# Patient Record
Sex: Female | Born: 1940 | Race: White | Hispanic: No | Marital: Married | Smoking: Never smoker
Health system: Southern US, Community
[De-identification: ages and names within clinical notes are randomized; demographics above are authoritative.]

## PROBLEM LIST (undated history)

## (undated) DIAGNOSIS — F419 Anxiety disorder, unspecified: Secondary | ICD-10-CM

## (undated) DIAGNOSIS — M1990 Unspecified osteoarthritis, unspecified site: Secondary | ICD-10-CM

## (undated) DIAGNOSIS — I639 Cerebral infarction, unspecified: Secondary | ICD-10-CM

## (undated) DIAGNOSIS — K219 Gastro-esophageal reflux disease without esophagitis: Secondary | ICD-10-CM

## (undated) DIAGNOSIS — Z8042 Family history of malignant neoplasm of prostate: Secondary | ICD-10-CM

## (undated) DIAGNOSIS — I10 Essential (primary) hypertension: Secondary | ICD-10-CM

## (undated) DIAGNOSIS — D638 Anemia in other chronic diseases classified elsewhere: Secondary | ICD-10-CM

## (undated) DIAGNOSIS — Z5189 Encounter for other specified aftercare: Secondary | ICD-10-CM

## (undated) DIAGNOSIS — D126 Benign neoplasm of colon, unspecified: Secondary | ICD-10-CM

## (undated) DIAGNOSIS — D649 Anemia, unspecified: Secondary | ICD-10-CM

## (undated) DIAGNOSIS — C3490 Malignant neoplasm of unspecified part of unspecified bronchus or lung: Secondary | ICD-10-CM

## (undated) DIAGNOSIS — C50919 Malignant neoplasm of unspecified site of unspecified female breast: Secondary | ICD-10-CM

## (undated) DIAGNOSIS — C182 Malignant neoplasm of ascending colon: Secondary | ICD-10-CM

## (undated) DIAGNOSIS — N189 Chronic kidney disease, unspecified: Secondary | ICD-10-CM

## (undated) DIAGNOSIS — F329 Major depressive disorder, single episode, unspecified: Secondary | ICD-10-CM

## (undated) DIAGNOSIS — Z800 Family history of malignant neoplasm of digestive organs: Secondary | ICD-10-CM

## (undated) DIAGNOSIS — E039 Hypothyroidism, unspecified: Secondary | ICD-10-CM

## (undated) DIAGNOSIS — Z803 Family history of malignant neoplasm of breast: Secondary | ICD-10-CM

## (undated) DIAGNOSIS — H409 Unspecified glaucoma: Secondary | ICD-10-CM

## (undated) DIAGNOSIS — I499 Cardiac arrhythmia, unspecified: Secondary | ICD-10-CM

## (undated) DIAGNOSIS — S72009A Fracture of unspecified part of neck of unspecified femur, initial encounter for closed fracture: Secondary | ICD-10-CM

## (undated) DIAGNOSIS — T7840XA Allergy, unspecified, initial encounter: Secondary | ICD-10-CM

## (undated) DIAGNOSIS — Z5111 Encounter for antineoplastic chemotherapy: Secondary | ICD-10-CM

## (undated) DIAGNOSIS — Z9013 Acquired absence of bilateral breasts and nipples: Secondary | ICD-10-CM

## (undated) DIAGNOSIS — Z808 Family history of malignant neoplasm of other organs or systems: Secondary | ICD-10-CM

## (undated) HISTORY — DX: Anemia, unspecified: D64.9

## (undated) HISTORY — PX: LUNG SURGERY: SHX703

## (undated) HISTORY — PX: RECONSTRUCTION BREAST W/ TRAM FLAP: SUR1079

## (undated) HISTORY — DX: Family history of malignant neoplasm of other organs or systems: Z80.8

## (undated) HISTORY — PX: MASTECTOMY: SHX3

## (undated) HISTORY — DX: Encounter for other specified aftercare: Z51.89

## (undated) HISTORY — PX: EYE SURGERY: SHX253

## (undated) HISTORY — DX: Gastro-esophageal reflux disease without esophagitis: K21.9

## (undated) HISTORY — DX: Allergy, unspecified, initial encounter: T78.40XA

## (undated) HISTORY — PX: APPENDECTOMY: SHX54

## (undated) HISTORY — PX: COLONOSCOPY: SHX174

## (undated) HISTORY — DX: Anemia in other chronic diseases classified elsewhere: D63.8

## (undated) HISTORY — DX: Cerebral infarction, unspecified: I63.9

## (undated) HISTORY — DX: Malignant neoplasm of ascending colon: C18.2

## (undated) HISTORY — DX: Fracture of unspecified part of neck of unspecified femur, initial encounter for closed fracture: S72.009A

## (undated) HISTORY — DX: Family history of malignant neoplasm of breast: Z80.3

## (undated) HISTORY — PX: TONSILLECTOMY: SUR1361

## (undated) HISTORY — DX: Malignant neoplasm of unspecified site of unspecified female breast: C50.919

## (undated) HISTORY — DX: Family history of malignant neoplasm of prostate: Z80.42

## (undated) HISTORY — DX: Encounter for antineoplastic chemotherapy: Z51.11

## (undated) HISTORY — DX: Malignant neoplasm of unspecified part of unspecified bronchus or lung: C34.90

## (undated) HISTORY — DX: Benign neoplasm of colon, unspecified: D12.6

## (undated) HISTORY — DX: Essential (primary) hypertension: I10

## (undated) HISTORY — PX: OTHER SURGICAL HISTORY: SHX169

---

## 2000-10-21 DIAGNOSIS — C50919 Malignant neoplasm of unspecified site of unspecified female breast: Secondary | ICD-10-CM

## 2000-10-21 HISTORY — DX: Malignant neoplasm of unspecified site of unspecified female breast: C50.919

## 2000-12-02 DIAGNOSIS — F32A Depression, unspecified: Secondary | ICD-10-CM

## 2000-12-02 HISTORY — DX: Depression, unspecified: F32.A

## 2002-01-05 DIAGNOSIS — E7800 Pure hypercholesterolemia, unspecified: Secondary | ICD-10-CM | POA: Diagnosis present

## 2009-10-06 DIAGNOSIS — Z800 Family history of malignant neoplasm of digestive organs: Secondary | ICD-10-CM | POA: Insufficient documentation

## 2009-10-06 HISTORY — DX: Family history of malignant neoplasm of digestive organs: Z80.0

## 2010-05-20 DIAGNOSIS — Z9013 Acquired absence of bilateral breasts and nipples: Secondary | ICD-10-CM

## 2010-05-20 HISTORY — DX: Acquired absence of bilateral breasts and nipples: Z90.13

## 2010-12-31 ENCOUNTER — Other Ambulatory Visit: Payer: Self-pay | Admitting: Thoracic Surgery (Cardiothoracic Vascular Surgery)

## 2010-12-31 ENCOUNTER — Encounter (INDEPENDENT_AMBULATORY_CARE_PROVIDER_SITE_OTHER): Payer: Medicare Other | Admitting: Thoracic Surgery (Cardiothoracic Vascular Surgery)

## 2010-12-31 DIAGNOSIS — D381 Neoplasm of uncertain behavior of trachea, bronchus and lung: Secondary | ICD-10-CM

## 2010-12-31 DIAGNOSIS — R918 Other nonspecific abnormal finding of lung field: Secondary | ICD-10-CM

## 2011-01-01 DIAGNOSIS — C3490 Malignant neoplasm of unspecified part of unspecified bronchus or lung: Secondary | ICD-10-CM | POA: Insufficient documentation

## 2011-01-01 NOTE — H&P (Signed)
HISTORY AND PHYSICAL EXAMINATION  December 31, 2010  Re:  Kristin [name], Kristin [name]         DOB:  03-18-1941  PRIMARY CARE PHYSICIAN:  Dr. [name].  REASON FOR CONSULTATION:  Left lung nodule.  HISTORY OF PRESENT ILLNESS:  The patient is a 70 year old female from [place], [place] who has been very healthy all of her life with exception of the fact that she was diagnosed with synchronous bilateral primary breast cancer approximately 10 years ago.  She apparently underwent bilateral mastectomy with TRAM flap reconstruction.  She tells [place] that she had one lymph node positive on one side and the other side was negative.  She did not undergo any adjuvant chemotherapy.  She has done quite well since then and has been followed on a regular basis by Dr. Melene Muller at [facility] of [place], [place].  On routine followup, her CA 27.29 titers were noted to be rising somewhat prompting CT scan of the chest, abdomen and pelvis which was performed December 20, 2010.  Chest CT scan revealed a 2 cm somewhat suspicious lesion located posteriorly in the left upper lobe.  No other significant pulmonary parenchymal lesions were identified.  The patient has been referred for thoracic surgical consultation.  REVIEW OF SYSTEMS:  GENERAL:  The patient reports normal appetite.  She is 5 feet 10 inches tall and weighs approximately 166 pounds.  Her weight is stable.  CARDIAC:  Negative.  The patient denies chest pain, chest tightness, chest pressure either with activity or rest.  The patient denies exertional shortness of breath.  RESPIRATORY:  Negative. The patient denies any chest pain on either side.  She denies any pleuritic pain or hemoptysis.  She denies any significant cough.  She has not had shortness of breath nor wheezing.  She denies any known exposure to patient's with tuberculosis or other significant respiratory pathogens.  GASTROINTESTINAL:  Negative.  The patient has  had no difficulty swallowing.  She reports normal bowel function. GENITOURINARY:  Negative.  PERIPHERAL VASCULAR:  Negative.  NEUROLOGIC: Negative.  MUSCULOSKELETAL:  Negative.  HEENT:  Negative.  PSYCHIATRIC: Negative.  INFECTIOUS:  Negative.  The patient does state that she had mild gastroenteritis a few weeks ago, but this resolved uneventfully. She has not had fevers or chills.  PAST MEDICAL HISTORY:  GE reflux disease and synchronous bilateral breast cancer, status post mastectomy in 2002.  The patient denies any known history of hypertension, myocardial infarction, congestive heart failure, diabetes, previous stroke.  PAST SURGICAL HISTORY: 1. Bilateral mastectomy with TRAM flap reconstruction. 2. Appendectomy with bilateral tubal ligation. 3. Tonsillectomy. 4. Bilateral cataract extraction.  FAMILY HISTORY:  Notable with multiple family members with multiple different types of cancer including a father who passed away from lung cancer.  He was a heavy smoker.  SOCIAL HISTORY:  The patient is married and lives with her husband in [place].  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  She has 2 grown children and 2 grown grandchildren.  She lives a somewhat sedentary lifestyle.  She states that she smoked a little bit when she was a teenager, but has essentially not smoked at all since 1963.  She denies any alcohol use.  CURRENT MEDICATIONS: 1. Femara 25 mg daily. 2. Boniva 150 mg monthly. 3. Lexapro 10 mg daily. 4. Omeprazole 20 mg daily. 5. Vitamin D weekly. 6. Calcium plus vitamin D twice daily.  DRUG ALLERGIES:  Sulfa causes rash.  PHYSICAL EXAMINATION:  General:  The patient  is a well-appearing female with blood pressure 130/87, pulse 76 and regular, oxygen saturation 96% on room air.  HEENT:  Unrevealing.  Neck:  Supple.  There is no palpable lymphadenopathy.  There are no carotid bruits.  Chest:  Auscultation of the chest reveals clear  breath sounds which are symmetrical bilaterally. No wheezes, rales or rhonchi are noted.  Cardiovascular:  Regular rate and rhythm.  No murmurs, rubs or gallops are noted.  Abdomen:  Soft, nondistended, nontender.  There are no palpable masses.  Extremities: Warm and well-perfused.  There is no lower extremity edema.  Distal pulses are thready, but palpable at the ankle.  There is no sign of significant venous insufficiency.  Rectal:  Deferred.  GU:  Deferred. Neurologic:  Grossly nonfocal and symmetrical throughout.  DIAGNOSTIC TESTS:  Chest CT scan performed December 20, 2010, at [facility] in [place], [place] is reviewed.  This demonstrates a small, non-calcified, somewhat spiculated-appearing multilobular lesion located posteriorly in the left upper lobe.  This measures 2.2 cm in his greatest transverse diameter.  It appears to come close to, but not involved the visceral pleura.  There are no other significant pulmonary parenchymal lesions.  There are no calcifications within this lesion. There are no pleural effusions.  There is no sign of any hilar or mediastinal lymphadenopathy.  The liver and adrenal glands appear normal.  No other abnormalities are noted.  IMPRESSION:  A 2 cm somewhat spiculated-appearing lesion located posteriorly in the left upper lobe in this nonsmoker with remote history of breast cancer.  Radiographic appearance of this does not appear consistent with metastatic disease, but certainly could be consistent with primary lung cancer.  The possibility of benign etiology must also be considered, but the radiographic appearance is worrisome enough to mandate aggressive management.  PLAN:  I have discussed options at length with the patient and her husband including:  1) proceeding directly to video-assisted thoracoscopic surgery for resection,  2) proceeding with PET CT scan to obtain further diagnostic information,  3) conservative followup  with repeat CT scan in 3 months, and 4) transthoracic needle aspiration biopsy.  Overall, I suspect it makes the most sense to proceed with PET CT scan.  If this lesion is not hypermetabolic on PET, it would be reasonable to consider close followup with repeat CT in 3 months, although negative PET CT scan would not guarantee that this is not a slow growing malignancy.  High uptake on PET would certainly mandate an aggressive approach and need to proceed with surgery.  Indeterminate uptake could be found and associated with an infectious process or slow growing malignancy.  I do not favor TTNA nor delayed follow up without at least getting a PET CT scan first.  We discussed options at length. The patient desires to proceed with PET CT scan imaging to obtain further diagnostic information.  We will contact her later this week after this scan has been completed and the results reported to make further plans.  All of her questions have been addressed.  Salvatore Decent. Cornelius Moras, M.D. Electronically Signed  CHO/MEDQ  D:  12/31/2010  T:  01/01/2011  Job:  [id number]  cc:   Tobie Poet, M.D. [name]

## 2011-01-03 ENCOUNTER — Encounter (HOSPITAL_COMMUNITY)
Admission: RE | Admit: 2011-01-03 | Discharge: 2011-01-03 | Disposition: A | Discharge status: Home / Self Care | Payer: Medicare Other | Source: Ambulatory Visit | Attending: Thoracic Surgery (Cardiothoracic Vascular Surgery) | Admitting: Thoracic Surgery (Cardiothoracic Vascular Surgery)

## 2011-01-03 DIAGNOSIS — K449 Diaphragmatic hernia without obstruction or gangrene: Secondary | ICD-10-CM | POA: Insufficient documentation

## 2011-01-03 DIAGNOSIS — R918 Other nonspecific abnormal finding of lung field: Secondary | ICD-10-CM

## 2011-01-03 DIAGNOSIS — J984 Other disorders of lung: Secondary | ICD-10-CM | POA: Insufficient documentation

## 2011-01-03 LAB — GLUCOSE, CAPILLARY: Glucose-Capillary: 104 mg/dL — ABNORMAL HIGH (ref 70–99)

## 2011-01-03 MED ORDER — FLUDEOXYGLUCOSE F - 18 (FDG) INJECTION
18.2000 | Freq: Once | INTRAVENOUS | Status: AC | PRN
  Administered 2011-01-03: 18.2 via INTRAVENOUS

## 2011-01-04 ENCOUNTER — Other Ambulatory Visit: Payer: Self-pay | Admitting: Thoracic Surgery (Cardiothoracic Vascular Surgery)

## 2011-01-04 DIAGNOSIS — C7931 Secondary malignant neoplasm of brain: Secondary | ICD-10-CM

## 2011-01-07 ENCOUNTER — Other Ambulatory Visit: Payer: Self-pay | Admitting: Thoracic Surgery (Cardiothoracic Vascular Surgery)

## 2011-01-07 ENCOUNTER — Ambulatory Visit (HOSPITAL_COMMUNITY)
Admission: RE | Admit: 2011-01-07 | Discharge: 2011-01-07 | Disposition: A | Discharge status: Home / Self Care | Payer: Medicare Other | Source: Ambulatory Visit | Attending: Thoracic Surgery (Cardiothoracic Vascular Surgery) | Admitting: Thoracic Surgery (Cardiothoracic Vascular Surgery)

## 2011-01-07 ENCOUNTER — Encounter (INDEPENDENT_AMBULATORY_CARE_PROVIDER_SITE_OTHER): Payer: Medicare Other | Admitting: Thoracic Surgery (Cardiothoracic Vascular Surgery)

## 2011-01-07 DIAGNOSIS — K449 Diaphragmatic hernia without obstruction or gangrene: Secondary | ICD-10-CM | POA: Insufficient documentation

## 2011-01-07 DIAGNOSIS — Z01811 Encounter for preprocedural respiratory examination: Secondary | ICD-10-CM | POA: Insufficient documentation

## 2011-01-07 DIAGNOSIS — G939 Disorder of brain, unspecified: Secondary | ICD-10-CM

## 2011-01-07 DIAGNOSIS — J438 Other emphysema: Secondary | ICD-10-CM | POA: Insufficient documentation

## 2011-01-07 DIAGNOSIS — D491 Neoplasm of unspecified behavior of respiratory system: Secondary | ICD-10-CM

## 2011-01-07 DIAGNOSIS — R222 Localized swelling, mass and lump, trunk: Secondary | ICD-10-CM | POA: Insufficient documentation

## 2011-01-07 DIAGNOSIS — I2789 Other specified pulmonary heart diseases: Secondary | ICD-10-CM | POA: Insufficient documentation

## 2011-01-07 DIAGNOSIS — C7931 Secondary malignant neoplasm of brain: Secondary | ICD-10-CM

## 2011-01-07 MED ORDER — GADOBENATE DIMEGLUMINE 529 MG/ML IV SOLN
15.0000 mL | Freq: Once | INTRAVENOUS | Status: AC
  Administered 2011-01-07: 15 mL via INTRAVENOUS

## 2011-01-08 ENCOUNTER — Other Ambulatory Visit (HOSPITAL_COMMUNITY): Payer: Self-pay

## 2011-01-08 ENCOUNTER — Encounter (HOSPITAL_COMMUNITY): Payer: Self-pay

## 2011-01-08 ENCOUNTER — Other Ambulatory Visit (HOSPITAL_COMMUNITY): Payer: Medicare Other

## 2011-01-08 ENCOUNTER — Ambulatory Visit (HOSPITAL_COMMUNITY)
Admission: RE | Admit: 2011-01-08 | Discharge: 2011-01-08 | Disposition: A | Discharge status: Home / Self Care | Payer: Medicare Other | Source: Ambulatory Visit | Attending: Thoracic Surgery (Cardiothoracic Vascular Surgery) | Admitting: Thoracic Surgery (Cardiothoracic Vascular Surgery)

## 2011-01-08 ENCOUNTER — Encounter (HOSPITAL_COMMUNITY)
Admission: RE | Admit: 2011-01-08 | Discharge: 2011-01-08 | Disposition: A | Discharge status: Home / Self Care | Payer: Medicare Other | Source: Ambulatory Visit | Attending: Thoracic Surgery (Cardiothoracic Vascular Surgery) | Admitting: Thoracic Surgery (Cardiothoracic Vascular Surgery)

## 2011-01-08 ENCOUNTER — Other Ambulatory Visit: Payer: Self-pay | Admitting: Thoracic Surgery (Cardiothoracic Vascular Surgery)

## 2011-01-08 DIAGNOSIS — J984 Other disorders of lung: Secondary | ICD-10-CM | POA: Insufficient documentation

## 2011-01-08 DIAGNOSIS — M899 Disorder of bone, unspecified: Secondary | ICD-10-CM | POA: Insufficient documentation

## 2011-01-08 DIAGNOSIS — Z01818 Encounter for other preprocedural examination: Secondary | ICD-10-CM | POA: Insufficient documentation

## 2011-01-08 DIAGNOSIS — R918 Other nonspecific abnormal finding of lung field: Secondary | ICD-10-CM

## 2011-01-08 DIAGNOSIS — G939 Disorder of brain, unspecified: Secondary | ICD-10-CM

## 2011-01-08 DIAGNOSIS — Z01810 Encounter for preprocedural cardiovascular examination: Secondary | ICD-10-CM | POA: Insufficient documentation

## 2011-01-08 DIAGNOSIS — J438 Other emphysema: Secondary | ICD-10-CM | POA: Insufficient documentation

## 2011-01-08 DIAGNOSIS — I2789 Other specified pulmonary heart diseases: Secondary | ICD-10-CM | POA: Insufficient documentation

## 2011-01-08 DIAGNOSIS — Q283 Other malformations of cerebral vessels: Secondary | ICD-10-CM | POA: Insufficient documentation

## 2011-01-08 DIAGNOSIS — K449 Diaphragmatic hernia without obstruction or gangrene: Secondary | ICD-10-CM | POA: Insufficient documentation

## 2011-01-08 DIAGNOSIS — Z01812 Encounter for preprocedural laboratory examination: Secondary | ICD-10-CM | POA: Insufficient documentation

## 2011-01-08 LAB — CBC
HCT: 35.4 % — ABNORMAL LOW (ref 36.0–46.0)
Hemoglobin: 11.4 g/dL — ABNORMAL LOW (ref 12.0–15.0)
MCH: 25.8 pg — ABNORMAL LOW (ref 26.0–34.0)
MCHC: 32.2 g/dL (ref 30.0–36.0)
MCV: 80.1 fL (ref 78.0–100.0)
RBC: 4.42 MIL/uL (ref 3.87–5.11)

## 2011-01-08 LAB — URINALYSIS, ROUTINE W REFLEX MICROSCOPIC
Bilirubin Urine: NEGATIVE
Glucose, UA: NEGATIVE mg/dL
Ketones, ur: NEGATIVE mg/dL
Protein, ur: NEGATIVE mg/dL
Specific Gravity, Urine: 1.008 (ref 1.005–1.030)

## 2011-01-08 LAB — COMPREHENSIVE METABOLIC PANEL
ALT: 15 U/L (ref 0–35)
AST: 22 U/L (ref 0–37)
Albumin: 4 g/dL (ref 3.5–5.2)
Alkaline Phosphatase: 71 U/L (ref 39–117)
CO2: 24 mEq/L (ref 19–32)
Chloride: 109 mEq/L (ref 96–112)
Creatinine, Ser: 0.86 mg/dL (ref 0.4–1.2)
GFR calc Af Amer: >60 mL/min (ref >60)
GFR calc non Af Amer: >60 mL/min (ref >60)
Potassium: 4.7 mEq/L (ref 3.5–5.1)
Sodium: 139 mEq/L (ref 135–145)
Total Bilirubin: 0.8 mg/dL (ref 0.3–1.2)

## 2011-01-08 LAB — BLOOD GAS, ARTERIAL
Acid-base deficit: 0.3 mmol/L (ref 0.0–2.0)
Drawn by: 206361
O2 Saturation: 96.3 %
TCO2: 25 mmol/L (ref 0–100)
pCO2 arterial: 38.7 mmHg (ref 35.0–45.0)
pO2, Arterial: 85.2 mmHg (ref 80.0–100.0)

## 2011-01-08 LAB — SURGICAL PCR SCREEN: MRSA, PCR: NEGATIVE

## 2011-01-08 LAB — APTT: aPTT: 27 seconds (ref 24–37)

## 2011-01-08 MED ORDER — IOHEXOL 300 MG/ML  SOLN
100.0000 mL | Freq: Once | INTRAMUSCULAR | Status: AC | PRN
  Administered 2011-01-08: 100 mL via INTRAVENOUS

## 2011-01-08 MED ORDER — TECHNETIUM TC 99M MEDRONATE IV KIT
25.0000 | PACK | Freq: Once | INTRAVENOUS | Status: AC | PRN
  Administered 2011-01-08: 21.5 via INTRAVENOUS

## 2011-01-08 NOTE — H&P (Signed)
HISTORY AND PHYSICAL EXAMINATION  January 07, 2011  Re:  Kristin [name], Kristin [name]         DOB:  02/23/1941  CHIEF COMPLAINTS:  Left lung nodule.  HISTORY OF PRESENT ILLNESS:  Kristin [name] is a 70 year old woman with a history of synchronous bilateral breast cancers 10 years ago.  She had done well since that time and had been followed on a regular basis. Recently on a routine followup, her CEA level was 27.29 and had been noted to be rising.  A CT of the chest was done and revealed a 1.7-cm left upper lobe nodule.  She was referred to Dr. Cornelius Moras for Thoracic Surgical consultation.  He discussed the options with her and recommended a PET CT to further characterize the nodule.  This turned out to be hypermetabolic on PET CT with an SUV of 4.8.  She also had some increased uptake with an SUV of about 2.4 in the left hilar nodes, although there was not any significant adenopathy in the hilum or the mediastinum by CT scan.  She also had pulmonary function testing done, which showed an FEV-1 of 2.34 which was 90% of predicted, FVC was 3.22 93% of predicted, ratio was 73%.  FEF25-75 was 75% predicted.  Lung capacities were 90-105% of predicted.  Her diffusion capacity was 70% of predicted.  Dr. Cornelius Moras had recommended that she undergo surgical resection for definitive diagnosis and treatment, but is currently in [facility] and therefore I asked the patient to follow up with me.  PAST MEDICAL HISTORY:  Significant for synchronous bilateral breast cancers 10 years ago, status post bilateral mastectomy and TRAM flap reconstruction in 2002.  Gastroesophageal reflux.  She denies any history of hypertension, myocardial infarction, or congestive heart failure.  CURRENT MEDICATIONS: 1. Femara 25 mg daily. 2. Boniva 150 mg monthly. 3. Lexapro 10 mg daily. 4. Omeprazole 20 mg daily. 5. Calcium plus vitamin D twice 600 mg tablets twice daily. 6. Vitamin D 50,000 units weekly.  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  FAMILY HISTORY:  Significant for multiple cancers.  SOCIAL HISTORY:  The patient is married.  She lives with her husband in [place].  She was retired.  She smoked for a couple of years as a teenager, but none since then.  Does not have any known radon exposure.  REVIEW OF SYSTEMS:  The patient feels well and really has no symptoms and no complaints, specifically denies any chest pain, shortness of weight loss, headaches, visual changes, or new bone or joint pains, all other systems were negative.  PHYSICAL EXAMINATION:  GENERAL:  Kristin [name] is a well-appearing 70 year old woman in no acute distress.  She is well-developed and well- nourished.  Neurologically she is intact. VITAL SIGNS:  Blood pressure 149/82, pulse 88, respirations are 20, ox saturation 96% on room air. HEENT:  Unremarkable. NECK:  Without thyromegaly, adenopathy or bruits. ABDOMEN:  She had previous surgery to the abdomen and chest. LUNGS:  Clear with equal breath sounds bilaterally. CARDIAC:  Has regular rate and rhythm. EXTREMITIES:  Without clubbing, cyanosis or edema.  LABORATORY DATA:  PFT as previously noted and PET scan as previously noted.  IMPRESSION:  Kristin [name] is a 70 year old woman who was found to have a 1.7 cm spiculated left upper lobe mass on PET scan, this is hypermetabolic as well as are being some mild increased activity in the left hilum, this is highly suspicious for primary lung cancer even though she is a  nonsmoker.  I discussed with her our diagnostic and therapeutic options.  She would potentially be a candidate for transthoracic needle aspiration or electromagnetic navigational bronchoscopic biopsy.  However, either of those procedures were negative with a 20% false negative rate, they could not be relied on for definitive prove that this is not intact lung cancer; therefore, I recommended her that we proceed with left VATS with wedge  resection for excisional biopsy with frozen section intraoperatively and then proceed with lobectomy for definitive surgical treatment of the lesion if it does not factor out to be lung cancer.  This would include a lymph node dissection of course at the same time.  We discussed the relative advantages and disadvantages of each of these approaches and she wishes to proceed with definitive diagnosis and treatment.  I discussed with her in detail the indications, risks, benefits and alternative treatments.  She understands the risks of surgery include but are not limited to death, MI, DVT, PE, bleeding, possible need for transfusions, infections, prolonged air leakage as well as other organ system dysfunction.  Overall, I think she is a very low risk candidate for surgery given her general state of good health and negligible smoking history.  I would expect her to be in the hospital probably around 4 days and to take between 4-6 weeks to recover postoperatively. Kristin [name] understands and accepts the risks and wishes to proceed.  As soon as possible, we are going to schedule her for surgery on Thursday, January 10, 2011.  She will be admitted at the day of surgery.  Salvatore Decent Dorris Fetch, M.D. Electronically Signed  SCH/MEDQ  D:  01/07/2011  T:  01/08/2011  Job:  [id number]  cc:   Salvatore Decent. Cornelius Moras, M.D. Dr. [name] Dr. Tobie Poet

## 2011-01-10 ENCOUNTER — Other Ambulatory Visit: Payer: Self-pay | Admitting: Thoracic Surgery (Cardiothoracic Vascular Surgery)

## 2011-01-10 ENCOUNTER — Inpatient Hospital Stay (HOSPITAL_COMMUNITY)
Admission: RE | Admit: 2011-01-10 | Discharge: 2011-01-14 | DRG: 164 | Disposition: A | Discharge status: Home / Self Care | Payer: Medicare Other | Source: Ambulatory Visit | Attending: Thoracic Surgery (Cardiothoracic Vascular Surgery) | Admitting: Thoracic Surgery (Cardiothoracic Vascular Surgery)

## 2011-01-10 ENCOUNTER — Inpatient Hospital Stay (HOSPITAL_COMMUNITY): Payer: Medicare Other

## 2011-01-10 DIAGNOSIS — C3410 Malignant neoplasm of upper lobe, unspecified bronchus or lung: Principal | ICD-10-CM | POA: Diagnosis present

## 2011-01-10 DIAGNOSIS — Z853 Personal history of malignant neoplasm of breast: Secondary | ICD-10-CM

## 2011-01-10 DIAGNOSIS — F411 Generalized anxiety disorder: Secondary | ICD-10-CM | POA: Diagnosis present

## 2011-01-10 DIAGNOSIS — D62 Acute posthemorrhagic anemia: Secondary | ICD-10-CM | POA: Diagnosis not present

## 2011-01-10 DIAGNOSIS — Z79899 Other long term (current) drug therapy: Secondary | ICD-10-CM

## 2011-01-10 DIAGNOSIS — C782 Secondary malignant neoplasm of pleura: Secondary | ICD-10-CM | POA: Diagnosis present

## 2011-01-10 DIAGNOSIS — K219 Gastro-esophageal reflux disease without esophagitis: Secondary | ICD-10-CM | POA: Diagnosis present

## 2011-01-11 ENCOUNTER — Inpatient Hospital Stay (HOSPITAL_COMMUNITY): Payer: Medicare Other

## 2011-01-11 LAB — CBC
Hemoglobin: 10.1 g/dL — ABNORMAL LOW (ref 12.0–15.0)
MCH: 26.3 pg (ref 26.0–34.0)
MCV: 81.8 fL (ref 78.0–100.0)
Platelets: 305 10*3/uL (ref 150–400)
RBC: 3.84 MIL/uL — ABNORMAL LOW (ref 3.87–5.11)
WBC: 7.8 10*3/uL (ref 4.0–10.5)

## 2011-01-11 LAB — POCT I-STAT 3, ART BLOOD GAS (G3+)
Bicarbonate: 26.3 mEq/L — ABNORMAL HIGH (ref 20.0–24.0)
TCO2: 28 mmol/L (ref 0–100)
pCO2 arterial: 47.7 mmHg — ABNORMAL HIGH (ref 35.0–45.0)
pH, Arterial: 7.349 — ABNORMAL LOW (ref 7.350–7.400)
pO2, Arterial: 85 mmHg (ref 80.0–100.0)

## 2011-01-11 LAB — BASIC METABOLIC PANEL
BUN: 7 mg/dL (ref 6–23)
CO2: 27 mEq/L (ref 19–32)
Chloride: 103 mEq/L (ref 96–112)
Creatinine, Ser: 0.82 mg/dL (ref 0.4–1.2)
Potassium: 4 mEq/L (ref 3.5–5.1)

## 2011-01-12 ENCOUNTER — Inpatient Hospital Stay (HOSPITAL_COMMUNITY): Payer: Medicare Other

## 2011-01-12 LAB — CBC
HCT: 28.8 % — ABNORMAL LOW (ref 36.0–46.0)
MCH: 25.6 pg — ABNORMAL LOW (ref 26.0–34.0)
MCV: 82.1 fL (ref 78.0–100.0)
RBC: 3.51 MIL/uL — ABNORMAL LOW (ref 3.87–5.11)
WBC: 6.2 10*3/uL (ref 4.0–10.5)

## 2011-01-12 LAB — COMPREHENSIVE METABOLIC PANEL
Alkaline Phosphatase: 49 U/L (ref 39–117)
BUN: 5 mg/dL — ABNORMAL LOW (ref 6–23)
CO2: 30 mEq/L (ref 19–32)
Chloride: 102 mEq/L (ref 96–112)
Creatinine, Ser: 0.82 mg/dL (ref 0.4–1.2)
GFR calc non Af Amer: >60 mL/min (ref >60)
Glucose, Bld: 106 mg/dL — ABNORMAL HIGH (ref 70–99)
Potassium: 4.2 mEq/L (ref 3.5–5.1)
Total Bilirubin: 0.5 mg/dL (ref 0.3–1.2)

## 2011-01-12 LAB — POCT I-STAT 3, ART BLOOD GAS (G3+)
Bicarbonate: 22.9 mEq/L (ref 20.0–24.0)
O2 Saturation: 96 %
Patient temperature: 97.7
TCO2: 24 mmol/L (ref 0–100)
pCO2 arterial: 43.6 mmHg (ref 35.0–45.0)
pH, Arterial: 7.326 — ABNORMAL LOW (ref 7.350–7.400)

## 2011-01-13 ENCOUNTER — Inpatient Hospital Stay (HOSPITAL_COMMUNITY): Payer: Medicare Other

## 2011-01-13 LAB — BASIC METABOLIC PANEL
Chloride: 101 mEq/L (ref 96–112)
Creatinine, Ser: 0.84 mg/dL (ref 0.4–1.2)
GFR calc Af Amer: >60 mL/min (ref >60)
Potassium: 4.1 mEq/L (ref 3.5–5.1)
Sodium: 138 mEq/L (ref 135–145)

## 2011-01-13 LAB — CBC
Hemoglobin: 10 g/dL — ABNORMAL LOW (ref 12.0–15.0)
MCH: 25.8 pg — ABNORMAL LOW (ref 26.0–34.0)
Platelets: 328 10*3/uL (ref 150–400)
RBC: 3.87 MIL/uL (ref 3.87–5.11)
WBC: 6.7 10*3/uL (ref 4.0–10.5)

## 2011-01-14 ENCOUNTER — Inpatient Hospital Stay (HOSPITAL_COMMUNITY): Payer: Medicare Other

## 2011-01-21 ENCOUNTER — Ambulatory Visit (INDEPENDENT_AMBULATORY_CARE_PROVIDER_SITE_OTHER): Payer: Self-pay

## 2011-01-21 DIAGNOSIS — C3490 Malignant neoplasm of unspecified part of unspecified bronchus or lung: Secondary | ICD-10-CM

## 2011-01-23 NOTE — Op Note (Signed)
NAMEDIANA, [name]             ACCOUNT NO.:  [id number]  MEDICAL RECORD NO.:  [id number]           PATIENT TYPE:  I  LOCATION:  2316                         FACILITY:  [facility]  PHYSICIAN:  Salvatore Decent. Dorris Fetch, M.D.DATE OF BIRTH:  26-Jun-1941  DATE OF PROCEDURE:  01/10/2011 DATE OF DISCHARGE:                              OPERATIVE REPORT   PREOPERATIVE DIAGNOSIS:  Left upper lobe mass.  POSTOPERATIVE DIAGNOSIS:  Stage IV adenocarcinoma of the lung.  SURGEON:  Salvatore Decent. Dorris Fetch, MD  ASSISTANT:  Coral Ceo, PA  ANESTHESIA:  General.  FINDINGS:  A 2-cm mass left upper lobe with visceral pleural contraction, 2-cm node at the bifurcation of the upper and lower lobe bronchi(Level 11), extensive tumor infiltration of parietal pleura.  Frozen section positive adenocarcinoma.  CLINICAL NOTE:  Ms. Shark is a 70 year old woman with past history of breast cancer.  She recently was found to have a 1.7-cm spiculated left upper lobe mass on PET scan.  This was hypermetabolic.  There was also some mildly increased activity in the left hilum.  She is a nonsmoker, but findings were suspicious for primary lung cancer.  She does have a past history of breast cancer, but has been disease free for 10 years. The patient was advised to undergo surgical resection with operative plan for the left VATS wedge resection followed by lobectomy if this was indeed a cancer.  The indications, risks, benefits, and alternatives were discussed in detail with the patient.  She understood and accepted risks and agreed to proceed.  OPERATIVE NOTE:  Ms. Mcglynn was brought to the preop holding area on January 10, 2011.  There the Anesthesia Service placed an arterial blood pressure monitoring line and central venous access was established.  PAS hose were placed for DVT prophylaxis.  Intravenous antibiotics were initiated.  The patient was taken to the operating room, anesthetized, and intubated with a  double-lumen endotracheal tube.  She was placed in a right lateral decubitus position and the left chest was prepped and draped in the usual fashion.  Single lung ventilation of the right lung was carried out and was tolerated well throughout the procedure.  An incision was made in the seventh intercostal space in the midaxillary line, was carried through the skin and subcutaneous tissue.  The chest was entered bluntly using hemostat.  Port was inserted and the thoracoscope was placed in the chest.  There was good isolation of the left lung.  A small incision approximately 5 cm in length was made in the anterior axillary line in the fifth intercostal space.  A separate stab incision was made below the tip of the scapula.  The upper lobe was inspected and the mass was noted.  There was puckering of the visceral pleura.  Also noted at this time, there was white plaquing of the parietal pleura primarily in the upper portion of the chest particularly around the apex.  There was no pleural effusion.  A wedge resection was performed of the left upper lobe mass with sequential firings of an Endo- GIA stapler.  The specimen was placed into an endoscopic retrieval bag and removed through  the anterior incision.  Biopsies were also taken of the parietal pleura.  Both specimens were sent for frozen section. While awaiting results of the frozen section, further inspection was carried out.  Inferior pulmonary ligament was divided and 11/9 lymph node was taken as a separate specimen.  The pleural reflection was divided along the medial aspect of the hilum.  The major fissure was completed with sequential firings of an Endo-GIA stapler.  At the bifurcation of the upper and lower lobe bronchi, there was an approximately 2-cm node.  This node was carefully dissected out and sent as a separate specimen.  More superiorly, there were some studding of the pleural surface of the mediastinum, small portion of  this was resected as this was dissected out level 5 lymph node was visible, appeared grossly normal.  It was taken as a specimen as well.  In the meantime, the frozen section returned positive for adenocarcinoma both with the primary lung mass as well as the parietal pleura.  This now was metastatic stage IV disease.  There was no indication for further surgical resection.  Chest was irrigated with warm saline.  Hemostasis was achieved.  A 28-French Blake drain was placed through a separate stab incision and placed posteriorly.  A 28-French right-angle standard chest tube was placed through the original port incision.  The lung was reinflated.  The anterior mini thoracotomy incision was closed with a 2- 0 Vicryl fascial suture followed by a 3-0 Vicryl subcuticular suture. The posterior port incision was closed in a similar fashion.  Chest tubes were placed to suction.  The patient was placed back in supine position.  She was extubated in the operating room and taken to the postanesthetic care unit in a good condition.     Salvatore Decent Dorris Fetch, M.D.     SCH/MEDQ  D:  01/10/2011  T:  01/11/2011  Job:  [id number]  Electronically Signed by Charlett Lango M.D. on 01/23/2011 11:55:57 AM

## 2011-01-24 ENCOUNTER — Encounter (INDEPENDENT_AMBULATORY_CARE_PROVIDER_SITE_OTHER): Payer: Medicare Other | Admitting: Internal Medicine

## 2011-01-24 DIAGNOSIS — C3490 Malignant neoplasm of unspecified part of unspecified bronchus or lung: Secondary | ICD-10-CM

## 2011-01-25 NOTE — Assessment & Plan Note (Signed)
OFFICE VISIT  BRINDY, HIGGINBOTHAM DOB:  1940-11-13                                        January 24, 2011 CHART #:  [id number]  REASON FOR VISIT:  Followup after recent lung surgery.  HISTORY OF PRESENT ILLNESS:  The patient is a 70 year old woman with past history of breast cancer.  She was recently found to have 1.7 cm spiculated mass in her left upper lobe.  There was some questionable adenopathy in the interlobar nodes.  She underwent surgical exploration on March 22 and was found to have stage IV disease.  She had extensive involvement of the parietal pleura and mediastinum.  This was adenocarcinoma eGFR positive.  She did well postoperatively and was discharged home without any complications.  She now returns for surgical followup as well as to discuss next step in treatment with Dr. Arbutus Ped from Oncology and Dr. Mitzi Hansen from Radiation Oncology.  She says that she has been doing well.  She has minimal discomfort.  She is anxious to regain full activities.  PHYSICAL EXAMINATION:  The patient is a well-appearing 70 year old female in no acute distress.  Blood pressure is 153/90, pulse 92, respirations 20, oxygen saturation 97% on room air.  Wounds are well healed.  Lungs are clear with equal breath sounds bilaterally.  IMPRESSION:  The patient is doing well at this point in time.  It is really unfortunate that she had metastatic disease what appeared on CT and PET scan to be stage I disease turned out to be stage IV.  She is going to need chemotherapy and possibly radiation therapy as well.  She is going to discuss those issues here today with Dr. Arbutus Ped and Dr. Mitzi Hansen and then decide whether she wants to do the treatment in [place] or to come back to [facility] for that. From a surgical standpoint she is doing well.  Her activities are unrestricted.  She can drive.  I will be happy to see her anytime if I can be of any assistance with her  care.  Salvatore Decent Dorris Fetch, M.D. Electronically Signed  SCH/MEDQ  D:  01/24/2011  T:  01/25/2011  Job:  [id number]  cc:   Salvatore Decent. Cornelius Moras, M.D. [name], MD Tobie Poet, MD Lajuana Matte, M.D.

## 2011-02-25 ENCOUNTER — Other Ambulatory Visit: Payer: Self-pay | Admitting: Internal Medicine

## 2011-02-25 ENCOUNTER — Encounter (HOSPITAL_BASED_OUTPATIENT_CLINIC_OR_DEPARTMENT_OTHER): Payer: Medicare Other | Admitting: Internal Medicine

## 2011-02-25 DIAGNOSIS — C3410 Malignant neoplasm of upper lobe, unspecified bronchus or lung: Secondary | ICD-10-CM

## 2011-02-25 LAB — COMPREHENSIVE METABOLIC PANEL
ALT: 11 U/L (ref 0–35)
AST: 16 U/L (ref 0–37)
Albumin: 4 g/dL (ref 3.5–5.2)
Calcium: 9.4 mg/dL (ref 8.4–10.5)
Chloride: 107 mEq/L (ref 96–112)
Potassium: 4.3 mEq/L (ref 3.5–5.3)

## 2011-02-25 LAB — CBC WITH DIFFERENTIAL/PLATELET
BASO%: 0.2 % (ref 0.0–2.0)
EOS%: 4.3 % (ref 0.0–7.0)
MCH: 26.3 pg (ref 25.1–34.0)
MCHC: 32.7 g/dL (ref 31.5–36.0)
RDW: 16.1 % — ABNORMAL HIGH (ref 11.2–14.5)
lymph#: 1.1 10*3/uL (ref 0.9–3.3)

## 2011-02-25 NOTE — Discharge Summary (Signed)
[name], [name]             ACCOUNT NO.:  [id number]  MEDICAL RECORD NO.:  [id number]           PATIENT TYPE:  O  LOCATION:  XRAY                         FACILITY:  [facility]  PHYSICIAN:  Salvatore Decent. Dorris Fetch, M.D.DATE OF BIRTH:  11-Nov-1940  DATE OF ADMISSION:  01/08/2011 DATE OF DISCHARGE:                              DISCHARGE SUMMARY   FINAL DIAGNOSIS:  Left upper lobe mass, stage IV adenocarcinoma of the lung.  SECONDARY DIAGNOSES: 1. 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 2. Gastroesophageal reflux disease.  IN-HOSPITAL OPERATIONS AND PROCEDURES:  Left video-assisted thoracoscopic surgery with wedge resection of left upper lobe, pleural biopsy, and lymph node sampling.  HISTORY AND PHYSICAL AND HOSPITAL COURSE:  Mr. Kristin [name] is a 70 year old female with a past history of breast cancer.  She recently was found to have a 1.7 cm spiculated left upper lobe mass on PET scan.  This was hypermetabolic.  There was also some mild increased activity in the left hilum.  The patient is a nonsmoker with findings that are suspicious for primary lung cancer.  The patient was seen and evaluated by Dr. Dorris Fetch.  Dr. Dorris Fetch discussed with the patient undergoing left VATS for wedge resection and possible lobectomy.  He discussed risks and benefits with the patient.  The patient also understanding agreed to proceed.  Surgery was scheduled for January 10, 2011.  For further details of the patient's past medical history and physical exam, please see dictated H and P.  The patient was taken to the operating room on January 10, 2011 and where she underwent left video-assisted thoracoscopic surgery with wedge resection of left upper lobe, pleural biopsy, and lymph node sampling. Pathology came back for invasive moderately differentiated adenocarcinoma extensively involving the pleural space  extending to the margin.  She had positive for adenocarcinoma on the pleural biopsy as well as lymph nodes positive for metastatic adenocarcinoma.  It was T3N2.  Postoperatively, the patient was able to be extubated without difficulty.  She was noted to be alert and oriented x4.  Neuro intact. She was noted to be hemodynamically stable postoperatively.  During the patient's postoperative course, daily chest x-rays were obtained.  Chest x-rays noted to be stable with no air leak noted.  Anterior chest tube was discontinued on postop day 1.  Remaining chest tube was placed to water seal on postop day #2.  Currently on chest x-ray obtained today January 13, 2011, there is no pneumothorax and x-ray is stable.  The patient has no air leak.  We will plan to remove the remaining chest tube at this point.  We will plan a followup PA and lateral chest x-ray in the a.m. During this time, the patient's vital signs have been followed.  She has remained afebrile with normal sinus rhythm and blood pressure stable.The patient was able to be weaned off oxygen pending O2 saturations are greater than 90% on room air.  She has been using her incentive spirometer.  The patient has been up and ambulating well without difficulty.  She is tolerating  the diet well.  No nausea or vomiting noted.  All incisions are clean, dry, and intact and healing well.  The patient's most recent lab work shows sodium of 138, potassium 4.1, chloride of 101, bicarbonate 31, BUN of 7, creatinine 0.84, and glucose 105.  White blood cell count 6.7, hemoglobin 10.3, hematocrit 32.0, and platelet count 320.  The patient is tentatively ready for discharge to home in the a.m. pending she remained stable.  FOLLOWUP APPOINTMENTS:  A followup appointment will be arranged with Dr. Dorris Fetch in 3 weeks.  Our office will contact the patient with this information.  The patient will need to obtain PA and lateral chest x-ray 45 minutes prior to  this appointment.  ACTIVITY:  The patient is instructed no driving until released to do so and no lifting over 10 pounds.  She is told to ambulate 3-4 times per day, progress as tolerated, and continue her breathing exercises.  INCISIONAL CARE:  The patient is told to shower washing her incisions using soap and water.  She is to contact the office if she develops any drainage or opening from any of her incision sites.  DIET:  The patient educated on diet to be low-fat, low-salt.  DISCHARGE MEDICATIONS: 1. Percocet 5/325 one to two tablets daily 4 hours p.r.n. pain. 2. Boniva 150 mg 1 tablet monthly. 3. Calcium carbonate/vitamin D 2 tablets daily. 4. Femara 2.5 mg daily. 5. Lexapro 10 mg daily. 6. Omeprazole 20 mg daily. 7. Vitamin D 50,000 units 1 capsule weekly.     Sol Blazing, PA   ______________________________ Salvatore Decent Dorris Fetch, M.D.    KMD/MEDQ  D:  01/13/2011  T:  01/13/2011  Job:  [id number]  cc:   [name], M.D. Tobie Poet, M.D.  Electronically Signed by Cameron Proud PA on 01/29/2011 10:49:30 AM Electronically Signed by Charlett Lango M.D. on 02/25/2011 02:28:09 PM

## 2011-03-11 ENCOUNTER — Other Ambulatory Visit: Payer: Self-pay | Admitting: Internal Medicine

## 2011-03-11 ENCOUNTER — Encounter (HOSPITAL_BASED_OUTPATIENT_CLINIC_OR_DEPARTMENT_OTHER): Payer: Medicare Other | Admitting: Internal Medicine

## 2011-03-11 DIAGNOSIS — C3490 Malignant neoplasm of unspecified part of unspecified bronchus or lung: Secondary | ICD-10-CM

## 2011-03-11 DIAGNOSIS — C3410 Malignant neoplasm of upper lobe, unspecified bronchus or lung: Secondary | ICD-10-CM

## 2011-03-11 LAB — COMPREHENSIVE METABOLIC PANEL
AST: 16 U/L (ref 0–37)
Albumin: 3.9 g/dL (ref 3.5–5.2)
Alkaline Phosphatase: 75 U/L (ref 39–117)
BUN: 14 mg/dL (ref 6–23)
Potassium: 4.1 mEq/L (ref 3.5–5.3)
Total Bilirubin: 0.5 mg/dL (ref 0.3–1.2)

## 2011-03-11 LAB — CBC WITH DIFFERENTIAL/PLATELET
BASO%: 1.3 % (ref 0.0–2.0)
EOS%: 5.7 % (ref 0.0–7.0)
HCT: 29.2 % — ABNORMAL LOW (ref 34.8–46.6)
MCH: 24.6 pg — ABNORMAL LOW (ref 25.1–34.0)
MCHC: 31.5 g/dL (ref 31.5–36.0)
MONO%: 12.2 % (ref 0.0–14.0)
NEUT%: 53.8 % (ref 38.4–76.8)
RDW: 15.1 % — ABNORMAL HIGH (ref 11.2–14.5)
lymph#: 1.3 10*3/uL (ref 0.9–3.3)

## 2011-04-08 ENCOUNTER — Ambulatory Visit (HOSPITAL_COMMUNITY)
Admission: RE | Admit: 2011-04-08 | Discharge: 2011-04-08 | Disposition: A | Discharge status: Home / Self Care | Payer: Medicare Other | Source: Ambulatory Visit | Attending: Internal Medicine | Admitting: Internal Medicine

## 2011-04-08 DIAGNOSIS — Z853 Personal history of malignant neoplasm of breast: Secondary | ICD-10-CM | POA: Insufficient documentation

## 2011-04-08 DIAGNOSIS — K5730 Diverticulosis of large intestine without perforation or abscess without bleeding: Secondary | ICD-10-CM | POA: Insufficient documentation

## 2011-04-08 DIAGNOSIS — C3490 Malignant neoplasm of unspecified part of unspecified bronchus or lung: Secondary | ICD-10-CM

## 2011-04-08 DIAGNOSIS — E042 Nontoxic multinodular goiter: Secondary | ICD-10-CM | POA: Insufficient documentation

## 2011-04-08 DIAGNOSIS — K7689 Other specified diseases of liver: Secondary | ICD-10-CM | POA: Insufficient documentation

## 2011-04-08 DIAGNOSIS — M948X9 Other specified disorders of cartilage, unspecified sites: Secondary | ICD-10-CM | POA: Insufficient documentation

## 2011-04-08 DIAGNOSIS — K449 Diaphragmatic hernia without obstruction or gangrene: Secondary | ICD-10-CM | POA: Insufficient documentation

## 2011-04-08 DIAGNOSIS — J984 Other disorders of lung: Secondary | ICD-10-CM | POA: Insufficient documentation

## 2011-04-08 DIAGNOSIS — Z9010 Acquired absence of unspecified breast and nipple: Secondary | ICD-10-CM | POA: Insufficient documentation

## 2011-04-08 DIAGNOSIS — Q619 Cystic kidney disease, unspecified: Secondary | ICD-10-CM | POA: Insufficient documentation

## 2011-04-08 MED ORDER — IOHEXOL 300 MG/ML  SOLN
100.0000 mL | Freq: Once | INTRAMUSCULAR | Status: AC | PRN
  Administered 2011-04-08: 100 mL via INTRAVENOUS

## 2011-04-11 ENCOUNTER — Other Ambulatory Visit: Payer: Self-pay | Admitting: Internal Medicine

## 2011-04-11 ENCOUNTER — Encounter (HOSPITAL_COMMUNITY)
Admission: RE | Admit: 2011-04-11 | Discharge: 2011-04-11 | Disposition: A | Discharge status: Home / Self Care | Payer: Medicare Other | Source: Ambulatory Visit | Attending: Internal Medicine | Admitting: Internal Medicine

## 2011-04-11 ENCOUNTER — Encounter (HOSPITAL_BASED_OUTPATIENT_CLINIC_OR_DEPARTMENT_OTHER): Payer: Medicare Other | Admitting: Internal Medicine

## 2011-04-11 DIAGNOSIS — C3410 Malignant neoplasm of upper lobe, unspecified bronchus or lung: Secondary | ICD-10-CM

## 2011-04-11 DIAGNOSIS — D649 Anemia, unspecified: Secondary | ICD-10-CM | POA: Insufficient documentation

## 2011-04-11 DIAGNOSIS — R5381 Other malaise: Secondary | ICD-10-CM

## 2011-04-11 LAB — CBC WITH DIFFERENTIAL/PLATELET
Basophils Absolute: 0 10*3/uL (ref 0.0–0.1)
Eosinophils Absolute: 0.2 10*3/uL (ref 0.0–0.5)
HCT: 25.3 % — ABNORMAL LOW (ref 34.8–46.6)
HGB: 8.1 g/dL — ABNORMAL LOW (ref 11.6–15.9)
MONO#: 0.7 10*3/uL (ref 0.1–0.9)
NEUT#: 3 10*3/uL (ref 1.5–6.5)
NEUT%: 58.6 % (ref 38.4–76.8)
RDW: 16.1 % — ABNORMAL HIGH (ref 11.2–14.5)
lymph#: 1.2 10*3/uL (ref 0.9–3.3)

## 2011-04-11 LAB — IRON AND TIBC
%SAT: 4 % — ABNORMAL LOW (ref 20–55)
Iron: 17 ug/dL — ABNORMAL LOW (ref 42–145)
UIBC: 399 ug/dL

## 2011-04-11 LAB — COMPREHENSIVE METABOLIC PANEL
ALT: 11 U/L (ref 0–35)
Alkaline Phosphatase: 79 U/L (ref 39–117)
CO2: 25 mEq/L (ref 19–32)
Creatinine, Ser: 0.88 mg/dL (ref 0.50–1.10)
Total Bilirubin: 0.4 mg/dL (ref 0.3–1.2)

## 2011-04-11 LAB — TYPE & CROSSMATCH - CHCC

## 2011-04-11 LAB — FERRITIN: Ferritin: 5 ng/mL — ABNORMAL LOW (ref 10–291)

## 2011-04-12 ENCOUNTER — Encounter (HOSPITAL_BASED_OUTPATIENT_CLINIC_OR_DEPARTMENT_OTHER): Payer: Medicare Other | Admitting: Internal Medicine

## 2011-04-12 DIAGNOSIS — D649 Anemia, unspecified: Secondary | ICD-10-CM

## 2011-04-12 DIAGNOSIS — C3410 Malignant neoplasm of upper lobe, unspecified bronchus or lung: Secondary | ICD-10-CM

## 2011-04-13 LAB — CROSSMATCH
Antibody Screen: NEGATIVE
Unit division: 0

## 2011-04-17 ENCOUNTER — Other Ambulatory Visit: Payer: Self-pay | Admitting: Internal Medicine

## 2011-04-17 DIAGNOSIS — D649 Anemia, unspecified: Secondary | ICD-10-CM

## 2011-04-17 LAB — FECAL OCCULT BLOOD, GUAIAC: Occult Blood: POSITIVE

## 2011-05-07 ENCOUNTER — Other Ambulatory Visit: Payer: Self-pay | Admitting: Internal Medicine

## 2011-05-07 ENCOUNTER — Encounter (HOSPITAL_BASED_OUTPATIENT_CLINIC_OR_DEPARTMENT_OTHER): Payer: Medicare Other | Admitting: Internal Medicine

## 2011-05-07 ENCOUNTER — Encounter: Payer: Self-pay | Admitting: Gastroenterology

## 2011-05-07 DIAGNOSIS — C3410 Malignant neoplasm of upper lobe, unspecified bronchus or lung: Secondary | ICD-10-CM

## 2011-05-07 LAB — COMPREHENSIVE METABOLIC PANEL
ALT: 17 U/L (ref 0–35)
AST: 18 U/L (ref 0–37)
Albumin: 3.7 g/dL (ref 3.5–5.2)
Alkaline Phosphatase: 70 U/L (ref 39–117)
Potassium: 4.1 mEq/L (ref 3.5–5.3)
Sodium: 139 mEq/L (ref 135–145)
Total Protein: 5.8 g/dL — ABNORMAL LOW (ref 6.0–8.3)

## 2011-05-07 LAB — CBC WITH DIFFERENTIAL/PLATELET
EOS%: 2.8 % (ref 0.0–7.0)
MCH: 25.3 pg (ref 25.1–34.0)
MCV: 78.4 fL — ABNORMAL LOW (ref 79.5–101.0)
MONO%: 13.6 % (ref 0.0–14.0)
NEUT#: 3.6 10*3/uL (ref 1.5–6.5)
RBC: 3.98 10*6/uL (ref 3.70–5.45)
RDW: 24.4 % — ABNORMAL HIGH (ref 11.2–14.5)

## 2011-06-04 ENCOUNTER — Other Ambulatory Visit: Payer: Self-pay | Admitting: Internal Medicine

## 2011-06-04 ENCOUNTER — Encounter (HOSPITAL_BASED_OUTPATIENT_CLINIC_OR_DEPARTMENT_OTHER): Payer: Medicare Other | Admitting: Internal Medicine

## 2011-06-04 DIAGNOSIS — R5381 Other malaise: Secondary | ICD-10-CM

## 2011-06-04 DIAGNOSIS — C3490 Malignant neoplasm of unspecified part of unspecified bronchus or lung: Secondary | ICD-10-CM

## 2011-06-04 DIAGNOSIS — D649 Anemia, unspecified: Secondary | ICD-10-CM

## 2011-06-04 DIAGNOSIS — C3410 Malignant neoplasm of upper lobe, unspecified bronchus or lung: Secondary | ICD-10-CM

## 2011-06-04 LAB — COMPREHENSIVE METABOLIC PANEL
Alkaline Phosphatase: 64 U/L (ref 39–117)
Glucose, Bld: 89 mg/dL (ref 70–99)
Sodium: 138 mEq/L (ref 135–145)
Total Bilirubin: 0.6 mg/dL (ref 0.3–1.2)
Total Protein: 6 g/dL (ref 6.0–8.3)

## 2011-06-04 LAB — CBC WITH DIFFERENTIAL/PLATELET
Basophils Absolute: 0 10*3/uL (ref 0.0–0.1)
EOS%: 4.3 % (ref 0.0–7.0)
MCH: 26.3 pg (ref 25.1–34.0)
MCV: 83.6 fL (ref 79.5–101.0)
MONO%: 13.8 % (ref 0.0–14.0)
RBC: 4.26 10*6/uL (ref 3.70–5.45)
RDW: 22.3 % — ABNORMAL HIGH (ref 11.2–14.5)

## 2011-06-13 ENCOUNTER — Ambulatory Visit (INDEPENDENT_AMBULATORY_CARE_PROVIDER_SITE_OTHER): Payer: Medicare Other | Admitting: Gastroenterology

## 2011-06-13 ENCOUNTER — Encounter: Payer: Self-pay | Admitting: Gastroenterology

## 2011-06-13 VITALS — BP 130/64 | HR 76 | Ht 67.0 in | Wt 159.4 lb

## 2011-06-13 DIAGNOSIS — Z853 Personal history of malignant neoplasm of breast: Secondary | ICD-10-CM | POA: Insufficient documentation

## 2011-06-13 DIAGNOSIS — K5730 Diverticulosis of large intestine without perforation or abscess without bleeding: Secondary | ICD-10-CM | POA: Insufficient documentation

## 2011-06-13 DIAGNOSIS — C3412 Malignant neoplasm of upper lobe, left bronchus or lung: Secondary | ICD-10-CM | POA: Insufficient documentation

## 2011-06-13 DIAGNOSIS — C3410 Malignant neoplasm of upper lobe, unspecified bronchus or lung: Secondary | ICD-10-CM

## 2011-06-13 DIAGNOSIS — Z800 Family history of malignant neoplasm of digestive organs: Secondary | ICD-10-CM | POA: Insufficient documentation

## 2011-06-13 DIAGNOSIS — K219 Gastro-esophageal reflux disease without esophagitis: Secondary | ICD-10-CM

## 2011-06-13 DIAGNOSIS — R195 Other fecal abnormalities: Secondary | ICD-10-CM | POA: Insufficient documentation

## 2011-06-13 MED ORDER — PEG-KCL-NACL-NASULF-NA ASC-C 100 G PO SOLR: 1.0000 | Freq: Once | ORAL | Status: DC

## 2011-06-13 NOTE — Patient Instructions (Signed)
Your procedure has been scheduled for 07/31/2011, please follow the seperate instructions.  Your prescription(s) have been sent to you pharmacy.

## 2011-06-13 NOTE — Progress Notes (Signed)
History of Present Illness:  This is a extremely nice 70 year old Caucasian female referred for evaluation of guaiac positive stools and iron deficiency anemia. The patient has chronic acid reflux and was on PPI therapy for many years, currently on twice a day Pepcid because of an interaction apparently with PPI therapy and her Tarceva  150 mg a day. Patient denies dysphagia, abdominal pain, bowel irregularity, melena, hematochezia, anorexia or weight loss. Recent CT scan of the abdomen showed diverticulosis but otherwise was unremarkable.  The patient has had previous lateral mastectomies for breast cancer, and this past spring had partial lung resection because of adenocarcinoma of the lung. At that time she had iron deficiency anemia which has responded to oral iron therapy. She did a 2 unit transfusion. Colonoscopy was performed by Dr. Tish Men in December of 22 and a negative exam except for very long, tortuous, and redundant colon. Patient has not had previous endoscopy. Her current reflux symptoms consist mostly of regurgitation and burning substernal chest pain. No history of known hepatitis or pancreatitis.  I have reviewed this patient's present history, medical and surgical past history, allergies and medications.     ROS: The remainder of the 10 point ROS is negative... apparently at the time of her surgery she had some type of laryngeal abnormality recognized by the anesthesiologist. The patient denies the current cardiovascular or pulmonary complaints. She follows a regular diet, and denies any food intolerances. She does not abuse NSAID's, and is not on aspirin therapy. Family history is remarkable for multiple type of carcinoma and a sister with colon cancer at age 97.     Physical Exam: General well developed well nourished patient in no acute distress, appearing her stated age Eyes PERRLA, no icterus, fundoscopic exam per opthamologist Skin no lesions noted Neck supple, no  adenopathy, no thyroid enlargement, no tenderness Chest clear to percussion and auscultation.Marland Kitchen absent breath sounds in the right upper lung field noted without wheezes or rhonchi. Heart no significant murmurs, gallops or rubs noted Abdomen no hepatosplenomegaly masses or tenderness, BS normal. Long linear lower abdominal scar related to previous reconstructive surgery.. Extremities no acute joint lesions, edema, phlebitis or evidence of cellulitis. Neurologic patient oriented x 3, cranial nerves intact, no focal neurologic deficits noted. Psychological mental status normal and normal affect.  Assessment and plan: Unfortunate patient with multiple primary carcinomas and a very strong family history of multiple types of carcinoma. She is high risk for colon cancer with her sister's history of colon cancer. Review of her previous colonoscopy report shows a very long and redundant colon, and I think would be advantageous to repeat this exam with propofol sedation. She also has chronic acid reflux, and is currently on H2 blocker therapy with good response. 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 She has followup CT scan of the abdomen and chest scheduled for September 8. I reviewed her CT scan from June 18 which otherwise was an unremarkable exam of her abdomen except for diverticulosis. Visualization of the colon was otherwise normal.  Encounter Diagnosis  Name Primary?  . Heme positive stool Yes

## 2011-06-28 ENCOUNTER — Encounter (HOSPITAL_BASED_OUTPATIENT_CLINIC_OR_DEPARTMENT_OTHER): Payer: Medicare Other | Admitting: Internal Medicine

## 2011-06-28 ENCOUNTER — Ambulatory Visit (HOSPITAL_COMMUNITY)
Admission: RE | Admit: 2011-06-28 | Discharge: 2011-06-28 | Disposition: A | Discharge status: Home / Self Care | Payer: Medicare Other | Source: Ambulatory Visit | Attending: Internal Medicine | Admitting: Internal Medicine

## 2011-06-28 ENCOUNTER — Encounter (HOSPITAL_COMMUNITY): Payer: Self-pay

## 2011-06-28 ENCOUNTER — Other Ambulatory Visit: Payer: Self-pay | Admitting: Internal Medicine

## 2011-06-28 DIAGNOSIS — M51379 Other intervertebral disc degeneration, lumbosacral region without mention of lumbar back pain or lower extremity pain: Secondary | ICD-10-CM | POA: Insufficient documentation

## 2011-06-28 DIAGNOSIS — G9389 Other specified disorders of brain: Secondary | ICD-10-CM | POA: Insufficient documentation

## 2011-06-28 DIAGNOSIS — D649 Anemia, unspecified: Secondary | ICD-10-CM

## 2011-06-28 DIAGNOSIS — D1802 Hemangioma of intracranial structures: Secondary | ICD-10-CM | POA: Insufficient documentation

## 2011-06-28 DIAGNOSIS — C3410 Malignant neoplasm of upper lobe, unspecified bronchus or lung: Secondary | ICD-10-CM

## 2011-06-28 DIAGNOSIS — C3490 Malignant neoplasm of unspecified part of unspecified bronchus or lung: Secondary | ICD-10-CM

## 2011-06-28 DIAGNOSIS — M8569 Other cyst of bone, multiple sites: Secondary | ICD-10-CM | POA: Insufficient documentation

## 2011-06-28 DIAGNOSIS — R5381 Other malaise: Secondary | ICD-10-CM

## 2011-06-28 DIAGNOSIS — R059 Cough, unspecified: Secondary | ICD-10-CM | POA: Insufficient documentation

## 2011-06-28 DIAGNOSIS — Z853 Personal history of malignant neoplasm of breast: Secondary | ICD-10-CM | POA: Insufficient documentation

## 2011-06-28 DIAGNOSIS — M5137 Other intervertebral disc degeneration, lumbosacral region: Secondary | ICD-10-CM | POA: Insufficient documentation

## 2011-06-28 DIAGNOSIS — K449 Diaphragmatic hernia without obstruction or gangrene: Secondary | ICD-10-CM | POA: Insufficient documentation

## 2011-06-28 DIAGNOSIS — M1610 Unilateral primary osteoarthritis, unspecified hip: Secondary | ICD-10-CM | POA: Insufficient documentation

## 2011-06-28 DIAGNOSIS — R05 Cough: Secondary | ICD-10-CM | POA: Insufficient documentation

## 2011-06-28 LAB — CBC WITH DIFFERENTIAL/PLATELET
Basophils Absolute: 0 10*3/uL (ref 0.0–0.1)
Eosinophils Absolute: 0.1 10*3/uL (ref 0.0–0.5)
HCT: 36.9 % (ref 34.8–46.6)
HGB: 12.3 g/dL (ref 11.6–15.9)
LYMPH%: 22.1 % (ref 14.0–49.7)
MCV: 85.3 fL (ref 79.5–101.0)
MONO%: 10.5 % (ref 0.0–14.0)
NEUT#: 3.2 10*3/uL (ref 1.5–6.5)
NEUT%: 64.6 % (ref 38.4–76.8)
Platelets: 390 10*3/uL (ref 145–400)
RBC: 4.32 10*6/uL (ref 3.70–5.45)

## 2011-06-28 LAB — COMPREHENSIVE METABOLIC PANEL
Alkaline Phosphatase: 78 U/L (ref 39–117)
BUN: 14 mg/dL (ref 6–23)
Glucose, Bld: 89 mg/dL (ref 70–99)
Total Bilirubin: 0.7 mg/dL (ref 0.3–1.2)

## 2011-06-28 MED ORDER — IOHEXOL 300 MG/ML  SOLN
100.0000 mL | Freq: Once | INTRAMUSCULAR | Status: AC | PRN
  Administered 2011-06-28: 100 mL via INTRAVENOUS

## 2011-06-28 MED ORDER — GADOBENATE DIMEGLUMINE 529 MG/ML IV SOLN
14.0000 mL | Freq: Once | INTRAVENOUS | Status: AC | PRN
  Administered 2011-06-28: 14 mL via INTRAVENOUS

## 2011-07-02 ENCOUNTER — Encounter (HOSPITAL_BASED_OUTPATIENT_CLINIC_OR_DEPARTMENT_OTHER): Payer: Medicare Other | Admitting: Internal Medicine

## 2011-07-02 DIAGNOSIS — C3410 Malignant neoplasm of upper lobe, unspecified bronchus or lung: Secondary | ICD-10-CM

## 2011-07-02 DIAGNOSIS — R5383 Other fatigue: Secondary | ICD-10-CM

## 2011-07-02 DIAGNOSIS — D649 Anemia, unspecified: Secondary | ICD-10-CM

## 2011-07-22 DIAGNOSIS — D126 Benign neoplasm of colon, unspecified: Secondary | ICD-10-CM

## 2011-07-22 HISTORY — DX: Benign neoplasm of colon, unspecified: D12.6

## 2011-07-29 ENCOUNTER — Other Ambulatory Visit: Payer: Self-pay | Admitting: Internal Medicine

## 2011-07-29 ENCOUNTER — Encounter (HOSPITAL_BASED_OUTPATIENT_CLINIC_OR_DEPARTMENT_OTHER): Payer: Medicare Other | Admitting: Internal Medicine

## 2011-07-29 DIAGNOSIS — C3410 Malignant neoplasm of upper lobe, unspecified bronchus or lung: Secondary | ICD-10-CM

## 2011-07-29 DIAGNOSIS — R5381 Other malaise: Secondary | ICD-10-CM

## 2011-07-29 DIAGNOSIS — D649 Anemia, unspecified: Secondary | ICD-10-CM

## 2011-07-29 LAB — CBC WITH DIFFERENTIAL/PLATELET
Basophils Absolute: 0 10*3/uL (ref 0.0–0.1)
Eosinophils Absolute: 0.2 10*3/uL (ref 0.0–0.5)
HCT: 37.6 % (ref 34.8–46.6)
HGB: 12.5 g/dL (ref 11.6–15.9)
LYMPH%: 24.3 % (ref 14.0–49.7)
MCV: 87.5 fL (ref 79.5–101.0)
MONO%: 10.9 % (ref 0.0–14.0)
NEUT#: 2.7 10*3/uL (ref 1.5–6.5)
NEUT%: 58.9 % (ref 38.4–76.8)
Platelets: 415 10*3/uL — ABNORMAL HIGH (ref 145–400)

## 2011-07-29 LAB — COMPREHENSIVE METABOLIC PANEL
Albumin: 4.2 g/dL (ref 3.5–5.2)
Alkaline Phosphatase: 83 U/L (ref 39–117)
BUN: 14 mg/dL (ref 6–23)
Glucose, Bld: 91 mg/dL (ref 70–99)
Potassium: 3.9 mEq/L (ref 3.5–5.3)
Total Bilirubin: 0.6 mg/dL (ref 0.3–1.2)

## 2011-07-31 ENCOUNTER — Encounter: Payer: Self-pay | Admitting: Gastroenterology

## 2011-07-31 ENCOUNTER — Other Ambulatory Visit: Payer: Self-pay | Admitting: *Deleted

## 2011-07-31 ENCOUNTER — Ambulatory Visit (AMBULATORY_SURGERY_CENTER): Payer: Medicare Other | Admitting: Gastroenterology

## 2011-07-31 DIAGNOSIS — K449 Diaphragmatic hernia without obstruction or gangrene: Secondary | ICD-10-CM | POA: Insufficient documentation

## 2011-07-31 DIAGNOSIS — K5730 Diverticulosis of large intestine without perforation or abscess without bleeding: Secondary | ICD-10-CM

## 2011-07-31 DIAGNOSIS — K219 Gastro-esophageal reflux disease without esophagitis: Secondary | ICD-10-CM | POA: Insufficient documentation

## 2011-07-31 DIAGNOSIS — D126 Benign neoplasm of colon, unspecified: Secondary | ICD-10-CM

## 2011-07-31 DIAGNOSIS — R195 Other fecal abnormalities: Secondary | ICD-10-CM

## 2011-07-31 MED ORDER — ESOMEPRAZOLE MAGNESIUM 40 MG PO CPDR: DELAYED_RELEASE_CAPSULE | ORAL | Status: DC

## 2011-07-31 MED ORDER — SODIUM CHLORIDE 0.9 % IV SOLN: 500.0000 mL | INTRAVENOUS | Status: DC

## 2011-07-31 NOTE — Progress Notes (Signed)
Kristin Bailey, CRNA will administer propofol to sedate the pt. Maw  Pt tolerated the colonoscopy exam very well. Maw  Pt tolerated the EGD very well. maw

## 2011-07-31 NOTE — Patient Instructions (Signed)
Please refer to the blue and neon green sheets for instructions regarding diet and activity for the rest of today. Please refer to the neon green sheet for instructions regarding activity for the rest of today. Please refer to the neon green sheet for instructions regarding activity for the rest of today.

## 2011-08-01 ENCOUNTER — Telehealth: Payer: Self-pay | Admitting: *Deleted

## 2011-08-01 NOTE — Telephone Encounter (Signed)

## 2011-08-06 ENCOUNTER — Encounter: Payer: Self-pay | Admitting: Gastroenterology

## 2011-08-26 ENCOUNTER — Other Ambulatory Visit: Payer: Self-pay | Admitting: Physician Assistant

## 2011-08-26 DIAGNOSIS — C3410 Malignant neoplasm of upper lobe, unspecified bronchus or lung: Secondary | ICD-10-CM

## 2011-08-27 ENCOUNTER — Other Ambulatory Visit (HOSPITAL_BASED_OUTPATIENT_CLINIC_OR_DEPARTMENT_OTHER): Payer: Medicare Other | Admitting: Lab

## 2011-08-27 ENCOUNTER — Telehealth: Payer: Self-pay | Admitting: Internal Medicine

## 2011-08-27 ENCOUNTER — Other Ambulatory Visit: Payer: Self-pay | Admitting: Physician Assistant

## 2011-08-27 ENCOUNTER — Ambulatory Visit (HOSPITAL_BASED_OUTPATIENT_CLINIC_OR_DEPARTMENT_OTHER): Payer: Medicare Other | Admitting: Physician Assistant

## 2011-08-27 VITALS — BP 187/75 | HR 69 | Ht 67.0 in | Wt 158.9 lb

## 2011-08-27 DIAGNOSIS — C3410 Malignant neoplasm of upper lobe, unspecified bronchus or lung: Secondary | ICD-10-CM

## 2011-08-27 DIAGNOSIS — R5381 Other malaise: Secondary | ICD-10-CM

## 2011-08-27 DIAGNOSIS — D649 Anemia, unspecified: Secondary | ICD-10-CM

## 2011-08-27 LAB — CBC WITH DIFFERENTIAL/PLATELET
Basophils Absolute: 0 10*3/uL (ref 0.0–0.1)
Eosinophils Absolute: 0.1 10*3/uL (ref 0.0–0.5)
HGB: 12.3 g/dL (ref 11.6–15.9)
MCV: 88.8 fL (ref 79.5–101.0)
MONO%: 10.9 % (ref 0.0–14.0)
NEUT#: 4 10*3/uL (ref 1.5–6.5)
RDW: 14.7 % — ABNORMAL HIGH (ref 11.2–14.5)

## 2011-08-27 LAB — COMPREHENSIVE METABOLIC PANEL
ALT: 18 U/L (ref 0–35)
Albumin: 4.2 g/dL (ref 3.5–5.2)
Alkaline Phosphatase: 86 U/L (ref 39–117)
BUN: 16 mg/dL (ref 6–23)
CO2: 25 mEq/L (ref 19–32)
Calcium: 9.8 mg/dL (ref 8.4–10.5)
Chloride: 104 mEq/L (ref 96–112)
Glucose, Bld: 94 mg/dL (ref 70–99)
Potassium: 4.5 mEq/L (ref 3.5–5.3)
Sodium: 138 mEq/L (ref 135–145)
Total Protein: 6.4 g/dL (ref 6.0–8.3)

## 2011-08-27 NOTE — Telephone Encounter (Signed)
gve the pt her dec 2012 appt calendar along with the ct scan appt with instructions. °

## 2011-08-27 NOTE — Progress Notes (Signed)
Hematology and Oncology Follow Up Visit  Kristin [name] [id number] 01/19/1941 70 y.o. 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 She reports that she receive a flu vaccine as well as a whooping cough vaccine at the [facility] in [place] and October. She is tolerating her Tarceva relatively well with no episodes of diarrhea or rash. She does have mild dry skin related to the Tarceva but is treating this with a topical emollients with good relief. She is currently taking her Tarceva in the morning but did not realize that she was not supposed to take it in close proximity to her Pepcid. She plans to change to taking her Tarceva in the evenings 2 hours after her dinner. She will take her other medications in the mornings. She has noticed an with the change in weather of some blood in her nose when she blows her nose. She's not had any sinus pressure or pain. She was not complaints today.  Medications: I have reviewed the patient's current medications.  Allergies:  Allergies  Allergen Reactions  . Sulfa Antibiotics Rash    Past Medical History, Surgical history, Social history, and Family History were reviewed and updated.  Review of Systems: Constitutional:  Negative for fever, chills, night sweats, anorexia, weight loss, pain. Cardiovascular: negative Respiratory: negative Neurological: negative Dermatological: positive for dry skin ENT: negative Skin Gastrointestinal: no abdominal pain, change in bowel habits, or black or bloody stools Genito-Urinary: negative Hematological and Lymphatic: negative Breast: negative Musculoskeletal: negative Remaining ROS  negative.  Physical Exam: Blood pressure 187/75, pulse 69, height 5\' 7"  (1.702 m), weight 158 lb 14.4 oz (72.077 kg). manual recheck of the BP 154/84 ECOG: 1 General appearance: alert, cooperative and no distress Head: Normocephalic, without obvious abnormality, atraumatic Mouth:no evidence of thrush or mucositis Neck: no adenopathy, no carotid bruit, no JVD, supple, symmetrical, trachea midline and thyroid not enlarged, symmetric, no tenderness/mass/nodules Lymph nodes: Cervical, supraclavicular, and axillary nodes normal. Resp: clear to auscultation bilaterally and normal percussion bilaterally Cardio: regular rate and rhythm, S1, S2 normal, no murmur, click, rub or gallop GI: soft, non-tender; bowel sounds normal; no masses,  no organomegaly Extremities: extremities normal, atraumatic, no cyanosis or edema    Lab Results: Lab Results  Component Value Date   WBC 6.7 01/13/2011   HGB 12.3 08/27/2011   HCT 36.9 08/27/2011   MCV 88.8 08/27/2011   PLT 427* 08/27/2011     Chemistry      Component Value Date/Time   NA 142 07/29/2011 0913   K 3.9 07/29/2011 0913   CL 107 07/29/2011 0913   CO2 24 07/29/2011 0913   BUN 14 07/29/2011 0913   CREATININE 0.81 07/29/2011 0913      Component Value Date/Time   CALCIUM 9.5 07/29/2011 0913   ALKPHOS 83 07/29/2011 0913   AST 20 07/29/2011 0913   ALT 17 07/29/2011 0913   BILITOT 0.6 07/29/2011 0913       Radiological Studies: chest X-ray None   Impression and Plan: Severe pleasant 70 year old white female with metastatic non-small cell lung cancer with positive EGFR mutation at exon 21. She's now status post 7 months of therapy with Tarceva at 150 mg by mouth daily. She is  tolerating his therapy relatively well. She will continue with the Tarceva at 150 mg by mouth daily and followup in one month to see Dr. Gwenyth Bouillon with a repeat CBC differential see med and CT of the chest abdomen and pelvis with contrast to reevaluate her disease.  Spent more  than half the time coordinating care.    Conni Slipper, PA-C 11/6/201211:05 AM

## 2011-09-09 ENCOUNTER — Other Ambulatory Visit: Payer: Self-pay | Admitting: Physician Assistant

## 2011-09-09 DIAGNOSIS — C3490 Malignant neoplasm of unspecified part of unspecified bronchus or lung: Secondary | ICD-10-CM

## 2011-09-24 ENCOUNTER — Other Ambulatory Visit (HOSPITAL_BASED_OUTPATIENT_CLINIC_OR_DEPARTMENT_OTHER): Payer: Medicare Other

## 2011-09-24 ENCOUNTER — Ambulatory Visit (HOSPITAL_COMMUNITY)
Admission: RE | Admit: 2011-09-24 | Discharge: 2011-09-24 | Disposition: A | Discharge status: Home / Self Care | Payer: Medicare Other | Source: Ambulatory Visit | Attending: Physician Assistant | Admitting: Physician Assistant

## 2011-09-24 ENCOUNTER — Encounter (HOSPITAL_COMMUNITY): Payer: Self-pay

## 2011-09-24 DIAGNOSIS — C3410 Malignant neoplasm of upper lobe, unspecified bronchus or lung: Secondary | ICD-10-CM

## 2011-09-24 DIAGNOSIS — K449 Diaphragmatic hernia without obstruction or gangrene: Secondary | ICD-10-CM | POA: Insufficient documentation

## 2011-09-24 DIAGNOSIS — Z853 Personal history of malignant neoplasm of breast: Secondary | ICD-10-CM | POA: Insufficient documentation

## 2011-09-24 DIAGNOSIS — C3490 Malignant neoplasm of unspecified part of unspecified bronchus or lung: Secondary | ICD-10-CM | POA: Insufficient documentation

## 2011-09-24 DIAGNOSIS — D259 Leiomyoma of uterus, unspecified: Secondary | ICD-10-CM | POA: Insufficient documentation

## 2011-09-24 DIAGNOSIS — K7689 Other specified diseases of liver: Secondary | ICD-10-CM | POA: Insufficient documentation

## 2011-09-24 DIAGNOSIS — D649 Anemia, unspecified: Secondary | ICD-10-CM

## 2011-09-24 DIAGNOSIS — R5381 Other malaise: Secondary | ICD-10-CM

## 2011-09-24 LAB — CBC WITH DIFFERENTIAL/PLATELET
BASO%: 0.4 % (ref 0.0–2.0)
EOS%: 1.8 % (ref 0.0–7.0)
HCT: 36.5 % (ref 34.8–46.6)
LYMPH%: 19.9 % (ref 14.0–49.7)
MCH: 29.5 pg (ref 25.1–34.0)
MCHC: 33 g/dL (ref 31.5–36.0)
MCV: 89.3 fL (ref 79.5–101.0)
MONO#: 0.7 10*3/uL (ref 0.1–0.9)
MONO%: 11.1 % (ref 0.0–14.0)
NEUT%: 66.8 % (ref 38.4–76.8)
Platelets: 426 10*3/uL — ABNORMAL HIGH (ref 145–400)
RBC: 4.09 10*6/uL (ref 3.70–5.45)
WBC: 6.4 10*3/uL (ref 3.9–10.3)

## 2011-09-24 LAB — COMPREHENSIVE METABOLIC PANEL
ALT: 16 U/L (ref 0–35)
Alkaline Phosphatase: 83 U/L (ref 39–117)
CO2: 23 mEq/L (ref 19–32)
Creatinine, Ser: 0.93 mg/dL (ref 0.50–1.10)
Total Bilirubin: 0.6 mg/dL (ref 0.3–1.2)

## 2011-09-24 MED ORDER — IOHEXOL 300 MG/ML  SOLN
100.0000 mL | Freq: Once | INTRAMUSCULAR | Status: AC | PRN
  Administered 2011-09-24: 100 mL via INTRAVENOUS

## 2011-09-30 ENCOUNTER — Ambulatory Visit (HOSPITAL_BASED_OUTPATIENT_CLINIC_OR_DEPARTMENT_OTHER): Payer: Medicare Other | Admitting: Internal Medicine

## 2011-09-30 VITALS — BP 150/66 | HR 63 | Temp 99.2°F | Ht 67.0 in | Wt 154.7 lb

## 2011-09-30 DIAGNOSIS — R05 Cough: Secondary | ICD-10-CM

## 2011-09-30 DIAGNOSIS — C3410 Malignant neoplasm of upper lobe, unspecified bronchus or lung: Secondary | ICD-10-CM

## 2011-09-30 NOTE — Progress Notes (Signed)
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  Prior Therapy: None.  Current therapy: Tarceva at 150 mg by mouth daily status post 8months therapy.   INTERVAL HISTORY: Kristin [name] 70 y.o. female returns to the clinic today for followup visit accompanied by her daughter-in-law. The patient is feeling fine no specific complaints except for mild skin rash as well as few episodes of diarrhea. Otherwise she is tolerating her treatment withrceva fairly well. She has repeat CT scan of the chest, abdomen and pelvis performed recently and the patient is here today for evaluation and discussion of her scan results. She denied having any significant chest pain or shortness breathut she has mild cough secondary to postnasal drainage  MEDICAL HISTORY: Past Medical History  Diagnosis Date  . Hypertension   . Anemia   . Breast cancer 2002  . Lung cancer dx'd 12/2010    ALLERGIES:  is allergic to sulfa antibiotics.  MEDICATIONS:  Current Outpatient Prescriptions  Medication Sig Dispense Refill  . amLODipine (NORVASC) 5 MG tablet Take 5 mg by mouth daily.        . Calcium Carb-Cholecalciferol (RA CALCIUM 600/VITAMIN D-3) 600-400 MG-UNIT TABS Take 2 tablets by mouth daily.        Marland Kitchen dextromethorphan (DELSYM) 30 MG/5ML liquid Take 60 mg by mouth as needed.        Marland Kitchen escitalopram (LEXAPRO) 10 MG tablet Take 10 mg by mouth daily.        . famotidine (PEPCID) 40 MG tablet Take 40 mg by mouth daily.        . Fe Fum-FePoly-FA-Vit C-Vit B3 (FOLIVANE-F PO) Take 1 capsule by mouth daily.        Marland Kitchen FeFum-FePoly-FA-B Cmp-C-Biot (FOLIVANE-PLUS) CAPS TAKE 1 CAPSULE BY MOUTH DAILY  30 capsule  3  . ibandronate (BONIVA) 150 MG tablet Take 150 mg by mouth every 30 (thirty) days. Take in the morning with a  full glass of water, on an empty stomach, and do not take anything else by mouth or lie down for the next 30 min.       Marland Kitchen letrozole (FEMARA) 2.5 MG tablet Take 2.5 mg by mouth daily.        Marland Kitchen TARCEVA 150 MG tablet       . esomeprazole (NEXIUM) 40 MG capsule Take 1 capsule 30 min before breakfast.  30 capsule  11    SURGICAL HISTORY:  Past Surgical History  Procedure Date  . Appendectomy   . Mastectomy     bilateral  . Lung surgery   . Cataracts     bilateral  . Colonoscopy     REVIEW OF SYSTEMS:  A comprehensive review of systems was negative except for: Respiratory: positive for cough   PHYSICAL EXAMINATION: General appearance: alert, cooperative and no distress Head: Normocephalic, without obvious abnormality, atraumatic Neck: no adenopathy Lymph nodes: Cervical, supraclavicular, and axillary nodes normal. Resp: clear to auscultation bilaterally Cardio: regular rate and rhythm, S1, S2 normal, no murmur, click, rub or gallop GI: soft, non-tender; bowel sounds normal; no masses,  no organomegaly Extremities: extremities normal, atraumatic, no cyanosis or edema Neurologic: Alert and oriented X 3, normal strength and tone. Normal symmetric reflexes. Normal coordination and gait  ECOG PERFORMANCE STATUS: 1 - Symptomatic but completely ambulatory  Blood pressure 150/66, pulse 63, temperature 99.2  F (37.3 C), temperature source Oral, height 5\' 7"  (1.702 m), weight 154 lb 11.2 oz (70.171 kg).  LABORATORY DATA: Lab Results  Component Value Date   WBC 6.4 09/24/2011   HGB 12.1 09/24/2011   HCT 36.5 09/24/2011   MCV 89.3 09/24/2011   PLT 426* 09/24/2011      Chemistry      Component Value Date/Time   NA 139 09/24/2011 1155   K 4.1 09/24/2011 1155   CL 104 09/24/2011 1155   CO2 23 09/24/2011 1155   BUN 19 09/24/2011 1155   CREATININE 0.93 09/24/2011 1155      Component Value Date/Time   CALCIUM 9.2 09/24/2011 1155   ALKPHOS 83 09/24/2011 1155   AST 18 09/24/2011 1155   ALT 16  09/24/2011 1155   BILITOT 0.6 09/24/2011 1155       RADIOGRAPHIC STUDIES: Ct Chest W Contrast  09/24/2011  *RADIOLOGY REPORT*  Clinical Data:  Follow-up lung carcinoma.  Previous breast cancer. Currently undergoing chemotherapy.  CT CHEST, ABDOMEN AND PELVIS WITH CONTRAST  Technique:  Multidetector CT imaging of the chest, abdomen and pelvis was performed following the standard protocol during bolus administration of intravenous contrast.  Contrast: OMNIPAQUE IOHEXOL 300 MG/ML IV SOLN  Comparison:  06/28/2011  CT CHEST  Findings:  Postsurgical changes in the left upper lung field remains stable.  Large hiatal hernia is again demonstrated.  No other mediastinal or hilar soft tissue masses are identified.  No evidence of lymphadenopathy elsewhere within the thorax.  Tiny sub- centimeter thyroid nodules remains stable.  No suspicious pulmonary nodules or masses are identified.  There is no evidence of pulmonary infiltrate.  No endobronchial lesion identified.  No evidence of pleural or pericardial effusion.  No suspicious bone lesions are identified.  IMPRESSION:  1.  Stable exam.  No evidence of recurrent carcinoma or metastatic disease within the thorax. 2.  Large hiatal hernia.    CT ABDOMEN AND PELVIS  Findings:  Small right hepatic lobe cyst remains stable.  No liver masses are identified.  Gallbladder is unremarkable.  The spleen, pancreas, adrenal glands, and kidneys are also normal in appearance.  Tiny partially calcified uterine fibroids remains stable.  No other soft tissue masses or lymphadenopathy identified within the abdomen or pelvis.  No evidence of inflammatory process or ascites.  No suspicious bone lesions are identified.  IMPRESSION: Stable exam.  No evidence of metastatic disease or other acute findings within the abdomen or pelvis.  Original Report Authenticated By: Danae Orleans, M.D.    ASSESSMENT: This is a very pleasant 70 years old white female with metastatic non-small cell  lung cancer, adenocarcinoma with positive EGFR mutation currently on treatment with Tarceva. The patient is tolerating her treatment fairly well and she has no evidence for disease progression on her recent scan. I discussed the scan results with the patient and her daughter-in-law.   PLAN: I recommended for her to continue treatment with Tarceva at the current dose. The patient would come back for followup visit in one month for reevaluation.  All questions were answered. The patient knows to call the clinic with any problems, questions or concerns. We can certainly see the patient much sooner if necessary.

## 2011-10-02 ENCOUNTER — Telehealth: Payer: Self-pay | Admitting: Internal Medicine

## 2011-10-02 NOTE — Telephone Encounter (Signed)
S/w the pt and she is aware of her jan 2013 appts °

## 2011-10-08 ENCOUNTER — Other Ambulatory Visit: Payer: Self-pay | Admitting: Physician Assistant

## 2011-10-08 ENCOUNTER — Other Ambulatory Visit: Payer: Self-pay | Admitting: *Deleted

## 2011-10-08 ENCOUNTER — Telehealth: Payer: Self-pay | Admitting: *Deleted

## 2011-10-08 DIAGNOSIS — C3490 Malignant neoplasm of unspecified part of unspecified bronchus or lung: Secondary | ICD-10-CM

## 2011-10-08 MED ORDER — ERLOTINIB HCL 150 MG PO TABS: 150.0000 mg | ORAL_TABLET | Freq: Every day | ORAL | Status: DC

## 2011-10-08 NOTE — Telephone Encounter (Signed)
patient confirmed over the phone the new date and time 

## 2011-10-24 ENCOUNTER — Telehealth: Payer: Self-pay | Admitting: *Deleted

## 2011-10-24 NOTE — Telephone Encounter (Signed)
PT. HAS HAD BLURRED VISION FOR SEVERAL WEEKS. THE PAST WEEK HER VISION IS "MUCH WORSE". SHE IS UNABLE TO READ SMALL PRINT AND WHEN DRIVING "THINGS LOOK FOGGY". VERBAL ORDER AND READ BACK TO ADRENA JOHNSON,PA- PT. NEEDS TO CALL HER EYE DOCTOR. SHE CAN CALL THIS OFFICE WITH AN UPDATE AFTER SPEAKING TO HER EYE DOCTOR. NOTIFIED PT. SHE VOICES UNDERSTANDING.

## 2011-10-30 ENCOUNTER — Encounter (INDEPENDENT_AMBULATORY_CARE_PROVIDER_SITE_OTHER): Payer: Self-pay | Admitting: Ophthalmology

## 2011-10-30 ENCOUNTER — Encounter (INDEPENDENT_AMBULATORY_CARE_PROVIDER_SITE_OTHER): Payer: Medicare Other | Admitting: Ophthalmology

## 2011-10-30 DIAGNOSIS — H26499 Other secondary cataract, unspecified eye: Secondary | ICD-10-CM

## 2011-10-30 DIAGNOSIS — H35039 Hypertensive retinopathy, unspecified eye: Secondary | ICD-10-CM

## 2011-10-30 DIAGNOSIS — H35379 Puckering of macula, unspecified eye: Secondary | ICD-10-CM

## 2011-10-30 DIAGNOSIS — I10 Essential (primary) hypertension: Secondary | ICD-10-CM

## 2011-10-30 DIAGNOSIS — D3130 Benign neoplasm of unspecified choroid: Secondary | ICD-10-CM

## 2011-10-30 DIAGNOSIS — H43819 Vitreous degeneration, unspecified eye: Secondary | ICD-10-CM

## 2011-11-01 ENCOUNTER — Encounter: Payer: Self-pay | Admitting: *Deleted

## 2011-11-01 NOTE — Progress Notes (Signed)
RECEIVED A FAX FROM [facility] CONCERNING A CONFIRMATION OF PRESCRIPTION SHIPMENT FOR TARCEVA. 

## 2011-11-05 ENCOUNTER — Other Ambulatory Visit: Payer: Medicare Other | Admitting: Lab

## 2011-11-05 ENCOUNTER — Ambulatory Visit: Payer: Medicare Other | Admitting: Physician Assistant

## 2011-11-05 ENCOUNTER — Ambulatory Visit: Payer: Medicare Other | Admitting: Internal Medicine

## 2011-11-06 ENCOUNTER — Ambulatory Visit (INDEPENDENT_AMBULATORY_CARE_PROVIDER_SITE_OTHER): Payer: Medicare Other | Admitting: Ophthalmology

## 2011-11-06 DIAGNOSIS — H35379 Puckering of macula, unspecified eye: Secondary | ICD-10-CM

## 2011-11-06 DIAGNOSIS — I10 Essential (primary) hypertension: Secondary | ICD-10-CM

## 2011-11-06 DIAGNOSIS — H35039 Hypertensive retinopathy, unspecified eye: Secondary | ICD-10-CM

## 2011-11-06 DIAGNOSIS — H2700 Aphakia, unspecified eye: Secondary | ICD-10-CM

## 2011-11-12 ENCOUNTER — Encounter: Payer: Self-pay | Admitting: Physician Assistant

## 2011-11-12 ENCOUNTER — Other Ambulatory Visit: Payer: Medicare Other | Admitting: Lab

## 2011-11-12 ENCOUNTER — Ambulatory Visit (HOSPITAL_BASED_OUTPATIENT_CLINIC_OR_DEPARTMENT_OTHER): Payer: Medicare Other | Admitting: Physician Assistant

## 2011-11-12 VITALS — BP 143/73 | HR 64 | Temp 97.8°F | Ht 67.0 in | Wt 152.1 lb

## 2011-11-12 DIAGNOSIS — C3410 Malignant neoplasm of upper lobe, unspecified bronchus or lung: Secondary | ICD-10-CM

## 2011-11-12 DIAGNOSIS — C7800 Secondary malignant neoplasm of unspecified lung: Secondary | ICD-10-CM

## 2011-11-12 DIAGNOSIS — Z853 Personal history of malignant neoplasm of breast: Secondary | ICD-10-CM

## 2011-11-12 LAB — CBC WITH DIFFERENTIAL/PLATELET
EOS%: 2.9 % (ref 0.0–7.0)
Eosinophils Absolute: 0.2 10*3/uL (ref 0.0–0.5)
HGB: 12.6 g/dL (ref 11.6–15.9)
MCV: 89.2 fL (ref 79.5–101.0)
MONO%: 11.4 % (ref 0.0–14.0)
NEUT#: 3.6 10*3/uL (ref 1.5–6.5)
RBC: 4.22 10*6/uL (ref 3.70–5.45)
RDW: 14.2 % (ref 11.2–14.5)
lymph#: 1.2 10*3/uL (ref 0.9–3.3)

## 2011-11-12 LAB — COMPREHENSIVE METABOLIC PANEL
AST: 25 U/L (ref 0–37)
Albumin: 4.2 g/dL (ref 3.5–5.2)
Alkaline Phosphatase: 97 U/L (ref 39–117)
Calcium: 9.4 mg/dL (ref 8.4–10.5)
Chloride: 106 mEq/L (ref 96–112)
Glucose, Bld: 95 mg/dL (ref 70–99)
Potassium: 4.2 mEq/L (ref 3.5–5.3)
Sodium: 141 mEq/L (ref 135–145)
Total Protein: 6.2 g/dL (ref 6.0–8.3)

## 2011-11-12 NOTE — Progress Notes (Signed)
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  Prior Therapy: None.  Current therapy: Tarceva at 150 mg by mouth daily status post 9 months therapy.   INTERVAL HISTORY: Kristin [name] 71 y.o. female returns to the clinic today for followup visit.  She continues to tolerate the Tarceva therapy relatively well with the exception of some occasional episodes of diarrhea that do not require treatment with Imodium. She is mild skin rash and dry skin related to the Tarceva which respond well to moisturizing skin lotion. She reports a dry cough that tends to occur primarily at night when she is lying down. She also has some intermittent shortness of breath with exertion depending on what activities she is engaged and.   MEDICAL HISTORY: Past Medical History  Diagnosis Date  . Hypertension   . Anemia   . Breast cancer 2002  . Lung cancer dx'd 12/2010    ALLERGIES:  is allergic to sulfa antibiotics.  MEDICATIONS:  Current Outpatient Prescriptions  Medication Sig Dispense Refill  . amLODipine (NORVASC) 5 MG tablet Take 5 mg by mouth daily.        . Calcium Carb-Cholecalciferol (RA CALCIUM 600/VITAMIN D-3) 600-400 MG-UNIT TABS Take 2 tablets by mouth daily.        Marland Kitchen dextromethorphan (DELSYM) 30 MG/5ML liquid Take 60 mg by mouth as needed.        . erlotinib (TARCEVA) 150 MG tablet Take 1 tablet (150 mg total) by mouth daily.  30 tablet  2  . escitalopram (LEXAPRO) 10 MG tablet Take 10 mg by mouth daily.        Marland Kitchen esomeprazole (NEXIUM) 40 MG capsule Take 1 capsule 30 min before breakfast.  30 capsule  11  . famotidine (PEPCID) 40 MG tablet Take 40 mg by mouth daily.        . Fe Fum-FePoly-FA-Vit C-Vit B3 (FOLIVANE-F PO) Take 1 capsule by mouth daily.        Marland Kitchen FeFum-FePoly-FA-B  Cmp-C-Biot (FOLIVANE-PLUS) CAPS TAKE 1 CAPSULE BY MOUTH DAILY  30 capsule  3  . ibandronate (BONIVA) 150 MG tablet Take 150 mg by mouth every 30 (thirty) days. Take in the morning with a full glass of water, on an empty stomach, and do not take anything else by mouth or lie down for the next 30 min.       Marland Kitchen letrozole (FEMARA) 2.5 MG tablet Take 2.5 mg by mouth daily.          SURGICAL HISTORY:  Past Surgical History  Procedure Date  . Appendectomy   . Mastectomy     bilateral  . Lung surgery   . Cataracts     bilateral  . Colonoscopy     REVIEW OF SYSTEMS:  A comprehensive review of systems was negative except for: Respiratory: positive for cough and dyspnea on exertion Gastrointestinal: positive for diarrhea Dry skin and mild skin rash related to the Tarceva therapy   PHYSICAL EXAMINATION: General appearance: alert, cooperative and no distress Head: Normocephalic, without obvious abnormality, atraumatic Neck: no adenopathy Lymph nodes: Cervical, supraclavicular, and axillary nodes normal. Resp: clear to auscultation bilaterally Cardio: regular rate and rhythm, S1, S2 normal, no murmur, click, rub or gallop GI: soft, non-tender; bowel sounds normal; no masses,  no organomegaly Extremities: extremities normal, atraumatic, no cyanosis or edema Neurologic:  Alert and oriented X 3, normal strength and tone. Normal symmetric reflexes. Normal coordination and gait  ECOG PERFORMANCE STATUS: 1 - Symptomatic but completely ambulatory  Blood pressure 143/73, pulse 64, temperature 97.8 F (36.6 C), temperature source Oral, height 5\' 7"  (1.702 m), weight 152 lb 1.6 oz (68.992 kg).  LABORATORY DATA: Lab Results  Component Value Date   WBC 5.7 11/12/2011   HGB 12.6 11/12/2011   HCT 37.6 11/12/2011   MCV 89.2 11/12/2011   PLT 363 11/12/2011      Chemistry      Component Value Date/Time   NA 139 09/24/2011 1155   K 4.1 09/24/2011 1155   CL 104 09/24/2011 1155   CO2 23 09/24/2011 1155   BUN  19 09/24/2011 1155   CREATININE 0.93 09/24/2011 1155      Component Value Date/Time   CALCIUM 9.2 09/24/2011 1155   ALKPHOS 83 09/24/2011 1155   AST 18 09/24/2011 1155   ALT 16 09/24/2011 1155   BILITOT 0.6 09/24/2011 1155       RADIOGRAPHIC STUDIES: Ct Chest W Contrast  09/24/2011  *RADIOLOGY REPORT*  Clinical Data:  Follow-up lung carcinoma.  Previous breast cancer. Currently undergoing chemotherapy.  CT CHEST, ABDOMEN AND PELVIS WITH CONTRAST  Technique:  Multidetector CT imaging of the chest, abdomen and pelvis was performed following the standard protocol during bolus administration of intravenous contrast.  Contrast: OMNIPAQUE IOHEXOL 300 MG/ML IV SOLN  Comparison:  06/28/2011  CT CHEST  Findings:  Postsurgical changes in the left upper lung field remains stable.  Large hiatal hernia is again demonstrated.  No other mediastinal or hilar soft tissue masses are identified.  No evidence of lymphadenopathy elsewhere within the thorax.  Tiny sub- centimeter thyroid nodules remains stable.  No suspicious pulmonary nodules or masses are identified.  There is no evidence of pulmonary infiltrate.  No endobronchial lesion identified.  No evidence of pleural or pericardial effusion.  No suspicious bone lesions are identified.  IMPRESSION:  1.  Stable exam.  No evidence of recurrent carcinoma or metastatic disease within the thorax. 2.  Large hiatal hernia.    CT ABDOMEN AND PELVIS  Findings:  Small right hepatic lobe cyst remains stable.  No liver masses are identified.  Gallbladder is unremarkable.  The spleen, pancreas, adrenal glands, and kidneys are also normal in appearance.  Tiny partially calcified uterine fibroids remains stable.  No other soft tissue masses or lymphadenopathy identified within the abdomen or pelvis.  No evidence of inflammatory process or ascites.  No suspicious bone lesions are identified.  IMPRESSION: Stable exam.  No evidence of metastatic disease or other acute findings  within the abdomen or pelvis.  Original Report Authenticated By: Danae Orleans, M.D.    ASSESSMENT/PLAN: This is a very pleasant 71 years old white female with metastatic non-small cell lung cancer, adenocarcinoma with positive EGFR mutation currently on treatment with Tarceva. She had no evidence for disease progression on her most recent CT scan. Patient was discussed with Dr. Arbutus Ped. She will continue on her Tarceva at 150 mg by mouth daily. She'll return in one month for another symptom management visit with a repeat CBC differential and C. Met.  Conni Slipper, PA-C   All questions were answered. The patient knows to call the clinic with any problems, questions or concerns. We can certainly see the patient much sooner if necessary.

## 2011-12-05 ENCOUNTER — Telehealth: Payer: Self-pay | Admitting: *Deleted

## 2011-12-05 NOTE — Telephone Encounter (Signed)
Received fax communication that tarceva script processed & will be shipped to pt for delivery on 12/03/11.

## 2011-12-10 ENCOUNTER — Encounter: Payer: Self-pay | Admitting: Physician Assistant

## 2011-12-10 ENCOUNTER — Other Ambulatory Visit: Payer: Medicare Other | Admitting: Lab

## 2011-12-10 ENCOUNTER — Ambulatory Visit (HOSPITAL_BASED_OUTPATIENT_CLINIC_OR_DEPARTMENT_OTHER): Payer: Medicare Other | Admitting: Physician Assistant

## 2011-12-10 ENCOUNTER — Telehealth: Payer: Self-pay | Admitting: Internal Medicine

## 2011-12-10 VITALS — BP 152/72 | HR 70 | Temp 96.7°F | Ht 67.0 in | Wt 154.9 lb

## 2011-12-10 DIAGNOSIS — R21 Rash and other nonspecific skin eruption: Secondary | ICD-10-CM

## 2011-12-10 DIAGNOSIS — C3410 Malignant neoplasm of upper lobe, unspecified bronchus or lung: Secondary | ICD-10-CM

## 2011-12-10 LAB — COMPREHENSIVE METABOLIC PANEL
ALT: 14 U/L (ref 0–35)
AST: 18 U/L (ref 0–37)
Albumin: 3.9 g/dL (ref 3.5–5.2)
Alkaline Phosphatase: 91 U/L (ref 39–117)
BUN: 14 mg/dL (ref 6–23)
Potassium: 4.6 mEq/L (ref 3.5–5.3)
Sodium: 142 mEq/L (ref 135–145)
Total Protein: 5.7 g/dL — ABNORMAL LOW (ref 6.0–8.3)

## 2011-12-10 LAB — CBC WITH DIFFERENTIAL/PLATELET
BASO%: 0.6 % (ref 0.0–2.0)
EOS%: 4.6 % (ref 0.0–7.0)
MCH: 29.4 pg (ref 25.1–34.0)
MCV: 88.8 fL (ref 79.5–101.0)
MONO%: 12 % (ref 0.0–14.0)
RBC: 3.96 10*6/uL (ref 3.70–5.45)
RDW: 14.2 % (ref 11.2–14.5)

## 2011-12-10 NOTE — Progress Notes (Signed)
[facility] OFFICE PROGRESS [name], MD, MD [address] 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  Prior Therapy: None.  Current therapy: Tarceva at 150 mg by mouth daily status post 9 months therapy.   INTERVAL HISTORY: Kristin [name] 71 y.o. female returns to the clinic today for followup visit.  She continues to tolerate the Tarceva therapy relatively well with the exception of some occasional episodes of diarrhea that do not require treatment with Imodium. She has mild skin rash and dry skin related to the Tarceva which respond well to moisturizing skin lotion.Lately the rash has affected her legs. She also notes eyelash line irritation affecting the left more so than the right. She finds that warm compresses soothing.  She also has some intermittent shortness of breath with exertion depending on what activities she is engaged and.   MEDICAL HISTORY: Past Medical History  Diagnosis Date  . Hypertension   . Anemia   . Breast cancer 2002  . Lung cancer dx'd 12/2010    ALLERGIES:  is allergic to sulfa antibiotics.  MEDICATIONS:  Current Outpatient Prescriptions  Medication Sig Dispense Refill  . amLODipine (NORVASC) 5 MG tablet Take 5 mg by mouth daily.        . Calcium Carb-Cholecalciferol (RA CALCIUM 600/VITAMIN D-3) 600-400 MG-UNIT TABS Take 2 tablets by mouth daily.        Marland Kitchen dextromethorphan (DELSYM) 30 MG/5ML liquid Take 60 mg by mouth as needed.        . erlotinib (TARCEVA) 150 MG tablet Take 1 tablet (150 mg total) by mouth daily.  30 tablet  2  . escitalopram (LEXAPRO) 10 MG tablet Take 10 mg by mouth daily.        Marland Kitchen esomeprazole (NEXIUM) 40 MG capsule Take 1 capsule 30 min before breakfast.  30 capsule  11  . famotidine (PEPCID) 40 MG tablet Take 40 mg by mouth daily.        . Fe Fum-FePoly-FA-Vit C-Vit B3  (FOLIVANE-F PO) Take 1 capsule by mouth daily.        Marland Kitchen FeFum-FePoly-FA-B Cmp-C-Biot (FOLIVANE-PLUS) CAPS TAKE 1 CAPSULE BY MOUTH DAILY  30 capsule  3  . ibandronate (BONIVA) 150 MG tablet Take 150 mg by mouth every 30 (thirty) days. Take in the morning with a full glass of water, on an empty stomach, and do not take anything else by mouth or lie down for the next 30 min.       Marland Kitchen letrozole (FEMARA) 2.5 MG tablet Take 2.5 mg by mouth daily.          SURGICAL HISTORY:  Past Surgical History  Procedure Date  . Appendectomy   . Mastectomy     bilateral  . Lung surgery   . Cataracts     bilateral  . Colonoscopy     REVIEW OF SYSTEMS:  A comprehensive review of systems was negative except for: Respiratory: positive for cough and dyspnea on exertion Gastrointestinal: positive for diarrhea Dry skin and mild skin rash related to the Tarceva therapy   PHYSICAL EXAMINATION: General appearance: alert, cooperative and no distress Head: Normocephalic, without obvious abnormality, atraumatic. Lower lash line of left eye with mild erythema/irritation, no exudate Neck: no adenopathy Lymph nodes: Cervical, supraclavicular, and axillary nodes normal. Resp: clear to auscultation bilaterally Cardio: regular rate and rhythm, S1, S2 normal, no murmur, click, rub or  gallop GI: soft, non-tender; bowel sounds normal; no masses,  no organomegaly Extremities: extremities normal, atraumatic, no cyanosis or edema Neurologic: Alert and oriented X 3, normal strength and tone. Normal symmetric reflexes. Normal coordination and gait  ECOG PERFORMANCE STATUS: 1 - Symptomatic but completely ambulatory  Blood pressure 152/72, pulse 70, temperature 96.7 F (35.9 C), temperature source Oral, height 5\' 7"  (1.702 m), weight 154 lb 14.4 oz (70.262 kg).  LABORATORY DATA: Lab Results  Component Value Date   WBC 5.2 12/10/2011   HGB 11.7 12/10/2011   HCT 35.2 12/10/2011   MCV 88.8 12/10/2011   PLT 391 12/10/2011       Chemistry      Component Value Date/Time   NA 142 12/10/2011 0913   K 4.6 12/10/2011 0913   CL 107 12/10/2011 0913   CO2 28 12/10/2011 0913   BUN 14 12/10/2011 0913   CREATININE 0.89 12/10/2011 0913      Component Value Date/Time   CALCIUM 9.6 12/10/2011 0913   ALKPHOS 91 12/10/2011 0913   AST 18 12/10/2011 0913   ALT 14 12/10/2011 0913   BILITOT 0.5 12/10/2011 0913       RADIOGRAPHIC STUDIES: Ct Chest W Contrast  09/24/2011  *RADIOLOGY REPORT*  Clinical Data:  Follow-up lung carcinoma.  Previous breast cancer. Currently undergoing chemotherapy.  CT CHEST, ABDOMEN AND PELVIS WITH CONTRAST  Technique:  Multidetector CT imaging of the chest, abdomen and pelvis was performed following the standard protocol during bolus administration of intravenous contrast.  Contrast: OMNIPAQUE IOHEXOL 300 MG/ML IV SOLN  Comparison:  06/28/2011  CT CHEST  Findings:  Postsurgical changes in the left upper lung field remains stable.  Large hiatal hernia is again demonstrated.  No other mediastinal or hilar soft tissue masses are identified.  No evidence of lymphadenopathy elsewhere within the thorax.  Tiny sub- centimeter thyroid nodules remains stable.  No suspicious pulmonary nodules or masses are identified.  There is no evidence of pulmonary infiltrate.  No endobronchial lesion identified.  No evidence of pleural or pericardial effusion.  No suspicious bone lesions are identified.  IMPRESSION:  1.  Stable exam.  No evidence of recurrent carcinoma or metastatic disease within the thorax. 2.  Large hiatal hernia.    CT ABDOMEN AND PELVIS  Findings:  Small right hepatic lobe cyst remains stable.  No liver masses are identified.  Gallbladder is unremarkable.  The spleen, pancreas, adrenal glands, and kidneys are also normal in appearance.  Tiny partially calcified uterine fibroids remains stable.  No other soft tissue masses or lymphadenopathy identified within the abdomen or pelvis.  No evidence of inflammatory  process or ascites.  No suspicious bone lesions are identified.  IMPRESSION: Stable exam.  No evidence of metastatic disease or other acute findings within the abdomen or pelvis.  Original Report Authenticated By: Danae Orleans, M.D.    ASSESSMENT/PLAN: This is a very pleasant 71 years old white female with metastatic non-small cell lung cancer, adenocarcinoma with positive EGFR mutation currently on treatment with Tarceva. She had no evidence for disease progression on her most recent CT scan. Patient was discussed with Dr. Arbutus Ped. She will continue on her Tarceva at 150 mg by mouth daily. She is to continue with warm compresses to her eyes as needed. She will followup with Dr. Gwenyth Bouillon in one month with a repeat CBC differential see med and CT the chest abdomen and pelvis with contrast to reevaluate her disease.   Laural Benes, Damaso Laday E, PA-C  All questions were answered. The patient knows to call the clinic with any problems, questions or concerns. We can certainly see the patient much sooner if necessary.

## 2011-12-10 NOTE — Telephone Encounter (Signed)
appt made and printed for 3/15 and 3/19,contrast given  aom

## 2012-01-01 ENCOUNTER — Other Ambulatory Visit: Payer: Self-pay | Admitting: Medical Oncology

## 2012-01-01 ENCOUNTER — Encounter: Payer: Self-pay | Admitting: *Deleted

## 2012-01-01 DIAGNOSIS — C3490 Malignant neoplasm of unspecified part of unspecified bronchus or lung: Secondary | ICD-10-CM

## 2012-01-01 MED ORDER — ERLOTINIB HCL 150 MG PO TABS: 150.0000 mg | ORAL_TABLET | Freq: Every day | ORAL | Status: DC

## 2012-01-01 NOTE — Progress Notes (Signed)
[facility] faxed refill request for Tarceva.  This request to MD for review.

## 2012-01-01 NOTE — Telephone Encounter (Signed)
Received fax for tarceva refills -sent to Dr Donnald Garre

## 2012-01-02 ENCOUNTER — Other Ambulatory Visit: Payer: Self-pay | Admitting: *Deleted

## 2012-01-02 DIAGNOSIS — C3490 Malignant neoplasm of unspecified part of unspecified bronchus or lung: Secondary | ICD-10-CM

## 2012-01-03 ENCOUNTER — Other Ambulatory Visit (HOSPITAL_BASED_OUTPATIENT_CLINIC_OR_DEPARTMENT_OTHER): Payer: Medicare Other | Admitting: Lab

## 2012-01-03 ENCOUNTER — Ambulatory Visit (HOSPITAL_COMMUNITY)
Admission: RE | Admit: 2012-01-03 | Discharge: 2012-01-03 | Disposition: A | Discharge status: Home / Self Care | Payer: Medicare Other | Source: Ambulatory Visit | Attending: Physician Assistant | Admitting: Physician Assistant

## 2012-01-03 DIAGNOSIS — I517 Cardiomegaly: Secondary | ICD-10-CM | POA: Insufficient documentation

## 2012-01-03 DIAGNOSIS — C3410 Malignant neoplasm of upper lobe, unspecified bronchus or lung: Secondary | ICD-10-CM | POA: Insufficient documentation

## 2012-01-03 DIAGNOSIS — N289 Disorder of kidney and ureter, unspecified: Secondary | ICD-10-CM | POA: Insufficient documentation

## 2012-01-03 DIAGNOSIS — K449 Diaphragmatic hernia without obstruction or gangrene: Secondary | ICD-10-CM | POA: Insufficient documentation

## 2012-01-03 DIAGNOSIS — K7689 Other specified diseases of liver: Secondary | ICD-10-CM | POA: Insufficient documentation

## 2012-01-03 LAB — CBC WITH DIFFERENTIAL/PLATELET
Eosinophils Absolute: 0.2 10*3/uL (ref 0.0–0.5)
HCT: 38.3 % (ref 34.8–46.6)
LYMPH%: 23.9 % (ref 14.0–49.7)
MCHC: 33 g/dL (ref 31.5–36.0)
MONO#: 0.6 10*3/uL (ref 0.1–0.9)
NEUT#: 3.1 10*3/uL (ref 1.5–6.5)
NEUT%: 60.5 % (ref 38.4–76.8)
Platelets: 397 10*3/uL (ref 145–400)
WBC: 5.1 10*3/uL (ref 3.9–10.3)

## 2012-01-03 LAB — CMP (CANCER CENTER ONLY)
ALT(SGPT): 24 U/L (ref 10–47)
Alkaline Phosphatase: 93 U/L — ABNORMAL HIGH (ref 26–84)
CO2: 29 mEq/L (ref 18–33)
Creat: 0.8 mg/dl (ref 0.6–1.2)
Sodium: 143 mEq/L (ref 128–145)
Total Bilirubin: 0.9 mg/dl (ref 0.20–1.60)

## 2012-01-03 MED ORDER — IOHEXOL 300 MG/ML  SOLN
100.0000 mL | Freq: Once | INTRAMUSCULAR | Status: AC | PRN
  Administered 2012-01-03: 100 mL via INTRAVENOUS

## 2012-01-07 ENCOUNTER — Telehealth: Payer: Self-pay | Admitting: Internal Medicine

## 2012-01-07 ENCOUNTER — Ambulatory Visit (HOSPITAL_BASED_OUTPATIENT_CLINIC_OR_DEPARTMENT_OTHER): Payer: Medicare Other | Admitting: Internal Medicine

## 2012-01-07 VITALS — BP 160/70 | HR 64 | Temp 97.2°F | Ht 67.0 in | Wt 151.7 lb

## 2012-01-07 DIAGNOSIS — C3410 Malignant neoplasm of upper lobe, unspecified bronchus or lung: Secondary | ICD-10-CM

## 2012-01-07 DIAGNOSIS — C801 Malignant (primary) neoplasm, unspecified: Secondary | ICD-10-CM

## 2012-01-07 DIAGNOSIS — C7800 Secondary malignant neoplasm of unspecified lung: Secondary | ICD-10-CM

## 2012-01-07 NOTE — Progress Notes (Signed)
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   Prior Therapy: None.   Current therapy: Tarceva at 150 mg by mouth daily status post 10 months therapy.   INTERVAL HISTORY: Kristin [name] 71 y.o. [name] returns to the clinic today for routine followup visit. She was accompanied by her daughter-in-law. The patient denied having any complaints today. She is tolerating her treatment with Tarceva fairly well with no significant skin rash or significant diarrhea. She has no chest pain or shortness of breath. No significant weight loss or night sweats. The patient has a repeat CT scan of the chest, abdomen and pelvis performed recently and she is here today for evaluation and discussion of her scan results.  MEDICAL HISTORY: Past Medical History  Diagnosis Date  . Hypertension   . Anemia   . Breast cancer 2002  . Lung cancer dx'd 12/2010    ALLERGIES:  is allergic to sulfa antibiotics.  MEDICATIONS:  Current Outpatient Prescriptions  Medication Sig Dispense Refill  . amLODipine (NORVASC) 5 MG tablet Take 5 mg by mouth daily.        . Calcium Carb-Cholecalciferol (RA CALCIUM 600/VITAMIN D-3) 600-400 MG-UNIT TABS Take 2 tablets by mouth daily.        Marland Kitchen erlotinib (TARCEVA) 150 MG tablet Take 1 tablet (150 mg total) by mouth daily.  30 tablet  2  . escitalopram (LEXAPRO) 10 MG tablet Take 10 mg by mouth daily.        . famotidine (PEPCID) 40 MG tablet Take 40 mg by mouth daily.        Marland Kitchen FeFum-FePoly-FA-B Cmp-C-Biot (FOLIVANE-PLUS) CAPS TAKE 1 CAPSULE BY MOUTH DAILY  30 capsule  3  . ibandronate (BONIVA) 150 MG tablet Take 150 mg by mouth every 30 (thirty) days. Take in the morning with a full glass of water, on an empty stomach,  and do not take anything else by mouth or lie down for the next 30 min.       Marland Kitchen letrozole (FEMARA) 2.5 MG tablet Take 2.5 mg by mouth daily.        Marland Kitchen dextromethorphan (DELSYM) 30 MG/5ML liquid Take 60 mg by mouth as needed.        Marland Kitchen esomeprazole (NEXIUM) 40 MG capsule Take 1 capsule 30 min before breakfast.  30 capsule  11  . Fe Fum-FePoly-FA-Vit C-Vit B3 (FOLIVANE-F PO) Take 1 capsule by mouth daily.          SURGICAL HISTORY:  Past Surgical History  Procedure Date  . Appendectomy   . Mastectomy     bilateral  . Lung surgery   . Cataracts     bilateral  . Colonoscopy     REVIEW OF SYSTEMS:  A comprehensive review of systems was negative.   PHYSICAL EXAMINATION: General appearance: alert, cooperative and no distress Neck: no adenopathy Lymph nodes: Cervical, supraclavicular, and axillary nodes normal. Resp: clear to auscultation bilaterally Cardio: regular rate and rhythm, S1, S2 normal, no murmur, click, rub or gallop GI: soft, non-tender; bowel sounds normal; no masses,  no organomegaly Extremities: extremities normal, atraumatic, no cyanosis or edema Neurologic: Alert and oriented X 3, normal strength and tone. Normal symmetric reflexes. Normal coordination and gait  ECOG PERFORMANCE STATUS: 0 - Asymptomatic  Blood pressure 160/70, pulse 64, temperature 97.2 F (36.2 C), temperature source Oral, height 5\' 7"  (1.702 m), weight 151 lb 11.2 oz (68.811 kg).  LABORATORY DATA: Lab Results  Component Value Date   WBC 5.1 01/03/2012   HGB 12.6 01/03/2012   HCT 38.3 01/03/2012   MCV 89.5 01/03/2012   PLT 397 01/03/2012      Chemistry      Component Value Date/Time   NA 143 01/03/2012 1210   NA 142 12/10/2011 0913   K 4.2 01/03/2012 1210   K 4.6 12/10/2011 0913   CL 100 01/03/2012 1210   CL 107 12/10/2011 0913   CO2 29 01/03/2012 1210   CO2 28 12/10/2011 0913   BUN 15 01/03/2012 1210   BUN 14 12/10/2011 0913   CREATININE 0.8 01/03/2012 1210   CREATININE 0.89 12/10/2011 0913        Component Value Date/Time   CALCIUM 9.2 01/03/2012 1210   CALCIUM 9.6 12/10/2011 0913   ALKPHOS 93* 01/03/2012 1210   ALKPHOS 91 12/10/2011 0913   AST 26 01/03/2012 1210   AST 18 12/10/2011 0913   ALT 14 12/10/2011 0913   BILITOT 0.90 01/03/2012 1210   BILITOT 0.5 12/10/2011 0913       RADIOGRAPHIC STUDIES: Ct Chest W Contrast  01/03/2012  *RADIOLOGY REPORT*  Clinical Data:  Follow up of lung cancer.  Ongoing chemotherapy. Bilateral breast cancer in 2002 with mastectomy.  CT CHEST, ABDOMEN AND PELVIS WITH CONTRAST  Technique: Contiguous axial images of the chest abdomen and pelvis were obtained after IV contrast administration.  Contrast: 100  ml Omnipaque-300  Comparison: 09/24/2011.   CT CHEST  Findings: Lung windows demonstrate mild volume loss/scarring in the right middle lobe.  Postsurgical changes in the left lung.  Likely left upper lobe wedge resection. No pulmonary nodule or airspace disease identified.  Soft tissue windows demonstrate sub centimeter low density thyroid nodules which are not significantly changed.  Right axillary nodal dissection.  No axillary adenopathy.  Bilateral breast reconstruction.  Tortuous descending thoracic aorta.  Mild cardiomegaly, without pericardial or pleural effusion. No central pulmonary embolism, on this non-dedicated study.  No mediastinal or hilar adenopathy.  A moderate hiatal hernia.  No internal mammary adenopathy.  IMPRESSION: 1.  Surgical changes within the left lung.  No recurrent or metastatic disease. 2.  Moderate hiatal hernia.   CT ABDOMEN AND PELVIS  Findings:  A right liver lobe cyst is unchanged. Focal steatosis adjacent the falciform ligament.  Normal spleen, distal stomach, pancreas, gallbladder, biliary tract, right adrenal adrenal gland. Too small to characterize bilateral renal lesions, likely cysts. Mild left adrenal nodularity is unchanged and was present back to 04/08/2011.  No retroperitoneal or retrocrural adenopathy. The thoracic duct  is mildly prominent, but unchanged on image 57.  Normal colon and terminal ileum.  Probable appendectomy. Normal small bowel without abdominal ascites.    No pelvic adenopathy.  Normal urinary bladder.  Dystrophic calcifications within uterus.  No adnexal mass or significant free pelvic fluid.  Similar bone island within the left ischium.  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  IMPRESSION: No acute process or evidence of metastatic disease in the abdomen or pelvis.  Original Report Authenticated By: Consuello Bossier, M.D.    ASSESSMENT: This is a very pleasant 71 years old white [name] with metastatic non-small cell lung cancer currently on treatment with Tarceva status post 10 months of treatment.  The patient is tolerating her treatment fairly well and she has no evidence for disease progression on his recent scan.  PLAN: I discussed the scan results with the patient and her daughter-in-law. I recommended for her to continue on treatment with Tarceva with the current dose. I would see the patient back for followup visit in one month. She was advised to call me immediately if she has any concerning symptoms in the interval.  All questions were answered. The patient knows to call the clinic with any problems, questions or concerns. We can certainly see the patient much sooner if necessary.

## 2012-01-07 NOTE — Telephone Encounter (Signed)
appts made and printed for pt aom °

## 2012-01-10 ENCOUNTER — Encounter: Payer: Self-pay | Admitting: *Deleted

## 2012-01-10 NOTE — Progress Notes (Signed)
ON 01/08/12 RECEIVED A FAX FROM [facility] CONCERNING A FUNDING COMMUNICATION. PT. HAS BEEN APPROVED FOR COPAY ASSISTANCE. THIS NOTICE WAS GIVEN TO THE FINANCIAL COUNSELOR.

## 2012-01-28 ENCOUNTER — Other Ambulatory Visit: Payer: Self-pay | Admitting: Physician Assistant

## 2012-01-28 DIAGNOSIS — C3410 Malignant neoplasm of upper lobe, unspecified bronchus or lung: Secondary | ICD-10-CM

## 2012-02-07 ENCOUNTER — Telehealth: Payer: Self-pay | Admitting: Internal Medicine

## 2012-02-07 ENCOUNTER — Other Ambulatory Visit (HOSPITAL_BASED_OUTPATIENT_CLINIC_OR_DEPARTMENT_OTHER): Payer: Medicare Other | Admitting: Lab

## 2012-02-07 ENCOUNTER — Ambulatory Visit (HOSPITAL_BASED_OUTPATIENT_CLINIC_OR_DEPARTMENT_OTHER): Payer: Medicare Other | Admitting: Physician Assistant

## 2012-02-07 ENCOUNTER — Encounter: Payer: Self-pay | Admitting: Physician Assistant

## 2012-02-07 VITALS — BP 146/67 | HR 64 | Temp 97.4°F | Ht 67.0 in | Wt 151.2 lb

## 2012-02-07 DIAGNOSIS — Z9010 Acquired absence of unspecified breast and nipple: Secondary | ICD-10-CM

## 2012-02-07 DIAGNOSIS — C3410 Malignant neoplasm of upper lobe, unspecified bronchus or lung: Secondary | ICD-10-CM

## 2012-02-07 LAB — CBC WITH DIFFERENTIAL/PLATELET
Eosinophils Absolute: 0.3 10*3/uL (ref 0.0–0.5)
HCT: 36.5 % (ref 34.8–46.6)
LYMPH%: 22.1 % (ref 14.0–49.7)
MCHC: 33.7 g/dL (ref 31.5–36.0)
MCV: 90.8 fL (ref 79.5–101.0)
MONO#: 0.7 10*3/uL (ref 0.1–0.9)
MONO%: 12.7 % (ref 0.0–14.0)
NEUT#: 3.4 10*3/uL (ref 1.5–6.5)
NEUT%: 59.3 % (ref 38.4–76.8)
Platelets: 377 10*3/uL (ref 145–400)
WBC: 5.7 10*3/uL (ref 3.9–10.3)

## 2012-02-07 LAB — COMPREHENSIVE METABOLIC PANEL
BUN: 13 mg/dL (ref 6–23)
CO2: 25 mEq/L (ref 19–32)
Calcium: 9.5 mg/dL (ref 8.4–10.5)
Chloride: 106 mEq/L (ref 96–112)
Creatinine, Ser: 0.87 mg/dL (ref 0.50–1.10)
Glucose, Bld: 88 mg/dL (ref 70–99)

## 2012-02-07 NOTE — Progress Notes (Signed)
[facility] Telephone:([phone]   Fax:(336) [phone]  OFFICE PROGRESS NOTE  EASON,PAUL, MD, MD [address] [place] [address]  Principle Diagnosis: Metastatic non-small cell lung cancer adenocarcinoma with positive for EGFR mutation at exon 21 diagnosed in March of 2012.   Prior Therapy: None.   Current therapy: Tarceva at 150 mg by mouth daily status post 11 months therapy.   INTERVAL HISTORY: Kristin [name] 71 y.o. female returns to the clinic today for routine followup visit. Overall the patient is tolerating her therapy with Tarceva without difficulty. She's experienced some dry skin and only occasional bouts of diarrhea. The patient denied having any complaints today. She denied chest pain or shortness of breath. No significant weight loss or night sweats. She is able to perform all of her activities of daily living without difficulty.  MEDICAL HISTORY: Past Medical History  Diagnosis Date  . Hypertension   . Anemia   . Breast cancer 2002  . Lung cancer dx'd 12/2010    ALLERGIES:  is allergic to sulfa antibiotics.  MEDICATIONS:  Current Outpatient Prescriptions  Medication Sig Dispense Refill  . amLODipine (NORVASC) 5 MG tablet Take 5 mg by mouth daily.        . Calcium Carb-Cholecalciferol (RA CALCIUM 600/VITAMIN D-3) 600-400 MG-UNIT TABS Take 2 tablets by mouth daily.        Marland Kitchen dextromethorphan (DELSYM) 30 MG/5ML liquid Take 60 mg by mouth as needed.        . erlotinib (TARCEVA) 150 MG tablet Take 1 tablet (150 mg total) by mouth daily.  30 tablet  2  . escitalopram (LEXAPRO) 10 MG tablet Take 10 mg by mouth daily.        Marland Kitchen esomeprazole (NEXIUM) 40 MG capsule Take 1 capsule 30 min before breakfast.  30 capsule  11  . famotidine (PEPCID) 40 MG tablet Take 40 mg by mouth daily.        . Fe Fum-FePoly-FA-Vit C-Vit B3 (FOLIVANE-F PO) Take 1 capsule by mouth daily.        Marland Kitchen FeFum-FePoly-FA-B Cmp-C-Biot (FOLIVANE-PLUS) CAPS TAKE 1  CAPSULE BY MOUTH DAILY  30 capsule  0  . ibandronate (BONIVA) 150 MG tablet Take 150 mg by mouth every 30 (thirty) days. Take in the morning with a full glass of water, on an empty stomach, and do not take anything else by mouth or lie down for the next 30 min.       Marland Kitchen letrozole (FEMARA) 2.5 MG tablet Take 2.5 mg by mouth daily.          SURGICAL HISTORY:  Past Surgical History  Procedure Date  . Appendectomy   . Mastectomy     bilateral  . Lung surgery   . Cataracts     bilateral  . Colonoscopy     REVIEW OF SYSTEMS:  A comprehensive review of systems was negative.   PHYSICAL EXAMINATION: General appearance: alert, cooperative and no distress Neck: no adenopathy Lymph nodes: Cervical, supraclavicular, and axillary nodes normal. Resp: clear to auscultation bilaterally Cardio: regular rate and rhythm, S1, S2 normal, no murmur, click, rub or gallop GI: soft, non-tender; bowel sounds normal; no masses,  no organomegaly Extremities: extremities normal, atraumatic, no cyanosis or edema Neurologic: Alert and oriented X 3, normal strength and tone. Normal symmetric reflexes. Normal coordination and gait  ECOG PERFORMANCE STATUS: 0 - Asymptomatic  Blood pressure 146/67, pulse 64, temperature 97.4 F (36.3 C), temperature source Oral, height 5\' 7"  (  1.702 m), weight 151 lb 3.2 oz (68.584 kg).  LABORATORY DATA: Lab Results  Component Value Date   WBC 5.7 02/07/2012   HGB 12.3 02/07/2012   HCT 36.5 02/07/2012   MCV 90.8 02/07/2012   PLT 377 02/07/2012      Chemistry      Component Value Date/Time   NA 143 01/03/2012 1210   NA 142 12/10/2011 0913   K 4.2 01/03/2012 1210   K 4.6 12/10/2011 0913   CL 100 01/03/2012 1210   CL 107 12/10/2011 0913   CO2 29 01/03/2012 1210   CO2 28 12/10/2011 0913   BUN 15 01/03/2012 1210   BUN 14 12/10/2011 0913   CREATININE 0.8 01/03/2012 1210   CREATININE 0.89 12/10/2011 0913      Component Value Date/Time   CALCIUM 9.2 01/03/2012 1210   CALCIUM 9.6  12/10/2011 0913   ALKPHOS 93* 01/03/2012 1210   ALKPHOS 91 12/10/2011 0913   AST 26 01/03/2012 1210   AST 18 12/10/2011 0913   ALT 14 12/10/2011 0913   BILITOT 0.90 01/03/2012 1210   BILITOT 0.5 12/10/2011 0913       RADIOGRAPHIC STUDIES: Ct Chest W Contrast  01/03/2012  *RADIOLOGY REPORT*  Clinical Data:  Follow up of lung cancer.  Ongoing chemotherapy. Bilateral breast cancer in 2002 with mastectomy.  CT CHEST, ABDOMEN AND PELVIS WITH CONTRAST  Technique: Contiguous axial images of the chest abdomen and pelvis were obtained after IV contrast administration.  Contrast: 100  ml Omnipaque-300  Comparison: 09/24/2011.   CT CHEST  Findings: Lung windows demonstrate mild volume loss/scarring in the right middle lobe.  Postsurgical changes in the left lung.  Likely left upper lobe wedge resection. No pulmonary nodule or airspace disease identified.  Soft tissue windows demonstrate sub centimeter low density thyroid nodules which are not significantly changed.  Right axillary nodal dissection.  No axillary adenopathy.  Bilateral breast reconstruction.  Tortuous descending thoracic aorta.  Mild cardiomegaly, without pericardial or pleural effusion. No central pulmonary embolism, on this non-dedicated study.  No mediastinal or hilar adenopathy.  A moderate hiatal hernia.  No internal mammary adenopathy.  IMPRESSION: 1.  Surgical changes within the left lung.  No recurrent or metastatic disease. 2.  Moderate hiatal hernia.   CT ABDOMEN AND PELVIS  Findings:  A right liver lobe cyst is unchanged. Focal steatosis adjacent the falciform ligament.  Normal spleen, distal stomach, pancreas, gallbladder, biliary tract, right adrenal adrenal gland. Too small to characterize bilateral renal lesions, likely cysts. Mild left adrenal nodularity is unchanged and was present back to 04/08/2011.  No retroperitoneal or retrocrural adenopathy. The thoracic duct is mildly prominent, but unchanged on image 57.  Normal colon and  terminal ileum.  Probable appendectomy. Normal small bowel without abdominal ascites.    No pelvic adenopathy.  Normal urinary bladder.  Dystrophic calcifications within uterus.  No adnexal mass or significant free pelvic fluid.  Similar bone island within the left ischium.  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  IMPRESSION: No acute process or evidence of metastatic disease in the abdomen or pelvis.  Original Report Authenticated By: Consuello Bossier, M.D.    ASSESSMENT/PLAN: This is a very pleasant 71 years old white female with metastatic non-small cell lung cancer currently on treatment with Tarceva status post 10 months of treatment. Overall the patient is tolerating her treatment fairly well and she has no evidence for disease progression  on her recent scan. She will continue on Tarceva at 150 mg by mouth daily. She'll return in one month with a repeat CBC differential and C. met and symptom management visit. She will be due for a restaging CT scan in mid June and we will try to accommodate her vacation schedule when sending out her restaging studies and subsequent followup appointment.  Kristin [name], Kristin West E, PA-C  All questions were answered. The patient knows to call the clinic with any problems, questions or concerns. We can certainly see the patient much sooner if necessary.

## 2012-02-07 NOTE — Telephone Encounter (Signed)
appts made and printed for pt aom °

## 2012-02-27 ENCOUNTER — Telehealth: Payer: Self-pay | Admitting: *Deleted

## 2012-02-27 NOTE — Telephone Encounter (Signed)
[facility] faxed Tarceva refill request.  Request to MD for review.

## 2012-02-28 ENCOUNTER — Telehealth: Payer: Self-pay | Admitting: Medical Oncology

## 2012-02-28 DIAGNOSIS — C3490 Malignant neoplasm of unspecified part of unspecified bronchus or lung: Secondary | ICD-10-CM

## 2012-02-28 MED ORDER — ERLOTINIB HCL 150 MG PO TABS: 150.0000 mg | ORAL_TABLET | Freq: Every day | ORAL | Status: DC

## 2012-02-28 NOTE — Telephone Encounter (Signed)
Faxed refill for tarceva  

## 2012-03-02 ENCOUNTER — Encounter: Payer: Self-pay | Admitting: *Deleted

## 2012-03-02 NOTE — Progress Notes (Signed)
RECEIVED A FAX FROM [facility] CONCERNING A CONFIRMATION OF PRESCRIPTION SHIPMENT FOR TARCEVA. 

## 2012-03-05 ENCOUNTER — Encounter: Payer: Self-pay | Admitting: Physician Assistant

## 2012-03-05 ENCOUNTER — Telehealth: Payer: Self-pay | Admitting: *Deleted

## 2012-03-05 ENCOUNTER — Other Ambulatory Visit (HOSPITAL_BASED_OUTPATIENT_CLINIC_OR_DEPARTMENT_OTHER): Payer: Medicare Other | Admitting: Lab

## 2012-03-05 ENCOUNTER — Ambulatory Visit (HOSPITAL_BASED_OUTPATIENT_CLINIC_OR_DEPARTMENT_OTHER): Payer: Medicare Other | Admitting: Physician Assistant

## 2012-03-05 VITALS — BP 175/64 | HR 82 | Temp 97.8°F | Ht 67.0 in | Wt 151.6 lb

## 2012-03-05 DIAGNOSIS — C3410 Malignant neoplasm of upper lobe, unspecified bronchus or lung: Secondary | ICD-10-CM

## 2012-03-05 LAB — CBC WITH DIFFERENTIAL/PLATELET
BASO%: 1.3 % (ref 0.0–2.0)
Basophils Absolute: 0.1 10*3/uL (ref 0.0–0.1)
Eosinophils Absolute: 0.1 10*3/uL (ref 0.0–0.5)
HCT: 36.7 % (ref 34.8–46.6)
HGB: 12.2 g/dL (ref 11.6–15.9)
MCHC: 33.2 g/dL (ref 31.5–36.0)
MONO#: 0.4 10*3/uL (ref 0.1–0.9)
NEUT#: 3.5 10*3/uL (ref 1.5–6.5)
NEUT%: 66.1 % (ref 38.4–76.8)
WBC: 5.2 10*3/uL (ref 3.9–10.3)
lymph#: 1.2 10*3/uL (ref 0.9–3.3)

## 2012-03-05 LAB — COMPREHENSIVE METABOLIC PANEL
AST: 22 U/L (ref 0–37)
Albumin: 4 g/dL (ref 3.5–5.2)
BUN: 14 mg/dL (ref 6–23)
Calcium: 9.2 mg/dL (ref 8.4–10.5)
Chloride: 103 mEq/L (ref 96–112)
Potassium: 3.8 mEq/L (ref 3.5–5.3)

## 2012-03-05 NOTE — Telephone Encounter (Signed)
gave patient appointment for 03-26-2012 scan arrival time 10:45am gave patient appointment for 03-31-2012 9:15am with the md printed out calendar and gave to the patient

## 2012-03-08 NOTE — Progress Notes (Signed)
[facility] Telephone:(336) [phone]   Fax:([phone]  OFFICE PROGRESS NOTE  EASON,PAUL, MD, MD [address] [place] [address]  Principle Diagnosis: Metastatic non-small cell lung cancer adenocarcinoma with positive for EGFR mutation at exon 21 diagnosed in March of 2012.   Prior Therapy: None.   Current therapy: Tarceva at 150 mg by mouth daily status post 12 months therapy.   INTERVAL HISTORY: Kristin [name] 71 y.o. female returns to the clinic today for routine followup visit accompanied by her sister. Overall the patient is tolerating her therapy with Tarceva without difficulty. She's experienced some dry skin and only occasional bouts of diarrhea. The patient denied having any complaints today. She denied chest pain or shortness of breath. No significant weight loss or night sweats. She is able to perform all of her activities of daily living without difficulty.She and her sister are planning a shopping spree after today's office visit.  MEDICAL HISTORY: Past Medical History  Diagnosis Date  . Hypertension   . Anemia   . Breast cancer 2002  . Lung cancer dx'd 12/2010    ALLERGIES:  is allergic to sulfa antibiotics.  MEDICATIONS:  Current Outpatient Prescriptions  Medication Sig Dispense Refill  . amLODipine (NORVASC) 5 MG tablet Take 5 mg by mouth daily.        . Calcium Carb-Cholecalciferol (RA CALCIUM 600/VITAMIN D-3) 600-400 MG-UNIT TABS Take 2 tablets by mouth daily.        Marland Kitchen dextromethorphan (DELSYM) 30 MG/5ML liquid Take 60 mg by mouth as needed.        . erlotinib (TARCEVA) 150 MG tablet Take 1 tablet (150 mg total) by mouth daily.  30 tablet  1  . escitalopram (LEXAPRO) 10 MG tablet Take 10 mg by mouth daily.        Marland Kitchen esomeprazole (NEXIUM) 40 MG capsule Take 1 capsule 30 min before breakfast.  30 capsule  11  . famotidine (PEPCID) 40 MG tablet Take 40 mg by mouth daily.        . Fe Fum-FePoly-FA-Vit C-Vit B3 (FOLIVANE-F PO)  Take 1 capsule by mouth daily.        Marland Kitchen FeFum-FePoly-FA-B Cmp-C-Biot (FOLIVANE-PLUS) CAPS TAKE 1 CAPSULE BY MOUTH DAILY  30 capsule  0  . ibandronate (BONIVA) 150 MG tablet Take 150 mg by mouth every 30 (thirty) days. Take in the morning with a full glass of water, on an empty stomach, and do not take anything else by mouth or lie down for the next 30 min.       Marland Kitchen letrozole (FEMARA) 2.5 MG tablet Take 2.5 mg by mouth daily.          SURGICAL HISTORY:  Past Surgical History  Procedure Date  . Appendectomy   . Mastectomy     bilateral  . Lung surgery   . Cataracts     bilateral  . Colonoscopy     REVIEW OF SYSTEMS:  A comprehensive review of systems was negative.   PHYSICAL EXAMINATION: General appearance: alert, cooperative and no distress Neck: no adenopathy Lymph nodes: Cervical, supraclavicular, and axillary nodes normal. Resp: clear to auscultation bilaterally Cardio: regular rate and rhythm, S1, S2 normal, no murmur, click, rub or gallop GI: soft, non-tender; bowel sounds normal; no masses,  no organomegaly Extremities: extremities normal, atraumatic, no cyanosis or edema Neurologic: Alert and oriented X 3, normal strength and tone. Normal symmetric reflexes. Normal coordination and gait  ECOG PERFORMANCE STATUS: 0 - Asymptomatic  Blood pressure 175/64, pulse 82, temperature 97.8 F (36.6 C), temperature source Oral, height 5\' 7"  (1.702 m), weight 151 lb 9.6 oz (68.765 kg).  LABORATORY DATA: Lab Results  Component Value Date   WBC 5.2 03/05/2012   HGB 12.2 03/05/2012   HCT 36.7 03/05/2012   MCV 87.8 03/05/2012   PLT 393 03/05/2012      Chemistry      Component Value Date/Time   NA 136 03/05/2012 1313   NA 143 01/03/2012 1210   K 3.8 03/05/2012 1313   K 4.2 01/03/2012 1210   CL 103 03/05/2012 1313   CL 100 01/03/2012 1210   CO2 28 03/05/2012 1313   CO2 29 01/03/2012 1210   BUN 14 03/05/2012 1313   BUN 15 01/03/2012 1210   CREATININE 1.04 03/05/2012 1313   CREATININE 0.8  01/03/2012 1210      Component Value Date/Time   CALCIUM 9.2 03/05/2012 1313   CALCIUM 9.2 01/03/2012 1210   ALKPHOS 75 03/05/2012 1313   ALKPHOS 93* 01/03/2012 1210   AST 22 03/05/2012 1313   AST 26 01/03/2012 1210   ALT 16 03/05/2012 1313   BILITOT 0.7 03/05/2012 1313   BILITOT 0.90 01/03/2012 1210       RADIOGRAPHIC STUDIES: Ct Chest W Contrast  01/03/2012  *RADIOLOGY REPORT*  Clinical Data:  Follow up of lung cancer.  Ongoing chemotherapy. Bilateral breast cancer in 2002 with mastectomy.  CT CHEST, ABDOMEN AND PELVIS WITH CONTRAST  Technique: Contiguous axial images of the chest abdomen and pelvis were obtained after IV contrast administration.  Contrast: 100  ml Omnipaque-300  Comparison: 09/24/2011.   CT CHEST  Findings: Lung windows demonstrate mild volume loss/scarring in the right middle lobe.  Postsurgical changes in the left lung.  Likely left upper lobe wedge resection. No pulmonary nodule or airspace disease identified.  Soft tissue windows demonstrate sub centimeter low density thyroid nodules which are not significantly changed.  Right axillary nodal dissection.  No axillary adenopathy.  Bilateral breast reconstruction.  Tortuous descending thoracic aorta.  Mild cardiomegaly, without pericardial or pleural effusion. No central pulmonary embolism, on this non-dedicated study.  No mediastinal or hilar adenopathy.  A moderate hiatal hernia.  No internal mammary adenopathy.  IMPRESSION: 1.  Surgical changes within the left lung.  No recurrent or metastatic disease. 2.  Moderate hiatal hernia.   CT ABDOMEN AND PELVIS  Findings:  A right liver lobe cyst is unchanged. Focal steatosis adjacent the falciform ligament.  Normal spleen, distal stomach, pancreas, gallbladder, biliary tract, right adrenal adrenal gland. Too small to characterize bilateral renal lesions, likely cysts. Mild left adrenal nodularity is unchanged and was present back to 04/08/2011.  No retroperitoneal or retrocrural  adenopathy. The thoracic duct is mildly prominent, but unchanged on image 57.  Normal colon and terminal ileum.  Probable appendectomy. Normal small bowel without abdominal ascites.    No pelvic adenopathy.  Normal urinary bladder.  Dystrophic calcifications within uterus.  No adnexal mass or significant free pelvic fluid.  Similar bone island within the left ischium.  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  IMPRESSION: No acute process or evidence of metastatic disease in the abdomen or pelvis.  Original Report Authenticated By: Consuello Bossier, M.D.    ASSESSMENT/PLAN: This is a very pleasant 71 years old white female with metastatic non-small cell lung cancer currently on treatment with Tarceva status post 12 months of treatment. Overall  the patient is tolerating her treatment fairly well and she has no evidence for disease progression on her last scan. The patient was discussed with Dr. Truett Perna in Dr. Asa Lente absence. She will continue on Tarceva at 150 mg by mouth daily. She'll return in one month to see Dr. Arbutus Ped with a repeat CBC differential and C. met as well as a CT of the chest, abdomen and pelvis with contrast to reevaluate her disease.  Laural Benes, Manson Luckadoo E, PA-C  All questions were answered. The patient knows to call the clinic with any problems, questions or concerns. We can certainly see the patient much sooner if necessary.

## 2012-03-10 ENCOUNTER — Other Ambulatory Visit: Payer: Self-pay | Admitting: Physician Assistant

## 2012-03-10 DIAGNOSIS — C3490 Malignant neoplasm of unspecified part of unspecified bronchus or lung: Secondary | ICD-10-CM

## 2012-03-12 ENCOUNTER — Telehealth: Payer: Self-pay | Admitting: Internal Medicine

## 2012-03-12 NOTE — Telephone Encounter (Signed)
pt called as she had not been set up for her labs prior to  and was not given contrast,pt to p/u today   aom

## 2012-03-26 ENCOUNTER — Other Ambulatory Visit (HOSPITAL_BASED_OUTPATIENT_CLINIC_OR_DEPARTMENT_OTHER): Payer: Medicare Other | Admitting: Lab

## 2012-03-26 ENCOUNTER — Ambulatory Visit (HOSPITAL_COMMUNITY)
Admission: RE | Admit: 2012-03-26 | Discharge: 2012-03-26 | Disposition: A | Discharge status: Home / Self Care | Payer: Medicare Other | Source: Ambulatory Visit | Attending: Physician Assistant | Admitting: Physician Assistant

## 2012-03-26 DIAGNOSIS — C3410 Malignant neoplasm of upper lobe, unspecified bronchus or lung: Secondary | ICD-10-CM

## 2012-03-26 DIAGNOSIS — J984 Other disorders of lung: Secondary | ICD-10-CM | POA: Insufficient documentation

## 2012-03-26 DIAGNOSIS — C3490 Malignant neoplasm of unspecified part of unspecified bronchus or lung: Secondary | ICD-10-CM | POA: Insufficient documentation

## 2012-03-26 DIAGNOSIS — K7689 Other specified diseases of liver: Secondary | ICD-10-CM | POA: Insufficient documentation

## 2012-03-26 DIAGNOSIS — K449 Diaphragmatic hernia without obstruction or gangrene: Secondary | ICD-10-CM | POA: Insufficient documentation

## 2012-03-26 DIAGNOSIS — E278 Other specified disorders of adrenal gland: Secondary | ICD-10-CM | POA: Insufficient documentation

## 2012-03-26 LAB — CMP (CANCER CENTER ONLY)
ALT(SGPT): 20 U/L (ref 10–47)
Alkaline Phosphatase: 84 U/L (ref 26–84)
Sodium: 147 mEq/L — ABNORMAL HIGH (ref 128–145)
Total Bilirubin: 0.7 mg/dl (ref 0.20–1.60)
Total Protein: 6.9 g/dL (ref 6.4–8.1)

## 2012-03-26 LAB — CBC WITH DIFFERENTIAL/PLATELET
BASO%: 1.3 % (ref 0.0–2.0)
EOS%: 4.5 % (ref 0.0–7.0)
MCH: 29.9 pg (ref 25.1–34.0)
MCHC: 33.5 g/dL (ref 31.5–36.0)
MCV: 89.3 fL (ref 79.5–101.0)
MONO%: 11.8 % (ref 0.0–14.0)
RBC: 4.24 10*6/uL (ref 3.70–5.45)
RDW: 13.8 % (ref 11.2–14.5)
lymph#: 1.1 10*3/uL (ref 0.9–3.3)
nRBC: 0 % (ref 0–0)

## 2012-03-26 MED ORDER — IOHEXOL 300 MG/ML  SOLN
100.0000 mL | Freq: Once | INTRAMUSCULAR | Status: AC | PRN
  Administered 2012-03-26: 100 mL via INTRAVENOUS

## 2012-03-31 ENCOUNTER — Encounter: Payer: Self-pay | Admitting: *Deleted

## 2012-03-31 ENCOUNTER — Ambulatory Visit (HOSPITAL_BASED_OUTPATIENT_CLINIC_OR_DEPARTMENT_OTHER): Payer: Medicare Other | Admitting: Internal Medicine

## 2012-03-31 ENCOUNTER — Telehealth: Payer: Self-pay | Admitting: Internal Medicine

## 2012-03-31 VITALS — BP 136/58 | HR 66 | Temp 97.2°F | Ht 67.0 in | Wt 150.7 lb

## 2012-03-31 DIAGNOSIS — C3410 Malignant neoplasm of upper lobe, unspecified bronchus or lung: Secondary | ICD-10-CM

## 2012-03-31 NOTE — Telephone Encounter (Signed)
appts made and printed for pt aom °

## 2012-03-31 NOTE — Progress Notes (Signed)
[facility] Telephone:(336) [phone]   Fax:(336) [phone]  OFFICE PROGRESS NOTE  EASON,PAUL, MD, MD [address] [place] [address]  Principle Diagnosis: Metastatic non-small cell lung cancer adenocarcinoma with positive for EGFR mutation at exon 21 diagnosed in March of 2012.   Prior Therapy: None.   Current therapy: Tarceva at 150 mg by mouth daily status post 13 months therapy.   INTERVAL HISTORY: Kristin [name] 71 y.o. female returns to the clinic today for followup visit accompanied by her daughter-in-law. The patient is feeling fine today with no specific complaints. She has no significant skin rash and no diarrhea. She denied having any significant chest pain or shortness of breath, no cough or hemoptysis. No significant weight loss or night sweats. The patient has repeat CT scan of the chest, abdomen and pelvis performed recently and she is here today for evaluation and discussion of her scan results.  MEDICAL HISTORY: Past Medical History  Diagnosis Date  . Hypertension   . Anemia   . Breast cancer 2002  . Lung cancer dx'd 12/2010    ALLERGIES:  is allergic to sulfa antibiotics.  MEDICATIONS:  Current Outpatient Prescriptions  Medication Sig Dispense Refill  . amLODipine (NORVASC) 5 MG tablet Take 5 mg by mouth daily.        . Calcium Carb-Cholecalciferol (RA CALCIUM 600/VITAMIN D-3) 600-400 MG-UNIT TABS Take 2 tablets by mouth daily.        Marland Kitchen erlotinib (TARCEVA) 150 MG tablet Take 1 tablet (150 mg total) by mouth daily.  30 tablet  1  . escitalopram (LEXAPRO) 10 MG tablet Take 10 mg by mouth daily.        . Fe Fum-FePoly-FA-Vit C-Vit B3 (FOLIVANE-F PO) Take 1 capsule by mouth daily.        Marland Kitchen ibandronate (BONIVA) 150 MG tablet Take 150 mg by mouth every 30 (thirty) days. Take in the morning with a full glass of water, on an empty stomach, and do not take anything else by mouth or lie down for the next 30 min.       Marland Kitchen letrozole (FEMARA)  2.5 MG tablet Take 2.5 mg by mouth daily.        . famotidine (PEPCID) 40 MG tablet Take 40 mg by mouth daily.          SURGICAL HISTORY:  Past Surgical History  Procedure Date  . Appendectomy   . Mastectomy     bilateral  . Lung surgery   . Cataracts     bilateral  . Colonoscopy     REVIEW OF SYSTEMS:  A comprehensive review of systems was negative.   PHYSICAL EXAMINATION: General appearance: alert, cooperative and no distress Neck: no adenopathy Lymph nodes: Cervical, supraclavicular, and axillary nodes normal. Resp: clear to auscultation bilaterally Cardio: regular rate and rhythm, S1, S2 normal, no murmur, click, rub or gallop GI: soft, non-tender; bowel sounds normal; no masses,  no organomegaly Extremities: extremities normal, atraumatic, no cyanosis or edema Neurologic: Alert and oriented X 3, normal strength and tone. Normal symmetric reflexes. Normal coordination and gait  ECOG PERFORMANCE STATUS: 0 - Asymptomatic  Blood pressure 136/58, pulse 66, temperature 97.2 F (36.2 C), temperature source Oral, height 5\' 7"  (1.702 m), weight 150 lb 11.2 oz (68.357 kg).  LABORATORY DATA: Lab Results  Component Value Date   WBC 4.8 03/26/2012   HGB 12.7 03/26/2012   HCT 37.8 03/26/2012   MCV 89.3 03/26/2012   PLT 372 03/26/2012  Chemistry      Component Value Date/Time   NA 147* 03/26/2012 1022   NA 136 03/05/2012 1313   K 4.8* 03/26/2012 1022   K 3.8 03/05/2012 1313   CL 102 03/26/2012 1022   CL 103 03/05/2012 1313   CO2 31 03/26/2012 1022   CO2 28 03/05/2012 1313   BUN 14 03/26/2012 1022   BUN 14 03/05/2012 1313   CREATININE 0.7 03/26/2012 1022   CREATININE 1.04 03/05/2012 1313      Component Value Date/Time   CALCIUM 9.1 03/26/2012 1022   CALCIUM 9.2 03/05/2012 1313   ALKPHOS 84 03/26/2012 1022   ALKPHOS 75 03/05/2012 1313   AST 26 03/26/2012 1022   AST 22 03/05/2012 1313   ALT 16 03/05/2012 1313   BILITOT 0.70 03/26/2012 1022   BILITOT 0.7 03/05/2012 1313       RADIOGRAPHIC  STUDIES: Ct Chest W Contrast  03/26/2012  *RADIOLOGY REPORT*  Clinical Data:  Metastatic non-small cell lung cancer adenocarcinoma the diagnosed in March 2012.  CT CHEST, ABDOMEN AND PELVIS WITH CONTRAST  Technique:  Multidetector CT imaging of the chest, abdomen and pelvis was performed following the standard protocol during bolus administration of intravenous contrast.  Contrast: OMNIPAQUE IOHEXOL 300 MG/ML  SOLN  Comparison:  CT 01/03/2012 the   CT CHEST  Findings:  No axillary or supraclavicular lymphadenopathy.  No mediastinal or hilar lymphadenopathy.  No pericardial fluid.  Large hiatal hernia is present.  Review of the lung parenchyma demonstrates no  suspicious pulmonary nodules.  Linear scarring in the left upper lobe is stable.  IMPRESSION:  1.  No evidence of lung cancer recurrence or metastasis within the thorax. 2.  Stable postoperative changes in the left upper lobe.  3.  Large hiatal hernia.   CT ABDOMEN AND PELVIS  Findings:  Low density lesion in the right hepatic lobe is unchanged consistent with a simple cyst.  The gallbladder, pancreas, spleen, right adrenal gland are normal.  There is mild nodular thickening of the left adrenal gland to 8 mm which is unchanged from 8 mm on prior.  Kidneys are normal.  The stomach, small bowel and, and colon are normal.  Abdominal aorta is normal caliber.  No retroperitoneal periportal lymphadenopathy.  No peritoneal disease.  The bladder uterus are normal.  Adnexa are normal.  No pelvic lymphadenopathy. Review of bone windows demonstrates no aggressive osseous lesions.  IMPRESSION:  1.  No evidence metastasis in the abdomen pelvis.  2.  Stable nodular enlargement of the left adrenal gland.  Original Report Authenticated By: Genevive Bi, M.D.    ASSESSMENT: This is a very pleasant 71 years old white female with metastatic non-small cell lung cancer, adenocarcinoma with positive EGFR mutation. She is currently on Tarceva and tolerating it fairly  well. The patient has no evidence for disease progression.  PLAN: I discussed the scan results with the patient and her daughter-in-law. I recommended for her to continue on treatment with Tarceva with the current dose.  She would come back for followup visit in one month with repeat CBC and comprehensive metabolic panel. She was advised to call me immediately if she has any concerning symptoms.  All questions were answered. The patient knows to call the clinic with any problems, questions or concerns. We can certainly see the patient much sooner if necessary.  I spent 15 minutes counseling the patient face to face. The total time spent in the appointment was 25 minutes.

## 2012-03-31 NOTE — Progress Notes (Signed)
Spoke with pt and daughter in law at [facility] today.  Questions and concerns addressed

## 2012-04-01 ENCOUNTER — Telehealth: Payer: Self-pay | Admitting: Medical Oncology

## 2012-04-01 DIAGNOSIS — C3490 Malignant neoplasm of unspecified part of unspecified bronchus or lung: Secondary | ICD-10-CM

## 2012-04-01 MED ORDER — ERLOTINIB HCL 150 MG PO TABS: 150.0000 mg | ORAL_TABLET | Freq: Every day | ORAL | Status: DC

## 2012-04-01 NOTE — Telephone Encounter (Signed)
tarceva to be delivered 04/02/12

## 2012-04-27 ENCOUNTER — Other Ambulatory Visit: Payer: Self-pay | Admitting: *Deleted

## 2012-04-27 DIAGNOSIS — C3490 Malignant neoplasm of unspecified part of unspecified bronchus or lung: Secondary | ICD-10-CM

## 2012-04-27 MED ORDER — ERLOTINIB HCL 150 MG PO TABS: 150.0000 mg | ORAL_TABLET | Freq: Every day | ORAL | Status: DC

## 2012-04-27 NOTE — Telephone Encounter (Signed)
Rx faxed to pharmacy  

## 2012-04-28 NOTE — Telephone Encounter (Signed)
RECEIVED A FAX FROM [facility] CONCERNING A CONFIRMATION OF PRESCRIPTION SHIPMENT FOR TARCEVA.

## 2012-04-30 ENCOUNTER — Other Ambulatory Visit (HOSPITAL_BASED_OUTPATIENT_CLINIC_OR_DEPARTMENT_OTHER): Payer: Medicare Other | Admitting: Lab

## 2012-04-30 ENCOUNTER — Ambulatory Visit (HOSPITAL_BASED_OUTPATIENT_CLINIC_OR_DEPARTMENT_OTHER): Payer: Medicare Other | Admitting: Physician Assistant

## 2012-04-30 ENCOUNTER — Encounter: Payer: Self-pay | Admitting: Physician Assistant

## 2012-04-30 ENCOUNTER — Telehealth: Payer: Self-pay | Admitting: Internal Medicine

## 2012-04-30 VITALS — BP 155/71 | HR 64 | Temp 97.8°F | Ht 67.0 in | Wt 149.5 lb

## 2012-04-30 DIAGNOSIS — C3410 Malignant neoplasm of upper lobe, unspecified bronchus or lung: Secondary | ICD-10-CM

## 2012-04-30 LAB — COMPREHENSIVE METABOLIC PANEL
Albumin: 3.9 g/dL (ref 3.5–5.2)
Alkaline Phosphatase: 82 U/L (ref 39–117)
BUN: 11 mg/dL (ref 6–23)
Glucose, Bld: 91 mg/dL (ref 70–99)
Potassium: 4.4 mEq/L (ref 3.5–5.3)
Total Bilirubin: 0.8 mg/dL (ref 0.3–1.2)

## 2012-04-30 LAB — CBC WITH DIFFERENTIAL/PLATELET
Basophils Absolute: 0.1 10*3/uL (ref 0.0–0.1)
Eosinophils Absolute: 0.2 10*3/uL (ref 0.0–0.5)
HGB: 12.3 g/dL (ref 11.6–15.9)
MCV: 91 fL (ref 79.5–101.0)
MONO#: 0.7 10*3/uL (ref 0.1–0.9)
MONO%: 15.3 % — ABNORMAL HIGH (ref 0.0–14.0)
NEUT#: 2.7 10*3/uL (ref 1.5–6.5)
RDW: 14.4 % (ref 11.2–14.5)

## 2012-04-30 NOTE — Telephone Encounter (Signed)
gve the pt her aug 2013 appt calendar 

## 2012-05-04 NOTE — Progress Notes (Signed)
[facility] [phone]   Fax:([phone]) [phone]  OFFICE PROGRESS NOTE  [name], MD [address] 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   Prior Therapy: None.   Current therapy: Tarceva at 150 mg by mouth daily status post 14 months therapy.   INTERVAL HISTORY: Kristin [name] 71 y.o. female returns to the clinic today for followup visit accompanied by her husband. The patient is feeling fine today with no specific complaints. She has no significant skin rash and no diarrhea. She denied having any significant chest pain or shortness of breath, no cough or hemoptysis. No significant weight loss or night sweats.   MEDICAL HISTORY: Past Medical History  Diagnosis Date  . Hypertension   . Anemia   . Breast cancer 2002  . Lung cancer dx'd 12/2010    ALLERGIES:  is allergic to sulfa antibiotics.  MEDICATIONS:  Current Outpatient Prescriptions  Medication Sig Dispense Refill  . amLODipine (NORVASC) 5 MG tablet Take 5 mg by mouth daily.        . Calcium Carb-Cholecalciferol (RA CALCIUM 600/VITAMIN D-3) 600-400 MG-UNIT TABS Take 2 tablets by mouth daily.        Marland Kitchen erlotinib (TARCEVA) 150 MG tablet Take 1 tablet (150 mg total) by mouth daily.  30 tablet  0  . escitalopram (LEXAPRO) 10 MG tablet Take 10 mg by mouth daily.        . famotidine (PEPCID) 40 MG tablet Take 40 mg by mouth daily.        . Fe Fum-FePoly-FA-Vit C-Vit B3 (FOLIVANE-F PO) Take 1 capsule by mouth daily.        Marland Kitchen ibandronate (BONIVA) 150 MG tablet Take 150 mg by mouth every 30 (thirty) days. Take in the morning with a full glass of water, on an empty stomach, and do not take anything else by mouth or lie down for the next 30 min.       Marland Kitchen letrozole (FEMARA) 2.5 MG tablet Take 2.5 mg by mouth daily.        Marland Kitchen zolpidem (AMBIEN) 5 MG tablet          SURGICAL HISTORY:  Past Surgical History  Procedure Date  . Appendectomy   . Mastectomy     bilateral  . Lung surgery   . Cataracts     bilateral  . Colonoscopy     REVIEW OF SYSTEMS:  A comprehensive review of systems was negative.   PHYSICAL EXAMINATION: General appearance: alert, cooperative and no distress Neck: no adenopathy Lymph nodes: Cervical, supraclavicular, and axillary nodes normal. Resp: clear to auscultation bilaterally Cardio: regular rate and rhythm, S1, S2 normal, no murmur, click, rub or gallop GI: soft, non-tender; bowel sounds normal; no masses,  no organomegaly Extremities: extremities normal, atraumatic, no cyanosis or edema Neurologic: Alert and oriented X 3, normal strength and tone. Normal symmetric reflexes. Normal coordination and gait  ECOG PERFORMANCE STATUS: 0 - Asymptomatic  Blood pressure 155/71, pulse 64, temperature 97.8 F (36.6 C), temperature source Oral, height 5\' 7"  (1.702 m), weight 149 lb 8 oz (67.813 kg).  LABORATORY DATA: Lab Results  Component Value Date   WBC 4.6 04/30/2012   HGB 12.3 04/30/2012   HCT 36.6 04/30/2012   MCV 91.0 04/30/2012   PLT 328 04/30/2012      Chemistry      Component Value Date/Time  NA 140 04/30/2012 1024   NA 147* 03/26/2012 1022   K 4.4 04/30/2012 1024   K 4.8* 03/26/2012 1022   CL 105 04/30/2012 1024   CL 102 03/26/2012 1022   CO2 28 04/30/2012 1024   CO2 31 03/26/2012 1022   BUN 11 04/30/2012 1024   BUN 14 03/26/2012 1022   CREATININE 0.92 04/30/2012 1024   CREATININE 0.7 03/26/2012 1022      Component Value Date/Time   CALCIUM 9.5 04/30/2012 1024   CALCIUM 9.1 03/26/2012 1022   ALKPHOS 82 04/30/2012 1024   ALKPHOS 84 03/26/2012 1022   AST 20 04/30/2012 1024   AST 26 03/26/2012 1022   ALT 17 04/30/2012 1024   BILITOT 0.8 04/30/2012 1024   BILITOT 0.70 03/26/2012 1022       RADIOGRAPHIC STUDIES: Ct Chest W Contrast  03/26/2012  *RADIOLOGY REPORT*  Clinical Data:  Metastatic non-small cell lung cancer  adenocarcinoma the diagnosed in March 2012.  CT CHEST, ABDOMEN AND PELVIS WITH CONTRAST  Technique:  Multidetector CT imaging of the chest, abdomen and pelvis was performed following the standard protocol during bolus administration of intravenous contrast.  Contrast: OMNIPAQUE IOHEXOL 300 MG/ML  SOLN  Comparison:  CT 01/03/2012 the   CT CHEST  Findings:  No axillary or supraclavicular lymphadenopathy.  No mediastinal or hilar lymphadenopathy.  No pericardial fluid.  Large hiatal hernia is present.  Review of the lung parenchyma demonstrates no  suspicious pulmonary nodules.  Linear scarring in the left upper lobe is stable.  IMPRESSION:  1.  No evidence of lung cancer recurrence or metastasis within the thorax. 2.  Stable postoperative changes in the left upper lobe.  3.  Large hiatal hernia.   CT ABDOMEN AND PELVIS  Findings:  Low density lesion in the right hepatic lobe is unchanged consistent with a simple cyst.  The gallbladder, pancreas, spleen, right adrenal gland are normal.  There is mild nodular thickening of the left adrenal gland to 8 mm which is unchanged from 8 mm on prior.  Kidneys are normal.  The stomach, small bowel and, and colon are normal.  Abdominal aorta is normal caliber.  No retroperitoneal periportal lymphadenopathy.  No peritoneal disease.  The bladder uterus are normal.  Adnexa are normal.  No pelvic lymphadenopathy. Review of bone windows demonstrates no aggressive osseous lesions.  IMPRESSION:  1.  No evidence metastasis in the abdomen pelvis.  2.  Stable nodular enlargement of the left adrenal gland.  Original Report Authenticated By: Genevive Bi, M.D.    ASSESSMENT/PLAN: This is a very pleasant 71 years old white female with metastatic non-small cell lung cancer, adenocarcinoma with positive EGFR mutation. She is currently on Tarceva and tolerating it fairly well. The patient had no evidence for disease progression on her most recent restaging CT scan. The patient was  discussed with Dr. Arbutus Ped. She will continue on Tarceva at 150 mg by mouth daily. She'll return in one month for another symptom management visit with a repeat CBC differential and C. met.Marland Kitchen  Laural Benes, Iden Stripling E, PA-C   All questions were answered. The patient knows to call the clinic with any problems, questions or concerns. We can certainly see the patient much sooner if necessary.  I spent 15 minutes counseling the patient face to face. The total time spent in the appointment was 25 minutes.

## 2012-05-26 ENCOUNTER — Other Ambulatory Visit: Payer: Self-pay | Admitting: Medical Oncology

## 2012-05-26 ENCOUNTER — Telehealth: Payer: Self-pay | Admitting: Medical Oncology

## 2012-05-26 DIAGNOSIS — C3490 Malignant neoplasm of unspecified part of unspecified bronchus or lung: Secondary | ICD-10-CM

## 2012-05-26 MED ORDER — ERLOTINIB HCL 150 MG PO TABS: 150.0000 mg | ORAL_TABLET | Freq: Every day | ORAL | Status: DC

## 2012-05-26 NOTE — Telephone Encounter (Signed)
tarceva refill faxed to [facility]

## 2012-05-26 NOTE — Telephone Encounter (Signed)
erroneous

## 2012-05-28 ENCOUNTER — Telehealth: Payer: Self-pay | Admitting: *Deleted

## 2012-05-28 ENCOUNTER — Ambulatory Visit (HOSPITAL_BASED_OUTPATIENT_CLINIC_OR_DEPARTMENT_OTHER): Payer: Medicare Other | Admitting: Physician Assistant

## 2012-05-28 ENCOUNTER — Other Ambulatory Visit (HOSPITAL_BASED_OUTPATIENT_CLINIC_OR_DEPARTMENT_OTHER): Payer: Medicare Other | Admitting: Lab

## 2012-05-28 VITALS — BP 169/65 | HR 64 | Temp 97.1°F | Resp 20 | Ht 67.0 in | Wt 150.7 lb

## 2012-05-28 DIAGNOSIS — C3410 Malignant neoplasm of upper lobe, unspecified bronchus or lung: Secondary | ICD-10-CM

## 2012-05-28 LAB — COMPREHENSIVE METABOLIC PANEL
Alkaline Phosphatase: 77 U/L (ref 39–117)
BUN: 12 mg/dL (ref 6–23)
CO2: 26 mEq/L (ref 19–32)
Glucose, Bld: 92 mg/dL (ref 70–99)
Total Bilirubin: 0.8 mg/dL (ref 0.3–1.2)

## 2012-05-28 LAB — CBC WITH DIFFERENTIAL/PLATELET
Basophils Absolute: 0.1 10*3/uL (ref 0.0–0.1)
Eosinophils Absolute: 0.2 10*3/uL (ref 0.0–0.5)
HCT: 36.9 % (ref 34.8–46.6)
HGB: 12.3 g/dL (ref 11.6–15.9)
LYMPH%: 23.5 % (ref 14.0–49.7)
MCV: 90.8 fL (ref 79.5–101.0)
MONO%: 15.3 % — ABNORMAL HIGH (ref 0.0–14.0)
NEUT#: 2.5 10*3/uL (ref 1.5–6.5)
NEUT%: 54.2 % (ref 38.4–76.8)
Platelets: 360 10*3/uL (ref 145–400)
RBC: 4.07 10*6/uL (ref 3.70–5.45)

## 2012-05-28 NOTE — Telephone Encounter (Signed)
Gave patient appointment for 06-23-2012 arrival time 12:15pm gave patient contrast with her on 05-28-2012 gave patient appointment for 06-25-2012 with md

## 2012-05-31 NOTE — Progress Notes (Signed)
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   Prior Therapy: None.   Current therapy: Tarceva at 150 mg by mouth daily status post 15 months therapy.   INTERVAL HISTORY: Kristin [name] 71 y.o. female returns to the clinic today for followup visit.  The patient is feeling fine today with no specific complaints. She has no significant skin rash and no diarrhea. She denied having any significant chest pain or shortness of breath, no cough or hemoptysis. No significant weight loss or night sweats.   MEDICAL HISTORY: Past Medical History  Diagnosis Date  . Hypertension   . Anemia   . Breast cancer 2002  . Lung cancer dx'd 12/2010    ALLERGIES:  is allergic to sulfa antibiotics.  MEDICATIONS:  Current Outpatient Prescriptions  Medication Sig Dispense Refill  . amLODipine (NORVASC) 5 MG tablet Take 5 mg by mouth daily.        . Calcium Carb-Cholecalciferol (RA CALCIUM 600/VITAMIN D-3) 600-400 MG-UNIT TABS Take 2 tablets by mouth daily.        Marland Kitchen erlotinib (TARCEVA) 150 MG tablet Take 1 tablet (150 mg total) by mouth daily.  30 tablet  1  . escitalopram (LEXAPRO) 10 MG tablet Take 10 mg by mouth daily.        . famotidine (PEPCID) 40 MG tablet Take 40 mg by mouth daily.        . Fe Fum-FePoly-FA-Vit C-Vit B3 (FOLIVANE-F PO) Take 1 capsule by mouth daily.        Marland Kitchen ibandronate (BONIVA) 150 MG tablet Take 150 mg by mouth every 30 (thirty) days. Take in the morning with a full glass of water, on an empty stomach, and do not take anything else by mouth or lie down for the next 30 min.       Marland Kitchen letrozole (FEMARA) 2.5 MG tablet Take 2.5 mg by mouth daily.        Marland Kitchen ofloxacin (OCUFLOX) 0.3 % ophthalmic solution       . zolpidem  (AMBIEN) 5 MG tablet         SURGICAL HISTORY:  Past Surgical History  Procedure Date  . Appendectomy   . Mastectomy     bilateral  . Lung surgery   . Cataracts     bilateral  . Colonoscopy     REVIEW OF SYSTEMS:  A comprehensive review of systems was negative.   PHYSICAL EXAMINATION: General appearance: alert, cooperative and no distress Neck: no adenopathy Lymph nodes: Cervical, supraclavicular, and axillary nodes normal. Resp: clear to auscultation bilaterally Cardio: regular rate and rhythm, S1, S2 normal, no murmur, click, rub or gallop GI: soft, non-tender; bowel sounds normal; no masses,  no organomegaly Extremities: extremities normal, atraumatic, no cyanosis or edema Neurologic: Alert and oriented X 3, normal strength and tone. Normal symmetric reflexes. Normal coordination and gait  ECOG PERFORMANCE STATUS: 0 - Asymptomatic  Blood pressure 169/65, pulse 64, temperature 97.1 F (36.2 C), temperature source Oral, resp. rate 20, height 5\' 7"  (1.702 m), weight 150 lb 11.2 oz (68.357 kg).  LABORATORY DATA: Lab Results  Component Value Date   WBC 4.5 05/28/2012   HGB 12.3 05/28/2012   HCT 36.9 05/28/2012   MCV 90.8 05/28/2012   PLT 360 05/28/2012  Chemistry      Component Value Date/Time   NA 141 05/28/2012 1006   NA 147* 03/26/2012 1022   K 4.3 05/28/2012 1006   K 4.8* 03/26/2012 1022   CL 108 05/28/2012 1006   CL 102 03/26/2012 1022   CO2 26 05/28/2012 1006   CO2 31 03/26/2012 1022   BUN 12 05/28/2012 1006   BUN 14 03/26/2012 1022   CREATININE 0.90 05/28/2012 1006   CREATININE 0.7 03/26/2012 1022      Component Value Date/Time   CALCIUM 9.1 05/28/2012 1006   CALCIUM 9.1 03/26/2012 1022   ALKPHOS 77 05/28/2012 1006   ALKPHOS 84 03/26/2012 1022   AST 21 05/28/2012 1006   AST 26 03/26/2012 1022   ALT 17 05/28/2012 1006   BILITOT 0.8 05/28/2012 1006   BILITOT 0.70 03/26/2012 1022       RADIOGRAPHIC STUDIES: Ct Chest W Contrast  03/26/2012  *RADIOLOGY REPORT*  Clinical Data:  Metastatic  non-small cell lung cancer adenocarcinoma the diagnosed in March 2012.  CT CHEST, ABDOMEN AND PELVIS WITH CONTRAST  Technique:  Multidetector CT imaging of the chest, abdomen and pelvis was performed following the standard protocol during bolus administration of intravenous contrast.  Contrast: OMNIPAQUE IOHEXOL 300 MG/ML  SOLN  Comparison:  CT 01/03/2012 the   CT CHEST  Findings:  No axillary or supraclavicular lymphadenopathy.  No mediastinal or hilar lymphadenopathy.  No pericardial fluid.  Large hiatal hernia is present.  Review of the lung parenchyma demonstrates no  suspicious pulmonary nodules.  Linear scarring in the left upper lobe is stable.  IMPRESSION:  1.  No evidence of lung cancer recurrence or metastasis within the thorax. 2.  Stable postoperative changes in the left upper lobe.  3.  Large hiatal hernia.   CT ABDOMEN AND PELVIS  Findings:  Low density lesion in the right hepatic lobe is unchanged consistent with a simple cyst.  The gallbladder, pancreas, spleen, right adrenal gland are normal.  There is mild nodular thickening of the left adrenal gland to 8 mm which is unchanged from 8 mm on prior.  Kidneys are normal.  The stomach, small bowel and, and colon are normal.  Abdominal aorta is normal caliber.  No retroperitoneal periportal lymphadenopathy.  No peritoneal disease.  The bladder uterus are normal.  Adnexa are normal.  No pelvic lymphadenopathy. Review of bone windows demonstrates no aggressive osseous lesions.  IMPRESSION:  1.  No evidence metastasis in the abdomen pelvis.  2.  Stable nodular enlargement of the left adrenal gland.  Original Report Authenticated By: Genevive Bi, M.D.    ASSESSMENT/PLAN: This is a very pleasant 71 years old white female with metastatic non-small cell lung cancer, adenocarcinoma with positive EGFR mutation. She is currently on Tarceva and tolerating it fairly well. The patient had no evidence for disease progression on her most recent restaging  CT scan. The patient was discussed with Dr. Arline Asp in Dr. Asa Lente absence. She will continue on Tarceva at 150 mg by mouth daily. She'll follow up with Dr. Arbutus Ped in one month for another symptom management visit with a repeat CBC differential and C. Met, as well as a CT of the chest, abdomen and pelvis with contrast to re-evaluate her disease.  Laural Benes, Mitcheal Sweetin E, PA-C   All questions were answered. The patient knows to call the clinic with any problems, questions or concerns. We can certainly see the patient much sooner if necessary.  I spent 20 minutes counseling the patient face to  face. The total time spent in the appointment was 30 minutes.

## 2012-06-23 ENCOUNTER — Ambulatory Visit (HOSPITAL_COMMUNITY)
Admission: RE | Admit: 2012-06-23 | Discharge: 2012-06-23 | Disposition: A | Discharge status: Home / Self Care | Payer: Medicare Other | Source: Ambulatory Visit | Attending: Physician Assistant | Admitting: Physician Assistant

## 2012-06-23 DIAGNOSIS — E042 Nontoxic multinodular goiter: Secondary | ICD-10-CM | POA: Insufficient documentation

## 2012-06-23 DIAGNOSIS — K449 Diaphragmatic hernia without obstruction or gangrene: Secondary | ICD-10-CM | POA: Insufficient documentation

## 2012-06-23 DIAGNOSIS — C3410 Malignant neoplasm of upper lobe, unspecified bronchus or lung: Secondary | ICD-10-CM | POA: Insufficient documentation

## 2012-06-23 DIAGNOSIS — K7689 Other specified diseases of liver: Secondary | ICD-10-CM | POA: Insufficient documentation

## 2012-06-23 MED ORDER — IOHEXOL 300 MG/ML  SOLN
100.0000 mL | Freq: Once | INTRAMUSCULAR | Status: AC | PRN
  Administered 2012-06-23: 100 mL via INTRAVENOUS

## 2012-06-25 ENCOUNTER — Telehealth: Payer: Self-pay | Admitting: Internal Medicine

## 2012-06-25 ENCOUNTER — Other Ambulatory Visit (HOSPITAL_BASED_OUTPATIENT_CLINIC_OR_DEPARTMENT_OTHER): Payer: Medicare Other | Admitting: Lab

## 2012-06-25 ENCOUNTER — Ambulatory Visit (HOSPITAL_BASED_OUTPATIENT_CLINIC_OR_DEPARTMENT_OTHER): Payer: Medicare Other | Admitting: Internal Medicine

## 2012-06-25 VITALS — BP 146/72 | HR 60 | Temp 96.7°F | Resp 18 | Ht 67.0 in | Wt 152.7 lb

## 2012-06-25 DIAGNOSIS — C3410 Malignant neoplasm of upper lobe, unspecified bronchus or lung: Secondary | ICD-10-CM

## 2012-06-25 LAB — CBC WITH DIFFERENTIAL/PLATELET
Basophils Absolute: 0.1 10*3/uL (ref 0.0–0.1)
Eosinophils Absolute: 0.2 10*3/uL (ref 0.0–0.5)
HGB: 12.4 g/dL (ref 11.6–15.9)
LYMPH%: 18.7 % (ref 14.0–49.7)
MCV: 90 fL (ref 79.5–101.0)
MONO#: 0.7 10*3/uL (ref 0.1–0.9)
MONO%: 12.6 % (ref 0.0–14.0)
NEUT#: 3.7 10*3/uL (ref 1.5–6.5)
Platelets: 353 10*3/uL (ref 145–400)
RDW: 13.9 % (ref 11.2–14.5)
WBC: 5.8 10*3/uL (ref 3.9–10.3)

## 2012-06-25 LAB — COMPREHENSIVE METABOLIC PANEL (CC13)
Albumin: 3.5 g/dL (ref 3.5–5.0)
Alkaline Phosphatase: 89 U/L (ref 40–150)
BUN: 13 mg/dL (ref 7.0–26.0)
CO2: 26 mEq/L (ref 22–29)
Glucose: 95 mg/dl (ref 70–99)
Potassium: 4.1 mEq/L (ref 3.5–5.1)
Total Protein: 6 g/dL — ABNORMAL LOW (ref 6.4–8.3)

## 2012-06-25 NOTE — Patient Instructions (Signed)
Your scan showed no evidence for disease progression. Continue treatment was Tarceva at the current dose Followup in one month.

## 2012-06-25 NOTE — Progress Notes (Signed)
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   Prior Therapy: None.   Current therapy: Tarceva at 150 mg by mouth daily status post 18 months therapy.  INTERVAL HISTORY: Kristin [name] 71 y.o. female returns to the clinic today for routine followup visit accompanied by her daughter-in-law. The patient is feeling fine today with no specific complaints. She still have very low-grade skin rash. She denied having any significant diarrhea. The patient continues to have some dry skin. She has no significant weight loss or night sweats. She has no chest pain, shortness breath, cough or hemoptysis. The patient is tolerating her treatment was Tarceva fairly well with no significant complaints. She had repeat CT scan of the chest, abdomen and pelvis performed recently and she is here for evaluation and discussion of her scan results.  MEDICAL HISTORY: Past Medical History  Diagnosis Date  . Hypertension   . Anemia   . Breast cancer 2002  . Lung cancer dx'd 12/2010    ALLERGIES:  is allergic to sulfa antibiotics.  MEDICATIONS:  Current Outpatient Prescriptions  Medication Sig Dispense Refill  . amLODipine (NORVASC) 5 MG tablet Take 5 mg by mouth daily.        . Calcium Carb-Cholecalciferol (RA CALCIUM 600/VITAMIN D-3) 600-400 MG-UNIT TABS Take 2 tablets by mouth daily.        Marland Kitchen erlotinib (TARCEVA) 150 MG tablet Take 1 tablet (150 mg total) by mouth daily.  30 tablet  1  . escitalopram (LEXAPRO) 10 MG tablet Take 10 mg by mouth daily.        . famotidine (PEPCID) 40 MG tablet Take 40 mg by mouth daily.        . Fe Fum-FePoly-FA-Vit C-Vit B3 (FOLIVANE-F PO) Take 1 capsule by mouth daily.        Marland Kitchen ibandronate (BONIVA) 150 MG tablet  Take 150 mg by mouth every 30 (thirty) days. Take in the morning with a full glass of water, on an empty stomach, and do not take anything else by mouth or lie down for the next 30 min.       Marland Kitchen letrozole (FEMARA) 2.5 MG tablet Take 2.5 mg by mouth daily.        Marland Kitchen zolpidem (AMBIEN) 5 MG tablet at bedtime as needed.       Marland Kitchen ofloxacin (OCUFLOX) 0.3 % ophthalmic solution         SURGICAL HISTORY:  Past Surgical History  Procedure Date  . Appendectomy   . Mastectomy     bilateral  . Lung surgery   . Cataracts     bilateral  . Colonoscopy     REVIEW OF SYSTEMS:  A comprehensive review of systems was negative.   PHYSICAL EXAMINATION: General appearance: alert, cooperative and no distress Head: Normocephalic, without obvious abnormality, atraumatic Neck: no adenopathy Lymph nodes: Cervical, supraclavicular, and axillary nodes normal. Resp: clear to auscultation bilaterally Cardio: regular rate and rhythm, S1, S2 normal, no murmur, click, rub or gallop GI: soft, non-tender; bowel sounds normal; no masses,  no organomegaly Extremities: extremities normal, atraumatic, no cyanosis or edema Neurologic: Alert and oriented X 3, normal strength and tone. Normal symmetric reflexes. Normal coordination and gait  ECOG PERFORMANCE STATUS: 0 - Asymptomatic  Blood pressure 146/72, pulse  60, temperature 96.7 F (35.9 C), temperature source Oral, resp. rate 18, height 5\' 7"  (1.702 m), weight 152 lb 11.2 oz (69.264 kg).  LABORATORY DATA: Lab Results  Component Value Date   WBC 5.8 06/25/2012   HGB 12.4 06/25/2012   HCT 36.8 06/25/2012   MCV 90.0 06/25/2012   PLT 353 06/25/2012      Chemistry      Component Value Date/Time   NA 142 06/25/2012 0911   NA 141 05/28/2012 1006   NA 147* 03/26/2012 1022   K 4.1 06/25/2012 0911   K 4.3 05/28/2012 1006   K 4.8* 03/26/2012 1022   CL 107 06/25/2012 0911   CL 108 05/28/2012 1006   CL 102 03/26/2012 1022   CO2 26 06/25/2012 0911   CO2 26 05/28/2012 1006   CO2 31 03/26/2012 1022     BUN 13.0 06/25/2012 0911   BUN 12 05/28/2012 1006   BUN 14 03/26/2012 1022   CREATININE 0.9 06/25/2012 0911   CREATININE 0.90 05/28/2012 1006   CREATININE 0.7 03/26/2012 1022      Component Value Date/Time   CALCIUM 9.7 06/25/2012 0911   CALCIUM 9.1 05/28/2012 1006   CALCIUM 9.1 03/26/2012 1022   ALKPHOS 89 06/25/2012 0911   ALKPHOS 77 05/28/2012 1006   ALKPHOS 84 03/26/2012 1022   AST 23 06/25/2012 0911   AST 21 05/28/2012 1006   AST 26 03/26/2012 1022   ALT 18 06/25/2012 0911   ALT 17 05/28/2012 1006   BILITOT 0.80 06/25/2012 0911   BILITOT 0.8 05/28/2012 1006   BILITOT 0.70 03/26/2012 1022       RADIOGRAPHIC STUDIES: Ct Chest W Contrast  06/23/2012  *RADIOLOGY REPORT*  Clinical Data:  Restaging lung carcinoma.  CT CHEST, ABDOMEN AND PELVIS WITH CONTRAST  Technique:  Multidetector CT imaging of the chest, abdomen and pelvis was performed following the standard protocol during bolus administration of intravenous contrast.  Contrast: OMNIPAQUE IOHEXOL 300 MG/ML  SOLN  Comparison:  CT 03/26/2012   CT CHEST  Findings:  No axillary or supraclavicular lymphadenopathy.  Small thyroid nodules are unchanged.  No mediastinal lymphadenopathy. Esophagus is normal.  Large hiatal hernia (sliding type) demonstrated.  Review of the lung parenchyma demonstrates mild linear thickening at the surgical site in the left upper lobe.  No new nodularity or suspicious lesions.  Central airways are normal.  IMPRESSION:  1.  Stable postsurgical change in the left upper lobe.  2.  No evidence of recurrence or progression in the thorax.   CT ABDOMEN AND PELVIS  Findings:  Simple cyst in the right hepatic lobe is again demonstrated.  No new hepatic lesions present.  The gallbladder, pancreas, spleen, adrenal glands, and kidneys are normal.  The small bowel and colon are normal.  Abdominal aorta is normal caliber.  No retroperitoneal or periportal lymphadenopathy.  No free fluid the pelvis.  The bladder is normal.  The uterus and ovaries are  normal.  No cough and bone  IMPRESSION:  1.  No evidence of lung cancer metastasis in the abdomen pelvis.  2.  Stable hepatic cyst.  3.  Hiatal hurnia.   Original Report Authenticated By: Genevive Bi, M.D.     ASSESSMENT: This is a very pleasant 71 years old white female with metastatic non-small cell lung cancer, adenocarcinoma with positive EGFR mutation. The patient is currently on Tarceva for the last 18 months with no significant evidence for disease progression.  PLAN: I discussed the scan results with the  patient. I recommended for her to continue on Tarceva 150 mg by mouth daily. She would come back for followup visit in one month for evaluation and management any adverse effect of her treatment. The patient was advised to call me immediately if she has any concerning symptoms in the interval.  All questions were answered. The patient knows to call the clinic with any problems, questions or concerns. We can certainly see the patient much sooner if necessary.  I spent 15 minutes counseling the patient face to face. The total time spent in the appointment was 25 minutes.

## 2012-06-25 NOTE — Telephone Encounter (Signed)
appts made and printed for pt aom °

## 2012-07-16 ENCOUNTER — Telehealth: Payer: Self-pay | Admitting: Internal Medicine

## 2012-07-16 NOTE — Telephone Encounter (Signed)
s.w. pt and advised of appt change from 10.7.13 to 10.8.13

## 2012-07-27 ENCOUNTER — Other Ambulatory Visit: Payer: Medicare Other | Admitting: Lab

## 2012-07-27 ENCOUNTER — Ambulatory Visit: Payer: Medicare Other | Admitting: Physician Assistant

## 2012-07-27 ENCOUNTER — Other Ambulatory Visit: Payer: Self-pay | Admitting: *Deleted

## 2012-07-27 NOTE — Telephone Encounter (Signed)
THIS REFILL REQUEST FOR TARCEVA WAS GIVEN TO DR.MOHAMED'S NURSE, STEPHANIE JOHNSON,RN. 

## 2012-07-28 ENCOUNTER — Ambulatory Visit (HOSPITAL_BASED_OUTPATIENT_CLINIC_OR_DEPARTMENT_OTHER): Payer: Medicare Other | Admitting: Physician Assistant

## 2012-07-28 ENCOUNTER — Telehealth: Payer: Self-pay | Admitting: Internal Medicine

## 2012-07-28 ENCOUNTER — Other Ambulatory Visit: Payer: Medicare Other | Admitting: Lab

## 2012-07-28 ENCOUNTER — Ambulatory Visit (HOSPITAL_BASED_OUTPATIENT_CLINIC_OR_DEPARTMENT_OTHER): Payer: Medicare Other | Admitting: Lab

## 2012-07-28 ENCOUNTER — Other Ambulatory Visit: Payer: Self-pay | Admitting: *Deleted

## 2012-07-28 ENCOUNTER — Encounter: Payer: Self-pay | Admitting: Physician Assistant

## 2012-07-28 ENCOUNTER — Ambulatory Visit (HOSPITAL_BASED_OUTPATIENT_CLINIC_OR_DEPARTMENT_OTHER): Payer: Medicare Other

## 2012-07-28 ENCOUNTER — Other Ambulatory Visit: Payer: Self-pay | Admitting: Medical Oncology

## 2012-07-28 VITALS — BP 159/68 | HR 65 | Temp 97.2°F | Resp 20 | Ht 67.0 in | Wt 149.8 lb

## 2012-07-28 DIAGNOSIS — D126 Benign neoplasm of colon, unspecified: Secondary | ICD-10-CM

## 2012-07-28 DIAGNOSIS — Z853 Personal history of malignant neoplasm of breast: Secondary | ICD-10-CM

## 2012-07-28 DIAGNOSIS — C3410 Malignant neoplasm of upper lobe, unspecified bronchus or lung: Secondary | ICD-10-CM

## 2012-07-28 DIAGNOSIS — Z23 Encounter for immunization: Secondary | ICD-10-CM

## 2012-07-28 DIAGNOSIS — C3490 Malignant neoplasm of unspecified part of unspecified bronchus or lung: Secondary | ICD-10-CM

## 2012-07-28 LAB — CBC WITH DIFFERENTIAL/PLATELET
Basophils Absolute: 0 10*3/uL (ref 0.0–0.1)
Eosinophils Absolute: 0.1 10*3/uL (ref 0.0–0.5)
HGB: 12.3 g/dL (ref 11.6–15.9)
MCV: 89.8 fL (ref 79.5–101.0)
MONO#: 0.6 10*3/uL (ref 0.1–0.9)
MONO%: 8.5 % (ref 0.0–14.0)
NEUT#: 5 10*3/uL (ref 1.5–6.5)
RBC: 4.22 10*6/uL (ref 3.70–5.45)
RDW: 13.6 % (ref 11.2–14.5)
WBC: 6.9 10*3/uL (ref 3.9–10.3)
lymph#: 1.2 10*3/uL (ref 0.9–3.3)

## 2012-07-28 LAB — COMPREHENSIVE METABOLIC PANEL (CC13)
Albumin: 3.4 g/dL — ABNORMAL LOW (ref 3.5–5.0)
Alkaline Phosphatase: 91 U/L (ref 40–150)
BUN: 18 mg/dL (ref 7.0–26.0)
CO2: 24 mEq/L (ref 22–29)
Calcium: 9.5 mg/dL (ref 8.4–10.4)
Chloride: 107 mEq/L (ref 98–107)
Glucose: 107 mg/dl — ABNORMAL HIGH (ref 70–99)
Potassium: 4.1 mEq/L (ref 3.5–5.1)
Sodium: 141 mEq/L (ref 136–145)
Total Protein: 6.1 g/dL — ABNORMAL LOW (ref 6.4–8.3)

## 2012-07-28 MED ORDER — ERLOTINIB HCL 150 MG PO TABS: 150.0000 mg | ORAL_TABLET | Freq: Every day | ORAL | Status: DC

## 2012-07-28 MED ORDER — INFLUENZA VIRUS VACC SPLIT PF IM SUSP
0.5000 mL | Freq: Once | INTRAMUSCULAR | Status: AC
  Administered 2012-07-28: 0.5 mL via INTRAMUSCULAR
  Filled 2012-07-28: qty 0.5

## 2012-07-28 NOTE — Telephone Encounter (Signed)
Gave pt appt for November 2013 lab and ML

## 2012-07-29 NOTE — Telephone Encounter (Signed)
[facility] faxed Rx communication Report for tarceva.  Prescription has been processed and will be shipped to patient for delivery on 07-30-2012.

## 2012-08-01 NOTE — Progress Notes (Signed)
[facility] [phone]   Fax:(336) [phone]  OFFICE PROGRESS NOTE  [name], MD [address] [place] [address]  Principle Diagnosis: Metastatic non-small cell lung cancer adenocarcinoma with positive for EGFR mutation at exon 21 diagnosed in March of 2012.   Prior Therapy: None.   Current therapy: Tarceva at 150 mg by mouth daily status post 19 months therapy.  INTERVAL HISTORY: Kristin [name] 71 y.o. female returns to the clinic today for routine followup visit.  The patient is feeling fine today with no specific complaints. She still have very low-grade skin rash. She denied having any significant diarrhea. The patient continues to have some dry skin especially on her legs. She has no significant weight loss or night sweats. She has no chest pain, shortness breath, cough or hemoptysis. The patient is tolerating her treatment with Tarceva fairly well with no significant complaints. She reports that she is been walking 2 miles on a daily basis. She requests a flu vaccine today.  MEDICAL HISTORY: Past Medical History  Diagnosis Date  . Hypertension   . Anemia   . Breast cancer 2002  . Lung cancer dx'd 12/2010    ALLERGIES:  is allergic to sulfa antibiotics.  MEDICATIONS:  Current Outpatient Prescriptions  Medication Sig Dispense Refill  . cycloSPORINE (RESTASIS) 0.05 % ophthalmic emulsion Place 1 drop into both eyes 2 (two) times daily.      Bertram Gala Glycol-Propyl Glycol (SYSTANE) 0.4-0.3 % SOLN Apply 1 drop to eye 2 (two) times daily.      Marland Kitchen amLODipine (NORVASC) 5 MG tablet Take 5 mg by mouth daily.        . Calcium Carb-Cholecalciferol (RA CALCIUM 600/VITAMIN D-3) 600-400 MG-UNIT TABS Take 2 tablets by mouth daily.        Marland Kitchen erlotinib (TARCEVA) 150 MG tablet Take 1 tablet (150 mg total) by mouth daily.  30 tablet  2  . escitalopram (LEXAPRO) 10 MG tablet Take 10 mg by mouth daily.        . famotidine (PEPCID) 40 MG tablet Take  40 mg by mouth daily.        . Fe Fum-FePoly-FA-Vit C-Vit B3 (FOLIVANE-F PO) Take 1 capsule by mouth daily.        Marland Kitchen ibandronate (BONIVA) 150 MG tablet Take 150 mg by mouth every 30 (thirty) days. Take in the morning with a full glass of water, on an empty stomach, and do not take anything else by mouth or lie down for the next 30 min.       Marland Kitchen letrozole (FEMARA) 2.5 MG tablet Take 2.5 mg by mouth daily.        Marland Kitchen ofloxacin (OCUFLOX) 0.3 % ophthalmic solution       . zolpidem (AMBIEN) 5 MG tablet at bedtime as needed.         SURGICAL HISTORY:  Past Surgical History  Procedure Date  . Appendectomy   . Mastectomy     bilateral  . Lung surgery   . Cataracts     bilateral  . Colonoscopy     REVIEW OF SYSTEMS:  A comprehensive review of systems was negative.   PHYSICAL EXAMINATION: General appearance: alert, cooperative and no distress Head: Normocephalic, without obvious abnormality, atraumatic Neck: no adenopathy Lymph nodes: Cervical, supraclavicular, and axillary nodes normal. Resp: clear to auscultation bilaterally Cardio: regular rate and rhythm, S1, S2 normal, no murmur, click, rub or gallop GI: soft, non-tender; bowel sounds normal; no masses,  no organomegaly Extremities: extremities normal, atraumatic, no cyanosis or edema Neurologic: Alert and oriented X 3, normal strength and tone. Normal symmetric reflexes. Normal coordination and gait  ECOG PERFORMANCE STATUS: 0 - Asymptomatic  Blood pressure 159/68, pulse 65, temperature 97.2 F (36.2 C), temperature source Oral, resp. rate 20, height 5\' 7"  (1.702 m), weight 149 lb 12.8 oz (67.949 kg).  LABORATORY DATA: Lab Results  Component Value Date   WBC 6.9 07/28/2012   HGB 12.3 07/28/2012   HCT 37.9 07/28/2012   MCV 89.8 07/28/2012   PLT 423* 07/28/2012      Chemistry      Component Value Date/Time   NA 141 07/28/2012 1441   NA 141 05/28/2012 1006   NA 147* 03/26/2012 1022   K 4.1 07/28/2012 1441   K 4.3 05/28/2012 1006   K  4.8* 03/26/2012 1022   CL 107 07/28/2012 1441   CL 108 05/28/2012 1006   CL 102 03/26/2012 1022   CO2 24 07/28/2012 1441   CO2 26 05/28/2012 1006   CO2 31 03/26/2012 1022   BUN 18.0 07/28/2012 1441   BUN 12 05/28/2012 1006   BUN 14 03/26/2012 1022   CREATININE 0.9 07/28/2012 1441   CREATININE 0.90 05/28/2012 1006   CREATININE 0.7 03/26/2012 1022      Component Value Date/Time   CALCIUM 9.5 07/28/2012 1441   CALCIUM 9.1 05/28/2012 1006   CALCIUM 9.1 03/26/2012 1022   ALKPHOS 91 07/28/2012 1441   ALKPHOS 77 05/28/2012 1006   ALKPHOS 84 03/26/2012 1022   AST 18 07/28/2012 1441   AST 21 05/28/2012 1006   AST 26 03/26/2012 1022   ALT 16 07/28/2012 1441   ALT 17 05/28/2012 1006   BILITOT 0.70 07/28/2012 1441   BILITOT 0.8 05/28/2012 1006   BILITOT 0.70 03/26/2012 1022       RADIOGRAPHIC STUDIES: Ct Chest W Contrast  06/23/2012  *RADIOLOGY REPORT*  Clinical Data:  Restaging lung carcinoma.  CT CHEST, ABDOMEN AND PELVIS WITH CONTRAST  Technique:  Multidetector CT imaging of the chest, abdomen and pelvis was performed following the standard protocol during bolus administration of intravenous contrast.  Contrast: OMNIPAQUE IOHEXOL 300 MG/ML  SOLN  Comparison:  CT 03/26/2012   CT CHEST  Findings:  No axillary or supraclavicular lymphadenopathy.  Small thyroid nodules are unchanged.  No mediastinal lymphadenopathy. Esophagus is normal.  Large hiatal hernia (sliding type) demonstrated.  Review of the lung parenchyma demonstrates mild linear thickening at the surgical site in the left upper lobe.  No new nodularity or suspicious lesions.  Central airways are normal.  IMPRESSION:  1.  Stable postsurgical change in the left upper lobe.  2.  No evidence of recurrence or progression in the thorax.   CT ABDOMEN AND PELVIS  Findings:  Simple cyst in the right hepatic lobe is again demonstrated.  No new hepatic lesions present.  The gallbladder, pancreas, spleen, adrenal glands, and kidneys are normal.  The small bowel and colon are  normal.  Abdominal aorta is normal caliber.  No retroperitoneal or periportal lymphadenopathy.  No free fluid the pelvis.  The bladder is normal.  The uterus and ovaries are normal.  No cough and bone  IMPRESSION:  1.  No evidence of lung cancer metastasis in the abdomen pelvis.  2.  Stable hepatic cyst.  3.  Hiatal hurnia.   Original Report Authenticated By: Genevive Bi, M.D.     ASSESSMENT/PLAN: This is a very pleasant 71 years old white female  with metastatic non-small cell lung cancer, adenocarcinoma with positive EGFR mutation. The patient is currently on Tarceva for the last 19 months with no significant evidence for disease progression. The patient was discussed with Dr. Arbutus Ped. She will continue on Tarceva at 150 mg by mouth daily. She'll return in one month for another symptom management visit with a repeat CBC differential and C. met. She'll receive her flu vaccine today.  Laural Benes, Anamari Galeas E, PA-C   All questions were answered. The patient knows to call the clinic with any problems, questions or concerns. We can certainly see the patient much sooner if necessary.  I spent 20 minutes counseling the patient face to face. The total time spent in the appointment was 30 minutes.

## 2012-08-10 ENCOUNTER — Encounter (INDEPENDENT_AMBULATORY_CARE_PROVIDER_SITE_OTHER): Payer: Self-pay | Admitting: Ophthalmology

## 2012-08-10 ENCOUNTER — Encounter (INDEPENDENT_AMBULATORY_CARE_PROVIDER_SITE_OTHER): Payer: Medicare Other | Admitting: Ophthalmology

## 2012-08-10 DIAGNOSIS — H35359 Cystoid macular degeneration, unspecified eye: Secondary | ICD-10-CM

## 2012-08-10 DIAGNOSIS — D3130 Benign neoplasm of unspecified choroid: Secondary | ICD-10-CM

## 2012-08-10 DIAGNOSIS — H35039 Hypertensive retinopathy, unspecified eye: Secondary | ICD-10-CM

## 2012-08-10 DIAGNOSIS — H43819 Vitreous degeneration, unspecified eye: Secondary | ICD-10-CM

## 2012-08-10 DIAGNOSIS — I10 Essential (primary) hypertension: Secondary | ICD-10-CM

## 2012-08-13 ENCOUNTER — Other Ambulatory Visit: Payer: Self-pay | Admitting: Physician Assistant

## 2012-08-13 DIAGNOSIS — D649 Anemia, unspecified: Secondary | ICD-10-CM

## 2012-08-13 DIAGNOSIS — C3410 Malignant neoplasm of upper lobe, unspecified bronchus or lung: Secondary | ICD-10-CM

## 2012-08-24 ENCOUNTER — Encounter: Payer: Self-pay | Admitting: Physician Assistant

## 2012-08-24 ENCOUNTER — Telehealth: Payer: Self-pay | Admitting: Internal Medicine

## 2012-08-24 ENCOUNTER — Other Ambulatory Visit (HOSPITAL_BASED_OUTPATIENT_CLINIC_OR_DEPARTMENT_OTHER): Payer: Medicare Other | Admitting: Lab

## 2012-08-24 ENCOUNTER — Ambulatory Visit (HOSPITAL_BASED_OUTPATIENT_CLINIC_OR_DEPARTMENT_OTHER): Payer: Medicare Other | Admitting: Physician Assistant

## 2012-08-24 VITALS — BP 180/71 | HR 70 | Temp 97.4°F | Resp 20 | Ht 67.0 in | Wt 148.5 lb

## 2012-08-24 DIAGNOSIS — C3410 Malignant neoplasm of upper lobe, unspecified bronchus or lung: Secondary | ICD-10-CM

## 2012-08-24 DIAGNOSIS — R21 Rash and other nonspecific skin eruption: Secondary | ICD-10-CM

## 2012-08-24 LAB — CBC WITH DIFFERENTIAL/PLATELET
Basophils Absolute: 0.1 10*3/uL (ref 0.0–0.1)
EOS%: 1 % (ref 0.0–7.0)
Eosinophils Absolute: 0.1 10*3/uL (ref 0.0–0.5)
HGB: 12.2 g/dL (ref 11.6–15.9)
MCV: 90.1 fL (ref 79.5–101.0)
MONO%: 13.3 % (ref 0.0–14.0)
NEUT#: 6.2 10*3/uL (ref 1.5–6.5)
RBC: 4.02 10*6/uL (ref 3.70–5.45)
RDW: 13.8 % (ref 11.2–14.5)
WBC: 8.4 10*3/uL (ref 3.9–10.3)
lymph#: 1 10*3/uL (ref 0.9–3.3)

## 2012-08-24 MED ORDER — METHYLPREDNISOLONE 4 MG PO KIT: PACK | ORAL | Status: DC

## 2012-08-24 MED ORDER — DOXYCYCLINE HYCLATE 100 MG PO TABS: 100.0000 mg | ORAL_TABLET | Freq: Two times a day (BID) | ORAL | Status: DC

## 2012-08-24 MED ORDER — CLINDAMYCIN PHOSPHATE 1 % EX LOTN: TOPICAL_LOTION | Freq: Two times a day (BID) | CUTANEOUS | Status: DC

## 2012-08-24 NOTE — Progress Notes (Signed)
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   Prior Therapy: None.   Current therapy: Tarceva at 150 mg by mouth daily status post 20 months therapy.  INTERVAL HISTORY: Kristin [name] 71 y.o. female returns to the clinic today for routine followup visit.  The patient is feeling fine today with no specific complaints. She reports worsening skin rash affecting both lower extremities, on the front and back of her legs. She states that it is quite uncomfortable and borderline painful. She requests a refill for her clindamycin lotion. She denied having any significant diarrhea.  She has no significant weight loss or night sweats. She has no chest pain, shortness breath, cough or hemoptysis. The patient overall is tolerating her treatment with Tarceva fairly well with no significant complaints.   MEDICAL HISTORY: Past Medical History  Diagnosis Date  . Hypertension   . Anemia   . Breast cancer 2002  . Lung cancer dx'd 12/2010    ALLERGIES:  is allergic to sulfa antibiotics.  MEDICATIONS:  Current Outpatient Prescriptions  Medication Sig Dispense Refill  . Bromfenac Sodium (PROLENSA) 0.07 % SOLN Apply 1 drop to eye at bedtime. In the right eye only      . amLODipine (NORVASC) 5 MG tablet Take 5 mg by mouth daily.        . Calcium Carb-Cholecalciferol (RA CALCIUM 600/VITAMIN D-3) 600-400 MG-UNIT TABS Take 2 tablets by mouth daily.        . clindamycin (CLEOCIN T) 1 % lotion Apply topically 2 (two) times daily.  60 mL  2  . cycloSPORINE (RESTASIS) 0.05 % ophthalmic emulsion Place 1 drop into both eyes 2 (two) times daily.      Marland Kitchen doxycycline (VIBRA-TABS) 100 MG tablet Take 1 tablet (100 mg total) by mouth 2 (two) times daily.  14  tablet  0  . erlotinib (TARCEVA) 150 MG tablet Take 1 tablet (150 mg total) by mouth daily.  30 tablet  2  . escitalopram (LEXAPRO) 10 MG tablet Take 10 mg by mouth daily.        . famotidine (PEPCID) 40 MG tablet Take 40 mg by mouth daily.        . Fe Fum-FePoly-FA-Vit C-Vit B3 (FOLIVANE-F PO) Take 1 capsule by mouth daily.        Marland Kitchen FeFum-FePoly-FA-B Cmp-C-Biot (FOLIVANE-PLUS) CAPS TAKE 1 CAPSULE BY MOUTH DAILY  90 capsule  0  . ibandronate (BONIVA) 150 MG tablet Take 150 mg by mouth every 30 (thirty) days. Take in the morning with a full glass of water, on an empty stomach, and do not take anything else by mouth or lie down for the next 30 min.       Marland Kitchen letrozole (FEMARA) 2.5 MG tablet Take 2.5 mg by mouth daily.        . methylPREDNISolone (MEDROL, PAK,) 4 MG tablet follow package directions  21 tablet  0  . ofloxacin (OCUFLOX) 0.3 % ophthalmic solution       . Polyethyl Glycol-Propyl Glycol (SYSTANE) 0.4-0.3 % SOLN Apply 1 drop to eye 2 (two) times daily.      . prednisoLONE acetate (PRED FORTE) 1 % ophthalmic suspension       . zolpidem (AMBIEN) 5 MG tablet at bedtime as needed.  SURGICAL HISTORY:  Past Surgical History  Procedure Date  . Appendectomy   . Mastectomy     bilateral  . Lung surgery   . Cataracts     bilateral  . Colonoscopy     REVIEW OF SYSTEMS:  A comprehensive review of systems was negative except for: Skin rash related to Tarceva that has been worse for the past 2 weeks   PHYSICAL EXAMINATION: General appearance: alert, cooperative and no distress Head: Normocephalic, without obvious abnormality, atraumatic Neck: no adenopathy Lymph nodes: Cervical, supraclavicular, and axillary nodes normal. Resp: clear to auscultation bilaterally Cardio: regular rate and rhythm, S1, S2 normal, no murmur, click, rub or gallop GI: soft, non-tender; bowel sounds normal; no masses,  no organomegaly Extremities: extremities normal, atraumatic, no cyanosis or  edema Neurologic: Alert and oriented X 3, normal strength and tone. Normal symmetric reflexes. Normal coordination and gait Skin: Examination of the lower extremities reveals scattered erythematous/inflamed lesions some of them acneiform, of varying size, over the front and back of both lower extremities. There is no evidence of any superinfection.  ECOG PERFORMANCE STATUS: 0 - Asymptomatic  Blood pressure 180/71, pulse 70, temperature 97.4 F (36.3 C), temperature source Oral, resp. rate 20, height 5\' 7"  (1.702 m), weight 148 lb 8 oz (67.359 kg).  LABORATORY DATA: Lab Results  Component Value Date   WBC 8.4 08/24/2012   HGB 12.2 08/24/2012   HCT 36.2 08/24/2012   MCV 90.1 08/24/2012   PLT 406* 08/24/2012      Chemistry      Component Value Date/Time   NA 141 07/28/2012 1441   NA 141 05/28/2012 1006   NA 147* 03/26/2012 1022   K 4.1 07/28/2012 1441   K 4.3 05/28/2012 1006   K 4.8* 03/26/2012 1022   CL 107 07/28/2012 1441   CL 108 05/28/2012 1006   CL 102 03/26/2012 1022   CO2 24 07/28/2012 1441   CO2 26 05/28/2012 1006   CO2 31 03/26/2012 1022   BUN 18.0 07/28/2012 1441   BUN 12 05/28/2012 1006   BUN 14 03/26/2012 1022   CREATININE 0.9 07/28/2012 1441   CREATININE 0.90 05/28/2012 1006   CREATININE 0.7 03/26/2012 1022      Component Value Date/Time   CALCIUM 9.5 07/28/2012 1441   CALCIUM 9.1 05/28/2012 1006   CALCIUM 9.1 03/26/2012 1022   ALKPHOS 91 07/28/2012 1441   ALKPHOS 77 05/28/2012 1006   ALKPHOS 84 03/26/2012 1022   AST 18 07/28/2012 1441   AST 21 05/28/2012 1006   AST 26 03/26/2012 1022   ALT 16 07/28/2012 1441   ALT 17 05/28/2012 1006   BILITOT 0.70 07/28/2012 1441   BILITOT 0.8 05/28/2012 1006   BILITOT 0.70 03/26/2012 1022       RADIOGRAPHIC STUDIES: Ct Chest W Contrast  06/23/2012  *RADIOLOGY REPORT*  Clinical Data:  Restaging lung carcinoma.  CT CHEST, ABDOMEN AND PELVIS WITH CONTRAST  Technique:  Multidetector CT imaging of the chest, abdomen and pelvis was performed following the standard protocol  during bolus administration of intravenous contrast.  Contrast: OMNIPAQUE IOHEXOL 300 MG/ML  SOLN  Comparison:  CT 03/26/2012   CT CHEST  Findings:  No axillary or supraclavicular lymphadenopathy.  Small thyroid nodules are unchanged.  No mediastinal lymphadenopathy. Esophagus is normal.  Large hiatal hernia (sliding type) demonstrated.  Review of the lung parenchyma demonstrates mild linear thickening at the surgical site in the left upper lobe.  No new nodularity or suspicious lesions.  Central  airways are normal.  IMPRESSION:  1.  Stable postsurgical change in the left upper lobe.  2.  No evidence of recurrence or progression in the thorax.   CT ABDOMEN AND PELVIS  Findings:  Simple cyst in the right hepatic lobe is again demonstrated.  No new hepatic lesions present.  The gallbladder, pancreas, spleen, adrenal glands, and kidneys are normal.  The small bowel and colon are normal.  Abdominal aorta is normal caliber.  No retroperitoneal or periportal lymphadenopathy.  No free fluid the pelvis.  The bladder is normal.  The uterus and ovaries are normal.  No cough and bone  IMPRESSION:  1.  No evidence of lung cancer metastasis in the abdomen pelvis.  2.  Stable hepatic cyst.  3.  Hiatal hurnia.   Original Report Authenticated By: Genevive Bi, M.D.     ASSESSMENT/PLAN: This is a very pleasant 71 years old white female with metastatic non-small cell lung cancer, adenocarcinoma with positive EGFR mutation. The patient is currently on Tarceva for the last 20 months with no significant evidence for disease progression. The patient was discussed with Dr. Arbutus Ped. Her legs are exhibiting at least a grade 2 skin rash. To address this a prescription for a Medrol Dosepak as well as a seven-day course of doxycycline and a refill for her clindamycin lotion was sent to her pharmacy of record via [name]. scribed. The patient is to contact us if her rash has not significantly improved within the next week, as she may  have to hold her Tarceva until the rash subsides. She will continue on Tarceva at 150 mg by mouth daily. She'll follow with Dr. Arbutus Ped in one month with repeat CBC differential, C. met and CT of the chest, abdomen and pelvis with contrast to reevaluate her disease.   Kristin [name], Kristin Menden E, PA-C   All questions were answered. The patient knows to call the clinic with any problems, questions or concerns. We can certainly see the patient much sooner if necessary.  I spent 20 minutes counseling the patient face to face. The total time spent in the appointment was 30 minutes.

## 2012-08-24 NOTE — Telephone Encounter (Signed)
gv and printed pt appt schedule for Nov and Dec

## 2012-08-24 NOTE — Patient Instructions (Addendum)
Take the steroid taper and antibiotics as prescribed and continue to use the clindamycin lotion as prescribed for you rash If the rash has not improved within 7 -10 days, notify us as we may need to hold your Tarceva Follow up with Dr. Arbutus Ped in 1 month with a restaging CT scan of your chest, abdomen and pelvis

## 2012-08-28 ENCOUNTER — Encounter: Payer: Self-pay | Admitting: *Deleted

## 2012-08-28 NOTE — Progress Notes (Signed)
RECEIVED A FAX FROM [facility] CONCERNING A CONFIRMATION OF PRESCRIPTION FOR TARCEVA ON 08/28/12.

## 2012-09-18 ENCOUNTER — Other Ambulatory Visit (HOSPITAL_BASED_OUTPATIENT_CLINIC_OR_DEPARTMENT_OTHER): Payer: Medicare Other | Admitting: Lab

## 2012-09-18 ENCOUNTER — Ambulatory Visit (HOSPITAL_COMMUNITY)
Admission: RE | Admit: 2012-09-18 | Discharge: 2012-09-18 | Disposition: A | Discharge status: Home / Self Care | Payer: Medicare Other | Source: Ambulatory Visit | Attending: Physician Assistant | Admitting: Physician Assistant

## 2012-09-18 ENCOUNTER — Encounter (HOSPITAL_COMMUNITY): Payer: Self-pay

## 2012-09-18 DIAGNOSIS — C3410 Malignant neoplasm of upper lobe, unspecified bronchus or lung: Secondary | ICD-10-CM

## 2012-09-18 DIAGNOSIS — K7689 Other specified diseases of liver: Secondary | ICD-10-CM | POA: Insufficient documentation

## 2012-09-18 LAB — CBC WITH DIFFERENTIAL/PLATELET
Basophils Absolute: 0.1 10*3/uL (ref 0.0–0.1)
HCT: 37.1 % (ref 34.8–46.6)
HGB: 12.3 g/dL (ref 11.6–15.9)
MONO#: 0.7 10*3/uL (ref 0.1–0.9)
NEUT#: 3.5 10*3/uL (ref 1.5–6.5)
NEUT%: 61.6 % (ref 38.4–76.8)
WBC: 5.6 10*3/uL (ref 3.9–10.3)
lymph#: 1.2 10*3/uL (ref 0.9–3.3)

## 2012-09-18 LAB — COMPREHENSIVE METABOLIC PANEL (CC13)
ALT: 21 U/L (ref 0–55)
Albumin: 3.2 g/dL — ABNORMAL LOW (ref 3.5–5.0)
BUN: 19 mg/dL (ref 7.0–26.0)
CO2: 30 mEq/L — ABNORMAL HIGH (ref 22–29)
Calcium: 9.4 mg/dL (ref 8.4–10.4)
Chloride: 106 mEq/L (ref 98–107)
Creatinine: 0.9 mg/dL (ref 0.6–1.1)
Potassium: 4.1 mEq/L (ref 3.5–5.1)

## 2012-09-18 MED ORDER — IOHEXOL 300 MG/ML  SOLN
100.0000 mL | Freq: Once | INTRAMUSCULAR | Status: AC | PRN
  Administered 2012-09-18: 100 mL via INTRAVENOUS

## 2012-09-21 ENCOUNTER — Telehealth: Payer: Self-pay | Admitting: Internal Medicine

## 2012-09-21 ENCOUNTER — Ambulatory Visit (HOSPITAL_BASED_OUTPATIENT_CLINIC_OR_DEPARTMENT_OTHER): Payer: Medicare Other | Admitting: Internal Medicine

## 2012-09-21 VITALS — BP 152/64 | HR 82 | Temp 97.0°F | Resp 18 | Ht 67.0 in | Wt 148.0 lb

## 2012-09-21 DIAGNOSIS — C3410 Malignant neoplasm of upper lobe, unspecified bronchus or lung: Secondary | ICD-10-CM

## 2012-09-21 NOTE — Patient Instructions (Addendum)
Your CT scan showed no evidence for disease progression. Continue treatment with Tarceva Followup in 6 weeks

## 2012-09-21 NOTE — Progress Notes (Signed)
[facility] [phone]   Fax:([phone]) [phone]  OFFICE PROGRESS NOTE  [name], MD [address] 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   Prior Therapy: None.   Current therapy: Tarceva at 150 mg by mouth daily status post 21 months therapy.  INTERVAL HISTORY: Kristin [name] 71 y.o. female returns to the clinic today for followup visit accompanied by her daughter-in-law. The patient is feeling fine today with no specific complaints. She denied having any significant weight loss or night sweats. She has no chest pain, shortness of breath, cough or hemoptysis. The patient had repeat CT scan of the chest, abdomen and pelvis performed recently and she is here today for evaluation and discussion of her scan results. She's tolerating her treatment was Tarceva fairly well with no significant adverse effects.  MEDICAL HISTORY: Past Medical History  Diagnosis Date  . Hypertension   . Anemia   . Breast cancer 2002  . Lung cancer dx'd 12/2010    ALLERGIES:  is allergic to sulfa antibiotics.  MEDICATIONS:  Current Outpatient Prescriptions  Medication Sig Dispense Refill  . amLODipine (NORVASC) 5 MG tablet Take 5 mg by mouth daily.        . Bromfenac Sodium (PROLENSA) 0.07 % SOLN Apply 1 drop to eye at bedtime. In the right eye only      . Calcium Carb-Cholecalciferol (RA CALCIUM 600/VITAMIN D-3) 600-400 MG-UNIT TABS Take 2 tablets by mouth daily.        . clindamycin (CLEOCIN T) 1 % lotion Apply topically 2 (two) times daily.  60 mL  2  . cycloSPORINE (RESTASIS) 0.05 % ophthalmic emulsion Place 1 drop into both eyes 2 (two) times daily.      Marland Kitchen doxycycline (VIBRA-TABS) 100 MG tablet Take 1 tablet (100 mg total) by mouth 2 (two) times daily.  14 tablet  0  . erlotinib (TARCEVA) 150 MG tablet Take 1 tablet (150 mg total) by  mouth daily.  30 tablet  2  . escitalopram (LEXAPRO) 10 MG tablet Take 10 mg by mouth daily.        . famotidine (PEPCID) 40 MG tablet Take 40 mg by mouth daily.        . Fe Fum-FePoly-FA-Vit C-Vit B3 (FOLIVANE-F PO) Take 1 capsule by mouth daily.        Marland Kitchen FeFum-FePoly-FA-B Cmp-C-Biot (FOLIVANE-PLUS) CAPS TAKE 1 CAPSULE BY MOUTH DAILY  90 capsule  0  . ibandronate (BONIVA) 150 MG tablet Take 150 mg by mouth every 30 (thirty) days. Take in the morning with a full glass of water, on an empty stomach, and do not take anything else by mouth or lie down for the next 30 min.       Marland Kitchen letrozole (FEMARA) 2.5 MG tablet Take 2.5 mg by mouth daily.        . methylPREDNISolone (MEDROL, PAK,) 4 MG tablet follow package directions  21 tablet  0  . Polyethyl Glycol-Propyl Glycol (SYSTANE) 0.4-0.3 % SOLN Apply 1 drop to eye 2 (two) times daily.      . prednisoLONE acetate (PRED FORTE) 1 % ophthalmic suspension       . zolpidem (AMBIEN) 5 MG tablet at bedtime as needed.         SURGICAL HISTORY:  Past Surgical History  Procedure Date  . Appendectomy   . Mastectomy  bilateral  . Lung surgery   . Cataracts     bilateral  . Colonoscopy     REVIEW OF SYSTEMS:  A comprehensive review of systems was negative.   PHYSICAL EXAMINATION: General appearance: alert, cooperative and no distress Head: Normocephalic, without obvious abnormality, atraumatic Neck: no adenopathy Lymph nodes: Cervical, supraclavicular, and axillary nodes normal. Resp: clear to auscultation bilaterally Cardio: regular rate and rhythm, S1, S2 normal, no murmur, click, rub or gallop GI: soft, non-tender; bowel sounds normal; no masses,  no organomegaly Extremities: extremities normal, atraumatic, no cyanosis or edema Neurologic: Alert and oriented X 3, normal strength and tone. Normal symmetric reflexes. Normal coordination and gait  ECOG PERFORMANCE STATUS: 0 - Asymptomatic  Blood pressure 152/64, pulse 82, temperature 97 F (36.1  C), temperature source Oral, resp. rate 18, height 5\' 7"  (1.702 m), weight 148 lb (67.132 kg).  LABORATORY DATA: Lab Results  Component Value Date   WBC 5.6 09/18/2012   HGB 12.3 09/18/2012   HCT 37.1 09/18/2012   MCV 90.2 09/18/2012   PLT 369 09/18/2012      Chemistry      Component Value Date/Time   NA 140 09/18/2012 0920   NA 141 05/28/2012 1006   NA 147* 03/26/2012 1022   K 4.1 09/18/2012 0920   K 4.3 05/28/2012 1006   K 4.8* 03/26/2012 1022   CL 106 09/18/2012 0920   CL 108 05/28/2012 1006   CL 102 03/26/2012 1022   CO2 30* 09/18/2012 0920   CO2 26 05/28/2012 1006   CO2 31 03/26/2012 1022   BUN 19.0 09/18/2012 0920   BUN 12 05/28/2012 1006   BUN 14 03/26/2012 1022   CREATININE 0.9 09/18/2012 0920   CREATININE 0.90 05/28/2012 1006   CREATININE 0.7 03/26/2012 1022      Component Value Date/Time   CALCIUM 9.4 09/18/2012 0920   CALCIUM 9.1 05/28/2012 1006   CALCIUM 9.1 03/26/2012 1022   ALKPHOS 78 09/18/2012 0920   ALKPHOS 77 05/28/2012 1006   ALKPHOS 84 03/26/2012 1022   AST 19 09/18/2012 0920   AST 21 05/28/2012 1006   AST 26 03/26/2012 1022   ALT 21 09/18/2012 0920   ALT 17 05/28/2012 1006   BILITOT 0.62 09/18/2012 0920   BILITOT 0.8 05/28/2012 1006   BILITOT 0.70 03/26/2012 1022       RADIOGRAPHIC STUDIES: Ct Chest W Contrast  09/18/2012  *RADIOLOGY REPORT*  Clinical Data:  Lung cancer, restaging.  CT CHEST, ABDOMEN AND PELVIS WITH CONTRAST  Technique:  Multidetector CT imaging of the chest, abdomen and pelvis was performed following the standard protocol during bolus administration of intravenous contrast.  Contrast: OMNIPAQUE IOHEXOL 300 MG/ML  SOLN  Comparison:  06/23/2012   CT CHEST  Findings:  Postoperative changes are again noted in the left upper lobe, stable.  No recurrent residual mass.  No pulmonary nodules or pleural effusions.  Large hiatal hernia again noted, stable. Heart is normal size. Aorta is normal caliber. No mediastinal, hilar, or axillary adenopathy.  Surgical clips  noted in the right breast and right axilla.  Small scattered thyroid nodules again noted, unchanged.  Chest wall soft tissues are unremarkable.  No acute or focal bony abnormality.  IMPRESSION: Stable postoperative changes.  No acute findings.   CT ABDOMEN AND PELVIS  Findings:  Stable cyst in the right hepatic lobe.  No new or suspicious liver lesion.  Spleen, stomach, pancreas, gallbladder, adrenals and kidneys are unremarkable.  There is an 18 mm  polypoid filling defect within the ascending colon on image 85.  While this could represent stool, this is persistent since prior study.  I cannot exclude colonic polypoid lesion.  Consider colonoscopy for further evaluation.  Small bowel is decompressed.  Calcifications within the uterus, likely small calcified fibroids. No adnexal masses or free fluid.  No adenopathy.  Urinary bladder is grossly unremarkable.  No acute bony abnormality.  IMPRESSION: Stable hepatic cyst.  Polypoid filling defect in the ascending colon.  This is stable dating back to prior study and therefore less likely to represent stool.  Cannot exclude polypoid lesion.  Recommend correlation with any prior colonoscopies and repeat colonoscopy if the patient has not had a recent one.   Original Report Authenticated By: Charlett Nose, M.D.     ASSESSMENT: This is a very pleasant 71 years old white female with history of metastatic non-small cell lung cancer, adenocarcinoma with positive EGFR mutation, currently on treatment with Tarceva for the last 21 month with no evidence for disease progression.  PLAN: I discussed the scan results with the patient and her daughter-in-law today. I recommended for her to continue treatment was Tarceva at the current dose. She would come back for followup visit in 6 weeks for reevaluation and repeat blood work.  She was advised to call me immediately if she has any concerning symptoms in the interval.  All questions were answered. The patient knows to call the  clinic with any problems, questions or concerns. We can certainly see the patient much sooner if necessary.  I spent 15 minutes counseling the patient face to face. The total time spent in the appointment was 25 minutes.

## 2012-09-21 NOTE — Telephone Encounter (Signed)
appts made and printed for pt aom °

## 2012-09-22 ENCOUNTER — Telehealth: Payer: Self-pay | Admitting: *Deleted

## 2012-09-22 NOTE — Telephone Encounter (Signed)
Note from Dr Donnald Garre given to Theola Sequin stating she was at office visit on 09/21/12 with pt.  SLJ

## 2012-09-23 ENCOUNTER — Encounter (INDEPENDENT_AMBULATORY_CARE_PROVIDER_SITE_OTHER): Payer: Medicare Other | Admitting: Ophthalmology

## 2012-09-23 DIAGNOSIS — H43819 Vitreous degeneration, unspecified eye: Secondary | ICD-10-CM

## 2012-09-23 DIAGNOSIS — H35359 Cystoid macular degeneration, unspecified eye: Secondary | ICD-10-CM

## 2012-09-23 DIAGNOSIS — I10 Essential (primary) hypertension: Secondary | ICD-10-CM

## 2012-09-23 DIAGNOSIS — H35039 Hypertensive retinopathy, unspecified eye: Secondary | ICD-10-CM

## 2012-09-23 DIAGNOSIS — D3130 Benign neoplasm of unspecified choroid: Secondary | ICD-10-CM

## 2012-10-20 ENCOUNTER — Telehealth: Payer: Self-pay | Admitting: Internal Medicine

## 2012-10-20 NOTE — Telephone Encounter (Signed)
called pt and i changed her time to make room for a tx pt,     anne

## 2012-10-27 ENCOUNTER — Other Ambulatory Visit: Payer: Self-pay | Admitting: *Deleted

## 2012-10-27 DIAGNOSIS — C3490 Malignant neoplasm of unspecified part of unspecified bronchus or lung: Secondary | ICD-10-CM

## 2012-10-27 MED ORDER — ERLOTINIB HCL 150 MG PO TABS: 150.0000 mg | ORAL_TABLET | Freq: Every day | ORAL | Status: DC

## 2012-10-30 NOTE — Telephone Encounter (Signed)
RECEIVED A FAX FROM [facility] CONCERNING PRESCRIPTION BENEFITS. PT.'S COPAY IS ZERO. ALSO ANOTHER FAX STATED TARCEVA WILL BE SHIPPED ON 11/03/12.

## 2012-11-02 ENCOUNTER — Other Ambulatory Visit (HOSPITAL_BASED_OUTPATIENT_CLINIC_OR_DEPARTMENT_OTHER): Payer: Medicare Other | Admitting: Lab

## 2012-11-02 ENCOUNTER — Encounter: Payer: Self-pay | Admitting: Physician Assistant

## 2012-11-02 ENCOUNTER — Telehealth: Payer: Self-pay | Admitting: Internal Medicine

## 2012-11-02 ENCOUNTER — Other Ambulatory Visit: Payer: Medicare Other | Admitting: Lab

## 2012-11-02 ENCOUNTER — Ambulatory Visit (HOSPITAL_BASED_OUTPATIENT_CLINIC_OR_DEPARTMENT_OTHER): Payer: Medicare Other | Admitting: Physician Assistant

## 2012-11-02 ENCOUNTER — Ambulatory Visit: Payer: Medicare Other | Admitting: Physician Assistant

## 2012-11-02 VITALS — BP 165/65 | HR 62 | Temp 98.0°F | Resp 20 | Ht 67.0 in | Wt 145.5 lb

## 2012-11-02 DIAGNOSIS — C3410 Malignant neoplasm of upper lobe, unspecified bronchus or lung: Secondary | ICD-10-CM

## 2012-11-02 LAB — COMPREHENSIVE METABOLIC PANEL (CC13)
Albumin: 3.2 g/dL — ABNORMAL LOW (ref 3.5–5.0)
Alkaline Phosphatase: 72 U/L (ref 40–150)
BUN: 15 mg/dL (ref 7.0–26.0)
Glucose: 89 mg/dl (ref 70–99)
Total Bilirubin: 0.52 mg/dL (ref 0.20–1.20)

## 2012-11-02 LAB — CBC WITH DIFFERENTIAL/PLATELET
Basophils Absolute: 0.1 10*3/uL (ref 0.0–0.1)
Eosinophils Absolute: 0.1 10*3/uL (ref 0.0–0.5)
HGB: 12 g/dL (ref 11.6–15.9)
LYMPH%: 20.3 % (ref 14.0–49.7)
MCV: 89.1 fL (ref 79.5–101.0)
MONO%: 9.7 % (ref 0.0–14.0)
NEUT#: 4.3 10*3/uL (ref 1.5–6.5)
NEUT%: 67.7 % (ref 38.4–76.8)
Platelets: 364 10*3/uL (ref 145–400)

## 2012-11-02 NOTE — Patient Instructions (Addendum)
Continue taking Tarceva 150 mg by mouth daily Follow up in 1 month 

## 2012-11-02 NOTE — Telephone Encounter (Signed)
Gave pt appt for lab and MD on February 2014 °

## 2012-11-04 NOTE — Progress Notes (Signed)
[facility] [phone]   Fax:(336) [phone]  OFFICE PROGRESS NOTE  [name], MD [address] 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   Prior Therapy: None.   Current therapy: Tarceva at 150 mg by mouth daily status post 22 months therapy.  INTERVAL HISTORY: Kristin [name] 72 y.o. female returns to the clinic today for followup visit.  The patient is feeling fine today with no specific complaints. She denied having any significant weight loss or night sweats. She has no chest pain, shortness of breath, cough or hemoptysis.  She's tolerating her treatment was Tarceva fairly well with no significant adverse effects. Her rash is significantly better. She is experiencing dry skin. She is not having any problems with diarrhea at this time.  MEDICAL HISTORY: Past Medical History  Diagnosis Date  . Hypertension   . Anemia   . Breast cancer 2002  . Lung cancer dx'd 12/2010    ALLERGIES:  is allergic to sulfa antibiotics.  MEDICATIONS:  Current Outpatient Prescriptions  Medication Sig Dispense Refill  . amLODipine (NORVASC) 5 MG tablet Take 5 mg by mouth daily.        . Bromfenac Sodium (PROLENSA) 0.07 % SOLN Apply 1 drop to eye at bedtime. In the right eye only      . Calcium Carb-Cholecalciferol (RA CALCIUM 600/VITAMIN D-3) 600-400 MG-UNIT TABS Take 2 tablets by mouth daily.        . clindamycin (CLEOCIN T) 1 % lotion Apply topically 2 (two) times daily.  60 mL  2  . cycloSPORINE (RESTASIS) 0.05 % ophthalmic emulsion Place 1 drop into both eyes 2 (two) times daily.      Marland Kitchen erlotinib (TARCEVA) 150 MG tablet Take 1 tablet (150 mg total) by mouth daily.  30 tablet  1  . escitalopram (LEXAPRO) 10 MG tablet Take 10 mg by mouth daily.        . famotidine (PEPCID) 40 MG tablet Take 40 mg by mouth daily.        Marland Kitchen  FeFum-FePoly-FA-B Cmp-C-Biot (FOLIVANE-PLUS) CAPS TAKE 1 CAPSULE BY MOUTH DAILY  90 capsule  0  . ibandronate (BONIVA) 150 MG tablet Take 150 mg by mouth every 30 (thirty) days. Take in the morning with a full glass of water, on an empty stomach, and do not take anything else by mouth or lie down for the next 30 min.       Marland Kitchen letrozole (FEMARA) 2.5 MG tablet Take 2.5 mg by mouth daily.        Bertram Gala Glycol-Propyl Glycol (SYSTANE) 0.4-0.3 % SOLN Apply 1 drop to eye 2 (two) times daily.      . prednisoLONE acetate (PRED FORTE) 1 % ophthalmic suspension       . zolpidem (AMBIEN) 5 MG tablet at bedtime as needed.         SURGICAL HISTORY:  Past Surgical History  Procedure Date  . Appendectomy   . Mastectomy     bilateral  . Lung surgery   . Cataracts     bilateral  . Colonoscopy     REVIEW OF SYSTEMS:  A comprehensive review of systems was negative.   PHYSICAL EXAMINATION: General appearance: alert, cooperative and no distress Head: Normocephalic, without obvious abnormality, atraumatic Neck: no adenopathy Lymph nodes: Cervical, supraclavicular, and axillary nodes normal. Resp: clear to auscultation bilaterally Cardio: regular rate  and rhythm, S1, S2 normal, no murmur, click, rub or gallop GI: soft, non-tender; bowel sounds normal; no masses,  no organomegaly Extremities: extremities normal, atraumatic, no cyanosis or edema Neurologic: Alert and oriented X 3, normal strength and tone. Normal symmetric reflexes. Normal coordination and gait  ECOG PERFORMANCE STATUS: 0 - Asymptomatic  Blood pressure 165/65, pulse 62, temperature 98 F (36.7 C), temperature source Oral, resp. rate 20, height 5\' 7"  (1.702 m), weight 145 lb 8 oz (65.998 kg).  LABORATORY DATA: Lab Results  Component Value Date   WBC 6.3 11/02/2012   HGB 12.0 11/02/2012   HCT 36.4 11/02/2012   MCV 89.1 11/02/2012   PLT 364 11/02/2012      Chemistry      Component Value Date/Time   NA 139 11/02/2012 1409   NA 141  05/28/2012 1006   NA 147* 03/26/2012 1022   K 3.9 11/02/2012 1409   K 4.3 05/28/2012 1006   K 4.8* 03/26/2012 1022   CL 106 11/02/2012 1409   CL 108 05/28/2012 1006   CL 102 03/26/2012 1022   CO2 30* 11/02/2012 1409   CO2 26 05/28/2012 1006   CO2 31 03/26/2012 1022   BUN 15.0 11/02/2012 1409   BUN 12 05/28/2012 1006   BUN 14 03/26/2012 1022   CREATININE 0.9 11/02/2012 1409   CREATININE 0.90 05/28/2012 1006   CREATININE 0.7 03/26/2012 1022      Component Value Date/Time   CALCIUM 9.3 11/02/2012 1409   CALCIUM 9.1 05/28/2012 1006   CALCIUM 9.1 03/26/2012 1022   ALKPHOS 72 11/02/2012 1409   ALKPHOS 77 05/28/2012 1006   ALKPHOS 84 03/26/2012 1022   AST 16 11/02/2012 1409   AST 21 05/28/2012 1006   AST 26 03/26/2012 1022   ALT 14 11/02/2012 1409   ALT 17 05/28/2012 1006   BILITOT 0.52 11/02/2012 1409   BILITOT 0.8 05/28/2012 1006   BILITOT 0.70 03/26/2012 1022       RADIOGRAPHIC STUDIES: Ct Chest W Contrast  09/18/2012  *RADIOLOGY REPORT*  Clinical Data:  Lung cancer, restaging.  CT CHEST, ABDOMEN AND PELVIS WITH CONTRAST  Technique:  Multidetector CT imaging of the chest, abdomen and pelvis was performed following the standard protocol during bolus administration of intravenous contrast.  Contrast: OMNIPAQUE IOHEXOL 300 MG/ML  SOLN  Comparison:  06/23/2012   CT CHEST  Findings:  Postoperative changes are again noted in the left upper lobe, stable.  No recurrent residual mass.  No pulmonary nodules or pleural effusions.  Large hiatal hernia again noted, stable. Heart is normal size. Aorta is normal caliber. No mediastinal, hilar, or axillary adenopathy.  Surgical clips noted in the right breast and right axilla.  Small scattered thyroid nodules again noted, unchanged.  Chest wall soft tissues are unremarkable.  No acute or focal bony abnormality.  IMPRESSION: Stable postoperative changes.  No acute findings.   CT ABDOMEN AND PELVIS  Findings:  Stable cyst in the right hepatic lobe.  No new or suspicious liver lesion.   Spleen, stomach, pancreas, gallbladder, adrenals and kidneys are unremarkable.  There is an 18 mm polypoid filling defect within the ascending colon on image 85.  While this could represent stool, this is persistent since prior study.  I cannot exclude colonic polypoid lesion.  Consider colonoscopy for further evaluation.  Small bowel is decompressed.  Calcifications within the uterus, likely small calcified fibroids. No adnexal masses or free fluid.  No adenopathy.  Urinary bladder is grossly unremarkable.  No  acute bony abnormality.  IMPRESSION: Stable hepatic cyst.  Polypoid filling defect in the ascending colon.  This is stable dating back to prior study and therefore less likely to represent stool.  Cannot exclude polypoid lesion.  Recommend correlation with any prior colonoscopies and repeat colonoscopy if the patient has not had a recent one.   Original Report Authenticated By: Charlett Nose, M.D.     ASSESSMENT/PLAN: This is a very pleasant 72 years old white female with history of metastatic non-small cell lung cancer, adenocarcinoma with positive EGFR mutation, currently on treatment with Tarceva for the last 22 month with no evidence for disease progression. The patient was discussed with Dr. Arbutus Ped. She will continue taking Tarceva at 150 mg by mouth daily. She will return in one month for another symptom management visit.  Laural Benes, Kristin Macquarrie E, PA-C   All questions were answered. The patient knows to call the clinic with any problems, questions or concerns. We can certainly see the patient much sooner if necessary.  I spent 20 minutes counseling the patient face to face. The total time spent in the appointment was 30 minutes.

## 2012-11-05 ENCOUNTER — Ambulatory Visit (INDEPENDENT_AMBULATORY_CARE_PROVIDER_SITE_OTHER): Payer: Medicare Other | Admitting: Ophthalmology

## 2012-11-05 DIAGNOSIS — I10 Essential (primary) hypertension: Secondary | ICD-10-CM

## 2012-11-05 DIAGNOSIS — H35039 Hypertensive retinopathy, unspecified eye: Secondary | ICD-10-CM

## 2012-11-05 DIAGNOSIS — H43819 Vitreous degeneration, unspecified eye: Secondary | ICD-10-CM

## 2012-11-05 DIAGNOSIS — D3130 Benign neoplasm of unspecified choroid: Secondary | ICD-10-CM

## 2012-11-05 DIAGNOSIS — H35359 Cystoid macular degeneration, unspecified eye: Secondary | ICD-10-CM

## 2012-11-30 ENCOUNTER — Other Ambulatory Visit (HOSPITAL_BASED_OUTPATIENT_CLINIC_OR_DEPARTMENT_OTHER): Payer: Medicare Other | Admitting: Lab

## 2012-11-30 ENCOUNTER — Encounter: Payer: Self-pay | Admitting: Physician Assistant

## 2012-11-30 ENCOUNTER — Ambulatory Visit (HOSPITAL_BASED_OUTPATIENT_CLINIC_OR_DEPARTMENT_OTHER): Payer: Medicare Other | Admitting: Physician Assistant

## 2012-11-30 ENCOUNTER — Telehealth: Payer: Self-pay | Admitting: Internal Medicine

## 2012-11-30 VITALS — BP 165/70 | HR 66 | Temp 97.9°F | Resp 18 | Ht 67.0 in | Wt 147.9 lb

## 2012-11-30 DIAGNOSIS — C3410 Malignant neoplasm of upper lobe, unspecified bronchus or lung: Secondary | ICD-10-CM

## 2012-11-30 LAB — COMPREHENSIVE METABOLIC PANEL (CC13)
Albumin: 3.3 g/dL — ABNORMAL LOW (ref 3.5–5.0)
Alkaline Phosphatase: 69 U/L (ref 40–150)
BUN: 14 mg/dL (ref 7.0–26.0)
CO2: 27 mEq/L (ref 22–29)
Calcium: 9.5 mg/dL (ref 8.4–10.4)
Glucose: 93 mg/dl (ref 70–99)
Potassium: 4.4 mEq/L (ref 3.5–5.1)

## 2012-11-30 LAB — CBC WITH DIFFERENTIAL/PLATELET
Basophils Absolute: 0.1 10*3/uL (ref 0.0–0.1)
Eosinophils Absolute: 0.1 10*3/uL (ref 0.0–0.5)
HGB: 12.3 g/dL (ref 11.6–15.9)
MCV: 90.2 fL (ref 79.5–101.0)
MONO#: 0.8 10*3/uL (ref 0.1–0.9)
MONO%: 12.3 % (ref 0.0–14.0)
NEUT#: 4.5 10*3/uL (ref 1.5–6.5)
Platelets: 366 10*3/uL (ref 145–400)
RDW: 14.7 % — ABNORMAL HIGH (ref 11.2–14.5)

## 2012-11-30 NOTE — Patient Instructions (Addendum)
Continue taking Tarceva 150 mg by mouth daily Follow up with Dr. Arbutus Ped in 1 month with a restaging CT scan of your chest

## 2012-11-30 NOTE — Progress Notes (Signed)
[facility] Telephone:(336) [phone]   Fax:(336) [phone]  OFFICE PROGRESS NOTE  [name], MD [address] 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   Prior Therapy: None.   Current therapy: Tarceva at 150 mg by mouth daily status post 23 months therapy.  INTERVAL HISTORY: Kristin [name] 72 y.o. female returns to the clinic today for followup visit.  The patient is feeling fine today with no specific complaints today. She does report that she had about 3/2 days of dizziness not associated with any other symptoms. She believes was a virus. She's had no repeat of the symptoms. She has some skin eruptions on her left ankle and a few lesions on her scalp as well as some episodic bouts of diarrhea. She overall continues to tolerate the Tarceva relatively well. She denied having any significant weight loss or night sweats. She has no chest pain, shortness of breath, cough or hemoptysis.    MEDICAL HISTORY: Past Medical History  Diagnosis Date  . Hypertension   . Anemia   . Breast cancer 2002  . Lung cancer dx'd 12/2010    ALLERGIES:  is allergic to sulfa antibiotics.  MEDICATIONS:  Current Outpatient Prescriptions  Medication Sig Dispense Refill  . amLODipine (NORVASC) 5 MG tablet Take 5 mg by mouth daily.        . Bromfenac Sodium (PROLENSA) 0.07 % SOLN Apply 1 drop to eye at bedtime. In the right eye only      . Calcium Carb-Cholecalciferol (RA CALCIUM 600/VITAMIN D-3) 600-400 MG-UNIT TABS Take 2 tablets by mouth daily.        . clindamycin (CLEOCIN T) 1 % lotion Apply topically 2 (two) times daily.  60 mL  2  . cycloSPORINE (RESTASIS) 0.05 % ophthalmic emulsion Place 1 drop into both eyes 2 (two) times daily.      Marland Kitchen erlotinib (TARCEVA) 150 MG tablet Take 1 tablet (150 mg total) by mouth daily.  30 tablet  1  . escitalopram  (LEXAPRO) 10 MG tablet Take 10 mg by mouth daily.        . famotidine (PEPCID) 40 MG tablet Take 40 mg by mouth daily.        Marland Kitchen FeFum-FePoly-FA-B Cmp-C-Biot (FOLIVANE-PLUS) CAPS TAKE 1 CAPSULE BY MOUTH DAILY  90 capsule  0  . ibandronate (BONIVA) 150 MG tablet Take 150 mg by mouth every 30 (thirty) days. Take in the morning with a full glass of water, on an empty stomach, and do not take anything else by mouth or lie down for the next 30 min.       Marland Kitchen letrozole (FEMARA) 2.5 MG tablet Take 2.5 mg by mouth daily.        Bertram Gala Glycol-Propyl Glycol (SYSTANE) 0.4-0.3 % SOLN Apply 1 drop to eye 2 (two) times daily.      . prednisoLONE acetate (PRED FORTE) 1 % ophthalmic suspension       . zolpidem (AMBIEN) 5 MG tablet at bedtime as needed.        No current facility-administered medications for this visit.    SURGICAL HISTORY:  Past Surgical History  Procedure Laterality Date  . Appendectomy    . Mastectomy      bilateral  . Lung surgery    . Cataracts      bilateral  . Colonoscopy      REVIEW OF  SYSTEMS:  A comprehensive review of systems was negative.   PHYSICAL EXAMINATION: General appearance: alert, cooperative and no distress Head: Normocephalic, without obvious abnormality, atraumatic Neck: no adenopathy Lymph nodes: Cervical, supraclavicular, and axillary nodes normal. Resp: clear to auscultation bilaterally Cardio: regular rate and rhythm, S1, S2 normal, no murmur, click, rub or gallop GI: soft, non-tender; bowel sounds normal; no masses,  no organomegaly Extremities: extremities normal, atraumatic, no cyanosis or edema Neurologic: Alert and oriented X 3, normal strength and tone. Normal symmetric reflexes. Normal coordination and gait Skin: left ankle with a few erythematous acneform erutions, no evidence for super-infection  ECOG PERFORMANCE STATUS: 0 - Asymptomatic  Blood pressure 165/70, pulse 66, temperature 97.9 F (36.6 C), temperature source Oral, resp. rate 18,  height 5\' 7"  (1.702 m), weight 147 lb 14.4 oz (67.087 kg).  LABORATORY DATA: Lab Results  Component Value Date   WBC 6.4 11/30/2012   HGB 12.3 11/30/2012   HCT 37.1 11/30/2012   MCV 90.2 11/30/2012   PLT 366 11/30/2012      Chemistry      Component Value Date/Time   NA 141 11/30/2012 0935   NA 141 05/28/2012 1006   NA 147* 03/26/2012 1022   K 4.4 11/30/2012 0935   K 4.3 05/28/2012 1006   K 4.8* 03/26/2012 1022   CL 107 11/30/2012 0935   CL 108 05/28/2012 1006   CL 102 03/26/2012 1022   CO2 27 11/30/2012 0935   CO2 26 05/28/2012 1006   CO2 31 03/26/2012 1022   BUN 14.0 11/30/2012 0935   BUN 12 05/28/2012 1006   BUN 14 03/26/2012 1022   CREATININE 0.8 11/30/2012 0935   CREATININE 0.90 05/28/2012 1006   CREATININE 0.7 03/26/2012 1022      Component Value Date/Time   CALCIUM 9.5 11/30/2012 0935   CALCIUM 9.1 05/28/2012 1006   CALCIUM 9.1 03/26/2012 1022   ALKPHOS 69 11/30/2012 0935   ALKPHOS 77 05/28/2012 1006   ALKPHOS 84 03/26/2012 1022   AST 19 11/30/2012 0935   AST 21 05/28/2012 1006   AST 26 03/26/2012 1022   ALT 22 11/30/2012 0935   ALT 17 05/28/2012 1006   BILITOT 0.76 11/30/2012 0935   BILITOT 0.8 05/28/2012 1006   BILITOT 0.70 03/26/2012 1022       RADIOGRAPHIC STUDIES: Ct Chest W Contrast  09/18/2012  *RADIOLOGY REPORT*  Clinical Data:  Lung cancer, restaging.  CT CHEST, ABDOMEN AND PELVIS WITH CONTRAST  Technique:  Multidetector CT imaging of the chest, abdomen and pelvis was performed following the standard protocol during bolus administration of intravenous contrast.  Contrast: OMNIPAQUE IOHEXOL 300 MG/ML  SOLN  Comparison:  06/23/2012   CT CHEST  Findings:  Postoperative changes are again noted in the left upper lobe, stable.  No recurrent residual mass.  No pulmonary nodules or pleural effusions.  Large hiatal hernia again noted, stable. Heart is normal size. Aorta is normal caliber. No mediastinal, hilar, or axillary adenopathy.  Surgical clips noted in the right breast and right axilla.  Small  scattered thyroid nodules again noted, unchanged.  Chest wall soft tissues are unremarkable.  No acute or focal bony abnormality.  IMPRESSION: Stable postoperative changes.  No acute findings.   CT ABDOMEN AND PELVIS  Findings:  Stable cyst in the right hepatic lobe.  No new or suspicious liver lesion.  Spleen, stomach, pancreas, gallbladder, adrenals and kidneys are unremarkable.  There is an 18 mm polypoid filling defect within the ascending colon on  image 85.  While this could represent stool, this is persistent since prior study.  I cannot exclude colonic polypoid lesion.  Consider colonoscopy for further evaluation.  Small bowel is decompressed.  Calcifications within the uterus, likely small calcified fibroids. No adnexal masses or free fluid.  No adenopathy.  Urinary bladder is grossly unremarkable.  No acute bony abnormality.  IMPRESSION: Stable hepatic cyst.  Polypoid filling defect in the ascending colon.  This is stable dating back to prior study and therefore less likely to represent stool.  Cannot exclude polypoid lesion.  Recommend correlation with any prior colonoscopies and repeat colonoscopy if the patient has not had a recent one.   Original Report Authenticated By: Charlett Nose, M.D.     ASSESSMENT/PLAN: This is a very pleasant 72 years old white female with history of metastatic non-small cell lung cancer, adenocarcinoma with positive EGFR mutation, currently on treatment with Tarceva for the last 23 month with no evidence for disease progression on her last restaging CT scan. The patient was discussed with Dr. Arbutus Ped. She will continue taking Tarceva at 150 mg by mouth daily. She'll follow with Dr. Arbutus Ped in one month with repeat CBC differential, C. met and CT the chest without contrast to reevaluate her disease. Her previous CT scans have shown her disease to be primarily in the chest. She had significant difficulty with the oral contrast with GI symptoms.   Laural Benes, Makylee Sanborn E,  PA-C   All questions were answered. The patient knows to call the clinic with any problems, questions or concerns. We can certainly see the patient much sooner if necessary.  I spent 20 minutes counseling the patient face to face. The total time spent in the appointment was 30 minutes.

## 2012-11-30 NOTE — Telephone Encounter (Signed)
GAve pt appt for lab and MD after CT on March 2014

## 2012-12-14 ENCOUNTER — Other Ambulatory Visit: Payer: Self-pay | Admitting: Physician Assistant

## 2012-12-23 ENCOUNTER — Encounter (INDEPENDENT_AMBULATORY_CARE_PROVIDER_SITE_OTHER): Payer: Medicare Other | Admitting: Ophthalmology

## 2012-12-23 DIAGNOSIS — H35039 Hypertensive retinopathy, unspecified eye: Secondary | ICD-10-CM

## 2012-12-23 DIAGNOSIS — D3130 Benign neoplasm of unspecified choroid: Secondary | ICD-10-CM

## 2012-12-23 DIAGNOSIS — I10 Essential (primary) hypertension: Secondary | ICD-10-CM

## 2012-12-25 ENCOUNTER — Ambulatory Visit (HOSPITAL_COMMUNITY): Payer: Medicare Other

## 2012-12-25 ENCOUNTER — Other Ambulatory Visit: Payer: Medicare Other

## 2012-12-28 ENCOUNTER — Other Ambulatory Visit: Payer: Self-pay | Admitting: Medical Oncology

## 2012-12-28 ENCOUNTER — Telehealth: Payer: Self-pay | Admitting: Internal Medicine

## 2012-12-28 ENCOUNTER — Ambulatory Visit: Payer: Medicare Other | Admitting: Internal Medicine

## 2012-12-28 MED ORDER — ERLOTINIB HCL 150 MG PO TABS: 150.0000 mg | ORAL_TABLET | Freq: Every day | ORAL | Status: DC

## 2012-12-31 NOTE — Telephone Encounter (Signed)
RECEIVED A FAX FROM [facility] CONCERNING A CONFIRMATION OF PRESCRIPTION SHIPMENT FOR TARCEVA ON 12/31/12.

## 2013-01-04 ENCOUNTER — Other Ambulatory Visit (HOSPITAL_BASED_OUTPATIENT_CLINIC_OR_DEPARTMENT_OTHER): Payer: Medicare Other | Admitting: Lab

## 2013-01-04 ENCOUNTER — Ambulatory Visit (HOSPITAL_COMMUNITY)
Admission: RE | Admit: 2013-01-04 | Discharge: 2013-01-04 | Disposition: A | Discharge status: Home / Self Care | Payer: Medicare Other | Source: Ambulatory Visit | Attending: Physician Assistant | Admitting: Physician Assistant

## 2013-01-04 DIAGNOSIS — C3410 Malignant neoplasm of upper lobe, unspecified bronchus or lung: Secondary | ICD-10-CM

## 2013-01-04 DIAGNOSIS — C50919 Malignant neoplasm of unspecified site of unspecified female breast: Secondary | ICD-10-CM | POA: Insufficient documentation

## 2013-01-04 DIAGNOSIS — K449 Diaphragmatic hernia without obstruction or gangrene: Secondary | ICD-10-CM | POA: Insufficient documentation

## 2013-01-04 DIAGNOSIS — K7689 Other specified diseases of liver: Secondary | ICD-10-CM | POA: Insufficient documentation

## 2013-01-04 DIAGNOSIS — I2510 Atherosclerotic heart disease of native coronary artery without angina pectoris: Secondary | ICD-10-CM | POA: Insufficient documentation

## 2013-01-04 LAB — COMPREHENSIVE METABOLIC PANEL (CC13)
AST: 19 U/L (ref 5–34)
BUN: 12.6 mg/dL (ref 7.0–26.0)
CO2: 28 mEq/L (ref 22–29)
Calcium: 9.4 mg/dL (ref 8.4–10.4)
Chloride: 107 mEq/L (ref 98–107)
Creatinine: 0.8 mg/dL (ref 0.6–1.1)
Total Bilirubin: 0.67 mg/dL (ref 0.20–1.20)

## 2013-01-04 LAB — CBC WITH DIFFERENTIAL/PLATELET
Basophils Absolute: 0.1 10*3/uL (ref 0.0–0.1)
EOS%: 2.7 % (ref 0.0–7.0)
HCT: 37.8 % (ref 34.8–46.6)
HGB: 12.4 g/dL (ref 11.6–15.9)
LYMPH%: 18.8 % (ref 14.0–49.7)
MCH: 29.4 pg (ref 25.1–34.0)
MCHC: 32.7 g/dL (ref 31.5–36.0)
MCV: 89.8 fL (ref 79.5–101.0)
NEUT%: 70.3 % (ref 38.4–76.8)
Platelets: 397 10*3/uL (ref 145–400)
lymph#: 1.6 10*3/uL (ref 0.9–3.3)

## 2013-01-07 ENCOUNTER — Encounter: Payer: Self-pay | Admitting: Internal Medicine

## 2013-01-07 ENCOUNTER — Telehealth: Payer: Self-pay | Admitting: Internal Medicine

## 2013-01-07 ENCOUNTER — Ambulatory Visit (HOSPITAL_BASED_OUTPATIENT_CLINIC_OR_DEPARTMENT_OTHER): Payer: Medicare Other | Admitting: Internal Medicine

## 2013-01-07 NOTE — Progress Notes (Signed)
[facility] Telephone:(336) [phone]   Fax:(336) [phone]  OFFICE PROGRESS NOTE  [name], MD [address] 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   Prior Therapy: None.   Current therapy: Tarceva at 150 mg by mouth daily status post 24 months therapy.  INTERVAL HISTORY: Kristin [name] 72 y.o. female returns to the clinic today for followup visit accompanied by her daughter-in-law. The patient is feeling fine today with no specific complaints. She denied having any significant weight loss or night sweats. She has no chest pain, shortness breath, cough or hemoptysis. She is tolerating her treatment with Tarceva fairly well except for very mild skin rash on the nose as well as 1 or 2 episodes of diarrhea every now and then. She had repeat CT scan of the chest performed recently and she is here for evaluation and discussion of her scan results.  MEDICAL HISTORY: Past Medical History  Diagnosis Date  . Hypertension   . Anemia   . Breast cancer 2002  . Lung cancer dx'd 12/2010    ALLERGIES:  is allergic to sulfa antibiotics.  MEDICATIONS:  Current Outpatient Prescriptions  Medication Sig Dispense Refill  . amLODipine (NORVASC) 5 MG tablet Take 5 mg by mouth daily.        . Bromfenac Sodium (PROLENSA) 0.07 % SOLN Apply 1 drop to eye at bedtime. In the right eye only      . Calcium Carb-Cholecalciferol (RA CALCIUM 600/VITAMIN D-3) 600-400 MG-UNIT TABS Take 2 tablets by mouth daily.        . clindamycin (CLEOCIN T) 1 % lotion Apply topically 2 (two) times daily.  60 mL  2  . cycloSPORINE (RESTASIS) 0.05 % ophthalmic emulsion Place 1 drop into both eyes 2 (two) times daily.      Marland Kitchen erlotinib (TARCEVA) 150 MG tablet Take 1 tablet (150 mg total) by mouth daily.  30 tablet  1  . escitalopram (LEXAPRO) 10 MG tablet Take 10 mg by mouth  daily.        . famotidine (PEPCID) 40 MG tablet Take 40 mg by mouth daily.        Marland Kitchen FeFum-FePoly-FA-B Cmp-C-Biot (FOLIVANE-PLUS) CAPS TAKE 1 CAPSULE BY MOUTH DAILY  90 capsule  0  . ibandronate (BONIVA) 150 MG tablet Take 150 mg by mouth every 30 (thirty) days. Take in the morning with a full glass of water, on an empty stomach, and do not take anything else by mouth or lie down for the next 30 min.       Marland Kitchen letrozole (FEMARA) 2.5 MG tablet Take 2.5 mg by mouth daily.        Bertram Gala Glycol-Propyl Glycol (SYSTANE) 0.4-0.3 % SOLN Apply 1 drop to eye 2 (two) times daily.      Marland Kitchen zolpidem (AMBIEN) 5 MG tablet at bedtime as needed.        No current facility-administered medications for this visit.    SURGICAL HISTORY:  Past Surgical History  Procedure Laterality Date  . Appendectomy    . Mastectomy      bilateral  . Lung surgery    . Cataracts      bilateral  . Colonoscopy      REVIEW OF SYSTEMS:  A comprehensive review of systems was negative.   PHYSICAL EXAMINATION: General appearance: alert, cooperative and no distress Head: Normocephalic, without obvious abnormality,  atraumatic Neck: no adenopathy Lymph nodes: Cervical, supraclavicular, and axillary nodes normal. Resp: clear to auscultation bilaterally Cardio: regular rate and rhythm, S1, S2 normal, no murmur, click, rub or gallop GI: soft, non-tender; bowel sounds normal; no masses,  no organomegaly Extremities: extremities normal, atraumatic, no cyanosis or edema Neurologic: Alert and oriented X 3, normal strength and tone. Normal symmetric reflexes. Normal coordination and gait  ECOG PERFORMANCE STATUS: 0 - Asymptomatic  Blood pressure 176/61, pulse 64, temperature 97.8 F (36.6 C), temperature source Oral, resp. rate 18, height 5\' 7"  (1.702 m), weight 145 lb 9.6 oz (66.044 kg).  LABORATORY DATA: Lab Results  Component Value Date   WBC 8.4 01/04/2013   HGB 12.4 01/04/2013   HCT 37.8 01/04/2013   MCV 89.8 01/04/2013    PLT 397 01/04/2013      Chemistry      Component Value Date/Time   NA 142 01/04/2013 1324   NA 141 05/28/2012 1006   NA 147* 03/26/2012 1022   K 4.0 01/04/2013 1324   K 4.3 05/28/2012 1006   K 4.8* 03/26/2012 1022   CL 107 01/04/2013 1324   CL 108 05/28/2012 1006   CL 102 03/26/2012 1022   CO2 28 01/04/2013 1324   CO2 26 05/28/2012 1006   CO2 31 03/26/2012 1022   BUN 12.6 01/04/2013 1324   BUN 12 05/28/2012 1006   BUN 14 03/26/2012 1022   CREATININE 0.8 01/04/2013 1324   CREATININE 0.90 05/28/2012 1006   CREATININE 0.7 03/26/2012 1022      Component Value Date/Time   CALCIUM 9.4 01/04/2013 1324   CALCIUM 9.1 05/28/2012 1006   CALCIUM 9.1 03/26/2012 1022   ALKPHOS 72 01/04/2013 1324   ALKPHOS 77 05/28/2012 1006   ALKPHOS 84 03/26/2012 1022   AST 19 01/04/2013 1324   AST 21 05/28/2012 1006   AST 26 03/26/2012 1022   ALT 17 01/04/2013 1324   ALT 17 05/28/2012 1006   BILITOT 0.67 01/04/2013 1324   BILITOT 0.8 05/28/2012 1006   BILITOT 0.70 03/26/2012 1022       RADIOGRAPHIC STUDIES: Ct Chest Wo Contrast  01/04/2013  *RADIOLOGY REPORT*  Clinical Data: Cancer status post resection.  Breast cancer status post bilateral mastectomy.  Restaging scan.  CT CHEST WITHOUT CONTRAST  Technique:  Multidetector CT imaging of the chest was performed following the standard protocol without IV contrast.  Comparison: Chest CT 09/18/2012.  Findings:  Mediastinum: Heart size is normal. There is no significant pericardial fluid, thickening or pericardial calcification. There is atherosclerosis of the thoracic aorta, the great vessels of the mediastinum and the coronary arteries, including calcified atherosclerotic plaque in the left main, left anterior descending and left circumflex coronary arteries. No pathologically enlarged mediastinal, internal mammary or hilar lymph nodes. Please note that accurate exclusion of hilar adenopathy is limited on noncontrast CT scans.  Large hiatal hernia.  Lungs/Pleura: Postoperative changes of wedge resection  are noted in the left upper lobe.  No findings to suggest local recurrence along the suture line.  There is some chronic subpleural reticulation in the periphery of the left lower lobe which is unchanged.  No suspicious appearing pulmonary nodules or masses are identified. No confluent consolidative airspace disease.  No pleural effusions. Mild scarring in the inferior aspect of the lingula and the medial segment of the right middle lobe is unchanged.  Upper Abdomen: 2.4 cm well defined low attenuation lesion in segment 8 of the liver is unchanged and previously characterized as a simple  cyst.  Musculoskeletal: There are no aggressive appearing lytic or blastic lesions noted in the visualized portions of the skeleton. Postoperative changes of bilateral modified radical mastectomy and right axillary lymph node dissection are noted.  There appears to be postoperative changes of TRAM flap reconstruction bilaterally.  IMPRESSION: 1.  Status post left upper lobe wedge resection without evidence to suggest local recurrence of disease or metastatic disease in the thorax. 2. Atherosclerosis, including left main and two-vessel coronary artery disease. Assessment for potential risk factor modification, dietary therapy or pharmacologic therapy may be warranted, if clinically indicated. 3.  Large hiatal hernia. 4.  Additional incidental findings, similar to prior examinations, as above.   Original Report Authenticated By: Trudie Reed, M.D.     ASSESSMENT: This is a very pleasant 72 years old white female with metastatic non-small cell lung cancer, adenocarcinoma with positive EGFR mutation currently on treatment with Tarceva for the last 2 years with no evidence for disease progression.   PLAN: I discussed the scan results with the patient today. I recommended for her to continue treatment with Tarceva with the current dose. She would come back for follow up visit in one month with repeat CBC and comprehensive  metabolic panel She was advised to call me immediately if she has any concerning symptoms in the interval.  All questions were answered. The patient knows to call the clinic with any problems, questions or concerns. We can certainly see the patient much sooner if necessary.  I spent 15 minutes counseling the patient face to face. The total time spent in the appointment was 25 minutes.

## 2013-01-07 NOTE — Telephone Encounter (Signed)
gv and printed appt schedule for pt for April °

## 2013-01-09 NOTE — Patient Instructions (Signed)
No evidence for disease progression on his recent scan.  Followup visit in one month. 

## 2013-02-04 ENCOUNTER — Other Ambulatory Visit (HOSPITAL_BASED_OUTPATIENT_CLINIC_OR_DEPARTMENT_OTHER): Payer: Medicare Other | Admitting: Lab

## 2013-02-04 ENCOUNTER — Ambulatory Visit (HOSPITAL_BASED_OUTPATIENT_CLINIC_OR_DEPARTMENT_OTHER): Payer: Medicare Other | Admitting: Internal Medicine

## 2013-02-04 ENCOUNTER — Telehealth: Payer: Self-pay | Admitting: Internal Medicine

## 2013-02-04 ENCOUNTER — Encounter: Payer: Self-pay | Admitting: Internal Medicine

## 2013-02-04 DIAGNOSIS — C3410 Malignant neoplasm of upper lobe, unspecified bronchus or lung: Secondary | ICD-10-CM

## 2013-02-04 LAB — COMPREHENSIVE METABOLIC PANEL (CC13)
ALT: 13 U/L (ref 0–55)
AST: 19 U/L (ref 5–34)
Albumin: 3 g/dL — ABNORMAL LOW (ref 3.5–5.0)
Alkaline Phosphatase: 79 U/L (ref 40–150)
Calcium: 9.4 mg/dL (ref 8.4–10.4)
Chloride: 106 mEq/L (ref 98–107)
Potassium: 4.2 mEq/L (ref 3.5–5.1)
Sodium: 141 mEq/L (ref 136–145)
Total Protein: 6.6 g/dL (ref 6.4–8.3)

## 2013-02-04 LAB — CBC WITH DIFFERENTIAL/PLATELET
Basophils Absolute: 0.1 10*3/uL (ref 0.0–0.1)
HCT: 34.1 % — ABNORMAL LOW (ref 34.8–46.6)
HGB: 11.4 g/dL — ABNORMAL LOW (ref 11.6–15.9)
MONO#: 0.9 10*3/uL (ref 0.1–0.9)
NEUT%: 66.5 % (ref 38.4–76.8)
Platelets: 438 10*3/uL — ABNORMAL HIGH (ref 145–400)
WBC: 7.2 10*3/uL (ref 3.9–10.3)
lymph#: 1.1 10*3/uL (ref 0.9–3.3)

## 2013-02-04 NOTE — Progress Notes (Signed)
[facility] Telephone:(336) [phone]   Fax:(336) [phone]  OFFICE PROGRESS NOTE  [name], MD [address] 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   Prior Therapy: None.   Current therapy: Tarceva at 150 mg by mouth daily status post 25 months therapy.  INTERVAL HISTORY: Kristin [name] 72 y.o. female returns to the clinic today for routine monthly followup visit. The patient is feeling fine today with no specific complaints. She denied having any significant chest pain, shortness breath, cough or hemoptysis. She denied having any weight loss or night sweats. She has no fatigue or weakness. She continues to have grade 1 skin rash in addition to one or 2 episodes of diarrhea daily, but in general she is tolerating her treatment fairly well. She has repeat CBC and comprehensive metabolic panel performed earlier today and she is here for evaluation and discussion of her lab results.  MEDICAL HISTORY: Past Medical History  Diagnosis Date  . Hypertension   . Anemia   . Breast cancer 2002  . Lung cancer dx'd 12/2010    ALLERGIES:  is allergic to sulfa antibiotics.  MEDICATIONS:  Current Outpatient Prescriptions  Medication Sig Dispense Refill  . amLODipine (NORVASC) 5 MG tablet Take 5 mg by mouth daily.        . brimonidine-timolol (COMBIGAN) 0.2-0.5 % ophthalmic solution Place 1 drop into the right eye every 12 (twelve) hours.      . Bromfenac Sodium (PROLENSA) 0.07 % SOLN Apply 1 drop to eye at bedtime. In the right eye only      . Calcium Carb-Cholecalciferol (RA CALCIUM 600/VITAMIN D-3) 600-400 MG-UNIT TABS Take 2 tablets by mouth daily.        . clindamycin (CLEOCIN T) 1 % lotion Apply topically 2 (two) times daily.  60 mL  2  . cycloSPORINE (RESTASIS) 0.05 % ophthalmic emulsion Place 1 drop into both eyes 2 (two) times  daily.      Marland Kitchen erlotinib (TARCEVA) 150 MG tablet Take 1 tablet (150 mg total) by mouth daily.  30 tablet  1  . escitalopram (LEXAPRO) 10 MG tablet Take 10 mg by mouth daily.        . famotidine (PEPCID) 40 MG tablet Take 40 mg by mouth daily.        Marland Kitchen FeFum-FePoly-FA-B Cmp-C-Biot (FOLIVANE-PLUS) CAPS TAKE 1 CAPSULE BY MOUTH DAILY  90 capsule  0  . letrozole (FEMARA) 2.5 MG tablet Take 2.5 mg by mouth daily.        . Loteprednol Etabonate (LOTEMAX) 0.5 % GEL Place 1 drop into the right eye 3 (three) times daily.      Bertram Gala Glycol-Propyl Glycol (SYSTANE) 0.4-0.3 % SOLN Apply 1 drop to eye 2 (two) times daily.      Marland Kitchen zolpidem (AMBIEN) 5 MG tablet at bedtime as needed.        No current facility-administered medications for this visit.    SURGICAL HISTORY:  Past Surgical History  Procedure Laterality Date  . Appendectomy    . Mastectomy      bilateral  . Lung surgery    . Cataracts      bilateral  . Colonoscopy      REVIEW OF SYSTEMS:  A comprehensive review of systems was negative.   PHYSICAL EXAMINATION: General appearance: alert, cooperative and no distress Head: Normocephalic, without obvious abnormality, atraumatic Neck:  no adenopathy Lymph nodes: Cervical, supraclavicular, and axillary nodes normal. Resp: clear to auscultation bilaterally Cardio: regular rate and rhythm, S1, S2 normal, no murmur, click, rub or gallop GI: soft, non-tender; bowel sounds normal; no masses,  no organomegaly Extremities: extremities normal, atraumatic, no cyanosis or edema  ECOG PERFORMANCE STATUS: 1 - Symptomatic but completely ambulatory  Blood pressure 158/68, pulse 54, temperature 97 F (36.1 C), temperature source Oral, resp. rate 18, height 5\' 7"  (1.702 m), weight 149 lb (67.586 kg).  LABORATORY DATA: Lab Results  Component Value Date   WBC 7.2 02/04/2013   HGB 11.4* 02/04/2013   HCT 34.1* 02/04/2013   MCV 89.7 02/04/2013   PLT 438* 02/04/2013      Chemistry      Component  Value Date/Time   NA 141 02/04/2013 0933   NA 141 05/28/2012 1006   NA 147* 03/26/2012 1022   K 4.2 02/04/2013 0933   K 4.3 05/28/2012 1006   K 4.8* 03/26/2012 1022   CL 106 02/04/2013 0933   CL 108 05/28/2012 1006   CL 102 03/26/2012 1022   CO2 28 02/04/2013 0933   CO2 26 05/28/2012 1006   CO2 31 03/26/2012 1022   BUN 14.1 02/04/2013 0933   BUN 12 05/28/2012 1006   BUN 14 03/26/2012 1022   CREATININE 0.8 02/04/2013 0933   CREATININE 0.90 05/28/2012 1006   CREATININE 0.7 03/26/2012 1022      Component Value Date/Time   CALCIUM 9.4 02/04/2013 0933   CALCIUM 9.1 05/28/2012 1006   CALCIUM 9.1 03/26/2012 1022   ALKPHOS 79 02/04/2013 0933   ALKPHOS 77 05/28/2012 1006   ALKPHOS 84 03/26/2012 1022   AST 19 02/04/2013 0933   AST 21 05/28/2012 1006   AST 26 03/26/2012 1022   ALT 13 02/04/2013 0933   ALT 17 05/28/2012 1006   BILITOT 0.70 02/04/2013 0933   BILITOT 0.8 05/28/2012 1006   BILITOT 0.70 03/26/2012 1022       RADIOGRAPHIC STUDIES: No results found.  ASSESSMENT: This is a very pleasant 72 years old white female with metastatic non-small cell lung cancer with positive EGFR mutation currently on treatment with Tarceva 150 mg by mouth daily for the last 25 months. The patient is tolerating her treatment fairly well with no significant adverse effects.   PLAN: I recommended for her to continue treatment with Tarceva with the current dose. For the anemia, I advised the patient to continue treatment was Integra plus. She would come back for followup visit in one month's for reevaluation and repeat blood work. She was advised to call immediately if she has any concerning symptoms in the interval.  All questions were answered. The patient knows to call the clinic with any problems, questions or concerns. We can certainly see the patient much sooner if necessary.

## 2013-02-04 NOTE — Telephone Encounter (Signed)
GV AND PRINTED APPT SCHEDULE AND AVS FOR PT.    °

## 2013-02-04 NOTE — Patient Instructions (Signed)
Your labwork is good today except for mild anemia. Continue treatment with Tarceva and Integra plus. Followup in 1 month

## 2013-02-22 ENCOUNTER — Encounter (INDEPENDENT_AMBULATORY_CARE_PROVIDER_SITE_OTHER): Payer: Medicare Other | Admitting: Ophthalmology

## 2013-02-22 DIAGNOSIS — H43819 Vitreous degeneration, unspecified eye: Secondary | ICD-10-CM

## 2013-02-22 DIAGNOSIS — H35379 Puckering of macula, unspecified eye: Secondary | ICD-10-CM

## 2013-02-22 DIAGNOSIS — I10 Essential (primary) hypertension: Secondary | ICD-10-CM

## 2013-02-22 DIAGNOSIS — H35039 Hypertensive retinopathy, unspecified eye: Secondary | ICD-10-CM

## 2013-02-22 DIAGNOSIS — D3130 Benign neoplasm of unspecified choroid: Secondary | ICD-10-CM

## 2013-02-22 DIAGNOSIS — H35359 Cystoid macular degeneration, unspecified eye: Secondary | ICD-10-CM

## 2013-02-25 ENCOUNTER — Telehealth: Payer: Self-pay | Admitting: Medical Oncology

## 2013-02-25 NOTE — Telephone Encounter (Signed)
received refill request . Sent to Dr Arbutus Ped for authorization.

## 2013-02-26 ENCOUNTER — Other Ambulatory Visit: Payer: Self-pay | Admitting: Medical Oncology

## 2013-02-26 MED ORDER — ERLOTINIB HCL 150 MG PO TABS: 150.0000 mg | ORAL_TABLET | Freq: Every day | ORAL | Status: DC

## 2013-02-26 NOTE — Addendum Note (Signed)
Addended by: Charma Igo on: 02/26/2013 11:22 AM   Modules accepted: Orders

## 2013-02-26 NOTE — Telephone Encounter (Signed)
Faxed refill to [facility]

## 2013-02-26 NOTE — Telephone Encounter (Signed)
Refill faxed to [facility] with confirmation of receipt.

## 2013-02-26 NOTE — Telephone Encounter (Signed)
refaxed rx with quantity of #30

## 2013-03-02 ENCOUNTER — Encounter: Payer: Self-pay | Admitting: Internal Medicine

## 2013-03-02 NOTE — Progress Notes (Signed)
Per Leane Platt at [facility] patient had assistance with Tarceva.  02/28/13-02/27/14. I will send to medical records.

## 2013-03-03 ENCOUNTER — Other Ambulatory Visit: Payer: Self-pay | Admitting: *Deleted

## 2013-03-03 NOTE — Telephone Encounter (Signed)
Rec'd notice that patient has received copay assistance with Tarceva through Patient [employer]. $0 copay until $7500 in funds have been depleted. [facility] has process Rx and will be shipped for delivery on 03/03/13.

## 2013-03-04 ENCOUNTER — Other Ambulatory Visit (HOSPITAL_BASED_OUTPATIENT_CLINIC_OR_DEPARTMENT_OTHER): Payer: Medicare Other | Admitting: Lab

## 2013-03-04 ENCOUNTER — Other Ambulatory Visit: Payer: Self-pay | Admitting: Medical Oncology

## 2013-03-04 ENCOUNTER — Telehealth: Payer: Self-pay | Admitting: Internal Medicine

## 2013-03-04 ENCOUNTER — Ambulatory Visit (HOSPITAL_BASED_OUTPATIENT_CLINIC_OR_DEPARTMENT_OTHER): Payer: Medicare Other | Admitting: Internal Medicine

## 2013-03-04 ENCOUNTER — Encounter: Payer: Self-pay | Admitting: Internal Medicine

## 2013-03-04 DIAGNOSIS — C3410 Malignant neoplasm of upper lobe, unspecified bronchus or lung: Secondary | ICD-10-CM

## 2013-03-04 LAB — CBC WITH DIFFERENTIAL/PLATELET
BASO%: 1.3 % (ref 0.0–2.0)
Basophils Absolute: 0.1 10*3/uL (ref 0.0–0.1)
EOS%: 5.3 % (ref 0.0–7.0)
MCH: 29.8 pg (ref 25.1–34.0)
MCHC: 33.5 g/dL (ref 31.5–36.0)
MCV: 89.1 fL (ref 79.5–101.0)
MONO%: 12.4 % (ref 0.0–14.0)
RBC: 3.9 10*6/uL (ref 3.70–5.45)
RDW: 13.5 % (ref 11.2–14.5)

## 2013-03-04 LAB — COMPREHENSIVE METABOLIC PANEL (CC13)
ALT: 16 U/L (ref 0–55)
AST: 21 U/L (ref 5–34)
Albumin: 3 g/dL — ABNORMAL LOW (ref 3.5–5.0)
Alkaline Phosphatase: 68 U/L (ref 40–150)
BUN: 9.9 mg/dL (ref 7.0–26.0)
Potassium: 4.4 mEq/L (ref 3.5–5.1)
Sodium: 140 mEq/L (ref 136–145)

## 2013-03-04 MED ORDER — LETROZOLE 2.5 MG PO TABS: 2.5000 mg | ORAL_TABLET | Freq: Every day | ORAL | Status: DC

## 2013-03-04 NOTE — Patient Instructions (Signed)
Continue treatment with Tarceva and Femara. Followup visit in one month with repeat CT scan of the chest.

## 2013-03-04 NOTE — Progress Notes (Signed)
[facility] Telephone:(336) [phone]   Fax:(336) [phone]  OFFICE PROGRESS NOTE  [name], MD [address] [place] [address]  Principle Diagnosis:  1) Metastatic non-small cell lung cancer adenocarcinoma with positive for EGFR mutation at exon 21 diagnosed in March of 2012.  2) history of breast cancer.  Prior Therapy: None.   Current therapy:  1) Tarceva at 150 mg by mouth daily status post 26 months therapy. 2) Femara 2.5 mg by mouth daily.  INTERVAL HISTORY: Kristin [name] 72 y.o. female returns to the clinic today for routine monthly followup visit. The patient is feeling fine today with no specific complaints except for scalp infection and she is currently on treatment with clindamycin 300 mg by mouth 3 times a day. She denied having any significant weight loss or night sweats. She denied having any chest pain, shortness of breath, cough or hemoptysis. She is tolerating her treatment was Tarceva fairly well with no significant diarrhea.  MEDICAL HISTORY: Past Medical History  Diagnosis Date  . Hypertension   . Anemia   . Breast cancer 2002  . Lung cancer dx'd 12/2010    ALLERGIES:  is allergic to sulfa antibiotics.  MEDICATIONS:  Current Outpatient Prescriptions  Medication Sig Dispense Refill  . amLODipine (NORVASC) 5 MG tablet Take 5 mg by mouth daily.        . brimonidine-timolol (COMBIGAN) 0.2-0.5 % ophthalmic solution Place 1 drop into the right eye every 12 (twelve) hours.      . Bromfenac Sodium (PROLENSA) 0.07 % SOLN Apply 1 drop to eye at bedtime. In the right eye only      . Calcium Carb-Cholecalciferol (RA CALCIUM 600/VITAMIN D-3) 600-400 MG-UNIT TABS Take 2 tablets by mouth daily.        . clindamycin (CLEOCIN T) 1 % lotion Apply topically 2 (two) times daily.  60 mL  2  . clindamycin (CLEOCIN) 300 MG capsule Take 1 capsule by mouth 3 (three) times daily.      . cycloSPORINE (RESTASIS) 0.05 % ophthalmic emulsion Place 1  drop into both eyes 2 (two) times daily.      Marland Kitchen erlotinib (TARCEVA) 150 MG tablet Take 1 tablet (150 mg total) by mouth daily.  30 tablet  2  . escitalopram (LEXAPRO) 10 MG tablet Take 10 mg by mouth daily.        . famotidine (PEPCID) 40 MG tablet Take 40 mg by mouth daily.        Marland Kitchen FeFum-FePoly-FA-B Cmp-C-Biot (FOLIVANE-PLUS) CAPS TAKE 1 CAPSULE BY MOUTH DAILY  90 capsule  0  . letrozole (FEMARA) 2.5 MG tablet Take 2.5 mg by mouth daily.        Bertram Gala Glycol-Propyl Glycol (SYSTANE) 0.4-0.3 % SOLN Apply 1 drop to eye 2 (two) times daily.      Marland Kitchen zolpidem (AMBIEN) 5 MG tablet at bedtime as needed.        No current facility-administered medications for this visit.    SURGICAL HISTORY:  Past Surgical History  Procedure Laterality Date  . Appendectomy    . Mastectomy      bilateral  . Lung surgery    . Cataracts      bilateral  . Colonoscopy      REVIEW OF SYSTEMS:  A comprehensive review of systems was negative except for: infection on the scalp   PHYSICAL EXAMINATION: General appearance: alert, cooperative and no distress Head: Normocephalic, without obvious abnormality, atraumatic, erythematous area at the scalp  of the top of the head Neck: no adenopathy Lymph nodes: Cervical, supraclavicular, and axillary nodes normal. Resp: clear to auscultation bilaterally Cardio: regular rate and rhythm, S1, S2 normal, no murmur, click, rub or gallop GI: soft, non-tender; bowel sounds normal; no masses,  no organomegaly Extremities: extremities normal, atraumatic, no cyanosis or edema  ECOG PERFORMANCE STATUS: 1 - Symptomatic but completely ambulatory  Blood pressure 139/73, pulse 56, temperature 97.2 F (36.2 C), temperature source Oral, resp. rate 17, height 5\' 7"  (1.702 m), weight 147 lb 9.6 oz (66.951 kg).  LABORATORY DATA: Lab Results  Component Value Date   WBC 5.8 03/04/2013   HGB 11.6 03/04/2013   HCT 34.7* 03/04/2013   MCV 89.1 03/04/2013   PLT 367 03/04/2013       Chemistry      Component Value Date/Time   NA 141 02/04/2013 0933   NA 141 05/28/2012 1006   NA 147* 03/26/2012 1022   K 4.2 02/04/2013 0933   K 4.3 05/28/2012 1006   K 4.8* 03/26/2012 1022   CL 106 02/04/2013 0933   CL 108 05/28/2012 1006   CL 102 03/26/2012 1022   CO2 28 02/04/2013 0933   CO2 26 05/28/2012 1006   CO2 31 03/26/2012 1022   BUN 14.1 02/04/2013 0933   BUN 12 05/28/2012 1006   BUN 14 03/26/2012 1022   CREATININE 0.8 02/04/2013 0933   CREATININE 0.90 05/28/2012 1006   CREATININE 0.7 03/26/2012 1022      Component Value Date/Time   CALCIUM 9.4 02/04/2013 0933   CALCIUM 9.1 05/28/2012 1006   CALCIUM 9.1 03/26/2012 1022   ALKPHOS 79 02/04/2013 0933   ALKPHOS 77 05/28/2012 1006   ALKPHOS 84 03/26/2012 1022   AST 19 02/04/2013 0933   AST 21 05/28/2012 1006   AST 26 03/26/2012 1022   ALT 13 02/04/2013 0933   ALT 17 05/28/2012 1006   BILITOT 0.70 02/04/2013 0933   BILITOT 0.8 05/28/2012 1006   BILITOT 0.70 03/26/2012 1022       RADIOGRAPHIC STUDIES: No results found.  ASSESSMENT: This is a very pleasant 72 years old white female with metastatic non-small cell lung cancer, adenocarcinoma with positive EGFR mutation and has been on treatment with oral Tarceva 150 mg by mouth daily for the last 26 months. The patient is doing fine with no specific complaints of adverse effect from the chemotherapy.   PLAN: I discussed the lab result with the patient today. I recommended for her to come back for followup visit in one month with repeat CT scan of the chest. She was advised to call immediately if she has any concerning symptoms in the interval. She was given a refill of Femara 2.5 mg by mouth daily.  All questions were answered. The patient knows to call the clinic with any problems, questions or concerns. We can certainly see the patient much sooner if necessary.

## 2013-03-04 NOTE — Telephone Encounter (Signed)
gv pt appt schedule for June. [facility] re appt for ct. Per pt due to commute per [facility] she can have lb/ct and f/u same day. Pt will s/w [facility] re ct same day as f/u when they contact her. I have also added a note to ct order to schedule ct same day as f/u.

## 2013-03-26 ENCOUNTER — Encounter: Payer: Self-pay | Admitting: *Deleted

## 2013-03-26 NOTE — Progress Notes (Signed)
RECEIVED A FAX FROM [facility] CONCERNING A CONFIRMATION OF PRESCRIPTION SHIPMENT FOR TARCEVA ON 03/30/13.

## 2013-04-05 ENCOUNTER — Encounter: Payer: Self-pay | Admitting: Internal Medicine

## 2013-04-05 ENCOUNTER — Ambulatory Visit (HOSPITAL_BASED_OUTPATIENT_CLINIC_OR_DEPARTMENT_OTHER): Payer: Medicare Other | Admitting: Internal Medicine

## 2013-04-05 ENCOUNTER — Other Ambulatory Visit (HOSPITAL_BASED_OUTPATIENT_CLINIC_OR_DEPARTMENT_OTHER): Payer: Medicare Other | Admitting: Lab

## 2013-04-05 ENCOUNTER — Telehealth: Payer: Self-pay | Admitting: Internal Medicine

## 2013-04-05 ENCOUNTER — Other Ambulatory Visit: Payer: Self-pay | Admitting: Internal Medicine

## 2013-04-05 ENCOUNTER — Ambulatory Visit (HOSPITAL_COMMUNITY)
Admission: RE | Admit: 2013-04-05 | Discharge: 2013-04-05 | Disposition: A | Discharge status: Home / Self Care | Payer: Medicare Other | Source: Ambulatory Visit | Attending: Internal Medicine | Admitting: Internal Medicine

## 2013-04-05 DIAGNOSIS — C3410 Malignant neoplasm of upper lobe, unspecified bronchus or lung: Secondary | ICD-10-CM

## 2013-04-05 DIAGNOSIS — K449 Diaphragmatic hernia without obstruction or gangrene: Secondary | ICD-10-CM | POA: Insufficient documentation

## 2013-04-05 DIAGNOSIS — C3490 Malignant neoplasm of unspecified part of unspecified bronchus or lung: Secondary | ICD-10-CM | POA: Insufficient documentation

## 2013-04-05 LAB — CBC WITH DIFFERENTIAL/PLATELET
BASO%: 1.5 % (ref 0.0–2.0)
EOS%: 5.7 % (ref 0.0–7.0)
HCT: 33.6 % — ABNORMAL LOW (ref 34.8–46.6)
HGB: 11.4 g/dL — ABNORMAL LOW (ref 11.6–15.9)
MCH: 30 pg (ref 25.1–34.0)
MCHC: 33.9 g/dL (ref 31.5–36.0)
MONO#: 0.7 10*3/uL (ref 0.1–0.9)
NEUT%: 53.9 % (ref 38.4–76.8)
RDW: 14.2 % (ref 11.2–14.5)
WBC: 5.2 10*3/uL (ref 3.9–10.3)
lymph#: 1.3 10*3/uL (ref 0.9–3.3)

## 2013-04-05 LAB — COMPREHENSIVE METABOLIC PANEL (CC13)
ALT: 16 U/L (ref 0–55)
AST: 20 U/L (ref 5–34)
Albumin: 3 g/dL — ABNORMAL LOW (ref 3.5–5.0)
CO2: 28 mEq/L (ref 22–29)
Calcium: 9 mg/dL (ref 8.4–10.4)
Chloride: 107 mEq/L (ref 98–107)
Creatinine: 0.8 mg/dL (ref 0.6–1.1)
Potassium: 4.2 mEq/L (ref 3.5–5.1)
Sodium: 141 mEq/L (ref 136–145)
Total Protein: 5.9 g/dL — ABNORMAL LOW (ref 6.4–8.3)

## 2013-04-05 NOTE — Telephone Encounter (Signed)
gv and printed appt sched and avs of rpt. °

## 2013-04-05 NOTE — Patient Instructions (Addendum)
The scan showed no evidence for disease progression.  Continue treatment with Tarceva. Followup visit in one month

## 2013-04-05 NOTE — Progress Notes (Signed)
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  2) history of breast cancer.   Prior Therapy: None.   Current therapy:  1) Tarceva at 150 mg by mouth daily status post 27 months therapy.  2) Femara 2.5 mg by mouth daily.   INTERVAL HISTORY: Kristin [name] 72 y.o. female returns to the clinic today for followup visit accompanied by her daughter-in-law. The patient is doing fine today with no specific complaints. She denied having any significant weight loss or night sweats. She has no chest pain, shortness breath, cough or hemoptysis. She is tolerating her treatment with Tarceva fairly well with no significant adverse effects. The patient is also on Femara for her history of breast cancer and she has no significant adverse effect from his treatment. She had repeat CT scan of the chest performed recently and she is here for evaluation and discussion of her scan results.  MEDICAL HISTORY: Past Medical History  Diagnosis Date  . Hypertension   . Anemia   . Breast cancer 2002  . Lung cancer dx'd 12/2010    ALLERGIES:  is allergic to sulfa antibiotics.  MEDICATIONS:  Current Outpatient Prescriptions  Medication Sig Dispense Refill  . amLODipine (NORVASC) 5 MG tablet Take 5 mg by mouth daily.        . brimonidine-timolol (COMBIGAN) 0.2-0.5 % ophthalmic solution Place 1 drop into the right eye every 12 (twelve) hours.      . Bromfenac Sodium (PROLENSA) 0.07 % SOLN Apply 1 drop to eye at bedtime. In the right eye only      . Calcium Carb-Cholecalciferol (RA CALCIUM 600/VITAMIN D-3) 600-400 MG-UNIT TABS Take 2 tablets by mouth daily.        . clindamycin (CLEOCIN T) 1 % lotion Apply topically 2 (two) times daily.  60 mL  2  .  clindamycin (CLEOCIN) 300 MG capsule Take 1 capsule by mouth 3 (three) times daily.      . cycloSPORINE (RESTASIS) 0.05 % ophthalmic emulsion Place 1 drop into both eyes 2 (two) times daily.      Marland Kitchen erlotinib (TARCEVA) 150 MG tablet Take 1 tablet (150 mg total) by mouth daily.  30 tablet  2  . escitalopram (LEXAPRO) 10 MG tablet Take 10 mg by mouth daily.        . famotidine (PEPCID) 40 MG tablet Take 40 mg by mouth daily.        Marland Kitchen FeFum-FePoly-FA-B Cmp-C-Biot (FOLIVANE-PLUS) CAPS TAKE 1 CAPSULE BY MOUTH DAILY  90 capsule  0  . letrozole (FEMARA) 2.5 MG tablet Take 1 tablet (2.5 mg total) by mouth daily.  90 tablet  2  . Polyethyl Glycol-Propyl Glycol (SYSTANE) 0.4-0.3 % SOLN Apply 1 drop to eye 2 (two) times daily.      Marland Kitchen zolpidem (AMBIEN) 5 MG tablet at bedtime as needed.        No current facility-administered medications for this visit.    SURGICAL HISTORY:  Past Surgical History  Procedure Laterality Date  . Appendectomy    . Mastectomy      bilateral  . Lung surgery    . Cataracts      bilateral  . Colonoscopy      REVIEW OF SYSTEMS:  A comprehensive review of systems  was negative.   PHYSICAL EXAMINATION: General appearance: alert, cooperative and no distress Head: Normocephalic, without obvious abnormality, atraumatic Neck: no adenopathy Lymph nodes: Cervical, supraclavicular, and axillary nodes normal. Resp: clear to auscultation bilaterally Cardio: regular rate and rhythm, S1, S2 normal, no murmur, click, rub or gallop GI: soft, non-tender; bowel sounds normal; no masses,  no organomegaly Extremities: extremities normal, atraumatic, no cyanosis or edema Neurologic: Alert and oriented X 3, normal strength and tone. Normal symmetric reflexes. Normal coordination and gait  ECOG PERFORMANCE STATUS: 0 - Asymptomatic  Blood pressure 161/56, pulse 63, temperature 97.8 F (36.6 C), temperature source Oral, resp. rate 18, height 5\' 7"  (1.702 m), weight 148 lb 8 oz (67.359  kg).  LABORATORY DATA: Lab Results  Component Value Date   WBC 5.2 04/05/2013   HGB 11.4* 04/05/2013   HCT 33.6* 04/05/2013   MCV 88.7 04/05/2013   PLT 324 04/05/2013      Chemistry      Component Value Date/Time   NA 141 04/05/2013 0754   NA 141 05/28/2012 1006   NA 147* 03/26/2012 1022   K 4.2 04/05/2013 0754   K 4.3 05/28/2012 1006   K 4.8* 03/26/2012 1022   CL 107 04/05/2013 0754   CL 108 05/28/2012 1006   CL 102 03/26/2012 1022   CO2 28 04/05/2013 0754   CO2 26 05/28/2012 1006   CO2 31 03/26/2012 1022   BUN 15.2 04/05/2013 0754   BUN 12 05/28/2012 1006   BUN 14 03/26/2012 1022   CREATININE 0.8 04/05/2013 0754   CREATININE 0.90 05/28/2012 1006   CREATININE 0.7 03/26/2012 1022      Component Value Date/Time   CALCIUM 9.0 04/05/2013 0754   CALCIUM 9.1 05/28/2012 1006   CALCIUM 9.1 03/26/2012 1022   ALKPHOS 68 04/05/2013 0754   ALKPHOS 77 05/28/2012 1006   ALKPHOS 84 03/26/2012 1022   AST 20 04/05/2013 0754   AST 21 05/28/2012 1006   AST 26 03/26/2012 1022   ALT 16 04/05/2013 0754   ALT 17 05/28/2012 1006   BILITOT 0.66 04/05/2013 0754   BILITOT 0.8 05/28/2012 1006   BILITOT 0.70 03/26/2012 1022       RADIOGRAPHIC STUDIES: Ct Chest Wo Contrast  04/05/2013   *RADIOLOGY REPORT*  Clinical Data: Follow-up lung carcinoma.  On oral chemotherapy.  CT CHEST WITHOUT CONTRAST  Technique:  Multidetector CT imaging of the chest was performed following the standard protocol without IV contrast.  Comparison: 01/04/2013  Findings: Postoperative scarring in the left upper lung field remains stable.  No suspicious pulmonary nodules or masses are identified.  Mild scarring at left lung base is also unchanged.  No evidence of acute infiltrate or central endobronchial lesion.  No evidence of pleural or pericardial effusion.  A moderate size hiatal hernia is again seen.  No other mediastinal or hilar masses are seen.  No evidence of lymphadenopathy in the thorax.  No evidence of chest wall mass or suspicious bone lesions. Cyst in the dome  of the right hepatic lobe remains stable.  IMPRESSION:  1.  No active disease or acute findings.  No evidence of recurrent or metastatic carcinoma within the thorax. 2.  Stable moderate hiatal hernia.   Original Report Authenticated By: Myles Rosenthal, M.D.    ASSESSMENT AND PLAN:  1) Metastatic non-small cell lung cancer currently on treatment with Tarceva 150 mg by mouth daily for the last 27 months and tolerating it fairly well with no evidence for disease progression. I discussed  the scan results with the patient and her daughter-in-law. I recommended for her to continue on treatment with Tarceva with the same dose. 2) history of breast cancer: She will continue treatment with Femara. 3) hypertension: She mentions that her blood pressure at home is within the normal range. She was advised to take her blood pressure medication at regular basis. 4) the patient would come back for followup visit in one month. She was advised to call immediately if she has any concerning symptoms in the interval.  All questions were answered. The patient knows to call the clinic with any problems, questions or concerns. We can certainly see the patient much sooner if necessary.  I spent 15 minutes counseling the patient face to face. The total time spent in the appointment was 25 minutes.

## 2013-04-08 ENCOUNTER — Other Ambulatory Visit: Payer: Self-pay | Admitting: Internal Medicine

## 2013-04-27 ENCOUNTER — Encounter: Payer: Self-pay | Admitting: *Deleted

## 2013-04-27 NOTE — Progress Notes (Signed)
RECEIVED A FAX FROM [facility] CONCERNING A CONFIRMATION OF PRESCRIPTION SHIPMENT FOR TARCEVA ON 04/27/13.

## 2013-04-29 ENCOUNTER — Emergency Department (HOSPITAL_COMMUNITY): Payer: Medicare Other

## 2013-04-29 ENCOUNTER — Encounter (HOSPITAL_COMMUNITY): Payer: Self-pay | Admitting: Emergency Medicine

## 2013-04-29 ENCOUNTER — Observation Stay (HOSPITAL_COMMUNITY)
Admission: EM | Admit: 2013-04-29 | Discharge: 2013-04-30 | Disposition: A | Discharge status: Home / Self Care | Payer: Medicare Other | Attending: Internal Medicine | Admitting: Internal Medicine

## 2013-04-29 DIAGNOSIS — K449 Diaphragmatic hernia without obstruction or gangrene: Secondary | ICD-10-CM

## 2013-04-29 DIAGNOSIS — K5730 Diverticulosis of large intestine without perforation or abscess without bleeding: Secondary | ICD-10-CM

## 2013-04-29 DIAGNOSIS — I10 Essential (primary) hypertension: Secondary | ICD-10-CM

## 2013-04-29 DIAGNOSIS — Z79899 Other long term (current) drug therapy: Secondary | ICD-10-CM | POA: Insufficient documentation

## 2013-04-29 DIAGNOSIS — R195 Other fecal abnormalities: Secondary | ICD-10-CM

## 2013-04-29 DIAGNOSIS — I6350 Cerebral infarction due to unspecified occlusion or stenosis of unspecified cerebral artery: Principal | ICD-10-CM | POA: Insufficient documentation

## 2013-04-29 DIAGNOSIS — I639 Cerebral infarction, unspecified: Secondary | ICD-10-CM

## 2013-04-29 DIAGNOSIS — D126 Benign neoplasm of colon, unspecified: Secondary | ICD-10-CM

## 2013-04-29 DIAGNOSIS — C3410 Malignant neoplasm of upper lobe, unspecified bronchus or lung: Secondary | ICD-10-CM | POA: Insufficient documentation

## 2013-04-29 DIAGNOSIS — K219 Gastro-esophageal reflux disease without esophagitis: Secondary | ICD-10-CM

## 2013-04-29 DIAGNOSIS — G459 Transient cerebral ischemic attack, unspecified: Secondary | ICD-10-CM

## 2013-04-29 DIAGNOSIS — Z853 Personal history of malignant neoplasm of breast: Secondary | ICD-10-CM

## 2013-04-29 DIAGNOSIS — Z800 Family history of malignant neoplasm of digestive organs: Secondary | ICD-10-CM

## 2013-04-29 DIAGNOSIS — C3412 Malignant neoplasm of upper lobe, left bronchus or lung: Secondary | ICD-10-CM | POA: Diagnosis present

## 2013-04-29 LAB — APTT: aPTT: 28 seconds (ref 24–37)

## 2013-04-29 LAB — URINALYSIS, ROUTINE W REFLEX MICROSCOPIC
Bilirubin Urine: NEGATIVE
Hgb urine dipstick: NEGATIVE
Nitrite: NEGATIVE
Specific Gravity, Urine: 1.02 (ref 1.005–1.030)
pH: 7.5 (ref 5.0–8.0)

## 2013-04-29 LAB — POCT I-STAT, CHEM 8
Calcium, Ion: 1.23 mmol/L (ref 1.13–1.30)
Chloride: 104 mEq/L (ref 96–112)
HCT: 38 % (ref 36.0–46.0)
Hemoglobin: 12.9 g/dL (ref 12.0–15.0)
TCO2: 26 mmol/L (ref 0–100)

## 2013-04-29 LAB — URINE MICROSCOPIC-ADD ON

## 2013-04-29 LAB — POCT I-STAT TROPONIN I: Troponin i, poc: 0.01 ng/mL (ref 0.00–0.08)

## 2013-04-29 LAB — PROTIME-INR
INR: 0.88 (ref 0.00–1.49)
Prothrombin Time: 11.8 seconds (ref 11.6–15.2)

## 2013-04-29 NOTE — ED Notes (Signed)
Patient returned from CT

## 2013-04-29 NOTE — ED Notes (Signed)
Patient given water per Dr Radford Pax request to take home cancer meds.

## 2013-04-29 NOTE — Consult Note (Signed)
Referring Physician: Dr. Radford Pax    Chief Complaint: Transient left-sided weakness and numbness.  HPI: Kristin [name] is an 72 y.o. female history of hypertension who experienced transient weakness and numbness involving the left side with the onset at about 7 PM tonight. Symptoms lasted 45-60 minutes and resolved completely with no recurrence. There is no history of stroke nor TIA. She has not been on antiplatelet therapy. CT scan of her head showed no acute intracranial abnormality. NIH stroke score was 0.  LSN: 7 PM on 04/29/2013 tPA Given: No: Deficits resolved MRankin: 0  Past Medical History  Diagnosis Date  . Hypertension   . Anemia   . Breast cancer 2002  . Lung cancer dx'd 12/2010    Family History  Problem Relation Age of Onset  . Colon cancer Sister   . Lung cancer Father   . Heart disease Mother      Medications: I have reviewed the patient's current medications.  ROS: History obtained from the patient  General ROS: negative for - chills, fatigue, fever, night sweats, weight gain or weight loss Psychological ROS: negative for - behavioral disorder, hallucinations, memory difficulties, mood swings or suicidal ideation Ophthalmic ROS: negative for - blurry vision, double vision, eye pain or loss of vision ENT ROS: negative for - epistaxis, nasal discharge, oral lesions, sore throat, tinnitus or vertigo Allergy and Immunology ROS: negative for - hives or itchy/watery eyes Hematological and Lymphatic ROS: negative for - bleeding problems, bruising or swollen lymph nodes Endocrine ROS: negative for - galactorrhea, hair pattern changes, polydipsia/polyuria or temperature intolerance Respiratory ROS: negative for - cough, hemoptysis, shortness of breath or wheezing Cardiovascular ROS: negative for - chest pain, dyspnea on exertion, edema or irregular heartbeat Gastrointestinal ROS: negative for - abdominal pain, diarrhea, hematemesis, nausea/vomiting or stool  incontinence Genito-Urinary ROS: negative for - dysuria, hematuria, incontinence or urinary frequency/urgency Musculoskeletal ROS: negative for - joint swelling or muscular weakness Neurological ROS: as noted in HPI Dermatological ROS: negative for rash and skin lesion changes  Physical Examination: Blood pressure 144/54, pulse 63, temperature 97.7 F (36.5 C), temperature source Oral, resp. rate 19, height 5\' 9"  (1.753 m), weight 65.772 kg (145 lb), SpO2 98.00%.  Neurologic Examination: Mental Status: Alert, oriented, thought content appropriate.  Speech fluent without evidence of aphasia. Able to follow commands without difficulty. Cranial Nerves: II-Visual fields were normal. III/IV/VI-Pupils were equal and reacted. Extraocular movements were full and conjugate.    V/VII-no facial numbness and no facial weakness. VIII-normal. X-normal speech and symmetrical palatal movement. Motor: 5/5 bilaterally with normal tone and bulk Sensory: Normal throughout. Deep Tendon Reflexes: 2+ and symmetric. Plantars: Flexor bilaterally Cerebellar: Normal finger-to-nose testing. Carotid auscultation: Normal  Ct Head Wo Contrast  04/29/2013   *RADIOLOGY REPORT*  Clinical Data: Weakness.  CT HEAD WITHOUT CONTRAST  Technique:  Contiguous axial images were obtained from the base of the skull through the vertex without contrast.  Comparison: 01/08/2011  Findings: No acute intracranial abnormality.  Specifically, no hemorrhage, hydrocephalus, mass lesion, acute infarction, or significant intracranial injury.  No acute calvarial abnormality. Visualized paranasal sinuses and mastoids clear.  Orbital soft tissues unremarkable.  IMPRESSION: No acute intracranial abnormality.   Original Report Authenticated By: Charlett Nose, M.D.    Assessment: 72 y.o. 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 Small vessel subcortical stroke cannot be ruled out, however.  Stroke  Risk Factors - hypertension  Plan: 1. HgbA1c, fasting lipid panel 2. MRI, MRA  of the brain  without contrast 3. PT consult, OT consult, Speech consult 4. Echocardiogram 5. Carotid dopplers 6. Prophylactic therapy-Antiplatelet med: Aspirin 81 mg per day 7. Risk factor modification 8. Telemetry monitoring   C.R. Roseanne Reno, MD [facility] [phone]  04/29/2013, 11:26 PM

## 2013-04-29 NOTE — ED Notes (Signed)
Patient transported to CT 

## 2013-04-29 NOTE — ED Notes (Signed)
Patient ambulated to restroom without assistance. Nurse accompanied.

## 2013-04-29 NOTE — ED Notes (Signed)
MD at bedside. 

## 2013-04-29 NOTE — ED Provider Notes (Signed)
History    CSN: [id number] Arrival date & time 04/29/13  2035  First MD Initiated Contact with Patient 04/29/13 2054     Chief Complaint  Patient presents with  . Numbness    left side   (Consider location/radiation/quality/duration/timing/severity/associated sxs/prior Treatment) HPI Comments: Patient is a 72 year old female past medical history significant for hypertension, history of breast cancer, anemia, metastatic non-small cell lung carcinoma presenting as an emergency department after developing left-sided, arm, leg numbness and weakness an hour and a half ago. Patient states the symptoms started as she is getting ready to leave her house but prior to onset of her symptoms and feeling well. Her daughter and husband state they noticed she is having a difficult time ambulating and using her left arm. Patient endorses that the symptoms have improved prior to arrival to ED. Patient states she has a right-sided throbbing headache has been present since onset of symptoms. She rates this pain 3/10. Patient denies any chest pain, shortness of breath, diaphoresis, nausea, vomiting. Patient is currently being treated for lung cancer by Dr. Shirline Frees. Her last CT scan was in June.     Past Medical History  Diagnosis Date  . Hypertension   . Anemia   . Breast cancer 2002  . Lung cancer dx'd 12/2010   Past Surgical History  Procedure Laterality Date  . Appendectomy    . Mastectomy      bilateral  . Lung surgery    . Cataracts      bilateral  . Colonoscopy     Family History  Problem Relation Age of Onset  . Colon cancer Sister   . Lung cancer Father   . Heart disease Mother    History  Substance Use Topics  . Smoking status: Never Smoker   . Smokeless tobacco: Never Used  . Alcohol Use: Yes     Comment: very limited, occasional   OB History   Grav Para Term Preterm Abortions TAB SAB Ect Mult Living                 Review of Systems  Constitutional: Negative for fever  and chills.  HENT: Negative.   Eyes: Negative.   Respiratory: Negative for shortness of breath.   Cardiovascular: Negative for chest pain and palpitations.  Gastrointestinal: Negative for nausea and vomiting.  Genitourinary: Negative.   Musculoskeletal: Negative for back pain.  Skin: Negative.   Neurological: Positive for weakness and numbness.    Allergies  Sulfa antibiotics  Home Medications   Current Outpatient Rx  Name  Route  Sig  Dispense  Refill  . amLODipine (NORVASC) 5 MG tablet   Oral   Take 5 mg by mouth daily.           . brimonidine-timolol (COMBIGAN) 0.2-0.5 % ophthalmic solution   Right Eye   Place 1 drop into the right eye every 12 (twelve) hours.         . Bromfenac Sodium (PROLENSA) 0.07 % SOLN   Ophthalmic   Apply 1 drop to eye at bedtime. In the right eye only         . Calcium Carb-Cholecalciferol (RA CALCIUM 600/VITAMIN D-3) 600-400 MG-UNIT TABS   Oral   Take 2 tablets by mouth daily.           . clindamycin (CLEOCIN T) 1 % lotion   Topical   Apply topically 2 (two) times daily.   60 mL   2   . cycloSPORINE (RESTASIS) 0.05 %  ophthalmic emulsion   Both Eyes   Place 1 drop into both eyes 2 (two) times daily.         Marland Kitchen erlotinib (TARCEVA) 150 MG tablet   Oral   Take 1 tablet (150 mg total) by mouth daily.   30 tablet   2   . escitalopram (LEXAPRO) 10 MG tablet   Oral   Take 10 mg by mouth daily.           . famotidine (PEPCID) 40 MG tablet   Oral   Take 40 mg by mouth daily.           Marland Kitchen FeFum-FePoly-FA-B Cmp-C-Biot (FOLIVANE-PLUS) CAPS      TAKE 1 CAPSULE BY MOUTH DAILY   90 capsule   0     Generic ZOX:WRUEAVW PLUS   . letrozole (FEMARA) 2.5 MG tablet   Oral   Take 1 tablet (2.5 mg total) by mouth daily.   90 tablet   2   . zolpidem (AMBIEN) 5 MG tablet      at bedtime as needed.           BP 154/62  Pulse 65  Temp(Src) 97.7 F (36.5 C) (Oral)  Resp 17  Ht 5\' 9"  (1.753 m)  Wt 145 lb (65.772 kg)   BMI 21.4 kg/m2  SpO2 99% Physical Exam  Constitutional: She is oriented to person, place, and time. She appears well-developed and well-nourished. No distress.  HENT:  Head: Normocephalic and atraumatic.  Mouth/Throat: Oropharynx is clear and moist.  Eyes: Conjunctivae and EOM are normal. Pupils are equal, round, and reactive to light.  Neck: Normal range of motion. Neck supple.  Cardiovascular: Normal rate, regular rhythm, normal heart sounds and intact distal pulses.   Pulmonary/Chest: Effort normal and breath sounds normal. No respiratory distress.  Abdominal: Soft. Bowel sounds are normal. There is no tenderness.  Musculoskeletal: Normal range of motion. She exhibits no edema and no tenderness.  Neurological: She is alert and oriented to person, place, and time. She has normal strength. No cranial nerve deficit or sensory deficit. She displays a negative Romberg sign. Gait normal.  No pronator drift. Finger-nose-finger intact bilaterally. Heel-knee-shin intact bilaterally.   Skin: Skin is warm and dry. She is not diaphoretic.  Psychiatric: She has a normal mood and affect.    ED Course  Procedures (including critical care time) Labs Reviewed  URINALYSIS, ROUTINE W REFLEX MICROSCOPIC - Abnormal; Notable for the following:    APPearance CLOUDY (*)    Leukocytes, UA SMALL (*)    All other components within normal limits  POCT I-STAT, CHEM 8 - Abnormal; Notable for the following:    Glucose, Bld 103 (*)    All other components within normal limits  PROTIME-INR  APTT  URINE MICROSCOPIC-ADD ON  POCT I-STAT TROPONIN I   Ct Head Wo Contrast  04/29/2013   *RADIOLOGY REPORT*  Clinical Data: Weakness.  CT HEAD WITHOUT CONTRAST  Technique:  Contiguous axial images were obtained from the base of the skull through the vertex without contrast.  Comparison: 01/08/2011  Findings: No acute intracranial abnormality.  Specifically, no hemorrhage, hydrocephalus, mass lesion, acute infarction, or  significant intracranial injury.  No acute calvarial abnormality. Visualized paranasal sinuses and mastoids clear.  Orbital soft tissues unremarkable.  IMPRESSION: No acute intracranial abnormality.   Original Report Authenticated By: Charlett Nose, M.D.   1. TIA (transient ischemic attack)   2. Hypertension     MDM  Pt presenting with history significant  for TIA. Symptoms resolved PTA. No neurofocal deficits on examination. CT scan w/o acute intracranial abnormality. Labs reviewed. Consulted Dr. Roseanne Reno for admission. Patient d/w with Dr. Radford Pax, agrees with plan. The patient appears reasonably stabilized for admission considering the current resources, flow, and capabilities available in the ED at this time, and I doubt any other [facility] requiring further screening and/or treatment in the ED prior to admission.    Lise Auer Ralene Gasparyan, PA-C 04/30/13 0009

## 2013-04-29 NOTE — ED Notes (Signed)
Dr. Sullivan at bedside

## 2013-04-29 NOTE — ED Notes (Signed)
Patient reports numbness in left hand, left leg, and lips following dinner. Patient states numbness in her leg has increased to the point she is unable to bear weight, her leg will give weigh.

## 2013-04-30 ENCOUNTER — Observation Stay (HOSPITAL_COMMUNITY): Payer: Medicare Other

## 2013-04-30 DIAGNOSIS — I6350 Cerebral infarction due to unspecified occlusion or stenosis of unspecified cerebral artery: Secondary | ICD-10-CM

## 2013-04-30 DIAGNOSIS — I639 Cerebral infarction, unspecified: Secondary | ICD-10-CM | POA: Diagnosis present

## 2013-04-30 DIAGNOSIS — C3410 Malignant neoplasm of upper lobe, unspecified bronchus or lung: Secondary | ICD-10-CM

## 2013-04-30 DIAGNOSIS — G459 Transient cerebral ischemic attack, unspecified: Secondary | ICD-10-CM

## 2013-04-30 DIAGNOSIS — I517 Cardiomegaly: Secondary | ICD-10-CM

## 2013-04-30 HISTORY — DX: Cerebral infarction, unspecified: I63.9

## 2013-04-30 LAB — LIPID PANEL
HDL: 93 mg/dL (ref >39)
LDL Cholesterol: 97 mg/dL (ref 0–99)
Triglycerides: 62 mg/dL (ref <150)
VLDL: 12 mg/dL (ref 0–40)

## 2013-04-30 LAB — COMPREHENSIVE METABOLIC PANEL
CO2: 27 mEq/L (ref 19–32)
Calcium: 9.3 mg/dL (ref 8.4–10.5)
Creatinine, Ser: 0.81 mg/dL (ref 0.50–1.10)
GFR calc Af Amer: 81 mL/min — ABNORMAL LOW (ref >90)
GFR calc non Af Amer: 70 mL/min — ABNORMAL LOW (ref >90)
Glucose, Bld: 102 mg/dL — ABNORMAL HIGH (ref 70–99)

## 2013-04-30 LAB — CBC
Hemoglobin: 12.1 g/dL (ref 12.0–15.0)
MCHC: 31.8 g/dL (ref 30.0–36.0)
RDW: 14 % (ref 11.5–15.5)

## 2013-04-30 LAB — CREATININE, SERUM
Creatinine, Ser: 0.89 mg/dL (ref 0.50–1.10)
GFR calc non Af Amer: 62 mL/min — ABNORMAL LOW (ref >90)

## 2013-04-30 MED ORDER — ZOLPIDEM TARTRATE 5 MG PO TABS: 5.0000 mg | ORAL_TABLET | Freq: Every evening | ORAL | Status: DC | PRN

## 2013-04-30 MED ORDER — ENOXAPARIN SODIUM 40 MG/0.4ML ~~LOC~~ SOLN
40.0000 mg | SUBCUTANEOUS | Status: DC
  Administered 2013-04-30: 40 mg via SUBCUTANEOUS
  Filled 2013-04-30: qty 0.4

## 2013-04-30 MED ORDER — CYCLOSPORINE 0.05 % OP EMUL
1.0000 [drp] | Freq: Two times a day (BID) | OPHTHALMIC | Status: DC
  Administered 2013-04-30: 1 [drp] via OPHTHALMIC
  Filled 2013-04-30 (×2): qty 1

## 2013-04-30 MED ORDER — BRIMONIDINE TARTRATE 0.2 % OP SOLN
1.0000 [drp] | Freq: Two times a day (BID) | OPHTHALMIC | Status: DC
  Administered 2013-04-30: 1 [drp] via OPHTHALMIC
  Filled 2013-04-30: qty 5

## 2013-04-30 MED ORDER — ASPIRIN 81 MG PO TABS: 81.0000 mg | ORAL_TABLET | Freq: Every day | ORAL | Status: DC

## 2013-04-30 MED ORDER — CALCIUM CARBONATE-VITAMIN D 500-200 MG-UNIT PO TABS
2.0000 | ORAL_TABLET | Freq: Every day | ORAL | Status: DC
  Administered 2013-04-30: 2 via ORAL
  Filled 2013-04-30: qty 2

## 2013-04-30 MED ORDER — LETROZOLE 2.5 MG PO TABS
2.5000 mg | ORAL_TABLET | Freq: Every day | ORAL | Status: DC
Start: 2013-04-30 — End: 2013-04-30
  Administered 2013-04-30: 2.5 mg via ORAL
  Filled 2013-04-30: qty 1

## 2013-04-30 MED ORDER — CLOPIDOGREL BISULFATE 75 MG PO TABS
75.0000 mg | ORAL_TABLET | Freq: Every day | ORAL | Status: DC
  Administered 2013-04-30: 75 mg via ORAL
  Filled 2013-04-30 (×2): qty 1

## 2013-04-30 MED ORDER — KETOROLAC TROMETHAMINE 0.5 % OP SOLN
1.0000 [drp] | Freq: Every day | OPHTHALMIC | Status: DC
  Administered 2013-04-30: 1 [drp] via OPHTHALMIC
  Filled 2013-04-30: qty 3

## 2013-04-30 MED ORDER — BRIMONIDINE TARTRATE-TIMOLOL 0.2-0.5 % OP SOLN: 1.0000 [drp] | Freq: Two times a day (BID) | OPHTHALMIC | Status: DC

## 2013-04-30 MED ORDER — AMLODIPINE BESYLATE 5 MG PO TABS
5.0000 mg | ORAL_TABLET | Freq: Every day | ORAL | Status: DC
  Administered 2013-04-30: 5 mg via ORAL
  Filled 2013-04-30: qty 1

## 2013-04-30 MED ORDER — ESCITALOPRAM OXALATE 10 MG PO TABS
10.0000 mg | ORAL_TABLET | Freq: Every day | ORAL | Status: DC
  Administered 2013-04-30: 10 mg via ORAL
  Filled 2013-04-30: qty 1

## 2013-04-30 MED ORDER — CLOPIDOGREL BISULFATE 75 MG PO TABS: 75.0000 mg | ORAL_TABLET | Freq: Every day | ORAL | Status: DC

## 2013-04-30 MED ORDER — ASPIRIN EC 81 MG PO TBEC
81.0000 mg | DELAYED_RELEASE_TABLET | Freq: Every day | ORAL | Status: DC
  Filled 2013-04-30: qty 1

## 2013-04-30 MED ORDER — FAMOTIDINE 40 MG PO TABS
40.0000 mg | ORAL_TABLET | Freq: Every day | ORAL | Status: DC
  Administered 2013-04-30: 40 mg via ORAL
  Filled 2013-04-30: qty 1

## 2013-04-30 MED ORDER — TIMOLOL MALEATE 0.5 % OP SOLN
1.0000 [drp] | Freq: Two times a day (BID) | OPHTHALMIC | Status: DC
  Administered 2013-04-30: 1 [drp] via OPHTHALMIC
  Filled 2013-04-30: qty 5

## 2013-04-30 MED ORDER — ERLOTINIB HCL 150 MG PO TABS: 150.0000 mg | ORAL_TABLET | Freq: Every day | ORAL | Status: DC

## 2013-04-30 NOTE — Progress Notes (Addendum)
NEURO HOSPITALIST PROGRESS NOTE   SUBJECTIVE:                                                                                                                         No complaints at this time. Having no residual left sided weakness. MRI was explained to patient and she understands.  OBJECTIVE:                                                                                                                           Vital signs in last 24 hours: Temp:  [97.7 F (36.5 C)-97.9 F (36.6 C)] 97.9 F (36.6 C) (07/11 0500) Pulse Rate:  [59-68] 59 (07/11 0500) Resp:  [13-20] 16 (07/11 0500) BP: (134-173)/(47-96) 138/47 mmHg (07/11 0500) SpO2:  [97 %-99 %] 99 % (07/11 0500) Weight:  [65.772 kg (145 lb)-66.3 kg (146 lb 2.6 oz)] 66.3 kg (146 lb 2.6 oz) (07/11 0100)  Intake/Output from previous day:   Intake/Output this shift:   Nutritional status: Cardiac  Past Medical History  Diagnosis Date  . Hypertension   . Anemia   . Breast cancer 2002  . Lung cancer dx'd 12/2010      Neurologic Exam:   Mental Status: Alert, oriented, thought content appropriate.  Speech fluent without evidence of aphasia.  Able to follow 3 step commands without difficulty. Cranial Nerves: II: Discs flat bilaterally; Visual fields grossly normal, pupils equal, round, reactive to light and accommodation III,IV, VI: ptosis not present, extra-ocular motions intact bilaterally V,VII: smile symmetric, left NL fold decrease at rest, facial light touch sensation normal bilaterally VIII: hearing normal bilaterally IX,X: gag reflex present XI: bilateral shoulder shrug XII: midline tongue extension Motor: Right : Upper extremity   5/5    Left:     Upper extremity   5/5  Lower extremity   5/5     Lower extremity   5/5 Tone and bulk:normal tone throughout; no atrophy noted Sensory: Pinprick and light touch intact throughout, bilaterally Deep Tendon Reflexes:  Right: Upper  Extremity   Left: Upper extremity   biceps (C-5 to C-6) 2/4   biceps (C-5 to C-6) 2/4 tricep (C7) 2/4    triceps (C7) 2/4 Brachioradialis (C6) 2/4  Brachioradialis (C6)  2/4  Lower Extremity Lower Extremity  quadriceps (L-2 to L-4) 2/4   quadriceps (L-2 to L-4) 2/4 Achilles (S1) 2/4   Achilles (S1) 2/4  Plantars: Right: downgoing   Left: downgoing Cerebellar: normal finger-to-nose,  normal heel-to-shin test CV: pulses palpable throughout   Lab Results: Lab Results  Component Value Date/Time   CHOL 202* 04/30/2013  4:30 AM   Lipid Panel  Recent Labs  04/30/13 0430  CHOL 202*  TRIG 62  HDL 93  CHOLHDL 2.2  VLDL 12  LDLCALC 97    Studies/Results: Ct Head Wo Contrast  04/29/2013   *RADIOLOGY REPORT*  Clinical Data: Weakness.  CT HEAD WITHOUT CONTRAST  Technique:  Contiguous axial images were obtained from the base of the skull through the vertex without contrast.  Comparison: 01/08/2011  Findings: No acute intracranial abnormality.  Specifically, no hemorrhage, hydrocephalus, mass lesion, acute infarction, or significant intracranial injury.  No acute calvarial abnormality. Visualized paranasal sinuses and mastoids clear.  Orbital soft tissues unremarkable.  IMPRESSION: No acute intracranial abnormality.   Original Report Authenticated By: Charlett Nose, M.D.   Mri Brain Without Contrast  04/30/2013   *RADIOLOGY REPORT*  Clinical Data:  New onset left-sided weakness  MRI HEAD WITHOUT CONTRAST MRA HEAD WITHOUT CONTRAST  Technique:  Multiplanar, multiecho pulse sequences of the brain and surrounding structures were obtained without intravenous contrast. Angiographic images of the head were obtained using MRA technique without contrast.  Comparison:  Head CT 04/29/2013.  MRI 06/28/2011.  MRI HEAD  Findings:  There is 1.5 cm acute infarction in the right occipital lobe.  No other acute infarction.  The brainstem and cerebellum are normal.  The cerebral hemispheres are otherwise normal  without evidence of any other old infarction.  No evidence of mass lesion, hemorrhage, hydrocephalus or extra-axial collection.  No pituitary mass.  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  Venous angioma in the left frontal white matter is unchanged without evidence of hemorrhage.  IMPRESSION: 1.5 cm acute infarction in the right occipital lobe.  No other focal brain insult.  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  This should not be clinically significant.  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  MRA HEAD  Findings: Both internal carotid arteries are widely patent into the brain.  No siphon stenosis.  The anterior and middle cerebral vessels are patent bilaterally.  There are stenoses in several of the branch vessels including the right temporal branch of the MCA and the left A1 segment.  Both vertebral arteries are widely patent to the basilar.  No basilar stenosis.  There are multiple stenoses in the posterior cerebral artery territories, most pronounced at the P1/P2 junction region on the right, consistent with the area of infarction.  IMPRESSION: No major vessel occlusion or correctable proximal stenosis.  Branch vessel stenoses noted in the anterior, middle and posterior cerebral artery territories.  These are most pronounced in both PCAs with the single most severe stenosis being at the right P1/P2 junction, the vessel serving the area of acute infarction.   Original Report Authenticated By: Paulina Fusi, M.D.   Mr Mra Head/brain Wo Cm  04/30/2013   *RADIOLOGY REPORT*  Clinical Data:  New onset left-sided weakness  MRI HEAD WITHOUT CONTRAST MRA HEAD WITHOUT CONTRAST  Technique:  Multiplanar, multiecho pulse sequences of the brain and surrounding structures were obtained without intravenous contrast. Angiographic images of the head were obtained using MRA technique without contrast.  Comparison:  Head CT 04/29/2013.   MRI 06/28/2011.  MRI HEAD  Findings:  There is 1.5 cm acute infarction in the right occipital lobe.  No other acute infarction.  The brainstem and cerebellum are normal.  The cerebral hemispheres are otherwise normal without evidence of any other old infarction.  No evidence of mass lesion, hemorrhage, hydrocephalus or extra-axial collection.  No pituitary mass.  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  Venous angioma in the left frontal white matter is unchanged without evidence of hemorrhage.  IMPRESSION: 1.5 cm acute infarction in the right occipital lobe.  No other focal brain insult.  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  This should not be clinically significant.  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  MRA HEAD  Findings: Both internal carotid arteries are widely patent into the brain.  No siphon stenosis.  The anterior and middle cerebral vessels are patent bilaterally.  There are stenoses in several of the branch vessels including the right temporal branch of the MCA and the left A1 segment.  Both vertebral arteries are widely patent to the basilar.  No basilar stenosis.  There are multiple stenoses in the posterior cerebral artery territories, most pronounced at the P1/P2 junction region on the right, consistent with the area of infarction.  IMPRESSION: No major vessel occlusion or correctable proximal stenosis.  Branch vessel stenoses noted in the anterior, middle and posterior cerebral artery territories.  These are most pronounced in both PCAs with the single most severe stenosis being at the right P1/P2 junction, the vessel serving the area of acute infarction.   Original Report Authenticated By: Paulina Fusi, M.D.    MEDICATIONS                                                                                                                        Scheduled: . amLODipine  5 mg Oral Daily  . brimonidine  1  drop Right Eye Q12H  . calcium-vitamin D  2 tablet Oral Daily  . clopidogrel  75 mg Oral Q breakfast  . cycloSPORINE  1 drop Both Eyes BID  . enoxaparin (LOVENOX) injection  40 mg Subcutaneous Q24H  . escitalopram  10 mg Oral Daily  . famotidine  40 mg Oral Daily  . ketorolac  1 drop Right Eye QHS  . letrozole  2.5 mg Oral Daily  . timolol  1 drop Right Eye BID   A1c--in precess Echo--pending Dopplers --pending  ASSESSMENT/PLAN:  72 YO female with acute right occipital lobe infarct.  NSR on tele and was on no antiplatelet prior to hospitalization and history of cancer on Femara and Tarceva. Patient back to her baseline at this time.   Recommend: 1) Echo 2) Carotid doppler 3) A1c 4) change to ASA 81 mg and Plavix 75 mg daily for three months then change to Plavix alone    Assessment and plan discussed with with attending physician and they are in agreement.    Felicie Morn PA-C [facility] [phone]  04/30/2013, 8:52 AM   I have reviewed the above note. Small stroke distal to PCA stenosis. HerMRA shows diffuse atherosclerotic disease and I suspect that this stroke is due to intracranial athersclerosis. I would favor dula antiplatelet therapy x 3 months followed by monotherapy.   Ritta Slot, MD [facility] [phone]  If 7pm- 7am, please page neurology on call at [phone].

## 2013-04-30 NOTE — Progress Notes (Addendum)
[facility] PROGRESS NOTE Assessment/Plan: CVA: - fasting lipid panel LDL < 100, HDL > 40. - MRI 7.11.2014: 1.5 cm acute infarction in the right occipital lobe - MRA of the brain without: No major vessel occlusion or correctable proximal stenosis - PT consult, OT consult pending, pass swallowing evaluation. - Echocardiogram pending.  - Carotid dopplers pending. - Prophylactic therapy-ASA & plavix for 3 months then Plavix alone. - Avoid D5 fluids as may be harmfull - Cardiac Monitoring no events. - Neurochecks q4h  - Spoke with Dr. Gaylyn Rong he recommended to stop Femara until pt is seen back on the oncology clinic.  Hypertension: - stable. Monitor.   Hernia, hiatal: -protonix.  Personal history of breast cancer/  Lung cancer, upper lobe - Tarceva and femara.   Code Status: full code  Family Communication: husband and daughter at bedside  Disposition Plan: home    Consultants:  neuro  Procedures:  MRI/MRA  Echo  CT head  Antibiotics:  none  HPI/Subjective: No complains feels better  Objective: Filed Vitals:   04/30/13 0100 04/30/13 0335 04/30/13 0500 04/30/13 0800  BP: 173/53 134/58 138/47   Pulse: 59  59   Temp: 97.8 F (36.6 C)  97.9 F (36.6 C) 98.3 F (36.8 C)  TempSrc: Oral  Oral   Resp: 20  16   Height: 5\' 8"  (1.727 m)     Weight: 66.3 kg (146 lb 2.6 oz)     SpO2: 97%  99%    No intake or output data in the 24 hours ending 04/30/13 0940 Filed Weights   04/29/13 2049 04/30/13 0100  Weight: 65.772 kg (145 lb) 66.3 kg (146 lb 2.6 oz)    Exam:  General: Alert, awake, oriented x3, in no acute distress.  HEENT: No bruits, no goiter.  Heart: Regular rate and rhythm, without murmurs, rubs, gallops.  Lungs: Good air movement, clear to auscultation Abdomen: Soft, nontender, nondistended, positive bowel sounds.  Neuro: Grossly intact, nonfocal.   Data Reviewed: Basic Metabolic Panel:  Recent Labs Lab 04/29/13 2100 04/29/13 2136  04/30/13 0430  NA  --  143 139  K  --  4.4 4.1  CL  --  104 105  CO2  --   --  27  GLUCOSE  --  103* 102*  BUN  --  18 14  CREATININE 0.89 0.90 0.81  CALCIUM  --   --  9.3   Liver Function Tests:  Recent Labs Lab 04/30/13 0430  AST 23  ALT 15  ALKPHOS 73  BILITOT 0.3  PROT 5.9*  ALBUMIN 3.0*   No results found for this basename: LIPASE, AMYLASE,  in the last 168 hours No results found for this basename: AMMONIA,  in the last 168 hours CBC:  Recent Labs Lab 04/29/13 2100 04/29/13 2136  WBC 6.2  --   HGB 12.1 12.9  HCT 38.0 38.0  MCV 90.7  --   PLT 415*  --    Cardiac Enzymes: No results found for this basename: CKTOTAL, CKMB, CKMBINDEX, TROPONINI,  in the last 168 hours BNP (last 3 results) No results found for this basename: PROBNP,  in the last 8760 hours CBG: No results found for this basename: GLUCAP,  in the last 168 hours  No results found for this or any previous visit (from the past 240 hour(s)).   Studies: Ct Head Wo Contrast  04/29/2013   *RADIOLOGY REPORT*  Clinical Data: Weakness.  CT HEAD WITHOUT CONTRAST  Technique:  Contiguous axial images  were obtained from the base of the skull through the vertex without contrast.  Comparison: 01/08/2011  Findings: No acute intracranial abnormality.  Specifically, no hemorrhage, hydrocephalus, mass lesion, acute infarction, or significant intracranial injury.  No acute calvarial abnormality. Visualized paranasal sinuses and mastoids clear.  Orbital soft tissues unremarkable.  IMPRESSION: No acute intracranial abnormality.   Original Report Authenticated By: Charlett Nose, M.D.   Mri Brain Without Contrast  04/30/2013   *RADIOLOGY REPORT*  Clinical Data:  New onset left-sided weakness  MRI HEAD WITHOUT CONTRAST MRA HEAD WITHOUT CONTRAST  Technique:  Multiplanar, multiecho pulse sequences of the brain and surrounding structures were obtained without intravenous contrast. Angiographic images of the head were obtained  using MRA technique without contrast.  Comparison:  Head CT 04/29/2013.  MRI 06/28/2011.  MRI HEAD  Findings:  There is 1.5 cm acute infarction in the right occipital lobe.  No other acute infarction.  The brainstem and cerebellum are normal.  The cerebral hemispheres are otherwise normal without evidence of any other old infarction.  No evidence of mass lesion, hemorrhage, hydrocephalus or extra-axial collection.  No pituitary mass.  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  Venous angioma in the left frontal white matter is unchanged without evidence of hemorrhage.  IMPRESSION: 1.5 cm acute infarction in the right occipital lobe.  No other focal brain insult.  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  This should not be clinically significant.  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  MRA HEAD  Findings: Both internal carotid arteries are widely patent into the brain.  No siphon stenosis.  The anterior and middle cerebral vessels are patent bilaterally.  There are stenoses in several of the branch vessels including the right temporal branch of the MCA and the left A1 segment.  Both vertebral arteries are widely patent to the basilar.  No basilar stenosis.  There are multiple stenoses in the posterior cerebral artery territories, most pronounced at the P1/P2 junction region on the right, consistent with the area of infarction.  IMPRESSION: No major vessel occlusion or correctable proximal stenosis.  Branch vessel stenoses noted in the anterior, middle and posterior cerebral artery territories.  These are most pronounced in both PCAs with the single most severe stenosis being at the right P1/P2 junction, the vessel serving the area of acute infarction.   Original Report Authenticated By: Paulina Fusi, M.D.   Mr Mra Head/brain Wo Cm  04/30/2013   *RADIOLOGY REPORT*  Clinical Data:  New onset left-sided weakness  MRI HEAD WITHOUT  CONTRAST MRA HEAD WITHOUT CONTRAST  Technique:  Multiplanar, multiecho pulse sequences of the brain and surrounding structures were obtained without intravenous contrast. Angiographic images of the head were obtained using MRA technique without contrast.  Comparison:  Head CT 04/29/2013.  MRI 06/28/2011.  MRI HEAD  Findings:  There is 1.5 cm acute infarction in the right occipital lobe.  No other acute infarction.  The brainstem and cerebellum are normal.  The cerebral hemispheres are otherwise normal without evidence of any other old infarction.  No evidence of mass lesion, hemorrhage, hydrocephalus or extra-axial collection.  No pituitary mass.  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  Venous angioma in the left frontal white matter is unchanged without evidence of hemorrhage.  IMPRESSION: 1.5 cm acute infarction in the right occipital lobe.  No other focal brain insult.  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  This should not be clinically significant.  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  MRA HEAD  Findings: Both internal carotid arteries are widely patent into the brain.  No siphon stenosis.  The anterior and middle cerebral vessels are patent bilaterally.  There are stenoses in several of the branch vessels including the right temporal branch of the MCA and the left A1 segment.  Both vertebral arteries are widely patent to the basilar.  No basilar stenosis.  There are multiple stenoses in the posterior cerebral artery territories, most pronounced at the P1/P2 junction region on the right, consistent with the area of infarction.  IMPRESSION: No major vessel occlusion or correctable proximal stenosis.  Branch vessel stenoses noted in the anterior, middle and posterior cerebral artery territories.  These are most pronounced in both PCAs with the single most severe stenosis being at the right P1/P2 junction, the vessel  serving the area of acute infarction.   Original Report Authenticated By: Paulina Fusi, M.D.    Scheduled Meds: . amLODipine  5 mg Oral Daily  . brimonidine  1 drop Right Eye Q12H  . calcium-vitamin D  2 tablet Oral Daily  . clopidogrel  75 mg Oral Q breakfast  . cycloSPORINE  1 drop Both Eyes BID  . enoxaparin (LOVENOX) injection  40 mg Subcutaneous Q24H  . escitalopram  10 mg Oral Daily  . famotidine  40 mg Oral Daily  . ketorolac  1 drop Right Eye QHS  . letrozole  2.5 mg Oral Daily  . timolol  1 drop Right Eye BID   Continuous Infusions:    Marinda Elk  [facility] Pager [phone]. If 8PM-8AM, please contact night-coverage at [email or web address], password [facility] 04/30/2013, 9:40 AM  LOS: 1 day

## 2013-04-30 NOTE — Progress Notes (Signed)
*  PRELIMINARY RESULTS* Vascular Ultrasound Carotid Duplex (Doppler) has been completed.  Preliminary findings: Bilateral:  Less than 39% ICA stenosis.  Vertebral artery flow is antegrade.      Farrel Demark, RDMS, RVT  04/30/2013, 9:40 AM

## 2013-04-30 NOTE — Discharge Summary (Signed)
Physician Discharge Summary  Kristin [name] QVZ:[id number] DOB: 1941/10/16 DOA: 04/29/2013  PCP: [name], MD  Admit date: 04/29/2013 Discharge date: 04/30/2013  Time spent: 35 minutes  Recommendations for Outpatient Follow-up:  1. Follow up with neurology  Discharge Diagnoses:  Principal Problem:   CVA (cerebral infarction) Active Problems:   Lung cancer, upper lobe   Personal history of breast cancer   Hernia, hiatal   Hypertension   Discharge Condition: stable  Diet recommendation: heart healthy diet  Filed Weights   04/29/13 2049 04/30/13 0100  Weight: 65.772 kg (145 lb) 66.3 kg (146 lb 2.6 oz)    History of present illness:  72 y.o. female presents to the emergency room with transient left-sided weakness. Her symptoms started at 7 PM and are now gone. She noticed that she is having a difficult time walking. She also noticed that her left arm, hand and leg felt numb. She required assistance walking. Also, the left side of her face felt numb. She has no previous history of stroke or TIA. She has a history of hypertension, previous breast cancer, lung cancer, maintained on Femara and Tarceva. CAT scan of the brain shows nothing acute. Patient has been evaluated by neurology who recommend hospitalist admission for TIA workup.   Hospital Course:  CVA:  - fasting lipid panel LDL < 100, HDL > 40.  - MRI 7.11.2014: 1.5 cm acute infarction in the right occipital lobe  - MRA of the brain without: No major vessel occlusion or correctable proximal stenosis  - PT consult, OT consult pending, pass swallowing evaluation.  - Echocardiogram normal EF - Carotid dopplers no significant stenosis.  - Prophylactic therapy-ASA & plavix for 3 months then Plavix alone.  - Avoid D5 fluids as may be harmfull  - Cardiac Monitoring no events.  - Neurochecks q4h  - Spoke with Dr. Gaylyn Rong he recommended to stop Femara until pt is seen back on the oncology clinic.   Hypertension:  - stable. Monitor.    Hernia, hiatal:  -protonix.   Personal history of breast cancer/ Lung cancer, upper lobe  - Tarceva and femara.   Procedures:  MRI  MRA  Echo  CT head  Consultations:  Neurology  Discharge Exam: Filed Vitals:   04/30/13 0335 04/30/13 0500 04/30/13 0800 04/30/13 1100  BP: 134/58 138/47  161/62  Pulse:  59  62  Temp:  97.9 F (36.6 C) 98.3 F (36.8 C) 98.3 F (36.8 C)  TempSrc:  Oral  Oral  Resp:  16  20  Height:      Weight:      SpO2:  99%  99%    See progress note  Discharge Instructions  Discharge Orders   Future Appointments Provider Department Dept Phone   05/05/2013 11:30 AM Krista Blue [facility] [phone]   05/05/2013 12:00 PM Si Gaul, MD [facility] MEDICAL ONCOLOGY [phone]   06/25/2013 12:30 PM Sherrie George, MD [facility] [phone]   Future Orders Complete By Expires     Diet - low sodium heart healthy  As directed     Increase activity slowly  As directed         Medication List    STOP taking these medications       letrozole 2.5 MG tablet  Commonly known as:  FEMARA      TAKE these medications       amLODipine 5 MG tablet  Commonly known as:  NORVASC  Take 5 mg by mouth daily.     aspirin 81 MG tablet  Take 1 tablet (81 mg total) by mouth daily.     brimonidine-timolol 0.2-0.5 % ophthalmic solution  Commonly known as:  COMBIGAN  Place 1 drop into the right eye every 12 (twelve) hours.     clindamycin 1 % lotion  Commonly known as:  CLEOCIN T  Apply topically 2 (two) times daily.     clopidogrel 75 MG tablet  Commonly known as:  PLAVIX  Take 1 tablet (75 mg total) by mouth daily with breakfast.     cycloSPORINE 0.05 % ophthalmic emulsion  Commonly known as:  RESTASIS  Place 1 drop into both eyes 2 (two) times daily.     erlotinib 150 MG tablet  Commonly known as:  TARCEVA  Take 1 tablet (150 mg total) by mouth daily.      escitalopram 10 MG tablet  Commonly known as:  LEXAPRO  Take 10 mg by mouth daily.     famotidine 40 MG tablet  Commonly known as:  PEPCID  Take 40 mg by mouth daily.     FOLIVANE-PLUS Caps  TAKE 1 CAPSULE BY MOUTH DAILY     PROLENSA 0.07 % Soln  Generic drug:  Bromfenac Sodium  Apply 1 drop to eye at bedtime. In the right eye only     RA CALCIUM 600/VITAMIN D-3 600-400 MG-UNIT Tabs  Generic drug:  Calcium Carb-Cholecalciferol  Take 2 tablets by mouth daily.     zolpidem 5 MG tablet  Commonly known as:  AMBIEN  at bedtime as needed.       Allergies  Allergen Reactions  . Sulfa Antibiotics Rash       Follow-up Information   Follow up with SETHI,PRAMODKUMAR P, MD In 2 weeks. (hospital follow up)    Contact information:   [address] [place] [address] [phone]        The results of significant diagnostics from this hospitalization (including imaging, microbiology, ancillary and laboratory) are listed below for reference.    Significant Diagnostic Studies: Ct Head Wo Contrast  04/29/2013   *RADIOLOGY REPORT*  Clinical Data: Weakness.  CT HEAD WITHOUT CONTRAST  Technique:  Contiguous axial images were obtained from the base of the skull through the vertex without contrast.  Comparison: 01/08/2011  Findings: No acute intracranial abnormality.  Specifically, no hemorrhage, hydrocephalus, mass lesion, acute infarction, or significant intracranial injury.  No acute calvarial abnormality. Visualized paranasal sinuses and mastoids clear.  Orbital soft tissues unremarkable.  IMPRESSION: No acute intracranial abnormality.   Original Report Authenticated By: Charlett Nose, M.D.   Ct Chest Wo Contrast  04/05/2013   *RADIOLOGY REPORT*  Clinical Data: Follow-up lung carcinoma.  On oral chemotherapy.  CT CHEST WITHOUT CONTRAST  Technique:  Multidetector CT imaging of the chest was performed following the standard protocol without IV contrast.  Comparison: 01/04/2013   Findings: Postoperative scarring in the left upper lung field remains stable.  No suspicious pulmonary nodules or masses are identified.  Mild scarring at left lung base is also unchanged.  No evidence of acute infiltrate or central endobronchial lesion.  No evidence of pleural or pericardial effusion.  A moderate size hiatal hernia is again seen.  No other mediastinal or hilar masses are seen.  No evidence of lymphadenopathy in the thorax.  No evidence of chest wall mass or suspicious bone lesions. Cyst in the dome of the right hepatic lobe remains stable.  IMPRESSION:  1.  No active disease or acute findings.  No evidence of recurrent or metastatic carcinoma within the thorax. 2.  Stable moderate hiatal hernia.   Original Report Authenticated By: Myles Rosenthal, M.D.   Mri Brain Without Contrast  04/30/2013   *RADIOLOGY REPORT*  Clinical Data:  New onset left-sided weakness  MRI HEAD WITHOUT CONTRAST MRA HEAD WITHOUT CONTRAST  Technique:  Multiplanar, multiecho pulse sequences of the brain and surrounding structures were obtained without intravenous contrast. Angiographic images of the head were obtained using MRA technique without contrast.  Comparison:  Head CT 04/29/2013.  MRI 06/28/2011.  MRI HEAD  Findings:  There is 1.5 cm acute infarction in the right occipital lobe.  No other acute infarction.  The brainstem and cerebellum are normal.  The cerebral hemispheres are otherwise normal without evidence of any other old infarction.  No evidence of mass lesion, hemorrhage, hydrocephalus or extra-axial collection.  No pituitary mass.  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  Venous angioma in the left frontal white matter is unchanged without evidence of hemorrhage.  IMPRESSION: 1.5 cm acute infarction in the right occipital lobe.  No other focal brain insult.  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  This should not be clinically significant.  Chronic  benign hemangioma of the left parietal calvarium, unchanged.  MRA HEAD  Findings: Both internal carotid arteries are widely patent into the brain.  No siphon stenosis.  The anterior and middle cerebral vessels are patent bilaterally.  There are stenoses in several of the branch vessels including the right temporal branch of the MCA and the left A1 segment.  Both vertebral arteries are widely patent to the basilar.  No basilar stenosis.  There are multiple stenoses in the posterior cerebral artery territories, most pronounced at the P1/P2 junction region on the right, consistent with the area of infarction.  IMPRESSION: No major vessel occlusion or correctable proximal stenosis.  Branch vessel stenoses noted in the anterior, middle and posterior cerebral artery territories.  These are most pronounced in both PCAs with the single most severe stenosis being at the right P1/P2 junction, the vessel serving the area of acute infarction.   Original Report Authenticated By: Paulina Fusi, M.D.   Mr Mra Head/brain Wo Cm  04/30/2013   *RADIOLOGY REPORT*  Clinical Data:  New onset left-sided weakness  MRI HEAD WITHOUT CONTRAST MRA HEAD WITHOUT CONTRAST  Technique:  Multiplanar, multiecho pulse sequences of the brain and surrounding structures were obtained without intravenous contrast. Angiographic images of the head were obtained using MRA technique without contrast.  Comparison:  Head CT 04/29/2013.  MRI 06/28/2011.  MRI HEAD  Findings:  There is 1.5 cm acute infarction in the right occipital lobe.  No other acute infarction.  The brainstem and cerebellum are normal.  The cerebral hemispheres are otherwise normal without evidence of any other old infarction.  No evidence of mass lesion, hemorrhage, hydrocephalus or extra-axial collection.  No pituitary mass.  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  Venous angioma in the left frontal white matter is unchanged without evidence of  hemorrhage.  IMPRESSION: 1.5 cm acute infarction in the right occipital lobe.  No other focal brain insult.  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  This should not be clinically significant.  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  MRA HEAD  Findings: Both internal carotid arteries are widely patent into  the brain.  No siphon stenosis.  The anterior and middle cerebral vessels are patent bilaterally.  There are stenoses in several of the branch vessels including the right temporal branch of the MCA and the left A1 segment.  Both vertebral arteries are widely patent to the basilar.  No basilar stenosis.  There are multiple stenoses in the posterior cerebral artery territories, most pronounced at the P1/P2 junction region on the right, consistent with the area of infarction.  IMPRESSION: No major vessel occlusion or correctable proximal stenosis.  Branch vessel stenoses noted in the anterior, middle and posterior cerebral artery territories.  These are most pronounced in both PCAs with the single most severe stenosis being at the right P1/P2 junction, the vessel serving the area of acute infarction.   Original Report Authenticated By: Paulina Fusi, M.D.    Microbiology: No results found for this or any previous visit (from the past 240 hour(s)).   Labs: Basic Metabolic Panel:  Recent Labs Lab 04/29/13 2100 04/29/13 2136 04/30/13 0430  NA  --  143 139  K  --  4.4 4.1  CL  --  104 105  CO2  --   --  27  GLUCOSE  --  103* 102*  BUN  --  18 14  CREATININE 0.89 0.90 0.81  CALCIUM  --   --  9.3   Liver Function Tests:  Recent Labs Lab 04/30/13 0430  AST 23  ALT 15  ALKPHOS 73  BILITOT 0.3  PROT 5.9*  ALBUMIN 3.0*   No results found for this basename: LIPASE, AMYLASE,  in the last 168 hours No results found for this basename: AMMONIA,  in the last 168 hours CBC:  Recent Labs Lab 04/29/13 2100 04/29/13 2136  WBC 6.2  --   HGB 12.1  12.9  HCT 38.0 38.0  MCV 90.7  --   PLT 415*  --    Cardiac Enzymes: No results found for this basename: CKTOTAL, CKMB, CKMBINDEX, TROPONINI,  in the last 168 hours BNP: BNP (last 3 results) No results found for this basename: PROBNP,  in the last 8760 hours CBG: No results found for this basename: GLUCAP,  in the last 168 hours     Signed:  Marinda Elk  [facility] 04/30/2013, 2:18 PM

## 2013-04-30 NOTE — H&P (Addendum)
[facility] History and Physical  Kristin [name] ZOX:[id number] DOB: Jul 15, 1941 DOA: 04/29/2013  Referring physician: Radford Pax PCP: [name], MD  Specialists: Roseanne Reno  Chief Complaint: Left-sided weakness  HPI: Kristin [name] is a 72 y.o. female presents to the emergency room with transient left-sided weakness. Her symptoms started at 7 PM and are now gone. She noticed that she is having a difficult time walking. She also noticed that her left arm, hand and leg felt numb. She required assistance walking. Also, the left side of her face felt numb. She has no previous history of stroke or TIA. She has a history of hypertension, previous breast cancer, lung cancer, maintained on Femara and Tarceva. CAT scan of the brain shows nothing acute. Patient has been evaluated by neurology who recommend hospitalist admission for TIA workup.  Review of Systems: Systems reviewed and as above otherwise negative.  Past Medical History  Diagnosis Date  . Hypertension   . Anemia   . Breast cancer 2002  . Lung cancer dx'd 12/2010   Past Surgical History  Procedure Laterality Date  . Appendectomy    . Mastectomy      bilateral  . Lung surgery    . Cataracts      bilateral  . Colonoscopy     Social History:  reports that she has never smoked. She has never used smokeless tobacco. She reports that  drinks alcohol. She reports that she does not use illicit drugs. lives in [place] with her husband   Allergies  Allergen Reactions  . Sulfa Antibiotics Rash    Family History  Problem Relation Age of Onset  . Colon cancer Sister   . Lung cancer Father   . Heart disease Mother    Prior to Admission medications   Medication Sig Start Date End Date Taking? Authorizing Provider  amLODipine (NORVASC) 5 MG tablet Take 5 mg by mouth daily.     Yes Historical Provider, MD  brimonidine-timolol (COMBIGAN) 0.2-0.5 % ophthalmic solution Place 1 drop into the right eye every 12 (twelve)  hours.   Yes Historical Provider, MD  Bromfenac Sodium (PROLENSA) 0.07 % SOLN Apply 1 drop to eye at bedtime. In the right eye only   Yes Historical Provider, MD  Calcium Carb-Cholecalciferol (RA CALCIUM 600/VITAMIN D-3) 600-400 MG-UNIT TABS Take 2 tablets by mouth daily.     Yes Historical Provider, MD  clindamycin (CLEOCIN T) 1 % lotion Apply topically 2 (two) times daily. 08/24/12  Yes Adrena E Johnson, PA-C  cycloSPORINE (RESTASIS) 0.05 % ophthalmic emulsion Place 1 drop into both eyes 2 (two) times daily.   Yes Historical Provider, MD  erlotinib (TARCEVA) 150 MG tablet Take 1 tablet (150 mg total) by mouth daily. 02/26/13  Yes Si Gaul, MD  escitalopram (LEXAPRO) 10 MG tablet Take 10 mg by mouth daily.     Yes Historical Provider, MD  famotidine (PEPCID) 40 MG tablet Take 40 mg by mouth daily.     Yes Historical Provider, MD  FeFum-FePoly-FA-B Cmp-C-Biot Henreitta Cea) CAPS TAKE 1 CAPSULE BY MOUTH DAILY 04/08/13  Yes Si Gaul, MD  letrozole [facility]) 2.5 MG tablet Take 1 tablet (2.5 mg total) by mouth daily. 03/04/13  Yes Si Gaul, MD  zolpidem (AMBIEN) 5 MG tablet at bedtime as needed.  02/18/12  Yes Historical Provider, MD   Physical Exam: Filed Vitals:   04/29/13 2200 04/29/13 2230 04/29/13 2300 04/29/13 2330  BP: 136/50 144/54 159/60 154/62  Pulse: 64 63 68 65  Temp:  TempSrc:      Resp: 19 19 18 17   Height:      Weight:      SpO2: 97% 98% 99% 99%   BP 154/62  Pulse 65  Temp(Src) 97.7 F (36.5 C) (Oral)  Resp 17  Ht 5\' 9"  (1.753 m)  Wt 65.772 kg (145 lb)  BMI 21.4 kg/m2  SpO2 99%  General Appearance:    Alert, cooperative, no distress, appears stated age  Head:    Normocephalic, without obvious abnormality, atraumatic  Eyes:    PERRL, bilateral lens implants noted      Nose:   Nares normal, septum midline, mucosa normal, no drainage    or sinus tenderness  Throat:   Lips, mucosa, and tongue normal; teeth and gums normal  Neck:   Supple,  symmetrical, trachea midline, no adenopathy;    thyroid:  no enlargement/tenderness/nodules; no carotid   bruit or JVD  Back:     Symmetric, no curvature, ROM normal, no CVA tenderness  Lungs:     Clear to auscultation bilaterally, respirations unlabored  Chest Wall:    No tenderness or deformity   Heart:    Regular rate and rhythm, S1 and S2 normal, no murmur, rub   or gallop     Abdomen:     Soft, non-tender, bowel sounds active all four quadrants,    no masses, no organomegaly  Genitalia:   deferred   Rectal:   deferred  Extremities:   Extremities normal, atraumatic, no cyanosis or edema  Pulses:   2+ and symmetric all extremities  Skin:   Skin color, texture, turgor normal, no rashes or lesions  Lymph nodes:   Cervical, supraclavicular, and axillary nodes normal  Neurologic:   CNII-XII intact, normal strength, sensation and reflexes    throughout     Psychiatric: Normal affect  Labs on Admission:  Basic Metabolic Panel:  Recent Labs Lab 04/29/13 2136  NA 143  K 4.4  CL 104  GLUCOSE 103*  BUN 18  CREATININE 0.90   Liver Function Tests: No results found for this basename: AST, ALT, ALKPHOS, BILITOT, PROT, ALBUMIN,  in the last 168 hours No results found for this basename: LIPASE, AMYLASE,  in the last 168 hours No results found for this basename: AMMONIA,  in the last 168 hours CBC:  Recent Labs Lab 04/29/13 2136  HGB 12.9  HCT 38.0   Cardiac Enzymes: No results found for this basename: CKTOTAL, CKMB, CKMBINDEX, TROPONINI,  in the last 168 hours  BNP (last 3 results) No results found for this basename: PROBNP,  in the last 8760 hours CBG: No results found for this basename: GLUCAP,  in the last 168 hours  Radiological Exams on Admission: Ct Head Wo Contrast  04/29/2013   *RADIOLOGY REPORT*  Clinical Data: Weakness.  CT HEAD WITHOUT CONTRAST  Technique:  Contiguous axial images were obtained from the base of the skull through the vertex without contrast.   Comparison: 01/08/2011  Findings: No acute intracranial abnormality.  Specifically, no hemorrhage, hydrocephalus, mass lesion, acute infarction, or significant intracranial injury.  No acute calvarial abnormality. Visualized paranasal sinuses and mastoids clear.  Orbital soft tissues unremarkable.  IMPRESSION: No acute intracranial abnormality.   Original Report Authenticated By: Charlett Nose, M.D.    EKG:  NSR  Assessment/Plan Principal Problem:   TIA (transient ischemic attack): Observation, telemetry. TIA workup. Neuro recommends ASA 81 mg. Consider discussing with Dr. Shirline Frees in am whether change in Tarceva and Femara indicated. Active  Problems:   Lung cancer, metastatic: per last office note, no progression and tolerating Tarceva   Personal history of breast cancer, s/p bilateral mastectomy   Hernia, hiatal   Hypertension  Code Status: full code Family Communication: husband and daughter at bedside Disposition Plan: home  Time spent: 60 min  Christiane Ha [facility] Pager [phone]  If 7PM-7AM, please contact night-coverage [email or web address] Password [facility] 04/30/2013, 12:21 AM

## 2013-04-30 NOTE — Progress Notes (Signed)
Discharge to home, family at bedside. Discharge instructions and follow up appointment done and was given to the patient.PIV removed no s/s of infiltration and swelling noted.

## 2013-04-30 NOTE — Progress Notes (Signed)
Seen  And evaluated by PT prior to discharge. Patient discharge to home, alert and oriented no complaints of any pain or discomfort upon discharge. PIV removed no s/s of infiltration or swelling noted. D/c instructions  And follow up appointments done and was given to the patient.

## 2013-04-30 NOTE — Evaluation (Signed)
Physical Therapy Evaluation Patient Details Name: Kristin [name] MRN: [id number] DOB: 1941/07/18 Today's Date: 04/30/2013 Time: 4098-1191 PT Time Calculation (min): 9 min  PT Assessment / Plan / Recommendation History of Present Illness  Kristin [name] is a 72 y.o. female presents to the emergency room with transient left-sided weakness. Her symptoms started at 7 PM and are now gone. She noticed that she is having a difficult time walking. She also noticed that her left arm, hand and leg felt numb. She required assistance walking. Also, the left side of her face felt numb. She has no previous history of stroke or TIA. She has a history of hypertension, previous breast cancer, lung cancer, maintained on Femara and Tarceva. CAT scan of the brain shows nothing acute. Patient has been evaluated by neurology who recommend hospitalist admission for TIA workup  Clinical Impression  On eval, pt was independent with mobility. Able to ambulate ~200 feet without assistive device or difficulty (while wearing flip flops :)  No balance deficits or gait abnormality noted. No follow up PT needs at this time. PT will sign off.     PT Assessment  Patent does not need any further PT services    Follow Up Recommendations  No PT follow up    Does the patient have the potential to tolerate intense rehabilitation      Barriers to Discharge        Equipment Recommendations  None recommended by PT    Recommendations for Other Services     Frequency      Precautions / Restrictions Precautions Precautions: None Restrictions Weight Bearing Restrictions: No   Pertinent Vitals/Pain No c/o pain      Mobility  Bed Mobility Bed Mobility: Supine to Sit Supine to Sit: 7: Independent Transfers Transfers: Sit to Stand;Stand to Sit Sit to Stand: 7: Independent Stand to Sit: 7: Independent Ambulation/Gait Ambulation/Gait Assistance: 7: Independent Ambulation Distance (Feet): 200 Feet Assistive device:  None Ambulation/Gait Assistance Details: good gait speed and control. no lob Stairs: No    Exercises     PT Diagnosis:    PT Problem List:   PT Treatment Interventions:       PT Goals(Current goals can be found in the care plan section)    Visit Information  Last PT Received On: 04/30/13 Assistance Needed: +1 History of Present Illness: Kristin [name] is a 72 y.o. female presents to the emergency room with transient left-sided weakness. Her symptoms started at 7 PM and are now gone. She noticed that she is having a difficult time walking. She also noticed that her left arm, hand and leg felt numb. She required assistance walking. Also, the left side of her face felt numb. She has no previous history of stroke or TIA. She has a history of hypertension, previous breast cancer, lung cancer, maintained on Femara and Tarceva. CAT scan of the brain shows nothing acute. Patient has been evaluated by neurology who recommend hospitalist admission for TIA workup       Prior Functioning  Home Living Family/patient expects to be discharged to:: Private residence Living Arrangements: Spouse/significant other Type of Home: House Home Access: Level entry Home Layout: One level Home Equipment: None Prior Function Level of Independence: Independent Communication Communication: No difficulties    Cognition  Cognition Arousal/Alertness: Awake/[name] Behavior During Therapy: WFL for tasks assessed/performed Overall Cognitive Status: Within Functional Limits for tasks assessed    Extremity/Trunk Assessment Upper Extremity Assessment Upper Extremity Assessment: Defer to OT evaluation Lower Extremity Assessment Lower  Extremity Assessment: Overall WFL for tasks assessed Cervical / Trunk Assessment Cervical / Trunk Assessment: Kyphotic   Balance Balance Balance Assessed: Yes Static Standing Balance Static Standing - Balance Support: No upper extremity supported Static Standing - Level of  Assistance: 7: Independent Static Standing - Comment/# of Minutes: No LOB or difficulty Dynamic Standing Balance Dynamic Standing - Balance Support: No upper extremity supported Dynamic Standing - Level of Assistance: 7: Independent High Level Balance High Level Balance Activites: Backward walking;Turns;Direction changes;Sudden stops;Head turns High Level Balance Comments: Pt able to perform all tasks without LOB or difficulty.   End of Session PT - End of Session Activity Tolerance: Patient tolerated treatment well Patient left: in bed;with call bell/phone within reach;with family/visitor present  GP Functional Assessment Tool Used: clinical judgement Functional Limitation: Mobility: Walking and moving around Mobility: Walking and Moving Around Current Status (A5409): 0 percent impaired, limited or restricted Mobility: Walking and Moving Around Goal Status (W1191): 0 percent impaired, limited or restricted Mobility: Walking and Moving Around Discharge Status [phone]): 0 percent impaired, limited or restricted   Kristin [name] [facility] 04/30/2013, 3:56 PM

## 2013-04-30 NOTE — Progress Notes (Signed)
MEDICATION RELATED CONSULT NOTE - INITIAL   Pharmacy Consult for Tarceva Indication: On medication prior to admit--possible hold criteria met.  Allergies  Allergen Reactions  . Sulfa Antibiotics Rash    Patient Measurements: Height: 5\' 8"  (172.7 cm) Weight: 146 lb 2.6 oz (66.3 kg) IBW/kg (Calculated) : 63.9 Adjusted Body Weight:   Vital Signs: Temp: 97.9 F (36.6 C) (07/11 0500) Temp src: Oral (07/11 0500) BP: 138/47 mmHg (07/11 0500) Pulse Rate: 59 (07/11 0500) Intake/Output from previous day:   Intake/Output from this shift:    Labs:  Recent Labs  04/29/13 2100 04/29/13 2130 04/29/13 2136 04/30/13 0430  WBC 6.2  --   --   --   HGB 12.1  --  12.9  --   HCT 38.0  --  38.0  --   PLT 415*  --   --   --   APTT  --  28  --   --   CREATININE 0.89  --  0.90 0.81  ALBUMIN  --   --   --  3.0*  PROT  --   --   --  5.9*  AST  --   --   --  23  ALT  --   --   --  15  ALKPHOS  --   --   --  73  BILITOT  --   --   --  0.3   Estimated Creatinine Clearance: 61.5 ml/min (by C-G formula based on Cr of 0.81).   Microbiology: No results found for this or any previous visit (from the past 720 hour(s)).  Medical History: Past Medical History  Diagnosis Date  . Hypertension   . Anemia   . Breast cancer 2002  . Lung cancer dx'd 12/2010    Medications:  Prescriptions prior to admission  Medication Sig Dispense Refill  . amLODipine (NORVASC) 5 MG tablet Take 5 mg by mouth daily.        . brimonidine-timolol (COMBIGAN) 0.2-0.5 % ophthalmic solution Place 1 drop into the right eye every 12 (twelve) hours.      . Bromfenac Sodium (PROLENSA) 0.07 % SOLN Apply 1 drop to eye at bedtime. In the right eye only      . Calcium Carb-Cholecalciferol (RA CALCIUM 600/VITAMIN D-3) 600-400 MG-UNIT TABS Take 2 tablets by mouth daily.        . clindamycin (CLEOCIN T) 1 % lotion Apply topically 2 (two) times daily.  60 mL  2  . cycloSPORINE (RESTASIS) 0.05 % ophthalmic emulsion Place 1 drop  into both eyes 2 (two) times daily.      Marland Kitchen erlotinib (TARCEVA) 150 MG tablet Take 1 tablet (150 mg total) by mouth daily.  30 tablet  2  . escitalopram (LEXAPRO) 10 MG tablet Take 10 mg by mouth daily.        . famotidine (PEPCID) 40 MG tablet Take 40 mg by mouth daily.        Marland Kitchen FeFum-FePoly-FA-B Cmp-C-Biot (FOLIVANE-PLUS) CAPS TAKE 1 CAPSULE BY MOUTH DAILY  90 capsule  0  . letrozole (FEMARA) 2.5 MG tablet Take 1 tablet (2.5 mg total) by mouth daily.  90 tablet  2  . zolpidem (AMBIEN) 5 MG tablet at bedtime as needed.         Assessment: Patient on tarceva prior to admit.  Patient being worked up for TIA.  Noted in H&P: "Consider discussing with Dr. Shirline Frees in am whether change in Tarceva and Femara indicated."  Goal of Therapy:  Hold medication if mets any of listed criteria: Erlotinib (Tarceva) . SCr > 1.5x baseline (or > 2 if baseline unknown) . AST or ALT > 3x ULN . Bili > 1.5x ULN . Acute coronary syndrome . Acute CVA . Bullous or exfoliative skin eruption . Gastrointestinal perforation . Unexplained pneumonitis / hypoxemia  Plan:  D/c tarceva for now, follow up if plan to restart  Aleene Davidson Crowford 04/30/2013,6:54 AM

## 2013-05-02 NOTE — ED Provider Notes (Signed)
Medical screening examination/treatment/procedure(s) were performed by non-physician practitioner and as supervising physician I was immediately available for consultation/collaboration.   Shafter Jupin L Seng Larch, MD 05/02/13 1112 

## 2013-05-05 ENCOUNTER — Ambulatory Visit (HOSPITAL_BASED_OUTPATIENT_CLINIC_OR_DEPARTMENT_OTHER): Payer: Medicare Other | Admitting: Internal Medicine

## 2013-05-05 ENCOUNTER — Telehealth: Payer: Self-pay | Admitting: Internal Medicine

## 2013-05-05 ENCOUNTER — Encounter: Payer: Self-pay | Admitting: Internal Medicine

## 2013-05-05 ENCOUNTER — Other Ambulatory Visit (HOSPITAL_BASED_OUTPATIENT_CLINIC_OR_DEPARTMENT_OTHER): Payer: Medicare Other | Admitting: Lab

## 2013-05-05 DIAGNOSIS — C3410 Malignant neoplasm of upper lobe, unspecified bronchus or lung: Secondary | ICD-10-CM

## 2013-05-05 LAB — CBC WITH DIFFERENTIAL/PLATELET
Basophils Absolute: 0.1 10*3/uL (ref 0.0–0.1)
Eosinophils Absolute: 0.3 10*3/uL (ref 0.0–0.5)
HGB: 12.3 g/dL (ref 11.6–15.9)
MONO#: 0.6 10*3/uL (ref 0.1–0.9)
NEUT#: 3.2 10*3/uL (ref 1.5–6.5)
RDW: 14.2 % (ref 11.2–14.5)
lymph#: 1.1 10*3/uL (ref 0.9–3.3)

## 2013-05-05 LAB — COMPREHENSIVE METABOLIC PANEL (CC13)
AST: 19 U/L (ref 5–34)
Albumin: 3.3 g/dL — ABNORMAL LOW (ref 3.5–5.0)
BUN: 20.1 mg/dL (ref 7.0–26.0)
Calcium: 9.4 mg/dL (ref 8.4–10.4)
Chloride: 106 mEq/L (ref 98–109)
Glucose: 100 mg/dl (ref 70–140)
Potassium: 4.2 mEq/L (ref 3.5–5.1)

## 2013-05-05 NOTE — Telephone Encounter (Signed)
Gave pt appt for lab and Md on August 2014 °

## 2013-05-05 NOTE — Patient Instructions (Signed)
Continue treatment with Tarceva and resume Femara. Followup visit in one month.

## 2013-05-05 NOTE — Progress Notes (Signed)
[facility] [phone]   [phone]  OFFICE PROGRESS NOTE  [name], MD [address] [place] [address]  Principle Diagnosis:  1) Metastatic non-small cell lung cancer adenocarcinoma with positive for EGFR mutation at exon 21 diagnosed in March of 2012.  2) history of breast cancer.   Prior Therapy: None.   Current therapy:  1) Tarceva at 150 mg by mouth daily status post 28 months therapy.  2) Femara 2.5 mg by mouth daily.   INTERVAL HISTORY: Luciel Brickman 72 y.o. female returns to the clinic today for followup visit accompanied by her daughter-in-law. The patient is doing fine today with no specific complaints. She was recently diagnosed with a stroke and started on treatment with Plavix and aspirin. She has complete resolution of the weakness of her upper and lower extremities. She denied having any complaints today. She denied having any chest pain or shortness of breath, no cough or hemoptysis. She has no weight loss or night sweats. She has no nausea or vomiting.  MEDICAL HISTORY: Past Medical History  Diagnosis Date  . Hypertension   . Anemia   . Breast cancer 2002  . Lung cancer dx'd 12/2010    ALLERGIES:  is allergic to sulfa antibiotics.  MEDICATIONS:  Current Outpatient Prescriptions  Medication Sig Dispense Refill  . amLODipine (NORVASC) 5 MG tablet Take 5 mg by mouth daily.        Marland Kitchen aspirin 81 MG tablet Take 1 tablet (81 mg total) by mouth daily.  30 tablet  0  . brimonidine-timolol (COMBIGAN) 0.2-0.5 % ophthalmic solution Place 1 drop into the right eye every 12 (twelve) hours.      . Bromfenac Sodium (PROLENSA) 0.07 % SOLN Apply 1 drop to eye at bedtime. In the right eye only      . Calcium Carb-Cholecalciferol (RA CALCIUM 600/VITAMIN D-3) 600-400 MG-UNIT TABS Take 2 tablets by mouth daily.        . clindamycin (CLEOCIN T) 1 % lotion Apply topically 2 (two) times daily.  60 mL  2  . clopidogrel (PLAVIX) 75  MG tablet Take 1 tablet (75 mg total) by mouth daily with breakfast.  30 tablet  0  . cycloSPORINE (RESTASIS) 0.05 % ophthalmic emulsion Place 1 drop into both eyes 2 (two) times daily.      Marland Kitchen erlotinib (TARCEVA) 150 MG tablet Take 1 tablet (150 mg total) by mouth daily.  30 tablet  2  . escitalopram (LEXAPRO) 10 MG tablet Take 10 mg by mouth daily.        . famotidine (PEPCID) 40 MG tablet Take 40 mg by mouth daily.        Marland Kitchen FeFum-FePoly-FA-B Cmp-C-Biot (FOLIVANE-PLUS) CAPS TAKE 1 CAPSULE BY MOUTH DAILY  90 capsule  0  . zolpidem (AMBIEN) 5 MG tablet at bedtime as needed.        No current facility-administered medications for this visit.    SURGICAL HISTORY:  Past Surgical History  Procedure Laterality Date  . Appendectomy    . Mastectomy      bilateral  . Lung surgery    . Cataracts      bilateral  . Colonoscopy      REVIEW OF SYSTEMS:  A comprehensive review of systems was negative.   PHYSICAL EXAMINATION: General appearance: alert, cooperative and no distress Head: Normocephalic, without obvious abnormality, atraumatic Neck: no adenopathy Lymph nodes: Cervical, supraclavicular, and axillary nodes normal. Resp: clear to auscultation bilaterally Cardio:  regular rate and rhythm, S1, S2 normal, no murmur, click, rub or gallop GI: soft, non-tender; bowel sounds normal; no masses,  no organomegaly Extremities: extremities normal, atraumatic, no cyanosis or edema  ECOG PERFORMANCE STATUS: 0 - Asymptomatic  Blood pressure 145/73, pulse 61, temperature 97.4 F (36.3 C), temperature source Oral, resp. rate 18, height 5\' 8"  (1.727 m), weight 147 lb 14.4 oz (67.087 kg).  LABORATORY DATA: Lab Results  Component Value Date   WBC 5.3 05/05/2013   HGB 12.3 05/05/2013   HCT 37.0 05/05/2013   MCV 90.1 05/05/2013   PLT 354 05/05/2013      Chemistry      Component Value Date/Time   NA 139 04/30/2013 0430   NA 141 04/05/2013 0754   NA 147* 03/26/2012 1022   K 4.1 04/30/2013 0430   K 4.2  04/05/2013 0754   K 4.8* 03/26/2012 1022   CL 105 04/30/2013 0430   CL 107 04/05/2013 0754   CL 102 03/26/2012 1022   CO2 27 04/30/2013 0430   CO2 28 04/05/2013 0754   CO2 31 03/26/2012 1022   BUN 14 04/30/2013 0430   BUN 15.2 04/05/2013 0754   BUN 14 03/26/2012 1022   CREATININE 0.81 04/30/2013 0430   CREATININE 0.8 04/05/2013 0754   CREATININE 0.7 03/26/2012 1022      Component Value Date/Time   CALCIUM 9.3 04/30/2013 0430   CALCIUM 9.0 04/05/2013 0754   CALCIUM 9.1 03/26/2012 1022   ALKPHOS 73 04/30/2013 0430   ALKPHOS 68 04/05/2013 0754   ALKPHOS 84 03/26/2012 1022   AST 23 04/30/2013 0430   AST 20 04/05/2013 0754   AST 26 03/26/2012 1022   ALT 15 04/30/2013 0430   ALT 16 04/05/2013 0754   BILITOT 0.3 04/30/2013 0430   BILITOT 0.66 04/05/2013 0754   BILITOT 0.70 03/26/2012 1022       RADIOGRAPHIC STUDIES: Ct Head Wo Contrast  04/29/2013   *RADIOLOGY REPORT*  Clinical Data: Weakness.  CT HEAD WITHOUT CONTRAST  Technique:  Contiguous axial images were obtained from the base of the skull through the vertex without contrast.  Comparison: 01/08/2011  Findings: No acute intracranial abnormality.  Specifically, no hemorrhage, hydrocephalus, mass lesion, acute infarction, or significant intracranial injury.  No acute calvarial abnormality. Visualized paranasal sinuses and mastoids clear.  Orbital soft tissues unremarkable.  IMPRESSION: No acute intracranial abnormality.   Original Report Authenticated By: Charlett Nose, M.D.   Mri Brain Without Contrast  04/30/2013   *RADIOLOGY REPORT*  Clinical Data:  New onset left-sided weakness  MRI HEAD WITHOUT CONTRAST MRA HEAD WITHOUT CONTRAST  Technique:  Multiplanar, multiecho pulse sequences of the brain and surrounding structures were obtained without intravenous contrast. Angiographic images of the head were obtained using MRA technique without contrast.  Comparison:  Head CT 04/29/2013.  MRI 06/28/2011.  MRI HEAD  Findings:  There is 1.5 cm acute infarction in the right  occipital lobe.  No other acute infarction.  The brainstem and cerebellum are normal.  The cerebral hemispheres are otherwise normal without evidence of any other old infarction.  No evidence of mass lesion, hemorrhage, hydrocephalus or extra-axial collection.  No pituitary mass.  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  Venous angioma in the left frontal white matter is unchanged without evidence of hemorrhage.  IMPRESSION: 1.5 cm acute infarction in the right occipital lobe.  No other focal brain insult.  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  This should  not be clinically significant.  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  MRA HEAD  Findings: Both internal carotid arteries are widely patent into the brain.  No siphon stenosis.  The anterior and middle cerebral vessels are patent bilaterally.  There are stenoses in several of the branch vessels including the right temporal branch of the MCA and the left A1 segment.  Both vertebral arteries are widely patent to the basilar.  No basilar stenosis.  There are multiple stenoses in the posterior cerebral artery territories, most pronounced at the P1/P2 junction region on the right, consistent with the area of infarction.  IMPRESSION: No major vessel occlusion or correctable proximal stenosis.  Branch vessel stenoses noted in the anterior, middle and posterior cerebral artery territories.  These are most pronounced in both PCAs with the single most severe stenosis being at the right P1/P2 junction, the vessel serving the area of acute infarction.   Original Report Authenticated By: Paulina Fusi, M.D.   Mr Mra Head/brain Wo Cm  04/30/2013   *RADIOLOGY REPORT*  Clinical Data:  New onset left-sided weakness  MRI HEAD WITHOUT CONTRAST MRA HEAD WITHOUT CONTRAST  Technique:  Multiplanar, multiecho pulse sequences of the brain and surrounding structures were obtained without  intravenous contrast. Angiographic images of the head were obtained using MRA technique without contrast.  Comparison:  Head CT 04/29/2013.  MRI 06/28/2011.  MRI HEAD  Findings:  There is 1.5 cm acute infarction in the right occipital lobe.  No other acute infarction.  The brainstem and cerebellum are normal.  The cerebral hemispheres are otherwise normal without evidence of any other old infarction.  No evidence of mass lesion, hemorrhage, hydrocephalus or extra-axial collection.  No pituitary mass.  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  Venous angioma in the left frontal white matter is unchanged without evidence of hemorrhage.  IMPRESSION: 1.5 cm acute infarction in the right occipital lobe.  No other focal brain insult.  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  This should not be clinically significant.  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  MRA HEAD  Findings: Both internal carotid arteries are widely patent into the brain.  No siphon stenosis.  The anterior and middle cerebral vessels are patent bilaterally.  There are stenoses in several of the branch vessels including the right temporal branch of the MCA and the left A1 segment.  Both vertebral arteries are widely patent to the basilar.  No basilar stenosis.  There are multiple stenoses in the posterior cerebral artery territories, most pronounced at the P1/P2 junction region on the right, consistent with the area of infarction.  IMPRESSION: No major vessel occlusion or correctable proximal stenosis.  Branch vessel stenoses noted in the anterior, middle and posterior cerebral artery territories.  These are most pronounced in both PCAs with the single most severe stenosis being at the right P1/P2 junction, the vessel serving the area of acute infarction.   Original Report Authenticated By: Paulina Fusi, M.D.    ASSESSMENT AND PLAN: This is a very pleasant 72  years old white female with metastatic non-small cell lung cancer, adenocarcinoma with positive EGFR mutation exon 21 currently undergoing treatment with Tarceva 150 mg by mouth daily status post 28 months of treatment.  The patient is doing fine and tolerating her treatment fairly well. I recommended for her to proceed with her current treatment with Tarceva. She was also advised to resume her treatment with  Femara. The patient would come back for followup visit in one month with repeat CBC and comprehensive metabolic panel.  All questions were answered. The patient knows to call the clinic with any problems, questions or concerns. We can certainly see the patient much sooner if necessary.

## 2013-05-20 ENCOUNTER — Encounter: Payer: Self-pay | Admitting: Neurology

## 2013-05-20 ENCOUNTER — Other Ambulatory Visit: Payer: Self-pay | Admitting: *Deleted

## 2013-05-20 ENCOUNTER — Telehealth: Payer: Self-pay | Admitting: *Deleted

## 2013-05-20 ENCOUNTER — Ambulatory Visit (INDEPENDENT_AMBULATORY_CARE_PROVIDER_SITE_OTHER): Payer: Medicare Other | Admitting: Neurology

## 2013-05-20 VITALS — BP 119/67 | HR 59 | Ht 68.0 in | Wt 145.0 lb

## 2013-05-20 DIAGNOSIS — F32A Depression, unspecified: Secondary | ICD-10-CM

## 2013-05-20 DIAGNOSIS — F329 Major depressive disorder, single episode, unspecified: Secondary | ICD-10-CM

## 2013-05-20 DIAGNOSIS — I639 Cerebral infarction, unspecified: Secondary | ICD-10-CM

## 2013-05-20 DIAGNOSIS — I4891 Unspecified atrial fibrillation: Secondary | ICD-10-CM

## 2013-05-20 DIAGNOSIS — I6350 Cerebral infarction due to unspecified occlusion or stenosis of unspecified cerebral artery: Secondary | ICD-10-CM

## 2013-05-20 MED ORDER — CLOPIDOGREL BISULFATE 75 MG PO TABS: 75.0000 mg | ORAL_TABLET | Freq: Every day | ORAL | Status: DC

## 2013-05-20 MED ORDER — ESCITALOPRAM OXALATE 10 MG PO TABS: 15.0000 mg | ORAL_TABLET | Freq: Every day | ORAL | Status: DC

## 2013-05-20 NOTE — Progress Notes (Signed)
[facility]  PATIENT: Kristin [name] DOB: [birth year]   HISTORY FROM: patient, chart REASON FOR VISIT: 2 week stroke follow up   HISTORICAL  CHIEF COMPLAINT:  Chief Complaint  Patient presents with  . Cerebrovascular Accident    Np# [id number]    HISTORY OF PRESENT ILLNESS: [name] is a 72 year old right-handed Caucasian female who experienced left arm, and leg weakness,with numbness around her mouth.  Family took her to [facility] from their home in [place], [place].  By the time she arrived at the hospital, most of the symptoms had resolved.  She was admitted for workup.  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  After discharge the next day, she experienced transient left peripheral vision which resolved within hours.  All of her weakness and numbness has resolved.  Patient expresses frustration about short term memory problems since stroke, feeling overwhelmed, and emotional, which is not her normal.   REVIEW OF SYSTEMS: Full 14 system review of systems performed and notable only for:  Constitutional: N/A  Cardiovascular: N/A  Ear/Nose/Throat: N/A  Skin: N/A  Eyes: N/A  Respiratory: N/A  Gastroitestinal: N/A  Hematology/Lymphatic: N/A  Endocrine: N/A Musculoskeletal:N/A  Allergy/Immunology: N/A  Neurological: N/A Psychiatric: N/A   ALLERGIES: Allergies  Allergen Reactions  . Sulfa Antibiotics Rash    HOME MEDICATIONS: Outpatient Prescriptions Prior to Visit  Medication Sig Dispense Refill  . amLODipine (NORVASC) 5 MG tablet Take 5 mg by mouth daily.        Marland Kitchen aspirin 81 MG tablet Take 1 tablet (81 mg total) by mouth daily.  30 tablet  0  . brimonidine-timolol (COMBIGAN) 0.2-0.5 % ophthalmic solution Place 1 drop into the right eye every 12 (twelve) hours.      . Bromfenac Sodium (PROLENSA) 0.07 % SOLN Apply 1 drop to eye at bedtime. In the right eye only      . Calcium Carb-Cholecalciferol (RA CALCIUM 600/VITAMIN D-3) 600-400 MG-UNIT  TABS Take 2 tablets by mouth daily.        . clindamycin (CLEOCIN T) 1 % lotion Apply topically 2 (two) times daily.  60 mL  2  . cycloSPORINE (RESTASIS) 0.05 % ophthalmic emulsion Place 1 drop into both eyes 2 (two) times daily.      Marland Kitchen erlotinib (TARCEVA) 150 MG tablet Take 1 tablet (150 mg total) by mouth daily.  30 tablet  2  . famotidine (PEPCID) 40 MG tablet Take 40 mg by mouth daily.        Marland Kitchen FeFum-FePoly-FA-B Cmp-C-Biot (FOLIVANE-PLUS) CAPS TAKE 1 CAPSULE BY MOUTH DAILY  90 capsule  0  . zolpidem (AMBIEN) 5 MG tablet at bedtime as needed.       . clopidogrel (PLAVIX) 75 MG tablet Take 1 tablet (75 mg total) by mouth daily with breakfast.  30 tablet  0  . escitalopram (LEXAPRO) 10 MG tablet Take 10 mg by mouth daily.         No facility-administered medications prior to visit.    PAST MEDICAL HISTORY: Past Medical History  Diagnosis Date  . Hypertension   . Anemia   . Breast cancer 2002  . Lung cancer dx'd 12/2010  . Stroke     PAST SURGICAL HISTORY: Past Surgical History  Procedure Laterality Date  . Appendectomy    . Mastectomy      bilateral  . Lung surgery    . Cataracts      bilateral  . Colonoscopy  FAMILY HISTORY: Family History  Problem Relation Age of Onset  . Colon cancer Sister   . Lung cancer Father   . Heart disease Mother   . Cancer Paternal Grandmother     SOCIAL HISTORY: History   Social History  . Marital Status: Married    Spouse Name: N/A    Number of Children: N/A  . Years of Education: N/A   Occupational History  . Retired    Social History Main Topics  . Smoking status: Never Smoker   . Smokeless tobacco: Never Used  . Alcohol Use: Yes     Comment: very limited, occasional  . Drug Use: No  . Sexually Active: Not on file   Other Topics Concern  . Not on file   Social History Narrative  . No narrative on file     PHYSICAL EXAM  Filed Vitals:   05/20/13 1257  BP: 119/67  Pulse: 59  Height: 5\' 8"  (1.727 m)    Weight: 145 lb (65.772 kg)   Body mass index is 22.05 kg/(m^2).  Generalized: In no acute distress, pleasant elderly Caucasian female, well developed, well groomed.  Neck: Supple, no carotid bruits   Cardiac: Regular rate rhythm, no murmur   Pulmonary: Clear to auscultation bilaterally   Musculoskeletal: No deformity   Neurological examination   Mentation: Alert oriented to time, place, history taking, language fluent, and casual conversation. MMSE 27/30 with deficits in ATTENTION and CALCULATION. Animal Fluency Testing, 13 (normal).  Clock drawing 4/4. Geriatric Depression Screen 5 (suggests depression).  Cranial nerve II-XII: Pupils were equal round reactive to light extraocular movements were full, visual field were full on confrontational test. facial sensation and strength were normal. hearing was intact to finger rubbing bilaterally. Uvula tongue midline. head turning and shoulder shrug and were normal and symmetric.Tongue protrusion into cheek strength was normal. MOTOR: normal bulk and tone, full strength in the BUE, BLE, fine finger movements normal, no pronator drift SENSORY: normal and symmetric to light touch, pinprick, temperature, vibration and proprioception COORDINATION: finger-nose-finger, heel-to-shin normal bilaterally, there was no truncal ataxia REFLEXES: Brachioradialis 2/2, biceps 2/2, triceps 2/2, patellar 2/2, Achilles 2/2, plantar responses were flexor bilaterally. GAIT/STATION: Rising up from seated position without assistance, normal stance, without trunk ataxia, moderate stride, good arm swing, smooth turning, able to perform tiptoe, and heel walking without difficulty.   DIAGNOSTIC DATA (LABS, IMAGING, TESTING) - I reviewed patient records, labs, notes, testing and imaging myself where available.  Lab Results  Component Value Date   WBC 5.3 05/05/2013   HGB 12.3 05/05/2013   HCT 37.0 05/05/2013   MCV 90.1 05/05/2013   PLT 354 05/05/2013    Lab Results   Component Value Date   CHOL 202* 04/30/2013   HDL 93 04/30/2013   LDLCALC 97 04/30/2013   TRIG 62 04/30/2013   CHOLHDL 2.2 04/30/2013   Lab Results  Component Value Date   HGBA1C 5.2 04/30/2013   04/29/2013  CT HEAD WITHOUT CONTRAST  No acute intracranial abnormality.   04/30/2013  MRI HEAD WITHOUT CONTRAST  1.5 cm acute infarction in the right occipital lobe. No other focal brain insult. 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 This should not be clinically significant. 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   04/30/13   MRA HEAD WITHOUT CONTRAST  No major vessel occlusion or correctable proximal stenosis. Branch vessel stenoses noted in the anterior, middle and posterior cerebral artery territories. These are most pronounced in both PCAs  with the single most severe stenosis being at the right P1/P2 junction, the vessel serving the area of acute infarction.    ASSESSMENT AND PLAN 72 YO female with acute right occipital lobe infarct. History of cancer on Femara and Tarceva. Patient physically back to her baseline at this time, with self-reported short-term memory problems. Etiology of stroke still unclear, need further workup.   MMSE score 27/30, AFT 13, (both normal);  Geriatric Depression Score 5 is suggestive of depression, which is more likely the underlying cause of memory concerns.  PLAN: Continue aspirin 81 mg orally every day and clopidogrel 75 mg orally every day  for secondary stroke prevention and maintain strict control of hypertension with blood pressure goal below 130/90, diabetes with hemoglobin A1c goal below 6.5% and lipids with LDL cholesterol goal below 100 mg/dL.  Stop Aspirin at 3 months. Increase Lexapro to 15mg  daily, or 1.5 tablets. CardioNet or LifeWatch Monitor, 30 day to evaluate for atrial fibrillation. TEE, to evaluate for source of embolus. Return in 2 months.  Orders Placed This Encounter  Procedures  .  Ambulatory referral to Cardiology  . Cardiac event monitor  . Echocardiogram transesophageal   Meds ordered this encounter  Medications  . clopidogrel (PLAVIX) 75 MG tablet    Sig: Take 1 tablet (75 mg total) by mouth daily with breakfast.    Dispense:  90 tablet    Refill:  3    Order Specific Question:  Supervising Provider    Answer:  Micki Riley [[id number]]  . escitalopram (LEXAPRO) 10 MG tablet    Sig: Take 1.5 tablets (15 mg total) by mouth daily.    Dispense:  45 tablet    Refill:  3    Order Specific Question:  Supervising Provider    Answer:  Dallie Piles    LYNN LAM NP-C 05/20/2013, 2:23 PM  [facility] [address] [place], [place] [address] [phone]  I have personally examined this patient, reviewed pertinent data, developed plan of care and discussed with patient and agree with above.  Delia Heady, MD

## 2013-05-20 NOTE — Telephone Encounter (Signed)
[facility] faxed Tarceva 150 mg refill request.  Request to provider for review.

## 2013-05-20 NOTE — Patient Instructions (Addendum)
Continue aspirin 81 mg orally every day and clopidogrel 75 mg orally every day  for secondary stroke prevention and maintain strict control of hypertension with blood pressure goal below 130/90, diabetes with hemoglobin A1c goal below 6.5% and lipids with LDL cholesterol goal below 100 mg/dL.   You need heart monitoring to look for an irregular heart beat (atrial fibrillation) as cause of your stroke. [facility] has been consulted to provide this service. Windell Moulding from [facility] will call you to make arrangements.  Your testing in the office suggests depression, we would like you to start taking 15mg  a day of your lexapro, or 1.5 tablets. Stop Aspirin after 3 months. Referral to Cardiology, office will call you to schedule:  CardioNet or LifeWatch Monitor, 30 day to evaluate for atrial fibrillation. TEE, to evaluate for source of embolus. Follow up in 2 months.  STROKE/TIA INSTRUCTIONS SMOKING Cigarette smoking nearly doubles your risk of having a stroke & is the single most alterable risk factor  If you smoke or have smoked in the last 12 months, you are advised to quit smoking for your health.  Most of the excess cardiovascular risk related to smoking disappears within a year of stopping.  Ask you doctor about anti-smoking medications  [facility] Quit Line: 1-800-QUIT NOW  Free Smoking Cessation Classes ([phone]  CHOLESTEROL Know your levels; limit fat & cholesterol in your diet  Lab Results  Component Value Date   CHOL 202* 04/30/2013   HDL 93 04/30/2013   LDLCALC 97 04/30/2013   TRIG 62 04/30/2013   CHOLHDL 2.2 04/30/2013      Many patients benefit from treatment even if their cholesterol is at goal.  Goal: Total Cholesterol less than 160  Goal:  LDL less than 100  Goal:  HDL greater than 40  Goal:  Triglycerides less than 150  BLOOD PRESSURE American Stroke Association blood pressure target is less that 120/80 mm/Hg  Your discharge blood pressure is:  BP: 119/67 mmHg   Monitor your blood pressure  Limit your salt and alcohol intake  Many individuals will require more than one medication for high blood pressure  DIABETES (A1c is a blood sugar average for last 3 months) Goal A1c is under 7% (A1c is blood sugar average for last 3 months)  Diabetes: No known diagnosis of diabetes    Lab Results  Component Value Date   HGBA1C 5.2 04/30/2013    Your A1c can be lowered with medications, healthy diet, and exercise.  Check your blood sugar as directed by your physician  Call your physician if you experience unexplained or low blood sugars.  PHYSICAL ACTIVITY/REHABILITATION Goal is 30 minutes at least 4 days per week    Activity decreases your risk of heart attack and stroke and makes your heart stronger.  It helps control your weight and blood pressure; helps you relax and can improve your mood.  Participate in a regular exercise program.  Talk with your doctor about the best form of exercise for you (dancing, walking, swimming, cycling).  DIET/WEIGHT Goal is to maintain a healthy weight  Your height is:  Height: 5\' 8"  (172.7 cm) Your current weight is: Weight: 145 lb (65.772 kg) Your body Mass Index (BMI) is:  BMI (Calculated): 22.1  Following the type of diet specifically designed for you will help prevent another stroke.  Your goal Body Mass Index (BMI) is 19-24.  Healthy food habits can help reduce 3 risk factors for stroke:  High cholesterol, hypertension, and excess weight.

## 2013-05-21 ENCOUNTER — Other Ambulatory Visit: Payer: Self-pay | Admitting: *Deleted

## 2013-05-21 MED ORDER — ERLOTINIB HCL 150 MG PO TABS: 150.0000 mg | ORAL_TABLET | Freq: Every day | ORAL | Status: DC

## 2013-05-24 ENCOUNTER — Other Ambulatory Visit: Payer: Self-pay | Admitting: Nurse Practitioner

## 2013-05-24 DIAGNOSIS — I499 Cardiac arrhythmia, unspecified: Secondary | ICD-10-CM

## 2013-05-24 DIAGNOSIS — I4891 Unspecified atrial fibrillation: Secondary | ICD-10-CM

## 2013-05-24 DIAGNOSIS — Q211 Atrial septal defect: Secondary | ICD-10-CM

## 2013-05-24 DIAGNOSIS — I639 Cerebral infarction, unspecified: Secondary | ICD-10-CM

## 2013-05-24 DIAGNOSIS — Q2112 Patent foramen ovale: Secondary | ICD-10-CM

## 2013-05-27 NOTE — Addendum Note (Signed)
Addended byHermenia Fiscal on: 05/27/2013 09:30 AM   Modules accepted: Orders

## 2013-05-27 NOTE — Addendum Note (Signed)
Addended byHermenia Fiscal on: 05/27/2013 09:23 AM   Modules accepted: Orders

## 2013-05-28 ENCOUNTER — Other Ambulatory Visit: Payer: Self-pay | Admitting: *Deleted

## 2013-05-28 ENCOUNTER — Telehealth: Payer: Self-pay

## 2013-05-28 ENCOUNTER — Other Ambulatory Visit: Payer: Self-pay

## 2013-05-28 DIAGNOSIS — I639 Cerebral infarction, unspecified: Secondary | ICD-10-CM

## 2013-05-28 MED ORDER — FOLIVANE-PLUS PO CAPS: ORAL_CAPSULE | ORAL | Status: DC

## 2013-05-28 NOTE — Telephone Encounter (Signed)
**Note De-Identified  Obfuscation** Received a call from [place] at [facility] asking [place] to order a TEE on this pt for CVA. TEE scheduled for 06/04/13 at 12 pm with Dr Gala Romney (conformation # [phone], Wille Celeste). Pt aware and agrees with date and time of TEE. I went over all instructions for TEE with pt over the phone, she repeated instructions back to me and verbalized understanding.

## 2013-06-01 ENCOUNTER — Encounter (INDEPENDENT_AMBULATORY_CARE_PROVIDER_SITE_OTHER): Payer: Medicare Other

## 2013-06-01 ENCOUNTER — Telehealth: Payer: Self-pay | Admitting: *Deleted

## 2013-06-01 DIAGNOSIS — I639 Cerebral infarction, unspecified: Secondary | ICD-10-CM

## 2013-06-01 DIAGNOSIS — I499 Cardiac arrhythmia, unspecified: Secondary | ICD-10-CM

## 2013-06-01 DIAGNOSIS — I4891 Unspecified atrial fibrillation: Secondary | ICD-10-CM

## 2013-06-01 NOTE — Addendum Note (Signed)
Addended byHermenia Fiscal on: 06/01/2013 08:54 AM   Modules accepted: Orders

## 2013-06-01 NOTE — Telephone Encounter (Signed)
30 day event monitor placed on Pt 06/01/13 TK

## 2013-06-03 ENCOUNTER — Ambulatory Visit (HOSPITAL_BASED_OUTPATIENT_CLINIC_OR_DEPARTMENT_OTHER): Payer: Medicare Other | Admitting: Internal Medicine

## 2013-06-03 ENCOUNTER — Encounter: Payer: Self-pay | Admitting: Internal Medicine

## 2013-06-03 ENCOUNTER — Telehealth: Payer: Self-pay | Admitting: Internal Medicine

## 2013-06-03 ENCOUNTER — Other Ambulatory Visit (HOSPITAL_BASED_OUTPATIENT_CLINIC_OR_DEPARTMENT_OTHER): Payer: Medicare Other | Admitting: Lab

## 2013-06-03 DIAGNOSIS — C3410 Malignant neoplasm of upper lobe, unspecified bronchus or lung: Secondary | ICD-10-CM

## 2013-06-03 DIAGNOSIS — Z853 Personal history of malignant neoplasm of breast: Secondary | ICD-10-CM

## 2013-06-03 LAB — COMPREHENSIVE METABOLIC PANEL (CC13)
ALT: 13 U/L (ref 0–55)
AST: 17 U/L (ref 5–34)
Albumin: 3 g/dL — ABNORMAL LOW (ref 3.5–5.0)
BUN: 15.6 mg/dL (ref 7.0–26.0)
Calcium: 9.1 mg/dL (ref 8.4–10.4)
Chloride: 107 mEq/L (ref 98–109)
Potassium: 4.4 mEq/L (ref 3.5–5.1)
Sodium: 140 mEq/L (ref 136–145)
Total Protein: 5.9 g/dL — ABNORMAL LOW (ref 6.4–8.3)

## 2013-06-03 LAB — CBC WITH DIFFERENTIAL/PLATELET
BASO%: 0.9 % (ref 0.0–2.0)
Basophils Absolute: 0.1 10*3/uL (ref 0.0–0.1)
EOS%: 6.8 % (ref 0.0–7.0)
HGB: 11.4 g/dL — ABNORMAL LOW (ref 11.6–15.9)
MCH: 29.2 pg (ref 25.1–34.0)
NEUT#: 4 10*3/uL (ref 1.5–6.5)
RDW: 13.7 % (ref 11.2–14.5)
lymph#: 1.2 10*3/uL (ref 0.9–3.3)

## 2013-06-03 NOTE — Telephone Encounter (Signed)
gv and printed appt sched and avs for pt  °

## 2013-06-03 NOTE — Progress Notes (Signed)
[facility] Telephone:(336) [phone]   Fax:(336) [phone]  OFFICE PROGRESS NOTE  [name], MD [address] [place] [address]  Principle Diagnosis:  1) Metastatic non-small cell lung cancer adenocarcinoma with positive for EGFR mutation at exon 21 diagnosed in March of 2012.  2) history of breast cancer.   Prior Therapy: None.   Current therapy:  1) Tarceva at 150 mg by mouth daily status post 29 months therapy.  2) Femara 2.5 mg by mouth daily.  CHEMOTHERAPY INTENT: palliative  CURRENT # OF CHEMOTHERAPY CYCLES: 29  CURRENT ANTIEMETICS: Compazine when necessary  CURRENT SMOKING STATUS: Never smoker  ORAL CHEMOTHERAPY AND CONSENT: Tarceva and Femara.  CURRENT BISPHOSPHONATES USE: None  PAIN MANAGEMENT: None  NARCOTICS INDUCED CONSTIPATION: None  LIVING WILL AND CODE STATUS: Full code initially but no prolonged resuscitation.  INTERVAL HISTORY: Kristin [name] 72 y.o. female returns to the clinic today for monthly follow up visit. The patient is feeling fine today with no specific complaints. She denied having any significant chest pain, shortness breath, cough or hemoptysis. The patient denied having any weight loss or night sweats. She has no nausea or vomiting. She is tolerating her treatment with oral Tarceva fairly well with no significant adverse effects.  MEDICAL HISTORY: Past Medical History  Diagnosis Date  . Hypertension   . Anemia   . Breast cancer 2002  . Lung cancer dx'd 12/2010  . Stroke     ALLERGIES:  is allergic to sulfa antibiotics.  MEDICATIONS:  Current Outpatient Prescriptions  Medication Sig Dispense Refill  . amLODipine (NORVASC) 5 MG tablet Take 5 mg by mouth daily.        Marland Kitchen aspirin 81 MG tablet Take 1 tablet (81 mg total) by mouth daily.  30 tablet  0  . brimonidine-timolol (COMBIGAN) 0.2-0.5 % ophthalmic solution Place 1 drop into the right eye every 12 (twelve) hours.      . Bromfenac Sodium (PROLENSA)  0.07 % SOLN Apply 1 drop to eye at bedtime. In the right eye only      . Calcium Carb-Cholecalciferol (RA CALCIUM 600/VITAMIN D-3) 600-400 MG-UNIT TABS Take 2 tablets by mouth daily.        . clindamycin (CLEOCIN T) 1 % lotion Apply topically 2 (two) times daily.  60 mL  2  . clopidogrel (PLAVIX) 75 MG tablet Take 1 tablet (75 mg total) by mouth daily with breakfast.  90 tablet  3  . cycloSPORINE (RESTASIS) 0.05 % ophthalmic emulsion Place 1 drop into both eyes 2 (two) times daily.      Marland Kitchen erlotinib (TARCEVA) 150 MG tablet Take 1 tablet (150 mg total) by mouth daily.  30 tablet  2  . escitalopram (LEXAPRO) 10 MG tablet Take 1.5 tablets (15 mg total) by mouth daily.  45 tablet  3  . famotidine (PEPCID) 40 MG tablet Take 40 mg by mouth daily.        Marland Kitchen FeFum-FePoly-FA-B Cmp-C-Biot (FOLIVANE-PLUS) CAPS TAKE 1 CAPSULE BY MOUTH DAILY  90 capsule  1  . letrozole (FEMARA) 2.5 MG tablet       . lisinopril (PRINIVIL,ZESTRIL) 10 MG tablet 20 mg.       . zolpidem (AMBIEN) 5 MG tablet at bedtime as needed.        No current facility-administered medications for this visit.    SURGICAL HISTORY:  Past Surgical History  Procedure Laterality Date  . Appendectomy    . Mastectomy      bilateral  .  Lung surgery    . Cataracts      bilateral  . Colonoscopy      REVIEW OF SYSTEMS:  A comprehensive review of systems was negative.   PHYSICAL EXAMINATION: General appearance: alert, cooperative and no distress Head: Normocephalic, without obvious abnormality, atraumatic Neck: no adenopathy Lymph nodes: Cervical, supraclavicular, and axillary nodes normal. Resp: clear to auscultation bilaterally Cardio: regular rate and rhythm, S1, S2 normal, no murmur, click, rub or gallop GI: soft, non-tender; bowel sounds normal; no masses,  no organomegaly Extremities: extremities normal, atraumatic, no cyanosis or edema  ECOG PERFORMANCE STATUS: 1 - Symptomatic but completely ambulatory  Blood pressure 120/50,  pulse 52, temperature 97.9 F (36.6 C), temperature source Oral, resp. rate 18, height 5\' 8"  (1.727 m), weight 146 lb 3.2 oz (66.316 kg), SpO2 98.00%.  LABORATORY DATA: Lab Results  Component Value Date   WBC 6.5 06/03/2013   HGB 11.4* 06/03/2013   HCT 35.5 06/03/2013   MCV 91.0 06/03/2013   PLT 385 06/03/2013      Chemistry      Component Value Date/Time   NA 140 06/03/2013 0921   NA 139 04/30/2013 0430   NA 147* 03/26/2012 1022   K 4.4 06/03/2013 0921   K 4.1 04/30/2013 0430   K 4.8* 03/26/2012 1022   CL 105 04/30/2013 0430   CL 107 04/05/2013 0754   CL 102 03/26/2012 1022   CO2 25 06/03/2013 0921   CO2 27 04/30/2013 0430   CO2 31 03/26/2012 1022   BUN 15.6 06/03/2013 0921   BUN 14 04/30/2013 0430   BUN 14 03/26/2012 1022   CREATININE 1.0 06/03/2013 0921   CREATININE 0.81 04/30/2013 0430   CREATININE 0.7 03/26/2012 1022      Component Value Date/Time   CALCIUM 9.1 06/03/2013 0921   CALCIUM 9.3 04/30/2013 0430   CALCIUM 9.1 03/26/2012 1022   ALKPHOS 76 06/03/2013 0921   ALKPHOS 73 04/30/2013 0430   ALKPHOS 84 03/26/2012 1022   AST 17 06/03/2013 0921   AST 23 04/30/2013 0430   AST 26 03/26/2012 1022   ALT 13 06/03/2013 0921   ALT 15 04/30/2013 0430   ALT 20 03/26/2012 1022   BILITOT 0.70 06/03/2013 0921   BILITOT 0.3 04/30/2013 0430   BILITOT 0.70 03/26/2012 1022       RADIOGRAPHIC STUDIES: No results found.  ASSESSMENT AND PLAN: this is a very pleasant 72 years old white female with metastatic non-small cell lung cancer currently on treatment with Tarceva 150 mg daily for the last 29 months with no evidence for disease progression. I recommended for the patient to continue her current treatment with Tarceva with the same dose. For the history of breast cancer, the patient will continue on femora 2.5 mg by mouth daily. She would come back for follow up visit in one month with repeat CT scan of the chest for restaging of her disease.  The patient voices understanding of current disease status and  treatment options and is in agreement with the current care plan.  All questions were answered. The patient knows to call the clinic with any problems, questions or concerns. We can certainly see the patient much sooner if necessary.

## 2013-06-04 ENCOUNTER — Encounter (HOSPITAL_COMMUNITY)
Admission: RE | Disposition: A | Discharge status: Home / Self Care | Payer: Self-pay | Source: Ambulatory Visit | Attending: Internal Medicine

## 2013-06-04 ENCOUNTER — Ambulatory Visit (HOSPITAL_COMMUNITY)
Admission: RE | Admit: 2013-06-04 | Discharge: 2013-06-04 | Disposition: A | Discharge status: Home / Self Care | Payer: Medicare Other | Source: Ambulatory Visit | Attending: Internal Medicine | Admitting: Internal Medicine

## 2013-06-04 ENCOUNTER — Encounter (HOSPITAL_COMMUNITY): Payer: Self-pay

## 2013-06-04 DIAGNOSIS — I6789 Other cerebrovascular disease: Secondary | ICD-10-CM

## 2013-06-04 DIAGNOSIS — I639 Cerebral infarction, unspecified: Secondary | ICD-10-CM

## 2013-06-04 DIAGNOSIS — I700 Atherosclerosis of aorta: Secondary | ICD-10-CM | POA: Insufficient documentation

## 2013-06-04 DIAGNOSIS — Z8673 Personal history of transient ischemic attack (TIA), and cerebral infarction without residual deficits: Secondary | ICD-10-CM | POA: Insufficient documentation

## 2013-06-04 HISTORY — PX: TEE WITHOUT CARDIOVERSION: SHX5443

## 2013-06-04 SURGERY — ECHOCARDIOGRAM, TRANSESOPHAGEAL
Anesthesia: Moderate Sedation

## 2013-06-04 MED ORDER — SODIUM CHLORIDE 0.9 % IV SOLN
INTRAVENOUS | Status: DC
  Administered 2013-06-04: 500 mL via INTRAVENOUS

## 2013-06-04 MED ORDER — MIDAZOLAM HCL 10 MG/2ML IJ SOLN
INTRAMUSCULAR | Status: DC | PRN
  Administered 2013-06-04: 2 mg via INTRAVENOUS
  Administered 2013-06-04: 1 mg via INTRAVENOUS
  Administered 2013-06-04: 2 mg via INTRAVENOUS

## 2013-06-04 MED ORDER — FENTANYL CITRATE 0.05 MG/ML IJ SOLN
INTRAMUSCULAR | Status: DC | PRN
  Administered 2013-06-04 (×2): 25 ug via INTRAVENOUS

## 2013-06-04 MED ORDER — FENTANYL CITRATE 0.05 MG/ML IJ SOLN
INTRAMUSCULAR | Status: AC
  Filled 2013-06-04: qty 4

## 2013-06-04 MED ORDER — MIDAZOLAM HCL 5 MG/ML IJ SOLN
INTRAMUSCULAR | Status: AC
  Filled 2013-06-04: qty 2

## 2013-06-04 MED ORDER — DIPHENHYDRAMINE HCL 50 MG/ML IJ SOLN
INTRAMUSCULAR | Status: AC
  Filled 2013-06-04: qty 1

## 2013-06-04 MED ORDER — LIDOCAINE VISCOUS 2 % MT SOLN
OROMUCOSAL | Status: DC | PRN
  Administered 2013-06-04: 10 mL via OROMUCOSAL

## 2013-06-04 MED ORDER — LIDOCAINE VISCOUS 2 % MT SOLN
OROMUCOSAL | Status: AC
  Filled 2013-06-04: qty 15

## 2013-06-04 NOTE — Progress Notes (Signed)
  Echocardiogram Echocardiogram Transesophageal has been performed.  Kristin [name] 06/04/2013, 1:00 PM

## 2013-06-05 NOTE — Patient Instructions (Signed)
Current therapy:  1) Tarceva at 150 mg by mouth daily status post 29 months therapy.  2) Femara 2.5 mg by mouth daily.  CHEMOTHERAPY INTENT: palliative  CURRENT # OF CHEMOTHERAPY CYCLES: 29  CURRENT ANTIEMETICS: Compazine when necessary  CURRENT SMOKING STATUS: Never smoker  ORAL CHEMOTHERAPY AND CONSENT: Tarceva and Femara. CURRENT BISPHOSPHONATES USE: None  PAIN MANAGEMENT: None  NARCOTICS INDUCED CONSTIPATION: None  LIVING WILL AND CODE STATUS: Full code initially but no prolonged resuscitation.

## 2013-06-07 ENCOUNTER — Encounter (HOSPITAL_COMMUNITY): Payer: Self-pay | Admitting: Internal Medicine

## 2013-06-25 ENCOUNTER — Ambulatory Visit (INDEPENDENT_AMBULATORY_CARE_PROVIDER_SITE_OTHER): Payer: Medicare Other | Admitting: Ophthalmology

## 2013-06-25 DIAGNOSIS — H35039 Hypertensive retinopathy, unspecified eye: Secondary | ICD-10-CM

## 2013-06-25 DIAGNOSIS — I10 Essential (primary) hypertension: Secondary | ICD-10-CM

## 2013-06-25 DIAGNOSIS — H35359 Cystoid macular degeneration, unspecified eye: Secondary | ICD-10-CM

## 2013-06-25 DIAGNOSIS — H35379 Puckering of macula, unspecified eye: Secondary | ICD-10-CM

## 2013-06-25 DIAGNOSIS — D3130 Benign neoplasm of unspecified choroid: Secondary | ICD-10-CM

## 2013-07-06 ENCOUNTER — Other Ambulatory Visit: Payer: Self-pay | Admitting: Internal Medicine

## 2013-07-06 ENCOUNTER — Other Ambulatory Visit (HOSPITAL_BASED_OUTPATIENT_CLINIC_OR_DEPARTMENT_OTHER): Payer: Medicare Other | Admitting: Lab

## 2013-07-06 ENCOUNTER — Ambulatory Visit (HOSPITAL_COMMUNITY)
Admission: RE | Admit: 2013-07-06 | Discharge: 2013-07-06 | Disposition: A | Discharge status: Home / Self Care | Payer: Medicare Other | Source: Ambulatory Visit | Attending: Internal Medicine | Admitting: Internal Medicine

## 2013-07-06 ENCOUNTER — Other Ambulatory Visit: Payer: Medicare Other | Admitting: Lab

## 2013-07-06 DIAGNOSIS — C3490 Malignant neoplasm of unspecified part of unspecified bronchus or lung: Secondary | ICD-10-CM | POA: Insufficient documentation

## 2013-07-06 DIAGNOSIS — K449 Diaphragmatic hernia without obstruction or gangrene: Secondary | ICD-10-CM | POA: Insufficient documentation

## 2013-07-06 DIAGNOSIS — K7689 Other specified diseases of liver: Secondary | ICD-10-CM | POA: Insufficient documentation

## 2013-07-06 DIAGNOSIS — C3410 Malignant neoplasm of upper lobe, unspecified bronchus or lung: Secondary | ICD-10-CM

## 2013-07-06 LAB — CBC WITH DIFFERENTIAL/PLATELET
Eosinophils Absolute: 0.3 10*3/uL (ref 0.0–0.5)
LYMPH%: 17.1 % (ref 14.0–49.7)
MONO#: 0.9 10*3/uL (ref 0.1–0.9)
NEUT#: 4.9 10*3/uL (ref 1.5–6.5)
Platelets: 407 10*3/uL — ABNORMAL HIGH (ref 145–400)
RBC: 3.47 10*6/uL — ABNORMAL LOW (ref 3.70–5.45)
RDW: 13.5 % (ref 11.2–14.5)
WBC: 7.5 10*3/uL (ref 3.9–10.3)

## 2013-07-06 LAB — COMPREHENSIVE METABOLIC PANEL (CC13)
ALT: 10 U/L (ref 0–55)
AST: 16 U/L (ref 5–34)
Alkaline Phosphatase: 75 U/L (ref 40–150)
BUN: 15.6 mg/dL (ref 7.0–26.0)
CO2: 29 mEq/L (ref 22–29)
Chloride: 108 mEq/L (ref 98–109)
Creatinine: 0.8 mg/dL (ref 0.6–1.1)
Glucose: 101 mg/dl (ref 70–140)
Potassium: 4.4 mEq/L (ref 3.5–5.1)
Sodium: 141 mEq/L (ref 136–145)
Total Bilirubin: 0.66 mg/dL (ref 0.20–1.20)

## 2013-07-07 ENCOUNTER — Other Ambulatory Visit: Payer: Self-pay | Admitting: *Deleted

## 2013-07-07 DIAGNOSIS — Z853 Personal history of malignant neoplasm of breast: Secondary | ICD-10-CM

## 2013-07-07 MED ORDER — LETROZOLE 2.5 MG PO TABS: 2.5000 mg | ORAL_TABLET | Freq: Every day | ORAL | Status: DC

## 2013-07-08 ENCOUNTER — Encounter: Payer: Self-pay | Admitting: Internal Medicine

## 2013-07-08 ENCOUNTER — Telehealth: Payer: Self-pay | Admitting: Internal Medicine

## 2013-07-08 ENCOUNTER — Ambulatory Visit (HOSPITAL_BASED_OUTPATIENT_CLINIC_OR_DEPARTMENT_OTHER): Payer: Medicare Other | Admitting: Internal Medicine

## 2013-07-08 DIAGNOSIS — C3410 Malignant neoplasm of upper lobe, unspecified bronchus or lung: Secondary | ICD-10-CM

## 2013-07-08 DIAGNOSIS — R21 Rash and other nonspecific skin eruption: Secondary | ICD-10-CM

## 2013-07-08 DIAGNOSIS — Z853 Personal history of malignant neoplasm of breast: Secondary | ICD-10-CM

## 2013-07-08 MED ORDER — DOXYCYCLINE HYCLATE 100 MG PO TABS: 100.0000 mg | ORAL_TABLET | Freq: Two times a day (BID) | ORAL | Status: DC

## 2013-07-08 MED ORDER — METHYLPREDNISOLONE (PAK) 4 MG PO TABS: ORAL_TABLET | ORAL | Status: DC

## 2013-07-08 NOTE — Progress Notes (Signed)
[facility] [phone]   Fax:(336) [phone]  OFFICE PROGRESS NOTE  Kristin Nations, MD [address] 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  2) history of breast cancer.   Prior Therapy: None.   Current therapy:  1) Tarceva at 150 mg by mouth daily status post 30 months therapy.  2) Femara 2.5 mg by mouth daily.   CHEMOTHERAPY INTENT: palliative  CURRENT # OF CHEMOTHERAPY CYCLES: 30  CURRENT ANTIEMETICS: Compazine when necessary  CURRENT SMOKING STATUS: Never smoker  ORAL CHEMOTHERAPY AND CONSENT: Tarceva and Femara.  CURRENT BISPHOSPHONATES USE: None  PAIN MANAGEMENT: None  NARCOTICS INDUCED CONSTIPATION: None  LIVING WILL AND CODE STATUS: Full code initially but no prolonged resuscitation.   INTERVAL HISTORY: Kristin [name] 72 y.o. female returns to the clinic today for follow up visit accompanied by her daughter-in-law. The patient is feeling fine today with no specific complaints except for mild fatigue and skin rash mainly on the face chest and lower extremities. She denied having any significant diarrhea. The patient denied having any chest pain, shortness of breath, cough or hemoptysis. She is rating her treatment with Tarceva and Femara fairly well except for the skin rash. The patient had repeat CT scan of the chest performed recently and she is here for evaluation and discussion of her scan results.  MEDICAL HISTORY: Past Medical History  Diagnosis Date  . Hypertension   . Anemia   . Breast cancer 2002  . Lung cancer dx'd 12/2010  . Stroke     ALLERGIES:  is allergic to sulfa antibiotics.  MEDICATIONS:  Current Outpatient Prescriptions  Medication Sig Dispense Refill  . amLODipine (NORVASC) 5 MG tablet Take 5 mg by mouth daily.        Marland Kitchen aspirin 81 MG tablet Take 1 tablet (81 mg total) by mouth  daily.  30 tablet  0  . brimonidine-timolol (COMBIGAN) 0.2-0.5 % ophthalmic solution Place 1 drop into the right eye every 12 (twelve) hours.      . Bromfenac Sodium (PROLENSA) 0.07 % SOLN Apply 1 drop to eye at bedtime. In the right eye only      . Calcium Carb-Cholecalciferol (RA CALCIUM 600/VITAMIN D-3) 600-400 MG-UNIT TABS Take 2 tablets by mouth daily.        . clopidogrel (PLAVIX) 75 MG tablet Take 1 tablet (75 mg total) by mouth daily with breakfast.  90 tablet  3  . cycloSPORINE (RESTASIS) 0.05 % ophthalmic emulsion Place 1 drop into both eyes 2 (two) times daily.      Marland Kitchen erlotinib (TARCEVA) 150 MG tablet Take 1 tablet (150 mg total) by mouth daily.  30 tablet  2  . escitalopram (LEXAPRO) 10 MG tablet Take 1.5 tablets (15 mg total) by mouth daily.  45 tablet  3  . famotidine (PEPCID) 40 MG tablet Take 40 mg by mouth daily.        Marland Kitchen FeFum-FePoly-FA-B Cmp-C-Biot (FOLIVANE-PLUS) CAPS TAKE 1 CAPSULE BY MOUTH DAILY  90 capsule  1  . letrozole (FEMARA) 2.5 MG tablet Take 1 tablet (2.5 mg total) by mouth daily.  30 tablet  0  . lisinopril (PRINIVIL,ZESTRIL) 10 MG tablet 20 mg.       . zolpidem (AMBIEN) 5 MG tablet at bedtime as needed.       . clindamycin (CLEOCIN T) 1 % lotion Apply topically 2 (  two) times daily.  60 mL  2   No current facility-administered medications for this visit.    SURGICAL HISTORY:  Past Surgical History  Procedure Laterality Date  . Appendectomy    . Mastectomy      bilateral  . Lung surgery    . Cataracts      bilateral  . Colonoscopy    . Tonsillectomy    . Tee without cardioversion N/A 06/04/2013    Procedure: TRANSESOPHAGEAL ECHOCARDIOGRAM (TEE);  Surgeon: Dolores Patty, MD;  Location: [facility] ENDOS[facility];  Service: Cardiovascular;  Laterality: N/A;    REVIEW OF SYSTEMS:  Constitutional: negative Eyes: negative Ears, nose, mouth, throat, and face: negative Respiratory: negative Cardiovascular: negative Gastrointestinal:  negative Genitourinary:negative Integument/breast: positive for rash Hematologic/lymphatic: negative Musculoskeletal:negative Neurological: negative Behavioral/Psych: negative Endocrine: negative Allergic/Immunologic: negative   PHYSICAL EXAMINATION: General appearance: alert, cooperative and no distress Head: Normocephalic, without obvious abnormality, atraumatic Neck: no adenopathy, no JVD and thyroid not enlarged, symmetric, no tenderness/mass/nodules Lymph nodes: Cervical, supraclavicular, and axillary nodes normal. Resp: clear to auscultation bilaterally and normal percussion bilaterally Back: symmetric, no curvature. ROM normal. No CVA tenderness. Cardio: regular rate and rhythm, S1, S2 normal, no murmur, click, rub or gallop GI: soft, non-tender; bowel sounds normal; no masses,  no organomegaly Extremities: extremities normal, atraumatic, no cyanosis or edema and Grade 2 skin rash on the lower extremities Neurologic: Alert and oriented X 3, normal strength and tone. Normal symmetric reflexes. Normal coordination and gait  ECOG PERFORMANCE STATUS: 1 - Symptomatic but completely ambulatory  Blood pressure 126/90, pulse 59, temperature 97.8 F (36.6 C), temperature source Oral, resp. rate 18, height 5\' 8"  (1.727 m), weight 145 lb 9.6 oz (66.044 kg).  LABORATORY DATA: Lab Results  Component Value Date   WBC 7.5 07/06/2013   HGB 10.5* 07/06/2013   HCT 31.3* 07/06/2013   MCV 90.2 07/06/2013   PLT 407* 07/06/2013      Chemistry      Component Value Date/Time   NA 141 07/06/2013 1228   NA 139 04/30/2013 0430   NA 147* 03/26/2012 1022   K 4.4 07/06/2013 1228   K 4.1 04/30/2013 0430   K 4.8* 03/26/2012 1022   CL 105 04/30/2013 0430   CL 107 04/05/2013 0754   CL 102 03/26/2012 1022   CO2 29 07/06/2013 1228   CO2 27 04/30/2013 0430   CO2 31 03/26/2012 1022   BUN 15.6 07/06/2013 1228   BUN 14 04/30/2013 0430   BUN 14 03/26/2012 1022   CREATININE 0.8 07/06/2013 1228   CREATININE 0.81 04/30/2013  0430   CREATININE 0.7 03/26/2012 1022      Component Value Date/Time   CALCIUM 9.0 07/06/2013 1228   CALCIUM 9.3 04/30/2013 0430   CALCIUM 9.1 03/26/2012 1022   ALKPHOS 75 07/06/2013 1228   ALKPHOS 73 04/30/2013 0430   ALKPHOS 84 03/26/2012 1022   AST 16 07/06/2013 1228   AST 23 04/30/2013 0430   AST 26 03/26/2012 1022   ALT 10 07/06/2013 1228   ALT 15 04/30/2013 0430   ALT 20 03/26/2012 1022   BILITOT 0.66 07/06/2013 1228   BILITOT 0.3 04/30/2013 0430   BILITOT 0.70 03/26/2012 1022       RADIOGRAPHIC STUDIES: Ct Chest Wo Contrast  07/06/2013   CLINICAL DATA:  Lung cancer.  EXAM: CT CHEST WITHOUT CONTRAST  TECHNIQUE: Multidetector CT imaging of the chest was performed following the standard protocol without IV contrast.  COMPARISON:  04/05/2013.  FINDINGS: The chest  wall is unremarkable and stable. No supraclavicular or axillary mass or adenopathy. The bony thorax is intact. No destructive bone lesions or spinal canal compromise.  The heart is normal in size. No pericardial effusion. No mediastinal or hilar mass or adenopathy. The aorta is normal in caliber. Minimal atherosclerotic calcifications. The esophagus is normal except for a stable large hiatal hernia.  Examination of the lung parenchyma demonstrates stable left upper lobe scarring changes. No CT findings for return tumor or pulmonary metastatic disease. Mild hyperinflation is again demonstrated. No interstitial lung disease or bronchiectasis. No pleural effusion.  The upper abdomen is unremarkable. Stable hepatic cyst.  IMPRESSION: Stable CT appearance of the chest. No findings for recurrent tumor or metastatic disease.   Electronically Signed   By: Loralie Champagne M.D.   On: 07/06/2013 14:25    ASSESSMENT AND PLAN: this is a very pleasant 72 years old white female with history of metastatic non-small cell lung cancer, adenocarcinoma currently on treatment with Tarceva for the last 30 months with no evidence for disease progression. I discussed the  scan results with the patient and her daughter-in-law today. I recommended for her to continue treatment with Tarceva with the same dose. For the skin rash, I will start the patient on doxycycline 100 mg by mouth twice a day for the next 10 days and she was advised to apply clindamycin lotion 1% to the skin rash area. I will also start the patient on Medrol Dosepak. For the history of the breast cancer, the patient will continue on Femara 2.5 mg by mouth daily. She would come back for follow up visit in one month's for reevaluation.  The patient voices understanding of current disease status and treatment options and is in agreement with the current care plan.  All questions were answered. The patient knows to call the clinic with any problems, questions or concerns. We can certainly see the patient much sooner if necessary.  I spent 15 minutes counseling the patient face to face. The total time spent in the appointment was 25 minutes.

## 2013-07-08 NOTE — Telephone Encounter (Signed)
, °

## 2013-07-10 NOTE — Patient Instructions (Signed)
Current therapy:  1) Tarceva at 150 mg by mouth daily status post 30 months therapy.  2) Femara 2.5 mg by mouth daily.  CHEMOTHERAPY INTENT: palliative  CURRENT # OF CHEMOTHERAPY CYCLES: 30  CURRENT ANTIEMETICS: Compazine when necessary  CURRENT SMOKING STATUS: Never smoker  ORAL CHEMOTHERAPY AND CONSENT: Tarceva and Femara.  CURRENT BISPHOSPHONATES USE: None  PAIN MANAGEMENT: None  NARCOTICS INDUCED CONSTIPATION: None  LIVING WILL AND CODE STATUS: Full code initially but no prolonged resuscitation.

## 2013-07-20 ENCOUNTER — Encounter: Payer: Self-pay | Admitting: Nurse Practitioner

## 2013-07-20 ENCOUNTER — Ambulatory Visit (INDEPENDENT_AMBULATORY_CARE_PROVIDER_SITE_OTHER): Payer: Medicare Other | Admitting: Nurse Practitioner

## 2013-07-20 VITALS — BP 140/63 | HR 85 | Temp 98.2°F | Ht 67.0 in | Wt 153.0 lb

## 2013-07-20 DIAGNOSIS — I6350 Cerebral infarction due to unspecified occlusion or stenosis of unspecified cerebral artery: Secondary | ICD-10-CM

## 2013-07-20 DIAGNOSIS — I639 Cerebral infarction, unspecified: Secondary | ICD-10-CM

## 2013-07-20 DIAGNOSIS — F3289 Other specified depressive episodes: Secondary | ICD-10-CM

## 2013-07-20 DIAGNOSIS — F32A Depression, unspecified: Secondary | ICD-10-CM

## 2013-07-20 DIAGNOSIS — F329 Major depressive disorder, single episode, unspecified: Secondary | ICD-10-CM

## 2013-07-20 NOTE — Patient Instructions (Signed)
Continue clopidogrel 75 mg orally every day for secondary stroke prevention and maintain strict control of hypertension with blood pressure goal below 130/90, diabetes with hemoglobin A1c goal below 6.5% and lipids with LDL cholesterol goal below 100 mg/dL.  Stop Aspirin. Continue Lexapro to 10 mg daily.  Return in 6 months.

## 2013-07-20 NOTE — Progress Notes (Signed)
[facility]  PATIENT: Kristin [name] DOB: 1941-04-18   REASON FOR VISIT: follow up HISTORY FROM: patient  HISTORY OF PRESENT ILLNESS: [name] is a 72 year old right-handed Caucasian female who experienced left arm, and leg weakness,with numbness around her mouth. Family took her to [facility] ER from their home in [place], [place]. By the time she arrived at the hospital, most of the symptoms had resolved. She was admitted for workup. 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 After discharge the next day, she experienced transient left peripheral vision which resolved within hours. All of her weakness and numbness has resolved. Patient expresses frustration about short term memory problems since stroke, feeling overwhelmed, and emotional, which is not her normal.   UPDATE 07/20/13 (LL): [name] returns for stroke follow up visit and reports that she is doing very well. Her memory problems have resolved and she has no neurological deficits.  She did increase her Lexapro to 15 mg for a few weeks but then went back to 10 mg and feels fine.  She is still taking aspirin and Plavix daily and has mild bruising.  BP, HgbA1c and cholesterol are at goal.  REVIEW OF SYSTEMS: Full 14 system review of systems performed and notable only for:  Psychiatric: decreased energy  ALLERGIES: Allergies  Allergen Reactions  . Sulfa Antibiotics Rash    HOME MEDICATIONS: Outpatient Prescriptions Prior to Visit  Medication Sig Dispense Refill  . amLODipine (NORVASC) 5 MG tablet Take 5 mg by mouth daily.        . brimonidine-timolol (COMBIGAN) 0.2-0.5 % ophthalmic solution Place 1 drop into the right eye every 12 (twelve) hours.      . Bromfenac Sodium (PROLENSA) 0.07 % SOLN Apply 1 drop to eye at bedtime. In the right eye only      . Calcium Carb-Cholecalciferol (RA CALCIUM 600/VITAMIN D-3) 600-400 MG-UNIT TABS Take 2 tablets by mouth daily.        . clindamycin (CLEOCIN T) 1 %  lotion Apply topically 2 (two) times daily.  60 mL  2  . clopidogrel (PLAVIX) 75 MG tablet Take 1 tablet (75 mg total) by mouth daily with breakfast.  90 tablet  3  . cycloSPORINE (RESTASIS) 0.05 % ophthalmic emulsion Place 1 drop into both eyes 2 (two) times daily.      Marland Kitchen erlotinib (TARCEVA) 150 MG tablet Take 1 tablet (150 mg total) by mouth daily.  30 tablet  2  . escitalopram (LEXAPRO) 10 MG tablet Take 1.5 tablets (15 mg total) by mouth daily.  45 tablet  3  . famotidine (PEPCID) 40 MG tablet Take 40 mg by mouth daily.        Marland Kitchen FeFum-FePoly-FA-B Cmp-C-Biot (FOLIVANE-PLUS) CAPS TAKE 1 CAPSULE BY MOUTH DAILY  90 capsule  1  . letrozole (FEMARA) 2.5 MG tablet Take 1 tablet (2.5 mg total) by mouth daily.  30 tablet  0  . lisinopril (PRINIVIL,ZESTRIL) 10 MG tablet 20 mg.       . zolpidem (AMBIEN) 5 MG tablet at bedtime as needed.       Marland Kitchen aspirin 81 MG tablet Take 1 tablet (81 mg total) by mouth daily.  30 tablet  0  . doxycycline (VIBRA-TABS) 100 MG tablet Take 1 tablet (100 mg total) by mouth 2 (two) times daily.  20 tablet  0  . methylPREDNIsolone (MEDROL DOSPACK) 4 MG tablet follow package directions  21 tablet  0   No facility-administered medications prior to visit.  PAST MEDICAL HISTORY: Past Medical History  Diagnosis Date  . Hypertension   . Anemia   . Breast cancer 2002  . Lung cancer dx'd 12/2010  . Stroke     PAST SURGICAL HISTORY: Past Surgical History  Procedure Laterality Date  . Appendectomy    . Mastectomy      bilateral  . Lung surgery    . Cataracts      bilateral  . Colonoscopy    . Tonsillectomy    . Tee without cardioversion N/A 06/04/2013    Procedure: TRANSESOPHAGEAL ECHOCARDIOGRAM (TEE);  Surgeon: Dolores Patty, MD;  Location: [facility] ENDOSCOPY;  Service: Cardiovascular;  Laterality: N/A;    FAMILY HISTORY: Family History  Problem Relation Age of Onset  . Colon cancer Sister   . Lung cancer Father   . Heart disease Mother   . Cancer Paternal  Grandmother     SOCIAL HISTORY: History   Social History  . Marital Status: Married    Spouse Name: N/A    Number of Children: N/A  . Years of Education: N/A   Occupational History  . Retired    Social History Main Topics  . Smoking status: Never Smoker   . Smokeless tobacco: Never Used  . Alcohol Use: Yes     Comment: very limited, occasional  . Drug Use: No  . Sexual Activity: Not on file   Other Topics Concern  . Not on file   Social History Narrative  . No narrative on file     PHYSICAL EXAM  Filed Vitals:   07/20/13 1449  BP: 140/63  Pulse: 85  Temp: 98.2 F (36.8 C)  TempSrc: Oral  Height: 5\' 7"  (1.702 m)  Weight: 153 lb (69.4 kg)   Body mass index is 23.96 kg/(m^2).  Generalized: In no acute distress, pleasant elderly Caucasian female, well developed, well groomed.  Neck: Supple, no carotid bruits  Cardiac: Regular rate rhythm, no murmur  Pulmonary: Clear to auscultation bilaterally  Musculoskeletal: No deformity  Neurological examination  Mentation: Alert oriented to time, place, history taking, language fluent, and casual conversation.  Cranial nerve II-XII: Pupils were equal round reactive to light extraocular movements were full, visual field were full on confrontational test. facial sensation and strength were normal. hearing was intact to finger rubbing bilaterally. Uvula tongue midline. head turning and shoulder shrug and were normal and symmetric.Tongue protrusion into cheek strength was normal.  MOTOR: normal bulk and tone, full strength in the BUE, BLE, fine finger movements normal, no pronator drift  SENSORY: normal and symmetric to light touch, pinprick, temperature, vibration and proprioception  COORDINATION: finger-nose-finger, heel-to-shin normal bilaterally, there was no truncal ataxia  REFLEXES: Brachioradialis 2/2, biceps 2/2, triceps 2/2, patellar 2/2, Achilles 2/2, plantar responses were flexor bilaterally.  GAIT/STATION: Rising up  from seated position without assistance, normal stance, without trunk ataxia, moderate stride, good arm swing, smooth turning, able to perform tiptoe, and heel walking without difficulty.  Tandem unsteady. Romberg negative.  DIAGNOSTIC DATA (LABS, IMAGING, TESTING) - I reviewed patient records, labs, notes, testing and imaging myself where available.  Lab Results  Component Value Date   WBC 7.5 07/06/2013   HGB 10.5* 07/06/2013   HCT 31.3* 07/06/2013   MCV 90.2 07/06/2013   PLT 407* 07/06/2013    Lab Results  Component Value Date   CHOL 202* 04/30/2013   HDL 93 04/30/2013   LDLCALC 97 04/30/2013   TRIG 62 04/30/2013   CHOLHDL 2.2 04/30/2013   Lab Results  Component Value Date   HGBA1C 5.2 04/30/2013   04/29/2013 CT HEAD WITHOUT CONTRAST No acute intracranial abnormality.  04/30/2013 MRI HEAD WITHOUT CONTRAST 1.5 cm acute infarction in the right occipital lobe. No other focal brain insult. 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 This should not be clinically significant. 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  04/30/13 MRA HEAD WITHOUT CONTRAST No major vessel occlusion or correctable proximal stenosis. Branch vessel stenoses noted in the anterior, middle and posterior cerebral artery territories. These are most pronounced in both PCAs with the single most severe stenosis being at the right P1/P2 junction, the vessel serving the area of acute infarction. 06/04/13 TEE  No evidence of thrombus in the atrial cavity or appendage.  No PFO as tested by color doppler or with injection of agitated saline. 06/01/13 Cardiac Event Monitor 30 day - NSR with occasional PVCs. 04/30/13 Carotid Dopplers-The vertebral arteries appear patent with antegrade flow. Findings consistent with less than 39 percent stenosis involving the right internal carotid artery and the left internal carotid artery.  ASSESSMENT AND PLAN 72 YO female with acute right occipital lobe  infarct. History of cancer on Femara and Tarceva. Patient physically back to her baseline at this time.  Self-reported short-term memory problems have resolved. Etiology of stroke still unclear.  PLAN:  Continue clopidogrel 75 mg orally every day for secondary stroke prevention and maintain strict control of hypertension with blood pressure goal below 130/90, diabetes with hemoglobin A1c goal below 6.5% and lipids with LDL cholesterol goal below 100 mg/dL.  Stop Aspirin. Continue Lexapro to 10 mg daily.  Return in 6 months.  Ronal Fear, MSN, NP-C 07/20/2013, 3:16 PM [facility] [address] [place], [place] [address] [phone]

## 2013-07-28 ENCOUNTER — Other Ambulatory Visit: Payer: Self-pay | Admitting: *Deleted

## 2013-07-28 NOTE — Telephone Encounter (Signed)
Refill request for Tarceva to MD desk for approval.

## 2013-07-29 ENCOUNTER — Other Ambulatory Visit: Payer: Self-pay | Admitting: *Deleted

## 2013-07-29 MED ORDER — ERLOTINIB HCL 150 MG PO TABS: 150.0000 mg | ORAL_TABLET | Freq: Every day | ORAL | Status: DC

## 2013-07-30 ENCOUNTER — Telehealth: Payer: Self-pay | Admitting: *Deleted

## 2013-07-30 ENCOUNTER — Telehealth: Payer: Self-pay | Admitting: Medical Oncology

## 2013-07-30 NOTE — Telephone Encounter (Signed)
THE NOTE WAS GIVEN TO DR.MOHAMED'S NURSE, DIANE BELL,RN.

## 2013-07-30 NOTE — Telephone Encounter (Signed)
Tarceva will be filled oct 29th

## 2013-08-10 ENCOUNTER — Other Ambulatory Visit: Payer: Self-pay | Admitting: Internal Medicine

## 2013-08-10 DIAGNOSIS — Z853 Personal history of malignant neoplasm of breast: Secondary | ICD-10-CM

## 2013-08-12 ENCOUNTER — Other Ambulatory Visit (HOSPITAL_BASED_OUTPATIENT_CLINIC_OR_DEPARTMENT_OTHER): Payer: Medicare Other | Admitting: Lab

## 2013-08-12 ENCOUNTER — Telehealth: Payer: Self-pay | Admitting: *Deleted

## 2013-08-12 ENCOUNTER — Encounter: Payer: Self-pay | Admitting: Physician Assistant

## 2013-08-12 ENCOUNTER — Ambulatory Visit (HOSPITAL_BASED_OUTPATIENT_CLINIC_OR_DEPARTMENT_OTHER): Payer: Medicare Other | Admitting: Physician Assistant

## 2013-08-12 DIAGNOSIS — C3410 Malignant neoplasm of upper lobe, unspecified bronchus or lung: Secondary | ICD-10-CM

## 2013-08-12 DIAGNOSIS — C50919 Malignant neoplasm of unspecified site of unspecified female breast: Secondary | ICD-10-CM

## 2013-08-12 LAB — CBC WITH DIFFERENTIAL/PLATELET
Basophils Absolute: 0 10*3/uL (ref 0.0–0.1)
EOS%: 1.9 % (ref 0.0–7.0)
Eosinophils Absolute: 0.2 10*3/uL (ref 0.0–0.5)
HGB: 10.7 g/dL — ABNORMAL LOW (ref 11.6–15.9)
MCH: 29.3 pg (ref 25.1–34.0)
MONO#: 1.1 10*3/uL — ABNORMAL HIGH (ref 0.1–0.9)
NEUT#: 5.4 10*3/uL (ref 1.5–6.5)
RDW: 13.5 % (ref 11.2–14.5)
WBC: 7.7 10*3/uL (ref 3.9–10.3)
lymph#: 1 10*3/uL (ref 0.9–3.3)

## 2013-08-12 LAB — COMPREHENSIVE METABOLIC PANEL (CC13)
AST: 15 U/L (ref 5–34)
Albumin: 2.9 g/dL — ABNORMAL LOW (ref 3.5–5.0)
BUN: 18.5 mg/dL (ref 7.0–26.0)
CO2: 26 mEq/L (ref 22–29)
Calcium: 9.5 mg/dL (ref 8.4–10.4)
Chloride: 109 mEq/L (ref 98–109)
Potassium: 3.9 mEq/L (ref 3.5–5.1)

## 2013-08-12 NOTE — Progress Notes (Addendum)
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  2) history of breast cancer.   Prior Therapy: None.   Current therapy:  1) Tarceva at 150 mg by mouth daily status post 31 months therapy.  2) Femara 2.5 mg by mouth daily.   CHEMOTHERAPY INTENT: palliative  CURRENT # OF CHEMOTHERAPY CYCLES: 31  CURRENT ANTIEMETICS: Compazine when necessary  CURRENT SMOKING STATUS: Never smoker  ORAL CHEMOTHERAPY AND CONSENT: Tarceva and Femara.  CURRENT BISPHOSPHONATES USE: None  PAIN MANAGEMENT: None  NARCOTICS INDUCED CONSTIPATION: None  LIVING WILL AND CODE STATUS: Full code initially but no prolonged resuscitation.   INTERVAL HISTORY: Kristin [name] 72 y.o. female returns to the clinic today for follow up visit.The patient is feeling fine today with no specific complaints except for mild fatigue and skin rash mainly on the face chest and lower extremities. She denied having any significant diarrhea. The patient denied having any chest pain, shortness of breath, cough or hemoptysis. She is tolerating her treatment with Tarceva and Femara fairly well except for the skin rash. She does report some mild stomach discomfort that she had related to the course of doxycycline and steroid taper for her skin rash flare last month. The symptoms lasted for approximately one and a half to 2 days and have completely resolved.  MEDICAL HISTORY: Past Medical History  Diagnosis Date  . Hypertension   . Anemia   . Breast cancer 2002  . Lung cancer dx'd 12/2010  . Stroke     ALLERGIES:  is allergic to sulfa antibiotics.  MEDICATIONS:  Current Outpatient Prescriptions  Medication Sig Dispense Refill  . amLODipine (NORVASC) 5 MG tablet Take 5 mg  by mouth daily.        . brimonidine-timolol (COMBIGAN) 0.2-0.5 % ophthalmic solution Place 1 drop into the right eye every 12 (twelve) hours.      . Bromfenac Sodium (PROLENSA) 0.07 % SOLN Apply 1 drop to eye at bedtime. In the right eye only      . Calcium Carb-Cholecalciferol (RA CALCIUM 600/VITAMIN D-3) 600-400 MG-UNIT TABS Take 2 tablets by mouth daily.        . clindamycin (CLEOCIN T) 1 % lotion Apply topically 2 (two) times daily.  60 mL  2  . clopidogrel (PLAVIX) 75 MG tablet Take 1 tablet (75 mg total) by mouth daily with breakfast.  90 tablet  3  . cycloSPORINE (RESTASIS) 0.05 % ophthalmic emulsion Place 1 drop into both eyes 2 (two) times daily.      Marland Kitchen erlotinib (TARCEVA) 150 MG tablet Take 1 tablet (150 mg total) by mouth daily.  30 tablet  2  . escitalopram (LEXAPRO) 10 MG tablet Take 10 mg by mouth daily.      . famotidine (PEPCID) 40 MG tablet Take 40 mg by mouth daily.        Marland Kitchen FeFum-FePoly-FA-B Cmp-C-Biot (FOLIVANE-PLUS) CAPS TAKE 1 CAPSULE BY MOUTH DAILY  90 capsule  1  . letrozole (FEMARA) 2.5 MG tablet TAKE ONE TABLET EACH DAY  30 tablet  0  . lisinopril (PRINIVIL,ZESTRIL) 10 MG tablet 20 mg.       . zolpidem (AMBIEN) 5 MG tablet at bedtime as needed.        No current facility-administered  medications for this visit.    SURGICAL HISTORY:  Past Surgical History  Procedure Laterality Date  . Appendectomy    . Mastectomy      bilateral  . Lung surgery    . Cataracts      bilateral  . Colonoscopy    . Tonsillectomy    . Tee without cardioversion N/A 06/04/2013    Procedure: TRANSESOPHAGEAL ECHOCARDIOGRAM (TEE);  Surgeon: Dolores Patty, MD;  Location: [facility] ENDOSCOPY;  Service: Cardiovascular;  Laterality: N/A;    REVIEW OF SYSTEMS:  Constitutional: negative Eyes: negative Ears, nose, mouth, throat, and face: negative Respiratory: negative Cardiovascular: negative Gastrointestinal: positive for abdominal pain and  diarrhea Genitourinary:negative Integument/breast: positive for rash Hematologic/lymphatic: negative Musculoskeletal:negative Neurological: negative Behavioral/Psych: negative Endocrine: negative Allergic/Immunologic: negative   PHYSICAL EXAMINATION: General appearance: alert, cooperative and no distress Head: Normocephalic, without obvious abnormality, atraumatic Neck: no adenopathy, no JVD and thyroid not enlarged, symmetric, no tenderness/mass/nodules Lymph nodes: Cervical, supraclavicular, and axillary nodes normal. Resp: clear to auscultation bilaterally and normal percussion bilaterally Back: symmetric, no curvature. ROM normal. No CVA tenderness. Cardio: regular rate and rhythm, S1, S2 normal, no murmur, click, rub or gallop GI: soft, non-tender; bowel sounds normal; no masses,  no organomegaly Extremities: extremities normal, atraumatic, no cyanosis or edema and Grade 1 skin rash on the lower extremities Neurologic: Alert and oriented X 3, normal strength and tone. Normal symmetric reflexes. Normal coordination and gait Skin: Reveals grade 1 rash on the face and lower extremities.  ECOG PERFORMANCE STATUS: 1 - Symptomatic but completely ambulatory  Blood pressure 146/46, pulse 64, temperature 97.8 F (36.6 C), temperature source Oral, resp. rate 19, height 5\' 7"  (1.702 m), weight 143 lb 4.8 oz (65 kg).  LABORATORY DATA: Lab Results  Component Value Date   WBC 7.7 08/12/2013   HGB 10.7* 08/12/2013   HCT 33.3* 08/12/2013   MCV 91.2 08/12/2013   PLT 442* 08/12/2013      Chemistry      Component Value Date/Time   NA 140 08/12/2013 1018   NA 139 04/30/2013 0430   NA 147* 03/26/2012 1022   K 3.9 08/12/2013 1018   K 4.1 04/30/2013 0430   K 4.8* 03/26/2012 1022   CL 105 04/30/2013 0430   CL 107 04/05/2013 0754   CL 102 03/26/2012 1022   CO2 26 08/12/2013 1018   CO2 27 04/30/2013 0430   CO2 31 03/26/2012 1022   BUN 18.5 08/12/2013 1018   BUN 14 04/30/2013 0430   BUN 14 03/26/2012  1022   CREATININE 0.9 08/12/2013 1018   CREATININE 0.81 04/30/2013 0430   CREATININE 0.7 03/26/2012 1022      Component Value Date/Time   CALCIUM 9.5 08/12/2013 1018   CALCIUM 9.3 04/30/2013 0430   CALCIUM 9.1 03/26/2012 1022   ALKPHOS 80 08/12/2013 1018   ALKPHOS 73 04/30/2013 0430   ALKPHOS 84 03/26/2012 1022   AST 15 08/12/2013 1018   AST 23 04/30/2013 0430   AST 26 03/26/2012 1022   ALT 11 08/12/2013 1018   ALT 15 04/30/2013 0430   ALT 20 03/26/2012 1022   BILITOT 1.04 08/12/2013 1018   BILITOT 0.3 04/30/2013 0430   BILITOT 0.70 03/26/2012 1022       RADIOGRAPHIC STUDIES: Ct Chest Wo Contrast  07/06/2013   CLINICAL DATA:  Lung cancer.  EXAM: CT CHEST WITHOUT CONTRAST  TECHNIQUE: Multidetector CT imaging of the chest was performed following the standard protocol without IV contrast.  COMPARISON:  04/05/2013.  FINDINGS: The chest wall is unremarkable and stable. No supraclavicular or axillary mass or adenopathy. The bony thorax is intact. No destructive bone lesions or spinal canal compromise.  The heart is normal in size. No pericardial effusion. No mediastinal or hilar mass or adenopathy. The aorta is normal in caliber. Minimal atherosclerotic calcifications. The esophagus is normal except for a stable large hiatal hernia.  Examination of the lung parenchyma demonstrates stable left upper lobe scarring changes. No CT findings for return tumor or pulmonary metastatic disease. Mild hyperinflation is again demonstrated. No interstitial lung disease or bronchiectasis. No pleural effusion.  The upper abdomen is unremarkable. Stable hepatic cyst.  IMPRESSION: Stable CT appearance of the chest. No findings for recurrent tumor or metastatic disease.   Electronically Signed   By: Loralie Champagne M.D.   On: 07/06/2013 14:25    ASSESSMENT AND PLAN: this is a very pleasant 72 years old white female with history of metastatic non-small cell lung cancer, adenocarcinoma currently on treatment with Tarceva for the  last 31 months with no evidence for disease progression. Patient was discussed with and also seen by Dr. Arbutus Ped. She will continue on Tarceva 150 mg by mouth daily. She will return in one month for another symptom management visit with a repeat CBC differential and C. met. The GI issues that the patient mention of more most likely related to the course of antibiotics and steroids although also could have been related to her Tarceva therapy. She will continue to monitor the symptoms and keep [place] informed.  For the history of the breast cancer, the patient will continue on Femara 2.5 mg by mouth daily.  Conni Slipper, PA-C   The patient voices understanding of current disease status and treatment options and is in agreement with the current care plan.  All questions were answered. The patient knows to call the clinic with any problems, questions or concerns. We can certainly see the patient much sooner if necessary.  ADDENDUM: Hematology/Oncology Attending: I had a face to face encounter with the patient. I recommended her care. This is a very pleasant 72 years old white female with metastatic non-small cell lung cancer, adenocarcinoma with positive EGFR mutation currently undergoing treatment with oral Tarceva and tolerating it fairly well with no significant adverse effects. The patient denied having any significant diarrhea and her skin rash has significantly improved after she was treated with doxycycline but this was discontinued secondary to upset stomach. I recommended for the patient to continue her current treatment with Tarceva and him as scheduled. She would come back for followup visit in one month's was in next cycle of her treatment. Lajuana Matte., MD 08/15/2013

## 2013-08-12 NOTE — Telephone Encounter (Signed)
appts made and printed...td 

## 2013-08-12 NOTE — Patient Instructions (Addendum)
Continue taking Tarceva 150 mg by mouth daily Continue Femara 2.5 mg by mouth daily Followup in one month for another symptom management visit

## 2013-08-26 ENCOUNTER — Other Ambulatory Visit: Payer: Self-pay

## 2013-09-09 ENCOUNTER — Telehealth: Payer: Self-pay | Admitting: Internal Medicine

## 2013-09-09 ENCOUNTER — Encounter: Payer: Self-pay | Admitting: Physician Assistant

## 2013-09-09 ENCOUNTER — Other Ambulatory Visit (HOSPITAL_BASED_OUTPATIENT_CLINIC_OR_DEPARTMENT_OTHER): Payer: Medicare Other | Admitting: Lab

## 2013-09-09 ENCOUNTER — Ambulatory Visit (HOSPITAL_BASED_OUTPATIENT_CLINIC_OR_DEPARTMENT_OTHER): Payer: Medicare Other | Admitting: Physician Assistant

## 2013-09-09 DIAGNOSIS — Z853 Personal history of malignant neoplasm of breast: Secondary | ICD-10-CM

## 2013-09-09 DIAGNOSIS — R21 Rash and other nonspecific skin eruption: Secondary | ICD-10-CM

## 2013-09-09 DIAGNOSIS — C3410 Malignant neoplasm of upper lobe, unspecified bronchus or lung: Secondary | ICD-10-CM

## 2013-09-09 LAB — CBC WITH DIFFERENTIAL/PLATELET
EOS%: 3.8 % (ref 0.0–7.0)
Eosinophils Absolute: 0.3 10*3/uL (ref 0.0–0.5)
MCH: 29.4 pg (ref 25.1–34.0)
MCV: 89.6 fL (ref 79.5–101.0)
MONO%: 12.8 % (ref 0.0–14.0)
NEUT#: 4.6 10*3/uL (ref 1.5–6.5)
RBC: 3.57 10*6/uL — ABNORMAL LOW (ref 3.70–5.45)
RDW: 13.8 % (ref 11.2–14.5)

## 2013-09-09 LAB — COMPREHENSIVE METABOLIC PANEL (CC13)
AST: 23 U/L (ref 5–34)
Albumin: 3.1 g/dL — ABNORMAL LOW (ref 3.5–5.0)
Alkaline Phosphatase: 81 U/L (ref 40–150)
Potassium: 4.2 mEq/L (ref 3.5–5.1)
Sodium: 141 mEq/L (ref 136–145)
Total Protein: 6.2 g/dL — ABNORMAL LOW (ref 6.4–8.3)

## 2013-09-09 NOTE — Progress Notes (Signed)
[facility] [phone]   [phone]  SHARED VISIT PROGRESS NOTE  [name], MD [address] [place] [address]  Principle Diagnosis:  1) Metastatic non-small cell lung cancer adenocarcinoma with positive for EGFR mutation at exon 21 diagnosed in March of 2012.  2) history of breast cancer.   Prior Therapy: None.   Current therapy:  1) Tarceva at 150 mg by mouth daily status post 32 months therapy.  2) Femara 2.5 mg by mouth daily.   CHEMOTHERAPY INTENT: palliative  CURRENT # OF CHEMOTHERAPY CYCLES: 32  CURRENT ANTIEMETICS: Compazine when necessary  CURRENT SMOKING STATUS: Never smoker  ORAL CHEMOTHERAPY AND CONSENT: Tarceva and Femara.  CURRENT BISPHOSPHONATES USE: None  PAIN MANAGEMENT: None  NARCOTICS INDUCED CONSTIPATION: None  LIVING WILL AND CODE STATUS: Full code initially but no prolonged resuscitation.   INTERVAL HISTORY: Kristin [name] 72 y.o. female returns to the clinic today for follow up visit.The patient is feeling fine today with no specific complaints except for mild fatigue and skin rash now mainly on the scalp with crusting scabs and itching of the scalp lesions.She denied having any significant diarrhea. The patient denied having any chest pain, shortness of breath, cough or hemoptysis. She is tolerating her treatment with Tarceva and Femara fairly well except for the skin rash. She voiced no other specific complaints today.   MEDICAL HISTORY: Past Medical History  Diagnosis Date  . Hypertension   . Anemia   . Breast cancer 2002  . Lung cancer dx'd 12/2010  . Stroke     ALLERGIES:  is allergic to sulfa antibiotics.  MEDICATIONS:  Current Outpatient Prescriptions  Medication Sig Dispense Refill  . amLODipine (NORVASC) 5 MG tablet Take 5 mg by mouth daily.        . Calcium Carb-Cholecalciferol (RA CALCIUM 600/VITAMIN D-3) 600-400 MG-UNIT TABS Take 2 tablets by mouth daily.        . clindamycin  (CLEOCIN T) 1 % lotion Apply topically 2 (two) times daily.  60 mL  2  . clopidogrel (PLAVIX) 75 MG tablet Take 1 tablet (75 mg total) by mouth daily with breakfast.  90 tablet  3  . cycloSPORINE (RESTASIS) 0.05 % ophthalmic emulsion Place 1 drop into both eyes 2 (two) times daily.      Marland Kitchen erlotinib (TARCEVA) 150 MG tablet Take 1 tablet (150 mg total) by mouth daily.  30 tablet  2  . escitalopram (LEXAPRO) 10 MG tablet Take 10 mg by mouth daily.      . famotidine (PEPCID) 40 MG tablet Take 40 mg by mouth daily.        Marland Kitchen FeFum-FePoly-FA-B Cmp-C-Biot (FOLIVANE-PLUS) CAPS TAKE 1 CAPSULE BY MOUTH DAILY  90 capsule  1  . latanoprost (XALATAN) 0.005 % ophthalmic solution       . letrozole (FEMARA) 2.5 MG tablet TAKE ONE TABLET EACH DAY  30 tablet  0  . lisinopril (PRINIVIL,ZESTRIL) 10 MG tablet 20 mg.       . zolpidem (AMBIEN) 5 MG tablet at bedtime as needed.       . brimonidine-timolol (COMBIGAN) 0.2-0.5 % ophthalmic solution Place 1 drop into the right eye every 12 (twelve) hours.      . Bromfenac Sodium (PROLENSA) 0.07 % SOLN Apply 1 drop to eye at bedtime. In the right eye only       No current facility-administered medications for this visit.    SURGICAL HISTORY:  Past Surgical History  Procedure Laterality  Date  . Appendectomy    . Mastectomy      bilateral  . Lung surgery    . Cataracts      bilateral  . Colonoscopy    . Tonsillectomy    . Tee without cardioversion N/A 06/04/2013    Procedure: TRANSESOPHAGEAL ECHOCARDIOGRAM (TEE);  Surgeon: Dolores Patty, MD;  Location: [facility] ENDOSCOPY;  Service: Cardiovascular;  Laterality: N/A;    REVIEW OF SYSTEMS:  Constitutional: negative Eyes: negative Ears, nose, mouth, throat, and face: negative Respiratory: negative Cardiovascular: negative Gastrointestinal: positive for abdominal pain and diarrhea Genitourinary:negative Integument/breast: positive for rash Hematologic/lymphatic: negative Musculoskeletal:negative Neurological:  negative Behavioral/Psych: negative Endocrine: negative Allergic/Immunologic: negative   PHYSICAL EXAMINATION: General appearance: alert, cooperative and no distress Head: Normocephalic, without obvious abnormality, atraumatic Neck: no adenopathy, no JVD and thyroid not enlarged, symmetric, no tenderness/mass/nodules Lymph nodes: Cervical, supraclavicular, and axillary nodes normal. Resp: clear to auscultation bilaterally and normal percussion bilaterally Back: symmetric, no curvature. ROM normal. No CVA tenderness. Cardio: regular rate and rhythm, S1, S2 normal, no murmur, click, rub or gallop GI: soft, non-tender; bowel sounds normal; no masses,  no organomegaly Extremities: extremities normal, atraumatic, no cyanosis or edema and Grade 1 skin rash on the lower extremities Neurologic: Alert and oriented X 3, normal strength and tone. Normal symmetric reflexes. Normal coordination and gait Skin: Reveals grade 1 rash on the face  And Grade 1-2  Through out the lateral and posterior scalp with raised crusted scabs. No bloody or serosanguinous drainage  ECOG PERFORMANCE STATUS: 1 - Symptomatic but completely ambulatory  Blood pressure 157/48, pulse 60, temperature 97.9 F (36.6 C), temperature source Oral, resp. rate 18, height 5\' 7"  (1.702 m), weight 144 lb 8 oz (65.545 kg).  LABORATORY DATA: Lab Results  Component Value Date   WBC 6.8 09/09/2013   HGB 10.5* 09/09/2013   HCT 32.0* 09/09/2013   MCV 89.6 09/09/2013   PLT 426* 09/09/2013      Chemistry      Component Value Date/Time   NA 141 09/09/2013 0944   NA 139 04/30/2013 0430   NA 147* 03/26/2012 1022   K 4.2 09/09/2013 0944   K 4.1 04/30/2013 0430   K 4.8* 03/26/2012 1022   CL 105 04/30/2013 0430   CL 107 04/05/2013 0754   CL 102 03/26/2012 1022   CO2 25 09/09/2013 0944   CO2 27 04/30/2013 0430   CO2 31 03/26/2012 1022   BUN 20.4 09/09/2013 0944   BUN 14 04/30/2013 0430   BUN 14 03/26/2012 1022   CREATININE 0.8 09/09/2013 0944    CREATININE 0.81 04/30/2013 0430   CREATININE 0.7 03/26/2012 1022      Component Value Date/Time   CALCIUM 9.5 09/09/2013 0944   CALCIUM 9.3 04/30/2013 0430   CALCIUM 9.1 03/26/2012 1022   ALKPHOS 81 09/09/2013 0944   ALKPHOS 73 04/30/2013 0430   ALKPHOS 84 03/26/2012 1022   AST 23 09/09/2013 0944   AST 23 04/30/2013 0430   AST 26 03/26/2012 1022   ALT 13 09/09/2013 0944   ALT 15 04/30/2013 0430   ALT 20 03/26/2012 1022   BILITOT 0.55 09/09/2013 0944   BILITOT 0.3 04/30/2013 0430   BILITOT 0.70 03/26/2012 1022       RADIOGRAPHIC STUDIES: Ct Chest Wo Contrast  07/06/2013   CLINICAL DATA:  Lung cancer.  EXAM: CT CHEST WITHOUT CONTRAST  TECHNIQUE: Multidetector CT imaging of the chest was performed following the standard protocol without IV contrast.  COMPARISON:  04/05/2013.  FINDINGS: The chest wall is unremarkable and stable. No supraclavicular or axillary mass or adenopathy. The bony thorax is intact. No destructive bone lesions or spinal canal compromise.  The heart is normal in size. No pericardial effusion. No mediastinal or hilar mass or adenopathy. The aorta is normal in caliber. Minimal atherosclerotic calcifications. The esophagus is normal except for a stable large hiatal hernia.  Examination of the lung parenchyma demonstrates stable left upper lobe scarring changes. No CT findings for return tumor or pulmonary metastatic disease. Mild hyperinflation is again demonstrated. No interstitial lung disease or bronchiectasis. No pleural effusion.  The upper abdomen is unremarkable. Stable hepatic cyst.  IMPRESSION: Stable CT appearance of the chest. No findings for recurrent tumor or metastatic disease.   Electronically Signed   By: Loralie Champagne M.D.   On: 07/06/2013 14:25    ASSESSMENT AND PLAN: this is a very pleasant 72 years old white female with history of metastatic non-small cell lung cancer, adenocarcinoma currently on treatment with Tarceva for the last 32 months with no evidence for disease  progression. Patient was discussed with and also seen by Dr. Arbutus Ped. She will continue on Tarceva 150 mg by mouth daily. She will return in one month for another symptom management visit with a repeat CBC differential and C. met. She may use the clindamycin solution on her scalp for her Tarceva-related grade 1-2 rash. She'll followup with Dr. Arbutus Ped in one month with a restaging CT scan of her chest without contrast to reevaluate her disease.  For the history of the breast cancer, the patient will continue on Femara 2.5 mg by mouth daily.  Conni Slipper, PA-C   The patient voices understanding of current disease status and treatment options and is in agreement with the current care plan.  All questions were answered. The patient knows to call the clinic with any problems, questions or concerns. We can certainly see the patient much sooner if necessary.   ADDENDUM: Hematology/Oncology Attending: I had a face to face encounter with the patient. I recommended her care plan. This is a very pleasant 72 years old white female with metastatic non-small cell lung cancer, adenocarcinoma with positive EGFR mutation currently undergoing treatment was Tarceva 150 mg by mouth daily for the last 72 months and tolerating it fairly well with no significant adverse effects except for mild skin rash mainly on the scalp with crusting. I recommended for her to apply clindamycin lotion to this area and to consult with a dermatologist if there is no improvement in her condition. The patient will continue her current treatment with Tarceva as scheduled. She would come back for followup visit in one month with repeat CT scan of the chest for restaging of her disease. She was advised to call immediately if she has any concerning symptoms in the interval. Lajuana Matte., MD 09/12/2013

## 2013-09-09 NOTE — Patient Instructions (Signed)
Continue taking  Tarceva 150 mg by mouth daily Continue taking Femara 2.5mg  by mouth daily You may use the clindamycin solution on your scalp twice daily as needed Follow up in 1 month with a restaging CT scan of your chest to re-evaluate your disease

## 2013-09-09 NOTE — Telephone Encounter (Signed)
per 11/20 POF appts made Pt adv CT will call her AVS and CAL given shh

## 2013-10-04 ENCOUNTER — Ambulatory Visit (HOSPITAL_COMMUNITY)
Admission: RE | Admit: 2013-10-04 | Discharge: 2013-10-04 | Disposition: A | Discharge status: Home / Self Care | Payer: Medicare Other | Source: Ambulatory Visit | Attending: Physician Assistant | Admitting: Physician Assistant

## 2013-10-04 DIAGNOSIS — J438 Other emphysema: Secondary | ICD-10-CM | POA: Insufficient documentation

## 2013-10-04 DIAGNOSIS — I2510 Atherosclerotic heart disease of native coronary artery without angina pectoris: Secondary | ICD-10-CM | POA: Insufficient documentation

## 2013-10-04 DIAGNOSIS — C3410 Malignant neoplasm of upper lobe, unspecified bronchus or lung: Secondary | ICD-10-CM | POA: Insufficient documentation

## 2013-10-04 DIAGNOSIS — Z853 Personal history of malignant neoplasm of breast: Secondary | ICD-10-CM | POA: Insufficient documentation

## 2013-10-07 ENCOUNTER — Other Ambulatory Visit (HOSPITAL_BASED_OUTPATIENT_CLINIC_OR_DEPARTMENT_OTHER): Payer: Medicare Other

## 2013-10-07 ENCOUNTER — Telehealth: Payer: Self-pay | Admitting: Internal Medicine

## 2013-10-07 ENCOUNTER — Ambulatory Visit (HOSPITAL_BASED_OUTPATIENT_CLINIC_OR_DEPARTMENT_OTHER): Payer: Medicare Other

## 2013-10-07 ENCOUNTER — Ambulatory Visit (HOSPITAL_BASED_OUTPATIENT_CLINIC_OR_DEPARTMENT_OTHER): Payer: Medicare Other | Admitting: Internal Medicine

## 2013-10-07 DIAGNOSIS — Z23 Encounter for immunization: Secondary | ICD-10-CM

## 2013-10-07 DIAGNOSIS — C3410 Malignant neoplasm of upper lobe, unspecified bronchus or lung: Secondary | ICD-10-CM

## 2013-10-07 DIAGNOSIS — C50919 Malignant neoplasm of unspecified site of unspecified female breast: Secondary | ICD-10-CM

## 2013-10-07 LAB — CBC WITH DIFFERENTIAL/PLATELET
EOS%: 4.8 % (ref 0.0–7.0)
Eosinophils Absolute: 0.2 10*3/uL (ref 0.0–0.5)
LYMPH%: 24.7 % (ref 14.0–49.7)
MCH: 29.8 pg (ref 25.1–34.0)
MCHC: 33.6 g/dL (ref 31.5–36.0)
MONO#: 0.7 10*3/uL (ref 0.1–0.9)
NEUT#: 2.6 10*3/uL (ref 1.5–6.5)
NEUT%: 54.8 % (ref 38.4–76.8)
Platelets: 372 10*3/uL (ref 145–400)
RBC: 3.87 10*6/uL (ref 3.70–5.45)
RDW: 14.1 % (ref 11.2–14.5)
WBC: 4.8 10*3/uL (ref 3.9–10.3)
lymph#: 1.2 10*3/uL (ref 0.9–3.3)

## 2013-10-07 LAB — COMPREHENSIVE METABOLIC PANEL (CC13)
ALT: 16 U/L (ref 0–55)
AST: 22 U/L (ref 5–34)
Albumin: 3.4 g/dL — ABNORMAL LOW (ref 3.5–5.0)
Anion Gap: 6 mEq/L (ref 3–11)
CO2: 27 mEq/L (ref 22–29)
Calcium: 9.7 mg/dL (ref 8.4–10.4)
Chloride: 106 mEq/L (ref 98–109)
Creatinine: 0.9 mg/dL (ref 0.6–1.1)
Glucose: 105 mg/dl (ref 70–140)
Potassium: 4.9 mEq/L (ref 3.5–5.1)
Sodium: 139 mEq/L (ref 136–145)
Total Protein: 6.3 g/dL — ABNORMAL LOW (ref 6.4–8.3)

## 2013-10-07 MED ORDER — INFLUENZA VAC SPLIT QUAD 0.5 ML IM SUSP
0.5000 mL | Freq: Once | INTRAMUSCULAR | Status: AC
  Administered 2013-10-07: 0.5 mL via INTRAMUSCULAR
  Filled 2013-10-07: qty 0.5

## 2013-10-07 NOTE — Progress Notes (Signed)
[facility] [phone]   Fax:([phone]  OFFICE PROGRESS NOTE  [name], MD [address] 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  2) history of breast cancer.   Prior Therapy: None.   Current therapy:  1) Tarceva at 150 mg by mouth daily status post 33 months therapy.  2) Femara 2.5 mg by mouth daily.   CHEMOTHERAPY INTENT: palliative  CURRENT # OF CHEMOTHERAPY CYCLES: 34 CURRENT ANTIEMETICS: Compazine when necessary  CURRENT SMOKING STATUS: Never smoker  ORAL CHEMOTHERAPY AND CONSENT: Tarceva and Femara.  CURRENT BISPHOSPHONATES USE: None  PAIN MANAGEMENT: None  NARCOTICS INDUCED CONSTIPATION: None  LIVING WILL AND CODE STATUS: Full code initially but no prolonged resuscitation.   INTERVAL HISTORY: Kristin [name] 72 y.o. female returns to the clinic today for follow up visit accompanied by her daughter-in-law. The patient is feeling fine today with no specific complaints. She has mild skin rash. She denied having any significant diarrhea. The patient denied having any chest pain, shortness of breath, cough or hemoptysis. She is rating her treatment with Tarceva and Femara fairly well except for the skin rash. The patient had repeat CT scan of the chest performed recently and she is here for evaluation and discussion of her scan results.  MEDICAL HISTORY: Past Medical History  Diagnosis Date  . Hypertension   . Anemia   . Breast cancer 2002  . Lung cancer dx'd 12/2010  . Stroke     ALLERGIES:  is allergic to sulfa antibiotics.  MEDICATIONS:  Current Outpatient Prescriptions  Medication Sig Dispense Refill  . amLODipine (NORVASC) 5 MG tablet Take 5 mg by mouth daily.        . brimonidine-timolol (COMBIGAN) 0.2-0.5 % ophthalmic solution Place 1 drop into the right eye every 12 (twelve) hours.       . Bromfenac Sodium (PROLENSA) 0.07 % SOLN Apply 1 drop to eye at bedtime. In the right eye only      . Calcium Carb-Cholecalciferol (RA CALCIUM 600/VITAMIN D-3) 600-400 MG-UNIT TABS Take 2 tablets by mouth daily.        . clindamycin (CLEOCIN T) 1 % lotion Apply topically 2 (two) times daily.  60 mL  2  . clopidogrel (PLAVIX) 75 MG tablet Take 1 tablet (75 mg total) by mouth daily with breakfast.  90 tablet  3  . cycloSPORINE (RESTASIS) 0.05 % ophthalmic emulsion Place 1 drop into both eyes 2 (two) times daily.      Marland Kitchen erlotinib (TARCEVA) 150 MG tablet Take 1 tablet (150 mg total) by mouth daily.  30 tablet  2  . escitalopram (LEXAPRO) 10 MG tablet Take 10 mg by mouth daily.      . famotidine (PEPCID) 40 MG tablet Take 40 mg by mouth daily.        Marland Kitchen FeFum-FePoly-FA-B Cmp-C-Biot (FOLIVANE-PLUS) CAPS TAKE 1 CAPSULE BY MOUTH DAILY  90 capsule  1  . latanoprost (XALATAN) 0.005 % ophthalmic solution       . letrozole (FEMARA) 2.5 MG tablet TAKE ONE TABLET EACH DAY  30 tablet  0  . lisinopril (PRINIVIL,ZESTRIL) 10 MG tablet 20 mg.       . zolpidem (AMBIEN) 5 MG tablet at bedtime as needed.        No current facility-administered medications for this visit.    SURGICAL HISTORY:  Past Surgical  History  Procedure Laterality Date  . Appendectomy    . Mastectomy      bilateral  . Lung surgery    . Cataracts      bilateral  . Colonoscopy    . Tonsillectomy    . Tee without cardioversion N/A 06/04/2013    Procedure: TRANSESOPHAGEAL ECHOCARDIOGRAM (TEE);  Surgeon: Dolores Patty, MD;  Location: [facility] ENDOSCOPY;  Service: Cardiovascular;  Laterality: N/A;    REVIEW OF SYSTEMS:  Constitutional: negative Eyes: negative Ears, nose, mouth, throat, and face: negative Respiratory: negative Cardiovascular: negative Gastrointestinal: negative Genitourinary:negative Integument/breast: positive for rash Hematologic/lymphatic: negative Musculoskeletal:negative Neurological:  negative Behavioral/Psych: negative Endocrine: negative Allergic/Immunologic: negative   PHYSICAL EXAMINATION: General appearance: alert, cooperative and no distress Head: Normocephalic, without obvious abnormality, atraumatic Neck: no adenopathy, no JVD and thyroid not enlarged, symmetric, no tenderness/mass/nodules Lymph nodes: Cervical, supraclavicular, and axillary nodes normal. Resp: clear to auscultation bilaterally and normal percussion bilaterally Back: symmetric, no curvature. ROM normal. No CVA tenderness. Cardio: regular rate and rhythm, S1, S2 normal, no murmur, click, rub or gallop GI: soft, non-tender; bowel sounds normal; no masses,  no organomegaly Extremities: extremities normal, atraumatic, no cyanosis or edema and Grade 2 skin rash on the lower extremities Neurologic: Alert and oriented X 3, normal strength and tone. Normal symmetric reflexes. Normal coordination and gait  ECOG PERFORMANCE STATUS: 1 - Symptomatic but completely ambulatory  Blood pressure 167/67, pulse 59, temperature 96.9 F (36.1 C), temperature source Oral, resp. rate 18, height 5\' 7"  (1.702 m), weight 141 lb 14.4 oz (64.365 kg).  LABORATORY DATA: Lab Results  Component Value Date   WBC 4.8 10/07/2013   HGB 11.6 10/07/2013   HCT 34.4* 10/07/2013   MCV 88.7 10/07/2013   PLT 372 10/07/2013      Chemistry      Component Value Date/Time   NA 139 10/07/2013 1119   NA 139 04/30/2013 0430   NA 147* 03/26/2012 1022   K 4.9 10/07/2013 1119   K 4.1 04/30/2013 0430   K 4.8* 03/26/2012 1022   CL 105 04/30/2013 0430   CL 107 04/05/2013 0754   CL 102 03/26/2012 1022   CO2 27 10/07/2013 1119   CO2 27 04/30/2013 0430   CO2 31 03/26/2012 1022   BUN 18.1 10/07/2013 1119   BUN 14 04/30/2013 0430   BUN 14 03/26/2012 1022   CREATININE 0.9 10/07/2013 1119   CREATININE 0.81 04/30/2013 0430   CREATININE 0.7 03/26/2012 1022      Component Value Date/Time   CALCIUM 9.7 10/07/2013 1119   CALCIUM 9.3 04/30/2013 0430    CALCIUM 9.1 03/26/2012 1022   ALKPHOS 79 10/07/2013 1119   ALKPHOS 73 04/30/2013 0430   ALKPHOS 84 03/26/2012 1022   AST 22 10/07/2013 1119   AST 23 04/30/2013 0430   AST 26 03/26/2012 1022   ALT 16 10/07/2013 1119   ALT 15 04/30/2013 0430   ALT 20 03/26/2012 1022   BILITOT 0.72 10/07/2013 1119   BILITOT 0.3 04/30/2013 0430   BILITOT 0.70 03/26/2012 1022       RADIOGRAPHIC STUDIES: Ct Chest Wo Contrast  07/06/2013   CLINICAL DATA:  Lung cancer.  EXAM: CT CHEST WITHOUT CONTRAST  TECHNIQUE: Multidetector CT imaging of the chest was performed following the standard protocol without IV contrast.  COMPARISON:  04/05/2013.  FINDINGS: The chest wall is unremarkable and stable. No supraclavicular or axillary mass or adenopathy. The bony thorax is intact. No destructive bone lesions or spinal canal compromise.  The heart is normal in size. No pericardial effusion. No mediastinal or hilar mass or adenopathy. The aorta is normal in caliber. Minimal atherosclerotic calcifications. The esophagus is normal except for a stable large hiatal hernia.  Examination of the lung parenchyma demonstrates stable left upper lobe scarring changes. No CT findings for return tumor or pulmonary metastatic disease. Mild hyperinflation is again demonstrated. No interstitial lung disease or bronchiectasis. No pleural effusion.  The upper abdomen is unremarkable. Stable hepatic cyst.  IMPRESSION: Stable CT appearance of the chest. No findings for recurrent tumor or metastatic disease.   Electronically Signed   By: Loralie Champagne M.D.   On: 07/06/2013 14:25    ASSESSMENT AND PLAN: this is a very pleasant 72 years old white female with history of metastatic non-small cell lung cancer, adenocarcinoma currently on treatment with Tarceva for the last 33 months with no evidence for disease progression. I discussed the scan results with the patient and her daughter-in-law today. I recommended for her to continue treatment with Tarceva with the  same dose. For the history of the breast cancer, the patient will continue on Femara 2.5 mg by mouth daily. She would come back for follow up visit in one month's for reevaluation.  The patient voices understanding of current disease status and treatment options and is in agreement with the current care plan.  All questions were answered. The patient knows to call the clinic with any problems, questions or concerns. We can certainly see the patient much sooner if necessary.  I spent 15 minutes counseling the patient face to face. The total time spent in the appointment was 25 minutes.

## 2013-10-07 NOTE — Telephone Encounter (Signed)
gv and printed appt sch and avs for pt for Jan 2015

## 2013-10-09 ENCOUNTER — Encounter: Payer: Self-pay | Admitting: Internal Medicine

## 2013-10-09 NOTE — Patient Instructions (Signed)
Follow up visit in one month with repeat CBC and comprehensive metabolic panel

## 2013-10-25 ENCOUNTER — Encounter: Payer: Self-pay | Admitting: *Deleted

## 2013-10-25 NOTE — Progress Notes (Signed)
RECEIVED A FAX FROM [facility] CONCERNING A CONFIRMATION OF PRESCRIPTION SHIPMENT FOR Kristin [name] ON 10/27/13.

## 2013-11-01 ENCOUNTER — Ambulatory Visit (INDEPENDENT_AMBULATORY_CARE_PROVIDER_SITE_OTHER): Payer: Medicare Other | Admitting: Ophthalmology

## 2013-11-01 DIAGNOSIS — H35359 Cystoid macular degeneration, unspecified eye: Secondary | ICD-10-CM

## 2013-11-01 DIAGNOSIS — D3130 Benign neoplasm of unspecified choroid: Secondary | ICD-10-CM

## 2013-11-01 DIAGNOSIS — H43819 Vitreous degeneration, unspecified eye: Secondary | ICD-10-CM

## 2013-11-01 DIAGNOSIS — I10 Essential (primary) hypertension: Secondary | ICD-10-CM

## 2013-11-01 DIAGNOSIS — H35379 Puckering of macula, unspecified eye: Secondary | ICD-10-CM

## 2013-11-01 DIAGNOSIS — H35039 Hypertensive retinopathy, unspecified eye: Secondary | ICD-10-CM

## 2013-11-04 ENCOUNTER — Encounter: Payer: Self-pay | Admitting: Physician Assistant

## 2013-11-04 ENCOUNTER — Telehealth: Payer: Self-pay | Admitting: Internal Medicine

## 2013-11-04 ENCOUNTER — Other Ambulatory Visit (HOSPITAL_BASED_OUTPATIENT_CLINIC_OR_DEPARTMENT_OTHER): Payer: Medicare Other

## 2013-11-04 ENCOUNTER — Ambulatory Visit (HOSPITAL_BASED_OUTPATIENT_CLINIC_OR_DEPARTMENT_OTHER): Payer: Medicare Other | Admitting: Physician Assistant

## 2013-11-04 VITALS — BP 158/44 | HR 55 | Temp 98.1°F | Resp 18 | Ht 67.0 in | Wt 143.0 lb

## 2013-11-04 DIAGNOSIS — C3410 Malignant neoplasm of upper lobe, unspecified bronchus or lung: Secondary | ICD-10-CM

## 2013-11-04 DIAGNOSIS — C50919 Malignant neoplasm of unspecified site of unspecified female breast: Secondary | ICD-10-CM

## 2013-11-04 DIAGNOSIS — R21 Rash and other nonspecific skin eruption: Secondary | ICD-10-CM

## 2013-11-04 LAB — CBC WITH DIFFERENTIAL/PLATELET
BASO%: 1.3 % (ref 0.0–2.0)
BASOS ABS: 0.1 10*3/uL (ref 0.0–0.1)
EOS ABS: 0.2 10*3/uL (ref 0.0–0.5)
EOS%: 4.1 % (ref 0.0–7.0)
HCT: 32.4 % — ABNORMAL LOW (ref 34.8–46.6)
HGB: 10.7 g/dL — ABNORMAL LOW (ref 11.6–15.9)
LYMPH%: 19.6 % (ref 14.0–49.7)
MCH: 29.5 pg (ref 25.1–34.0)
MCHC: 33.1 g/dL (ref 31.5–36.0)
MCV: 89 fL (ref 79.5–101.0)
MONO#: 0.7 10*3/uL (ref 0.1–0.9)
MONO%: 14.1 % — AB (ref 0.0–14.0)
NEUT%: 60.9 % (ref 38.4–76.8)
NEUTROS ABS: 3.2 10*3/uL (ref 1.5–6.5)
PLATELETS: 403 10*3/uL — AB (ref 145–400)
RBC: 3.64 10*6/uL — ABNORMAL LOW (ref 3.70–5.45)
RDW: 14.5 % (ref 11.2–14.5)
WBC: 5.2 10*3/uL (ref 3.9–10.3)
lymph#: 1 10*3/uL (ref 0.9–3.3)

## 2013-11-04 LAB — COMPREHENSIVE METABOLIC PANEL (CC13)
ALK PHOS: 75 U/L (ref 40–150)
ALT: 13 U/L (ref 0–55)
AST: 17 U/L (ref 5–34)
Albumin: 3.2 g/dL — ABNORMAL LOW (ref 3.5–5.0)
Anion Gap: 7 mEq/L (ref 3–11)
BILIRUBIN TOTAL: 0.52 mg/dL (ref 0.20–1.20)
BUN: 17.2 mg/dL (ref 7.0–26.0)
CO2: 27 mEq/L (ref 22–29)
CREATININE: 0.8 mg/dL (ref 0.6–1.1)
Calcium: 9.3 mg/dL (ref 8.4–10.4)
Chloride: 106 mEq/L (ref 98–109)
GLUCOSE: 103 mg/dL (ref 70–140)
Potassium: 4.5 mEq/L (ref 3.5–5.1)
Sodium: 141 mEq/L (ref 136–145)
Total Protein: 6 g/dL — ABNORMAL LOW (ref 6.4–8.3)

## 2013-11-04 NOTE — Telephone Encounter (Signed)
gv and printed appt sched and avs for pt for Feb thru April °

## 2013-11-04 NOTE — Patient Instructions (Signed)
Continue taking Tarceva 150 mg by mouth daily Continue taking Femara 2.5 mg by mouth daily Follow up in one month

## 2013-11-04 NOTE — Progress Notes (Addendum)
[place] [phone]   [phone]  SHARED VISIT PROGRESS NOTE  Kristin Hong, MD [address] [place] [address]  Principle Diagnosis:  1) Metastatic non-small cell lung cancer adenocarcinoma with positive for EGFR mutation at exon 21 diagnosed in March of 2012.  2) history of breast cancer.   Prior Therapy: None.   Current therapy:  1) Tarceva at 150 mg by mouth daily status post 34 months therapy.  2) Femara 2.5 mg by mouth daily.   CHEMOTHERAPY INTENT: palliative  CURRENT # OF CHEMOTHERAPY CYCLES: 35 CURRENT ANTIEMETICS: Compazine when necessary  CURRENT SMOKING STATUS: Never smoker  ORAL CHEMOTHERAPY AND CONSENT: Tarceva and Femara.  CURRENT BISPHOSPHONATES USE: None  PAIN MANAGEMENT: None  NARCOTICS INDUCED CONSTIPATION: None  LIVING WILL AND CODE STATUS: Full code initially but no prolonged resuscitation.   INTERVAL HISTORY: Kristin [name] 73 y.o. female returns to the clinic today for follow up visit. The patient is feeling fine today with no specific complaints. She has mild skin rash. She denied having any significant diarrhea. The patient denied having any chest pain, shortness of breath, cough or hemoptysis. She is rating her treatment with Tarceva and Femara fairly well except for the skin rash.   MEDICAL HISTORY: Past Medical History  Diagnosis Date  . Hypertension   . Anemia   . Breast cancer 2002  . Lung cancer dx'd 12/2010  . Stroke     ALLERGIES:  is allergic to sulfa antibiotics.  MEDICATIONS:  Current Outpatient Prescriptions  Medication Sig Dispense Refill  . amLODipine (NORVASC) 5 MG tablet Take 5 mg by mouth daily.        . brimonidine-timolol (COMBIGAN) 0.2-0.5 % ophthalmic solution Place 1 drop into the right eye every 12 (twelve) hours.      . Calcium Carb-Cholecalciferol (RA CALCIUM 600/VITAMIN D-3) 600-400 MG-UNIT TABS Take 2 tablets by mouth daily.        . clindamycin (CLEOCIN T) 1 %  lotion Apply topically 2 (two) times daily.  60 mL  2  . clopidogrel (PLAVIX) 75 MG tablet Take 1 tablet (75 mg total) by mouth daily with breakfast.  90 tablet  3  . cycloSPORINE (RESTASIS) 0.05 % ophthalmic emulsion Place 1 drop into both eyes 2 (two) times daily.      Marland Kitchen erlotinib (TARCEVA) 150 MG tablet Take 1 tablet (150 mg total) by mouth daily.  30 tablet  2  . escitalopram (LEXAPRO) 10 MG tablet Take 10 mg by mouth daily.      . famotidine (PEPCID) 40 MG tablet Take 40 mg by mouth daily.        Marland Kitchen FeFum-FePoly-FA-B Cmp-C-Biot (FOLIVANE-PLUS) CAPS TAKE 1 CAPSULE BY MOUTH DAILY  90 capsule  1  . ILEVRO 0.3 % SUSP       . letrozole (FEMARA) 2.5 MG tablet TAKE ONE TABLET EACH DAY  30 tablet  0  . lisinopril (PRINIVIL,ZESTRIL) 10 MG tablet 20 mg.       . zolpidem (AMBIEN) 5 MG tablet at bedtime as needed.       . latanoprost (XALATAN) 0.005 % ophthalmic solution        No current facility-administered medications for this visit.    SURGICAL HISTORY:  Past Surgical History  Procedure Laterality Date  . Appendectomy    . Mastectomy      bilateral  . Lung surgery    . Cataracts      bilateral  . Colonoscopy    .  Tonsillectomy    . Tee without cardioversion N/A 06/04/2013    Procedure: TRANSESOPHAGEAL ECHOCARDIOGRAM (TEE);  Surgeon: Jolaine Artist, MD;  Location: [facility] ENDOSCOPY;  Service: Cardiovascular;  Laterality: N/A;    REVIEW OF SYSTEMS:  Constitutional: negative Eyes: negative Ears, nose, mouth, throat, and face: negative Respiratory: negative Cardiovascular: negative Gastrointestinal: negative Genitourinary:negative Integument/breast: positive for rash Hematologic/lymphatic: negative Musculoskeletal:negative Neurological: negative Behavioral/Psych: negative Endocrine: negative Allergic/Immunologic: negative   PHYSICAL EXAMINATION: General appearance: alert, cooperative and no distress Head: Normocephalic, without obvious abnormality, atraumatic Neck: no  adenopathy, no JVD and thyroid not enlarged, symmetric, no tenderness/mass/nodules Lymph nodes: Cervical, supraclavicular, and axillary nodes normal. Resp: clear to auscultation bilaterally and normal percussion bilaterally Back: symmetric, no curvature. ROM normal. No CVA tenderness. Cardio: regular rate and rhythm, S1, S2 normal, no murmur, click, rub or gallop GI: soft, non-tender; bowel sounds normal; no masses,  no organomegaly Extremities: extremities normal, atraumatic, no cyanosis or edema and Grade 2 skin rash on the lower extremities Neurologic: Alert and oriented X 3, normal strength and tone. Normal symmetric reflexes. Normal coordination and gait  ECOG PERFORMANCE STATUS: 1 - Symptomatic but completely ambulatory  Blood pressure 158/44, pulse 55, temperature 98.1 F (36.7 C), temperature source Oral, resp. rate 18, height _0  (1.702 m), weight 143 lb (64.864 kg).  LABORATORY DATA: Lab Results  Component Value Date   WBC 5.2 11/04/2013   HGB 10.7* 11/04/2013   HCT 32.4* 11/04/2013   MCV 89.0 11/04/2013   PLT 403* 11/04/2013      Chemistry      Component Value Date/Time   NA 141 11/04/2013 0955   NA 139 04/30/2013 0430   NA 147* 03/26/2012 1022   K 4.5 11/04/2013 0955   K 4.1 04/30/2013 0430   K 4.8* 03/26/2012 1022   CL 105 04/30/2013 0430   CL 107 04/05/2013 0754   CL 102 03/26/2012 1022   CO2 27 11/04/2013 0955   CO2 27 04/30/2013 0430   CO2 31 03/26/2012 1022   BUN 17.2 11/04/2013 0955   BUN 14 04/30/2013 0430   BUN 14 03/26/2012 1022   CREATININE 0.8 11/04/2013 0955   CREATININE 0.81 04/30/2013 0430   CREATININE 0.7 03/26/2012 1022      Component Value Date/Time   CALCIUM 9.3 11/04/2013 0955   CALCIUM 9.3 04/30/2013 0430   CALCIUM 9.1 03/26/2012 1022   ALKPHOS 75 11/04/2013 0955   ALKPHOS 73 04/30/2013 0430   ALKPHOS 84 03/26/2012 1022   AST 17 11/04/2013 0955   AST 23 04/30/2013 0430   AST 26 03/26/2012 1022   ALT 13 11/04/2013 0955   ALT 15 04/30/2013 0430   ALT 20 03/26/2012 1022    BILITOT 0.52 11/04/2013 0955   BILITOT 0.3 04/30/2013 0430   BILITOT 0.70 03/26/2012 1022       RADIOGRAPHIC STUDIES: Ct Chest Wo Contrast  07/06/2013   CLINICAL DATA:  Lung cancer.  EXAM: CT CHEST WITHOUT CONTRAST  TECHNIQUE: Multidetector CT imaging of the chest was performed following the standard protocol without IV contrast.  COMPARISON:  04/05/2013.  FINDINGS: The chest wall is unremarkable and stable. No supraclavicular or axillary mass or adenopathy. The bony thorax is intact. No destructive bone lesions or spinal canal compromise.  The heart is normal in size. No pericardial effusion. No mediastinal or hilar mass or adenopathy. The aorta is normal in caliber. Minimal atherosclerotic calcifications. The esophagus is normal except for a stable large hiatal hernia.  Examination of the lung parenchyma  demonstrates stable left upper lobe scarring changes. No CT findings for return tumor or pulmonary metastatic disease. Mild hyperinflation is again demonstrated. No interstitial lung disease or bronchiectasis. No pleural effusion.  The upper abdomen is unremarkable. Stable hepatic cyst.  IMPRESSION: Stable CT appearance of the chest. No findings for recurrent tumor or metastatic disease.   Electronically Signed   By: Kalman Jewels M.D.   On: 07/06/2013 14:25    ASSESSMENT AND PLAN: this is a very pleasant 73 years old white female with history of metastatic non-small cell lung cancer, adenocarcinoma currently on treatment with Tarceva for the last 34 months with no evidence for disease progression. Patient was discussed with him also seen by Dr. Julien Nordmann. She will continue on Tarceva at 150 mg by mouth daily. For the history of the breast cancer, the patient will continue on Femara 2.5 mg by mouth daily. She will followup in one month for another symptom management visit with repeat CBC differential and C. met.  The patient voices understanding of current disease status and treatment options and is in  agreement with the current care plan.  All questions were answered. The patient knows to call the clinic with any problems, questions or concerns. We can certainly see the patient much sooner if necessary.  Carlton Adam PA-C  ADDENDUM: Hematology/Oncology Attending: I had the face to face encounter with the patient. I recommended his care plan. This is a very pleasant  73 years old white female with metastatic non-small cell lung cancer, adenocarcinoma with positive EGFR mutation. She is currently undergoing treatment with single agent Tarceva since March of 2012 with no evidence for disease progression. She is tolerating her treatment fairly well. The patient is also currently on treatment with Femara for history of breast cancer. I recommended for her to continue her current treatment with Tarceva and Femara as scheduled. She would come back for follow up visit in one month's for reevaluation with repeat blood work. She was advised to call immediately if she has any concerning symptoms in the interval.  Disclaimer: This note was dictated with voice recognition software. Similar sounding words can inadvertently be transcribed and may not be corrected upon review. Eilleen Kempf., MD 11/06/2013

## 2013-11-23 ENCOUNTER — Other Ambulatory Visit: Payer: Self-pay | Admitting: Internal Medicine

## 2013-11-25 ENCOUNTER — Other Ambulatory Visit: Payer: Self-pay | Admitting: *Deleted

## 2013-11-25 NOTE — Telephone Encounter (Signed)
THIS REFILL REQUEST FOR TARCEVA WAS GIVEN TO DR.MOHAMED'S NURSE, DIANE BELL,RN.

## 2013-11-26 ENCOUNTER — Other Ambulatory Visit: Payer: Self-pay | Admitting: Medical Oncology

## 2013-11-26 DIAGNOSIS — C3490 Malignant neoplasm of unspecified part of unspecified bronchus or lung: Secondary | ICD-10-CM

## 2013-11-26 MED ORDER — ERLOTINIB HCL 150 MG PO TABS: 150.0000 mg | ORAL_TABLET | Freq: Every day | ORAL | Status: DC

## 2013-11-26 NOTE — Telephone Encounter (Signed)
tarceva refill sent electronically to [facility] for authorization.

## 2013-11-29 ENCOUNTER — Other Ambulatory Visit: Payer: Self-pay | Admitting: *Deleted

## 2013-11-29 DIAGNOSIS — C3490 Malignant neoplasm of unspecified part of unspecified bronchus or lung: Secondary | ICD-10-CM

## 2013-11-29 MED ORDER — ERLOTINIB HCL 150 MG PO TABS: 150.0000 mg | ORAL_TABLET | Freq: Every day | ORAL | Status: DC

## 2013-12-02 ENCOUNTER — Ambulatory Visit (HOSPITAL_BASED_OUTPATIENT_CLINIC_OR_DEPARTMENT_OTHER): Payer: Medicare Other | Admitting: Internal Medicine

## 2013-12-02 ENCOUNTER — Encounter: Payer: Self-pay | Admitting: Internal Medicine

## 2013-12-02 ENCOUNTER — Other Ambulatory Visit (HOSPITAL_BASED_OUTPATIENT_CLINIC_OR_DEPARTMENT_OTHER): Payer: Medicare Other

## 2013-12-02 ENCOUNTER — Telehealth: Payer: Self-pay | Admitting: Internal Medicine

## 2013-12-02 VITALS — BP 183/57 | HR 61 | Temp 97.5°F | Resp 18 | Ht 67.0 in | Wt 145.0 lb

## 2013-12-02 DIAGNOSIS — C3410 Malignant neoplasm of upper lobe, unspecified bronchus or lung: Secondary | ICD-10-CM

## 2013-12-02 DIAGNOSIS — R21 Rash and other nonspecific skin eruption: Secondary | ICD-10-CM

## 2013-12-02 DIAGNOSIS — C50919 Malignant neoplasm of unspecified site of unspecified female breast: Secondary | ICD-10-CM

## 2013-12-02 LAB — COMPREHENSIVE METABOLIC PANEL (CC13)
ALT: 18 U/L (ref 0–55)
ANION GAP: 8 meq/L (ref 3–11)
AST: 21 U/L (ref 5–34)
Albumin: 3.5 g/dL (ref 3.5–5.0)
Alkaline Phosphatase: 86 U/L (ref 40–150)
BUN: 16.9 mg/dL (ref 7.0–26.0)
CALCIUM: 9.6 mg/dL (ref 8.4–10.4)
CHLORIDE: 107 meq/L (ref 98–109)
CO2: 27 meq/L (ref 22–29)
Creatinine: 0.9 mg/dL (ref 0.6–1.1)
Glucose: 95 mg/dl (ref 70–140)
Potassium: 4.5 mEq/L (ref 3.5–5.1)
SODIUM: 141 meq/L (ref 136–145)
Total Bilirubin: 0.51 mg/dL (ref 0.20–1.20)
Total Protein: 6.4 g/dL (ref 6.4–8.3)

## 2013-12-02 LAB — CBC WITH DIFFERENTIAL/PLATELET
BASO%: 1.4 % (ref 0.0–2.0)
Basophils Absolute: 0.1 10*3/uL (ref 0.0–0.1)
EOS%: 3.8 % (ref 0.0–7.0)
Eosinophils Absolute: 0.2 10*3/uL (ref 0.0–0.5)
HCT: 34.5 % — ABNORMAL LOW (ref 34.8–46.6)
HGB: 11.5 g/dL — ABNORMAL LOW (ref 11.6–15.9)
LYMPH%: 18.2 % (ref 14.0–49.7)
MCH: 29.7 pg (ref 25.1–34.0)
MCHC: 33.2 g/dL (ref 31.5–36.0)
MCV: 89.5 fL (ref 79.5–101.0)
MONO#: 0.8 10*3/uL (ref 0.1–0.9)
MONO%: 12.2 % (ref 0.0–14.0)
NEUT#: 4 10*3/uL (ref 1.5–6.5)
NEUT%: 64.4 % (ref 38.4–76.8)
Platelets: 427 10*3/uL — ABNORMAL HIGH (ref 145–400)
RBC: 3.86 10*6/uL (ref 3.70–5.45)
RDW: 15.3 % — ABNORMAL HIGH (ref 11.2–14.5)
WBC: 6.2 10*3/uL (ref 3.9–10.3)
lymph#: 1.1 10*3/uL (ref 0.9–3.3)

## 2013-12-02 NOTE — Telephone Encounter (Signed)
RECEIVED A FAX FROM [facility] CONCERNING A CONFIRMATION OF PT. REFERRAL.

## 2013-12-02 NOTE — Telephone Encounter (Signed)
gave pt appt for lab and MD on march 2015

## 2013-12-02 NOTE — Progress Notes (Signed)
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  2) history of breast cancer.   Prior Therapy: None.   Current therapy:  1) Tarceva at 150 mg by mouth daily status post 35 months therapy.  2) Femara 2.5 mg by mouth daily.   CHEMOTHERAPY INTENT: palliative  CURRENT # OF CHEMOTHERAPY CYCLES: 36 CURRENT ANTIEMETICS: Compazine when necessary  CURRENT SMOKING STATUS: Never smoker  ORAL CHEMOTHERAPY AND CONSENT: Tarceva and Femara.  CURRENT BISPHOSPHONATES USE: None  PAIN MANAGEMENT: None  NARCOTICS INDUCED CONSTIPATION: None  LIVING WILL AND CODE STATUS: Full code initially but no prolonged resuscitation.   INTERVAL HISTORY: Kristin [name] 73 y.o. female returns to the clinic today for follow up visit. The patient is feeling fine today with no specific complaints. She has mild skin rash. She denied having any significant diarrhea. The patient denied having any chest pain, shortness of breath, cough or hemoptysis. She is tolerating her treatment with Tarceva and Femara fairly well except for the skin rash. She denied having any significant nausea or vomiting, no fever or chills. She has no weight loss or night sweats.  MEDICAL HISTORY: Past Medical History  Diagnosis Date  . Hypertension   . Anemia   . Breast cancer 2002  . Lung cancer dx'd 12/2010  . Stroke     ALLERGIES:  is allergic to sulfa antibiotics.  MEDICATIONS:  Current Outpatient Prescriptions  Medication Sig Dispense Refill  . amLODipine (NORVASC) 5 MG tablet Take 5 mg by mouth daily.        . brimonidine-timolol (COMBIGAN) 0.2-0.5 % ophthalmic solution Place 1 drop into the right eye every 12 (twelve) hours.      . Calcium Carb-Cholecalciferol (RA CALCIUM  600/VITAMIN D-3) 600-400 MG-UNIT TABS Take 2 tablets by mouth daily.        . clindamycin (CLEOCIN T) 1 % lotion Apply topically 2 (two) times daily.  60 mL  2  . clopidogrel (PLAVIX) 75 MG tablet Take 1 tablet (75 mg total) by mouth daily with breakfast.  90 tablet  3  . cycloSPORINE (RESTASIS) 0.05 % ophthalmic emulsion Place 1 drop into both eyes 2 (two) times daily.      Marland Kitchen erlotinib (TARCEVA) 150 MG tablet Take 1 tablet (150 mg total) by mouth daily.  30 tablet  2  . escitalopram (LEXAPRO) 10 MG tablet Take 10 mg by mouth daily.      . famotidine (PEPCID) 40 MG tablet Take 40 mg by mouth daily.        Marland Kitchen FeFum-FePoly-FA-B Cmp-C-Biot (FOLIVANE-PLUS) CAPS TAKE 1 CAPSULE BY MOUTH DAILY  90 capsule  1  . ILEVRO 0.3 % SUSP       . latanoprost (XALATAN) 0.005 % ophthalmic solution       . letrozole (FEMARA) 2.5 MG tablet TAKE ONE TABLET EACH DAY  30 tablet  5  . lisinopril (PRINIVIL,ZESTRIL) 10 MG tablet 20 mg.       . zolpidem (AMBIEN) 5 MG tablet at bedtime as needed.       . [DISCONTINUED] erlotinib (TARCEVA) 150 MG tablet Take 1 tablet (150 mg total) by mouth daily.  30 tablet  2   No current facility-administered medications for this visit.    SURGICAL HISTORY:  Past  Surgical History  Procedure Laterality Date  . Appendectomy    . Mastectomy      bilateral  . Lung surgery    . Cataracts      bilateral  . Colonoscopy    . Tonsillectomy    . Tee without cardioversion N/A 06/04/2013    Procedure: TRANSESOPHAGEAL ECHOCARDIOGRAM (TEE);  Surgeon: Jolaine Artist, MD;  Location: [facility] ENDOSCOPY;  Service: Cardiovascular;  Laterality: N/A;    REVIEW OF SYSTEMS:  Constitutional: negative Eyes: negative Ears, nose, mouth, throat, and face: negative Respiratory: negative Cardiovascular: negative Gastrointestinal: negative Genitourinary:negative Integument/breast: positive for rash Hematologic/lymphatic: negative Musculoskeletal:negative Neurological: negative Behavioral/Psych:  negative Endocrine: negative Allergic/Immunologic: negative   PHYSICAL EXAMINATION: General appearance: alert, cooperative and no distress Head: Normocephalic, without obvious abnormality, atraumatic Neck: no adenopathy, no JVD and thyroid not enlarged, symmetric, no tenderness/mass/nodules Lymph nodes: Cervical, supraclavicular, and axillary nodes normal. Resp: clear to auscultation bilaterally and normal percussion bilaterally Back: symmetric, no curvature. ROM normal. No CVA tenderness. Cardio: regular rate and rhythm, S1, S2 normal, no murmur, click, rub or gallop GI: soft, non-tender; bowel sounds normal; no masses,  no organomegaly Extremities: extremities normal, atraumatic, no cyanosis or edema and Grade 2 skin rash on the lower extremities Neurologic: Alert and oriented X 3, normal strength and tone. Normal symmetric reflexes. Normal coordination and gait  ECOG PERFORMANCE STATUS: 1 - Symptomatic but completely ambulatory  Blood pressure 183/57, pulse 61, temperature 97.5 F (36.4 C), temperature source Oral, resp. rate 18, height 5' 7"  (1.702 m), weight 145 lb (65.772 kg), SpO2 99.00%.  LABORATORY DATA: Lab Results  Component Value Date   WBC 6.2 12/02/2013   HGB 11.5* 12/02/2013   HCT 34.5* 12/02/2013   MCV 89.5 12/02/2013   PLT 427* 12/02/2013      Chemistry      Component Value Date/Time   NA 141 12/02/2013 0940   NA 139 04/30/2013 0430   NA 147* 03/26/2012 1022   K 4.5 12/02/2013 0940   K 4.1 04/30/2013 0430   K 4.8* 03/26/2012 1022   CL 105 04/30/2013 0430   CL 107 04/05/2013 0754   CL 102 03/26/2012 1022   CO2 27 12/02/2013 0940   CO2 27 04/30/2013 0430   CO2 31 03/26/2012 1022   BUN 16.9 12/02/2013 0940   BUN 14 04/30/2013 0430   BUN 14 03/26/2012 1022   CREATININE 0.9 12/02/2013 0940   CREATININE 0.81 04/30/2013 0430   CREATININE 0.7 03/26/2012 1022      Component Value Date/Time   CALCIUM 9.6 12/02/2013 0940   CALCIUM 9.3 04/30/2013 0430   CALCIUM 9.1 03/26/2012 1022    ALKPHOS 86 12/02/2013 0940   ALKPHOS 73 04/30/2013 0430   ALKPHOS 84 03/26/2012 1022   AST 21 12/02/2013 0940   AST 23 04/30/2013 0430   AST 26 03/26/2012 1022   ALT 18 12/02/2013 0940   ALT 15 04/30/2013 0430   ALT 20 03/26/2012 1022   BILITOT 0.51 12/02/2013 0940   BILITOT 0.3 04/30/2013 0430   BILITOT 0.70 03/26/2012 1022       RADIOGRAPHIC STUDIES:   ASSESSMENT AND PLAN: this is a very pleasant 73 years old white female with history of metastatic non-small cell lung cancer, adenocarcinoma currently on treatment with Tarceva for the last 35 months with no evidence for disease progression. I recommended for the patient to continue her current treatment with Tarceva with the same dose.  For the history of the breast cancer, the patient will continue  on Femara 2.5 mg by mouth daily. She would come back for follow up visit in one month for reevaluation.  The patient voices understanding of current disease status and treatment options and is in agreement with the current care plan.  All questions were answered. The patient knows to call the clinic with any problems, questions or concerns. We can certainly see the patient much sooner if necessary.

## 2013-12-29 ENCOUNTER — Ambulatory Visit (HOSPITAL_COMMUNITY)
Admission: RE | Admit: 2013-12-29 | Discharge: 2013-12-29 | Disposition: A | Discharge status: Home / Self Care | Payer: Medicare Other | Source: Ambulatory Visit | Attending: Internal Medicine | Admitting: Internal Medicine

## 2013-12-29 ENCOUNTER — Other Ambulatory Visit (HOSPITAL_BASED_OUTPATIENT_CLINIC_OR_DEPARTMENT_OTHER): Payer: Medicare Other

## 2013-12-29 ENCOUNTER — Encounter (HOSPITAL_COMMUNITY): Payer: Self-pay

## 2013-12-29 DIAGNOSIS — C3410 Malignant neoplasm of upper lobe, unspecified bronchus or lung: Secondary | ICD-10-CM

## 2013-12-29 DIAGNOSIS — C50919 Malignant neoplasm of unspecified site of unspecified female breast: Secondary | ICD-10-CM | POA: Insufficient documentation

## 2013-12-29 DIAGNOSIS — C3490 Malignant neoplasm of unspecified part of unspecified bronchus or lung: Secondary | ICD-10-CM | POA: Insufficient documentation

## 2013-12-29 LAB — CBC WITH DIFFERENTIAL/PLATELET
BASO%: 1.1 % (ref 0.0–2.0)
Basophils Absolute: 0.1 10*3/uL (ref 0.0–0.1)
EOS%: 3 % (ref 0.0–7.0)
Eosinophils Absolute: 0.3 10*3/uL (ref 0.0–0.5)
HCT: 34.6 % — ABNORMAL LOW (ref 34.8–46.6)
HGB: 11.3 g/dL — ABNORMAL LOW (ref 11.6–15.9)
LYMPH#: 1.2 10*3/uL (ref 0.9–3.3)
LYMPH%: 13.1 % — ABNORMAL LOW (ref 14.0–49.7)
MCH: 29.4 pg (ref 25.1–34.0)
MCHC: 32.6 g/dL (ref 31.5–36.0)
MCV: 90.3 fL (ref 79.5–101.0)
MONO#: 1.1 10*3/uL — ABNORMAL HIGH (ref 0.1–0.9)
MONO%: 12 % (ref 0.0–14.0)
NEUT#: 6.6 10*3/uL — ABNORMAL HIGH (ref 1.5–6.5)
NEUT%: 70.8 % (ref 38.4–76.8)
Platelets: 386 10*3/uL (ref 145–400)
RBC: 3.83 10*6/uL (ref 3.70–5.45)
RDW: 14.3 % (ref 11.2–14.5)
WBC: 9.3 10*3/uL (ref 3.9–10.3)

## 2013-12-29 LAB — COMPREHENSIVE METABOLIC PANEL (CC13)
ALK PHOS: 91 U/L (ref 40–150)
ALT: 13 U/L (ref 0–55)
AST: 17 U/L (ref 5–34)
Albumin: 3.2 g/dL — ABNORMAL LOW (ref 3.5–5.0)
Anion Gap: 7 mEq/L (ref 3–11)
BUN: 20.7 mg/dL (ref 7.0–26.0)
CALCIUM: 9.5 mg/dL (ref 8.4–10.4)
CHLORIDE: 107 meq/L (ref 98–109)
CO2: 27 mEq/L (ref 22–29)
Creatinine: 0.8 mg/dL (ref 0.6–1.1)
Glucose: 107 mg/dl (ref 70–140)
Potassium: 4.3 mEq/L (ref 3.5–5.1)
Sodium: 141 mEq/L (ref 136–145)
Total Bilirubin: 0.6 mg/dL (ref 0.20–1.20)
Total Protein: 6.4 g/dL (ref 6.4–8.3)

## 2013-12-29 MED ORDER — IOHEXOL 300 MG/ML  SOLN
80.0000 mL | Freq: Once | INTRAMUSCULAR | Status: AC | PRN
  Administered 2013-12-29: 80 mL via INTRAVENOUS

## 2013-12-30 ENCOUNTER — Telehealth: Payer: Self-pay | Admitting: Internal Medicine

## 2013-12-30 ENCOUNTER — Encounter: Payer: Self-pay | Admitting: Internal Medicine

## 2013-12-30 ENCOUNTER — Other Ambulatory Visit: Payer: Medicare Other

## 2013-12-30 ENCOUNTER — Ambulatory Visit (HOSPITAL_BASED_OUTPATIENT_CLINIC_OR_DEPARTMENT_OTHER): Payer: Medicare Other | Admitting: Internal Medicine

## 2013-12-30 VITALS — BP 161/53 | HR 57 | Temp 96.9°F | Resp 18 | Ht 67.0 in | Wt 149.3 lb

## 2013-12-30 DIAGNOSIS — R21 Rash and other nonspecific skin eruption: Secondary | ICD-10-CM

## 2013-12-30 DIAGNOSIS — C3410 Malignant neoplasm of upper lobe, unspecified bronchus or lung: Secondary | ICD-10-CM

## 2013-12-30 DIAGNOSIS — C50919 Malignant neoplasm of unspecified site of unspecified female breast: Secondary | ICD-10-CM

## 2013-12-30 NOTE — Telephone Encounter (Signed)
Gave pt appt for lab and Md for April 2015

## 2013-12-30 NOTE — Progress Notes (Signed)
[place] [phone]   [phone]  OFFICE PROGRESS NOTE  Eber Hong, MD [address] [place] [address]  Principle Diagnosis:  1) Metastatic non-small cell lung cancer adenocarcinoma with positive for EGFR mutation at exon 21 diagnosed in March of 2012.  2) history of breast cancer.   Prior Therapy: None.   Current therapy:  1) Tarceva at 150 mg by mouth daily status post 36 months therapy.  2) Femara 2.5 mg by mouth daily.   CHEMOTHERAPY INTENT: palliative  CURRENT # OF CHEMOTHERAPY CYCLES: 37 CURRENT ANTIEMETICS: Compazine when necessary  CURRENT SMOKING STATUS: Never smoker  ORAL CHEMOTHERAPY AND CONSENT: Tarceva and Femara.  CURRENT BISPHOSPHONATES USE: None  PAIN MANAGEMENT: None  NARCOTICS INDUCED CONSTIPATION: None  LIVING WILL AND CODE STATUS: Full code initially but no prolonged resuscitation.   INTERVAL HISTORY: Kristin [name] 73 y.o. female returns to the clinic today for follow up visit accompanied by her daughter. The patient is feeling fine today with no specific complaints. She has mild skin rash mainly on the legs. She denied having any significant diarrhea. The patient denied having any chest pain, shortness of breath, cough or hemoptysis. She is tolerating her treatment with Tarceva and Femara fairly well except for the skin rash. She denied having any significant nausea or vomiting, no fever or chills. She has no weight loss or night sweats. She had repeat CT scan of the chest performed recently and she is here for evaluation and discussion of her scan results.  MEDICAL HISTORY: Past Medical History  Diagnosis Date  . Hypertension   . Anemia   . Breast cancer 2002  . Lung cancer dx'd 12/2010  . Stroke     ALLERGIES:  is allergic to sulfa antibiotics.  MEDICATIONS:  Current Outpatient Prescriptions  Medication Sig Dispense Refill  . amLODipine (NORVASC) 5 MG tablet Take 5 mg by mouth daily.          . brimonidine-timolol (COMBIGAN) 0.2-0.5 % ophthalmic solution Place 1 drop into the right eye every 12 (twelve) hours.      . Calcium Carb-Cholecalciferol (RA CALCIUM 600/VITAMIN D-3) 600-400 MG-UNIT TABS Take 2 tablets by mouth daily.        . clindamycin (CLEOCIN T) 1 % lotion Apply topically 2 (two) times daily.  60 mL  2  . clopidogrel (PLAVIX) 75 MG tablet Take 1 tablet (75 mg total) by mouth daily with breakfast.  90 tablet  3  . cycloSPORINE (RESTASIS) 0.05 % ophthalmic emulsion Place 1 drop into both eyes 2 (two) times daily.      Marland Kitchen erlotinib (TARCEVA) 150 MG tablet Take 1 tablet (150 mg total) by mouth daily.  30 tablet  2  . escitalopram (LEXAPRO) 10 MG tablet Take 15 mg by mouth daily. Takes 45m and 129mtablet daily = 1550maily      . escitalopram (LEXAPRO) 5 mg TABS tablet Take 5 mg by mouth. Takes 5mg59md 10mg24mlet daily = 15mg 42my      . famotidine (PEPCID) 40 MG tablet Take 40 mg by mouth daily.        . FeFuMarland Kitchen-FePoly-FA-B Cmp-C-Biot (FOLIVANE-PLUS) CAPS TAKE 1 CAPSULE BY MOUTH DAILY  90 capsule  1  . letrozole (FEMARA) 2.5 MG tablet TAKE ONE TABLET EACH DAY  30 tablet  5  . lisinopril (PRINIVIL,ZESTRIL) 10 MG tablet 20 mg.       . zolpidem (AMBIEN) 5 MG tablet at bedtime as needed.       . [  DISCONTINUED] erlotinib (TARCEVA) 150 MG tablet Take 1 tablet (150 mg total) by mouth daily.  30 tablet  2   No current facility-administered medications for this visit.    SURGICAL HISTORY:  Past Surgical History  Procedure Laterality Date  . Appendectomy    . Mastectomy      bilateral  . Lung surgery    . Cataracts      bilateral  . Colonoscopy    . Tonsillectomy    . Tee without cardioversion N/A 06/04/2013    Procedure: TRANSESOPHAGEAL ECHOCARDIOGRAM (TEE);  Surgeon: Jolaine Artist, MD;  Location: [facility] ENDOSCOPY;  Service: Cardiovascular;  Laterality: N/A;    REVIEW OF SYSTEMS:  Constitutional: negative Eyes: negative Ears, nose, mouth, throat, and face:  negative Respiratory: negative Cardiovascular: negative Gastrointestinal: negative Genitourinary:negative Integument/breast: positive for rash Hematologic/lymphatic: negative Musculoskeletal:negative Neurological: negative Behavioral/Psych: negative Endocrine: negative Allergic/Immunologic: negative   PHYSICAL EXAMINATION: General appearance: alert, cooperative and no distress Head: Normocephalic, without obvious abnormality, atraumatic Neck: no adenopathy, no JVD and thyroid not enlarged, symmetric, no tenderness/mass/nodules Lymph nodes: Cervical, supraclavicular, and axillary nodes normal. Resp: clear to auscultation bilaterally and normal percussion bilaterally Back: symmetric, no curvature. ROM normal. No CVA tenderness. Cardio: regular rate and rhythm, S1, S2 normal, no murmur, click, rub or gallop GI: soft, non-tender; bowel sounds normal; no masses,  no organomegaly Extremities: extremities normal, atraumatic, no cyanosis or edema and Grade 2 skin rash on the lower extremities Neurologic: Alert and oriented X 3, normal strength and tone. Normal symmetric reflexes. Normal coordination and gait  ECOG PERFORMANCE STATUS: 1 - Symptomatic but completely ambulatory  Blood pressure 161/53, pulse 57, temperature 96.9 F (36.1 C), temperature source Oral, resp. rate 18, height 5' 7"  (1.702 m), weight 149 lb 4.8 oz (67.722 kg).  LABORATORY DATA: Lab Results  Component Value Date   WBC 9.3 12/29/2013   HGB 11.3* 12/29/2013   HCT 34.6* 12/29/2013   MCV 90.3 12/29/2013   PLT 386 12/29/2013      Chemistry      Component Value Date/Time   NA 141 12/29/2013 1128   NA 139 04/30/2013 0430   NA 147* 03/26/2012 1022   K 4.3 12/29/2013 1128   K 4.1 04/30/2013 0430   K 4.8* 03/26/2012 1022   CL 105 04/30/2013 0430   CL 107 04/05/2013 0754   CL 102 03/26/2012 1022   CO2 27 12/29/2013 1128   CO2 27 04/30/2013 0430   CO2 31 03/26/2012 1022   BUN 20.7 12/29/2013 1128   BUN 14 04/30/2013 0430   BUN 14  03/26/2012 1022   CREATININE 0.8 12/29/2013 1128   CREATININE 0.81 04/30/2013 0430   CREATININE 0.7 03/26/2012 1022      Component Value Date/Time   CALCIUM 9.5 12/29/2013 1128   CALCIUM 9.3 04/30/2013 0430   CALCIUM 9.1 03/26/2012 1022   ALKPHOS 91 12/29/2013 1128   ALKPHOS 73 04/30/2013 0430   ALKPHOS 84 03/26/2012 1022   AST 17 12/29/2013 1128   AST 23 04/30/2013 0430   AST 26 03/26/2012 1022   ALT 13 12/29/2013 1128   ALT 15 04/30/2013 0430   ALT 20 03/26/2012 1022   BILITOT 0.60 12/29/2013 1128   BILITOT 0.3 04/30/2013 0430   BILITOT 0.70 03/26/2012 1022        RADIOGRAPHIC STUDIES: Ct Chest W Contrast  12/29/2013   CLINICAL DATA Breast cancer, lung cancer.  Oral chemotherapy.  EXAM CT CHEST WITH CONTRAST  TECHNIQUE Multidetector CT imaging of the chest  was performed during intravenous contrast administration.  CONTRAST 86m OMNIPAQUE IOHEXOL 300 MG/ML  SOLN  COMPARISON CT CHEST W/O CM dated 10/04/2013; CT CHEST W/CM dated 04/08/2011  FINDINGS No pathologically enlarged mediastinal, hilar or axillary lymph nodes. Surgical clips in the right axilla. Heart is at the upper limits of normal in size. No pericardial effusion. Moderate hiatal hernia.  Mild biapical pleural parenchymal scarring with postoperative changes in the left upper lobe. Linear scarring in the right middle lobe and lingula as well as right lower lobe. No pleural fluid. Airway is unremarkable.  Incidental imaging of the upper abdomen shows a 2.5 cm low-attenuation lesion in the right hepatic lobe, unchanged from 04/08/2011 and indicative of a cyst. Visualized portions of the liver, adrenal glands, kidneys, spleen, pancreas, stomach and bowel are otherwise grossly unremarkable. No upper abdominal adenopathy. No worrisome lytic or sclerotic lesions. Degenerative changes are seen in the spine.  IMPRESSION Postoperative changes in the right breast, right axilla and left upper lobe, without evidence of recurrent or metastatic disease.  SIGNATURE   Electronically Signed   By: MLorin PicketM.D.   On: 12/29/2013 15:59    ASSESSMENT AND PLAN: this is a very pleasant 73years old white female with history of metastatic non-small cell lung cancer, adenocarcinoma currently on treatment with Tarceva for the last 36 months with no evidence for disease progression. Her recent scan showed no evidence for disease recurrence. I discussed the scan results with the patient and her daughter. I recommended for the patient to continue her current treatment with Tarceva with the same dose.  For the history of the breast cancer, the patient will continue on Femara 2.5 mg by mouth daily. She would come back for follow up visit in one month for reevaluation. The patient voices understanding of current disease status and treatment options and is in agreement with the current care plan.  All questions were answered. The patient knows to call the clinic with any problems, questions or concerns. We can certainly see the patient much sooner if necessary.  Disclaimer: This note was dictated with voice recognition software. Similar sounding words can inadvertently be transcribed and may not be corrected upon review.

## 2014-01-14 ENCOUNTER — Encounter: Payer: Self-pay | Admitting: Nurse Practitioner

## 2014-01-14 ENCOUNTER — Ambulatory Visit (INDEPENDENT_AMBULATORY_CARE_PROVIDER_SITE_OTHER): Payer: Medicare Other | Admitting: Nurse Practitioner

## 2014-01-14 VITALS — BP 161/64 | HR 58 | Ht 68.0 in | Wt 147.0 lb

## 2014-01-14 DIAGNOSIS — I639 Cerebral infarction, unspecified: Secondary | ICD-10-CM

## 2014-01-14 DIAGNOSIS — I6350 Cerebral infarction due to unspecified occlusion or stenosis of unspecified cerebral artery: Secondary | ICD-10-CM

## 2014-01-14 MED ORDER — CLOPIDOGREL BISULFATE 75 MG PO TABS: 75.0000 mg | ORAL_TABLET | Freq: Every day | ORAL | Status: DC

## 2014-01-14 NOTE — Progress Notes (Signed)
PATIENT: Kristin [name] DOB: 06/15/1941  REASON FOR VISIT: stroke follow up HISTORY FROM: patient  HISTORY OF PRESENT ILLNESS: Kristin [name] is a 73 year old right-handed Caucasian female who experienced left arm, and leg weakness,with numbness around her mouth. Family took her to [facility] ER from their home in [place], [place]. By the time she arrived at the hospital, most of the symptoms had resolved. She was admitted for workup. 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 After discharge the next day, she experienced transient left peripheral vision which resolved within hours. All of her weakness and numbness has resolved. Patient expresses frustration about short term memory problems since stroke, feeling overwhelmed, and emotional, which is not her normal.   UPDATE 07/20/13 (LL): Kristin [name] returns for stroke follow up visit and reports that she is doing very well. Her memory problems have resolved and she has no neurological deficits. She did increase her Lexapro to 15 mg for a few weeks but then went back to 10 mg and feels fine. She is still taking aspirin and Plavix daily and has mild bruising. BP, HgbA1c and cholesterol are at goal.  UPDATE 01/14/14 (LL):  Kristin [name] returns for stroke follow up.  She is doing well, no repeat neurovascular symptoms. She is still taking Plavix daily and has mild bruising. BP and cholesterol are at goal.  She complains only of itchy, red eyelids with crusting, looks like Blepharitis.  I urged her to call her eye doctor for treatment.   REVIEW OF SYSTEMS: Full 14 system review of systems performed and notable only for:  Eye discharge, eye itching, eye redness, light sensitivity  ALLERGIES: Allergies  Allergen Reactions  . Sulfa Antibiotics Rash    HOME MEDICATIONS: Outpatient Prescriptions Prior to Visit  Medication Sig Dispense Refill  . amLODipine (NORVASC) 5 MG tablet Take 5 mg by mouth daily.        . brimonidine-timolol (COMBIGAN)  0.2-0.5 % ophthalmic solution Place 1 drop into the right eye every 12 (twelve) hours.      . Calcium Carb-Cholecalciferol (RA CALCIUM 600/VITAMIN D-3) 600-400 MG-UNIT TABS Take 2 tablets by mouth daily.        . clindamycin (CLEOCIN T) 1 % lotion Apply topically 2 (two) times daily.  60 mL  2  . cycloSPORINE (RESTASIS) 0.05 % ophthalmic emulsion Place 1 drop into both eyes 2 (two) times daily.      Marland Kitchen erlotinib (TARCEVA) 150 MG tablet Take 1 tablet (150 mg total) by mouth daily.  30 tablet  2  . escitalopram (LEXAPRO) 10 MG tablet Take 15 mg by mouth daily. Takes 5mg  and 10mg  tablet daily = 15mg  daily      . escitalopram (LEXAPRO) 5 mg TABS tablet Take 5 mg by mouth. Takes 5mg  and 10mg  tablet daily = 15mg  daily      . famotidine (PEPCID) 40 MG tablet Take 40 mg by mouth daily.        Marland Kitchen FeFum-FePoly-FA-B Cmp-C-Biot (FOLIVANE-PLUS) CAPS TAKE 1 CAPSULE BY MOUTH DAILY  90 capsule  1  . letrozole (FEMARA) 2.5 MG tablet TAKE ONE TABLET EACH DAY  30 tablet  5  . lisinopril (PRINIVIL,ZESTRIL) 10 MG tablet 20 mg.       . zolpidem (AMBIEN) 5 MG tablet at bedtime as needed.       . clopidogrel (PLAVIX) 75 MG tablet Take 1 tablet (75 mg total) by mouth daily with breakfast.  90 tablet  3   No facility-administered medications  prior to visit.     PHYSICAL EXAM  Filed Vitals:   01/14/14 1433  BP: 161/64  Pulse: 58  Height: 5\' 8"  (1.727 m)  Weight: 147 lb (66.679 kg)   Body mass index is 22.36 kg/(m^2).  Generalized: In no acute distress, pleasant elderly Caucasian female, well developed, well groomed.  Neck: Supple, no carotid bruits  Cardiac: Regular rate rhythm, no murmur  Pulmonary: Clear to auscultation bilaterally  Musculoskeletal: No deformity   Neurological examination  Mentation: Alert oriented to time, place, history taking, language fluent, and casual conversation.  Cranial nerve II-XII: Pupils were equal round reactive to light extraocular movements were full, visual field were full  on confrontational test. facial sensation and strength were normal. hearing was intact to finger rubbing bilaterally. Uvula tongue midline. head turning and shoulder shrug and were normal and symmetric.Tongue protrusion into cheek strength was normal.  MOTOR: normal bulk and tone, full strength in the BUE, BLE, fine finger movements normal, no pronator drift  SENSORY: normal and symmetric to light touch, pinprick, temperature, vibration and proprioception  COORDINATION: finger-nose-finger, heel-to-shin normal bilaterally, there was no truncal ataxia  REFLEXES: Brachioradialis 2/2, biceps 2/2, triceps 2/2, patellar 2/2, Achilles 2/2, plantar responses were flexor bilaterally.  GAIT/STATION: Rising up from seated position without assistance, normal stance, without trunk ataxia, moderate stride, good arm swing, smooth turning, able to perform tiptoe, and heel walking without difficulty.  Tandem unsteady. Romberg negative.  ASSESSMENT AND PLAN 73 YO female with acute right occipital lobe infarct. History of cancer on Femara and Tarceva. Patient physically back to her baseline at this time. Self-reported short-term memory problems have resolved. Etiology of stroke still unclear.   PLAN:  Continue clopidogrel 75 mg orally every day for secondary stroke prevention and maintain strict control of hypertension with blood pressure goal below 130/90, diabetes with hemoglobin A1c goal below 6.5% and lipids with LDL cholesterol goal below 100 mg/dL.   Continue Lexapro 10 mg daily.  Kristin [name] would like to follow up in the future with her PCP in [place], [place], because our office is 100 mile round trip. Agreed.  Meds ordered this encounter  Medications  . clopidogrel (PLAVIX) 75 MG tablet    Sig: Take 1 tablet (75 mg total) by mouth daily with breakfast.    Dispense:  90 tablet    Refill:  3    Order Specific Question:  Supervising Provider    Answer:  Garvin Fila [[id number]]   Return in about 6 months  (around 07/17/2014).  Philmore Pali, MSN, NP-C 01/14/2014, 3:02 PM [facility] [address] [place], [place] [address] [phone]  Note: This document was prepared with digital dictation and possible smart phrase technology. Any transcriptional errors that result from this process are unintentional.

## 2014-01-14 NOTE — Patient Instructions (Signed)
PLAN:  Continue clopidogrel 75 mg orally every day for secondary stroke prevention and maintain strict control of hypertension with blood pressure goal below 130/90, diabetes with hemoglobin A1c goal below 6.5% and lipids with LDL cholesterol goal below 100 mg/dL.   Continue Lexapro 10 mg daily.  Return in 6 months.

## 2014-01-17 ENCOUNTER — Ambulatory Visit: Payer: Medicare Other | Admitting: Nurse Practitioner

## 2014-01-19 ENCOUNTER — Telehealth: Payer: Self-pay | Admitting: Nurse Practitioner

## 2014-01-19 ENCOUNTER — Other Ambulatory Visit: Payer: Self-pay | Admitting: *Deleted

## 2014-01-19 ENCOUNTER — Other Ambulatory Visit: Payer: Self-pay | Admitting: Internal Medicine

## 2014-01-19 MED ORDER — CLOPIDOGREL BISULFATE 75 MG PO TABS: 75.0000 mg | ORAL_TABLET | Freq: Every day | ORAL | Status: DC

## 2014-01-19 NOTE — Telephone Encounter (Signed)
Kristin [name] @ [facility] needs to know if Rx for Clopidogrel they received was supposed to come to their pharmacy or patient's local pharmacy-please call-thank you.

## 2014-01-19 NOTE — Telephone Encounter (Signed)
Patient has several different pharmacies listed in her chart.  I called to verify which one she wants Rx sent to.  She said [facility] for [facility].  Prescription has been resent to them.  [facility] has been advised to cancel the Rx that was sent to them.

## 2014-01-20 NOTE — Telephone Encounter (Signed)
RECEIVED A FAX FROM [facility] CONCERNING A CONFIRMATION OF PT. REFERRAL.

## 2014-01-20 NOTE — Telephone Encounter (Signed)
RECEIVED A FAX FROM [facility] CONCERNING A CONFIRMATION OF A PRESCRIPTION SHIPMENT FOR [place] ON 01/20/14.

## 2014-01-27 ENCOUNTER — Other Ambulatory Visit: Payer: Medicare Other

## 2014-02-01 ENCOUNTER — Encounter: Payer: Self-pay | Admitting: Internal Medicine

## 2014-02-01 ENCOUNTER — Telehealth: Payer: Self-pay | Admitting: Internal Medicine

## 2014-02-01 ENCOUNTER — Ambulatory Visit (HOSPITAL_BASED_OUTPATIENT_CLINIC_OR_DEPARTMENT_OTHER): Payer: Medicare Other | Admitting: Internal Medicine

## 2014-02-01 ENCOUNTER — Other Ambulatory Visit (HOSPITAL_BASED_OUTPATIENT_CLINIC_OR_DEPARTMENT_OTHER): Payer: Medicare Other

## 2014-02-01 VITALS — BP 152/54 | HR 51 | Temp 97.7°F | Resp 18 | Ht 68.0 in | Wt 144.7 lb

## 2014-02-01 DIAGNOSIS — C3410 Malignant neoplasm of upper lobe, unspecified bronchus or lung: Secondary | ICD-10-CM

## 2014-02-01 DIAGNOSIS — C50919 Malignant neoplasm of unspecified site of unspecified female breast: Secondary | ICD-10-CM

## 2014-02-01 DIAGNOSIS — R21 Rash and other nonspecific skin eruption: Secondary | ICD-10-CM

## 2014-02-01 LAB — CBC WITH DIFFERENTIAL/PLATELET
BASO%: 1.1 % (ref 0.0–2.0)
BASOS ABS: 0.1 10*3/uL (ref 0.0–0.1)
EOS%: 5.5 % (ref 0.0–7.0)
Eosinophils Absolute: 0.3 10*3/uL (ref 0.0–0.5)
HEMATOCRIT: 34 % — AB (ref 34.8–46.6)
HEMOGLOBIN: 11.1 g/dL — AB (ref 11.6–15.9)
LYMPH#: 1.1 10*3/uL (ref 0.9–3.3)
LYMPH%: 19.7 % (ref 14.0–49.7)
MCH: 29.5 pg (ref 25.1–34.0)
MCHC: 32.5 g/dL (ref 31.5–36.0)
MCV: 90.6 fL (ref 79.5–101.0)
MONO#: 0.8 10*3/uL (ref 0.1–0.9)
MONO%: 14.3 % — AB (ref 0.0–14.0)
NEUT%: 59.4 % (ref 38.4–76.8)
NEUTROS ABS: 3.3 10*3/uL (ref 1.5–6.5)
Platelets: 384 10*3/uL (ref 145–400)
RBC: 3.76 10*6/uL (ref 3.70–5.45)
RDW: 14.3 % (ref 11.2–14.5)
WBC: 5.5 10*3/uL (ref 3.9–10.3)

## 2014-02-01 LAB — COMPREHENSIVE METABOLIC PANEL (CC13)
ALBUMIN: 3.4 g/dL — AB (ref 3.5–5.0)
ALT: 10 U/L (ref 0–55)
AST: 17 U/L (ref 5–34)
Alkaline Phosphatase: 86 U/L (ref 40–150)
Anion Gap: 8 mEq/L (ref 3–11)
BUN: 18.5 mg/dL (ref 7.0–26.0)
CALCIUM: 9.6 mg/dL (ref 8.4–10.4)
CO2: 28 mEq/L (ref 22–29)
Chloride: 106 mEq/L (ref 98–109)
Creatinine: 0.9 mg/dL (ref 0.6–1.1)
Glucose: 102 mg/dl (ref 70–140)
POTASSIUM: 4.3 meq/L (ref 3.5–5.1)
Sodium: 142 mEq/L (ref 136–145)
Total Bilirubin: 0.63 mg/dL (ref 0.20–1.20)
Total Protein: 6.6 g/dL (ref 6.4–8.3)

## 2014-02-01 NOTE — Telephone Encounter (Signed)
gave pt appt for lab and Md for May 2015

## 2014-02-01 NOTE — Progress Notes (Signed)
[facility] [phone]   Fax:([phone]  OFFICE PROGRESS NOTE  Eber Hong, MD [address] 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  2) history of breast cancer.   Prior Therapy: None.   Current therapy:  1) Tarceva at 150 mg by mouth daily status post 37 months therapy.  2) Femara 2.5 mg by mouth daily.   CHEMOTHERAPY INTENT: palliative  CURRENT # OF CHEMOTHERAPY CYCLES: 38 CURRENT ANTIEMETICS: Compazine when necessary  CURRENT SMOKING STATUS: Never smoker  ORAL CHEMOTHERAPY AND CONSENT: Tarceva and Femara.  CURRENT BISPHOSPHONATES USE: None  PAIN MANAGEMENT: None  NARCOTICS INDUCED CONSTIPATION: None  LIVING WILL AND CODE STATUS: Full code initially but no prolonged resuscitation.   INTERVAL HISTORY: Kristin [name] 73 y.o. female returns to the clinic today for follow up visit. The patient is feeling fine today with no specific complaints. She has mild skin rash mainly on the legs. She denied having any significant diarrhea. The patient denied having any chest pain, shortness of breath, cough or hemoptysis. She is tolerating her treatment with Tarceva and Femara fairly well except for the skin rash. She denied having any significant nausea or vomiting, no fever or chills. She has no weight loss or night sweats.   MEDICAL HISTORY: Past Medical History  Diagnosis Date  . Hypertension   . Anemia   . Breast cancer 2002  . Lung cancer dx'd 12/2010  . Stroke     ALLERGIES:  is allergic to sulfa antibiotics.  MEDICATIONS:  Current Outpatient Prescriptions  Medication Sig Dispense Refill  . amLODipine (NORVASC) 5 MG tablet Take 5 mg by mouth daily.        . brimonidine-timolol (COMBIGAN) 0.2-0.5 % ophthalmic solution Place 1 drop into the right eye every 12 (twelve) hours.      . Calcium  Carb-Cholecalciferol (RA CALCIUM 600/VITAMIN D-3) 600-400 MG-UNIT TABS Take 2 tablets by mouth daily.        . clindamycin (CLEOCIN T) 1 % lotion Apply topically 2 (two) times daily.  60 mL  2  . clopidogrel (PLAVIX) 75 MG tablet Take 1 tablet (75 mg total) by mouth daily with breakfast.  90 tablet  3  . cycloSPORINE (RESTASIS) 0.05 % ophthalmic emulsion Place 1 drop into both eyes 2 (two) times daily.      Marland Kitchen escitalopram (LEXAPRO) 10 MG tablet Take 15 mg by mouth daily. Takes 53m and 117mtablet daily = 1533maily      . escitalopram (LEXAPRO) 5 mg TABS tablet Take 5 mg by mouth. Takes 5mg82md 10mg48mlet daily = 15mg 20my      . famotidine (PEPCID) 40 MG tablet Take 40 mg by mouth daily.        . FeFuMarland Kitchen-FePoly-FA-B Cmp-C-Biot (FOLIVANE-PLUS) CAPS TAKE 1 CAPSULE BY MOUTH DAILY  90 capsule  1  . letrozole (FEMARA) 2.5 MG tablet TAKE ONE TABLET EACH DAY  30 tablet  5  . lisinopril (PRINIVIL,ZESTRIL) 10 MG tablet 20 mg.       . TARCEVA 150 MG tablet TAKE ONCE TABLET BY MOUTH ONCE DAILY.  30 tablet  0  . zolpidem (AMBIEN) 5 MG tablet at bedtime as needed.       . [DISCONTINUED] erlotinib (TARCEVA) 150 MG tablet Take 1 tablet (150 mg total) by mouth daily.  30 tablet  2   No  current facility-administered medications for this visit.    SURGICAL HISTORY:  Past Surgical History  Procedure Laterality Date  . Appendectomy    . Mastectomy      bilateral  . Lung surgery    . Cataracts      bilateral  . Colonoscopy    . Tonsillectomy    . Tee without cardioversion N/A 06/04/2013    Procedure: TRANSESOPHAGEAL ECHOCARDIOGRAM (TEE);  Surgeon: Jolaine Artist, MD;  Location: [facility] ENDOSCOPY;  Service: Cardiovascular;  Laterality: N/A;    REVIEW OF SYSTEMS:  Constitutional: negative Eyes: negative Ears, nose, mouth, throat, and face: negative Respiratory: negative Cardiovascular: negative Gastrointestinal: negative Genitourinary:negative Integument/breast: positive for  rash Hematologic/lymphatic: negative Musculoskeletal:negative Neurological: negative Behavioral/Psych: negative Endocrine: negative Allergic/Immunologic: negative   PHYSICAL EXAMINATION: General appearance: alert, cooperative and no distress Head: Normocephalic, without obvious abnormality, atraumatic Neck: no adenopathy, no JVD and thyroid not enlarged, symmetric, no tenderness/mass/nodules Lymph nodes: Cervical, supraclavicular, and axillary nodes normal. Resp: clear to auscultation bilaterally and normal percussion bilaterally Back: symmetric, no curvature. ROM normal. No CVA tenderness. Cardio: regular rate and rhythm, S1, S2 normal, no murmur, click, rub or gallop GI: soft, non-tender; bowel sounds normal; no masses,  no organomegaly Extremities: extremities normal, atraumatic, no cyanosis or edema and Grade 2 skin rash on the lower extremities Neurologic: Alert and oriented X 3, normal strength and tone. Normal symmetric reflexes. Normal coordination and gait  ECOG PERFORMANCE STATUS: 1 - Symptomatic but completely ambulatory  Blood pressure 152/54, pulse 51, temperature 97.7 F (36.5 C), temperature source Oral, resp. rate 18, height _0  (1.727 m), weight 144 lb 11.2 oz (65.635 kg), SpO2 100.00%.  LABORATORY DATA: Lab Results  Component Value Date   WBC 5.5 02/01/2014   HGB 11.1* 02/01/2014   HCT 34.0* 02/01/2014   MCV 90.6 02/01/2014   PLT 384 02/01/2014      Chemistry      Component Value Date/Time   NA 142 02/01/2014 0913   NA 139 04/30/2013 0430   NA 147* 03/26/2012 1022   K 4.3 02/01/2014 0913   K 4.1 04/30/2013 0430   K 4.8* 03/26/2012 1022   CL 105 04/30/2013 0430   CL 107 04/05/2013 0754   CL 102 03/26/2012 1022   CO2 28 02/01/2014 0913   CO2 27 04/30/2013 0430   CO2 31 03/26/2012 1022   BUN 18.5 02/01/2014 0913   BUN 14 04/30/2013 0430   BUN 14 03/26/2012 1022   CREATININE 0.9 02/01/2014 0913   CREATININE 0.81 04/30/2013 0430   CREATININE 0.7 03/26/2012 1022       Component Value Date/Time   CALCIUM 9.6 02/01/2014 0913   CALCIUM 9.3 04/30/2013 0430   CALCIUM 9.1 03/26/2012 1022   ALKPHOS 86 02/01/2014 0913   ALKPHOS 73 04/30/2013 0430   ALKPHOS 84 03/26/2012 1022   AST 17 02/01/2014 0913   AST 23 04/30/2013 0430   AST 26 03/26/2012 1022   ALT 10 02/01/2014 0913   ALT 15 04/30/2013 0430   ALT 20 03/26/2012 1022   BILITOT 0.63 02/01/2014 0913   BILITOT 0.3 04/30/2013 0430   BILITOT 0.70 03/26/2012 1022        RADIOGRAPHIC STUDIES:   ASSESSMENT AND PLAN: this is a very pleasant 73 years old white female with history of metastatic non-small cell lung cancer, adenocarcinoma currently on treatment with Tarceva for the last 37 months with no evidence for disease progression. I recommended for the patient to continue her current treatment with Tarceva  with the same dose.  For the history of the breast cancer, the patient will continue on Femara 2.5 mg by mouth daily. She would come back for follow up visit in one month for reevaluation. The patient voices understanding of current disease status and treatment options and is in agreement with the current care plan.  All questions were answered. The patient knows to call the clinic with any problems, questions or concerns. We can certainly see the patient much sooner if necessary.  Disclaimer: This note was dictated with voice recognition software. Similar sounding words can inadvertently be transcribed and may not be corrected upon review.

## 2014-03-03 ENCOUNTER — Telehealth: Payer: Self-pay | Admitting: Internal Medicine

## 2014-03-03 ENCOUNTER — Encounter: Payer: Self-pay | Admitting: Internal Medicine

## 2014-03-03 ENCOUNTER — Ambulatory Visit (HOSPITAL_BASED_OUTPATIENT_CLINIC_OR_DEPARTMENT_OTHER): Payer: Medicare Other | Admitting: Internal Medicine

## 2014-03-03 ENCOUNTER — Other Ambulatory Visit (HOSPITAL_BASED_OUTPATIENT_CLINIC_OR_DEPARTMENT_OTHER): Payer: Medicare Other

## 2014-03-03 VITALS — BP 171/50 | HR 55 | Temp 97.6°F | Resp 18 | Ht 68.0 in | Wt 145.2 lb

## 2014-03-03 DIAGNOSIS — C50919 Malignant neoplasm of unspecified site of unspecified female breast: Secondary | ICD-10-CM

## 2014-03-03 DIAGNOSIS — C3410 Malignant neoplasm of upper lobe, unspecified bronchus or lung: Secondary | ICD-10-CM

## 2014-03-03 DIAGNOSIS — R21 Rash and other nonspecific skin eruption: Secondary | ICD-10-CM

## 2014-03-03 LAB — CBC WITH DIFFERENTIAL/PLATELET
BASO%: 1.3 % (ref 0.0–2.0)
Basophils Absolute: 0.1 10*3/uL (ref 0.0–0.1)
EOS ABS: 0.3 10*3/uL (ref 0.0–0.5)
EOS%: 5.4 % (ref 0.0–7.0)
HCT: 35.5 % (ref 34.8–46.6)
HGB: 11.7 g/dL (ref 11.6–15.9)
LYMPH#: 1.1 10*3/uL (ref 0.9–3.3)
LYMPH%: 16.9 % (ref 14.0–49.7)
MCH: 29.7 pg (ref 25.1–34.0)
MCHC: 32.9 g/dL (ref 31.5–36.0)
MCV: 90.2 fL (ref 79.5–101.0)
MONO#: 0.7 10*3/uL (ref 0.1–0.9)
MONO%: 10.7 % (ref 0.0–14.0)
NEUT%: 65.7 % (ref 38.4–76.8)
NEUTROS ABS: 4.3 10*3/uL (ref 1.5–6.5)
Platelets: 455 10*3/uL — ABNORMAL HIGH (ref 145–400)
RBC: 3.94 10*6/uL (ref 3.70–5.45)
RDW: 14 % (ref 11.2–14.5)
WBC: 6.5 10*3/uL (ref 3.9–10.3)

## 2014-03-03 LAB — COMPREHENSIVE METABOLIC PANEL (CC13)
ALBUMIN: 3.4 g/dL — AB (ref 3.5–5.0)
ALT: 15 U/L (ref 0–55)
AST: 19 U/L (ref 5–34)
Alkaline Phosphatase: 78 U/L (ref 40–150)
Anion Gap: 9 mEq/L (ref 3–11)
BUN: 17.9 mg/dL (ref 7.0–26.0)
CALCIUM: 10 mg/dL (ref 8.4–10.4)
CO2: 26 mEq/L (ref 22–29)
Chloride: 105 mEq/L (ref 98–109)
Creatinine: 1 mg/dL (ref 0.6–1.1)
GLUCOSE: 106 mg/dL (ref 70–140)
POTASSIUM: 4.4 meq/L (ref 3.5–5.1)
Sodium: 141 mEq/L (ref 136–145)
Total Bilirubin: 0.53 mg/dL (ref 0.20–1.20)
Total Protein: 6.6 g/dL (ref 6.4–8.3)

## 2014-03-03 NOTE — Telephone Encounter (Signed)
gave pt appt for lab and MD for June , also gave pt oral contrast

## 2014-03-03 NOTE — Progress Notes (Signed)
[place] [phone]   Fax:(336) [phone]  OFFICE PROGRESS NOTE  Eber Hong, MD [address] [place] [address]  Principle Diagnosis:  1) Metastatic non-small cell lung cancer adenocarcinoma with positive for EGFR mutation at exon 21 diagnosed in March of 2012.  2) history of breast cancer.   Prior Therapy: None.   Current therapy:  1) Tarceva at 150 mg by mouth daily status post 38 months therapy.  2) Femara 2.5 mg by mouth daily.   CHEMOTHERAPY INTENT: palliative  CURRENT # OF CHEMOTHERAPY CYCLES: 39 CURRENT ANTIEMETICS: Compazine when necessary  CURRENT SMOKING STATUS: Never smoker  ORAL CHEMOTHERAPY AND CONSENT: Tarceva and Femara.  CURRENT BISPHOSPHONATES USE: None  PAIN MANAGEMENT: None  NARCOTICS INDUCED CONSTIPATION: None  LIVING WILL AND CODE STATUS: Full code initially but no prolonged resuscitation.   INTERVAL HISTORY: Kristin [name] 73 y.o. female returns to the clinic today for follow up visit. The patient is feeling fine today with no specific complaints except new left hip pain. She took Advil with some improvement. She has mild skin rash mainly on the legs. She denied having any significant diarrhea. The patient denied having any chest pain, shortness of breath, cough or hemoptysis. She is tolerating her treatment with Tarceva and Femara fairly well except for the skin rash. She denied having any significant nausea or vomiting, no fever or chills. She has no weight loss or night sweats.   MEDICAL HISTORY: Past Medical History  Diagnosis Date  . Hypertension   . Anemia   . Breast cancer 2002  . Lung cancer dx'd 12/2010  . Stroke     ALLERGIES:  is allergic to sulfa antibiotics.  MEDICATIONS:  Current Outpatient Prescriptions  Medication Sig Dispense Refill  . amLODipine (NORVASC) 5 MG tablet Take 5 mg by mouth daily.        . brimonidine-timolol (COMBIGAN) 0.2-0.5 % ophthalmic solution Place 1 drop into the  right eye every 12 (twelve) hours.      . Calcium Carb-Cholecalciferol (RA CALCIUM 600/VITAMIN D-3) 600-400 MG-UNIT TABS Take 2 tablets by mouth daily.        . clindamycin (CLEOCIN T) 1 % lotion Apply topically 2 (two) times daily.  60 mL  2  . clopidogrel (PLAVIX) 75 MG tablet Take 1 tablet (75 mg total) by mouth daily with breakfast.  90 tablet  3  . cycloSPORINE (RESTASIS) 0.05 % ophthalmic emulsion Place 1 drop into both eyes 2 (two) times daily.      Marland Kitchen escitalopram (LEXAPRO) 10 MG tablet Take 15 mg by mouth daily. Takes 24m and 154mtablet daily = 157maily      . escitalopram (LEXAPRO) 5 mg TABS tablet Take 5 mg by mouth. Takes 5mg15md 10mg25mlet daily = 15mg 59my      . famotidine (PEPCID) 40 MG tablet Take 40 mg by mouth daily.        . FeFuMarland Kitchen-FePoly-FA-B Cmp-C-Biot (FOLIVANE-PLUS) CAPS TAKE 1 CAPSULE BY MOUTH DAILY  90 capsule  1  . letrozole (FEMARA) 2.5 MG tablet TAKE ONE TABLET EACH DAY  30 tablet  5  . lisinopril (PRINIVIL,ZESTRIL) 10 MG tablet 20 mg.       . TARCEVA 150 MG tablet TAKE ONCE TABLET BY MOUTH ONCE DAILY.  30 tablet  0  . zolpidem (AMBIEN) 5 MG tablet at bedtime as needed.       . [DISCONTINUED] erlotinib (TARCEVA) 150 MG tablet Take 1 tablet (150 mg total)  by mouth daily.  30 tablet  2   No current facility-administered medications for this visit.    SURGICAL HISTORY:  Past Surgical History  Procedure Laterality Date  . Appendectomy    . Mastectomy      bilateral  . Lung surgery    . Cataracts      bilateral  . Colonoscopy    . Tonsillectomy    . Tee without cardioversion N/A 06/04/2013    Procedure: TRANSESOPHAGEAL ECHOCARDIOGRAM (TEE);  Surgeon: Jolaine Artist, MD;  Location: [facility] ENDOSCOPY;  Service: Cardiovascular;  Laterality: N/A;    REVIEW OF SYSTEMS:  Integument/breast: positive for rash Musculoskeletal:positive for bone pain   PHYSICAL EXAMINATION: General appearance: alert, cooperative and no distress Head: Normocephalic, without obvious  abnormality, atraumatic Neck: no adenopathy, no JVD and thyroid not enlarged, symmetric, no tenderness/mass/nodules Lymph nodes: Cervical, supraclavicular, and axillary nodes normal. Resp: clear to auscultation bilaterally and normal percussion bilaterally Back: symmetric, no curvature. ROM normal. No CVA tenderness. Cardio: regular rate and rhythm, S1, S2 normal, no murmur, click, rub or gallop GI: soft, non-tender; bowel sounds normal; no masses,  no organomegaly Extremities: extremities normal, atraumatic, no cyanosis or edema and Grade 2 skin rash on the lower extremities Neurologic: Alert and oriented X 3, normal strength and tone. Normal symmetric reflexes. Normal coordination and gait  ECOG PERFORMANCE STATUS: 1 - Symptomatic but completely ambulatory  Blood pressure 171/50, pulse 55, temperature 97.6 F (36.4 C), temperature source Oral, resp. rate 18, height 5' 8"  (1.727 m), weight 145 lb 3.2 oz (65.862 kg), SpO2 99.00%.  LABORATORY DATA: Lab Results  Component Value Date   WBC 6.5 03/03/2014   HGB 11.7 03/03/2014   HCT 35.5 03/03/2014   MCV 90.2 03/03/2014   PLT 455* 03/03/2014      Chemistry      Component Value Date/Time   NA 141 03/03/2014 0921   NA 139 04/30/2013 0430   NA 147* 03/26/2012 1022   K 4.4 03/03/2014 0921   K 4.1 04/30/2013 0430   K 4.8* 03/26/2012 1022   CL 105 04/30/2013 0430   CL 107 04/05/2013 0754   CL 102 03/26/2012 1022   CO2 26 03/03/2014 0921   CO2 27 04/30/2013 0430   CO2 31 03/26/2012 1022   BUN 17.9 03/03/2014 0921   BUN 14 04/30/2013 0430   BUN 14 03/26/2012 1022   CREATININE 1.0 03/03/2014 0921   CREATININE 0.81 04/30/2013 0430   CREATININE 0.7 03/26/2012 1022      Component Value Date/Time   CALCIUM 10.0 03/03/2014 0921   CALCIUM 9.3 04/30/2013 0430   CALCIUM 9.1 03/26/2012 1022   ALKPHOS 78 03/03/2014 0921   ALKPHOS 73 04/30/2013 0430   ALKPHOS 84 03/26/2012 1022   AST 19 03/03/2014 0921   AST 23 04/30/2013 0430   AST 26 03/26/2012 1022   ALT 15 03/03/2014  0921   ALT 15 04/30/2013 0430   ALT 20 03/26/2012 1022   BILITOT 0.53 03/03/2014 0921   BILITOT 0.3 04/30/2013 0430   BILITOT 0.70 03/26/2012 1022        RADIOGRAPHIC STUDIES:   ASSESSMENT AND PLAN: this is a very pleasant 73 years old white female with history of metastatic non-small cell lung cancer, adenocarcinoma currently on treatment with Tarceva for the last 38 months with no evidence for disease progression. I recommended for the patient to continue her current treatment with Tarceva with the same dose.  For the history of the breast cancer, the patient  will continue on Femara 2.5 mg by mouth daily. She would come back for follow up visit in one month for reevaluation after repeating CT scan of the chest, abdomen and pelvis for restaging of her disease and evaluation of the left hip pain. The patient voices understanding of current disease status and treatment options and is in agreement with the current care plan.  All questions were answered. The patient knows to call the clinic with any problems, questions or concerns. We can certainly see the patient much sooner if necessary.  Disclaimer: This note was dictated with voice recognition software. Similar sounding words can inadvertently be transcribed and may not be corrected upon review.

## 2014-03-23 ENCOUNTER — Other Ambulatory Visit: Payer: Self-pay | Admitting: Medical Oncology

## 2014-03-23 DIAGNOSIS — C3490 Malignant neoplasm of unspecified part of unspecified bronchus or lung: Secondary | ICD-10-CM

## 2014-03-23 MED ORDER — ERLOTINIB HCL 150 MG PO TABS: 150.0000 mg | ORAL_TABLET | Freq: Every day | ORAL | Status: DC

## 2014-03-23 NOTE — Telephone Encounter (Signed)
Tarceva refill sent to [facility] for authorization.

## 2014-03-30 ENCOUNTER — Encounter: Payer: Self-pay | Admitting: Internal Medicine

## 2014-03-30 NOTE — Progress Notes (Signed)
Per PAN  Tarceva approved for 7500.00  02/28/14-02/1015. I will let billing know and send to medical recrds.

## 2014-04-01 ENCOUNTER — Encounter (HOSPITAL_COMMUNITY): Payer: Self-pay

## 2014-04-01 ENCOUNTER — Other Ambulatory Visit (HOSPITAL_BASED_OUTPATIENT_CLINIC_OR_DEPARTMENT_OTHER): Payer: Medicare Other

## 2014-04-01 ENCOUNTER — Ambulatory Visit (HOSPITAL_COMMUNITY)
Admission: RE | Admit: 2014-04-01 | Discharge: 2014-04-01 | Disposition: A | Discharge status: Home / Self Care | Payer: Medicare Other | Source: Ambulatory Visit | Attending: Internal Medicine | Admitting: Internal Medicine

## 2014-04-01 DIAGNOSIS — M51379 Other intervertebral disc degeneration, lumbosacral region without mention of lumbar back pain or lower extremity pain: Secondary | ICD-10-CM | POA: Insufficient documentation

## 2014-04-01 DIAGNOSIS — C3410 Malignant neoplasm of upper lobe, unspecified bronchus or lung: Secondary | ICD-10-CM

## 2014-04-01 DIAGNOSIS — C3490 Malignant neoplasm of unspecified part of unspecified bronchus or lung: Secondary | ICD-10-CM | POA: Insufficient documentation

## 2014-04-01 DIAGNOSIS — D259 Leiomyoma of uterus, unspecified: Secondary | ICD-10-CM | POA: Insufficient documentation

## 2014-04-01 DIAGNOSIS — I700 Atherosclerosis of aorta: Secondary | ICD-10-CM | POA: Insufficient documentation

## 2014-04-01 DIAGNOSIS — K7689 Other specified diseases of liver: Secondary | ICD-10-CM | POA: Insufficient documentation

## 2014-04-01 DIAGNOSIS — M5137 Other intervertebral disc degeneration, lumbosacral region: Secondary | ICD-10-CM | POA: Insufficient documentation

## 2014-04-01 DIAGNOSIS — C50919 Malignant neoplasm of unspecified site of unspecified female breast: Secondary | ICD-10-CM | POA: Insufficient documentation

## 2014-04-01 DIAGNOSIS — M479 Spondylosis, unspecified: Secondary | ICD-10-CM | POA: Insufficient documentation

## 2014-04-01 LAB — COMPREHENSIVE METABOLIC PANEL (CC13)
ALBUMIN: 3.4 g/dL — AB (ref 3.5–5.0)
ALK PHOS: 87 U/L (ref 40–150)
ALT: 13 U/L (ref 0–55)
AST: 19 U/L (ref 5–34)
Anion Gap: 7 mEq/L (ref 3–11)
BILIRUBIN TOTAL: 0.78 mg/dL (ref 0.20–1.20)
BUN: 18.7 mg/dL (ref 7.0–26.0)
CO2: 27 mEq/L (ref 22–29)
Calcium: 9.7 mg/dL (ref 8.4–10.4)
Chloride: 106 mEq/L (ref 98–109)
Creatinine: 1 mg/dL (ref 0.6–1.1)
Glucose: 84 mg/dl (ref 70–140)
Potassium: 4.5 mEq/L (ref 3.5–5.1)
Sodium: 140 mEq/L (ref 136–145)
TOTAL PROTEIN: 6.7 g/dL (ref 6.4–8.3)

## 2014-04-01 LAB — CBC WITH DIFFERENTIAL/PLATELET
BASO%: 1 % (ref 0.0–2.0)
Basophils Absolute: 0.1 10*3/uL (ref 0.0–0.1)
EOS%: 4.9 % (ref 0.0–7.0)
Eosinophils Absolute: 0.3 10*3/uL (ref 0.0–0.5)
HCT: 33.9 % — ABNORMAL LOW (ref 34.8–46.6)
HGB: 10.9 g/dL — ABNORMAL LOW (ref 11.6–15.9)
LYMPH%: 15.3 % (ref 14.0–49.7)
MCH: 29.1 pg (ref 25.1–34.0)
MCHC: 32.2 g/dL (ref 31.5–36.0)
MCV: 90.6 fL (ref 79.5–101.0)
MONO#: 0.8 10*3/uL (ref 0.1–0.9)
MONO%: 12.6 % (ref 0.0–14.0)
NEUT#: 3.9 10*3/uL (ref 1.5–6.5)
NEUT%: 66.2 % (ref 38.4–76.8)
PLATELETS: 422 10*3/uL — AB (ref 145–400)
RBC: 3.74 10*6/uL (ref 3.70–5.45)
RDW: 14.2 % (ref 11.2–14.5)
WBC: 5.9 10*3/uL (ref 3.9–10.3)
lymph#: 0.9 10*3/uL (ref 0.9–3.3)

## 2014-04-01 MED ORDER — IOHEXOL 300 MG/ML  SOLN
100.0000 mL | Freq: Once | INTRAMUSCULAR | Status: AC | PRN
  Administered 2014-04-01: 100 mL via INTRAVENOUS

## 2014-04-04 ENCOUNTER — Encounter: Payer: Self-pay | Admitting: Internal Medicine

## 2014-04-04 ENCOUNTER — Ambulatory Visit (HOSPITAL_BASED_OUTPATIENT_CLINIC_OR_DEPARTMENT_OTHER): Payer: Medicare Other | Admitting: Internal Medicine

## 2014-04-04 ENCOUNTER — Telehealth: Payer: Self-pay | Admitting: Internal Medicine

## 2014-04-04 VITALS — BP 138/60 | HR 55 | Temp 97.8°F | Resp 18 | Ht 68.0 in | Wt 145.5 lb

## 2014-04-04 DIAGNOSIS — R21 Rash and other nonspecific skin eruption: Secondary | ICD-10-CM

## 2014-04-04 DIAGNOSIS — C3410 Malignant neoplasm of upper lobe, unspecified bronchus or lung: Secondary | ICD-10-CM

## 2014-04-04 DIAGNOSIS — M25559 Pain in unspecified hip: Secondary | ICD-10-CM

## 2014-04-04 DIAGNOSIS — C50919 Malignant neoplasm of unspecified site of unspecified female breast: Secondary | ICD-10-CM

## 2014-04-04 MED ORDER — METHYLPREDNISOLONE (PAK) 4 MG PO TABS: ORAL_TABLET | ORAL | Status: DC

## 2014-04-04 NOTE — Progress Notes (Signed)
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  2) history of breast cancer.   Prior Therapy: None.   Current therapy:  1) Tarceva at 150 mg by mouth daily status post 39 months therapy.  2) Femara 2.5 mg by mouth daily.   CHEMOTHERAPY INTENT: palliative  CURRENT # OF CHEMOTHERAPY CYCLES: 40 CURRENT ANTIEMETICS: Compazine when necessary  CURRENT SMOKING STATUS: Never smoker  ORAL CHEMOTHERAPY AND CONSENT: Tarceva and Femara.  CURRENT BISPHOSPHONATES USE: None  PAIN MANAGEMENT: None  NARCOTICS INDUCED CONSTIPATION: None  LIVING WILL AND CODE STATUS: Full code initially but no prolonged resuscitation.   INTERVAL HISTORY: Kristin [name] 73 y.o. female returns to the clinic today for follow up visit accompanied by her daughter [name]. The patient is feeling fine today with no specific complaints except persistent left hip pain. She continues to have skin rash mainly on the legs. She denied having any significant diarrhea. The patient denied having any chest pain, shortness of breath, cough or hemoptysis. She is tolerating her treatment with Tarceva and Femara fairly well except for the skin rash. She denied having any significant nausea or vomiting, no fever or chills. She has no weight loss or night sweats. She had repeat CT scan of the chest, abdomen and pelvis performed recently and she is here for evaluation and discussion of her scan results.  MEDICAL HISTORY: Past Medical History  Diagnosis Date  . Hypertension   . Anemia   . Breast cancer 2002  . Lung cancer dx'd 12/2010  . Stroke     ALLERGIES:  is allergic to sulfa antibiotics.  MEDICATIONS:  Current Outpatient Prescriptions  Medication Sig Dispense Refill   . amLODipine (NORVASC) 5 MG tablet Take 5 mg by mouth daily.        . brimonidine-timolol (COMBIGAN) 0.2-0.5 % ophthalmic solution Place 1 drop into the right eye every 12 (twelve) hours.      . Calcium Carb-Cholecalciferol (RA CALCIUM 600/VITAMIN D-3) 600-400 MG-UNIT TABS Take 2 tablets by mouth daily.        . clindamycin (CLEOCIN T) 1 % lotion Apply topically 2 (two) times daily.  60 mL  2  . clopidogrel (PLAVIX) 75 MG tablet Take 1 tablet (75 mg total) by mouth daily with breakfast.  90 tablet  3  . cycloSPORINE (RESTASIS) 0.05 % ophthalmic emulsion Place 1 drop into both eyes 2 (two) times daily.      Marland Kitchen erlotinib (TARCEVA) 150 MG tablet Take 1 tablet (150 mg total) by mouth daily. Take on an empty stomach 1 hour before meals or 2 hours after.  30 tablet  1  . escitalopram (LEXAPRO) 10 MG tablet Take 15 mg by mouth daily. Takes 20m and 132mtablet daily = 1537maily      . escitalopram (LEXAPRO) 5 mg TABS tablet Take 5 mg by mouth. Takes 5mg34md 10mg70mlet daily = 15mg 75my      . famotidine (PEPCID) 40 MG tablet Take 40 mg by mouth daily.        . FeFuMarland Kitchen-FePoly-FA-B Cmp-C-Biot (FOLIVANE-PLUS) CAPS TAKE 1 CAPSULE BY MOUTH DAILY  90 capsule  1  . letrozole (FEMARA) 2.5 MG tablet TAKE ONE TABLET EACH DAY  30 tablet  5  . lisinopril (PRINIVIL,ZESTRIL)  10 MG tablet 20 mg.       . zolpidem (AMBIEN) 5 MG tablet at bedtime as needed.       . [DISCONTINUED] erlotinib (TARCEVA) 150 MG tablet Take 1 tablet (150 mg total) by mouth daily.  30 tablet  2   No current facility-administered medications for this visit.    SURGICAL HISTORY:  Past Surgical History  Procedure Laterality Date  . Appendectomy    . Mastectomy      bilateral  . Lung surgery    . Cataracts      bilateral  . Colonoscopy    . Tonsillectomy    . Tee without cardioversion N/A 06/04/2013    Procedure: TRANSESOPHAGEAL ECHOCARDIOGRAM (TEE);  Surgeon: Jolaine Artist, MD;  Location: [facility] ENDOSCOPY;  Service: Cardiovascular;   Laterality: N/A;    REVIEW OF SYSTEMS:  Constitutional: negative Eyes: negative Ears, nose, mouth, throat, and face: negative Respiratory: negative Cardiovascular: negative Gastrointestinal: negative Genitourinary:negative Integument/breast: positive for rash Hematologic/lymphatic: negative Musculoskeletal:positive for bone pain Neurological: negative Behavioral/Psych: negative Endocrine: negative Allergic/Immunologic: negative   PHYSICAL EXAMINATION: General appearance: alert, cooperative and no distress Head: Normocephalic, without obvious abnormality, atraumatic Neck: no adenopathy, no JVD and thyroid not enlarged, symmetric, no tenderness/mass/nodules Lymph nodes: Cervical, supraclavicular, and axillary nodes normal. Resp: clear to auscultation bilaterally and normal percussion bilaterally Back: symmetric, no curvature. ROM normal. No CVA tenderness. Cardio: regular rate and rhythm, S1, S2 normal, no murmur, click, rub or gallop GI: soft, non-tender; bowel sounds normal; no masses,  no organomegaly Extremities: extremities normal, atraumatic, no cyanosis or edema and Grade 2 skin rash on the lower extremities Neurologic: Alert and oriented X 3, normal strength and tone. Normal symmetric reflexes. Normal coordination and gait  ECOG PERFORMANCE STATUS: 1 - Symptomatic but completely ambulatory  Blood pressure 138/60, pulse 55, temperature 97.8 F (36.6 C), temperature source Oral, resp. rate 18, height 5' 8"  (1.727 m), weight 145 lb 8 oz (65.998 kg).  LABORATORY DATA: Lab Results  Component Value Date   WBC 5.9 04/01/2014   HGB 10.9* 04/01/2014   HCT 33.9* 04/01/2014   MCV 90.6 04/01/2014   PLT 422* 04/01/2014      Chemistry      Component Value Date/Time   NA 140 04/01/2014 0910   NA 139 04/30/2013 0430   NA 147* 03/26/2012 1022   K 4.5 04/01/2014 0910   K 4.1 04/30/2013 0430   K 4.8* 03/26/2012 1022   CL 105 04/30/2013 0430   CL 107 04/05/2013 0754   CL 102 03/26/2012 1022     CO2 27 04/01/2014 0910   CO2 27 04/30/2013 0430   CO2 31 03/26/2012 1022   BUN 18.7 04/01/2014 0910   BUN 14 04/30/2013 0430   BUN 14 03/26/2012 1022   CREATININE 1.0 04/01/2014 0910   CREATININE 0.81 04/30/2013 0430   CREATININE 0.7 03/26/2012 1022      Component Value Date/Time   CALCIUM 9.7 04/01/2014 0910   CALCIUM 9.3 04/30/2013 0430   CALCIUM 9.1 03/26/2012 1022   ALKPHOS 87 04/01/2014 0910   ALKPHOS 73 04/30/2013 0430   ALKPHOS 84 03/26/2012 1022   AST 19 04/01/2014 0910   AST 23 04/30/2013 0430   AST 26 03/26/2012 1022   ALT 13 04/01/2014 0910   ALT 15 04/30/2013 0430   ALT 20 03/26/2012 1022   BILITOT 0.78 04/01/2014 0910   BILITOT 0.3 04/30/2013 0430   BILITOT 0.70 03/26/2012 1022        RADIOGRAPHIC  STUDIES: Ct Chest W Contrast  04/01/2014   CLINICAL DATA:  Followup lung cancer and breast cancer.  EXAM: CT CHEST, ABDOMEN, AND PELVIS WITH CONTRAST  TECHNIQUE: Multidetector CT imaging of the chest, abdomen and pelvis was performed following the standard protocol during bolus administration of intravenous contrast.  CONTRAST:  164m OMNIPAQUE IOHEXOL 300 MG/ML  SOLN  COMPARISON:  12/29/2013  FINDINGS:   CT CHEST FINDINGS  The heart size appears normal. There is no pericardial effusion identified. There is no enlarged mediastinal or hilar lymph nodes. Mild calcified atherosclerotic disease involves the thoracic aortic arch. Large hiatal hernia is again identified.  There is no pleural effusion identified. Postoperative changes within the left upper lobe identified. No evidence for residual or recurrent tumor within the lungs.  Review of the visualized bony structures is significant for mild multi level spondylosis within the thoracic spine. There are no aggressive lytic or sclerotic bone lesions.    CT ABDOMEN AND PELVIS FINDINGS  2.5 cm cyst within the right hepatic lobe is unchanged. The gallbladder appears normal. No biliary dilatation. Normal appearance of the pancreas. The spleen is unremarkable.   The right adrenal gland is normal. Stable 8 mm left adrenal gland nodule, image 55/series 2. Normal appearance of both kidneys. The urinary bladder appears normal. Calcified fibroid identified within the uterus.  There is calcified atherosclerotic disease involving the abdominal aorta. No aneurysm. No abdominal adenopathy. There is no pelvic or inguinal adenopathy. Normal caliber of the small bowel loops. The colon is normal.  Review of the visualized bony structures is significant for mild lumbar degenerative disc disease.    IMPRESSION: 1. No acute findings identified within the chest abdomen or pelvis. 2. No evidence for residual or recurrence of tumor within the chest abdomen or pelvis.  Review of the   Electronically Signed   By: TKerby MoorsM.D.   On: 04/01/2014 14:27    ASSESSMENT AND PLAN: this is a very pleasant 73years old white female with history of metastatic non-small cell lung cancer, adenocarcinoma currently on treatment with Tarceva for the last 39 months with no evidence for disease progression. Her recent scan showed stable disease with no evidence for disease recurrence or residual tumor. There was no evidence for metastatic bone disease especially in the left hip. I discussed the scan results with the patient and her daughter. I recommended for the patient to continue her current treatment with Tarceva with the same dose.  For the history of the breast cancer, the patient will continue on Femara 2.5 mg by mouth daily. For the left hip pain, I ordered an MRI of the left hip to rule out any metastatic disease to this area. For the skin rash, I will start the patient on Medrol Dosepak. She was unable to tolerate treatment with doxycycline. She would come back for followup visit in one month for reevaluation and repeat blood work The patient voices understanding of current disease status and treatment options and is in agreement with the current care plan.  All questions were  answered. The patient knows to call the clinic with any problems, questions or concerns. We can certainly see the patient much sooner if necessary.  Disclaimer: This note was dictated with voice recognition software. Similar sounding words can inadvertently be transcribed and may not be corrected upon review.

## 2014-04-04 NOTE — Telephone Encounter (Signed)
, °

## 2014-04-13 ENCOUNTER — Ambulatory Visit (HOSPITAL_COMMUNITY)
Admission: RE | Admit: 2014-04-13 | Discharge: 2014-04-13 | Disposition: A | Discharge status: Home / Self Care | Payer: Medicare Other | Source: Ambulatory Visit | Attending: Internal Medicine | Admitting: Internal Medicine

## 2014-04-13 DIAGNOSIS — M25559 Pain in unspecified hip: Secondary | ICD-10-CM

## 2014-04-13 DIAGNOSIS — C3410 Malignant neoplasm of upper lobe, unspecified bronchus or lung: Secondary | ICD-10-CM

## 2014-04-13 MED ORDER — GADOBENATE DIMEGLUMINE 529 MG/ML IV SOLN
20.0000 mL | Freq: Once | INTRAVENOUS | Status: AC | PRN
  Administered 2014-04-13: 14 mL via INTRAVENOUS

## 2014-04-14 ENCOUNTER — Other Ambulatory Visit: Payer: Self-pay | Admitting: Physician Assistant

## 2014-05-03 ENCOUNTER — Encounter: Payer: Self-pay | Admitting: Internal Medicine

## 2014-05-03 ENCOUNTER — Telehealth: Payer: Self-pay | Admitting: Internal Medicine

## 2014-05-03 ENCOUNTER — Ambulatory Visit (HOSPITAL_BASED_OUTPATIENT_CLINIC_OR_DEPARTMENT_OTHER): Payer: Medicare Other | Admitting: Internal Medicine

## 2014-05-03 ENCOUNTER — Other Ambulatory Visit (HOSPITAL_BASED_OUTPATIENT_CLINIC_OR_DEPARTMENT_OTHER): Payer: Medicare Other

## 2014-05-03 VITALS — BP 171/60 | HR 60 | Temp 97.9°F | Resp 19 | Ht 68.0 in | Wt 145.5 lb

## 2014-05-03 DIAGNOSIS — C3412 Malignant neoplasm of upper lobe, left bronchus or lung: Secondary | ICD-10-CM

## 2014-05-03 DIAGNOSIS — M25559 Pain in unspecified hip: Secondary | ICD-10-CM

## 2014-05-03 DIAGNOSIS — C50919 Malignant neoplasm of unspecified site of unspecified female breast: Secondary | ICD-10-CM

## 2014-05-03 DIAGNOSIS — C3410 Malignant neoplasm of upper lobe, unspecified bronchus or lung: Secondary | ICD-10-CM

## 2014-05-03 LAB — CBC WITH DIFFERENTIAL/PLATELET
BASO%: 1.1 % (ref 0.0–2.0)
Basophils Absolute: 0.1 10*3/uL (ref 0.0–0.1)
EOS ABS: 0.3 10*3/uL (ref 0.0–0.5)
EOS%: 5.1 % (ref 0.0–7.0)
HEMATOCRIT: 32.3 % — AB (ref 34.8–46.6)
HGB: 10.2 g/dL — ABNORMAL LOW (ref 11.6–15.9)
LYMPH%: 17.8 % (ref 14.0–49.7)
MCH: 29 pg (ref 25.1–34.0)
MCHC: 31.6 g/dL (ref 31.5–36.0)
MCV: 91.8 fL (ref 79.5–101.0)
MONO#: 0.7 10*3/uL (ref 0.1–0.9)
MONO%: 13.1 % (ref 0.0–14.0)
NEUT%: 62.9 % (ref 38.4–76.8)
NEUTROS ABS: 3.6 10*3/uL (ref 1.5–6.5)
PLATELETS: 411 10*3/uL — AB (ref 145–400)
RBC: 3.52 10*6/uL — AB (ref 3.70–5.45)
RDW: 14.3 % (ref 11.2–14.5)
WBC: 5.7 10*3/uL (ref 3.9–10.3)
lymph#: 1 10*3/uL (ref 0.9–3.3)

## 2014-05-03 LAB — COMPREHENSIVE METABOLIC PANEL (CC13)
ALK PHOS: 71 U/L (ref 40–150)
ALT: 14 U/L (ref 0–55)
ANION GAP: 5 meq/L (ref 3–11)
AST: 18 U/L (ref 5–34)
Albumin: 3.2 g/dL — ABNORMAL LOW (ref 3.5–5.0)
BILIRUBIN TOTAL: 0.62 mg/dL (ref 0.20–1.20)
BUN: 17.7 mg/dL (ref 7.0–26.0)
CO2: 28 meq/L (ref 22–29)
Calcium: 9.5 mg/dL (ref 8.4–10.4)
Chloride: 108 mEq/L (ref 98–109)
Creatinine: 1 mg/dL (ref 0.6–1.1)
GLUCOSE: 89 mg/dL (ref 70–140)
Potassium: 4.9 mEq/L (ref 3.5–5.1)
SODIUM: 140 meq/L (ref 136–145)
TOTAL PROTEIN: 6.1 g/dL — AB (ref 6.4–8.3)

## 2014-05-03 MED ORDER — ERLOTINIB HCL 150 MG PO TABS: 150.0000 mg | ORAL_TABLET | Freq: Every day | ORAL | Status: DC

## 2014-05-03 NOTE — Progress Notes (Signed)
[place] [phone]   [phone]  OFFICE PROGRESS NOTE  Eber Hong, MD [address] [place] [address]  Principle Diagnosis:  1) Metastatic non-small cell lung cancer adenocarcinoma with positive for EGFR mutation at exon 21 diagnosed in March of 2012.  2) history of breast cancer.   Prior Therapy: None.   Current therapy:  1) Tarceva at 150 mg by mouth daily status post 40 months therapy.  2) Femara 2.5 mg by mouth daily.   CHEMOTHERAPY INTENT: palliative  CURRENT # OF CHEMOTHERAPY CYCLES: 41 CURRENT ANTIEMETICS: Compazine when necessary  CURRENT SMOKING STATUS: Never smoker  ORAL CHEMOTHERAPY AND CONSENT: Tarceva and Femara.  CURRENT BISPHOSPHONATES USE: None  PAIN MANAGEMENT: None  NARCOTICS INDUCED CONSTIPATION: None  LIVING WILL AND CODE STATUS: Full code initially but no prolonged resuscitation.   INTERVAL HISTORY: Kristin [name] 73 y.o. female returns to the clinic today for follow up visit accompanied by her daughter Kristin [name]. The patient is feeling fine today with no specific complaints except persistent left hip pain. She had MRI of the pelvis performed recently that showed evidence for osteoarthritis on the hips bilaterally more on the left with synovitis. She continues to have skin rash mainly on the legs. She denied having any significant diarrhea. The patient denied having any chest pain, shortness of breath, cough or hemoptysis. She is tolerating her treatment with Tarceva and Femara fairly well except for the skin rash. She denied having any significant nausea or vomiting, no fever or chills. She has no weight loss or night sweats.   MEDICAL HISTORY: Past Medical History  Diagnosis Date  . Hypertension   . Anemia   . Breast cancer 2002  . Lung cancer dx'd 12/2010  . Stroke     ALLERGIES:  is allergic to sulfa antibiotics.  MEDICATIONS:  Current Outpatient Prescriptions  Medication Sig Dispense  Refill  . amLODipine (NORVASC) 5 MG tablet Take 5 mg by mouth daily.        . brimonidine-timolol (COMBIGAN) 0.2-0.5 % ophthalmic solution Place 1 drop into the right eye every 12 (twelve) hours.      . Calcium Carb-Cholecalciferol (RA CALCIUM 600/VITAMIN D-3) 600-400 MG-UNIT TABS Take 2 tablets by mouth daily.        . clindamycin (CLEOCIN T) 1 % lotion APPLY TWICE DAILY AS DIRECTED  60 mL  1  . clopidogrel (PLAVIX) 75 MG tablet Take 1 tablet (75 mg total) by mouth daily with breakfast.  90 tablet  3  . cycloSPORINE (RESTASIS) 0.05 % ophthalmic emulsion Place 1 drop into both eyes 2 (two) times daily.      Marland Kitchen erlotinib (TARCEVA) 150 MG tablet Take 1 tablet (150 mg total) by mouth daily. Take on an empty stomach 1 hour before meals or 2 hours after.  30 tablet  1  . escitalopram (LEXAPRO) 10 MG tablet Take 15 mg by mouth daily. Takes 49m and 179mtablet daily = 1553maily      . escitalopram (LEXAPRO) 5 mg TABS tablet Take 5 mg by mouth. Takes 5mg46md 10mg84mlet daily = 15mg 74my      . famotidine (PEPCID) 40 MG tablet Take 40 mg by mouth daily.        . FeFuMarland Kitchen-FePoly-FA-B Cmp-C-Biot (FOLIVANE-PLUS) CAPS TAKE 1 CAPSULE BY MOUTH DAILY  90 capsule  1  . letrozole (FEMARA) 2.5 MG tablet TAKE ONE TABLET EACH DAY  30 tablet  5  . lisinopril (PRINIVIL,ZESTRIL) 10 MG  tablet 20 mg.       . methylPREDNIsolone (MEDROL DOSPACK) 4 MG tablet follow package directions  21 tablet  0  . zolpidem (AMBIEN) 5 MG tablet at bedtime as needed.       . [DISCONTINUED] erlotinib (TARCEVA) 150 MG tablet Take 1 tablet (150 mg total) by mouth daily.  30 tablet  2   No current facility-administered medications for this visit.    SURGICAL HISTORY:  Past Surgical History  Procedure Laterality Date  . Appendectomy    . Mastectomy      bilateral  . Lung surgery    . Cataracts      bilateral  . Colonoscopy    . Tonsillectomy    . Tee without cardioversion N/A 06/04/2013    Procedure: TRANSESOPHAGEAL ECHOCARDIOGRAM  (TEE);  Surgeon: Jolaine Artist, MD;  Location: [facility] ENDOSCOPY;  Service: Cardiovascular;  Laterality: N/A;    REVIEW OF SYSTEMS:  Constitutional: negative Eyes: negative Ears, nose, mouth, throat, and face: negative Respiratory: negative Cardiovascular: negative Gastrointestinal: negative Genitourinary:negative Integument/breast: positive for rash Hematologic/lymphatic: negative Musculoskeletal:positive for bone pain Neurological: negative Behavioral/Psych: negative Endocrine: negative Allergic/Immunologic: negative   PHYSICAL EXAMINATION: General appearance: alert, cooperative and no distress Head: Normocephalic, without obvious abnormality, atraumatic Neck: no adenopathy, no JVD and thyroid not enlarged, symmetric, no tenderness/mass/nodules Lymph nodes: Cervical, supraclavicular, and axillary nodes normal. Resp: clear to auscultation bilaterally and normal percussion bilaterally Back: symmetric, no curvature. ROM normal. No CVA tenderness. Cardio: regular rate and rhythm, S1, S2 normal, no murmur, click, rub or gallop GI: soft, non-tender; bowel sounds normal; no masses,  no organomegaly Extremities: extremities normal, atraumatic, no cyanosis or edema and Grade 2 skin rash on the lower extremities Neurologic: Alert and oriented X 3, normal strength and tone. Normal symmetric reflexes. Normal coordination and gait  ECOG PERFORMANCE STATUS: 1 - Symptomatic but completely ambulatory  Blood pressure 171/60, pulse 60, temperature 97.9 F (36.6 C), temperature source Oral, resp. rate 19, height 5' 8"  (1.727 m), weight 145 lb 8 oz (65.998 kg).  LABORATORY DATA: Lab Results  Component Value Date   WBC 5.7 05/03/2014   HGB 10.2* 05/03/2014   HCT 32.3* 05/03/2014   MCV 91.8 05/03/2014   PLT 411* 05/03/2014      Chemistry      Component Value Date/Time   NA 140 05/03/2014 1024   NA 139 04/30/2013 0430   NA 147* 03/26/2012 1022   K 4.9 05/03/2014 1024   K 4.1 04/30/2013 0430   K  4.8* 03/26/2012 1022   CL 105 04/30/2013 0430   CL 107 04/05/2013 0754   CL 102 03/26/2012 1022   CO2 28 05/03/2014 1024   CO2 27 04/30/2013 0430   CO2 31 03/26/2012 1022   BUN 17.7 05/03/2014 1024   BUN 14 04/30/2013 0430   BUN 14 03/26/2012 1022   CREATININE 1.0 05/03/2014 1024   CREATININE 0.81 04/30/2013 0430   CREATININE 0.7 03/26/2012 1022      Component Value Date/Time   CALCIUM 9.5 05/03/2014 1024   CALCIUM 9.3 04/30/2013 0430   CALCIUM 9.1 03/26/2012 1022   ALKPHOS 71 05/03/2014 1024   ALKPHOS 73 04/30/2013 0430   ALKPHOS 84 03/26/2012 1022   AST 18 05/03/2014 1024   AST 23 04/30/2013 0430   AST 26 03/26/2012 1022   ALT 14 05/03/2014 1024   ALT 15 04/30/2013 0430   ALT 20 03/26/2012 1022   BILITOT 0.62 05/03/2014 1024   BILITOT 0.3 04/30/2013 0430  BILITOT 0.70 03/26/2012 1022        RADIOGRAPHIC STUDIES: Mr Hip Left W Wo Contrast  04/13/2014   CLINICAL DATA:  Left hip pain  EXAM: MRI OF THE LEFT HIP WITHOUT AND WITH CONTRAST  TECHNIQUE: Multiplanar, multisequence MR imaging was performed both before and after administration of intravenous contrast.  CONTRAST:  61m MULTIHANCE GADOBENATE DIMEGLUMINE 529 MG/ML IV SOLN  COMPARISON:  None.  FINDINGS: HIP JOINTS  Both femoral heads appear normal without evidence of acute fracture, dislocation or avascular necrosis. There is no significant hip joint effusion. There is no gross labral or paralabral abnormality.  There is mild left hip cartilage loss with subchondral reactive marrow changes primarily within the acetabulum. Mild cartilage irregularity of the right hip with partial thickness cartilage loss.  BONY PELVIS  The remainder of the visualized bony pelvis appears normal. The sacroiliac joints appear normal.  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  MUSCLES AND TENDONS  The gluteus, hamstring and iliopsoas tendons appear normal. The piriformis muscles appear symmetric.  INTERNAL PELVIC FINDINGS  The visualized internal  pelvic contents are unremarkable.  IMPRESSION: 1. Mild osteoarthritis of bilateral hips, left greater than right. Mild left hip synovitis likely inflammatory secondary to osteoarthritis. 2. No osseous lesions within the pelvis to suggest metastatic disease. 3. Lower lumbar spine spondylosis.   Electronically Signed   By: HKathreen Devoid  On: 04/13/2014 13:23   ASSESSMENT AND PLAN: this is a very pleasant 73years old white female with history of metastatic non-small cell lung cancer, adenocarcinoma currently on treatment with Tarceva for the last 39 months with no evidence for disease progression. Her recent MRI of the left hip showed no evidence for metastatic disease but only osteoarthritis. I gave her a copy of the MRI report to share with her primary care physician for evaluation and treatment. I recommended for the patient to continue her current treatment with Tarceva with the same dose.  For the history of the breast cancer, the patient will continue on Femara 2.5 mg by mouth daily. She would come back for followup visit in one month for reevaluation and repeat blood work The patient voices understanding of current disease status and treatment options and is in agreement with the current care plan.  All questions were answered. The patient knows to call the clinic with any problems, questions or concerns. We can certainly see the patient much sooner if necessary.  Disclaimer: This note was dictated with voice recognition software. Similar sounding words can inadvertently be transcribed and may not be corrected upon review.

## 2014-05-03 NOTE — Telephone Encounter (Signed)
gv and printed appt sched adn avs for pt for Aug

## 2014-05-03 NOTE — Addendum Note (Signed)
Addended by: Curt Bears on: 05/03/2014 05:13 PM   Modules accepted: Orders

## 2014-05-30 DIAGNOSIS — M1612 Unilateral primary osteoarthritis, left hip: Secondary | ICD-10-CM | POA: Insufficient documentation

## 2014-05-30 DIAGNOSIS — M519 Unspecified thoracic, thoracolumbar and lumbosacral intervertebral disc disorder: Secondary | ICD-10-CM | POA: Insufficient documentation

## 2014-05-31 ENCOUNTER — Telehealth: Payer: Self-pay | Admitting: Internal Medicine

## 2014-05-31 ENCOUNTER — Ambulatory Visit (HOSPITAL_BASED_OUTPATIENT_CLINIC_OR_DEPARTMENT_OTHER): Payer: Medicare Other | Admitting: Physician Assistant

## 2014-05-31 ENCOUNTER — Other Ambulatory Visit (HOSPITAL_BASED_OUTPATIENT_CLINIC_OR_DEPARTMENT_OTHER): Payer: Medicare Other

## 2014-05-31 VITALS — BP 167/51 | HR 62 | Temp 97.9°F | Resp 18 | Ht 68.0 in | Wt 146.4 lb

## 2014-05-31 DIAGNOSIS — M25559 Pain in unspecified hip: Secondary | ICD-10-CM

## 2014-05-31 DIAGNOSIS — C3410 Malignant neoplasm of upper lobe, unspecified bronchus or lung: Secondary | ICD-10-CM

## 2014-05-31 DIAGNOSIS — M949 Disorder of cartilage, unspecified: Secondary | ICD-10-CM

## 2014-05-31 DIAGNOSIS — C3412 Malignant neoplasm of upper lobe, left bronchus or lung: Secondary | ICD-10-CM

## 2014-05-31 DIAGNOSIS — C50919 Malignant neoplasm of unspecified site of unspecified female breast: Secondary | ICD-10-CM

## 2014-05-31 DIAGNOSIS — M899 Disorder of bone, unspecified: Secondary | ICD-10-CM

## 2014-05-31 DIAGNOSIS — R21 Rash and other nonspecific skin eruption: Secondary | ICD-10-CM

## 2014-05-31 LAB — CBC WITH DIFFERENTIAL/PLATELET
BASO%: 0.9 % (ref 0.0–2.0)
Basophils Absolute: 0.1 10*3/uL (ref 0.0–0.1)
EOS ABS: 0.4 10*3/uL (ref 0.0–0.5)
EOS%: 5 % (ref 0.0–7.0)
HEMATOCRIT: 32.5 % — AB (ref 34.8–46.6)
HGB: 10.5 g/dL — ABNORMAL LOW (ref 11.6–15.9)
LYMPH#: 1 10*3/uL (ref 0.9–3.3)
LYMPH%: 13.8 % — ABNORMAL LOW (ref 14.0–49.7)
MCH: 29.5 pg (ref 25.1–34.0)
MCHC: 32.4 g/dL (ref 31.5–36.0)
MCV: 91.2 fL (ref 79.5–101.0)
MONO#: 0.7 10*3/uL (ref 0.1–0.9)
MONO%: 8.8 % (ref 0.0–14.0)
NEUT%: 71.5 % (ref 38.4–76.8)
NEUTROS ABS: 5.3 10*3/uL (ref 1.5–6.5)
PLATELETS: 488 10*3/uL — AB (ref 145–400)
RBC: 3.57 10*6/uL — ABNORMAL LOW (ref 3.70–5.45)
RDW: 13.8 % (ref 11.2–14.5)
WBC: 7.5 10*3/uL (ref 3.9–10.3)

## 2014-05-31 LAB — COMPREHENSIVE METABOLIC PANEL
ALT: 13 U/L (ref 0–35)
AST: 18 U/L (ref 0–37)
Albumin: 3.6 g/dL (ref 3.5–5.2)
Alkaline Phosphatase: 80 U/L (ref 39–117)
BUN: 17 mg/dL (ref 6–23)
CHLORIDE: 104 meq/L (ref 96–112)
CO2: 26 meq/L (ref 19–32)
Calcium: 9.2 mg/dL (ref 8.4–10.5)
Creatinine, Ser: 0.9 mg/dL (ref 0.50–1.10)
GLUCOSE: 121 mg/dL — AB (ref 70–99)
Potassium: 4.1 mEq/L (ref 3.5–5.3)
Sodium: 136 mEq/L (ref 135–145)
Total Bilirubin: 0.4 mg/dL (ref 0.2–1.2)
Total Protein: 5.9 g/dL — ABNORMAL LOW (ref 6.0–8.3)

## 2014-05-31 NOTE — Telephone Encounter (Signed)
, °

## 2014-06-07 NOTE — Progress Notes (Signed)
[place] [phone]   Fax:(336) [phone]  OFFICE PROGRESS NOTE  Eber Hong, MD [address] [place] [address]  Principle Diagnosis:  1) Metastatic non-small cell lung cancer adenocarcinoma with positive for EGFR mutation at exon 21 diagnosed in March of 2012.  2) history of breast cancer.   Prior Therapy: None.   Current therapy:  1) Tarceva at 150 mg by mouth daily status post 41 months therapy.  2) Femara 2.5 mg by mouth daily.   CHEMOTHERAPY INTENT: palliative  CURRENT # OF CHEMOTHERAPY CYCLES: 42 CURRENT ANTIEMETICS: Compazine when necessary  CURRENT SMOKING STATUS: Never smoker  ORAL CHEMOTHERAPY AND CONSENT: Tarceva and Femara.  CURRENT BISPHOSPHONATES USE: None  PAIN MANAGEMENT: None  NARCOTICS INDUCED CONSTIPATION: None  LIVING WILL AND CODE STATUS: Full code initially but no prolonged resuscitation.   INTERVAL HISTORY: Kristin [name] 73 y.o. female returns to the clinic today for follow up visit.  The patient is feeling fine today with no specific complaints except persistent left hip pain. She continues to have skin rash mainly on the legs. She denied having any significant diarrhea. The patient denied having any chest pain, shortness of breath, cough or hemoptysis. She is tolerating her treatment with Tarceva and Femara fairly well except for the skin rash. She denied having any significant nausea or vomiting, no fever or chills. She has no weight loss or night sweats.   MEDICAL HISTORY: Past Medical History  Diagnosis Date  . Hypertension   . Anemia   . Breast cancer 2002  . Lung cancer dx'd 12/2010  . Stroke     ALLERGIES:  is allergic to sulfa antibiotics.  MEDICATIONS:  Current Outpatient Prescriptions  Medication Sig Dispense Refill  . amLODipine (NORVASC) 5 MG tablet Take 5 mg by mouth daily.        . brimonidine-timolol (COMBIGAN) 0.2-0.5 % ophthalmic solution Place 1 drop into the right eye every 12  (twelve) hours.      . Calcium Carb-Cholecalciferol (RA CALCIUM 600/VITAMIN D-3) 600-400 MG-UNIT TABS Take 2 tablets by mouth daily.        . clindamycin (CLEOCIN T) 1 % lotion APPLY TWICE DAILY AS DIRECTED  60 mL  1  . clopidogrel (PLAVIX) 75 MG tablet Take 1 tablet (75 mg total) by mouth daily with breakfast.  90 tablet  3  . cycloSPORINE (RESTASIS) 0.05 % ophthalmic emulsion Place 1 drop into both eyes 2 (two) times daily.      Marland Kitchen erlotinib (TARCEVA) 150 MG tablet Take 1 tablet (150 mg total) by mouth daily. Take on an empty stomach 1 hour before meals or 2 hours after.  30 tablet  1  . escitalopram (LEXAPRO) 10 MG tablet Take 15 mg by mouth daily. Takes 68m and 149mtablet daily = 1537maily      . escitalopram (LEXAPRO) 5 mg TABS tablet Take 5 mg by mouth. Takes 5mg32md 10mg50mlet daily = 15mg 110my      . famotidine (PEPCID) 40 MG tablet Take 40 mg by mouth daily.        . FeFuMarland Kitchen-FePoly-FA-B Cmp-C-Biot (FOLIVANE-PLUS) CAPS TAKE 1 CAPSULE BY MOUTH DAILY  90 capsule  1  . letrozole (FEMARA) 2.5 MG tablet TAKE ONE TABLET EACH DAY  30 tablet  5  . lisinopril (PRINIVIL,ZESTRIL) 10 MG tablet 20 mg.       . methylPREDNIsolone (MEDROL DOSPACK) 4 MG tablet follow package directions  21 tablet  0  . zolpidem (  AMBIEN) 5 MG tablet at bedtime as needed.       . [DISCONTINUED] erlotinib (TARCEVA) 150 MG tablet Take 1 tablet (150 mg total) by mouth daily.  30 tablet  2   No current facility-administered medications for this visit.    SURGICAL HISTORY:  Past Surgical History  Procedure Laterality Date  . Appendectomy    . Mastectomy      bilateral  . Lung surgery    . Cataracts      bilateral  . Colonoscopy    . Tonsillectomy    . Tee without cardioversion N/A 06/04/2013    Procedure: TRANSESOPHAGEAL ECHOCARDIOGRAM (TEE);  Surgeon: Jolaine Artist, MD;  Location: [facility] ENDOSCOPY;  Service: Cardiovascular;  Laterality: N/A;    REVIEW OF SYSTEMS:  Constitutional: negative Eyes: negative Ears,  nose, mouth, throat, and face: negative Respiratory: negative Cardiovascular: negative Gastrointestinal: positive for diarrhea and Occasional episodes Genitourinary:negative Integument/breast: positive for rash Hematologic/lymphatic: negative Musculoskeletal:positive for bone pain Neurological: negative Behavioral/Psych: negative Endocrine: negative Allergic/Immunologic: negative   PHYSICAL EXAMINATION: General appearance: alert, cooperative and no distress Head: Normocephalic, without obvious abnormality, atraumatic Neck: no adenopathy, no JVD and thyroid not enlarged, symmetric, no tenderness/mass/nodules Lymph nodes: Cervical, supraclavicular, and axillary nodes normal. Resp: clear to auscultation bilaterally and normal percussion bilaterally Back: symmetric, no curvature. ROM normal. No CVA tenderness. Cardio: regular rate and rhythm, S1, S2 normal, no murmur, click, rub or gallop GI: soft, non-tender; bowel sounds normal; no masses,  no organomegaly Extremities: extremities normal, atraumatic, no cyanosis or edema and Grade 2 skin rash on the lower extremities Neurologic: Alert and oriented X 3, normal strength and tone. Normal symmetric reflexes. Normal coordination and gait Skin: No current significant rash related to the Tarceva  ECOG PERFORMANCE STATUS: 1 - Symptomatic but completely ambulatory  Blood pressure 167/51, pulse 62, temperature 97.9 F (36.6 C), temperature source Oral, resp. rate 18, height 5' 8"  (1.727 m), weight 146 lb 6.4 oz (66.407 kg).  LABORATORY DATA: Lab Results  Component Value Date   WBC 7.5 05/31/2014   HGB 10.5* 05/31/2014   HCT 32.5* 05/31/2014   MCV 91.2 05/31/2014   PLT 488* 05/31/2014      Chemistry      Component Value Date/Time   NA 136 05/31/2014 1058   NA 140 05/03/2014 1024   NA 147* 03/26/2012 1022   K 4.1 05/31/2014 1058   K 4.9 05/03/2014 1024   K 4.8* 03/26/2012 1022   CL 104 05/31/2014 1058   CL 107 04/05/2013 0754   CL 102 03/26/2012  1022   CO2 26 05/31/2014 1058   CO2 28 05/03/2014 1024   CO2 31 03/26/2012 1022   BUN 17 05/31/2014 1058   BUN 17.7 05/03/2014 1024   BUN 14 03/26/2012 1022   CREATININE 0.90 05/31/2014 1058   CREATININE 1.0 05/03/2014 1024   CREATININE 0.7 03/26/2012 1022      Component Value Date/Time   CALCIUM 9.2 05/31/2014 1058   CALCIUM 9.5 05/03/2014 1024   CALCIUM 9.1 03/26/2012 1022   ALKPHOS 80 05/31/2014 1058   ALKPHOS 71 05/03/2014 1024   ALKPHOS 84 03/26/2012 1022   AST 18 05/31/2014 1058   AST 18 05/03/2014 1024   AST 26 03/26/2012 1022   ALT 13 05/31/2014 1058   ALT 14 05/03/2014 1024   ALT 20 03/26/2012 1022   BILITOT 0.4 05/31/2014 1058   BILITOT 0.62 05/03/2014 1024   BILITOT 0.70 03/26/2012 1022        RADIOGRAPHIC  STUDIES: Mr Hip Left W Wo Contrast  04/13/2014   CLINICAL DATA:  Left hip pain  EXAM: MRI OF THE LEFT HIP WITHOUT AND WITH CONTRAST  TECHNIQUE: Multiplanar, multisequence MR imaging was performed both before and after administration of intravenous contrast.  CONTRAST:  58m MULTIHANCE GADOBENATE DIMEGLUMINE 529 MG/ML IV SOLN  COMPARISON:  None.  FINDINGS: HIP JOINTS  Both femoral heads appear normal without evidence of acute fracture, dislocation or avascular necrosis. There is no significant hip joint effusion. There is no gross labral or paralabral abnormality.  There is mild left hip cartilage loss with subchondral reactive marrow changes primarily within the acetabulum. Mild cartilage irregularity of the right hip with partial thickness cartilage loss.  BONY PELVIS  The remainder of the visualized bony pelvis appears normal. The sacroiliac joints appear normal.  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  MUSCLES AND TENDONS  The gluteus, hamstring and iliopsoas tendons appear normal. The piriformis muscles appear symmetric.  INTERNAL PELVIC FINDINGS  The visualized internal pelvic contents are unremarkable.  IMPRESSION: 1. Mild osteoarthritis of bilateral hips,  left greater than right. Mild left hip synovitis likely inflammatory secondary to osteoarthritis. 2. No osseous lesions within the pelvis to suggest metastatic disease. 3. Lower lumbar spine spondylosis.   Electronically Signed   By: HKathreen Devoid  On: 04/13/2014 13:23   ASSESSMENT AND PLAN: this is a very pleasant 73years old white female with history of metastatic non-small cell lung cancer, adenocarcinoma currently on treatment with Tarceva for the last 41 months with no evidence for disease progression. Her MRI of the left hip showed no evidence for metastatic disease but only osteoarthritis. She will continue on Tarceva at the current dose. For the history of the breast cancer, the patient will continue on Femara 2.5 mg by mouth daily. She'll followup in one month for another symptom management visit with a restaging CT scan of her chest, abdomen and pelvis with contrast to reevaluate her disease. She would come back for followup visit in one month for reevaluation and repeat blood work The patient voices understanding of current disease status and treatment options and is in agreement with the current care plan.  All questions were answered. The patient knows to call the clinic with any problems, questions or concerns. We can certainly see the patient much sooner if necessary.  JCarlton Adam PA-C   Disclaimer: This note was dictated with voice recognition software. Similar sounding words can inadvertently be transcribed and may not be corrected upon review.

## 2014-06-07 NOTE — Patient Instructions (Signed)
Continue taking your Tarceva and your Femara at the current doses Followup in one month with a restaging CT scan of the chest, abdomen and pelvis to reevaluate your disease

## 2014-06-08 ENCOUNTER — Other Ambulatory Visit: Payer: Self-pay | Admitting: *Deleted

## 2014-06-08 DIAGNOSIS — Z853 Personal history of malignant neoplasm of breast: Secondary | ICD-10-CM

## 2014-06-08 MED ORDER — LETROZOLE 2.5 MG PO TABS: 2.5000 mg | ORAL_TABLET | Freq: Every day | ORAL | Status: DC

## 2014-06-20 ENCOUNTER — Other Ambulatory Visit: Payer: Self-pay | Admitting: *Deleted

## 2014-06-20 DIAGNOSIS — C3412 Malignant neoplasm of upper lobe, left bronchus or lung: Secondary | ICD-10-CM

## 2014-06-20 MED ORDER — ERLOTINIB HCL 150 MG PO TABS: 150.0000 mg | ORAL_TABLET | Freq: Every day | ORAL | Status: DC

## 2014-06-20 NOTE — Telephone Encounter (Signed)
RECEIVED A FAX FROM [facility] CONCERNING A CONFIRMATION OF PRESCRIPTION SHIPMENT FOR [name] ON 06/22/14.

## 2014-06-30 ENCOUNTER — Telehealth: Payer: Self-pay | Admitting: *Deleted

## 2014-06-30 ENCOUNTER — Other Ambulatory Visit: Payer: Self-pay | Admitting: *Deleted

## 2014-06-30 NOTE — Telephone Encounter (Signed)
Pt called wanting to know if she had to drink the contrast for the CT of the abdomen tomorrow.  It gives her really bad gas.  Per Dr Vista Mink, okay to just do the CT of the chest with contrast, and we can hold the CT of the abdomen at this time.  Called and informed radiology dept.  SLJ

## 2014-07-01 ENCOUNTER — Encounter (HOSPITAL_COMMUNITY): Payer: Self-pay

## 2014-07-01 ENCOUNTER — Ambulatory Visit (HOSPITAL_COMMUNITY)
Admission: RE | Admit: 2014-07-01 | Discharge: 2014-07-01 | Disposition: A | Discharge status: Home / Self Care | Payer: Medicare Other | Source: Ambulatory Visit | Attending: Physician Assistant | Admitting: Physician Assistant

## 2014-07-01 DIAGNOSIS — C3410 Malignant neoplasm of upper lobe, unspecified bronchus or lung: Secondary | ICD-10-CM | POA: Insufficient documentation

## 2014-07-01 MED ORDER — IOHEXOL 300 MG/ML  SOLN
80.0000 mL | Freq: Once | INTRAMUSCULAR | Status: AC | PRN
  Administered 2014-07-01: 80 mL via INTRAVENOUS

## 2014-07-04 ENCOUNTER — Ambulatory Visit (HOSPITAL_BASED_OUTPATIENT_CLINIC_OR_DEPARTMENT_OTHER): Payer: Medicare Other | Admitting: Internal Medicine

## 2014-07-04 ENCOUNTER — Telehealth: Payer: Self-pay | Admitting: Internal Medicine

## 2014-07-04 ENCOUNTER — Encounter: Payer: Self-pay | Admitting: Internal Medicine

## 2014-07-04 ENCOUNTER — Other Ambulatory Visit (HOSPITAL_BASED_OUTPATIENT_CLINIC_OR_DEPARTMENT_OTHER): Payer: Medicare Other

## 2014-07-04 VITALS — BP 160/55 | HR 63 | Temp 98.0°F | Resp 18 | Ht 68.0 in | Wt 145.0 lb

## 2014-07-04 DIAGNOSIS — C3412 Malignant neoplasm of upper lobe, left bronchus or lung: Secondary | ICD-10-CM

## 2014-07-04 DIAGNOSIS — C3410 Malignant neoplasm of upper lobe, unspecified bronchus or lung: Secondary | ICD-10-CM

## 2014-07-04 DIAGNOSIS — Z853 Personal history of malignant neoplasm of breast: Secondary | ICD-10-CM

## 2014-07-04 LAB — COMPREHENSIVE METABOLIC PANEL (CC13)
ALBUMIN: 3.1 g/dL — AB (ref 3.5–5.0)
ALT: 10 U/L (ref 0–55)
ANION GAP: 8 meq/L (ref 3–11)
AST: 18 U/L (ref 5–34)
Alkaline Phosphatase: 72 U/L (ref 40–150)
BILIRUBIN TOTAL: 0.64 mg/dL (ref 0.20–1.20)
BUN: 19.8 mg/dL (ref 7.0–26.0)
CALCIUM: 9.3 mg/dL (ref 8.4–10.4)
CHLORIDE: 107 meq/L (ref 98–109)
CO2: 26 meq/L (ref 22–29)
Creatinine: 0.9 mg/dL (ref 0.6–1.1)
Glucose: 101 mg/dl (ref 70–140)
Potassium: 4.2 mEq/L (ref 3.5–5.1)
SODIUM: 140 meq/L (ref 136–145)
TOTAL PROTEIN: 6.3 g/dL — AB (ref 6.4–8.3)

## 2014-07-04 LAB — CBC WITH DIFFERENTIAL/PLATELET
BASO%: 1.2 % (ref 0.0–2.0)
Basophils Absolute: 0.1 10*3/uL (ref 0.0–0.1)
EOS ABS: 0.2 10*3/uL (ref 0.0–0.5)
EOS%: 3.3 % (ref 0.0–7.0)
HCT: 33.1 % — ABNORMAL LOW (ref 34.8–46.6)
HGB: 10.6 g/dL — ABNORMAL LOW (ref 11.6–15.9)
LYMPH#: 0.9 10*3/uL (ref 0.9–3.3)
LYMPH%: 16 % (ref 14.0–49.7)
MCH: 28.7 pg (ref 25.1–34.0)
MCHC: 32.2 g/dL (ref 31.5–36.0)
MCV: 89.1 fL (ref 79.5–101.0)
MONO#: 0.7 10*3/uL (ref 0.1–0.9)
MONO%: 12.3 % (ref 0.0–14.0)
NEUT%: 67.2 % (ref 38.4–76.8)
NEUTROS ABS: 4 10*3/uL (ref 1.5–6.5)
Platelets: 452 10*3/uL — ABNORMAL HIGH (ref 145–400)
RBC: 3.72 10*6/uL (ref 3.70–5.45)
RDW: 13.7 % (ref 11.2–14.5)
WBC: 5.9 10*3/uL (ref 3.9–10.3)

## 2014-07-04 NOTE — Telephone Encounter (Signed)
Pt confirmed labs/ov per 09/14 POF, gave pt AVS...Marland KitchenMarland KitchenKJ

## 2014-07-04 NOTE — Progress Notes (Signed)
[place] [phone]   Fax:([phone]  OFFICE PROGRESS NOTE  Eber Hong, MD [address] [place] [address]  Principle Diagnosis:  1) Metastatic non-small cell lung cancer adenocarcinoma with positive for EGFR mutation at exon 21 diagnosed in March of 2012.  2) history of breast cancer.   Prior Therapy: None.   Current therapy:  1) Tarceva at 150 mg by mouth daily status post 42 months therapy.  2) Femara 2.5 mg by mouth daily.   CHEMOTHERAPY INTENT: palliative  CURRENT # OF CHEMOTHERAPY CYCLES: 43 CURRENT ANTIEMETICS: Compazine when necessary  CURRENT SMOKING STATUS: Never smoker  ORAL CHEMOTHERAPY AND CONSENT: Tarceva and Femara.  CURRENT BISPHOSPHONATES USE: None  PAIN MANAGEMENT: None  NARCOTICS INDUCED CONSTIPATION: None  LIVING WILL AND CODE STATUS: Full code initially but no prolonged resuscitation.   INTERVAL HISTORY: Kristin [name] 73 y.o. female returns to the clinic today for follow up visit accompanied by her sister. The patient is feeling fine today with no specific complaints except mild fatigue and skin rash mainly on the legs. She denied having any significant diarrhea. The patient denied having any chest pain, shortness of breath, cough or hemoptysis. She is tolerating her treatment with Tarceva and Femara fairly well except for the skin rash. She denied having any significant nausea or vomiting, no fever or chills. She has no weight loss or night sweats. She had repeat CT scan of the chest performed recently and she is here for evaluation and discussion of her scan results.  MEDICAL HISTORY: Past Medical History  Diagnosis Date  . Hypertension   . Anemia   . Breast cancer 2002  . Lung cancer dx'd 12/2010  . Stroke     ALLERGIES:  is allergic to sulfa antibiotics.  MEDICATIONS:  Current Outpatient Prescriptions  Medication Sig Dispense Refill  . amLODipine (NORVASC) 5 MG tablet Take 5 mg by mouth  daily.        . brimonidine-timolol (COMBIGAN) 0.2-0.5 % ophthalmic solution Place 1 drop into the right eye every 12 (twelve) hours.      . Calcium Carb-Cholecalciferol (RA CALCIUM 600/VITAMIN D-3) 600-400 MG-UNIT TABS Take 2 tablets by mouth daily.        . clindamycin (CLEOCIN T) 1 % lotion APPLY TWICE DAILY AS DIRECTED  60 mL  1  . clopidogrel (PLAVIX) 75 MG tablet Take 1 tablet (75 mg total) by mouth daily with breakfast.  90 tablet  3  . cycloSPORINE (RESTASIS) 0.05 % ophthalmic emulsion Place 1 drop into both eyes 2 (two) times daily.      Marland Kitchen erlotinib (TARCEVA) 150 MG tablet Take 1 tablet (150 mg total) by mouth daily. Take on an empty stomach 1 hour before meals or 2 hours after.  30 tablet  1  . escitalopram (LEXAPRO) 10 MG tablet Take 15 mg by mouth daily. Takes 36m and 147mtablet daily = 1546maily      . escitalopram (LEXAPRO) 5 mg TABS tablet Take 5 mg by mouth. Takes 5mg39md 10mg48mlet daily = 15mg 53my      . famotidine (PEPCID) 40 MG tablet Take 40 mg by mouth daily.        . FeFuMarland Kitchen-FePoly-FA-B Cmp-C-Biot (FOLIVANE-PLUS) CAPS TAKE 1 CAPSULE BY MOUTH DAILY  90 capsule  1  . letrozole (FEMARA) 2.5 MG tablet Take 1 tablet (2.5 mg total) by mouth daily.  30 tablet  2  . lisinopril (PRINIVIL,ZESTRIL) 10 MG tablet 20 mg.       .Marland Kitchen  zolpidem (AMBIEN) 5 MG tablet at bedtime as needed.       . [DISCONTINUED] erlotinib (TARCEVA) 150 MG tablet Take 1 tablet (150 mg total) by mouth daily.  30 tablet  2   No current facility-administered medications for this visit.    SURGICAL HISTORY:  Past Surgical History  Procedure Laterality Date  . Appendectomy    . Mastectomy      bilateral  . Lung surgery    . Cataracts      bilateral  . Colonoscopy    . Tonsillectomy    . Tee without cardioversion N/A 06/04/2013    Procedure: TRANSESOPHAGEAL ECHOCARDIOGRAM (TEE);  Surgeon: Jolaine Artist, MD;  Location: [facility] ENDOSCOPY;  Service: Cardiovascular;  Laterality: N/A;    REVIEW OF SYSTEMS:   Constitutional: negative Eyes: negative Ears, nose, mouth, throat, and face: negative Respiratory: negative Cardiovascular: negative Gastrointestinal: negative Genitourinary:negative Integument/breast: positive for rash Hematologic/lymphatic: negative Musculoskeletal:positive for bone pain Neurological: negative Behavioral/Psych: negative Endocrine: negative Allergic/Immunologic: negative   PHYSICAL EXAMINATION: General appearance: alert, cooperative and no distress Head: Normocephalic, without obvious abnormality, atraumatic Neck: no adenopathy, no JVD and thyroid not enlarged, symmetric, no tenderness/mass/nodules Lymph nodes: Cervical, supraclavicular, and axillary nodes normal. Resp: clear to auscultation bilaterally and normal percussion bilaterally Back: symmetric, no curvature. ROM normal. No CVA tenderness. Cardio: regular rate and rhythm, S1, S2 normal, no murmur, click, rub or gallop GI: soft, non-tender; bowel sounds normal; no masses,  no organomegaly Extremities: extremities normal, atraumatic, no cyanosis or edema and Grade 2 skin rash on the lower extremities Neurologic: Alert and oriented X 3, normal strength and tone. Normal symmetric reflexes. Normal coordination and gait  ECOG PERFORMANCE STATUS: 1 - Symptomatic but completely ambulatory  Blood pressure 160/55, pulse 63, temperature 98 F (36.7 C), temperature source Oral, resp. rate 18, height _0  (1.727 m), weight 145 lb (65.772 kg), SpO2 100.00%.  LABORATORY DATA: Lab Results  Component Value Date   WBC 5.9 07/04/2014   HGB 10.6* 07/04/2014   HCT 33.1* 07/04/2014   MCV 89.1 07/04/2014   PLT 452* 07/04/2014      Chemistry      Component Value Date/Time   NA 136 05/31/2014 1058   NA 140 05/03/2014 1024   NA 147* 03/26/2012 1022   K 4.1 05/31/2014 1058   K 4.9 05/03/2014 1024   K 4.8* 03/26/2012 1022   CL 104 05/31/2014 1058   CL 107 04/05/2013 0754   CL 102 03/26/2012 1022   CO2 26 05/31/2014 1058   CO2 28  05/03/2014 1024   CO2 31 03/26/2012 1022   BUN 17 05/31/2014 1058   BUN 17.7 05/03/2014 1024   BUN 14 03/26/2012 1022   CREATININE 0.90 05/31/2014 1058   CREATININE 1.0 05/03/2014 1024   CREATININE 0.7 03/26/2012 1022      Component Value Date/Time   CALCIUM 9.2 05/31/2014 1058   CALCIUM 9.5 05/03/2014 1024   CALCIUM 9.1 03/26/2012 1022   ALKPHOS 80 05/31/2014 1058   ALKPHOS 71 05/03/2014 1024   ALKPHOS 84 03/26/2012 1022   AST 18 05/31/2014 1058   AST 18 05/03/2014 1024   AST 26 03/26/2012 1022   ALT 13 05/31/2014 1058   ALT 14 05/03/2014 1024   ALT 20 03/26/2012 1022   BILITOT 0.4 05/31/2014 1058   BILITOT 0.62 05/03/2014 1024   BILITOT 0.70 03/26/2012 1022        RADIOGRAPHIC STUDIES: Ct Chest W Contrast  07/01/2014   CLINICAL DATA:  Lung  cancer diagnosed in 2012. Breast cancer 13 years ago. Oral chemotherapy including earlier this year. Left lung resection. Mastectomy. Appendectomy.  EXAM: CT CHEST WITH CONTRAST  TECHNIQUE: Multidetector CT imaging of the chest was performed during intravenous contrast administration.  CONTRAST:  65m OMNIPAQUE IOHEXOL 300 MG/ML  SOLN  COMPARISON:  04/01/2014  FINDINGS: Lungs/Pleura: Left upper lobe wedge resection, without locally recurrent disease. Mild scarring at the left lung base.  No pleural fluid.  Heart/Mediastinum: No supraclavicular adenopathy. Surgical changes about the chest wall. Aortic and branch vessel atherosclerosis. Normal heart size, without pericardial effusion. No central pulmonary embolism, on this non-dedicated study. No mediastinal or hilar adenopathy. Moderate hiatal hernia.  Upper Abdomen: Right hepatic lobe cyst. Normal imaged portions of the spleen, pancreas, adrenal glands, left Kidney.  Bones/Musculoskeletal:  No acute osseous abnormality.  IMPRESSION: Surgical changes of left upper lobe would resection, without locally recurrent or metastatic disease.   Electronically Signed   By: KAbigail MiyamotoM.D.   On: 07/01/2014 13:29   ASSESSMENT AND PLAN:  this is a very pleasant 73years old white female with history of metastatic non-small cell lung cancer, adenocarcinoma currently on treatment with Tarceva status post 42 months of treatment. Her recent CT scan of the chest showed no evidence for disease progression. I discussed the scan results with the patient and her sister. I recommended for the patient to continue her current treatment with Tarceva with the same dose.  For the history of the breast cancer, the patient will continue on Femara 2.5 mg by mouth daily. She would come back for followup visit in one month for reevaluation and repeat blood work The patient voices understanding of current disease status and treatment options and is in agreement with the current care plan.  All questions were answered. The patient knows to call the clinic with any problems, questions or concerns. We can certainly see the patient much sooner if necessary.  Disclaimer: This note was dictated with voice recognition software. Similar sounding words can inadvertently be transcribed and may not be corrected upon review.

## 2014-07-15 ENCOUNTER — Other Ambulatory Visit: Payer: Self-pay | Admitting: Medical Oncology

## 2014-07-15 DIAGNOSIS — C3412 Malignant neoplasm of upper lobe, left bronchus or lung: Secondary | ICD-10-CM

## 2014-07-15 MED ORDER — ERLOTINIB HCL 150 MG PO TABS: 150.0000 mg | ORAL_TABLET | Freq: Every day | ORAL | Status: DC

## 2014-07-15 NOTE — Telephone Encounter (Signed)
tarceva refill done.

## 2014-08-02 ENCOUNTER — Ambulatory Visit (HOSPITAL_BASED_OUTPATIENT_CLINIC_OR_DEPARTMENT_OTHER): Payer: Medicare Other | Admitting: Internal Medicine

## 2014-08-02 ENCOUNTER — Ambulatory Visit (HOSPITAL_BASED_OUTPATIENT_CLINIC_OR_DEPARTMENT_OTHER): Payer: Medicare Other

## 2014-08-02 ENCOUNTER — Telehealth: Payer: Self-pay | Admitting: Internal Medicine

## 2014-08-02 ENCOUNTER — Other Ambulatory Visit (HOSPITAL_BASED_OUTPATIENT_CLINIC_OR_DEPARTMENT_OTHER): Payer: Medicare Other

## 2014-08-02 ENCOUNTER — Encounter: Payer: Self-pay | Admitting: Internal Medicine

## 2014-08-02 VITALS — BP 142/50 | HR 53 | Temp 97.9°F | Resp 18 | Ht 68.0 in | Wt 145.4 lb

## 2014-08-02 DIAGNOSIS — Z23 Encounter for immunization: Secondary | ICD-10-CM

## 2014-08-02 DIAGNOSIS — C3412 Malignant neoplasm of upper lobe, left bronchus or lung: Secondary | ICD-10-CM

## 2014-08-02 DIAGNOSIS — I10 Essential (primary) hypertension: Secondary | ICD-10-CM

## 2014-08-02 LAB — COMPREHENSIVE METABOLIC PANEL (CC13)
ALT: 15 U/L (ref 0–55)
AST: 18 U/L (ref 5–34)
Albumin: 3 g/dL — ABNORMAL LOW (ref 3.5–5.0)
Alkaline Phosphatase: 78 U/L (ref 40–150)
Anion Gap: 6 mEq/L (ref 3–11)
BILIRUBIN TOTAL: 0.42 mg/dL (ref 0.20–1.20)
BUN: 16.9 mg/dL (ref 7.0–26.0)
CALCIUM: 9.7 mg/dL (ref 8.4–10.4)
CHLORIDE: 107 meq/L (ref 98–109)
CO2: 27 mEq/L (ref 22–29)
CREATININE: 1 mg/dL (ref 0.6–1.1)
Glucose: 116 mg/dl (ref 70–140)
Potassium: 5 mEq/L (ref 3.5–5.1)
Sodium: 140 mEq/L (ref 136–145)
Total Protein: 6.3 g/dL — ABNORMAL LOW (ref 6.4–8.3)

## 2014-08-02 LAB — CBC WITH DIFFERENTIAL/PLATELET
BASO%: 0.6 % (ref 0.0–2.0)
Basophils Absolute: 0 10*3/uL (ref 0.0–0.1)
EOS%: 3.9 % (ref 0.0–7.0)
Eosinophils Absolute: 0.3 10*3/uL (ref 0.0–0.5)
HEMATOCRIT: 33.3 % — AB (ref 34.8–46.6)
HGB: 10.6 g/dL — ABNORMAL LOW (ref 11.6–15.9)
LYMPH#: 1.1 10*3/uL (ref 0.9–3.3)
LYMPH%: 15.8 % (ref 14.0–49.7)
MCH: 28.2 pg (ref 25.1–34.0)
MCHC: 31.8 g/dL (ref 31.5–36.0)
MCV: 88.6 fL (ref 79.5–101.0)
MONO#: 0.9 10*3/uL (ref 0.1–0.9)
MONO%: 12.3 % (ref 0.0–14.0)
NEUT#: 4.7 10*3/uL (ref 1.5–6.5)
NEUT%: 67.4 % (ref 38.4–76.8)
Platelets: 413 10*3/uL — ABNORMAL HIGH (ref 145–400)
RBC: 3.76 10*6/uL (ref 3.70–5.45)
RDW: 14.1 % (ref 11.2–14.5)
WBC: 6.9 10*3/uL (ref 3.9–10.3)

## 2014-08-02 MED ORDER — INFLUENZA VAC SPLIT QUAD 0.5 ML IM SUSY
0.5000 mL | PREFILLED_SYRINGE | Freq: Once | INTRAMUSCULAR | Status: AC
  Administered 2014-08-02: 0.5 mL via INTRAMUSCULAR
  Filled 2014-08-02: qty 0.5

## 2014-08-02 NOTE — Progress Notes (Signed)
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  2) history of breast cancer.   Prior Therapy: None.   Current therapy:  1) Tarceva at 150 mg by mouth daily status post 43 months therapy.  2) Femara 2.5 mg by mouth daily.   CHEMOTHERAPY INTENT: palliative  CURRENT # OF CHEMOTHERAPY CYCLES: 44 CURRENT ANTIEMETICS: Compazine when necessary  CURRENT SMOKING STATUS: Never smoker  ORAL CHEMOTHERAPY AND CONSENT: Tarceva and Femara.  CURRENT BISPHOSPHONATES USE: None  PAIN MANAGEMENT: None  NARCOTICS INDUCED CONSTIPATION: None  LIVING WILL AND CODE STATUS: Full code initially but no prolonged resuscitation.   INTERVAL HISTORY: Kristin [name] 73 y.o. female returns to the clinic today for follow up visit. The patient is feeling fine today with no specific complaints except mild skin rash mainly on the legs. She denied having any significant diarrhea except for once a day and she does not take any medication for it. The patient denied having any chest pain, shortness of breath, cough or hemoptysis. She is tolerating her treatment with Tarceva and Femara fairly well except for the skin rash. She denied having any significant nausea or vomiting, no fever or chills. She has no weight loss or night sweats.   MEDICAL HISTORY: Past Medical History  Diagnosis Date  . Hypertension   . Anemia   . Breast cancer 2002  . Lung cancer dx'd 12/2010  . Stroke     ALLERGIES:  is allergic to sulfa antibiotics.  MEDICATIONS:  Current Outpatient Prescriptions  Medication Sig Dispense Refill  . amLODipine (NORVASC) 5 MG tablet Take 5 mg by mouth daily.        . brimonidine-timolol (COMBIGAN) 0.2-0.5 % ophthalmic solution Place 1 drop into  the right eye every 12 (twelve) hours.      . Calcium Carb-Cholecalciferol (RA CALCIUM 600/VITAMIN D-3) 600-400 MG-UNIT TABS Take 2 tablets by mouth daily.        . clindamycin (CLEOCIN T) 1 % lotion APPLY TWICE DAILY AS DIRECTED  60 mL  1  . clopidogrel (PLAVIX) 75 MG tablet Take 1 tablet (75 mg total) by mouth daily with breakfast.  90 tablet  3  . cycloSPORINE (RESTASIS) 0.05 % ophthalmic emulsion Place 1 drop into both eyes 2 (two) times daily.      Marland Kitchen erlotinib (TARCEVA) 150 MG tablet Take 1 tablet (150 mg total) by mouth daily. Take on an empty stomach 1 hour before meals or 2 hours after.  30 tablet  1  . escitalopram (LEXAPRO) 10 MG tablet Take 15 mg by mouth daily. Takes 64m and 135mtablet daily = 1535maily      . famotidine (PEPCID) 40 MG tablet Take 40 mg by mouth daily.        . FMarland KitchenFum-FePoly-FA-B Cmp-C-Biot (FOLIVANE-PLUS) CAPS TAKE 1 CAPSULE BY MOUTH DAILY  90 capsule  1  . ibuprofen (ADVIL,MOTRIN) 200 MG tablet Take 200 mg by mouth as needed.      . lMarland Kitchentrozole (FEMARA) 2.5 MG tablet Take 1 tablet (2.5 mg total) by mouth daily.  30 tablet  2  . lisinopril (PRINIVIL,ZESTRIL) 10 MG tablet 20 mg.       . zolpidem (AMBIEN) 5 MG tablet at bedtime as needed.       .Marland Kitchen  escitalopram (LEXAPRO) 5 mg TABS tablet Take 5 mg by mouth. Takes 43m and 175mtablet daily = 1570maily      . [DISCONTINUED] erlotinib (TARCEVA) 150 MG tablet Take 1 tablet (150 mg total) by mouth daily.  30 tablet  2   Current Facility-Administered Medications  Medication Dose Route Frequency Provider Last Rate Last Dose  . Influenza vac split quadrivalent PF (FLUARIX) injection 0.5 mL  0.5 mL Intramuscular Once MohCurt BearsD        SURGICAL HISTORY:  Past Surgical History  Procedure Laterality Date  . Appendectomy    . Mastectomy      bilateral  . Lung surgery    . Cataracts      bilateral  . Colonoscopy    . Tonsillectomy    . Tee without cardioversion N/A 06/04/2013    Procedure: TRANSESOPHAGEAL  ECHOCARDIOGRAM (TEE);  Surgeon: DanJolaine ArtistD;  [facility];  Service: Cardiovascular;  Laterality: N/A;    REVIEW OF SYSTEMS:  Constitutional: negative Eyes: negative Ears, nose, mouth, throat, and face: negative Respiratory: negative Cardiovascular: negative Gastrointestinal: negative Genitourinary:negative Integument/breast: positive for rash Hematologic/lymphatic: negative Musculoskeletal:positive for bone pain Neurological: negative Behavioral/Psych: negative Endocrine: negative Allergic/Immunologic: negative   PHYSICAL EXAMINATION: General appearance: alert, cooperative and no distress Head: Normocephalic, without obvious abnormality, atraumatic Neck: no adenopathy, no JVD and thyroid not enlarged, symmetric, no tenderness/mass/nodules Lymph nodes: Cervical, supraclavicular, and axillary nodes normal. Resp: clear to auscultation bilaterally and normal percussion bilaterally Back: symmetric, no curvature. ROM normal. No CVA tenderness. Cardio: regular rate and rhythm, S1, S2 normal, no murmur, click, rub or gallop GI: soft, non-tender; bowel sounds normal; no masses,  no organomegaly Extremities: extremities normal, atraumatic, no cyanosis or edema and Grade 2 skin rash on the lower extremities Neurologic: Alert and oriented X 3, normal strength and tone. Normal symmetric reflexes. Normal coordination and gait  ECOG PERFORMANCE STATUS: 1 - Symptomatic but completely ambulatory  Blood pressure 142/50, pulse 53, temperature 97.9 F (36.6 C), temperature source Oral, resp. rate 18, height 5' 8"  (1.727 m), weight 145 lb 6.4 oz (65.953 kg).  LABORATORY DATA: Lab Results  Component Value Date   WBC 6.9 08/02/2014   HGB 10.6* 08/02/2014   HCT 33.3* 08/02/2014   MCV 88.6 08/02/2014   PLT 413* 08/02/2014      Chemistry      Component Value Date/Time   NA 140 08/02/2014 0951   NA 136 05/31/2014 1058   NA 147* 03/26/2012 1022   K 5.0 08/02/2014 0951   K  4.1 05/31/2014 1058   K 4.8* 03/26/2012 1022   CL 104 05/31/2014 1058   CL 107 04/05/2013 0754   CL 102 03/26/2012 1022   CO2 27 08/02/2014 0951   CO2 26 05/31/2014 1058   CO2 31 03/26/2012 1022   BUN 16.9 08/02/2014 0951   BUN 17 05/31/2014 1058   BUN 14 03/26/2012 1022   CREATININE 1.0 08/02/2014 0951   CREATININE 0.90 05/31/2014 1058   CREATININE 0.7 03/26/2012 1022      Component Value Date/Time   CALCIUM 9.7 08/02/2014 0951   CALCIUM 9.2 05/31/2014 1058   CALCIUM 9.1 03/26/2012 1022   ALKPHOS 78 08/02/2014 0951   ALKPHOS 80 05/31/2014 1058   ALKPHOS 84 03/26/2012 1022   AST 18 08/02/2014 0951   AST 18 05/31/2014 1058   AST 26 03/26/2012 1022   ALT 15 08/02/2014 0951   ALT 13 05/31/2014 1058   ALT 20 03/26/2012 1022  BILITOT 0.42 08/02/2014 0951   BILITOT 0.4 05/31/2014 1058   BILITOT 0.70 03/26/2012 1022        RADIOGRAPHIC STUDIES:  ASSESSMENT AND PLAN: this is a very pleasant 73 years old white female with history of metastatic non-small cell lung cancer, adenocarcinoma currently on treatment with Tarceva status post 43 months of treatment. The patient is feeling fine with no specific complaints and tolerating her treatment well. I recommended for the patient to continue her current treatment with Tarceva with the same dose.  For the history of the breast cancer, the patient will continue on Femara 2.5 mg by mouth daily. She would come back for followup visit in one month for reevaluation and repeat blood work The patient voices understanding of current disease status and treatment options and is in agreement with the current care plan.  All questions were answered. The patient knows to call the clinic with any problems, questions or concerns. We can certainly see the patient much sooner if necessary.  Disclaimer: This note was dictated with voice recognition software. Similar sounding words can inadvertently be transcribed and may not be corrected upon review.

## 2014-08-02 NOTE — Telephone Encounter (Signed)
Pt confirmed labs/ov per 10/13 POF, gave pt AVS.... KJ

## 2014-08-02 NOTE — Patient Instructions (Signed)

## 2014-08-17 ENCOUNTER — Other Ambulatory Visit: Payer: Self-pay | Admitting: Medical Oncology

## 2014-08-17 ENCOUNTER — Other Ambulatory Visit: Payer: Self-pay | Admitting: Internal Medicine

## 2014-08-31 ENCOUNTER — Telehealth: Payer: Self-pay | Admitting: Internal Medicine

## 2014-08-31 ENCOUNTER — Ambulatory Visit (HOSPITAL_BASED_OUTPATIENT_CLINIC_OR_DEPARTMENT_OTHER): Payer: Medicare Other | Admitting: Internal Medicine

## 2014-08-31 ENCOUNTER — Other Ambulatory Visit (HOSPITAL_BASED_OUTPATIENT_CLINIC_OR_DEPARTMENT_OTHER): Payer: Medicare Other

## 2014-08-31 ENCOUNTER — Encounter: Payer: Self-pay | Admitting: Internal Medicine

## 2014-08-31 VITALS — BP 169/68 | HR 78 | Temp 98.3°F | Resp 18 | Ht 68.0 in | Wt 140.6 lb

## 2014-08-31 DIAGNOSIS — Z23 Encounter for immunization: Secondary | ICD-10-CM

## 2014-08-31 DIAGNOSIS — R21 Rash and other nonspecific skin eruption: Secondary | ICD-10-CM

## 2014-08-31 DIAGNOSIS — C3412 Malignant neoplasm of upper lobe, left bronchus or lung: Secondary | ICD-10-CM

## 2014-08-31 DIAGNOSIS — C50919 Malignant neoplasm of unspecified site of unspecified female breast: Secondary | ICD-10-CM

## 2014-08-31 LAB — COMPREHENSIVE METABOLIC PANEL (CC13)
ALK PHOS: 82 U/L (ref 40–150)
ALT: 13 U/L (ref 0–55)
AST: 17 U/L (ref 5–34)
Albumin: 3.2 g/dL — ABNORMAL LOW (ref 3.5–5.0)
Anion Gap: 6 mEq/L (ref 3–11)
BUN: 16.1 mg/dL (ref 7.0–26.0)
CALCIUM: 9.5 mg/dL (ref 8.4–10.4)
CO2: 26 mEq/L (ref 22–29)
CREATININE: 0.9 mg/dL (ref 0.6–1.1)
Chloride: 107 mEq/L (ref 98–109)
Glucose: 107 mg/dl (ref 70–140)
Potassium: 4 mEq/L (ref 3.5–5.1)
Sodium: 139 mEq/L (ref 136–145)
Total Bilirubin: 0.66 mg/dL (ref 0.20–1.20)
Total Protein: 6.4 g/dL (ref 6.4–8.3)

## 2014-08-31 LAB — CBC WITH DIFFERENTIAL/PLATELET
BASO%: 0.7 % (ref 0.0–2.0)
BASOS ABS: 0 10*3/uL (ref 0.0–0.1)
EOS%: 1.7 % (ref 0.0–7.0)
Eosinophils Absolute: 0.1 10*3/uL (ref 0.0–0.5)
HEMATOCRIT: 33.9 % — AB (ref 34.8–46.6)
HEMOGLOBIN: 10.7 g/dL — AB (ref 11.6–15.9)
LYMPH%: 13 % — ABNORMAL LOW (ref 14.0–49.7)
MCH: 27.7 pg (ref 25.1–34.0)
MCHC: 31.6 g/dL (ref 31.5–36.0)
MCV: 87.8 fL (ref 79.5–101.0)
MONO#: 0.9 10*3/uL (ref 0.1–0.9)
MONO%: 12.3 % (ref 0.0–14.0)
NEUT#: 5 10*3/uL (ref 1.5–6.5)
NEUT%: 72.3 % (ref 38.4–76.8)
Platelets: 478 10*3/uL — ABNORMAL HIGH (ref 145–400)
RBC: 3.86 10*6/uL (ref 3.70–5.45)
RDW: 14.7 % — ABNORMAL HIGH (ref 11.2–14.5)
WBC: 6.9 10*3/uL (ref 3.9–10.3)
lymph#: 0.9 10*3/uL (ref 0.9–3.3)

## 2014-08-31 MED ORDER — PROCHLORPERAZINE MALEATE 10 MG PO TABS: 10.0000 mg | ORAL_TABLET | Freq: Four times a day (QID) | ORAL | Status: DC | PRN

## 2014-08-31 NOTE — Progress Notes (Signed)
[place] [phone]   [phone]  OFFICE PROGRESS NOTE  Eber Hong, MD [address] [place] [address]  Principle Diagnosis:  1) Metastatic non-small cell lung cancer adenocarcinoma with positive for EGFR mutation at exon 21 diagnosed in March of 2012.  2) history of breast cancer.   Prior Therapy: None.   Current therapy:  1) Tarceva at 150 mg by mouth daily status post 44 months therapy.  2) Femara 2.5 mg by mouth daily.   CHEMOTHERAPY INTENT: palliative  CURRENT # OF CHEMOTHERAPY CYCLES: 45 CURRENT ANTIEMETICS: Compazine when necessary  CURRENT SMOKING STATUS: Never smoker  ORAL CHEMOTHERAPY AND CONSENT: Tarceva and Femara.  CURRENT BISPHOSPHONATES USE: None  PAIN MANAGEMENT: None  NARCOTICS INDUCED CONSTIPATION: None  LIVING WILL AND CODE STATUS: Full code initially but no prolonged resuscitation.   INTERVAL HISTORY: Kristin [name] 73 y.o. female returns to the clinic today for follow up visit. The patient is feeling fine today with no specific complaints except mild skin rash no significant diarrhea. The patient denied having any chest pain, shortness of breath, cough or hemoptysis. She is tolerating her treatment with Tarceva and Femara fairly well except for the skin rash. She denied having any significant nausea or vomiting, no fever or chills. She has no weight loss or night sweats.   MEDICAL HISTORY: Past Medical History  Diagnosis Date  . Hypertension   . Anemia   . Breast cancer 2002  . Lung cancer dx'd 12/2010  . Stroke     ALLERGIES:  is allergic to sulfa antibiotics.  MEDICATIONS:  Current Outpatient Prescriptions  Medication Sig Dispense Refill  . amLODipine (NORVASC) 5 MG tablet Take 5 mg by mouth daily.      . brimonidine-timolol (COMBIGAN) 0.2-0.5 % ophthalmic solution Place 1 drop into the right eye every 12 (twelve) hours.    . Calcium Carb-Cholecalciferol (RA CALCIUM 600/VITAMIN D-3)  600-400 MG-UNIT TABS Take 2 tablets by mouth daily.      . clindamycin (CLEOCIN T) 1 % lotion APPLY TWICE DAILY AS DIRECTED 60 mL 1  . clopidogrel (PLAVIX) 75 MG tablet Take 1 tablet (75 mg total) by mouth daily with breakfast. 90 tablet 3  . cycloSPORINE (RESTASIS) 0.05 % ophthalmic emulsion Place 1 drop into both eyes 2 (two) times daily.    Marland Kitchen escitalopram (LEXAPRO) 10 MG tablet Take 10 mg by mouth daily. Takes 70m and 114mtablet daily = 1558maily    . famotidine (PEPCID) 40 MG tablet Take 40 mg by mouth daily.      . FMarland KitchenFum-FePoly-FA-B Cmp-C-Biot (FOLIVANE-PLUS) CAPS TAKE 1 CAPSULE BY MOUTH DAILY 90 capsule 1  . ibuprofen (ADVIL,MOTRIN) 200 MG tablet Take 200 mg by mouth as needed.    . lMarland Kitchentrozole (FEMARA) 2.5 MG tablet Take 1 tablet (2.5 mg total) by mouth daily. 30 tablet 2  . lisinopril (PRINIVIL,ZESTRIL) 10 MG tablet 20 mg.     . TARCEVA 150 MG tablet TAKE 1 TABLET BY MOUTH DAILY. 30 tablet 1  . zolpidem (AMBIEN) 5 MG tablet at bedtime as needed.     . prochlorperazine (COMPAZINE) 10 MG tablet Take 1 tablet (10 mg total) by mouth every 6 (six) hours as needed for nausea or vomiting. 30 tablet 0  . [DISCONTINUED] erlotinib (TARCEVA) 150 MG tablet Take 1 tablet (150 mg total) by mouth daily. 30 tablet 2   No current facility-administered medications for this visit.    SURGICAL HISTORY:  Past Surgical History  Procedure Laterality Date  . Appendectomy    . Mastectomy      bilateral  . Lung surgery    . Cataracts      bilateral  . Colonoscopy    . Tonsillectomy    . Tee without cardioversion N/A 06/04/2013    Procedure: TRANSESOPHAGEAL ECHOCARDIOGRAM (TEE);  Surgeon: Jolaine Artist, MD;  Location: [facility] ENDOSCOPY;  Service: Cardiovascular;  Laterality: N/A;    REVIEW OF SYSTEMS:  Constitutional: negative Eyes: negative Ears, nose, mouth, throat, and face: negative Respiratory: negative Cardiovascular: negative Gastrointestinal:  negative Genitourinary:negative Integument/breast: positive for rash Hematologic/lymphatic: negative Musculoskeletal:positive for bone pain Neurological: negative Behavioral/Psych: negative Endocrine: negative Allergic/Immunologic: negative   PHYSICAL EXAMINATION: General appearance: alert, cooperative and no distress Head: Normocephalic, without obvious abnormality, atraumatic Neck: no adenopathy, no JVD and thyroid not enlarged, symmetric, no tenderness/mass/nodules Lymph nodes: Cervical, supraclavicular, and axillary nodes normal. Resp: clear to auscultation bilaterally and normal percussion bilaterally Back: symmetric, no curvature. ROM normal. No CVA tenderness. Cardio: regular rate and rhythm, S1, S2 normal, no murmur, click, rub or gallop GI: soft, non-tender; bowel sounds normal; no masses,  no organomegaly Extremities: extremities normal, atraumatic, no cyanosis or edema and Grade 2 skin rash on the lower extremities Neurologic: Alert and oriented X 3, normal strength and tone. Normal symmetric reflexes. Normal coordination and gait  ECOG PERFORMANCE STATUS: 1 - Symptomatic but completely ambulatory  Blood pressure 169/68, pulse 78, temperature 98.3 F (36.8 C), temperature source Oral, resp. rate 18, height 5' 8"  (1.727 m), weight 140 lb 9.6 oz (63.776 kg), SpO2 100 %.  LABORATORY DATA: Lab Results  Component Value Date   WBC 6.9 08/31/2014   HGB 10.7* 08/31/2014   HCT 33.9* 08/31/2014   MCV 87.8 08/31/2014   PLT 478* 08/31/2014      Chemistry      Component Value Date/Time   NA 139 08/31/2014 1038   NA 136 05/31/2014 1058   NA 147* 03/26/2012 1022   K 4.0 08/31/2014 1038   K 4.1 05/31/2014 1058   K 4.8* 03/26/2012 1022   CL 104 05/31/2014 1058   CL 107 04/05/2013 0754   CL 102 03/26/2012 1022   CO2 26 08/31/2014 1038   CO2 26 05/31/2014 1058   CO2 31 03/26/2012 1022   BUN 16.1 08/31/2014 1038   BUN 17 05/31/2014 1058   BUN 14 03/26/2012 1022    CREATININE 0.9 08/31/2014 1038   CREATININE 0.90 05/31/2014 1058   CREATININE 0.7 03/26/2012 1022      Component Value Date/Time   CALCIUM 9.5 08/31/2014 1038   CALCIUM 9.2 05/31/2014 1058   CALCIUM 9.1 03/26/2012 1022   ALKPHOS 82 08/31/2014 1038   ALKPHOS 80 05/31/2014 1058   ALKPHOS 84 03/26/2012 1022   AST 17 08/31/2014 1038   AST 18 05/31/2014 1058   AST 26 03/26/2012 1022   ALT 13 08/31/2014 1038   ALT 13 05/31/2014 1058   ALT 20 03/26/2012 1022   BILITOT 0.66 08/31/2014 1038   BILITOT 0.4 05/31/2014 1058   BILITOT 0.70 03/26/2012 1022        RADIOGRAPHIC STUDIES:  ASSESSMENT AND PLAN: this is a very pleasant 73 years old white female with history of metastatic non-small cell lung cancer, adenocarcinoma currently on treatment with Tarceva status post 44 months of treatment. The patient is feeling fine with no specific complaints and tolerating her treatment well. I recommended for the patient to continue her current treatment with Tarceva with the same dose.  For the history of the breast cancer, the patient will continue on Femara 2.5 mg by mouth daily. She would come back for followup visit in one month for reevaluation after repeating CT scan of the chest for restaging of her disease. The patient voices understanding of current disease status and treatment options and is in agreement with the current care plan.  All questions were answered. The patient knows to call the clinic with any problems, questions or concerns. We can certainly see the patient much sooner if necessary.  Disclaimer: This note was dictated with voice recognition software. Similar sounding words can inadvertently be transcribed and may not be corrected upon review.

## 2014-08-31 NOTE — Telephone Encounter (Signed)
Gave avs & cal for Dec. °

## 2014-09-02 ENCOUNTER — Encounter: Payer: Self-pay | Admitting: *Deleted

## 2014-09-02 NOTE — Progress Notes (Signed)
RECEIVED A FAX FROM [facility] CONCERNING A CONFIRMATION OF PT. REFERRAL FOR THE MEDICATION, PROCHLORPERAZINE.

## 2014-09-19 ENCOUNTER — Other Ambulatory Visit: Payer: Self-pay | Admitting: *Deleted

## 2014-09-19 DIAGNOSIS — C3412 Malignant neoplasm of upper lobe, left bronchus or lung: Secondary | ICD-10-CM

## 2014-09-19 MED ORDER — PROCHLORPERAZINE MALEATE 10 MG PO TABS: 10.0000 mg | ORAL_TABLET | Freq: Four times a day (QID) | ORAL | Status: DC | PRN

## 2014-09-21 ENCOUNTER — Encounter (HOSPITAL_COMMUNITY): Payer: Self-pay

## 2014-09-21 ENCOUNTER — Other Ambulatory Visit: Payer: Medicare Other

## 2014-09-21 ENCOUNTER — Ambulatory Visit (HOSPITAL_COMMUNITY)
Admission: RE | Admit: 2014-09-21 | Discharge: 2014-09-21 | Disposition: A | Discharge status: Home / Self Care | Payer: Medicare Other | Source: Ambulatory Visit | Attending: Internal Medicine | Admitting: Internal Medicine

## 2014-09-21 ENCOUNTER — Other Ambulatory Visit (HOSPITAL_BASED_OUTPATIENT_CLINIC_OR_DEPARTMENT_OTHER): Payer: Medicare Other

## 2014-09-21 DIAGNOSIS — C50912 Malignant neoplasm of unspecified site of left female breast: Secondary | ICD-10-CM | POA: Insufficient documentation

## 2014-09-21 DIAGNOSIS — C50911 Malignant neoplasm of unspecified site of right female breast: Secondary | ICD-10-CM | POA: Diagnosis not present

## 2014-09-21 DIAGNOSIS — C3410 Malignant neoplasm of upper lobe, unspecified bronchus or lung: Secondary | ICD-10-CM

## 2014-09-21 DIAGNOSIS — C3412 Malignant neoplasm of upper lobe, left bronchus or lung: Secondary | ICD-10-CM | POA: Diagnosis present

## 2014-09-21 DIAGNOSIS — C50919 Malignant neoplasm of unspecified site of unspecified female breast: Secondary | ICD-10-CM

## 2014-09-21 DIAGNOSIS — Z79899 Other long term (current) drug therapy: Secondary | ICD-10-CM | POA: Insufficient documentation

## 2014-09-21 LAB — CBC WITH DIFFERENTIAL/PLATELET
BASO%: 1 % (ref 0.0–2.0)
BASOS ABS: 0.1 10*3/uL (ref 0.0–0.1)
EOS ABS: 0.3 10*3/uL (ref 0.0–0.5)
EOS%: 3.6 % (ref 0.0–7.0)
HCT: 34.6 % — ABNORMAL LOW (ref 34.8–46.6)
HGB: 11 g/dL — ABNORMAL LOW (ref 11.6–15.9)
LYMPH%: 16.8 % (ref 14.0–49.7)
MCH: 27.9 pg (ref 25.1–34.0)
MCHC: 31.7 g/dL (ref 31.5–36.0)
MCV: 88 fL (ref 79.5–101.0)
MONO#: 0.8 10*3/uL (ref 0.1–0.9)
MONO%: 10.9 % (ref 0.0–14.0)
NEUT%: 67.7 % (ref 38.4–76.8)
NEUTROS ABS: 4.9 10*3/uL (ref 1.5–6.5)
PLATELETS: 489 10*3/uL — AB (ref 145–400)
RBC: 3.93 10*6/uL (ref 3.70–5.45)
RDW: 14.5 % (ref 11.2–14.5)
WBC: 7.2 10*3/uL (ref 3.9–10.3)
lymph#: 1.2 10*3/uL (ref 0.9–3.3)

## 2014-09-21 LAB — COMPREHENSIVE METABOLIC PANEL (CC13)
ALBUMIN: 3.3 g/dL — AB (ref 3.5–5.0)
ALT: 15 U/L (ref 0–55)
ANION GAP: 8 meq/L (ref 3–11)
AST: 18 U/L (ref 5–34)
Alkaline Phosphatase: 91 U/L (ref 40–150)
BUN: 18.2 mg/dL (ref 7.0–26.0)
CALCIUM: 9.8 mg/dL (ref 8.4–10.4)
CO2: 27 meq/L (ref 22–29)
Chloride: 107 mEq/L (ref 98–109)
Creatinine: 0.9 mg/dL (ref 0.6–1.1)
GLUCOSE: 99 mg/dL (ref 70–140)
POTASSIUM: 4.6 meq/L (ref 3.5–5.1)
Sodium: 142 mEq/L (ref 136–145)
Total Bilirubin: 0.61 mg/dL (ref 0.20–1.20)
Total Protein: 6.5 g/dL (ref 6.4–8.3)

## 2014-09-21 MED ORDER — IOHEXOL 300 MG/ML  SOLN
80.0000 mL | Freq: Once | INTRAMUSCULAR | Status: AC | PRN
  Administered 2014-09-21: 80 mL via INTRAVENOUS

## 2014-09-28 ENCOUNTER — Ambulatory Visit (HOSPITAL_BASED_OUTPATIENT_CLINIC_OR_DEPARTMENT_OTHER): Payer: Medicare Other | Admitting: Internal Medicine

## 2014-09-28 ENCOUNTER — Encounter: Payer: Self-pay | Admitting: Internal Medicine

## 2014-09-28 ENCOUNTER — Telehealth: Payer: Self-pay | Admitting: Internal Medicine

## 2014-09-28 VITALS — BP 162/50 | HR 55 | Temp 97.8°F | Resp 18 | Ht 68.0 in | Wt 140.3 lb

## 2014-09-28 DIAGNOSIS — C50919 Malignant neoplasm of unspecified site of unspecified female breast: Secondary | ICD-10-CM

## 2014-09-28 DIAGNOSIS — C3412 Malignant neoplasm of upper lobe, left bronchus or lung: Secondary | ICD-10-CM

## 2014-09-28 NOTE — Progress Notes (Signed)
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  2) history of breast cancer.   Prior Therapy: None.   Current therapy:  1) Tarceva at 150 mg by mouth daily status post 45 months therapy.  2) Femara 2.5 mg by mouth daily.   CHEMOTHERAPY INTENT: palliative  CURRENT # OF CHEMOTHERAPY CYCLES: 46 CURRENT ANTIEMETICS: Compazine when necessary  CURRENT SMOKING STATUS: Never smoker  ORAL CHEMOTHERAPY AND CONSENT: Tarceva and Femara.  CURRENT BISPHOSPHONATES USE: None  PAIN MANAGEMENT: None  NARCOTICS INDUCED CONSTIPATION: None  LIVING WILL AND CODE STATUS: Full code initially but no prolonged resuscitation.   INTERVAL HISTORY: Kristin [name] 73 y.o. female returns to the clinic today for follow up visit accompanied by her daughter [name]. The patient is feeling fine today with no specific complaints except mild skin rash with no significant diarrhea. She also has occasional aching pain on the left hip area which was evaluated in the past and was secondary to degenerative disease. The patient denied having any chest pain, shortness of breath, cough or hemoptysis. She is tolerating her treatment with Tarceva and Femara fairly well except for the skin rash. She denied having any significant nausea or vomiting, no fever or chills. She has no weight loss or night sweats. She had repeat CT scan of the chest performed recently and she is here for evaluation and discussion of her scan results.  MEDICAL HISTORY: Past Medical History  Diagnosis Date  . Hypertension   . Anemia   . Stroke   . Breast cancer 2002    bilateral  . Lung cancer dx'd 12/2010    ALLERGIES:  is allergic to sulfa antibiotics.  MEDICATIONS:  Current  Outpatient Prescriptions  Medication Sig Dispense Refill  . amLODipine (NORVASC) 5 MG tablet Take 5 mg by mouth daily.      . brimonidine-timolol (COMBIGAN) 0.2-0.5 % ophthalmic solution Place 1 drop into the right eye every 12 (twelve) hours.    . Calcium Carb-Cholecalciferol (RA CALCIUM 600/VITAMIN D-3) 600-400 MG-UNIT TABS Take 2 tablets by mouth daily.      . clindamycin (CLEOCIN T) 1 % lotion APPLY TWICE DAILY AS DIRECTED 60 mL 1  . clopidogrel (PLAVIX) 75 MG tablet Take 1 tablet (75 mg total) by mouth daily with breakfast. 90 tablet 3  . cycloSPORINE (RESTASIS) 0.05 % ophthalmic emulsion Place 1 drop into both eyes 2 (two) times daily.    Marland Kitchen escitalopram (LEXAPRO) 10 MG tablet Take 10 mg by mouth daily. Takes 75m and 181mtablet daily = 1576maily    . famotidine (PEPCID) 40 MG tablet Take 40 mg by mouth daily.      . FMarland KitchenFum-FePoly-FA-B Cmp-C-Biot (FOLIVANE-PLUS) CAPS TAKE 1 CAPSULE BY MOUTH DAILY 90 capsule 1  . ibuprofen (ADVIL,MOTRIN) 200 MG tablet Take 200 mg by mouth as needed.    . lMarland Kitchentrozole (FEMARA) 2.5 MG tablet Take 1 tablet (2.5 mg total) by mouth daily. 30 tablet 2  . lisinopril (PRINIVIL,ZESTRIL) 10 MG tablet 20 mg.     . prochlorperazine (COMPAZINE) 10 MG tablet Take 1 tablet (10 mg total) by mouth every 6 (six) hours as needed for nausea or vomiting. 30 tablet 0  . TARCEVA 150 MG tablet TAKE 1 TABLET BY MOUTH DAILY. 30 tablet  1  . zolpidem (AMBIEN) 5 MG tablet at bedtime as needed.     . [DISCONTINUED] erlotinib (TARCEVA) 150 MG tablet Take 1 tablet (150 mg total) by mouth daily. 30 tablet 2   No current facility-administered medications for this visit.    SURGICAL HISTORY:  Past Surgical History  Procedure Laterality Date  . Appendectomy    . Mastectomy      bilateral  . Lung surgery    . Cataracts      bilateral  . Colonoscopy    . Tonsillectomy    . Tee without cardioversion N/A 06/04/2013    Procedure: TRANSESOPHAGEAL ECHOCARDIOGRAM (TEE);  Surgeon: Jolaine Artist, MD;  Location: [facility] ENDOSCOPY;  Service: Cardiovascular;  Laterality: N/A;    REVIEW OF SYSTEMS:  Constitutional: negative Eyes: negative Ears, nose, mouth, throat, and face: negative Respiratory: negative Cardiovascular: negative Gastrointestinal: negative Genitourinary:negative Integument/breast: positive for rash Hematologic/lymphatic: negative Musculoskeletal:positive for bone pain Neurological: negative Behavioral/Psych: negative Endocrine: negative Allergic/Immunologic: negative   PHYSICAL EXAMINATION: General appearance: alert, cooperative and no distress Head: Normocephalic, without obvious abnormality, atraumatic Neck: no adenopathy, no JVD and thyroid not enlarged, symmetric, no tenderness/mass/nodules Lymph nodes: Cervical, supraclavicular, and axillary nodes normal. Resp: clear to auscultation bilaterally and normal percussion bilaterally Back: symmetric, no curvature. ROM normal. No CVA tenderness. Cardio: regular rate and rhythm, S1, S2 normal, no murmur, click, rub or gallop GI: soft, non-tender; bowel sounds normal; no masses,  no organomegaly Extremities: extremities normal, atraumatic, no cyanosis or edema and Grade 2 skin rash on the lower extremities Neurologic: Alert and oriented X 3, normal strength and tone. Normal symmetric reflexes. Normal coordination and gait  ECOG PERFORMANCE STATUS: 1 - Symptomatic but completely ambulatory  Blood pressure 162/50, pulse 55, temperature 97.8 F (36.6 C), temperature source Oral, resp. rate 18, height 5' 8"  (1.727 m), weight 140 lb 4.8 oz (63.64 kg).  LABORATORY DATA: Lab Results  Component Value Date   WBC 7.2 09/21/2014   HGB 11.0* 09/21/2014   HCT 34.6* 09/21/2014   MCV 88.0 09/21/2014   PLT 489* 09/21/2014      Chemistry      Component Value Date/Time   NA 142 09/21/2014 1002   NA 136 05/31/2014 1058   NA 147* 03/26/2012 1022   K 4.6 09/21/2014 1002   K 4.1 05/31/2014 1058   K 4.8* 03/26/2012  1022   CL 104 05/31/2014 1058   CL 107 04/05/2013 0754   CL 102 03/26/2012 1022   CO2 27 09/21/2014 1002   CO2 26 05/31/2014 1058   CO2 31 03/26/2012 1022   BUN 18.2 09/21/2014 1002   BUN 17 05/31/2014 1058   BUN 14 03/26/2012 1022   CREATININE 0.9 09/21/2014 1002   CREATININE 0.90 05/31/2014 1058   CREATININE 0.7 03/26/2012 1022      Component Value Date/Time   CALCIUM 9.8 09/21/2014 1002   CALCIUM 9.2 05/31/2014 1058   CALCIUM 9.1 03/26/2012 1022   ALKPHOS 91 09/21/2014 1002   ALKPHOS 80 05/31/2014 1058   ALKPHOS 84 03/26/2012 1022   AST 18 09/21/2014 1002   AST 18 05/31/2014 1058   AST 26 03/26/2012 1022   ALT 15 09/21/2014 1002   ALT 13 05/31/2014 1058   ALT 20 03/26/2012 1022   BILITOT 0.61 09/21/2014 1002   BILITOT 0.4 05/31/2014 1058   BILITOT 0.70 03/26/2012 1022        RADIOGRAPHIC STUDIES: Ct Chest W Contrast  09/21/2014   CLINICAL DATA:  73 year old female  with history of lung cancer and bilateral breast cancer undergoing ongoing chemotherapy.  EXAM: CT CHEST WITH CONTRAST  TECHNIQUE: Multidetector CT imaging of the chest was performed during intravenous contrast administration.  CONTRAST:  60m OMNIPAQUE IOHEXOL 300 MG/ML  SOLN  COMPARISON:  CT of the chest 07/01/2014.  FINDINGS: Mediastinum: Heart size is normal. There is no significant pericardial fluid, thickening or pericardial calcification. No pathologically enlarged mediastinal, hilar or bilateral internal mammary lymph nodes. Moderate hiatal hernia. Atherosclerosis in the thoracic aorta and great vessels of the mediastinum, without evidence of aneurysm or dissection.  Lungs/Pleura: Status post left upper lobe wedge resection. No soft tissue lesion along the resection line to suggest local recurrence of disease. No suspicious appearing pulmonary nodules or masses. No acute consolidative airspace disease. No pleural effusions.  Upper Abdomen: 2.5 cm simple cysts in segment 8 of the liver is unchanged.  Atherosclerosis.  Musculoskeletal: Status post bilateral modified radical mastectomy, right axillary nodal dissection, and bilateral breast reconstruction. No axillary or subpectoral lymphadenopathy noted on today's examination.  IMPRESSION: 1. Stable postoperative changes of left upper lobe wedge resection without findings to suggest local recurrence of disease or metastatic disease in the thorax. 2. Status post bilateral modified radical mastectomy, right axillary nodal dissection and bilateral breast reconstruction, without findings to suggest locally recurrent disease. 3. Moderate size hiatal hernia again noted. 4. Atherosclerosis.   Electronically Signed   By: DVinnie LangtonM.D.   On: 09/21/2014 13:00    ASSESSMENT AND PLAN: this is a very pleasant 73years old white female with history of metastatic non-small cell lung cancer, adenocarcinoma currently on treatment with Tarceva status post 45 months of treatment. The patient is feeling fine with no specific complaints and tolerating her treatment well. The recent CT scan of the chest showed no evidence for disease recurrence or metastatic disease in the chest. I recommended for the patient to continue her current treatment with Tarceva with the same dose.  For the history of the breast cancer, the patient will continue on Femara 2.5 mg by mouth daily. She would come back for followup visit in one month for reevaluation and management of any adverse effect of her treatment. The patient voices understanding of current disease status and treatment options and is in agreement with the current care plan.  All questions were answered. The patient knows to call the clinic with any problems, questions or concerns. We can certainly see the patient much sooner if necessary.  Disclaimer: This note was dictated with voice recognition software. Similar sounding words can inadvertently be transcribed and may not be corrected upon review.

## 2014-09-28 NOTE — Telephone Encounter (Signed)
Pt confirmed labs/ov per 12/09 POF, gave pt AVS.... KJ °

## 2014-10-25 ENCOUNTER — Other Ambulatory Visit: Payer: Self-pay | Admitting: *Deleted

## 2014-10-25 NOTE — Telephone Encounter (Signed)
THIS REFILL REQUEST FOR TARCEVA WAS GIVEN TO DR.MOHAMED'S NURSE, [place]

## 2014-10-26 ENCOUNTER — Ambulatory Visit (HOSPITAL_BASED_OUTPATIENT_CLINIC_OR_DEPARTMENT_OTHER): Payer: Medicare Other | Admitting: Physician Assistant

## 2014-10-26 ENCOUNTER — Encounter: Payer: Self-pay | Admitting: Physician Assistant

## 2014-10-26 ENCOUNTER — Other Ambulatory Visit: Payer: Self-pay | Admitting: *Deleted

## 2014-10-26 ENCOUNTER — Other Ambulatory Visit (HOSPITAL_BASED_OUTPATIENT_CLINIC_OR_DEPARTMENT_OTHER): Payer: Medicare Other

## 2014-10-26 ENCOUNTER — Telehealth: Payer: Self-pay | Admitting: Physician Assistant

## 2014-10-26 VITALS — BP 146/40 | HR 59 | Temp 97.7°F | Resp 18 | Ht 68.0 in

## 2014-10-26 DIAGNOSIS — C779 Secondary and unspecified malignant neoplasm of lymph node, unspecified: Secondary | ICD-10-CM

## 2014-10-26 DIAGNOSIS — C3412 Malignant neoplasm of upper lobe, left bronchus or lung: Secondary | ICD-10-CM

## 2014-10-26 DIAGNOSIS — C50919 Malignant neoplasm of unspecified site of unspecified female breast: Secondary | ICD-10-CM

## 2014-10-26 DIAGNOSIS — R21 Rash and other nonspecific skin eruption: Secondary | ICD-10-CM

## 2014-10-26 LAB — CBC WITH DIFFERENTIAL/PLATELET
BASO%: 0.6 % (ref 0.0–2.0)
BASOS ABS: 0 10*3/uL (ref 0.0–0.1)
EOS%: 3.2 % (ref 0.0–7.0)
Eosinophils Absolute: 0.2 10*3/uL (ref 0.0–0.5)
HEMATOCRIT: 34.1 % — AB (ref 34.8–46.6)
HGB: 10.7 g/dL — ABNORMAL LOW (ref 11.6–15.9)
LYMPH%: 18 % (ref 14.0–49.7)
MCH: 27.8 pg (ref 25.1–34.0)
MCHC: 31.4 g/dL — ABNORMAL LOW (ref 31.5–36.0)
MCV: 88.6 fL (ref 79.5–101.0)
MONO#: 0.8 10*3/uL (ref 0.1–0.9)
MONO%: 12.2 % (ref 0.0–14.0)
NEUT#: 4.1 10*3/uL (ref 1.5–6.5)
NEUT%: 66 % (ref 38.4–76.8)
PLATELETS: 481 10*3/uL — AB (ref 145–400)
RBC: 3.85 10*6/uL (ref 3.70–5.45)
RDW: 14.1 % (ref 11.2–14.5)
WBC: 6.2 10*3/uL (ref 3.9–10.3)
lymph#: 1.1 10*3/uL (ref 0.9–3.3)

## 2014-10-26 LAB — COMPREHENSIVE METABOLIC PANEL (CC13)
ALBUMIN: 3.2 g/dL — AB (ref 3.5–5.0)
ALK PHOS: 92 U/L (ref 40–150)
ALT: 12 U/L (ref 0–55)
AST: 17 U/L (ref 5–34)
Anion Gap: 5 mEq/L (ref 3–11)
BUN: 23.3 mg/dL (ref 7.0–26.0)
CO2: 29 mEq/L (ref 22–29)
Calcium: 9.5 mg/dL (ref 8.4–10.4)
Chloride: 107 mEq/L (ref 98–109)
Creatinine: 0.9 mg/dL (ref 0.6–1.1)
EGFR: 66 mL/min/{1.73_m2} — ABNORMAL LOW (ref >90)
Glucose: 107 mg/dl (ref 70–140)
Potassium: 4.2 mEq/L (ref 3.5–5.1)
Sodium: 140 mEq/L (ref 136–145)
TOTAL PROTEIN: 6.2 g/dL — AB (ref 6.4–8.3)
Total Bilirubin: 0.52 mg/dL (ref 0.20–1.20)

## 2014-10-26 MED ORDER — ERLOTINIB HCL 150 MG PO TABS: 150.0000 mg | ORAL_TABLET | Freq: Every day | ORAL | Status: DC

## 2014-10-26 NOTE — Telephone Encounter (Signed)
Pt confirmed labs/ov per 01/06 POF, gave pt AVS.... KJ

## 2014-10-26 NOTE — Progress Notes (Addendum)
[place] [phone]   [phone]  OFFICE PROGRESS NOTE  Eber Hong, MD [address] [place] [address]  Principle Diagnosis:  1) Metastatic non-small cell lung cancer adenocarcinoma with positive for EGFR mutation at exon 21 diagnosed in March of 2012.  2) history of breast cancer.   Prior Therapy: None.   Current therapy:  1) Tarceva at 150 mg by mouth daily status post 46 months therapy.  2) Femara 2.5 mg by mouth daily.   CHEMOTHERAPY INTENT: palliative  CURRENT # OF CHEMOTHERAPY CYCLES: 47 CURRENT ANTIEMETICS: Compazine when necessary  CURRENT SMOKING STATUS: Never smoker  ORAL CHEMOTHERAPY AND CONSENT: Tarceva and Femara.  CURRENT BISPHOSPHONATES USE: None  PAIN MANAGEMENT: None  NARCOTICS INDUCED CONSTIPATION: None  LIVING WILL AND CODE STATUS: Full code initially but no prolonged resuscitation.   INTERVAL HISTORY: Kristin [name] 74 y.o. female returns to the clinic today for follow up visit.  The patient is feeling fine today with no specific complaints except mild skin rash with no significant diarrhea. She reports some increased left hip pain related to osteoarthritis. She also continues to have complaints of dry eyes. She is using some eyedrops and is following up with her ophthalmologist.  The patient denied having any chest pain, shortness of breath, cough or hemoptysis. She is tolerating her treatment with Tarceva and Femara fairly well except for the skin rash. She denied having any significant nausea or vomiting, no fever or chills. She has no weight loss or night sweats.   MEDICAL HISTORY: Past Medical History  Diagnosis Date  . Hypertension   . Anemia   . Stroke   . Breast cancer 2002    bilateral  . Lung cancer dx'd 12/2010    ALLERGIES:  is allergic to sulfa antibiotics.  MEDICATIONS:  Current Outpatient Prescriptions  Medication Sig Dispense Refill  . amLODipine (NORVASC) 5 MG tablet Take 5 mg  by mouth daily.      . brimonidine-timolol (COMBIGAN) 0.2-0.5 % ophthalmic solution Place 1 drop into the right eye every 12 (twelve) hours.    . Calcium Carb-Cholecalciferol (RA CALCIUM 600/VITAMIN D-3) 600-400 MG-UNIT TABS Take 2 tablets by mouth daily.      . clindamycin (CLEOCIN T) 1 % lotion APPLY TWICE DAILY AS DIRECTED 60 mL 1  . clopidogrel (PLAVIX) 75 MG tablet Take 1 tablet (75 mg total) by mouth daily with breakfast. 90 tablet 3  . cycloSPORINE (RESTASIS) 0.05 % ophthalmic emulsion Place 1 drop into both eyes 2 (two) times daily.    Marland Kitchen erlotinib (TARCEVA) 150 MG tablet Take 1 tablet (150 mg total) by mouth daily. Take on an empty stomach 1 hour before meals or 2 hours after. 30 tablet 1  . escitalopram (LEXAPRO) 10 MG tablet Take 10 mg by mouth daily. Takes 72m and 120mtablet daily = 1570maily    . famotidine (PEPCID) 40 MG tablet Take 40 mg by mouth daily.      . FMarland KitchenFum-FePoly-FA-B Cmp-C-Biot (FOLIVANE-PLUS) CAPS TAKE 1 CAPSULE BY MOUTH DAILY 90 capsule 1  . ibuprofen (ADVIL,MOTRIN) 200 MG tablet Take 200 mg by mouth as needed.    . lMarland Kitchentrozole (FEMARA) 2.5 MG tablet Take 1 tablet (2.5 mg total) by mouth daily. 30 tablet 2  . lisinopril (PRINIVIL,ZESTRIL) 10 MG tablet 20 mg.     . zolpidem (AMBIEN) 5 MG tablet at bedtime as needed.     . prochlorperazine (COMPAZINE) 10 MG tablet Take 1 tablet (10 mg  total) by mouth every 6 (six) hours as needed for nausea or vomiting. (Patient not taking: Reported on 10/26/2014) 30 tablet 0  . [DISCONTINUED] erlotinib (TARCEVA) 150 MG tablet Take 1 tablet (150 mg total) by mouth daily. 30 tablet 2   No current facility-administered medications for this visit.    SURGICAL HISTORY:  Past Surgical History  Procedure Laterality Date  . Appendectomy    . Mastectomy      bilateral  . Lung surgery    . Cataracts      bilateral  . Colonoscopy    . Tonsillectomy    . Tee without cardioversion N/A 06/04/2013    Procedure: TRANSESOPHAGEAL ECHOCARDIOGRAM  (TEE);  Surgeon: Jolaine Artist, MD;  Location: [facility] ENDOSCOPY;  Service: Cardiovascular;  Laterality: N/A;    REVIEW OF SYSTEMS:  Constitutional: negative Eyes: negative Ears, nose, mouth, throat, and face: negative Respiratory: negative Cardiovascular: negative Gastrointestinal: negative Genitourinary:negative Integument/breast: positive for rash Hematologic/lymphatic: negative Musculoskeletal:positive for bone pain Neurological: negative Behavioral/Psych: negative Endocrine: negative Allergic/Immunologic: negative   PHYSICAL EXAMINATION: General appearance: alert, cooperative and no distress Head: Normocephalic, without obvious abnormality, atraumatic Neck: no adenopathy, no JVD and thyroid not enlarged, symmetric, no tenderness/mass/nodules Lymph nodes: Cervical, supraclavicular, and axillary nodes normal. Resp: clear to auscultation bilaterally and normal percussion bilaterally Back: symmetric, no curvature. ROM normal. No CVA tenderness. Cardio: regular rate and rhythm, S1, S2 normal, no murmur, click, rub or gallop GI: soft, non-tender; bowel sounds normal; no masses,  no organomegaly Extremities: extremities normal, atraumatic, no cyanosis or edema and Grade 2 skin rash on the lower extremities Neurologic: Alert and oriented X 3, normal strength and tone. Normal symmetric reflexes. Normal coordination and gait  ECOG PERFORMANCE STATUS: 1 - Symptomatic but completely ambulatory  Blood pressure 146/40, pulse 59, temperature 97.7 F (36.5 C), temperature source Oral, resp. rate 18, height _0  (1.727 m), SpO2 100 %.  LABORATORY DATA: Lab Results  Component Value Date   WBC 6.2 10/26/2014   HGB 10.7* 10/26/2014   HCT 34.1* 10/26/2014   MCV 88.6 10/26/2014   PLT 481* 10/26/2014      Chemistry      Component Value Date/Time   NA 140 10/26/2014 1002   NA 136 05/31/2014 1058   NA 147* 03/26/2012 1022   K 4.2 10/26/2014 1002   K 4.1 05/31/2014 1058   K 4.8*  03/26/2012 1022   CL 104 05/31/2014 1058   CL 107 04/05/2013 0754   CL 102 03/26/2012 1022   CO2 29 10/26/2014 1002   CO2 26 05/31/2014 1058   CO2 31 03/26/2012 1022   BUN 23.3 10/26/2014 1002   BUN 17 05/31/2014 1058   BUN 14 03/26/2012 1022   CREATININE 0.9 10/26/2014 1002   CREATININE 0.90 05/31/2014 1058   CREATININE 0.7 03/26/2012 1022      Component Value Date/Time   CALCIUM 9.5 10/26/2014 1002   CALCIUM 9.2 05/31/2014 1058   CALCIUM 9.1 03/26/2012 1022   ALKPHOS 92 10/26/2014 1002   ALKPHOS 80 05/31/2014 1058   ALKPHOS 84 03/26/2012 1022   AST 17 10/26/2014 1002   AST 18 05/31/2014 1058   AST 26 03/26/2012 1022   ALT 12 10/26/2014 1002   ALT 13 05/31/2014 1058   ALT 20 03/26/2012 1022   BILITOT 0.52 10/26/2014 1002   BILITOT 0.4 05/31/2014 1058   BILITOT 0.70 03/26/2012 1022        RADIOGRAPHIC STUDIES: No results found.  ASSESSMENT AND PLAN: this is a  very pleasant 74 years old white female with history of metastatic non-small cell lung cancer, adenocarcinoma currently on treatment with Tarceva status post 45 months of treatment. The patient is feeling fine with no specific complaints and tolerating her treatment well. The recent CT scan of the chest showed no evidence for disease recurrence or metastatic disease in the chest. The patient was discussed with and also seen by Dr. Julien Nordmann. She will continue with her current treatment with Tarceva at 150 mg by mouth daily. For the history of the breast cancer, the patient will continue on Femara 2.5 mg by mouth daily. She has had stable disease and we will have her return in 2 months for another symptom management visit with a restaging CT scan of her chest with contrast to reevaluate her disease.   The patient voices understanding of current disease status and treatment options and is in agreement with the current care plan.  All questions were answered. The patient knows to call the clinic with any problems,  questions or concerns. We can certainly see the patient much sooner if necessary.  Carlton Adam, PA-C 10/26/2014  ADDENDUM: Hematology/Oncology Attending: I had a face to face encounter with the patient. I recommended her care plan. This is a very pleasant 74 years old white female with metastatic non-small cell lung cancer, adenocarcinoma with positive EGFR mutation currently on treatment with Tarceva 150 mg by mouth daily and tolerating her treatment fairly well with no significant adverse effects. I recommend for the patient to continue her treatment as scheduled. For the history of breast cancer she will continue treatment was Femara 2.5 mg by mouth daily I recommended for the patient to come back for follow-up visit in 2 months for reevaluation after repeating CT scan of the chest for restaging of her disease. She was advised to call immediately if she has any concerning symptoms in the interval.  Disclaimer: This note was dictated with voice recognition software. Similar sounding words can inadvertently be transcribed and may not be corrected upon review. Eilleen Kempf., MD 10/26/2014

## 2014-10-26 NOTE — Patient Instructions (Signed)
Continue taking Tarceva 150 mg by mouth daily as well as your Femara 2.5 mg by mouth daily Follow-up in 2 months with a restaging CT scan of your chest to reevaluate your disease

## 2014-11-02 ENCOUNTER — Ambulatory Visit (INDEPENDENT_AMBULATORY_CARE_PROVIDER_SITE_OTHER): Payer: Medicare Other | Admitting: Ophthalmology

## 2014-11-02 DIAGNOSIS — H35033 Hypertensive retinopathy, bilateral: Secondary | ICD-10-CM

## 2014-11-02 DIAGNOSIS — I10 Essential (primary) hypertension: Secondary | ICD-10-CM

## 2014-11-02 DIAGNOSIS — H3531 Nonexudative age-related macular degeneration: Secondary | ICD-10-CM

## 2014-11-02 DIAGNOSIS — H43813 Vitreous degeneration, bilateral: Secondary | ICD-10-CM

## 2014-11-02 DIAGNOSIS — H35373 Puckering of macula, bilateral: Secondary | ICD-10-CM

## 2014-11-02 DIAGNOSIS — D3132 Benign neoplasm of left choroid: Secondary | ICD-10-CM

## 2014-12-12 ENCOUNTER — Telehealth: Payer: Self-pay | Admitting: *Deleted

## 2014-12-12 NOTE — Telephone Encounter (Signed)
THE PAIN PT. HAD Tuesday WAS INTERMITTENT CRAMPS AND ACHY. A LITTLE NAUSEA THIS MORNING BUT NO VOMITING. PT. HAD A GOOD BOWEL MOVEMENT ON 12/11/14. SHE IS PASSING GAS AND ABDOMEN IS SOFT. PT. ASKED IF SHE SHOULD CALL DR.MOHAMED OR HER PRIMARY CARE PHYSICIAN, DR.EASON. INSTRUCTED PT. TO CALL DR.EASON. SHE VOICES UNDERSTANDING.

## 2014-12-14 ENCOUNTER — Telehealth: Payer: Self-pay | Admitting: Internal Medicine

## 2014-12-14 NOTE — Telephone Encounter (Signed)
returned call and s.w pt and confirm appts.Marland KitchenMarland KitchenMarland KitchenMarland Kitchenpt ok and aware

## 2014-12-15 ENCOUNTER — Telehealth: Payer: Self-pay

## 2014-12-15 NOTE — Telephone Encounter (Signed)
Communication from [facility]. They will ship tarceva for delivery on 12/16/24

## 2014-12-19 ENCOUNTER — Other Ambulatory Visit: Payer: Self-pay | Admitting: Medical Oncology

## 2014-12-19 DIAGNOSIS — C3412 Malignant neoplasm of upper lobe, left bronchus or lung: Secondary | ICD-10-CM

## 2014-12-19 MED ORDER — ERLOTINIB HCL 150 MG PO TABS: 150.0000 mg | ORAL_TABLET | Freq: Every day | ORAL | Status: DC

## 2014-12-23 ENCOUNTER — Encounter (HOSPITAL_COMMUNITY): Payer: Self-pay

## 2014-12-23 ENCOUNTER — Other Ambulatory Visit (HOSPITAL_BASED_OUTPATIENT_CLINIC_OR_DEPARTMENT_OTHER): Payer: Medicare Other

## 2014-12-23 ENCOUNTER — Ambulatory Visit (HOSPITAL_COMMUNITY)
Admission: RE | Admit: 2014-12-23 | Discharge: 2014-12-23 | Disposition: A | Discharge status: Home / Self Care | Payer: Medicare Other | Source: Ambulatory Visit | Attending: Physician Assistant | Admitting: Physician Assistant

## 2014-12-23 DIAGNOSIS — C50911 Malignant neoplasm of unspecified site of right female breast: Secondary | ICD-10-CM | POA: Diagnosis not present

## 2014-12-23 DIAGNOSIS — C3412 Malignant neoplasm of upper lobe, left bronchus or lung: Secondary | ICD-10-CM

## 2014-12-23 DIAGNOSIS — C3490 Malignant neoplasm of unspecified part of unspecified bronchus or lung: Secondary | ICD-10-CM | POA: Diagnosis present

## 2014-12-23 DIAGNOSIS — C50912 Malignant neoplasm of unspecified site of left female breast: Secondary | ICD-10-CM | POA: Insufficient documentation

## 2014-12-23 DIAGNOSIS — C50919 Malignant neoplasm of unspecified site of unspecified female breast: Secondary | ICD-10-CM | POA: Diagnosis not present

## 2014-12-23 DIAGNOSIS — C779 Secondary and unspecified malignant neoplasm of lymph node, unspecified: Secondary | ICD-10-CM | POA: Diagnosis not present

## 2014-12-23 DIAGNOSIS — Z9013 Acquired absence of bilateral breasts and nipples: Secondary | ICD-10-CM | POA: Diagnosis not present

## 2014-12-23 LAB — COMPREHENSIVE METABOLIC PANEL (CC13)
ALBUMIN: 3.2 g/dL — AB (ref 3.5–5.0)
ALT: 14 U/L (ref 0–55)
AST: 21 U/L (ref 5–34)
Alkaline Phosphatase: 82 U/L (ref 40–150)
Anion Gap: 7 mEq/L (ref 3–11)
BUN: 12.1 mg/dL (ref 7.0–26.0)
CO2: 27 mEq/L (ref 22–29)
Calcium: 9.8 mg/dL (ref 8.4–10.4)
Chloride: 106 mEq/L (ref 98–109)
Creatinine: 1 mg/dL (ref 0.6–1.1)
EGFR: 58 mL/min/{1.73_m2} — AB (ref >90)
GLUCOSE: 103 mg/dL (ref 70–140)
POTASSIUM: 4.4 meq/L (ref 3.5–5.1)
SODIUM: 141 meq/L (ref 136–145)
Total Bilirubin: 0.55 mg/dL (ref 0.20–1.20)
Total Protein: 6.1 g/dL — ABNORMAL LOW (ref 6.4–8.3)

## 2014-12-23 LAB — CBC WITH DIFFERENTIAL/PLATELET
BASO%: 0.9 % (ref 0.0–2.0)
Basophils Absolute: 0.1 10*3/uL (ref 0.0–0.1)
EOS ABS: 0.3 10*3/uL (ref 0.0–0.5)
EOS%: 4.2 % (ref 0.0–7.0)
HCT: 34 % — ABNORMAL LOW (ref 34.8–46.6)
HGB: 10.8 g/dL — ABNORMAL LOW (ref 11.6–15.9)
LYMPH%: 14.3 % (ref 14.0–49.7)
MCH: 27.6 pg (ref 25.1–34.0)
MCHC: 31.7 g/dL (ref 31.5–36.0)
MCV: 86.9 fL (ref 79.5–101.0)
MONO#: 0.6 10*3/uL (ref 0.1–0.9)
MONO%: 9.5 % (ref 0.0–14.0)
NEUT#: 4.7 10*3/uL (ref 1.5–6.5)
NEUT%: 71.1 % (ref 38.4–76.8)
PLATELETS: 546 10*3/uL — AB (ref 145–400)
RBC: 3.91 10*6/uL (ref 3.70–5.45)
RDW: 14.8 % — ABNORMAL HIGH (ref 11.2–14.5)
WBC: 6.6 10*3/uL (ref 3.9–10.3)
lymph#: 1 10*3/uL (ref 0.9–3.3)

## 2014-12-23 MED ORDER — IOHEXOL 300 MG/ML  SOLN
80.0000 mL | Freq: Once | INTRAMUSCULAR | Status: AC | PRN
  Administered 2014-12-23: 80 mL via INTRAVENOUS

## 2014-12-26 ENCOUNTER — Encounter: Payer: Self-pay | Admitting: Internal Medicine

## 2014-12-26 ENCOUNTER — Telehealth: Payer: Self-pay | Admitting: Internal Medicine

## 2014-12-26 ENCOUNTER — Ambulatory Visit (HOSPITAL_BASED_OUTPATIENT_CLINIC_OR_DEPARTMENT_OTHER): Payer: Medicare Other | Admitting: Internal Medicine

## 2014-12-26 VITALS — BP 160/55 | HR 56 | Temp 98.1°F | Resp 18 | Ht 68.0 in | Wt 135.7 lb

## 2014-12-26 DIAGNOSIS — C779 Secondary and unspecified malignant neoplasm of lymph node, unspecified: Secondary | ICD-10-CM | POA: Diagnosis not present

## 2014-12-26 DIAGNOSIS — C3412 Malignant neoplasm of upper lobe, left bronchus or lung: Secondary | ICD-10-CM | POA: Diagnosis not present

## 2014-12-26 DIAGNOSIS — R21 Rash and other nonspecific skin eruption: Secondary | ICD-10-CM | POA: Diagnosis not present

## 2014-12-26 DIAGNOSIS — C50919 Malignant neoplasm of unspecified site of unspecified female breast: Secondary | ICD-10-CM

## 2014-12-26 NOTE — Telephone Encounter (Signed)
Pt confirmed labs/ov per 03/07 POF, gave pt AVS... KJ

## 2014-12-26 NOTE — Progress Notes (Signed)
[place] Telephone:([phone]   [phone]  OFFICE PROGRESS NOTE  Eber Hong, MD [address] 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  2) history of breast cancer.   Prior Therapy: None.   Current therapy:  1) Tarceva at 150 mg by mouth daily status post 48 months therapy.  2) Femara 2.5 mg by mouth daily.   CHEMOTHERAPY INTENT: palliative  CURRENT # OF CHEMOTHERAPY CYCLES: 49 CURRENT ANTIEMETICS: Compazine when necessary  CURRENT SMOKING STATUS: Never smoker  ORAL CHEMOTHERAPY AND CONSENT: Tarceva and Femara.  CURRENT BISPHOSPHONATES USE: None  PAIN MANAGEMENT: None  NARCOTICS INDUCED CONSTIPATION: None  LIVING WILL AND CODE STATUS: Full code initially but no prolonged resuscitation.   INTERVAL HISTORY: Kristin [name] 74 y.o. female returns to the clinic today for follow up visit accompanied by her daughter Kristin [name]. The patient is feeling fine today with no specific complaints except mild skin rash with no significant diarrhea. She had one episode of abdominal pain 2 weeks ago with no significant nausea or vomiting or diarrhea. This resolved spontaneously. The patient denied having any chest pain, shortness of breath, cough or hemoptysis. She is tolerating her treatment with Tarceva and Femara fairly well except for the skin rash. She denied having any significant nausea or vomiting, no fever or chills. She has no weight loss or night sweats. She had repeat CT scan of the chest performed recently and she is here for evaluation and discussion of her scan results.  MEDICAL HISTORY: Past Medical History  Diagnosis Date  . Hypertension   . Anemia   . Stroke   . Breast cancer 2002    bilateral  . Lung cancer dx'd 12/2010    ALLERGIES:  is allergic to sulfa antibiotics.  MEDICATIONS:  Current  Outpatient Prescriptions  Medication Sig Dispense Refill  . amLODipine (NORVASC) 5 MG tablet Take 5 mg by mouth daily.      . brimonidine-timolol (COMBIGAN) 0.2-0.5 % ophthalmic solution Place 1 drop into the right eye every 12 (twelve) hours.    . Calcium Carb-Cholecalciferol (RA CALCIUM 600/VITAMIN D-3) 600-400 MG-UNIT TABS Take 2 tablets by mouth daily.      . clindamycin (CLEOCIN T) 1 % lotion APPLY TWICE DAILY AS DIRECTED 60 mL 1  . clopidogrel (PLAVIX) 75 MG tablet Take 1 tablet (75 mg total) by mouth daily with breakfast. 90 tablet 3  . cycloSPORINE (RESTASIS) 0.05 % ophthalmic emulsion Place 1 drop into both eyes 2 (two) times daily.    Marland Kitchen erlotinib (TARCEVA) 150 MG tablet Take 1 tablet (150 mg total) by mouth daily. Take on an empty stomach 1 hour before meals or 2 hours after. 30 tablet 1  . escitalopram (LEXAPRO) 10 MG tablet Take 10 mg by mouth daily. Takes 32m and 144mtablet daily = 1557maily    . famotidine (PEPCID) 40 MG tablet Take 40 mg by mouth daily.      . FMarland KitchenFum-FePoly-FA-B Cmp-C-Biot (FOLIVANE-PLUS) CAPS TAKE 1 CAPSULE BY MOUTH DAILY 90 capsule 1  . ibuprofen (ADVIL,MOTRIN) 200 MG tablet Take 200 mg by mouth as needed.    . lMarland Kitchentrozole (FEMARA) 2.5 MG tablet Take 1 tablet (2.5 mg total) by mouth daily. 30 tablet 2  . lisinopril (PRINIVIL,ZESTRIL) 10 MG tablet 20 mg.     . zolpidem (AMBIEN) 5 MG tablet at bedtime as needed.     .Marland Kitchen  prochlorperazine (COMPAZINE) 10 MG tablet Take 1 tablet (10 mg total) by mouth every 6 (six) hours as needed for nausea or vomiting. (Patient not taking: Reported on 10/26/2014) 30 tablet 0  . [DISCONTINUED] erlotinib (TARCEVA) 150 MG tablet Take 1 tablet (150 mg total) by mouth daily. 30 tablet 2   No current facility-administered medications for this visit.    SURGICAL HISTORY:  Past Surgical History  Procedure Laterality Date  . Appendectomy    . Mastectomy      bilateral  . Lung surgery    . Cataracts      bilateral  . Colonoscopy    .  Tonsillectomy    . Tee without cardioversion N/A 06/04/2013    Procedure: TRANSESOPHAGEAL ECHOCARDIOGRAM (TEE);  Surgeon: Jolaine Artist, MD;  Location: [facility] ENDOSCOPY;  Service: Cardiovascular;  Laterality: N/A;    REVIEW OF SYSTEMS:  Constitutional: negative Eyes: negative Ears, nose, mouth, throat, and face: negative Respiratory: negative Cardiovascular: negative Gastrointestinal: negative Genitourinary:negative Integument/breast: positive for rash Hematologic/lymphatic: negative Musculoskeletal:positive for bone pain Neurological: negative Behavioral/Psych: negative Endocrine: negative Allergic/Immunologic: negative   PHYSICAL EXAMINATION: General appearance: alert, cooperative and no distress Head: Normocephalic, without obvious abnormality, atraumatic Neck: no adenopathy, no JVD and thyroid not enlarged, symmetric, no tenderness/mass/nodules Lymph nodes: Cervical, supraclavicular, and axillary nodes normal. Resp: clear to auscultation bilaterally and normal percussion bilaterally Back: symmetric, no curvature. ROM normal. No CVA tenderness. Cardio: regular rate and rhythm, S1, S2 normal, no murmur, click, rub or gallop GI: soft, non-tender; bowel sounds normal; no masses,  no organomegaly Extremities: extremities normal, atraumatic, no cyanosis or edema and Grade 2 skin rash on the lower extremities Neurologic: Alert and oriented X 3, normal strength and tone. Normal symmetric reflexes. Normal coordination and gait  ECOG PERFORMANCE STATUS: 1 - Symptomatic but completely ambulatory  Blood pressure 160/55, pulse 56, temperature 98.1 F (36.7 C), temperature source Oral, resp. rate 18, height 5' 8" (1.727 m), weight 135 lb 11.2 oz (61.553 kg).  LABORATORY DATA: Lab Results  Component Value Date   WBC 6.6 12/23/2014   HGB 10.8* 12/23/2014   HCT 34.0* 12/23/2014   MCV 86.9 12/23/2014   PLT 546* 12/23/2014      Chemistry      Component Value Date/Time   NA 141  12/23/2014 0926   NA 136 05/31/2014 1058   NA 147* 03/26/2012 1022   K 4.4 12/23/2014 0926   K 4.1 05/31/2014 1058   K 4.8* 03/26/2012 1022   CL 104 05/31/2014 1058   CL 107 04/05/2013 0754   CL 102 03/26/2012 1022   CO2 27 12/23/2014 0926   CO2 26 05/31/2014 1058   CO2 31 03/26/2012 1022   BUN 12.1 12/23/2014 0926   BUN 17 05/31/2014 1058   BUN 14 03/26/2012 1022   CREATININE 1.0 12/23/2014 0926   CREATININE 0.90 05/31/2014 1058   CREATININE 0.7 03/26/2012 1022      Component Value Date/Time   CALCIUM 9.8 12/23/2014 0926   CALCIUM 9.2 05/31/2014 1058   CALCIUM 9.1 03/26/2012 1022   ALKPHOS 82 12/23/2014 0926   ALKPHOS 80 05/31/2014 1058   ALKPHOS 84 03/26/2012 1022   AST 21 12/23/2014 0926   AST 18 05/31/2014 1058   AST 26 03/26/2012 1022   ALT 14 12/23/2014 0926   ALT 13 05/31/2014 1058   ALT 20 03/26/2012 1022   BILITOT 0.55 12/23/2014 0926   BILITOT 0.4 05/31/2014 1058   BILITOT 0.70 03/26/2012 1022  RADIOGRAPHIC STUDIES: Ct Chest W Contrast  12/23/2014   CLINICAL DATA:  Restaging non-small cell lung cancer, diagnosed 12/2010, Tarceva in progress. Bilateral breast cancer diagnosed 2002, femara in progress, status post bilateral mastectomy with right lymphadenectomy.  EXAM: CT CHEST WITH CONTRAST  TECHNIQUE: Multidetector CT imaging of the chest was performed during intravenous contrast administration.  CONTRAST:  70m OMNIPAQUE IOHEXOL 300 MG/ML  SOLN  COMPARISON:  09/21/2014  FINDINGS: Mediastinum/Nodes: The heart is normal in size. No pericardial effusion.  Mild coronary atherosclerosis in the LAD.  Atherosclerotic calcifications of the aortic arch.  No suspicious mediastinal, hilar, or axillary lymphadenopathy. Status post right axillary lymph node dissection.  Stable bilateral thyroid nodules/cysts.  Lungs/Pleura: Postsurgical changes related to left upper lobe wedge resection.  Lungs are otherwise essentially clear. No suspicious pulmonary nodules.  Mild  subpleural reticulation in the left lower lobe.  No pleural effusion or pneumothorax.  Upper abdomen: Visualized upper abdomen is notable for a stable 2.6 cm right hepatic lobe cysts and a moderate hiatal hernia/ intrathoracic stomach.  Musculoskeletal: Status post bilateral mastectomy with reconstruction.  Degenerative changes of the visualized thoracolumbar spine.  IMPRESSION: Status post left upper lobe wedge resection.  Status post bilateral mastectomy with right axillary lymph node dissection.  No evidence of recurrent or metastatic disease in the chest.   Electronically Signed   By: SJulian HyM.D.   On: 12/23/2014 11:26    ASSESSMENT AND PLAN: this is a very pleasant 74years old white female with history of metastatic non-small cell lung cancer, adenocarcinoma currently on treatment with Tarceva status post 48 months of treatment. The patient is feeling fine with no specific complaints and tolerating her treatment well. The recent CT scan of the chest showed no evidence for disease recurrence or metastatic disease in the chest. I recommended for the patient to continue her current treatment with Tarceva with the same dose.  For the history of the breast cancer, the patient will continue on Femara 2.5 mg by mouth daily. She would come back for followup visit in 2 months for reevaluation and management of any adverse effect of her treatment. The patient voices understanding of current disease status and treatment options and is in agreement with the current care plan.  All questions were answered. The patient knows to call the clinic with any problems, questions or concerns. We can certainly see the patient much sooner if necessary.  Disclaimer: This note was dictated with voice recognition software. Similar sounding words can inadvertently be transcribed and may not be corrected upon review.

## 2014-12-27 ENCOUNTER — Other Ambulatory Visit: Payer: Self-pay | Admitting: *Deleted

## 2014-12-27 DIAGNOSIS — Z853 Personal history of malignant neoplasm of breast: Secondary | ICD-10-CM

## 2014-12-27 MED ORDER — LETROZOLE 2.5 MG PO TABS: 2.5000 mg | ORAL_TABLET | Freq: Every day | ORAL | Status: DC

## 2014-12-30 ENCOUNTER — Other Ambulatory Visit: Payer: Self-pay | Admitting: *Deleted

## 2014-12-30 DIAGNOSIS — Z853 Personal history of malignant neoplasm of breast: Secondary | ICD-10-CM

## 2014-12-30 MED ORDER — LETROZOLE 2.5 MG PO TABS: 2.5000 mg | ORAL_TABLET | Freq: Every day | ORAL | Status: DC

## 2014-12-30 NOTE — Telephone Encounter (Signed)
Called [facility], Letrozole was escribed to them instead of [facility]. Cancelled [facility] rx for letrozole. Escribed to [employer] today.

## 2015-02-06 ENCOUNTER — Encounter: Payer: Self-pay | Admitting: Internal Medicine

## 2015-02-06 NOTE — Progress Notes (Signed)
Per [facility] fax tarceva was approved and will be shipped to patient 02/08/15

## 2015-02-27 ENCOUNTER — Ambulatory Visit (HOSPITAL_BASED_OUTPATIENT_CLINIC_OR_DEPARTMENT_OTHER): Payer: Medicare Other | Admitting: Internal Medicine

## 2015-02-27 ENCOUNTER — Telehealth: Payer: Self-pay | Admitting: Internal Medicine

## 2015-02-27 ENCOUNTER — Encounter: Payer: Self-pay | Admitting: Internal Medicine

## 2015-02-27 ENCOUNTER — Other Ambulatory Visit (HOSPITAL_BASED_OUTPATIENT_CLINIC_OR_DEPARTMENT_OTHER): Payer: Medicare Other

## 2015-02-27 VITALS — BP 140/48 | HR 58 | Temp 97.6°F | Resp 18 | Ht 68.0 in | Wt 135.7 lb

## 2015-02-27 DIAGNOSIS — C3412 Malignant neoplasm of upper lobe, left bronchus or lung: Secondary | ICD-10-CM

## 2015-02-27 DIAGNOSIS — C50919 Malignant neoplasm of unspecified site of unspecified female breast: Secondary | ICD-10-CM

## 2015-02-27 DIAGNOSIS — C779 Secondary and unspecified malignant neoplasm of lymph node, unspecified: Secondary | ICD-10-CM | POA: Diagnosis not present

## 2015-02-27 DIAGNOSIS — R21 Rash and other nonspecific skin eruption: Secondary | ICD-10-CM | POA: Diagnosis not present

## 2015-02-27 LAB — COMPREHENSIVE METABOLIC PANEL (CC13)
ALT: 15 U/L (ref 0–55)
ANION GAP: 9 meq/L (ref 3–11)
AST: 17 U/L (ref 5–34)
Albumin: 3.2 g/dL — ABNORMAL LOW (ref 3.5–5.0)
Alkaline Phosphatase: 82 U/L (ref 40–150)
BILIRUBIN TOTAL: 0.62 mg/dL (ref 0.20–1.20)
BUN: 14.6 mg/dL (ref 7.0–26.0)
CO2: 26 mEq/L (ref 22–29)
CREATININE: 0.8 mg/dL (ref 0.6–1.1)
Calcium: 9.6 mg/dL (ref 8.4–10.4)
Chloride: 106 mEq/L (ref 98–109)
EGFR: 70 mL/min/{1.73_m2} — AB (ref >90)
Glucose: 108 mg/dl (ref 70–140)
Potassium: 4.5 mEq/L (ref 3.5–5.1)
Sodium: 141 mEq/L (ref 136–145)
Total Protein: 6.3 g/dL — ABNORMAL LOW (ref 6.4–8.3)

## 2015-02-27 LAB — CBC WITH DIFFERENTIAL/PLATELET
BASO%: 0.7 % (ref 0.0–2.0)
Basophils Absolute: 0 10*3/uL (ref 0.0–0.1)
EOS%: 6.2 % (ref 0.0–7.0)
Eosinophils Absolute: 0.3 10*3/uL (ref 0.0–0.5)
HEMATOCRIT: 33.2 % — AB (ref 34.8–46.6)
HGB: 10.4 g/dL — ABNORMAL LOW (ref 11.6–15.9)
LYMPH#: 0.8 10*3/uL — AB (ref 0.9–3.3)
LYMPH%: 19.4 % (ref 14.0–49.7)
MCH: 28 pg (ref 25.1–34.0)
MCHC: 31.3 g/dL — ABNORMAL LOW (ref 31.5–36.0)
MCV: 89.5 fL (ref 79.5–101.0)
MONO#: 0.4 10*3/uL (ref 0.1–0.9)
MONO%: 9 % (ref 0.0–14.0)
NEUT#: 2.8 10*3/uL (ref 1.5–6.5)
NEUT%: 64.7 % (ref 38.4–76.8)
Platelets: 433 10*3/uL — ABNORMAL HIGH (ref 145–400)
RBC: 3.71 10*6/uL (ref 3.70–5.45)
RDW: 15.8 % — AB (ref 11.2–14.5)
WBC: 4.3 10*3/uL (ref 3.9–10.3)

## 2015-02-27 NOTE — Telephone Encounter (Signed)
Pt confirmed labs/ov per 05/09 POF, gave pt AVS and Calendar..... KJ °

## 2015-02-27 NOTE — Progress Notes (Signed)
[place] [phone]   [phone]  OFFICE PROGRESS NOTE  Eber Hong, MD [address] [place] [address]  Principle Diagnosis:  1) Metastatic non-small cell lung cancer adenocarcinoma with positive for EGFR mutation at exon 21 diagnosed in March of 2012.  2) history of breast cancer.   Prior Therapy: None.   Current therapy:  1) Tarceva at 150 mg by mouth daily status post 51 months therapy.  2) Femara 2.5 mg by mouth daily.   CHEMOTHERAPY INTENT: palliative  CURRENT # OF CHEMOTHERAPY CYCLES: 52 CURRENT ANTIEMETICS: Compazine when necessary  CURRENT SMOKING STATUS: Never smoker  ORAL CHEMOTHERAPY AND CONSENT: Tarceva and Femara.  CURRENT BISPHOSPHONATES USE: None  PAIN MANAGEMENT: None  NARCOTICS INDUCED CONSTIPATION: None  LIVING WILL AND CODE STATUS: Full code initially but no prolonged resuscitation.   INTERVAL HISTORY: Kristin [name] 74 y.o. female returns to the clinic today for follow up visit. The patient is feeling fine today with no specific complaints except mild skin rash with no significant diarrhea. The patient denied having any chest pain, shortness of breath, cough or hemoptysis. She is tolerating her treatment with Tarceva and Femara fairly well except for the skin rash. She denied having any significant nausea or vomiting, no fever or chills. She has no weight loss or night sweats. She received a letter recently from the [facility] persistent programs that he would not be able to help with her copayment anymore because they are running out of fond.   MEDICAL HISTORY: Past Medical History  Diagnosis Date  . Hypertension   . Anemia   . Stroke   . Breast cancer 2002    bilateral  . Lung cancer dx'd 12/2010    ALLERGIES:  is allergic to sulfa antibiotics.  MEDICATIONS:  Current Outpatient Prescriptions  Medication Sig Dispense Refill  . amLODipine (NORVASC) 5 MG tablet Take 5 mg by mouth daily.      .  brimonidine-timolol (COMBIGAN) 0.2-0.5 % ophthalmic solution Place 1 drop into the right eye every 12 (twelve) hours.    . Calcium Carb-Cholecalciferol (RA CALCIUM 600/VITAMIN D-3) 600-400 MG-UNIT TABS Take 2 tablets by mouth daily.      . cefUROXime (CEFTIN) 250 MG tablet     . clindamycin (CLEOCIN T) 1 % lotion APPLY TWICE DAILY AS DIRECTED 60 mL 1  . clopidogrel (PLAVIX) 75 MG tablet Take 1 tablet (75 mg total) by mouth daily with breakfast. 90 tablet 3  . cycloSPORINE (RESTASIS) 0.05 % ophthalmic emulsion Place 1 drop into both eyes 2 (two) times daily.    Marland Kitchen erlotinib (TARCEVA) 150 MG tablet Take 1 tablet (150 mg total) by mouth daily. Take on an empty stomach 1 hour before meals or 2 hours after. 30 tablet 1  . escitalopram (LEXAPRO) 10 MG tablet Take 10 mg by mouth daily. Takes 54m and 116mtablet daily = 1576maily    . famotidine (PEPCID) 40 MG tablet Take 40 mg by mouth daily.      . FMarland KitchenFum-FePoly-FA-B Cmp-C-Biot (FOLIVANE-PLUS) CAPS TAKE 1 CAPSULE BY MOUTH DAILY 90 capsule 1  . ibuprofen (ADVIL,MOTRIN) 200 MG tablet Take 200 mg by mouth as needed.    . lMarland Kitchentrozole (FEMARA) 2.5 MG tablet Take 1 tablet (2.5 mg total) by mouth daily. 30 tablet 2  . lisinopril (PRINIVIL,ZESTRIL) 20 MG tablet     . prochlorperazine (COMPAZINE) 10 MG tablet Take 1 tablet (10 mg total) by mouth every 6 (six) hours as needed for  nausea or vomiting. 30 tablet 0  . zolpidem (AMBIEN) 5 MG tablet at bedtime as needed.     . [DISCONTINUED] erlotinib (TARCEVA) 150 MG tablet Take 1 tablet (150 mg total) by mouth daily. 30 tablet 2   No current facility-administered medications for this visit.    SURGICAL HISTORY:  Past Surgical History  Procedure Laterality Date  . Appendectomy    . Mastectomy      bilateral  . Lung surgery    . Cataracts      bilateral  . Colonoscopy    . Tonsillectomy    . Tee without cardioversion N/A 06/04/2013    Procedure: TRANSESOPHAGEAL ECHOCARDIOGRAM (TEE);  Surgeon: Jolaine Artist, MD;  Location: [facility] ENDOSCOPY;  Service: Cardiovascular;  Laterality: N/A;    REVIEW OF SYSTEMS:  Constitutional: negative Eyes: negative Ears, nose, mouth, throat, and face: negative Respiratory: negative Cardiovascular: negative Gastrointestinal: negative Genitourinary:negative Integument/breast: positive for rash Hematologic/lymphatic: negative Musculoskeletal:positive for bone pain Neurological: negative Behavioral/Psych: negative Endocrine: negative Allergic/Immunologic: negative   PHYSICAL EXAMINATION: General appearance: alert, cooperative and no distress Head: Normocephalic, without obvious abnormality, atraumatic Neck: no adenopathy, no JVD and thyroid not enlarged, symmetric, no tenderness/mass/nodules Lymph nodes: Cervical, supraclavicular, and axillary nodes normal. Resp: clear to auscultation bilaterally and normal percussion bilaterally Back: symmetric, no curvature. ROM normal. No CVA tenderness. Cardio: regular rate and rhythm, S1, S2 normal, no murmur, click, rub or gallop GI: soft, non-tender; bowel sounds normal; no masses,  no organomegaly Extremities: extremities normal, atraumatic, no cyanosis or edema and Grade 2 skin rash on the lower extremities Neurologic: Alert and oriented X 3, normal strength and tone. Normal symmetric reflexes. Normal coordination and gait  ECOG PERFORMANCE STATUS: 1 - Symptomatic but completely ambulatory  Blood pressure 140/48, pulse 58, temperature 97.6 F (36.4 C), temperature source Oral, resp. rate 18, height 5' 8"  (1.727 m), weight 135 lb 11.2 oz (61.553 kg), SpO2 100 %.  LABORATORY DATA: Lab Results  Component Value Date   WBC 4.3 02/27/2015   HGB 10.4* 02/27/2015   HCT 33.2* 02/27/2015   MCV 89.5 02/27/2015   PLT 433* 02/27/2015      Chemistry      Component Value Date/Time   NA 141 02/27/2015 1014   NA 136 05/31/2014 1058   NA 147* 03/26/2012 1022   K 4.5 02/27/2015 1014   K 4.1 05/31/2014 1058   K  4.8* 03/26/2012 1022   CL 104 05/31/2014 1058   CL 107 04/05/2013 0754   CL 102 03/26/2012 1022   CO2 26 02/27/2015 1014   CO2 26 05/31/2014 1058   CO2 31 03/26/2012 1022   BUN 14.6 02/27/2015 1014   BUN 17 05/31/2014 1058   BUN 14 03/26/2012 1022   CREATININE 0.8 02/27/2015 1014   CREATININE 0.90 05/31/2014 1058   CREATININE 0.7 03/26/2012 1022      Component Value Date/Time   CALCIUM 9.8 12/23/2014 0926   CALCIUM 9.2 05/31/2014 1058   CALCIUM 9.1 03/26/2012 1022   ALKPHOS 82 12/23/2014 0926   ALKPHOS 80 05/31/2014 1058   ALKPHOS 84 03/26/2012 1022   AST 21 12/23/2014 0926   AST 18 05/31/2014 1058   AST 26 03/26/2012 1022   ALT 14 12/23/2014 0926   ALT 13 05/31/2014 1058   ALT 20 03/26/2012 1022   BILITOT 0.55 12/23/2014 0926   BILITOT 0.4 05/31/2014 1058   BILITOT 0.70 03/26/2012 1022        RADIOGRAPHIC STUDIES: No results found.  ASSESSMENT  AND PLAN: this is a very pleasant 74 years old white female with history of metastatic non-small cell lung cancer, adenocarcinoma currently on treatment with Tarceva status post 51 months of treatment. The patient is feeling fine with no specific complaints and tolerating her treatment well. I recommended for the patient to continue her current treatment with Tarceva with the same dose.  For the history of the breast cancer, the patient will continue on Femara 2.5 mg by mouth daily. She would come back for followup visit in 6 weeks for reevaluation and management of any adverse effect of her treatment after repeating CT scan of the chest. For the copayment for her Tarceva, I referred the patient with the financial counselor at the [facility] for evaluation and help with this issue. The patient voices understanding of current disease status and treatment options and is in agreement with the current care plan.  All questions were answered. The patient knows to call the clinic with any problems, questions or concerns. We can  certainly see the patient much sooner if necessary.  Disclaimer: This note was dictated with voice recognition software. Similar sounding words can inadvertently be transcribed and may not be corrected upon review.

## 2015-02-27 NOTE — Progress Notes (Signed)
I called and gave the patient ph# for [facility] to see if any asst is available for tarceva.

## 2015-02-28 ENCOUNTER — Other Ambulatory Visit: Payer: Self-pay | Admitting: *Deleted

## 2015-02-28 DIAGNOSIS — C3412 Malignant neoplasm of upper lobe, left bronchus or lung: Secondary | ICD-10-CM

## 2015-02-28 MED ORDER — ERLOTINIB HCL 150 MG PO TABS: 150.0000 mg | ORAL_TABLET | Freq: Every day | ORAL | Status: DC

## 2015-02-28 NOTE — Telephone Encounter (Signed)
Reviewed refill request with MD,ok to refill Tarceva 150 mg 1 po daily,  Rx escribed to [facility]

## 2015-04-03 ENCOUNTER — Other Ambulatory Visit: Payer: Self-pay | Admitting: Medical Oncology

## 2015-04-03 DIAGNOSIS — Z853 Personal history of malignant neoplasm of breast: Secondary | ICD-10-CM

## 2015-04-03 MED ORDER — LETROZOLE 2.5 MG PO TABS: 2.5000 mg | ORAL_TABLET | Freq: Every day | ORAL | Status: DC

## 2015-04-18 ENCOUNTER — Encounter (HOSPITAL_COMMUNITY): Payer: Self-pay

## 2015-04-18 ENCOUNTER — Other Ambulatory Visit (HOSPITAL_BASED_OUTPATIENT_CLINIC_OR_DEPARTMENT_OTHER): Payer: Medicare Other

## 2015-04-18 ENCOUNTER — Ambulatory Visit (HOSPITAL_COMMUNITY)
Admission: RE | Admit: 2015-04-18 | Discharge: 2015-04-18 | Disposition: A | Discharge status: Home / Self Care | Payer: Medicare Other | Source: Ambulatory Visit | Attending: Internal Medicine | Admitting: Internal Medicine

## 2015-04-18 DIAGNOSIS — Z902 Acquired absence of lung [part of]: Secondary | ICD-10-CM | POA: Insufficient documentation

## 2015-04-18 DIAGNOSIS — K449 Diaphragmatic hernia without obstruction or gangrene: Secondary | ICD-10-CM | POA: Insufficient documentation

## 2015-04-18 DIAGNOSIS — C779 Secondary and unspecified malignant neoplasm of lymph node, unspecified: Secondary | ICD-10-CM

## 2015-04-18 DIAGNOSIS — C3412 Malignant neoplasm of upper lobe, left bronchus or lung: Secondary | ICD-10-CM | POA: Insufficient documentation

## 2015-04-18 DIAGNOSIS — Z08 Encounter for follow-up examination after completed treatment for malignant neoplasm: Secondary | ICD-10-CM | POA: Insufficient documentation

## 2015-04-18 DIAGNOSIS — Z853 Personal history of malignant neoplasm of breast: Secondary | ICD-10-CM | POA: Insufficient documentation

## 2015-04-18 DIAGNOSIS — I700 Atherosclerosis of aorta: Secondary | ICD-10-CM | POA: Diagnosis not present

## 2015-04-18 DIAGNOSIS — C50919 Malignant neoplasm of unspecified site of unspecified female breast: Secondary | ICD-10-CM

## 2015-04-18 LAB — CBC WITH DIFFERENTIAL/PLATELET
BASO%: 1.2 % (ref 0.0–2.0)
Basophils Absolute: 0.1 10*3/uL (ref 0.0–0.1)
EOS%: 3.3 % (ref 0.0–7.0)
Eosinophils Absolute: 0.2 10*3/uL (ref 0.0–0.5)
HEMATOCRIT: 34.1 % — AB (ref 34.8–46.6)
HGB: 11.1 g/dL — ABNORMAL LOW (ref 11.6–15.9)
LYMPH#: 0.9 10*3/uL (ref 0.9–3.3)
LYMPH%: 14.8 % (ref 14.0–49.7)
MCH: 29.1 pg (ref 25.1–34.0)
MCHC: 32.7 g/dL (ref 31.5–36.0)
MCV: 88.9 fL (ref 79.5–101.0)
MONO#: 0.7 10*3/uL (ref 0.1–0.9)
MONO%: 10.7 % (ref 0.0–14.0)
NEUT#: 4.5 10*3/uL (ref 1.5–6.5)
NEUT%: 70 % (ref 38.4–76.8)
Platelets: 429 10*3/uL — ABNORMAL HIGH (ref 145–400)
RBC: 3.83 10*6/uL (ref 3.70–5.45)
RDW: 15.3 % — ABNORMAL HIGH (ref 11.2–14.5)
WBC: 6.4 10*3/uL (ref 3.9–10.3)

## 2015-04-18 LAB — COMPREHENSIVE METABOLIC PANEL (CC13)
ALT: 16 U/L (ref 0–55)
ANION GAP: 3 meq/L (ref 3–11)
AST: 19 U/L (ref 5–34)
Albumin: 3.3 g/dL — ABNORMAL LOW (ref 3.5–5.0)
Alkaline Phosphatase: 84 U/L (ref 40–150)
BUN: 17.9 mg/dL (ref 7.0–26.0)
CHLORIDE: 106 meq/L (ref 98–109)
CO2: 30 meq/L — AB (ref 22–29)
Calcium: 9.6 mg/dL (ref 8.4–10.4)
Creatinine: 0.9 mg/dL (ref 0.6–1.1)
EGFR: 64 mL/min/{1.73_m2} — ABNORMAL LOW (ref >90)
Glucose: 102 mg/dl (ref 70–140)
POTASSIUM: 4.6 meq/L (ref 3.5–5.1)
SODIUM: 139 meq/L (ref 136–145)
TOTAL PROTEIN: 6.2 g/dL — AB (ref 6.4–8.3)
Total Bilirubin: 0.56 mg/dL (ref 0.20–1.20)

## 2015-04-18 MED ORDER — IOHEXOL 300 MG/ML  SOLN
100.0000 mL | Freq: Once | INTRAMUSCULAR | Status: AC | PRN
  Administered 2015-04-18: 80 mL via INTRAVENOUS

## 2015-04-25 ENCOUNTER — Ambulatory Visit (HOSPITAL_BASED_OUTPATIENT_CLINIC_OR_DEPARTMENT_OTHER): Payer: Medicare Other | Admitting: Internal Medicine

## 2015-04-25 ENCOUNTER — Telehealth: Payer: Self-pay | Admitting: Internal Medicine

## 2015-04-25 ENCOUNTER — Encounter: Payer: Self-pay | Admitting: Internal Medicine

## 2015-04-25 VITALS — BP 153/53 | HR 61 | Temp 98.1°F | Resp 20 | Ht 68.0 in | Wt 136.7 lb

## 2015-04-25 DIAGNOSIS — C779 Secondary and unspecified malignant neoplasm of lymph node, unspecified: Secondary | ICD-10-CM | POA: Diagnosis not present

## 2015-04-25 DIAGNOSIS — C3412 Malignant neoplasm of upper lobe, left bronchus or lung: Secondary | ICD-10-CM | POA: Diagnosis present

## 2015-04-25 DIAGNOSIS — M810 Age-related osteoporosis without current pathological fracture: Secondary | ICD-10-CM | POA: Diagnosis not present

## 2015-04-25 NOTE — Progress Notes (Signed)
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  2) history of breast cancer.   Prior Therapy: None.   Current therapy:  1) Tarceva at 150 mg by mouth daily status post 53 months therapy.  2) Femara 2.5 mg by mouth daily.   CHEMOTHERAPY INTENT: palliative  CURRENT # OF CHEMOTHERAPY CYCLES: 54 CURRENT ANTIEMETICS: Compazine when necessary  CURRENT SMOKING STATUS: Never smoker  ORAL CHEMOTHERAPY AND CONSENT: Tarceva and Femara.  CURRENT BISPHOSPHONATES USE: None  PAIN MANAGEMENT: None  NARCOTICS INDUCED CONSTIPATION: None  LIVING WILL AND CODE STATUS: Full code initially but no prolonged resuscitation.   INTERVAL HISTORY: Kristin [name] 74 y.o. female returns to the clinic today for follow up visit accompanied by her daughter. The patient is feeling fine today with no specific complaints except mild skin rash with no significant diarrhea. The patient denied having any chest pain, shortness of breath, cough or hemoptysis. She is tolerating her treatment with Tarceva and Femara fairly well except for the skin rash. She denied having any significant nausea or vomiting, no fever or chills. She has no weight loss or night sweats. She had a recent bone density assay that showed evidence for osteoporosis. The patient had repeat CT scan of the chest performed recently and she is here for evaluation and discussion of her scan results.  MEDICAL HISTORY: Past Medical History  Diagnosis Date  . Hypertension   . Anemia   . Stroke   . Breast cancer 2002    bilateral  . Lung cancer dx'd 12/2010    ALLERGIES:  is allergic to sulfa antibiotics.  MEDICATIONS:  Current Outpatient Prescriptions  Medication Sig Dispense Refill  .  amLODipine (NORVASC) 5 MG tablet Take 5 mg by mouth daily.      . brimonidine-timolol (COMBIGAN) 0.2-0.5 % ophthalmic solution Place 1 drop into the right eye every 12 (twelve) hours.    . Calcium Carb-Cholecalciferol (RA CALCIUM 600/VITAMIN D-3) 600-400 MG-UNIT TABS Take 2 tablets by mouth daily.      . cefUROXime (CEFTIN) 250 MG tablet     . clindamycin (CLEOCIN T) 1 % lotion APPLY TWICE DAILY AS DIRECTED 60 mL 1  . clopidogrel (PLAVIX) 75 MG tablet Take 1 tablet (75 mg total) by mouth daily with breakfast. 90 tablet 3  . cycloSPORINE (RESTASIS) 0.05 % ophthalmic emulsion Place 1 drop into both eyes 2 (two) times daily.    Marland Kitchen erlotinib (TARCEVA) 150 MG tablet Take 1 tablet (150 mg total) by mouth daily. Take on an empty stomach 1 hour before meals or 2 hours after. 30 tablet 1  . escitalopram (LEXAPRO) 10 MG tablet Take 10 mg by mouth daily. Takes 24m and 185mtablet daily = 1563maily    . famotidine (PEPCID) 40 MG tablet Take 40 mg by mouth daily.      . FMarland KitchenFum-FePoly-FA-B Cmp-C-Biot (FOLIVANE-PLUS) CAPS TAKE 1 CAPSULE BY MOUTH DAILY 90 capsule 1  . letrozole (FEMARA) 2.5 MG tablet Take 1 tablet (2.5 mg total) by mouth daily. 30 tablet 2  . lisinopril (PRINIVIL,ZESTRIL) 20 MG tablet     . zolpidem (AMBIEN) 5 MG tablet at bedtime as needed.     . iMarland Kitchenuprofen (ADVIL,MOTRIN) 200 MG tablet Take 200 mg by mouth as needed.    .Marland Kitchen  prochlorperazine (COMPAZINE) 10 MG tablet Take 1 tablet (10 mg total) by mouth every 6 (six) hours as needed for nausea or vomiting. (Patient not taking: Reported on 04/25/2015) 30 tablet 0  . [DISCONTINUED] erlotinib (TARCEVA) 150 MG tablet Take 1 tablet (150 mg total) by mouth daily. 30 tablet 2   No current facility-administered medications for this visit.    SURGICAL HISTORY:  Past Surgical History  Procedure Laterality Date  . Appendectomy    . Mastectomy      bilateral  . Lung surgery    . Cataracts      bilateral  . Colonoscopy    . Tonsillectomy    . Tee  without cardioversion N/A 06/04/2013    Procedure: TRANSESOPHAGEAL ECHOCARDIOGRAM (TEE);  Surgeon: Jolaine Artist, MD;  Location: [facility] ENDOSCOPY;  Service: Cardiovascular;  Laterality: N/A;    REVIEW OF SYSTEMS:  Constitutional: negative Eyes: negative Ears, nose, mouth, throat, and face: negative Respiratory: negative Cardiovascular: negative Gastrointestinal: negative Genitourinary:negative Integument/breast: positive for rash Hematologic/lymphatic: negative Musculoskeletal:positive for bone pain Neurological: negative Behavioral/Psych: negative Endocrine: negative Allergic/Immunologic: negative   PHYSICAL EXAMINATION: General appearance: alert, cooperative and no distress Head: Normocephalic, without obvious abnormality, atraumatic Neck: no adenopathy, no JVD and thyroid not enlarged, symmetric, no tenderness/mass/nodules Lymph nodes: Cervical, supraclavicular, and axillary nodes normal. Resp: clear to auscultation bilaterally and normal percussion bilaterally Back: symmetric, no curvature. ROM normal. No CVA tenderness. Cardio: regular rate and rhythm, S1, S2 normal, no murmur, click, rub or gallop GI: soft, non-tender; bowel sounds normal; no masses,  no organomegaly Extremities: extremities normal, atraumatic, no cyanosis or edema and Grade 2 skin rash on the lower extremities Neurologic: Alert and oriented X 3, normal strength and tone. Normal symmetric reflexes. Normal coordination and gait  ECOG PERFORMANCE STATUS: 1 - Symptomatic but completely ambulatory  Blood pressure 153/53, pulse 61, temperature 98.1 F (36.7 C), temperature source Oral, resp. rate 20, height 5' 8"  (1.727 m), weight 136 lb 11.2 oz (62.007 kg), SpO2 100 %.  LABORATORY DATA: Lab Results  Component Value Date   WBC 6.4 04/18/2015   HGB 11.1* 04/18/2015   HCT 34.1* 04/18/2015   MCV 88.9 04/18/2015   PLT 429* 04/18/2015      Chemistry      Component Value Date/Time   NA 139 04/18/2015 0906     NA 136 05/31/2014 1058   NA 147* 03/26/2012 1022   K 4.6 04/18/2015 0906   K 4.1 05/31/2014 1058   K 4.8* 03/26/2012 1022   CL 104 05/31/2014 1058   CL 107 04/05/2013 0754   CL 102 03/26/2012 1022   CO2 30* 04/18/2015 0906   CO2 26 05/31/2014 1058   CO2 31 03/26/2012 1022   BUN 17.9 04/18/2015 0906   BUN 17 05/31/2014 1058   BUN 14 03/26/2012 1022   CREATININE 0.9 04/18/2015 0906   CREATININE 0.90 05/31/2014 1058   CREATININE 0.7 03/26/2012 1022      Component Value Date/Time   CALCIUM 9.6 04/18/2015 0906   CALCIUM 9.2 05/31/2014 1058   CALCIUM 9.1 03/26/2012 1022   ALKPHOS 84 04/18/2015 0906   ALKPHOS 80 05/31/2014 1058   ALKPHOS 84 03/26/2012 1022   AST 19 04/18/2015 0906   AST 18 05/31/2014 1058   AST 26 03/26/2012 1022   ALT 16 04/18/2015 0906   ALT 13 05/31/2014 1058   ALT 20 03/26/2012 1022   BILITOT 0.56 04/18/2015 0906   BILITOT 0.4 05/31/2014 1058   BILITOT 0.70 03/26/2012 1022  RADIOGRAPHIC STUDIES: Ct Chest W Contrast  04/18/2015   CLINICAL DATA:  Restaging non-small cell lung cancer. Initial diagnosis March 2012. Remote history of bilateral breast cancer.  EXAM: CT CHEST WITH CONTRAST  TECHNIQUE: Multidetector CT imaging of the chest was performed during intravenous contrast administration.  CONTRAST:  26m OMNIPAQUE IOHEXOL 300 MG/ML  SOLN  COMPARISON:  12/23/2014  FINDINGS: Chest wall: Status post bilateral mastectomies. No chest wall mass, supraclavicular or axillary lymphadenopathy. The thyroid gland demonstrates numerous small stable lesions. The bony thorax is intact. No destructive bone lesions or spinal canal compromise.  Mediastinum: The heart is normal in size. No pericardial effusion. No enlarged mediastinal or hilar lymph nodes. The aorta demonstrates stable atherosclerotic calcifications but no aneurysm or dissection. Stable coronary artery calcifications. Stable large hiatal hernia.  Lungs/ pleura: Stable surgical changes from a left upper  lobe lung resection. No findings for residual or recurrent tumor. The lungs are clear. No pleural effusion. No pulmonary nodules.  Upper abdomen:  Stable hepatic cyst.  No adrenal gland lesions.  IMPRESSION: 1. Status post left upper lobe lung resection without findings for recurrent tumor or metastatic pulmonary disease. 2. No mediastinal or hilar mass or adenopathy. 3. Stable numerous small thyroid lesions. 4. Stable atherosclerotic calcifications involving the aorta and coronary arteries. 5. Stable large hiatal hernia.   Electronically Signed   By: PMarijo SanesM.D.   On: 04/18/2015 12:01    ASSESSMENT AND PLAN: this is a very pleasant 74years old white female with history of metastatic non-small cell lung cancer, adenocarcinoma currently on treatment with Tarceva status post 53 months of treatment. The patient is feeling fine with no specific complaints and tolerating her treatment well. The recent CT scan of the chest showed no evidence for disease progression. I recommended for the patient to continue her current treatment with Tarceva with the same dose.  For the history of the breast cancer, the patient will continue on Femara 2.5 mg by mouth daily. For the osteoporosis, I will start the patient on Prolia every 6 months first dose 06/27/2015 She would come back for follow-up visit in 2 months for reevaluation with repeat blood work. The patient voices understanding of current disease status and treatment options and is in agreement with the current care plan.  All questions were answered. The patient knows to call the clinic with any problems, questions or concerns. We can certainly see the patient much sooner if necessary.  Disclaimer: This note was dictated with voice recognition software. Similar sounding words can inadvertently be transcribed and may not be corrected upon review.

## 2015-04-25 NOTE — Telephone Encounter (Signed)
Pt confirmed labs/ov/inj per 07/05 POF, gave pt AVS and Calendar.... KJ

## 2015-05-22 ENCOUNTER — Other Ambulatory Visit: Payer: Self-pay | Admitting: Medical Oncology

## 2015-05-22 DIAGNOSIS — C3412 Malignant neoplasm of upper lobe, left bronchus or lung: Secondary | ICD-10-CM

## 2015-05-22 MED ORDER — ERLOTINIB HCL 150 MG PO TABS: 150.0000 mg | ORAL_TABLET | Freq: Every day | ORAL | Status: DC

## 2015-05-23 ENCOUNTER — Encounter: Payer: Self-pay | Admitting: Internal Medicine

## 2015-05-23 NOTE — Progress Notes (Signed)
Per [facility] tarceva has been processed and they are calling the patient to set up delivery

## 2015-05-24 ENCOUNTER — Encounter: Payer: Self-pay | Admitting: Oncology

## 2015-05-24 NOTE — Progress Notes (Signed)
Per [facility] tarceva '150mg'$  tablet will be shipped to patient 05/29/15

## 2015-06-05 ENCOUNTER — Telehealth: Payer: Self-pay | Admitting: Internal Medicine

## 2015-06-05 NOTE — Telephone Encounter (Signed)
Pt called confirming to correct MD on schedule, fix schedule and apologized to pt that I was the one that made the error, she was fine with it and confirmed labs/ov... KJ

## 2015-06-21 ENCOUNTER — Encounter: Payer: Self-pay | Admitting: Internal Medicine

## 2015-06-21 NOTE — Progress Notes (Signed)
Per [facility] tarceva will be shipped to patient 06/23/15

## 2015-06-27 ENCOUNTER — Ambulatory Visit: Payer: Medicare Other

## 2015-06-27 ENCOUNTER — Ambulatory Visit: Payer: Medicare Other | Admitting: Oncology

## 2015-06-27 ENCOUNTER — Other Ambulatory Visit: Payer: Medicare Other

## 2015-06-29 ENCOUNTER — Telehealth: Payer: Self-pay | Admitting: Internal Medicine

## 2015-06-29 ENCOUNTER — Other Ambulatory Visit (HOSPITAL_BASED_OUTPATIENT_CLINIC_OR_DEPARTMENT_OTHER): Payer: Medicare Other

## 2015-06-29 ENCOUNTER — Ambulatory Visit (HOSPITAL_BASED_OUTPATIENT_CLINIC_OR_DEPARTMENT_OTHER): Payer: Medicare Other | Admitting: Internal Medicine

## 2015-06-29 ENCOUNTER — Encounter: Payer: Self-pay | Admitting: Internal Medicine

## 2015-06-29 VITALS — BP 151/55 | HR 60 | Temp 98.3°F | Resp 18 | Ht 68.0 in | Wt 134.2 lb

## 2015-06-29 DIAGNOSIS — M810 Age-related osteoporosis without current pathological fracture: Secondary | ICD-10-CM

## 2015-06-29 DIAGNOSIS — C50919 Malignant neoplasm of unspecified site of unspecified female breast: Secondary | ICD-10-CM

## 2015-06-29 DIAGNOSIS — Z853 Personal history of malignant neoplasm of breast: Secondary | ICD-10-CM | POA: Diagnosis not present

## 2015-06-29 DIAGNOSIS — Z85118 Personal history of other malignant neoplasm of bronchus and lung: Secondary | ICD-10-CM

## 2015-06-29 DIAGNOSIS — C3412 Malignant neoplasm of upper lobe, left bronchus or lung: Secondary | ICD-10-CM

## 2015-06-29 LAB — CBC WITH DIFFERENTIAL/PLATELET
BASO%: 0.8 % (ref 0.0–2.0)
BASOS ABS: 0 10*3/uL (ref 0.0–0.1)
EOS ABS: 0.2 10*3/uL (ref 0.0–0.5)
EOS%: 2.9 % (ref 0.0–7.0)
HEMATOCRIT: 35.7 % (ref 34.8–46.6)
HGB: 11.5 g/dL — ABNORMAL LOW (ref 11.6–15.9)
LYMPH#: 1 10*3/uL (ref 0.9–3.3)
LYMPH%: 19.3 % (ref 14.0–49.7)
MCH: 29.2 pg (ref 25.1–34.0)
MCHC: 32.2 g/dL (ref 31.5–36.0)
MCV: 90.6 fL (ref 79.5–101.0)
MONO#: 0.9 10*3/uL (ref 0.1–0.9)
MONO%: 17.7 % — ABNORMAL HIGH (ref 0.0–14.0)
NEUT#: 3 10*3/uL (ref 1.5–6.5)
NEUT%: 59.3 % (ref 38.4–76.8)
PLATELETS: 504 10*3/uL — AB (ref 145–400)
RBC: 3.94 10*6/uL (ref 3.70–5.45)
RDW: 13.6 % (ref 11.2–14.5)
WBC: 5.1 10*3/uL (ref 3.9–10.3)

## 2015-06-29 LAB — COMPREHENSIVE METABOLIC PANEL (CC13)
ALT: 15 U/L (ref 0–55)
ANION GAP: 7 meq/L (ref 3–11)
AST: 17 U/L (ref 5–34)
Albumin: 3.4 g/dL — ABNORMAL LOW (ref 3.5–5.0)
Alkaline Phosphatase: 89 U/L (ref 40–150)
BUN: 20.1 mg/dL (ref 7.0–26.0)
CO2: 27 mEq/L (ref 22–29)
Calcium: 10 mg/dL (ref 8.4–10.4)
Chloride: 105 mEq/L (ref 98–109)
Creatinine: 1 mg/dL (ref 0.6–1.1)
EGFR: 53 mL/min/{1.73_m2} — ABNORMAL LOW (ref >90)
Glucose: 101 mg/dl (ref 70–140)
Potassium: 4.1 mEq/L (ref 3.5–5.1)
Sodium: 139 mEq/L (ref 136–145)
Total Bilirubin: 0.82 mg/dL (ref 0.20–1.20)
Total Protein: 6.7 g/dL (ref 6.4–8.3)

## 2015-06-29 NOTE — Progress Notes (Signed)
Pt stated she cancelled her injection for today . She is f/u with orthopedics.

## 2015-06-29 NOTE — Telephone Encounter (Signed)
Gave and prnted appt sched and avs fo rpt for NOV...gv barium

## 2015-06-29 NOTE — Progress Notes (Signed)
Kristin [name] [phone]   [phone]  OFFICE PROGRESS NOTE  Eber Hong, MD [address] [place] [address]  Principle Diagnosis:  1) Metastatic non-small cell lung cancer adenocarcinoma with positive for EGFR mutation at exon 21 diagnosed in March of 2012.  2) history of breast cancer.   Prior Therapy: None.   Current therapy:  1) Tarceva at 150 mg by mouth daily status post 55 months therapy.  2) Femara 2.5 mg by mouth daily.   CHEMOTHERAPY INTENT: palliative  CURRENT # OF CHEMOTHERAPY CYCLES: 56 CURRENT ANTIEMETICS: Compazine when necessary  CURRENT SMOKING STATUS: Never smoker  ORAL CHEMOTHERAPY AND CONSENT: Tarceva and Femara.  CURRENT BISPHOSPHONATES USE: None  PAIN MANAGEMENT: None  NARCOTICS INDUCED CONSTIPATION: None  LIVING WILL AND CODE STATUS: Full code initially but no prolonged resuscitation.   INTERVAL HISTORY: Kristin [name] 74 y.o. female returns to the clinic today for two-month follow up visit. The patient is feeling fine today with no specific complaints. The patient denied having any chest pain, shortness of breath, cough or hemoptysis. She is tolerating her treatment with Tarceva and Femara fairly well except for the skin rash. She denied having any significant nausea or vomiting, no fever or chills. She has no weight loss or night sweats. She is here today for evaluation and repeat blood work.  MEDICAL HISTORY: Past Medical History  Diagnosis Date  . Hypertension   . Anemia   . Stroke   . Breast cancer 2002    bilateral  . Lung cancer dx'd 12/2010    ALLERGIES:  is allergic to sulfa antibiotics.  MEDICATIONS:  Current Outpatient Prescriptions  Medication Sig Dispense Refill  . amLODipine (NORVASC) 5 MG tablet Take 5 mg by mouth daily.      . brimonidine-timolol (COMBIGAN) 0.2-0.5 % ophthalmic solution Place 1 drop into the right eye every 12 (twelve) hours.    . Calcium Carb-Cholecalciferol  (RA CALCIUM 600/VITAMIN D-3) 600-400 MG-UNIT TABS Take 2 tablets by mouth daily.      . cefUROXime (CEFTIN) 250 MG tablet     . clindamycin (CLEOCIN T) 1 % lotion APPLY TWICE DAILY AS DIRECTED 60 mL 1  . clopidogrel (PLAVIX) 75 MG tablet Take 1 tablet (75 mg total) by mouth daily with breakfast. 90 tablet 3  . cycloSPORINE (RESTASIS) 0.05 % ophthalmic emulsion Place 1 drop into both eyes 2 (two) times daily.    Marland Kitchen erlotinib (TARCEVA) 150 MG tablet Take 1 tablet (150 mg total) by mouth daily. Take on an empty stomach 1 hour before meals or 2 hours after. 30 tablet 1  . escitalopram (LEXAPRO) 10 MG tablet Take 10 mg by mouth daily. Takes 98m and 195mtablet daily = 1529maily    . famotidine (PEPCID) 40 MG tablet Take 40 mg by mouth daily.      . FMarland KitchenFum-FePoly-FA-B Cmp-C-Biot (FOLIVANE-PLUS) CAPS TAKE 1 CAPSULE BY MOUTH DAILY 90 capsule 1  . ibuprofen (ADVIL,MOTRIN) 200 MG tablet Take 200 mg by mouth as needed.    . lMarland Kitchentrozole (FEMARA) 2.5 MG tablet Take 1 tablet (2.5 mg total) by mouth daily. 30 tablet 2  . lisinopril (PRINIVIL,ZESTRIL) 20 MG tablet     . prochlorperazine (COMPAZINE) 10 MG tablet Take 1 tablet (10 mg total) by mouth every 6 (six) hours as needed for nausea or vomiting. (Patient not taking: Reported on 04/25/2015) 30 tablet 0  . zolpidem (AMBIEN) 5 MG tablet at bedtime as needed.     . [  DISCONTINUED] erlotinib (TARCEVA) 150 MG tablet Take 1 tablet (150 mg total) by mouth daily. 30 tablet 2   No current facility-administered medications for this visit.    SURGICAL HISTORY:  Past Surgical History  Procedure Laterality Date  . Appendectomy    . Mastectomy      bilateral  . Lung surgery    . Cataracts      bilateral  . Colonoscopy    . Tonsillectomy    . Tee without cardioversion N/A 06/04/2013    Procedure: TRANSESOPHAGEAL ECHOCARDIOGRAM (TEE);  Surgeon: Jolaine Artist, MD;  Location: [facility] END[facility]COPY;  Service: Cardiovascular;  Laterality: N/A;    REVIEW OF SYSTEMS:  A  comprehensive review of systems was negative except for: Integument/breast: positive for rash   PHYSICAL EXAMINATION: General appearance: alert, cooperative and no distress Head: Normocephalic, without obvious abnormality, atraumatic Neck: no adenopathy, no JVD and thyroid not enlarged, symmetric, no tenderness/mass/nodules Lymph nodes: Cervical, supraclavicular, and axillary nodes normal. Resp: clear to auscultation bilaterally and normal percussion bilaterally Back: symmetric, no curvature. ROM normal. No CVA tenderness. Cardio: regular rate and rhythm, S1, S2 normal, no murmur, click, rub or gallop GI: soft, non-tender; bowel sounds normal; no masses,  no organomegaly Extremities: extremities normal, atraumatic, no cyanosis or edema and Grade 2 skin rash on the lower extremities Neurologic: Alert and oriented X 3, normal strength and tone. Normal symmetric reflexes. Normal coordination and gait  ECOG PERFORMANCE STATUS: 1 - Symptomatic but completely ambulatory  Blood pressure 151/55, pulse 60, temperature 98.3 F (36.8 C), temperature source Oral, resp. rate 18, height 5' 8"  (1.727 m), weight 134 lb 3.2 oz (60.873 kg), SpO2 100 %.  LABORATORY DATA: Lab Results  Component Value Date   WBC 5.1 06/29/2015   HGB 11.5* 06/29/2015   HCT 35.7 06/29/2015   MCV 90.6 06/29/2015   PLT 504* 06/29/2015      Chemistry      Component Value Date/Time   NA 139 04/18/2015 0906   NA 136 05/31/2014 1058   NA 147* 03/26/2012 1022   K 4.6 04/18/2015 0906   K 4.1 05/31/2014 1058   K 4.8* 03/26/2012 1022   CL 104 05/31/2014 1058   CL 107 04/05/2013 0754   CL 102 03/26/2012 1022   CO2 30* 04/18/2015 0906   CO2 26 05/31/2014 1058   CO2 31 03/26/2012 1022   BUN 17.9 04/18/2015 0906   BUN 17 05/31/2014 1058   BUN 14 03/26/2012 1022   CREATININE 0.9 04/18/2015 0906   CREATININE 0.90 05/31/2014 1058   CREATININE 0.7 03/26/2012 1022      Component Value Date/Time   CALCIUM 9.6 04/18/2015 0906    CALCIUM 9.2 05/31/2014 1058   CALCIUM 9.1 03/26/2012 1022   ALKPHOS 84 04/18/2015 0906   ALKPHOS 80 05/31/2014 1058   ALKPHOS 84 03/26/2012 1022   AST 19 04/18/2015 0906   AST 18 05/31/2014 1058   AST 26 03/26/2012 1022   ALT 16 04/18/2015 0906   ALT 13 05/31/2014 1058   ALT 20 03/26/2012 1022   BILITOT 0.56 04/18/2015 0906   BILITOT 0.4 05/31/2014 1058   BILITOT 0.70 03/26/2012 1022        RADIOGRAPHIC STUDIES: No results found.  ASSESSMENT AND PLAN: this is a very pleasant 74 years old white female with history of metastatic non-small cell lung cancer, adenocarcinoma currently on treatment with Tarceva status post 55 months of treatment. The patient is feeling fine with no specific complaints and tolerating her treatment  well. I recommended for the patient to continue her current treatment with Tarceva with the same dose.  For the history of the breast cancer, the patient will continue on Femara 2.5 mg by mouth daily. For the osteoporosis, the patient will continue on Prolia every 6 months. She would come back for follow-up visit in 2 months for reevaluation with repeat blood work in addition to CT scan of the chest, abdomen and pelvis. The patient voices understanding of current disease status and treatment options and is in agreement with the current care plan.  All questions were answered. The patient knows to call the clinic with any problems, questions or concerns. We can certainly see the patient much sooner if necessary.  Disclaimer: This note was dictated with voice recognition software. Similar sounding words can inadvertently be transcribed and may not be corrected upon review.

## 2015-07-19 ENCOUNTER — Other Ambulatory Visit: Payer: Self-pay

## 2015-07-19 DIAGNOSIS — C3412 Malignant neoplasm of upper lobe, left bronchus or lung: Secondary | ICD-10-CM

## 2015-07-19 MED ORDER — ERLOTINIB HCL 150 MG PO TABS: 150.0000 mg | ORAL_TABLET | Freq: Every day | ORAL | Status: DC

## 2015-07-19 NOTE — Telephone Encounter (Signed)
Last ov 06/29/15.  Next ov 08/30/15.  Chart reviewed

## 2015-07-20 ENCOUNTER — Telehealth: Payer: Self-pay | Admitting: *Deleted

## 2015-07-20 DIAGNOSIS — C3412 Malignant neoplasm of upper lobe, left bronchus or lung: Secondary | ICD-10-CM

## 2015-07-20 MED ORDER — ERLOTINIB HCL 150 MG PO TABS: 150.0000 mg | ORAL_TABLET | Freq: Every day | ORAL | Status: DC

## 2015-07-20 NOTE — Telephone Encounter (Signed)
Call from [place] at [facility] -refill request on Tarceva '150mg'$  PO Daily. Returned call to [employer], Rx was sent escribe 9/28 (did not go through). Rx refill requested over phone to [facility]

## 2015-07-27 ENCOUNTER — Other Ambulatory Visit: Payer: Self-pay | Admitting: Medical Oncology

## 2015-07-27 DIAGNOSIS — Z853 Personal history of malignant neoplasm of breast: Secondary | ICD-10-CM

## 2015-07-27 MED ORDER — LETROZOLE 2.5 MG PO TABS: 2.5000 mg | ORAL_TABLET | Freq: Every day | ORAL | Status: DC

## 2015-08-24 ENCOUNTER — Ambulatory Visit (HOSPITAL_COMMUNITY)
Admission: RE | Admit: 2015-08-24 | Discharge: 2015-08-24 | Disposition: A | Discharge status: Home / Self Care | Payer: Medicare Other | Source: Ambulatory Visit | Attending: Internal Medicine | Admitting: Internal Medicine

## 2015-08-24 ENCOUNTER — Encounter (HOSPITAL_COMMUNITY): Payer: Self-pay

## 2015-08-24 ENCOUNTER — Other Ambulatory Visit (HOSPITAL_BASED_OUTPATIENT_CLINIC_OR_DEPARTMENT_OTHER): Payer: Medicare Other

## 2015-08-24 DIAGNOSIS — Z08 Encounter for follow-up examination after completed treatment for malignant neoplasm: Secondary | ICD-10-CM | POA: Insufficient documentation

## 2015-08-24 DIAGNOSIS — C3412 Malignant neoplasm of upper lobe, left bronchus or lung: Secondary | ICD-10-CM

## 2015-08-24 DIAGNOSIS — Z853 Personal history of malignant neoplasm of breast: Secondary | ICD-10-CM | POA: Insufficient documentation

## 2015-08-24 DIAGNOSIS — K449 Diaphragmatic hernia without obstruction or gangrene: Secondary | ICD-10-CM | POA: Insufficient documentation

## 2015-08-24 DIAGNOSIS — I700 Atherosclerosis of aorta: Secondary | ICD-10-CM | POA: Insufficient documentation

## 2015-08-24 DIAGNOSIS — C3492 Malignant neoplasm of unspecified part of left bronchus or lung: Secondary | ICD-10-CM | POA: Diagnosis present

## 2015-08-24 DIAGNOSIS — D259 Leiomyoma of uterus, unspecified: Secondary | ICD-10-CM | POA: Diagnosis not present

## 2015-08-24 DIAGNOSIS — Z9889 Other specified postprocedural states: Secondary | ICD-10-CM | POA: Diagnosis not present

## 2015-08-24 DIAGNOSIS — Z85118 Personal history of other malignant neoplasm of bronchus and lung: Secondary | ICD-10-CM

## 2015-08-24 LAB — CBC WITH DIFFERENTIAL/PLATELET
BASO%: 1.4 % (ref 0.0–2.0)
Basophils Absolute: 0.1 10*3/uL (ref 0.0–0.1)
EOS%: 5.4 % (ref 0.0–7.0)
Eosinophils Absolute: 0.3 10*3/uL (ref 0.0–0.5)
HCT: 32.9 % — ABNORMAL LOW (ref 34.8–46.6)
HGB: 10.7 g/dL — ABNORMAL LOW (ref 11.6–15.9)
LYMPH%: 18.4 % (ref 14.0–49.7)
MCH: 29.2 pg (ref 25.1–34.0)
MCHC: 32.5 g/dL (ref 31.5–36.0)
MCV: 89.8 fL (ref 79.5–101.0)
MONO#: 0.8 10*3/uL (ref 0.1–0.9)
MONO%: 14.2 % — ABNORMAL HIGH (ref 0.0–14.0)
NEUT#: 3.3 10*3/uL (ref 1.5–6.5)
NEUT%: 60.6 % (ref 38.4–76.8)
Platelets: 461 10*3/uL — ABNORMAL HIGH (ref 145–400)
RBC: 3.66 10*6/uL — ABNORMAL LOW (ref 3.70–5.45)
RDW: 15 % — ABNORMAL HIGH (ref 11.2–14.5)
WBC: 5.4 10*3/uL (ref 3.9–10.3)
lymph#: 1 10*3/uL (ref 0.9–3.3)

## 2015-08-24 LAB — COMPREHENSIVE METABOLIC PANEL (CC13)
ALT: 21 U/L (ref 0–55)
AST: 21 U/L (ref 5–34)
Albumin: 3.3 g/dL — ABNORMAL LOW (ref 3.5–5.0)
Alkaline Phosphatase: 83 U/L (ref 40–150)
Anion Gap: 3 mEq/L (ref 3–11)
BUN: 16 mg/dL (ref 7.0–26.0)
CHLORIDE: 107 meq/L (ref 98–109)
CO2: 29 meq/L (ref 22–29)
CREATININE: 1 mg/dL (ref 0.6–1.1)
Calcium: 9.9 mg/dL (ref 8.4–10.4)
EGFR: 58 mL/min/{1.73_m2} — ABNORMAL LOW (ref >90)
GLUCOSE: 96 mg/dL (ref 70–140)
Potassium: 4.5 mEq/L (ref 3.5–5.1)
Sodium: 139 mEq/L (ref 136–145)
TOTAL PROTEIN: 6.1 g/dL — AB (ref 6.4–8.3)
Total Bilirubin: 0.51 mg/dL (ref 0.20–1.20)

## 2015-08-24 MED ORDER — IOHEXOL 300 MG/ML  SOLN
100.0000 mL | Freq: Once | INTRAMUSCULAR | Status: AC | PRN
  Administered 2015-08-24: 100 mL via INTRAVENOUS

## 2015-08-30 ENCOUNTER — Ambulatory Visit (HOSPITAL_BASED_OUTPATIENT_CLINIC_OR_DEPARTMENT_OTHER): Payer: Medicare Other | Admitting: Internal Medicine

## 2015-08-30 ENCOUNTER — Encounter: Payer: Self-pay | Admitting: Internal Medicine

## 2015-08-30 ENCOUNTER — Telehealth: Payer: Self-pay | Admitting: Internal Medicine

## 2015-08-30 VITALS — BP 153/51 | HR 54 | Temp 98.0°F | Resp 18 | Ht 68.0 in | Wt 137.8 lb

## 2015-08-30 DIAGNOSIS — M810 Age-related osteoporosis without current pathological fracture: Secondary | ICD-10-CM | POA: Diagnosis not present

## 2015-08-30 DIAGNOSIS — C3412 Malignant neoplasm of upper lobe, left bronchus or lung: Secondary | ICD-10-CM

## 2015-08-30 NOTE — Telephone Encounter (Signed)
per pof to sch pt appt-gave pt copyo of avs

## 2015-08-30 NOTE — Progress Notes (Signed)
[place] [phone]   Fax:(336) [phone]  OFFICE PROGRESS NOTE  Eber Hong, MD 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  2) history of breast cancer.   Prior Therapy: None.   Current therapy:  1) Tarceva at 150 mg by mouth daily status post 57 months therapy.  2) Femara 2.5 mg by mouth daily.   CHEMOTHERAPY INTENT: palliative  CURRENT # OF CHEMOTHERAPY CYCLES: 58 CURRENT ANTIEMETICS: Compazine when necessary  CURRENT SMOKING STATUS: Never smoker  ORAL CHEMOTHERAPY AND CONSENT: Tarceva and Femara.  CURRENT BISPHOSPHONATES USE: None  PAIN MANAGEMENT: None  NARCOTICS INDUCED CONSTIPATION: None  LIVING WILL AND CODE STATUS: Full code initially but no prolonged resuscitation.   INTERVAL HISTORY: Kristin [name] 74 y.o. female returns to the clinic today for two-month follow up visit accompanied by her daughter. The patient is feeling fine today with no specific complaints. She gained few pounds from the last visit The patient denied having any chest pain, shortness of breath, cough or hemoptysis. She denied having any rectal bleeding or black tarry stool. She is tolerating her treatment with Tarceva and Femara fairly well except for the skin rash. She denied having any significant nausea or vomiting, no fever or chills. She has no weight loss or night sweats. The patient had repeat CT scan of the chest performed recently and she is here for evaluation and discussion of her scan results.  MEDICAL HISTORY: Past Medical History  Diagnosis Date  . Hypertension   . Anemia   . Stroke ([place])   . Breast cancer ([place]) 2002    bilateral  . Lung cancer ([place]) dx'd 12/2010    ALLERGIES:  is allergic to doxycycline and sulfa antibiotics.  MEDICATIONS:  Current Outpatient Prescriptions  Medication Sig  Dispense Refill  . amLODipine (NORVASC) 5 MG tablet Take 5 mg by mouth daily.      . brimonidine-timolol (COMBIGAN) 0.2-0.5 % ophthalmic solution Place 1 drop into the right eye every 12 (twelve) hours.    . Calcium Carb-Cholecalciferol (RA CALCIUM 600/VITAMIN D-3) 600-400 MG-UNIT TABS Take 2 tablets by mouth daily.      . cefUROXime (CEFTIN) 250 MG tablet     . clindamycin (CLEOCIN T) 1 % lotion APPLY TWICE DAILY AS DIRECTED 60 mL 1  . clopidogrel (PLAVIX) 75 MG tablet Take 1 tablet (75 mg total) by mouth daily with breakfast. 90 tablet 3  . cycloSPORINE (RESTASIS) 0.05 % ophthalmic emulsion Place 1 drop into both eyes 2 (two) times daily.    Marland Kitchen erlotinib (TARCEVA) 150 MG tablet Take 1 tablet (150 mg total) by mouth daily. Take on an empty stomach 1 hour before meals or 2 hours after. 30 tablet 1  . escitalopram (LEXAPRO) 10 MG tablet Take 10 mg by mouth daily. Takes 14m and 148mtablet daily = 1545maily    . famotidine (PEPCID) 40 MG tablet Take 40 mg by mouth daily.      . FMarland KitchenFum-FePoly-FA-B Cmp-C-Biot (FOLIVANE-PLUS) CAPS TAKE 1 CAPSULE BY MOUTH DAILY 90 capsule 1  . ibuprofen (ADVIL,MOTRIN) 200 MG tablet Take 200 mg by mouth as needed.    . lMarland Kitchentrozole (FEMARA) 2.5 MG tablet Take 1 tablet (2.5 mg total) by mouth daily. 30 tablet 2  . lisinopril (PRINIVIL,ZESTRIL) 20 MG tablet     . prochlorperazine (COMPAZINE) 10 MG tablet Take 1  tablet (10 mg total) by mouth every 6 (six) hours as needed for nausea or vomiting. 30 tablet 0  . zolpidem (AMBIEN) 5 MG tablet at bedtime as needed.     . [DISCONTINUED] erlotinib (TARCEVA) 150 MG tablet Take 1 tablet (150 mg total) by mouth daily. 30 tablet 2   No current facility-administered medications for this visit.    SURGICAL HISTORY:  Past Surgical History  Procedure Laterality Date  . Appendectomy    . Mastectomy      bilateral  . Lung surgery    . Cataracts      bilateral  . Colonoscopy    . Tonsillectomy    . Tee without cardioversion N/A  06/04/2013    Procedure: TRANSESOPHAGEAL ECHOCARDIOGRAM (TEE);  Surgeon: Jolaine Artist, MD;  Location: [facility] ENDOSCOPY;  Service: Cardiovascular;  Laterality: N/A;    REVIEW OF SYSTEMS:  Constitutional: negative Eyes: negative Ears, nose, mouth, throat, and face: negative Respiratory: negative Cardiovascular: negative Gastrointestinal: negative Genitourinary:negative Integument/breast: negative Hematologic/lymphatic: negative Musculoskeletal:negative Neurological: negative Behavioral/Psych: negative Endocrine: negative Allergic/Immunologic: negative   PHYSICAL EXAMINATION: General appearance: alert, cooperative and no distress Head: Normocephalic, without obvious abnormality, atraumatic Neck: no adenopathy, no JVD and thyroid not enlarged, symmetric, no tenderness/mass/nodules Lymph nodes: Cervical, supraclavicular, and axillary nodes normal. Resp: clear to auscultation bilaterally and normal percussion bilaterally Back: symmetric, no curvature. ROM normal. No CVA tenderness. Cardio: regular rate and rhythm, S1, S2 normal, no murmur, click, rub or gallop GI: soft, non-tender; bowel sounds normal; no masses,  no organomegaly Extremities: extremities normal, atraumatic, no cyanosis or edema and Grade 2 skin rash on the lower extremities Neurologic: Alert and oriented X 3, normal strength and tone. Normal symmetric reflexes. Normal coordination and gait  ECOG PERFORMANCE STATUS: 1 - Symptomatic but completely ambulatory  Blood pressure 153/51, pulse 54, temperature 98 F (36.7 C), temperature source Oral, resp. rate 18, height _0  (1.727 m), weight 137 lb 12.8 oz (62.506 kg), SpO2 99 %.  LABORATORY DATA: Lab Results  Component Value Date   WBC 5.4 08/24/2015   HGB 10.7* 08/24/2015   HCT 32.9* 08/24/2015   MCV 89.8 08/24/2015   PLT 461* 08/24/2015      Chemistry      Component Value Date/Time   NA 139 08/24/2015 0944   NA 136 05/31/2014 1058   NA 147* 03/26/2012 1022     K 4.5 08/24/2015 0944   K 4.1 05/31/2014 1058   K 4.8* 03/26/2012 1022   CL 104 05/31/2014 1058   CL 107 04/05/2013 0754   CL 102 03/26/2012 1022   CO2 29 08/24/2015 0944   CO2 26 05/31/2014 1058   CO2 31 03/26/2012 1022   BUN 16.0 08/24/2015 0944   BUN 17 05/31/2014 1058   BUN 14 03/26/2012 1022   CREATININE 1.0 08/24/2015 0944   CREATININE 0.90 05/31/2014 1058   CREATININE 0.7 03/26/2012 1022      Component Value Date/Time   CALCIUM 9.9 08/24/2015 0944   CALCIUM 9.2 05/31/2014 1058   CALCIUM 9.1 03/26/2012 1022   ALKPHOS 83 08/24/2015 0944   ALKPHOS 80 05/31/2014 1058   ALKPHOS 84 03/26/2012 1022   AST 21 08/24/2015 0944   AST 18 05/31/2014 1058   AST 26 03/26/2012 1022   ALT 21 08/24/2015 0944   ALT 13 05/31/2014 1058   ALT 20 03/26/2012 1022   BILITOT 0.51 08/24/2015 0944   BILITOT 0.4 05/31/2014 1058   BILITOT 0.70 03/26/2012 1022  RADIOGRAPHIC STUDIES: Ct Chest W Contrast  08/24/2015  CLINICAL DATA:  Restaging LEFT lung cancer. History of breast cancer. Subsequent treatment evaluation EXAM: CT CHEST, ABDOMEN, AND PELVIS WITH CONTRAST TECHNIQUE: Multidetector CT imaging of the chest, abdomen and pelvis was performed following the standard protocol during bolus administration of intravenous contrast. CONTRAST:  145m OMNIPAQUE IOHEXOL 300 MG/ML  SOLN COMPARISON:  CT 04/18/2015. FINDINGS: CT CHEST FINDINGS CT CHEST FINDINGS Mediastinum/Nodes: No axillary supraclavicular lymphadenopathy. No mediastinal hilar adenopathy. No pericardial fluid. Large hiatal hernia noted. Central airways are normal. Multiple small nodules in the thyroid gland. Lungs/Pleura: Linear scarring in the LEFT upper lobe and along the surgical margin. No new nodularity. No suspicious nodules within the lungs. Lungs are hyperinflated. Musculoskeletal: No aggressive osseous lesion. CT ABDOMEN AND PELVIS FINDINGS Hepatobiliary: Benign hepatic cyst in the RIGHT hepatic lobe. For normal gallbladder.  Pancreas: Pancreas is normal. No ductal dilatation. No pancreatic inflammation. Spleen: Normal spleen Adrenals/urinary tract: Adrenal glands and kidneys are normal. The ureters and bladder normal. Stomach/Bowel: Stomach, small bowel, appendix, and cecum are normal. The colon and rectosigmoid colon are normal. Vascular/Lymphatic: Abdominal aorta is normal caliber. There is no retroperitoneal or periportal lymphadenopathy. No pelvic lymphadenopathy. Reproductive: Several round dense lesions in the myometrium consistent leiomyoma. Ovaries are normal. Other: No peritoneal metastasis. Musculoskeletal: No aggressive osseous lesion. Dense sclerotic lesion in the LEFT ischium unchanged IMPRESSION: Chest Impression: 1. No evidence lung cancer recurrence. Postsurgical change LEFT upper lobe. 2. Large hiatal hernia 3. Hyperinflated lungs. Abdomen / Pelvis Impression: 1. No evidence of metastasis within abdomen or pelvis. 2. Leiomyoma of uterus. 3.  Atherosclerotic calcification of the abdominal aorta. Electronically Signed   By: SSuzy BouchardM.D.   On: 08/24/2015 12:04   Ct Abdomen Pelvis W Contrast  08/24/2015  CLINICAL DATA:  Restaging LEFT lung cancer. History of breast cancer. Subsequent treatment evaluation EXAM: CT CHEST, ABDOMEN, AND PELVIS WITH CONTRAST TECHNIQUE: Multidetector CT imaging of the chest, abdomen and pelvis was performed following the standard protocol during bolus administration of intravenous contrast. CONTRAST:  1021mOMNIPAQUE IOHEXOL 300 MG/ML  SOLN COMPARISON:  CT 04/18/2015. FINDINGS: CT CHEST FINDINGS CT CHEST FINDINGS Mediastinum/Nodes: No axillary supraclavicular lymphadenopathy. No mediastinal hilar adenopathy. No pericardial fluid. Large hiatal hernia noted. Central airways are normal. Multiple small nodules in the thyroid gland. Lungs/Pleura: Linear scarring in the LEFT upper lobe and along the surgical margin. No new nodularity. No suspicious nodules within the lungs. Lungs are  hyperinflated. Musculoskeletal: No aggressive osseous lesion. CT ABDOMEN AND PELVIS FINDINGS Hepatobiliary: Benign hepatic cyst in the RIGHT hepatic lobe. For normal gallbladder. Pancreas: Pancreas is normal. No ductal dilatation. No pancreatic inflammation. Spleen: Normal spleen Adrenals/urinary tract: Adrenal glands and kidneys are normal. The ureters and bladder normal. Stomach/Bowel: Stomach, small bowel, appendix, and cecum are normal. The colon and rectosigmoid colon are normal. Vascular/Lymphatic: Abdominal aorta is normal caliber. There is no retroperitoneal or periportal lymphadenopathy. No pelvic lymphadenopathy. Reproductive: Several round dense lesions in the myometrium consistent leiomyoma. Ovaries are normal. Other: No peritoneal metastasis. Musculoskeletal: No aggressive osseous lesion. Dense sclerotic lesion in the LEFT ischium unchanged IMPRESSION: Chest Impression: 1. No evidence lung cancer recurrence. Postsurgical change LEFT upper lobe. 2. Large hiatal hernia 3. Hyperinflated lungs. Abdomen / Pelvis Impression: 1. No evidence of metastasis within abdomen or pelvis. 2. Leiomyoma of uterus. 3.  Atherosclerotic calcification of the abdominal aorta. Electronically Signed   By: StSuzy Bouchard.D.   On: 08/24/2015 12:04    ASSESSMENT AND PLAN: this  is a very pleasant 74 years old white female with history of metastatic non-small cell lung cancer, adenocarcinoma currently on treatment with Tarceva status post 57 months of treatment. The patient is feeling fine with no specific complaints and tolerating her treatment well. Her recent CT scan of the chest, abdomen and pelvis showed no evidence for disease progression. I discussed the scan results with the patient and her daughter. I recommended for the patient to continue her current treatment with Tarceva with the same dose.  For the history of the breast cancer, the patient will continue on Femara 2.5 mg by mouth daily. For the  osteoporosis, the patient will continue on Prolia every 6 months. She would come back for follow-up visit in 3 months for reevaluation with repeat blood work. The patient voices understanding of current disease status and treatment options and is in agreement with the current care plan.  All questions were answered. The patient knows to call the clinic with any problems, questions or concerns. We can certainly see the patient much sooner if necessary.  Disclaimer: This note was dictated with voice recognition software. Similar sounding words can inadvertently be transcribed and may not be corrected upon review.

## 2015-10-12 ENCOUNTER — Telehealth: Payer: Self-pay | Admitting: *Deleted

## 2015-10-12 DIAGNOSIS — C3412 Malignant neoplasm of upper lobe, left bronchus or lung: Secondary | ICD-10-CM

## 2015-10-12 MED ORDER — ERLOTINIB HCL 150 MG PO TABS: 150.0000 mg | ORAL_TABLET | Freq: Every day | ORAL | Status: DC

## 2015-10-12 NOTE — Telephone Encounter (Signed)
Patient called reporting she "needs Tarceva refilled.  [place] has not received response from Dr. Julien Nordmann.  Need order by 10-13-2015 for her to receive shipment by 10-18-2015 when I run out.  I've been on this for five years and lately am having to call for authorizations."  Refill sent at this time eRx.

## 2015-10-17 NOTE — Telephone Encounter (Signed)
FYI Voicemail: "I've been trying to get a refill for a week.  I run out tomorrow."  Called patient who reports Tarceva runs out tomorow.  Refill ordered on 10-12-2015.  Patient now has called [facility] learned delivery expected tomorrow.  No further questions or needs.

## 2015-10-18 ENCOUNTER — Encounter: Payer: Self-pay | Admitting: Internal Medicine

## 2015-10-18 NOTE — Progress Notes (Signed)
Per [facility] tarceva will be delivered to patient 10/18/15 with no copay.

## 2015-10-24 ENCOUNTER — Telehealth: Payer: Self-pay | Admitting: Medical Oncology

## 2015-10-24 ENCOUNTER — Other Ambulatory Visit: Payer: Self-pay | Admitting: Medical Oncology

## 2015-10-24 NOTE — Telephone Encounter (Signed)
Pt concerned about taking pepcid while on tarceva.  I told pt she can take tarceva and pepcid if the two drugs are at least 10 hours apart. She said she takes her pepcid in am and tarceva hs

## 2015-10-30 ENCOUNTER — Ambulatory Visit (HOSPITAL_BASED_OUTPATIENT_CLINIC_OR_DEPARTMENT_OTHER): Payer: Medicare Other | Admitting: Internal Medicine

## 2015-10-30 ENCOUNTER — Other Ambulatory Visit (HOSPITAL_BASED_OUTPATIENT_CLINIC_OR_DEPARTMENT_OTHER): Payer: Medicare Other

## 2015-10-30 ENCOUNTER — Encounter: Payer: Self-pay | Admitting: Internal Medicine

## 2015-10-30 VITALS — BP 148/37 | HR 57 | Temp 97.7°F | Resp 17 | Ht 68.0 in | Wt 138.0 lb

## 2015-10-30 DIAGNOSIS — D649 Anemia, unspecified: Secondary | ICD-10-CM | POA: Diagnosis not present

## 2015-10-30 DIAGNOSIS — C3412 Malignant neoplasm of upper lobe, left bronchus or lung: Secondary | ICD-10-CM

## 2015-10-30 DIAGNOSIS — M810 Age-related osteoporosis without current pathological fracture: Secondary | ICD-10-CM

## 2015-10-30 DIAGNOSIS — R195 Other fecal abnormalities: Secondary | ICD-10-CM

## 2015-10-30 LAB — CBC WITH DIFFERENTIAL/PLATELET
BASO%: 1.8 % (ref 0.0–2.0)
Basophils Absolute: 0.1 10*3/uL (ref 0.0–0.1)
EOS ABS: 0.2 10*3/uL (ref 0.0–0.5)
EOS%: 4.4 % (ref 0.0–7.0)
HCT: 30.8 % — ABNORMAL LOW (ref 34.8–46.6)
HGB: 9.7 g/dL — ABNORMAL LOW (ref 11.6–15.9)
LYMPH%: 13.7 % — AB (ref 14.0–49.7)
MCH: 28.3 pg (ref 25.1–34.0)
MCHC: 31.6 g/dL (ref 31.5–36.0)
MCV: 89.7 fL (ref 79.5–101.0)
MONO#: 0.7 10*3/uL (ref 0.1–0.9)
MONO%: 13.6 % (ref 0.0–14.0)
NEUT#: 3.4 10*3/uL (ref 1.5–6.5)
NEUT%: 66.5 % (ref 38.4–76.8)
PLATELETS: 430 10*3/uL — AB (ref 145–400)
RBC: 3.44 10*6/uL — AB (ref 3.70–5.45)
RDW: 14.6 % — ABNORMAL HIGH (ref 11.2–14.5)
WBC: 5.2 10*3/uL (ref 3.9–10.3)
lymph#: 0.7 10*3/uL — ABNORMAL LOW (ref 0.9–3.3)

## 2015-10-30 LAB — COMPREHENSIVE METABOLIC PANEL
ALT: 14 U/L (ref 0–55)
ANION GAP: 6 meq/L (ref 3–11)
AST: 17 U/L (ref 5–34)
Albumin: 3.2 g/dL — ABNORMAL LOW (ref 3.5–5.0)
Alkaline Phosphatase: 81 U/L (ref 40–150)
BILIRUBIN TOTAL: 0.46 mg/dL (ref 0.20–1.20)
BUN: 21.5 mg/dL (ref 7.0–26.0)
CHLORIDE: 109 meq/L (ref 98–109)
CO2: 25 meq/L (ref 22–29)
Calcium: 9.3 mg/dL (ref 8.4–10.4)
Creatinine: 0.9 mg/dL (ref 0.6–1.1)
EGFR: 60 mL/min/{1.73_m2} — AB (ref >90)
GLUCOSE: 100 mg/dL (ref 70–140)
POTASSIUM: 4.8 meq/L (ref 3.5–5.1)
Sodium: 140 mEq/L (ref 136–145)
TOTAL PROTEIN: 6 g/dL — AB (ref 6.4–8.3)

## 2015-10-30 NOTE — Progress Notes (Signed)
[facility] [phone]   [phone]  OFFICE PROGRESS NOTE  Eber Hong, MD [address] [place] [address]  Principle Diagnosis:  1) Metastatic non-small cell lung cancer adenocarcinoma with positive for EGFR mutation at exon 21 diagnosed in March of 2012.  2) history of breast cancer.   Prior Therapy: None.   Current therapy:  1) Tarceva at 150 mg by mouth daily status post 59 months therapy.  2) Femara 2.5 mg by mouth daily.   CHEMOTHERAPY INTENT: palliative  CURRENT # OF CHEMOTHERAPY CYCLES: 60 CURRENT ANTIEMETICS: Compazine when necessary  CURRENT SMOKING STATUS: Never smoker  ORAL CHEMOTHERAPY AND CONSENT: Tarceva and Femara.  CURRENT BISPHOSPHONATES USE: None  PAIN MANAGEMENT: None  NARCOTICS INDUCED CONSTIPATION: None  LIVING WILL AND CODE STATUS: Full code initially but no prolonged resuscitation.   INTERVAL HISTORY: Kristin [name] 75 y.o. female returns to the clinic today for two-month follow up visit accompanied by her daughter, Kristin [name]. The patient is feeling fine today with no specific complaints except for mild fatigue. The patient denied having any chest pain, shortness of breath, cough or hemoptysis. She denied having any rectal bleeding or black tarry stool. She is tolerating her treatment with Tarceva and Femara fairly well except for the skin rash. She denied having any significant nausea or vomiting, no fever or chills. She has no weight loss or night sweats. She was noted on recent CBC to have decline in her hemoglobin and hematocrit. She is currently on oral iron tablets. Her last colonoscopy was a few years ago.  MEDICAL HISTORY: Past Medical History  Diagnosis Date  . Hypertension   . Anemia   . Stroke ([place])   . Breast cancer ([place]) 2002    bilateral  . Lung cancer ([place]) dx'd 12/2010    ALLERGIES:  is allergic to doxycycline and sulfa antibiotics.  MEDICATIONS:  Current Outpatient Prescriptions    Medication Sig Dispense Refill  . amLODipine (NORVASC) 5 MG tablet Take 5 mg by mouth daily.      . brimonidine-timolol (COMBIGAN) 0.2-0.5 % ophthalmic solution Place 1 drop into the right eye every 12 (twelve) hours.    . Calcium Carb-Cholecalciferol (RA CALCIUM 600/VITAMIN D-3) 600-400 MG-UNIT TABS Take 2 tablets by mouth daily.      . cefUROXime (CEFTIN) 250 MG tablet     . clindamycin (CLEOCIN T) 1 % lotion APPLY TWICE DAILY AS DIRECTED 60 mL 1  . clopidogrel (PLAVIX) 75 MG tablet Take 1 tablet (75 mg total) by mouth daily with breakfast. 90 tablet 3  . cycloSPORINE (RESTASIS) 0.05 % ophthalmic emulsion Place 1 drop into both eyes 2 (two) times daily.    Marland Kitchen erlotinib (TARCEVA) 150 MG tablet Take 1 tablet (150 mg total) by mouth daily. Take on an empty stomach 1 hour before meals or 2 hours after. 30 tablet 3  . escitalopram (LEXAPRO) 10 MG tablet Take 10 mg by mouth daily. Takes 71m and 179mtablet daily = 152maily    . FeFum-FePoly-FA-B Cmp-C-Biot (FOLIVANE-PLUS) CAPS TAKE 1 CAPSULE BY MOUTH DAILY 90 capsule 1  . ibuprofen (ADVIL,MOTRIN) 200 MG tablet Take 200 mg by mouth as needed.    . lMarland Kitchentrozole (FEMARA) 2.5 MG tablet Take 1 tablet (2.5 mg total) by mouth daily. 30 tablet 2  . lisinopril (PRINIVIL,ZESTRIL) 20 MG tablet     . prochlorperazine (COMPAZINE) 10 MG tablet Take 1 tablet (10 mg total) by mouth every 6 (six) hours as needed for  nausea or vomiting. 30 tablet 0  . zolpidem (AMBIEN) 5 MG tablet at bedtime as needed.     . [DISCONTINUED] erlotinib (TARCEVA) 150 MG tablet Take 1 tablet (150 mg total) by mouth daily. 30 tablet 2   No current facility-administered medications for this visit.    SURGICAL HISTORY:  Past Surgical History  Procedure Laterality Date  . Appendectomy    . Mastectomy      bilateral  . Lung surgery    . Cataracts      bilateral  . Colonoscopy    . Tonsillectomy    . Tee without cardioversion N/A 06/04/2013    Procedure: TRANSESOPHAGEAL  ECHOCARDIOGRAM (TEE);  Surgeon: Jolaine Artist, MD;  Location: [facility] ENDOSCOPY;  Service: Cardiovascular;  Laterality: N/A;    REVIEW OF SYSTEMS:  A comprehensive review of systems was negative except for: Constitutional: positive for fatigue   PHYSICAL EXAMINATION: General appearance: alert, cooperative and no distress Head: Normocephalic, without obvious abnormality, atraumatic Neck: no adenopathy, no JVD and thyroid not enlarged, symmetric, no tenderness/mass/nodules Lymph nodes: Cervical, supraclavicular, and axillary nodes normal. Resp: clear to auscultation bilaterally and normal percussion bilaterally Back: symmetric, no curvature. ROM normal. No CVA tenderness. Cardio: regular rate and rhythm, S1, S2 normal, no murmur, click, rub or gallop GI: soft, non-tender; bowel sounds normal; no masses,  no organomegaly Extremities: extremities normal, atraumatic, no cyanosis or edema and Grade 2 skin rash on the lower extremities Neurologic: Alert and oriented X 3, normal strength and tone. Normal symmetric reflexes. Normal coordination and gait  ECOG PERFORMANCE STATUS: 1 - Symptomatic but completely ambulatory  There were no vitals taken for this visit.  LABORATORY DATA: Lab Results  Component Value Date   WBC 5.4 08/24/2015   HGB 10.7* 08/24/2015   HCT 32.9* 08/24/2015   MCV 89.8 08/24/2015   PLT 461* 08/24/2015      Chemistry      Component Value Date/Time   NA 139 08/24/2015 0944   NA 136 05/31/2014 1058   NA 147* 03/26/2012 1022   K 4.5 08/24/2015 0944   K 4.1 05/31/2014 1058   K 4.8* 03/26/2012 1022   CL 104 05/31/2014 1058   CL 107 04/05/2013 0754   CL 102 03/26/2012 1022   CO2 29 08/24/2015 0944   CO2 26 05/31/2014 1058   CO2 31 03/26/2012 1022   BUN 16.0 08/24/2015 0944   BUN 17 05/31/2014 1058   BUN 14 03/26/2012 1022   CREATININE 1.0 08/24/2015 0944   CREATININE 0.90 05/31/2014 1058   CREATININE 0.7 03/26/2012 1022      Component Value Date/Time    CALCIUM 9.9 08/24/2015 0944   CALCIUM 9.2 05/31/2014 1058   CALCIUM 9.1 03/26/2012 1022   ALKPHOS 83 08/24/2015 0944   ALKPHOS 80 05/31/2014 1058   ALKPHOS 84 03/26/2012 1022   AST 21 08/24/2015 0944   AST 18 05/31/2014 1058   AST 26 03/26/2012 1022   ALT 21 08/24/2015 0944   ALT 13 05/31/2014 1058   ALT 20 03/26/2012 1022   BILITOT 0.51 08/24/2015 0944   BILITOT 0.4 05/31/2014 1058   BILITOT 0.70 03/26/2012 1022        RADIOGRAPHIC STUDIES: No results found.  ASSESSMENT AND PLAN: this is a very pleasant 75 years old white female with history of metastatic non-small cell lung cancer, adenocarcinoma currently on treatment with Tarceva status post 59 months of treatment. The patient is feeling fine with no specific complaints and tolerating her treatment well.  I recommended for the patient to continue her current treatment with Tarceva with the same dose.  For the history of the breast cancer, the patient will continue on Femara 2.5 mg by mouth daily. For the osteoporosis, the patient will continue on Prolia every 6 months. For the persistent anemia, I gave the patient stool cards for Hemoccult today and if positive for refer her to gastroenterology for evaluation. I also advised the patient to continue on oral iron tablets. She would come back for follow-up visit in 3 months for reevaluation with repeat blood work as well as CT scan of the chest.. The patient voices understanding of current disease status and treatment options and is in agreement with the current care plan.  All questions were answered. The patient knows to call the clinic with any problems, questions or concerns. We can certainly see the patient much sooner if necessary.  Disclaimer: This note was dictated with voice recognition software. Similar sounding words can inadvertently be transcribed and may not be corrected upon review.

## 2015-11-01 ENCOUNTER — Telehealth: Payer: Self-pay | Admitting: Internal Medicine

## 2015-11-01 NOTE — Telephone Encounter (Signed)
Spoke with patient today to confirm April appointments - central will call re ct - patient aware. March appointments cxd due to new order 1/9 for 3 month f/u.

## 2015-11-08 ENCOUNTER — Ambulatory Visit (INDEPENDENT_AMBULATORY_CARE_PROVIDER_SITE_OTHER): Payer: Medicare Other | Admitting: Ophthalmology

## 2015-11-08 DIAGNOSIS — H43813 Vitreous degeneration, bilateral: Secondary | ICD-10-CM | POA: Diagnosis not present

## 2015-11-08 DIAGNOSIS — H35033 Hypertensive retinopathy, bilateral: Secondary | ICD-10-CM | POA: Diagnosis not present

## 2015-11-08 DIAGNOSIS — D3132 Benign neoplasm of left choroid: Secondary | ICD-10-CM | POA: Diagnosis not present

## 2015-11-08 DIAGNOSIS — I10 Essential (primary) hypertension: Secondary | ICD-10-CM

## 2015-11-08 DIAGNOSIS — H35372 Puckering of macula, left eye: Secondary | ICD-10-CM

## 2015-11-09 ENCOUNTER — Other Ambulatory Visit (HOSPITAL_BASED_OUTPATIENT_CLINIC_OR_DEPARTMENT_OTHER): Payer: Medicare Other | Admitting: *Deleted

## 2015-11-09 ENCOUNTER — Telehealth: Payer: Self-pay | Admitting: Medical Oncology

## 2015-11-09 DIAGNOSIS — C3412 Malignant neoplasm of upper lobe, left bronchus or lung: Secondary | ICD-10-CM

## 2015-11-09 DIAGNOSIS — M810 Age-related osteoporosis without current pathological fracture: Secondary | ICD-10-CM

## 2015-11-09 DIAGNOSIS — R195 Other fecal abnormalities: Secondary | ICD-10-CM | POA: Diagnosis not present

## 2015-11-09 LAB — FECAL OCCULT BLOOD, GUAIAC: OCCULT BLOOD: POSITIVE

## 2015-11-09 NOTE — Telephone Encounter (Signed)
I told pt to contact her GI . She used to see Dr Sharlett Iles at [facility] but he " is retiredChief Strategy Officer . I told her to still call the office and tell them Dr Julien Nordmann wants her to see GI for hidden blood in her stool and to call me back if she has any problems

## 2015-11-09 NOTE — Telephone Encounter (Signed)
-----   Message from Curt Bears, MD sent at 11/09/2015  9:49 AM EST ----- Call patient with the result and she needs to see her gastroenterologist

## 2015-11-09 NOTE — Progress Notes (Signed)
Quick Note:  Call patient with the result and she needs to see her gastroenterologist ______

## 2015-11-10 ENCOUNTER — Encounter: Payer: Self-pay | Admitting: Gastroenterology

## 2015-11-11 ENCOUNTER — Other Ambulatory Visit: Payer: Self-pay | Admitting: Internal Medicine

## 2015-11-13 ENCOUNTER — Telehealth: Payer: Self-pay | Admitting: *Deleted

## 2015-11-13 ENCOUNTER — Encounter: Payer: Self-pay | Admitting: Internal Medicine

## 2015-11-13 NOTE — Telephone Encounter (Signed)
Reviewed pt concern with MD, ok for appt to be scheduled in Mar. LMOVM for pt.

## 2015-11-13 NOTE — Progress Notes (Signed)
Per [facility] tarceva shipped 11/14/15  0.00 copay

## 2015-11-13 NOTE — Telephone Encounter (Signed)
"  I have appointment with Dr. Zachery Conch with [facility] 01-01-2016 at 10:00 am.  This is just an appointment.  Is this too far out?  This is the first available.  A colonoscopy can be scheduled after this visit.  Could you let me know Dr. Worthy Flank opinion."  Return number [phone].

## 2015-12-18 ENCOUNTER — Encounter: Payer: Self-pay | Admitting: Internal Medicine

## 2015-12-18 NOTE — Progress Notes (Signed)
Per [facility] tarceva shipped 12/20/15

## 2015-12-26 ENCOUNTER — Ambulatory Visit: Payer: Medicare Other | Admitting: Oncology

## 2015-12-26 ENCOUNTER — Other Ambulatory Visit: Payer: Medicare Other

## 2015-12-26 ENCOUNTER — Ambulatory Visit: Payer: Medicare Other | Admitting: Internal Medicine

## 2016-01-01 ENCOUNTER — Other Ambulatory Visit (INDEPENDENT_AMBULATORY_CARE_PROVIDER_SITE_OTHER): Payer: Medicare Other

## 2016-01-01 ENCOUNTER — Telehealth: Payer: Self-pay

## 2016-01-01 ENCOUNTER — Encounter: Payer: Self-pay | Admitting: Gastroenterology

## 2016-01-01 ENCOUNTER — Ambulatory Visit (INDEPENDENT_AMBULATORY_CARE_PROVIDER_SITE_OTHER): Payer: Medicare Other | Admitting: Gastroenterology

## 2016-01-01 VITALS — BP 118/54 | HR 72 | Ht 66.25 in | Wt 137.2 lb

## 2016-01-01 DIAGNOSIS — D649 Anemia, unspecified: Secondary | ICD-10-CM | POA: Diagnosis not present

## 2016-01-01 DIAGNOSIS — D62 Acute posthemorrhagic anemia: Secondary | ICD-10-CM | POA: Diagnosis not present

## 2016-01-01 DIAGNOSIS — R195 Other fecal abnormalities: Secondary | ICD-10-CM

## 2016-01-01 DIAGNOSIS — Z8673 Personal history of transient ischemic attack (TIA), and cerebral infarction without residual deficits: Secondary | ICD-10-CM | POA: Diagnosis not present

## 2016-01-01 LAB — CBC WITH DIFFERENTIAL/PLATELET
BASOS PCT: 0.4 % (ref 0.0–3.0)
Basophils Absolute: 0 10*3/uL (ref 0.0–0.1)
Eosinophils Absolute: 0.2 10*3/uL (ref 0.0–0.7)
Eosinophils Relative: 2.2 % (ref 0.0–5.0)
HEMATOCRIT: 30.3 % — AB (ref 36.0–46.0)
Hemoglobin: 10 g/dL — ABNORMAL LOW (ref 12.0–15.0)
LYMPHS PCT: 14.2 % (ref 12.0–46.0)
Lymphs Abs: 1 10*3/uL (ref 0.7–4.0)
MCHC: 32.9 g/dL (ref 30.0–36.0)
MCV: 85.6 fl (ref 78.0–100.0)
MONOS PCT: 10.8 % (ref 3.0–12.0)
Monocytes Absolute: 0.8 10*3/uL (ref 0.1–1.0)
NEUTROS ABS: 5.2 10*3/uL (ref 1.4–7.7)
Neutrophils Relative %: 72.4 % (ref 43.0–77.0)
PLATELETS: 550 10*3/uL — AB (ref 150.0–400.0)
RBC: 3.54 Mil/uL — ABNORMAL LOW (ref 3.87–5.11)
RDW: 14.8 % (ref 11.5–15.5)
WBC: 7.2 10*3/uL (ref 4.0–10.5)

## 2016-01-01 LAB — IBC PANEL
Iron: 30 ug/dL — ABNORMAL LOW (ref 42–145)
SATURATION RATIOS: 7.4 % — AB (ref 20.0–50.0)
Transferrin: 288 mg/dL (ref 212.0–360.0)

## 2016-01-01 LAB — FERRITIN: Ferritin: 20.4 ng/mL (ref 10.0–291.0)

## 2016-01-01 MED ORDER — NA SULFATE-K SULFATE-MG SULF 17.5-3.13-1.6 GM/177ML PO SOLN: 1.0000 | Freq: Once | ORAL | Status: DC

## 2016-01-01 MED ORDER — OMEPRAZOLE 20 MG PO CPDR
20.0000 mg | DELAYED_RELEASE_CAPSULE | Freq: Every day | ORAL | Status: DC
Start: 2016-01-01 — End: 2016-04-25

## 2016-01-01 NOTE — Telephone Encounter (Signed)
Kristin [name] called from Dr. Jerene Dilling office stating per Dr. Brynda Greathouse patient can hold Plavix 5 days before her procedure. Patient notified and she verbalized understanding.

## 2016-01-01 NOTE — Progress Notes (Signed)
HPI :  75 y/o female with a history of CVA on plavix, referred to our clinic for anemia and (+) occult blood testing of stools. On plavix for a history of a CVA about 2-3 years ago per patient. She has a history of breast cancer s/p double masectomy about 16 years ago, and she was diagnosed with lung cancer about 4-5 years ago. Currently on Tarceva and states she is in remission.  She denies any blood in her stools although she takes an oral iron pill and states her stools are always "dark". She has been on iron for close to 5 years for chronic anemia. She has a history of receiving blood transfusion about 4 years ago for her anemia but not recently.  She does not see any red blood in the stools. No abdominal pains. She takes seldom advil PRN, she takes this only rarely. She is scheduled for a hip replacement in July for chronic hip pain. She denies any shortness of breath or chest pain, she is very functional. She denies any prior bowel surgery. Her last colonoscopy reports a history of a colostomy in her chart as the type of study performed, but she has not had any prior bowel surgery or history of colostomy that she is aware of. She has had a prior EGD but she cannot remember when or what the results were. She denies a history of PUD. She endorses a history of a large hiatal hernia. Her sister had colon cancer at age greater than 80 years old. Aunt had colon cancer in her 57s. She has had a normal CT chest / abdomen in November in regards to lung cancer surveillance. She thinks her weight is stable.   Colonoscopy 09/2009 - normal Colonoscopy 07/2011 - sigmoid adenoma   Past Medical History  Diagnosis Date  . Hypertension   . Anemia   . Stroke ([place])   . Breast cancer ([place]) 2002    bilateral  . Lung cancer ([place]) dx'd 12/2010  . Tubular adenoma of colon 07/2011     Past Surgical History  Procedure Laterality Date  . Appendectomy    . Mastectomy Bilateral   . Lung surgery    . Cataracts  Bilateral   . Colonoscopy    . Tonsillectomy    . Tee without cardioversion N/A 06/04/2013    Procedure: TRANSESOPHAGEAL ECHOCARDIOGRAM (TEE);  Surgeon: Jolaine Artist, MD;  Location: [facility] ENDOSCOPY;  Service: Cardiovascular;  Laterality: N/A;   Family History  Problem Relation Age of Onset  . Colon cancer Sister   . Lung cancer Father   . Heart disease Mother   . Multiple myeloma Paternal Grandmother   . Brain cancer Paternal Uncle   . Cancer Paternal Aunt     unknown  . Colon cancer Maternal Aunt   . Prostate cancer Maternal Uncle   . Other Maternal Uncle     brain tumor  . Lung cancer Maternal Uncle   . Stomach cancer Paternal Aunt   . Clotting disorder Mother 68    caronary thrombosis   Social History  Substance Use Topics  . Smoking status: Never Smoker   . Smokeless tobacco: Never Used  . Alcohol Use: 0.0 oz/week    0 Standard drinks or equivalent per week     Comment: ocassional beer   Current Outpatient Prescriptions  Medication Sig Dispense Refill  . amLODipine (NORVASC) 5 MG tablet Take 5 mg by mouth daily.      . brimonidine-timolol (COMBIGAN) 0.2-0.5 %  ophthalmic solution Place 1 drop into the right eye every 12 (twelve) hours.    . Calcium Carb-Cholecalciferol (RA CALCIUM 600/VITAMIN D-3) 600-400 MG-UNIT TABS Take 2 tablets by mouth daily.      . clindamycin (CLEOCIN T) 1 % lotion APPLY TWICE DAILY AS DIRECTED 60 mL 1  . clopidogrel (PLAVIX) 75 MG tablet Take 1 tablet (75 mg total) by mouth daily with breakfast. 90 tablet 3  . cycloSPORINE (RESTASIS) 0.05 % ophthalmic emulsion Place 1 drop into both eyes 2 (two) times daily.    Marland Kitchen erlotinib (TARCEVA) 150 MG tablet Take 1 tablet (150 mg total) by mouth daily. Take on an empty stomach 1 hour before meals or 2 hours after. 30 tablet 3  . escitalopram (LEXAPRO) 10 MG tablet Take 10 mg by mouth daily. Takes 76m and 158mtablet daily = 1545maily    . famotidine (PEPCID) 40 MG tablet TAKE ONE TABLET TWICE DAILY    .  FeFum-FePoly-FA-B Cmp-C-Biot (FOLIVANE-PLUS) CAPS TAKE 1 CAPSULE BY MOUTH DAILY 90 capsule 1  . ibuprofen (ADVIL,MOTRIN) 200 MG tablet Take 200 mg by mouth as needed.    . lMarland Kitchentrozole (FEMARA) 2.5 MG tablet TAKE ONE TABLET EACH DAY 30 tablet 2  . lisinopril (PRINIVIL,ZESTRIL) 20 MG tablet     . prochlorperazine (COMPAZINE) 10 MG tablet Take 1 tablet (10 mg total) by mouth every 6 (six) hours as needed for nausea or vomiting. 30 tablet 0  . zolpidem (AMBIEN) 5 MG tablet at bedtime as needed.     . [DISCONTINUED] erlotinib (TARCEVA) 150 MG tablet Take 1 tablet (150 mg total) by mouth daily. 30 tablet 2   No current facility-administered medications for this visit.   Allergies  Allergen Reactions  . Doxycycline Nausea Only  . Sulfa Antibiotics Rash     Review of Systems: All systems reviewed and negative except where noted in HPI.   CBC Latest Ref Rng 10/30/2015 08/24/2015 06/29/2015  WBC 3.9 - 10.3 10e3/uL 5.2 5.4 5.1  Hemoglobin 11.6 - 15.9 g/dL 9.7(L) 10.7(L) 11.5(L)  Hematocrit 34.8 - 46.6 % 30.8(L) 32.9(L) 35.7  Platelets 145 - 400 10e3/uL 430(H) 461(H) 504(H)   Lab Results  Component Value Date   FERRITIN 5* 04/11/2011     Physical Exam: BP 118/54 mmHg  Pulse 72  Ht 5' 6.25" (1.683 m)  Wt 137 lb 4 oz (62.256 kg)  BMI 21.98 kg/m2 Constitutional: Pleasant,well-developed,female in no acute distress. HEENT: Normocephalic and atraumatic. Conjunctivae are pale. No scleral icterus. Neck supple.  Cardiovascular: Normal rate, regular rhythm.  Pulmonary/chest: Effort normal and breath sounds normal. No wheezing, rales or rhonchi. Abdominal: Soft, nondistended, nontender. Bowel sounds active throughout. There are no masses palpable. No hepatomegaly. Extremities: no edema Lymphadenopathy: No cervical adenopathy noted. Neurological: Alert and oriented to person place and time. Skin: Skin is warm and dry. No rashes noted. Psychiatric: Normal mood and affect. Behavior is  normal.   ASSESSMENT AND PLAN: Anemia Occult positive stools History of CVA  74 1o female with history of breast and lung cancer, both in remission, on plavix for history of CVA a few years ago, presenting with worsening anemia as of January with positive occult blood stool testing. Her recent CT scans show no evidence of metastatic disease. I discussed the differential for her anemia and occult bleeding, and recommend an EGD and colonoscopy to evaluate this issue. If these are negative, we will consider capsule study to clear the small bowel. In the interim, will repeat CBC to ensure  no interval worsening of anemia requiring transfusion, and also check iron studies to see if she needs IV iron therapy. In the interim, I asked her to stop NSAIDs and we will start her empirically on omeprazole 63m q day in light of her NSAID use, until her procedures are done. Otherwise, she will need to hold plavix for 5 days prior to her procedures, and contact her prescribing provider to ensure this is okay.   The indications, risks, and benefits of EGD and colonoscopy were explained to the patient in detail. Risks include but are not limited to bleeding, perforation, adverse reaction to medications, and cardiopulmonary compromise. Sequelae include but are not limited to the possibility of surgery, hositalization, and mortality. The patient verbalized understanding and wished to proceed. All questions answered, referred to scheduler and bowel prep ordered. Further recommendations pending results of the exam.   SCarolina Cellar MD [facility]us[facility] LLCGastroenterology Pager [phone]

## 2016-01-01 NOTE — Telephone Encounter (Signed)
Left a message with Dr. Jerene Dilling office to have Dr. Brynda Greathouse to review patient's chart and fax back response for her coming off Plavix prior to her procedures.

## 2016-01-01 NOTE — Telephone Encounter (Signed)
URGENT REQUEST:   01/01/2016   RE: Kristin [name] DOB: 01-13-1941 MRN: [id number]   Dear Dr. Brynda Greathouse,    We have scheduled the above patient for an endoscopic procedure. Our records show that she is on anticoagulation therapy.   Please advise as to how long the patient may come off her therapy of Plavix prior to the procedure, which is scheduled for 01/08/16.  Please fax back your answer to Marlon Pel, [place] at [phone].   Sincerely,    Marlon Pel, CMA [place] Cellar, MD

## 2016-01-01 NOTE — Patient Instructions (Signed)
Your physician has requested that you go to the basement for lab work before leaving today.  You have been scheduled for an endoscopy and colonoscopy. Please follow the written instructions given to you at your visit today. Please pick up your prep supplies at the pharmacy within the next 1-3 days. If you use inhalers (even only as needed), please bring them with you on the day of your procedure. Your physician has requested that you go to www.startemmi.com and enter the access code given to you at your visit today. This web site gives a general overview about your procedure. However, you should still follow specific instructions given to you by our office regarding your preparation for the procedure.  We have sent the following medications to your pharmacy for you to pick up at your convenience:omeprazole.

## 2016-01-05 ENCOUNTER — Other Ambulatory Visit: Payer: Self-pay

## 2016-01-05 DIAGNOSIS — D649 Anemia, unspecified: Secondary | ICD-10-CM

## 2016-01-05 DIAGNOSIS — R195 Other fecal abnormalities: Secondary | ICD-10-CM

## 2016-01-05 MED ORDER — NA SULFATE-K SULFATE-MG SULF 17.5-3.13-1.6 GM/177ML PO SOLN: 1.0000 | Freq: Once | ORAL | Status: DC

## 2016-01-08 ENCOUNTER — Encounter: Payer: Self-pay | Admitting: Gastroenterology

## 2016-01-08 ENCOUNTER — Ambulatory Visit (AMBULATORY_SURGERY_CENTER): Payer: Medicare Other | Admitting: Gastroenterology

## 2016-01-08 VITALS — BP 155/58 | HR 62 | Temp 97.5°F | Resp 24 | Ht 66.0 in | Wt 137.0 lb

## 2016-01-08 DIAGNOSIS — D123 Benign neoplasm of transverse colon: Secondary | ICD-10-CM

## 2016-01-08 DIAGNOSIS — C184 Malignant neoplasm of transverse colon: Secondary | ICD-10-CM

## 2016-01-08 DIAGNOSIS — D122 Benign neoplasm of ascending colon: Secondary | ICD-10-CM | POA: Diagnosis not present

## 2016-01-08 DIAGNOSIS — R195 Other fecal abnormalities: Secondary | ICD-10-CM

## 2016-01-08 DIAGNOSIS — K319 Disease of stomach and duodenum, unspecified: Secondary | ICD-10-CM

## 2016-01-08 DIAGNOSIS — D509 Iron deficiency anemia, unspecified: Secondary | ICD-10-CM

## 2016-01-08 DIAGNOSIS — D649 Anemia, unspecified: Secondary | ICD-10-CM

## 2016-01-08 MED ORDER — SODIUM CHLORIDE 0.9 % IV SOLN: 500.0000 mL | INTRAVENOUS | Status: DC

## 2016-01-08 NOTE — Progress Notes (Signed)
Called to room to assist during endoscopic procedure.  Patient ID and intended procedure confirmed with present staff. Received instructions for my participation in the procedure from the performing physician.  

## 2016-01-08 NOTE — Op Note (Signed)
[place] Patient Name: Kristin [name] Procedure Date: 01/08/2016 3:29 PM MRN: [id number] Endoscopist: Remo Lipps P. Havery Moros , MD Age: 75 Referring MD:  Date of Birth: 1941/06/03 Gender: Female Procedure:                Colonoscopy Indications:              Iron deficiency anemia secondary to chronic blood                            loss, Iron deficiency anemia Medicines:                Monitored Anesthesia Care Procedure:                Pre-Anesthesia Assessment:                           - Prior to the procedure, a History and Physical                            was performed, and patient medications and                            allergies were reviewed. The patient's tolerance of                            previous anesthesia was also reviewed. The risks                            and benefits of the procedure and the sedation                            options and risks were discussed with the patient.                            All questions were answered, and informed consent                            was obtained. Prior Anticoagulants: The patient has                            taken Plavix (clopidogrel), last dose was 5 days                            prior to procedure. ASA Grade Assessment: III - A                            patient with severe systemic disease. After                            reviewing the risks and benefits, the patient was                            deemed in satisfactory condition to undergo the  procedure.                           After obtaining informed consent, the colonoscope                            was passed under direct vision. Throughout the                            procedure, the patient's blood pressure, pulse, and                            oxygen saturations were monitored continuously. The                            Model PCF-H190L [id number]) scope was introduced   through the anus and advanced to the the cecum,                            identified by appendiceal orifice and ileocecal                            valve. The colonoscopy was performed without                            difficulty. The patient tolerated the procedure                            well. The quality of the bowel preparation was                            adequate. The ileocecal valve, appendiceal orifice,                            and rectum were photographed. Scope In: 3:31:18 PM Scope Out: 4:04:37 PM Scope Withdrawal Time: 0 hours 25 minutes 28 seconds  Total Procedure Duration: 0 hours 33 minutes 19 seconds  Findings:      The perianal and digital rectal examinations were normal.      Scattered small-mouthed diverticula were found in the sigmoid colon.      A polypoid non-obstructing mass was found in the ascending colon. The       mass was partially circumferential (involving one-half of the lumen       circumference), it was roughly 4 cm in size, and friable. This was       biopsied with a cold forceps for histology. Area was tattooed with an       injection of Spot (carbon black). Estimated blood loss was minimal.      A 4 mm polyp was found in the transverse colon. The polyp was flat. The       polyp was removed with a cold biopsy forceps. Resection and retrieval       were complete.      A 8 mm polyp was found in the transverse colon. The polyp was flat. The       polyp was removed with a cold snare. Resection and retrieval were  complete.      The exam was otherwise without abnormality on direct and retroflexion       views. Complications:            No immediate complications. Estimated blood loss:                            Minimal. Estimated Blood Loss:     Estimated blood loss was minimal. Impression:               - Diverticulosis in the sigmoid colon.                           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                             Biopsied. Tattooed.                           - One 4 mm polyp in the transverse colon, removed                            with a cold biopsy forceps. Resected and retrieved.                           - One 8 mm polyp in the transverse colon, removed                            with a cold snare. Resected and retrieved.                           - The examination was otherwise normal on direct                            and retroflexion views. Recommendation:           - Patient has a contact number available for                            emergencies. The signs and symptoms of potential                            delayed complications were discussed with the                            patient. Return to normal activities tomorrow.                            Written discharge instructions were provided to the                            patient.                           - Resume previous diet.                           -  Continue present medications.                           - Resume Plavix (clopidogrel) at prior dose in 3                            days.                           - Await pathology results.                           - No recommendation at this time regarding repeat                            colonoscopy. Await pathology results and course Procedure Code(s):        --- Professional ---                           [phone], Colonoscopy, flexible; with removal of                            tumor(s), polyp(s), or other lesion(s) by snare                            technique                           45381, Colonoscopy, flexible; with directed                            submucosal injection(s), any substance                           50037, 59, Colonoscopy, flexible; with biopsy,                            single or multiple CPT copyright 2016 [employer]. All rights reserved. Remo Lipps P. Havery Moros, MD Carlota Raspberry. Armbruster, MD 01/08/2016 4:13:23 PM This report  has been signed electronically. Number of Addenda: 0 Referring MD:      Eber Hong

## 2016-01-08 NOTE — Op Note (Signed)
[place] Patient Name: Kristin [name] Procedure Date: 01/08/2016 3:19 PM MRN: [id number] Endoscopist: Remo Lipps P. Havery Moros , MD Age: 75 Referring MD:  Date of Birth: [birth year] Gender: Female Procedure:                Upper GI endoscopy Indications:              Iron deficiciency anemia, (+) occult blood testing Medicines:                Monitored Anesthesia Care Procedure:                Pre-Anesthesia Assessment:                           - Prior to the procedure, a History and Physical                            was performed, and patient medications and                            allergies were reviewed. The patient's tolerance of                            previous anesthesia was also reviewed. The risks                            and benefits of the procedure and the sedation                            options and risks were discussed with the patient.                            All questions were answered, and informed consent                            was obtained. Prior Anticoagulants: The patient has                            taken Plavix (clopidogrel), last dose was 5 days                            prior to procedure. ASA Grade Assessment: III - A                            patient with severe systemic disease. After                            reviewing the risks and benefits, the patient was                            deemed in satisfactory condition to undergo the                            procedure.  After obtaining informed consent, the endoscope was                            passed under direct vision. Throughout the                            procedure, the patient's blood pressure, pulse, and                            oxygen saturations were monitored continuously. The                            Model GIF-HQ190 [id number]) scope was introduced                            through the mouth, and advanced to the second part                             of duodenum. The upper GI endoscopy was                            accomplished without difficulty. The patient                            tolerated the procedure well. Scope In: Scope Out: Findings:      Esophagogastric landmarks were identified: the Z-line was found at 31       cm, the gastroesophageal junction was found at 31 cm and the site of       hiatal narrowing was found at 40 cm from the incisors.      A 9 cm hiatal hernia was present. No obvious Cameron lesions were noted.      The exam of the esophagus was otherwise normal.      The entire examined stomach was normal. Biopsies were taken with a cold       forceps for Helicobacter pylori testing in light of the patient's       history of iron deficiency.      The duodenal bulb and second portion of the duodenum were normal. Complications:            No immediate complications. Estimated blood loss:                            Minimal. Estimated Blood Loss:     Estimated blood loss was minimal. Impression:               - Esophagogastric landmarks identified.                           - 9 cm hiatal hernia.                           - Normal stomach. Biopsied.                           - Normal duodenal bulb and second portion of the  duodenum. Recommendation:           - Patient has a contact number available for                            emergencies. The signs and symptoms of potential                            delayed complications were discussed with the                            patient. Return to normal activities tomorrow.                            Written discharge instructions were provided to the                            patient.                           - Resume previous diet.                           - Continue present medications.                           - Await pathology results.                           - No repeat upper endoscopy. Procedure  Code(s):        --- Professional ---                           [phone], Esophagogastroduodenoscopy, flexible,                            transoral; with biopsy, single or multiple CPT copyright 2016 [employer]. All rights reserved. Remo Lipps P. Havery Moros, MD Carlota Raspberry. Armbruster, MD 01/08/2016 4:18:02 PM This report has been signed electronically. Number of Addenda: 0 Referring MD:      Eber Hong

## 2016-01-08 NOTE — Progress Notes (Signed)
Report to PACU, RN, vss, BBS= Clear.  

## 2016-01-08 NOTE — Patient Instructions (Addendum)

## 2016-01-09 ENCOUNTER — Telehealth: Payer: Self-pay | Admitting: *Deleted

## 2016-01-09 NOTE — Telephone Encounter (Signed)
  Follow up Call-  Call back number 01/08/2016  Post procedure Call Back phone  # [phone]  Permission to leave phone message Yes     Patient questions:  Do you have a fever, pain , or abdominal swelling? No. Pain Score  0 *  Have you tolerated food without any problems? Yes.    Have you been able to return to your normal activities? Yes.    Do you have any questions about your discharge instructions: Diet   No. Medications  No. Follow up visit  No.  Do you have questions or concerns about your Care? No.  Actions: * If pain score is 4 or above: No action needed, pain <4.

## 2016-01-11 ENCOUNTER — Other Ambulatory Visit (INDEPENDENT_AMBULATORY_CARE_PROVIDER_SITE_OTHER): Payer: Medicare Other

## 2016-01-11 ENCOUNTER — Other Ambulatory Visit: Payer: Self-pay

## 2016-01-11 DIAGNOSIS — C189 Malignant neoplasm of colon, unspecified: Secondary | ICD-10-CM

## 2016-01-11 LAB — BUN: BUN: 16 mg/dL (ref 6–23)

## 2016-01-11 LAB — CREATININE, SERUM: CREATININE: 0.89 mg/dL (ref 0.40–1.20)

## 2016-01-12 LAB — CEA: CEA: 58.6 ng/mL — AB

## 2016-01-15 ENCOUNTER — Ambulatory Visit (INDEPENDENT_AMBULATORY_CARE_PROVIDER_SITE_OTHER)
Admission: RE | Admit: 2016-01-15 | Discharge: 2016-01-15 | Disposition: A | Discharge status: Home / Self Care | Payer: Medicare Other | Source: Ambulatory Visit | Attending: Gastroenterology | Admitting: Gastroenterology

## 2016-01-15 DIAGNOSIS — C189 Malignant neoplasm of colon, unspecified: Secondary | ICD-10-CM

## 2016-01-15 MED ORDER — IOPAMIDOL (ISOVUE-300) INJECTION 61%
100.0000 mL | Freq: Once | INTRAVENOUS | Status: AC | PRN
  Administered 2016-01-15: 100 mL via INTRAVENOUS

## 2016-01-16 ENCOUNTER — Encounter: Payer: Self-pay | Admitting: Internal Medicine

## 2016-01-16 NOTE — Progress Notes (Signed)
Per [facility] tarceva was shipped 3/29/17zero copay

## 2016-01-19 ENCOUNTER — Other Ambulatory Visit: Payer: Self-pay | Admitting: Surgery

## 2016-01-19 NOTE — H&P (Signed)
Kristin [name] 01/19/2016 4:07 PM Location: [facility] Patient #: [id number] DOB: Nov 07, 1940 Married / Language: Kristin [name] / Race: White Female  History of Present Illness Kristin Hector MD; 01/19/2016 5:07 PM) The patient is a 75 year old female who presents with colorectal cancer. Note for "Colorectal cancer": Patient sent by her gastroenterologist for concerns of an ascending colon cancer causing symptomatic anemia.  Pleasant active woman. Comes today with her husband and daughter. History of breast cancer status post mastectomy and TRAM flaps 2002. 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 On Tarceva chemotherapy. 5 years. No evidence of progression or recurrence. Had a stroke about 3 years ago. No A. fib. No clot. Go okay. Started on Plavix anticoagulation. Kristin [name] for her cancers. Found to have hemoglobin 10.7 in November. Sent for gastrology workup. Upper endoscopy conferred her moderate size hiatal hernia. No ulcerations. She takes famotidine and omeprazole. That usually control symptoms quite well. No changes recently. No dysphagia.  Colonoscopy revealed large bulky lesion in ascending colon suspicious for cancer. CT scan notes a liver cyst but no evidence of other abnormalities. Lungs scarring but no evidence of lung recurrence. Surgical consultation requested to see if surgery appropriate.  Patient is relatively active and independent. Upping. Can walk 20 minutes before she has to stop. Stand for balance issues. His been having some joint issues. Tentative plan for left hip arthroplasty in July by Kristin [name]. She normally has a few loose bowel movements in the morning. Usually related to her chemotherapy. She had an appendectomy and tubal ligation intra-abdominally. TRAM flaps for her breast cancer reconstructions.  No personal nor family history of inflammatory bowel disease, irritable bowel  syndrome, allergy such as Celiac Sprue, dietary/dairy problems, colitis, ulcers nor gastritis. No recent sick contacts/gastroenteritis. No travel outside the country. No changes in diet. No dysphagia to solids or liquids. No significant heartburn or reflux. No hematochezia, hematemesis, coffee ground emesis. No evidence of prior gastric/peptic ulceration. Strong family history of cancers in her family.   Other Problems Kristin [name], CMA; 01/19/2016 4:08 PM) Breast Cancer Cerebrovascular Accident Colon Cancer Gastroesophageal Reflux Disease High blood pressure Lung Cancer Transfusion history  Past Surgical History Kristin [name], CMA; 01/19/2016 4:08 PM) Appendectomy Breast Biopsy Bilateral. Breast Reconstruction Bilateral. Cataract Surgery Bilateral. Colon Polyp Removal - Colonoscopy Mastectomy Bilateral. Tonsillectomy  Diagnostic Studies History Kristin [name], CMA; 01/19/2016 4:07 PM) Colonoscopy within last year Pap Smear >5 years ago  Allergies Kristin [name], CMA; 01/19/2016 4:09 PM) Doxycycline *DERMATOLOGICALS* Sulfabenzamide *CHEMICALS*  Medication History (Kristin [name], CMA; 01/19/2016 4:12 PM) AmLODIPine Besylate ('5MG'$  Tablet, Oral) Active. Combigan (0.2-0.5% Solution, Ophthalmic) Active. Clopidogrel Bisulfate ('75MG'$  Tablet, Oral) Active. Tarceva ('150MG'$  Tablet, Oral) Active. Escitalopram Oxalate ('10MG'$  Tablet, Oral) Active. Letrozole (2.'5MG'$  Tablet, Oral) Active. Lisinopril ('20MG'$  Tablet, Oral) Active. Folivane-Plus (Oral) Active. Medications Reconciled  Social History Kristin [name], CMA; 01/19/2016 4:08 PM) Caffeine use Carbonated beverages, Coffee, Tea. No alcohol use No drug use Tobacco use Never smoker.  Family History Kristin [name], CMA; 01/19/2016 4:08 PM) Breast Cancer Sister. Colon Cancer Sister. Colon Polyps Sister, Son. Respiratory Condition Father.  Pregnancy / Birth History Kristin [name],  CMA; 01/19/2016 4:08 PM) Age at menarche 79 years. Age of menopause >62 Gravida 2 Maternal age 61-25 Para 2     Review of Systems Kristin [name] CMA; 01/19/2016 4:08 PM) General Present- Fatigue and Weight Loss. Not Present- Appetite Loss, Chills, Fever, Night Sweats and Weight Gain. Skin Present- Dryness. Not Present- Change in Wart/Mole,  Hives, Jaundice, New Lesions, Non-Healing Wounds, Rash and Ulcer. HEENT Present- Wears glasses/contact lenses. Not Present- Earache, Hearing Loss, Hoarseness, Nose Bleed, Oral Ulcers, Ringing in the Ears, Seasonal Allergies, Sinus Pain, Sore Throat, Visual Disturbances and Yellow Eyes. Respiratory Not Present- Bloody sputum, Chronic Cough, Difficulty Breathing, Snoring and Wheezing. Breast Not Present- Breast Mass, Breast Pain, Nipple Discharge and Skin Changes. Cardiovascular Not Present- Chest Pain, Difficulty Breathing Lying Down, Leg Cramps, Palpitations, Rapid Heart Rate, Shortness of Breath and Swelling of Extremities. Gastrointestinal Not Present- Abdominal Pain, Bloating, Bloody Stool, Change in Bowel Habits, Chronic diarrhea, Constipation, Difficulty Swallowing, Excessive gas, Gets full quickly at meals, Hemorrhoids, Indigestion, Nausea, Rectal Pain and Vomiting. Female Genitourinary Not Present- Frequency, Nocturia, Painful Urination, Pelvic Pain and Urgency. Musculoskeletal Not Present- Back Pain, Joint Pain, Joint Stiffness, Muscle Pain, Muscle Weakness and Swelling of Extremities. Neurological Not Present- Decreased Memory, Fainting, Headaches, Numbness, Seizures, Tingling, Tremor, Trouble walking and Weakness. Psychiatric Not Present- Anxiety, Bipolar, Change in Sleep Pattern, Depression, Fearful and Frequent crying. Endocrine Not Present- Cold Intolerance, Excessive Hunger, Hair Changes, Heat Intolerance, Hot flashes and New Diabetes. Hematology Present- Easy Bruising. Not Present- Excessive bleeding, Gland problems, HIV and Persistent  Infections.  Vitals (Kristin [name] CMA; 01/19/2016 4:09 PM) 01/19/2016 4:08 PM Weight: 136 lb Height: 67in Body Surface Area: 1.72 m Body Mass Index: 21.3 kg/m  Pulse: 76 (Regular)  BP: 158/78 (Sitting, Left Arm, Standard)      Physical Exam Kristin Hector MD; 01/19/2016 5:03 PM)  General Mental Status-Alert. General Appearance-Not in acute distress, Not Sickly. Orientation-Oriented X3. Hydration-Well hydrated. Voice-Normal. Note: Thin but not really cachectic.  Integumentary Global Assessment Upon inspection and palpation of skin surfaces of the - Axillae: non-tender, no inflammation or ulceration, no drainage. and Distribution of scalp and body hair is normal. General Characteristics Temperature - normal warmth is noted.  Head and Neck Head-normocephalic, atraumatic with no lesions or palpable masses. Face Global Assessment - atraumatic, no absence of expression. Neck Global Assessment - no abnormal movements, no bruit auscultated on the right, no bruit auscultated on the left, no decreased range of motion, non-tender. Trachea-midline. Thyroid Gland Characteristics - non-tender.  Eye Eyeball - Left-Extraocular movements intact, No Nystagmus. Eyeball - Right-Extraocular movements intact, No Nystagmus. Cornea - Left-No Hazy. Cornea - Right-No Hazy. Sclera/Conjunctiva - Left-No scleral icterus, No Discharge. Sclera/Conjunctiva - Right-No scleral icterus, No Discharge. Pupil - Left-Direct reaction to light normal. Pupil - Right-Direct reaction to light normal.  ENMT Ears Pinna - Left - no drainage observed, no generalized tenderness observed. Right - no drainage observed, no generalized tenderness observed. Nose and Sinuses External Inspection of the Nose - no destructive lesion observed. Inspection of the nares - Left - quiet respiration. Right - quiet respiration. Mouth and Throat Lips - Upper Lip - no fissures  observed, no pallor noted. Lower Lip - no fissures observed, no pallor noted. Nasopharynx - no discharge present. Oral Cavity/Oropharynx - Tongue - no dryness observed. Oral Mucosa - no cyanosis observed. Hypopharynx - no evidence of airway distress observed.  Chest and Lung Exam Inspection Movements - Normal and Symmetrical. Accessory muscles - No use of accessory muscles in breathing. Palpation Palpation of the chest reveals - Non-tender. Auscultation Breath sounds - Normal and Clear.  Breast Note: Status post mastectomy with TRAM flaps. No obvious skin abnormalities.  Cardiovascular Auscultation Rhythm - Regular. Murmurs & Other Heart Sounds - Auscultation of the heart reveals - No Murmurs and No Systolic Clicks.  Abdomen Inspection Inspection of the  abdomen reveals - No Visible peristalsis and No Abnormal pulsations. Umbilicus - No Bleeding, No Urine drainage. Palpation/Percussion Palpation and Percussion of the abdomen reveal - Soft, Non Tender, No Rebound tenderness, No Rigidity (guarding) and No Cutaneous hyperesthesia. Note: Narrow somewhat firm and consistent with prior TRAM flaps. Transverse suprapubic incision consistent with prior TRAM flaps. Short abdominal height but soft. No diastases. No umbilical hernia.  Female Genitourinary Sexual Maturity Tanner 5 - Adult hair pattern. Note: No vaginal bleeding nor discharge  Rectal Note: Deferred given recent colonoscopy  Peripheral Vascular Upper Extremity Inspection - Left - No Cyanotic nailbeds, Not Ischemic. Right - No Cyanotic nailbeds, Not Ischemic.  Neurologic Neurologic evaluation reveals -normal attention span and ability to concentrate, able to name objects and repeat phrases. Appropriate fund of knowledge , normal sensation and normal coordination. Mental Status Affect - not angry, not paranoid. Cranial Nerves-Normal Bilaterally. Gait-Normal.  Neuropsychiatric Mental status exam performed with  findings of-able to articulate well with normal speech/language, rate, volume and coherence, thought content normal with ability to perform basic computations and apply abstract reasoning and no evidence of hallucinations, delusions, obsessions or homicidal/suicidal ideation.  Musculoskeletal Global Assessment Spine, Ribs and Pelvis - no instability, subluxation or laxity. Right Upper Extremity - no instability, subluxation or laxity.  Lymphatic Head & Neck  General Head & Neck Lymphatics: Bilateral - Description - No Localized lymphadenopathy. Axillary  General Axillary Region: Bilateral - Description - No Localized lymphadenopathy. Femoral & Inguinal  Generalized Femoral & Inguinal Lymphatics: Left - Description - No Localized lymphadenopathy. Right - Description - No Localized lymphadenopathy.    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 Reasonable candidate for robotic resection with intracorporeal anastomosis he and extraction through smaller incision to avoid hernia issues. Small her abdomen still should be feasible.  She's had numerous cancers but has no evidence recurrence from her breast cancer in 2002. She has no evidence of lung cancer recurrence stable on Tarceva after almost 5 years.  Because she is rather functional independent with a decent quality life, they wish to be aggressive. We'll proceed with surgery.  Give her numerous health issues and okay performance status, would like cardiac clearance. She did have echocardiogram looked okay 3 years ago when she had her stroke. Would like to be sure not missing anything.  I would assume would be okay to come off the Plavix perioperatively. We'll ask primary care physician if he has any hesitancy. Dr. Brynda Greathouse.  Current Plans I recommended obtaining preoperative medical clearance. Make sure it is safe to  come off the Plavix given her history of prior stroke. I am concerned about the health of the patient and the ability to tolerate the operation. Therefore, we will request clearance by medicine to better assess operative risk & see if a reevaluation, further workup, etc is needed. Also recommendations on how medications should be managed/held/restarted after surgery. I recommended obtaining preoperative cardiac clearance. I am concerned about the health of the patient and the ability to tolerate the operation. Therefore, we will request clearance by cardiology to better assess operative risk & see if a reevaluation, further workup, etc is needed. Also recommendations on how medications such as for anticoagulation and blood pressure should be managed/held/restarted after surgery. ENCOUNTER FOR PREOPERATIVE EXAMINATION FOR GENERAL SURGICAL PROCEDURE (Z01.818)  Current Plans You are being scheduled for surgery - Our schedulers will call you.  You should  hear from our office's scheduling department within 5 working days about the location, date, and time of surgery. We try to make accommodations for patient's preferences in scheduling surgery, but sometimes the OR schedule or the surgeon's schedule prevents [place] from making those accommodations.  If you have not heard from our office [phone]) in 5 working days, call the office and ask for your surgeon's nurse.  If you have other questions about your diagnosis, plan, or surgery, call the office and ask for your surgeon's nurse.  Written instructions provided Pt Education - [facility] Colon Bowel Prep 2015 Miralax/Antibiotics Started Neomycin Sulfate '500MG'$ , 2 (two) Tablet SEE NOTE, #6, 01/19/2016, No Refill. Local Order: TAKE TWO TABLETS AT 2 PM, 3 PM, AND 10 PM THE DAY PRIOR TO SURGERY Started Flagyl '500MG'$ , 2 (two) Tablet SEE NOTE, #6, 01/19/2016, No Refill. Local Order: Take at 2pm, 3pm, and 10pm the day prior to your colon operation Pt Education - [facility]  Colectomy post-op instructions: discussed with patient and provided information. Pt Education - Pamphlet Given - Laparoscopic Colorectal Surgery: discussed with patient and provided information. Pt Education - [facility] Good Bowel Health (Roen Macgowan) Pt Education - [facility] Pain Control (Evanne Matsunaga) DIFFICULT AIRWAY, SUBSEQUENT ENCOUNTER (T88.4XXD) Impression: The patient had difficult airway on last case and 2012. Needed a glide scope. Let anesthesia beware.  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 No evidence of any severe dysphasia or other intractable symptoms that warrant surgical repair. Would leave it alone. She agrees  Kristin [name], M.D., F.A.C.S. Gastrointestinal and Minimally Invasive Surgery [facility] [address] [place], [place] [address] [phone] Main / Paging

## 2016-01-21 NOTE — Progress Notes (Signed)
Thanks for the note and taking care of her, I agree with you.  Richardson Landry

## 2016-01-22 ENCOUNTER — Other Ambulatory Visit: Payer: Medicare Other

## 2016-01-25 ENCOUNTER — Telehealth: Payer: Self-pay

## 2016-01-25 ENCOUNTER — Other Ambulatory Visit: Payer: Self-pay | Admitting: *Deleted

## 2016-01-25 NOTE — Telephone Encounter (Signed)
Patient calling wondering if she should keep her MD appt on the 10th and should a lab appt be added to that appt.?  Patient is requesting a call back from nurse.

## 2016-01-25 NOTE — Progress Notes (Signed)
Returned call to pt regarding labs pt prior to MD visit. Pt advised " I did not come to 4/3 lab appt because I did not think I needed any labs because I just had a CT scan and it's just too far to drive there just for labs." Discussed with pt we will r/s her lab appt for 4/10 prior to MD visit per pt request.

## 2016-01-29 ENCOUNTER — Other Ambulatory Visit (HOSPITAL_BASED_OUTPATIENT_CLINIC_OR_DEPARTMENT_OTHER): Payer: Medicare Other

## 2016-01-29 ENCOUNTER — Telehealth: Payer: Self-pay | Admitting: Internal Medicine

## 2016-01-29 ENCOUNTER — Encounter: Payer: Self-pay | Admitting: *Deleted

## 2016-01-29 ENCOUNTER — Encounter: Payer: Self-pay | Admitting: Internal Medicine

## 2016-01-29 ENCOUNTER — Ambulatory Visit (HOSPITAL_BASED_OUTPATIENT_CLINIC_OR_DEPARTMENT_OTHER): Payer: Medicare Other | Admitting: Internal Medicine

## 2016-01-29 VITALS — BP 146/57 | HR 67 | Temp 98.3°F | Resp 18 | Ht 66.0 in | Wt 135.1 lb

## 2016-01-29 DIAGNOSIS — C3412 Malignant neoplasm of upper lobe, left bronchus or lung: Secondary | ICD-10-CM

## 2016-01-29 DIAGNOSIS — M810 Age-related osteoporosis without current pathological fracture: Secondary | ICD-10-CM

## 2016-01-29 DIAGNOSIS — C182 Malignant neoplasm of ascending colon: Secondary | ICD-10-CM

## 2016-01-29 DIAGNOSIS — R195 Other fecal abnormalities: Secondary | ICD-10-CM

## 2016-01-29 DIAGNOSIS — Z853 Personal history of malignant neoplasm of breast: Secondary | ICD-10-CM | POA: Diagnosis not present

## 2016-01-29 HISTORY — DX: Malignant neoplasm of ascending colon: C18.2

## 2016-01-29 LAB — CBC WITH DIFFERENTIAL/PLATELET
BASO%: 1.3 % (ref 0.0–2.0)
BASOS ABS: 0.1 10*3/uL (ref 0.0–0.1)
EOS ABS: 0.3 10*3/uL (ref 0.0–0.5)
EOS%: 4.4 % (ref 0.0–7.0)
HEMATOCRIT: 28.8 % — AB (ref 34.8–46.6)
HEMOGLOBIN: 9.1 g/dL — AB (ref 11.6–15.9)
LYMPH#: 0.8 10*3/uL — AB (ref 0.9–3.3)
LYMPH%: 10.7 % — ABNORMAL LOW (ref 14.0–49.7)
MCH: 26.1 pg (ref 25.1–34.0)
MCHC: 31.4 g/dL — ABNORMAL LOW (ref 31.5–36.0)
MCV: 83.1 fL (ref 79.5–101.0)
MONO#: 0.8 10*3/uL (ref 0.1–0.9)
MONO%: 11.3 % (ref 0.0–14.0)
NEUT%: 72.3 % (ref 38.4–76.8)
NEUTROS ABS: 5.1 10*3/uL (ref 1.5–6.5)
Platelets: 562 10*3/uL — ABNORMAL HIGH (ref 145–400)
RBC: 3.47 10*6/uL — ABNORMAL LOW (ref 3.70–5.45)
RDW: 16.5 % — AB (ref 11.2–14.5)
WBC: 7.1 10*3/uL (ref 3.9–10.3)

## 2016-01-29 LAB — COMPREHENSIVE METABOLIC PANEL
ALBUMIN: 3.2 g/dL — AB (ref 3.5–5.0)
ALK PHOS: 94 U/L (ref 40–150)
ALT: 11 U/L (ref 0–55)
AST: 16 U/L (ref 5–34)
Anion Gap: 6 mEq/L (ref 3–11)
BILIRUBIN TOTAL: 0.46 mg/dL (ref 0.20–1.20)
BUN: 19 mg/dL (ref 7.0–26.0)
CALCIUM: 9.7 mg/dL (ref 8.4–10.4)
CO2: 25 mEq/L (ref 22–29)
Chloride: 107 mEq/L (ref 98–109)
Creatinine: 1 mg/dL (ref 0.6–1.1)
EGFR: 52 mL/min/{1.73_m2} — AB (ref >90)
Glucose: 101 mg/dl (ref 70–140)
POTASSIUM: 4.7 meq/L (ref 3.5–5.1)
Sodium: 139 mEq/L (ref 136–145)
TOTAL PROTEIN: 6.4 g/dL (ref 6.4–8.3)

## 2016-01-29 NOTE — Telephone Encounter (Signed)
Gave pt appt for may & avs

## 2016-01-29 NOTE — Progress Notes (Signed)
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  2) history of breast cancer.  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   Current therapy:  1) Tarceva at 150 mg by mouth daily status post 61 months therapy.  2) Femara 2.5 mg by mouth daily.   CHEMOTHERAPY INTENT: palliative  CURRENT # OF CHEMOTHERAPY CYCLES: 62 CURRENT ANTIEMETICS: Compazine when necessary  CURRENT SMOKING STATUS: Never smoker  ORAL CHEMOTHERAPY AND CONSENT: Tarceva and Femara.  CURRENT BISPHOSPHONATES USE: None  PAIN MANAGEMENT: None  NARCOTICS INDUCED CONSTIPATION: None  LIVING WILL AND CODE STATUS: Full code initially but no prolonged resuscitation.   INTERVAL HISTORY: Kristin [name] 75 y.o. female returns to the clinic today for two-month follow up visit.The patient is feeling fine today with no specific complaints except for mild fatigue. She was found recently to have ascending colon adenocarcinoma. She was referred to Dr. Johney Maine and expected to have colon surgery in the next few weeks. She is scheduled for 2-D echo tomorrow. The patient denied having any chest pain, shortness of breath, cough or hemoptysis. She denied having any rectal bleeding or black tarry stool. She is tolerating her treatment with Tarceva and Femara fairly well except for the skin rash. She denied having any significant nausea or vomiting, no fever or chills. She has no weight loss or night sweats.   MEDICAL HISTORY: Past Medical History  Diagnosis Date  . Hypertension   . Anemia   . Stroke ([place])   . Breast cancer ([place]) 2002    bilateral  . Lung cancer ([place]) dx'd 12/2010  . Tubular adenoma of colon 07/2011  . Blood transfusion  without reported diagnosis   . GERD (gastroesophageal reflux disease)     ALLERGIES:  is allergic to doxycycline and sulfa antibiotics.  MEDICATIONS:  Current Outpatient Prescriptions  Medication Sig Dispense Refill  . amLODipine (NORVASC) 5 MG tablet Take 5 mg by mouth daily.      . brimonidine-timolol (COMBIGAN) 0.2-0.5 % ophthalmic solution Place 1 drop into the right eye every 12 (twelve) hours.    . Calcium Carb-Cholecalciferol (RA CALCIUM 600/VITAMIN D-3) 600-400 MG-UNIT TABS Take 2 tablets by mouth daily.      . clindamycin (CLEOCIN T) 1 % lotion APPLY TWICE DAILY AS DIRECTED 60 mL 1  . clopidogrel (PLAVIX) 75 MG tablet Take 1 tablet (75 mg total) by mouth daily with breakfast. 90 tablet 3  . cycloSPORINE (RESTASIS) 0.05 % ophthalmic emulsion Place 1 drop into both eyes 2 (two) times daily.    Marland Kitchen erlotinib (TARCEVA) 150 MG tablet Take 1 tablet (150 mg total) by mouth daily. Take on an empty stomach 1 hour before meals or 2 hours after. 30 tablet 3  . escitalopram (LEXAPRO) 10 MG tablet Take 10 mg by mouth daily. Takes 51m and 19mtablet daily = 1546maily    . FeFum-FePoly-FA-B Cmp-C-Biot (FOLIVANE-PLUS) CAPS TAKE 1 CAPSULE BY MOUTH DAILY 90 capsule 1  . ibuprofen (ADVIL,MOTRIN) 200 MG tablet Take 200 mg by mouth as needed.    . lMarland Kitchentrozole (FEMARA) 2.5 MG tablet TAKE ONE TABLET EACH DAY 30 tablet 2  . lisinopril (PRINIVIL,ZESTRIL) 20 MG tablet     . zolpidem (AMBIEN) 5 MG tablet at  bedtime as needed.     . famotidine (PEPCID) 40 MG tablet Reported on 01/29/2016    . omeprazole (PRILOSEC) 20 MG capsule Take 1 capsule (20 mg total) by mouth daily. (Patient not taking: Reported on 01/29/2016) 60 capsule 3  . prochlorperazine (COMPAZINE) 10 MG tablet Take 1 tablet (10 mg total) by mouth every 6 (six) hours as needed for nausea or vomiting. (Patient not taking: Reported on 01/29/2016) 30 tablet 0  . [DISCONTINUED] erlotinib (TARCEVA) 150 MG tablet Take 1 tablet (150 mg total) by mouth daily.  30 tablet 2   No current facility-administered medications for this visit.    SURGICAL HISTORY:  Past Surgical History  Procedure Laterality Date  . Appendectomy    . Mastectomy Bilateral   . Lung surgery    . Cataracts Bilateral   . Colonoscopy    . Tonsillectomy    . Tee without cardioversion N/A 06/04/2013    Procedure: TRANSESOPHAGEAL ECHOCARDIOGRAM (TEE);  Surgeon: Jolaine Artist, MD;  Location: [facility] ENDOSCOPY;  Service: Cardiovascular;  Laterality: N/A;    REVIEW OF SYSTEMS:  Constitutional: positive for fatigue Eyes: negative Ears, nose, mouth, throat, and face: negative Respiratory: negative Cardiovascular: negative Gastrointestinal: negative Genitourinary:negative Integument/breast: negative Hematologic/lymphatic: negative Musculoskeletal:negative Neurological: negative Behavioral/Psych: negative Endocrine: negative Allergic/Immunologic: negative   PHYSICAL EXAMINATION: General appearance: alert, cooperative and no distress Head: Normocephalic, without obvious abnormality, atraumatic Neck: no adenopathy, no JVD and thyroid not enlarged, symmetric, no tenderness/mass/nodules Lymph nodes: Cervical, supraclavicular, and axillary nodes normal. Resp: clear to auscultation bilaterally and normal percussion bilaterally Back: symmetric, no curvature. ROM normal. No CVA tenderness. Cardio: regular rate and rhythm, S1, S2 normal, no murmur, click, rub or gallop GI: soft, non-tender; bowel sounds normal; no masses,  no organomegaly Extremities: extremities normal, atraumatic, no cyanosis or edema and Grade 2 skin rash on the lower extremities Neurologic: Alert and oriented X 3, normal strength and tone. Normal symmetric reflexes. Normal coordination and gait  ECOG PERFORMANCE STATUS: 1 - Symptomatic but completely ambulatory  Blood pressure 146/57, pulse 67, temperature 98.3 F (36.8 C), temperature source Oral, resp. rate 18, height _0  (1.676 m), weight 135 lb 1.6 oz  (61.281 kg), SpO2 100 %.  LABORATORY DATA: Lab Results  Component Value Date   WBC 7.1 01/29/2016   HGB 9.1* 01/29/2016   HCT 28.8* 01/29/2016   MCV 83.1 01/29/2016   PLT 562* 01/29/2016      Chemistry      Component Value Date/Time   NA 140 10/30/2015 1027   NA 136 05/31/2014 1058   NA 147* 03/26/2012 1022   K 4.8 10/30/2015 1027   K 4.1 05/31/2014 1058   K 4.8* 03/26/2012 1022   CL 104 05/31/2014 1058   CL 107 04/05/2013 0754   CL 102 03/26/2012 1022   CO2 25 10/30/2015 1027   CO2 26 05/31/2014 1058   CO2 31 03/26/2012 1022   BUN 16 01/11/2016 1245   BUN 21.5 10/30/2015 1027   BUN 14 03/26/2012 1022   CREATININE 0.89 01/11/2016 1245   CREATININE 0.9 10/30/2015 1027   CREATININE 0.7 03/26/2012 1022      Component Value Date/Time   CALCIUM 9.3 10/30/2015 1027   CALCIUM 9.2 05/31/2014 1058   CALCIUM 9.1 03/26/2012 1022   ALKPHOS 81 10/30/2015 1027   ALKPHOS 80 05/31/2014 1058   ALKPHOS 84 03/26/2012 1022   AST 17 10/30/2015 1027   AST 18 05/31/2014 1058   AST 26 03/26/2012 1022   ALT 14  10/30/2015 1027   ALT 13 05/31/2014 1058   ALT 20 03/26/2012 1022   BILITOT 0.46 10/30/2015 1027   BILITOT 0.4 05/31/2014 1058   BILITOT 0.70 03/26/2012 1022        RADIOGRAPHIC STUDIES: Ct Chest W Contrast  01/15/2016  CLINICAL DATA:  New diagnosis of colon cancer. History of breast cancer. EXAM: CT CHEST, ABDOMEN, AND PELVIS WITH CONTRAST TECHNIQUE: Multidetector CT imaging of the chest, abdomen and pelvis was performed following the standard protocol during bolus administration of intravenous contrast. CONTRAST:  158m ISOVUE-300 IOPAMIDOL (ISOVUE-300) INJECTION 61% COMPARISON:  08/24/2015 FINDINGS: CT CHEST Mediastinum: The heart size is normal. There is no pericardial effusion identified. Large hiatal hernia is noted. Aortic atherosclerosis is identified. No mediastinal or hilar adenopathy. No suspicious axillary or supraclavicular adenopathy. Lungs/Pleura: No pleural fluid  identified. Postoperative changes from wedge resection in the left upper lobe identified. No evidence for local tumor recurrence. No suspicious pulmonary nodules or masses identified. Musculoskeletal: Spondylosis is identified within the thoracic spine. No aggressive lytic or sclerotic bone lesions identified. CT ABDOMEN AND PELVIS Hepatobiliary: Cyst within the right lobe of liver is identified and appears unchanged measuring 2.6 cm. The gallbladder is normal. No biliary dilatation. Pancreas: Normal appearance of the pancreas. Spleen: The spleen appears normal. Adrenals/Urinary Tract: The adrenal glands are normal. Unremarkable appearance of both kidneys. The urinary bladder is within normal limits. Stomach/Bowel: The small bowel loops have a normal course and caliber. No pathologic dilatation of the small or large bowel loops. Soft tissue attenuating mass within the right colon is identified measuring approximately 4.6 cm, image number 19 of series 6. This corresponds with the mass identified on recent colonoscopy. There is a lymph node within the adjacent fat measuring 8 mm, image 90 of series 2. No pathologic dilatation of the large bowel loops. Vascular/Lymphatic: Aortic atherosclerosis noted. No aneurysm. No enlarged retroperitoneal or mesenteric adenopathy. No enlarged pelvic or inguinal lymph nodes. Reproductive: Calcified uterine fibroids noted.  No adnexal mass. Other: There is no ascites or focal fluid collections within the abdomen or pelvis. Musculoskeletal: No aggressive lytic or sclerotic bone lesions identified. IMPRESSION: 1. 4 cm mass is identified within the ascending colon with adjacent sub cm lymph node in the mesenteric fat. No specific findings to suggest distant metastatic disease. 2. Postoperative changes from left lung resection and bilateral mastectomies. No evidence for recurrent breast cancer or lung cancer. Electronically Signed   By: TKerby MoorsM.D.   On: 01/15/2016 10:34   Ct  Abdomen Pelvis W Contrast  01/15/2016  CLINICAL DATA:  New diagnosis of colon cancer. History of breast cancer. EXAM: CT CHEST, ABDOMEN, AND PELVIS WITH CONTRAST TECHNIQUE: Multidetector CT imaging of the chest, abdomen and pelvis was performed following the standard protocol during bolus administration of intravenous contrast. CONTRAST:  1019mISOVUE-300 IOPAMIDOL (ISOVUE-300) INJECTION 61% COMPARISON:  08/24/2015 FINDINGS: CT CHEST Mediastinum: The heart size is normal. There is no pericardial effusion identified. Large hiatal hernia is noted. Aortic atherosclerosis is identified. No mediastinal or hilar adenopathy. No suspicious axillary or supraclavicular adenopathy. Lungs/Pleura: No pleural fluid identified. Postoperative changes from wedge resection in the left upper lobe identified. No evidence for local tumor recurrence. No suspicious pulmonary nodules or masses identified. Musculoskeletal: Spondylosis is identified within the thoracic spine. No aggressive lytic or sclerotic bone lesions identified. CT ABDOMEN AND PELVIS Hepatobiliary: Cyst within the right lobe of liver is identified and appears unchanged measuring 2.6 cm. The gallbladder is normal. No biliary dilatation. Pancreas: Normal  appearance of the pancreas. Spleen: The spleen appears normal. Adrenals/Urinary Tract: The adrenal glands are normal. Unremarkable appearance of both kidneys. The urinary bladder is within normal limits. Stomach/Bowel: The small bowel loops have a normal course and caliber. No pathologic dilatation of the small or large bowel loops. Soft tissue attenuating mass within the right colon is identified measuring approximately 4.6 cm, image number 19 of series 6. This corresponds with the mass identified on recent colonoscopy. There is a lymph node within the adjacent fat measuring 8 mm, image 90 of series 2. No pathologic dilatation of the large bowel loops. Vascular/Lymphatic: Aortic atherosclerosis noted. No aneurysm. No  enlarged retroperitoneal or mesenteric adenopathy. No enlarged pelvic or inguinal lymph nodes. Reproductive: Calcified uterine fibroids noted.  No adnexal mass. Other: There is no ascites or focal fluid collections within the abdomen or pelvis. Musculoskeletal: No aggressive lytic or sclerotic bone lesions identified. IMPRESSION: 1. 4 cm mass is identified within the ascending colon with adjacent sub cm lymph node in the mesenteric fat. No specific findings to suggest distant metastatic disease. 2. Postoperative changes from left lung resection and bilateral mastectomies. No evidence for recurrent breast cancer or lung cancer. Electronically Signed   By: Kerby Moors M.D.   On: 01/15/2016 10:34    ASSESSMENT AND PLAN: this is a very pleasant 75 years old white female with history of metastatic non-small cell lung cancer, adenocarcinoma currently on treatment with Tarceva status post 61 months of treatment. The patient is feeling fine with no specific complaints and tolerating her treatment well. I recommended for the patient to continue her current treatment with Tarceva with the same dose.  For the history of the breast cancer, the patient will continue on Femara 2.5 mg by mouth daily. For the osteoporosis, the patient will continue on Prolia every 6 months. I recently diagnosed colon adenocarcinoma of the ascending colon. The patient is scheduled to undergo colon surgery soon by Dr. Johney Maine. I will see her after the surgery for evaluation and discussion of any adjuvant therapy if needed. The recent CT scan of the chest, abdomen and pelvis showed no evidence for metastatic disease. I also referred the patient to genetic counseling and also for her daughter [name] to be evaluated since the patient has 3 primary malignancy so far. She will come back for follow-up visit in 6 weeks for reevaluation with repeat blood work. The patient voices understanding of current disease status and treatment options and is  in agreement with the current care plan.  All questions were answered. The patient knows to call the clinic with any problems, questions or concerns. We can certainly see the patient much sooner if necessary.  Disclaimer: This note was dictated with voice recognition software. Similar sounding words can inadvertently be transcribed and may not be corrected upon review.

## 2016-01-30 ENCOUNTER — Encounter: Payer: Self-pay | Admitting: Cardiovascular Disease

## 2016-01-30 ENCOUNTER — Ambulatory Visit (INDEPENDENT_AMBULATORY_CARE_PROVIDER_SITE_OTHER): Payer: Medicare Other | Admitting: Cardiovascular Disease

## 2016-01-30 VITALS — BP 132/60 | HR 66 | Ht 66.0 in | Wt 136.0 lb

## 2016-01-30 DIAGNOSIS — Z01818 Encounter for other preprocedural examination: Secondary | ICD-10-CM | POA: Diagnosis not present

## 2016-01-30 DIAGNOSIS — I10 Essential (primary) hypertension: Secondary | ICD-10-CM | POA: Diagnosis not present

## 2016-01-30 DIAGNOSIS — R0602 Shortness of breath: Secondary | ICD-10-CM

## 2016-01-30 NOTE — Progress Notes (Signed)
Patient ID: Kristin [name], female   DOB: 19-May-1941, 75 y.o.   MRN: [id number]       CARDIOLOGY CONSULT NOTE  Patient ID: Kristin [name] MRN: [id number] DOB/AGE: 06/19/1941 75 y.o.  Admit date: (Not on file) Primary Physician EASON,PAUL, MD  Reason for Consultation: preop clearance  HPI: The patient is a 75 year old woman with a history of breast cancer, metastatic non-small cell adenocarcinoma of the lung, was recently diagnosed with adenocarcinoma of the ascending colon and requires resection. She presents for preoperative risk stratification. TEE on 06/04/2013 demonstrated normal left ventricular systolic function. She has a history of a CVA.  ECG performed in the office today demonstrates normal sinus rhythm with no ischemic ST segment or T-wave abnormalities, nor any arrhythmias.  She has no known coronary artery disease. She denies palpitations, syncope, leg swelling, chest pain, and dizziness. She has never had radiation therapy to the chest. She does not get much activity as her medications cause her to feel weak and she has hip arthritis. She has some mild shortness of breath when climbing stairs but is able to do so.    Allergies  Allergen Reactions  . Doxycycline Nausea Only  . Sulfa Antibiotics Rash    Current Outpatient Prescriptions  Medication Sig Dispense Refill  . amLODipine (NORVASC) 5 MG tablet Take 5 mg by mouth daily.      . brimonidine-timolol (COMBIGAN) 0.2-0.5 % ophthalmic solution Place 1 drop into the right eye every 12 (twelve) hours.    . Calcium Carb-Cholecalciferol (RA CALCIUM 600/VITAMIN D-3) 600-400 MG-UNIT TABS Take 2 tablets by mouth daily.      . clindamycin (CLEOCIN T) 1 % lotion APPLY TWICE DAILY AS DIRECTED 60 mL 1  . clopidogrel (PLAVIX) 75 MG tablet Take 1 tablet (75 mg total) by mouth daily with breakfast. 90 tablet 3  . cycloSPORINE (RESTASIS) 0.05 % ophthalmic emulsion Place 1 drop into both eyes 2 (two) times daily.    Marland Kitchen erlotinib  (TARCEVA) 150 MG tablet Take 1 tablet (150 mg total) by mouth daily. Take on an empty stomach 1 hour before meals or 2 hours after. 30 tablet 3  . escitalopram (LEXAPRO) 10 MG tablet Take 10 mg by mouth daily. Takes '5mg'$  and '10mg'$  tablet daily = '15mg'$  daily    . famotidine (PEPCID) 40 MG tablet Reported on 01/29/2016    . FeFum-FePoly-FA-B Cmp-C-Biot (FOLIVANE-PLUS) CAPS TAKE 1 CAPSULE BY MOUTH DAILY 90 capsule 1  . ibuprofen (ADVIL,MOTRIN) 200 MG tablet Take 200 mg by mouth as needed.    Marland Kitchen letrozole (FEMARA) 2.5 MG tablet TAKE ONE TABLET EACH DAY 30 tablet 2  . lisinopril (PRINIVIL,ZESTRIL) 20 MG tablet Take 20 mg by mouth daily.     Marland Kitchen omeprazole (PRILOSEC) 20 MG capsule Take 1 capsule (20 mg total) by mouth daily. 60 capsule 3  . prochlorperazine (COMPAZINE) 10 MG tablet Take 1 tablet (10 mg total) by mouth every 6 (six) hours as needed for nausea or vomiting. 30 tablet 0  . zolpidem (AMBIEN) 5 MG tablet at bedtime as needed.     . [DISCONTINUED] erlotinib (TARCEVA) 150 MG tablet Take 1 tablet (150 mg total) by mouth daily. 30 tablet 2   No current facility-administered medications for this visit.    Past Medical History  Diagnosis Date  . Hypertension   . Anemia   . Stroke ([place])   . Breast cancer ([place]) 2002    bilateral  . Lung cancer ([place]) dx'd 12/2010  . Tubular adenoma of colon  07/2011  . Blood transfusion without reported diagnosis   . GERD (gastroesophageal reflux disease)   . Malignant neoplasm of ascending colon ([place]) 01/29/2016    Past Surgical History  Procedure Laterality Date  . Appendectomy    . Mastectomy Bilateral   . Lung surgery    . Cataracts Bilateral   . Colonoscopy    . Tonsillectomy    . Tee without cardioversion N/A 06/04/2013    Procedure: TRANSESOPHAGEAL ECHOCARDIOGRAM (TEE);  Surgeon: Jolaine Artist, MD;  Location: [facility] ENDOSCOPY;  Service: Cardiovascular;  Laterality: N/A;    Social History   Social History  . Marital Status: Married    Spouse  Name: N/A  . Number of Children: 2  . Years of Education: college   Occupational History  . Retired    Social History Main Topics  . Smoking status: Never Smoker   . Smokeless tobacco: Never Used  . Alcohol Use: 0.0 oz/week    0 Standard drinks or equivalent per week     Comment: ocassional beer  . Drug Use: No  . Sexual Activity: Not on file   Other Topics Concern  . Not on file   Social History Narrative   Patient lives at home with her husband. Patient drinks caffinated  Drinks daily.     No family history of premature CAD in 1st degree relatives.  Prior to Admission medications   Medication Sig Start Date End Date Taking? Authorizing Provider  amLODipine (NORVASC) 5 MG tablet Take 5 mg by mouth daily.     Yes Historical Provider, MD  brimonidine-timolol (COMBIGAN) 0.2-0.5 % ophthalmic solution Place 1 drop into the right eye every 12 (twelve) hours.   Yes Historical Provider, MD  Calcium Carb-Cholecalciferol (RA CALCIUM 600/VITAMIN D-3) 600-400 MG-UNIT TABS Take 2 tablets by mouth daily.     Yes Historical Provider, MD  clindamycin (CLEOCIN T) 1 % lotion APPLY TWICE DAILY AS DIRECTED 04/14/14  Yes Adrena E Johnson, PA-C  clopidogrel (PLAVIX) 75 MG tablet Take 1 tablet (75 mg total) by mouth daily with breakfast. 01/19/14  Yes Garvin Fila, MD  cycloSPORINE (RESTASIS) 0.05 % ophthalmic emulsion Place 1 drop into both eyes 2 (two) times daily.   Yes Historical Provider, MD  erlotinib (TARCEVA) 150 MG tablet Take 1 tablet (150 mg total) by mouth daily. Take on an empty stomach 1 hour before meals or 2 hours after. 10/12/15  Yes Curt Bears, MD  escitalopram (LEXAPRO) 10 MG tablet Take 10 mg by mouth daily. Takes '5mg'$  and '10mg'$  tablet daily = '15mg'$  daily 05/20/13  Yes Philmore Pali, NP  famotidine (PEPCID) 40 MG tablet Reported on 01/29/2016 10/25/15  Yes Historical Provider, MD  FeFum-FePoly-FA-B Cmp-C-Biot (FOLIVANE-PLUS) CAPS TAKE 1 CAPSULE BY MOUTH DAILY 05/28/13  Yes Curt Bears,  MD  ibuprofen (ADVIL,MOTRIN) 200 MG tablet Take 200 mg by mouth as needed.   Yes Historical Provider, MD  letrozole [facility]) 2.5 MG tablet TAKE ONE TABLET EACH DAY 11/12/15  Yes Curt Bears, MD  lisinopril (PRINIVIL,ZESTRIL) 20 MG tablet Take 20 mg by mouth daily.  01/31/15  Yes Historical Provider, MD  omeprazole (PRILOSEC) 20 MG capsule Take 1 capsule (20 mg total) by mouth daily. 01/01/16  Yes Manus Gunning, MD  prochlorperazine (COMPAZINE) 10 MG tablet Take 1 tablet (10 mg total) by mouth every 6 (six) hours as needed for nausea or vomiting. 09/19/14  Yes Curt Bears, MD  zolpidem (AMBIEN) 5 MG tablet at bedtime as needed.  02/18/12  Yes Historical Provider, MD     Review of systems complete and found to be negative unless listed above in HPI     Physical exam Blood pressure 132/60, pulse 66, height '5\' 6"'$  (1.676 m), weight 136 lb (61.689 kg), SpO2 98 %. General: NAD Neck: No JVD, no thyromegaly or thyroid nodule.  Lungs: Clear to auscultation bilaterally with normal respiratory effort. CV: Nondisplaced PMI. Regular rate and rhythm, normal S1/S2, no S3/S4, no murmur.  No peripheral edema.  No carotid bruit.  Normal pedal pulses.  Abdomen: Soft, nontender, no hepatosplenomegaly, no distention.  Skin: Intact without lesions or rashes.  Neurologic: Alert and oriented x 3.  Psych: Normal affect. Extremities: No clubbing or cyanosis.  HEENT: Normal.   ECG: Most recent ECG reviewed.  Labs:   Lab Results  Component Value Date   WBC 7.1 01/29/2016   HGB 9.1* 01/29/2016   HCT 28.8* 01/29/2016   MCV 83.1 01/29/2016   PLT 562* 01/29/2016    Recent Labs Lab 01/29/16 1013  NA 139  K 4.7  CO2 25  BUN 19.0  CREATININE 1.0  CALCIUM 9.7  PROT 6.4  BILITOT 0.46  ALKPHOS 94  ALT 11  AST 16  GLUCOSE 101   No results found for: CKTOTAL, CKMB, CKMBINDEX, TROPONINI Lab Results  Component Value Date   CHOL 202* 04/30/2013   Lab Results  Component Value Date   HDL  93 04/30/2013   Lab Results  Component Value Date   LDLCALC 97 04/30/2013   Lab Results  Component Value Date   TRIG 62 04/30/2013   Lab Results  Component Value Date   CHOLHDL 2.2 04/30/2013   No results found for: LDLDIRECT       Studies: No results found.  ASSESSMENT AND PLAN:  1. Preoperative risk stratification: No known CAD. ECG is normal. Able to climb a flight of stairs with only mild shortness of breath. Has not had any radiation therapy to the chest. I do not feel stress testing is warranted. I will order a 2-D echocardiogram with Doppler to evaluate cardiac structure, function, and regional wall motion. She is likely at a low perioperative risk for a major adverse cardiac event and can safely proceed.  2. Essential HTN: Controlled. No changes.  Dispo: fu to be determined.   Signed: Kate Sable, M.D., F.A.C.C.  01/30/2016, 2:42 PM

## 2016-01-30 NOTE — Patient Instructions (Signed)
Your physician has requested that you have an echocardiogram. Echocardiography is a painless test that uses sound waves to create images of your heart. It provides your doctor with information about the size and shape of your heart and how well your heart's chambers and valves are working. This procedure takes approximately one hour. There are no restrictions for this procedure. Office will contact with results via phone or letter.   Continue all current medications. Follow up to be based on test results.

## 2016-02-01 ENCOUNTER — Ambulatory Visit (INDEPENDENT_AMBULATORY_CARE_PROVIDER_SITE_OTHER): Payer: Medicare Other

## 2016-02-01 ENCOUNTER — Other Ambulatory Visit: Payer: Self-pay

## 2016-02-01 DIAGNOSIS — Z01818 Encounter for other preprocedural examination: Secondary | ICD-10-CM | POA: Diagnosis not present

## 2016-02-01 DIAGNOSIS — R0602 Shortness of breath: Secondary | ICD-10-CM | POA: Diagnosis not present

## 2016-02-02 ENCOUNTER — Telehealth: Payer: Self-pay | Admitting: *Deleted

## 2016-02-02 NOTE — Telephone Encounter (Signed)
Called patient with test results. No answer. Left message to call back.  

## 2016-02-02 NOTE — Telephone Encounter (Signed)
-----   Message from Herminio Commons, MD sent at 02/02/2016  8:27 AM EDT ----- Normal pumping function.

## 2016-02-12 ENCOUNTER — Other Ambulatory Visit: Payer: Self-pay | Admitting: Medical Oncology

## 2016-02-12 DIAGNOSIS — C3412 Malignant neoplasm of upper lobe, left bronchus or lung: Secondary | ICD-10-CM

## 2016-02-12 MED ORDER — ERLOTINIB HCL 150 MG PO TABS: 150.0000 mg | ORAL_TABLET | Freq: Every day | ORAL | Status: DC

## 2016-02-19 ENCOUNTER — Other Ambulatory Visit: Payer: Self-pay | Admitting: Internal Medicine

## 2016-02-22 ENCOUNTER — Telehealth: Payer: Self-pay | Admitting: *Deleted

## 2016-02-22 ENCOUNTER — Encounter: Payer: Self-pay | Admitting: Internal Medicine

## 2016-02-22 NOTE — Telephone Encounter (Signed)
Pt called [facility] " I need a refill on tarceva and the pharmacy said they dont have the medicine"  [place] advised the pt was referred to the manufacturer due to the health grant pt had previously received has expired. Manufacturer may be able to assist pt with copay.    Notified Montel Clock, pharmacist in charge of oral chemo.  Called pt advised per Gerald Stabs we have a months supply of Tarceva here that she may have until we can contact the manufacturer.  Pt states " I will pick it up tomorrow I have to come there anyway for an appt." No further concerns.

## 2016-02-22 NOTE — Progress Notes (Signed)
I sent [employer] app to patient for poss asst with tarceva. I left dr portion for dr to sign--from

## 2016-02-23 ENCOUNTER — Encounter (HOSPITAL_COMMUNITY): Payer: Self-pay

## 2016-02-23 ENCOUNTER — Encounter (HOSPITAL_COMMUNITY)
Admission: RE | Admit: 2016-02-23 | Discharge: 2016-02-23 | Disposition: A | Discharge status: Home / Self Care | Payer: Medicare Other | Source: Ambulatory Visit | Attending: Surgery | Admitting: Surgery

## 2016-02-23 DIAGNOSIS — M810 Age-related osteoporosis without current pathological fracture: Secondary | ICD-10-CM | POA: Diagnosis not present

## 2016-02-23 DIAGNOSIS — I10 Essential (primary) hypertension: Secondary | ICD-10-CM | POA: Diagnosis not present

## 2016-02-23 DIAGNOSIS — K219 Gastro-esophageal reflux disease without esophagitis: Secondary | ICD-10-CM | POA: Diagnosis not present

## 2016-02-23 DIAGNOSIS — Z01812 Encounter for preprocedural laboratory examination: Secondary | ICD-10-CM | POA: Diagnosis not present

## 2016-02-23 DIAGNOSIS — D649 Anemia, unspecified: Secondary | ICD-10-CM | POA: Insufficient documentation

## 2016-02-23 DIAGNOSIS — F419 Anxiety disorder, unspecified: Secondary | ICD-10-CM | POA: Insufficient documentation

## 2016-02-23 DIAGNOSIS — Z85118 Personal history of other malignant neoplasm of bronchus and lung: Secondary | ICD-10-CM | POA: Diagnosis not present

## 2016-02-23 DIAGNOSIS — Z853 Personal history of malignant neoplasm of breast: Secondary | ICD-10-CM | POA: Diagnosis not present

## 2016-02-23 HISTORY — DX: Anxiety disorder, unspecified: F41.9

## 2016-02-23 HISTORY — DX: Unspecified osteoarthritis, unspecified site: M19.90

## 2016-02-23 LAB — CBC
HCT: 28.9 % — ABNORMAL LOW (ref 36.0–46.0)
Hemoglobin: 9 g/dL — ABNORMAL LOW (ref 12.0–15.0)
MCH: 25.9 pg — ABNORMAL LOW (ref 26.0–34.0)
MCHC: 31.1 g/dL (ref 30.0–36.0)
MCV: 83 fL (ref 78.0–100.0)
PLATELETS: 687 10*3/uL — AB (ref 150–400)
RBC: 3.48 MIL/uL — AB (ref 3.87–5.11)
RDW: 16.2 % — AB (ref 11.5–15.5)
WBC: 6.2 10*3/uL (ref 4.0–10.5)

## 2016-02-23 LAB — BASIC METABOLIC PANEL
ANION GAP: 7 (ref 5–15)
BUN: 23 mg/dL — ABNORMAL HIGH (ref 6–20)
CALCIUM: 9.6 mg/dL (ref 8.9–10.3)
CO2: 26 mmol/L (ref 22–32)
Chloride: 108 mmol/L (ref 101–111)
Creatinine, Ser: 0.83 mg/dL (ref 0.44–1.00)
Glucose, Bld: 116 mg/dL — ABNORMAL HIGH (ref 65–99)
POTASSIUM: 5 mmol/L (ref 3.5–5.1)
Sodium: 141 mmol/L (ref 135–145)

## 2016-02-23 LAB — TYPE AND SCREEN
ABO/RH(D): B POS
ANTIBODY SCREEN: NEGATIVE

## 2016-02-23 NOTE — Patient Instructions (Addendum)
Kristin [name]  02/23/2016   Your procedure is scheduled on: 03-06-16   Report to [facility] Main  Entrance take [facility]  elevators to 3rd floor to  [place] at 0830  AM.  Call this number if you have problems the morning of surgery [phone]   Remember: ONLY 1 PERSON MAY GO WITH YOU TO SHORT STAY TO GET  READY MORNING OF [place].  Do not eat food or drink liquids :After Midnight.  Bowel prep per office instructions( be mindful to drink plenty of Clear Liquids 24 hour priorday before to surgery.).   Take these medicines the morning of surgery with A SIP OF WATER: Norvasc. Lexapro. Pepcid .Eye drops- usual/bring.Plavix use per MD instructions.  DO NOT TAKE ANY DIABETIC MEDICATIONS DAY OF YOUR SURGERY                               You may not have any metal on your body including hair pins and              piercings  Do not wear jewelry, make-up, lotions, powders or perfumes, deodorant             Do not wear nail polish.  Do not shave  48 hours prior to surgery.              Men may shave face and neck.   Do not bring valuables to the hospital. [place].  Contacts, dentures or bridgework may not be worn into surgery.  Leave suitcase in the car. After surgery it may be brought to your room.     Patients discharged the day of surgery will not be allowed to drive home.  Name and phone number of your driver:Daughter -Wendall Mola -[phone]   Special Instructions: N/A              Please read over the following fact sheets you were given: _____________________________________________________________________             [facility] - Preparing for Surgery Before surgery, you can play an important role.  Because skin is not sterile, your skin needs to be as free of germs as possible.  You can reduce the number of germs on your skin by washing with CHG (chlorahexidine gluconate) soap before  surgery.  CHG is an antiseptic cleaner which kills germs and bonds with the skin to continue killing germs even after washing. Please DO NOT use if you have an allergy to CHG or antibacterial soaps.  If your skin becomes reddened/irritated stop using the CHG and inform your nurse when you arrive at Short Stay. Do not shave (including legs and underarms) for at least 48 hours prior to the first CHG shower.  You may shave your face/neck. Please follow these instructions carefully:  1.  Shower with CHG Soap the night before surgery and the  morning of Surgery.  2.  If you choose to wash your hair, wash your hair first as usual with your  normal  shampoo.  3.  After you shampoo, rinse your hair and body thoroughly to remove the  shampoo.  4.  Use CHG as you would any other liquid soap.  You can apply chg directly  to the skin and wash                       Gently with a scrungie or clean washcloth.  5.  Apply the CHG Soap to your body ONLY FROM THE NECK DOWN.   Do not use on face/ open                           Wound or open sores. Avoid contact with eyes, ears mouth and genitals (private parts).                       Wash face,  Genitals (private parts) with your normal soap.             6.  Wash thoroughly, paying special attention to the area where your surgery  will be performed.  7.  Thoroughly rinse your body with warm water from the neck down.  8.  DO NOT shower/wash with your normal soap after using and rinsing off  the CHG Soap.                9.  Pat yourself dry with a clean towel.            10.  Wear clean pajamas.            11.  Place clean sheets on your bed the night of your first shower and do not  sleep with pets. Day of Surgery : Do not apply any lotions/deodorants the morning of surgery.  Please wear clean clothes to the hospital/surgery center.  FAILURE TO FOLLOW THESE INSTRUCTIONS MAY RESULT IN THE CANCELLATION OF YOUR SURGERY PATIENT  SIGNATURE_________________________________  NURSE SIGNATURE__________________________________  ________________________________________________________________________

## 2016-02-24 LAB — CEA: CEA: 75.6 ng/mL — ABNORMAL HIGH (ref 0.0–4.7)

## 2016-02-24 LAB — HEMOGLOBIN A1C
Hgb A1c MFr Bld: 5.1 % (ref 4.8–5.6)
Mean Plasma Glucose: 100 mg/dL

## 2016-02-26 NOTE — Progress Notes (Signed)
02-26-16 0910 labs viewable in Epic. Note CBC. Pt  Aware to bring own supply of Tarceva(Pharmacy unable to supply).

## 2016-02-26 NOTE — Pre-Procedure Instructions (Signed)
02-26-16 0910 Pt instructed to bring own supply Tarvceva ( pharmacy unable to supply).

## 2016-02-29 ENCOUNTER — Telehealth: Payer: Self-pay | Admitting: Pharmacist

## 2016-02-29 NOTE — Telephone Encounter (Signed)
02/28/16: spoke with Ms. Crooker over the phone. She received sample of Tarceva. She also has [employer] application for tarceva assistance. She will sign and return to [place] at [facility]. Hopefully she will be approved for assistance with [employer] within 1-2 weeks of faxing in application  Thank you,  Montel Clock, PharmD, [facility]

## 2016-03-04 ENCOUNTER — Telehealth: Payer: Self-pay | Admitting: Internal Medicine

## 2016-03-04 NOTE — Telephone Encounter (Signed)
returned call and s.w. pt and cx all appts per pt request....pt pt will call us back to r/s after surgery

## 2016-03-05 MED ORDER — SODIUM CHLORIDE 0.9 % IV SOLN
INTRAVENOUS | Status: DC
  Filled 2016-03-05: qty 6

## 2016-03-05 NOTE — Anesthesia Preprocedure Evaluation (Addendum)
Anesthesia Evaluation  Patient identified by MRN, date of birth, ID band Patient awake    Reviewed: Allergy & Precautions, H&P , NPO status , Patient's Chart, lab work & pertinent test results  Airway Mallampati: II  TM Distance: >3 FB Neck ROM: Full    Dental no notable dental hx. (+) Teeth Intact, Dental Advisory Given   Pulmonary neg pulmonary ROS,  H/o lung CA   Pulmonary exam normal breath sounds clear to auscultation       Cardiovascular hypertension, Pt. on medications  Rhythm:Regular Rate:Normal     Neuro/Psych Anxiety CVA negative psych ROS   GI/Hepatic Neg liver ROS, GERD  Medicated and Controlled,Colon CA   Endo/Other  negative endocrine ROS  Renal/GU negative Renal ROS  negative genitourinary   Musculoskeletal  (+) Arthritis , Osteoarthritis,    Abdominal   Peds  Hematology negative hematology ROS (+) anemia ,   Anesthesia Other Findings   Reproductive/Obstetrics negative OB ROS                            Anesthesia Physical Anesthesia Plan  ASA: III  Anesthesia Plan: General   Post-op Pain Management:    Induction: Intravenous  Airway Management Planned: Oral ETT  Additional Equipment:   Intra-op Plan:   Post-operative Plan: Extubation in OR  Informed Consent: I have reviewed the patients History and Physical, chart, labs and discussed the procedure including the risks, benefits and alternatives for the proposed anesthesia with the patient or authorized representative who has indicated his/her understanding and acceptance.   Dental advisory given  Plan Discussed with: CRNA  Anesthesia Plan Comments:         Anesthesia Quick Evaluation

## 2016-03-06 ENCOUNTER — Encounter (HOSPITAL_COMMUNITY): Payer: Self-pay | Admitting: *Deleted

## 2016-03-06 ENCOUNTER — Inpatient Hospital Stay (HOSPITAL_COMMUNITY): Payer: Medicare Other | Admitting: Anesthesiology

## 2016-03-06 ENCOUNTER — Encounter (HOSPITAL_COMMUNITY)
Admission: RE | Disposition: A | Discharge status: Home / Self Care | Payer: Self-pay | Source: Ambulatory Visit | Attending: Surgery

## 2016-03-06 ENCOUNTER — Inpatient Hospital Stay (HOSPITAL_COMMUNITY)
Admission: RE | Admit: 2016-03-06 | Discharge: 2016-03-08 | DRG: 331 | Disposition: A | Discharge status: Home / Self Care | Payer: Medicare Other | Source: Ambulatory Visit | Attending: Surgery | Admitting: Surgery

## 2016-03-06 DIAGNOSIS — L821 Other seborrheic keratosis: Secondary | ICD-10-CM | POA: Diagnosis present

## 2016-03-06 DIAGNOSIS — D225 Melanocytic nevi of trunk: Secondary | ICD-10-CM

## 2016-03-06 DIAGNOSIS — Z8673 Personal history of transient ischemic attack (TIA), and cerebral infarction without residual deficits: Secondary | ICD-10-CM | POA: Diagnosis not present

## 2016-03-06 DIAGNOSIS — M810 Age-related osteoporosis without current pathological fracture: Secondary | ICD-10-CM | POA: Diagnosis present

## 2016-03-06 DIAGNOSIS — K449 Diaphragmatic hernia without obstruction or gangrene: Secondary | ICD-10-CM | POA: Diagnosis present

## 2016-03-06 DIAGNOSIS — Z8249 Family history of ischemic heart disease and other diseases of the circulatory system: Secondary | ICD-10-CM | POA: Diagnosis not present

## 2016-03-06 DIAGNOSIS — L570 Actinic keratosis: Secondary | ICD-10-CM | POA: Diagnosis present

## 2016-03-06 DIAGNOSIS — C182 Malignant neoplasm of ascending colon: Secondary | ICD-10-CM | POA: Diagnosis present

## 2016-03-06 DIAGNOSIS — Z800 Family history of malignant neoplasm of digestive organs: Secondary | ICD-10-CM | POA: Diagnosis not present

## 2016-03-06 DIAGNOSIS — Z9049 Acquired absence of other specified parts of digestive tract: Secondary | ICD-10-CM | POA: Insufficient documentation

## 2016-03-06 DIAGNOSIS — I10 Essential (primary) hypertension: Secondary | ICD-10-CM | POA: Diagnosis present

## 2016-03-06 DIAGNOSIS — E7800 Pure hypercholesterolemia, unspecified: Secondary | ICD-10-CM | POA: Diagnosis present

## 2016-03-06 DIAGNOSIS — Z853 Personal history of malignant neoplasm of breast: Secondary | ICD-10-CM

## 2016-03-06 DIAGNOSIS — K219 Gastro-esophageal reflux disease without esophagitis: Secondary | ICD-10-CM | POA: Diagnosis present

## 2016-03-06 DIAGNOSIS — K660 Peritoneal adhesions (postprocedural) (postinfection): Secondary | ICD-10-CM | POA: Diagnosis present

## 2016-03-06 DIAGNOSIS — Z803 Family history of malignant neoplasm of breast: Secondary | ICD-10-CM | POA: Diagnosis not present

## 2016-03-06 DIAGNOSIS — Z85118 Personal history of other malignant neoplasm of bronchus and lung: Secondary | ICD-10-CM

## 2016-03-06 DIAGNOSIS — Z9013 Acquired absence of bilateral breasts and nipples: Secondary | ICD-10-CM | POA: Diagnosis not present

## 2016-03-06 HISTORY — DX: Family history of malignant neoplasm of digestive organs: Z80.0

## 2016-03-06 HISTORY — DX: Major depressive disorder, single episode, unspecified: F32.9

## 2016-03-06 HISTORY — DX: Acquired absence of bilateral breasts and nipples: Z90.13

## 2016-03-06 HISTORY — PX: OTHER SURGICAL HISTORY: SHX169

## 2016-03-06 HISTORY — DX: Cerebral infarction, unspecified: I63.9

## 2016-03-06 LAB — GLUCOSE, CAPILLARY: GLUCOSE-CAPILLARY: 132 mg/dL — AB (ref 65–99)

## 2016-03-06 SURGERY — COLECTOMY, PARTIAL, ROBOT-ASSISTED, LAPAROSCOPIC
Anesthesia: General | Site: Abdomen

## 2016-03-06 MED ORDER — DEXTROSE 5 % IV SOLN
2.0000 g | INTRAVENOUS | Status: AC
  Administered 2016-03-06: 2 g via INTRAVENOUS
  Filled 2016-03-06: qty 2

## 2016-03-06 MED ORDER — PROPOFOL 10 MG/ML IV BOLUS
INTRAVENOUS | Status: AC
  Filled 2016-03-06: qty 20

## 2016-03-06 MED ORDER — ONDANSETRON HCL 4 MG/2ML IJ SOLN
INTRAMUSCULAR | Status: AC
  Filled 2016-03-06: qty 4

## 2016-03-06 MED ORDER — LACTATED RINGERS IV SOLN
INTRAVENOUS | Status: DC
  Administered 2016-03-07: 50 mL/h via INTRAVENOUS

## 2016-03-06 MED ORDER — CALCIUM CARBONATE-VITAMIN D 500-200 MG-UNIT PO TABS
2.0000 | ORAL_TABLET | Freq: Every day | ORAL | Status: DC
Start: 2016-03-07 — End: 2016-03-08
  Administered 2016-03-07 – 2016-03-08 (×2): 2 via ORAL
  Filled 2016-03-06 (×2): qty 2

## 2016-03-06 MED ORDER — BUPIVACAINE-EPINEPHRINE 0.25% -1:200000 IJ SOLN
INTRAMUSCULAR | Status: DC | PRN
  Administered 2016-03-06: 50 mL

## 2016-03-06 MED ORDER — HYDROMORPHONE HCL 1 MG/ML IJ SOLN
0.5000 mg | INTRAMUSCULAR | Status: DC | PRN
  Administered 2016-03-07: 1 mg via INTRAVENOUS
  Filled 2016-03-06 (×2): qty 1

## 2016-03-06 MED ORDER — ENOXAPARIN SODIUM 40 MG/0.4ML ~~LOC~~ SOLN
40.0000 mg | Freq: Once | SUBCUTANEOUS | Status: AC
  Administered 2016-03-06: 40 mg via SUBCUTANEOUS
  Filled 2016-03-06: qty 0.4

## 2016-03-06 MED ORDER — LETROZOLE 2.5 MG PO TABS
2.5000 mg | ORAL_TABLET | Freq: Every day | ORAL | Status: DC
  Administered 2016-03-07 – 2016-03-08 (×2): 2.5 mg via ORAL
  Filled 2016-03-06 (×3): qty 1

## 2016-03-06 MED ORDER — SUGAMMADEX SODIUM 200 MG/2ML IV SOLN
INTRAVENOUS | Status: DC | PRN
  Administered 2016-03-06: 125 mg via INTRAVENOUS

## 2016-03-06 MED ORDER — METOPROLOL TARTRATE 5 MG/5ML IV SOLN: 5.0000 mg | Freq: Four times a day (QID) | INTRAVENOUS | Status: DC | PRN

## 2016-03-06 MED ORDER — OXYCODONE HCL 5 MG PO TABS: 5.0000 mg | ORAL_TABLET | Freq: Four times a day (QID) | ORAL | Status: DC | PRN

## 2016-03-06 MED ORDER — CEFOTETAN DISODIUM-DEXTROSE 2-2.08 GM-% IV SOLR
INTRAVENOUS | Status: AC
  Filled 2016-03-06: qty 50

## 2016-03-06 MED ORDER — BRIMONIDINE TARTRATE 0.2 % OP SOLN
1.0000 [drp] | Freq: Two times a day (BID) | OPHTHALMIC | Status: DC
  Administered 2016-03-06 – 2016-03-08 (×4): 1 [drp] via OPHTHALMIC
  Filled 2016-03-06: qty 5

## 2016-03-06 MED ORDER — PHENOL 1.4 % MT LIQD: 2.0000 | OROMUCOSAL | Status: DC | PRN

## 2016-03-06 MED ORDER — ENOXAPARIN SODIUM 40 MG/0.4ML ~~LOC~~ SOLN
40.0000 mg | SUBCUTANEOUS | Status: DC
  Administered 2016-03-07 – 2016-03-08 (×2): 40 mg via SUBCUTANEOUS
  Filled 2016-03-06 (×3): qty 0.4

## 2016-03-06 MED ORDER — SACCHAROMYCES BOULARDII 250 MG PO CAPS
250.0000 mg | ORAL_CAPSULE | Freq: Two times a day (BID) | ORAL | Status: DC
Start: 2016-03-06 — End: 2016-03-08
  Administered 2016-03-06 – 2016-03-08 (×4): 250 mg via ORAL
  Filled 2016-03-06 (×5): qty 1

## 2016-03-06 MED ORDER — ACETAMINOPHEN 500 MG PO TABS
1000.0000 mg | ORAL_TABLET | Freq: Three times a day (TID) | ORAL | Status: DC
  Administered 2016-03-06 – 2016-03-08 (×6): 1000 mg via ORAL
  Filled 2016-03-06 (×9): qty 2

## 2016-03-06 MED ORDER — BUPIVACAINE LIPOSOME 1.3 % IJ SUSP
20.0000 mL | INTRAMUSCULAR | Status: DC
  Filled 2016-03-06: qty 20

## 2016-03-06 MED ORDER — ONDANSETRON HCL 4 MG/2ML IJ SOLN
INTRAMUSCULAR | Status: DC | PRN
  Administered 2016-03-06 (×4): 2 mg via INTRAVENOUS

## 2016-03-06 MED ORDER — FENTANYL CITRATE (PF) 100 MCG/2ML IJ SOLN
INTRAMUSCULAR | Status: DC | PRN
  Administered 2016-03-06 (×4): 50 ug via INTRAVENOUS

## 2016-03-06 MED ORDER — LIDOCAINE HCL (CARDIAC) 20 MG/ML IV SOLN
INTRAVENOUS | Status: AC
  Filled 2016-03-06: qty 5

## 2016-03-06 MED ORDER — ALVIMOPAN 12 MG PO CAPS
12.0000 mg | ORAL_CAPSULE | Freq: Two times a day (BID) | ORAL | Status: DC
  Administered 2016-03-07: 12 mg via ORAL
  Filled 2016-03-06 (×2): qty 1

## 2016-03-06 MED ORDER — ALVIMOPAN 12 MG PO CAPS
12.0000 mg | ORAL_CAPSULE | Freq: Once | ORAL | Status: AC
  Administered 2016-03-06: 12 mg via ORAL
  Filled 2016-03-06: qty 1

## 2016-03-06 MED ORDER — BRIMONIDINE TARTRATE-TIMOLOL 0.2-0.5 % OP SOLN: 1.0000 [drp] | Freq: Two times a day (BID) | OPHTHALMIC | Status: DC

## 2016-03-06 MED ORDER — HYDROMORPHONE HCL 1 MG/ML IJ SOLN
0.2500 mg | INTRAMUSCULAR | Status: DC | PRN
  Administered 2016-03-06: 0.5 mg via INTRAVENOUS

## 2016-03-06 MED ORDER — ZOLPIDEM TARTRATE 5 MG PO TABS: 5.0000 mg | ORAL_TABLET | Freq: Every evening | ORAL | Status: DC | PRN

## 2016-03-06 MED ORDER — PROCHLORPERAZINE EDISYLATE 5 MG/ML IJ SOLN: 10.0000 mg | Freq: Four times a day (QID) | INTRAMUSCULAR | Status: DC | PRN

## 2016-03-06 MED ORDER — BUPIVACAINE-EPINEPHRINE 0.25% -1:200000 IJ SOLN
INTRAMUSCULAR | Status: AC
  Filled 2016-03-06: qty 1

## 2016-03-06 MED ORDER — LACTATED RINGERS IV BOLUS (SEPSIS): 1000.0000 mL | Freq: Three times a day (TID) | INTRAVENOUS | Status: DC | PRN

## 2016-03-06 MED ORDER — LIDOCAINE HCL (CARDIAC) 20 MG/ML IV SOLN
INTRAVENOUS | Status: DC | PRN
  Administered 2016-03-06: 60 mg via INTRAVENOUS

## 2016-03-06 MED ORDER — AMLODIPINE BESYLATE 5 MG PO TABS
5.0000 mg | ORAL_TABLET | Freq: Every day | ORAL | Status: DC
  Administered 2016-03-07 – 2016-03-08 (×2): 5 mg via ORAL
  Filled 2016-03-06 (×2): qty 1

## 2016-03-06 MED ORDER — MAGIC MOUTHWASH
15.0000 mL | Freq: Four times a day (QID) | ORAL | Status: DC | PRN
  Filled 2016-03-06: qty 15

## 2016-03-06 MED ORDER — PHENYLEPHRINE HCL 10 MG/ML IJ SOLN
INTRAMUSCULAR | Status: DC | PRN
  Administered 2016-03-06: 80 ug via INTRAVENOUS
  Administered 2016-03-06: 40 ug via INTRAVENOUS

## 2016-03-06 MED ORDER — SUGAMMADEX SODIUM 200 MG/2ML IV SOLN
INTRAVENOUS | Status: AC
  Filled 2016-03-06: qty 2

## 2016-03-06 MED ORDER — DEXTROSE 5 % IV SOLN
2.0000 g | Freq: Two times a day (BID) | INTRAVENOUS | Status: AC
  Administered 2016-03-07: 2 g via INTRAVENOUS
  Filled 2016-03-06 (×2): qty 2

## 2016-03-06 MED ORDER — ERLOTINIB HCL 150 MG PO TABS: 150.0000 mg | ORAL_TABLET | Freq: Every day | ORAL | Status: DC

## 2016-03-06 MED ORDER — LACTATED RINGERS IV SOLN
INTRAVENOUS | Status: DC | PRN
  Administered 2016-03-06: 10:00:00 via INTRAVENOUS

## 2016-03-06 MED ORDER — PROPOFOL 10 MG/ML IV BOLUS
INTRAVENOUS | Status: DC | PRN
  Administered 2016-03-06: 150 mg via INTRAVENOUS

## 2016-03-06 MED ORDER — DIPHENHYDRAMINE HCL 50 MG/ML IJ SOLN: 12.5000 mg | Freq: Four times a day (QID) | INTRAMUSCULAR | Status: DC | PRN

## 2016-03-06 MED ORDER — TIMOLOL MALEATE 0.5 % OP SOLN
1.0000 [drp] | Freq: Two times a day (BID) | OPHTHALMIC | Status: DC
  Administered 2016-03-07 – 2016-03-08 (×3): 1 [drp] via OPHTHALMIC
  Filled 2016-03-06: qty 5

## 2016-03-06 MED ORDER — CYCLOSPORINE 0.05 % OP EMUL
1.0000 [drp] | Freq: Two times a day (BID) | OPHTHALMIC | Status: DC
  Administered 2016-03-07 (×2): 1 [drp] via OPHTHALMIC
  Filled 2016-03-06 (×5): qty 1

## 2016-03-06 MED ORDER — ALUM & MAG HYDROXIDE-SIMETH 200-200-20 MG/5ML PO SUSP: 30.0000 mL | Freq: Four times a day (QID) | ORAL | Status: DC | PRN

## 2016-03-06 MED ORDER — MENTHOL 3 MG MT LOZG: 1.0000 | LOZENGE | OROMUCOSAL | Status: DC | PRN

## 2016-03-06 MED ORDER — SODIUM CHLORIDE 0.9 % IV SOLN
INTRAVENOUS | Status: DC | PRN
  Administered 2016-03-06: 13:00:00 via INTRAPERITONEAL

## 2016-03-06 MED ORDER — HYDROMORPHONE HCL 1 MG/ML IJ SOLN
INTRAMUSCULAR | Status: AC
  Filled 2016-03-06: qty 1

## 2016-03-06 MED ORDER — ESCITALOPRAM OXALATE 10 MG PO TABS
10.0000 mg | ORAL_TABLET | Freq: Every day | ORAL | Status: DC
  Administered 2016-03-07 – 2016-03-08 (×2): 10 mg via ORAL
  Filled 2016-03-06 (×2): qty 1

## 2016-03-06 MED ORDER — PHENYLEPHRINE 40 MCG/ML (10ML) SYRINGE FOR IV PUSH (FOR BLOOD PRESSURE SUPPORT)
PREFILLED_SYRINGE | INTRAVENOUS | Status: AC
  Filled 2016-03-06: qty 20

## 2016-03-06 MED ORDER — LIP MEDEX EX OINT
1.0000 "application " | TOPICAL_OINTMENT | Freq: Two times a day (BID) | CUTANEOUS | Status: DC
  Administered 2016-03-06 – 2016-03-08 (×4): 1 via TOPICAL
  Filled 2016-03-06 (×2): qty 7

## 2016-03-06 MED ORDER — VITAMIN C 500 MG PO TABS
500.0000 mg | ORAL_TABLET | Freq: Two times a day (BID) | ORAL | Status: DC
  Administered 2016-03-07 – 2016-03-08 (×3): 500 mg via ORAL
  Filled 2016-03-06 (×4): qty 1

## 2016-03-06 MED ORDER — 0.9 % SODIUM CHLORIDE (POUR BTL) OPTIME
TOPICAL | Status: DC | PRN
  Administered 2016-03-06: 2000 mL

## 2016-03-06 MED ORDER — DEXAMETHASONE SODIUM PHOSPHATE 10 MG/ML IJ SOLN
INTRAMUSCULAR | Status: DC | PRN
  Administered 2016-03-06: 5 mg via INTRAVENOUS

## 2016-03-06 MED ORDER — ROCURONIUM BROMIDE 50 MG/5ML IV SOLN
INTRAVENOUS | Status: AC
  Filled 2016-03-06: qty 2

## 2016-03-06 MED ORDER — LACTATED RINGERS IV SOLN
INTRAVENOUS | Status: DC | PRN
  Administered 2016-03-06 (×2): via INTRAVENOUS

## 2016-03-06 MED ORDER — BUPIVACAINE LIPOSOME 1.3 % IJ SUSP
INTRAMUSCULAR | Status: DC | PRN
  Administered 2016-03-06: 20 mL

## 2016-03-06 MED ORDER — FENTANYL CITRATE (PF) 250 MCG/5ML IJ SOLN
INTRAMUSCULAR | Status: AC
  Filled 2016-03-06: qty 5

## 2016-03-06 MED ORDER — DIPHENHYDRAMINE HCL 12.5 MG/5ML PO ELIX: 12.5000 mg | ORAL_SOLUTION | Freq: Four times a day (QID) | ORAL | Status: DC | PRN

## 2016-03-06 MED ORDER — FAMOTIDINE 40 MG PO TABS
40.0000 mg | ORAL_TABLET | Freq: Every day | ORAL | Status: DC
  Administered 2016-03-07: 40 mg via ORAL
  Filled 2016-03-06 (×2): qty 1

## 2016-03-06 MED ORDER — PROCHLORPERAZINE MALEATE 10 MG PO TABS: 10.0000 mg | ORAL_TABLET | Freq: Four times a day (QID) | ORAL | Status: DC | PRN

## 2016-03-06 MED ORDER — IBUPROFEN 200 MG PO TABS: 200.0000 mg | ORAL_TABLET | Freq: Four times a day (QID) | ORAL | Status: DC | PRN

## 2016-03-06 MED ORDER — ROCURONIUM BROMIDE 100 MG/10ML IV SOLN
INTRAVENOUS | Status: DC | PRN
  Administered 2016-03-06: 60 mg via INTRAVENOUS
  Administered 2016-03-06 (×2): 10 mg via INTRAVENOUS

## 2016-03-06 MED ORDER — LACTATED RINGERS IR SOLN
Status: DC | PRN
  Administered 2016-03-06: 1000 mL

## 2016-03-06 MED ORDER — DEXAMETHASONE SODIUM PHOSPHATE 10 MG/ML IJ SOLN
INTRAMUSCULAR | Status: AC
  Filled 2016-03-06: qty 1

## 2016-03-06 MED ORDER — LISINOPRIL 20 MG PO TABS
20.0000 mg | ORAL_TABLET | Freq: Every day | ORAL | Status: DC
  Administered 2016-03-06: 20 mg via ORAL
  Filled 2016-03-06 (×2): qty 1

## 2016-03-06 SURGICAL SUPPLY — 104 items
APPLIER CLIP 5 13 M/L LIGAMAX5 (MISCELLANEOUS)
APPLIER CLIP ROT 10 11.4 M/L (STAPLE)
BLADE EXTENDED COATED 6.5IN (ELECTRODE) IMPLANT
BLADE SURG SZ11 CARB STEEL (BLADE) ×3 IMPLANT
CABLE HIGH FREQUENCY MONO STRZ (ELECTRODE) IMPLANT
CANNULA REDUC XI 12-8 STAPL (CANNULA) ×1
CANNULA REDUC XI 12-8MM STAPL (CANNULA) ×1
CANNULA REDUCER 12-8 DVNC XI (CANNULA) ×1 IMPLANT
CELLS DAT CNTRL 66122 CELL SVR (MISCELLANEOUS) ×1 IMPLANT
CHLORAPREP W/TINT 26ML (MISCELLANEOUS) ×3 IMPLANT
CLIP APPLIE 5 13 M/L LIGAMAX5 (MISCELLANEOUS) IMPLANT
CLIP APPLIE ROT 10 11.4 M/L (STAPLE) IMPLANT
CLIP LIGATING HEM O LOK PURPLE (MISCELLANEOUS) IMPLANT
CLIP LIGATING HEMO O LOK GREEN (MISCELLANEOUS) IMPLANT
COUNTER NEEDLE 20 DBL MAG RED (NEEDLE) ×3 IMPLANT
COVER MAYO STAND STRL (DRAPES) ×6 IMPLANT
COVER SURGICAL LIGHT HANDLE (MISCELLANEOUS) IMPLANT
COVER TIP SHEARS 8 DVNC (MISCELLANEOUS) ×1 IMPLANT
COVER TIP SHEARS 8MM DA VINCI (MISCELLANEOUS) ×2
DECANTER SPIKE VIAL GLASS SM (MISCELLANEOUS) ×3 IMPLANT
DEVICE TROCAR PUNCTURE CLOSURE (ENDOMECHANICALS) IMPLANT
DRAIN CHANNEL 19F RND (DRAIN) IMPLANT
DRAPE ARM DVNC X/XI (DISPOSABLE) ×4 IMPLANT
DRAPE COLUMN DVNC XI (DISPOSABLE) ×1 IMPLANT
DRAPE DA VINCI XI ARM (DISPOSABLE) ×8
DRAPE DA VINCI XI COLUMN (DISPOSABLE) ×2
DRAPE SURG IRRIG POUCH 19X23 (DRAPES) IMPLANT
DRAPE WARM FLUID 44X44 (DRAPE) IMPLANT
DRSG OPSITE POSTOP 4X10 (GAUZE/BANDAGES/DRESSINGS) IMPLANT
DRSG OPSITE POSTOP 4X6 (GAUZE/BANDAGES/DRESSINGS) ×3 IMPLANT
DRSG OPSITE POSTOP 4X8 (GAUZE/BANDAGES/DRESSINGS) IMPLANT
DRSG TEGADERM 2-3/8X2-3/4 SM (GAUZE/BANDAGES/DRESSINGS) ×3 IMPLANT
DRSG TEGADERM 4X4.75 (GAUZE/BANDAGES/DRESSINGS) IMPLANT
ELECT PENCIL ROCKER SW 15FT (MISCELLANEOUS) ×6 IMPLANT
ELECT REM PT RETURN 9FT ADLT (ELECTROSURGICAL) ×3
ELECTRODE REM PT RTRN 9FT ADLT (ELECTROSURGICAL) ×1 IMPLANT
ENDOLOOP SUT PDS II  0 18 (SUTURE)
ENDOLOOP SUT PDS II 0 18 (SUTURE) IMPLANT
EVACUATOR SILICONE 100CC (DRAIN) IMPLANT
GAUZE SPONGE 2X2 8PLY STRL LF (GAUZE/BANDAGES/DRESSINGS) ×1 IMPLANT
GAUZE SPONGE 4X4 12PLY STRL (GAUZE/BANDAGES/DRESSINGS) IMPLANT
GLOVE ECLIPSE 8.0 STRL XLNG CF (GLOVE) ×9 IMPLANT
GLOVE INDICATOR 8.0 STRL GRN (GLOVE) ×9 IMPLANT
GOWN STRL REUS W/TWL XL LVL3 (GOWN DISPOSABLE) ×12 IMPLANT
KIT PROCEDURE DA VINCI SI (MISCELLANEOUS)
KIT PROCEDURE DVNC SI (MISCELLANEOUS) IMPLANT
LEGGING LITHOTOMY PAIR STRL (DRAPES) IMPLANT
LUBRICANT JELLY K Y 4OZ (MISCELLANEOUS) IMPLANT
MARKER SKIN DUAL TIP RULER LAB (MISCELLANEOUS) ×3 IMPLANT
NEEDLE INSUFFLATION 14GA 120MM (NEEDLE) ×3 IMPLANT
PACK CARDIOVASCULAR III (CUSTOM PROCEDURE TRAY) ×3 IMPLANT
PACK COLON (CUSTOM PROCEDURE TRAY) ×3 IMPLANT
PORT LAP GEL ALEXIS MED 5-9CM (MISCELLANEOUS) IMPLANT
RTRCTR WOUND ALEXIS 18CM MED (MISCELLANEOUS) ×3
SCISSORS LAP 5X35 DISP (ENDOMECHANICALS) ×3 IMPLANT
SCRUB PCMX 4 OZ (MISCELLANEOUS) IMPLANT
SEAL CANN UNIV 5-8 DVNC XI (MISCELLANEOUS) ×3 IMPLANT
SEAL XI 5MM-8MM UNIVERSAL (MISCELLANEOUS) ×6
SEALER VESSEL DA VINCI XI (MISCELLANEOUS)
SEALER VESSEL EXT DVNC XI (MISCELLANEOUS) IMPLANT
SET BI-LUMEN FLTR TB AIRSEAL (TUBING) ×3 IMPLANT
SET TUBE IRRIG SUCTION NO TIP (IRRIGATION / IRRIGATOR) ×3 IMPLANT
SOLUTION ELECTROLUBE (MISCELLANEOUS) ×3 IMPLANT
SPONGE GAUZE 2X2 STER 10/PKG (GAUZE/BANDAGES/DRESSINGS) ×2
STAPLER 45 BLU RELOAD XI (STAPLE) ×5 IMPLANT
STAPLER 45 BLUE RELOAD XI (STAPLE) ×10
STAPLER 45 GREEN RELOAD XI (STAPLE)
STAPLER 45 GRN RELOAD XI (STAPLE) IMPLANT
STAPLER CANNULA SEAL DVNC XI (STAPLE) ×1 IMPLANT
STAPLER CANNULA SEAL XI (STAPLE) ×2
STAPLER SHEATH (SHEATH) ×2
STAPLER SHEATH ENDOWRIST DVNC (SHEATH) ×1 IMPLANT
SUT MNCRL AB 4-0 PS2 18 (SUTURE) ×3 IMPLANT
SUT PDS AB 1 CTX 36 (SUTURE) ×6 IMPLANT
SUT PDS AB 1 TP1 96 (SUTURE) IMPLANT
SUT PDS AB 2-0 CT2 27 (SUTURE) IMPLANT
SUT PROLENE 0 CT 2 (SUTURE) ×3 IMPLANT
SUT PROLENE 2 0 SH DA (SUTURE) IMPLANT
SUT SILK 2 0 (SUTURE)
SUT SILK 2 0 SH CR/8 (SUTURE) IMPLANT
SUT SILK 2-0 18XBRD TIE 12 (SUTURE) IMPLANT
SUT SILK 3 0 (SUTURE)
SUT SILK 3 0 SH CR/8 (SUTURE) IMPLANT
SUT SILK 3-0 18XBRD TIE 12 (SUTURE) IMPLANT
SUT V-LOC BARB 180 2/0GR6 GS22 (SUTURE) ×3
SUT VIC AB 3-0 SH 18 (SUTURE) IMPLANT
SUT VIC AB 3-0 SH 27 (SUTURE)
SUT VIC AB 3-0 SH 27XBRD (SUTURE) IMPLANT
SUT VICRYL 0 UR6 27IN ABS (SUTURE) ×3 IMPLANT
SUT VLOC 180 2-0 9IN GS21 (SUTURE) ×3 IMPLANT
SUTURE V-LC BRB 180 2/0GR6GS22 (SUTURE) ×1 IMPLANT
SYRINGE 10CC LL (SYRINGE) ×3 IMPLANT
SYS LAPSCP GELPORT 120MM (MISCELLANEOUS)
SYSTEM LAPSCP GELPORT 120MM (MISCELLANEOUS) IMPLANT
TAPE UMBILICAL COTTON 1/8X30 (MISCELLANEOUS) ×3 IMPLANT
TOWEL OR 17X26 10 PK STRL BLUE (TOWEL DISPOSABLE) IMPLANT
TOWEL OR NON WOVEN STRL DISP B (DISPOSABLE) ×3 IMPLANT
TRAY FOLEY W/METER SILVER 14FR (SET/KITS/TRAYS/PACK) ×3 IMPLANT
TRAY FOLEY W/METER SILVER 16FR (SET/KITS/TRAYS/PACK) IMPLANT
TROCAR ADV FIXATION 5X100MM (TROCAR) ×3 IMPLANT
TROCAR Z-THREAD FIOS 5X100MM (TROCAR) IMPLANT
TUBING CONNECTING 10 (TUBING) IMPLANT
TUBING CONNECTING 10' (TUBING)
TUNNELER SHEATH ON-Q 16GX12 DP (PAIN MANAGEMENT) IMPLANT

## 2016-03-06 NOTE — Anesthesia Procedure Notes (Signed)
Procedure Name: Intubation Date/Time: 03/06/2016 11:32 AM Performed by: Freddie Breech Pre-anesthesia Checklist: Patient identified, Emergency Drugs available, Suction available, Patient being monitored and Timeout performed Patient Re-evaluated:Patient Re-evaluated prior to inductionOxygen Delivery Method: Circle system utilized Preoxygenation: Pre-oxygenation with 100% oxygen Intubation Type: IV induction Ventilation: Mask ventilation without difficulty and Oral airway inserted - appropriate to patient size Laryngoscope Size: Mac and 3 Grade View: Grade II Tube type: Oral Tube size: 7.0 mm Number of attempts: 1 Airway Equipment and Method: Patient positioned with wedge pillow and Stylet Placement Confirmation: ETT inserted through vocal cords under direct vision,  positive ETCO2,  CO2 detector and breath sounds checked- equal and bilateral Secured at: 22 cm Tube secured with: Tape Dental Injury: Teeth and Oropharynx as per pre-operative assessment

## 2016-03-06 NOTE — Interval H&P Note (Signed)
History and Physical Interval Note:  03/06/2016 10:30 AM  Kristin [name]  has presented today for surgery, with the diagnosis of ascending colon cancer  The various methods of treatment have been discussed with the patient and family. After consideration of risks, benefits and other options for treatment, the patient has consented to  Procedure(s): XI [place] (N/A) as a surgical intervention .  The patient's history has been reviewed, patient examined, no change in status, stable for surgery.  I have reviewed the patient's chart and labs.  Questions were answered to the patient's satisfaction.     Raysean Graumann C.

## 2016-03-06 NOTE — Anesthesia Postprocedure Evaluation (Signed)
Anesthesia Post Note  Patient: Kristin [name]  Procedure(s) Performed: Procedure(s) (LRB): XI ROBOT PROXIMAL  COLECTOMY (N/A)  Patient location during evaluation: PACU Anesthesia Type: General Level of consciousness: awake and alert Pain management: pain level controlled Vital Signs Assessment: post-procedure vital signs reviewed and stable Respiratory status: spontaneous breathing, nonlabored ventilation, respiratory function stable and patient connected to nasal cannula oxygen Cardiovascular status: blood pressure returned to baseline and stable Postop Assessment: no signs of nausea or vomiting Anesthetic complications: no    Last Vitals:  Filed Vitals:   03/06/16 1423 03/06/16 1430  BP:  152/60  Pulse: 69 68  Temp:    Resp: 17 14    Last Pain:  Filed Vitals:   03/06/16 1433  PainSc: 4                  Natosha Bou,W. EDMOND

## 2016-03-06 NOTE — H&P (Signed)
Kristin [name] 01/19/2016 4:07 PM Location: [facility] Patient #: [id number] DOB: 03-26-1941 Married / Language: [name] / Race: White Female  Patient Care Team: Eber Hong, MD as PCP - General (Internal Medicine) Michael Boston, MD as Consulting Physician (General Surgery) Manus Gunning, MD as Consulting Physician (Gastroenterology) Curt Bears, MD as Consulting Physician (Oncology)   History of Present Illness  The patient is a 75 year old female who presents with colorectal cancer. Note for "Colorectal cancer": Patient sent by her gastroenterologist for concerns of an ascending colon cancer causing symptomatic anemia.  Pleasant active woman. Comes today with her husband and daughter. History of breast cancer status post mastectomy and TRAM flaps 2002. 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 On Tarceva chemotherapy. 5 years. No evidence of progression or recurrence. Had a stroke about 3 years ago. No A. fib. No clot. Go okay. Started on Plavix anticoagulation. Dr. Inda Merlin for her cancers. Found to have hemoglobin 10.7 in November. Sent for gastrology workup. Upper endoscopy conferred her moderate size hiatal hernia. No ulcerations. She takes famotidine and omeprazole. That usually control symptoms quite well. No changes recently. No dysphagia.  Colonoscopy revealed large bulky lesion in ascending colon suspicious for cancer. CT scan notes a liver cyst but no evidence of other abnormalities. Lungs scarring but no evidence of lung recurrence. Surgical consultation requested to see if surgery appropriate.  Patient is relatively active and independent. Upping. Can walk 20 minutes before she has to stop. Stand for balance issues. His been having some joint issues. Tentative plan for left hip arthroplasty in July by Dr. Sherre Poot. She normally has a few loose bowel movements in the morning. Usually  related to her chemotherapy. She had an appendectomy and tubal ligation intra-abdominally. TRAM flaps for her breast cancer reconstructions.  No personal nor family history of inflammatory bowel disease, irritable bowel syndrome, allergy such as Celiac Sprue, dietary/dairy problems, colitis, ulcers nor gastritis. No recent sick contacts/gastroenteritis. No travel outside the country. No changes in diet. No dysphagia to solids or liquids. No significant heartburn or reflux. No hematochezia, hematemesis, coffee ground emesis. No evidence of prior gastric/peptic ulceration. Strong family history of cancers in her family.   Other Problems Illene Regulus, CMA; 01/19/2016 4:08 PM) Breast Cancer Cerebrovascular Accident Colon Cancer Gastroesophageal Reflux Disease High blood pressure Lung Cancer Transfusion history  Past Surgical History Illene Regulus, CMA; 01/19/2016 4:08 PM) Appendectomy Breast Biopsy Bilateral. Breast Reconstruction Bilateral. Cataract Surgery Bilateral. Colon Polyp Removal - Colonoscopy Mastectomy Bilateral. Tonsillectomy  Diagnostic Studies History Lars Mage Spillers, CMA; 01/19/2016 4:07 PM) Colonoscopy within last year Pap Smear >5 years ago  Allergies Illene Regulus, CMA; 01/19/2016 4:09 PM) Doxycycline *DERMATOLOGICALS* Sulfabenzamide *CHEMICALS*  Medication History (Alisha Spillers, CMA; 01/19/2016 4:12 PM) AmLODIPine Besylate ('5MG'$  Tablet, Oral) Active. Combigan (0.2-0.5% Solution, Ophthalmic) Active. Clopidogrel Bisulfate ('75MG'$  Tablet, Oral) Active. Tarceva ('150MG'$  Tablet, Oral) Active. Escitalopram Oxalate ('10MG'$  Tablet, Oral) Active. Letrozole (2.'5MG'$  Tablet, Oral) Active. Lisinopril ('20MG'$  Tablet, Oral) Active. Folivane-Plus (Oral) Active. Medications Reconciled  Social History Illene Regulus, CMA; 01/19/2016 4:08 PM) Caffeine use Carbonated beverages, Coffee, Tea. No alcohol use No drug use Tobacco use Never  smoker.  Family History Illene Regulus, CMA; 01/19/2016 4:08 PM) Breast Cancer Sister. Colon Cancer Sister. Colon Polyps Sister, Son. Respiratory Condition Father.  Pregnancy / Birth History Illene Regulus, CMA; 01/19/2016 4:08 PM) Age at menarche 57 years. Age of menopause >26 Gravida 2 Maternal age 3-25 Para 2    Review of Systems Lars Mage  Spillers CMA; 01/19/2016 4:08 PM) General Present- Fatigue and Weight Loss. Not Present- Appetite Loss, Chills, Fever, Night Sweats and Weight Gain. Skin Present- Dryness. Not Present- Change in Wart/Mole, Hives, Jaundice, New Lesions, Non-Healing Wounds, Rash and Ulcer. HEENT Present- Wears glasses/contact lenses. Not Present- Earache, Hearing Loss, Hoarseness, Nose Bleed, Oral Ulcers, Ringing in the Ears, Seasonal Allergies, Sinus Pain, Sore Throat, Visual Disturbances and Yellow Eyes. Respiratory Not Present- Bloody sputum, Chronic Cough, Difficulty Breathing, Snoring and Wheezing. Breast Not Present- Breast Mass, Breast Pain, Nipple Discharge and Skin Changes. Cardiovascular Not Present- Chest Pain, Difficulty Breathing Lying Down, Leg Cramps, Palpitations, Rapid Heart Rate, Shortness of Breath and Swelling of Extremities. Gastrointestinal Not Present- Abdominal Pain, Bloating, Bloody Stool, Change in Bowel Habits, Chronic diarrhea, Constipation, Difficulty Swallowing, Excessive gas, Gets full quickly at meals, Hemorrhoids, Indigestion, Nausea, Rectal Pain and Vomiting. Female Genitourinary Not Present- Frequency, Nocturia, Painful Urination, Pelvic Pain and Urgency. Musculoskeletal Not Present- Back Pain, Joint Pain, Joint Stiffness, Muscle Pain, Muscle Weakness and Swelling of Extremities. Neurological Not Present- Decreased Memory, Fainting, Headaches, Numbness, Seizures, Tingling, Tremor, Trouble walking and Weakness. Psychiatric Not Present- Anxiety, Bipolar, Change in Sleep Pattern, Depression, Fearful and Frequent  crying. Endocrine Not Present- Cold Intolerance, Excessive Hunger, Hair Changes, Heat Intolerance, Hot flashes and New Diabetes. Hematology Present- Easy Bruising. Not Present- Excessive bleeding, Gland problems, HIV and Persistent Infections.  Vitals  01/19/2016 4:08 PM Weight: 136 lb Height: 67in Body Surface Area: 1.72 m Body Mass Index: 21.3 kg/m  Pulse: 76 (Regular)  BP: 158/78 (Sitting, Left Arm, Standard)  There were no vitals taken for this visit.      Physical Exam [name] MD; 01/19/2016 5:03 PM) General Mental Status-Alert. General Appearance-Not in acute distress, Not Sickly. Orientation-Oriented X3. Hydration-Well hydrated. Voice-Normal. Note: Thin but not really cachectic.   Integumentary Global Assessment Upon inspection and palpation of skin surfaces of the - Axillae: non-tender, no inflammation or ulceration, no drainage. and Distribution of scalp and body hair is normal. General Characteristics Temperature - normal warmth is noted.  Head and Neck Head-normocephalic, atraumatic with no lesions or palpable masses. Face Global Assessment - atraumatic, no absence of expression. Neck Global Assessment - no abnormal movements, no bruit auscultated on the right, no bruit auscultated on the left, no decreased range of motion, non-tender. Trachea-midline. Thyroid Gland Characteristics - non-tender.  Eye Eyeball - Left-Extraocular movements intact, No Nystagmus. Eyeball - Right-Extraocular movements intact, No Nystagmus. Cornea - Left-No Hazy. Cornea - Right-No Hazy. Sclera/Conjunctiva - Left-No scleral icterus, No Discharge. Sclera/Conjunctiva - Right-No scleral icterus, No Discharge. Pupil - Left-Direct reaction to light normal. Pupil - Right-Direct reaction to light normal.  ENMT Ears Pinna - Left - no drainage observed, no generalized tenderness observed. Right - no drainage observed, no generalized  tenderness observed. Nose and Sinuses External Inspection of the Nose - no destructive lesion observed. Inspection of the nares - Left - quiet respiration. Right - quiet respiration. Mouth and Throat Lips - Upper Lip - no fissures observed, no pallor noted. Lower Lip - no fissures observed, no pallor noted. Nasopharynx - no discharge present. Oral Cavity/Oropharynx - Tongue - no dryness observed. Oral Mucosa - no cyanosis observed. Hypopharynx - no evidence of airway distress observed.  Chest and Lung Exam Inspection Movements - Normal and Symmetrical. Accessory muscles - No use of accessory muscles in breathing. Palpation Palpation of the chest reveals - Non-tender. Auscultation Breath sounds - Normal and Clear.  Breast Note: Status post mastectomy with TRAM flaps.  No obvious skin abnormalities.   Cardiovascular Auscultation Rhythm - Regular. Murmurs & Other Heart Sounds - Auscultation of the heart reveals - No Murmurs and No Systolic Clicks.  Abdomen Inspection Inspection of the abdomen reveals - No Visible peristalsis and No Abnormal pulsations. Umbilicus - No Bleeding, No Urine drainage. Palpation/Percussion Palpation and Percussion of the abdomen reveal - Soft, Non Tender, No Rebound tenderness, No Rigidity (guarding) and No Cutaneous hyperesthesia. Note: Narrow somewhat firm and consistent with prior TRAM flaps. Transverse suprapubic incision consistent with prior TRAM flaps. Short abdominal height but soft. No diastases. No umbilical hernia.   Female Genitourinary Sexual Maturity Tanner 5 - Adult hair pattern. Note: No vaginal bleeding nor discharge   Rectal Note: Deferred given recent colonoscopy   Peripheral Vascular Upper Extremity Inspection - Left - No Cyanotic nailbeds, Not Ischemic. Right - No Cyanotic nailbeds, Not Ischemic.  Neurologic Neurologic evaluation reveals -normal attention span and ability to concentrate, able to name objects and repeat  phrases. Appropriate fund of knowledge , normal sensation and normal coordination. Mental Status Affect - not angry, not paranoid. Cranial Nerves-Normal Bilaterally. Gait-Normal.  Neuropsychiatric Mental status exam performed with findings of-able to articulate well with normal speech/language, rate, volume and coherence, thought content normal with ability to perform basic computations and apply abstract reasoning and no evidence of hallucinations, delusions, obsessions or homicidal/suicidal ideation.  Musculoskeletal Global Assessment Spine, Ribs and Pelvis - no instability, subluxation or laxity. Right Upper Extremity - no instability, subluxation or laxity.  Lymphatic Head & Neck  General Head & Neck Lymphatics: Bilateral - Description - No Localized lymphadenopathy. Axillary  General Axillary Region: Bilateral - Description - No Localized lymphadenopathy. Femoral & Inguinal  Generalized Femoral & Inguinal Lymphatics: Left - Description - No Localized lymphadenopathy. Right - Description - No Localized lymphadenopathy.    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 Reasonable candidate for robotic resection with intracorporeal anastomosis he and extraction through smaller incision to avoid hernia issues. Small her abdomen still should be feasible.  She's had numerous cancers but has no evidence recurrence from her breast cancer in 2002. She has no evidence of lung cancer recurrence stable on Tarceva after almost 5 years.  Because she is rather functional independent with a decent quality life, they wish to be aggressive. We'll proceed with surgery.  Give her numerous health issues and okay performance status, would like cardiac clearance. She did have echocardiogram looked okay 3 years ago when she had her stroke. Would like to be sure not missing anything.  I would assume would be okay to  come off the Plavix perioperatively. We'll ask primary care physician if he has any hesitancy. Dr. Brynda Greathouse. Current Plans I recommended obtaining preoperative medical clearance. Make sure it is safe to come off the Plavix given her history of prior stroke. I am concerned about the health of the patient and the ability to tolerate the operation. Therefore, we will request clearance by medicine to better assess operative risk & see if a reevaluation, further workup, etc is needed. Also recommendations on how medications should be managed/held/restarted after surgery. I recommended obtaining preoperative cardiac clearance. I am concerned about the health of the patient and the ability to tolerate the operation. Therefore, we will request clearance by cardiology to better assess operative risk & see if a reevaluation, further workup, etc is needed. Also recommendations on how medications such as for anticoagulation and blood pressure should be  managed/held/restarted after surgery.   ENCOUNTER FOR PREOPERATIVE EXAMINATION FOR GENERAL SURGICAL PROCEDURE (Z01.818) Current Plans You are being scheduled for surgery - Our schedulers will call you.  You should hear from our office's scheduling department within 5 working days about the location, date, and time of surgery. We try to make accommodations for patient's preferences in scheduling surgery, but sometimes the OR schedule or the surgeon's schedule prevents [place] from making those accommodations.  If you have not heard from our office [phone]) in 5 working days, call the office and ask for your surgeon's nurse.  If you have other questions about your diagnosis, plan, or surgery, call the office and ask for your surgeon's nurse.  Written instructions provided Pt Education - [facility] Colon Bowel Prep 2015 Miralax/Antibiotics Started Neomycin Sulfate '500MG'$ , 2 (two) Tablet SEE NOTE, #6, 01/19/2016, No Refill. Local Order: TAKE TWO TABLETS AT 2 PM, 3 PM, AND 10  PM THE DAY PRIOR TO SURGERY Started Flagyl '500MG'$ , 2 (two) Tablet SEE NOTE, #6, 01/19/2016, No Refill. Local Order: Take at 2pm, 3pm, and 10pm the day prior to your colon operation Pt Education - [facility] Colectomy post-op instructions: discussed with patient and provided information. Pt Education - Pamphlet Given - Laparoscopic Colorectal Surgery: discussed with patient and provided information. Pt Education - [facility] Good Bowel Health (Patrisha Hausmann) Pt Education - [facility] Pain Control (Jalisha Enneking) DIFFICULT AIRWAY, SUBSEQUENT ENCOUNTER (T88.4XXD) Impression: The patient had difficult airway on last case and 2012. Needed a glide scope. Let anesthesia beware. 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 No evidence of any severe dysphasia or other intractable symptoms that warrant surgical repair. Would leave it alone. She agrees.  I have re-reviewed the the patient's records, history, medications, and allergies.  I have re-examined the patient.  I again discussed intraoperative plans and goals of post-operative recovery.  The patient agrees to proceed.  [name], M.D., F.A.C.S. Gastrointestinal and Minimally Invasive Surgery [facility] [address] [place], [place] [address] [phone] Main / Paging

## 2016-03-06 NOTE — Progress Notes (Signed)
5/17: PTA Erlotinib continued post-op colectomy, ileocolonic anastomosis, omentopexy.  Erlotinib not resumed: can increase risk of infection, risk of GI perfororation increased with NSAIDs  Suggest consult Oncologist if question on resumption of medication  Erlotinib (Tarceva) hold criteria  SCr > 1.5x baseline (or > 2 if baseline unknown)  AST or ALT > 3x ULN  Bili > 1.5x ULN  Acute coronary syndrome  Acute CVA  Bullous or exfoliative skin eruption  Gastrointestinal perforation  Unexplained pneumonitis / hypoxemia  Thank you, Minda Ditto PharmD Pager [phone] 03/06/2016, 4:58 PM

## 2016-03-06 NOTE — Transfer of Care (Signed)
Immediate Anesthesia Transfer of Care Note  Patient: Kristin [name]  Procedure(s) Performed: Procedure(s): XI ROBOT PROXIMAL  COLECTOMY (N/A)  Patient Location: PACU  Anesthesia Type:General  Level of Consciousness:  sedated, patient cooperative and responds to stimulation  Airway & Oxygen Therapy:Patient Spontanous Breathing and Patient connected to face mask oxgen  Post-op Assessment:  Report given to PACU RN and Post -op Vital signs reviewed and stable  Post vital signs:  Reviewed and stable  Last Vitals:  Filed Vitals:   03/06/16 0932 03/06/16 1341  BP: 167/57 156/67  Pulse: 68 86  Temp: 36.7 C 36.6 C  Resp: 16 19    Complications: No apparent anesthesia complications

## 2016-03-06 NOTE — Discharge Instructions (Signed)
SURGERY: POST OP INSTRUCTIONS (Surgery for small bowel obstruction, colon resection, etc)    DIET Follow a light diet the first few days at home.  Start with a bland diet such as soups, liquids, starchy foods, low fat foods, etc.  If you feel full, bloated, or constipated, stay on a ful liquid or pureed/blenderized diet for a few days until you feel better and no longer constipated. Be sure to drink plenty of fluids every day to avoid getting dehydrated (feeling dizzy, not urinating, etc.). Gradually add a fiber supplement to your diet over the next week.  Gradually get back to a regular solid diet.  Avoid fast food or heavy meals the first week as you are more likely to get nauseated. It is expected for your digestive tract to need a few months to get back to normal.  It is common for your bowel movements and stools to be irregular.  You will have occasional bloating and cramping that should eventually fade away.  Until you are eating solid food normally, off all pain medications, and back to regular activities; your bowels will not be normal. Focus on eating a low-fat, high fiber diet the rest of your life (See Getting to [place], below).  CARE of your INCISION or WOUND It is good for closed incision and even open wounds to be washed every day.  Shower every day.  Short baths are fine.  Wash the incisions and wounds clean with soap & water.    If you have a closed incision(s), wash the incision with soap & water every day.  You may leave closed incisions open to air if it is dry.   You may cover the incision with clean gauze & replace it after your daily shower for comfort. If you have skin tapes (Steristrips) or skin glue (Dermabond) on your incision, leave them in place.  They will fall off on their own like a scab.  You may trim any edges that curl up with clean scissors.  If you have staples, set up an appointment for them to be removed in the office in 10 days after surgery.  If you  have a drain, wash around the skin exit site with soap & water and place a new dressing of gauze or band aid around the skin every day.  Keep the drain site clean & dry.    If you have an open wound with packing, see wound care instructions.  In general, it is encouraged that you remove your dressing and packing, shower with soap & water, and replace your dressing once a day.  Pack the wound with clean gauze moistened with normal (0.9%) saline to keep the wound moist & uninfected.  Pressure on the dressing for 30 minutes will stop most wound bleeding.  Eventually your body will heal & pull the open wound closed over the next few months.  Raw open wounds will occasionally bleed or secrete yellow drainage until it heals closed.  Drain sites will drain a little until the drain is removed.  Even closed incisions can have mild bleeding or drainage the first few days until the skin edges scab over & seal.   If you have an open wound with a wound vac, see wound vac care instructions.     ACTIVITIES as tolerated Start light daily activities --- self-care, walking, climbing stairs-- beginning the day after surgery.  Gradually increase activities as tolerated.  Control your pain to be active.  Stop when you  are tired.  Ideally, walk several times a day, eventually an hour a day.   Most people are back to most day-to-day activities in a few weeks.  It takes 4-8 weeks to get back to unrestricted, intense activity. If you can walk 30 minutes without difficulty, it is safe to try more intense activity such as jogging, treadmill, bicycling, low-impact aerobics, swimming, etc. Save the most intensive and strenuous activity for last (Usually 4-8 weeks after surgery) such as sit-ups, heavy lifting, contact sports, etc.  Refrain from any intense heavy lifting or straining until you are off narcotics for pain control.  You will have off days, but things should improve week-by-week. DO NOT PUSH THROUGH PAIN.  Let pain be  your guide: If it hurts to do something, don't do it.  Pain is your body warning you to avoid that activity for another week until the pain goes down. You may drive when you are no longer taking narcotic prescription pain medication, you can comfortably wear a seatbelt, and you can safely make sudden turns/stops to protect yourself without hesitating due to pain. You may have sexual intercourse when it is comfortable. If it hurts to do something, stop.  MEDICATIONS Take your usually prescribed home medications unless otherwise directed.   Blood thinners:  Usually you can restart any strong blood thinners after the second postoperative day.  It is OK to take aspirin right away.     If you are on strong blood thinners (warfarin/Coumadin, Plavix, Xerelto, Eliquis, Pradaxa, etc), discuss with your surgeon, medicine PCP, and/or cardiologist for instructions on when to restart the blood thinner & if blood monitoring is needed (PT/INR blood check, etc).     PAIN CONTROL Pain after surgery or related to activity is often due to strain/injury to muscle, tendon, nerves and/or incisions.  This pain is usually short-term and will improve in a few months.  To help speed the process of healing and to get back to regular activity more quickly, DO THE FOLLOWING THINGS TOGETHER: 1. Increase activity gradually.  DO NOT PUSH THROUGH PAIN 2. Use Ice and/or Heat 3. Try Gentle Massage and/or Stretching 4. Take over the counter pain medication 5. Take Narcotic prescription pain medication for more severe pain  Good pain control = faster recovery.  It is better to take more medicine to be more active than to stay in bed all day to avoid medications. 1.  Increase activity gradually Avoid heavy lifting at first, then increase to lifting as tolerated over the next 6 weeks. Do not push through the pain.  Listen to your body and avoid positions and maneuvers than reproduce the pain.  Wait a few days before trying  something more intense Walking an hour a day is encouraged to help your body recover faster and more safely.  Start slowly and stop when getting sore.  If you can walk 30 minutes without stopping or pain, you can try more intense activity (running, jogging, aerobics, cycling, swimming, treadmill, sex, sports, weightlifting, etc.) Remember: If it hurts to do it, then dont do it! 2. Use Ice and/or Heat You will have swelling and bruising around the incisions.  This will take several weeks to resolve. Ice packs or heating pads (6-8 times a day, 30-60 minutes at a time) will help sooth soreness & bruising. Some people prefer to use ice alone, heat alone, or alternate between ice & heat.  Experiment and see what works best for you.  Consider trying ice for the first  few days to help decrease swelling and bruising; then, switch to heat to help relax sore spots and speed recovery. Shower every day.  Short baths are fine.  It feels good!  Keep the incisions and wounds clean with soap & water.   3. Try Gentle Massage and/or Stretching Massage at the area of pain many times a day Stop if you feel pain - do not overdo it 4. Take over the counter pain medication This helps the muscle and nerve tissues become less irritable and calm down faster Choose ONE of the following over-the-counter anti-inflammatory medications: Acetaminophen '500mg'$  tabs (Tylenol) 1-2 pills with every meal and just before bedtime (avoid if you have liver problems or if you have acetaminophen in you narcotic prescription) Naproxen '220mg'$  tabs (ex. Aleve, Naprosyn) 1-2 pills twice a day (avoid if you have kidney, stomach, IBD, or bleeding problems) Ibuprofen '200mg'$  tabs (ex. Advil, Motrin) 3-4 pills with every meal and just before bedtime (avoid if you have kidney, stomach, IBD, or bleeding problems) Take with food/snack several times a day as directed for at least 2 weeks to help keep pain / soreness down & more manageable. 5. Take Narcotic  prescription pain medication for more severe pain A prescription for strong pain control is often given to you upon discharge (for example: oxycodone/Percocet, hydrocodone/Norco/Vicodin, or tramadol/Ultram) Take your pain medication as prescribed. Be mindful that most narcotic prescriptions contain Tylenol (acetaminophen) as well - avoid taking too much Tylenol. If you are having problems/concerns with the prescription medicine (does not control pain, nausea, vomiting, rash, itching, etc.), please call us [phone] to see if we need to switch you to a different pain medicine that will work better for you and/or control your side effects better. If you need a refill on your pain medication, you must call the office before 4 pm and on weekdays only.  By federal law, prescriptions for narcotics cannot be called into a pharmacy.  They must be filled out on paper & picked up from our office by the patient or authorized caretaker.  Prescriptions cannot be filled after 4 pm nor on weekends.   WHEN TO CALL us [phone] Severe uncontrolled or worsening pain  Fever over 101 F (38.5 C) Concerns with the incision: Worsening pain, redness, rash/hives, swelling, bleeding, or drainage Reactions / problems with new medications (itching, rash, hives, nausea, etc.) Nausea and/or vomiting Difficulty urinating Difficulty breathing Worsening fatigue, dizziness, lightheadedness, blurred vision Other concerns If you are not getting better after two weeks or are noticing you are getting worse, contact our office [phone] for further advice.  We may need to adjust your medications, re-evaluate you in the office, send you to the emergency room, or see what other things we can do to help. The clinic staff is available to answer your questions during regular business hours (8:30am-5pm).  Please dont hesitate to call and ask to speak to one of our nurses for clinical concerns.    A surgeon from [facility] is always on call at the hospitals 24 hours/day If you have a medical emergency, go to the nearest emergency room or call 911. FOLLOW UP in our office One the day of your discharge from the hospital (or the next business weekday), please call [facility] to set up or confirm an appointment to see your surgeon in the office for a follow-up appointment.  Usually it is 2-3 weeks after your surgery.   If you have skin staples at  your incision(s), let the office know so we can set up a time in the office for the nurse to remove them (usually around 10 days after surgery). Make sure that you call for appointments the day of discharge (or the next business weekday) from the hospital to ensure a convenient appointment time. IF YOU HAVE DISABILITY OR FAMILY LEAVE FORMS, BRING THEM TO THE OFFICE FOR PROCESSING.  DO NOT GIVE THEM TO YOUR DOCTOR.  [facility] [address] [place], [place], [place]  [address] ? [phone] - Main [phone] - [place],  [phone] - Fax [email or web address]  GETTING TO GOOD BOWEL HEALTH. It is expected for your digestive tract to need a few months to get back to normal.  It is common for your bowel movements and stools to be irregular.  You will have occasional bloating and cramping that should eventually fade away.  Until you are eating solid food normally, off all pain medications, and back to regular activities; your bowels will not be normal.   Avoiding constipation The goal: ONE SOFT BOWEL MOVEMENT A DAY!    Drink plenty of fluids.  Choose water first. TAKE A FIBER SUPPLEMENT EVERY DAY THE REST OF YOUR LIFE During your first week back home, gradually add back a fiber supplement every day Experiment which form you can tolerate.   There are many forms such as powders, tablets, wafers, gummies, etc Psyllium bran (Metamucil), methylcellulose (Citrucel), Miralax or Glycolax, Benefiber, Flax Seed.    Adjust the dose week-by-week (1/2 dose/day to 6 doses a day) until you are moving your bowels 1-2 times a day.  Cut back the dose or try a different fiber product if it is giving you problems such as diarrhea or bloating. Sometimes a laxative is needed to help jump-start bowels if constipated until the fiber supplement can help regulate your bowels.  If you are tolerating eating & you are farting, it is okay to try a gentle laxative such as double dose MiraLax, prune juice, or Milk of Magnesia.  Avoid using laxatives too often. Stool softeners can sometimes help counteract the constipating effects of narcotic pain medicines.  It can also cause diarrhea, so avoid using for too long. If you are still constipated despite taking fiber daily, eating solids, and a few doses of laxatives, call our office. Controlling diarrhea Try drinking liquids and eating bland foods for a few days to avoid stressing your intestines further. Avoid dairy products (especially milk & ice cream) for a short time.  The intestines often can lose the ability to digest lactose when stressed. Avoid foods that cause gassiness or bloating.  Typical foods include beans and other legumes, cabbage, broccoli, and dairy foods.  Avoid greasy, spicy, fast foods.  Every person has some sensitivity to other foods, so listen to your body and avoid those foods that trigger problems for you. Probiotics (such as active yogurt, Align, etc) may help repopulate the intestines and colon with normal bacteria and calm down a sensitive digestive tract Adding a fiber supplement gradually can help thicken stools by absorbing excess fluid and retrain the intestines to act more normally.  Slowly increase the dose over a few weeks.  Too much fiber too soon can backfire and cause cramping & bloating. It is okay to try and slow down diarrhea with a few doses of antidiarrheal medicines.   Bismuth subsalicylate (ex. Kayopectate, Pepto Bismol) for a few doses can  help control diarrhea.  Avoid if pregnant.  Loperamide (Imodium) can slow down diarrhea.  Start with one tablet ('2mg'$ ) first.  Avoid if you are having fevers or severe pain.  ILEOSTOMY PATIENTS WILL HAVE CHRONIC DIARRHEA since their colon is not in use.    Drink plenty of liquids.  You will need to drink even more glasses of water/liquid a day to avoid getting dehydrated. Record output from your ileostomy.  Expect to empty the bag every 3-4 hours at first.  Most people with a permanent ileostomy empty their bag 4-6 times at the least.   Use antidiarrheal medicine (especially Imodium) several times a day to avoid getting dehydrated.  Start with a dose at bedtime & breakfast.  Adjust up or down as needed.  Increase antidiarrheal medications as directed to avoid emptying the bag more than 8 times a day (every 3 hours). Work with your wound ostomy nurse to learn care for your ostomy.  See ostomy care instructions. TROUBLESHOOTING IRREGULAR BOWELS 1) Start with a soft & bland diet. No spicy, greasy, or fried foods.  2) Avoid gluten/wheat or dairy products from diet to see if symptoms improve. 3) Miralax 17gm or flax seed mixed in [place]. water or juice-daily. May use 2-4 times a day as needed. 4) Gas-X, Phazyme, etc. as needed for gas & bloating.  5) Prilosec (omeprazole) over-the-counter as needed 6)  Consider probiotics (Align, Activa, etc) to help calm the bowels down  Call your doctor if you are getting worse or not getting better.  Sometimes further testing (cultures, endoscopy, X-ray studies, CT scans, bloodwork, etc.) may be needed to help diagnose and treat the cause of the diarrhea. [facility], [place], [place], [place], [place]  [address] [phone] - Main.    [phone]  - Toll Free.   [phone] - Fax [email or web address]  Colorectal Cancer Colorectal cancer is an abnormal growth of tissue (tumor) in the colon or rectum that is cancerous  (malignant). Unlike noncancerous (benign) tumors, malignant tumors can spread to other parts of your body. The colon is the large bowel or large intestine. The rectum is the last several inches of the colon.  RISK FACTORS The exact cause of colorectal cancer is unknown. However, the following factors may increase your chances of getting colorectal cancer:   Age older than 72 years.   Abnormal growths (polyps) on the inner wall of the colon or rectum.   Diabetes.   African American race.   Family history of hereditary nonpolyposis colorectal cancer. This condition is caused by changes in the genes that are responsible for repairing mismatched DNA.   Personal history of cancer. A person who has already had colorectal cancer may develop it a second time. Also, women with a history of ovarian, uterine, or breast cancer are at a somewhat higher risk of developing colorectal cancer.  Certain hereditary conditions.  Eating a diet that is high in fat (especially animal fat) and low in fiber, fruits, and vegetables.  Sedentary lifestyle.  Inflammatory bowel disease, including ulcerative colitis and Crohn's disease.   Smoking.   Excessive alcohol use.  SYMPTOMS Early colorectal cancer often does not cause symptoms. As the cancer grows, symptoms may include:   Changes in bowel habits.  Diarrhea.   Constipation.   Feeling like the bowel does not empty completely after a bowel movement.   Blood in the stool.   Stools that are narrower than usual.   Abdominal discomfort, pain, bloating, fullness, or cramps.  Frequent gas pain.  Unexplained weight loss.   Constant tiredness.   Nausea and vomiting.  DIAGNOSIS  Your health care provider will ask about your medical history. He or she may also perform a number of procedures, such as:   A physical exam.  A digital rectal exam.  A fecal occult blood test.  A barium enema.  Blood tests.   X-rays.    Imaging tests, such as CT scans or MRIs.   Taking a tissue sample (biopsy) from your colon or rectum to look for cancer cells.   A sigmoidoscopy to view the inside of the last part of your colon.   A colonoscopy to view the inside of your entire colon.   An endorectal ultrasound to see how deep a rectal tumor has grown and whether the cancer has spread to lymph nodes or other nearby tissues.  Your cancer will be staged to determine its severity and extent. Staging is a careful attempt to find out the size of the tumor, whether the cancer has spread, and if so, to what parts of the body. You may need to have more tests to determine the stage of your cancer. The test results will help determine what treatment plan is best for you.   Stage 0. The cancer is found only in the innermost lining of the colon or rectum.   Stage I. The cancer has grown into the inner wall of the colon or rectum. The cancer has not yet reached the outer wall of the colon.   Stage II. The cancer extends more deeply into or through the wall of the colon or rectum. It may have invaded nearby tissue, but cancer cells have not spread to the lymph nodes.   Stage III. The cancer has spread to nearby lymph nodes but not to other parts of the body.   Stage IV. The cancer has spread to other parts of the body, such as the liver or lungs.  Your health care provider may tell you the detailed stage of your cancer, which includes both a number and a letter.  TREATMENT  Depending on the type and stage, colorectal cancer may be treated with surgery, radiation therapy, chemotherapy, targeted therapy, or radiofrequency ablation. Some people have a combination of these therapies. Surgery may be done to remove the polyps from your colon. In early stages, your health care provider may be able to do this during a colonoscopy. In later stages, surgery may be done to remove part of your colon.  HOME CARE INSTRUCTIONS   Take  medicines only as directed by your health care provider.   Maintain a healthy diet.   Consider joining a support group. This may help you learn to cope with the stress of having colorectal cancer.   Seek advice to help you manage treatment of side effects.   Keep all follow-up visits as directed by your health care provider.   Inform your cancer specialist if you are admitted to the hospital.  SEEK MEDICAL CARE IF:  Your diarrhea or constipation does not go away.   Your bowel habits change.  You have increased abdominal pain.   You notice new fatigue or weakness.  You lose weight.   This information is not intended to replace advice given to you by your health care provider. Make sure you discuss any questions you have with your health care provider.   Document Released: 10/07/2005 Document Revised: 10/28/2014 Document Reviewed: 04/01/2013 Elsevier Interactive Patient Education [employer].

## 2016-03-06 NOTE — Op Note (Signed)
03/06/2016  1:30 PM  PATIENT:  Kristin [name]  75 y.o. female  Patient Care Team: Eber Hong, MD as PCP - General (Internal Medicine) Michael Boston, MD as Consulting Physician (General Surgery) Manus Gunning, MD as Consulting Physician (Gastroenterology) Curt Bears, MD as Consulting Physician (Oncology) Herminio Commons, MD as Consulting Physician (Cardiology)  PRE-OPERATIVE DIAGNOSIS:  ascending colon cancer  POST-OPERATIVE DIAGNOSIS:    Ascending colon cancer Atypical nevi  PROCEDURE:   XI ROBOT PROXIMAL COLECTOMY with ileocolonic anastomosis OMENTOPEXY Excision nevus   SURGEON:  Surgeon(s): Michael Boston, MD Leighton Ruff, MD - Assist  ANESTHESIA:   local and general  EBL:  Total I/O In: 1500 [I.V.:1500] Out: 150 [Urine:100; Blood:50]  Delay start of Pharmacological VTE agent (>24hrs) due to surgical blood loss or risk of bleeding:  no  DRAINS: none   SPECIMEN:    DISPOSITION OF SPECIMEN:  PATHOLOGY  COUNTS:  YES  PLAN OF CARE: Admit to inpatient   PATIENT DISPOSITION:  PACU - hemodynamically stable.  INDICATION:    Pleasant woman found to have mid ascending colon cancer on endoscopy.  Cleared by cardiology.  Otherwise active.  Patient's family wish to be aggressive.  No uncemented metastatic cancer potentially curable by surgery.  Therefore, I recommended segmental resection:  The anatomy & physiology of the digestive tract was discussed.  The pathophysiology was discussed.  Natural history risks without surgery was discussed.   I worked to give an overview of the disease and the frequent need to have multispecialty involvement.  I feel the risks of no intervention will lead to serious problems that outweigh the operative risks; therefore, I recommended a partial colectomy to remove the pathology.  Laparoscopic & open techniques were discussed.   Risks such as bleeding, infection, abscess, leak, reoperation, possible ostomy, hernia, heart  attack, death, and other risks were discussed.  I noted a good likelihood this will help address the problem.   Goals of post-operative recovery were discussed as well.  We will work to minimize complications.  An educational handout on the pathology was given as well.  Questions were answered.    The patient expresses understanding & wishes to proceed with surgery.  OR FINDINGS:   Patient had a bulky 4 x 4 by 3 cm lesion in her mid ascending colon.  Close to the tattoo site.  No obvious metastatic disease on visceral parietal peritoneum or liver.  It is an ileocolonic anastomosis that rests in the left upper quadrant.  9 mm elevated atypical nevus excised at left lower quadrant anterior abdominal wall skin.  Within the prior abdominoplasty/TRAM flap incision.  DESCRIPTION:   Informed consent was confirmed.  The patient underwent general anaesthesia without difficulty.  The patient was positioned with arms tucked & secured appropriately.  VTE prevention in place.  The patient's abdomen was clipped, prepped, & draped in a sterile fashion.  Surgical timeout confirmed our plan.  The patient was positioned in reverse Trendelenburg.  Abdominal entry was gained using Veress needle technique using trach hook on the anterior abdominal wall fascia for countertension in the  left upper abdomen.  Entry was clean.  I induced carbon dioxide insufflation.  Camera inspection revealed no injury.  Extra ports were carefully placed under direct laparoscopic visualization.  We docked the [employer] Vinci robot carefully and placed intstruments under visualization  I mobilized & reflected the greater omentum and small bowel in the upper abdomen.   Omental adhesions to the right colon.  Hepatic flexure mesentery  somewhat full upon itself.  This was gradually mobilized and opened up to get a better sense of the anatomy.  I was able to elevate the proximal colon to isolate the ileocolonic pedicle.  I scored the ileal  mesentery just proximal to that.   I carried that further dissection in a medial to lateral fashion.  I was able to bluntly get into the retro-mesenteric plane on the right side.  I freed the proximal right sided colonic mesentery off the retroperitoneum including the duodenal sweep, pancreatic head, & Gerota's fascia of the right kidney. I was able to get underneath the hepatic flexure.  I was able to get underneath the proximal and mid transverse colon.  I isolated the proximal ileocecal pedicle.  I skeletonized it & transected the vessels.  Elevated and mobilized the the mid transverse colon mesentery off the duodenal sweep and pancreatic head carefully.  I then proceeded to mobilize the terminal ileum & proximal "right" colon in a lateral to medial fashion.  I mobilized the distal ileal mesentery off its retroperitoneal and pelvic attachments.  I mobilized the ascending colon off It is side wall attachments to the paracolic gutter and retroperitoneum.  I also mobilized the greater omentum off the mid transverse colon and mobilized the mid to proximal transverse colon in a superior to inferior fashion.  This allowed me to mobilize the hepatic flexure and get a complete mobilization of the proximal "right" colon to the mid-transverse colon..  I could isolate the pathology. When he went ahead and proceeded with transection.  I transected in the distal mesentery radially and then transected the ileum with a robotic stapler.  Had elevated the transverse colon mesentery.  Transected the proximal and mid transverse colon just proximal to a dominant middle colic arterial pedicle.  About the level of the falciform ligament.  I then transected at the mid transverse colon with a robotic stapler.   We assured hemostasis.   I did a side-to-side stapled anastomosis of ileum to mid-transverse colon using a 26m robotic stapler x 2 firings in an isoperistaltic fashion.  (End of ileum to mid transverse colon.  Proximal end  of colon stump to more proximal ileum).  I sewed the common staple channel wound with an absorbable suture ( 2-0 V-lock) in a running canal fashion from each corner and meeting in the center.  I did meticulous inspection prove an airtight closure.  I protected the TX staple line with an omentopexy of greater omentum.  We did reinspection of the abdomen.  Hemostasis was good.   Ureters, retroperitoneum, and bowel uninjured.  The anastomosis looked healthy.   We did a final irrigation of antibiotic solution (900 mg clindamycin/240 mg gentamicin in a liter of crystalloid) & held that while we placed the wound protector through a periumbilical midline incision.  Specimen removed without incident.   Ports and wound protector removed.  Sterile unused instruments were used from this point out per colon SSI prevention protocol.  Fascia at the 120mport & extraction site closed with PDS suture.   Patient had many actinic keratoses and seborrheic keratoses.  She did have an atypical 9 mm nevus right at the extraction incision.  Was more rounded and elevated than a typical waxy keratoses studded on her abdomen.  Therefore I removed it and sent for pathology.  Port site skin closed using Monocryl stitch and sterile dressing.  I closed the skin of the extraction incision with some interrupted Monocryl stitches. I placed wicks  in between those areas. I placed sterile dressing.   Patient is being extubated go to recovery room. I discussed postop care with the patient in detail the office & in the holding area. Instructions are written.  I updated the patient's status to the family.  Recommendations were made.  Questions were answered.  The family expressed understanding & appreciation.  [name], M.D., F.A.C.S. Gastrointestinal and Minimally Invasive Surgery [facility] [address], [place], [place] [address] [phone] Main / Paging

## 2016-03-07 ENCOUNTER — Encounter (HOSPITAL_COMMUNITY): Payer: Self-pay | Admitting: Surgery

## 2016-03-07 DIAGNOSIS — D225 Melanocytic nevi of trunk: Secondary | ICD-10-CM

## 2016-03-07 LAB — BASIC METABOLIC PANEL
ANION GAP: 4 — AB (ref 5–15)
BUN: 13 mg/dL (ref 6–20)
CO2: 28 mmol/L (ref 22–32)
Calcium: 9 mg/dL (ref 8.9–10.3)
Chloride: 108 mmol/L (ref 101–111)
Creatinine, Ser: 0.79 mg/dL (ref 0.44–1.00)
GFR calc non Af Amer: >60 mL/min (ref >60)
GLUCOSE: 127 mg/dL — AB (ref 65–99)
POTASSIUM: 4.3 mmol/L (ref 3.5–5.1)
Sodium: 140 mmol/L (ref 135–145)

## 2016-03-07 LAB — CBC
HEMATOCRIT: 23.2 % — AB (ref 36.0–46.0)
HEMOGLOBIN: 7.1 g/dL — AB (ref 12.0–15.0)
MCH: 24.5 pg — AB (ref 26.0–34.0)
MCHC: 30.6 g/dL (ref 30.0–36.0)
MCV: 80 fL (ref 78.0–100.0)
Platelets: 391 10*3/uL (ref 150–400)
RBC: 2.9 MIL/uL — AB (ref 3.87–5.11)
RDW: 16.2 % — AB (ref 11.5–15.5)
WBC: 7.3 10*3/uL (ref 4.0–10.5)

## 2016-03-07 LAB — MAGNESIUM: Magnesium: 1.9 mg/dL (ref 1.7–2.4)

## 2016-03-07 MED ORDER — SODIUM CHLORIDE 0.9% FLUSH: 3.0000 mL | INTRAVENOUS | Status: DC | PRN

## 2016-03-07 MED ORDER — LACTATED RINGERS IV BOLUS (SEPSIS): 1000.0000 mL | Freq: Three times a day (TID) | INTRAVENOUS | Status: DC | PRN

## 2016-03-07 MED ORDER — SODIUM CHLORIDE 0.9% FLUSH
3.0000 mL | Freq: Two times a day (BID) | INTRAVENOUS | Status: DC
  Administered 2016-03-07 (×2): 3 mL via INTRAVENOUS

## 2016-03-07 MED ORDER — LISINOPRIL 10 MG PO TABS
10.0000 mg | ORAL_TABLET | Freq: Every day | ORAL | Status: DC
  Administered 2016-03-07 – 2016-03-08 (×2): 10 mg via ORAL
  Filled 2016-03-07 (×2): qty 1

## 2016-03-07 MED ORDER — SODIUM CHLORIDE 0.9 % IV SOLN: 250.0000 mL | INTRAVENOUS | Status: DC | PRN

## 2016-03-07 NOTE — Progress Notes (Signed)
[facility]  [name] [name], [place] [address] Phone: [phone] FAX: [phone]   Kristin [name] [id number] [birth year]  Problem List:   Principal Problem:   Malignant neoplasm of ascending colon s/p robotic colectomy 03/06/2016 Active Problems:   Esophageal reflux   Hypertension   Cancer of ascending colon (HCC)   Seborrheic keratoses of trunk   1 Day Post-Op    POST-OPERATIVE DIAGNOSIS:   Ascending colon cancer Atypical nevi  PROCEDURE:  XI ROBOT PROXIMAL COLECTOMY with ileocolonic anastomosis OMENTOPEXY Excision nevus   SURGEON: Surgeon(s): Michael Boston, MD  Assessment  Recovered rather well so far, postop day 1   Plan:  -adv diet gradually per protocol -f/u pathology -stop IVF.  PRN boluses -d/c foley -HTN control - lower usual dose w PRN backup -GERD controlled -VTE prophylaxis- SCDs, etc -mobilize as tolerated to help recovery  I updated the patient's status to the patient and family.  Recommendations were made.  Questions were answered.  They expressed understanding & appreciation.   [name], M.D., F.A.C.S. Gastrointestinal and Minimally Invasive Surgery [facility] [address], [place], [place] [address] [phone] Main / Paging   03/07/2016  Subjective:  Pain minimal control.  Walked already.  No nausea.  Tolerating liquids.  Drinking or coffee.  Daughter at bedside.  Objective:  Vital signs:  Filed Vitals:   03/06/16 2006 03/06/16 2231 03/07/16 0207 03/07/16 0620  BP: 153/49 135/48 123/45 151/55  Pulse: 68 66 62 63  Temp: 97.7 F (36.5 C) 98.4 F (36.9 C) 98.2 F (36.8 C) 98.1 F (36.7 C)  TempSrc: Oral Oral Oral Oral  Resp: 18 16 16 16   Height:      Weight:      SpO2: 100% 100% 100% 100%    Last BM Date: 03/05/16  Intake/Output   Yesterday:  05/17 0701 - 05/18 0700 In: 2346.7 [I.V.:2346.7] Out: 2836  [Urine:1725; Blood:50] This shift:  Total I/O In: 346.7 [I.V.:346.7] Out: 1500 [Urine:1500]  Bowel function:  Flatus: n  BM: n  Drain: n/a  Physical Exam:  General: Pt awake/alert/oriented x4 in no acute distress.  Alert.  Calm.  Smiling. Eyes: PERRL, normal EOM.  Sclera clear.  No icterus Neuro: CN II-XII intact w/o focal sensory/motor deficits. Lymph: No head/neck/groin lymphadenopathy Psych:  No delerium/psychosis/paranoia HENT: Normocephalic, Mucus membranes moist.  No thrush Neck: Supple, No tracheal deviation Chest: No chest wall pain w good excursion CV:  Pulses intact.  Regular rhythm MS: Normal AROM mjr joints.  No obvious deformity Abdomen: Soft.  Nondistended.  Mildly tender at incisions only.  No evidence of peritonitis.  No incarcerated hernias. Ext:  SCDs BLE.  No mjr edema.  No cyanosis Skin: No petechiae / purpura  Results:   Labs: Results for orders placed or performed during the hospital encounter of 03/06/16 (from the past 48 hour(s))  Glucose, capillary     Status: Abnormal   Collection Time: 03/06/16  2:10 PM  Result Value Ref Range   Glucose-Capillary 132 (H) 65 - 99 mg/dL   Comment 1 Notify RN    Comment 2 Document in Chart   Basic metabolic panel     Status: Abnormal   Collection Time: 03/07/16  4:34 AM  Result Value Ref Range   Sodium 140 135 - 145 mmol/L   Potassium 4.3 3.5 - 5.1 mmol/L   Chloride 108 101 - 111 mmol/L   CO2 28 22 - 32 mmol/L  Glucose, Bld 127 (H) 65 - 99 mg/dL   BUN 13 6 - 20 mg/dL   Creatinine, Ser 0.79 0.44 - 1.00 mg/dL   Calcium 9.0 8.9 - 10.3 mg/dL   GFR calc non Af Amer >60 >60 mL/min   GFR calc Af Amer >60 >60 mL/min    Comment: (NOTE) The eGFR has been calculated using the CKD EPI equation. This calculation has not been validated in all clinical situations. eGFR's persistently <60 mL/min signify possible Chronic Kidney Disease.    Anion gap 4 (L) 5 - 15  CBC     Status: Abnormal   Collection Time: 03/07/16   4:34 AM  Result Value Ref Range   WBC 7.3 4.0 - 10.5 K/uL   RBC 2.90 (L) 3.87 - 5.11 MIL/uL   Hemoglobin 7.1 (L) 12.0 - 15.0 g/dL   HCT 23.2 (L) 36.0 - 46.0 %   MCV 80.0 78.0 - 100.0 fL   MCH 24.5 (L) 26.0 - 34.0 pg   MCHC 30.6 30.0 - 36.0 g/dL   RDW 16.2 (H) 11.5 - 15.5 %   Platelets 391 150 - 400 K/uL  Magnesium     Status: None   Collection Time: 03/07/16  4:34 AM  Result Value Ref Range   Magnesium 1.9 1.7 - 2.4 mg/dL    Imaging / Studies: No results found.  Medications / Allergies: per chart  Antibiotics: Anti-infectives    Start     Dose/Rate Route Frequency Ordered Stop   03/06/16 2200  cefoTEtan (CEFOTAN) 2 g in dextrose 5 % 50 mL IVPB     2 g 100 mL/hr over 30 Minutes Intravenous Every 12 hours 03/06/16 1532 03/07/16 0041   03/06/16 1304  clindamycin (CLEOCIN) 900 mg, gentamicin (GARAMYCIN) 240 mg in sodium chloride 0.9 % 1,000 mL for intraperitoneal lavage  Status:  Discontinued       As needed 03/06/16 1304 03/06/16 1338   03/06/16 0825  cefoTEtan (CEFOTAN) 2 g in dextrose 5 % 50 mL IVPB     2 g 100 mL/hr over 30 Minutes Intravenous On call to O.R. 03/06/16 0825 03/06/16 1054   03/06/16 0600  clindamycin (CLEOCIN) 900 mg, gentamicin (GARAMYCIN) 240 mg in sodium chloride 0.9 % 1,000 mL for intraperitoneal lavage  Status:  Discontinued    Comments:  Pharmacy may adjust dosing strength, schedule, rate of infusion, etc as needed to optimize therapy    Intraperitoneal To Surgery 03/05/16 1332 03/06/16 1531        Note: Portions of this report may have been transcribed using voice recognition software. Every effort was made to ensure accuracy; however, inadvertent computerized transcription errors may be present.   Any transcriptional errors that result from this process are unintentional.     [name], M.D., F.A.C.S. Gastrointestinal and Minimally Invasive Surgery [facility] [address] [place], [place]  [address] [phone] Main / Paging   03/07/2016  CARE TEAM:  PCP: Eber Hong, MD  Outpatient Care Team: Patient Care Team: Eber Hong, MD as PCP - General (Internal Medicine) Michael Boston, MD as Consulting Physician (General Surgery) Manus Gunning, MD as Consulting Physician (Gastroenterology) Curt Bears, MD as Consulting Physician (Oncology) Herminio Commons, MD as Consulting Physician (Cardiology)  Inpatient Treatment Team: Treatment Team: Attending Provider: Michael Boston, MD; Technician: Leda Quail, NT; Registered Nurse: Cindy Hazy, RN   Note: Portions of this report may have been transcribed using voice recognition software. Every effort was made to  ensure accuracy; however, inadvertent computerized transcription errors may be present.   Any transcriptional errors that result from this process are unintentional.     [name], M.D., F.A.C.S. Gastrointestinal and Minimally Invasive Surgery [facility] [address] [place], [place] [address] [phone] Main / Paging

## 2016-03-08 NOTE — Discharge Summary (Signed)
Physician Discharge Summary  Patient ID: Carlise Stofer MRN: [id number] DOB/AGE: [birth year] 75 y.o.  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 03/06/2016 Active Problems:   Esophageal reflux   Hypertension   Osteoporosis   Hypercholesterolemia   Seborrheic keratoses of trunk   Melanocytic nevi of trunk   Discharge Diagnoses:  Principal Problem:   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  Postoperatively, the patient gradually mobilized and advanced to a solid diet.  Pain and other symptoms were treated aggressively.    By the time of discharge, the patient was walking well the hallways, eating food, having flatus.  Pain was well-controlled on an oral medications.  Based on meeting discharge criteria and continuing to recover, I felt it was safe for the patient to be discharged from the hospital to further recover with close followup. Postoperative recommendations were discussed in detail with pt & her husband.  They are written as well.  RN staff agree saffe to d/c home with family helping.   Significant  Diagnostic Studies:  Results for orders placed or performed during the hospital encounter of 03/06/16 (from the past 72 hour(s))  Glucose, capillary     Status: Abnormal   Collection Time: 03/06/16  2:10 PM  Result Value Ref Range   Glucose-Capillary 132 (H) 65 - 99 mg/dL   Comment 1 Notify RN    Comment 2 Document in Chart   Basic metabolic panel     Status: Abnormal   Collection Time: 03/07/16  4:34 AM  Result Value Ref Range   Sodium 140 135 - 145 mmol/L   Potassium 4.3 3.5 - 5.1 mmol/L   Chloride 108 101 - 111 mmol/L   CO2 28 22 - 32 mmol/L   Glucose, Bld 127 (H) 65 - 99 mg/dL   BUN 13 6 - 20 mg/dL   Creatinine, Ser 0.79 0.44 - 1.00 mg/dL   Calcium 9.0 8.9 - 10.3 mg/dL   GFR calc non Af Amer >60 >60 mL/min   GFR calc Af Amer >60 >60 mL/min    Comment: (NOTE) The eGFR has been calculated using the CKD EPI equation. This calculation has not been validated in all clinical situations. eGFR's persistently <60 mL/min signify possible Chronic Kidney Disease.    Anion gap 4 (L) 5 - 15  CBC     Status: Abnormal   Collection Time: 03/07/16  4:34 AM  Result Value Ref Range   WBC 7.3 4.0 - 10.5 K/uL   RBC 2.90 (L) 3.87 - 5.11 MIL/uL   Hemoglobin 7.1 (L) 12.0 - 15.0 g/dL   HCT 23.2 (L) 36.0 - 46.0 %   MCV 80.0 78.0 - 100.0 fL   MCH 24.5 (L) 26.0 - 34.0 pg  MCHC 30.6 30.0 - 36.0 g/dL   RDW 16.2 (H) 11.5 - 15.5 %   Platelets 391 150 - 400 K/uL  Magnesium     Status: None   Collection Time: 03/07/16  4:34 AM  Result Value Ref Range   Magnesium 1.9 1.7 - 2.4 mg/dL    No results found.  Discharge Exam: Blood pressure 146/53, pulse 65, temperature 97.8 F (36.6 C), temperature source Oral, resp. rate 15, height 5' 7"  (1.702 m), weight 61.825 kg (136 lb 4.8 oz), SpO2 100 %.  General: Pt awake/alert/oriented x4 in no major acute distress.  Walking in room with cane. Husband in room Eyes: PERRL, normal EOM. Sclera nonicteric Neuro: CN II-XII intact w/o focal sensory/motor  deficits. Lymph: No head/neck/groin lymphadenopathy Psych:  No delerium/psychosis/paranoia HENT: Normocephalic, Mucus membranes moist.  No thrush Neck: Supple, No tracheal deviation Chest: No pain.  Good respiratory excursion. CV:  Pulses intact.  Regular rhythm MS: Normal AROM mjr joints.  No obvious deformity Abdomen: Soft, Nondistended.  Min tender.  No incarcerated hernias.  Dressings removed Ext:  SCDs BLE.  No significant edema.  No cyanosis Skin: No petechiae / purpura  Discharged Condition: good   Past Medical History  Diagnosis Date  . Hypertension   . Anemia   . Tubular adenoma of colon 07/2011    colon polyps ans reoccurence with malignancy found  . Blood transfusion without reported diagnosis     transfusion- 3-4 yrs ago -found to be anemic on routine lab check  . GERD (gastroesophageal reflux disease)   . Stroke [facility])     2 yrs ago-no residual  . Breast cancer ([place]) 2002    bilateral- bilateral mastectomies done.  . Lung cancer ([place]) dx'd 12/2010    last Ct scan "no lung cancer" showing 11'16 CT Chest Epic.  . Malignant neoplasm of ascending colon ([place]) 01/29/2016  . Anxiety   . Arthritis     arthritis- left hip  . Family history of colon cancer 10/06/2009  . 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 CVA (cerebral infarction) 04/30/2013  . History of bilateral mastectomy 05/20/2010    Past Surgical History  Procedure Laterality Date  . Appendectomy    . Mastectomy Bilateral   . Lung surgery      Resection -" not done. Pt . tx with Tarceva  . Cataracts Bilateral   . Colonoscopy    . Tonsillectomy    . Tee without cardioversion N/A 06/04/2013    Procedure: TRANSESOPHAGEAL ECHOCARDIOGRAM (TEE);  Surgeon: Jolaine Artist, MD;  Location: [facility] ENDOSCOPY;  Service: Cardiovascular;  Laterality: N/A;  . Reconstruction breast w/ tram flap Bilateral     bilaterally    Social History   Social History  . Marital Status: Married    Spouse Name: N/A  . Number of Children:  2  . Years of Education: college   Occupational History  . Retired    Social History Main Topics  . Smoking status: Never Smoker   . Smokeless tobacco: Never Used  . Alcohol Use: 0.0 oz/week    0 Standard drinks or equivalent per week     Comment: ocassional beer  . Drug Use: No  . Sexual Activity: Not Currently   Other Topics Concern  . Not on file   Social History Narrative   Patient lives at home with her husband. Patient drinks caffinated  Drinks daily.    Family History  Problem Relation Age of Onset  . Colon cancer Sister   .  Lung cancer Father   . Heart disease Mother   . Multiple myeloma Paternal Grandmother   . Brain cancer Paternal Uncle   . Cancer Paternal Aunt     unknown  . Colon cancer Maternal Aunt   . Prostate cancer Maternal Uncle   . Other Maternal Uncle     brain tumor  . Lung cancer Maternal Uncle   . Stomach cancer Paternal Aunt   . Clotting disorder Mother 4    caronary thrombosis    Current Facility-Administered Medications  Medication Dose Route Frequency Provider Last Rate Last Dose  . 0.9 %  sodium chloride infusion  250 mL Intravenous PRN Michael Boston, MD      . acetaminophen (TYLENOL) tablet 1,000 mg  1,000 mg Oral TID Michael Boston, MD   1,000 mg at 03/07/16 2117  . alum & mag hydroxide-simeth (MAALOX/MYLANTA) 200-200-20 MG/5ML suspension 30 mL  30 mL Oral Q6H PRN Michael Boston, MD      . amLODipine (NORVASC) tablet 5 mg  5 mg Oral Daily Michael Boston, MD   5 mg at 03/07/16 1032  . brimonidine (ALPHAGAN) 0.2 % ophthalmic solution 1 drop  1 drop Right Eye BID Minda Ditto, RPH   1 drop at 03/07/16 2117   And  . timolol (TIMOPTIC) 0.5 % ophthalmic solution 1 drop  1 drop Right Eye BID Minda Ditto, RPH   1 drop at 03/07/16 2118  . calcium-vitamin D (OSCAL WITH D) 500-200 MG-UNIT per tablet 2 tablet  2 tablet Oral Daily Michael Boston, MD   2 tablet at 03/07/16 1031  . cycloSPORINE (RESTASIS) 0.05 % ophthalmic emulsion 1 drop  1 drop Both  Eyes BID Michael Boston, MD   1 drop at 03/07/16 2118  . diphenhydrAMINE (BENADRYL) 12.5 MG/5ML elixir 12.5 mg  12.5 mg Oral Q6H PRN Michael Boston, MD       Or  . diphenhydrAMINE (BENADRYL) injection 12.5 mg  12.5 mg Intravenous Q6H PRN Michael Boston, MD      . enoxaparin (LOVENOX) injection 40 mg  40 mg Subcutaneous Q24H Michael Boston, MD   40 mg at 03/07/16 0753  . escitalopram (LEXAPRO) tablet 10 mg  10 mg Oral Daily Michael Boston, MD   10 mg at 03/07/16 1031  . famotidine (PEPCID) tablet 40 mg  40 mg Oral Daily Michael Boston, MD   40 mg at 03/07/16 1031  . HYDROmorphone (DILAUDID) injection 0.5-2 mg  0.5-2 mg Intravenous Q1H PRN Michael Boston, MD   1 mg at 03/07/16 0650  . ibuprofen (ADVIL,MOTRIN) tablet 200 mg  200 mg Oral Q6H PRN Michael Boston, MD      . lactated ringers bolus 1,000 mL  1,000 mL Intravenous Q8H PRN Michael Boston, MD      . lactated ringers bolus 1,000 mL  1,000 mL Intravenous Q8H PRN Michael Boston, MD      . letrozole [facility]) tablet 2.5 mg  2.5 mg Oral Daily Michael Boston, MD   2.5 mg at 03/07/16 1032  . lip balm (CARMEX) ointment 1 application  1 application Topical BID Michael Boston, MD   1 application at 28/63/81 2200  . lisinopril (PRINIVIL,ZESTRIL) tablet 10 mg  10 mg Oral Daily Michael Boston, MD   10 mg at 03/07/16 1031  . magic mouthwash  15 mL Oral QID PRN Michael Boston, MD      . menthol-cetylpyridinium (CEPACOL) lozenge 3 mg  1 lozenge Oral PRN Michael Boston, MD      .  metoprolol (LOPRESSOR) injection 5 mg  5 mg Intravenous Q6H PRN Michael Boston, MD      . phenol (CHLORASEPTIC) mouth spray 2 spray  2 spray Mouth/Throat PRN Michael Boston, MD      . prochlorperazine (COMPAZINE) injection 10 mg  10 mg Intravenous Q6H PRN Michael Boston, MD      . prochlorperazine (COMPAZINE) tablet 10 mg  10 mg Oral Q6H PRN Michael Boston, MD      . saccharomyces boulardii (FLORASTOR) capsule 250 mg  250 mg Oral BID Michael Boston, MD   250 mg at 03/07/16 2117  . sodium chloride flush (NS) 0.9 % injection  3 mL  3 mL Intravenous Q12H Michael Boston, MD   3 mL at 03/07/16 2200  . sodium chloride flush (NS) 0.9 % injection 3 mL  3 mL Intravenous PRN Michael Boston, MD      . vitamin C (ASCORBIC ACID) tablet 500 mg  500 mg Oral BID Michael Boston, MD   500 mg at 03/07/16 2117  . zolpidem (AMBIEN) tablet 5 mg  5 mg Oral QHS PRN Michael Boston, MD         Allergies  Allergen Reactions  . Doxycycline Nausea Only  . Sulfa Antibiotics Rash    Disposition: 01-Home or Self Care  Discharge Instructions    Call MD for:  extreme fatigue    Complete by:  As directed      Call MD for:  extreme fatigue    Complete by:  As directed      Call MD for:  hives    Complete by:  As directed      Call MD for:  hives    Complete by:  As directed      Call MD for:  persistant nausea and vomiting    Complete by:  As directed      Call MD for:  persistant nausea and vomiting    Complete by:  As directed      Call MD for:  redness, tenderness, or signs of infection (pain, swelling, redness, odor or green/yellow discharge around incision site)    Complete by:  As directed      Call MD for:  redness, tenderness, or signs of infection (pain, swelling, redness, odor or green/yellow discharge around incision site)    Complete by:  As directed      Call MD for:  severe uncontrolled pain    Complete by:  As directed      Call MD for:  severe uncontrolled pain    Complete by:  As directed      Call MD for:    Complete by:  As directed   Temperature > 101.662F     Call MD for:    Complete by:  As directed   Temperature > 101.662F     Diet - low sodium heart healthy    Complete by:  As directed   Start with bland, low residue diet for a few days, then advance to a heart healthy (low fat, high fiber) diet.  If you feel nauseated or constipated, simplify to a liquid only diet for 48 hours until you are feeling better (no more nausea, farting/passing gas, having a bowel movement, etc...).  If you cannot tolerate even drinking  liquids, or feeling worse, let your surgeon know or go to the Emergency Department for help.     Diet - low sodium heart healthy    Complete by:  As directed  Start with bland, low residue diet for a few days, then advance to a heart healthy (low fat, high fiber) diet.  If you feel nauseated or constipated, simplify to a liquid only diet for 48 hours until you are feeling better (no more nausea, farting/passing gas, having a bowel movement, etc...).  If you cannot tolerate even drinking liquids, or feeling worse, let your surgeon know or go to the Emergency Department for help.     Discharge instructions    Complete by:  As directed   Please see discharge instruction sheets.   Also refer to any handouts/printouts that may have been given from the [facility] surgery office (if you visited [place] there before surgery) Please call our office if you have any questions or concerns [phone]     Discharge instructions    Complete by:  As directed   Please see discharge instruction sheets.   Also refer to any handouts/printouts that may have been given from the [facility] surgery office (if you visited [place] there before surgery) Please call our office if you have any questions or concerns [phone]     Discharge wound care:    Complete by:  As directed   If you have closed incisions: Shower and bathe over these incisions with soap and water every day.  It is OK to wash over the dressings: they are waterproof. Remove all surgical dressings on postoperative day #3.  You do not need to replace dressings over the closed incisions unless you feel more comfortable with a Band-Aid covering it.   If you have an open wound: That requires packing, so please see wound care instructions.   In general, remove all dressings, wash wound with soap and water and then replace with saline moistened gauze.  Do the dressing change at least every day.    Please call our office [phone] if you have further questions.      Discharge wound care:    Complete by:  As directed   If you have closed incisions: Shower and bathe over these incisions with soap and water every day.  It is OK to wash over the dressings: they are waterproof. Remove all surgical dressings on postoperative day #3.  You do not need to replace dressings over the closed incisions unless you feel more comfortable with a Band-Aid covering it.   If you have an open wound: That requires packing, so please see wound care instructions.   In general, remove all dressings, wash wound with soap and water and then replace with saline moistened gauze.  Do the dressing change at least every day.    Please call our office [phone] if you have further questions.     Driving Restrictions    Complete by:  As directed   No driving until off narcotics and can safely swerve away without pain during an emergency     Driving Restrictions    Complete by:  As directed   No driving until off narcotics and can safely swerve away without pain during an emergency     Increase activity slowly    Complete by:  As directed   Walk an hour a day.  Use 20-30 minute walks.  When you can walk 30 minutes without difficulty, it is fine to restart low impact/moderate activities such as biking, jogging, swimming, sexual activity, etc.  Eventually you can increase to unrestricted activity when not feeling pain.  If you feel pain: STOP!Marland Kitchen   Let pain protect you  from overdoing it.  Use ice/heat & over-the-counter pain medications to help minimize soreness.  If that is not enough, then use your narcotic pain prescription as needed to remain active.  It is better to take extra pain medications and be more active than to stay bedridden to avoid all pain medications.     Increase activity slowly    Complete by:  As directed   Walk an hour a day.  Use 20-30 minute walks.  When you can walk 30 minutes without difficulty, it is fine to restart low impact/moderate activities such as biking,  jogging, swimming, sexual activity, etc.  Eventually you can increase to unrestricted activity when not feeling pain.  If you feel pain: STOP!Marland Kitchen   Let pain protect you from overdoing it.  Use ice/heat & over-the-counter pain medications to help minimize soreness.  If that is not enough, then use your narcotic pain prescription as needed to remain active.  It is better to take extra pain medications and be more active than to stay bedridden to avoid all pain medications.     Lifting restrictions    Complete by:  As directed   Avoid heavy lifting initially, <20 pounds at first.   Do not push through pain.   You have no specific weight limit: If it hurts to do, DON'T DO IT.    If you feel no pain, you are not injuring anything.  Pain will protect you from injury.   Coughing and sneezing are far more stressful to your incision than any lifting.   Avoid resuming heavy lifting (>50 pounds) or other intense activity until off all narcotic pain medications.   When want to exercise more, give yourself 2 weeks to gradually get back to full intense exercise/activity.     Lifting restrictions    Complete by:  As directed   Avoid heavy lifting initially, <20 pounds at first.   Do not push through pain.   You have no specific weight limit: If it hurts to do, DON'T DO IT.    If you feel no pain, you are not injuring anything.  Pain will protect you from injury.   Coughing and sneezing are far more stressful to your incision than any lifting.   Avoid resuming heavy lifting (>50 pounds) or other intense activity until off all narcotic pain medications.   When want to exercise more, give yourself 2 weeks to gradually get back to full intense exercise/activity.     May shower / Bathe    Complete by:  As directed   [place].  It is fine for dressings or wounds to be washed/rinsed.  Use gentle soap & water.  This will help the incisions and/or wounds get clean & minimize infection.     May shower / Bathe     Complete by:  As directed   [place].  It is fine for dressings or wounds to be washed/rinsed.  Use gentle soap & water.  This will help the incisions and/or wounds get clean & minimize infection.     May walk up steps    Complete by:  As directed      May walk up steps    Complete by:  As directed      Sexual Activity Restrictions    Complete by:  As directed   Sexual activity as tolerated.  Do not push through pain.  Pain will protect you from injury.     Sexual Activity Restrictions  Complete by:  As directed   Sexual activity as tolerated.  Do not push through pain.  Pain will protect you from injury.     Walk with assistance    Complete by:  As directed   Walk over an hour a day.  May use a walker/cane/companion to help with balance and stamina.     Walk with assistance    Complete by:  As directed   Walk over an hour a day.  May use a walker/cane/companion to help with balance and stamina.            Medication List    STOP taking these medications        ibuprofen 200 MG tablet  Commonly known as:  ADVIL,MOTRIN      TAKE these medications        amLODipine 5 MG tablet  Commonly known as:  NORVASC  Take 5 mg by mouth daily.     brimonidine-timolol 0.2-0.5 % ophthalmic solution  Commonly known as:  COMBIGAN  Place 1 drop into the right eye every 12 (twelve) hours.     cefUROXime 250 MG tablet  Commonly known as:  CEFTIN  Take 250 mg by mouth 2 (two) times daily with a meal. She is to take for 10 days. She started on 02/14/16.     clindamycin 1 % lotion  Commonly known as:  CLEOCIN T  APPLY TWICE DAILY AS DIRECTED     clopidogrel 75 MG tablet  Commonly known as:  PLAVIX  Take 1 tablet (75 mg total) by mouth daily with breakfast.     cycloSPORINE 0.05 % ophthalmic emulsion  Commonly known as:  RESTASIS  Place 1 drop into both eyes 2 (two) times daily.     erlotinib 150 MG tablet  Commonly known as:  TARCEVA  Take 1 tablet (150 mg total) by mouth  daily. Take on an empty stomach 1 hour before meals or 2 hours after.     escitalopram 10 MG tablet  Commonly known as:  LEXAPRO  Take 10 mg by mouth daily.     famotidine 40 MG tablet  Commonly known as:  PEPCID  Take 40 mg by mouth daily.     FOLIVANE-PLUS Caps  TAKE 1 CAPSULE BY MOUTH DAILY     letrozole 2.5 MG tablet  Commonly known as:  FEMARA  TAKE ONE TABLET EACH DAY     lisinopril 20 MG tablet  Commonly known as:  PRINIVIL,ZESTRIL  Take 20 mg by mouth daily.     omeprazole 20 MG capsule  Commonly known as:  PRILOSEC  Take 1 capsule (20 mg total) by mouth daily.     oxyCODONE 5 MG immediate release tablet  Commonly known as:  Oxy IR/ROXICODONE  Take 1-2 tablets (5-10 mg total) by mouth every 6 (six) hours as needed for moderate pain, severe pain or breakthrough pain.     prochlorperazine 10 MG tablet  Commonly known as:  COMPAZINE  Take 1 tablet (10 mg total) by mouth every 6 (six) hours as needed for nausea or vomiting.     RA CALCIUM 600/VITAMIN D-3 600-400 MG-UNIT Tabs  Generic drug:  Calcium Carb-Cholecalciferol  Take 2 tablets by mouth daily.     zolpidem 5 MG tablet  Commonly known as:  AMBIEN  Take 5 mg by mouth at bedtime as needed for sleep.           Follow-up Information    Follow up with Adra Shepler C.,  MD. Schedule an appointment as soon as possible for a visit in 2 weeks.   Specialty:  General Surgery   Why:  To follow up after your operation, To follow up after your hospital stay   Contact information:   [place] [place] [address] [phone]        Signed: Morton Peters, M.D., F.A.C.S. Gastrointestinal and Minimally Invasive Surgery [facility] [address] [place], [place] [address] [phone] Main / Paging   03/08/2016, 7:59 AM

## 2016-03-12 ENCOUNTER — Ambulatory Visit: Payer: Medicare Other | Admitting: Internal Medicine

## 2016-03-13 ENCOUNTER — Ambulatory Visit: Payer: Medicare Other | Admitting: Internal Medicine

## 2016-03-13 ENCOUNTER — Other Ambulatory Visit: Payer: Medicare Other

## 2016-03-13 ENCOUNTER — Encounter: Payer: Medicare Other | Admitting: Genetic Counselor

## 2016-03-13 ENCOUNTER — Encounter: Payer: Self-pay | Admitting: Internal Medicine

## 2016-03-13 NOTE — Progress Notes (Signed)
Faxed [phone] application for asst with tarceva.

## 2016-03-27 ENCOUNTER — Encounter (HOSPITAL_COMMUNITY): Payer: Self-pay

## 2016-03-28 ENCOUNTER — Telehealth: Payer: Self-pay | Admitting: Pharmacist

## 2016-03-28 NOTE — Telephone Encounter (Signed)
Mrs. Flegal called today. She only has 1 Tarceva tablet remaining and was wondering when she would get her supply from the "new program." I called [employer] and was told that they did not have her application for assistance. Raquel faxed it on 5/24 and has a copy of the fax confirmation. She will refax the application today. We have 1 bottle of Tarceva 150 mg samples . I called Mrs. Tworek back and she will come and pick it up tomorrow.    Lot:  [id number]   Exp:  04/2018 She will call us in 1 week if she has not heard from [place] regarding the status of her application.

## 2016-03-31 ENCOUNTER — Ambulatory Visit: Payer: Self-pay | Admitting: Orthopedic Surgery

## 2016-04-01 ENCOUNTER — Encounter: Payer: Self-pay | Admitting: Internal Medicine

## 2016-04-01 NOTE — Progress Notes (Signed)
resent to [employer] 04/01/16

## 2016-04-08 ENCOUNTER — Telehealth: Payer: Self-pay | Admitting: Internal Medicine

## 2016-04-08 ENCOUNTER — Encounter: Payer: Self-pay | Admitting: Internal Medicine

## 2016-04-08 ENCOUNTER — Telehealth: Payer: Self-pay | Admitting: Pharmacist

## 2016-04-08 NOTE — Progress Notes (Signed)
Spoke with nicole at [facility] and no prior Josem Kaufmann is needed for tarceva- ref#  [id number],  I called [employer] to advise and they will reach out to patient to advise. Jader was not available for call. Collie Siad?? took message

## 2016-04-08 NOTE — Telephone Encounter (Signed)
pt called to r/s cx appt....pt ok and aware of new d.t

## 2016-04-08 NOTE — Telephone Encounter (Addendum)
Pt called to follow up on the status of her Tarceva Rx since she now has to get it from [place]. The first application for patient assistance to [employer] was never received. Raquel refaxed the application on 8/54/62. I followed up with Raquel. [employer] did receive the application and is now waiting on Prior Authorization. Raquel will complete PA today.  Pt notified of above information. Will f/u with prior authorization status on 04/15/16  Raul Del, PharmD, BCPS, [facility] [phone]

## 2016-04-16 ENCOUNTER — Telehealth: Payer: Self-pay

## 2016-04-16 NOTE — Telephone Encounter (Signed)
04/16/16 -  I contacted [employer] to inquire the status of Kristin [name] Drug assistance application.  I spoke with [place] with [employer] and provided the information needed from the physician and now they will reach out to the patient for  financial documents they need.  I contacted Kristin [place] to let her know the status and to be expecting a phone call from [place] and to let us know if she does not hear anything within the next few days.  Per my conversation with Loistine Simas  Kristin Demos should be approved once they receive proper financial documents.  We will follow up next week on the status.  Henreitta Leber, PharmD [facility] [phone]

## 2016-04-24 ENCOUNTER — Telehealth: Payer: Self-pay | Admitting: Pharmacist

## 2016-04-24 NOTE — Telephone Encounter (Signed)
Pt returned previous phone call. She has given Page 2 - of the tax document to her son-in-law who plans on faxing the requested information to  [place] (Tax document - page 2) to [place] tomorrow (04/25/16). There was a slight delay per patient due to the holiday.

## 2016-04-24 NOTE — Telephone Encounter (Signed)
[facility], Phone [phone] (option 1) to get update regarding pt application for drug assistance. Appears this patient was chosen for a random audit of financial information. 2016 tax information was requested. Pt did fax in the first page of the document but Celso Amy also needs the second page that has the signature on it and the name of who prepared the document. Celso Amy has left a message with the patient regarding the need for the second page of the tax document. Patient will need to fax the requested information to [phone]. [employer] did ship the [place] to the patient for the month of June.  Drug was shipped on 04/17/16 and delivered on 04/19/16. Pt has until 05/24/16 to send in the second page of the tax documents. If not received by that date, [employer] will no longer ship Tarceva. Message given to patient regarding this information.  Raul Del, PharmD, BCPS, [facility] [phone]

## 2016-04-25 ENCOUNTER — Other Ambulatory Visit (HOSPITAL_BASED_OUTPATIENT_CLINIC_OR_DEPARTMENT_OTHER): Payer: Medicare Other

## 2016-04-25 ENCOUNTER — Other Ambulatory Visit: Payer: Self-pay | Admitting: *Deleted

## 2016-04-25 ENCOUNTER — Ambulatory Visit (HOSPITAL_COMMUNITY)
Admission: RE | Admit: 2016-04-25 | Discharge: 2016-04-25 | Disposition: A | Discharge status: Home / Self Care | Payer: Medicare Other | Source: Ambulatory Visit | Attending: Internal Medicine | Admitting: Internal Medicine

## 2016-04-25 ENCOUNTER — Other Ambulatory Visit: Payer: Self-pay | Admitting: Internal Medicine

## 2016-04-25 ENCOUNTER — Telehealth: Payer: Self-pay | Admitting: Internal Medicine

## 2016-04-25 ENCOUNTER — Ambulatory Visit (HOSPITAL_BASED_OUTPATIENT_CLINIC_OR_DEPARTMENT_OTHER): Payer: Medicare Other | Admitting: Internal Medicine

## 2016-04-25 ENCOUNTER — Ambulatory Visit: Payer: Medicare Other

## 2016-04-25 VITALS — BP 164/48 | HR 68 | Temp 98.0°F | Resp 18 | Ht 67.0 in | Wt 128.9 lb

## 2016-04-25 DIAGNOSIS — M810 Age-related osteoporosis without current pathological fracture: Secondary | ICD-10-CM

## 2016-04-25 DIAGNOSIS — D649 Anemia, unspecified: Secondary | ICD-10-CM | POA: Insufficient documentation

## 2016-04-25 DIAGNOSIS — D509 Iron deficiency anemia, unspecified: Secondary | ICD-10-CM

## 2016-04-25 DIAGNOSIS — C182 Malignant neoplasm of ascending colon: Secondary | ICD-10-CM

## 2016-04-25 DIAGNOSIS — Z853 Personal history of malignant neoplasm of breast: Secondary | ICD-10-CM

## 2016-04-25 DIAGNOSIS — C3412 Malignant neoplasm of upper lobe, left bronchus or lung: Secondary | ICD-10-CM

## 2016-04-25 LAB — CBC WITH DIFFERENTIAL/PLATELET
BASO%: 0.6 % (ref 0.0–2.0)
Basophils Absolute: 0 10*3/uL (ref 0.0–0.1)
EOS ABS: 0.2 10*3/uL (ref 0.0–0.5)
EOS%: 4.4 % (ref 0.0–7.0)
HEMATOCRIT: 24.8 % — AB (ref 34.8–46.6)
HEMOGLOBIN: 7.5 g/dL — AB (ref 11.6–15.9)
LYMPH#: 0.9 10*3/uL (ref 0.9–3.3)
LYMPH%: 16.9 % (ref 14.0–49.7)
MCH: 21.1 pg — AB (ref 25.1–34.0)
MCHC: 30.2 g/dL — ABNORMAL LOW (ref 31.5–36.0)
MCV: 69.9 fL — AB (ref 79.5–101.0)
MONO#: 0.8 10*3/uL (ref 0.1–0.9)
MONO%: 15 % — ABNORMAL HIGH (ref 0.0–14.0)
NEUT#: 3.3 10*3/uL (ref 1.5–6.5)
NEUT%: 63.1 % (ref 38.4–76.8)
Platelets: 576 10*3/uL — ABNORMAL HIGH (ref 145–400)
RBC: 3.55 10*6/uL — ABNORMAL LOW (ref 3.70–5.45)
RDW: 17.5 % — AB (ref 11.2–14.5)
WBC: 5.3 10*3/uL (ref 3.9–10.3)

## 2016-04-25 LAB — COMPREHENSIVE METABOLIC PANEL
ALBUMIN: 3.3 g/dL — AB (ref 3.5–5.0)
ALK PHOS: 102 U/L (ref 40–150)
ALT: 13 U/L (ref 0–55)
AST: 15 U/L (ref 5–34)
Anion Gap: 6 mEq/L (ref 3–11)
BILIRUBIN TOTAL: 0.69 mg/dL (ref 0.20–1.20)
BUN: 17 mg/dL (ref 7.0–26.0)
CALCIUM: 9.5 mg/dL (ref 8.4–10.4)
CO2: 23 mEq/L (ref 22–29)
Chloride: 110 mEq/L — ABNORMAL HIGH (ref 98–109)
Creatinine: 0.8 mg/dL (ref 0.6–1.1)
EGFR: 68 mL/min/{1.73_m2} — AB (ref >90)
Glucose: 103 mg/dl (ref 70–140)
POTASSIUM: 4.4 meq/L (ref 3.5–5.1)
Sodium: 139 mEq/L (ref 136–145)
TOTAL PROTEIN: 6.4 g/dL (ref 6.4–8.3)

## 2016-04-25 MED ORDER — FOLIVANE-PLUS PO CAPS: ORAL_CAPSULE | ORAL | Status: DC

## 2016-04-25 NOTE — Progress Notes (Signed)
Orders entered, HAR called in.

## 2016-04-25 NOTE — Progress Notes (Signed)
[facility] [phone]   [phone]  OFFICE PROGRESS NOTE  Kristin Hong, MD [address] 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  2) history of breast cancer.  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   Current therapy:  1) Tarceva at 150 mg by mouth daily status post 64 months therapy.  2) Femara 2.5 mg by mouth daily.   CHEMOTHERAPY INTENT: palliative  CURRENT # OF CHEMOTHERAPY CYCLES: 62 CURRENT ANTIEMETICS: Compazine when necessary  CURRENT SMOKING STATUS: Never smoker  ORAL CHEMOTHERAPY AND CONSENT: Tarceva and Femara.  CURRENT BISPHOSPHONATES USE: None  PAIN MANAGEMENT: None  NARCOTICS INDUCED CONSTIPATION: None  LIVING WILL AND CODE STATUS: Full code initially but no prolonged resuscitation.   INTERVAL HISTORY: Kristin [name] 75 y.o. female returns to the clinic today for two-month follow up visit accompanied by her daughter Kristin [name].The patient is feeling fine today with no specific complaints except for increasing fatigue fatigue. She was found recently to have ascending colon adenocarcinoma. She underwent proximal colectomy with ileal colonic anastomosis under the care of Dr. Johney Maine on 03/06/2016. The final pathology  (Accession: [id number]) showed infiltrative colonic adenocarcinoma, grade 3 (4.0 cm). The carcinoma invades muscularis propria and on the resection margins were negative for malignancy. The dissected lymph nodes (n=15) were negative for malignancy. She is recovering well from her surgery except for the fatigue. She resumed her treatment with Tarceva and tolerating it well. She is currently on Femara.  The patient denied having any chest pain, shortness of breath, cough or hemoptysis. She denied having any rectal bleeding or black tarry stool. She  denied having any significant nausea or vomiting, no fever or chills. She has no weight loss or night sweats. She is here today for evaluation and discussion of her treatment options.  MEDICAL HISTORY: Past Medical History  Diagnosis Date  . Hypertension   . Anemia   . Tubular adenoma of colon 07/2011    colon polyps ans reoccurence with malignancy found  . Blood transfusion without reported diagnosis     transfusion- 3-4 yrs ago -found to be anemic on routine lab check  . GERD (gastroesophageal reflux disease)   . Stroke [facility])     2 yrs ago-no residual  . Breast cancer ([place]) 2002    bilateral- bilateral mastectomies done.  . Lung cancer ([place]) dx'd 12/2010    last Ct scan "no lung cancer" showing 11'16 CT Chest Epic.  . Malignant neoplasm of ascending colon ([place]) 01/29/2016  . Anxiety   . Arthritis     arthritis- left hip  . Family history of colon cancer 10/06/2009  . 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 CVA (cerebral infarction) 04/30/2013  . History of bilateral mastectomy 05/20/2010    ALLERGIES:  is allergic to doxycycline and sulfa antibiotics.  MEDICATIONS:  Current Outpatient Prescriptions  Medication Sig Dispense Refill  . amLODipine (NORVASC) 5 MG tablet Take 5 mg by mouth daily.      . brimonidine-timolol (COMBIGAN) 0.2-0.5 % ophthalmic solution Place 1 drop into the right eye every 12 (twelve) hours.    . Calcium Carb-Cholecalciferol (RA CALCIUM 600/VITAMIN D-3) 600-400 MG-UNIT TABS Take 2 tablets by mouth daily.      . clindamycin (CLEOCIN T) 1 % lotion APPLY TWICE DAILY AS DIRECTED (Patient taking differently: APPLY TWICE DAILY AS NEEDED FOR  PUS PLACES FROM TAKING TARCEVA.) 60 mL 1  . clopidogrel (PLAVIX) 75 MG tablet Take 1 tablet (75 mg total) by mouth daily with breakfast. 90 tablet 3  . cycloSPORINE (RESTASIS) 0.05 % ophthalmic emulsion Place 1 drop into both eyes 2 (two) times daily.    Marland Kitchen erlotinib (TARCEVA) 150 MG tablet Take 1 tablet (150 mg total) by mouth daily.  Take on an empty stomach 1 hour before meals or 2 hours after. 30 tablet 3  . escitalopram (LEXAPRO) 10 MG tablet Take 10 mg by mouth daily.     . famotidine (PEPCID) 40 MG tablet Take 40 mg by mouth daily.    Marland Kitchen FeFum-FePoly-FA-B Cmp-C-Biot (FOLIVANE-PLUS) CAPS TAKE 1 CAPSULE BY MOUTH DAILY 90 capsule 1  . lisinopril (PRINIVIL,ZESTRIL) 20 MG tablet Take 20 mg by mouth daily.     Marland Kitchen letrozole (FEMARA) 2.5 MG tablet TAKE ONE TABLET EACH DAY (Patient not taking: Reported on 04/25/2016) 30 tablet 2  . oxyCODONE (OXY IR/ROXICODONE) 5 MG immediate release tablet Take 1-2 tablets (5-10 mg total) by mouth every 6 (six) hours as needed for moderate pain, severe pain or breakthrough pain. (Patient not taking: Reported on 04/25/2016) 30 tablet 0  . zolpidem (AMBIEN) 5 MG tablet Take 5 mg by mouth at bedtime as needed for sleep. Reported on 04/25/2016    . [DISCONTINUED] erlotinib (TARCEVA) 150 MG tablet Take 1 tablet (150 mg total) by mouth daily. 30 tablet 2   No current facility-administered medications for this visit.    SURGICAL HISTORY:  Past Surgical History  Procedure Laterality Date  . Appendectomy    . Mastectomy Bilateral   . Lung surgery      Resection -" not done. Pt . tx with Tarceva  . Cataracts Bilateral   . Colonoscopy    . Tonsillectomy    . Tee without cardioversion N/A 06/04/2013    Procedure: TRANSESOPHAGEAL ECHOCARDIOGRAM (TEE);  Surgeon: Jolaine Artist, MD;  Location: [facility] ENDOSCOPY;  Service: Cardiovascular;  Laterality: N/A;  . Reconstruction breast w/ tram flap Bilateral     bilaterally    REVIEW OF SYSTEMS:  Constitutional: positive for fatigue Eyes: negative Ears, nose, mouth, throat, and face: negative Respiratory: negative Cardiovascular: negative Gastrointestinal: negative Genitourinary:negative Integument/breast: negative Hematologic/lymphatic: negative Musculoskeletal:negative Neurological: negative Behavioral/Psych: negative Endocrine:  negative Allergic/Immunologic: negative   PHYSICAL EXAMINATION: General appearance: alert, cooperative and no distress Head: Normocephalic, without obvious abnormality, atraumatic Neck: no adenopathy, no JVD and thyroid not enlarged, symmetric, no tenderness/mass/nodules Lymph nodes: Cervical, supraclavicular, and axillary nodes normal. Resp: clear to auscultation bilaterally and normal percussion bilaterally Back: symmetric, no curvature. ROM normal. No CVA tenderness. Cardio: regular rate and rhythm, S1, S2 normal, no murmur, click, rub or gallop GI: soft, non-tender; bowel sounds normal; no masses,  no organomegaly Extremities: extremities normal, atraumatic, no cyanosis or edema and Grade 2 skin rash on the lower extremities Neurologic: Alert and oriented X 3, normal strength and tone. Normal symmetric reflexes. Normal coordination and gait  ECOG PERFORMANCE STATUS: 1 - Symptomatic but completely ambulatory  Blood pressure 164/48, pulse 68, temperature 98 F (36.7 C), temperature source Oral, resp. rate 18, height 5' 7"  (1.702 m), weight 128 lb 14.4 oz (58.469 kg), SpO2 100 %.  LABORATORY DATA: Lab Results  Component Value Date   WBC 5.3 04/25/2016   HGB 7.5* 04/25/2016   HCT 24.8* 04/25/2016   MCV 69.9* 04/25/2016   PLT 576* 04/25/2016      Chemistry      Component  Value Date/Time   NA 140 03/07/2016 0434   NA 139 01/29/2016 1013   NA 147* 03/26/2012 1022   K 4.3 03/07/2016 0434   K 4.7 01/29/2016 1013   K 4.8* 03/26/2012 1022   CL 108 03/07/2016 0434   CL 107 04/05/2013 0754   CL 102 03/26/2012 1022   CO2 28 03/07/2016 0434   CO2 25 01/29/2016 1013   CO2 31 03/26/2012 1022   BUN 13 03/07/2016 0434   BUN 19.0 01/29/2016 1013   BUN 14 03/26/2012 1022   CREATININE 0.79 03/07/2016 0434   CREATININE 1.0 01/29/2016 1013   CREATININE 0.7 03/26/2012 1022      Component Value Date/Time   CALCIUM 9.0 03/07/2016 0434   CALCIUM 9.7 01/29/2016 1013   CALCIUM 9.1  03/26/2012 1022   ALKPHOS 94 01/29/2016 1013   ALKPHOS 80 05/31/2014 1058   ALKPHOS 84 03/26/2012 1022   AST 16 01/29/2016 1013   AST 18 05/31/2014 1058   AST 26 03/26/2012 1022   ALT 11 01/29/2016 1013   ALT 13 05/31/2014 1058   ALT 20 03/26/2012 1022   BILITOT 0.46 01/29/2016 1013   BILITOT 0.4 05/31/2014 1058   BILITOT 0.70 03/26/2012 1022        RADIOGRAPHIC STUDIES: No results found.  ASSESSMENT AND PLAN: this is a very pleasant 75 years old white female with:  1)  metastatic non-small cell lung cancer, adenocarcinoma with positive EGFR mutation: Currently on treatment with Tarceva status post 63 months of treatment. She has been off treatment during her surgery but the patient resumed treatment with Tarceva the last few weeks and treating it well The patient is feeling fine with no specific complaints and tolerating her treatment well. I recommended for the patient to continue her current treatment with Tarceva with the same dose.   2) Stage II colon adenocarcinoma status post proximal colectomy with lymph node dissection. I recommended for the patient to continue on observation for now. 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   3) For the history of the breast cancer, I recommended for the patient to resume her treatment with Femara 2.5 mg by mouth daily.  4) For the osteoporosis, the patient will continue on Prolia every 6 months.  5) iron deficiency anemia: I will arrange for the patient to receive 2 units of PRBCs transfusion in the next 2 days. I also recommended for the patient to resume her treatment with Integra +1 capsule by mouth daily.  She will come back for follow-up visit in one month for reevaluation with repeat blood work. The patient voices  understanding of current disease status and treatment options and is in agreement with the current care plan.  All questions were answered. The patient knows to call the clinic with any problems, questions or concerns. We can certainly see the patient much sooner if necessary.  Disclaimer: This note was dictated with voice recognition software. Similar sounding words can inadvertently be transcribed and may not be corrected upon review.

## 2016-04-25 NOTE — Telephone Encounter (Signed)
lvm for pt regarding to Aug appt... °

## 2016-04-26 ENCOUNTER — Ambulatory Visit (HOSPITAL_COMMUNITY)
Admission: RE | Admit: 2016-04-26 | Discharge: 2016-04-26 | Disposition: A | Discharge status: Home / Self Care | Payer: Medicare Other | Source: Ambulatory Visit | Attending: Internal Medicine | Admitting: Internal Medicine

## 2016-04-26 ENCOUNTER — Encounter: Payer: Self-pay | Admitting: Internal Medicine

## 2016-04-26 VITALS — BP 129/45 | HR 69 | Temp 97.6°F | Resp 20

## 2016-04-26 DIAGNOSIS — D649 Anemia, unspecified: Secondary | ICD-10-CM | POA: Diagnosis not present

## 2016-04-26 LAB — PREPARE RBC (CROSSMATCH)

## 2016-04-26 LAB — CEA: CEA1: 103.5 ng/mL — AB (ref 0.0–4.7)

## 2016-04-26 MED ORDER — SODIUM CHLORIDE 0.9% FLUSH: 10.0000 mL | INTRAVENOUS | Status: DC | PRN

## 2016-04-26 MED ORDER — HEPARIN SOD (PORK) LOCK FLUSH 100 UNIT/ML IV SOLN: 500.0000 [IU] | Freq: Every day | INTRAVENOUS | Status: DC | PRN

## 2016-04-26 MED ORDER — SODIUM CHLORIDE 0.9% FLUSH: 3.0000 mL | INTRAVENOUS | Status: DC | PRN

## 2016-04-26 MED ORDER — HEPARIN SOD (PORK) LOCK FLUSH 100 UNIT/ML IV SOLN: 250.0000 [IU] | INTRAVENOUS | Status: DC | PRN

## 2016-04-26 MED ORDER — DIPHENHYDRAMINE HCL 25 MG PO CAPS: 25.0000 mg | ORAL_CAPSULE | Freq: Once | ORAL | Status: DC

## 2016-04-26 MED ORDER — ACETAMINOPHEN 325 MG PO TABS
650.0000 mg | ORAL_TABLET | Freq: Once | ORAL | Status: AC
  Administered 2016-04-26: 650 mg via ORAL
  Filled 2016-04-26: qty 2

## 2016-04-26 MED ORDER — ACETAMINOPHEN 325 MG PO TABS: 650.0000 mg | ORAL_TABLET | Freq: Once | ORAL | Status: DC

## 2016-04-26 MED ORDER — SODIUM CHLORIDE 0.9 % IV SOLN
250.0000 mL | Freq: Once | INTRAVENOUS | Status: AC
  Administered 2016-04-26: 250 mL via INTRAVENOUS

## 2016-04-26 MED ORDER — SODIUM CHLORIDE 0.9 % IV SOLN: 250.0000 mL | Freq: Once | INTRAVENOUS | Status: DC

## 2016-04-26 MED ORDER — DIPHENHYDRAMINE HCL 25 MG PO CAPS
25.0000 mg | ORAL_CAPSULE | Freq: Once | ORAL | Status: AC
  Administered 2016-04-26: 25 mg via ORAL
  Filled 2016-04-26: qty 1

## 2016-04-26 NOTE — Progress Notes (Signed)
Patient ID: Kristin [name], female   DOB: Oct 26, 1940, 75 y.o.   MRN: [id number] Medical Provider: Curt Bears MD  Associated Diagnosis:Symptomatic anemia (285.9)  Procedure: Transfusion of 2 units PRBC's  Tolerated transfusion of 2 units  well. No adverse reactions. Went over discharge instructions and copy given to patient. Alert, oriented and ambulatory at time of discharge. Discharged home with daughter.

## 2016-04-26 NOTE — Discharge Instructions (Signed)
Blood Transfusion, Care After  These instructions give you information about caring for yourself after your procedure. Your doctor may also give you more specific instructions. Call your doctor if you have any problems or questions after your procedure.   HOME CARE   Take medicines only as told by your doctor. Ask your doctor if you can take an over-the-counter pain reliever if you have a fever or headache a day or two after your procedure.   Return to your normal activities as told by your doctor.  GET HELP IF:    You develop redness or irritation at your IV site.   You have a fever, chills, or a headache that does not go away.   Your pee (urine) is darker than normal.   Your urine turns:    Pink.    Red.    Brown.   The white part of your eye turns yellow (jaundice).   You feel weak after doing your normal activities.  GET HELP RIGHT AWAY IF:    You have trouble breathing.   You have fever and chills and you also have:    Anxiety.    Chest or back pain.    Flushed or pink skin.    Clammy or sweaty skin.    A fast heartbeat.    A sick feeling in your stomach (nausea).     This information is not intended to replace advice given to you by your health care provider. Make sure you discuss any questions you have with your health care provider.     Document Released: 10/28/2014 Document Reviewed: 10/28/2014  Elsevier Interactive Patient Education 2016 [employer]

## 2016-04-29 LAB — TYPE AND SCREEN
ABO/RH(D): B POS
Antibody Screen: NEGATIVE
UNIT DIVISION: 0
UNIT DIVISION: 0

## 2016-05-20 ENCOUNTER — Telehealth: Payer: Self-pay | Admitting: Pharmacist

## 2016-05-20 NOTE — Telephone Encounter (Signed)
Oral Chemotherapy Pharmacist Encounter  [facility] patient assistance at [phone] to inquire about receiving tax documentation to complete drug assistance application. Case #[id number] Per Isabell at [place], they have not received this required documentation and the case will be closed if not handled by 05/24/16  S/w patient who states son-in-law has faxed this information to [place] twice already. He is currently out of town and will not return until 8/1 or 8/2.  Pt instructed to send tax documentation to [facility] infusion pharmacy fax at [phone], and we will fax to [place]. Pt will put fax to my Denyse Amass) attention and I will ensure it gets to [place] at fax# [phone].  Pt understands and is in agreement with plan.  Johny Drilling, PharmD, BCPS [facility]

## 2016-05-23 ENCOUNTER — Telehealth: Payer: Self-pay | Admitting: Pharmacist

## 2016-05-23 ENCOUNTER — Encounter (HOSPITAL_COMMUNITY): Payer: Self-pay

## 2016-05-23 ENCOUNTER — Encounter: Payer: Self-pay | Admitting: Pharmacist

## 2016-05-23 NOTE — Telephone Encounter (Signed)
Oral Chemotherapy Pharmacist Encounter   S/w Jose at [place] patient assistance, they have received tax documents and now have all. of the financial information that they need to complete the application. Jose stated that they would be reaching out to patient to find out if she needs a Tarceva refill.  LVM for patient at [phone] to alert her that I had received her documents and that [employer] also confirmed they received these documents. Also told patient to expect call from [facility] about refill.  Johny Drilling, PharmD, BCPS [facility]

## 2016-05-23 NOTE — Progress Notes (Signed)
Oral Chemotherapy Pharmacist Encounter  Received tax documents from fax machine this morning, pages 1 and 2. Case #[id number] I faxed both pages of the tax documents to [place] at fax# [phone] Received confirmation of successful fax. I will call Celso Amy this afternoon to confirm receipt and then call patient to alert her documents are complete.  Johny Drilling, PharmD, BCPS [facility]

## 2016-05-30 ENCOUNTER — Other Ambulatory Visit: Payer: Medicare Other

## 2016-05-30 ENCOUNTER — Ambulatory Visit: Payer: Medicare Other | Admitting: Internal Medicine

## 2016-06-05 ENCOUNTER — Ambulatory Visit (HOSPITAL_BASED_OUTPATIENT_CLINIC_OR_DEPARTMENT_OTHER): Payer: Medicare Other | Admitting: Internal Medicine

## 2016-06-05 ENCOUNTER — Encounter: Payer: Self-pay | Admitting: Genetic Counselor

## 2016-06-05 ENCOUNTER — Other Ambulatory Visit (HOSPITAL_BASED_OUTPATIENT_CLINIC_OR_DEPARTMENT_OTHER): Payer: Medicare Other

## 2016-06-05 ENCOUNTER — Telehealth: Payer: Self-pay | Admitting: Internal Medicine

## 2016-06-05 ENCOUNTER — Encounter: Payer: Self-pay | Admitting: Internal Medicine

## 2016-06-05 ENCOUNTER — Ambulatory Visit (HOSPITAL_BASED_OUTPATIENT_CLINIC_OR_DEPARTMENT_OTHER): Payer: Self-pay | Admitting: Genetic Counselor

## 2016-06-05 VITALS — BP 166/55 | HR 69 | Temp 98.0°F | Resp 18 | Ht 67.0 in | Wt 131.5 lb

## 2016-06-05 DIAGNOSIS — Z315 Encounter for genetic counseling: Secondary | ICD-10-CM

## 2016-06-05 DIAGNOSIS — Z8042 Family history of malignant neoplasm of prostate: Secondary | ICD-10-CM

## 2016-06-05 DIAGNOSIS — Z808 Family history of malignant neoplasm of other organs or systems: Secondary | ICD-10-CM

## 2016-06-05 DIAGNOSIS — Z800 Family history of malignant neoplasm of digestive organs: Secondary | ICD-10-CM

## 2016-06-05 DIAGNOSIS — M810 Age-related osteoporosis without current pathological fracture: Secondary | ICD-10-CM | POA: Diagnosis not present

## 2016-06-05 DIAGNOSIS — C3412 Malignant neoplasm of upper lobe, left bronchus or lung: Secondary | ICD-10-CM

## 2016-06-05 DIAGNOSIS — C182 Malignant neoplasm of ascending colon: Secondary | ICD-10-CM | POA: Diagnosis not present

## 2016-06-05 DIAGNOSIS — D509 Iron deficiency anemia, unspecified: Secondary | ICD-10-CM | POA: Diagnosis not present

## 2016-06-05 DIAGNOSIS — Z803 Family history of malignant neoplasm of breast: Secondary | ICD-10-CM

## 2016-06-05 DIAGNOSIS — C3490 Malignant neoplasm of unspecified part of unspecified bronchus or lung: Secondary | ICD-10-CM

## 2016-06-05 DIAGNOSIS — Z853 Personal history of malignant neoplasm of breast: Secondary | ICD-10-CM | POA: Diagnosis not present

## 2016-06-05 LAB — COMPREHENSIVE METABOLIC PANEL
ALK PHOS: 111 U/L (ref 40–150)
ALT: 17 U/L (ref 0–55)
ANION GAP: 6 meq/L (ref 3–11)
AST: 20 U/L (ref 5–34)
Albumin: 3.4 g/dL — ABNORMAL LOW (ref 3.5–5.0)
BILIRUBIN TOTAL: 0.79 mg/dL (ref 0.20–1.20)
BUN: 19.2 mg/dL (ref 7.0–26.0)
CO2: 27 meq/L (ref 22–29)
Calcium: 10.1 mg/dL (ref 8.4–10.4)
Chloride: 108 mEq/L (ref 98–109)
Creatinine: 0.9 mg/dL (ref 0.6–1.1)
EGFR: 62 mL/min/{1.73_m2} — AB (ref >90)
GLUCOSE: 110 mg/dL (ref 70–140)
POTASSIUM: 4.2 meq/L (ref 3.5–5.1)
SODIUM: 140 meq/L (ref 136–145)
TOTAL PROTEIN: 6.9 g/dL (ref 6.4–8.3)

## 2016-06-05 LAB — CBC WITH DIFFERENTIAL/PLATELET
BASO%: 0.7 % (ref 0.0–2.0)
BASOS ABS: 0 10*3/uL (ref 0.0–0.1)
EOS ABS: 0.2 10*3/uL (ref 0.0–0.5)
EOS%: 3.8 % (ref 0.0–7.0)
HCT: 35.1 % (ref 34.8–46.6)
HGB: 11.3 g/dL — ABNORMAL LOW (ref 11.6–15.9)
LYMPH%: 21.6 % (ref 14.0–49.7)
MCH: 25.8 pg (ref 25.1–34.0)
MCHC: 32.2 g/dL (ref 31.5–36.0)
MCV: 80.1 fL (ref 79.5–101.0)
MONO#: 0.6 10*3/uL (ref 0.1–0.9)
MONO%: 14.6 % — AB (ref 0.0–14.0)
NEUT#: 2.5 10*3/uL (ref 1.5–6.5)
NEUT%: 59.3 % (ref 38.4–76.8)
PLATELETS: 424 10*3/uL — AB (ref 145–400)
RBC: 4.38 10*6/uL (ref 3.70–5.45)
WBC: 4.2 10*3/uL (ref 3.9–10.3)
lymph#: 0.9 10*3/uL (ref 0.9–3.3)
nRBC: 0 % (ref 0–0)

## 2016-06-05 LAB — TECHNOLOGIST REVIEW

## 2016-06-05 NOTE — Telephone Encounter (Signed)
GAVE PATIENT AVS REPORT AND APPOINTMENTS FOR October  °

## 2016-06-05 NOTE — Progress Notes (Signed)
REFERRING PROVIDER: Eber Hong, [place] [address]   Kristin Bears, MD  PRIMARY PROVIDER:  Eber Hong, MD  PRIMARY REASON FOR VISIT:  1. Personal history of breast cancer   2. Family history of breast cancer   3. Family history of colon cancer   4. Family history of brain cancer   5. Family history of prostate cancer   6. 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 female, was seen for a [place] cancer genetics consultation at the request of Dr. Brynda [name] due to a personal and family history of cancer.  Kristin [name] presents to clinic today to discuss the possibility of a hereditary predisposition to cancer, genetic testing, and to further clarify her future cancer risks, as well as potential cancer risks for family members.   In 2002, at the age of 24, Kristin [name] was diagnosed with bilateral breast cancer. This was treated with double mastectomy and tamoxifen.  In 2012, at the age of 77, Kristin [name] was diagnosed with non-small cell lung cancer. In 2017, at the age of 51, Kristin [name] was diagnosed with cancer of the ascending colon.  This was treated with surgery.  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   CANCER HISTORY:   No history exists.     HORMONAL RISK FACTORS:  Menarche was at age 51.  First live birth at age 23.  OCP use for approximately 1 years.  Ovaries intact: yes.  Hysterectomy: no.  Menopausal status: postmenopausal.  HRT use: 5 years. Colonoscopy: yes; abnormal. Mammogram within the last year: no, patient has had a dbl mastectomy. Number of breast biopsies: 1. Up to date with pelvic exams:  yes. Any excessive radiation exposure in the past:  no  Past Medical History:  Diagnosis Date  . Anemia   . Anxiety   . Arthritis    arthritis- left hip  . Blood transfusion without reported diagnosis    transfusion- 3-4 yrs ago -found to  be anemic on routine lab check  . Breast cancer ([place]) 2002   bilateral- bilateral mastectomies done.  . 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 CVA (cerebral infarction) 04/30/2013  . Family history of brain cancer   . Family history of breast cancer   . Family history of colon cancer 10/06/2009  . Family history of colon cancer   . Family history of prostate cancer   . GERD (gastroesophageal reflux disease)   . History of bilateral mastectomy 05/20/2010  . Hypertension   . Lung cancer ([place]) dx'd 12/2010   last Ct scan "no lung cancer" showing 11'16 CT Chest Epic.  . Malignant neoplasm of ascending colon ([place]) 01/29/2016  . Stroke [facility])    2 yrs ago-no residual  . Tubular adenoma of colon 07/2011   colon polyps ans reoccurence with malignancy found    Past Surgical History:  Procedure Laterality Date  . APPENDECTOMY    . cataracts Bilateral   . COLONOSCOPY    . LUNG SURGERY     Resection -" not done. Pt . tx with Tarceva  . MASTECTOMY Bilateral   . RECONSTRUCTION BREAST W/ TRAM FLAP Bilateral    bilaterally  . TEE WITHOUT CARDIOVERSION N/A 06/04/2013   Procedure: TRANSESOPHAGEAL ECHOCARDIOGRAM (TEE);  Surgeon: Jolaine Artist, MD;  Location: [facility] ENDOSCOPY;  Service: Cardiovascular;  Laterality: N/A;  . TONSILLECTOMY  Social History   Social History  . Marital status: Married    Spouse name: N/A  . Number of children: 2  . Years of education: college   Occupational History  . Retired Retired   Social History Main Topics  . Smoking status: Never Smoker  . Smokeless tobacco: Never Used  . Alcohol use 0.0 oz/week     Comment: ocassional beer  . Drug use: No  . Sexual activity: Not Currently   Other Topics Concern  . None   Social History Narrative   Patient lives at home with her husband. Patient drinks caffinated  Drinks daily.     FAMILY HISTORY:  We obtained a detailed, 4-generation family history.  Significant diagnoses are listed below: Family History   Problem Relation Age of Onset  . Lung cancer Father   . Heart disease Mother   . Clotting disorder Mother 84    caronary thrombosis  . Colon cancer Sister     dx in her 27s  . Breast cancer Sister 24  . Multiple myeloma Paternal Grandmother   . Brain cancer Paternal Uncle   . Cancer Paternal Aunt     unknown  . Colon cancer Maternal Aunt 80  . Prostate cancer Maternal Uncle     dx in his late 28s  . Other Maternal Uncle     brain tumor dx in his 10s  . Lung cancer Maternal Uncle   . Cancer Maternal Uncle     NOS  . Stomach cancer Paternal Aunt   . Brain cancer Cousin     maternal first cousin dx in late 77s-50s  . Cancer Paternal Uncle     NOS  . Colon cancer Cousin     paternal cousin dx in his 55s  . Cancer Cousin     paternal cousin dx with NOS cancer    The patient has two children who are cancer free.  Her sister was diagnosed with colon cancer in her 58's and breast cancer at 69.  The patient's mother died at 7 from a coronary thrombosis.  Her mother had three brothers and two sisters.  One sister had colon cancer at 17, another sister is alive at 37 but had a son die of a brain tumor.  One brother had prostate cancer, a second brother had a brain tumor and a third brother had a cancer NOS.  The patient's father had lung cancer in the face of smoking.  He had four brothers and two sisters.  One sister had stomach cancer and the other had a cancer NOS.  This sister had a son with a NOS cancer.  There was a brother who had a brain tumor and a brother with a cancer NOS and had a son with colon cancer.  Patient's ancestors are of [place], [place] and [place] descent. There is no reported Ashkenazi Jewish ancestry. There is no known consanguinity.  GENETIC COUNSELING ASSESSMENT: Kristin [name] is a 75 y.o. female with a personal and family history of cancer, and tumor testing which is somewhat suggestive of Lynch syndrome and predisposition to cancer. We, therefore, discussed and  recommended the following at today's visit.   DISCUSSION: We discussed that about 5-6% of colon cancer is hereditary with most cases due to Lynch syndrome.  About 5-10% of breast cancer is hereditary with most cases due to BRCA mutations.  Based on the patient's family history and tumor testing we are most concerned about Lynch syndrome.  We discussed her tumor  testing and that there was loss of MLH1 and PMS2.  This most commonly is associated with a sporadic cancer, as either a BRAF mutation or methylation has turned off the promoter region of the MLH1 gene.  However, the patient's family history has more cancer than would be expected, on both sides of her family.  Several of her family members have Lynch related cancers.  We discussed that the pattern of IHC loss may suggest a mutation in either PMS2 or MLH1.  Families with PMS2 mutations can have later onset colon cancer as well as breast cancer associated with the mutations.  Some families also have brain cancer, but again, there seems to be more brain cancer in the family than would be expected.  We reviewed the characteristics, features and inheritance patterns of hereditary cancer syndromes. We also discussed genetic testing, including the appropriate family members to test, the process of testing, insurance coverage and turn-around-time for results. We discussed the implications of a negative, positive and/or variant of uncertain significant result. We recommended Kristin [name] pursue genetic testing for the Common hereditary gene panel and the brain/nervous system gene panel. The Hereditary Gene Panel offered by [facility] includes sequencing and/or deletion duplication testing of the following 42 genes: APC, ATM, AXIN2, BARD1, BMPR1A, BRCA1, BRCA2, BRIP1, CDH1, CDKN2A, CHEK2, DICER1, EPCAM, GREM1, KIT, MEN1, MLH1, MSH2, MSH6, MUTYH, NBN, NF1, PALB2, PDGFRA, PMS2, POLD1, POLE, PTEN, RAD50, RAD51C, RAD51D, SDHA, SDHB, SDHC, SDHD, SMAD4, SMARCA4. STK11, TP53,  TSC1, TSC2, and VHL.  The CNS/Brain Cancer Panel offered by [facility] includes sequencing and/or deletion duplication testing of the following 42 genes: ALK, APC, BAP1, BARD1, CDK4, CDKN2A, DICER1, EPCAM, EZH2, GPC3, HRAS, KIF1B, MEN1, MLH1, MSH2, MSH6, NF1, NF2, PHOX2B, PMS2, POT1, PRKAR1A, PTCH2, PTEN, RB1, SMARCA4, SMARCB1, SMARCE1, SUFU, TP53, TSC1, TSC2, and VHL.  There are several genes that overlap and therefore would only be tested one time.  Based on Kristin [name]'s personal and family history of cancer, she meets medical criteria for genetic testing. Despite that she meets criteria, she may still have an out of pocket cost. We discussed that if her out of pocket cost for testing is over $100, the laboratory will call and confirm whether she wants to proceed with testing.  If the out of pocket cost of testing is less than $100 she will be billed by the genetic testing laboratory.   PLAN: After considering the risks, benefits, and limitations, Kristin [name]  provided informed consent to pursue genetic testing and the blood sample was sent to [facility] for analysis of the common hereditary gene panel and the brain tumor panel. Results should be available within approximately 2-3 weeks' time, at which point they will be disclosed by telephone to Kristin [name], as will any additional recommendations warranted by these results. Kristin [name] will receive a summary of her genetic counseling visit and a copy of her results once available. This information will also be available in Epic. We encouraged Kristin [name] to remain in contact with cancer genetics annually so that we can continuously update the family history and inform her of any changes in cancer genetics and testing that may be of benefit for her family. Kristin [name] questions were answered to her satisfaction today. Our contact information was provided should additional questions or concerns arise.  Lastly, we encouraged Kristin [name] to remain in contact  with cancer genetics annually so that we can continuously update the family history and inform her of any changes in cancer genetics and testing  that may be of benefit for this family.   Ms.  [name] questions were answered to her satisfaction today. Our contact information was provided should additional questions or concerns arise. Thank you for the referral and allowing [place] to share in the care of your patient.   Willmar Stockinger P. Florene [name], [place], [facility] Certified Genetic Counselor [email or web address] phone: [phone]  The patient was seen for a total of 45 minutes in face-to-face genetic counseling.  This patient was discussed with Drs. Magrinat, Lindi Adie and/or Burr Medico who agrees with the above.    _______________________________________________________________________ For Office Staff:  Number of people involved in session: 2 Was an Intern/ student involved with case: no

## 2016-06-05 NOTE — Progress Notes (Signed)
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  2) history of breast cancer.  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   Current therapy:  1) Tarceva at 150 mg by mouth daily status post 64 months therapy.  2) Femara 2.5 mg by mouth daily.   CHEMOTHERAPY INTENT: palliative  CURRENT # OF CHEMOTHERAPY CYCLES: 62 CURRENT ANTIEMETICS: Compazine when necessary  CURRENT SMOKING STATUS: Never smoker  ORAL CHEMOTHERAPY AND CONSENT: Tarceva and Femara.  CURRENT BISPHOSPHONATES USE: None  PAIN MANAGEMENT: None  NARCOTICS INDUCED CONSTIPATION: None  LIVING WILL AND CODE STATUS: Full code initially but no prolonged resuscitation.   INTERVAL HISTORY: Kristin [name] 75 y.o. female returns to the clinic today for two-month follow up visit accompanied by her daughter Kristin [name].The patient is much better today with no specific complaints. She is currently on treatment with Tarceva for metastatic lung cancer as well as for Marinol for history of breast cancer. The patient also recently diagnosed with colon cancer and currently on observation. Her fatigue has improved after receiving 2 units of PRBCs transfusion as well as oral iron tablets with Integra plus. The patient denied having any chest pain, shortness of breath, cough or hemoptysis. She denied having any rectal bleeding or black tarry stool. She denied having any significant nausea or vomiting, no fever or chills. She has no weight loss or night sweats. She is here today for evaluation with repeat blood work.  MEDICAL HISTORY: Past Medical History:  Diagnosis Date  . Anemia   . Anxiety   . Arthritis    arthritis- left hip  . Blood transfusion  without reported diagnosis    transfusion- 3-4 yrs ago -found to be anemic on routine lab check  . Breast cancer ([place]) 2002   bilateral- bilateral mastectomies done.  . 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 CVA (cerebral infarction) 04/30/2013  . Family history of brain cancer   . Family history of breast cancer   . Family history of colon cancer 10/06/2009  . Family history of colon cancer   . Family history of prostate cancer   . GERD (gastroesophageal reflux disease)   . History of bilateral mastectomy 05/20/2010  . Hypertension   . Lung cancer ([place]) dx'd 12/2010   last Ct scan "no lung cancer" showing 11'16 CT Chest Epic.  . Malignant neoplasm of ascending colon ([place]) 01/29/2016  . Stroke [facility])    2 yrs ago-no residual  . Tubular adenoma of colon 07/2011   colon polyps ans reoccurence with malignancy found    ALLERGIES:  is allergic to doxycycline and sulfa antibiotics.  MEDICATIONS:  Current Outpatient Prescriptions  Medication Sig Dispense Refill  . amLODipine (NORVASC) 5 MG tablet Take 5 mg by mouth daily.      . brimonidine-timolol (COMBIGAN) 0.2-0.5 % ophthalmic solution Place 1 drop into the right eye every 12 (twelve) hours.    . Calcium Carb-Cholecalciferol (RA CALCIUM 600/VITAMIN D-3) 600-400 MG-UNIT TABS Take 2 tablets by mouth daily.      . clindamycin (CLEOCIN T) 1 % lotion APPLY TWICE DAILY AS DIRECTED (Patient taking differently: APPLY TWICE DAILY AS NEEDED FOR PUS PLACES FROM TAKING TARCEVA.) 60 mL 1  . clopidogrel (PLAVIX) 75 MG tablet Take 1  tablet (75 mg total) by mouth daily with breakfast. 90 tablet 3  . cycloSPORINE (RESTASIS) 0.05 % ophthalmic emulsion Place 1 drop into both eyes 2 (two) times daily.    Marland Kitchen erlotinib (TARCEVA) 150 MG tablet Take 1 tablet (150 mg total) by mouth daily. Take on an empty stomach 1 hour before meals or 2 hours after. 30 tablet 3  . escitalopram (LEXAPRO) 10 MG tablet Take 10 mg by mouth daily.     . famotidine (PEPCID) 40 MG tablet Take  40 mg by mouth daily.    Marland Kitchen FeFum-FePoly-FA-B Cmp-C-Biot (FOLIVANE-PLUS) CAPS TAKE 1 CAPSULE BY MOUTH DAILY 90 capsule 1  . letrozole (FEMARA) 2.5 MG tablet TAKE ONE TABLET EACH DAY (Patient not taking: Reported on 04/25/2016) 30 tablet 2  . lisinopril (PRINIVIL,ZESTRIL) 20 MG tablet Take 20 mg by mouth daily.     Marland Kitchen oxyCODONE (OXY IR/ROXICODONE) 5 MG immediate release tablet Take 1-2 tablets (5-10 mg total) by mouth every 6 (six) hours as needed for moderate pain, severe pain or breakthrough pain. (Patient not taking: Reported on 04/25/2016) 30 tablet 0  . zolpidem (AMBIEN) 5 MG tablet Take 5 mg by mouth at bedtime as needed for sleep. Reported on 04/25/2016     No current facility-administered medications for this visit.     SURGICAL HISTORY:  Past Surgical History:  Procedure Laterality Date  . APPENDECTOMY    . cataracts Bilateral   . COLONOSCOPY    . LUNG SURGERY     Resection -" not done. Pt . tx with Tarceva  . MASTECTOMY Bilateral   . RECONSTRUCTION BREAST W/ TRAM FLAP Bilateral    bilaterally  . TEE WITHOUT CARDIOVERSION N/A 06/04/2013   Procedure: TRANSESOPHAGEAL ECHOCARDIOGRAM (TEE);  Surgeon: Jolaine Artist, MD;  Location: [facility] ENDOSCOPY;  Service: Cardiovascular;  Laterality: N/A;  . TONSILLECTOMY      REVIEW OF SYSTEMS:  A comprehensive review of systems was negative.   PHYSICAL EXAMINATION: General appearance: alert, cooperative and no distress Head: Normocephalic, without obvious abnormality, atraumatic Neck: no adenopathy, no JVD and thyroid not enlarged, symmetric, no tenderness/mass/nodules Lymph nodes: Cervical, supraclavicular, and axillary nodes normal. Resp: clear to auscultation bilaterally and normal percussion bilaterally Back: symmetric, no curvature. ROM normal. No CVA tenderness. Cardio: regular rate and rhythm, S1, S2 normal, no murmur, click, rub or gallop GI: soft, non-tender; bowel sounds normal; no masses,  no organomegaly Extremities: extremities  normal, atraumatic, no cyanosis or edema and Grade 2 skin rash on the lower extremities Neurologic: Alert and oriented X 3, normal strength and tone. Normal symmetric reflexes. Normal coordination and gait  ECOG PERFORMANCE STATUS: 1 - Symptomatic but completely ambulatory  There were no vitals taken for this visit.  LABORATORY DATA: Lab Results  Component Value Date   WBC 4.2 06/05/2016   HGB 11.3 (L) 06/05/2016   HCT 35.1 06/05/2016   MCV 80.1 06/05/2016   PLT 424 (H) 06/05/2016      Chemistry      Component Value Date/Time   NA 139 04/25/2016 1133   K 4.4 04/25/2016 1133   CL 108 03/07/2016 0434   CL 107 04/05/2013 0754   CO2 23 04/25/2016 1133   BUN 17.0 04/25/2016 1133   CREATININE 0.8 04/25/2016 1133      Component Value Date/Time   CALCIUM 9.5 04/25/2016 1133   ALKPHOS 102 04/25/2016 1133   AST 15 04/25/2016 1133   ALT 13 04/25/2016 1133   BILITOT 0.69 04/25/2016 1133  RADIOGRAPHIC STUDIES: No results found.  ASSESSMENT AND PLAN: this is a very pleasant 75 years old white female with:  1)  metastatic non-small cell lung cancer, adenocarcinoma with positive EGFR mutation: Currently on treatment with Tarceva status post 63 months of treatment. She has been off treatment during her surgery but the patient resumed treatment with Tarceva the last few weeks and treating it well The patient is feeling fine with no specific complaints and tolerating her treatment well. I recommended for the patient to continue her current treatment with Tarceva with the same dose.   2) Stage II colon adenocarcinoma status post proximal colectomy with lymph node dissection. I recommended for the patient to continue on observation for now. I also strongly consider the patient to have evaluation with the genetic counseling for evaluation of her risk for other malignancy as well as the risk for her children as the patient's tumor showed loss of the major and minor MMR proteins MLH1 and  PMS2.   3) For the history of the breast cancer, I recommended for the patient to resume her treatment with Femara 2.5 mg by mouth daily.  4) For the osteoporosis, the patient will continue on Prolia every 6 months.  5) iron deficiency anemia: I recommended for the patient to continue her treatment with Integra +1 capsule by mouth daily.  She will come back for follow-up visit in 2 months for reevaluation with repeat blood work. The patient voices understanding of current disease status and treatment options and is in agreement with the current care plan.  All questions were answered. The patient knows to call the clinic with any problems, questions or concerns. We can certainly see the patient much sooner if necessary.  Disclaimer: This note was dictated with voice recognition software. Similar sounding words can inadvertently be transcribed and may not be corrected upon review.

## 2016-06-20 ENCOUNTER — Encounter: Payer: Self-pay | Admitting: Genetic Counselor

## 2016-06-20 DIAGNOSIS — Z1379 Encounter for other screening for genetic and chromosomal anomalies: Secondary | ICD-10-CM | POA: Insufficient documentation

## 2016-06-21 ENCOUNTER — Telehealth: Payer: Self-pay | Admitting: Genetic Counselor

## 2016-06-21 ENCOUNTER — Ambulatory Visit: Payer: Self-pay | Admitting: Genetic Counselor

## 2016-06-21 DIAGNOSIS — Z803 Family history of malignant neoplasm of breast: Secondary | ICD-10-CM

## 2016-06-21 DIAGNOSIS — C182 Malignant neoplasm of ascending colon: Secondary | ICD-10-CM

## 2016-06-21 DIAGNOSIS — Z1379 Encounter for other screening for genetic and chromosomal anomalies: Secondary | ICD-10-CM

## 2016-06-21 DIAGNOSIS — Z853 Personal history of malignant neoplasm of breast: Secondary | ICD-10-CM

## 2016-06-21 DIAGNOSIS — Z8042 Family history of malignant neoplasm of prostate: Secondary | ICD-10-CM

## 2016-06-21 DIAGNOSIS — Z808 Family history of malignant neoplasm of other organs or systems: Secondary | ICD-10-CM

## 2016-06-21 DIAGNOSIS — Z800 Family history of malignant neoplasm of digestive organs: Secondary | ICD-10-CM

## 2016-06-21 NOTE — Progress Notes (Signed)
HPI: Kristin [name] was previously seen in the [place] clinic due to a personal history of three cancers, a strong family history of cancer and concerns regarding a hereditary predisposition to cancer. Please refer to our prior cancer genetics clinic note for more information regarding Kristin [name] medical, social and family histories, and our assessment and recommendations, at the time. Kristin [name] recent genetic test results were disclosed to her, as were recommendations warranted by these results. These results and recommendations are discussed in more detail below.  FAMILY HISTORY:  We obtained a detailed, 4-generation family history.  Significant diagnoses are listed below: Family History  Problem Relation Age of Onset  . Lung cancer Father   . Heart disease Mother   . Clotting disorder Mother 82    coronary thrombosis  . Colon cancer Sister     dx in her 6s  . Breast cancer Sister 77  . Multiple myeloma Paternal Grandmother   . Brain cancer Paternal Uncle   . Cancer Paternal Aunt     unknown  . Colon cancer Maternal Aunt 80  . Prostate cancer Maternal Uncle     dx in his late 71s  . Other Maternal Uncle     brain tumor dx in his 53s  . Lung cancer Maternal Uncle   . Cancer Maternal Uncle     NOS  . Stomach cancer Paternal Aunt   . Brain cancer Cousin     maternal first cousin dx in late 61s-50s  . Cancer Paternal Uncle     NOS  . Colon cancer Cousin     paternal cousin dx in his 32s  . Cancer Cousin     paternal cousin dx with NOS cancer    The patient has two children who are cancer free.  Her sister was diagnosed with colon cancer in her 40's and breast cancer at 76.  The patient's mother died at 75 from a coronary thrombosis.  Her mother had three brothers and two sisters.  One sister had colon cancer at 34, another sister is alive at 15 but had a son die of a brain tumor.  One brother had prostate cancer, a second brother had a brain tumor and a third  brother had a cancer NOS.  The patient's father had lung cancer in the face of smoking.  He had four brothers and two sisters.  One sister had stomach cancer and the other had a cancer NOS.  This sister had a son with a NOS cancer.  There was a brother who had a brain tumor and a brother with a cancer NOS and had a son with colon cancer.  Patient's ancestors are of [place], [place] and [place] descent. There is no reported Ashkenazi Jewish ancestry. There is no known consanguinity.  GENETIC TEST RESULTS: At the time of Kristin [name] visit, we recommended she pursue genetic testing of the Common Hereditary gene panel and brain tumor panel. The Hereditary Gene Panel and the CNS/Brain cancer panel offered by [facility] includes sequencing and/or deletion duplication testing of the following 42 genes: ALK, APC, ATM, AXIN2, BAP1, BARD1, BMPR1A, BRCA1, BRCA2, BRIP1, CDH1, CDKN2A, CHEK2, DICER1, EPCAM, GREM1, HRAS, KIT, MEN1, MLH1, MSH2, MSH6, MUTYH, NBN, NF1, NF2, PALB2, PDGFRA, PHOX2B, PMS2, POLD1, POLE,PRKAR1A, PTCH2,  PTEN, RAD50, RAD51C, RAD51D, RB1, SDHA, SDHB, SDHC, SDHD, SMAD4, SMARCA4. SMARCB1, SMARCE1, SUFU, STK11, TP53, TSC1, TSC2, and VHL.  The report date is June 19, 2016.   Genetic testing was normal, and did not  reveal a deleterious mutation in these genes. The test report has been scanned into EPIC and is located under the Molecular Pathology section of the Results Review tab.   We discussed with Kristin [name] that since the current genetic testing is not perfect, it is possible there may be a gene mutation in one of these genes that current testing cannot detect, but that chance is small. We also discussed, that it is possible that another gene that has not yet been discovered, or that we have not yet tested, is responsible for the cancer diagnoses in the family, and it is, therefore, important to remain in touch with cancer genetics in the future so that we can continue to offer Kristin [name] the most up to  date genetic testing.   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  At this time, it is unknown if this variant is associated with increased cancer risk or if this is a normal finding, but most variants such as this get reclassified to being inconsequential. It should not be used to make medical management decisions. With time, we suspect the lab will determine the significance of this variant, if any. If we do learn more about it, we will try to contact Kristin [name] to discuss it further. However, it is important to stay in touch with [place] periodically and keep the address and phone number up to date.     CANCER SCREENING RECOMMENDATIONS: Given Kristin [name]'s personal and family histories, we must interpret these negative results with some caution.  Families with features suggestive of hereditary risk for cancer tend to have multiple family members with cancer, diagnoses in multiple generations and diagnoses before the age of 31. Kristin [name] family exhibits some of these features. Thus this result may simply reflect our current inability to detect all mutations within these genes or there may be a different gene that has not yet been discovered or tested.   RECOMMENDATIONS FOR FAMILY MEMBERS: Women in this family might be at some increased risk of developing cancer, over the general population risk, simply due to the family history of cancer. We recommended women in this family have a yearly mammogram beginning at age 12, or 35 years younger than the earliest onset of cancer, an an annual clinical breast exam, and perform monthly breast self-exams. Women in this family should also have a gynecological exam as recommended by their primary provider. All family members should have a colonoscopy by age 87.  Based on Kristin [name] family history, we recommended her sister, who was diagnosed with colon cancer in her 79's and breast cancer at age 75, have genetic  counseling and testing. Kristin [name] will let us know if we can be of any assistance in coordinating genetic counseling and/or testing for this family member.   FOLLOW-UP: Lastly, we discussed with Kristin [name] that cancer genetics is a rapidly advancing field and it is possible that new genetic tests will be appropriate for her and/or her family members in the future. We encouraged her to remain in contact with cancer genetics on an annual basis so we can update her personal and family histories and let her know of advances in cancer genetics that may benefit this family.   Our contact number was provided. Kristin [name] questions were answered to her satisfaction, and she knows she is welcome to call us at anytime with additional questions or concerns.   Maylon Cos, MS, [facility] Certified Genetic Counselor  [email or web address]

## 2016-06-21 NOTE — Telephone Encounter (Signed)
Revealed negative genetic testing on a 52 gene panel looking at genes associated with breast, GYN, colon and brain tumors.  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  Also discussed that most individuals with NF2 develop acoustic neuromas, and are deaf.  Since she has not developed this, it makes this VUS even more clinically insignificant at this time. Patient voiced understanding.

## 2016-07-22 DIAGNOSIS — C50112 Malignant neoplasm of central portion of left female breast: Secondary | ICD-10-CM

## 2016-07-22 DIAGNOSIS — C50111 Malignant neoplasm of central portion of right female breast: Secondary | ICD-10-CM | POA: Insufficient documentation

## 2016-07-22 DIAGNOSIS — Z170 Estrogen receptor positive status [ER+]: Secondary | ICD-10-CM

## 2016-07-23 ENCOUNTER — Encounter: Payer: Self-pay | Admitting: Internal Medicine

## 2016-07-23 NOTE — Progress Notes (Signed)
Obtained signature from doctor for refill for Tarceva. Faxed to [facility]. Fax received ok per confirmation sheet.  Copy placed to be scanned by HIM.

## 2016-07-29 ENCOUNTER — Other Ambulatory Visit: Payer: Self-pay | Admitting: Internal Medicine

## 2016-07-30 NOTE — Patient Instructions (Addendum)
Kristin [name]  07/30/2016   Your procedure is scheduled on: 08/07/16  Report to [facility]  Entrance take [facility]  elevators to 3rd floor to  [place] at Center  Call this number if you have problems the morning of surgery [phone]   Remember: ONLY 1 PERSON MAY GO WITH YOU TO SHORT STAY TO GET  READY MORNING OF YOUR SURGERY.  Do not eat food :After Midnight.--- MAY HAVE CLEAR LIQUIDS MORNING OF SURGERY UNTIL 0800 AM--THEN NOTHING     Take these medicines the morning of surgery with A SIP OF WATER: Amlodipine, Lexapro, Pepcid, Femara DO NOT TAKE ANY DIABETIC MEDICATIONS DAY OF YOUR SURGERY                               You may not have any metal on your body including hair pins and              piercings  Do not wear jewelry, make-up, lotions, powders or perfumes, deodorant             Do not wear nail polish.  Do not shave  48 hours prior to surgery.              Men may shave face and neck.   Do not bring valuables to the hospital. [employer].  Contacts, dentures or bridgework may not be worn into surgery.  Leave suitcase in the car. After surgery it may be brought to your room.              [facility] - Preparing for Surgery Before surgery, you can play an important role.  Because skin is not sterile, your skin needs to be as free of germs as possible.  You can reduce the number of germs on your skin by washing with CHG (chlorahexidine gluconate) soap before surgery.  CHG is an antiseptic cleaner which kills germs and bonds with the skin to continue killing germs even after washing. Please DO NOT use if you have an allergy to CHG or antibacterial soaps.  If your skin becomes reddened/irritated stop using the CHG and inform your nurse when you arrive at Short Stay. Do not shave (including legs and underarms) for at least 48 hours prior to the first CHG shower.  You may shave your  face/neck. Please follow these instructions carefully:  1.  Shower with CHG Soap the night before surgery and the  morning of Surgery.  2.  If you choose to wash your hair, wash your hair first as usual with your  normal  shampoo.  3.  After you shampoo, rinse your hair and body thoroughly to remove the  shampoo.                           4.  Use CHG as you would any other liquid soap.  You can apply chg directly  to the skin and wash                       Gently with a scrungie or clean washcloth.  5.  Apply the CHG Soap to your body ONLY FROM THE NECK DOWN.  Do not use on face/ open                           Wound or open sores. Avoid contact with eyes, ears mouth and genitals (private parts).                       Wash face,  Genitals (private parts) with your normal soap.             6.  Wash thoroughly, paying special attention to the area where your surgery  will be performed.  7.  Thoroughly rinse your body with warm water from the neck down.  8.  DO NOT shower/wash with your normal soap after using and rinsing off  the CHG Soap.                9.  Pat yourself dry with a clean towel.            10.  Wear clean pajamas.            11.  Place clean sheets on your bed the night of your first shower and do not  sleep with pets. Day of Surgery : Do not apply any lotions/deodorants the morning of surgery.  Please wear clean clothes to the hospital/surgery center.  FAILURE TO FOLLOW THESE INSTRUCTIONS MAY RESULT IN THE CANCELLATION OF YOUR SURGERY PATIENT SIGNATURE_________________________________  NURSE SIGNATURE__________________________________  ________________________________________________________________________    CLEAR LIQUID DIET   Foods Allowed                                                                     Foods Excluded  Coffee and tea, regular and decaf                             liquids that you cannot  Plain Jell-O in any flavor                                              see through such as: Fruit ices (not with fruit pulp)                                     milk, soups, orange juice  Iced Popsicles                                    All solid food Carbonated beverages, regular and diet                                    Cranberry, grape and apple juices Sports drinks like Gatorade Lightly seasoned clear broth or consume(fat free) Sugar, honey syrup  Sample Menu Breakfast  Lunch                                     Supper Cranberry juice                    Beef broth                            Chicken broth Jell-O                                     Grape juice                           Apple juice Coffee or tea                        Jell-O                                      Popsicle                                                Coffee or tea                        Coffee or tea  _____________________________________________________________________    Incentive Spirometer  An incentive spirometer is a tool that can help keep your lungs clear and active. This tool measures how well you are filling your lungs with each breath. Taking long deep breaths may help reverse or decrease the chance of developing breathing (pulmonary) problems (especially infection) following:  A long period of time when you are unable to move or be active. BEFORE THE PROCEDURE   If the spirometer includes an indicator to show your best effort, your nurse or respiratory therapist will set it to a desired goal.  If possible, sit up straight or lean slightly forward. Try not to slouch.  Hold the incentive spirometer in an upright position. INSTRUCTIONS FOR USE  1. Sit on the edge of your bed if possible, or sit up as far as you can in bed or on a chair. 2. Hold the incentive spirometer in an upright position. 3. Breathe out normally. 4. Place the mouthpiece in your mouth and seal your lips tightly around it. 5. Breathe in slowly  and as deeply as possible, raising the piston or the ball toward the top of the column. 6. Hold your breath for 3-5 seconds or for as long as possible. Allow the piston or ball to fall to the bottom of the column. 7. Remove the mouthpiece from your mouth and breathe out normally. 8. Rest for a few seconds and repeat Steps 1 through 7 at least 10 times every 1-2 hours when you are awake. Take your time and take a few normal breaths between deep breaths. 9. The spirometer may include an indicator to show your best effort. Use the indicator as a goal to work toward during each repetition. 10. After each set of 10 deep breaths,  practice coughing to be sure your lungs are clear. If you have an incision (the cut made at the time of surgery), support your incision when coughing by placing a pillow or rolled up towels firmly against it. Once you are able to get out of bed, walk around indoors and cough well. You may stop using the incentive spirometer when instructed by your caregiver.  RISKS AND COMPLICATIONS  Take your time so you do not get dizzy or light-headed.  If you are in pain, you may need to take or ask for pain medication before doing incentive spirometry. It is harder to take a deep breath if you are having pain. AFTER USE  Rest and breathe slowly and easily.  It can be helpful to keep track of a log of your progress. Your caregiver can provide you with a simple table to help with this. If you are using the spirometer at home, follow these instructions: [place] IF:   You are having difficultly using the spirometer.  You have trouble using the spirometer as often as instructed.  Your pain medication is not giving enough relief while using the spirometer.  You develop fever of 100.5 F (38.1 C) or higher. SEEK IMMEDIATE MEDICAL CARE IF:   You cough up bloody sputum that had not been present before.  You develop fever of 102 F (38.9 C) or greater.  You develop  worsening pain at or near the incision site. MAKE SURE YOU:   Understand these instructions.  Will watch your condition.  Will get help right away if you are not doing well or get worse. Document Released: 02/17/2007 Document Revised: 12/30/2011 Document Reviewed: 04/20/2007 Exit[employer] Patient Information 2014 Exit[employer], [place].   ________________________________________________________________________  WHAT IS A BLOOD TRANSFUSION? Blood Transfusion Information  A transfusion is the replacement of blood or some of its parts. Blood is made up of multiple cells which provide different functions.  Red blood cells carry oxygen and are used for blood loss replacement.  White blood cells fight against infection.  Platelets control bleeding.  Plasma helps clot blood.  Other blood products are available for specialized needs, such as hemophilia or other clotting disorders. BEFORE THE TRANSFUSION  Who gives blood for transfusions?   Healthy volunteers who are fully evaluated to make sure their blood is safe. This is blood bank blood. Transfusion therapy is the safest it has ever been in the practice of medicine. Before blood is taken from a donor, a complete history is taken to make sure that person has no history of diseases nor engages in risky social behavior (examples are intravenous drug use or sexual activity with multiple partners). The donor's travel history is screened to minimize risk of transmitting infections, such as malaria. The donated blood is tested for signs of infectious diseases, such as HIV and hepatitis. The blood is then tested to be sure it is compatible with you in order to minimize the chance of a transfusion reaction. If you or a relative donates blood, this is often done in anticipation of surgery and is not appropriate for emergency situations. It takes many days to process the donated blood. RISKS AND COMPLICATIONS Although transfusion therapy is very safe and saves  many lives, the main dangers of transfusion include:   Getting an infectious disease.  Developing a transfusion reaction. This is an allergic reaction to something in the blood you were given. Every precaution is taken to prevent this. The decision to have a blood transfusion has been  considered carefully by your caregiver before blood is given. Blood is not given unless the benefits outweigh the risks. AFTER THE TRANSFUSION  Right after receiving a blood transfusion, you will usually feel much better and more energetic. This is especially true if your red blood cells have gotten low (anemic). The transfusion raises the level of the red blood cells which carry oxygen, and this usually causes an energy increase.  The nurse administering the transfusion will monitor you carefully for complications. HOME CARE INSTRUCTIONS  No special instructions are needed after a transfusion. You may find your energy is better. Speak with your caregiver about any limitations on activity for underlying diseases you may have. SEEK MEDICAL CARE IF:   Your condition is not improving after your transfusion.  You develop redness or irritation at the intravenous (IV) site. SEEK IMMEDIATE MEDICAL CARE IF:  Any of the following symptoms occur over the next 12 hours:  Shaking chills.  You have a temperature by mouth above 102 F (38.9 C), not controlled by medicine.  Chest, back, or muscle pain.  People around you feel you are not acting correctly or are confused.  Shortness of breath or difficulty breathing.  Dizziness and fainting.  You get a rash or develop hives.  You have a decrease in urine output.  Your urine turns a dark color or changes to pink, red, or brown. Any of the following symptoms occur over the next 10 days:  You have a temperature by mouth above 102 F (38.9 C), not controlled by medicine.  Shortness of breath.  Weakness after normal activity.  The white part of the eye turns  yellow (jaundice).  You have a decrease in the amount of urine or are urinating less often.  Your urine turns a dark color or changes to pink, red, or brown. Document Released: 10/04/2000 Document Revised: 12/30/2011 Document Reviewed: 05/23/2008 [facility] Patient Information 2014 [place], [place].  _______________________________________________________________________

## 2016-07-30 NOTE — Progress Notes (Signed)
lov and ekg dr Jacinta Shoe  4/17,eccho 4/17 epic,  Chest CT epic

## 2016-07-31 ENCOUNTER — Other Ambulatory Visit (HOSPITAL_BASED_OUTPATIENT_CLINIC_OR_DEPARTMENT_OTHER): Payer: Medicare Other

## 2016-07-31 ENCOUNTER — Ambulatory Visit (HOSPITAL_BASED_OUTPATIENT_CLINIC_OR_DEPARTMENT_OTHER): Payer: Medicare Other | Admitting: Internal Medicine

## 2016-07-31 ENCOUNTER — Encounter: Payer: Self-pay | Admitting: Internal Medicine

## 2016-07-31 ENCOUNTER — Encounter (HOSPITAL_COMMUNITY): Payer: Self-pay

## 2016-07-31 ENCOUNTER — Telehealth: Payer: Self-pay | Admitting: Internal Medicine

## 2016-07-31 ENCOUNTER — Encounter (HOSPITAL_COMMUNITY)
Admission: RE | Admit: 2016-07-31 | Discharge: 2016-07-31 | Disposition: A | Discharge status: Home / Self Care | Payer: Medicare Other | Source: Ambulatory Visit | Attending: Orthopedic Surgery | Admitting: Orthopedic Surgery

## 2016-07-31 VITALS — BP 137/46 | HR 63 | Temp 97.8°F | Resp 18 | Ht 67.0 in | Wt 132.9 lb

## 2016-07-31 DIAGNOSIS — C3412 Malignant neoplasm of upper lobe, left bronchus or lung: Secondary | ICD-10-CM

## 2016-07-31 DIAGNOSIS — M1612 Unilateral primary osteoarthritis, left hip: Secondary | ICD-10-CM | POA: Diagnosis not present

## 2016-07-31 DIAGNOSIS — Z853 Personal history of malignant neoplasm of breast: Secondary | ICD-10-CM

## 2016-07-31 DIAGNOSIS — D509 Iron deficiency anemia, unspecified: Secondary | ICD-10-CM | POA: Diagnosis not present

## 2016-07-31 DIAGNOSIS — M818 Other osteoporosis without current pathological fracture: Secondary | ICD-10-CM

## 2016-07-31 DIAGNOSIS — C182 Malignant neoplasm of ascending colon: Secondary | ICD-10-CM

## 2016-07-31 DIAGNOSIS — Z01812 Encounter for preprocedural laboratory examination: Secondary | ICD-10-CM | POA: Insufficient documentation

## 2016-07-31 HISTORY — DX: Unspecified glaucoma: H40.9

## 2016-07-31 LAB — URINALYSIS, ROUTINE W REFLEX MICROSCOPIC
Bilirubin Urine: NEGATIVE
Glucose, UA: NEGATIVE mg/dL
Hgb urine dipstick: NEGATIVE
Ketones, ur: NEGATIVE mg/dL
NITRITE: NEGATIVE
PROTEIN: NEGATIVE mg/dL
Specific Gravity, Urine: 1.016 (ref 1.005–1.030)
pH: 6 (ref 5.0–8.0)

## 2016-07-31 LAB — CBC WITH DIFFERENTIAL/PLATELET
BASO%: 1.3 % (ref 0.0–2.0)
BASOS ABS: 0.1 10*3/uL (ref 0.0–0.1)
EOS ABS: 0.2 10*3/uL (ref 0.0–0.5)
EOS%: 5.3 % (ref 0.0–7.0)
HEMATOCRIT: 35.4 % (ref 34.8–46.6)
HEMOGLOBIN: 11.5 g/dL — AB (ref 11.6–15.9)
LYMPH#: 1 10*3/uL (ref 0.9–3.3)
LYMPH%: 22.6 % (ref 14.0–49.7)
MCH: 28.6 pg (ref 25.1–34.0)
MCHC: 32.4 g/dL (ref 31.5–36.0)
MCV: 88.3 fL (ref 79.5–101.0)
MONO#: 0.7 10*3/uL (ref 0.1–0.9)
MONO%: 15 % — ABNORMAL HIGH (ref 0.0–14.0)
NEUT%: 55.8 % (ref 38.4–76.8)
NEUTROS ABS: 2.5 10*3/uL (ref 1.5–6.5)
PLATELETS: 419 10*3/uL — AB (ref 145–400)
RBC: 4.01 10*6/uL (ref 3.70–5.45)
RDW: 14 % (ref 11.2–14.5)
WBC: 4.5 10*3/uL (ref 3.9–10.3)

## 2016-07-31 LAB — PROTIME-INR
INR: 0.94
Prothrombin Time: 12.6 seconds (ref 11.4–15.2)

## 2016-07-31 LAB — COMPREHENSIVE METABOLIC PANEL
ALBUMIN: 3.2 g/dL — AB (ref 3.5–5.0)
ALK PHOS: 109 U/L (ref 40–150)
ALT: 13 U/L (ref 0–55)
ANION GAP: 7 meq/L (ref 3–11)
AST: 19 U/L (ref 5–34)
BILIRUBIN TOTAL: 0.39 mg/dL (ref 0.20–1.20)
BUN: 14.3 mg/dL (ref 7.0–26.0)
CALCIUM: 9.7 mg/dL (ref 8.4–10.4)
CO2: 26 mEq/L (ref 22–29)
Chloride: 107 mEq/L (ref 98–109)
Creatinine: 0.8 mg/dL (ref 0.6–1.1)
EGFR: 68 mL/min/{1.73_m2} — AB (ref >90)
GLUCOSE: 104 mg/dL (ref 70–140)
Potassium: 4.5 mEq/L (ref 3.5–5.1)
SODIUM: 139 meq/L (ref 136–145)
TOTAL PROTEIN: 6.4 g/dL (ref 6.4–8.3)

## 2016-07-31 LAB — URINE MICROSCOPIC-ADD ON: RBC / HPF: NONE SEEN RBC/hpf (ref 0–5)

## 2016-07-31 LAB — APTT: aPTT: 31 seconds (ref 24–36)

## 2016-07-31 LAB — ABO/RH: ABO/RH(D): B POS

## 2016-07-31 LAB — SURGICAL PCR SCREEN
MRSA, PCR: NEGATIVE
STAPHYLOCOCCUS AUREUS: POSITIVE — AB

## 2016-07-31 LAB — CEA (IN HOUSE-CHCC): CEA (CHCC-In House): 213.9 ng/mL — ABNORMAL HIGH (ref 0.00–5.00)

## 2016-07-31 NOTE — Progress Notes (Signed)
[place] Telephone:([phone]   [phone]  OFFICE PROGRESS NOTE  Eber Hong, MD [address] [place] [address]  Principle Diagnosis:  1) Metastatic non-small cell lung cancer adenocarcinoma with positive for EGFR mutation at exon 21 diagnosed in March of 2012.  2) history of breast cancer.  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   Current therapy:  1) Tarceva at 150 mg by mouth daily status post 66 months therapy.  2) Femara 2.5 mg by mouth daily.   CHEMOTHERAPY INTENT: palliative  CURRENT # OF CHEMOTHERAPY CYCLES: 67 CURRENT ANTIEMETICS: Compazine when necessary  CURRENT SMOKING STATUS: Never smoker  ORAL CHEMOTHERAPY AND CONSENT: Tarceva and Femara.  CURRENT BISPHOSPHONATES USE: None  PAIN MANAGEMENT: None  NARCOTICS INDUCED CONSTIPATION: None  LIVING WILL AND CODE STATUS: Full code initially but no prolonged resuscitation.   INTERVAL HISTORY: Kristin [name] 75 y.o. female returns to the clinic today for two-month follow up visit. The patient is much better today with no specific complaints. She is currently on treatment with Tarceva for metastatic lung cancer as well as for Femara for history of breast cancer. The patient also recently diagnosed with colon cancer and currently on observation. She is expected to have left hip replacement next week. The patient denied having any chest pain, shortness of breath, cough or hemoptysis. She denied having any rectal bleeding or black tarry stool. She denied having any significant nausea or vomiting, no fever or chills. She has no weight loss or night sweats. She is here today for evaluation with repeat blood work.  MEDICAL HISTORY: Past Medical History:  Diagnosis Date  . Anemia   . Anxiety   . Arthritis    arthritis- left hip  . Blood transfusion without reported diagnosis    transfusion- 3-4 yrs ago -found to be anemic on routine lab  check  . Breast cancer ([place]) 2002   bilateral- bilateral mastectomies done.  . 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 CVA (cerebral infarction) 04/30/2013  . Family history of brain cancer   . Family history of breast cancer   . Family history of colon cancer 10/06/2009  . Family history of colon cancer   . Family history of prostate cancer   . GERD (gastroesophageal reflux disease)   . History of bilateral mastectomy 05/20/2010  . Hypertension   . Lung cancer ([place]) dx'd 12/2010   last Ct scan "no lung cancer" showing 11'16 CT Chest Epic.  . Malignant neoplasm of ascending colon ([place]) 01/29/2016  . Stroke [facility])    2 yrs ago-no residual  . Tubular adenoma of colon 07/2011   colon polyps ans reoccurence with malignancy found    ALLERGIES:  is allergic to doxycycline and sulfa antibiotics.  MEDICATIONS:  Current Outpatient Prescriptions  Medication Sig Dispense Refill  . amLODipine (NORVASC) 5 MG tablet Take 5 mg by mouth daily.      . brimonidine-timolol (COMBIGAN) 0.2-0.5 % ophthalmic solution Place 1 drop into the right eye every 12 (twelve) hours.    . Calcium Carb-Cholecalciferol (RA CALCIUM 600/VITAMIN D-3) 600-400 MG-UNIT TABS Take 2 tablets by mouth daily.      . clopidogrel (PLAVIX) 75 MG tablet Take 1 tablet (75 mg total) by mouth daily with breakfast. 90 tablet 3  . cycloSPORINE (RESTASIS) 0.05 % ophthalmic emulsion Place 1 drop into both eyes 2 (two) times daily.    Marland Kitchen erlotinib (TARCEVA) 150 MG tablet Take 1 tablet (150 mg  total) by mouth daily. Take on an empty stomach 1 hour before meals or 2 hours after. 30 tablet 3  . escitalopram (LEXAPRO) 10 MG tablet Take 10 mg by mouth daily.     . famotidine (PEPCID) 40 MG tablet Take 40 mg by mouth daily.    Marland Kitchen FeFum-FePoly-FA-B Cmp-C-Biot (FOLIVANE-PLUS) CAPS TAKE 1 CAPSULE BY MOUTH DAILY 90 capsule 1  . letrozole (FEMARA) 2.5 MG tablet TAKE ONE TABLET EACH DAY 30 tablet 2  . lisinopril (PRINIVIL,ZESTRIL) 20 MG tablet Take 20 mg by mouth  daily.     Marland Kitchen zolpidem (AMBIEN) 5 MG tablet Take 5 mg by mouth at bedtime as needed for sleep. Reported on 04/25/2016     No current facility-administered medications for this visit.     SURGICAL HISTORY:  Past Surgical History:  Procedure Laterality Date  . APPENDECTOMY    . cataracts Bilateral   . COLONOSCOPY    . LUNG SURGERY     Resection -" not done. Pt . tx with Tarceva  . MASTECTOMY Bilateral   . RECONSTRUCTION BREAST W/ TRAM FLAP Bilateral    bilaterally  . TEE WITHOUT CARDIOVERSION N/A 06/04/2013   Procedure: TRANSESOPHAGEAL ECHOCARDIOGRAM (TEE);  Surgeon: Jolaine Artist, MD;  Location: [facility] ENDOSCOPY;  Service: Cardiovascular;  Laterality: N/A;  . TONSILLECTOMY      REVIEW OF SYSTEMS:  A comprehensive review of systems was negative except for: Constitutional: positive for fatigue Musculoskeletal: positive for arthralgias   PHYSICAL EXAMINATION: General appearance: alert, cooperative and no distress Head: Normocephalic, without obvious abnormality, atraumatic Neck: no adenopathy, no JVD and thyroid not enlarged, symmetric, no tenderness/mass/nodules Lymph nodes: Cervical, supraclavicular, and axillary nodes normal. Resp: clear to auscultation bilaterally and normal percussion bilaterally Back: symmetric, no curvature. ROM normal. No CVA tenderness. Cardio: regular rate and rhythm, S1, S2 normal, no murmur, click, rub or gallop GI: soft, non-tender; bowel sounds normal; no masses,  no organomegaly Extremities: extremities normal, atraumatic, no cyanosis or edema and Grade 2 skin rash on the lower extremities Neurologic: Alert and oriented X 3, normal strength and tone. Normal symmetric reflexes. Normal coordination and gait  ECOG PERFORMANCE STATUS: 1 - Symptomatic but completely ambulatory  Blood pressure (!) 137/46, pulse 63, temperature 97.8 F (36.6 C), temperature source Oral, resp. rate 18, height _0  (1.702 m), weight 132 lb 14.4 oz (60.3 kg), SpO2 99  %.  LABORATORY DATA: Lab Results  Component Value Date   WBC 4.5 07/31/2016   HGB 11.5 (L) 07/31/2016   HCT 35.4 07/31/2016   MCV 88.3 07/31/2016   PLT 419 (H) 07/31/2016      Chemistry      Component Value Date/Time   NA 139 07/31/2016 1021   K 4.5 07/31/2016 1021   CL 108 03/07/2016 0434   CL 107 04/05/2013 0754   CO2 26 07/31/2016 1021   BUN 14.3 07/31/2016 1021   CREATININE 0.8 07/31/2016 1021      Component Value Date/Time   CALCIUM 9.7 07/31/2016 1021   ALKPHOS 109 07/31/2016 1021   AST 19 07/31/2016 1021   ALT 13 07/31/2016 1021   BILITOT 0.39 07/31/2016 1021        RADIOGRAPHIC STUDIES: No results found.  ASSESSMENT AND PLAN: this is a very pleasant 75 years old white female with:  1)  Metastatic non-small cell lung cancer, adenocarcinoma with positive EGFR mutation: Currently on treatment with Tarceva status post 63 months of treatment. She has been off treatment during her surgery but the  patient resumed treatment with Tarceva the last few weeks and treating it well The patient is feeling fine with no specific complaints and tolerating her treatment well. I recommended for the patient to continue her current treatment with Tarceva with the same dose.   2) Stage II colon adenocarcinoma status post proximal colectomy with lymph node dissection. I recommended for the patient to continue on observation for now. Unfortunately his CEA is doubling and very short period. I will order CT scan of the chest, abdomen and pelvis for restaging of her disease and to rule out any metastasis.  3) For the history of the breast cancer, I recommended for the patient to resume her treatment with Femara 2.5 mg by mouth daily.  4) For the osteoporosis, the patient will continue on Prolia every 6 months.  5) iron deficiency anemia: I recommended for the patient to continue her treatment with Integra +1 capsule by mouth daily.  She will come back for follow-up visit in one month  for reevaluation with repeat blood work and CT scan. The patient voices understanding of current disease status and treatment options and is in agreement with the current care plan.  All questions were answered. The patient knows to call the clinic with any problems, questions or concerns. We can certainly see the patient much sooner if necessary.  Disclaimer: This note was dictated with voice recognition software. Similar sounding words can inadvertently be transcribed and may not be corrected upon review.

## 2016-07-31 NOTE — Telephone Encounter (Signed)
2 bottles of contrast given per CT orders and 07/31/16 los.

## 2016-07-31 NOTE — Telephone Encounter (Signed)
Avs report and appointment schedule given to patient per 07/31/16 los.

## 2016-08-01 NOTE — Pre-Procedure Instructions (Signed)
08-01-16 0805 Pt. Made aware of Positive Staph aureus PCR, will use Mupirocin as directed, med to be called to [facility] [place], [place]. Note sent per Epic to Dr. Wynelle Link office.

## 2016-08-02 ENCOUNTER — Telehealth: Payer: Self-pay | Admitting: Medical Oncology

## 2016-08-02 NOTE — Telephone Encounter (Signed)
Asking if okay to have CT abd after hip surgery.note to [place].

## 2016-08-05 ENCOUNTER — Ambulatory Visit: Payer: Self-pay | Admitting: Orthopedic Surgery

## 2016-08-05 NOTE — H&P (Signed)
Kristin [name] DOB: Apr 08, 1941 Married / Language: English / Race: White Female Date of Admission:  08/07/2016 CC:  Left Hip Pain History of Present Illness  The patient is a 75 year old female who comes in for a preoperative History and Physical. The patient is scheduled for a left total hip arthroplasty (anterior) to be performed by Dr. Dione Plover. Aluisio, MD at [facility] on 08-07-2016. The patient is a 75 year old female who presented with a hip problem. The patient was seen in referral from Dr. Eber Hong at [facility].The patient reports left hip and left lateral hip problems including pain, weakness, giving way and tenderness. The patient reports symptoms radiating to the: left thigh.The symptoms are described as moderate in severity.The patient feels as if their symptoms are does feel they are worsening. Symptoms are relieved by nonsteroidal anti-inflammatory drugs (advil). Current treatment includes use of a walker (cane(because the left hip giveaway)) and nonsteroidal anti-inflammatory drugs. This problem has not been previously evaluated. This problem has not been previously treated. Kristin [name] presents today with a chief complaint of left hip pain. She reports that she has had this for quite some time. No injury that she can recall. She has not had any falls. It has progressed to the point where she is having to walk with a cane because she feels like her hip is ""getting away."" She has tried some anti-inflammatories in the form of Advil, with minimal improvement. She is having a lot of pain along the lateral aspect of her left hip, minimal groin pain. It does not typically radiate down her leg. No low back pain. No numbness or tingling. It has been progressive over the past year. She has had two I-A hip injections, the first which helped and the second did not help much at all. She is now ready to proceed with surgery at this time. They have been treated conservatively in the  past for the above stated problem and despite conservative measures, they continue to have progressive pain and severe functional limitations and dysfunction. They have failed non-operative management including home exercise, medications, and injections. It is felt that they would benefit from undergoing total joint replacement. Risks and benefits of the procedure have been discussed with the patient and they elect to proceed with surgery. There are no active contraindications to surgery such as ongoing infection or rapidly progressive neurological disease.  Problem List/Past Medical Primary osteoarthritis of left hip (M16.12)  Breast Cancer  Lung Cancer  Colon Cancer  Cerebrovascular Accident  2014 Hiatal Hernia  Gastroesophageal Reflux Disease  High blood pressure  Glaucoma  Cataract  Measles  Mumps  Allergies SulfADIAZINE *Sulfonamides**  Rash.  Family History Cancer  father, sister and grandmother fathers side Hypertension  sister  Social History  Illicit drug use  no Living situation  live with spouse Marital status  married Drug/Alcohol Rehab (Currently)  no Drug/Alcohol Rehab (Previously)  no Exercise  Exercises never Number of flights of stairs before winded  1 Pain Contract  no Tobacco use  never smoker Current work status  retired Alcohol use  current drinker; only occasionally per week Children  2 Advance Directives  Living Will, Healthcare POA  Medication History Lisinopril ('20MG'$  Tablet, Oral) Active. Zolpidem Tartrate ('5MG'$  Tablet, Oral) Active. AmLODIPine Besylate ('5MG'$  Tablet, Oral) Active. Tarceva ('150MG'$  Tablet, Oral) Active. Folivane-F (125-'1MG'$  Capsule, Oral) Active. Famotidine ('40MG'$  Tablet, Oral) Active. Escitalopram Oxalate ('10MG'$  Tablet, Oral) Active. Clopidogrel Bisulfate ('75MG'$  Tablet, Oral) Active. Combigan (0.2-0.5% Solution, Ophthalmic)  Active. Letrozole (2.'5MG'$  Tablet, Oral) Active. Calcium-Vitamin D (Oral)  Specific strength unknown - Active. Loratadine ('10MG'$  Tablet, Oral) Active.   Past Surgical History Tubal Ligation  Appendectomy  Breast Reconstruction  bilateral Mastectomy - Bilateral  Date: 2002. bilateral Lung Cancer Surgery  Date: 2012. Exploratory Surgery Partial Colectomy  Date: 2017.   Review of Systems  General Not Present- Chills, Fatigue, Fever, Memory Loss, Night Sweats, Weight Gain and Weight Loss. Skin Not Present- Eczema, Hives, Itching, Lesions and Rash. HEENT Not Present- Dentures, Double Vision, Headache, Hearing Loss, Tinnitus and Visual Loss. Respiratory Not Present- Allergies, Chronic Cough, Coughing up blood, Shortness of breath at rest and Shortness of breath with exertion. Cardiovascular Not Present- Chest Pain, Difficulty Breathing Lying Down, Murmur, Palpitations, Racing/skipping heartbeats and Swelling. Gastrointestinal Not Present- Abdominal Pain, Bloody Stool, Constipation, Diarrhea, Difficulty Swallowing, Heartburn, Jaundice, Loss of appetitie, Nausea and Vomiting. Female Genitourinary Not Present- Blood in Urine, Discharge, Flank Pain, Incontinence, Painful Urination, Urgency, Urinary frequency, Urinary Retention, Urinating at Night and Weak urinary stream. Musculoskeletal Present- Joint Pain. Not Present- Back Pain, Joint Swelling, Morning Stiffness, Muscle Pain, Muscle Weakness and Spasms. Neurological Not Present- Blackout spells, Difficulty with balance, Dizziness, Paralysis, Tremor and Weakness. Psychiatric Not Present- Insomnia.  Vitals Weight: 133 lb Height: 68in Weight was reported by patient. Height was reported by patient. Body Surface Area: 1.72 m Body Mass Index: 20.22 kg/m  Pulse: 56 (Regular)  BP: 118/60 (Sitting, Left Arm, Standard) Patient tends to avoid the right arm for BP's  Physical Exam General Mental Status -Alert, cooperative and good historian. General Appearance-pleasant, Not in acute  distress. Orientation-Oriented X3. Build & Nutrition-Well nourished and Well developed.  Head and Neck Head-normocephalic, atraumatic . Neck Global Assessment - supple, no bruit auscultated on the right, no bruit auscultated on the left.  Eye Vision-Wears corrective lenses(readers only). Pupil - Bilateral-Regular and Round. Motion - Bilateral-EOMI.  Chest and Lung Exam Auscultation Breath sounds - clear at anterior chest wall and clear at posterior chest wall. Adventitious sounds - No Adventitious sounds.  Cardiovascular Auscultation Rhythm - Regular rate and rhythm. Heart Sounds - S1 WNL and S2 WNL. Murmurs & Other Heart Sounds - Auscultation of the heart reveals - No Murmurs.  Abdomen Palpation/Percussion Tenderness - Abdomen is non-tender to palpation. Rigidity (guarding) - Abdomen is soft. Auscultation Auscultation of the abdomen reveals - Bowel sounds normal.  Female Genitourinary Note: Not done, not pertinent to present illness   Musculoskeletal Note: On exam, she is alert and oriented, in no apparent distress. Left hip can be flexed to 100. No internal rotation, about 10 degrees of external rotation and 10 to 20 degrees of abduction. Right hip range of motion is normal.  RADIOGRAPHS We did review her x-rays. She does have bone on bone arthritis of the left hip with subchondral cystic formation. There is also osteophyte formation present. Her radiographs have shown worsening arthritis compared to a year ago.  Assessment & Plan  Primary osteoarthritis of left hip (M16.12)  Note:Surgical Plans: Left Total Hip Replacement - Anterior Approach  Disposition: Home  PCP: Dr. Brynda Greathouse - [place]  Topical TXA - Cancer  Anesthesia Issues: None with medications DIFFICULT AIRWAY PATIENT  Bundle Progam Patient Plan for Home with HHPT  Signed electronically by Ok Edwards, III PA-C

## 2016-08-07 ENCOUNTER — Inpatient Hospital Stay (HOSPITAL_COMMUNITY): Payer: Medicare Other

## 2016-08-07 ENCOUNTER — Inpatient Hospital Stay (HOSPITAL_COMMUNITY)
Admission: RE | Admit: 2016-08-07 | Discharge: 2016-08-08 | DRG: 470 | Disposition: A | Discharge status: Home Health | Payer: Medicare Other | Source: Ambulatory Visit | Attending: Orthopedic Surgery | Admitting: Orthopedic Surgery

## 2016-08-07 ENCOUNTER — Inpatient Hospital Stay (HOSPITAL_COMMUNITY): Payer: Medicare Other | Admitting: Certified Registered"

## 2016-08-07 ENCOUNTER — Encounter (HOSPITAL_COMMUNITY)
Admission: RE | Disposition: A | Discharge status: Home Health | Payer: Self-pay | Source: Ambulatory Visit | Attending: Orthopedic Surgery

## 2016-08-07 ENCOUNTER — Encounter (HOSPITAL_COMMUNITY): Payer: Self-pay | Admitting: Certified Registered"

## 2016-08-07 DIAGNOSIS — Z85038 Personal history of other malignant neoplasm of large intestine: Secondary | ICD-10-CM

## 2016-08-07 DIAGNOSIS — Z96649 Presence of unspecified artificial hip joint: Secondary | ICD-10-CM

## 2016-08-07 DIAGNOSIS — K449 Diaphragmatic hernia without obstruction or gangrene: Secondary | ICD-10-CM | POA: Diagnosis present

## 2016-08-07 DIAGNOSIS — Z803 Family history of malignant neoplasm of breast: Secondary | ICD-10-CM

## 2016-08-07 DIAGNOSIS — Z79899 Other long term (current) drug therapy: Secondary | ICD-10-CM

## 2016-08-07 DIAGNOSIS — M25552 Pain in left hip: Secondary | ICD-10-CM | POA: Diagnosis present

## 2016-08-07 DIAGNOSIS — Z9013 Acquired absence of bilateral breasts and nipples: Secondary | ICD-10-CM

## 2016-08-07 DIAGNOSIS — M1612 Unilateral primary osteoarthritis, left hip: Secondary | ICD-10-CM | POA: Diagnosis not present

## 2016-08-07 DIAGNOSIS — K219 Gastro-esophageal reflux disease without esophagitis: Secondary | ICD-10-CM | POA: Diagnosis present

## 2016-08-07 DIAGNOSIS — Z8673 Personal history of transient ischemic attack (TIA), and cerebral infarction without residual deficits: Secondary | ICD-10-CM

## 2016-08-07 DIAGNOSIS — Z85118 Personal history of other malignant neoplasm of bronchus and lung: Secondary | ICD-10-CM | POA: Diagnosis not present

## 2016-08-07 DIAGNOSIS — Z7902 Long term (current) use of antithrombotics/antiplatelets: Secondary | ICD-10-CM | POA: Diagnosis not present

## 2016-08-07 DIAGNOSIS — Z800 Family history of malignant neoplasm of digestive organs: Secondary | ICD-10-CM | POA: Diagnosis not present

## 2016-08-07 DIAGNOSIS — H409 Unspecified glaucoma: Secondary | ICD-10-CM | POA: Diagnosis not present

## 2016-08-07 DIAGNOSIS — M169 Osteoarthritis of hip, unspecified: Secondary | ICD-10-CM

## 2016-08-07 DIAGNOSIS — Z8042 Family history of malignant neoplasm of prostate: Secondary | ICD-10-CM

## 2016-08-07 DIAGNOSIS — Z853 Personal history of malignant neoplasm of breast: Secondary | ICD-10-CM

## 2016-08-07 DIAGNOSIS — I10 Essential (primary) hypertension: Secondary | ICD-10-CM | POA: Diagnosis present

## 2016-08-07 DIAGNOSIS — Z96642 Presence of left artificial hip joint: Secondary | ICD-10-CM | POA: Insufficient documentation

## 2016-08-07 HISTORY — PX: TOTAL HIP ARTHROPLASTY: SHX124

## 2016-08-07 LAB — TYPE AND SCREEN
ABO/RH(D): B POS
Antibody Screen: NEGATIVE

## 2016-08-07 SURGERY — ARTHROPLASTY, HIP, TOTAL, ANTERIOR APPROACH
Anesthesia: Spinal | Site: Hip | Laterality: Left

## 2016-08-07 MED ORDER — MORPHINE SULFATE (PF) 2 MG/ML IV SOLN: 1.0000 mg | INTRAVENOUS | Status: DC | PRN

## 2016-08-07 MED ORDER — ACETAMINOPHEN 500 MG PO TABS
1000.0000 mg | ORAL_TABLET | Freq: Four times a day (QID) | ORAL | Status: DC
  Administered 2016-08-07 – 2016-08-08 (×3): 1000 mg via ORAL
  Filled 2016-08-07 (×3): qty 2

## 2016-08-07 MED ORDER — TRANEXAMIC ACID 1000 MG/10ML IV SOLN
2000.0000 mg | Freq: Once | INTRAVENOUS | Status: DC
  Filled 2016-08-07: qty 20

## 2016-08-07 MED ORDER — DOCUSATE SODIUM 100 MG PO CAPS
100.0000 mg | ORAL_CAPSULE | Freq: Two times a day (BID) | ORAL | Status: DC
  Administered 2016-08-07 – 2016-08-08 (×2): 100 mg via ORAL
  Filled 2016-08-07 (×2): qty 1

## 2016-08-07 MED ORDER — METOCLOPRAMIDE HCL 5 MG/ML IJ SOLN: 5.0000 mg | Freq: Three times a day (TID) | INTRAMUSCULAR | Status: DC | PRN

## 2016-08-07 MED ORDER — BUPIVACAINE IN DEXTROSE 0.75-8.25 % IT SOLN
INTRATHECAL | Status: DC | PRN
  Administered 2016-08-07: 2 mL via INTRATHECAL

## 2016-08-07 MED ORDER — ACETAMINOPHEN 650 MG RE SUPP: 650.0000 mg | Freq: Four times a day (QID) | RECTAL | Status: DC | PRN

## 2016-08-07 MED ORDER — OXYCODONE HCL 5 MG PO TABS: 5.0000 mg | ORAL_TABLET | Freq: Once | ORAL | Status: DC | PRN

## 2016-08-07 MED ORDER — LACTATED RINGERS IV SOLN
INTRAVENOUS | Status: DC
  Administered 2016-08-07: 1000 mL via INTRAVENOUS
  Administered 2016-08-07: 11:00:00 via INTRAVENOUS

## 2016-08-07 MED ORDER — BUPIVACAINE HCL (PF) 0.25 % IJ SOLN
INTRAMUSCULAR | Status: AC
  Filled 2016-08-07: qty 30

## 2016-08-07 MED ORDER — PHENOL 1.4 % MT LIQD: 1.0000 | OROMUCOSAL | Status: DC | PRN

## 2016-08-07 MED ORDER — CYCLOSPORINE 0.05 % OP EMUL
1.0000 [drp] | Freq: Two times a day (BID) | OPHTHALMIC | Status: DC
  Administered 2016-08-07 – 2016-08-08 (×2): 1 [drp] via OPHTHALMIC
  Filled 2016-08-07 (×2): qty 1

## 2016-08-07 MED ORDER — PROPOFOL 500 MG/50ML IV EMUL
INTRAVENOUS | Status: DC | PRN
  Administered 2016-08-07: 50 ug/kg/min via INTRAVENOUS

## 2016-08-07 MED ORDER — FENTANYL CITRATE (PF) 100 MCG/2ML IJ SOLN
INTRAMUSCULAR | Status: DC | PRN
  Administered 2016-08-07 (×2): 50 ug via INTRAVENOUS

## 2016-08-07 MED ORDER — CEFAZOLIN SODIUM-DEXTROSE 2-4 GM/100ML-% IV SOLN
2.0000 g | Freq: Four times a day (QID) | INTRAVENOUS | Status: AC
  Administered 2016-08-07 (×2): 2 g via INTRAVENOUS
  Filled 2016-08-07 (×2): qty 100

## 2016-08-07 MED ORDER — RIVAROXABAN 10 MG PO TABS
10.0000 mg | ORAL_TABLET | Freq: Every day | ORAL | Status: DC
  Administered 2016-08-08: 10 mg via ORAL
  Filled 2016-08-07: qty 1

## 2016-08-07 MED ORDER — LACTATED RINGERS IV SOLN: INTRAVENOUS | Status: DC

## 2016-08-07 MED ORDER — ACETAMINOPHEN 325 MG PO TABS: 650.0000 mg | ORAL_TABLET | Freq: Four times a day (QID) | ORAL | Status: DC | PRN

## 2016-08-07 MED ORDER — DEXAMETHASONE SODIUM PHOSPHATE 10 MG/ML IJ SOLN
10.0000 mg | Freq: Once | INTRAMUSCULAR | Status: AC
  Administered 2016-08-07: 10 mg via INTRAVENOUS

## 2016-08-07 MED ORDER — BRIMONIDINE TARTRATE-TIMOLOL 0.2-0.5 % OP SOLN: 1.0000 [drp] | Freq: Two times a day (BID) | OPHTHALMIC | Status: DC

## 2016-08-07 MED ORDER — OXYCODONE HCL 5 MG/5ML PO SOLN
5.0000 mg | Freq: Once | ORAL | Status: DC | PRN
  Filled 2016-08-07: qty 5

## 2016-08-07 MED ORDER — BRIMONIDINE TARTRATE 0.2 % OP SOLN
1.0000 [drp] | Freq: Two times a day (BID) | OPHTHALMIC | Status: DC
  Administered 2016-08-07 – 2016-08-08 (×2): 1 [drp] via OPHTHALMIC
  Filled 2016-08-07: qty 5

## 2016-08-07 MED ORDER — BUPIVACAINE HCL (PF) 0.25 % IJ SOLN
INTRAMUSCULAR | Status: DC | PRN
  Administered 2016-08-07: 30 mL

## 2016-08-07 MED ORDER — ONDANSETRON HCL 4 MG PO TABS: 4.0000 mg | ORAL_TABLET | Freq: Four times a day (QID) | ORAL | Status: DC | PRN

## 2016-08-07 MED ORDER — STERILE WATER FOR IRRIGATION IR SOLN
Status: DC | PRN
  Administered 2016-08-07: 3000 mL

## 2016-08-07 MED ORDER — ONDANSETRON HCL 4 MG/2ML IJ SOLN: 4.0000 mg | Freq: Once | INTRAMUSCULAR | Status: DC | PRN

## 2016-08-07 MED ORDER — MEPERIDINE HCL 50 MG/ML IJ SOLN: 6.2500 mg | INTRAMUSCULAR | Status: DC | PRN

## 2016-08-07 MED ORDER — ONDANSETRON HCL 4 MG/2ML IJ SOLN
INTRAMUSCULAR | Status: DC | PRN
  Administered 2016-08-07: 4 mg via INTRAVENOUS

## 2016-08-07 MED ORDER — ONDANSETRON HCL 4 MG/2ML IJ SOLN: 4.0000 mg | Freq: Four times a day (QID) | INTRAMUSCULAR | Status: DC | PRN

## 2016-08-07 MED ORDER — PROPOFOL 10 MG/ML IV BOLUS
INTRAVENOUS | Status: AC
  Filled 2016-08-07: qty 20

## 2016-08-07 MED ORDER — ERLOTINIB HCL 150 MG PO TABS
150.0000 mg | ORAL_TABLET | Freq: Every day | ORAL | Status: DC
  Filled 2016-08-07 (×2): qty 1

## 2016-08-07 MED ORDER — LETROZOLE 2.5 MG PO TABS
2.5000 mg | ORAL_TABLET | Freq: Every day | ORAL | Status: DC
  Filled 2016-08-07 (×2): qty 1

## 2016-08-07 MED ORDER — METOCLOPRAMIDE HCL 5 MG PO TABS: 5.0000 mg | ORAL_TABLET | Freq: Three times a day (TID) | ORAL | Status: DC | PRN

## 2016-08-07 MED ORDER — METHOCARBAMOL 1000 MG/10ML IJ SOLN
500.0000 mg | Freq: Four times a day (QID) | INTRAVENOUS | Status: DC | PRN
  Administered 2016-08-07: 500 mg via INTRAVENOUS
  Filled 2016-08-07: qty 5
  Filled 2016-08-07: qty 550

## 2016-08-07 MED ORDER — ACETAMINOPHEN 10 MG/ML IV SOLN
INTRAVENOUS | Status: AC
  Filled 2016-08-07: qty 100

## 2016-08-07 MED ORDER — LIDOCAINE 2% (20 MG/ML) 5 ML SYRINGE
INTRAMUSCULAR | Status: AC
  Filled 2016-08-07: qty 5

## 2016-08-07 MED ORDER — ESCITALOPRAM OXALATE 10 MG PO TABS
10.0000 mg | ORAL_TABLET | Freq: Every day | ORAL | Status: DC
  Administered 2016-08-08: 10 mg via ORAL
  Filled 2016-08-07: qty 1

## 2016-08-07 MED ORDER — MENTHOL 3 MG MT LOZG: 1.0000 | LOZENGE | OROMUCOSAL | Status: DC | PRN

## 2016-08-07 MED ORDER — ZOLPIDEM TARTRATE 5 MG PO TABS: 5.0000 mg | ORAL_TABLET | Freq: Every evening | ORAL | Status: DC | PRN

## 2016-08-07 MED ORDER — SODIUM CHLORIDE 0.9 % IV SOLN
INTRAVENOUS | Status: DC
  Administered 2016-08-07: 75 mL/h via INTRAVENOUS

## 2016-08-07 MED ORDER — FLEET ENEMA 7-19 GM/118ML RE ENEM: 1.0000 | ENEMA | Freq: Once | RECTAL | Status: DC | PRN

## 2016-08-07 MED ORDER — ACETAMINOPHEN 10 MG/ML IV SOLN
1000.0000 mg | Freq: Once | INTRAVENOUS | Status: AC
  Administered 2016-08-07: 1000 mg via INTRAVENOUS

## 2016-08-07 MED ORDER — TIMOLOL MALEATE 0.5 % OP SOLN
1.0000 [drp] | Freq: Two times a day (BID) | OPHTHALMIC | Status: DC
  Administered 2016-08-07 – 2016-08-08 (×2): 1 [drp] via OPHTHALMIC
  Filled 2016-08-07: qty 5

## 2016-08-07 MED ORDER — OXYCODONE HCL 5 MG PO TABS
5.0000 mg | ORAL_TABLET | ORAL | Status: DC | PRN
  Administered 2016-08-07 – 2016-08-08 (×2): 5 mg via ORAL
  Filled 2016-08-07 (×2): qty 1

## 2016-08-07 MED ORDER — TRANEXAMIC ACID 1000 MG/10ML IV SOLN
INTRAVENOUS | Status: DC | PRN
  Administered 2016-08-07: 2000 mg via TOPICAL

## 2016-08-07 MED ORDER — HYDROMORPHONE HCL 1 MG/ML IJ SOLN: 0.2500 mg | INTRAMUSCULAR | Status: DC | PRN

## 2016-08-07 MED ORDER — BISACODYL 10 MG RE SUPP: 10.0000 mg | Freq: Every day | RECTAL | Status: DC | PRN

## 2016-08-07 MED ORDER — CHLORHEXIDINE GLUCONATE 4 % EX LIQD: 60.0000 mL | Freq: Once | CUTANEOUS | Status: DC

## 2016-08-07 MED ORDER — FAMOTIDINE 20 MG PO TABS
40.0000 mg | ORAL_TABLET | Freq: Every day | ORAL | Status: DC
  Administered 2016-08-08: 40 mg via ORAL
  Filled 2016-08-07: qty 2

## 2016-08-07 MED ORDER — DIPHENHYDRAMINE HCL 12.5 MG/5ML PO ELIX: 12.5000 mg | ORAL_SOLUTION | ORAL | Status: DC | PRN

## 2016-08-07 MED ORDER — AMLODIPINE BESYLATE 5 MG PO TABS: 5.0000 mg | ORAL_TABLET | Freq: Every day | ORAL | Status: DC

## 2016-08-07 MED ORDER — FENTANYL CITRATE (PF) 100 MCG/2ML IJ SOLN
INTRAMUSCULAR | Status: AC
  Filled 2016-08-07: qty 2

## 2016-08-07 MED ORDER — CEFAZOLIN SODIUM-DEXTROSE 2-4 GM/100ML-% IV SOLN
2.0000 g | INTRAVENOUS | Status: AC
  Administered 2016-08-07: 2 g via INTRAVENOUS

## 2016-08-07 MED ORDER — CEFAZOLIN SODIUM-DEXTROSE 2-4 GM/100ML-% IV SOLN
INTRAVENOUS | Status: AC
  Filled 2016-08-07: qty 100

## 2016-08-07 MED ORDER — METHOCARBAMOL 500 MG PO TABS
500.0000 mg | ORAL_TABLET | Freq: Four times a day (QID) | ORAL | Status: DC | PRN
  Administered 2016-08-08: 500 mg via ORAL
  Filled 2016-08-07: qty 1

## 2016-08-07 MED ORDER — POLYETHYLENE GLYCOL 3350 17 G PO PACK: 17.0000 g | PACK | Freq: Every day | ORAL | Status: DC | PRN

## 2016-08-07 MED ORDER — 0.9 % SODIUM CHLORIDE (POUR BTL) OPTIME
TOPICAL | Status: DC | PRN
  Administered 2016-08-07: 1000 mL

## 2016-08-07 MED ORDER — DEXAMETHASONE SODIUM PHOSPHATE 10 MG/ML IJ SOLN
10.0000 mg | Freq: Once | INTRAMUSCULAR | Status: AC
  Administered 2016-08-08: 10 mg via INTRAVENOUS
  Filled 2016-08-07: qty 1

## 2016-08-07 MED ORDER — HYDROMORPHONE HCL 1 MG/ML IJ SOLN
INTRAMUSCULAR | Status: AC
  Filled 2016-08-07: qty 1

## 2016-08-07 SURGICAL SUPPLY — 33 items
BAG DECANTER FOR FLEXI CONT (MISCELLANEOUS) ×3 IMPLANT
BAG ZIPLOCK 12X15 (MISCELLANEOUS) IMPLANT
BLADE SAG 18X100X1.27 (BLADE) ×3 IMPLANT
CAPT HIP TOTAL 2 ×3 IMPLANT
CLOSURE WOUND 1/2 X4 (GAUZE/BANDAGES/DRESSINGS) ×1
CLOTH BEACON ORANGE TIMEOUT ST (SAFETY) ×3 IMPLANT
COVER PERINEAL POST (MISCELLANEOUS) ×3 IMPLANT
DECANTER SPIKE VIAL GLASS SM (MISCELLANEOUS) ×3 IMPLANT
DRAPE STERI IOBAN 125X83 (DRAPES) ×3 IMPLANT
DRAPE U-SHAPE 47X51 STRL (DRAPES) ×6 IMPLANT
DRSG ADAPTIC 3X8 NADH LF (GAUZE/BANDAGES/DRESSINGS) ×3 IMPLANT
DRSG MEPILEX BORDER 4X4 (GAUZE/BANDAGES/DRESSINGS) ×3 IMPLANT
DRSG MEPILEX BORDER 4X8 (GAUZE/BANDAGES/DRESSINGS) ×3 IMPLANT
DURAPREP 26ML APPLICATOR (WOUND CARE) ×3 IMPLANT
ELECT REM PT RETURN 9FT ADLT (ELECTROSURGICAL) ×3
ELECTRODE REM PT RTRN 9FT ADLT (ELECTROSURGICAL) ×1 IMPLANT
EVACUATOR 1/8 PVC DRAIN (DRAIN) ×3 IMPLANT
GLOVE BIO SURGEON STRL SZ7.5 (GLOVE) ×3 IMPLANT
GLOVE BIO SURGEON STRL SZ8 (GLOVE) ×6 IMPLANT
GLOVE BIOGEL PI IND STRL 8 (GLOVE) ×2 IMPLANT
GLOVE BIOGEL PI INDICATOR 8 (GLOVE) ×4
GOWN STRL REUS W/TWL LRG LVL3 (GOWN DISPOSABLE) ×3 IMPLANT
GOWN STRL REUS W/TWL XL LVL3 (GOWN DISPOSABLE) ×3 IMPLANT
PACK ANTERIOR HIP CUSTOM (KITS) ×3 IMPLANT
STRIP CLOSURE SKIN 1/2X4 (GAUZE/BANDAGES/DRESSINGS) ×2 IMPLANT
SUT ETHIBOND NAB CT1 #1 30IN (SUTURE) ×3 IMPLANT
SUT MNCRL AB 4-0 PS2 18 (SUTURE) ×3 IMPLANT
SUT VIC AB 2-0 CT1 27 (SUTURE) ×4
SUT VIC AB 2-0 CT1 TAPERPNT 27 (SUTURE) ×2 IMPLANT
SUT VLOC 180 0 24IN GS25 (SUTURE) ×3 IMPLANT
SYR 50ML LL SCALE MARK (SYRINGE) IMPLANT
TRAY FOLEY W/METER SILVER 16FR (SET/KITS/TRAYS/PACK) ×3 IMPLANT
YANKAUER SUCT BULB TIP 10FT TU (MISCELLANEOUS) ×3 IMPLANT

## 2016-08-07 NOTE — Op Note (Signed)
OPERATIVE REPORT- TOTAL HIP ARTHROPLASTY   PREOPERATIVE DIAGNOSIS: Osteoarthritis of the Left hip.   POSTOPERATIVE DIAGNOSIS: Osteoarthritis of the Left  hip.   PROCEDURE: Left total hip arthroplasty, anterior approach.   SURGEON: Gaynelle Arabian, MD   ASSISTANT: Arlee Muslim, PA-C  ANESTHESIA:  Spinal  ESTIMATED BLOOD LOSS:-250 ml    DRAINS: Hemovac x1.   COMPLICATIONS: None   CONDITION: PACU - hemodynamically stable.   BRIEF CLINICAL NOTE: Kristin [name] is a 75 y.o. female who has advanced end-  stage arthritis of their Left  hip with progressively worsening pain and  dysfunction.The patient has failed nonoperative management and presents for  total hip arthroplasty.   PROCEDURE IN DETAIL: After successful administration of spinal  anesthetic, the traction boots for the [facility] bed were placed on both  feet and the patient was placed onto the [facility] bed, boots placed into the leg  holders. The Left hip was then isolated from the perineum with plastic  drapes and prepped and draped in the usual sterile fashion. ASIS and  greater trochanter were marked and a oblique incision was made, starting  at about 1 cm lateral and 2 cm distal to the ASIS and coursing towards  the anterior cortex of the femur. The skin was cut with a 10 blade  through subcutaneous tissue to the level of the fascia overlying the  tensor fascia lata muscle. The fascia was then incised in line with the  incision at the junction of the anterior third and posterior 2/3rd. The  muscle was teased off the fascia and then the interval between the TFL  and the rectus was developed. The Hohmann retractor was then placed at  the top of the femoral neck over the capsule. The vessels overlying the  capsule were cauterized and the fat on top of the capsule was removed.  A Hohmann retractor was then placed anterior underneath the rectus  femoris to give exposure to the entire anterior capsule. A T-shaped   capsulotomy was performed. The edges were tagged and the femoral head  was identified.       Osteophytes are removed off the superior acetabulum.  The femoral neck was then cut in situ with an oscillating saw. Traction  was then applied to the left lower extremity utilizing the [facility]  traction. The femoral head was then removed. Retractors were placed  around the acetabulum and then circumferential removal of the labrum was  performed. Osteophytes were also removed. Reaming starts at 45 mm to  medialize and  Increased in 2 mm increments to 49 mm. We reamed in  approximately 40 degrees of abduction, 20 degrees anteversion. A 50 mm  pinnacle acetabular shell was then impacted in anatomic position under  fluoroscopic guidance with excellent purchase. We did not need to place  any additional dome screws. A 32 mm neutral + 4 marathon liner was then  placed into the acetabular shell.       The femoral lift was then placed along the lateral aspect of the femur  just distal to the vastus ridge. The leg was  externally rotated and capsule  was stripped off the inferior aspect of the femoral neck down to the  level of the lesser trochanter, this was done with electrocautery. The femur was lifted after this was performed. The  leg was then placed in an extended and adducted position essentially delivering the femur. We also removed the capsule superiorly and the piriformis from the piriformis fossa  to gain excellent exposure of the  proximal femur. Rongeur was used to remove some cancellous bone to get  into the lateral portion of the proximal femur for placement of the  initial starter reamer. The starter broaches was placed  the starter broach  and was shown to go down the center of the canal. Broaching  with the  Corail system was then performed starting at size 8, coursing  Up to size 13. A size 13 had excellent torsional and rotational  and axial stability. The trial standard offset neck was then  placed  with a 32 + 1 trial head. The hip was then reduced. We confirmed that  the stem was in the canal both on AP and lateral x-rays. It also has excellent sizing. The hip was reduced with outstanding stability through full extension and full external rotation.. AP pelvis was taken and the leg lengths were measured and found to be equal. Hip was then dislocated again and the femoral head and neck removed. The  femoral broach was removed. Size 13 Corail stem with a standard offset  neck was then impacted into the femur following native anteversion. Has  excellent purchase in the canal. Excellent torsional and rotational and  axial stability. It is confirmed to be in the canal on AP and lateral  fluoroscopic views. The 32 + 1 ceramic head was placed and the hip  reduced with outstanding stability. Again AP pelvis was taken and it  confirmed that the leg lengths were equal. The wound was then copiously  irrigated with saline solution and the capsule reattached and repaired  with Ethibond suture. 30 ml of .25% Bupivicaine was  injected into the capsule and into the edge of the tensor fascia lata as well as subcutaneous tissue. The fascia overlying the tensor fascia lata was then closed with a running #1 V-Loc. Subcu was closed with interrupted 2-0 Vicryl and subcuticular running 4-0 Monocryl. Incision was cleaned  and dried. Steri-Strips and a bulky sterile dressing applied. Hemovac  drain was hooked to suction and then the patient was awakened and transported to  recovery in stable condition.        Please note that a surgical assistant was a medical necessity for this procedure to perform it in a safe and expeditious manner. Assistant was necessary to provide appropriate retraction of vital neurovascular structures and to prevent femoral fracture and allow for anatomic placement of the prosthesis.  Gaynelle Arabian, M.D.

## 2016-08-07 NOTE — Transfer of Care (Signed)
Immediate Anesthesia Transfer of Care Note  Patient: Kristin [name]  Procedure(s) Performed: Procedure(s): LEFT TOTAL HIP ARTHROPLASTY ANTERIOR APPROACH (Left)  Patient Location: PACU  Anesthesia Type:Spinal  Level of Consciousness: awake, alert  and oriented  Airway & Oxygen Therapy: Patient Spontanous Breathing and Patient connected to face mask oxygen  Post-op Assessment: Report given to RN, Post -op Vital signs reviewed and stable and ablel to bend knees.  Post vital signs: Reviewed and stable  Last Vitals:  Vitals:   08/07/16 0847  BP: (!) 172/55  Pulse: (!) 59  Resp: 16  Temp: 36.6 C    Last Pain:  Vitals:   08/07/16 0945  TempSrc:   PainSc: 0-No pain      Patients Stated Pain Goal: 4 (15/83/09 4076)  Complications: No apparent anesthesia complications

## 2016-08-07 NOTE — H&P (View-Only) (Signed)
Kristin [name] DOB: 1941/06/03 Married / Language: English / Race: White Female Date of Admission:  08/07/2016 CC:  Left Hip Pain History of Present Illness  The patient is a 75 year old female who comes in for a preoperative History and Physical. The patient is scheduled for a left total hip arthroplasty (anterior) to be performed by Dr. Dione Plover. Kristin [name], Kristin [name] at [facility] on 08-07-2016. The patient is a 75 year old female who presented with a hip problem. The patient was seen in referral from Dr. Eber Hong at [facility].The patient reports left hip and left lateral hip problems including pain, weakness, giving way and tenderness. The patient reports symptoms radiating to the: left thigh.The symptoms are described as moderate in severity.The patient feels as if their symptoms are does feel they are worsening. Symptoms are relieved by nonsteroidal anti-inflammatory drugs (advil). Current treatment includes use of a walker (cane(because the left hip giveaway)) and nonsteroidal anti-inflammatory drugs. This problem has not been previously evaluated. This problem has not been previously treated. Kristin [name] presents today with a chief complaint of left hip pain. She reports that she has had this for quite some time. No injury that she can recall. She has not had any falls. It has progressed to the point where she is having to walk with a cane because she feels like her hip is ""getting away."" She has tried some anti-inflammatories in the form of Advil, with minimal improvement. She is having a lot of pain along the lateral aspect of her left hip, minimal groin pain. It does not typically radiate down her leg. No low back pain. No numbness or tingling. It has been progressive over the past year. She has had two I-A hip injections, the first which helped and the second did not help much at all. She is now ready to proceed with surgery at this time. They have been treated conservatively in the  past for the above stated problem and despite conservative measures, they continue to have progressive pain and severe functional limitations and dysfunction. They have failed non-operative management including home exercise, medications, and injections. It is felt that they would benefit from undergoing total joint replacement. Risks and benefits of the procedure have been discussed with the patient and they elect to proceed with surgery. There are no active contraindications to surgery such as ongoing infection or rapidly progressive neurological disease.  Problem List/Past Medical Primary osteoarthritis of left hip (M16.12)  Breast Cancer  Lung Cancer  Colon Cancer  Cerebrovascular Accident  2014 Hiatal Hernia  Gastroesophageal Reflux Disease  High blood pressure  Glaucoma  Cataract  Measles  Mumps  Allergies SulfADIAZINE *Sulfonamides**  Rash.  Family History Cancer  father, sister and grandmother fathers side Hypertension  sister  Social History  Illicit drug use  no Living situation  live with spouse Marital status  married Drug/Alcohol Rehab (Currently)  no Drug/Alcohol Rehab (Previously)  no Exercise  Exercises never Number of flights of stairs before winded  1 Pain Contract  no Tobacco use  never smoker Current work status  retired Alcohol use  current drinker; only occasionally per week Children  2 Advance Directives  Living Will, Healthcare POA  Medication History Lisinopril ('20MG'$  Tablet, Oral) Active. Zolpidem Tartrate ('5MG'$  Tablet, Oral) Active. AmLODIPine Besylate ('5MG'$  Tablet, Oral) Active. Tarceva ('150MG'$  Tablet, Oral) Active. Folivane-F (125-'1MG'$  Capsule, Oral) Active. Famotidine ('40MG'$  Tablet, Oral) Active. Escitalopram Oxalate ('10MG'$  Tablet, Oral) Active. Clopidogrel Bisulfate ('75MG'$  Tablet, Oral) Active. Combigan (0.2-0.5% Solution, Ophthalmic)  Active. Letrozole (2.'5MG'$  Tablet, Oral) Active. Calcium-Vitamin D (Oral)  Specific strength unknown - Active. Loratadine ('10MG'$  Tablet, Oral) Active.   Past Surgical History Tubal Ligation  Appendectomy  Breast Reconstruction  bilateral Mastectomy - Bilateral  Date: 2002. bilateral Lung Cancer Surgery  Date: 2012. Exploratory Surgery Partial Colectomy  Date: 2017.   Review of Systems  General Not Present- Chills, Fatigue, Fever, Memory Loss, Night Sweats, Weight Gain and Weight Loss. Skin Not Present- Eczema, Hives, Itching, Lesions and Rash. HEENT Not Present- Dentures, Double Vision, Headache, Hearing Loss, Tinnitus and Visual Loss. Respiratory Not Present- Allergies, Chronic Cough, Coughing up blood, Shortness of breath at rest and Shortness of breath with exertion. Cardiovascular Not Present- Chest Pain, Difficulty Breathing Lying Down, Murmur, Palpitations, Racing/skipping heartbeats and Swelling. Gastrointestinal Not Present- Abdominal Pain, Bloody Stool, Constipation, Diarrhea, Difficulty Swallowing, Heartburn, Jaundice, Loss of appetitie, Nausea and Vomiting. Female Genitourinary Not Present- Blood in Urine, Discharge, Flank Pain, Incontinence, Painful Urination, Urgency, Urinary frequency, Urinary Retention, Urinating at Night and Weak urinary stream. Musculoskeletal Present- Joint Pain. Not Present- Back Pain, Joint Swelling, Morning Stiffness, Muscle Pain, Muscle Weakness and Spasms. Neurological Not Present- Blackout spells, Difficulty with balance, Dizziness, Paralysis, Tremor and Weakness. Psychiatric Not Present- Insomnia.  Vitals Weight: 133 lb Height: 68in Weight was reported by patient. Height was reported by patient. Body Surface Area: 1.72 m Body Mass Index: 20.22 kg/m  Pulse: 56 (Regular)  BP: 118/60 (Sitting, Left Arm, Standard) Patient tends to avoid the right arm for BP's  Physical Exam General Mental Status -Alert, cooperative and good historian. General Appearance-pleasant, Not in acute  distress. Orientation-Oriented X3. Build & Nutrition-Well nourished and Well developed.  Head and Neck Head-normocephalic, atraumatic . Neck Global Assessment - supple, no bruit auscultated on the right, no bruit auscultated on the left.  Eye Vision-Wears corrective lenses(readers only). Pupil - Bilateral-Regular and Round. Motion - Bilateral-EOMI.  Chest and Lung Exam Auscultation Breath sounds - clear at anterior chest wall and clear at posterior chest wall. Adventitious sounds - No Adventitious sounds.  Cardiovascular Auscultation Rhythm - Regular rate and rhythm. Heart Sounds - S1 WNL and S2 WNL. Murmurs & Other Heart Sounds - Auscultation of the heart reveals - No Murmurs.  Abdomen Palpation/Percussion Tenderness - Abdomen is non-tender to palpation. Rigidity (guarding) - Abdomen is soft. Auscultation Auscultation of the abdomen reveals - Bowel sounds normal.  Female Genitourinary Note: Not done, not pertinent to present illness   Musculoskeletal Note: On exam, she is alert and oriented, in no apparent distress. Left hip can be flexed to 100. No internal rotation, about 10 degrees of external rotation and 10 to 20 degrees of abduction. Right hip range of motion is normal.  RADIOGRAPHS We did review her x-rays. She does have bone on bone arthritis of the left hip with subchondral cystic formation. There is also osteophyte formation present. Her radiographs have shown worsening arthritis compared to a year ago.  Assessment & Plan  Primary osteoarthritis of left hip (M16.12)  Note:Surgical Plans: Left Total Hip Replacement - Anterior Approach  Disposition: Home  PCP: Dr. Brynda Greathouse - [place]  Topical TXA - Cancer  Anesthesia Issues: None with medications DIFFICULT AIRWAY PATIENT  Bundle Progam Patient Plan for Home with HHPT  Signed electronically by Ok Edwards, III PA-C

## 2016-08-07 NOTE — Anesthesia Preprocedure Evaluation (Signed)
Anesthesia Evaluation  Patient identified by MRN, date of birth, ID band Patient awake    Reviewed: Allergy & Precautions, NPO status , Patient's Chart, lab work & pertinent test results  Airway Mallampati: I  TM Distance: >3 FB Neck ROM: Full    Dental  (+) Teeth Intact, Dental Advisory Given   Pulmonary    breath sounds clear to auscultation       Cardiovascular hypertension, Pt. on medications  Rhythm:Regular Rate:Normal     Neuro/Psych    GI/Hepatic hiatal hernia, GERD  Medicated and Controlled,  Endo/Other    Renal/GU      Musculoskeletal   Abdominal   Peds  Hematology   Anesthesia Other Findings   Reproductive/Obstetrics                             Anesthesia Physical Anesthesia Plan  ASA: II  Anesthesia Plan: Spinal   Post-op Pain Management:    Induction: Intravenous  Airway Management Planned: Simple Face Mask  Additional Equipment:   Intra-op Plan:   Post-operative Plan:   Informed Consent: I have reviewed the patients History and Physical, chart, labs and discussed the procedure including the risks, benefits and alternatives for the proposed anesthesia with the patient or authorized representative who has indicated his/her understanding and acceptance.   Dental advisory given  Plan Discussed with: CRNA, Anesthesiologist and Surgeon  Anesthesia Plan Comments:         Anesthesia Quick Evaluation

## 2016-08-07 NOTE — Interval H&P Note (Signed)
History and Physical Interval Note:  08/07/2016 9:34 AM  Kristin [name]  has presented today for surgery, with the diagnosis of OA LEFT HIP  The various methods of treatment have been discussed with the patient and family. After consideration of risks, benefits and other options for treatment, the patient has consented to  Procedure(s): LEFT TOTAL HIP ARTHROPLASTY ANTERIOR APPROACH (Left) as a surgical intervention .  The patient's history has been reviewed, patient examined, no change in status, stable for surgery.  I have reviewed the patient's chart and labs.  Questions were answered to the patient's satisfaction.     [name]

## 2016-08-07 NOTE — Anesthesia Procedure Notes (Signed)
Spinal  Start time: 08/07/2016 10:05 AM End time: 08/07/2016 10:09 AM Staffing Anesthesiologist: CREWS, DAVID Resident/CRNA: Lajuana Carry E Performed: resident/CRNA  Preanesthetic Checklist Completed: patient identified, site marked, surgical consent, pre-op evaluation, timeout performed, IV checked, risks and benefits discussed and monitors and equipment checked Spinal Block Patient position: sitting Prep: Betadine Patient monitoring: heart rate, continuous pulse ox and blood pressure Approach: right paramedian Location: L3-4 Injection technique: single-shot Needle Needle type: Pencan  Needle gauge: 24 G Needle length: 9 cm Additional Notes Kit expiration checked/confirmed. Sitting position, first attempt midline, 2nd attempt paramedian w/ clear CSF pre/post injection. Tol well, return to supine.

## 2016-08-07 NOTE — Anesthesia Postprocedure Evaluation (Signed)
Anesthesia Post Note  Patient: Kristin [name]  Procedure(s) Performed: Procedure(s) (LRB): LEFT TOTAL HIP ARTHROPLASTY ANTERIOR APPROACH (Left)  Patient location during evaluation: PACU Anesthesia Type: Spinal Level of consciousness: oriented and awake and alert Pain management: pain level controlled Vital Signs Assessment: post-procedure vital signs reviewed and stable Respiratory status: spontaneous breathing, respiratory function stable and patient connected to nasal cannula oxygen Cardiovascular status: blood pressure returned to baseline and stable Postop Assessment: no headache and no backache Anesthetic complications: no    Last Vitals:  Vitals:   08/07/16 1405 08/07/16 1505  BP: (!) 141/42 (!) 121/42  Pulse: 64 60  Resp: 15 15  Temp: 36.7 C 36.7 C    Last Pain:  Vitals:   08/07/16 1505  TempSrc: Oral  PainSc:                  Kalayah Leske A

## 2016-08-07 NOTE — Evaluation (Signed)
Physical Therapy Evaluation Patient Details Name: Kristin [name] MRN: [id number] DOB: 04/06/1941 Today's Date: 08/07/2016   History of Present Illness  Pt s/p L THR  Clinical Impression  Pt s/p L THR presents with decreased L LE strength/ROM and post op pain limiting functional mobility.  Pt should progress to dc home with family assist and HHPT follow up.    Follow Up Recommendations Home health PT    Equipment Recommendations  Rolling walker with 5" wheels    Recommendations for Other Services OT consult     Precautions / Restrictions Precautions Precautions: Fall Restrictions Weight Bearing Restrictions: No      Mobility  Bed Mobility Overal bed mobility: Needs Assistance Bed Mobility: Supine to Sit     Supine to sit: Min assist     General bed mobility comments: cues for sequence and use of R LE to self assist  Transfers Overall transfer level: Needs assistance Equipment used: Rolling walker (2 wheeled) Transfers: Sit to/from Stand Sit to Stand: Min assist         General transfer comment: cues for LE management and use of UEs to self assist  Ambulation/Gait Ambulation/Gait assistance: Min assist Ambulation Distance (Feet): 90 Feet Assistive device: Rolling walker (2 wheeled) Gait Pattern/deviations: Step-to pattern;Step-through pattern;Shuffle;Trunk flexed Gait velocity: decr Gait velocity interpretation: Below normal speed for age/gender General Gait Details: cues for posture, position from RW and initial sequence  Stairs            Wheelchair Mobility    Modified Rankin (Stroke Patients Only)       Balance                                             Pertinent Vitals/Pain Pain Assessment: 0-10 Pain Score: 2  Pain Location: L hip Pain Descriptors / Indicators: Aching;Sore Pain Intervention(s): Limited activity within patient's tolerance;Monitored during session;Premedicated before session;Ice applied    Home  Living Family/patient expects to be discharged to:: Private residence Living Arrangements: Spouse/significant other Available Help at Discharge: Family Type of Home: House Home Access: Level entry     Home Layout: Two level [place]: [place] - single point      Prior Function Level of Independence: Independent               Hand Dominance        Extremity/Trunk Assessment   Upper Extremity Assessment: Overall WFL for tasks assessed           Lower Extremity Assessment: LLE deficits/detail      Cervical / Trunk Assessment: Normal  Communication   Communication: No difficulties  Cognition Arousal/Alertness: Awake/alert Behavior During Therapy: WFL for tasks assessed/performed Overall Cognitive Status: Within Functional Limits for tasks assessed                      General Comments      Exercises Total Joint Exercises Ankle Circles/Pumps: AROM;Both;15 reps;Supine   Assessment/Plan    PT Assessment Patient needs continued PT services  PT Problem List Decreased strength;Decreased range of motion;Decreased activity tolerance;Decreased mobility;Decreased knowledge of use of DME;Pain          PT Treatment Interventions DME instruction;Gait training;Stair training;Functional mobility training;Therapeutic activities;Therapeutic exercise;Patient/family education    PT Goals (Current goals can be found in the Care Plan section)  Acute Rehab PT Goals Patient Stated Goal: Regain  IND PT Goal Formulation: With patient Time For Goal Achievement: 08/10/16 Potential to Achieve Goals: Good    Frequency 7X/week   Barriers to discharge        Co-evaluation               End of Session Equipment Utilized During Treatment: Gait belt Activity Tolerance: Patient tolerated treatment well Patient left: in chair;with call bell/phone within reach;with family/visitor present Nurse Communication: Mobility status         Time: 1540-1610 PT Time  Calculation (min) (ACUTE ONLY): 30 min   Charges:   PT Evaluation $PT Eval Low Complexity: 1 Procedure PT Treatments $Gait Training: 8-22 mins   PT G Codes:        Kristin [name] 08/15/2016, 4:50 PM

## 2016-08-08 LAB — CBC
HCT: 28.7 % — ABNORMAL LOW (ref 36.0–46.0)
Hemoglobin: 9.2 g/dL — ABNORMAL LOW (ref 12.0–15.0)
MCH: 28.9 pg (ref 26.0–34.0)
MCHC: 32.1 g/dL (ref 30.0–36.0)
MCV: 90.3 fL (ref 78.0–100.0)
PLATELETS: 342 10*3/uL (ref 150–400)
RBC: 3.18 MIL/uL — ABNORMAL LOW (ref 3.87–5.11)
RDW: 14.2 % (ref 11.5–15.5)
WBC: 11.1 10*3/uL — AB (ref 4.0–10.5)

## 2016-08-08 LAB — BASIC METABOLIC PANEL
ANION GAP: 3 — AB (ref 5–15)
BUN: 22 mg/dL — ABNORMAL HIGH (ref 6–20)
CALCIUM: 9.1 mg/dL (ref 8.9–10.3)
CO2: 25 mmol/L (ref 22–32)
Chloride: 109 mmol/L (ref 101–111)
Creatinine, Ser: 0.91 mg/dL (ref 0.44–1.00)
GFR, EST NON AFRICAN AMERICAN: 59 mL/min — AB (ref >60)
GLUCOSE: 140 mg/dL — AB (ref 65–99)
Potassium: 4.7 mmol/L (ref 3.5–5.1)
SODIUM: 137 mmol/L (ref 135–145)

## 2016-08-08 MED ORDER — ONDANSETRON HCL 4 MG PO TABS: 4.0000 mg | ORAL_TABLET | Freq: Four times a day (QID) | ORAL | 0 refills | Status: DC | PRN

## 2016-08-08 MED ORDER — OXYCODONE HCL 5 MG PO TABS: 5.0000 mg | ORAL_TABLET | ORAL | 0 refills | Status: DC | PRN

## 2016-08-08 MED ORDER — METHOCARBAMOL 500 MG PO TABS: 500.0000 mg | ORAL_TABLET | Freq: Four times a day (QID) | ORAL | 0 refills | Status: DC | PRN

## 2016-08-08 MED ORDER — RIVAROXABAN 10 MG PO TABS: 10.0000 mg | ORAL_TABLET | Freq: Every day | ORAL | 0 refills | Status: DC

## 2016-08-08 MED ORDER — ERLOTINIB HCL 150 MG PO TABS: 150.0000 mg | ORAL_TABLET | Freq: Every day | ORAL | Status: DC

## 2016-08-08 NOTE — Progress Notes (Signed)
Subjective: 1 Day Post-Op Procedure(s) (LRB): LEFT TOTAL HIP ARTHROPLASTY ANTERIOR APPROACH (Left) Patient reports pain as mild.   Patient seen in rounds by Dr. Wynelle Link. Patient is well, and has had no acute complaints or problems We will start therapy today.  If they do well with therapy and meets all goals, then will allow home later this afternoon following therapy. Plan is to go Home after hospital stay.  Objective: Vital signs in last 24 hours: Temp:  [97.5 F (36.4 C)-98.8 F (37.1 C)] 98.5 F (36.9 C) (10/19 1610) Pulse Rate:  [54-64] 63 (10/19 0613) Resp:  [12-17] 16 (10/19 0613) BP: (118-172)/(42-65) 136/52 (10/19 0613) SpO2:  [98 %-100 %] 99 % (10/19 9604) Weight:  [59.9 kg (132 lb)] 59.9 kg (132 lb) (10/18 1305)  Intake/Output from previous day:  Intake/Output Summary (Last 24 hours) at 08/08/16 0735 Last data filed at 08/08/16 5409  Gross per 24 hour  Intake           2939.5 ml  Output             2285 ml  Net            654.5 ml    Intake/Output this shift: No intake/output data recorded.  Labs:  Recent Labs  08/08/16 0405  HGB 9.2*    Recent Labs  08/08/16 0405  WBC 11.1*  RBC 3.18*  HCT 28.7*  PLT 342    Recent Labs  08/08/16 0405  NA 137  K 4.7  CL 109  CO2 25  BUN 22*  CREATININE 0.91  GLUCOSE 140*  CALCIUM 9.1   No results for input(s): LABPT, INR in the last 72 hours.  EXAM General - Patient is Alert, Appropriate and Oriented Extremity - Neurovascular intact Sensation intact distally Dressing - dressing C/D/I Motor Function - intact, moving foot and toes well on exam.  Hemovac pulled without difficulty.  Past Medical History:  Diagnosis Date  . Anemia   . Anxiety   . Arthritis    arthritis- left hip  . Blood transfusion without reported diagnosis    transfusion- 3-4 yrs ago -found to be anemic on routine lab check  . Breast cancer ([place]) 2002   bilateral- bilateral mastectomies done.  . 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 CVA (cerebral infarction) 04/30/2013  . Family history of brain cancer   . Family history of breast cancer   . Family history of colon cancer 10/06/2009  . Family history of colon cancer   . Family history of prostate cancer   . GERD (gastroesophageal reflux disease)   . Glaucoma   . History of bilateral mastectomy 05/20/2010  . Hypertension   . Lung cancer ([place]) dx'd 12/2010   last Ct scan "no lung cancer" showing 11'16 CT Chest Epic.  . Malignant neoplasm of ascending colon ([place]) 01/29/2016  . Stroke [facility])    2 yrs ago-no residual  . Tubular adenoma of colon 07/2011   colon polyps ans reoccurence with malignancy found    Assessment/Plan: 1 Day Post-Op Procedure(s) (LRB): LEFT TOTAL HIP ARTHROPLASTY ANTERIOR APPROACH (Left) Principal Problem:   OA (osteoarthritis) of hip  Estimated body mass index is 20.67 kg/m as calculated from the following:   Height as of this encounter: '5\' 7"'$  (1.702 m).   Weight as of this encounter: 59.9 kg (132 lb). Advance diet Up with therapy Discharge home with home health  DVT Prophylaxis - Xarelto Weight Bearing As Tolerated left Leg Hemovac Pulled Begin Therapy  If meets goals and able to go home: Discharge home with home health Diet - Cardiac diet Follow up - in 2 weeks Activity - WBAT Disposition - Home Condition Upon Discharge - Good D/C Meds - See DC Summary DVT Prophylaxis - Xarelto  Arlee Muslim, PA-C Orthopaedic Surgery 08/08/2016, 7:35 AM

## 2016-08-08 NOTE — Discharge Instructions (Addendum)
° °Dr. Frank Aluisio °Total Joint Specialist °[facility] °[address] °[place], [place] [address] °[phone] ° °ANTERIOR APPROACH TOTAL HIP REPLACEMENT POSTOPERATIVE DIRECTIONS ° ° °Hip Rehabilitation, Guidelines Following Surgery  °The results of a hip operation are greatly improved after range of motion and muscle strengthening exercises. Follow all safety measures which are given to protect your hip. If any of these exercises cause increased pain or swelling in your joint, decrease the amount until you are comfortable again. Then slowly increase the exercises. Call your caregiver if you have problems or questions.  ° °HOME CARE INSTRUCTIONS  °Remove items at home which could result in a fall. This includes throw rugs or furniture in walking pathways.  °· ICE to the affected hip every three hours for 30 minutes at a time and then as needed for pain and swelling.  Continue to use ice on the hip for pain and swelling from surgery. You may notice swelling that will progress down to the foot and ankle.  This is normal after surgery.  Elevate the leg when you are not up walking on it.   °· Continue to use the breathing machine which will help keep your temperature down.  It is common for your temperature to cycle up and down following surgery, especially at night when you are not up moving around and exerting yourself.  The breathing machine keeps your lungs expanded and your temperature down. ° ° °DIET °You may resume your previous home diet once your are discharged from the hospital. ° °DRESSING / WOUND CARE / SHOWERING °You may shower 3 days after surgery, but keep the wounds dry during showering.  You may use an occlusive plastic wrap (Press'n Seal for example), NO SOAKING/SUBMERGING IN THE BATHTUB.  If the bandage gets wet, change with a clean dry gauze.  If the incision gets wet, pat the wound dry with a clean towel. °You may start showering once you are discharged home but do not  submerge the incision under water. Just pat the incision dry and apply a dry gauze dressing on daily. °Change the surgical dressing daily and reapply a dry dressing each time. ° °ACTIVITY °Walk with your walker as instructed. °Use walker as long as suggested by your caregivers. °Avoid periods of inactivity such as sitting longer than an hour when not asleep. This helps prevent blood clots.  °You may resume a sexual relationship in one month or when given the OK by your doctor.  °You may return to work once you are cleared by your doctor.  °Do not drive a car for 6 weeks or until released by you surgeon.  °Do not drive while taking narcotics. ° °WEIGHT BEARING °Weight bearing as tolerated with assist device (walker, cane, etc) as directed, use it as long as suggested by your surgeon or therapist, typically at least 4-6 weeks. ° °POSTOPERATIVE CONSTIPATION PROTOCOL °Constipation - defined medically as fewer than three stools per week and severe constipation as less than one stool per week. ° °One of the most common issues patients have following surgery is constipation.  Even if you have a regular bowel pattern at home, your normal regimen is likely to be disrupted due to multiple reasons following surgery.  Combination of anesthesia, postoperative narcotics, change in appetite and fluid intake all can affect your bowels.  In order to avoid complications following surgery, here are some recommendations in order to help you during your recovery period. ° °Colace (docusate) - Pick up an over-the-counter   form of Colace or another stool softener and take twice a day as long as you are requiring postoperative pain medications.  Take with a full glass of water daily.  If you experience loose stools or diarrhea, hold the colace until you stool forms back up.  If your symptoms do not get better within 1 week or if they get worse, check with your doctor. ° °Dulcolax (bisacodyl) - Pick up over-the-counter and take as directed  by the product packaging as needed to assist with the movement of your bowels.  Take with a full glass of water.  Use this product as needed if not relieved by Colace only.  ° °MiraLax (polyethylene glycol) - Pick up over-the-counter to have on hand.  MiraLax is a solution that will increase the amount of water in your bowels to assist with bowel movements.  Take as directed and can mix with a glass of water, juice, soda, coffee, or tea.  Take if you go more than two days without a movement. °Do not use MiraLax more than once per day. Call your doctor if you are still constipated or irregular after using this medication for 7 days in a row. ° °If you continue to have problems with postoperative constipation, please contact the office for further assistance and recommendations.  If you experience "the worst abdominal pain ever" or develop nausea or vomiting, please contact the office immediatly for further recommendations for treatment. ° °ITCHING ° If you experience itching with your medications, try taking only a single pain pill, or even half a pain pill at a time.  You can also use Benadryl over the counter for itching or also to help with sleep.  ° °TED HOSE STOCKINGS °Wear the elastic stockings on both legs for three weeks following surgery during the day but you may remove then at night for sleeping. ° °MEDICATIONS °See your medication summary on the “After Visit Summary” that the nursing staff will review with you prior to discharge.  You may have some home medications which will be placed on hold until you complete the course of blood thinner medication.  It is important for you to complete the blood thinner medication as prescribed by your surgeon.  Continue your approved medications as instructed at time of discharge. ° °PRECAUTIONS °If you experience chest pain or shortness of breath - call 911 immediately for transfer to the hospital emergency department.  °If you develop a fever greater that 101 F,  purulent drainage from wound, increased redness or drainage from wound, foul odor from the wound/dressing, or calf pain - CONTACT YOUR SURGEON.   °                                                °FOLLOW-UP APPOINTMENTS °Make sure you keep all of your appointments after your operation with your surgeon and caregivers. You should call the office at the above phone number and make an appointment for approximately two weeks after the date of your surgery or on the date instructed by your surgeon outlined in the "After Visit Summary". ° °RANGE OF MOTION AND STRENGTHENING EXERCISES  °These exercises are designed to help you keep full movement of your hip joint. Follow your caregiver's or physical therapist's instructions. Perform all exercises about fifteen times, three times per day or as directed. Exercise both hips, even if you   have had only one joint replacement. These exercises can be done on a training (exercise) mat, on the floor, on a table or on a bed. Use whatever works the best and is most comfortable for you. Use music or television while you are exercising so that the exercises are a pleasant break in your day. This will make your life better with the exercises acting as a break in routine you can look forward to.  Lying on your back, slowly slide your foot toward your buttocks, raising your knee up off the floor. Then slowly slide your foot back down until your leg is straight again.  Lying on your back spread your legs as far apart as you can without causing discomfort.  Lying on your side, raise your upper leg and foot straight up from the floor as far as is comfortable. Slowly lower the leg and repeat.  Lying on your back, tighten up the muscle in the front of your thigh (quadriceps muscles). You can do this by keeping your leg straight and trying to raise your heel off the floor. This helps strengthen the largest muscle supporting your knee.  Lying on your back, tighten up the muscles of your  buttocks both with the legs straight and with the knee bent at a comfortable angle while keeping your heel on the floor.   IF YOU ARE TRANSFERRED TO A SKILLED REHAB FACILITY If the patient is transferred to a skilled rehab facility following release from the hospital, a list of the current medications will be sent to the facility for the patient to continue.  When discharged from the skilled rehab facility, please have the facility set up the patient's [place] prior to being released. Also, the skilled facility will be responsible for providing the patient with their medications at time of release from the facility to include their pain medication, the muscle relaxants, and their blood thinner medication. If the patient is still at the rehab facility at time of the two week follow up appointment, the skilled rehab facility will also need to assist the patient in arranging follow up appointment in our office and any transportation needs.  MAKE SURE YOU:  Understand these instructions.  Get help right away if you are not doing well or get worse.    Pick up stool softner and laxative for home use following surgery while on pain medications. Do not submerge incision under water. Please use good hand washing techniques while changing dressing each day. May shower starting three days after surgery. Please use a clean towel to pat the incision dry following showers. Continue to use ice for pain and swelling after surgery. Do not use any lotions or creams on the incision until instructed by your surgeon.  Take Xarelto for two and a half more weeks, then discontinue Xarelto. Once the patient has completed the Xarelto, they may resume the Plavix 75 mg daily at home. Information on my medicine - XARELTO (Rivaroxaban)  This medication education was reviewed with me or my healthcare representative as part of my discharge preparation.  The pharmacist that spoke with me during my  hospital stay was:  Sallyanne Havers, Student-PharmD  Why was Xarelto prescribed for you? Xarelto was prescribed for you to reduce the risk of blood clots forming after orthopedic surgery. The medical term for these abnormal blood clots is venous thromboembolism (VTE).  What do you need to know about xarelto ? Take your Xarelto ONCE DAILY at the same time every  day. You may take it either with or without food.  If you have difficulty swallowing the tablet whole, you may crush it and mix in applesauce just prior to taking your dose.  Take Xarelto exactly as prescribed by your doctor and DO NOT stop taking Xarelto without talking to the doctor who prescribed the medication.  Stopping without other VTE prevention medication to take the place of Xarelto may increase your risk of developing a clot.  After discharge, you should have regular check-up appointments with your healthcare provider that is prescribing your Xarelto.    What do you do if you miss a dose? If you miss a dose, take it as soon as you remember on the same day then continue your regularly scheduled once daily regimen the next day. Do not take two doses of Xarelto on the same day.   Important Safety Information A possible side effect of Xarelto is bleeding. You should call your healthcare provider right away if you experience any of the following: ? Bleeding from an injury or your nose that does not stop. ? Unusual colored urine (red or dark brown) or unusual colored stools (red or black). ? Unusual bruising for unknown reasons. ? A serious fall or if you hit your head (even if there is no bleeding).  Some medicines may interact with Xarelto and might increase your risk of bleeding while on Xarelto. To help avoid this, consult your healthcare provider or pharmacist prior to using any new prescription or non-prescription medications, including herbals, vitamins, non-steroidal anti-inflammatory drugs (NSAIDs) and  supplements.  This website has more information on Xarelto: [email or web address]

## 2016-08-08 NOTE — Evaluation (Signed)
   Occupational Therapy Evaluation Patient Details Name: Kristin [name] MRN: [id number] DOB: 01/02/1941 Today's Date: 2016-08-12    History of Present Illness Pt s/p L THR   Clinical Impression   OT education complete regarding ADL activity s/p THA    Follow Up Recommendations  No OT follow up    Equipment Recommendations  None recommended by OT    Recommendations for Other Services       Precautions / Restrictions Restrictions Weight Bearing Restrictions: No      Mobility Bed Mobility               General bed mobility comments: pt in chair  Transfers Overall transfer level: Needs assistance Equipment used: Rolling walker (2 wheeled) Transfers: Sit to/from [facility] Sit to Stand: Supervision Stand pivot transfers: Supervision                 ADL Overall ADL's : Needs assistance/impaired     Grooming: Standing;Supervision/safety           Upper Body Dressing : Set up;Sitting   Lower Body Dressing: Minimal assistance;Sit to/from stand;Cueing for safety;Cueing for sequencing;Cueing for compensatory techniques   Toilet Transfer: Supervision/safety;RW;Ambulation;Cueing for sequencing;Comfort height toilet   Toileting- Clothing Manipulation and Hygiene: Supervision/safety;Sit to/from stand;Cueing for safety;Cueing for sequencing       Functional mobility during ADLs: Supervision/safety;Cueing for safety;Cueing for sequencing;Rolling walker       Vision     Perception     Praxis      Pertinent Vitals/Pain Pain Score: 2  Pain Location: L hip Pain Descriptors / Indicators: Sore Pain Intervention(s): Monitored during session     Hand Dominance     Extremity/Trunk Assessment         Cervical / Trunk Assessment Cervical / Trunk Assessment: Normal   Communication Communication Communication: No difficulties   Cognition Arousal/Alertness: Awake/alert Behavior During Therapy: WFL for tasks  assessed/performed Overall Cognitive Status: Within Functional Limits for tasks assessed                     General Comments       Exercises       Shoulder Instructions      Home Living Family/patient expects to be discharged to:: Private residence Living Arrangements: Spouse/significant other Available Help at Discharge: Family Type of Home: House Home Access: Level entry     Home Layout: Two level Alternate Level Stairs-Number of Steps: 1             Home Equipment: Cane - single point          Prior Functioning/Environment Level of Independence: Independent                 OT Problem List:     OT Treatment/Interventions:      OT Goals(Current goals can be found in the care plan section) Acute Rehab OT Goals Patient Stated Goal: Regain IND OT Goal Formulation: With patient  OT Frequency:     Barriers to D/C:            Co-evaluation              End of Session    Activity Tolerance: Patient tolerated treatment well Patient left: in chair;with call bell/phone within reach   Time: 1049-1103 OT Time Calculation (min): 14 min Charges:  OT Evaluation $OT Eval Moderate Complexity: 1 Procedure G-Codes:    Kristin [name] D 08/12/16, 11:28 AM

## 2016-08-08 NOTE — Progress Notes (Signed)
Physical Therapy Treatment Patient Details Name: Kristin [name] MRN: [id number] DOB: 1941/04/11 Today's Date: 08/08/2016    History of Present Illness Pt s/p L THR    PT Comments    Pt progressing well with mobility and eager for dc home.  Reviewed therex, stairs and car transfers  Follow Up Recommendations  Home health PT     Equipment Recommendations  Rolling walker with 5" wheels    Recommendations for Other Services OT consult     Precautions / Restrictions Precautions Precautions: Fall Restrictions Weight Bearing Restrictions: No    Mobility  Bed Mobility Overal bed mobility: Needs Assistance Bed Mobility: Supine to Sit;Sit to Supine     Supine to sit: Min assist Sit to supine: Min guard   General bed mobility comments: cues for sequence and use of R LE to self assist  Transfers Overall transfer level: Needs assistance Equipment used: Rolling walker (2 wheeled) Transfers: Sit to/from Stand Sit to Stand: Min guard;Supervision Stand pivot transfers: Supervision       General transfer comment: cues for LE management and use of UEs to self assist  Ambulation/Gait Ambulation/Gait assistance: Min guard;Supervision Ambulation Distance (Feet): 250 Feet Assistive device: Rolling walker (2 wheeled) Gait Pattern/deviations: Step-to pattern;Step-through pattern;Decreased step length - right;Decreased step length - left;Shuffle;Trunk flexed Gait velocity: decr Gait velocity interpretation: Below normal speed for age/gender General Gait Details: cues for posture, position from RW and initial sequence   Stairs Stairs: Yes Stairs assistance: Min assist   Number of Stairs: 2 General stair comments: single step twice with cues for seqeunce and foot/RW placement  Wheelchair Mobility    Modified Rankin (Stroke Patients Only)       Balance                                    Cognition Arousal/Alertness: Awake/alert Behavior During  Therapy: WFL for tasks assessed/performed Overall Cognitive Status: Within Functional Limits for tasks assessed                      Exercises Total Joint Exercises Ankle Circles/Pumps: AROM;Both;15 reps;Supine Quad Sets: AROM;Both;10 reps;Supine Heel Slides: AAROM;Left;20 reps;Supine Hip ABduction/ADduction: AAROM;Left;15 reps;Supine Long Arc Quad: AROM;Left;10 reps;Seated    General Comments        Pertinent Vitals/Pain Pain Assessment: 0-10 Pain Score: 2  Pain Location: L hip Pain Descriptors / Indicators: Aching;Sore Pain Intervention(s): Limited activity within patient's tolerance;Monitored during session;Premedicated before session;Ice applied    Home Living Family/patient expects to be discharged to:: Private residence Living Arrangements: Spouse/significant other Available Help at Discharge: Family Type of Home: House Home Access: Level entry   Home Layout: Two level [place]: [place] - single point      Prior Function Level of Independence: Independent          PT Goals (current goals can now be found in the care plan section) Acute Rehab PT Goals Patient Stated Goal: Regain IND PT Goal Formulation: With patient Time For Goal Achievement: 08/10/16 Potential to Achieve Goals: Good Progress towards PT goals: Progressing toward goals    Frequency    7X/week      PT Plan Current plan remains appropriate    Co-evaluation             End of Session Equipment Utilized During Treatment: Gait belt Activity Tolerance: Patient tolerated treatment well Patient left: in chair;with call bell/phone within reach;with family/visitor present  Time: 2010-0712 PT Time Calculation (min) (ACUTE ONLY): 31 min  Charges:  $Gait Training: 8-22 mins $Therapeutic Exercise: 8-22 mins                    G Codes:      Rayelle Armor August 18, 2016, 11:36 AM

## 2016-08-08 NOTE — Discharge Summary (Signed)
Physician Discharge Summary   Patient ID: Kristin [name] MRN: [id number] DOB/AGE: [birth year] 75 y.o.  Admit date: 08/07/2016 Discharge date: 08-08-2016  Primary Diagnosis:  Osteoarthritis of the Left hip.   Admission Diagnoses:  Past Medical History:  Diagnosis Date  . Anemia   . Anxiety   . Arthritis    arthritis- left hip  . Blood transfusion without reported diagnosis    transfusion- 3-4 yrs ago -found to be anemic on routine lab check  . Breast cancer ([place]) 2002   bilateral- bilateral mastectomies done.  . 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 CVA (cerebral infarction) 04/30/2013  . Family history of brain cancer   . Family history of breast cancer   . Family history of colon cancer 10/06/2009  . Family history of colon cancer   . Family history of prostate cancer   . GERD (gastroesophageal reflux disease)   . Glaucoma   . History of bilateral mastectomy 05/20/2010  . Hypertension   . Lung cancer ([place]) dx'd 12/2010   last Ct scan "no lung cancer" showing 11'16 CT Chest Epic.  . Malignant neoplasm of ascending colon ([place]) 01/29/2016  . Stroke [facility])    2 yrs ago-no residual  . Tubular adenoma of colon 07/2011   colon polyps ans reoccurence with malignancy found   Discharge Diagnoses:   Principal Problem:   OA (osteoarthritis) of hip  Estimated body mass index is 20.67 kg/m as calculated from the following:   Height as of this encounter: 5' 7"  (1.702 m).   Weight as of this encounter: 59.9 kg (132 lb).  Procedure(s) (LRB): LEFT TOTAL HIP ARTHROPLASTY ANTERIOR APPROACH (Left)   Consults: None  HPI: Kristin [name] is a 75 y.o. female who has advanced end-  stage arthritis of their Left  hip with progressively worsening pain and  dysfunction.The patient has failed nonoperative management and presents for  total hip arthroplasty.   Laboratory Data: Admission on 08/07/2016  Component Date Value Ref Range Status  . WBC 08/08/2016 11.1* 4.0 - 10.5 K/uL Final  . RBC  08/08/2016 3.18* 3.87 - 5.11 MIL/uL Final  . Hemoglobin 08/08/2016 9.2* 12.0 - 15.0 g/dL Final  . HCT 08/08/2016 28.7* 36.0 - 46.0 % Final  . MCV 08/08/2016 90.3  78.0 - 100.0 fL Final  . MCH 08/08/2016 28.9  26.0 - 34.0 pg Final  . MCHC 08/08/2016 32.1  30.0 - 36.0 g/dL Final  . RDW 08/08/2016 14.2  11.5 - 15.5 % Final  . Platelets 08/08/2016 342  150 - 400 K/uL Final  . Sodium 08/08/2016 137  135 - 145 mmol/L Final  . Potassium 08/08/2016 4.7  3.5 - 5.1 mmol/L Final  . Chloride 08/08/2016 109  101 - 111 mmol/L Final  . CO2 08/08/2016 25  22 - 32 mmol/L Final  . Glucose, Bld 08/08/2016 140* 65 - 99 mg/dL Final  . BUN 08/08/2016 22* 6 - 20 mg/dL Final  . Creatinine, Ser 08/08/2016 0.91  0.44 - 1.00 mg/dL Final  . Calcium 08/08/2016 9.1  8.9 - 10.3 mg/dL Final  . GFR calc non Af Amer 08/08/2016 59* >60 mL/min Final  . GFR calc Af Amer 08/08/2016 >60  >60 mL/min Final   Comment: (NOTE) The eGFR has been calculated using the CKD EPI equation. This calculation has not been validated in all clinical situations. eGFR's persistently <60 mL/min signify possible Chronic Kidney Disease.   Georgiann Hahn gap 08/08/2016 3* 5 - 15 Final  Hospital Outpatient Visit on 07/31/2016  Component Date  Value Ref Range Status  . aPTT 07/31/2016 31  24 - 36 seconds Final  . Prothrombin Time 07/31/2016 12.6  11.4 - 15.2 seconds Final  . INR 07/31/2016 0.94   Final  . ABO/RH(D) 08/07/2016 B POS   Final  . Antibody Screen 08/07/2016 NEG   Final  . Sample Expiration 08/07/2016 08/10/2016   Final  . Extend sample reason 08/07/2016 NO TRANSFUSIONS OR PREGNANCY IN THE PAST 3 MONTHS   Final  . Color, Urine 07/31/2016 YELLOW  YELLOW Final  . APPearance 07/31/2016 CLEAR  CLEAR Final  . Specific Gravity, Urine 07/31/2016 1.016  1.005 - 1.030 Final  . pH 07/31/2016 6.0  5.0 - 8.0 Final  . Glucose, UA 07/31/2016 NEGATIVE  NEGATIVE mg/dL Final  . Hgb urine dipstick 07/31/2016 NEGATIVE  NEGATIVE Final  . Bilirubin Urine  07/31/2016 NEGATIVE  NEGATIVE Final  . Ketones, ur 07/31/2016 NEGATIVE  NEGATIVE mg/dL Final  . Protein, ur 07/31/2016 NEGATIVE  NEGATIVE mg/dL Final  . Nitrite 07/31/2016 NEGATIVE  NEGATIVE Final  . Leukocytes, UA 07/31/2016 SMALL* NEGATIVE Final  . MRSA, PCR 07/31/2016 NEGATIVE  NEGATIVE Final  . Staphylococcus aureus 07/31/2016 POSITIVE* NEGATIVE Final   Comment:        The Xpert SA Assay (FDA approved for NASAL specimens in patients over 19 years of age), is one component of a comprehensive surveillance program.  Test performance has been validated by [facility] for patients greater than or equal to 54 year old. It is not intended to diagnose infection nor to guide or monitor treatment.   . Squamous Epithelial / LPF 07/31/2016 0-5* NONE SEEN Final  . WBC, UA 07/31/2016 6-30  0 - 5 WBC/hpf Final  . RBC / HPF 07/31/2016 NONE SEEN  0 - 5 RBC/hpf Final  . Bacteria, UA 07/31/2016 RARE* NONE SEEN Final  . ABO/RH(D) 07/31/2016 B POS   Final  Appointment on 07/31/2016  Component Date Value Ref Range Status  . WBC 07/31/2016 4.5  3.9 - 10.3 10e3/uL Final  . NEUT# 07/31/2016 2.5  1.5 - 6.5 10e3/uL Final  . HGB 07/31/2016 11.5* 11.6 - 15.9 g/dL Final  . HCT 07/31/2016 35.4  34.8 - 46.6 % Final  . Platelets 07/31/2016 419* 145 - 400 10e3/uL Final  . MCV 07/31/2016 88.3  79.5 - 101.0 fL Final  . MCH 07/31/2016 28.6  25.1 - 34.0 pg Final  . MCHC 07/31/2016 32.4  31.5 - 36.0 g/dL Final  . RBC 07/31/2016 4.01  3.70 - 5.45 10e6/uL Final  . RDW 07/31/2016 14.0  11.2 - 14.5 % Final  . lymph# 07/31/2016 1.0  0.9 - 3.3 10e3/uL Final  . MONO# 07/31/2016 0.7  0.1 - 0.9 10e3/uL Final  . Eosinophils Absolute 07/31/2016 0.2  0.0 - 0.5 10e3/uL Final  . Basophils Absolute 07/31/2016 0.1  0.0 - 0.1 10e3/uL Final  . NEUT% 07/31/2016 55.8  38.4 - 76.8 % Final  . LYMPH% 07/31/2016 22.6  14.0 - 49.7 % Final  . MONO% 07/31/2016 15.0* 0.0 - 14.0 % Final  . EOS% 07/31/2016 5.3  0.0 - 7.0 % Final  .  BASO% 07/31/2016 1.3  0.0 - 2.0 % Final  . Sodium 07/31/2016 139  136 - 145 mEq/L Final  . Potassium 07/31/2016 4.5  3.5 - 5.1 mEq/L Final  . Chloride 07/31/2016 107  98 - 109 mEq/L Final  . CO2 07/31/2016 26  22 - 29 mEq/L Final  . Glucose 07/31/2016 104  70 - 140 mg/dl Final  .  BUN 07/31/2016 14.3  7.0 - 26.0 mg/dL Final  . Creatinine 07/31/2016 0.8  0.6 - 1.1 mg/dL Final  . Total Bilirubin 07/31/2016 0.39  0.20 - 1.20 mg/dL Final  . Alkaline Phosphatase 07/31/2016 109  40 - 150 U/L Final  . AST 07/31/2016 19  5 - 34 U/L Final  . ALT 07/31/2016 13  0 - 55 U/L Final  . Total Protein 07/31/2016 6.4  6.4 - 8.3 g/dL Final  . Albumin 07/31/2016 3.2* 3.5 - 5.0 g/dL Final  . Calcium 07/31/2016 9.7  8.4 - 10.4 mg/dL Final  . Anion Gap 07/31/2016 7  3 - 11 mEq/L Final  . EGFR 07/31/2016 68* >90 ml/min/1.73 m2 Final  . CEA ([facility]) 07/31/2016 213.90* 0.00 - 5.00 ng/mL Final     X-Rays:Dg Pelvis Portable  Result Date: 08/07/2016 CLINICAL DATA:  75 year old female status post left hip surgery. Initial encounter. EXAM: PORTABLE PELVIS 1-2 VIEWS COMPARISON:  CT Abdomen and Pelvis [place] 01/15/2016 and earlier. FINDINGS: Portable AP view at 1137 hours. Sequelae of left hip arthroplasty, bipolar type. Hardware appears normally aligned in the AP view and intact. Postoperative drain in place. Postoperative changes to the surrounding left hip soft tissues. Elsewhere a the pelvis and proximal right femur appear stable. Benign bone island re- demonstrated in the left inferior pubic ramus. Small bilateral pelvic surgical clips. IMPRESSION: Left hip arthroplasty with no adverse features. Electronically Signed   By: Genevie Ann M.D.   On: 08/07/2016 12:20   Dg C-arm 1-60 Min-no Report  Result Date: 08/07/2016 CLINICAL DATA: surgery C-ARM 1-60 MINUTES Fluoroscopy was utilized by the requesting physician.  No radiographic interpretation.    EKG: Orders placed or performed in visit on 01/30/16    . EKG 12-Lead     Hospital Course: Patient was admitted to [facility] and taken to the OR and underwent the above state procedure without complications.  Patient tolerated the procedure well and was later transferred to the recovery room and then to the orthopaedic floor for postoperative care.  They were given PO and IV analgesics for pain control following their surgery.  They were given 24 hours of postoperative antibiotics of  Anti-infectives    Start     Dose/Rate Route Frequency Ordered Stop   08/07/16 1600  ceFAZolin (ANCEF) IVPB 2g/100 mL premix     2 g 200 mL/hr over 30 Minutes Intravenous Every 6 hours 08/07/16 1315 08/07/16 2300   08/07/16 0845  ceFAZolin (ANCEF) IVPB 2g/100 mL premix     2 g 200 mL/hr over 30 Minutes Intravenous On call to O.R. 08/07/16 0845 08/07/16 1030     and started on DVT prophylaxis in the form of Xarelto.   PT and OT were ordered for total hip protocol.  The patient was allowed to be WBAT with therapy. Discharge planning was consulted to help with postop disposition and equipment needs.  Patient had a good night on the evening of surgery.  They started to get up OOB with therapy on day one.  Hemovac drain was pulled without difficulty.  Dressing was checked on day one and was clean and dry. Patient was seen in rounds by Dr. Wynelle Link and was ready to go home later that same day following therapy goals.  Discharge home with home health Diet - Cardiac diet Follow up - in 2 weeks Activity - WBAT Disposition - Home Condition Upon Discharge - Good D/C Meds - See DC Summary DVT Prophylaxis - Xarelto  Discharge  Instructions    Call MD / Call 911    Complete by:  As directed    If you experience chest pain or shortness of breath, CALL 911 and be transported to the hospital emergency room.  If you develope a fever above 101 F, pus (white drainage) or increased drainage or redness at the wound, or calf pain, call your surgeon's office.   Change  dressing    Complete by:  As directed    You may change your dressing dressing daily with sterile 4 x 4 inch gauze dressing and paper tape.  Do not submerge the incision under water.   Constipation Prevention    Complete by:  As directed    Drink plenty of fluids.  Prune juice may be helpful.  You may use a stool softener, such as Colace (over the counter) 100 mg twice a day.  Use MiraLax (over the counter) for constipation as needed.   Diet - low sodium heart healthy    Complete by:  As directed    Discharge instructions    Complete by:  As directed    Pick up stool softner and laxative for home use following surgery while on pain medications. Do not submerge incision under water. Please use good hand washing techniques while changing dressing each day. May shower starting three days after surgery. Please use a clean towel to pat the incision dry following showers. Continue to use ice for pain and swelling after surgery. Do not use any lotions or creams on the incision until instructed by your surgeon.   Postoperative Constipation Protocol  Constipation - defined medically as fewer than three stools per week and severe constipation as less than one stool per week.  One of the most common issues patients have following surgery is constipation.  Even if you have a regular bowel pattern at home, your normal regimen is likely to be disrupted due to multiple reasons following surgery.  Combination of anesthesia, postoperative narcotics, change in appetite and fluid intake all can affect your bowels.  In order to avoid complications following surgery, here are some recommendations in order to help you during your recovery period.  Colace (docusate) - Pick up an over-the-counter form of Colace or another stool softener and take twice a day as long as you are requiring postoperative pain medications.  Take with a full glass of water daily.  If you experience loose stools or diarrhea, hold the  colace until you stool forms back up.  If your symptoms do not get better within 1 week or if they get worse, check with your doctor.  Dulcolax (bisacodyl) - Pick up over-the-counter and take as directed by the product packaging as needed to assist with the movement of your bowels.  Take with a full glass of water.  Use this product as needed if not relieved by Colace only.   MiraLax (polyethylene glycol) - Pick up over-the-counter to have on hand.  MiraLax is a solution that will increase the amount of water in your bowels to assist with bowel movements.  Take as directed and can mix with a glass of water, juice, soda, coffee, or tea.  Take if you go more than two days without a movement. Do not use MiraLax more than once per day. Call your doctor if you are still constipated or irregular after using this medication for 7 days in a row.  If you continue to have problems with postoperative constipation, please contact the office for further  assistance and recommendations.  If you experience "the worst abdominal pain ever" or develop nausea or vomiting, please contact the office immediatly for further recommendations for treatment.   Take Xarelto for two and a half more weeks, then discontinue Xarelto. Once the patient has completed the Xarelto, they may resume the Plavix 75 mg daily at home.   Do not sit on low chairs, stoools or toilet seats, as it may be difficult to get up from low surfaces    Complete by:  As directed    Driving restrictions    Complete by:  As directed    No driving until released by the physician.   Increase activity slowly as tolerated    Complete by:  As directed    Lifting restrictions    Complete by:  As directed    No lifting until released by the physician.   Patient may shower    Complete by:  As directed    You may shower without a dressing once there is no drainage.  Do not wash over the wound.  If drainage remains, do not shower until drainage stops.   TED  hose    Complete by:  As directed    Use stockings (TED hose) for 3 weeks on both leg(s).  You may remove them at night for sleeping.   Weight bearing as tolerated    Complete by:  As directed    Laterality:  left   Extremity:  Lower       Medication List    STOP taking these medications   clopidogrel 75 MG tablet Commonly known as:  PLAVIX   RA CALCIUM 600/VITAMIN D-3 600-400 MG-UNIT Tabs Generic drug:  Calcium Carb-Cholecalciferol     TAKE these medications   amLODipine 5 MG tablet Commonly known as:  NORVASC Take 5 mg by mouth daily.   brimonidine-timolol 0.2-0.5 % ophthalmic solution Commonly known as:  COMBIGAN Place 1 drop into the right eye every 12 (twelve) hours.   cycloSPORINE 0.05 % ophthalmic emulsion Commonly known as:  RESTASIS Place 1 drop into both eyes 2 (two) times daily.   erlotinib 150 MG tablet Commonly known as:  TARCEVA Take 1 tablet (150 mg total) by mouth daily. Take on an empty stomach 1 hour before meals or 2 hours after. What changed:  additional instructions   escitalopram 10 MG tablet Commonly known as:  LEXAPRO Take 10 mg by mouth daily.   famotidine 40 MG tablet Commonly known as:  PEPCID Take 40 mg by mouth daily.   FOLIVANE-PLUS Caps TAKE 1 CAPSULE BY MOUTH DAILY   letrozole 2.5 MG tablet Commonly known as:  FEMARA TAKE ONE TABLET EACH DAY   lisinopril 20 MG tablet Commonly known as:  PRINIVIL,ZESTRIL Take 20 mg by mouth daily.   methocarbamol 500 MG tablet Commonly known as:  ROBAXIN Take 1 tablet (500 mg total) by mouth every 6 (six) hours as needed for muscle spasms.   ondansetron 4 MG tablet Commonly known as:  ZOFRAN Take 1 tablet (4 mg total) by mouth every 6 (six) hours as needed for nausea.   oxyCODONE 5 MG immediate release tablet Commonly known as:  Oxy IR/ROXICODONE Take 1-2 tablets (5-10 mg total) by mouth every 3 (three) hours as needed for moderate pain or severe pain.   rivaroxaban 10 MG Tabs  tablet Commonly known as:  XARELTO Take 1 tablet (10 mg total) by mouth daily with breakfast. Take Xarelto for two and a half more weeks,  then discontinue Xarelto. Once the patient has completed the Xarelto, they may resume the Plavix 75 mg daily at home.   zolpidem 5 MG tablet Commonly known as:  AMBIEN Take 5 mg by mouth at bedtime as needed for sleep. Reported on 04/25/2016      Follow-up Information    [name], MD. Schedule an appointment as soon as possible for a visit on 08/22/2016.   Specialty:  Orthopedic Surgery Why:  Call for appointment for Thursday 08/22/2016 with Dr. Wynelle Link. Contact information: [address] [place] [address] [phone]           Signed: Arlee Muslim, PA-C Orthopaedic Surgery 08/08/2016, 7:44 AM

## 2016-08-08 NOTE — Care Management Note (Signed)
Case Management Note  Patient Details  Name: Katelynne Revak MRN: [id number] Date of Birth: 06/06/1941  Subjective/Objective:                  LEFT TOTAL HIP ARTHROPLASTY ANTERIOR APPROACH (Left) Action/Plan: Discharge planning Expected Discharge Date:                  Expected Discharge Plan:  [place]  In-House Referral:     Discharge planning Services  CM Consult  Post Acute Care Choice:  Home Health Choice offered to:  Patient  DME Arranged:  N/A DME Agency:  NA  HH Arranged:  PT HH Agency:  [facility]  Status of Service:  Completed, signed off  If discussed at [employer] of Stay Meetings, dates discussed:    Additional Comments: CM received call from local [facility] ([place] stating they do not cover pt's area but had received a faxed referral from Ortho office requesting HHPT.  This CM explained I did not make the referral.  This CM called Leonides Grills, CM of Ortho office who states she was not sure who faxed the orders to local [facility] office but has already arranged for HHPT to be rendered by "Layne Benton, PT" at home health agency in the demographic region of pt and no other action is necessary.  Pt has all DME at home and no other CM needs were communicated. Dellie Catholic, RN 08/08/2016, 2:13 PM

## 2016-08-16 ENCOUNTER — Telehealth: Payer: Self-pay | Admitting: *Deleted

## 2016-08-16 NOTE — Telephone Encounter (Signed)
Received vm call from pt stating that she called 4 days ago regarding changing appts & hasn't heard from anyone.  She reports that she is supposed to have a CT done but has had hip replacement & her ortho doesn't want her to have it until week of 20 th/November or after.  She will need to move lab & MD to after CT.  Message sent to schedulers.

## 2016-08-26 ENCOUNTER — Other Ambulatory Visit: Payer: Self-pay

## 2016-09-09 ENCOUNTER — Ambulatory Visit: Payer: Self-pay | Admitting: Internal Medicine

## 2016-09-10 ENCOUNTER — Ambulatory Visit (HOSPITAL_BASED_OUTPATIENT_CLINIC_OR_DEPARTMENT_OTHER): Payer: Medicare Other | Admitting: Internal Medicine

## 2016-09-10 ENCOUNTER — Other Ambulatory Visit (HOSPITAL_BASED_OUTPATIENT_CLINIC_OR_DEPARTMENT_OTHER): Payer: Medicare Other

## 2016-09-10 ENCOUNTER — Encounter (HOSPITAL_COMMUNITY): Payer: Self-pay

## 2016-09-10 ENCOUNTER — Encounter: Payer: Self-pay | Admitting: Internal Medicine

## 2016-09-10 ENCOUNTER — Ambulatory Visit (HOSPITAL_COMMUNITY)
Admission: RE | Admit: 2016-09-10 | Discharge: 2016-09-10 | Disposition: A | Discharge status: Home / Self Care | Payer: Medicare Other | Source: Ambulatory Visit | Attending: Internal Medicine | Admitting: Internal Medicine

## 2016-09-10 VITALS — BP 169/67 | HR 73 | Temp 97.5°F | Resp 17 | Ht 67.0 in | Wt 133.9 lb

## 2016-09-10 DIAGNOSIS — K449 Diaphragmatic hernia without obstruction or gangrene: Secondary | ICD-10-CM | POA: Insufficient documentation

## 2016-09-10 DIAGNOSIS — C182 Malignant neoplasm of ascending colon: Secondary | ICD-10-CM

## 2016-09-10 DIAGNOSIS — C3490 Malignant neoplasm of unspecified part of unspecified bronchus or lung: Secondary | ICD-10-CM | POA: Diagnosis present

## 2016-09-10 DIAGNOSIS — C50919 Malignant neoplasm of unspecified site of unspecified female breast: Secondary | ICD-10-CM | POA: Diagnosis not present

## 2016-09-10 DIAGNOSIS — M810 Age-related osteoporosis without current pathological fracture: Secondary | ICD-10-CM

## 2016-09-10 DIAGNOSIS — R590 Localized enlarged lymph nodes: Secondary | ICD-10-CM | POA: Insufficient documentation

## 2016-09-10 DIAGNOSIS — R197 Diarrhea, unspecified: Secondary | ICD-10-CM | POA: Diagnosis not present

## 2016-09-10 DIAGNOSIS — C3412 Malignant neoplasm of upper lobe, left bronchus or lung: Secondary | ICD-10-CM

## 2016-09-10 DIAGNOSIS — I700 Atherosclerosis of aorta: Secondary | ICD-10-CM | POA: Diagnosis not present

## 2016-09-10 DIAGNOSIS — R970 Elevated carcinoembryonic antigen [CEA]: Secondary | ICD-10-CM | POA: Diagnosis not present

## 2016-09-10 DIAGNOSIS — Z853 Personal history of malignant neoplasm of breast: Secondary | ICD-10-CM

## 2016-09-10 DIAGNOSIS — I10 Essential (primary) hypertension: Secondary | ICD-10-CM | POA: Diagnosis not present

## 2016-09-10 DIAGNOSIS — I2510 Atherosclerotic heart disease of native coronary artery without angina pectoris: Secondary | ICD-10-CM | POA: Diagnosis not present

## 2016-09-10 DIAGNOSIS — K8689 Other specified diseases of pancreas: Secondary | ICD-10-CM | POA: Insufficient documentation

## 2016-09-10 DIAGNOSIS — D259 Leiomyoma of uterus, unspecified: Secondary | ICD-10-CM | POA: Insufficient documentation

## 2016-09-10 DIAGNOSIS — D509 Iron deficiency anemia, unspecified: Secondary | ICD-10-CM

## 2016-09-10 LAB — COMPREHENSIVE METABOLIC PANEL
ALK PHOS: 133 U/L (ref 40–150)
ALT: 13 U/L (ref 0–55)
ANION GAP: 7 meq/L (ref 3–11)
AST: 18 U/L (ref 5–34)
Albumin: 3.3 g/dL — ABNORMAL LOW (ref 3.5–5.0)
BUN: 16.8 mg/dL (ref 7.0–26.0)
CALCIUM: 10 mg/dL (ref 8.4–10.4)
CHLORIDE: 105 meq/L (ref 98–109)
CO2: 26 mEq/L (ref 22–29)
Creatinine: 0.9 mg/dL (ref 0.6–1.1)
EGFR: 66 mL/min/{1.73_m2} — AB (ref >90)
Glucose: 98 mg/dl (ref 70–140)
POTASSIUM: 4.2 meq/L (ref 3.5–5.1)
Sodium: 138 mEq/L (ref 136–145)
Total Bilirubin: 0.79 mg/dL (ref 0.20–1.20)
Total Protein: 6.8 g/dL (ref 6.4–8.3)

## 2016-09-10 LAB — CBC WITH DIFFERENTIAL/PLATELET
BASO%: 0.5 % (ref 0.0–2.0)
BASOS ABS: 0 10*3/uL (ref 0.0–0.1)
EOS ABS: 0.1 10*3/uL (ref 0.0–0.5)
EOS%: 2.3 % (ref 0.0–7.0)
HEMATOCRIT: 34.7 % — AB (ref 34.8–46.6)
HGB: 11 g/dL — ABNORMAL LOW (ref 11.6–15.9)
LYMPH#: 0.7 10*3/uL — AB (ref 0.9–3.3)
LYMPH%: 12.2 % — ABNORMAL LOW (ref 14.0–49.7)
MCH: 28.3 pg (ref 25.1–34.0)
MCHC: 31.7 g/dL (ref 31.5–36.0)
MCV: 89.2 fL (ref 79.5–101.0)
MONO#: 0.7 10*3/uL (ref 0.1–0.9)
MONO%: 11.7 % (ref 0.0–14.0)
NEUT#: 4.4 10*3/uL (ref 1.5–6.5)
NEUT%: 73.3 % (ref 38.4–76.8)
PLATELETS: 418 10*3/uL — AB (ref 145–400)
RBC: 3.89 10*6/uL (ref 3.70–5.45)
RDW: 14.1 % (ref 11.2–14.5)
WBC: 6 10*3/uL (ref 3.9–10.3)

## 2016-09-10 LAB — CEA (IN HOUSE-CHCC): CEA (CHCC-IN HOUSE): 270.64 ng/mL — AB (ref 0.00–5.00)

## 2016-09-10 MED ORDER — IOPAMIDOL (ISOVUE-300) INJECTION 61%
100.0000 mL | Freq: Once | INTRAVENOUS | Status: AC | PRN
  Administered 2016-09-10: 100 mL via INTRAVENOUS

## 2016-09-10 MED ORDER — IOPAMIDOL (ISOVUE-300) INJECTION 61%
INTRAVENOUS | Status: AC
  Filled 2016-09-10: qty 100

## 2016-09-10 MED ORDER — SODIUM CHLORIDE 0.9 % IJ SOLN
INTRAMUSCULAR | Status: AC
  Filled 2016-09-10: qty 50

## 2016-09-10 NOTE — Progress Notes (Signed)
[place] [phone]   [phone]  OFFICE PROGRESS NOTE  Eber Hong, MD [address] [place] [address]  Principle Diagnosis:  1) Metastatic non-small cell lung cancer adenocarcinoma with positive for EGFR mutation at exon 21 diagnosed in March of 2012.  2) history of breast cancer.  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   Current therapy:  1) Tarceva at 150 mg by mouth daily status post 67 months therapy.  2) Femara 2.5 mg by mouth daily.   CHEMOTHERAPY INTENT: palliative  CURRENT # OF CHEMOTHERAPY CYCLES: 68 CURRENT ANTIEMETICS: Compazine when necessary  CURRENT SMOKING STATUS: Never smoker  ORAL CHEMOTHERAPY AND CONSENT: Tarceva and Femara.  CURRENT BISPHOSPHONATES USE: None  PAIN MANAGEMENT: None  NARCOTICS INDUCED CONSTIPATION: None  LIVING WILL AND CODE STATUS: Full code initially but no prolonged resuscitation.   INTERVAL HISTORY: Kristin [name] 75 y.o. female returns to the clinic today for two-month follow up visit accompanied by her daughter [name]. The patient is feeling fine today with no specific complaints except for several episodes of diarrhea started recently. She does not take Imodium as prescribed. She is currently on treatment with Tarceva for metastatic lung cancer as well as for Femara for history of breast cancer. The patient also recently diagnosed with colon cancer and currently on observation. Her tumor marker has been rising recently. She had repeat CT scan of the chest, abdomen and pelvis performed earlier today and she is here for evaluation and recommendation regarding her condition. The patient denied having any chest pain, shortness of breath, cough or hemoptysis. She denied having any rectal bleeding or black tarry stool. She denied having any significant nausea or vomiting, no fever or chills. She has no weight loss or night sweats.   MEDICAL  HISTORY: Past Medical History:  Diagnosis Date  . Anemia   . Anxiety   . Arthritis    arthritis- left hip  . Blood transfusion without reported diagnosis    transfusion- 3-4 yrs ago -found to be anemic on routine lab check  . Breast cancer ([place]) 2002   bilateral- bilateral mastectomies done.  . 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 CVA (cerebral infarction) 04/30/2013  . Family history of brain cancer   . Family history of breast cancer   . Family history of colon cancer 10/06/2009  . Family history of colon cancer   . Family history of prostate cancer   . GERD (gastroesophageal reflux disease)   . Glaucoma   . History of bilateral mastectomy 05/20/2010  . Hypertension   . Lung cancer ([place]) dx'd 12/2010   last Ct scan "no lung cancer" showing 11'16 CT Chest Epic.  . Malignant neoplasm of ascending colon ([place]) 01/29/2016  . Stroke [facility])    2 yrs ago-no residual  . Tubular adenoma of colon 07/2011   colon polyps ans reoccurence with malignancy found    ALLERGIES:  is allergic to doxycycline and sulfa antibiotics.  MEDICATIONS:  Current Outpatient Prescriptions  Medication Sig Dispense Refill  . amLODipine (NORVASC) 5 MG tablet Take 5 mg by mouth daily.      . brimonidine-timolol (COMBIGAN) 0.2-0.5 % ophthalmic solution Place 1 drop into the right eye every 12 (twelve) hours.    . cycloSPORINE (RESTASIS) 0.05 % ophthalmic emulsion Place 1 drop into both eyes 2 (two) times daily.    Marland Kitchen erlotinib (TARCEVA) 150 MG tablet Take 1 tablet (150 mg total) by mouth daily. Take  on an empty stomach 1 hour before meals or 2 hours after. (Patient taking differently: Take 150 mg by mouth daily. Take on an empty stomach 1 hour before meals or 2 hours after. States take at bedtime) 30 tablet 3  . escitalopram (LEXAPRO) 10 MG tablet Take 10 mg by mouth daily.     . famotidine (PEPCID) 40 MG tablet Take 40 mg by mouth daily.    Marland Kitchen FeFum-FePoly-FA-B Cmp-C-Biot (FOLIVANE-PLUS) CAPS TAKE 1 CAPSULE BY MOUTH  DAILY 90 capsule 1  . letrozole (FEMARA) 2.5 MG tablet TAKE ONE TABLET EACH DAY 30 tablet 2  . lisinopril (PRINIVIL,ZESTRIL) 20 MG tablet Take 20 mg by mouth daily.     . methocarbamol (ROBAXIN) 500 MG tablet Take 1 tablet (500 mg total) by mouth every 6 (six) hours as needed for muscle spasms. 80 tablet 0  . ondansetron (ZOFRAN) 4 MG tablet Take 1 tablet (4 mg total) by mouth every 6 (six) hours as needed for nausea. 40 tablet 0  . oxyCODONE (OXY IR/ROXICODONE) 5 MG immediate release tablet Take 1-2 tablets (5-10 mg total) by mouth every 3 (three) hours as needed for moderate pain or severe pain. 80 tablet 0  . rivaroxaban (XARELTO) 10 MG TABS tablet Take 1 tablet (10 mg total) by mouth daily with breakfast. Take Xarelto for two and a half more weeks, then discontinue Xarelto. Once the patient has completed the Xarelto, they may resume the Plavix 75 mg daily at home. 20 tablet 0  . zolpidem (AMBIEN) 5 MG tablet Take 5 mg by mouth at bedtime as needed for sleep. Reported on 04/25/2016     No current facility-administered medications for this visit.    Facility-Administered Medications Ordered in Other Visits  Medication Dose Route Frequency Provider Last Rate Last Dose  . iopamidol (ISOVUE-300) 61 % injection           . sodium chloride 0.9 % injection             SURGICAL HISTORY:  Past Surgical History:  Procedure Laterality Date  . APPENDECTOMY    . cataracts Bilateral   . COLON SURGERY  02/2016  . COLONOSCOPY    . LUNG SURGERY     Resection -" not done. Pt . tx with Tarceva  . MASTECTOMY Bilateral   . RECONSTRUCTION BREAST W/ TRAM FLAP Bilateral    bilaterally  . TEE WITHOUT CARDIOVERSION N/A 06/04/2013   Procedure: TRANSESOPHAGEAL ECHOCARDIOGRAM (TEE);  Surgeon: Jolaine Artist, MD;  [facility] ENDOSCOPY;  Service: Cardiovascular;  Laterality: N/A;  . TONSILLECTOMY    . TOTAL HIP ARTHROPLASTY Left 08/07/2016   Procedure: LEFT TOTAL HIP ARTHROPLASTY ANTERIOR APPROACH;   Surgeon: Gaynelle Arabian, MD;  Location: [facility] ORS;  Service: Orthopedics;  Laterality: Left;    REVIEW OF SYSTEMS:  Constitutional: positive for fatigue Eyes: negative Ears, nose, mouth, throat, and face: negative Respiratory: negative Cardiovascular: negative Gastrointestinal: positive for diarrhea Genitourinary:negative Integument/breast: negative Hematologic/lymphatic: negative Musculoskeletal:positive for arthralgias Neurological: negative Behavioral/Psych: negative Endocrine: negative Allergic/Immunologic: negative   PHYSICAL EXAMINATION: General appearance: alert, cooperative and no distress Head: Normocephalic, without obvious abnormality, atraumatic Neck: no adenopathy, no JVD and thyroid not enlarged, symmetric, no tenderness/mass/nodules Lymph nodes: Cervical, supraclavicular, and axillary nodes normal. Resp: clear to auscultation bilaterally and normal percussion bilaterally Back: symmetric, no curvature. ROM normal. No CVA tenderness. Cardio: regular rate and rhythm, S1, S2 normal, no murmur, click, rub or gallop GI: soft, non-tender; bowel sounds normal; no masses,  no organomegaly Extremities: extremities normal,  atraumatic, no cyanosis or edema and Grade 2 skin rash on the lower extremities Neurologic: Alert and oriented X 3, normal strength and tone. Normal symmetric reflexes. Normal coordination and gait  ECOG PERFORMANCE STATUS: 1 - Symptomatic but completely ambulatory  Blood pressure (!) 169/67, pulse 73, temperature 97.5 F (36.4 C), temperature source Oral, resp. rate 17, height _0  (1.702 m), weight 133 lb 14.4 oz (60.7 kg), SpO2 100 %.  LABORATORY DATA: Lab Results  Component Value Date   WBC 6.0 09/10/2016   HGB 11.0 (L) 09/10/2016   HCT 34.7 (L) 09/10/2016   MCV 89.2 09/10/2016   PLT 418 (H) 09/10/2016      Chemistry      Component Value Date/Time   NA 138 09/10/2016 1359   K 4.2 09/10/2016 1359   CL 109 08/08/2016 0405   CL 107 04/05/2013  0754   CO2 26 09/10/2016 1359   BUN 16.8 09/10/2016 1359   CREATININE 0.9 09/10/2016 1359      Component Value Date/Time   CALCIUM 10.0 09/10/2016 1359   ALKPHOS 133 09/10/2016 1359   AST 18 09/10/2016 1359   ALT 13 09/10/2016 1359   BILITOT 0.79 09/10/2016 1359        RADIOGRAPHIC STUDIES: No results found.  ASSESSMENT AND PLAN: this is a very pleasant 75 years old white female with:  1)  Metastatic non-small cell lung cancer, adenocarcinoma with positive EGFR mutation: Currently on treatment with Tarceva status post 67 months of treatment. She has been off treatment during her surgery but the patient resumed treatment with Tarceva the last few weeks and treating it well The patient is feeling fine with no specific complaints and tolerating her treatment well. I recommended for the patient to continue her current treatment with Tarceva with the same dose.   2) Stage II colon adenocarcinoma status post proximal colectomy with lymph node dissection. I recommended for the patient to continue on observation for now. CEA continues to rise. CT scan of the chest, abdomen and pelvis were performed earlier today for restaging of her disease but the final report is still pending. I'm definitely concerned about the rise in her CEA but I will wait for the scan results before making recommendation regarding treatment of her condition. I will call the patient with the scan results and further recommendation.  3) For the history of the breast cancer, I recommended for the patient to resume her treatment with Femara 2.5 mg by mouth daily.  4) For the osteoporosis, the patient will continue on Prolia every 6 months.  5) iron deficiency anemia: I recommended for the patient to continue her treatment with Integra +1 capsule by mouth daily.  6) Diarrhea: This could be secondary to her treatment with Tarceva. I recommended for the patient to take Imodium on as-needed basis.  7) Hypertension: She is  very anxious today about the scan results. I recommended for her to check her blood pressure at home and to reconsult with her primary care physician if stayed elevated.  I will arrange a follow-up visit based on the scan results today. The patient voices understanding of current disease status and treatment options and is in agreement with the current care plan.  All questions were answered. The patient knows to call the clinic with any problems, questions or concerns. We can certainly see the patient much sooner if necessary.  Disclaimer: This note was dictated with voice recognition software. Similar sounding words can inadvertently be transcribed and may not  be corrected upon review.

## 2016-09-11 ENCOUNTER — Other Ambulatory Visit: Payer: Self-pay | Admitting: Internal Medicine

## 2016-09-11 DIAGNOSIS — C3412 Malignant neoplasm of upper lobe, left bronchus or lung: Secondary | ICD-10-CM

## 2016-09-13 ENCOUNTER — Telehealth: Payer: Self-pay | Admitting: Internal Medicine

## 2016-09-13 NOTE — Telephone Encounter (Signed)
sw pt to confirm 12/1 appt with MM at 930 am per LOS. Pt still waiting for PET scan appt date/time

## 2016-09-17 ENCOUNTER — Telehealth: Payer: Self-pay | Admitting: *Deleted

## 2016-09-17 NOTE — Telephone Encounter (Signed)
Verbal order received and read back from Dr. Julien Nordmann for schedule to remain the same.  Results should be read by appointment time.  Order given to Mrs. Starkman at this time.

## 2016-09-17 NOTE — Telephone Encounter (Addendum)
"  I've been scheduled for PET scan same day I'm scheduled to see Dr. Julien Nordmann.  I'll arrive at 6:45 am for 7:00 scan and will see Dr. Julien Nordmann at 9:30 am.  Is t his enough time for him to receive results or do I need to reschedule the F/U visit.  Call me at home today before I leave at 1:00 for an appointment with Dr. Wynelle Link, [phone]."

## 2016-09-20 ENCOUNTER — Encounter: Payer: Self-pay | Admitting: Internal Medicine

## 2016-09-20 ENCOUNTER — Ambulatory Visit (HOSPITAL_BASED_OUTPATIENT_CLINIC_OR_DEPARTMENT_OTHER): Payer: Medicare Other | Admitting: Internal Medicine

## 2016-09-20 ENCOUNTER — Encounter: Payer: Self-pay | Admitting: Pharmacist

## 2016-09-20 ENCOUNTER — Encounter (HOSPITAL_COMMUNITY)
Admission: RE | Admit: 2016-09-20 | Discharge: 2016-09-20 | Disposition: A | Discharge status: Home / Self Care | Payer: Medicare Other | Source: Ambulatory Visit | Attending: Internal Medicine | Admitting: Internal Medicine

## 2016-09-20 VITALS — BP 147/71 | HR 81 | Temp 97.8°F | Resp 17 | Ht 67.0 in | Wt 135.0 lb

## 2016-09-20 DIAGNOSIS — Z853 Personal history of malignant neoplasm of breast: Secondary | ICD-10-CM

## 2016-09-20 DIAGNOSIS — F3289 Other specified depressive episodes: Secondary | ICD-10-CM | POA: Diagnosis not present

## 2016-09-20 DIAGNOSIS — C3412 Malignant neoplasm of upper lobe, left bronchus or lung: Secondary | ICD-10-CM | POA: Insufficient documentation

## 2016-09-20 DIAGNOSIS — C182 Malignant neoplasm of ascending colon: Secondary | ICD-10-CM | POA: Diagnosis not present

## 2016-09-20 DIAGNOSIS — I10 Essential (primary) hypertension: Secondary | ICD-10-CM | POA: Diagnosis not present

## 2016-09-20 DIAGNOSIS — D638 Anemia in other chronic diseases classified elsewhere: Secondary | ICD-10-CM | POA: Diagnosis not present

## 2016-09-20 LAB — GLUCOSE, CAPILLARY: GLUCOSE-CAPILLARY: 102 mg/dL — AB (ref 65–99)

## 2016-09-20 MED ORDER — FLUDEOXYGLUCOSE F - 18 (FDG) INJECTION
8.1800 | Freq: Once | INTRAVENOUS | Status: AC | PRN
  Administered 2016-09-20: 8.18 via INTRAVENOUS

## 2016-09-20 NOTE — Progress Notes (Signed)
Kristin [name] [phone]   [phone]  OFFICE PROGRESS NOTE  Eber Hong, MD [address] [place] [address]   Principle Diagnosis:  1)  metastatic non-small cell lung cancer, adenocarcinoma diagnosed in March 2012 with positive EGFR mutation in exon 21.   2) Adenocarcinoma of the ascending colon (T2, N0, M0) with MSI-High diagnosed in March 2017 3) history of breast adenocarcinoma.  Prior Therapy: Status post right colectomy with lymph node dissection.   Current therapy:  1) Tarceva 150 mg by mouth daily. Status post 67 months.   2) Observation for the adenocarcinoma of the colon. 3) Femara 2.5 mg by mouth daily for the history of breast cancer.    INTERVAL HISTORY: Kristin [name] 75 y.o. female returns to the clinic today for follow-up visit accompanied by her daughter Kristin [name]. This is a very pleasant 75 years old white female with multiple medical condition and diagnosis of 3 cancers, including history of breast cancer many years ago followed by metastatic non-small cell lung cancer in March 2012 and currently on treatment with Tarceva and few months ago diagnosed with stage II adenocarcinoma of the ascending colon. The patient has been tolerating her treatment with Tarceva and Femara fairly well. She has intermittent diarrhea and she is taking Imodium as needed. She has no nausea or vomiting. She has no fever or chills. She denied having any significant chest pain but continues to have shortness breath with exertion. No significant cough or hemoptysis. She was noted over the last few weeks to have rising CEA of unknown etiology. Previous CT scan of the chest, abdomen and pelvis showed concerning mediastinal lymphadenopathy. I ordered a PET scan for further evaluation of her condition and the patient is here today for discussion of her PET scan results and further recommendation regarding her condition.  MEDICAL HISTORY: Past Medical  History:  Diagnosis Date  . Anemia   . Anxiety   . Arthritis    arthritis- left hip  . Blood transfusion without reported diagnosis    transfusion- 3-4 yrs ago -found to be anemic on routine lab check  . Breast cancer ([place]) 2002   bilateral- bilateral mastectomies done.  . 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 CVA (cerebral infarction) 04/30/2013  . Family history of brain cancer   . Family history of breast cancer   . Family history of colon cancer 10/06/2009  . Family history of colon cancer   . Family history of prostate cancer   . GERD (gastroesophageal reflux disease)   . Glaucoma   . History of bilateral mastectomy 05/20/2010  . Hypertension   . Lung cancer ([place]) dx'd 12/2010   last Ct scan "no lung cancer" showing 11'16 CT Chest Epic.  . Malignant neoplasm of ascending colon ([place]) 01/29/2016  . Stroke [facility])    2 yrs ago-no residual  . Tubular adenoma of colon 07/2011   colon polyps ans reoccurence with malignancy found    ALLERGIES:  is allergic to doxycycline and sulfa antibiotics.  MEDICATIONS:  Current Outpatient Prescriptions  Medication Sig Dispense Refill  . amLODipine (NORVASC) 5 MG tablet Take 5 mg by mouth daily.      . brimonidine-timolol (COMBIGAN) 0.2-0.5 % ophthalmic solution Place 1 drop into the right eye every 12 (twelve) hours.    . clopidogrel (PLAVIX) 75 MG tablet Take 75 mg by mouth daily.    . cycloSPORINE (RESTASIS) 0.05 % ophthalmic emulsion Place 1 drop into both eyes 2 (two) times daily.    Marland Kitchen  erlotinib (TARCEVA) 150 MG tablet Take 1 tablet (150 mg total) by mouth daily. Take on an empty stomach 1 hour before meals or 2 hours after. (Patient taking differently: Take 150 mg by mouth daily. Take on an empty stomach 1 hour before meals or 2 hours after. States take at bedtime) 30 tablet 3  . escitalopram (LEXAPRO) 10 MG tablet Take 10 mg by mouth daily.     . famotidine (PEPCID) 40 MG tablet Take 40 mg by mouth daily.    Marland Kitchen FeFum-FePoly-FA-B Cmp-C-Biot  (FOLIVANE-PLUS) CAPS TAKE 1 CAPSULE BY MOUTH DAILY 90 capsule 1  . letrozole (FEMARA) 2.5 MG tablet TAKE ONE TABLET EACH DAY 30 tablet 2  . lisinopril (PRINIVIL,ZESTRIL) 20 MG tablet Take 20 mg by mouth daily.     Marland Kitchen zolpidem (AMBIEN) 5 MG tablet Take 5 mg by mouth at bedtime as needed for sleep. Reported on 04/25/2016     No current facility-administered medications for this visit.     SURGICAL HISTORY:  Past Surgical History:  Procedure Laterality Date  . APPENDECTOMY    . cataracts Bilateral   . COLON SURGERY  02/2016  . COLONOSCOPY    . LUNG SURGERY     Resection -" not done. Pt . tx with Tarceva  . MASTECTOMY Bilateral   . RECONSTRUCTION BREAST W/ TRAM FLAP Bilateral    bilaterally  . TEE WITHOUT CARDIOVERSION N/A 06/04/2013   Procedure: TRANSESOPHAGEAL ECHOCARDIOGRAM (TEE);  Surgeon: Jolaine Artist, MD;  Location: [facility] ENDOSCOPY;  Service: Cardiovascular;  Laterality: N/A;  . TONSILLECTOMY    . TOTAL HIP ARTHROPLASTY Left 08/07/2016   Procedure: LEFT TOTAL HIP ARTHROPLASTY ANTERIOR APPROACH;  Surgeon: Gaynelle Arabian, MD;  Location: [facility] ORS;  Service: Orthopedics;  Laterality: Left;    REVIEW OF SYSTEMS:  Constitutional: positive for fatigue Eyes: negative Ears, nose, mouth, throat, and face: negative Respiratory: positive for dyspnea on exertion Cardiovascular: negative Gastrointestinal: positive for diarrhea Genitourinary:negative Integument/breast: negative Hematologic/lymphatic: negative Musculoskeletal:negative Neurological: negative Behavioral/Psych: negative Endocrine: negative Allergic/Immunologic: negative   PHYSICAL EXAMINATION: General appearance: alert, cooperative, fatigued and no distress Head: Normocephalic, without obvious abnormality, atraumatic Neck: no adenopathy, no JVD, supple, symmetrical, trachea midline and thyroid not enlarged, symmetric, no tenderness/mass/nodules Lymph nodes: Cervical, supraclavicular, and axillary nodes normal. Resp: clear to  auscultation bilaterally Back: symmetric, no curvature. ROM normal. No CVA tenderness. Cardio: regular rate and rhythm, S1, S2 normal, no murmur, click, rub or gallop GI: soft, non-tender; bowel sounds normal; no masses,  no organomegaly Extremities: extremities normal, atraumatic, no cyanosis or edema Neurologic: Alert and oriented X 3, normal strength and tone. Normal symmetric reflexes. Normal coordination and gait  ECOG PERFORMANCE STATUS: 1 - Symptomatic but completely ambulatory  Blood pressure (!) 147/71, pulse 81, temperature 97.8 F (36.6 C), temperature source Oral, resp. rate 17, height 5' 7"  (1.702 m), weight 135 lb (61.2 kg), SpO2 100 %.  LABORATORY DATA: Lab Results  Component Value Date   WBC 6.0 09/10/2016   HGB 11.0 (L) 09/10/2016   HCT 34.7 (L) 09/10/2016   MCV 89.2 09/10/2016   PLT 418 (H) 09/10/2016      Chemistry      Component Value Date/Time   NA 138 09/10/2016 1359   K 4.2 09/10/2016 1359   CL 109 08/08/2016 0405   CL 107 04/05/2013 0754   CO2 26 09/10/2016 1359   BUN 16.8 09/10/2016 1359   CREATININE 0.9 09/10/2016 1359      Component Value Date/Time   CALCIUM 10.0 09/10/2016  2482   NOIBBCW 888 09/10/2016 1359   AST 18 09/10/2016 1359   ALT 13 09/10/2016 1359   BILITOT 0.79 09/10/2016 1359       RADIOGRAPHIC STUDIES: Ct Chest W Contrast  Result Date: 09/11/2016 CLINICAL DATA:  Non-small-cell lung cancer and breast cancer history. Recently diagnosed with colon cancer with surgery in May. On chemotherapy. Right upper lobe lung primary. Ascending colonic primary. EXAM: CT CHEST, ABDOMEN, AND PELVIS WITH CONTRAST TECHNIQUE: Multidetector CT imaging of the chest, abdomen and pelvis was performed following the standard protocol during bolus administration of intravenous contrast. CONTRAST:  185m ISOVUE-300 IOPAMIDOL (ISOVUE-300) INJECTION 61% COMPARISON:  01/15/2016. FINDINGS: CT CHEST FINDINGS Cardiovascular: Aortic and branch vessel  atherosclerosis. Tortuous thoracic aorta. Normal heart size, without pericardial effusion. Multivessel coronary artery atherosclerosis. No central pulmonary embolism, on this non-dedicated study. Mediastinum/Nodes: Bilateral hypo attenuating thyroid nodules are nonspecific. No supraclavicular adenopathy. Mastectomy and breast reconstruction. High right mediastinal node measures 9 mm on image 13/series 2 and is new since the prior exam. A precarinal node measures 9 mm on image 21/series 2 versus 6 mm on the prior Left mediastinal node measures 13 mm on image 28/series 2 versus 5 mm on the prior. No hilar adenopathy. Moderate hiatal hernia. Lungs/Pleura: No pleural fluid. Right middle lobe volume loss. Minimal subpleural right lower lobe nodularity is unchanged, including on image 73/series 6. Status post left upper lobe wedge resection. Musculoskeletal: No acute osseous abnormality. CT ABDOMEN PELVIS FINDINGS Hepatobiliary: Right hepatic lobe cyst. Normal gallbladder, without biliary ductal dilatation. Pancreas: Vague hypo attenuating lesion in the pancreatic head measures 6 mm on image 65/series 2 and is similar to on the prior exam. No main duct dilatation or acute pancreatitis. Spleen: Normal in size, without focal abnormality. Adrenals/Urinary Tract: Normal right adrenal gland. Left adrenal thickening. Too small to characterize interpolar left renal lesion. Normal right kidney, without hydronephrosis. Degraded evaluation of the pelvis, secondary to beam hardening artifact from left hip arthroplasty. Grossly normal urinary bladder. Stomach/Bowel: Normal remainder of the stomach. Scattered colonic diverticula. Minimal right hemicolectomy. Normal small bowel. Vascular/Lymphatic: Aortic and branch vessel atherosclerosis. No abdominopelvic adenopathy. Reproductive: Fibroid uterus.  No adnexal mass. Other: No significant free fluid. No evidence of omental or peritoneal disease. Musculoskeletal: Left hip arthroplasty.  Mild osteopenia. Right L2-3 area of probable nerve root ectasia including image 63/series 2 is present back to 01/03/2012. Lower lumbar and lumbosacral degenerative disc disease. IMPRESSION: 1. Developing thoracic adenopathy, suspicious for metastatic disease from one of the patient's primaries. 2. Interval right hemicolectomy, without abdominal pelvic metastatic disease. 3. Moderate hiatal hernia. 4.  Coronary artery atherosclerosis. Aortic atherosclerosis. 5. 6 mm pancreatic head low-density lesion is similar to on the prior exam and can be re-evaluated at follow-up. 6. Fibroid uterus. 7. Left hip arthroplasty with resultant beam hardening artifact. Electronically Signed   By: KAbigail MiyamotoM.D.   On: 09/11/2016 10:19   Ct Abdomen Pelvis W Contrast  Result Date: 09/11/2016 CLINICAL DATA:  Non-small-cell lung cancer and breast cancer history. Recently diagnosed with colon cancer with surgery in May. On chemotherapy. Right upper lobe lung primary. Ascending colonic primary. EXAM: CT CHEST, ABDOMEN, AND PELVIS WITH CONTRAST TECHNIQUE: Multidetector CT imaging of the chest, abdomen and pelvis was performed following the standard protocol during bolus administration of intravenous contrast. CONTRAST:  106mISOVUE-300 IOPAMIDOL (ISOVUE-300) INJECTION 61% COMPARISON:  01/15/2016. FINDINGS: CT CHEST FINDINGS Cardiovascular: Aortic and branch vessel atherosclerosis. Tortuous thoracic aorta. Normal heart size, without pericardial effusion. Multivessel coronary artery atherosclerosis.  No central pulmonary embolism, on this non-dedicated study. Mediastinum/Nodes: Bilateral hypo attenuating thyroid nodules are nonspecific. No supraclavicular adenopathy. Mastectomy and breast reconstruction. High right mediastinal node measures 9 mm on image 13/series 2 and is new since the prior exam. A precarinal node measures 9 mm on image 21/series 2 versus 6 mm on the prior Left mediastinal node measures 13 mm on image 28/series 2  versus 5 mm on the prior. No hilar adenopathy. Moderate hiatal hernia. Lungs/Pleura: No pleural fluid. Right middle lobe volume loss. Minimal subpleural right lower lobe nodularity is unchanged, including on image 73/series 6. Status post left upper lobe wedge resection. Musculoskeletal: No acute osseous abnormality. CT ABDOMEN PELVIS FINDINGS Hepatobiliary: Right hepatic lobe cyst. Normal gallbladder, without biliary ductal dilatation. Pancreas: Vague hypo attenuating lesion in the pancreatic head measures 6 mm on image 65/series 2 and is similar to on the prior exam. No main duct dilatation or acute pancreatitis. Spleen: Normal in size, without focal abnormality. Adrenals/Urinary Tract: Normal right adrenal gland. Left adrenal thickening. Too small to characterize interpolar left renal lesion. Normal right kidney, without hydronephrosis. Degraded evaluation of the pelvis, secondary to beam hardening artifact from left hip arthroplasty. Grossly normal urinary bladder. Stomach/Bowel: Normal remainder of the stomach. Scattered colonic diverticula. Minimal right hemicolectomy. Normal small bowel. Vascular/Lymphatic: Aortic and branch vessel atherosclerosis. No abdominopelvic adenopathy. Reproductive: Fibroid uterus.  No adnexal mass. Other: No significant free fluid. No evidence of omental or peritoneal disease. Musculoskeletal: Left hip arthroplasty. Mild osteopenia. Right L2-3 area of probable nerve root ectasia including image 63/series 2 is present back to 01/03/2012. Lower lumbar and lumbosacral degenerative disc disease. IMPRESSION: 1. Developing thoracic adenopathy, suspicious for metastatic disease from one of the patient's primaries. 2. Interval right hemicolectomy, without abdominal pelvic metastatic disease. 3. Moderate hiatal hernia. 4.  Coronary artery atherosclerosis. Aortic atherosclerosis. 5. 6 mm pancreatic head low-density lesion is similar to on the prior exam and can be re-evaluated at follow-up.  6. Fibroid uterus. 7. Left hip arthroplasty with resultant beam hardening artifact. Electronically Signed   By: Abigail Miyamoto M.D.   On: 09/11/2016 10:19   Nm Pet Image Restag (ps) Skull Base To Thigh  Result Date: 09/20/2016 CLINICAL DATA:  Subsequent treatment strategy for lung carcinoma. Additional history of colorectal carcinoma. Enlarging mediastinal lymph nodes. EXAM: NUCLEAR MEDICINE PET SKULL BASE TO THIGH TECHNIQUE: 8.1 mCi F-18 FDG was injected intravenously. Full-ring PET imaging was performed from the skull base to thigh after the radiotracer. CT data was obtained and used for attenuation correction and anatomic localization. FASTING BLOOD GLUCOSE:  Value: 103 mg/dl COMPARISON:  CT scan 09/10/2016 FINDINGS: NECK No hypermetabolic lymph nodes in the neck. CHEST New hypermetabolic high RIGHT paratracheal lymph node measures 10 mm short axis with SUV max 6.1. Precarinal lymph node measuring 10 mm SUV max equal 4.8. Subcarinal lymph node along the LEFT mainstem bronchus also is hypermetabolic. No hypermetabolic pulmonary nodules. Postsurgical change in the LEFT upper lobe without evidence local recurrence. ABDOMEN/PELVIS There is no evidence of hepatic metastasis. Adrenal glands are normal. No hypermetabolic abdominal or pelvic lymph nodes. Postsurgical change consistent RIGHT hemicolectomy. Calcified leiomyoma within the uterus. SKELETON No focal hypermetabolic activity to suggest skeletal metastasis. IMPRESSION: 1. Small hypermetabolic mediastinal lymph nodes are consistent with mediastinal nodal metastasis. 2. No hypermetabolic pulmonary nodules. 3. No evidence of carcinoma recurrence or metastasis in the abdomen or pelvis. Electronically Signed   By: Suzy Bouchard M.D.   On: 09/20/2016 11:12    ASSESSMENT AND PLAN:  This is a very pleasant 75 years old white female with a very complicated condition and multiple malignancies.  1) metastatic non-small cell lung cancer with positive EGFR mutation  in exon 21: She has been on treatment with Tarceva for the last 67 months and tolerating the treatment well except for diarrhea. I recommended for the patient to continue treatment with Imodium as needed. She has no concerning skin rash at this point. I had a lengthy discussion with the patient and her daughter today about the PET scan findings. At the time of the visit the final report of the PET scan was not available but I personally reviewed the images and discussed it with the patient and her daughter. It showed hypermetabolic mediastinal lymph nodes concerning for disease recurrence. There was no other evidence of metastasis in the abdomen or pelvis. The findings on the PET scan are concerning for recurrent lung cancer but metastasis from her colon cancer cannot be excluded at this point especially with the rapidly rising CEA. I recommended for the patient to see Dr. Roxan Hockey for consideration of mediastinoscopy and biopsy of one of the hypermetabolic lymph nodes for confirmation of the tissue diagnosis and also for molecular studies. The patient and her daughter agree to this current plan.  2) stage II colon adenocarcinoma status post right colectomy. Her tumor showed MSI High. The rising CEA is concerning for metastatic colon adenocarcinoma in the mediastinum. If the tissue biopsy from the mediastinal lymph node is consistent with colon adenocarcinoma, I would consider the patient for treatment with Ketruda (pembrolizumab) because of the MSI high feature of her tumor.  3) history of breast cancer: She is currently on active treatment with Femara. I recommended for the patient to continue with this treatment. She has no concerning adverse effect from the treatment.  4) anemia of neoplastic disease: I recommended for the patient to continue her current treatment with oral iron tablets for now. I will continue to monitor it closely and consider the patient for transfusion if needed.  5)  hypertension: This is likely secondary to anxiety. She will continue her current treatment with lisinopril and Norvasc.  6) depression: I recommended for the patient to continue her current treatment with Lexapro. The patient voices understanding of current disease status and treatment options and is in agreement with the current care plan.  All questions were answered. The patient knows to call the clinic with any problems, questions or concerns. We can certainly see the patient much sooner if necessary.  Disclaimer: This note was dictated with voice recognition software. Similar sounding words can inadvertently be transcribed and may not be corrected upon review.

## 2016-09-20 NOTE — Progress Notes (Signed)
Oral Chemotherapy Pharmacist Encounter  Received notification from [place], financial advocate, that [place] [facility]) has requested updated prescription information for patient. Noted prescription form sent 07/23/16. This form was resent to [employer] with quantity supplied and directions for use described in more detail.  Prescription request form and clarification request faxed back to [phone]  Johny Drilling, PharmD, BCPS, BCOP 09/20/2016  3:06 PM Oral Oncology Clinic [phone]

## 2016-09-20 NOTE — Progress Notes (Signed)
Work excuse note given to daughter for last visit and today.

## 2016-09-21 ENCOUNTER — Encounter: Payer: Self-pay | Admitting: Internal Medicine

## 2016-09-21 DIAGNOSIS — D638 Anemia in other chronic diseases classified elsewhere: Secondary | ICD-10-CM

## 2016-09-21 HISTORY — DX: Anemia in other chronic diseases classified elsewhere: D63.8

## 2016-09-25 ENCOUNTER — Encounter: Payer: Self-pay | Admitting: Internal Medicine

## 2016-09-25 ENCOUNTER — Telehealth: Payer: Self-pay | Admitting: Internal Medicine

## 2016-09-25 NOTE — Progress Notes (Signed)
Obtained signature from physician for [place] prescription. Faxed to [employer]. Fax received ok per confirmation sheet.

## 2016-09-25 NOTE — Telephone Encounter (Signed)
Spoke with patient re 12/22 lab/fu

## 2016-09-26 ENCOUNTER — Other Ambulatory Visit: Payer: Self-pay | Admitting: *Deleted

## 2016-09-26 ENCOUNTER — Encounter: Payer: Self-pay | Admitting: Thoracic Surgery (Cardiothoracic Vascular Surgery)

## 2016-09-26 ENCOUNTER — Institutional Professional Consult (permissible substitution) (INDEPENDENT_AMBULATORY_CARE_PROVIDER_SITE_OTHER): Payer: Medicare Other | Admitting: Thoracic Surgery (Cardiothoracic Vascular Surgery)

## 2016-09-26 VITALS — BP 173/65 | HR 65 | Temp 98.6°F | Ht 68.0 in | Wt 135.6 lb

## 2016-09-26 DIAGNOSIS — R590 Localized enlarged lymph nodes: Secondary | ICD-10-CM

## 2016-09-26 DIAGNOSIS — R599 Enlarged lymph nodes, unspecified: Secondary | ICD-10-CM

## 2016-09-26 DIAGNOSIS — R591 Generalized enlarged lymph nodes: Secondary | ICD-10-CM

## 2016-09-26 NOTE — Progress Notes (Signed)
PCP is EASON,PAUL, MD Referring Provider is Mohamed, Mohamed, MD  No chief complaint on file.   HPI: 75 yo woman with a history of breast cancer, stage IV lung cancer, and recent colon cancer who was found to have mediastinal adenopathy on a recent f/u CT.  She was diagnosed with breast cancer in 2002 and had bilateral mastectomies. In 2012 she was found to have a lung nodule. She was stage I clinically. 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 She was diagnosed with colon cancer, stage I, in May and treated with resection. She is on Plavix for a TIA several years ago.  Her CT showed a 9 mm node between the innominate artery and right subclavian vein, a 9 mm pretracheal node and a left hilar node along the left mainstem bronchus. All 3 nodes were hypermetabolic by PET.  She has been feeling well. She has been losing weight since the Spring but has gained a few pounds back over the past couple of weeks.  Zubrod Score: At the time of surgery this patient's most appropriate activity status/level should be described as: [x]    0    Normal activity, no symptoms []    1    Restricted in physical strenuous activity but ambulatory, able to do out light work []    2    Ambulatory and capable of self care, unable to do work activities, up and about >50 % of waking hours                              []    3    Only limited self care, in bed greater than 50% of waking hours []    4    Completely disabled, no self care, confined to bed or chair []    5    Moribund    Past Medical History:  Diagnosis Date  . Anemia   . Anemia of chronic disease 09/21/2016  . Anxiety   . Arthritis    arthritis- left hip  . Blood transfusion without reported diagnosis    transfusion- 3-4 yrs ago -found to be anemic on routine lab check  . Breast cancer (HCC) 2002   bilateral- bilateral mastectomies done.  . 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 CVA  (cerebral infarction) 04/30/2013  . Family history of brain cancer   . Family history of breast cancer   . Family history of colon cancer 10/06/2009  . Family history of colon cancer   . Family history of prostate cancer   . GERD (gastroesophageal reflux disease)   . Glaucoma   . History of bilateral mastectomy 05/20/2010  . Hypertension   . Lung cancer (HCC) dx'd 12/2010   last Ct scan "no lung cancer" showing 11'16 CT Chest Epic.  . Malignant neoplasm of ascending colon (HCC) 01/29/2016  . Stroke (HCC)    2 yrs ago-no residual  . Tubular adenoma of colon 07/2011   colon polyps ans reoccurence with malignancy found    Past Surgical History:  Procedure Laterality Date  . APPENDECTOMY    . cataracts Bilateral   . COLON SURGERY  02/2016  . COLONOSCOPY    . LUNG SURGERY     Resection -" not done. Pt . tx with Tarceva  . MASTECTOMY Bilateral   . RECONSTRUCTION BREAST W/ TRAM FLAP Bilateral      bilaterally  . TEE WITHOUT CARDIOVERSION N/A 06/04/2013   Procedure: TRANSESOPHAGEAL ECHOCARDIOGRAM (TEE);  Surgeon: Daniel R Bensimhon, MD;  Location: [facility];  Service: Cardiovascular;  Laterality: N/A;  . TONSILLECTOMY    . TOTAL HIP ARTHROPLASTY Left 08/07/2016   Procedure: LEFT TOTAL HIP ARTHROPLASTY ANTERIOR APPROACH;  Surgeon: Frank Aluisio, MD;  Location: [facility];  Service: Orthopedics;  Laterality: Left;    Family History  Problem Relation Age of Onset  . Lung cancer Father   . Heart disease Mother   . Clotting disorder Mother 46    coronary thrombosis  . Colon cancer Sister     dx in her 50s  . Breast cancer Sister 77  . Multiple myeloma Paternal Grandmother   . Brain cancer Paternal Uncle   . Cancer Paternal Aunt     unknown  . Colon cancer Maternal Aunt 80  . Prostate cancer Maternal Uncle     dx in his late 70s  . Other Maternal Uncle     brain tumor dx in his 60s  . Lung cancer Maternal Uncle   . Cancer Maternal Uncle     NOS  . Stomach cancer Paternal Aunt   .  Brain cancer Cousin     maternal first cousin dx in late 40s-50s  . Cancer Paternal Uncle     NOS  . Colon cancer Cousin     paternal cousin dx in his 50s  . Cancer Cousin     paternal cousin dx with NOS cancer    Social History Social History  Substance Use Topics  . Smoking status: Never Smoker  . Smokeless tobacco: Never Used  . Alcohol use 0.0 oz/week     Comment: ocassional beer    Current Outpatient Prescriptions  Medication Sig Dispense Refill  . amLODipine (NORVASC) 5 MG tablet Take 5 mg by mouth daily.      . brimonidine-timolol (COMBIGAN) 0.2-0.5 % ophthalmic solution Place 1 drop into the right eye every 12 (twelve) hours.    . clopidogrel (PLAVIX) 75 MG tablet Take 75 mg by mouth daily.    . cycloSPORINE (RESTASIS) 0.05 % ophthalmic emulsion Place 1 drop into both eyes 2 (two) times daily.    . erlotinib (TARCEVA) 150 MG tablet Take 1 tablet (150 mg total) by mouth daily. Take on an empty stomach 1 hour before meals or 2 hours after. (Patient taking differently: Take 150 mg by mouth daily. Take on an empty stomach 1 hour before meals or 2 hours after. States take at bedtime) 30 tablet 3  . escitalopram (LEXAPRO) 10 MG tablet Take 10 mg by mouth daily.     . famotidine (PEPCID) 40 MG tablet Take 40 mg by mouth daily.    . FeFum-FePoly-FA-B Cmp-C-Biot (FOLIVANE-PLUS) CAPS TAKE 1 CAPSULE BY MOUTH DAILY 90 capsule 1  . letrozole (FEMARA) 2.5 MG tablet TAKE ONE TABLET EACH DAY 30 tablet 2  . lisinopril (PRINIVIL,ZESTRIL) 20 MG tablet Take 20 mg by mouth daily.     . zolpidem (AMBIEN) 5 MG tablet Take 5 mg by mouth at bedtime as needed for sleep. Reported on 04/25/2016     No current facility-administered medications for this visit.     Allergies  Allergen Reactions  . Doxycycline Nausea Only  . Sulfa Antibiotics Rash    Review of Systems  Constitutional: Negative for activity change, appetite change, chills, fever and unexpected weight change.  HENT: Negative for  trouble swallowing and voice change.   Eyes: Negative for   visual disturbance.  Respiratory: Negative for shortness of breath and wheezing.   Cardiovascular: Negative for chest pain and leg swelling.  Gastrointestinal: Positive for diarrhea.       Hiatal hernia  Musculoskeletal: Negative for arthralgias and myalgias.  Neurological: Negative for seizures and syncope.  Hematological: Positive for adenopathy (on CT). Bruises/bleeds easily.  All other systems reviewed and are negative.   BP (!) 173/65   Pulse 65   Temp 98.6 F (37 C)   Ht 5' 8" (1.727 m)   Wt 135 lb 9.6 oz (61.5 kg)   BMI 20.62 kg/m  Physical Exam  Constitutional: She is oriented to person, place, and time. She appears well-developed and well-nourished. No distress.  HENT:  Head: Normocephalic and atraumatic.  Mouth/Throat: No oropharyngeal exudate.  Eyes: Conjunctivae and EOM are normal. No scleral icterus.  Neck: Neck supple. No thyromegaly present.  Cardiovascular: Normal rate, regular rhythm, normal heart sounds and intact distal pulses.   No murmur heard. Pulmonary/Chest: Breath sounds normal. No respiratory distress. She has no wheezes. She has no rales.  Abdominal: Soft. She exhibits no distension. There is no tenderness.  Musculoskeletal: She exhibits no edema.  Lymphadenopathy:    She has no cervical adenopathy.  Neurological: She is alert and oriented to person, place, and time. No cranial nerve deficit.  Skin: Skin is warm and dry.  Vitals reviewed.    Diagnostic Tests: CT CHEST FINDINGS  Cardiovascular: Aortic and branch vessel atherosclerosis. Tortuous thoracic aorta. Normal heart size, without pericardial effusion. Multivessel coronary artery atherosclerosis. No central pulmonary embolism, on this non-dedicated study.  Mediastinum/Nodes: Bilateral hypo attenuating thyroid nodules are nonspecific. No supraclavicular adenopathy. Mastectomy and breast reconstruction. High right mediastinal  node measures 9 mm on image 13/series 2 and is new since the prior exam.  A precarinal node measures 9 mm on image 21/series 2 versus 6 mm on the prior  Left mediastinal node measures 13 mm on image 28/series 2 versus 5 mm on the prior. No hilar adenopathy. Moderate hiatal hernia.  Lungs/Pleura: No pleural fluid. Right middle lobe volume loss. Minimal subpleural right lower lobe nodularity is unchanged, including on image 73/series 6.  Status post left upper lobe wedge resection.  Musculoskeletal: No acute osseous abnormality. NUCLEAR MEDICINE PET SKULL BASE TO THIGH  TECHNIQUE: 8.1 mCi F-18 FDG was injected intravenously. Full-ring PET imaging was performed from the skull base to thigh after the radiotracer. CT data was obtained and used for attenuation correction and anatomic localization.  FASTING BLOOD GLUCOSE:  Value: 103 mg/dl  COMPARISON:  CT scan 09/10/2016  FINDINGS: NECK  No hypermetabolic lymph nodes in the neck.  CHEST  New hypermetabolic high RIGHT paratracheal lymph node measures 10 mm short axis with SUV max 6.1. Precarinal lymph node measuring 10 mm SUV max equal 4.8. Subcarinal lymph node along the LEFT mainstem bronchus also is hypermetabolic.  No hypermetabolic pulmonary nodules. Postsurgical change in the LEFT upper lobe without evidence local recurrence.  ABDOMEN/PELVIS  There is no evidence of hepatic metastasis. Adrenal glands are normal. No hypermetabolic abdominal or pelvic lymph nodes.  Postsurgical change consistent RIGHT hemicolectomy.  Calcified leiomyoma within the uterus.  SKELETON  No focal hypermetabolic activity to suggest skeletal metastasis.  IMPRESSION: 1. Small hypermetabolic mediastinal lymph nodes are consistent with mediastinal nodal metastasis. 2. No hypermetabolic pulmonary nodules. 3. No evidence of carcinoma recurrence or metastasis in the abdomen or pelvis.   Electronically Signed    By: Stewart  Edmunds M.D.   On: 09/20/2016   11:12 I personally reviewed the CT and PET images and concur with the findings noted above.  Impression: 75 yo woman with a history of multiple malignancies who presents with new mediastinal adenopathy on CT that is hypermetabolic by PET. She has had a remarkable response to Tarceva, but the mediastinal nodes most likely are recurrent lung cancer. Breast is also possible but her primary was 15 years ago. Colon is very unlikely given the distribution of the nodes. In any event she needs biopsy to confirm the diagnosis and will need sufficient tissue to allow for metabolic testing. For those reasons I favor mediastinoscopy for diagnostic purposes. EBUS would allow access to the node along the left main stem but is unlikely to yield sufficient tissue.  I discussed the general nature of the procedure, the need for general anesthesia ,and the incision to be used with Mrs. Disch and her family. This will be done in the OR on an outpatient basis. I informed them of the indications, risks, benefits and alternatives, as well as the difficulties anticipated. They understand the risks include but are not limited to death, stroke, MI, DVT/PE, bleeding, possible need for transfusion, infection, pneumothorax, and recurrent nerve injury with vocal cord paralysis. She accepts the risks and agrees to proceed.  Hold Plavix after today  Plan:  Mediastinoscopy on Wed 10/02/16  Steven C Hendrickson, MD [facility] [phone] 

## 2016-09-30 ENCOUNTER — Encounter (HOSPITAL_COMMUNITY)
Admission: RE | Admit: 2016-09-30 | Discharge: 2016-09-30 | Disposition: A | Discharge status: Home / Self Care | Payer: Medicare Other | Source: Ambulatory Visit | Attending: Thoracic Surgery (Cardiothoracic Vascular Surgery) | Admitting: Thoracic Surgery (Cardiothoracic Vascular Surgery)

## 2016-09-30 ENCOUNTER — Encounter (HOSPITAL_COMMUNITY): Payer: Self-pay

## 2016-09-30 DIAGNOSIS — Z853 Personal history of malignant neoplasm of breast: Secondary | ICD-10-CM | POA: Insufficient documentation

## 2016-09-30 DIAGNOSIS — R591 Generalized enlarged lymph nodes: Secondary | ICD-10-CM | POA: Insufficient documentation

## 2016-09-30 DIAGNOSIS — C182 Malignant neoplasm of ascending colon: Secondary | ICD-10-CM | POA: Insufficient documentation

## 2016-09-30 DIAGNOSIS — Z8673 Personal history of transient ischemic attack (TIA), and cerebral infarction without residual deficits: Secondary | ICD-10-CM | POA: Diagnosis not present

## 2016-09-30 DIAGNOSIS — F329 Major depressive disorder, single episode, unspecified: Secondary | ICD-10-CM | POA: Diagnosis not present

## 2016-09-30 DIAGNOSIS — F3289 Other specified depressive episodes: Secondary | ICD-10-CM

## 2016-09-30 DIAGNOSIS — Z79899 Other long term (current) drug therapy: Secondary | ICD-10-CM | POA: Insufficient documentation

## 2016-09-30 DIAGNOSIS — C771 Secondary and unspecified malignant neoplasm of intrathoracic lymph nodes: Secondary | ICD-10-CM | POA: Diagnosis not present

## 2016-09-30 DIAGNOSIS — C3412 Malignant neoplasm of upper lobe, left bronchus or lung: Secondary | ICD-10-CM

## 2016-09-30 DIAGNOSIS — Z9013 Acquired absence of bilateral breasts and nipples: Secondary | ICD-10-CM | POA: Diagnosis not present

## 2016-09-30 DIAGNOSIS — Z01812 Encounter for preprocedural laboratory examination: Secondary | ICD-10-CM

## 2016-09-30 DIAGNOSIS — I10 Essential (primary) hypertension: Secondary | ICD-10-CM

## 2016-09-30 DIAGNOSIS — K219 Gastro-esophageal reflux disease without esophagitis: Secondary | ICD-10-CM | POA: Diagnosis not present

## 2016-09-30 DIAGNOSIS — D638 Anemia in other chronic diseases classified elsewhere: Secondary | ICD-10-CM | POA: Insufficient documentation

## 2016-09-30 DIAGNOSIS — Z7902 Long term (current) use of antithrombotics/antiplatelets: Secondary | ICD-10-CM | POA: Insufficient documentation

## 2016-09-30 DIAGNOSIS — Z803 Family history of malignant neoplasm of breast: Secondary | ICD-10-CM | POA: Diagnosis not present

## 2016-09-30 DIAGNOSIS — R001 Bradycardia, unspecified: Secondary | ICD-10-CM | POA: Insufficient documentation

## 2016-09-30 DIAGNOSIS — R599 Enlarged lymph nodes, unspecified: Secondary | ICD-10-CM

## 2016-09-30 DIAGNOSIS — C782 Secondary malignant neoplasm of pleura: Secondary | ICD-10-CM | POA: Diagnosis not present

## 2016-09-30 DIAGNOSIS — F419 Anxiety disorder, unspecified: Secondary | ICD-10-CM | POA: Diagnosis not present

## 2016-09-30 DIAGNOSIS — Z800 Family history of malignant neoplasm of digestive organs: Secondary | ICD-10-CM | POA: Diagnosis not present

## 2016-09-30 DIAGNOSIS — Z96642 Presence of left artificial hip joint: Secondary | ICD-10-CM | POA: Diagnosis not present

## 2016-09-30 DIAGNOSIS — R590 Localized enlarged lymph nodes: Secondary | ICD-10-CM | POA: Diagnosis present

## 2016-09-30 DIAGNOSIS — Z85038 Personal history of other malignant neoplasm of large intestine: Secondary | ICD-10-CM | POA: Diagnosis not present

## 2016-09-30 LAB — CBC
HEMATOCRIT: 33.9 % — AB (ref 36.0–46.0)
HEMOGLOBIN: 10.7 g/dL — AB (ref 12.0–15.0)
MCH: 28.1 pg (ref 26.0–34.0)
MCHC: 31.6 g/dL (ref 30.0–36.0)
MCV: 89 fL (ref 78.0–100.0)
Platelets: 458 10*3/uL — ABNORMAL HIGH (ref 150–400)
RBC: 3.81 MIL/uL — ABNORMAL LOW (ref 3.87–5.11)
RDW: 14.4 % (ref 11.5–15.5)
WBC: 4.2 10*3/uL (ref 4.0–10.5)

## 2016-09-30 LAB — COMPREHENSIVE METABOLIC PANEL
ALT: 15 U/L (ref 14–54)
ANION GAP: 5 (ref 5–15)
AST: 21 U/L (ref 15–41)
Albumin: 3.5 g/dL (ref 3.5–5.0)
Alkaline Phosphatase: 108 U/L (ref 38–126)
BILIRUBIN TOTAL: 1 mg/dL (ref 0.3–1.2)
BUN: 13 mg/dL (ref 6–20)
CALCIUM: 9.7 mg/dL (ref 8.9–10.3)
CO2: 28 mmol/L (ref 22–32)
Chloride: 105 mmol/L (ref 101–111)
Creatinine, Ser: 0.92 mg/dL (ref 0.44–1.00)
GFR, EST NON AFRICAN AMERICAN: 59 mL/min — AB (ref >60)
Glucose, Bld: 97 mg/dL (ref 65–99)
Potassium: 4.3 mmol/L (ref 3.5–5.1)
SODIUM: 138 mmol/L (ref 135–145)
TOTAL PROTEIN: 6.6 g/dL (ref 6.5–8.1)

## 2016-09-30 LAB — TYPE AND SCREEN
ABO/RH(D): B POS
Antibody Screen: NEGATIVE

## 2016-09-30 LAB — PROTIME-INR
INR: 1
PROTHROMBIN TIME: 13.2 s (ref 11.4–15.2)

## 2016-09-30 LAB — ABO/RH: ABO/RH(D): B POS

## 2016-09-30 LAB — APTT: aPTT: 28 seconds (ref 24–36)

## 2016-09-30 NOTE — Progress Notes (Addendum)
PCP is Dr. Eber Hong  LOV 07/2016  Denies any cardiac issues, no SOB, not a diabetic Oncologist is Dr. Aundra Dubin   LOV 09/13/2016 She has been on Plavix since her stroke in 2014.  She stopped taking it on 12/7 (Thursday) for this surgery.

## 2016-09-30 NOTE — Pre-Procedure Instructions (Signed)
Cassandr Cederberg  09/30/2016      [place], [address] [place] [address] Phone: [phone] Fax: [phone]  [place], [place] [address] [place] [address] Phone: [phone] Fax: [place], [place] [place] [address] Phone: [phone] Fax: [phone]    Your procedure is scheduled on Wednesday, December 13th   Report to [facility] Admitting at 6:30 AM             (posted surgery time 8:30 - 9:53 am)   Call this number [phone] between the hours of 8 - 5:30 Am, Mon thru Friday, for any questions.              And on the MORNING of surgery  [phone]   Remember:  Do not eat food or drink liquids after midnight Tuesday.   Take these medicines the morning of surgery with A SIP OF WATER : Amlodipine, Lexapro, Famotidine.              4 -5 days prior to surgery, STOP taking any vitamins, herbal supplements, anti-inflammatories.   Do not wear jewelry, make-up or nail polish.  Do not wear lotions, powders, perfumes, or deoderant.   Do not shave underarms & legs 48 hours prior to surgery.    Do not bring valuables to the hospital.  [facility] is not responsible for any belongings or valuables.  Contacts, dentures or bridgework may not be worn into surgery.  Leave your suitcase in the car.  After surgery it may be brought to your room.  Patients discharged the day of surgery will not be allowed to drive home.   Please read over the following fact sheets that you were given. Pain Booklet and Surgical Site Infection Prevention

## 2016-10-02 ENCOUNTER — Encounter: Payer: Self-pay | Admitting: Thoracic Surgery (Cardiothoracic Vascular Surgery)

## 2016-10-02 ENCOUNTER — Encounter (HOSPITAL_COMMUNITY): Payer: Self-pay | Admitting: *Deleted

## 2016-10-02 ENCOUNTER — Encounter (HOSPITAL_COMMUNITY)
Admission: RE | Disposition: A | Discharge status: Home / Self Care | Payer: Self-pay | Source: Ambulatory Visit | Attending: Thoracic Surgery (Cardiothoracic Vascular Surgery)

## 2016-10-02 ENCOUNTER — Ambulatory Visit (HOSPITAL_COMMUNITY)
Admission: RE | Admit: 2016-10-02 | Discharge: 2016-10-02 | Disposition: A | Discharge status: Home / Self Care | Payer: Medicare Other | Source: Ambulatory Visit | Attending: Thoracic Surgery (Cardiothoracic Vascular Surgery) | Admitting: Thoracic Surgery (Cardiothoracic Vascular Surgery)

## 2016-10-02 ENCOUNTER — Ambulatory Visit (HOSPITAL_COMMUNITY): Payer: Medicare Other | Admitting: Anesthesiology

## 2016-10-02 DIAGNOSIS — Z8673 Personal history of transient ischemic attack (TIA), and cerebral infarction without residual deficits: Secondary | ICD-10-CM | POA: Diagnosis not present

## 2016-10-02 DIAGNOSIS — Z85038 Personal history of other malignant neoplasm of large intestine: Secondary | ICD-10-CM | POA: Insufficient documentation

## 2016-10-02 DIAGNOSIS — F329 Major depressive disorder, single episode, unspecified: Secondary | ICD-10-CM | POA: Insufficient documentation

## 2016-10-02 DIAGNOSIS — F419 Anxiety disorder, unspecified: Secondary | ICD-10-CM | POA: Insufficient documentation

## 2016-10-02 DIAGNOSIS — Z96642 Presence of left artificial hip joint: Secondary | ICD-10-CM | POA: Insufficient documentation

## 2016-10-02 DIAGNOSIS — Z9013 Acquired absence of bilateral breasts and nipples: Secondary | ICD-10-CM | POA: Insufficient documentation

## 2016-10-02 DIAGNOSIS — Z79899 Other long term (current) drug therapy: Secondary | ICD-10-CM | POA: Insufficient documentation

## 2016-10-02 DIAGNOSIS — C782 Secondary malignant neoplasm of pleura: Secondary | ICD-10-CM | POA: Insufficient documentation

## 2016-10-02 DIAGNOSIS — R590 Localized enlarged lymph nodes: Secondary | ICD-10-CM | POA: Diagnosis not present

## 2016-10-02 DIAGNOSIS — Z7902 Long term (current) use of antithrombotics/antiplatelets: Secondary | ICD-10-CM | POA: Diagnosis not present

## 2016-10-02 DIAGNOSIS — R591 Generalized enlarged lymph nodes: Secondary | ICD-10-CM

## 2016-10-02 DIAGNOSIS — Z853 Personal history of malignant neoplasm of breast: Secondary | ICD-10-CM | POA: Insufficient documentation

## 2016-10-02 DIAGNOSIS — K219 Gastro-esophageal reflux disease without esophagitis: Secondary | ICD-10-CM | POA: Insufficient documentation

## 2016-10-02 DIAGNOSIS — Z803 Family history of malignant neoplasm of breast: Secondary | ICD-10-CM | POA: Insufficient documentation

## 2016-10-02 DIAGNOSIS — I10 Essential (primary) hypertension: Secondary | ICD-10-CM | POA: Insufficient documentation

## 2016-10-02 DIAGNOSIS — C3490 Malignant neoplasm of unspecified part of unspecified bronchus or lung: Secondary | ICD-10-CM

## 2016-10-02 DIAGNOSIS — C771 Secondary and unspecified malignant neoplasm of intrathoracic lymph nodes: Secondary | ICD-10-CM | POA: Diagnosis not present

## 2016-10-02 DIAGNOSIS — Z800 Family history of malignant neoplasm of digestive organs: Secondary | ICD-10-CM | POA: Insufficient documentation

## 2016-10-02 DIAGNOSIS — R599 Enlarged lymph nodes, unspecified: Secondary | ICD-10-CM

## 2016-10-02 HISTORY — PX: MEDIASTINOSCOPY: SHX5086

## 2016-10-02 SURGERY — MEDIASTINOSCOPY
Anesthesia: General | Site: Chest

## 2016-10-02 MED ORDER — HYDROMORPHONE HCL 1 MG/ML IJ SOLN
0.2500 mg | INTRAMUSCULAR | Status: DC | PRN
Start: 2016-10-02 — End: 2016-10-02
  Administered 2016-10-02: 0.5 mg via INTRAVENOUS

## 2016-10-02 MED ORDER — 0.9 % SODIUM CHLORIDE (POUR BTL) OPTIME
TOPICAL | Status: DC | PRN
  Administered 2016-10-02: 1000 mL

## 2016-10-02 MED ORDER — LACTATED RINGERS IV SOLN
INTRAVENOUS | Status: DC | PRN
Start: 2016-10-02 — End: 2016-10-02
  Administered 2016-10-02: 08:00:00 via INTRAVENOUS

## 2016-10-02 MED ORDER — FENTANYL CITRATE (PF) 250 MCG/5ML IJ SOLN
INTRAMUSCULAR | Status: AC
  Filled 2016-10-02: qty 5

## 2016-10-02 MED ORDER — HEMOSTATIC AGENTS (NO CHARGE) OPTIME
TOPICAL | Status: DC | PRN
  Administered 2016-10-02: 1 via TOPICAL

## 2016-10-02 MED ORDER — HYDROMORPHONE HCL 1 MG/ML IJ SOLN
INTRAMUSCULAR | Status: AC
  Filled 2016-10-02: qty 0.5

## 2016-10-02 MED ORDER — PROPOFOL 10 MG/ML IV BOLUS
INTRAVENOUS | Status: DC | PRN
  Administered 2016-10-02: 100 mg via INTRAVENOUS
  Administered 2016-10-02: 30 mg via INTRAVENOUS

## 2016-10-02 MED ORDER — OXYCODONE HCL 5 MG PO TABS: 5.0000 mg | ORAL_TABLET | Freq: Four times a day (QID) | ORAL | 0 refills | Status: DC | PRN

## 2016-10-02 MED ORDER — ONDANSETRON HCL 4 MG/2ML IJ SOLN
INTRAMUSCULAR | Status: DC | PRN
Start: 2016-10-02 — End: 2016-10-02
  Administered 2016-10-02: 4 mg via INTRAVENOUS

## 2016-10-02 MED ORDER — PHENYLEPHRINE 40 MCG/ML (10ML) SYRINGE FOR IV PUSH (FOR BLOOD PRESSURE SUPPORT)
PREFILLED_SYRINGE | INTRAVENOUS | Status: AC
  Filled 2016-10-02: qty 10

## 2016-10-02 MED ORDER — FENTANYL CITRATE (PF) 100 MCG/2ML IJ SOLN
INTRAMUSCULAR | Status: DC | PRN
  Administered 2016-10-02: 25 ug via INTRAVENOUS
  Administered 2016-10-02 (×2): 50 ug via INTRAVENOUS
  Administered 2016-10-02: 25 ug via INTRAVENOUS

## 2016-10-02 MED ORDER — SUGAMMADEX SODIUM 200 MG/2ML IV SOLN
INTRAVENOUS | Status: AC
  Filled 2016-10-02: qty 2

## 2016-10-02 MED ORDER — MIDAZOLAM HCL 2 MG/2ML IJ SOLN
INTRAMUSCULAR | Status: AC
  Filled 2016-10-02: qty 2

## 2016-10-02 MED ORDER — PHENYLEPHRINE HCL 10 MG/ML IJ SOLN
INTRAMUSCULAR | Status: DC | PRN
  Administered 2016-10-02: 80 ug via INTRAVENOUS

## 2016-10-02 MED ORDER — MIDAZOLAM HCL 5 MG/5ML IJ SOLN
INTRAMUSCULAR | Status: DC | PRN
  Administered 2016-10-02: 1 mg via INTRAVENOUS

## 2016-10-02 MED ORDER — ONDANSETRON HCL 4 MG/2ML IJ SOLN
INTRAMUSCULAR | Status: AC
  Filled 2016-10-02: qty 2

## 2016-10-02 MED ORDER — DEXTROSE 5 % IV SOLN
1.5000 g | INTRAVENOUS | Status: AC
  Administered 2016-10-02: 1.5 g via INTRAVENOUS

## 2016-10-02 MED ORDER — SUGAMMADEX SODIUM 200 MG/2ML IV SOLN
INTRAVENOUS | Status: DC | PRN
  Administered 2016-10-02: 150 mg via INTRAVENOUS

## 2016-10-02 MED ORDER — PROPOFOL 10 MG/ML IV BOLUS
INTRAVENOUS | Status: AC
  Filled 2016-10-02: qty 40

## 2016-10-02 MED ORDER — PHENYLEPHRINE HCL 10 MG/ML IJ SOLN
INTRAVENOUS | Status: DC | PRN
  Administered 2016-10-02: 50 ug/min via INTRAVENOUS

## 2016-10-02 MED ORDER — ONDANSETRON HCL 4 MG/2ML IJ SOLN: 4.0000 mg | Freq: Once | INTRAMUSCULAR | Status: DC | PRN

## 2016-10-02 MED ORDER — OXYCODONE HCL 5 MG PO TABS
5.0000 mg | ORAL_TABLET | Freq: Four times a day (QID) | ORAL | Status: DC | PRN
  Administered 2016-10-02: 10 mg via ORAL

## 2016-10-02 MED ORDER — ARTIFICIAL TEARS OP OINT
TOPICAL_OINTMENT | OPHTHALMIC | Status: DC | PRN
  Administered 2016-10-02: 1 via OPHTHALMIC

## 2016-10-02 MED ORDER — EPHEDRINE 5 MG/ML INJ
INTRAVENOUS | Status: AC
  Filled 2016-10-02: qty 10

## 2016-10-02 MED ORDER — MEPERIDINE HCL 25 MG/ML IJ SOLN: 6.2500 mg | INTRAMUSCULAR | Status: DC | PRN

## 2016-10-02 MED ORDER — EPHEDRINE SULFATE 50 MG/ML IJ SOLN
INTRAMUSCULAR | Status: DC | PRN
  Administered 2016-10-02: 10 mg via INTRAVENOUS

## 2016-10-02 MED ORDER — LIDOCAINE HCL (CARDIAC) 20 MG/ML IV SOLN
INTRAVENOUS | Status: DC | PRN
  Administered 2016-10-02: 40 mg via INTRAVENOUS

## 2016-10-02 MED ORDER — ARTIFICIAL TEARS OP OINT
TOPICAL_OINTMENT | OPHTHALMIC | Status: AC
  Filled 2016-10-02: qty 3.5

## 2016-10-02 MED ORDER — ROCURONIUM BROMIDE 50 MG/5ML IV SOSY
PREFILLED_SYRINGE | INTRAVENOUS | Status: AC
  Filled 2016-10-02: qty 5

## 2016-10-02 MED ORDER — ROCURONIUM BROMIDE 100 MG/10ML IV SOLN
INTRAVENOUS | Status: DC | PRN
  Administered 2016-10-02: 40 mg via INTRAVENOUS

## 2016-10-02 MED ORDER — LIDOCAINE 2% (20 MG/ML) 5 ML SYRINGE
INTRAMUSCULAR | Status: AC
  Filled 2016-10-02: qty 10

## 2016-10-02 MED ORDER — OXYCODONE HCL 5 MG PO TABS
ORAL_TABLET | ORAL | Status: AC
  Administered 2016-10-02: 10 mg via ORAL
  Filled 2016-10-02: qty 2

## 2016-10-02 MED ORDER — DEXTROSE 5 % IV SOLN
INTRAVENOUS | Status: AC
  Filled 2016-10-02: qty 1.5

## 2016-10-02 MED ORDER — LIDOCAINE HCL 4 % EX SOLN
CUTANEOUS | Status: DC | PRN
  Administered 2016-10-02: 2 mL via TOPICAL

## 2016-10-02 SURGICAL SUPPLY — 45 items
APPLIER CLIP LOGIC TI 5 (MISCELLANEOUS) IMPLANT
BLADE SURG 15 STRL LF DISP TIS (BLADE) ×1 IMPLANT
BLADE SURG 15 STRL SS (BLADE) ×2
CANISTER SUCTION 2500CC (MISCELLANEOUS) ×3 IMPLANT
CLIP TI MEDIUM 24 (CLIP) ×3 IMPLANT
CLIP TI MEDIUM 6 (CLIP) IMPLANT
CONT SPEC 4OZ CLIKSEAL STRL BL (MISCELLANEOUS) ×6 IMPLANT
COVER SURGICAL LIGHT HANDLE (MISCELLANEOUS) ×6 IMPLANT
DERMABOND ADVANCED (GAUZE/BANDAGES/DRESSINGS) ×2
DERMABOND ADVANCED .7 DNX12 (GAUZE/BANDAGES/DRESSINGS) ×1 IMPLANT
DRAPE CHEST BREAST 15X10 FENES (DRAPES) ×3 IMPLANT
ELECT REM PT RETURN 9FT ADLT (ELECTROSURGICAL) ×3
ELECTRODE REM PT RTRN 9FT ADLT (ELECTROSURGICAL) ×1 IMPLANT
GAUZE SPONGE 4X4 12PLY STRL (GAUZE/BANDAGES/DRESSINGS) ×3 IMPLANT
GAUZE SPONGE 4X4 16PLY XRAY LF (GAUZE/BANDAGES/DRESSINGS) ×6 IMPLANT
GLOVE BIO SURGEON STRL SZ 6.5 (GLOVE) ×4 IMPLANT
GLOVE BIO SURGEONS STRL SZ 6.5 (GLOVE) ×2
GLOVE SURG SIGNA 7.5 PF LTX (GLOVE) ×3 IMPLANT
GOWN STRL REUS W/ TWL LRG LVL3 (GOWN DISPOSABLE) ×1 IMPLANT
GOWN STRL REUS W/ TWL XL LVL3 (GOWN DISPOSABLE) ×1 IMPLANT
GOWN STRL REUS W/TWL LRG LVL3 (GOWN DISPOSABLE) ×2
GOWN STRL REUS W/TWL XL LVL3 (GOWN DISPOSABLE) ×2
HEMOSTAT SURGICEL 2X14 (HEMOSTASIS) IMPLANT
KIT BASIN OR (CUSTOM PROCEDURE TRAY) ×3 IMPLANT
KIT ROOM TURNOVER OR (KITS) ×3 IMPLANT
NS IRRIG 1000ML POUR BTL (IV SOLUTION) ×3 IMPLANT
PACK SURGICAL SETUP 50X90 (CUSTOM PROCEDURE TRAY) ×3 IMPLANT
PAD ARMBOARD 7.5X6 YLW CONV (MISCELLANEOUS) ×6 IMPLANT
PENCIL BUTTON HOLSTER BLD 10FT (ELECTRODE) ×3 IMPLANT
SPONGE INTESTINAL PEANUT (DISPOSABLE) IMPLANT
SUT SILK 2 0 (SUTURE)
SUT SILK 2-0 18XBRD TIE 12 (SUTURE) IMPLANT
SUT VIC AB 2-0 CT1 27 (SUTURE)
SUT VIC AB 2-0 CT1 TAPERPNT 27 (SUTURE) IMPLANT
SUT VIC AB 3-0 SH 18 (SUTURE) ×6 IMPLANT
SUT VICRYL 4-0 PS2 18IN ABS (SUTURE) ×6 IMPLANT
SWAB COLLECTION DEVICE MRSA (MISCELLANEOUS) IMPLANT
SYR BULB 3OZ (MISCELLANEOUS) ×3 IMPLANT
SYRINGE 10CC LL (SYRINGE) ×3 IMPLANT
TOWEL OR 17X24 6PK STRL BLUE (TOWEL DISPOSABLE) ×3 IMPLANT
TOWEL OR 17X26 10 PK STRL BLUE (TOWEL DISPOSABLE) ×3 IMPLANT
TUBE ANAEROBIC SPECIMEN COL (MISCELLANEOUS) IMPLANT
TUBE CONNECTING 12'X1/4 (SUCTIONS) ×1
TUBE CONNECTING 12X1/4 (SUCTIONS) ×2 IMPLANT
WATER STERILE IRR 1000ML POUR (IV SOLUTION) ×3 IMPLANT

## 2016-10-02 NOTE — Anesthesia Procedure Notes (Signed)
Procedure Name: Intubation Date/Time: 10/02/2016 8:35 AM Performed by: Suzy Bouchard Pre-anesthesia Checklist: Patient identified, Emergency Drugs available, Suction available, Patient being monitored and Timeout performed Patient Re-evaluated:Patient Re-evaluated prior to inductionOxygen Delivery Method: Circle system utilized Preoxygenation: Pre-oxygenation with 100% oxygen Intubation Type: IV induction Ventilation: Mask ventilation without difficulty Laryngoscope Size: Miller and 1 Grade View: Grade II Tube type: Oral Tube size: 7.5 mm Number of attempts: 1 Airway Equipment and Method: Stylet and LTA kit utilized Placement Confirmation: ETT inserted through vocal cords under direct vision,  positive ETCO2 and breath sounds checked- equal and bilateral Secured at: 22 cm Tube secured with: Tape Dental Injury: Teeth and Oropharynx as per pre-operative assessment

## 2016-10-02 NOTE — Interval H&P Note (Signed)
History and Physical Interval Note:  10/02/2016 8:00 AM  Kristin [name]  has presented today for surgery, with the diagnosis of adenocarcinoma  The various methods of treatment have been discussed with the patient and family. After consideration of risks, benefits and other options for treatment, the patient has consented to  Procedure(s): MEDIASTINOSCOPY (N/A) as a surgical intervention .  The patient's history has been reviewed, patient examined, no change in status, stable for surgery.  I have reviewed the patient's chart and labs.  Questions were answered to the patient's satisfaction.     [name]

## 2016-10-02 NOTE — H&P (View-Only) (Signed)
PCP is EASON,PAUL, MD Referring Provider is Mohamed, Mohamed, MD  No chief complaint on file.   HPI: 75 yo woman with a history of breast cancer, stage IV lung cancer, and recent colon cancer who was found to have mediastinal adenopathy on a recent f/u CT.  She was diagnosed with breast cancer in 2002 and had bilateral mastectomies. In 2012 she was found to have a lung nodule. She was stage I clinically. 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 She was diagnosed with colon cancer, stage I, in May and treated with resection. She is on Plavix for a TIA several years ago.  Her CT showed a 9 mm node between the innominate artery and right subclavian vein, a 9 mm pretracheal node and a left hilar node along the left mainstem bronchus. All 3 nodes were hypermetabolic by PET.  She has been feeling well. She has been losing weight since the Spring but has gained a few pounds back over the past couple of weeks.  Zubrod Score: At the time of surgery this patient's most appropriate activity status/level should be described as: [x]    0    Normal activity, no symptoms []    1    Restricted in physical strenuous activity but ambulatory, able to do out light work []    2    Ambulatory and capable of self care, unable to do work activities, up and about >50 % of waking hours                              []    3    Only limited self care, in bed greater than 50% of waking hours []    4    Completely disabled, no self care, confined to bed or chair []    5    Moribund    Past Medical History:  Diagnosis Date  . Anemia   . Anemia of chronic disease 09/21/2016  . Anxiety   . Arthritis    arthritis- left hip  . Blood transfusion without reported diagnosis    transfusion- 3-4 yrs ago -found to be anemic on routine lab check  . Breast cancer (HCC) 2002   bilateral- bilateral mastectomies done.  . 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 CVA  (cerebral infarction) 04/30/2013  . Family history of brain cancer   . Family history of breast cancer   . Family history of colon cancer 10/06/2009  . Family history of colon cancer   . Family history of prostate cancer   . GERD (gastroesophageal reflux disease)   . Glaucoma   . History of bilateral mastectomy 05/20/2010  . Hypertension   . Lung cancer (HCC) dx'd 12/2010   last Ct scan "no lung cancer" showing 11'16 CT Chest Epic.  . Malignant neoplasm of ascending colon (HCC) 01/29/2016  . Stroke (HCC)    2 yrs ago-no residual  . Tubular adenoma of colon 07/2011   colon polyps ans reoccurence with malignancy found    Past Surgical History:  Procedure Laterality Date  . APPENDECTOMY    . cataracts Bilateral   . COLON SURGERY  02/2016  . COLONOSCOPY    . LUNG SURGERY     Resection -" not done. Pt . tx with Tarceva  . MASTECTOMY Bilateral   . RECONSTRUCTION BREAST W/ TRAM FLAP Bilateral      bilaterally  . TEE WITHOUT CARDIOVERSION N/A 06/04/2013   Procedure: TRANSESOPHAGEAL ECHOCARDIOGRAM (TEE);  Surgeon: Jolaine Artist, MD;  Location: [facility] ENDOSCOPY;  Service: Cardiovascular;  Laterality: N/A;  . TONSILLECTOMY    . TOTAL HIP ARTHROPLASTY Left 08/07/2016   Procedure: LEFT TOTAL HIP ARTHROPLASTY ANTERIOR APPROACH;  Surgeon: Gaynelle Arabian, MD;  Location: [facility];  Service: Orthopedics;  Laterality: Left;    Family History  Problem Relation Age of Onset  . Lung cancer Father   . Heart disease Mother   . Clotting disorder Mother 18    coronary thrombosis  . Colon cancer Sister     dx in her 78s  . Breast cancer Sister 37  . Multiple myeloma Paternal Grandmother   . Brain cancer Paternal Uncle   . Cancer Paternal Aunt     unknown  . Colon cancer Maternal Aunt 80  . Prostate cancer Maternal Uncle     dx in his late 65s  . Other Maternal Uncle     brain tumor dx in his 100s  . Lung cancer Maternal Uncle   . Cancer Maternal Uncle     NOS  . Stomach cancer Paternal Aunt   .  Brain cancer Cousin     maternal first cousin dx in late 63s-50s  . Cancer Paternal Uncle     NOS  . Colon cancer Cousin     paternal cousin dx in his 26s  . Cancer Cousin     paternal cousin dx with NOS cancer    Social History Social History  Substance Use Topics  . Smoking status: Never Smoker  . Smokeless tobacco: Never Used  . Alcohol use 0.0 oz/week     Comment: ocassional beer    Current Outpatient Prescriptions  Medication Sig Dispense Refill  . amLODipine (NORVASC) 5 MG tablet Take 5 mg by mouth daily.      . brimonidine-timolol (COMBIGAN) 0.2-0.5 % ophthalmic solution Place 1 drop into the right eye every 12 (twelve) hours.    . clopidogrel (PLAVIX) 75 MG tablet Take 75 mg by mouth daily.    . cycloSPORINE (RESTASIS) 0.05 % ophthalmic emulsion Place 1 drop into both eyes 2 (two) times daily.    Marland Kitchen erlotinib (TARCEVA) 150 MG tablet Take 1 tablet (150 mg total) by mouth daily. Take on an empty stomach 1 hour before meals or 2 hours after. (Patient taking differently: Take 150 mg by mouth daily. Take on an empty stomach 1 hour before meals or 2 hours after. States take at bedtime) 30 tablet 3  . escitalopram (LEXAPRO) 10 MG tablet Take 10 mg by mouth daily.     . famotidine (PEPCID) 40 MG tablet Take 40 mg by mouth daily.    Marland Kitchen FeFum-FePoly-FA-B Cmp-C-Biot (FOLIVANE-PLUS) CAPS TAKE 1 CAPSULE BY MOUTH DAILY 90 capsule 1  . letrozole (FEMARA) 2.5 MG tablet TAKE ONE TABLET EACH DAY 30 tablet 2  . lisinopril (PRINIVIL,ZESTRIL) 20 MG tablet Take 20 mg by mouth daily.     Marland Kitchen zolpidem (AMBIEN) 5 MG tablet Take 5 mg by mouth at bedtime as needed for sleep. Reported on 04/25/2016     No current facility-administered medications for this visit.     Allergies  Allergen Reactions  . Doxycycline Nausea Only  . Sulfa Antibiotics Rash    Review of Systems  Constitutional: Negative for activity change, appetite change, chills, fever and unexpected weight change.  HENT: Negative for  trouble swallowing and voice change.   Eyes: Negative for  visual disturbance.  Respiratory: Negative for shortness of breath and wheezing.   Cardiovascular: Negative for chest pain and leg swelling.  Gastrointestinal: Positive for diarrhea.       Hiatal hernia  Musculoskeletal: Negative for arthralgias and myalgias.  Neurological: Negative for seizures and syncope.  Hematological: Positive for adenopathy (on CT). Bruises/bleeds easily.  All other systems reviewed and are negative.   BP (!) 173/65   Pulse 65   Temp 98.6 F (37 C)   Ht 5' 8" (1.727 m)   Wt 135 lb 9.6 oz (61.5 kg)   BMI 20.62 kg/m  Physical Exam  Constitutional: She is oriented to person, place, and time. She appears well-developed and well-nourished. No distress.  HENT:  Head: Normocephalic and atraumatic.  Mouth/Throat: No oropharyngeal exudate.  Eyes: Conjunctivae and EOM are normal. No scleral icterus.  Neck: Neck supple. No thyromegaly present.  Cardiovascular: Normal rate, regular rhythm, normal heart sounds and intact distal pulses.   No murmur heard. Pulmonary/Chest: Breath sounds normal. No respiratory distress. She has no wheezes. She has no rales.  Abdominal: Soft. She exhibits no distension. There is no tenderness.  Musculoskeletal: She exhibits no edema.  Lymphadenopathy:    She has no cervical adenopathy.  Neurological: She is alert and oriented to person, place, and time. No cranial nerve deficit.  Skin: Skin is warm and dry.  Vitals reviewed.    Diagnostic Tests: CT CHEST FINDINGS  Cardiovascular: Aortic and branch vessel atherosclerosis. Tortuous thoracic aorta. Normal heart size, without pericardial effusion. Multivessel coronary artery atherosclerosis. No central pulmonary embolism, on this non-dedicated study.  Mediastinum/Nodes: Bilateral hypo attenuating thyroid nodules are nonspecific. No supraclavicular adenopathy. Mastectomy and breast reconstruction. High right mediastinal  node measures 9 mm on image 13/series 2 and is new since the prior exam.  A precarinal node measures 9 mm on image 21/series 2 versus 6 mm on the prior  Left mediastinal node measures 13 mm on image 28/series 2 versus 5 mm on the prior. No hilar adenopathy. Moderate hiatal hernia.  Lungs/Pleura: No pleural fluid. Right middle lobe volume loss. Minimal subpleural right lower lobe nodularity is unchanged, including on image 73/series 6.  Status post left upper lobe wedge resection.  Musculoskeletal: No acute osseous abnormality. NUCLEAR MEDICINE PET SKULL BASE TO THIGH  TECHNIQUE: 8.1 mCi F-18 FDG was injected intravenously. Full-ring PET imaging was performed from the skull base to thigh after the radiotracer. CT data was obtained and used for attenuation correction and anatomic localization.  FASTING BLOOD GLUCOSE:  Value: 103 mg/dl  COMPARISON:  CT scan 09/10/2016  FINDINGS: NECK  No hypermetabolic lymph nodes in the neck.  CHEST  New hypermetabolic high RIGHT paratracheal lymph node measures 10 mm short axis with SUV max 6.1. Precarinal lymph node measuring 10 mm SUV max equal 4.8. Subcarinal lymph node along the LEFT mainstem bronchus also is hypermetabolic.  No hypermetabolic pulmonary nodules. Postsurgical change in the LEFT upper lobe without evidence local recurrence.  ABDOMEN/PELVIS  There is no evidence of hepatic metastasis. Adrenal glands are normal. No hypermetabolic abdominal or pelvic lymph nodes.  Postsurgical change consistent RIGHT hemicolectomy.  Calcified leiomyoma within the uterus.  SKELETON  No focal hypermetabolic activity to suggest skeletal metastasis.  IMPRESSION: 1. Small hypermetabolic mediastinal lymph nodes are consistent with mediastinal nodal metastasis. 2. No hypermetabolic pulmonary nodules. 3. No evidence of carcinoma recurrence or metastasis in the abdomen or pelvis.   Electronically Signed    By: Stewart  Edmunds M.D.   On: 09/20/2016   11:12 I personally reviewed the CT and PET images and concur with the findings noted above.  Impression: 75 yo woman with a history of multiple malignancies who presents with new mediastinal adenopathy on CT that is hypermetabolic by PET. She has had a remarkable response to Tarceva, but the mediastinal nodes most likely are recurrent lung cancer. Breast is also possible but her primary was 15 years ago. Colon is very unlikely given the distribution of the nodes. In any event she needs biopsy to confirm the diagnosis and will need sufficient tissue to allow for metabolic testing. For those reasons I favor mediastinoscopy for diagnostic purposes. EBUS would allow access to the node along the left main stem but is unlikely to yield sufficient tissue.  I discussed the general nature of the procedure, the need for general anesthesia ,and the incision to be used with Mrs. Ospina and her family. This will be done in the OR on an outpatient basis. I informed them of the indications, risks, benefits and alternatives, as well as the difficulties anticipated. They understand the risks include but are not limited to death, stroke, MI, DVT/PE, bleeding, possible need for transfusion, infection, pneumothorax, and recurrent nerve injury with vocal cord paralysis. She accepts the risks and agrees to proceed.  Hold Plavix after today  Plan:  Mediastinoscopy on Wed 10/02/16  [name], MD [facility] [phone]

## 2016-10-02 NOTE — Brief Op Note (Signed)
10/02/2016  10:04 AM  PATIENT:  Kristin [name]  75 y.o. female  PRE-OPERATIVE DIAGNOSIS:  Mediastinal adenopathy  POST-OPERATIVE DIAGNOSIS:  Metastatic adenocarcinoma  PROCEDURE:  Procedure(s): MEDIASTINOSCOPY (N/A)  SURGEON:  Surgeon(s) and Role:    * [name], MD - Primary  ANESTHESIA:   general  EBL:  No intake/output data recorded.  BLOOD ADMINISTERED:none  DRAINS: none   LOCAL MEDICATIONS USED:  NONE  SPECIMEN:  Source of Specimen:  lymph node  DISPOSITION OF SPECIMEN:  PATHOLOGY  COUNTS:  YES  PLAN OF CARE: Discharge to home after PACU  PATIENT DISPOSITION:  PACU - hemodynamically stable.   Delay start of Pharmacological VTE agent (>24hrs) due to surgical blood loss or risk of bleeding: not applicable  Frozen= metastatic adenocarcinoma

## 2016-10-02 NOTE — Transfer of Care (Signed)
Immediate Anesthesia Transfer of Care Note  Patient: Kristin [name]  Procedure(s) Performed: Procedure(s): MEDIASTINOSCOPY (N/A)  Patient Location: PACU  Anesthesia Type:General  Level of Consciousness: awake and alert   Airway & Oxygen Therapy: Patient Spontanous Breathing and Patient connected to nasal cannula oxygen  Post-op Assessment: Report given to RN, Post -op Vital signs reviewed and stable and Patient moving all extremities X 4  Post vital signs: Reviewed and stable  Last Vitals:  Vitals:   10/02/16 0634 10/02/16 0654  BP: (!) 197/61 (!) 157/56  Pulse: (!) 58   Resp: 18   Temp: 36.4 C     Last Pain:  Vitals:   10/02/16 0634  TempSrc: Oral         Complications: No apparent anesthesia complications

## 2016-10-02 NOTE — Anesthesia Preprocedure Evaluation (Addendum)
Anesthesia Evaluation  Patient identified by MRN, date of birth, ID band Patient awake    Reviewed: Allergy & Precautions, NPO status , Patient's Chart, lab work & pertinent test results  Airway Mallampati: I  TM Distance: >3 FB Neck ROM: Full    Dental  (+) Teeth Intact, Dental Advisory Given, Caps   Pulmonary    Pulmonary exam normal        Cardiovascular hypertension, Pt. on medications Normal cardiovascular exam     Neuro/Psych Anxiety Depression CVA    GI/Hepatic GERD  Medicated and Controlled,  Endo/Other    Renal/GU      Musculoskeletal   Abdominal   Peds  Hematology   Anesthesia Other Findings   Reproductive/Obstetrics                            Anesthesia Physical Anesthesia Plan  ASA: III  Anesthesia Plan: General   Post-op Pain Management:    Induction: Intravenous  Airway Management Planned: Oral ETT  Additional Equipment:   Intra-op Plan:   Post-operative Plan: Extubation in OR  Informed Consent: I have reviewed the patients History and Physical, chart, labs and discussed the procedure including the risks, benefits and alternatives for the proposed anesthesia with the patient or authorized representative who has indicated his/her understanding and acceptance.     Plan Discussed with: CRNA and Surgeon  Anesthesia Plan Comments:         Anesthesia Quick Evaluation

## 2016-10-02 NOTE — Anesthesia Postprocedure Evaluation (Signed)
Anesthesia Post Note  Patient: Kristin [name]  Procedure(s) Performed: Procedure(s) (LRB): MEDIASTINOSCOPY (N/A)  Patient location during evaluation: PACU Anesthesia Type: General Level of consciousness: awake and alert Pain management: pain level controlled Vital Signs Assessment: post-procedure vital signs reviewed and stable Respiratory status: spontaneous breathing, nonlabored ventilation, respiratory function stable and patient connected to nasal cannula oxygen Cardiovascular status: blood pressure returned to baseline and stable Postop Assessment: no signs of nausea or vomiting Anesthetic complications: no    Last Vitals:  Vitals:   10/02/16 1115 10/02/16 1129  BP: (!) 155/54 (!) 149/83  Pulse: (!) 59 61  Resp: 16 18  Temp: 36.7 C     Last Pain:  Vitals:   10/02/16 1129  TempSrc:   PainSc: 0-No pain                 Charleigh Correnti DAVID

## 2016-10-02 NOTE — Discharge Instructions (Addendum)
Do not drive or engage in heavy physical activity for 24 hours  You may resume normal activities and shower tomorrow  There is a medical adhesive over the incision. It will begin to peel off in 0-14 days  You have a prescription for oxycodone, a narcotic pain reliever, use as directed  You may use acetaminophen (Tylenol) or ibuprofen (Advil) in addition to, or instead of, the oxycodone  Do not drive within 4 hours of taking oxycodone  Follow up with Dr. Julien Nordmann as scheduled  My office will contact you with a follow up visit  Call [phone] if you develop chest pain, shortness of breath, fever > 101 F, or if you notice excessive swelling, redness or drainage from the incision

## 2016-10-03 ENCOUNTER — Encounter (HOSPITAL_COMMUNITY): Payer: Self-pay | Admitting: Thoracic Surgery (Cardiothoracic Vascular Surgery)

## 2016-10-03 NOTE — Op Note (Signed)
NAMEKINDRED, HEYING             ACCOUNT NO.:  [id number]  MEDICAL RECORD NO.:  [id number]  LOCATION:                                 FACILITY:  PHYSICIAN:  Revonda Standard. Roxan Hockey, M.D.DATE OF BIRTH:  19-Nov-1940  DATE OF PROCEDURE:  10/02/2016 DATE OF DISCHARGE:                              OPERATIVE REPORT   PREOPERATIVE DIAGNOSIS:  Mediastinal adenopathy.  POSTOPERATIVE DIAGNOSIS:  Metastatic adenocarcinoma.  PROCEDURE:  Mediastinoscopy.  SURGEON:  Revonda Standard. Roxan Hockey, M.D.  ASSISTANT:  None.  ANESTHESIA:  General.  FINDINGS:  Enlarged 4R node.  Frozen section positive for metastatic adenocarcinoma.  CLINICAL NOTE:  Mrs. Laye is a 75 year old woman with history of breast cancer, stage IV lung cancer, and more recently colon cancer, who was found to have mediastinal adenopathy on a recent followup CT.  On PET, the adenopathy was hypermetabolic.  She was advised to undergo mediastinoscopy for diagnostic purposes.  The indications, risks, benefits, and alternatives were discussed in detail with the patient. She understood and accepted the risks and agreed to proceed.  OPERATIVE NOTE:  Mrs. Mosher was brought to the preoperative holding area on October 02, 2016.  Anesthesia established intravenous access and placed an arterial blood pressure monitoring line.  She was taken to the operating room, anesthetized, and intubated.  Intravenous antibiotics were administered.  Sequential compression devices were placed on the calves for DVT prophylaxis.  The neck and chest were prepped and draped in usual sterile fashion.  After performing a time-out, a transverse incision was made 1 fingerbreadth above the sternal notch, it was carried through the skin and subcutaneous tissue.  The strap muscles were separated in the midline.  The pretracheal fascia was identified and incised, and the pretracheal plane was developed bluntly into the mediastinum. Mediastinoscope was  inserted and gentle dissection of the mediastinal tissues was carried out.  In the 4R paratracheal location, there was a large node that was whitish in color and firm in texture.  The surrounding tissue was dissected off and the node was removed intact.  A small piece of the node was sent for frozen section.  The rest was maintained for permanent pathology.  The wound was packed with gauze. The frozen section returned showing metastatic adenocarcinoma.  The gauze packing was removed.  The mediastinoscope was reinserted.  There was good hemostasis.  The mediastinoscope was removed.  The incision was closed in 2 layers, and Dermabond was applied.  The patient was extubated in the operating room and taken to postanesthetic care unit in good condition.     Revonda Standard Roxan Hockey, M.D.     SCH/MEDQ  D:  10/02/2016  T:  10/03/2016  Job:  [id number]

## 2016-10-10 ENCOUNTER — Other Ambulatory Visit: Payer: Self-pay | Admitting: Medical Oncology

## 2016-10-10 DIAGNOSIS — C3412 Malignant neoplasm of upper lobe, left bronchus or lung: Secondary | ICD-10-CM

## 2016-10-11 ENCOUNTER — Telehealth: Payer: Self-pay | Admitting: Internal Medicine

## 2016-10-11 ENCOUNTER — Other Ambulatory Visit (HOSPITAL_BASED_OUTPATIENT_CLINIC_OR_DEPARTMENT_OTHER): Payer: Medicare Other

## 2016-10-11 ENCOUNTER — Encounter: Payer: Self-pay | Admitting: Internal Medicine

## 2016-10-11 ENCOUNTER — Encounter (HOSPITAL_COMMUNITY): Payer: Self-pay

## 2016-10-11 ENCOUNTER — Ambulatory Visit (HOSPITAL_BASED_OUTPATIENT_CLINIC_OR_DEPARTMENT_OTHER): Payer: Medicare Other | Admitting: Internal Medicine

## 2016-10-11 VITALS — BP 165/46 | HR 58 | Temp 97.7°F | Resp 17 | Ht 68.0 in | Wt 135.7 lb

## 2016-10-11 DIAGNOSIS — C182 Malignant neoplasm of ascending colon: Secondary | ICD-10-CM | POA: Diagnosis not present

## 2016-10-11 DIAGNOSIS — C50111 Malignant neoplasm of central portion of right female breast: Secondary | ICD-10-CM

## 2016-10-11 DIAGNOSIS — Z170 Estrogen receptor positive status [ER+]: Secondary | ICD-10-CM | POA: Diagnosis not present

## 2016-10-11 DIAGNOSIS — C50112 Malignant neoplasm of central portion of left female breast: Secondary | ICD-10-CM

## 2016-10-11 DIAGNOSIS — Z853 Personal history of malignant neoplasm of breast: Secondary | ICD-10-CM

## 2016-10-11 DIAGNOSIS — I10 Essential (primary) hypertension: Secondary | ICD-10-CM | POA: Diagnosis not present

## 2016-10-11 DIAGNOSIS — C3412 Malignant neoplasm of upper lobe, left bronchus or lung: Secondary | ICD-10-CM | POA: Diagnosis present

## 2016-10-11 LAB — COMPREHENSIVE METABOLIC PANEL
ALT: 12 U/L (ref 0–55)
ANION GAP: 7 meq/L (ref 3–11)
AST: 17 U/L (ref 5–34)
Albumin: 3.3 g/dL — ABNORMAL LOW (ref 3.5–5.0)
Alkaline Phosphatase: 127 U/L (ref 40–150)
BILIRUBIN TOTAL: 0.66 mg/dL (ref 0.20–1.20)
BUN: 18.5 mg/dL (ref 7.0–26.0)
CALCIUM: 9.4 mg/dL (ref 8.4–10.4)
CO2: 25 mEq/L (ref 22–29)
CREATININE: 0.8 mg/dL (ref 0.6–1.1)
Chloride: 108 mEq/L (ref 98–109)
EGFR: 67 mL/min/{1.73_m2} — ABNORMAL LOW (ref >90)
Glucose: 82 mg/dl (ref 70–140)
Potassium: 4.2 mEq/L (ref 3.5–5.1)
Sodium: 140 mEq/L (ref 136–145)
TOTAL PROTEIN: 6.5 g/dL (ref 6.4–8.3)

## 2016-10-11 LAB — CBC WITH DIFFERENTIAL/PLATELET
BASO%: 1.4 % (ref 0.0–2.0)
Basophils Absolute: 0.1 10*3/uL (ref 0.0–0.1)
EOS%: 3.8 % (ref 0.0–7.0)
Eosinophils Absolute: 0.2 10*3/uL (ref 0.0–0.5)
HEMATOCRIT: 33.5 % — AB (ref 34.8–46.6)
HEMOGLOBIN: 10.7 g/dL — AB (ref 11.6–15.9)
LYMPH#: 0.8 10*3/uL — AB (ref 0.9–3.3)
LYMPH%: 16.1 % (ref 14.0–49.7)
MCH: 27.7 pg (ref 25.1–34.0)
MCHC: 31.9 g/dL (ref 31.5–36.0)
MCV: 86.9 fL (ref 79.5–101.0)
MONO#: 0.8 10*3/uL (ref 0.1–0.9)
MONO%: 16.5 % — ABNORMAL HIGH (ref 0.0–14.0)
NEUT%: 62.2 % (ref 38.4–76.8)
NEUTROS ABS: 3.1 10*3/uL (ref 1.5–6.5)
PLATELETS: 416 10*3/uL — AB (ref 145–400)
RBC: 3.85 10*6/uL (ref 3.70–5.45)
RDW: 14.7 % — AB (ref 11.2–14.5)
WBC: 5 10*3/uL (ref 3.9–10.3)

## 2016-10-11 NOTE — Telephone Encounter (Signed)
Appointments scheduled per 12/22 LOS. Patient given AVS report and calendars with future scheduled appointments. Patient requested to have labs at 9:30 AM and Office visit at 10 AM due to difficulty traveling in early AM.

## 2016-10-11 NOTE — Progress Notes (Signed)
[place] [phone]   Fax:([phone]) [phone]  OFFICE PROGRESS NOTE  Eber Hong, MD [address] 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 2) adenocarcinoma of the ascending colon (T2, N0, M0) with MS IV high diagnosed in March 2017. 3) history of breast adenocarcinoma.  PRIOR THERAPY: Status post right colectomy with lymph node dissection.  CURRENT THERAPY: 1) Tarceva 150 mg by mouth daily status post 68 months of treatment. 2) observation for adenocarcinoma of the colon. 3) Femara 2.5 mg by mouth daily for breast cancer.  INTERVAL HISTORY: Kristin [name] 75 y.o. female returns to the clinic today for follow-up visit accompanied by her husband and daughter [name]. The patient is feeling fine today was no specific complaints except for soreness in the shoulders bilaterally and some thickening in her throat after the recent mediastinoscopy. She underwent mediastinoscopy under the care of Dr. Roxan Hockey on 10/02/2016. The final pathology was consistent with adenocarcinoma lung primary. The tissue block was sent to [facility] one for molecular studies and PDL 1 expression. The results are still pending. The patient is here today for evaluation and discussion of her treatment options. She continues to have elevated blood pressure but mainly when she comes to her visit. She denied any chest pain, shortness of breath, cough or hemoptysis. She has no fever or chills. She denied having any weight loss or night sweats. She has no nausea, vomiting, diarrhea or constipation.  MEDICAL HISTORY: Past Medical History:  Diagnosis Date  . Anemia   . Anemia of chronic disease 09/21/2016  . Anxiety   . Arthritis    arthritis- left hip  . Blood transfusion without reported diagnosis    transfusion- 3-4 yrs ago -found to be anemic on routine lab check    . Breast cancer ([place]) 2002   bilateral- bilateral mastectomies done.  . 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 CVA (cerebral infarction) 04/30/2013  . Family history of brain cancer   . Family history of breast cancer   . Family history of colon cancer 10/06/2009  . Family history of colon cancer   . Family history of prostate cancer   . GERD (gastroesophageal reflux disease)   . Glaucoma   . History of bilateral mastectomy 05/20/2010  . Hypertension   . Lung cancer ([place]) dx'd 12/2010   last Ct scan "no lung cancer" showing 11'16 CT Chest Epic.  . Malignant neoplasm of ascending colon ([place]) 01/29/2016  . Stroke [facility])    2 yrs ago-no residual  . Tubular adenoma of colon 07/2011   colon polyps ans reoccurence with malignancy found    ALLERGIES:  is allergic to doxycycline and sulfa antibiotics.  MEDICATIONS:  Current Outpatient Prescriptions  Medication Sig Dispense Refill  . amLODipine (NORVASC) 5 MG tablet Take 5 mg by mouth daily.      . brimonidine-timolol (COMBIGAN) 0.2-0.5 % ophthalmic solution Place 1 drop into the right eye every 12 (twelve) hours.    . clopidogrel (PLAVIX) 75 MG tablet Take 75 mg by mouth daily.    . cycloSPORINE (RESTASIS) 0.05 % ophthalmic emulsion Place 1 drop into both eyes 2 (two) times daily.    Marland Kitchen erlotinib (TARCEVA) 150 MG tablet Take 1 tablet (150 mg total) by mouth daily. Take on an empty stomach 1 hour before meals or 2 hours after. 30 tablet 3  . escitalopram (LEXAPRO) 10 MG tablet Take  10 mg by mouth daily.     . famotidine (PEPCID) 40 MG tablet Take 40 mg by mouth daily.    Marland Kitchen FeFum-FePoly-FA-B Cmp-C-Biot (FOLIVANE-PLUS) CAPS TAKE 1 CAPSULE BY MOUTH DAILY 90 capsule 1  . letrozole (FEMARA) 2.5 MG tablet TAKE ONE TABLET EACH DAY 30 tablet 2  . lisinopril (PRINIVIL,ZESTRIL) 20 MG tablet Take 20 mg by mouth daily.     Marland Kitchen oxyCODONE (OXY IR/ROXICODONE) 5 MG immediate release tablet Take 1-2 tablets (5-10 mg total) by mouth every 6 (six) hours as needed for  moderate pain or severe pain. 30 tablet 0  . zolpidem (AMBIEN) 5 MG tablet Take 5 mg by mouth at bedtime as needed for sleep. Reported on 04/25/2016     No current facility-administered medications for this visit.     SURGICAL HISTORY:  Past Surgical History:  Procedure Laterality Date  . APPENDECTOMY    . cataracts Bilateral   . COLON SURGERY  02/2016  . COLONOSCOPY    . EYE SURGERY    . LUNG SURGERY     Resection -" not done. Pt . tx with Tarceva  . MASTECTOMY Bilateral   . MEDIASTINOSCOPY N/A 10/02/2016   Procedure: MEDIASTINOSCOPY;  Surgeon: [name], MD;  Location: [facility] OR;  Service: Thoracic;  Laterality: N/A;  . RECONSTRUCTION BREAST W/ TRAM FLAP Bilateral    bilaterally  . TEE WITHOUT CARDIOVERSION N/A 06/04/2013   Procedure: TRANSESOPHAGEAL ECHOCARDIOGRAM (TEE);  Surgeon: Jolaine Artist, MD;  Location: [facility] ENDOSCOPY;  Service: Cardiovascular;  Laterality: N/A;  . TONSILLECTOMY    . TOTAL HIP ARTHROPLASTY Left 08/07/2016   Procedure: LEFT TOTAL HIP ARTHROPLASTY ANTERIOR APPROACH;  Surgeon: Gaynelle Arabian, MD;  Location: [facility] ORS;  Service: Orthopedics;  Laterality: Left;    REVIEW OF SYSTEMS:  Constitutional: negative Eyes: negative Ears, nose, mouth, throat, and face: negative Respiratory: negative Cardiovascular: negative Gastrointestinal: negative Genitourinary:negative Integument/breast: negative Hematologic/lymphatic: negative Musculoskeletal:positive for arthralgias Neurological: negative Behavioral/Psych: negative Endocrine: negative Allergic/Immunologic: negative   PHYSICAL EXAMINATION: General appearance: alert, cooperative and no distress Head: Normocephalic, without obvious abnormality, atraumatic Neck: no adenopathy, no JVD, supple, symmetrical, trachea midline and thyroid not enlarged, symmetric, no tenderness/mass/nodules Lymph nodes: Cervical, supraclavicular, and axillary nodes normal. Resp: clear to auscultation bilaterally Back:  symmetric, no curvature. ROM normal. No CVA tenderness. Cardio: regular rate and rhythm, S1, S2 normal, no murmur, click, rub or gallop GI: soft, non-tender; bowel sounds normal; no masses,  no organomegaly Extremities: extremities normal, atraumatic, no cyanosis or edema Neurologic: Alert and oriented X 3, normal strength and tone. Normal symmetric reflexes. Normal coordination and gait  ECOG PERFORMANCE STATUS: 1 - Symptomatic but completely ambulatory  Blood pressure (!) 165/46, pulse (!) 58, temperature 97.7 F (36.5 C), temperature source Oral, resp. rate 17, height '5\' 8"'$  (1.727 m), weight 135 lb 11.2 oz (61.6 kg), SpO2 100 %.  LABORATORY DATA: Lab Results  Component Value Date   WBC 5.0 10/11/2016   HGB 10.7 (L) 10/11/2016   HCT 33.5 (L) 10/11/2016   MCV 86.9 10/11/2016   PLT 416 (H) 10/11/2016      Chemistry      Component Value Date/Time   NA 138 09/30/2016 1100   NA 138 09/10/2016 1359   K 4.3 09/30/2016 1100   K 4.2 09/10/2016 1359   CL 105 09/30/2016 1100   CL 107 04/05/2013 0754   CO2 28 09/30/2016 1100   CO2 26 09/10/2016 1359   BUN 13 09/30/2016 1100   BUN 16.8 09/10/2016  1359   CREATININE 0.92 09/30/2016 1100   CREATININE 0.9 09/10/2016 1359      Component Value Date/Time   CALCIUM 9.7 09/30/2016 1100   CALCIUM 10.0 09/10/2016 1359   ALKPHOS 108 09/30/2016 1100   ALKPHOS 133 09/10/2016 1359   AST 21 09/30/2016 1100   AST 18 09/10/2016 1359   ALT 15 09/30/2016 1100   ALT 13 09/10/2016 1359   BILITOT 1.0 09/30/2016 1100   BILITOT 0.79 09/10/2016 1359       RADIOGRAPHIC STUDIES: Nm Pet Image Restag (ps) Skull Base To Thigh  Result Date: 09/20/2016 CLINICAL DATA:  Subsequent treatment strategy for lung carcinoma. Additional history of colorectal carcinoma. Enlarging mediastinal lymph nodes. EXAM: NUCLEAR MEDICINE PET SKULL BASE TO THIGH TECHNIQUE: 8.1 mCi F-18 FDG was injected intravenously. Full-ring PET imaging was performed from the skull base to  thigh after the radiotracer. CT data was obtained and used for attenuation correction and anatomic localization. FASTING BLOOD GLUCOSE:  Value: 103 mg/dl COMPARISON:  CT scan 14/92/1886 FINDINGS: NECK No hypermetabolic lymph nodes in the neck. CHEST New hypermetabolic high RIGHT paratracheal lymph node measures 10 mm short axis with SUV max 6.1. Precarinal lymph node measuring 10 mm SUV max equal 4.8. Subcarinal lymph node along the LEFT mainstem bronchus also is hypermetabolic. No hypermetabolic pulmonary nodules. Postsurgical change in the LEFT upper lobe without evidence local recurrence. ABDOMEN/PELVIS There is no evidence of hepatic metastasis. Adrenal glands are normal. No hypermetabolic abdominal or pelvic lymph nodes. Postsurgical change consistent RIGHT hemicolectomy. Calcified leiomyoma within the uterus. SKELETON No focal hypermetabolic activity to suggest skeletal metastasis. IMPRESSION: 1. Small hypermetabolic mediastinal lymph nodes are consistent with mediastinal nodal metastasis. 2. No hypermetabolic pulmonary nodules. 3. No evidence of carcinoma recurrence or metastasis in the abdomen or pelvis. Electronically Signed   By: Genevive Bi M.D.   On: 09/20/2016 11:12    ASSESSMENT AND PLAN: This is a very pleasant 75 years old white female with: 1) stage IV non-small cell lung cancer, adenocarcinoma with positive EGFR mutation currently on treatment with Tarceva status post 68 months. Unfortunately the patient has evidence for disease progression with disease recurrence in the mediastinal lymph nodes. The molecular studies are still pending to rule out any resistant mutation. If the patient has T790M resistant mutation, I will switch her treatment to Tagrisso. If there is no resistant mutation, I may consider the patient for a course of concurrent chemoradiation with weekly carboplatin and paclitaxel followed by continuous treatment with Tarceva. 2) stage II colon adenocarcinoma: We will  continue to monitor for now with repeat blood work and tumor marker. 3) history of breast cancer: The patient will continue treatment with Femara. 4) hypertension: She was advised to check her blood pressure at home and if it continues to be elevated to reconsult with her primary care physician. She will continue on Norvasc and lisinopril for now. 5) anemia of neoplastic disease: Stable, I will continue on the oral iron tablet. The patient would come back for follow-up visit in 2 weeks for reevaluation and more detailed discussion of her treatment options based on the molecular studies. The patient voices understanding of current disease status and treatment options and is in agreement with the current care plan.  All questions were answered. The patient knows to call the clinic with any problems, questions or concerns. We can certainly see the patient much sooner if necessary.   Disclaimer: This note was dictated with voice recognition software. Similar sounding words can inadvertently be  transcribed and may not be corrected upon review.

## 2016-10-22 ENCOUNTER — Encounter: Payer: Self-pay | Admitting: Thoracic Surgery (Cardiothoracic Vascular Surgery)

## 2016-10-22 ENCOUNTER — Ambulatory Visit (INDEPENDENT_AMBULATORY_CARE_PROVIDER_SITE_OTHER): Payer: Medicare Other | Admitting: Thoracic Surgery (Cardiothoracic Vascular Surgery)

## 2016-10-22 VITALS — BP 167/79 | HR 69 | Resp 16 | Ht 68.0 in | Wt 135.0 lb

## 2016-10-22 DIAGNOSIS — C799 Secondary malignant neoplasm of unspecified site: Secondary | ICD-10-CM | POA: Diagnosis not present

## 2016-10-22 DIAGNOSIS — R590 Localized enlarged lymph nodes: Secondary | ICD-10-CM | POA: Diagnosis not present

## 2016-10-22 NOTE — Progress Notes (Signed)
[address]       [place] [address]             [phone]     HPI: Kristin [name] turns today for follow-up after recent mediastinoscopy.  She is 76 year old woman with a history of breast cancer, stage IV lung cancer diagnosed over 5 years ago and more recently colon cancer who was found to have mediastinal adenopathy on a CT scan.  She underwent mediastinoscopy on 10/02/2016. Mediastinal lymph nodes were positive for metastatic adenocarcinoma with a lung primary.  She did not have any problems after the surgery. She's not requiring any pain medication.  She saw Dr. Julien Nordmann, but the molecular testing was still pending.  Past Medical History:  Diagnosis Date  . Anemia   . Anemia of chronic disease 09/21/2016  . Anxiety   . Arthritis    arthritis- left hip  . Blood transfusion without reported diagnosis    transfusion- 3-4 yrs ago -found to be anemic on routine lab check  . Breast cancer ([place]) 2002   bilateral- bilateral mastectomies done.  . 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 CVA (cerebral infarction) 04/30/2013  . Family history of brain cancer   . Family history of breast cancer   . Family history of colon cancer 10/06/2009  . Family history of colon cancer   . Family history of prostate cancer   . GERD (gastroesophageal reflux disease)   . Glaucoma   . History of bilateral mastectomy 05/20/2010  . Hypertension   . Lung cancer ([place]) dx'd 12/2010   last Ct scan "no lung cancer" showing 11'16 CT Chest Epic.  . Malignant neoplasm of ascending colon ([place]) 01/29/2016  . Stroke [facility])    2 yrs ago-no residual  . Tubular adenoma of colon 07/2011   colon polyps ans reoccurence with malignancy found     Current Outpatient Prescriptions  Medication Sig Dispense Refill  . amLODipine (NORVASC) 5 MG tablet Take 5 mg by mouth daily.      . brimonidine-timolol (COMBIGAN) 0.2-0.5 % ophthalmic solution Place 1 drop into the right eye every 12 (twelve) hours.      . clopidogrel (PLAVIX) 75 MG tablet Take 75 mg by mouth daily.    . cycloSPORINE (RESTASIS) 0.05 % ophthalmic emulsion Place 1 drop into both eyes 2 (two) times daily.    Marland Kitchen erlotinib (TARCEVA) 150 MG tablet Take 1 tablet (150 mg total) by mouth daily. Take on an empty stomach 1 hour before meals or 2 hours after. 30 tablet 3  . escitalopram (LEXAPRO) 10 MG tablet Take 10 mg by mouth daily.     . famotidine (PEPCID) 40 MG tablet Take 40 mg by mouth daily.    Marland Kitchen FeFum-FePoly-FA-B Cmp-C-Biot (FOLIVANE-PLUS) CAPS TAKE 1 CAPSULE BY MOUTH DAILY 90 capsule 1  . letrozole (FEMARA) 2.5 MG tablet TAKE ONE TABLET EACH DAY 30 tablet 2  . lisinopril (PRINIVIL,ZESTRIL) 20 MG tablet Take 20 mg by mouth daily.     Marland Kitchen zolpidem (AMBIEN) 5 MG tablet Take 5 mg by mouth at bedtime as needed for sleep. Reported on 04/25/2016    . oxyCODONE (OXY IR/ROXICODONE) 5 MG immediate release tablet Take 1-2 tablets (5-10 mg total) by mouth every 6 (six) hours as needed for moderate pain or severe pain. (Patient not taking: Reported on 10/22/2016) 30 tablet 0   No current facility-administered medications for this visit.     Physical Exam BP (!) 167/79 (BP Location: Right Arm,  Patient Position: Sitting, Cuff Size: Normal)   Pulse 69   Resp 16   Ht '5\' 8"'$  (1.727 m)   Wt 135 lb (61.2 kg)   SpO2 99% Comment: ON RA  BMI 20.58 kg/m  76 year old woman in no acute distress Appears well Incision well-healed  Diagnostic Tests: none  Impression: 76 year old woman who is now 2 weeks out from mediastinoscopy. She does not have any issues related to the surgery. She has no discomfort and her incision is healing well.  Dr. Julien Nordmann will treat her metastatic cancer.  Hypertension- her blood pressure was markedly elevated today. She is on medication for that. That is followed by Dr. Eber Hong. I recommended that she follow up with him to see if additional medication or a dosage change is needed.  Plan: Follow-up with Dr. Julien Nordmann  regarding metastatic cancer  Follow-up with Dr. Eber Hong regarding blood pressure  I will be happy to assist in anyway if I can be of assistance with her care.  [name], MD [facility] [phone]

## 2016-10-24 ENCOUNTER — Encounter: Payer: Self-pay | Admitting: *Deleted

## 2016-10-24 ENCOUNTER — Telehealth: Payer: Self-pay | Admitting: Medical Oncology

## 2016-10-24 NOTE — Telephone Encounter (Signed)
Cancelled appt tomorrow due to results not available. Pt notified. Schedule request sent for r/s next week.

## 2016-10-24 NOTE — Progress Notes (Signed)
Oncology Nurse Navigator Documentation  Oncology Nurse Navigator Flowsheets 10/24/2016  Navigator Location [facility]  Navigator Encounter Type Other/per Dr. Worthy Flank request, I called [facility] to follow up on results.  I was told results are still pending.  They will follow up and call me back with more information.  I updated Dr. Julien Nordmann.   Treatment Phase Pre-Tx/Tx Discussion  Barriers/Navigation Needs Coordination of Care  Interventions Coordination of Care  Coordination of Care Appts  Acuity Level 2  Acuity Level 2 Other  Time Spent with Patient 65

## 2016-10-25 ENCOUNTER — Other Ambulatory Visit: Payer: Self-pay

## 2016-10-25 ENCOUNTER — Ambulatory Visit: Payer: Self-pay | Admitting: Internal Medicine

## 2016-10-25 ENCOUNTER — Encounter: Payer: Self-pay | Admitting: *Deleted

## 2016-10-25 NOTE — Progress Notes (Signed)
Oncology Nurse Navigator Documentation  Oncology Nurse Navigator Flowsheets 10/25/2016  Navigator Location [facility]  Navigator Encounter Type Other/I called [facility] to get test results.  I was told sequencing has been completed and they will fax the results to [phone]  Treatment Phase Other  Barriers/Navigation Needs Coordination of Care  Interventions Coordination of Care  Coordination of Care Other  Acuity Level 2  Acuity Level 2 Other  Time Spent with Patient 30

## 2016-10-28 ENCOUNTER — Encounter: Payer: Self-pay | Admitting: Internal Medicine

## 2016-10-28 ENCOUNTER — Telehealth: Payer: Self-pay | Admitting: Medical Oncology

## 2016-10-28 ENCOUNTER — Other Ambulatory Visit: Payer: Self-pay | Admitting: Medical Oncology

## 2016-10-28 ENCOUNTER — Ambulatory Visit (HOSPITAL_BASED_OUTPATIENT_CLINIC_OR_DEPARTMENT_OTHER): Payer: Medicare Other | Admitting: Internal Medicine

## 2016-10-28 ENCOUNTER — Ambulatory Visit (HOSPITAL_BASED_OUTPATIENT_CLINIC_OR_DEPARTMENT_OTHER): Payer: Medicare Other

## 2016-10-28 ENCOUNTER — Telehealth: Payer: Self-pay | Admitting: Internal Medicine

## 2016-10-28 ENCOUNTER — Encounter (HOSPITAL_COMMUNITY): Payer: Self-pay

## 2016-10-28 VITALS — BP 184/63 | HR 95 | Temp 97.7°F | Resp 16 | Ht 68.0 in | Wt 134.1 lb

## 2016-10-28 DIAGNOSIS — Z170 Estrogen receptor positive status [ER+]: Secondary | ICD-10-CM

## 2016-10-28 DIAGNOSIS — C50112 Malignant neoplasm of central portion of left female breast: Secondary | ICD-10-CM

## 2016-10-28 DIAGNOSIS — C3412 Malignant neoplasm of upper lobe, left bronchus or lung: Secondary | ICD-10-CM

## 2016-10-28 DIAGNOSIS — I10 Essential (primary) hypertension: Secondary | ICD-10-CM

## 2016-10-28 DIAGNOSIS — C182 Malignant neoplasm of ascending colon: Secondary | ICD-10-CM

## 2016-10-28 DIAGNOSIS — D638 Anemia in other chronic diseases classified elsewhere: Secondary | ICD-10-CM

## 2016-10-28 DIAGNOSIS — Z5111 Encounter for antineoplastic chemotherapy: Secondary | ICD-10-CM

## 2016-10-28 DIAGNOSIS — Z9049 Acquired absence of other specified parts of digestive tract: Secondary | ICD-10-CM

## 2016-10-28 DIAGNOSIS — C50111 Malignant neoplasm of central portion of right female breast: Secondary | ICD-10-CM

## 2016-10-28 HISTORY — DX: Encounter for antineoplastic chemotherapy: Z51.11

## 2016-10-28 LAB — COMPREHENSIVE METABOLIC PANEL
ALBUMIN: 3.5 g/dL (ref 3.5–5.0)
ALK PHOS: 131 U/L (ref 40–150)
ALT: 12 U/L (ref 0–55)
ANION GAP: 6 meq/L (ref 3–11)
AST: 17 U/L (ref 5–34)
BILIRUBIN TOTAL: 0.46 mg/dL (ref 0.20–1.20)
BUN: 16.7 mg/dL (ref 7.0–26.0)
CALCIUM: 10.2 mg/dL (ref 8.4–10.4)
CO2: 25 mEq/L (ref 22–29)
CREATININE: 0.9 mg/dL (ref 0.6–1.1)
Chloride: 107 mEq/L (ref 98–109)
EGFR: 61 mL/min/{1.73_m2} — ABNORMAL LOW (ref >90)
Glucose: 100 mg/dl (ref 70–140)
Potassium: 4.3 mEq/L (ref 3.5–5.1)
Sodium: 139 mEq/L (ref 136–145)
TOTAL PROTEIN: 7 g/dL (ref 6.4–8.3)

## 2016-10-28 LAB — CBC WITH DIFFERENTIAL/PLATELET
BASO%: 0.5 % (ref 0.0–2.0)
Basophils Absolute: 0 10*3/uL (ref 0.0–0.1)
EOS%: 2.5 % (ref 0.0–7.0)
Eosinophils Absolute: 0.2 10*3/uL (ref 0.0–0.5)
HEMATOCRIT: 36.4 % (ref 34.8–46.6)
HGB: 11.6 g/dL (ref 11.6–15.9)
LYMPH#: 0.9 10*3/uL (ref 0.9–3.3)
LYMPH%: 15.1 % (ref 14.0–49.7)
MCH: 27.8 pg (ref 25.1–34.0)
MCHC: 31.9 g/dL (ref 31.5–36.0)
MCV: 87.1 fL (ref 79.5–101.0)
MONO#: 1 10*3/uL — AB (ref 0.1–0.9)
MONO%: 16.3 % — ABNORMAL HIGH (ref 0.0–14.0)
NEUT#: 3.9 10*3/uL (ref 1.5–6.5)
NEUT%: 65.6 % (ref 38.4–76.8)
PLATELETS: 416 10*3/uL — AB (ref 145–400)
RBC: 4.18 10*6/uL (ref 3.70–5.45)
RDW: 14.1 % (ref 11.2–14.5)
WBC: 6 10*3/uL (ref 3.9–10.3)

## 2016-10-28 LAB — CEA (IN HOUSE-CHCC): CEA (CHCC-IN HOUSE): 357.52 ng/mL — AB (ref 0.00–5.00)

## 2016-10-28 MED ORDER — OSIMERTINIB MESYLATE 80 MG PO TABS: 80.0000 mg | ORAL_TABLET | Freq: Every day | ORAL | 2 refills | Status: DC

## 2016-10-28 NOTE — Telephone Encounter (Signed)
appt today -pt aware

## 2016-10-28 NOTE — Telephone Encounter (Signed)
Gave patient avs report and appointments for January  °

## 2016-10-28 NOTE — Progress Notes (Signed)
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 2) adenocarcinoma of the ascending colon (T2, N0, M0) with MSI high diagnosed in March 2017. 3) history of breast adenocarcinoma.  PRIOR THERAPY:  1) Tarceva 150 mg by mouth daily status post 68 months of treatment. 2) Status post right colectomy with lymph node dissection in May 2017.  CURRENT THERAPY: 1) Tagrisso 80 mg by mouth daily. Expected to start in the next few days. 2) observation for the history of colon adenocarcinoma. 3) Femara 2.5 mg by mouth daily for history of breast cancer.  INTERVAL HISTORY: Kristin [name] 76 y.o. female returns to the clinic today for follow-up visit accompanied by her husband and her daughter. The patient is feeling fine today with no specific complaints except for mild fatigue. She has been on treatment with Tarceva for 68 months and has been tolerating her treatment well but unfortunately she was found to have evidence for disease progression with new mediastinal lymph nodes and rising CEA. She underwent repeat mediastinoscopy with biopsy of the enlarging mediastinal lymph node and the final pathology was consistent with metastatic adenocarcinoma. The molecular studies performed by [facility] showed the presence of T790M resistant mutation. The patient is here today for evaluation and discussion of her treatment options based on the new findings. She denied having any chest pain, shortness of breath, cough or hemoptysis. She has no weight loss or night sweats. She denied having any fever or chills. She has no significant nausea or vomiting.  MEDICAL HISTORY: Past Medical History:    Diagnosis Date  . Anemia   . Anemia of chronic disease 09/21/2016  . Anxiety   . Arthritis    arthritis- left hip  . Blood transfusion without reported diagnosis    transfusion- 3-4 yrs ago -found to be anemic on routine lab check  . Breast cancer ([place]) 2002   bilateral- bilateral mastectomies done.  . 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 CVA (cerebral infarction) 04/30/2013  . Family history of brain cancer   . Family history of breast cancer   . Family history of colon cancer 10/06/2009  . Family history of colon cancer   . Family history of prostate cancer   . GERD (gastroesophageal reflux disease)   . Glaucoma   . History of bilateral mastectomy 05/20/2010  . Hypertension   . Lung cancer ([place]) dx'd 12/2010   last Ct scan "no lung cancer" showing 11'16 CT Chest Epic.  . Malignant neoplasm of ascending colon ([place]) 01/29/2016  . Stroke [facility])    2 yrs ago-no residual  . Tubular adenoma of colon 07/2011   colon polyps ans reoccurence with malignancy found    ALLERGIES:  is allergic to doxycycline and sulfa antibiotics.  MEDICATIONS:  Current Outpatient Prescriptions  Medication Sig Dispense Refill  . amLODipine (NORVASC) 5 MG tablet Take 5 mg by mouth daily.      . brimonidine-timolol (COMBIGAN) 0.2-0.5 % ophthalmic solution Place 1 drop into the right eye every 12 (twelve) hours.    . clopidogrel (PLAVIX) 75 MG tablet Take 75 mg by mouth daily.    . cycloSPORINE (RESTASIS) 0.05 % ophthalmic emulsion Place 1 drop into both eyes 2 (two) times daily.    Marland Kitchen  erlotinib (TARCEVA) 150 MG tablet Take 1 tablet (150 mg total) by mouth daily. Take on an empty stomach 1 hour before meals or 2 hours after. 30 tablet 3  . escitalopram (LEXAPRO) 10 MG tablet Take 10 mg by mouth daily.     . famotidine (PEPCID) 40 MG tablet Take 40 mg by mouth daily.    Marland Kitchen FeFum-FePoly-FA-B Cmp-C-Biot (FOLIVANE-PLUS) CAPS TAKE 1 CAPSULE BY MOUTH DAILY 90 capsule 1  . letrozole (FEMARA) 2.5 MG tablet TAKE ONE TABLET  EACH DAY 30 tablet 2  . lisinopril (PRINIVIL,ZESTRIL) 20 MG tablet Take 20 mg by mouth daily.     Marland Kitchen oxyCODONE (OXY IR/ROXICODONE) 5 MG immediate release tablet Take 1-2 tablets (5-10 mg total) by mouth every 6 (six) hours as needed for moderate pain or severe pain. (Patient not taking: Reported on 10/22/2016) 30 tablet 0  . zolpidem (AMBIEN) 5 MG tablet Take 5 mg by mouth at bedtime as needed for sleep. Reported on 04/25/2016     No current facility-administered medications for this visit.     SURGICAL HISTORY:  Past Surgical History:  Procedure Laterality Date  . APPENDECTOMY    . cataracts Bilateral   . COLON SURGERY  02/2016  . COLONOSCOPY    . EYE SURGERY    . LUNG SURGERY     Resection -" not done. Pt . tx with Tarceva  . MASTECTOMY Bilateral   . MEDIASTINOSCOPY N/A 10/02/2016   Procedure: MEDIASTINOSCOPY;  Surgeon: [name], MD;  Location: [facility] OR;  Service: Thoracic;  Laterality: N/A;  . RECONSTRUCTION BREAST W/ TRAM FLAP Bilateral    bilaterally  . TEE WITHOUT CARDIOVERSION N/A 06/04/2013   Procedure: TRANSESOPHAGEAL ECHOCARDIOGRAM (TEE);  Surgeon: Jolaine Artist, MD;  Location[facility] ENDOSCOPY;  Service: Cardiovascular;  Laterality: N/A;  . TONSILLECTOMY    . TOTAL HIP ARTHROPLASTY Left 08/07/2016   Procedure: LEFT TOTAL HIP ARTHROPLASTY ANTERIOR APPROACH;  Surgeon: Gaynelle Arabian, MD;  Location: [facility] ORS;  Service: Orthopedics;  Laterality: Left;    REVIEW OF SYSTEMS:  Constitutional: positive for fatigue Eyes: negative Ears, nose, mouth, throat, and face: negative Respiratory: negative Cardiovascular: negative Gastrointestinal: negative Genitourinary:negative Integument/breast: negative Hematologic/lymphatic: negative Musculoskeletal:negative Neurological: negative Behavioral/Psych: negative Endocrine: negative Allergic/Immunologic: negative   PHYSICAL EXAMINATION: General appearance: alert, cooperative, fatigued and no distress Head: Normocephalic, without  obvious abnormality, atraumatic Neck: no adenopathy, no JVD, supple, symmetrical, trachea midline and thyroid not enlarged, symmetric, no tenderness/mass/nodules Lymph nodes: Cervical, supraclavicular, and axillary nodes normal. Resp: clear to auscultation bilaterally Back: symmetric, no curvature. ROM normal. No CVA tenderness. Cardio: regular rate and rhythm, S1, S2 normal, no murmur, click, rub or gallop GI: normal findings: bowel sounds normal and liver span normal to percussion Extremities: extremities normal, atraumatic, no cyanosis or edema Neurologic: Alert and oriented X 3, normal strength and tone. Normal symmetric reflexes. Normal coordination and gait  ECOG PERFORMANCE STATUS: 1 - Symptomatic but completely ambulatory  Blood pressure (!) 184/63, pulse 95, temperature 97.7 F (36.5 C), temperature source Oral, resp. rate 16, height _0  (1.727 m), weight 134 lb 1.6 oz (60.8 kg), SpO2 100 %.  LABORATORY DATA: Lab Results  Component Value Date   WBC 6.0 10/28/2016   HGB 11.6 10/28/2016   HCT 36.4 10/28/2016   MCV 87.1 10/28/2016   PLT 416 (H) 10/28/2016      Chemistry      Component Value Date/Time   NA 140 10/11/2016 0845   K 4.2 10/11/2016 0845   CL 105  09/30/2016 1100   CL 107 04/05/2013 0754   CO2 25 10/11/2016 0845   BUN 18.5 10/11/2016 0845   CREATININE 0.8 10/11/2016 0845      Component Value Date/Time   CALCIUM 9.4 10/11/2016 0845   ALKPHOS 127 10/11/2016 0845   AST 17 10/11/2016 0845   ALT 12 10/11/2016 0845   BILITOT 0.66 10/11/2016 0845       RADIOGRAPHIC STUDIES: No results found.  ASSESSMENT AND PLAN: This is a very pleasant 76 years old white female with multiple medical problems including: 1) metastatic non-small cell lung cancer, adenocarcinoma diagnosed in March 2012 with positive EGFR mutation status post 69 months of treatment with oral Tarceva but unfortunately the patient has evidence for disease progression and the recent biopsy of the  mediastinal lymph nodes showed the presence of T790M resistant mutation. I had a lengthy discussion with the patient and her family today about her current condition and treatment options for the T790M resistant mutation mutation lung cancer. I discussed with the patient and her family the goals of care. I recommended for her treatment with Tagrisso 80 mg by mouth daily. I discussed with the patient adverse effect of this treatment including but not limited to skin rash, diarrhea, interstitial lung disease, liver or renal dysfunction as well as cardiac dysfunction. I will send her prescription to [facility] outpatient pharmacy. She will discontinue her current treatment with Tarceva at this point. I ordered EKG today as a baseline for close monitoring of any QT prolongation. She will come back for follow-up visit in 2 weeks for reevaluation with repeat blood work. 2) hypertension: She was very anxious today. Her blood pressure is not well controlled. I recommended for the patient to take her blood pressure medication as prescribed and to discuss with her primary care physician and adjustment of her medications. 3) history of breast cancer: I recommended for the patient to continue her current treatment with Femara. 4) history of colon cancer status post right colectomy. She will continue on observation for now. The patient was advised to call immediately if she has any concerning symptoms in the interval. The patient voices understanding of current disease status and treatment options and is in agreement with the current care plan.  All questions were answered. The patient knows to call the clinic with any problems, questions or concerns. We can certainly see the patient much sooner if necessary.  Disclaimer: This note was dictated with voice recognition software. Similar sounding words can inadvertently be transcribed and may not be corrected upon review.

## 2016-10-29 ENCOUNTER — Telehealth: Payer: Self-pay

## 2016-10-29 NOTE — Telephone Encounter (Signed)
RX was sent to [facility], received notice prescription required a prior authorization.  PA initiated via CoverMyMeds to [facility] Part D  Received approval for Tagrisso from 10/29/16 thru 04/28/2017.  Informed [facility] to rerun prescription to determine patient copay information.  Awaiting copay information and will follow-up 10/30/16.  Thank you  Henreitta Leber, PharmD Oral Oncology Navigation Clinic

## 2016-10-30 ENCOUNTER — Telehealth: Payer: Self-pay | Admitting: Pharmacist

## 2016-10-30 NOTE — Telephone Encounter (Signed)
Oral Chemotherapy Pharmacist Encounter  Received email notification from CoverMyMeds that a new request has been submitted for our office to complete. The request was an enrollment form for Access 360 manufacturer support from [employer]. Form completed online.  Please note, I do not think our office initiated this request, but wanted to complete application to ge patient access issues addressed quickly.  This encounter will continue to be updated until final determination.  Oral Oncology Clinic will continue to follow.   Johny Drilling, PharmD, BCPS, BCOP 10/30/2016  10:53 AM Oral Oncology Clinic [phone]

## 2016-11-01 NOTE — Telephone Encounter (Signed)
Oral Chemotherapy Pharmacist Encounter  I spoke with patient yesterday (1/11) about Tagrisso copay of $3107.22 There are currently no foundation copayment grants available for NSCLC  Patient has been assessed by [employer] but we will have to apply for the AZ&me program We will complete AZ&me application and patient will come by [facility] today (6/27) to sign application and bring proof of income documentation.  Once application is completed we will fax to [facility] for Tagrisso at [phone]  This encounter will continue to be updated until final determination.  Oral Oncology Clinic will continue to follow.   Johny Drilling, PharmD, BCPS, BCOP 11/01/2016  8:47 AM Oral Oncology Clinic [phone]

## 2016-11-08 ENCOUNTER — Other Ambulatory Visit: Payer: Self-pay | Admitting: Internal Medicine

## 2016-11-08 ENCOUNTER — Telehealth: Payer: Self-pay

## 2016-11-08 ENCOUNTER — Telehealth: Payer: Self-pay | Admitting: Medical Oncology

## 2016-11-08 NOTE — Telephone Encounter (Signed)
Pt approved for tagrisso for 1 year.

## 2016-11-08 NOTE — Telephone Encounter (Signed)
Patient called to check on the status of application.  I returned her call to give her the update on assistance application.  She informed me she received a fill of Tagrisso 80 mg today and was very excited.  Thank you, Henreitta Leber, PharmD Oral Oncology Navigation Clinic

## 2016-11-11 ENCOUNTER — Ambulatory Visit (INDEPENDENT_AMBULATORY_CARE_PROVIDER_SITE_OTHER): Payer: Medicare Other | Admitting: Ophthalmology

## 2016-11-11 ENCOUNTER — Telehealth: Payer: Self-pay | Admitting: Medical Oncology

## 2016-11-11 NOTE — Telephone Encounter (Signed)
Oral Chemotherapy Pharmacist Encounter  Received notification from Abelina Bachelor, collaborative practice RN with Dr. Julien Nordmann that they had received fax from [facility] that patient had been successfully enrolled into their program to receive Tagrisso from the manufacturer free-of-charge for the remainder of 2018. Diane has placed the approval fax to be scanned into patient's chart.  Patient already aware.  Oral Oncology will sign off at this time. Please let us know if we can be of assistance in the future.  Johny Drilling, PharmD, BCPS, BCOP 11/11/2016  2:49 PM Oral Oncology Clinic [phone]

## 2016-11-11 NOTE — Telephone Encounter (Signed)
tagrisso started sat . I moved appts to 2 weeks.

## 2016-11-12 ENCOUNTER — Other Ambulatory Visit: Payer: Self-pay

## 2016-11-12 ENCOUNTER — Ambulatory Visit: Payer: Self-pay | Admitting: Internal Medicine

## 2016-11-18 ENCOUNTER — Telehealth: Payer: Self-pay | Admitting: Medical Oncology

## 2016-11-18 ENCOUNTER — Ambulatory Visit: Payer: Self-pay | Admitting: Internal Medicine

## 2016-11-18 NOTE — Telephone Encounter (Signed)
Received fax that medication has been shipped to pt.

## 2016-11-25 ENCOUNTER — Encounter: Payer: Self-pay | Admitting: Gastroenterology

## 2016-11-28 ENCOUNTER — Other Ambulatory Visit: Payer: Medicare Other

## 2016-11-28 ENCOUNTER — Telehealth: Payer: Self-pay | Admitting: Internal Medicine

## 2016-11-28 ENCOUNTER — Telehealth: Payer: Self-pay | Admitting: Pharmacist

## 2016-11-28 ENCOUNTER — Ambulatory Visit (HOSPITAL_BASED_OUTPATIENT_CLINIC_OR_DEPARTMENT_OTHER): Payer: Medicare Other | Admitting: Internal Medicine

## 2016-11-28 ENCOUNTER — Encounter: Payer: Self-pay | Admitting: Internal Medicine

## 2016-11-28 VITALS — BP 177/51 | HR 60 | Temp 97.7°F | Resp 18 | Ht 68.0 in | Wt 137.6 lb

## 2016-11-28 DIAGNOSIS — Z5111 Encounter for antineoplastic chemotherapy: Secondary | ICD-10-CM

## 2016-11-28 DIAGNOSIS — C3412 Malignant neoplasm of upper lobe, left bronchus or lung: Secondary | ICD-10-CM

## 2016-11-28 DIAGNOSIS — C779 Secondary and unspecified malignant neoplasm of lymph node, unspecified: Secondary | ICD-10-CM

## 2016-11-28 DIAGNOSIS — D638 Anemia in other chronic diseases classified elsewhere: Secondary | ICD-10-CM

## 2016-11-28 DIAGNOSIS — Z853 Personal history of malignant neoplasm of breast: Secondary | ICD-10-CM | POA: Diagnosis not present

## 2016-11-28 DIAGNOSIS — C50112 Malignant neoplasm of central portion of left female breast: Secondary | ICD-10-CM

## 2016-11-28 DIAGNOSIS — I10 Essential (primary) hypertension: Secondary | ICD-10-CM

## 2016-11-28 DIAGNOSIS — C50111 Malignant neoplasm of central portion of right female breast: Secondary | ICD-10-CM

## 2016-11-28 DIAGNOSIS — C182 Malignant neoplasm of ascending colon: Secondary | ICD-10-CM

## 2016-11-28 DIAGNOSIS — Z170 Estrogen receptor positive status [ER+]: Secondary | ICD-10-CM

## 2016-11-28 LAB — CBC WITH DIFFERENTIAL/PLATELET
BASO%: 0.9 % (ref 0.0–2.0)
Basophils Absolute: 0 10*3/uL (ref 0.0–0.1)
EOS%: 4.5 % (ref 0.0–7.0)
Eosinophils Absolute: 0.2 10*3/uL (ref 0.0–0.5)
HEMATOCRIT: 35.1 % (ref 34.8–46.6)
HEMOGLOBIN: 10.7 g/dL — AB (ref 11.6–15.9)
LYMPH#: 1 10*3/uL (ref 0.9–3.3)
LYMPH%: 20.9 % (ref 14.0–49.7)
MCH: 27 pg (ref 25.1–34.0)
MCHC: 30.5 g/dL — AB (ref 31.5–36.0)
MCV: 88.4 fL (ref 79.5–101.0)
MONO#: 0.7 10*3/uL (ref 0.1–0.9)
MONO%: 14 % (ref 0.0–14.0)
NEUT%: 59.7 % (ref 38.4–76.8)
NEUTROS ABS: 2.8 10*3/uL (ref 1.5–6.5)
Platelets: 298 10*3/uL (ref 145–400)
RBC: 3.97 10*6/uL (ref 3.70–5.45)
RDW: 15 % — AB (ref 11.2–14.5)
WBC: 4.7 10*3/uL (ref 3.9–10.3)

## 2016-11-28 LAB — COMPREHENSIVE METABOLIC PANEL
ALBUMIN: 3.5 g/dL (ref 3.5–5.0)
ALT: 15 U/L (ref 0–55)
AST: 18 U/L (ref 5–34)
Alkaline Phosphatase: 109 U/L (ref 40–150)
Anion Gap: 8 mEq/L (ref 3–11)
BILIRUBIN TOTAL: 0.36 mg/dL (ref 0.20–1.20)
BUN: 16.8 mg/dL (ref 7.0–26.0)
CALCIUM: 9.7 mg/dL (ref 8.4–10.4)
CHLORIDE: 108 meq/L (ref 98–109)
CO2: 24 mEq/L (ref 22–29)
CREATININE: 0.9 mg/dL (ref 0.6–1.1)
EGFR: 59 mL/min/{1.73_m2} — ABNORMAL LOW (ref >90)
Glucose: 86 mg/dl (ref 70–140)
Potassium: 4.5 mEq/L (ref 3.5–5.1)
Sodium: 140 mEq/L (ref 136–145)
TOTAL PROTEIN: 6.8 g/dL (ref 6.4–8.3)

## 2016-11-28 NOTE — Progress Notes (Signed)
[facility] [phone]   Fax:(336) [phone]  OFFICE PROGRESS NOTE  Eber Hong, MD [address] 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 2) adenocarcinoma of the ascending colon (T2, N0, M0) with MSI high diagnosed in March 2017. 3) history of breast adenocarcinoma.  PRIOR THERAPY:  1) Tarceva 150 mg by mouth daily status post 68 months of treatment. 2) Status post right colectomy with lymph node dissection in May 2017.  CURRENT THERAPY: 1) Tagrisso 80 mg by mouth daily started 11/09/2016. 2) observation for the history of colon adenocarcinoma. 3) Femara 2.5 mg by mouth daily for history of breast cancer.  INTERVAL HISTORY: Kristin [name] 76 y.o. female came to the clinic today for follow-up visit. The patient recently started treatment with Tagrisso 80 mg by mouth daily on 11/09/2016. She is tolerating this treatment well with no significant adverse effects. She denied having any significant skin rash or diarrhea. She denied having any chest pain, shortness of breath, cough or hemoptysis. She has no nausea, vomiting or constipation. She denied having any significant weight loss or night sweats. She has no fever or chills. She is here today for evaluation and repeat blood work for close monitoring of her treatment.  MEDICAL HISTORY: Past Medical History:  Diagnosis Date  . Anemia   . Anemia of chronic disease 09/21/2016  . Anxiety   . Arthritis    arthritis- left hip  . Blood transfusion without reported diagnosis    transfusion- 3-4 yrs ago -found to be anemic on routine lab check  . Breast cancer ([place]) 2002   bilateral- bilateral mastectomies done.  . 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 CVA (cerebral infarction) 04/30/2013  . Encounter for  antineoplastic chemotherapy 10/28/2016  . Family history of brain cancer   . Family history of breast cancer   . Family history of colon cancer 10/06/2009  . Family history of colon cancer   . Family history of prostate cancer   . GERD (gastroesophageal reflux disease)   . Glaucoma   . History of bilateral mastectomy 05/20/2010  . Hypertension   . Lung cancer ([place]) dx'd 12/2010   last Ct scan "no lung cancer" showing 11'16 CT Chest Epic.  . Malignant neoplasm of ascending colon ([place]) 01/29/2016  . Stroke [facility])    2 yrs ago-no residual  . Tubular adenoma of colon 07/2011   colon polyps ans reoccurence with malignancy found    ALLERGIES:  is allergic to doxycycline and sulfa antibiotics.  MEDICATIONS:  Current Outpatient Prescriptions  Medication Sig Dispense Refill  . amLODipine (NORVASC) 5 MG tablet Take 5 mg by mouth daily.      . brimonidine-timolol (COMBIGAN) 0.2-0.5 % ophthalmic solution Place 1 drop into the right eye every 12 (twelve) hours.    . clopidogrel (PLAVIX) 75 MG tablet Take 75 mg by mouth daily.    . cycloSPORINE (RESTASIS) 0.05 % ophthalmic emulsion Place 1 drop into both eyes 2 (two) times daily.    Marland Kitchen escitalopram (LEXAPRO) 10 MG tablet Take 10 mg by mouth daily.     . famotidine (PEPCID) 40 MG tablet Take 40 mg by mouth daily.    Marland Kitchen FeFum-FePoly-FA-B Cmp-C-Biot (FOLIVANE-PLUS) CAPS TAKE 1 CAPSULE BY MOUTH DAILY 90 capsule 1  . letrozole (FEMARA) 2.5 MG tablet TAKE ONE TABLET BY MOUTH  EACH DAY 30 tablet 2  . lisinopril (PRINIVIL,ZESTRIL) 20 MG tablet Take 20 mg by mouth daily.     Marland Kitchen osimertinib mesylate (TAGRISSO) 80 MG tablet Take 1 tablet (80 mg total) by mouth daily. 30 tablet 2  . oxyCODONE (OXY IR/ROXICODONE) 5 MG immediate release tablet Take 1-2 tablets (5-10 mg total) by mouth every 6 (six) hours as needed for moderate pain or severe pain. (Patient not taking: Reported on 10/22/2016) 30 tablet 0  . zolpidem (AMBIEN) 5 MG tablet Take 5 mg by mouth at bedtime as  needed for sleep. Reported on 04/25/2016     No current facility-administered medications for this visit.     SURGICAL HISTORY:  Past Surgical History:  Procedure Laterality Date  . APPENDECTOMY    . cataracts Bilateral   . COLON SURGERY  02/2016  . COLONOSCOPY    . EYE SURGERY    . LUNG SURGERY     Resection -" not done. Pt . tx with Tarceva  . MASTECTOMY Bilateral   . MEDIASTINOSCOPY N/A 10/02/2016   Procedure: MEDIASTINOSCOPY;  Surgeon: [name], MD;  Location: [facility] OR;  Service: Thoracic;  Laterality: N/A;  . RECONSTRUCTION BREAST W/ TRAM FLAP Bilateral    bilaterally  . TEE WITHOUT CARDIOVERSION N/A 06/04/2013   Procedure: TRANSESOPHAGEAL ECHOCARDIOGRAM (TEE);  Surgeon: Jolaine Artist, MD;  Location: [facility] ENDOSCOPY;  Service: Cardiovascular;  Laterality: N/A;  . TONSILLECTOMY    . TOTAL HIP ARTHROPLASTY Left 08/07/2016   Procedure: LEFT TOTAL HIP ARTHROPLASTY ANTERIOR APPROACH;  Surgeon: Gaynelle Arabian, MD;  Location: [facility] ORS;  Service: Orthopedics;  Laterality: Left;    REVIEW OF SYSTEMS:  Constitutional: negative Eyes: negative Ears, nose, mouth, throat, and face: negative Respiratory: negative Cardiovascular: negative Gastrointestinal: negative Genitourinary:negative Integument/breast: negative Hematologic/lymphatic: negative Musculoskeletal:negative Neurological: negative Behavioral/Psych: negative Endocrine: negative Allergic/Immunologic: negative   PHYSICAL EXAMINATION: General appearance: alert, cooperative and no distress Head: Normocephalic, without obvious abnormality, atraumatic Neck: no adenopathy, no JVD, supple, symmetrical, trachea midline and thyroid not enlarged, symmetric, no tenderness/mass/nodules Lymph nodes: Cervical, supraclavicular, and axillary nodes normal. Resp: clear to auscultation bilaterally Back: symmetric, no curvature. ROM normal. No CVA tenderness. Cardio: regular rate and rhythm, S1, S2 normal, no murmur, click, rub or  gallop GI: soft, non-tender; bowel sounds normal; no masses,  no organomegaly Extremities: extremities normal, atraumatic, no cyanosis or edema Neurologic: Alert and oriented X 3, normal strength and tone. Normal symmetric reflexes. Normal coordination and gait  ECOG PERFORMANCE STATUS: 1 - Symptomatic but completely ambulatory  There were no vitals taken for this visit.  LABORATORY DATA: Lab Results  Component Value Date   WBC 4.7 11/28/2016   HGB 10.7 (L) 11/28/2016   HCT 35.1 11/28/2016   MCV 88.4 11/28/2016   PLT 298 11/28/2016      Chemistry      Component Value Date/Time   NA 139 10/28/2016 1321   K 4.3 10/28/2016 1321   CL 105 09/30/2016 1100   CL 107 04/05/2013 0754   CO2 25 10/28/2016 1321   BUN 16.7 10/28/2016 1321   CREATININE 0.9 10/28/2016 1321      Component Value Date/Time   CALCIUM 10.2 10/28/2016 1321   ALKPHOS 131 10/28/2016 1321   AST 17 10/28/2016 1321   ALT 12 10/28/2016 1321   BILITOT 0.46 10/28/2016 1321       RADIOGRAPHIC STUDIES: No results found.  ASSESSMENT AND PLAN:  This is a very pleasant 76 years old white female with multiple medical conditions:  1) metastatic non-small cell lung cancer, adenocarcinoma that was initially diagnosed in March 2012 with positive EGFR mutation status post 68 months of treatment with Tarceva. Unfortunately the patient developed disease progression and biopsy of the progressive mediastinal lymph nodes showed the presence of T790M resistant mutation. The patient was started on treatment with Tagrisso 80 mg by mouth daily 2 weeks ago. She is tolerating this treatment well with no significant adverse effects. I recommended for the patient to continue her current treatment with Tagrisso at the same dose. She will come back for follow-up visit in 2 weeks for reevaluation and repeat blood work. 2) history of ColonCancer status post right colectomy. She is currently on observation. 3) history of breast cancer: She  will continue treatment with Femara. 4) hypertension: I strongly recommended for the patient to monitor her blood pressure closed at home and to report to her primary care physician for adjustment of her medication. 5) anemia of chronic disease: We will continue to monitor her hemoglobin and hematocrit for now. The patient was advised to call immediately if she has any concerning symptoms in the interval. The patient voices understanding of current disease status and treatment options and is in agreement with the current care plan.  All questions were answered. The patient knows to call the clinic with any problems, questions or concerns. We can certainly see the patient much sooner if necessary.  Disclaimer: This note was dictated with voice recognition software. Similar sounding words can inadvertently be transcribed and may not be corrected upon review.

## 2016-11-28 NOTE — Telephone Encounter (Signed)
Appointments scheduled per 11/28/16 los. Patient was given a copy of the AVS report and appointment schedule, per 11/28/16 los.

## 2016-11-28 NOTE — Telephone Encounter (Signed)
Oral Chemotherapy Pharmacist Encounter  Received call from AZ&me confirming patient remains on current Tagrisso therapy. Per office note from Dr. Julien Nordmann today (11/28/16), patient to continue to current therapy of Tagrisso '80mg'$  by mouth daily. While on the phone I initiated the patient's next fill.  Johny Drilling, PharmD, BCPS, BCOP 11/28/2016  4:16 PM Oral Oncology Clinic [phone]

## 2016-12-16 ENCOUNTER — Ambulatory Visit (INDEPENDENT_AMBULATORY_CARE_PROVIDER_SITE_OTHER): Payer: Medicare Other | Admitting: Ophthalmology

## 2016-12-16 ENCOUNTER — Ambulatory Visit (HOSPITAL_BASED_OUTPATIENT_CLINIC_OR_DEPARTMENT_OTHER): Payer: Medicare Other | Admitting: Internal Medicine

## 2016-12-16 ENCOUNTER — Other Ambulatory Visit (HOSPITAL_BASED_OUTPATIENT_CLINIC_OR_DEPARTMENT_OTHER): Payer: Medicare Other

## 2016-12-16 ENCOUNTER — Encounter: Payer: Self-pay | Admitting: Internal Medicine

## 2016-12-16 ENCOUNTER — Telehealth: Payer: Self-pay | Admitting: Internal Medicine

## 2016-12-16 VITALS — BP 159/57 | HR 82 | Temp 98.6°F | Resp 18 | Ht 68.0 in | Wt 138.8 lb

## 2016-12-16 DIAGNOSIS — H43813 Vitreous degeneration, bilateral: Secondary | ICD-10-CM | POA: Diagnosis not present

## 2016-12-16 DIAGNOSIS — I10 Essential (primary) hypertension: Secondary | ICD-10-CM | POA: Diagnosis not present

## 2016-12-16 DIAGNOSIS — Z170 Estrogen receptor positive status [ER+]: Secondary | ICD-10-CM

## 2016-12-16 DIAGNOSIS — D638 Anemia in other chronic diseases classified elsewhere: Secondary | ICD-10-CM

## 2016-12-16 DIAGNOSIS — H35033 Hypertensive retinopathy, bilateral: Secondary | ICD-10-CM

## 2016-12-16 DIAGNOSIS — C3412 Malignant neoplasm of upper lobe, left bronchus or lung: Secondary | ICD-10-CM

## 2016-12-16 DIAGNOSIS — Z8509 Personal history of malignant neoplasm of other digestive organs: Secondary | ICD-10-CM | POA: Diagnosis not present

## 2016-12-16 DIAGNOSIS — H35372 Puckering of macula, left eye: Secondary | ICD-10-CM | POA: Diagnosis not present

## 2016-12-16 DIAGNOSIS — D3132 Benign neoplasm of left choroid: Secondary | ICD-10-CM | POA: Diagnosis not present

## 2016-12-16 DIAGNOSIS — C182 Malignant neoplasm of ascending colon: Secondary | ICD-10-CM

## 2016-12-16 DIAGNOSIS — Z5111 Encounter for antineoplastic chemotherapy: Secondary | ICD-10-CM

## 2016-12-16 DIAGNOSIS — C50112 Malignant neoplasm of central portion of left female breast: Secondary | ICD-10-CM

## 2016-12-16 DIAGNOSIS — C50111 Malignant neoplasm of central portion of right female breast: Secondary | ICD-10-CM

## 2016-12-16 DIAGNOSIS — H353111 Nonexudative age-related macular degeneration, right eye, early dry stage: Secondary | ICD-10-CM | POA: Diagnosis not present

## 2016-12-16 DIAGNOSIS — Z853 Personal history of malignant neoplasm of breast: Secondary | ICD-10-CM

## 2016-12-16 LAB — COMPREHENSIVE METABOLIC PANEL
ALT: 16 U/L (ref 0–55)
ANION GAP: 7 meq/L (ref 3–11)
AST: 21 U/L (ref 5–34)
Albumin: 3.7 g/dL (ref 3.5–5.0)
Alkaline Phosphatase: 91 U/L (ref 40–150)
BUN: 17 mg/dL (ref 7.0–26.0)
CALCIUM: 10.1 mg/dL (ref 8.4–10.4)
CHLORIDE: 108 meq/L (ref 98–109)
CO2: 24 meq/L (ref 22–29)
CREATININE: 1.1 mg/dL (ref 0.6–1.1)
EGFR: 50 mL/min/{1.73_m2} — ABNORMAL LOW (ref >90)
Glucose: 95 mg/dl (ref 70–140)
POTASSIUM: 4.5 meq/L (ref 3.5–5.1)
Sodium: 139 mEq/L (ref 136–145)
Total Bilirubin: 0.45 mg/dL (ref 0.20–1.20)
Total Protein: 6.8 g/dL (ref 6.4–8.3)

## 2016-12-16 LAB — CBC WITH DIFFERENTIAL/PLATELET
BASO%: 0.9 % (ref 0.0–2.0)
BASOS ABS: 0 10*3/uL (ref 0.0–0.1)
EOS%: 3.9 % (ref 0.0–7.0)
Eosinophils Absolute: 0.2 10*3/uL (ref 0.0–0.5)
HEMATOCRIT: 32.2 % — AB (ref 34.8–46.6)
HGB: 10.6 g/dL — ABNORMAL LOW (ref 11.6–15.9)
LYMPH%: 18.7 % (ref 14.0–49.7)
MCH: 28.2 pg (ref 25.1–34.0)
MCHC: 33 g/dL (ref 31.5–36.0)
MCV: 85.4 fL (ref 79.5–101.0)
MONO#: 0.6 10*3/uL (ref 0.1–0.9)
MONO%: 15.9 % — ABNORMAL HIGH (ref 0.0–14.0)
NEUT#: 2.4 10*3/uL (ref 1.5–6.5)
NEUT%: 60.6 % (ref 38.4–76.8)
Platelets: 286 10*3/uL (ref 145–400)
RBC: 3.76 10*6/uL (ref 3.70–5.45)
RDW: 16.1 % — ABNORMAL HIGH (ref 11.2–14.5)
WBC: 4 10*3/uL (ref 3.9–10.3)
lymph#: 0.7 10*3/uL — ABNORMAL LOW (ref 0.9–3.3)

## 2016-12-16 NOTE — Progress Notes (Signed)
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 2) adenocarcinoma of the ascending colon (T2, N0, M0) with MSI high diagnosed in March 2017. 3) history of breast adenocarcinoma.  PRIOR THERAPY:  1) Tarceva 150 mg by mouth daily status post 68 months of treatment. 2) Status post right colectomy with lymph node dissection in May 2017.  CURRENT THERAPY: 1) Tagrisso 80 mg by mouth daily started 11/09/2016. 2) observation for the history of colon adenocarcinoma. 3) Femara 2.5 mg by mouth daily for history of breast cancer.  INTERVAL HISTORY: Kristin [name] 76 y.o. female to the clinic today for follow-up visit. The patient is feeling fine with no specific complaints except for fatigue. She is currently on treatment with Tagrisso 80 mg by mouth daily status post 5 weeks of treatment and tolerating it fairly well. She has occasional diarrhea but no significant skin rash. She denied having any significant weight loss or night sweats. She has no chest pain, shortness of breath, cough or hemoptysis. The patient denied having any fever or chills. She has no nausea, vomiting, diarrhea or constipation. She is here today for evaluation and repeat blood work.   MEDICAL HISTORY: Past Medical History:  Diagnosis Date  . Anemia   . Anemia of chronic disease 09/21/2016  . Anxiety   . Arthritis    arthritis- left hip  . Blood transfusion without reported diagnosis    transfusion- 3-4 yrs ago -found to be anemic on routine lab check  . Breast cancer ([place]) 2002   bilateral- bilateral mastectomies done.  . 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 CVA (cerebral infarction) 04/30/2013  .  Encounter for antineoplastic chemotherapy 10/28/2016  . Family history of brain cancer   . Family history of breast cancer   . Family history of colon cancer 10/06/2009  . Family history of colon cancer   . Family history of prostate cancer   . GERD (gastroesophageal reflux disease)   . Glaucoma   . History of bilateral mastectomy 05/20/2010  . Hypertension   . Lung cancer ([place]) dx'd 12/2010   last Ct scan "no lung cancer" showing 11'16 CT Chest Epic.  . Malignant neoplasm of ascending colon ([place]) 01/29/2016  . Stroke [facility]    2 yrs ago-no residual  . Tubular adenoma of colon 07/2011   colon polyps ans reoccurence with malignancy found    ALLERGIES:  is allergic to doxycycline and sulfa antibiotics.  MEDICATIONS:  Current Outpatient Prescriptions  Medication Sig Dispense Refill  . amLODipine (NORVASC) 5 MG tablet Take 5 mg by mouth daily.      . brimonidine-timolol (COMBIGAN) 0.2-0.5 % ophthalmic solution Place 1 drop into the right eye every 12 (twelve) hours.    . clopidogrel (PLAVIX) 75 MG tablet Take 75 mg by mouth daily.    . cycloSPORINE (RESTASIS) 0.05 % ophthalmic emulsion Place 1 drop into both eyes 2 (two) times daily.    Marland Kitchen escitalopram (LEXAPRO) 10 MG tablet Take 10 mg by mouth daily.     . famotidine (PEPCID) 40 MG tablet Take 40 mg by mouth daily.    Marland Kitchen FeFum-FePoly-FA-B Cmp-C-Biot (FOLIVANE-PLUS) CAPS TAKE 1 CAPSULE BY MOUTH DAILY 90 capsule 1  . letrozole (FEMARA) 2.5 MG  tablet TAKE ONE TABLET BY MOUTH EACH DAY 30 tablet 2  . lisinopril (PRINIVIL,ZESTRIL) 20 MG tablet Take 1 tablet by mouth 2 (two) times daily.    Marland Kitchen osimertinib mesylate (TAGRISSO) 80 MG tablet Take 1 tablet (80 mg total) by mouth daily. 30 tablet 2  . zolpidem (AMBIEN) 5 MG tablet Take 5 mg by mouth at bedtime as needed for sleep. Reported on 04/25/2016     No current facility-administered medications for this visit.     SURGICAL HISTORY:  Past Surgical History:  Procedure Laterality Date  .  APPENDECTOMY    . cataracts Bilateral   . COLON SURGERY  02/2016  . COLONOSCOPY    . EYE SURGERY    . LUNG SURGERY     Resection -" not done. Pt . tx with Tarceva  . MASTECTOMY Bilateral   . MEDIASTINOSCOPY N/A 10/02/2016   Procedure: MEDIASTINOSCOPY;  Surgeon: [name], MD;  Location: [facility] OR;  Service: Thoracic;  Laterality: N/A;  . RECONSTRUCTION BREAST W/ TRAM FLAP Bilateral    bilaterally  . TEE WITHOUT CARDIOVERSION N/A 06/04/2013   Procedure: TRANSESOPHAGEAL ECHOCARDIOGRAM (TEE);  Surgeon: Jolaine Artist, MD;  Location: [facility] ENDOSCOPY;  Service: Cardiovascular;  Laterality: N/A;  . TONSILLECTOMY    . TOTAL HIP ARTHROPLASTY Left 08/07/2016   Procedure: LEFT TOTAL HIP ARTHROPLASTY ANTERIOR APPROACH;  Surgeon: Gaynelle Arabian, MD;  Location: [facility] ORS;  Service: Orthopedics;  Laterality: Left;    REVIEW OF SYSTEMS:  Constitutional: positive for fatigue Eyes: negative Ears, nose, mouth, throat, and face: negative Respiratory: negative Cardiovascular: negative Gastrointestinal: negative Genitourinary:negative Integument/breast: negative Hematologic/lymphatic: negative Musculoskeletal:negative Neurological: negative Behavioral/Psych: negative Endocrine: negative Allergic/Immunologic: negative   PHYSICAL EXAMINATION: General appearance: alert, cooperative, fatigued and no distress Head: Normocephalic, without obvious abnormality, atraumatic Neck: no adenopathy, no JVD, supple, symmetrical, trachea midline and thyroid not enlarged, symmetric, no tenderness/mass/nodules Lymph nodes: Cervical, supraclavicular, and axillary nodes normal. Resp: clear to auscultation bilaterally Back: symmetric, no curvature. ROM normal. No CVA tenderness. Cardio: regular rate and rhythm, S1, S2 normal, no murmur, click, rub or gallop GI: soft, non-tender; bowel sounds normal; no masses,  no organomegaly Extremities: extremities normal, atraumatic, no cyanosis or edema Neurologic: Alert and  oriented X 3, normal strength and tone. Normal symmetric reflexes. Normal coordination and gait  ECOG PERFORMANCE STATUS: 1 - Symptomatic but completely ambulatory  Blood pressure (!) 159/57, pulse 82, temperature 98.6 F (37 C), temperature source Oral, resp. rate 18, height _0  (1.727 m), weight 138 lb 12.8 oz (63 kg), SpO2 100 %.  LABORATORY DATA: Lab Results  Component Value Date   WBC 4.0 12/16/2016   HGB 10.6 (L) 12/16/2016   HCT 32.2 (L) 12/16/2016   MCV 85.4 12/16/2016   PLT 286 12/16/2016      Chemistry      Component Value Date/Time   NA 139 12/16/2016 1045   K 4.5 12/16/2016 1045   CL 105 09/30/2016 1100   CL 107 04/05/2013 0754   CO2 24 12/16/2016 1045   BUN 17.0 12/16/2016 1045   CREATININE 1.1 12/16/2016 1045      Component Value Date/Time   CALCIUM 10.1 12/16/2016 1045   ALKPHOS 91 12/16/2016 1045   AST 21 12/16/2016 1045   ALT 16 12/16/2016 1045   BILITOT 0.45 12/16/2016 1045       RADIOGRAPHIC STUDIES: No results found.  ASSESSMENT AND PLAN:  This is a very pleasant 76 years old white female with: 1) metastatic non-small cell lung cancer,  adenocarcinoma initially diagnosed in March 2012 with positive EGFR mutation status post 29 months treatment with Tarceva discontinued secondary to disease progression and development of T790M resistant mutation. The patient is currently on treatment with Tagrisso 80 mg by mouth daily status post 5 weeks. She is tolerating this treatment much better with no significant adverse effects except for mild diarrhea. I recommended for her to continue her current treatment with Tagrisso with the same dose. I would see her back for follow-up visit in one month's for evaluation after repeating CT scan of the chest, abdomen and pelvis for restaging of her disease. 2) colon adenocarcinoma: Status post right colectomy. She is currently on observation. I recommended for the patient to proceed with repeat colonoscopy as recommended  by her gastroenterologist. 3) breast cancer: She will continue her current treatment with Femara. 4) hypertension: I recommended for the patient to continue her treatment with lisinopril and Norvasc and to monitor her blood pressure closed at home. She was also advised to report to her primary care physician no improvement in her blood pressure. The patient was advised to call immediately if she has any concerning symptoms in the interval. The patient voices understanding of current disease status and treatment options and is in agreement with the current care plan.  All questions were answered. The patient knows to call the clinic with any problems, questions or concerns. We can certainly see the patient much sooner if necessary.  Disclaimer: This note was dictated with voice recognition software. Similar sounding words can inadvertently be transcribed and may not be corrected upon review.

## 2016-12-16 NOTE — Telephone Encounter (Signed)
Appointments scheduled per 2/26 LOS. Patient given AVS report and calendars with future scheduled appointments. Patient also given two bottles of contrast and instructions for CT scan appointments.

## 2017-01-10 ENCOUNTER — Other Ambulatory Visit (HOSPITAL_BASED_OUTPATIENT_CLINIC_OR_DEPARTMENT_OTHER): Payer: Medicare Other

## 2017-01-10 ENCOUNTER — Ambulatory Visit (HOSPITAL_COMMUNITY)
Admission: RE | Admit: 2017-01-10 | Discharge: 2017-01-10 | Disposition: A | Discharge status: Home / Self Care | Payer: Medicare Other | Source: Ambulatory Visit | Attending: Internal Medicine | Admitting: Internal Medicine

## 2017-01-10 DIAGNOSIS — D638 Anemia in other chronic diseases classified elsewhere: Secondary | ICD-10-CM | POA: Diagnosis not present

## 2017-01-10 DIAGNOSIS — I10 Essential (primary) hypertension: Secondary | ICD-10-CM

## 2017-01-10 DIAGNOSIS — Z5111 Encounter for antineoplastic chemotherapy: Secondary | ICD-10-CM

## 2017-01-10 DIAGNOSIS — Z170 Estrogen receptor positive status [ER+]: Secondary | ICD-10-CM | POA: Insufficient documentation

## 2017-01-10 DIAGNOSIS — K449 Diaphragmatic hernia without obstruction or gangrene: Secondary | ICD-10-CM | POA: Insufficient documentation

## 2017-01-10 DIAGNOSIS — I700 Atherosclerosis of aorta: Secondary | ICD-10-CM | POA: Diagnosis not present

## 2017-01-10 DIAGNOSIS — C3412 Malignant neoplasm of upper lobe, left bronchus or lung: Secondary | ICD-10-CM | POA: Insufficient documentation

## 2017-01-10 DIAGNOSIS — C182 Malignant neoplasm of ascending colon: Secondary | ICD-10-CM

## 2017-01-10 DIAGNOSIS — C50112 Malignant neoplasm of central portion of left female breast: Secondary | ICD-10-CM | POA: Insufficient documentation

## 2017-01-10 DIAGNOSIS — K8689 Other specified diseases of pancreas: Secondary | ICD-10-CM | POA: Diagnosis not present

## 2017-01-10 DIAGNOSIS — D259 Leiomyoma of uterus, unspecified: Secondary | ICD-10-CM | POA: Diagnosis not present

## 2017-01-10 DIAGNOSIS — C50111 Malignant neoplasm of central portion of right female breast: Secondary | ICD-10-CM | POA: Diagnosis not present

## 2017-01-10 LAB — COMPREHENSIVE METABOLIC PANEL WITH GFR
ALT: 20 U/L (ref 0–55)
AST: 21 U/L (ref 5–34)
Albumin: 3.7 g/dL (ref 3.5–5.0)
Alkaline Phosphatase: 86 U/L (ref 40–150)
Anion Gap: 6 meq/L (ref 3–11)
BUN: 20.4 mg/dL (ref 7.0–26.0)
CO2: 26 meq/L (ref 22–29)
Calcium: 10 mg/dL (ref 8.4–10.4)
Chloride: 106 meq/L (ref 98–109)
Creatinine: 1 mg/dL (ref 0.6–1.1)
EGFR: 52 ml/min/1.73 m2 — ABNORMAL LOW
Glucose: 96 mg/dL (ref 70–140)
Potassium: 5 meq/L (ref 3.5–5.1)
Sodium: 138 meq/L (ref 136–145)
Total Bilirubin: 0.36 mg/dL (ref 0.20–1.20)
Total Protein: 7 g/dL (ref 6.4–8.3)

## 2017-01-10 LAB — CBC WITH DIFFERENTIAL/PLATELET
BASO%: 1 % (ref 0.0–2.0)
Basophils Absolute: 0 10e3/uL (ref 0.0–0.1)
EOS%: 3.5 % (ref 0.0–7.0)
Eosinophils Absolute: 0.1 10e3/uL (ref 0.0–0.5)
HCT: 34.2 % — ABNORMAL LOW (ref 34.8–46.6)
HGB: 11.2 g/dL — ABNORMAL LOW (ref 11.6–15.9)
LYMPH%: 22.6 % (ref 14.0–49.7)
MCH: 28.6 pg (ref 25.1–34.0)
MCHC: 32.8 g/dL (ref 31.5–36.0)
MCV: 87.1 fL (ref 79.5–101.0)
MONO#: 0.7 10e3/uL (ref 0.1–0.9)
MONO%: 15.9 % — ABNORMAL HIGH (ref 0.0–14.0)
NEUT#: 2.4 10e3/uL (ref 1.5–6.5)
NEUT%: 57 % (ref 38.4–76.8)
Platelets: 267 10e3/uL (ref 145–400)
RBC: 3.92 10e6/uL (ref 3.70–5.45)
RDW: 17.1 % — ABNORMAL HIGH (ref 11.2–14.5)
WBC: 4.1 10e3/uL (ref 3.9–10.3)
lymph#: 0.9 10e3/uL (ref 0.9–3.3)

## 2017-01-10 MED ORDER — IOPAMIDOL (ISOVUE-300) INJECTION 61%
INTRAVENOUS | Status: AC
  Filled 2017-01-10: qty 100

## 2017-01-10 MED ORDER — IOPAMIDOL (ISOVUE-300) INJECTION 61%
100.0000 mL | Freq: Once | INTRAVENOUS | Status: AC | PRN
  Administered 2017-01-10: 100 mL via INTRAVENOUS

## 2017-01-16 ENCOUNTER — Encounter: Payer: Self-pay | Admitting: Internal Medicine

## 2017-01-16 ENCOUNTER — Ambulatory Visit (HOSPITAL_BASED_OUTPATIENT_CLINIC_OR_DEPARTMENT_OTHER): Payer: Medicare Other | Admitting: Internal Medicine

## 2017-01-16 ENCOUNTER — Telehealth: Payer: Self-pay | Admitting: Internal Medicine

## 2017-01-16 VITALS — BP 160/51 | HR 55 | Temp 97.7°F | Resp 18 | Wt 140.6 lb

## 2017-01-16 DIAGNOSIS — I10 Essential (primary) hypertension: Secondary | ICD-10-CM | POA: Diagnosis not present

## 2017-01-16 DIAGNOSIS — C50112 Malignant neoplasm of central portion of left female breast: Secondary | ICD-10-CM

## 2017-01-16 DIAGNOSIS — C182 Malignant neoplasm of ascending colon: Secondary | ICD-10-CM

## 2017-01-16 DIAGNOSIS — Z85038 Personal history of other malignant neoplasm of large intestine: Secondary | ICD-10-CM

## 2017-01-16 DIAGNOSIS — Z5111 Encounter for antineoplastic chemotherapy: Secondary | ICD-10-CM

## 2017-01-16 DIAGNOSIS — C3412 Malignant neoplasm of upper lobe, left bronchus or lung: Secondary | ICD-10-CM

## 2017-01-16 DIAGNOSIS — Z79811 Long term (current) use of aromatase inhibitors: Secondary | ICD-10-CM | POA: Diagnosis not present

## 2017-01-16 DIAGNOSIS — C50111 Malignant neoplasm of central portion of right female breast: Secondary | ICD-10-CM | POA: Diagnosis not present

## 2017-01-16 DIAGNOSIS — Z170 Estrogen receptor positive status [ER+]: Secondary | ICD-10-CM

## 2017-01-16 NOTE — Progress Notes (Signed)
[place] Telephone:([phone]) [phone]   Fax:([phone]) [phone]  OFFICE PROGRESS NOTE  Eber Hong, MD [address] [place] [address]  DIAGNOSIS:  1) metastatic non-small cell lung cancer, adenocarcinoma diagnosed in March 2012 with positive EGFR mutation in exon 21 (L858R). 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 2) adenocarcinoma of the ascending colon (T2, N0, M0) with MSI high diagnosed in March 2017. 3) history of breast adenocarcinoma.  PRIOR THERAPY:  1) Tarceva 150 mg by mouth daily status post 68 months of treatment. 2) Status post right colectomy with lymph node dissection in May 2017.  CURRENT THERAPY: 1) Tagrisso 80 mg by mouth daily started 11/09/2016. 2) observation for the history of colon adenocarcinoma. 3) Femara 2.5 mg by mouth daily for history of breast cancer.  INTERVAL HISTORY: Kristin [name] 76 y.o. female returns to the clinic today for follow-up visit accompanied by her daughter. The patient is feeling fine today with no specific complaints. She is tolerating her current treatment with Tagrisso fairly well with no significant adverse effects except for occasional episodes of diarrhea. She denied having any significant skin rash. She denied having any chest pain, shortness of breath, cough or hemoptysis. She has no fever or chills. She denied having any nausea, vomiting, diarrhea or constipation. The patient denied having any significant weight loss or night sweats. She had repeat CT scan of the chest, abdomen and pelvis performed recently and she is here for evaluation and discussion of her scan results.   MEDICAL HISTORY: Past Medical History:  Diagnosis Date  . Anemia   . Anemia of chronic disease 09/21/2016  . Anxiety   . Arthritis    arthritis- left hip  . Blood transfusion without reported diagnosis    transfusion- 3-4 yrs ago -found to be anemic on routine lab check  . Breast cancer ([place]) 2002   bilateral- bilateral mastectomies done.  . 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 CVA (cerebral infarction) 04/30/2013  . Encounter for antineoplastic chemotherapy 10/28/2016  . Family history of brain cancer   . Family history of breast cancer   . Family history of colon cancer 10/06/2009  . Family history of colon cancer   . Family history of prostate cancer   . GERD (gastroesophageal reflux disease)   . Glaucoma   . History of bilateral mastectomy 05/20/2010  . Hypertension   . Lung cancer ([place]) dx'd 12/2010   last Ct scan "no lung cancer" showing 11'16 CT Chest Epic.  . Malignant neoplasm of ascending colon ([place]) 01/29/2016  . Stroke [facility])    2 yrs ago-no residual  . Tubular adenoma of colon 07/2011   colon polyps ans reoccurence with malignancy found    ALLERGIES:  is allergic to doxycycline and sulfa antibiotics.  MEDICATIONS:  Current Outpatient Prescriptions  Medication Sig Dispense Refill  . amLODipine (NORVASC) 5 MG tablet Take 5 mg by mouth daily.      . brimonidine-timolol (COMBIGAN) 0.2-0.5 % ophthalmic solution Place 1 drop into the right eye every 12 (twelve) hours.    . clopidogrel (PLAVIX) 75 MG tablet Take 75 mg by mouth daily.    . cycloSPORINE (RESTASIS) 0.05 % ophthalmic emulsion Place 1 drop into both eyes 2 (two) times daily.    Marland Kitchen escitalopram (LEXAPRO) 10 MG tablet Take 10 mg by mouth daily.     . famotidine (PEPCID) 40 MG tablet Take 40 mg by mouth daily.    Marland Kitchen FeFum-FePoly-FA-B Cmp-C-Biot (FOLIVANE-PLUS) CAPS TAKE  1 CAPSULE BY MOUTH DAILY 90 capsule 1  . letrozole (FEMARA) 2.5 MG tablet TAKE ONE TABLET BY MOUTH EACH DAY 30 tablet 2  . lisinopril (PRINIVIL,ZESTRIL) 20 MG tablet Take 1 tablet by mouth 2 (two) times daily.    Marland Kitchen osimertinib mesylate (TAGRISSO) 80 MG tablet Take 1 tablet (80 mg total) by mouth daily. 30 tablet 2  . zolpidem (AMBIEN) 5 MG tablet Take 5 mg by mouth at bedtime as needed for sleep. Reported on 04/25/2016     No current facility-administered  medications for this visit.     SURGICAL HISTORY:  Past Surgical History:  Procedure Laterality Date  . APPENDECTOMY    . cataracts Bilateral   . COLON SURGERY  02/2016  . COLONOSCOPY    . EYE SURGERY    . LUNG SURGERY     Resection -" not done. Pt . tx with Tarceva  . MASTECTOMY Bilateral   . MEDIASTINOSCOPY N/A 10/02/2016   Procedure: MEDIASTINOSCOPY;  Surgeon: [name], MD;  Location: [facility];  Service: Thoracic;  Laterality: N/A;  . RECONSTRUCTION BREAST W/ TRAM FLAP Bilateral    bilaterally  . TEE WITHOUT CARDIOVERSION N/A 06/04/2013   Procedure: TRANSESOPHAGEAL ECHOCARDIOGRAM (TEE);  Surgeon: Jolaine Artist, MD;  Location: [facility] ENDOSCOPY;  Service: Cardiovascular;  Laterality: N/A;  . TONSILLECTOMY    . TOTAL HIP ARTHROPLASTY Left 08/07/2016   Procedure: LEFT TOTAL HIP ARTHROPLASTY ANTERIOR APPROACH;  Surgeon: Gaynelle Arabian, MD;  Location: [facility] ORS;  Service: Orthopedics;  Laterality: Left;    REVIEW OF SYSTEMS:  Constitutional: negative Eyes: negative Ears, nose, mouth, throat, and face: negative Respiratory: negative Cardiovascular: negative Gastrointestinal: positive for diarrhea Genitourinary:negative Integument/breast: negative Hematologic/lymphatic: negative Musculoskeletal:negative Neurological: negative Behavioral/Psych: negative Endocrine: negative Allergic/Immunologic: negative   PHYSICAL EXAMINATION: General appearance: alert, cooperative and no distress Head: Normocephalic, without obvious abnormality, atraumatic Neck: no adenopathy, no JVD, supple, symmetrical, trachea midline and thyroid not enlarged, symmetric, no tenderness/mass/nodules Lymph nodes: Cervical, supraclavicular, and axillary nodes normal. Resp: clear to auscultation bilaterally Back: symmetric, no curvature. ROM normal. No CVA tenderness. Cardio: regular rate and rhythm, S1, S2 normal, no murmur, click, rub or gallop GI: soft, non-tender; bowel sounds normal; no masses,  no  organomegaly Extremities: extremities normal, atraumatic, no cyanosis or edema Neurologic: Alert and oriented X 3, normal strength and tone. Normal symmetric reflexes. Normal coordination and gait  ECOG PERFORMANCE STATUS: 1 - Symptomatic but completely ambulatory  Blood pressure (!) 160/51, pulse (!) 55, temperature 97.7 F (36.5 C), temperature source Oral, resp. rate 18, weight 140 lb 9.6 oz (63.8 kg), SpO2 100 %.  LABORATORY DATA: Lab Results  Component Value Date   WBC 4.1 01/10/2017   HGB 11.2 (L) 01/10/2017   HCT 34.2 (L) 01/10/2017   MCV 87.1 01/10/2017   PLT 267 01/10/2017      Chemistry      Component Value Date/Time   NA 138 01/10/2017 1115   K 5.0 01/10/2017 1115   CL 105 09/30/2016 1100   CL 107 04/05/2013 0754   CO2 26 01/10/2017 1115   BUN 20.4 01/10/2017 1115   CREATININE 1.0 01/10/2017 1115      Component Value Date/Time   CALCIUM 10.0 01/10/2017 1115   ALKPHOS 86 01/10/2017 1115   AST 21 01/10/2017 1115   ALT 20 01/10/2017 1115   BILITOT 0.36 01/10/2017 1115       RADIOGRAPHIC STUDIES: Ct Chest W Contrast  Result Date: 01/10/2017 CLINICAL DATA:  Metastatic non-small cell lung cancer  diagnosed 2012 with adenocarcinoma of the ascending colon diagnosed in 2017 and history of breast adenocarcinoma. EXAM: CT CHEST, ABDOMEN, AND PELVIS WITH CONTRAST TECHNIQUE: Multidetector CT imaging of the chest, abdomen and pelvis was performed following the standard protocol during bolus administration of intravenous contrast. CONTRAST:  163m ISOVUE-300 IOPAMIDOL (ISOVUE-300) INJECTION 61% COMPARISON:  PET-CT 09/20/2016. Diagnostic CT imaging from 09/10/2016. FINDINGS: CT CHEST FINDINGS Cardiovascular: Heart size normal. No pericardial effusion. Coronary artery calcification is noted. Atherosclerotic calcification is noted in the wall of the thoracic aorta. Mediastinum/Nodes: 9 mm prevascular lymph node seen on the previous CT scan has decreased to 4 mm in the interval. 9  mm short axis precarinal lymph node has decreased to 5 mm. 8 mm short axis left hilar lymph node on today's study was 13 mm previously. Moderate hiatal hernia again noted. Esophagus unremarkable. Surgical clips noted right axilla. No axillary or subpectoral lymphadenopathy. Stable appearance bilateral thyroid nodules. Lungs/Pleura: Surgical scarring noted left upper lung. No suspicious pulmonary nodule or mass. No focal airspace consolidation. No pulmonary edema or pleural effusion. Musculoskeletal: Bone windows reveal no worrisome lytic or sclerotic osseous lesions. CT ABDOMEN PELVIS FINDINGS Hepatobiliary: 2.8 cm stable hepatic cyst. There is no evidence for gallstones, gallbladder wall thickening, or pericholecystic fluid. No intrahepatic or extrahepatic biliary dilation. Pancreas: Subtle 6 mm hypoattenuating pancreatic head lesion is stable. No dilatation of the main duct. No intraparenchymal cyst. No peripancreatic edema. Spleen: No splenomegaly. No focal mass lesion. Adrenals/Urinary Tract: 7 mm probable cyst interpolar right kidney is unchanged. Left kidney unremarkable. No evidence for hydroureter. The urinary bladder appears normal for the degree of distention. Stomach/Bowel: Moderate hiatal hernia. Stomach otherwise unremarkable. Duodenum is normally positioned as is the ligament of Treitz. No small bowel wall thickening. No small bowel dilatation. Patient is status post right hemicolectomy. Diverticular changes noted sigmoid colon without diverticulitis. Vascular/Lymphatic: There is abdominal aortic atherosclerosis without aneurysm. Portal vein is patent. There is no gastrohepatic or hepatoduodenal ligament lymphadenopathy. No intraperitoneal or retroperitoneal lymphadenopathy. No pelvic sidewall lymphadenopathy. Reproductive: Calcified fibroids noted in the uterus. There is no adnexal mass. Other: No intraperitoneal free fluid. Musculoskeletal: Patient is status post left total hip replacement. Bone  windows reveal no worrisome lytic or sclerotic osseous lesions. IMPRESSION: 1. Interval decrease in mediastinal lymphadenopathy with no pathologically enlarged mediastinal lymph nodes on today's study by CT criteria. 2. Moderate hiatal hernia. 3. Coronary artery and abdominal aortic atherosclerosis. 4. Stable tiny hypoattenuating pancreatic head lesion. 5. Fibroid uterus. Electronically Signed   By: EMisty StanleyM.D.   On: 01/10/2017 15:01   Ct Abdomen Pelvis W Contrast  Result Date: 01/10/2017 CLINICAL DATA:  Metastatic non-small cell lung cancer diagnosed 2012 with adenocarcinoma of the ascending colon diagnosed in 2017 and history of breast adenocarcinoma. EXAM: CT CHEST, ABDOMEN, AND PELVIS WITH CONTRAST TECHNIQUE: Multidetector CT imaging of the chest, abdomen and pelvis was performed following the standard protocol during bolus administration of intravenous contrast. CONTRAST:  1055mISOVUE-300 IOPAMIDOL (ISOVUE-300) INJECTION 61% COMPARISON:  PET-CT 09/20/2016. Diagnostic CT imaging from 09/10/2016. FINDINGS: CT CHEST FINDINGS Cardiovascular: Heart size normal. No pericardial effusion. Coronary artery calcification is noted. Atherosclerotic calcification is noted in the wall of the thoracic aorta. Mediastinum/Nodes: 9 mm prevascular lymph node seen on the previous CT scan has decreased to 4 mm in the interval. 9 mm short axis precarinal lymph node has decreased to 5 mm. 8 mm short axis left hilar lymph node on today's study was 13 mm previously. Moderate hiatal hernia again noted.  Esophagus unremarkable. Surgical clips noted right axilla. No axillary or subpectoral lymphadenopathy. Stable appearance bilateral thyroid nodules. Lungs/Pleura: Surgical scarring noted left upper lung. No suspicious pulmonary nodule or mass. No focal airspace consolidation. No pulmonary edema or pleural effusion. Musculoskeletal: Bone windows reveal no worrisome lytic or sclerotic osseous lesions. CT ABDOMEN PELVIS FINDINGS  Hepatobiliary: 2.8 cm stable hepatic cyst. There is no evidence for gallstones, gallbladder wall thickening, or pericholecystic fluid. No intrahepatic or extrahepatic biliary dilation. Pancreas: Subtle 6 mm hypoattenuating pancreatic head lesion is stable. No dilatation of the main duct. No intraparenchymal cyst. No peripancreatic edema. Spleen: No splenomegaly. No focal mass lesion. Adrenals/Urinary Tract: 7 mm probable cyst interpolar right kidney is unchanged. Left kidney unremarkable. No evidence for hydroureter. The urinary bladder appears normal for the degree of distention. Stomach/Bowel: Moderate hiatal hernia. Stomach otherwise unremarkable. Duodenum is normally positioned as is the ligament of Treitz. No small bowel wall thickening. No small bowel dilatation. Patient is status post right hemicolectomy. Diverticular changes noted sigmoid colon without diverticulitis. Vascular/Lymphatic: There is abdominal aortic atherosclerosis without aneurysm. Portal vein is patent. There is no gastrohepatic or hepatoduodenal ligament lymphadenopathy. No intraperitoneal or retroperitoneal lymphadenopathy. No pelvic sidewall lymphadenopathy. Reproductive: Calcified fibroids noted in the uterus. There is no adnexal mass. Other: No intraperitoneal free fluid. Musculoskeletal: Patient is status post left total hip replacement. Bone windows reveal no worrisome lytic or sclerotic osseous lesions. IMPRESSION: 1. Interval decrease in mediastinal lymphadenopathy with no pathologically enlarged mediastinal lymph nodes on today's study by CT criteria. 2. Moderate hiatal hernia. 3. Coronary artery and abdominal aortic atherosclerosis. 4. Stable tiny hypoattenuating pancreatic head lesion. 5. Fibroid uterus. Electronically Signed   By: Misty Stanley M.D.   On: 01/10/2017 15:01    ASSESSMENT AND PLAN:  This is a very pleasant 76 years old white female with metastatic non-small cell lung cancer, adenocarcinoma initially diagnosed in  March 2012 with positive EGFR mutation status post 68 months of treatment with Tarceva that was discontinued secondary to disease progression and development of T790M resistant mutation. She is currently on treatment with Tagrisso 80 mg by mouth daily started 11/09/2016 and she is tolerating it fairly well. The patient had repeat CT scan of the chest, abdomen and pelvis performed recently. I personally and independently reviewed the scan images and discuss the results with the patient and her daughter. Her scan showed improvement of her disease especially in the mediastinal lymph nodes. I recommended for the patient to continue her current treatment with Tagrisso. For the history of colon adenocarcinoma, status post right colectomy. She will continue on observation for now. For the history of breast cancer, the patient will continue her current treatment with Femara. For hypertension, she is currently on lisinopril and Norvasc. She would come back for follow-up visit in one month for evaluation and repeat blood work. She was advised to call immediately if she has any concerning symptoms in the interval. The patient voices understanding of current disease status and treatment options and is in agreement with the current care plan.  All questions were answered. The patient knows to call the clinic with any problems, questions or concerns. We can certainly see the patient much sooner if necessary.  Disclaimer: This note was dictated with voice recognition software. Similar sounding words can inadvertently be transcribed and may not be corrected upon review.

## 2017-01-16 NOTE — Telephone Encounter (Signed)
Gave patient avs report and appointments for May  °

## 2017-01-28 ENCOUNTER — Other Ambulatory Visit: Payer: Self-pay | Admitting: Medical Oncology

## 2017-01-28 DIAGNOSIS — C3412 Malignant neoplasm of upper lobe, left bronchus or lung: Secondary | ICD-10-CM

## 2017-01-28 DIAGNOSIS — D638 Anemia in other chronic diseases classified elsewhere: Secondary | ICD-10-CM

## 2017-01-28 DIAGNOSIS — C50111 Malignant neoplasm of central portion of right female breast: Secondary | ICD-10-CM

## 2017-01-28 DIAGNOSIS — C182 Malignant neoplasm of ascending colon: Secondary | ICD-10-CM

## 2017-01-28 DIAGNOSIS — Z5111 Encounter for antineoplastic chemotherapy: Secondary | ICD-10-CM

## 2017-01-28 DIAGNOSIS — Z170 Estrogen receptor positive status [ER+]: Secondary | ICD-10-CM

## 2017-01-28 DIAGNOSIS — C50112 Malignant neoplasm of central portion of left female breast: Secondary | ICD-10-CM

## 2017-01-28 MED ORDER — OSIMERTINIB MESYLATE 80 MG PO TABS: 80.0000 mg | ORAL_TABLET | Freq: Every day | ORAL | 2 refills | Status: DC

## 2017-01-28 NOTE — Progress Notes (Signed)
tagrisso faxed to [facility] and me

## 2017-02-17 ENCOUNTER — Other Ambulatory Visit: Payer: Self-pay | Admitting: Internal Medicine

## 2017-02-20 ENCOUNTER — Encounter: Payer: Self-pay | Admitting: Internal Medicine

## 2017-02-20 ENCOUNTER — Other Ambulatory Visit (HOSPITAL_BASED_OUTPATIENT_CLINIC_OR_DEPARTMENT_OTHER): Payer: Medicare Other

## 2017-02-20 ENCOUNTER — Ambulatory Visit (HOSPITAL_BASED_OUTPATIENT_CLINIC_OR_DEPARTMENT_OTHER): Payer: Medicare Other | Admitting: Internal Medicine

## 2017-02-20 ENCOUNTER — Telehealth: Payer: Self-pay | Admitting: Internal Medicine

## 2017-02-20 VITALS — BP 157/47 | HR 55 | Temp 97.7°F | Resp 24 | Ht 68.0 in | Wt 141.5 lb

## 2017-02-20 DIAGNOSIS — Z170 Estrogen receptor positive status [ER+]: Secondary | ICD-10-CM | POA: Diagnosis not present

## 2017-02-20 DIAGNOSIS — C50112 Malignant neoplasm of central portion of left female breast: Secondary | ICD-10-CM

## 2017-02-20 DIAGNOSIS — C50111 Malignant neoplasm of central portion of right female breast: Secondary | ICD-10-CM

## 2017-02-20 DIAGNOSIS — C3412 Malignant neoplasm of upper lobe, left bronchus or lung: Secondary | ICD-10-CM | POA: Diagnosis present

## 2017-02-20 DIAGNOSIS — C3492 Malignant neoplasm of unspecified part of left bronchus or lung: Secondary | ICD-10-CM

## 2017-02-20 DIAGNOSIS — Z5111 Encounter for antineoplastic chemotherapy: Secondary | ICD-10-CM

## 2017-02-20 DIAGNOSIS — C182 Malignant neoplasm of ascending colon: Secondary | ICD-10-CM

## 2017-02-20 LAB — CBC WITH DIFFERENTIAL/PLATELET
BASO%: 1.2 % (ref 0.0–2.0)
Basophils Absolute: 0.1 10*3/uL (ref 0.0–0.1)
EOS ABS: 0.2 10*3/uL (ref 0.0–0.5)
EOS%: 3.7 % (ref 0.0–7.0)
HCT: 34.3 % — ABNORMAL LOW (ref 34.8–46.6)
HGB: 11.1 g/dL — ABNORMAL LOW (ref 11.6–15.9)
LYMPH%: 27.3 % (ref 14.0–49.7)
MCH: 29 pg (ref 25.1–34.0)
MCHC: 32.4 g/dL (ref 31.5–36.0)
MCV: 89.6 fL (ref 79.5–101.0)
MONO#: 0.8 10*3/uL (ref 0.1–0.9)
MONO%: 19.4 % — AB (ref 0.0–14.0)
NEUT%: 48.4 % (ref 38.4–76.8)
NEUTROS ABS: 2 10*3/uL (ref 1.5–6.5)
PLATELETS: 219 10*3/uL (ref 145–400)
RBC: 3.83 10*6/uL (ref 3.70–5.45)
RDW: 14.4 % (ref 11.2–14.5)
WBC: 4 10*3/uL (ref 3.9–10.3)
lymph#: 1.1 10*3/uL (ref 0.9–3.3)

## 2017-02-20 LAB — COMPREHENSIVE METABOLIC PANEL
ALT: 15 U/L (ref 0–55)
ANION GAP: 7 meq/L (ref 3–11)
AST: 20 U/L (ref 5–34)
Albumin: 3.7 g/dL (ref 3.5–5.0)
Alkaline Phosphatase: 83 U/L (ref 40–150)
BUN: 18.3 mg/dL (ref 7.0–26.0)
CHLORIDE: 109 meq/L (ref 98–109)
CO2: 26 meq/L (ref 22–29)
Calcium: 10 mg/dL (ref 8.4–10.4)
Creatinine: 1.1 mg/dL (ref 0.6–1.1)
EGFR: 49 mL/min/{1.73_m2} — AB (ref >90)
GLUCOSE: 101 mg/dL (ref 70–140)
POTASSIUM: 4.7 meq/L (ref 3.5–5.1)
SODIUM: 142 meq/L (ref 136–145)
Total Bilirubin: 0.45 mg/dL (ref 0.20–1.20)
Total Protein: 6.9 g/dL (ref 6.4–8.3)

## 2017-02-20 NOTE — Progress Notes (Signed)
[place] Telephone:(336) [phone]   Fax:(336) [phone]  OFFICE PROGRESS NOTE  Eber Hong, MD [address] 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 2) adenocarcinoma of the ascending colon (T2, N0, M0) with MSI high diagnosed in March 2017. 3) history of breast adenocarcinoma.  PRIOR THERAPY:  1) Tarceva 150 mg by mouth daily status post 68 months of treatment. 2) Status post right colectomy with lymph node dissection in May 2017.  CURRENT THERAPY: 1) Tagrisso 80 mg by mouth daily started 11/09/2016. 2) observation for the history of colon adenocarcinoma. 3) Femara 2.5 mg by mouth daily for history of breast cancer.  INTERVAL HISTORY: Kristin [name] 76 y.o. female returns to the clinic today for follow-up visit. The patient is feeling fine today with no specific complaints. She had a recent infection of her toenails and she was seen by the foot doctor and was started on ampicillin. She denied having any chest pain, shortness of breath, cough or hemoptysis. She has no fever or chills. She has been tolerating her treatment with Tagrisso fairly well. She is here today for evaluation and repeat blood work.   MEDICAL HISTORY: Past Medical History:  Diagnosis Date  . Anemia   . Anemia of chronic disease 09/21/2016  . Anxiety   . Arthritis    arthritis- left hip  . Blood transfusion without reported diagnosis    transfusion- 3-4 yrs ago -found to be anemic on routine lab check  . Breast cancer ([place]) 2002   bilateral- bilateral mastectomies done.  . 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 CVA (cerebral infarction) 04/30/2013  . Encounter for antineoplastic chemotherapy 10/28/2016  . Family history of brain cancer   . Family history of breast cancer   . Family history of  colon cancer 10/06/2009  . Family history of colon cancer   . Family history of prostate cancer   . GERD (gastroesophageal reflux disease)   . Glaucoma   . History of bilateral mastectomy 05/20/2010  . Hypertension   . Lung cancer ([place]) dx'd 12/2010   last Ct scan "no lung cancer" showing 11'16 CT Chest Epic.  . Malignant neoplasm of ascending colon ([place]) 01/29/2016  . Stroke [facility])    2 yrs ago-no residual  . Tubular adenoma of colon 07/2011   colon polyps ans reoccurence with malignancy found    ALLERGIES:  is allergic to doxycycline and sulfa antibiotics.  MEDICATIONS:  Current Outpatient Prescriptions  Medication Sig Dispense Refill  . amLODipine (NORVASC) 5 MG tablet Take 5 mg by mouth daily.      . brimonidine-timolol (COMBIGAN) 0.2-0.5 % ophthalmic solution Place 1 drop into the right eye every 12 (twelve) hours.    . clopidogrel (PLAVIX) 75 MG tablet Take 75 mg by mouth daily.    . cycloSPORINE (RESTASIS) 0.05 % ophthalmic emulsion Place 1 drop into both eyes 2 (two) times daily.    Marland Kitchen escitalopram (LEXAPRO) 10 MG tablet Take 10 mg by mouth daily.     . famotidine (PEPCID) 40 MG tablet Take 40 mg by mouth daily.    Marland Kitchen FeFum-FePoly-FA-B Cmp-C-Biot (FOLIVANE-PLUS) CAPS TAKE 1 CAPSULE BY MOUTH DAILY 90 capsule 1  . letrozole (FEMARA) 2.5 MG tablet TAKE ONE TABLET BY MOUTH EACH DAY 30 tablet 1  . lisinopril (PRINIVIL,ZESTRIL) 20 MG tablet Take 1 tablet  by mouth 2 (two) times daily.    Marland Kitchen osimertinib mesylate (TAGRISSO) 80 MG tablet Take 1 tablet (80 mg total) by mouth daily. 30 tablet 2  . amoxicillin (AMOXIL) 250 MG capsule Take 250 mg by mouth 3 (three) times daily.    Marland Kitchen zolpidem (AMBIEN) 5 MG tablet Take 5 mg by mouth at bedtime as needed for sleep. Reported on 04/25/2016     No current facility-administered medications for this visit.     SURGICAL HISTORY:  Past Surgical History:  Procedure Laterality Date  . APPENDECTOMY    . cataracts Bilateral   . COLON SURGERY  02/2016    . COLONOSCOPY    . EYE SURGERY    . LUNG SURGERY     Resection -" not done. Pt . tx with Tarceva  . MASTECTOMY Bilateral   . MEDIASTINOSCOPY N/A 10/02/2016   Procedure: MEDIASTINOSCOPY;  Surgeon: [name], MD;  Location: [facility] OR;  Service: Thoracic;  Laterality: N/A;  . RECONSTRUCTION BREAST W/ TRAM FLAP Bilateral    bilaterally  . TEE WITHOUT CARDIOVERSION N/A 06/04/2013   Procedure: TRANSESOPHAGEAL ECHOCARDIOGRAM (TEE);  Surgeon: Jolaine Artist, MD;  Location: [facility] ENDOSCOPY;  Service: Cardiovascular;  Laterality: N/A;  . TONSILLECTOMY    . TOTAL HIP ARTHROPLASTY Left 08/07/2016   Procedure: LEFT TOTAL HIP ARTHROPLASTY ANTERIOR APPROACH;  Surgeon: Gaynelle Arabian, MD;  Location: [facility] ORS;  Service: Orthopedics;  Laterality: Left;    REVIEW OF SYSTEMS:  A comprehensive review of systems was negative.   PHYSICAL EXAMINATION: General appearance: alert, cooperative and no distress Head: Normocephalic, without obvious abnormality, atraumatic Neck: no adenopathy, no JVD, supple, symmetrical, trachea midline and thyroid not enlarged, symmetric, no tenderness/mass/nodules Lymph nodes: Cervical, supraclavicular, and axillary nodes normal. Resp: clear to auscultation bilaterally Back: symmetric, no curvature. ROM normal. No CVA tenderness. Cardio: regular rate and rhythm, S1, S2 normal, no murmur, click, rub or gallop GI: soft, non-tender; bowel sounds normal; no masses,  no organomegaly Extremities: extremities normal, atraumatic, no cyanosis or edema  ECOG PERFORMANCE STATUS: 1 - Symptomatic but completely ambulatory  Blood pressure (!) 157/47, pulse (!) 55, temperature 97.7 F (36.5 C), temperature source Oral, resp. rate (!) 24, height _0  (1.727 m), weight 141 lb 8 oz (64.2 kg), SpO2 100 %.  LABORATORY DATA: Lab Results  Component Value Date   WBC 4.0 02/20/2017   HGB 11.1 (L) 02/20/2017   HCT 34.3 (L) 02/20/2017   MCV 89.6 02/20/2017   PLT 219 02/20/2017       Chemistry      Component Value Date/Time   NA 142 02/20/2017 1002   K 4.7 02/20/2017 1002   CL 105 09/30/2016 1100   CL 107 04/05/2013 0754   CO2 26 02/20/2017 1002   BUN 18.3 02/20/2017 1002   CREATININE 1.1 02/20/2017 1002      Component Value Date/Time   CALCIUM 10.0 02/20/2017 1002   ALKPHOS 83 02/20/2017 1002   AST 20 02/20/2017 1002   ALT 15 02/20/2017 1002   BILITOT 0.45 02/20/2017 1002       RADIOGRAPHIC STUDIES: No results found.  ASSESSMENT AND PLAN:  This is a very pleasant 75 years old white female with metastatic non-small cell lung cancer, adenocarcinoma that was initially diagnosed in March 2012 with positive EGFR mutation status post 68 months treatment with Tarceva discontinued secondary to disease progression and development of T790M resistant mutation. The patient was started on treatment with Tagrisso 80 mg by mouth daily in January  2018 and has been tolerating this treatment much better. Her lab work today is unremarkable except for mild anemia. I recommended for the patient to continue her current treatment with Tagrisso. I will see her back for follow-up visit in one month for evaluation and repeat blood work. For the anemia, the patient will continue on oral iron tablets. She was advised to call immediately if she has any concerning symptoms in the interval. The patient voices understanding of current disease status and treatment options and is in agreement with the current care plan. All questions were answered. The patient knows to call the clinic with any problems, questions or concerns. We can certainly see the patient much sooner if necessary. I spent 10 minutes counseling the patient face to face. The total time spent in the appointment was 15 minutes. Disclaimer: This note was dictated with voice recognition software. Similar sounding words can inadvertently be transcribed and may not be corrected upon review.

## 2017-02-20 NOTE — Telephone Encounter (Signed)
Gave patient AVS and calender per 5/3 los.

## 2017-03-03 ENCOUNTER — Ambulatory Visit (INDEPENDENT_AMBULATORY_CARE_PROVIDER_SITE_OTHER): Payer: Medicare Other | Admitting: Gastroenterology

## 2017-03-03 ENCOUNTER — Encounter: Payer: Self-pay | Admitting: Gastroenterology

## 2017-03-03 ENCOUNTER — Telehealth: Payer: Self-pay

## 2017-03-03 VITALS — BP 106/50 | HR 72 | Ht 68.0 in | Wt 141.5 lb

## 2017-03-03 DIAGNOSIS — Z85038 Personal history of other malignant neoplasm of large intestine: Secondary | ICD-10-CM

## 2017-03-03 DIAGNOSIS — Z85118 Personal history of other malignant neoplasm of bronchus and lung: Secondary | ICD-10-CM | POA: Diagnosis not present

## 2017-03-03 DIAGNOSIS — Z7902 Long term (current) use of antithrombotics/antiplatelets: Secondary | ICD-10-CM | POA: Diagnosis not present

## 2017-03-03 MED ORDER — NA SULFATE-K SULFATE-MG SULF 17.5-3.13-1.6 GM/177ML PO SOLN: 1.0000 | Freq: Once | ORAL | 0 refills | Status: AC

## 2017-03-03 NOTE — Progress Notes (Signed)
HPI :  76 y/o female with a history of CVA on plavix, history of breast cancer, history of lung cancer on Tagrisso, history of colon cancer last year, here for a follow up visit.   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 Johney Maine for which she recovered from and had been doing well. She reports she has some chronic loose stools, she thinks from her medications (Tagrisso). She has 2 BMs per day for the most part, usually loose. No blood in the stools although she is on iron supplementation. No abdominal pains.   She does have lung cancer but on her regimen last staging CT looked good. She is very functional, had a hip replacement last October.  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 Anemia   . Anemia of chronic disease 09/21/2016  . Anxiety   . Arthritis    arthritis- left hip  . Blood transfusion without reported diagnosis    transfusion- 3-4 yrs ago -found to be anemic on routine lab check  . Breast cancer ([place]) 2002   bilateral- bilateral mastectomies done.  . 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 CVA (cerebral infarction) 04/30/2013  . Encounter for antineoplastic chemotherapy 10/28/2016  . Family history of brain cancer   . Family history of breast cancer   . Family history of colon cancer 10/06/2009  . Family history of colon cancer   . Family history of prostate cancer   . GERD (gastroesophageal reflux disease)   . Glaucoma   . History of bilateral mastectomy 05/20/2010  . Hypertension   . Lung cancer ([place]) dx'd 12/2010   last Ct scan "no lung cancer" showing 11'16 CT Chest Epic.  . Malignant neoplasm of ascending colon ([place]) 01/29/2016  . Stroke [facility])    2 yrs ago-no  residual  . Tubular adenoma of colon 07/2011   colon polyps ans reoccurence with malignancy found     Past Surgical History:  Procedure Laterality Date  . APPENDECTOMY    . cataracts Bilateral   . COLON SURGERY  02/2016  . COLONOSCOPY    . EYE SURGERY    . LUNG SURGERY     Resection -" not done. Pt . tx with Tarceva  . MASTECTOMY Bilateral   . MEDIASTINOSCOPY N/A 10/02/2016   Procedure: MEDIASTINOSCOPY;  Surgeon: [name], MD;  Location: [facility] OR;  Service: Thoracic;  Laterality: N/A;  . RECONSTRUCTION BREAST W/ TRAM FLAP Bilateral    bilaterally  . TEE WITHOUT CARDIOVERSION N/A 06/04/2013   Procedure: TRANSESOPHAGEAL ECHOCARDIOGRAM (TEE);  Surgeon: Jolaine Artist, MD;  Location: [facility] ENDOSCOPY;  Service: Cardiovascular;  Laterality: N/A;  . TONSILLECTOMY    . TOTAL HIP ARTHROPLASTY Left 08/07/2016   Procedure: LEFT TOTAL HIP ARTHROPLASTY ANTERIOR APPROACH;  Surgeon: Gaynelle Arabian, MD;  Location: [facility] ORS;  Service: Orthopedics;  Laterality: Left;   Family History  Problem Relation Age of Onset  . Lung cancer Father   . Heart disease Mother   . Clotting disorder Mother 22       coronary thrombosis  . Colon cancer Sister        dx in her 69s  . Breast cancer Sister 64  . Multiple myeloma Paternal  Grandmother   . Brain cancer Paternal Uncle   . Cancer Paternal Aunt        unknown  . Colon cancer Maternal Aunt 80  . Prostate cancer Maternal Uncle        dx in his late 68s  . Other Maternal Uncle        brain tumor dx in his 55s  . Lung cancer Maternal Uncle   . Cancer Maternal Uncle        NOS  . Stomach cancer Paternal Aunt   . Brain cancer Cousin        maternal first cousin dx in late 53s-50s  . Cancer Paternal Uncle        NOS  . Colon cancer Cousin        paternal cousin dx in his 30s  . Cancer Cousin        paternal cousin dx with NOS cancer   Social History  Substance Use Topics  . Smoking status: Never Smoker  . Smokeless tobacco: Never Used    . Alcohol use 0.0 oz/week     Comment: ocassional beer   Current Outpatient Prescriptions  Medication Sig Dispense Refill  . amLODipine (NORVASC) 5 MG tablet Take 5 mg by mouth daily.      . brimonidine-timolol (COMBIGAN) 0.2-0.5 % ophthalmic solution Place 1 drop into the right eye every 12 (twelve) hours.    . clopidogrel (PLAVIX) 75 MG tablet Take 75 mg by mouth daily.    . cycloSPORINE (RESTASIS) 0.05 % ophthalmic emulsion Place 1 drop into both eyes 2 (two) times daily.    Marland Kitchen escitalopram (LEXAPRO) 10 MG tablet Take 10 mg by mouth daily.     . famotidine (PEPCID) 40 MG tablet Take 40 mg by mouth daily.    Marland Kitchen FeFum-FePoly-FA-B Cmp-C-Biot (FOLIVANE-PLUS) CAPS TAKE 1 CAPSULE BY MOUTH DAILY 90 capsule 1  . letrozole (FEMARA) 2.5 MG tablet TAKE ONE TABLET BY MOUTH EACH DAY 30 tablet 1  . lisinopril (PRINIVIL,ZESTRIL) 20 MG tablet Take 1 tablet by mouth 2 (two) times daily.    Marland Kitchen osimertinib mesylate (TAGRISSO) 80 MG tablet Take 1 tablet (80 mg total) by mouth daily. 30 tablet 2  . zolpidem (AMBIEN) 5 MG tablet Take 5 mg by mouth at bedtime as needed for sleep. Reported on 04/25/2016     No current facility-administered medications for this visit.    Allergies  Allergen Reactions  . Doxycycline Nausea Only  . Sulfa Antibiotics Rash     Review of Systems: All systems reviewed and negative except where noted in HPI.   Lab Results  Component Value Date   WBC 4.0 02/20/2017   HGB 11.1 (L) 02/20/2017   HCT 34.3 (L) 02/20/2017   MCV 89.6 02/20/2017   PLT 219 02/20/2017    Lab Results  Component Value Date   CREATININE 1.1 02/20/2017   BUN 18.3 02/20/2017   NA 142 02/20/2017   K 4.7 02/20/2017   CL 105 09/30/2016   CO2 26 02/20/2017    Lab Results  Component Value Date   ALT 15 02/20/2017   AST 20 02/20/2017   ALKPHOS 83 02/20/2017   BILITOT 0.45 02/20/2017     Physical Exam: BP (!) 106/50   Pulse 72   Ht '5\' 8"'$  (1.727 m)   Wt 141 lb 8 oz (64.2 kg)   BMI 21.52 kg/m   Constitutional: Pleasant,well-developed, female in no acute distress. HEENT: Normocephalic and atraumatic. Conjunctivae are normal. No scleral icterus.  Neck supple.  Cardiovascular: Normal rate, regular rhythm.  Pulmonary/chest: Effort normal and breath sounds normal. No wheezing, rales or rhonchi. Abdominal: Soft, nondistended, nontender.  There are no masses palpable. No hepatomegaly. Extremities: no edema Lymphadenopathy: No cervical adenopathy noted. Neurological: Alert and oriented to person place and time. Skin: Skin is warm and dry. No rashes noted. Psychiatric: Normal mood and affect. Behavior is normal.   ASSESSMENT AND PLAN: 76 year old female known to me from colonoscopy last year at which point she was diagnosed with colon cancer the ascending colon, now status post surgery. She also has a history of lung cancer for which she is on regimen as outlined above, but doing really well on the regimen, last staging CT showed improved disease, she remains very functional.   We discussed whether or not she wanted a surveillance colonoscopy, now that she is about one year out from her surgery. She discussed this with her oncologist and wants to proceed with an exam at this time. Depending on the results of this exam, given her comorbidities and her course she may decline future exams. I discussed risks and benefits of colonoscopy and anesthesia with her, following this discussion she wished proceed. She'll need to hold her Plavix 5 days prior to the procedure, she can use aspirin during this time given her history of CVA. All questions answered, she wished to proceed.   Lenox Cellar, MD [facility] Pager [phone]  CC: Eber Hong, MD

## 2017-03-03 NOTE — Telephone Encounter (Signed)
03/03/2017   RE: Kristin [name] DOB: 1940-11-08 MRN: [id number]   Dear Dr. Brynda Greathouse,    We have scheduled the above patient for an endoscopic procedure. Our records show that she is on anticoagulation therapy.   Please advise as to how long the patient may come off her therapy of Plavix prior to the procedure, which is scheduled for July 6th, 2018.  Please fax back/ or route the completed form to [place] at [phone].   Sincerely,   Caryl Pina, LPN

## 2017-03-03 NOTE — Patient Instructions (Signed)
If you are age 78 or older, your body mass index should be between 23-30. Your Body mass index is 21.52 kg/m. If this is out of the aforementioned range listed, please consider follow up with your Primary Care Provider.  If you are age 72 or younger, your body mass index should be between 19-25. Your Body mass index is 21.52 kg/m. If this is out of the aformentioned range listed, please consider follow up with your Primary Care Provider.   We have sent the following medications to your pharmacy for you to pick up at your convenience:  [place] have been scheduled for a colonoscopy. Please follow written instructions given to you at your visit today.  Please pick up your prep supplies at the pharmacy within the next 1-3 days. If you use inhalers (even only as needed), please bring them with you on the day of your procedure. Your physician has requested that you go to www.startemmi.com and enter the access code given to you at your visit today. This web site gives a general overview about your procedure. However, you should still follow specific instructions given to you by our office regarding your preparation for the procedure.  Thank you.

## 2017-03-04 ENCOUNTER — Telehealth: Payer: Self-pay

## 2017-03-04 NOTE — Telephone Encounter (Signed)
Per Dr Brynda Greathouse pt can d/c Plavix 5 days prior to procedure. She should use ASA '81mg'$  during this time.  Pt informed and understood.

## 2017-03-24 ENCOUNTER — Other Ambulatory Visit: Payer: Self-pay | Admitting: Medical Oncology

## 2017-03-24 DIAGNOSIS — C182 Malignant neoplasm of ascending colon: Secondary | ICD-10-CM

## 2017-03-24 DIAGNOSIS — Z170 Estrogen receptor positive status [ER+]: Secondary | ICD-10-CM

## 2017-03-24 DIAGNOSIS — D638 Anemia in other chronic diseases classified elsewhere: Secondary | ICD-10-CM

## 2017-03-24 DIAGNOSIS — C50111 Malignant neoplasm of central portion of right female breast: Secondary | ICD-10-CM

## 2017-03-24 DIAGNOSIS — C50112 Malignant neoplasm of central portion of left female breast: Secondary | ICD-10-CM

## 2017-03-24 DIAGNOSIS — C3412 Malignant neoplasm of upper lobe, left bronchus or lung: Secondary | ICD-10-CM

## 2017-03-24 DIAGNOSIS — Z5111 Encounter for antineoplastic chemotherapy: Secondary | ICD-10-CM

## 2017-03-24 MED ORDER — OSIMERTINIB MESYLATE 80 MG PO TABS: 80.0000 mg | ORAL_TABLET | Freq: Every day | ORAL | 2 refills | Status: DC

## 2017-03-25 ENCOUNTER — Encounter: Payer: Self-pay | Admitting: Internal Medicine

## 2017-03-25 ENCOUNTER — Telehealth: Payer: Self-pay | Admitting: Internal Medicine

## 2017-03-25 ENCOUNTER — Other Ambulatory Visit (HOSPITAL_BASED_OUTPATIENT_CLINIC_OR_DEPARTMENT_OTHER): Payer: Medicare Other

## 2017-03-25 ENCOUNTER — Ambulatory Visit (HOSPITAL_BASED_OUTPATIENT_CLINIC_OR_DEPARTMENT_OTHER): Payer: Medicare Other | Admitting: Internal Medicine

## 2017-03-25 VITALS — BP 154/46 | HR 57 | Temp 97.9°F | Resp 20 | Ht 68.0 in | Wt 142.7 lb

## 2017-03-25 DIAGNOSIS — Z85038 Personal history of other malignant neoplasm of large intestine: Secondary | ICD-10-CM | POA: Diagnosis not present

## 2017-03-25 DIAGNOSIS — C50111 Malignant neoplasm of central portion of right female breast: Secondary | ICD-10-CM

## 2017-03-25 DIAGNOSIS — C182 Malignant neoplasm of ascending colon: Secondary | ICD-10-CM

## 2017-03-25 DIAGNOSIS — C3412 Malignant neoplasm of upper lobe, left bronchus or lung: Secondary | ICD-10-CM

## 2017-03-25 DIAGNOSIS — C50112 Malignant neoplasm of central portion of left female breast: Secondary | ICD-10-CM | POA: Diagnosis not present

## 2017-03-25 DIAGNOSIS — Z170 Estrogen receptor positive status [ER+]: Secondary | ICD-10-CM

## 2017-03-25 DIAGNOSIS — C3492 Malignant neoplasm of unspecified part of left bronchus or lung: Secondary | ICD-10-CM

## 2017-03-25 DIAGNOSIS — Z5111 Encounter for antineoplastic chemotherapy: Secondary | ICD-10-CM

## 2017-03-25 LAB — CBC WITH DIFFERENTIAL/PLATELET
BASO%: 0.6 % (ref 0.0–2.0)
BASOS ABS: 0 10*3/uL (ref 0.0–0.1)
EOS ABS: 0.1 10*3/uL (ref 0.0–0.5)
EOS%: 2 % (ref 0.0–7.0)
HCT: 33.8 % — ABNORMAL LOW (ref 34.8–46.6)
HEMOGLOBIN: 11.1 g/dL — AB (ref 11.6–15.9)
LYMPH%: 22.2 % (ref 14.0–49.7)
MCH: 29.7 pg (ref 25.1–34.0)
MCHC: 32.9 g/dL (ref 31.5–36.0)
MCV: 90.1 fL (ref 79.5–101.0)
MONO#: 0.7 10*3/uL (ref 0.1–0.9)
MONO%: 16.7 % — ABNORMAL HIGH (ref 0.0–14.0)
NEUT%: 58.5 % (ref 38.4–76.8)
NEUTROS ABS: 2.4 10*3/uL (ref 1.5–6.5)
PLATELETS: 238 10*3/uL (ref 145–400)
RBC: 3.75 10*6/uL (ref 3.70–5.45)
RDW: 14.1 % (ref 11.2–14.5)
WBC: 4.1 10*3/uL (ref 3.9–10.3)
lymph#: 0.9 10*3/uL (ref 0.9–3.3)

## 2017-03-25 LAB — COMPREHENSIVE METABOLIC PANEL
ALBUMIN: 3.6 g/dL (ref 3.5–5.0)
ALK PHOS: 75 U/L (ref 40–150)
ALT: 13 U/L (ref 0–55)
ANION GAP: 8 meq/L (ref 3–11)
AST: 19 U/L (ref 5–34)
BILIRUBIN TOTAL: 0.42 mg/dL (ref 0.20–1.20)
BUN: 13.4 mg/dL (ref 7.0–26.0)
CO2: 25 mEq/L (ref 22–29)
Calcium: 10 mg/dL (ref 8.4–10.4)
Chloride: 108 mEq/L (ref 98–109)
Creatinine: 1.1 mg/dL (ref 0.6–1.1)
EGFR: 49 mL/min/{1.73_m2} — ABNORMAL LOW (ref >90)
Glucose: 103 mg/dl (ref 70–140)
Potassium: 4.8 mEq/L (ref 3.5–5.1)
Sodium: 140 mEq/L (ref 136–145)
TOTAL PROTEIN: 6.5 g/dL (ref 6.4–8.3)

## 2017-03-25 NOTE — Telephone Encounter (Signed)
Scheduled appt per 6/5 LOS - Central radiology to contact patient with CT schedule.

## 2017-03-25 NOTE — Progress Notes (Signed)
[facility] [phone]   [phone]  OFFICE PROGRESS NOTE  Eber Hong, MD [address] 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 2) adenocarcinoma of the ascending colon (T2, N0, M0) with MSI high diagnosed in March 2017. 3) history of breast adenocarcinoma.  PRIOR THERAPY:  1) Tarceva 150 mg by mouth daily status post 68 months of treatment. 2) Status post right colectomy with lymph node dissection in May 2017.  CURRENT THERAPY: 1) Tagrisso 80 mg by mouth daily started 11/09/2016. 2) observation for the history of colon adenocarcinoma. 3) Femara 2.5 mg by mouth daily for history of breast cancer.  INTERVAL HISTORY: Kristin [name] 76 y.o. female returns to the clinic today for follow-up visit accompanied by her daughter [name]. The patient is feeling fine today with no specific complaints. She is tolerating her current treatment with Tagrisso fairly well. She denied having any skin rash or diarrhea. She has no nausea, vomiting, diarrhea or constipation. She denied having any fever or chills. She denied having any weight loss or night sweats. She has no chest pain, shortness of breath, cough or hemoptysis. She is here today for evaluation and repeat blood work.  MEDICAL HISTORY: Past Medical History:  Diagnosis Date  . Anemia   . Anemia of chronic disease 09/21/2016  . Anxiety   . Arthritis    arthritis- left hip  . Blood transfusion without reported diagnosis    transfusion- 3-4 yrs ago -found to be anemic on routine lab check  . Breast cancer ([place]) 2002   bilateral- bilateral mastectomies done.  . 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 CVA (cerebral infarction) 04/30/2013  . Encounter for antineoplastic chemotherapy 10/28/2016  . Family history  of brain cancer   . Family history of breast cancer   . Family history of colon cancer 10/06/2009  . Family history of colon cancer   . Family history of prostate cancer   . GERD (gastroesophageal reflux disease)   . Glaucoma   . History of bilateral mastectomy 05/20/2010  . Hypertension   . Lung cancer ([place]) dx'd 12/2010   last Ct scan "no lung cancer" showing 11'16 CT Chest Epic.  . Malignant neoplasm of ascending colon ([place]) 01/29/2016  . Stroke [facility])    2 yrs ago-no residual  . Tubular adenoma of colon 07/2011   colon polyps ans reoccurence with malignancy found    ALLERGIES:  is allergic to doxycycline and sulfa antibiotics.  MEDICATIONS:  Current Outpatient Prescriptions  Medication Sig Dispense Refill  . amLODipine (NORVASC) 5 MG tablet Take 5 mg by mouth daily.      . brimonidine-timolol (COMBIGAN) 0.2-0.5 % ophthalmic solution Place 1 drop into the right eye every 12 (twelve) hours.    . clopidogrel (PLAVIX) 75 MG tablet Take 75 mg by mouth daily.    . cycloSPORINE (RESTASIS) 0.05 % ophthalmic emulsion Place 1 drop into both eyes 2 (two) times daily.    Marland Kitchen escitalopram (LEXAPRO) 10 MG tablet Take 10 mg by mouth daily.     . famotidine (PEPCID) 40 MG tablet Take 40 mg by mouth daily.    Marland Kitchen FeFum-FePoly-FA-B Cmp-C-Biot (FOLIVANE-PLUS) CAPS TAKE 1 CAPSULE BY MOUTH DAILY 90 capsule 1  . letrozole (FEMARA) 2.5 MG tablet TAKE ONE TABLET BY MOUTH EACH DAY 30 tablet 1  .  lisinopril (PRINIVIL,ZESTRIL) 20 MG tablet Take 1 tablet by mouth 2 (two) times daily.    Marland Kitchen osimertinib mesylate (TAGRISSO) 80 MG tablet Take 1 tablet (80 mg total) by mouth daily. 30 tablet 2  . zolpidem (AMBIEN) 5 MG tablet Take 5 mg by mouth at bedtime as needed for sleep. Reported on 04/25/2016     No current facility-administered medications for this visit.     SURGICAL HISTORY:  Past Surgical History:  Procedure Laterality Date  . APPENDECTOMY    . cataracts Bilateral   . COLON SURGERY  02/2016  .  COLONOSCOPY    . EYE SURGERY    . LUNG SURGERY     Resection -" not done. Pt . tx with Tarceva  . MASTECTOMY Bilateral   . MEDIASTINOSCOPY N/A 10/02/2016   Procedure: MEDIASTINOSCOPY;  Surgeon: [name], MD;  Location: [facility] OR;  Service: Thoracic;  Laterality: N/A;  . RECONSTRUCTION BREAST W/ TRAM FLAP Bilateral    bilaterally  . TEE WITHOUT CARDIOVERSION N/A 06/04/2013   Procedure: TRANSESOPHAGEAL ECHOCARDIOGRAM (TEE);  Surgeon: Jolaine Artist, MD;  Location: [facility] ENDOSCOPY;  Service: Cardiovascular;  Laterality: N/A;  . TONSILLECTOMY    . TOTAL HIP ARTHROPLASTY Left 08/07/2016   Procedure: LEFT TOTAL HIP ARTHROPLASTY ANTERIOR APPROACH;  Surgeon: Gaynelle Arabian, MD;  Location: [facility] ORS;  Service: Orthopedics;  Laterality: Left;    REVIEW OF SYSTEMS:  A comprehensive review of systems was negative.   PHYSICAL EXAMINATION: General appearance: alert, cooperative and no distress Head: Normocephalic, without obvious abnormality, atraumatic Neck: no adenopathy, no JVD, supple, symmetrical, trachea midline and thyroid not enlarged, symmetric, no tenderness/mass/nodules Lymph nodes: Cervical, supraclavicular, and axillary nodes normal. Resp: clear to auscultation bilaterally Back: symmetric, no curvature. ROM normal. No CVA tenderness. Cardio: regular rate and rhythm, S1, S2 normal, no murmur, click, rub or gallop GI: soft, non-tender; bowel sounds normal; no masses,  no organomegaly Extremities: extremities normal, atraumatic, no cyanosis or edema  ECOG PERFORMANCE STATUS: 1 - Symptomatic but completely ambulatory  Blood pressure (!) 154/46, pulse (!) 57, temperature 97.9 F (36.6 C), temperature source Oral, resp. rate 20, height _0  (1.727 m), weight 142 lb 11.2 oz (64.7 kg), SpO2 100 %.  LABORATORY DATA: Lab Results  Component Value Date   WBC 4.1 03/25/2017   HGB 11.1 (L) 03/25/2017   HCT 33.8 (L) 03/25/2017   MCV 90.1 03/25/2017   PLT 238 03/25/2017      Chemistry       Component Value Date/Time   NA 142 02/20/2017 1002   K 4.7 02/20/2017 1002   CL 105 09/30/2016 1100   CL 107 04/05/2013 0754   CO2 26 02/20/2017 1002   BUN 18.3 02/20/2017 1002   CREATININE 1.1 02/20/2017 1002      Component Value Date/Time   CALCIUM 10.0 02/20/2017 1002   ALKPHOS 83 02/20/2017 1002   AST 20 02/20/2017 1002   ALT 15 02/20/2017 1002   BILITOT 0.45 02/20/2017 1002       RADIOGRAPHIC STUDIES: No results found.  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 The patient was started on treatment with Tagrisso 80 mg by mouth daily status post 5 months of treatment and has been tolerating it well. I recommended for  her to continue her current treatment with Tagrisso with the same dose. I will see her back for follow-up visit in one month's for evaluation after repeating CT scan of the chest, abdomen and pelvis for restaging of her disease. For the history of colon cancer, she is scheduled for repeat colonoscopy in early July 2018. The patient was advised to call immediately if she has any concerning symptoms in the interval. The patient voices understanding of current disease status and treatment options and is in agreement with the current care plan. All questions were answered. The patient knows to call the clinic with any problems, questions or concerns. We can certainly see the patient much sooner if necessary. I spent 10 minutes counseling the patient face to face. The total time spent in the appointment was 15 minutes. Disclaimer: This note was dictated with voice recognition software. Similar sounding words can inadvertently be transcribed and may not be corrected upon review.

## 2017-04-16 ENCOUNTER — Telehealth: Payer: Self-pay | Admitting: Pharmacy Technician

## 2017-04-16 NOTE — Telephone Encounter (Signed)
Oral Oncology Patient Advocate Encounter  Received notification from patient's insurance company that prior authorization for [name] is coming due for renewal (expiring on 04-28-17)  PA request submitted via phone Reference number = [id number] Status is pending  Oral Oncology Clinic will continue to follow.  [name] [name], [facility] Patient Advocate [phone] 04/16/2017 2:22 PM

## 2017-04-18 NOTE — Telephone Encounter (Signed)
Oral Oncology Patient Advocate Encounter  Prior Authorization for [name] has been approved.    Member #[id number] Reference# [id number] Effective dates: 04/18/2017 through 10/18/2017   [name] [name], [place] [facility] Patient Advocate [phone] 04/18/2017 8:16 AM

## 2017-04-21 ENCOUNTER — Telehealth: Payer: Self-pay | Admitting: *Deleted

## 2017-04-21 NOTE — Telephone Encounter (Signed)
pt medication Tagrisso has been shipped by [employer]

## 2017-04-24 ENCOUNTER — Other Ambulatory Visit: Payer: Self-pay | Admitting: Internal Medicine

## 2017-04-25 ENCOUNTER — Ambulatory Visit (AMBULATORY_SURGERY_CENTER): Payer: Medicare Other | Admitting: Gastroenterology

## 2017-04-25 ENCOUNTER — Encounter: Payer: Self-pay | Admitting: Gastroenterology

## 2017-04-25 VITALS — BP 163/71 | HR 63 | Temp 98.4°F | Resp 22 | Ht 68.0 in | Wt 142.0 lb

## 2017-04-25 DIAGNOSIS — D124 Benign neoplasm of descending colon: Secondary | ICD-10-CM

## 2017-04-25 DIAGNOSIS — Z85038 Personal history of other malignant neoplasm of large intestine: Secondary | ICD-10-CM

## 2017-04-25 DIAGNOSIS — K635 Polyp of colon: Secondary | ICD-10-CM | POA: Diagnosis not present

## 2017-04-25 DIAGNOSIS — D123 Benign neoplasm of transverse colon: Secondary | ICD-10-CM | POA: Diagnosis not present

## 2017-04-25 MED ORDER — SODIUM CHLORIDE 0.9 % IV SOLN: 500.0000 mL | INTRAVENOUS | Status: AC

## 2017-04-25 NOTE — Progress Notes (Signed)
To PACU, vss patent aw report to rn 

## 2017-04-25 NOTE — Op Note (Signed)
[facility] Patient Name: Kristin [name] Procedure Date: 04/25/2017 1:25 PM MRN: [id number] Endoscopist: Remo Lipps P. Aneira Cavitt MD, MD Age: 76 Referring MD:  Date of Birth: Apr 23, 1941 Gender: Female Account #: [id number] Procedure:                Colonoscopy Indications:              High risk colon cancer surveillance: Personal                            history of colon cancer one year ago Medicines:                Monitored Anesthesia Care Procedure:                Pre-Anesthesia Assessment:                           - Prior to the procedure, a History and Physical                            was performed, and patient medications and                            allergies were reviewed. The patient's tolerance of                            previous anesthesia was also reviewed. The risks                            and benefits of the procedure and the sedation                            options and risks were discussed with the patient.                            All questions were answered, and informed consent                            was obtained. Prior Anticoagulants: The patient has                            taken Plavix (clopidogrel), last dose was 5 days                            prior to procedure. ASA Grade Assessment: III - A                            patient with severe systemic disease. After                            reviewing the risks and benefits, the patient was                            deemed in satisfactory condition to undergo the  procedure.                           After obtaining informed consent, the colonoscope                            was passed under direct vision. Throughout the                            procedure, the patient's blood pressure, pulse, and                            oxygen saturations were monitored continuously. The                            Colonoscope was introduced through the anus and                          advanced to the the ileocolonic anastomosis. The                            colonoscopy was performed without difficulty. The                            patient tolerated the procedure well. The quality                            of the bowel preparation was good. The terminal                            ileum and the rectum were photographed. Scope In: 1:29:13 PM Scope Out: 1:56:50 PM Scope Withdrawal Time: 0 hours 22 minutes 15 seconds  Total Procedure Duration: 0 hours 27 minutes 37 seconds  Findings:                 The perianal and digital rectal examinations were                            normal.                           There was evidence of a prior end-to-end                            colo-colonic anastomosis in the right / proximal                            transverse colon. This was patent and was                            characterized by healthy appearing mucosa.                           Two flat polyps were found in the transverse colon.  The polyps were 8 to 10 mm in size. These polyps                            were removed with a cold snare. Resection and                            retrieval were complete.                           A 7 mm polyp was found in the descending colon. The                            polyp was flat. The polyp was removed with a cold                            snare. Resection and retrieval were complete.                           Scattered medium-mouthed diverticula were found in                            the left colon.                           Internal hemorrhoids were found during retroflexion.                           The colon was extremely tortous and with restricted                            mobility in the rectosigmoid colon. The exam was                            otherwise without abnormality. Complications:            No immediate complications. Estimated blood loss:                             Minimal. Estimated Blood Loss:     Estimated blood loss was minimal. Impression:               - Patent end-to-end colo-colonic anastomosis,                            characterized by healthy appearing mucosa.                           - Two 8 to 10 mm polyps in the transverse colon,                            removed with a cold snare. Resected and retrieved.                           - One 7 mm polyp in the descending colon, removed  with a cold snare. Resected and retrieved.                           - Diverticulosis in the left colon.                           - Internal hemorrhoids.                           - The examination was otherwise normal. Recommendation:           - Patient has a contact number available for                            emergencies. The signs and symptoms of potential                            delayed complications were discussed with the                            patient. Return to normal activities tomorrow.                            Written discharge instructions were provided to the                            patient.                           - Resume previous diet.                           - Continue present medications.                           - Resume plavix in 3 days                           - Await pathology results.                           - Repeat colonoscopy is recommended for                            surveillance. The colonoscopy date will be                            determined after pathology results from today's                            exam become available for review.                           - No ibuprofen, naproxen, or other non-steroidal                            anti-inflammatory drugs for 2 weeks after polyp  removal. Remo Lipps P. Marx Doig MD, MD 04/25/2017 2:03:01 PM This report has been signed electronically.

## 2017-04-25 NOTE — Progress Notes (Signed)
Called to room to assist during endoscopic procedure.  Patient ID and intended procedure confirmed with present staff. Received instructions for my participation in the procedure from the performing physician.  

## 2017-04-25 NOTE — Patient Instructions (Signed)
YOU HAD AN ENDOSCOPIC PROCEDURE TODAY AT [facility]:   Refer to the procedure report that was given to you for any specific questions about what was found during the examination.  If the procedure report does not answer your questions, please call your gastroenterologist to clarify.  If you requested that your care partner not be given the details of your procedure findings, then the procedure report has been included in a sealed envelope for you to review at your convenience later.  YOU SHOULD EXPECT: Some feelings of bloating in the abdomen. Passage of more gas than usual.  Walking can help get rid of the air that was put into your GI tract during the procedure and reduce the bloating. If you had a lower endoscopy (such as a colonoscopy or flexible sigmoidoscopy) you may notice spotting of blood in your stool or on the toilet paper. If you underwent a bowel prep for your procedure, you may not have a normal bowel movement for a few days.  Please Note:  You might notice some irritation and congestion in your nose or some drainage.  This is from the oxygen used during your procedure.  There is no need for concern and it should clear up in a day or so.  SYMPTOMS TO REPORT IMMEDIATELY:   Following lower endoscopy (colonoscopy or flexible sigmoidoscopy):  Excessive amounts of blood in the stool  Significant tenderness or worsening of abdominal pains  Swelling of the abdomen that is new, acute  Fever of 100F or higher   For urgent or emergent issues, a gastroenterologist can be reached at any hour by calling [phone].   DIET:  We do recommend a small meal at first, but then you may proceed to your regular diet.  Drink plenty of fluids but you should avoid alcoholic beverages for 24 hours.  ACTIVITY:  You should plan to take it easy for the rest of today and you should NOT DRIVE or use heavy machinery until tomorrow (because of the sedation medicines used during the test).     FOLLOW UP: Our staff will call the number listed on your records the next business day following your procedure to check on you and address any questions or concerns that you may have regarding the information given to you following your procedure. If we do not reach you, we will leave a message.  However, if you are feeling well and you are not experiencing any problems, there is no need to return our call.  We will assume that you have returned to your regular daily activities without incident.  If any biopsies were taken you will be contacted by phone or by letter within the next 1-3 weeks.  Please call us at [phone] if you have not heard about the biopsies in 3 weeks.    SIGNATURES/CONFIDENTIALITY: You and/or your care partner have signed paperwork which will be entered into your electronic medical record.  These signatures attest to the fact that that the information above on your After Visit Summary has been reviewed and is understood.  Full responsibility of the confidentiality of this discharge information lies with you and/or your care-partner.  Polyp, diverticulosis, high fiber diet and hemorrhoid informtion given,

## 2017-04-28 ENCOUNTER — Ambulatory Visit (HOSPITAL_COMMUNITY)
Admission: RE | Admit: 2017-04-28 | Discharge: 2017-04-28 | Disposition: A | Discharge status: Home / Self Care | Payer: Medicare Other | Source: Ambulatory Visit | Attending: Internal Medicine | Admitting: Internal Medicine

## 2017-04-28 ENCOUNTER — Encounter (HOSPITAL_COMMUNITY): Payer: Self-pay

## 2017-04-28 ENCOUNTER — Telehealth: Payer: Self-pay | Admitting: *Deleted

## 2017-04-28 ENCOUNTER — Other Ambulatory Visit: Payer: Self-pay

## 2017-04-28 ENCOUNTER — Other Ambulatory Visit (HOSPITAL_BASED_OUTPATIENT_CLINIC_OR_DEPARTMENT_OTHER): Payer: Medicare Other

## 2017-04-28 DIAGNOSIS — Z170 Estrogen receptor positive status [ER+]: Secondary | ICD-10-CM | POA: Diagnosis not present

## 2017-04-28 DIAGNOSIS — Z5111 Encounter for antineoplastic chemotherapy: Secondary | ICD-10-CM | POA: Diagnosis not present

## 2017-04-28 DIAGNOSIS — C182 Malignant neoplasm of ascending colon: Secondary | ICD-10-CM | POA: Diagnosis present

## 2017-04-28 DIAGNOSIS — C50112 Malignant neoplasm of central portion of left female breast: Secondary | ICD-10-CM | POA: Insufficient documentation

## 2017-04-28 DIAGNOSIS — C3412 Malignant neoplasm of upper lobe, left bronchus or lung: Secondary | ICD-10-CM

## 2017-04-28 DIAGNOSIS — C50111 Malignant neoplasm of central portion of right female breast: Secondary | ICD-10-CM

## 2017-04-28 DIAGNOSIS — Z9013 Acquired absence of bilateral breasts and nipples: Secondary | ICD-10-CM | POA: Insufficient documentation

## 2017-04-28 LAB — CBC WITH DIFFERENTIAL/PLATELET
BASO%: 0.9 % (ref 0.0–2.0)
Basophils Absolute: 0 10*3/uL (ref 0.0–0.1)
EOS%: 2.3 % (ref 0.0–7.0)
Eosinophils Absolute: 0.1 10*3/uL (ref 0.0–0.5)
HCT: 31.6 % — ABNORMAL LOW (ref 34.8–46.6)
HEMOGLOBIN: 10.4 g/dL — AB (ref 11.6–15.9)
LYMPH%: 15.6 % (ref 14.0–49.7)
MCH: 29.8 pg (ref 25.1–34.0)
MCHC: 32.9 g/dL (ref 31.5–36.0)
MCV: 90.5 fL (ref 79.5–101.0)
MONO#: 0.6 10*3/uL (ref 0.1–0.9)
MONO%: 12.3 % (ref 0.0–14.0)
NEUT%: 68.9 % (ref 38.4–76.8)
NEUTROS ABS: 3.2 10*3/uL (ref 1.5–6.5)
Platelets: 281 10*3/uL (ref 145–400)
RBC: 3.49 10*6/uL — ABNORMAL LOW (ref 3.70–5.45)
RDW: 14.3 % (ref 11.2–14.5)
WBC: 4.6 10*3/uL (ref 3.9–10.3)
lymph#: 0.7 10*3/uL — ABNORMAL LOW (ref 0.9–3.3)

## 2017-04-28 LAB — COMPREHENSIVE METABOLIC PANEL
ALBUMIN: 3.6 g/dL (ref 3.5–5.0)
ALK PHOS: 76 U/L (ref 40–150)
ALT: 16 U/L (ref 0–55)
AST: 18 U/L (ref 5–34)
Anion Gap: 7 mEq/L (ref 3–11)
BUN: 13.9 mg/dL (ref 7.0–26.0)
CO2: 26 mEq/L (ref 22–29)
Calcium: 10 mg/dL (ref 8.4–10.4)
Chloride: 107 mEq/L (ref 98–109)
Creatinine: 1.3 mg/dL — ABNORMAL HIGH (ref 0.6–1.1)
EGFR: 41 mL/min/{1.73_m2} — ABNORMAL LOW (ref >90)
GLUCOSE: 92 mg/dL (ref 70–140)
POTASSIUM: 4.3 meq/L (ref 3.5–5.1)
SODIUM: 140 meq/L (ref 136–145)
Total Bilirubin: 0.46 mg/dL (ref 0.20–1.20)
Total Protein: 6.7 g/dL (ref 6.4–8.3)

## 2017-04-28 LAB — LACTATE DEHYDROGENASE: LDH: 166 U/L (ref 125–245)

## 2017-04-28 MED ORDER — IOPAMIDOL (ISOVUE-300) INJECTION 61%
INTRAVENOUS | Status: AC
  Administered 2017-04-28: 80 mL via INTRAVENOUS
  Filled 2017-04-28: qty 100

## 2017-04-28 MED ORDER — IOPAMIDOL (ISOVUE-300) INJECTION 61%
100.0000 mL | Freq: Once | INTRAVENOUS | Status: AC | PRN
  Administered 2017-04-28: 80 mL via INTRAVENOUS

## 2017-04-28 NOTE — Telephone Encounter (Signed)
  Follow up Call-  Call back number 04/25/2017 01/08/2016  Post procedure Call Back phone  # [phone]  Permission to leave phone message Yes Yes  Some recent data might be hidden     Patient questions:  Do you have a fever, pain , or abdominal swelling? No. Pain Score  0 *  Have you tolerated food without any problems? Yes.    Have you been able to return to your normal activities? Yes.    Do you have any questions about your discharge instructions: Diet   No. Medications  No. Follow up visit  No.  Do you have questions or concerns about your Care? No.  Actions: * If pain score is 4 or above: No action needed, pain <4.

## 2017-04-28 NOTE — Telephone Encounter (Signed)
  Follow up Call-  Call back number 04/25/2017 01/08/2016  Post procedure Call Back phone  # [phone]  Permission to leave phone message Yes Yes  Some recent data might be hidden     No answer, left message

## 2017-04-29 ENCOUNTER — Encounter: Payer: Self-pay | Admitting: Internal Medicine

## 2017-04-29 ENCOUNTER — Ambulatory Visit (HOSPITAL_BASED_OUTPATIENT_CLINIC_OR_DEPARTMENT_OTHER): Payer: Medicare Other | Admitting: Internal Medicine

## 2017-04-29 ENCOUNTER — Telehealth: Payer: Self-pay | Admitting: Internal Medicine

## 2017-04-29 VITALS — BP 169/63 | HR 73 | Temp 97.8°F | Resp 17 | Ht 68.0 in | Wt 144.0 lb

## 2017-04-29 DIAGNOSIS — Z170 Estrogen receptor positive status [ER+]: Secondary | ICD-10-CM

## 2017-04-29 DIAGNOSIS — D638 Anemia in other chronic diseases classified elsewhere: Secondary | ICD-10-CM

## 2017-04-29 DIAGNOSIS — C3412 Malignant neoplasm of upper lobe, left bronchus or lung: Secondary | ICD-10-CM | POA: Diagnosis present

## 2017-04-29 DIAGNOSIS — C50112 Malignant neoplasm of central portion of left female breast: Secondary | ICD-10-CM

## 2017-04-29 DIAGNOSIS — I10 Essential (primary) hypertension: Secondary | ICD-10-CM | POA: Diagnosis not present

## 2017-04-29 DIAGNOSIS — Z5111 Encounter for antineoplastic chemotherapy: Secondary | ICD-10-CM

## 2017-04-29 DIAGNOSIS — C50111 Malignant neoplasm of central portion of right female breast: Secondary | ICD-10-CM

## 2017-04-29 DIAGNOSIS — Z85038 Personal history of other malignant neoplasm of large intestine: Secondary | ICD-10-CM

## 2017-04-29 DIAGNOSIS — C182 Malignant neoplasm of ascending colon: Secondary | ICD-10-CM

## 2017-04-29 NOTE — Telephone Encounter (Signed)
Gave patient avs report and appointments for August.  °

## 2017-04-29 NOTE — Progress Notes (Signed)
[facility] [phone]   Fax:([phone]  OFFICE PROGRESS NOTE  Eber Hong, MD [address] [place] [address]  DIAGNOSIS:  1) metastatic non-small cell lung cancer, adenocarcinoma diagnosed in March 2012 with positive EGFR mutation in exon 21 (L858R). 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 2) adenocarcinoma of the ascending colon (T2, N0, M0) with MSI high diagnosed in March 2017. 3) history of breast adenocarcinoma.  PRIOR THERAPY:  1) Tarceva 150 mg by mouth daily status post 68 months of treatment. 2) Status post right colectomy with lymph node dissection in May 2017.  CURRENT THERAPY: 1) Tagrisso 80 mg by mouth daily started 11/09/2016. 2) observation for the history of colon adenocarcinoma. 3) Femara 2.5 mg by mouth daily for history of breast cancer.  INTERVAL HISTORY: Kristin [name] 76 y.o. female returns to the clinic today for follow-up visit accompanied by her daughter and granddaughter. The patient is feeling fine today with no specific complaints. She denied having any chest pain, shortness breath, cough or hemoptysis. She has no fever or chills. She denied having any nausea, vomiting, diarrhea or constipation. She had recent colonoscopy that showed no concerning finding except for a few polyps that were removed. She is here today for evaluation after repeating CT scan of the chest, abdomen and pelvis for restaging of her disease.  MEDICAL HISTORY: Past Medical History:  Diagnosis Date  . Allergy   . Anemia   . Anemia of chronic disease 09/21/2016  . Anxiety   . Arthritis    arthritis- left hip  . Blood transfusion without reported diagnosis    transfusion- 3-4 yrs ago -found to be anemic on routine lab check  . Breast cancer ([place]) 2002   bilateral- bilateral mastectomies done.  . 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 CVA (cerebral infarction) 04/30/2013  . Encounter for antineoplastic  chemotherapy 10/28/2016  . Family history of brain cancer   . Family history of breast cancer   . Family history of colon cancer 10/06/2009  . Family history of colon cancer   . Family history of prostate cancer   . GERD (gastroesophageal reflux disease)   . Glaucoma   . History of bilateral mastectomy 05/20/2010  . Hypertension   . Lung cancer ([place]) dx'd 12/2010   last Ct scan "no lung cancer" showing 11'16 CT Chest Epic.  . Malignant neoplasm of ascending colon ([place]) 01/29/2016  . Stroke [facility])    2 yrs ago-no residual  . Tubular adenoma of colon 07/2011   colon polyps ans reoccurence with malignancy found    ALLERGIES:  is allergic to doxycycline and sulfa antibiotics.  MEDICATIONS:  Current Outpatient Prescriptions  Medication Sig Dispense Refill  . amLODipine (NORVASC) 5 MG tablet Take 5 mg by mouth daily.      Marland Kitchen aspirin 81 MG tablet Take 81 mg by mouth daily.    . brimonidine-timolol (COMBIGAN) 0.2-0.5 % ophthalmic solution Place 1 drop into the right eye every 12 (twelve) hours.    . clopidogrel (PLAVIX) 75 MG tablet Take 75 mg by mouth daily.    . cycloSPORINE (RESTASIS) 0.05 % ophthalmic emulsion Place 1 drop into both eyes 2 (two) times daily.    Marland Kitchen escitalopram (LEXAPRO) 10 MG tablet Take 10 mg by mouth daily.     . famotidine (PEPCID) 40 MG tablet Take 40 mg by mouth daily.    Marland Kitchen FeFum-FePoly-FA-B Cmp-C-Biot (FOLIVANE-PLUS) CAPS TAKE 1 CAPSULE BY MOUTH DAILY 90  capsule 1  . letrozole (FEMARA) 2.5 MG tablet TAKE ONE TABLET BY MOUTH EACH DAY 30 tablet 3  . lisinopril (PRINIVIL,ZESTRIL) 20 MG tablet Take 1 tablet by mouth 2 (two) times daily.    Marland Kitchen osimertinib mesylate (TAGRISSO) 80 MG tablet Take 1 tablet (80 mg total) by mouth daily. 30 tablet 2  . zolpidem (AMBIEN) 5 MG tablet Take 5 mg by mouth at bedtime as needed for sleep. Reported on 04/25/2016     Current Facility-Administered Medications  Medication Dose Route Frequency Provider Last Rate Last Dose  . 0.9 %  sodium  chloride infusion  500 mL Intravenous Continuous Armbruster, Renelda Loma, MD        SURGICAL HISTORY:  Past Surgical History:  Procedure Laterality Date  . APPENDECTOMY    . cataracts Bilateral   . COLON SURGERY  02/2016  . COLONOSCOPY    . EYE SURGERY    . LUNG SURGERY     Resection -" not done. Pt . tx with Tarceva  . MASTECTOMY Bilateral   . MEDIASTINOSCOPY N/A 10/02/2016   Procedure: MEDIASTINOSCOPY;  Surgeon: [name], MD;  Location: [facility] OR;  Service: Thoracic;  Laterality: N/A;  . RECONSTRUCTION BREAST W/ TRAM FLAP Bilateral    bilaterally  . TEE WITHOUT CARDIOVERSION N/A 06/04/2013   Procedure: TRANSESOPHAGEAL ECHOCARDIOGRAM (TEE);  Surgeon: Jolaine Artist, MD;  Location[facility] ENDOSCOPY;  Service: Cardiovascular;  Laterality: N/A;  . TONSILLECTOMY    . TOTAL HIP ARTHROPLASTY Left 08/07/2016   Procedure: LEFT TOTAL HIP ARTHROPLASTY ANTERIOR APPROACH;  Surgeon: Gaynelle Arabian, MD;  Location: [facility] ORS;  Service: Orthopedics;  Laterality: Left;    REVIEW OF SYSTEMS:  Constitutional: positive for fatigue Eyes: negative Ears, nose, mouth, throat, and face: negative Respiratory: negative Cardiovascular: negative Gastrointestinal: negative Genitourinary:negative Integument/breast: negative Hematologic/lymphatic: negative Musculoskeletal:negative Neurological: negative Behavioral/Psych: negative Endocrine: negative Allergic/Immunologic: negative   PHYSICAL EXAMINATION: General appearance: alert, cooperative, fatigued and no distress Head: Normocephalic, without obvious abnormality, atraumatic Neck: no adenopathy, no JVD, supple, symmetrical, trachea midline and thyroid not enlarged, symmetric, no tenderness/mass/nodules Lymph nodes: Cervical, supraclavicular, and axillary nodes normal. Resp: clear to auscultation bilaterally Back: symmetric, no curvature. ROM normal. No CVA tenderness. Cardio: regular rate and rhythm, S1, S2 normal, no murmur, click, rub or  gallop GI: soft, non-tender; bowel sounds normal; no masses,  no organomegaly Extremities: extremities normal, atraumatic, no cyanosis or edema Neurologic: Alert and oriented X 3, normal strength and tone. Normal symmetric reflexes. Normal coordination and gait  ECOG PERFORMANCE STATUS: 1 - Symptomatic but completely ambulatory  Blood pressure (!) 169/63, pulse 73, temperature 97.8 F (36.6 C), temperature source Oral, resp. rate 17, height 5' 8"  (1.727 m), weight 144 lb (65.3 kg), SpO2 100 %.  LABORATORY DATA: Lab Results  Component Value Date   WBC 4.6 04/28/2017   HGB 10.4 (L) 04/28/2017   HCT 31.6 (L) 04/28/2017   MCV 90.5 04/28/2017   PLT 281 04/28/2017      Chemistry      Component Value Date/Time   NA 140 04/28/2017 1330   K 4.3 04/28/2017 1330   CL 105 09/30/2016 1100   CL 107 04/05/2013 0754   CO2 26 04/28/2017 1330   BUN 13.9 04/28/2017 1330   CREATININE 1.3 (H) 04/28/2017 1330      Component Value Date/Time   CALCIUM 10.0 04/28/2017 1330   ALKPHOS 76 04/28/2017 1330   AST 18 04/28/2017 1330   ALT 16 04/28/2017 1330   BILITOT 0.46 04/28/2017 1330  RADIOGRAPHIC STUDIES: Ct Chest W Contrast  Result Date: 04/29/2017 CLINICAL DATA:  Lung cancer diagnosed 2012, bilateral breast cancer diagnosed 2002, colon cancer diagnosed 2017. Status post bilateral mastectomy, Femara in progress, Tagrisso in progress. EXAM: CT CHEST, ABDOMEN, AND PELVIS WITH CONTRAST TECHNIQUE: Multidetector CT imaging of the chest, abdomen and pelvis was performed following the standard protocol during bolus administration of intravenous contrast. CONTRAST:  19m ISOVUE-300 IOPAMIDOL (ISOVUE-300) INJECTION 61% COMPARISON:  01/10/2017 FINDINGS: CT CHEST FINDINGS Cardiovascular: The heart is normal in size. No pericardial effusion. Mild coronary atherosclerosis in the LAD. No evidence of thoracic aortic aneurysm. Atherosclerotic calcifications of the aortic arch. Mediastinum/Nodes: No suspicious  mediastinal, hilar, or axillary lymphadenopathy. Visualized thyroid is heterogeneous/ nodular. Lungs/Pleura: Status post left upper lobe wedge resection. No suspicious pulmonary nodules. No focal consolidation. No pleural effusion or pneumothorax. Musculoskeletal: Status post bilateral mastectomy. Mild degenerative changes of the lower thoracic spine. CT ABDOMEN PELVIS FINDINGS Hepatobiliary: 2.9 cm cyst in segment 8 of the liver (series 2/ image 50). 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 No intrahepatic or extrahepatic ductal dilatation. Pancreas: Within normal limits. Prior pancreatic head lesion is less conspicuous on the current study. Spleen: Within normal limits. Adrenals/Urinary Tract: Adrenal glands are within normal limits. Kidneys are within normal limits.  No hydronephrosis. Bladder is underdistended but unremarkable. Stomach/Bowel: Stomach is notable for a large hiatal hernia/inverted intrathoracic stomach. No evidence of bowel obstruction. Status post right hemicolectomy with appendectomy. Mild sigmoid diverticulosis, without evidence of diverticulitis. Vascular/Lymphatic: No evidence of abdominal aortic aneurysm. Atherosclerotic calcifications of the abdominal aorta and branch vessels. No suspicious abdominopelvic lymphadenopathy. Reproductive: Uterus is notable for calcified uterine fibroids. No adnexal masses. Other: No abdominopelvic ascites. Musculoskeletal: Mild degenerative changes the lower lumbar spine. IMPRESSION: Status post bilateral mastectomy. Status post left upper lobe wedge resection. Status post right hemicolectomy. No evidence of recurrent or metastatic disease. Electronically Signed   By: SJulian HyM.D.   On: 04/29/2017 08:24   Ct Abdomen Pelvis W Contrast  Result Date: 04/29/2017 CLINICAL DATA:  Lung cancer diagnosed 2012, bilateral breast cancer diagnosed 2002, colon cancer diagnosed 2017. Status post bilateral mastectomy, Femara in progress, Tagrisso in progress.  EXAM: CT CHEST, ABDOMEN, AND PELVIS WITH CONTRAST TECHNIQUE: Multidetector CT imaging of the chest, abdomen and pelvis was performed following the standard protocol during bolus administration of intravenous contrast. CONTRAST:  877mISOVUE-300 IOPAMIDOL (ISOVUE-300) INJECTION 61% COMPARISON:  01/10/2017 FINDINGS: CT CHEST FINDINGS Cardiovascular: The heart is normal in size. No pericardial effusion. Mild coronary atherosclerosis in the LAD. No evidence of thoracic aortic aneurysm. Atherosclerotic calcifications of the aortic arch. Mediastinum/Nodes: No suspicious mediastinal, hilar, or axillary lymphadenopathy. Visualized thyroid is heterogeneous/ nodular. Lungs/Pleura: Status post left upper lobe wedge resection. No suspicious pulmonary nodules. No focal consolidation. No pleural effusion or pneumothorax. Musculoskeletal: Status post bilateral mastectomy. Mild degenerative changes of the lower thoracic spine. CT ABDOMEN PELVIS FINDINGS Hepatobiliary: 2.9 cm cyst in segment 8 of the liver (series 2/ image 50). 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 No intrahepatic or extrahepatic ductal dilatation. Pancreas: Within normal limits. Prior pancreatic head lesion is less conspicuous on the current study. Spleen: Within normal limits. Adrenals/Urinary Tract: Adrenal glands are within normal limits. Kidneys are within normal limits.  No hydronephrosis. Bladder is underdistended but unremarkable. Stomach/Bowel: Stomach is notable for a large hiatal hernia/inverted intrathoracic stomach. No evidence of bowel obstruction. Status post right hemicolectomy with appendectomy. Mild sigmoid diverticulosis, without evidence of diverticulitis. Vascular/Lymphatic: No evidence of abdominal aortic aneurysm. Atherosclerotic calcifications of the abdominal aorta and branch  vessels. No suspicious abdominopelvic lymphadenopathy. Reproductive: Uterus is notable for calcified uterine fibroids. No adnexal masses. Other: No abdominopelvic  ascites. Musculoskeletal: Mild degenerative changes the lower lumbar spine. IMPRESSION: Status post bilateral mastectomy. Status post left upper lobe wedge resection. Status post right hemicolectomy. No evidence of recurrent or metastatic disease. Electronically Signed   By: Julian Hy M.D.   On: 04/29/2017 08:24    ASSESSMENT AND PLAN:  This is a very pleasant 76 years old white female with metastatic non-small cell lung cancer, adenocarcinoma diagnosed in March 2012 with positive EGFR mutation status post 68 months of treatment with Tarceva discontinued secondary to disease progression and development of EGFR T790M resistant mutation. The patient is currently on treatment with Tagrisso 80 mg by mouth daily status post 6 months. She has been tolerating this treatment fairly well. She had repeat CT scan of the chest, abdomen and pelvis performed recently. Her scan showed no evidence for disease recurrence or progression. I discussed the scan results with the patient and her family and recommended for her to continue treatment with Tagrisso with the same dose. The patient is currently on observation for the colon cancer. She will also continue her current treatment with Femara for the breast cancer. For hypertension, I strongly encouraged the patient to take her blood pressure medication as prescribed and to reconsult with her primary care physician for adjustment of her doses. She will come back for follow-up visit in one month's for reevaluation and repeat blood work. The patient was advised to call immediately if she has any concerning symptoms in the interval. The patient voices understanding of current disease status and treatment options and is in agreement with the current care plan. All questions were answered. The patient knows to call the clinic with any problems, questions or concerns. We can certainly see the patient much sooner if necessary.  Disclaimer: This note was dictated with  voice recognition software. Similar sounding words can inadvertently be transcribed and may not be corrected upon review.

## 2017-05-01 ENCOUNTER — Encounter: Payer: Self-pay | Admitting: Gastroenterology

## 2017-05-27 ENCOUNTER — Other Ambulatory Visit (HOSPITAL_BASED_OUTPATIENT_CLINIC_OR_DEPARTMENT_OTHER): Payer: Medicare Other

## 2017-05-27 ENCOUNTER — Ambulatory Visit (HOSPITAL_BASED_OUTPATIENT_CLINIC_OR_DEPARTMENT_OTHER): Payer: Medicare Other | Admitting: Internal Medicine

## 2017-05-27 ENCOUNTER — Encounter: Payer: Self-pay | Admitting: Internal Medicine

## 2017-05-27 ENCOUNTER — Telehealth: Payer: Self-pay | Admitting: Internal Medicine

## 2017-05-27 DIAGNOSIS — C50112 Malignant neoplasm of central portion of left female breast: Secondary | ICD-10-CM

## 2017-05-27 DIAGNOSIS — Z170 Estrogen receptor positive status [ER+]: Secondary | ICD-10-CM

## 2017-05-27 DIAGNOSIS — C50111 Malignant neoplasm of central portion of right female breast: Secondary | ICD-10-CM

## 2017-05-27 DIAGNOSIS — C182 Malignant neoplasm of ascending colon: Secondary | ICD-10-CM

## 2017-05-27 DIAGNOSIS — D638 Anemia in other chronic diseases classified elsewhere: Secondary | ICD-10-CM

## 2017-05-27 DIAGNOSIS — I10 Essential (primary) hypertension: Secondary | ICD-10-CM

## 2017-05-27 DIAGNOSIS — C3412 Malignant neoplasm of upper lobe, left bronchus or lung: Secondary | ICD-10-CM

## 2017-05-27 DIAGNOSIS — Z5111 Encounter for antineoplastic chemotherapy: Secondary | ICD-10-CM

## 2017-05-27 DIAGNOSIS — Z85038 Personal history of other malignant neoplasm of large intestine: Secondary | ICD-10-CM | POA: Diagnosis not present

## 2017-05-27 LAB — COMPREHENSIVE METABOLIC PANEL
ALBUMIN: 3.7 g/dL (ref 3.5–5.0)
ALT: 16 U/L (ref 0–55)
AST: 21 U/L (ref 5–34)
Alkaline Phosphatase: 77 U/L (ref 40–150)
Anion Gap: 6 mEq/L (ref 3–11)
BUN: 21.7 mg/dL (ref 7.0–26.0)
CHLORIDE: 107 meq/L (ref 98–109)
CO2: 25 meq/L (ref 22–29)
Calcium: 10.2 mg/dL (ref 8.4–10.4)
Creatinine: 1.3 mg/dL — ABNORMAL HIGH (ref 0.6–1.1)
EGFR: 40 mL/min/{1.73_m2} — ABNORMAL LOW (ref >90)
GLUCOSE: 103 mg/dL (ref 70–140)
POTASSIUM: 5.2 meq/L — AB (ref 3.5–5.1)
SODIUM: 138 meq/L (ref 136–145)
Total Bilirubin: 0.37 mg/dL (ref 0.20–1.20)
Total Protein: 6.7 g/dL (ref 6.4–8.3)

## 2017-05-27 LAB — CBC WITH DIFFERENTIAL/PLATELET
BASO%: 0.8 % (ref 0.0–2.0)
BASOS ABS: 0 10*3/uL (ref 0.0–0.1)
EOS%: 2.8 % (ref 0.0–7.0)
Eosinophils Absolute: 0.1 10*3/uL (ref 0.0–0.5)
HCT: 33.8 % — ABNORMAL LOW (ref 34.8–46.6)
HEMOGLOBIN: 10.7 g/dL — AB (ref 11.6–15.9)
LYMPH%: 22.7 % (ref 14.0–49.7)
MCH: 29.6 pg (ref 25.1–34.0)
MCHC: 31.7 g/dL (ref 31.5–36.0)
MCV: 93.6 fL (ref 79.5–101.0)
MONO#: 0.8 10*3/uL (ref 0.1–0.9)
MONO%: 17.6 % — AB (ref 0.0–14.0)
NEUT#: 2.6 10*3/uL (ref 1.5–6.5)
NEUT%: 56.1 % (ref 38.4–76.8)
Platelets: 229 10*3/uL (ref 145–400)
RBC: 3.61 10*6/uL — AB (ref 3.70–5.45)
RDW: 13.9 % (ref 11.2–14.5)
WBC: 4.7 10*3/uL (ref 3.9–10.3)
lymph#: 1.1 10*3/uL (ref 0.9–3.3)

## 2017-05-27 MED ORDER — OSIMERTINIB MESYLATE 80 MG PO TABS: 80.0000 mg | ORAL_TABLET | Freq: Every day | ORAL | 2 refills | Status: DC

## 2017-05-27 NOTE — Telephone Encounter (Signed)
Scheduled appt per 8/7 los - Gave patient AVS and calender per los.

## 2017-05-27 NOTE — Progress Notes (Signed)
[place] [phone]   Fax:([phone]) [phone]  OFFICE PROGRESS NOTE  Eber Hong, MD [address] [place] [address]  DIAGNOSIS:  1) metastatic non-small cell lung cancer, adenocarcinoma diagnosed in March 2012 with positive EGFR mutation in exon 21 (L858R). 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 2) adenocarcinoma of the ascending colon (T2, N0, M0) with MSI high diagnosed in March 2017. 3) history of breast adenocarcinoma.  PRIOR THERAPY:  1) Tarceva 150 mg by mouth daily status post 68 months of treatment. 2) Status post right colectomy with lymph node dissection in May 2017.  CURRENT THERAPY: 1) Tagrisso 80 mg by mouth daily started 11/09/2016. 2) observation for the history of colon adenocarcinoma. 3) Femara 2.5 mg by mouth daily for history of breast cancer.  INTERVAL HISTORY: Kristin [name] 76 y.o. female returns to the clinic today for follow-up visit. The patient is feeling fine today with no specific complaints. She continues to tolerate her treatment with Tagrisso and Femara fairly well. She denied having any chest pain, shortness of breath, cough or hemoptysis. She has no fever or chills. She denied having any weight loss or night sweats. She is here today for evaluation and repeat blood work.  MEDICAL HISTORY: Past Medical History:  Diagnosis Date  . Allergy   . Anemia   . Anemia of chronic disease 09/21/2016  . Anxiety   . Arthritis    arthritis- left hip  . Blood transfusion without reported diagnosis    transfusion- 3-4 yrs ago -found to be anemic on routine lab check  . Breast cancer ([place]) 2002   bilateral- bilateral mastectomies done.  . 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 CVA (cerebral infarction) 04/30/2013  . Encounter for antineoplastic chemotherapy 10/28/2016  . Family history of brain cancer   . Family history of breast cancer   . Family history of colon cancer 10/06/2009  . Family  history of colon cancer   . Family history of prostate cancer   . GERD (gastroesophageal reflux disease)   . Glaucoma   . History of bilateral mastectomy 05/20/2010  . Hypertension   . Lung cancer ([place]) dx'd 12/2010   last Ct scan "no lung cancer" showing 11'16 CT Chest Epic.  . Malignant neoplasm of ascending colon ([place]) 01/29/2016  . Stroke [facility])    2 yrs ago-no residual  . Tubular adenoma of colon 07/2011   colon polyps ans reoccurence with malignancy found    ALLERGIES:  is allergic to doxycycline and sulfa antibiotics.  MEDICATIONS:  Current Outpatient Prescriptions  Medication Sig Dispense Refill  . amLODipine (NORVASC) 5 MG tablet Take 5 mg by mouth daily.      . brimonidine-timolol (COMBIGAN) 0.2-0.5 % ophthalmic solution Place 1 drop into the right eye every 12 (twelve) hours.    . clopidogrel (PLAVIX) 75 MG tablet Take 75 mg by mouth daily.    . cycloSPORINE (RESTASIS) 0.05 % ophthalmic emulsion Place 1 drop into both eyes 2 (two) times daily.    Marland Kitchen escitalopram (LEXAPRO) 10 MG tablet Take 10 mg by mouth daily.     . famotidine (PEPCID) 40 MG tablet Take 40 mg by mouth daily.    Marland Kitchen FeFum-FePoly-FA-B Cmp-C-Biot (FOLIVANE-PLUS) CAPS TAKE 1 CAPSULE BY MOUTH DAILY 90 capsule 1  . letrozole (FEMARA) 2.5 MG tablet TAKE ONE TABLET BY MOUTH EACH DAY 30 tablet 3  . lisinopril (PRINIVIL,ZESTRIL) 20 MG tablet Take 1 tablet by mouth 2 (two) times daily.    Marland Kitchen  osimertinib mesylate (TAGRISSO) 80 MG tablet Take 1 tablet (80 mg total) by mouth daily. 30 tablet 2  . zolpidem (AMBIEN) 5 MG tablet Take 5 mg by mouth at bedtime as needed for sleep. Reported on 04/25/2016     Current Facility-Administered Medications  Medication Dose Route Frequency Provider Last Rate Last Dose  . 0.9 %  sodium chloride infusion  500 mL Intravenous Continuous Armbruster, Renelda Loma, MD        SURGICAL HISTORY:  Past Surgical History:  Procedure Laterality Date  . APPENDECTOMY    . cataracts Bilateral   .  COLON SURGERY  02/2016  . COLONOSCOPY    . EYE SURGERY    . LUNG SURGERY     Resection -" not done. Pt . tx with Tarceva  . MASTECTOMY Bilateral   . MEDIASTINOSCOPY N/A 10/02/2016   Procedure: MEDIASTINOSCOPY;  Surgeon: [name], MD;  Location: [facility] OR;  Service: Thoracic;  Laterality: N/A;  . RECONSTRUCTION BREAST W/ TRAM FLAP Bilateral    bilaterally  . TEE WITHOUT CARDIOVERSION N/A 06/04/2013   Procedure: TRANSESOPHAGEAL ECHOCARDIOGRAM (TEE);  Surgeon: Jolaine Artist, MD;  Location: [facility] ENDOSCOPY;  Service: Cardiovascular;  Laterality: N/A;  . TONSILLECTOMY    . TOTAL HIP ARTHROPLASTY Left 08/07/2016   Procedure: LEFT TOTAL HIP ARTHROPLASTY ANTERIOR APPROACH;  Surgeon: Gaynelle Arabian, MD;  Location: [facility] ORS;  Service: Orthopedics;  Laterality: Left;    REVIEW OF SYSTEMS:  A comprehensive review of systems was negative except for: Constitutional: positive for fatigue Musculoskeletal: positive for arthralgias   PHYSICAL EXAMINATION: General appearance: alert, cooperative, fatigued and no distress Head: Normocephalic, without obvious abnormality, atraumatic Neck: no adenopathy, no JVD, supple, symmetrical, trachea midline and thyroid not enlarged, symmetric, no tenderness/mass/nodules Lymph nodes: Cervical, supraclavicular, and axillary nodes normal. Resp: clear to auscultation bilaterally Back: symmetric, no curvature. ROM normal. No CVA tenderness. Cardio: regular rate and rhythm, S1, S2 normal, no murmur, click, rub or gallop GI: soft, non-tender; bowel sounds normal; no masses,  no organomegaly Extremities: extremities normal, atraumatic, no cyanosis or edema  ECOG PERFORMANCE STATUS: 1 - Symptomatic but completely ambulatory  Blood pressure (!) 160/52, pulse 62, temperature 97.7 F (36.5 C), temperature source Oral, resp. rate 19, height 5' 8"  (1.727 m), weight 145 lb (65.8 kg), SpO2 100 %.  LABORATORY DATA: Lab Results  Component Value Date   WBC 4.7 05/27/2017     HGB 10.7 (L) 05/27/2017   HCT 33.8 (L) 05/27/2017   MCV 93.6 05/27/2017   PLT 229 05/27/2017      Chemistry      Component Value Date/Time   NA 140 04/28/2017 1330   K 4.3 04/28/2017 1330   CL 105 09/30/2016 1100   CL 107 04/05/2013 0754   CO2 26 04/28/2017 1330   BUN 13.9 04/28/2017 1330   CREATININE 1.3 (H) 04/28/2017 1330      Component Value Date/Time   CALCIUM 10.0 04/28/2017 1330   ALKPHOS 76 04/28/2017 1330   AST 18 04/28/2017 1330   ALT 16 04/28/2017 1330   BILITOT 0.46 04/28/2017 1330       RADIOGRAPHIC STUDIES: Ct Chest W Contrast  Result Date: 04/29/2017 CLINICAL DATA:  Lung cancer diagnosed 2012, bilateral breast cancer diagnosed 2002, colon cancer diagnosed 2017. Status post bilateral mastectomy, Femara in progress, Tagrisso in progress. EXAM: CT CHEST, ABDOMEN, AND PELVIS WITH CONTRAST TECHNIQUE: Multidetector CT imaging of the chest, abdomen and pelvis was performed following the standard protocol during bolus administration of intravenous  contrast. CONTRAST:  40m ISOVUE-300 IOPAMIDOL (ISOVUE-300) INJECTION 61% COMPARISON:  01/10/2017 FINDINGS: CT CHEST FINDINGS Cardiovascular: The heart is normal in size. No pericardial effusion. Mild coronary atherosclerosis in the LAD. No evidence of thoracic aortic aneurysm. Atherosclerotic calcifications of the aortic arch. Mediastinum/Nodes: No suspicious mediastinal, hilar, or axillary lymphadenopathy. Visualized thyroid is heterogeneous/ nodular. Lungs/Pleura: Status post left upper lobe wedge resection. No suspicious pulmonary nodules. No focal consolidation. No pleural effusion or pneumothorax. Musculoskeletal: Status post bilateral mastectomy. Mild degenerative changes of the lower thoracic spine. CT ABDOMEN PELVIS FINDINGS Hepatobiliary: 2.9 cm cyst in segment 8 of the liver (series 2/ image 50). 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 No intrahepatic or extrahepatic ductal dilatation. Pancreas: Within normal limits. Prior  pancreatic head lesion is less conspicuous on the current study. Spleen: Within normal limits. Adrenals/Urinary Tract: Adrenal glands are within normal limits. Kidneys are within normal limits.  No hydronephrosis. Bladder is underdistended but unremarkable. Stomach/Bowel: Stomach is notable for a large hiatal hernia/inverted intrathoracic stomach. No evidence of bowel obstruction. Status post right hemicolectomy with appendectomy. Mild sigmoid diverticulosis, without evidence of diverticulitis. Vascular/Lymphatic: No evidence of abdominal aortic aneurysm. Atherosclerotic calcifications of the abdominal aorta and branch vessels. No suspicious abdominopelvic lymphadenopathy. Reproductive: Uterus is notable for calcified uterine fibroids. No adnexal masses. Other: No abdominopelvic ascites. Musculoskeletal: Mild degenerative changes the lower lumbar spine. IMPRESSION: Status post bilateral mastectomy. Status post left upper lobe wedge resection. Status post right hemicolectomy. No evidence of recurrent or metastatic disease. Electronically Signed   By: SJulian HyM.D.   On: 04/29/2017 08:24   Ct Abdomen Pelvis W Contrast  Result Date: 04/29/2017 CLINICAL DATA:  Lung cancer diagnosed 2012, bilateral breast cancer diagnosed 2002, colon cancer diagnosed 2017. Status post bilateral mastectomy, Femara in progress, Tagrisso in progress. EXAM: CT CHEST, ABDOMEN, AND PELVIS WITH CONTRAST TECHNIQUE: Multidetector CT imaging of the chest, abdomen and pelvis was performed following the standard protocol during bolus administration of intravenous contrast. CONTRAST:  885mISOVUE-300 IOPAMIDOL (ISOVUE-300) INJECTION 61% COMPARISON:  01/10/2017 FINDINGS: CT CHEST FINDINGS Cardiovascular: The heart is normal in size. No pericardial effusion. Mild coronary atherosclerosis in the LAD. No evidence of thoracic aortic aneurysm. Atherosclerotic calcifications of the aortic arch. Mediastinum/Nodes: No suspicious mediastinal,  hilar, or axillary lymphadenopathy. Visualized thyroid is heterogeneous/ nodular. Lungs/Pleura: Status post left upper lobe wedge resection. No suspicious pulmonary nodules. No focal consolidation. No pleural effusion or pneumothorax. Musculoskeletal: Status post bilateral mastectomy. Mild degenerative changes of the lower thoracic spine. CT ABDOMEN PELVIS FINDINGS Hepatobiliary: 2.9 cm cyst in segment 8 of the liver (series 2/ image 50). 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 No intrahepatic or extrahepatic ductal dilatation. Pancreas: Within normal limits. Prior pancreatic head lesion is less conspicuous on the current study. Spleen: Within normal limits. Adrenals/Urinary Tract: Adrenal glands are within normal limits. Kidneys are within normal limits.  No hydronephrosis. Bladder is underdistended but unremarkable. Stomach/Bowel: Stomach is notable for a large hiatal hernia/inverted intrathoracic stomach. No evidence of bowel obstruction. Status post right hemicolectomy with appendectomy. Mild sigmoid diverticulosis, without evidence of diverticulitis. Vascular/Lymphatic: No evidence of abdominal aortic aneurysm. Atherosclerotic calcifications of the abdominal aorta and branch vessels. No suspicious abdominopelvic lymphadenopathy. Reproductive: Uterus is notable for calcified uterine fibroids. No adnexal masses. Other: No abdominopelvic ascites. Musculoskeletal: Mild degenerative changes the lower lumbar spine. IMPRESSION: Status post bilateral mastectomy. Status post left upper lobe wedge resection. Status post right hemicolectomy. No evidence of recurrent or metastatic disease. Electronically Signed   By: SrJulian Hy.D.   On: 04/29/2017 08:24  ASSESSMENT AND PLAN:  This is a very pleasant 76 years old white female with metastatic non-small cell lung cancer, adenocarcinoma diagnosed in March 2012 with positive EGFR mutation status post 68 months of treatment with Tarceva discontinued secondary to  disease progression and development of EGFR T790M resistant mutation. The patient is currently on treatment with Tagrisso 80 mg by mouth daily status post 7 months. She has been tolerating this treatment fairly well. I recommended for the patient to continue her treatment with Tagrisso as a scheduled. I will see her back for follow-up visit in one month's for evaluation and repeat blood work. She will also continue her current treatment with Femara for the breast cancer. For hypertension, she has a change in her blood pressure medication was increase in the dose of lisinopril. Her blood pressure is much better at home. She was advised to continue monitor it closely. The patient was advised to call immediately if she has any concerning symptoms in the interval. The patient voices understanding of current disease status and treatment options and is in agreement with the current care plan. All questions were answered. The patient knows to call the clinic with any problems, questions or concerns. We can certainly see the patient much sooner if necessary. I spent 10 minutes counseling the patient face to face. The total time spent in the appointment was 15 minutes.  Disclaimer: This note was dictated with voice recognition software. Similar sounding words can inadvertently be transcribed and may not be corrected upon review.

## 2017-06-12 ENCOUNTER — Other Ambulatory Visit: Payer: Self-pay | Admitting: Internal Medicine

## 2017-06-12 DIAGNOSIS — C182 Malignant neoplasm of ascending colon: Secondary | ICD-10-CM

## 2017-06-26 ENCOUNTER — Other Ambulatory Visit (HOSPITAL_BASED_OUTPATIENT_CLINIC_OR_DEPARTMENT_OTHER): Payer: Medicare Other

## 2017-06-26 ENCOUNTER — Ambulatory Visit (HOSPITAL_BASED_OUTPATIENT_CLINIC_OR_DEPARTMENT_OTHER): Payer: Medicare Other | Admitting: Internal Medicine

## 2017-06-26 ENCOUNTER — Telehealth: Payer: Self-pay

## 2017-06-26 VITALS — BP 157/46 | HR 59 | Temp 97.9°F | Resp 19 | Ht 68.0 in | Wt 142.7 lb

## 2017-06-26 DIAGNOSIS — Z85038 Personal history of other malignant neoplasm of large intestine: Secondary | ICD-10-CM | POA: Diagnosis not present

## 2017-06-26 DIAGNOSIS — Z853 Personal history of malignant neoplasm of breast: Secondary | ICD-10-CM | POA: Diagnosis not present

## 2017-06-26 DIAGNOSIS — C3412 Malignant neoplasm of upper lobe, left bronchus or lung: Secondary | ICD-10-CM

## 2017-06-26 DIAGNOSIS — C50112 Malignant neoplasm of central portion of left female breast: Secondary | ICD-10-CM

## 2017-06-26 DIAGNOSIS — C182 Malignant neoplasm of ascending colon: Secondary | ICD-10-CM

## 2017-06-26 DIAGNOSIS — Z5111 Encounter for antineoplastic chemotherapy: Secondary | ICD-10-CM

## 2017-06-26 DIAGNOSIS — C50111 Malignant neoplasm of central portion of right female breast: Secondary | ICD-10-CM

## 2017-06-26 DIAGNOSIS — D638 Anemia in other chronic diseases classified elsewhere: Secondary | ICD-10-CM

## 2017-06-26 DIAGNOSIS — C3490 Malignant neoplasm of unspecified part of unspecified bronchus or lung: Secondary | ICD-10-CM

## 2017-06-26 DIAGNOSIS — Z170 Estrogen receptor positive status [ER+]: Secondary | ICD-10-CM

## 2017-06-26 LAB — COMPREHENSIVE METABOLIC PANEL
ALT: 10 U/L (ref 0–55)
ANION GAP: 6 meq/L (ref 3–11)
AST: 17 U/L (ref 5–34)
Albumin: 3.4 g/dL — ABNORMAL LOW (ref 3.5–5.0)
Alkaline Phosphatase: 71 U/L (ref 40–150)
BILIRUBIN TOTAL: 0.22 mg/dL (ref 0.20–1.20)
BUN: 16.6 mg/dL (ref 7.0–26.0)
CALCIUM: 9.9 mg/dL (ref 8.4–10.4)
CHLORIDE: 107 meq/L (ref 98–109)
CO2: 25 mEq/L (ref 22–29)
CREATININE: 1.2 mg/dL — AB (ref 0.6–1.1)
EGFR: 42 mL/min/{1.73_m2} — AB (ref >90)
Glucose: 101 mg/dl (ref 70–140)
Potassium: 4.8 mEq/L (ref 3.5–5.1)
Sodium: 139 mEq/L (ref 136–145)
TOTAL PROTEIN: 6.6 g/dL (ref 6.4–8.3)

## 2017-06-26 LAB — CBC WITH DIFFERENTIAL/PLATELET
BASO%: 0.7 % (ref 0.0–2.0)
Basophils Absolute: 0 10*3/uL (ref 0.0–0.1)
EOS ABS: 0.1 10*3/uL (ref 0.0–0.5)
EOS%: 2.4 % (ref 0.0–7.0)
HEMATOCRIT: 33.3 % — AB (ref 34.8–46.6)
HGB: 10.6 g/dL — ABNORMAL LOW (ref 11.6–15.9)
LYMPH#: 1.1 10*3/uL (ref 0.9–3.3)
LYMPH%: 23.5 % (ref 14.0–49.7)
MCH: 29.4 pg (ref 25.1–34.0)
MCHC: 31.8 g/dL (ref 31.5–36.0)
MCV: 92.5 fL (ref 79.5–101.0)
MONO#: 0.7 10*3/uL (ref 0.1–0.9)
MONO%: 14.2 % — ABNORMAL HIGH (ref 0.0–14.0)
NEUT#: 2.7 10*3/uL (ref 1.5–6.5)
NEUT%: 59.2 % (ref 38.4–76.8)
PLATELETS: 250 10*3/uL (ref 145–400)
RBC: 3.6 10*6/uL — ABNORMAL LOW (ref 3.70–5.45)
RDW: 13.4 % (ref 11.2–14.5)
WBC: 4.6 10*3/uL (ref 3.9–10.3)

## 2017-06-26 NOTE — Progress Notes (Signed)
[place] Telephone:([phone]) [phone]   Fax:(336) [phone]  OFFICE PROGRESS NOTE  Eber Hong, MD [address] 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 2) adenocarcinoma of the ascending colon (T2, N0, M0) with MSI high diagnosed in March 2017. 3) history of breast adenocarcinoma.  PRIOR THERAPY:  1) Tarceva 150 mg by mouth daily status post 68 months of treatment. 2) Status post right colectomy with lymph node dissection in May 2017.  CURRENT THERAPY: 1) Tagrisso 80 mg by mouth daily started 11/09/2016. 2) observation for the history of colon adenocarcinoma. 3) Femara 2.5 mg by mouth daily for history of breast cancer.  INTERVAL HISTORY: Kristin [name] 76 y.o. female returns to the clinic today for follow-up visit accompanied by her husband. The patient is feeling fine today with no specific complaints. She denied having any fatigue or weakness. She denied having any chest pain, shortness of breath, cough or hemoptysis. She has no significant weight loss or night sweats. She has no nausea, vomiting, diarrhea or constipation. She is here today for evaluation and repeat blood work.  MEDICAL HISTORY: Past Medical History:  Diagnosis Date  . Allergy   . Anemia   . Anemia of chronic disease 09/21/2016  . Anxiety   . Arthritis    arthritis- left hip  . Blood transfusion without reported diagnosis    transfusion- 3-4 yrs ago -found to be anemic on routine lab check  . Breast cancer ([place]) 2002   bilateral- bilateral mastectomies done.  . 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 CVA (cerebral infarction) 04/30/2013  . Encounter for antineoplastic chemotherapy 10/28/2016  . Family history of brain cancer   . Family history of breast cancer   . Family history of colon cancer  10/06/2009  . Family history of colon cancer   . Family history of prostate cancer   . GERD (gastroesophageal reflux disease)   . Glaucoma   . History of bilateral mastectomy 05/20/2010  . Hypertension   . Lung cancer ([place]) dx'd 12/2010   last Ct scan "no lung cancer" showing 11'16 CT Chest Epic.  . Malignant neoplasm of ascending colon ([place]) 01/29/2016  . Stroke [facility])    2 yrs ago-no residual  . Tubular adenoma of colon 07/2011   colon polyps ans reoccurence with malignancy found    ALLERGIES:  is allergic to doxycycline and sulfa antibiotics.  MEDICATIONS:  Current Outpatient Prescriptions  Medication Sig Dispense Refill  . amLODipine (NORVASC) 5 MG tablet Take 5 mg by mouth daily.      . brimonidine-timolol (COMBIGAN) 0.2-0.5 % ophthalmic solution Place 1 drop into the right eye every 12 (twelve) hours.    . clopidogrel (PLAVIX) 75 MG tablet Take 75 mg by mouth daily.    . cycloSPORINE (RESTASIS) 0.05 % ophthalmic emulsion Place 1 drop into both eyes 2 (two) times daily.    Marland Kitchen escitalopram (LEXAPRO) 10 MG tablet Take 10 mg by mouth daily.     . famotidine (PEPCID) 40 MG tablet Take 40 mg by mouth daily.    Marland Kitchen FeFum-FePoly-FA-B Cmp-C-Biot (FOLIVANE-PLUS) CAPS TAKE ONE CAPSULE BY MOUTH EACH DAY 90 capsule 2  . letrozole (FEMARA) 2.5 MG tablet TAKE ONE TABLET BY MOUTH EACH DAY 30 tablet 3  . lisinopril (PRINIVIL,ZESTRIL) 20 MG tablet Take 1 tablet by mouth 2 (two)  times daily.    Marland Kitchen osimertinib mesylate (TAGRISSO) 80 MG tablet Take 1 tablet (80 mg total) by mouth daily. 30 tablet 2  . zolpidem (AMBIEN) 5 MG tablet Take 5 mg by mouth at bedtime as needed for sleep. Reported on 04/25/2016     Current Facility-Administered Medications  Medication Dose Route Frequency Provider Last Rate Last Dose  . 0.9 %  sodium chloride infusion  500 mL Intravenous Continuous Armbruster, Carlota Raspberry, MD        SURGICAL HISTORY:  Past Surgical History:  Procedure Laterality Date  . APPENDECTOMY    .  cataracts Bilateral   . COLON SURGERY  02/2016  . COLONOSCOPY    . EYE SURGERY    . LUNG SURGERY     Resection -" not done. Pt . tx with Tarceva  . MASTECTOMY Bilateral   . MEDIASTINOSCOPY N/A 10/02/2016   Procedure: MEDIASTINOSCOPY;  Surgeon: [name], MD;  Location: [facility] OR;  Service: Thoracic;  Laterality: N/A;  . RECONSTRUCTION BREAST W/ TRAM FLAP Bilateral    bilaterally  . TEE WITHOUT CARDIOVERSION N/A 06/04/2013   Procedure: TRANSESOPHAGEAL ECHOCARDIOGRAM (TEE);  Surgeon: Jolaine Artist, MD;  Location[facility] ENDOSCOPY;  Service: Cardiovascular;  Laterality: N/A;  . TONSILLECTOMY    . TOTAL HIP ARTHROPLASTY Left 08/07/2016   Procedure: LEFT TOTAL HIP ARTHROPLASTY ANTERIOR APPROACH;  Surgeon: Gaynelle Arabian, MD;  Location: [facility] ORS;  Service: Orthopedics;  Laterality: Left;    REVIEW OF SYSTEMS:  A comprehensive review of systems was negative.   PHYSICAL EXAMINATION: General appearance: alert, cooperative and no distress Head: Normocephalic, without obvious abnormality, atraumatic Neck: no adenopathy, no JVD, supple, symmetrical, trachea midline and thyroid not enlarged, symmetric, no tenderness/mass/nodules Lymph nodes: Cervical, supraclavicular, and axillary nodes normal. Resp: clear to auscultation bilaterally Back: symmetric, no curvature. ROM normal. No CVA tenderness. Cardio: regular rate and rhythm, S1, S2 normal, no murmur, click, rub or gallop GI: soft, non-tender; bowel sounds normal; no masses,  no organomegaly Extremities: extremities normal, atraumatic, no cyanosis or edema  ECOG PERFORMANCE STATUS: 1 - Symptomatic but completely ambulatory  Blood pressure (!) 157/46, pulse (!) 59, temperature 97.9 F (36.6 C), temperature source Oral, resp. rate 19, height _0  (1.727 m), weight 142 lb 11.2 oz (64.7 kg), SpO2 100 %.  LABORATORY DATA: Lab Results  Component Value Date   WBC 4.6 06/26/2017   HGB 10.6 (L) 06/26/2017   HCT 33.3 (L) 06/26/2017   MCV 92.5  06/26/2017   PLT 250 06/26/2017      Chemistry      Component Value Date/Time   NA 139 06/26/2017 1123   K 4.8 06/26/2017 1123   CL 105 09/30/2016 1100   CL 107 04/05/2013 0754   CO2 25 06/26/2017 1123   BUN 16.6 06/26/2017 1123   CREATININE 1.2 (H) 06/26/2017 1123      Component Value Date/Time   CALCIUM 9.9 06/26/2017 1123   ALKPHOS 71 06/26/2017 1123   AST 17 06/26/2017 1123   ALT 10 06/26/2017 1123   BILITOT 0.22 06/26/2017 1123       RADIOGRAPHIC STUDIES: No results found.  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 The patient is currently on treatment with Tagrisso 80 mg by mouth daily  status post 8 months.  The patient continues to tolerate her treatment well with no significant adverse effects. I recommended for her to continue her current treatment with Tagrisso with the same dose. I would see her back for follow-up visit in one month after repeating CT scan of the chest, abdomen and pelvis for restaging of her disease. She will also continue her current treatment with Femara for the breast cancer. For hypertension, she has a change in her blood pressure medication was increase in the dose of lisinopril. Her blood pressure is much better at home. She was advised to continue monitor it closely. The patient was advised to call immediately if she has any concerning symptoms in the interval. The patient voices understanding of current disease status and treatment options and is in agreement with the current care plan. All questions were answered. The patient knows to call the clinic with any problems, questions or concerns. We can certainly see the patient much sooner if necessary. I spent 10  minutes counseling the patient face to face. The total time spent in the appointment was 15 minutes.  Disclaimer: This note was dictated with voice recognition software. Similar sounding words can inadvertently be transcribed and may not be corrected upon review.

## 2017-06-26 NOTE — Telephone Encounter (Signed)
Gave patient avs and calender for 10/4 per los

## 2017-06-27 ENCOUNTER — Encounter: Payer: Self-pay | Admitting: Internal Medicine

## 2017-07-24 ENCOUNTER — Ambulatory Visit: Payer: Self-pay | Admitting: Internal Medicine

## 2017-07-24 ENCOUNTER — Other Ambulatory Visit (HOSPITAL_BASED_OUTPATIENT_CLINIC_OR_DEPARTMENT_OTHER): Payer: Medicare Other

## 2017-07-24 ENCOUNTER — Ambulatory Visit (HOSPITAL_COMMUNITY)
Admission: RE | Admit: 2017-07-24 | Discharge: 2017-07-24 | Disposition: A | Discharge status: Home / Self Care | Payer: Medicare Other | Source: Ambulatory Visit | Attending: Internal Medicine | Admitting: Internal Medicine

## 2017-07-24 DIAGNOSIS — Z902 Acquired absence of lung [part of]: Secondary | ICD-10-CM | POA: Diagnosis not present

## 2017-07-24 DIAGNOSIS — I700 Atherosclerosis of aorta: Secondary | ICD-10-CM | POA: Diagnosis not present

## 2017-07-24 DIAGNOSIS — C182 Malignant neoplasm of ascending colon: Secondary | ICD-10-CM | POA: Diagnosis present

## 2017-07-24 DIAGNOSIS — I2510 Atherosclerotic heart disease of native coronary artery without angina pectoris: Secondary | ICD-10-CM | POA: Insufficient documentation

## 2017-07-24 DIAGNOSIS — Z9013 Acquired absence of bilateral breasts and nipples: Secondary | ICD-10-CM | POA: Diagnosis not present

## 2017-07-24 DIAGNOSIS — Z5111 Encounter for antineoplastic chemotherapy: Secondary | ICD-10-CM

## 2017-07-24 DIAGNOSIS — C3412 Malignant neoplasm of upper lobe, left bronchus or lung: Secondary | ICD-10-CM

## 2017-07-24 DIAGNOSIS — C3490 Malignant neoplasm of unspecified part of unspecified bronchus or lung: Secondary | ICD-10-CM

## 2017-07-24 DIAGNOSIS — Z9049 Acquired absence of other specified parts of digestive tract: Secondary | ICD-10-CM | POA: Insufficient documentation

## 2017-07-24 DIAGNOSIS — K449 Diaphragmatic hernia without obstruction or gangrene: Secondary | ICD-10-CM | POA: Diagnosis not present

## 2017-07-24 LAB — COMPREHENSIVE METABOLIC PANEL
ALT: 15 U/L (ref 0–55)
AST: 21 U/L (ref 5–34)
Albumin: 3.7 g/dL (ref 3.5–5.0)
Alkaline Phosphatase: 76 U/L (ref 40–150)
Anion Gap: 7 mEq/L (ref 3–11)
BUN: 18.3 mg/dL (ref 7.0–26.0)
CALCIUM: 10 mg/dL (ref 8.4–10.4)
CHLORIDE: 107 meq/L (ref 98–109)
CO2: 25 meq/L (ref 22–29)
Creatinine: 1.3 mg/dL — ABNORMAL HIGH (ref 0.6–1.1)
EGFR: 40 mL/min/{1.73_m2} — ABNORMAL LOW (ref >90)
Glucose: 99 mg/dl (ref 70–140)
POTASSIUM: 4.6 meq/L (ref 3.5–5.1)
SODIUM: 140 meq/L (ref 136–145)
Total Bilirubin: 0.38 mg/dL (ref 0.20–1.20)
Total Protein: 6.8 g/dL (ref 6.4–8.3)

## 2017-07-24 LAB — CBC WITH DIFFERENTIAL/PLATELET
BASO%: 1.3 % (ref 0.0–2.0)
BASOS ABS: 0.1 10*3/uL (ref 0.0–0.1)
EOS%: 3.4 % (ref 0.0–7.0)
Eosinophils Absolute: 0.2 10*3/uL (ref 0.0–0.5)
HEMATOCRIT: 32.8 % — AB (ref 34.8–46.6)
HGB: 10.9 g/dL — ABNORMAL LOW (ref 11.6–15.9)
LYMPH%: 16.8 % (ref 14.0–49.7)
MCH: 30.1 pg (ref 25.1–34.0)
MCHC: 33.3 g/dL (ref 31.5–36.0)
MCV: 90.2 fL (ref 79.5–101.0)
MONO#: 0.7 10*3/uL (ref 0.1–0.9)
MONO%: 14.1 % — AB (ref 0.0–14.0)
NEUT#: 3 10*3/uL (ref 1.5–6.5)
NEUT%: 64.4 % (ref 38.4–76.8)
Platelets: 259 10*3/uL (ref 145–400)
RBC: 3.63 10*6/uL — AB (ref 3.70–5.45)
RDW: 14.6 % — ABNORMAL HIGH (ref 11.2–14.5)
WBC: 4.7 10*3/uL (ref 3.9–10.3)
lymph#: 0.8 10*3/uL — ABNORMAL LOW (ref 0.9–3.3)

## 2017-07-24 MED ORDER — IOPAMIDOL (ISOVUE-300) INJECTION 61%
100.0000 mL | Freq: Once | INTRAVENOUS | Status: AC | PRN
  Administered 2017-07-24: 80 mL via INTRAVENOUS

## 2017-07-24 MED ORDER — IOPAMIDOL (ISOVUE-300) INJECTION 61%
INTRAVENOUS | Status: AC
  Filled 2017-07-24: qty 100

## 2017-07-28 ENCOUNTER — Telehealth: Payer: Self-pay | Admitting: Internal Medicine

## 2017-07-28 ENCOUNTER — Ambulatory Visit (HOSPITAL_BASED_OUTPATIENT_CLINIC_OR_DEPARTMENT_OTHER): Payer: Medicare Other | Admitting: Internal Medicine

## 2017-07-28 ENCOUNTER — Encounter: Payer: Self-pay | Admitting: Internal Medicine

## 2017-07-28 VITALS — BP 160/53 | HR 58 | Temp 97.7°F | Resp 18 | Ht 68.0 in | Wt 142.2 lb

## 2017-07-28 DIAGNOSIS — I10 Essential (primary) hypertension: Secondary | ICD-10-CM

## 2017-07-28 DIAGNOSIS — C3412 Malignant neoplasm of upper lobe, left bronchus or lung: Secondary | ICD-10-CM | POA: Diagnosis present

## 2017-07-28 DIAGNOSIS — Z85038 Personal history of other malignant neoplasm of large intestine: Secondary | ICD-10-CM | POA: Diagnosis not present

## 2017-07-28 DIAGNOSIS — D638 Anemia in other chronic diseases classified elsewhere: Secondary | ICD-10-CM

## 2017-07-28 DIAGNOSIS — Z853 Personal history of malignant neoplasm of breast: Secondary | ICD-10-CM

## 2017-07-28 DIAGNOSIS — C50112 Malignant neoplasm of central portion of left female breast: Secondary | ICD-10-CM

## 2017-07-28 DIAGNOSIS — Z5111 Encounter for antineoplastic chemotherapy: Secondary | ICD-10-CM

## 2017-07-28 DIAGNOSIS — Z170 Estrogen receptor positive status [ER+]: Secondary | ICD-10-CM

## 2017-07-28 DIAGNOSIS — C50111 Malignant neoplasm of central portion of right female breast: Secondary | ICD-10-CM

## 2017-07-28 NOTE — Telephone Encounter (Signed)
Scheduled appt per 10/8 los - Gave patient AVS and calender per los.  

## 2017-07-28 NOTE — Progress Notes (Signed)
[facility] [phone]   Fax:([phone]  OFFICE PROGRESS NOTE  Eber Hong, MD [address] [place] [address]  DIAGNOSIS:  1) metastatic non-small cell lung cancer, adenocarcinoma diagnosed in March 2012 with positive EGFR mutation in exon 21 (L858R). 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 2) adenocarcinoma of the ascending colon (T2, N0, M0) with MSI high diagnosed in March 2017. 3) history of breast adenocarcinoma.  PRIOR THERAPY:  1) Tarceva 150 mg by mouth daily status post 68 months of treatment. 2) Status post right colectomy with lymph node dissection in May 2017.  CURRENT THERAPY: 1) Tagrisso 80 mg by mouth daily started 11/09/2016. 2) observation for the history of colon adenocarcinoma. 3) Femara 2.5 mg by mouth daily for history of breast cancer.  INTERVAL HISTORY: Kristin [name] 76 y.o. female returns to the clinic today for follow-up visit accompanied by her daughter. The patient is feeling fine today with no specific complaints. She had a lot of diarrhea after the oral contrast for the CT scan last week. She denied having any recent weight loss or night sweats. She has no nausea, vomiting or constipation. She denied having any chest pain, shortness of breath, cough or hemoptysis. The patient continues to tolerate her treatment with Tagrisso fairly well. She had repeat CT scan of the chest, abdomen and pelvis performed recently and she is here for evaluation and discussion of her scan results.   MEDICAL HISTORY: Past Medical History:  Diagnosis Date  . Allergy   . Anemia   . Anemia of chronic disease 09/21/2016  . Anxiety   . Arthritis    arthritis- left hip  . Blood transfusion without reported diagnosis    transfusion- 3-4 yrs ago -found to be anemic on routine lab check  . Breast cancer ([place]) 2002   bilateral- bilateral mastectomies done.  . 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 CVA  (cerebral infarction) 04/30/2013  . Encounter for antineoplastic chemotherapy 10/28/2016  . Family history of brain cancer   . Family history of breast cancer   . Family history of colon cancer 10/06/2009  . Family history of colon cancer   . Family history of prostate cancer   . GERD (gastroesophageal reflux disease)   . Glaucoma   . History of bilateral mastectomy 05/20/2010  . Hypertension   . Lung cancer ([place]) dx'd 12/2010   last Ct scan "no lung cancer" showing 11'16 CT Chest Epic.  . Malignant neoplasm of ascending colon ([place]) 01/29/2016  . Stroke [facility])    2 yrs ago-no residual  . Tubular adenoma of colon 07/2011   colon polyps ans reoccurence with malignancy found    ALLERGIES:  is allergic to doxycycline and sulfa antibiotics.  MEDICATIONS:  Current Outpatient Prescriptions  Medication Sig Dispense Refill  . amLODipine (NORVASC) 5 MG tablet Take 5 mg by mouth daily. Per Pt -she takes 2.5 mg in am and 5 mg in pm    . brimonidine-timolol (COMBIGAN) 0.2-0.5 % ophthalmic solution Place 1 drop into the right eye every 12 (twelve) hours.    . clopidogrel (PLAVIX) 75 MG tablet Take 75 mg by mouth daily.    . cycloSPORINE (RESTASIS) 0.05 % ophthalmic emulsion Place 1 drop into both eyes 2 (two) times daily.    Marland Kitchen escitalopram (LEXAPRO) 10 MG tablet Take 10 mg by mouth daily.     . famotidine (PEPCID) 40 MG tablet Take 40 mg by mouth daily.    Marland Kitchen  FeFum-FePoly-FA-B Cmp-C-Biot (FOLIVANE-PLUS) CAPS TAKE ONE CAPSULE BY MOUTH EACH DAY 90 capsule 2  . letrozole (FEMARA) 2.5 MG tablet TAKE ONE TABLET BY MOUTH EACH DAY 30 tablet 3  . lisinopril (PRINIVIL,ZESTRIL) 20 MG tablet Take 1 tablet by mouth 2 (two) times daily.    Marland Kitchen osimertinib mesylate (TAGRISSO) 80 MG tablet Take 1 tablet (80 mg total) by mouth daily. 30 tablet 2  . zolpidem (AMBIEN) 5 MG tablet Take 5 mg by mouth at bedtime as needed for sleep. Reported on 04/25/2016     Current Facility-Administered Medications  Medication Dose Route  Frequency Provider Last Rate Last Dose  . 0.9 %  sodium chloride infusion  500 mL Intravenous Continuous Armbruster, Carlota Raspberry, MD        SURGICAL HISTORY:  Past Surgical History:  Procedure Laterality Date  . APPENDECTOMY    . cataracts Bilateral   . COLON SURGERY  02/2016  . COLONOSCOPY    . EYE SURGERY    . LUNG SURGERY     Resection -" not done. Pt . tx with Tarceva  . MASTECTOMY Bilateral   . MEDIASTINOSCOPY N/A 10/02/2016   Procedure: MEDIASTINOSCOPY;  Surgeon: [name], MD;  Location: [facility] OR;  Service: Thoracic;  Laterality: N/A;  . RECONSTRUCTION BREAST W/ TRAM FLAP Bilateral    bilaterally  . TEE WITHOUT CARDIOVERSION N/A 06/04/2013   Procedure: TRANSESOPHAGEAL ECHOCARDIOGRAM (TEE);  Surgeon: Jolaine Artist, MD;  Location: [facility] ENDOSCOPY;  Service: Cardiovascular;  Laterality: N/A;  . TONSILLECTOMY    . TOTAL HIP ARTHROPLASTY Left 08/07/2016   Procedure: LEFT TOTAL HIP ARTHROPLASTY ANTERIOR APPROACH;  Surgeon: Gaynelle Arabian, MD;  Location: [facility] ORS;  Service: Orthopedics;  Laterality: Left;    REVIEW OF SYSTEMS:  Constitutional: negative Eyes: negative Ears, nose, mouth, throat, and face: negative Respiratory: negative Cardiovascular: negative Gastrointestinal: positive for diarrhea Genitourinary:negative Integument/breast: negative Hematologic/lymphatic: negative Musculoskeletal:negative Neurological: negative Behavioral/Psych: negative Endocrine: negative Allergic/Immunologic: negative   PHYSICAL EXAMINATION: General appearance: alert, cooperative and no distress Head: Normocephalic, without obvious abnormality, atraumatic Neck: no adenopathy, no JVD, supple, symmetrical, trachea midline and thyroid not enlarged, symmetric, no tenderness/mass/nodules Lymph nodes: Cervical, supraclavicular, and axillary nodes normal. Resp: clear to auscultation bilaterally Back: symmetric, no curvature. ROM normal. No CVA tenderness. Cardio: regular rate and rhythm,  S1, S2 normal, no murmur, click, rub or gallop GI: soft, non-tender; bowel sounds normal; no masses,  no organomegaly Extremities: extremities normal, atraumatic, no cyanosis or edema Neurologic: Alert and oriented X 3, normal strength and tone. Normal symmetric reflexes. Normal coordination and gait  ECOG PERFORMANCE STATUS: 1 - Symptomatic but completely ambulatory  Blood pressure (!) 160/53, pulse (!) 58, temperature 97.7 F (36.5 C), temperature source Oral, resp. rate 18, height 5' 8"  (1.727 m), weight 142 lb 3.2 oz (64.5 kg), SpO2 100 %.  LABORATORY DATA: Lab Results  Component Value Date   WBC 4.7 07/24/2017   HGB 10.9 (L) 07/24/2017   HCT 32.8 (L) 07/24/2017   MCV 90.2 07/24/2017   PLT 259 07/24/2017      Chemistry      Component Value Date/Time   NA 140 07/24/2017 1026   K 4.6 07/24/2017 1026   CL 105 09/30/2016 1100   CL 107 04/05/2013 0754   CO2 25 07/24/2017 1026   BUN 18.3 07/24/2017 1026   CREATININE 1.3 (H) 07/24/2017 1026      Component Value Date/Time   CALCIUM 10.0 07/24/2017 1026   ALKPHOS 76 07/24/2017 1026   AST 21  07/24/2017 1026   ALT 15 07/24/2017 1026   BILITOT 0.38 07/24/2017 1026       RADIOGRAPHIC STUDIES: Ct Chest W Contrast  Result Date: 07/24/2017 CLINICAL DATA:  Followup lung cancer. EXAM: CT CHEST, ABDOMEN, AND PELVIS WITH CONTRAST TECHNIQUE: Multidetector CT imaging of the chest, abdomen and pelvis was performed following the standard protocol during bolus administration of intravenous contrast. CONTRAST:  62m ISOVUE-300 IOPAMIDOL (ISOVUE-300) INJECTION 61% COMPARISON:  04/28/2017 FINDINGS: CT CHEST FINDINGS Cardiovascular: Normal heart size. Aortic atherosclerosis. Calcification within the LAD coronary artery is identified. No pericardial effusion. Mediastinum/Nodes: Moderate to large hiatal hernia noted. No mediastinal or hilar adenopathy. Multiple small subcentimeter nodules identified within the thyroid gland. There is no mediastinal  or hilar adenopathy. No axillary or supraclavicular adenopathy. Lungs/Pleura: No pleural effusion identified. Postoperative changes from wedge resection surgery in the left upper lobe. Stable from previous study. No suspicious pulmonary nodules. Musculoskeletal: The bones appear osteopenic. No aggressive lytic or sclerotic bone lesions. Status post bilateral mastectomy. CT ABDOMEN PELVIS FINDINGS Hepatobiliary: No suspicious liver abnormality. 2.9 cm right lobe of liver cyst noted. Gallbladder appears normal. No biliary dilatation. Pancreas: Unremarkable. No pancreatic ductal dilatation or surrounding inflammatory changes. Spleen: Normal in size without focal abnormality. Adrenals/Urinary Tract: The adrenal glands appear normal. Small right kidney cysts. No kidney mass or hydronephrosis noted bilaterally. The urinary bladder appears normal. Stomach/Bowel: Hiatal hernia noted. The small bowel loops have a normal course and caliber without evidence for obstruction. Status post right hemicolectomy. No pathologic dilatation of the colon. A few scattered distal colonic diverticula noted. Vascular/Lymphatic: Aortic atherosclerosis. No aneurysm. No upper abdominal adenopathy. There is no pelvic or inguinal adenopathy. Reproductive: Calcified uterine fibroids noted. Other: There is no ascites or focal fluid collections within the abdomen or pelvis. Musculoskeletal: Previous left hip arthroplasty. Degenerative disc disease noted within the lower thoracic and lumbar spine. No aggressive lytic or sclerotic bone lesions. IMPRESSION: 1. Stable CT of the chest, abdomen and pelvis. 2. Status post left upper lobe wedge resection, right hemicolectomy and bilateral mastectomy. No evidence for recurrent tumor or metastatic disease. 3.  Aortic Atherosclerosis (ICD10-I70.0). 4. Lad coronary artery calcifications 5. Hiatal hernia. Electronically Signed   By: TKerby MoorsM.D.   On: 07/24/2017 16:31   Ct Abdomen Pelvis W  Contrast  Result Date: 07/24/2017 CLINICAL DATA:  Followup lung cancer. EXAM: CT CHEST, ABDOMEN, AND PELVIS WITH CONTRAST TECHNIQUE: Multidetector CT imaging of the chest, abdomen and pelvis was performed following the standard protocol during bolus administration of intravenous contrast. CONTRAST:  879mISOVUE-300 IOPAMIDOL (ISOVUE-300) INJECTION 61% COMPARISON:  04/28/2017 FINDINGS: CT CHEST FINDINGS Cardiovascular: Normal heart size. Aortic atherosclerosis. Calcification within the LAD coronary artery is identified. No pericardial effusion. Mediastinum/Nodes: Moderate to large hiatal hernia noted. No mediastinal or hilar adenopathy. Multiple small subcentimeter nodules identified within the thyroid gland. There is no mediastinal or hilar adenopathy. No axillary or supraclavicular adenopathy. Lungs/Pleura: No pleural effusion identified. Postoperative changes from wedge resection surgery in the left upper lobe. Stable from previous study. No suspicious pulmonary nodules. Musculoskeletal: The bones appear osteopenic. No aggressive lytic or sclerotic bone lesions. Status post bilateral mastectomy. CT ABDOMEN PELVIS FINDINGS Hepatobiliary: No suspicious liver abnormality. 2.9 cm right lobe of liver cyst noted. Gallbladder appears normal. No biliary dilatation. Pancreas: Unremarkable. No pancreatic ductal dilatation or surrounding inflammatory changes. Spleen: Normal in size without focal abnormality. Adrenals/Urinary Tract: The adrenal glands appear normal. Small right kidney cysts. No kidney mass or hydronephrosis noted bilaterally. The urinary bladder  appears normal. Stomach/Bowel: Hiatal hernia noted. The small bowel loops have a normal course and caliber without evidence for obstruction. Status post right hemicolectomy. No pathologic dilatation of the colon. A few scattered distal colonic diverticula noted. Vascular/Lymphatic: Aortic atherosclerosis. No aneurysm. No upper abdominal adenopathy. There is no  pelvic or inguinal adenopathy. Reproductive: Calcified uterine fibroids noted. Other: There is no ascites or focal fluid collections within the abdomen or pelvis. Musculoskeletal: Previous left hip arthroplasty. Degenerative disc disease noted within the lower thoracic and lumbar spine. No aggressive lytic or sclerotic bone lesions. IMPRESSION: 1. Stable CT of the chest, abdomen and pelvis. 2. Status post left upper lobe wedge resection, right hemicolectomy and bilateral mastectomy. No evidence for recurrent tumor or metastatic disease. 3.  Aortic Atherosclerosis (ICD10-I70.0). 4. Lad coronary artery calcifications 5. Hiatal hernia. Electronically Signed   By: Kerby Moors M.D.   On: 07/24/2017 16:31    ASSESSMENT AND PLAN:  This is a very pleasant 76 years old white female with metastatic non-small cell lung cancer, adenocarcinoma diagnosed in March 2012 with positive EGFR mutation status post 68 months of treatment with Tarceva discontinued secondary to disease progression and development of EGFR T790M resistant mutation. The patient is currently on treatment with Tagrisso 80 mg by mouth daily status post 9 months.  She tolerated the last month of her treatment fairly well with no significant diarrhea or skin rash. The patient had repeat CT scan of the chest, abdomen and pelvis performed recently. I personally and independently reviewed the scans and discuss the results with the patient and her daughter. Her scan showed no evidence for disease recurrence or metastasis. I recommended for the patient to continue her current treatment with Tagrisso with the same dose. She will also continue her current treatment with Femara for the breast cancer. For hypertension, I strongly recommend for the patient to continue with her blood pressure medication and to monitor it closely with her primary care physician. The patient was advised to call immediately if she has any concerning symptoms in the interval. The  patient voices understanding of current disease status and treatment options and is in agreement with the current care plan. All questions were answered. The patient knows to call the clinic with any problems, questions or concerns. We can certainly see the patient much sooner if necessary.  Disclaimer: This note was dictated with voice recognition software. Similar sounding words can inadvertently be transcribed and may not be corrected upon review.

## 2017-07-31 ENCOUNTER — Other Ambulatory Visit: Payer: Self-pay | Admitting: Medical Oncology

## 2017-07-31 DIAGNOSIS — C50112 Malignant neoplasm of central portion of left female breast: Secondary | ICD-10-CM

## 2017-07-31 DIAGNOSIS — D638 Anemia in other chronic diseases classified elsewhere: Secondary | ICD-10-CM

## 2017-07-31 DIAGNOSIS — C50111 Malignant neoplasm of central portion of right female breast: Secondary | ICD-10-CM

## 2017-07-31 DIAGNOSIS — C3412 Malignant neoplasm of upper lobe, left bronchus or lung: Secondary | ICD-10-CM

## 2017-07-31 DIAGNOSIS — Z170 Estrogen receptor positive status [ER+]: Secondary | ICD-10-CM

## 2017-07-31 DIAGNOSIS — C182 Malignant neoplasm of ascending colon: Secondary | ICD-10-CM

## 2017-07-31 DIAGNOSIS — Z5111 Encounter for antineoplastic chemotherapy: Secondary | ICD-10-CM

## 2017-07-31 MED ORDER — OSIMERTINIB MESYLATE 80 MG PO TABS: 80.0000 mg | ORAL_TABLET | Freq: Every day | ORAL | 5 refills | Status: DC

## 2017-08-28 ENCOUNTER — Other Ambulatory Visit (HOSPITAL_BASED_OUTPATIENT_CLINIC_OR_DEPARTMENT_OTHER): Payer: Medicare Other

## 2017-08-28 ENCOUNTER — Encounter: Payer: Self-pay | Admitting: Internal Medicine

## 2017-08-28 ENCOUNTER — Encounter: Payer: Self-pay | Admitting: *Deleted

## 2017-08-28 ENCOUNTER — Other Ambulatory Visit: Payer: Self-pay | Admitting: *Deleted

## 2017-08-28 ENCOUNTER — Telehealth: Payer: Self-pay | Admitting: Internal Medicine

## 2017-08-28 ENCOUNTER — Ambulatory Visit (HOSPITAL_BASED_OUTPATIENT_CLINIC_OR_DEPARTMENT_OTHER): Payer: Medicare Other | Admitting: Internal Medicine

## 2017-08-28 VITALS — BP 156/48 | HR 69 | Temp 97.7°F | Resp 18 | Ht 68.0 in | Wt 142.8 lb

## 2017-08-28 DIAGNOSIS — D638 Anemia in other chronic diseases classified elsewhere: Secondary | ICD-10-CM

## 2017-08-28 DIAGNOSIS — Z5111 Encounter for antineoplastic chemotherapy: Secondary | ICD-10-CM

## 2017-08-28 DIAGNOSIS — C3412 Malignant neoplasm of upper lobe, left bronchus or lung: Secondary | ICD-10-CM

## 2017-08-28 DIAGNOSIS — C50111 Malignant neoplasm of central portion of right female breast: Secondary | ICD-10-CM

## 2017-08-28 DIAGNOSIS — C50112 Malignant neoplasm of central portion of left female breast: Secondary | ICD-10-CM

## 2017-08-28 DIAGNOSIS — I10 Essential (primary) hypertension: Secondary | ICD-10-CM | POA: Diagnosis not present

## 2017-08-28 DIAGNOSIS — C3490 Malignant neoplasm of unspecified part of unspecified bronchus or lung: Secondary | ICD-10-CM

## 2017-08-28 DIAGNOSIS — Z170 Estrogen receptor positive status [ER+]: Secondary | ICD-10-CM

## 2017-08-28 LAB — COMPREHENSIVE METABOLIC PANEL
ALT: 13 U/L (ref 0–55)
AST: 18 U/L (ref 5–34)
Albumin: 3.5 g/dL (ref 3.5–5.0)
Alkaline Phosphatase: 66 U/L (ref 40–150)
Anion Gap: 8 mEq/L (ref 3–11)
BILIRUBIN TOTAL: 0.33 mg/dL (ref 0.20–1.20)
BUN: 22 mg/dL (ref 7.0–26.0)
CHLORIDE: 109 meq/L (ref 98–109)
CO2: 22 meq/L (ref 22–29)
Calcium: 9.4 mg/dL (ref 8.4–10.4)
Creatinine: 1.3 mg/dL — ABNORMAL HIGH (ref 0.6–1.1)
EGFR: 41 mL/min/{1.73_m2} — AB (ref >60)
GLUCOSE: 97 mg/dL (ref 70–140)
POTASSIUM: 4.6 meq/L (ref 3.5–5.1)
SODIUM: 139 meq/L (ref 136–145)
TOTAL PROTEIN: 6.5 g/dL (ref 6.4–8.3)

## 2017-08-28 LAB — CBC WITH DIFFERENTIAL/PLATELET
BASO%: 1 % (ref 0.0–2.0)
BASOS ABS: 0 10*3/uL (ref 0.0–0.1)
EOS ABS: 0.1 10*3/uL (ref 0.0–0.5)
EOS%: 3.3 % (ref 0.0–7.0)
HEMATOCRIT: 29.4 % — AB (ref 34.8–46.6)
HEMOGLOBIN: 9.7 g/dL — AB (ref 11.6–15.9)
LYMPH#: 0.7 10*3/uL — AB (ref 0.9–3.3)
LYMPH%: 20.5 % (ref 14.0–49.7)
MCH: 29.6 pg (ref 25.1–34.0)
MCHC: 33 g/dL (ref 31.5–36.0)
MCV: 89.6 fL (ref 79.5–101.0)
MONO#: 0.7 10*3/uL (ref 0.1–0.9)
MONO%: 19.4 % — ABNORMAL HIGH (ref 0.0–14.0)
NEUT#: 2 10*3/uL (ref 1.5–6.5)
NEUT%: 55.8 % (ref 38.4–76.8)
PLATELETS: 233 10*3/uL (ref 145–400)
RBC: 3.28 10*6/uL — ABNORMAL LOW (ref 3.70–5.45)
RDW: 14.6 % — AB (ref 11.2–14.5)
WBC: 3.6 10*3/uL — ABNORMAL LOW (ref 3.9–10.3)

## 2017-08-28 NOTE — Progress Notes (Signed)
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 2) adenocarcinoma of the ascending colon (T2, N0, M0) with MSI high diagnosed in March 2017. 3) history of breast adenocarcinoma.  PRIOR THERAPY:  1) Tarceva 150 mg by mouth daily status post 68 months of treatment. 2) Status post right colectomy with lymph node dissection in May 2017.  CURRENT THERAPY: 1) Tagrisso 80 mg by mouth daily started 11/09/2016. 2) observation for the history of colon adenocarcinoma. 3) Femara 2.5 mg by mouth daily for history of breast cancer.  INTERVAL HISTORY: Kristin [name] 76 y.o. female returns to the clinic today for follow-up visit.  The patient is feeling fine today with no specific complaints except for mild fatigue.  She continues to tolerate her treatment with Tagrisso fairly well.  She denied having any chest pain, shortness of breath, cough or hemoptysis.  She denied having any fever or chills.  She has no nausea, vomiting, diarrhea or constipation.  She is here today for evaluation and repeat blood work.  MEDICAL HISTORY: Past Medical History:  Diagnosis Date  . Allergy   . Anemia   . Anemia of chronic disease 09/21/2016  . Anxiety   . Arthritis    arthritis- left hip  . Blood transfusion without reported diagnosis    transfusion- 3-4 yrs ago -found to be anemic on routine lab check  . Breast cancer ([place]) 2002   bilateral- bilateral mastectomies done.  . 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 CVA (cerebral infarction) 04/30/2013  . Encounter for antineoplastic chemotherapy 10/28/2016  . Family history of brain cancer   . Family history of breast cancer   . Family history of  colon cancer 10/06/2009  . Family history of colon cancer   . Family history of prostate cancer   . GERD (gastroesophageal reflux disease)   . Glaucoma   . History of bilateral mastectomy 05/20/2010  . Hypertension   . Lung cancer ([place]) dx'd 12/2010   last Ct scan "no lung cancer" showing 11'16 CT Chest Epic.  . Malignant neoplasm of ascending colon ([place]) 01/29/2016  . Stroke [facility])    2 yrs ago-no residual  . Tubular adenoma of colon 07/2011   colon polyps ans reoccurence with malignancy found    ALLERGIES:  is allergic to doxycycline and sulfa antibiotics.  MEDICATIONS:  Current Outpatient Medications  Medication Sig Dispense Refill  . amLODipine (NORVASC) 5 MG tablet Take 5 mg by mouth daily. Per Pt -she takes 2.5 mg in am and 5 mg in pm    . brimonidine-timolol (COMBIGAN) 0.2-0.5 % ophthalmic solution Place 1 drop into the right eye every 12 (twelve) hours.    . clopidogrel (PLAVIX) 75 MG tablet Take 75 mg by mouth daily.    . cycloSPORINE (RESTASIS) 0.05 % ophthalmic emulsion Place 1 drop into both eyes 2 (two) times daily.    Marland Kitchen escitalopram (LEXAPRO) 10 MG tablet Take 10 mg by mouth daily.     . famotidine (PEPCID) 40 MG tablet Take 40 mg by mouth daily.    Marland Kitchen FeFum-FePoly-FA-B Cmp-C-Biot (FOLIVANE-PLUS) CAPS TAKE ONE CAPSULE BY MOUTH EACH DAY 90 capsule 2  . letrozole (FEMARA) 2.5 MG tablet TAKE ONE TABLET BY MOUTH EACH  DAY 30 tablet 3  . lisinopril (PRINIVIL,ZESTRIL) 20 MG tablet Take 1 tablet by mouth 2 (two) times daily.    Marland Kitchen osimertinib mesylate (TAGRISSO) 80 MG tablet Take 1 tablet (80 mg total) by mouth daily. 30 tablet 5  . zolpidem (AMBIEN) 5 MG tablet Take 5 mg by mouth at bedtime as needed for sleep. Reported on 04/25/2016     Current Facility-Administered Medications  Medication Dose Route Frequency Provider Last Rate Last Dose  . 0.9 %  sodium chloride infusion  500 mL Intravenous Continuous Armbruster, Carlota Raspberry, MD        SURGICAL HISTORY:  Past Surgical History:    Procedure Laterality Date  . APPENDECTOMY    . cataracts Bilateral   . COLON SURGERY  02/2016  . COLONOSCOPY    . EYE SURGERY    . LUNG SURGERY     Resection -" not done. Pt . tx with Tarceva  . MASTECTOMY Bilateral   . RECONSTRUCTION BREAST W/ TRAM FLAP Bilateral    bilaterally  . TONSILLECTOMY      REVIEW OF SYSTEMS:  A comprehensive review of systems was negative except for: Constitutional: positive for fatigue   PHYSICAL EXAMINATION: General appearance: alert, cooperative, fatigued and no distress Head: Normocephalic, without obvious abnormality, atraumatic Neck: no adenopathy, no JVD, supple, symmetrical, trachea midline and thyroid not enlarged, symmetric, no tenderness/mass/nodules Lymph nodes: Cervical, supraclavicular, and axillary nodes normal. Resp: clear to auscultation bilaterally Back: symmetric, no curvature. ROM normal. No CVA tenderness. Cardio: regular rate and rhythm, S1, S2 normal, no murmur, click, rub or gallop GI: soft, non-tender; bowel sounds normal; no masses,  no organomegaly Extremities: extremities normal, atraumatic, no cyanosis or edema  ECOG PERFORMANCE STATUS: 1 - Symptomatic but completely ambulatory  Blood pressure (!) 156/48, pulse 69, temperature 97.7 F (36.5 C), temperature source Oral, resp. rate 18, height 5' 8" (1.727 m), weight 142 lb 12.8 oz (64.8 kg), SpO2 100 %.  LABORATORY DATA: Lab Results  Component Value Date   WBC 3.6 (L) 08/28/2017   HGB 9.7 (L) 08/28/2017   HCT 29.4 (L) 08/28/2017   MCV 89.6 08/28/2017   PLT 233 08/28/2017      Chemistry      Component Value Date/Time   NA 139 08/28/2017 0918   K 4.6 08/28/2017 0918   CL 105 09/30/2016 1100   CL 107 04/05/2013 0754   CO2 22 08/28/2017 0918   BUN 22.0 08/28/2017 0918   CREATININE 1.3 (H) 08/28/2017 0918      Component Value Date/Time   CALCIUM 9.4 08/28/2017 0918   ALKPHOS 66 08/28/2017 0918   AST 18 08/28/2017 0918   ALT 13 08/28/2017 0918   BILITOT 0.33  08/28/2017 0918       RADIOGRAPHIC STUDIES: No results found.  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 The patient is currently on treatment with Tagrisso 80 mg by mouth daily status post 10 months.  The patient continues to tolerate her treatment with Tagrisso fairly well. I recommended for her to continue with Tagrisso with the same dose. I will see her back for follow-up visit in 1 month for evaluation after repeating CT scan of the chest, abdomen and pelvis for restaging of her disease. For the anemia  of chronic disease, she was advised to continue with the oral iron tablets at regular basis. She tolerated the last month of her treatment fairly well with no significant diarrhea or skin rash. The patient had repeat CT scan of the chest, abdomen and pelvis performed recently. I personally and independently reviewed the scans and discuss the results with the patient and her daughter. Her scan showed no evidence for disease recurrence or metastasis. I recommended for the patient to continue her current treatment with Tagrisso with the same dose. She will also continue her current treatment with Femara for the breast cancer. For hypertension, I strongly recommend for the patient to continue with her blood pressure medication and to monitor it closely with her primary care physician. She was advised to call immediately if she has any concerning symptoms in the interval. The patient voices understanding of current disease status and treatment options and is in agreement with the current care plan. All questions were answered. The patient knows to call the clinic with any problems, questions or concerns. We can  certainly see the patient much sooner if necessary.  Disclaimer: This note was dictated with voice recognition software. Similar sounding words can inadvertently be transcribed and may not be corrected upon review.

## 2017-08-28 NOTE — Telephone Encounter (Signed)
Scheduled appt per 11/8 los - Gave patient AVS and calender per los. - central radiology to contact patient with ct schedule.

## 2017-09-01 ENCOUNTER — Other Ambulatory Visit: Payer: Self-pay | Admitting: Internal Medicine

## 2017-09-26 ENCOUNTER — Ambulatory Visit (HOSPITAL_COMMUNITY)
Admission: RE | Admit: 2017-09-26 | Discharge: 2017-09-26 | Disposition: A | Discharge status: Home / Self Care | Payer: Medicare Other | Source: Ambulatory Visit | Attending: Internal Medicine | Admitting: Internal Medicine

## 2017-09-26 ENCOUNTER — Other Ambulatory Visit (HOSPITAL_BASED_OUTPATIENT_CLINIC_OR_DEPARTMENT_OTHER): Payer: Medicare Other

## 2017-09-26 DIAGNOSIS — C3412 Malignant neoplasm of upper lobe, left bronchus or lung: Secondary | ICD-10-CM | POA: Diagnosis not present

## 2017-09-26 DIAGNOSIS — C3490 Malignant neoplasm of unspecified part of unspecified bronchus or lung: Secondary | ICD-10-CM | POA: Insufficient documentation

## 2017-09-26 DIAGNOSIS — I700 Atherosclerosis of aorta: Secondary | ICD-10-CM | POA: Insufficient documentation

## 2017-09-26 LAB — COMPREHENSIVE METABOLIC PANEL WITH GFR
ALT: 13 U/L (ref 0–55)
AST: 18 U/L (ref 5–34)
Albumin: 3.6 g/dL (ref 3.5–5.0)
Alkaline Phosphatase: 68 U/L (ref 40–150)
Anion Gap: 7 meq/L (ref 3–11)
BUN: 16 mg/dL (ref 7.0–26.0)
CO2: 26 meq/L (ref 22–29)
Calcium: 9.9 mg/dL (ref 8.4–10.4)
Chloride: 106 meq/L (ref 98–109)
Creatinine: 1.2 mg/dL — ABNORMAL HIGH (ref 0.6–1.1)
EGFR: 42 ml/min/1.73 m2 — ABNORMAL LOW
Glucose: 93 mg/dL (ref 70–140)
Potassium: 5.2 meq/L — ABNORMAL HIGH (ref 3.5–5.1)
Sodium: 138 meq/L (ref 136–145)
Total Bilirubin: 0.31 mg/dL (ref 0.20–1.20)
Total Protein: 6.6 g/dL (ref 6.4–8.3)

## 2017-09-26 LAB — CBC WITH DIFFERENTIAL/PLATELET
BASO%: 0.9 % (ref 0.0–2.0)
Basophils Absolute: 0 10*3/uL (ref 0.0–0.1)
EOS%: 2.3 % (ref 0.0–7.0)
Eosinophils Absolute: 0.1 10*3/uL (ref 0.0–0.5)
HEMATOCRIT: 32.5 % — AB (ref 34.8–46.6)
HEMOGLOBIN: 10.5 g/dL — AB (ref 11.6–15.9)
LYMPH%: 19.9 % (ref 14.0–49.7)
MCH: 29.2 pg (ref 25.1–34.0)
MCHC: 32.4 g/dL (ref 31.5–36.0)
MCV: 90.1 fL (ref 79.5–101.0)
MONO#: 0.8 10*3/uL (ref 0.1–0.9)
MONO%: 18.5 % — ABNORMAL HIGH (ref 0.0–14.0)
NEUT%: 58.4 % (ref 38.4–76.8)
NEUTROS ABS: 2.5 10*3/uL (ref 1.5–6.5)
Platelets: 240 10*3/uL (ref 145–400)
RBC: 3.6 10*6/uL — AB (ref 3.70–5.45)
RDW: 14.7 % — ABNORMAL HIGH (ref 11.2–14.5)
WBC: 4.2 10*3/uL (ref 3.9–10.3)
lymph#: 0.8 10*3/uL — ABNORMAL LOW (ref 0.9–3.3)

## 2017-09-26 LAB — RESEARCH LABS

## 2017-09-26 MED ORDER — IOPAMIDOL (ISOVUE-300) INJECTION 61%
INTRAVENOUS | Status: AC
  Filled 2017-09-26: qty 100

## 2017-09-26 MED ORDER — IOPAMIDOL (ISOVUE-300) INJECTION 61%
100.0000 mL | Freq: Once | INTRAVENOUS | Status: AC | PRN
  Administered 2017-09-26: 80 mL via INTRAVENOUS

## 2017-09-26 NOTE — Progress Notes (Signed)
Patient had research labs for [phone] study drawn today and was given her gift card and copies of both signed consents by the research specialist, Remer Macho. Mauri Reading Michaelle Copas Therapist, sports, BSN Clinical Research Nurse 09/26/17 @ [phone]

## 2017-09-30 ENCOUNTER — Ambulatory Visit: Payer: Self-pay | Admitting: Internal Medicine

## 2017-10-06 ENCOUNTER — Telehealth: Payer: Self-pay | Admitting: Internal Medicine

## 2017-10-06 ENCOUNTER — Ambulatory Visit (HOSPITAL_BASED_OUTPATIENT_CLINIC_OR_DEPARTMENT_OTHER): Payer: Medicare Other | Admitting: Internal Medicine

## 2017-10-06 ENCOUNTER — Encounter: Payer: Self-pay | Admitting: Internal Medicine

## 2017-10-06 VITALS — BP 131/42 | HR 67 | Temp 97.9°F | Resp 20 | Wt 140.3 lb

## 2017-10-06 DIAGNOSIS — C182 Malignant neoplasm of ascending colon: Secondary | ICD-10-CM | POA: Diagnosis not present

## 2017-10-06 DIAGNOSIS — I10 Essential (primary) hypertension: Secondary | ICD-10-CM | POA: Diagnosis not present

## 2017-10-06 DIAGNOSIS — C3412 Malignant neoplasm of upper lobe, left bronchus or lung: Secondary | ICD-10-CM

## 2017-10-06 DIAGNOSIS — D638 Anemia in other chronic diseases classified elsewhere: Secondary | ICD-10-CM | POA: Diagnosis not present

## 2017-10-06 NOTE — Progress Notes (Signed)
[place] [phone]   Fax:([phone]  OFFICE PROGRESS NOTE  Eber Hong, MD [address] [place] [address]  DIAGNOSIS:  1) metastatic non-small cell lung cancer, adenocarcinoma diagnosed in March 2012 with positive EGFR mutation in exon 21 (L858R). 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 2) adenocarcinoma of the ascending colon (T2, N0, M0) with MSI high diagnosed in March 2017. 3) history of breast adenocarcinoma.  PRIOR THERAPY:  1) Tarceva 150 mg by mouth daily status post 68 months of treatment. 2) Status post right colectomy with lymph node dissection in May 2017.  CURRENT THERAPY: 1) Tagrisso 80 mg by mouth daily started 11/09/2016. 2) observation for the history of colon adenocarcinoma. 3) Femara 2.5 mg by mouth daily for history of breast cancer.  INTERVAL HISTORY: Kristin [name] 76 y.o. female returns to the clinic today for follow-up visit accompanied by her daughter [name].  The patient is feeling fine today with no specific complaints.  She denied having any chest pain, shortness breath, cough or hemoptysis.  She denied having any weight loss or night sweats.  She has no skin rash or diarrhea.  She has no nausea, vomiting or constipation.  She has been tolerating her treatment with Tagrisso fairly well.  She has no headache or visual changes.  The patient had repeat CT scan of the chest, abdomen and pelvis performed recently and she is here for evaluation of her scan results.   MEDICAL HISTORY: Past Medical History:  Diagnosis Date  . Allergy   . Anemia   . Anemia of chronic disease 09/21/2016  . Anxiety   . Arthritis    arthritis- left hip  . Blood transfusion without reported diagnosis    transfusion- 3-4 yrs ago -found to be anemic on routine lab check  . Breast cancer ([place]) 2002   bilateral- bilateral mastectomies done.  . 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 CVA (cerebral  infarction) 04/30/2013  . Encounter for antineoplastic chemotherapy 10/28/2016  . Family history of brain cancer   . Family history of breast cancer   . Family history of colon cancer 10/06/2009  . Family history of colon cancer   . Family history of prostate cancer   . GERD (gastroesophageal reflux disease)   . Glaucoma   . History of bilateral mastectomy 05/20/2010  . Hypertension   . Lung cancer ([place]) dx'd 12/2010   last Ct scan "no lung cancer" showing 11'16 CT Chest Epic.  . Malignant neoplasm of ascending colon ([place]) 01/29/2016  . Stroke [facility])    2 yrs ago-no residual  . Tubular adenoma of colon 07/2011   colon polyps ans reoccurence with malignancy found    ALLERGIES:  is allergic to doxycycline and sulfa antibiotics.  MEDICATIONS:  Current Outpatient Medications  Medication Sig Dispense Refill  . alendronate (FOSAMAX) 70 MG tablet Take 70 mg once a week by mouth.    Marland Kitchen amLODipine (NORVASC) 5 MG tablet Take 5 mg by mouth daily. Per Pt -she takes 2.5 mg in am and 5 mg in pm    . brimonidine-timolol (COMBIGAN) 0.2-0.5 % ophthalmic solution Place 1 drop into the right eye every 12 (twelve) hours.    . Cholecalciferol (VITAMIN D3) 2000 units capsule Take 2,000 Units daily by mouth.    . clopidogrel (PLAVIX) 75 MG tablet Take 75 mg by mouth daily.    . cycloSPORINE (RESTASIS) 0.05 % ophthalmic emulsion Place 1 drop into both eyes 2 (  two) times daily.    Marland Kitchen escitalopram (LEXAPRO) 10 MG tablet Take 10 mg by mouth daily.     . famotidine (PEPCID) 40 MG tablet Take 40 mg by mouth daily.    Marland Kitchen FeFum-FePoly-FA-B Cmp-C-Biot (FOLIVANE-PLUS) CAPS TAKE ONE CAPSULE BY MOUTH EACH DAY 90 capsule 2  . letrozole (FEMARA) 2.5 MG tablet TAKE ONE TABLET EACH DAY 30 tablet 3  . lisinopril (PRINIVIL,ZESTRIL) 20 MG tablet Take 1 tablet by mouth 2 (two) times daily.    Marland Kitchen osimertinib mesylate (TAGRISSO) 80 MG tablet Take 1 tablet (80 mg total) by mouth daily. 30 tablet 5   Current Facility-Administered  Medications  Medication Dose Route Frequency Provider Last Rate Last Dose  . 0.9 %  sodium chloride infusion  500 mL Intravenous Continuous Armbruster, Carlota Raspberry, MD        SURGICAL HISTORY:  Past Surgical History:  Procedure Laterality Date  . APPENDECTOMY    . cataracts Bilateral   . COLON SURGERY  02/2016  . COLONOSCOPY    . EYE SURGERY    . LUNG SURGERY     Resection -" not done. Pt . tx with Tarceva  . MASTECTOMY Bilateral   . MEDIASTINOSCOPY N/A 10/02/2016   Procedure: MEDIASTINOSCOPY;  Surgeon: [name], MD;  Location: [facility] OR;  Service: Thoracic;  Laterality: N/A;  . RECONSTRUCTION BREAST W/ TRAM FLAP Bilateral    bilaterally  . TEE WITHOUT CARDIOVERSION N/A 06/04/2013   Procedure: TRANSESOPHAGEAL ECHOCARDIOGRAM (TEE);  Surgeon: Jolaine Artist, MD;  Location: [facility] ENDOSCOPY;  Service: Cardiovascular;  Laterality: N/A;  . TONSILLECTOMY    . TOTAL HIP ARTHROPLASTY Left 08/07/2016   Procedure: LEFT TOTAL HIP ARTHROPLASTY ANTERIOR APPROACH;  Surgeon: Gaynelle Arabian, MD;  Location: [facility] ORS;  Service: Orthopedics;  Laterality: Left;    REVIEW OF SYSTEMS:  Constitutional: positive for fatigue Eyes: negative Ears, nose, mouth, throat, and face: negative Respiratory: negative Cardiovascular: negative Gastrointestinal: negative Genitourinary:negative Integument/breast: negative Hematologic/lymphatic: negative Musculoskeletal:negative Neurological: negative Behavioral/Psych: negative Endocrine: negative Allergic/Immunologic: negative   PHYSICAL EXAMINATION: General appearance: alert, cooperative, fatigued and no distress Head: Normocephalic, without obvious abnormality, atraumatic Neck: no adenopathy, no JVD, supple, symmetrical, trachea midline and thyroid not enlarged, symmetric, no tenderness/mass/nodules Lymph nodes: Cervical, supraclavicular, and axillary nodes normal. Resp: clear to auscultation bilaterally Back: symmetric, no curvature. ROM normal. No CVA  tenderness. Cardio: regular rate and rhythm, S1, S2 normal, no murmur, click, rub or gallop GI: soft, non-tender; bowel sounds normal; no masses,  no organomegaly Extremities: extremities normal, atraumatic, no cyanosis or edema Neurologic: Alert and oriented X 3, normal strength and tone. Normal symmetric reflexes. Normal coordination and gait  ECOG PERFORMANCE STATUS: 1 - Symptomatic but completely ambulatory  Blood pressure (!) 131/42, pulse 67, temperature 97.9 F (36.6 C), temperature source Oral, resp. rate 20, weight 140 lb 4.8 oz (63.6 kg), SpO2 100 %.  LABORATORY DATA: Lab Results  Component Value Date   WBC 4.2 09/26/2017   HGB 10.5 (L) 09/26/2017   HCT 32.5 (L) 09/26/2017   MCV 90.1 09/26/2017   PLT 240 09/26/2017      Chemistry      Component Value Date/Time   NA 138 09/26/2017 0942   K 5.2 No visable hemolysis (H) 09/26/2017 0942   CL 105 09/30/2016 1100   CL 107 04/05/2013 0754   CO2 26 09/26/2017 0942   BUN 16.0 09/26/2017 0942   CREATININE 1.2 (H) 09/26/2017 0942      Component Value Date/Time   CALCIUM 9.9  09/26/2017 0942   ALKPHOS 68 09/26/2017 0942   AST 18 09/26/2017 0942   ALT 13 09/26/2017 0942   BILITOT 0.31 09/26/2017 0942       RADIOGRAPHIC STUDIES: Ct Chest W Contrast  Result Date: 09/26/2017 CLINICAL DATA:  Breast cancer diagnosed 2012. Lung cancer diagnosed 2013. Colon cancer diagnosed 2017. Bilateral mastectomy, partial colectomy. Appendectomy. EXAM: CT CHEST, ABDOMEN, AND PELVIS WITH CONTRAST TECHNIQUE: Multidetector CT imaging of the chest, abdomen and pelvis was performed following the standard protocol during bolus administration of intravenous contrast. CONTRAST:  36m ISOVUE-300 IOPAMIDOL (ISOVUE-300) INJECTION 61% COMPARISON:  07/24/2017 FINDINGS: CT CHEST FINDINGS Cardiovascular: No significant vascular findings. Normal heart size. No pericardial effusion. Mediastinum/Nodes: No axillary supraclavicular adenopathy. No mediastinal hilar  adenopathy. Normal esophagus. Large sliding-type hiatal hernia. Lungs/Pleura: Surgical scar in the LEFT upper lobe without nodularity. No suspicious nodules. Normal airways. Musculoskeletal: No aggressive osseous lesion. CT ABDOMEN AND PELVIS FINDINGS Hepatobiliary: Simple cyst the RIGHT hepatic lobe. No enhancing lesion. Pancreas: Pancreas is normal. No ductal dilatation. No pancreatic inflammation. Spleen: Normal spleen Adrenals/urinary tract: Adrenal glands and kidneys are normal. The ureters and bladder normal. Stomach/Bowel: sliding-type hiatal hernia. Duodenum and small-bowel normal. Partial RIGHT hemicolectomy anatomy. No nodularity at the anastomosis. Diverticula of the sigmoid colon without acute inflammation. Rectum normal Vascular/Lymphatic: Abdominal aorta is normal caliber with atherosclerotic calcification. There is no retroperitoneal or periportal lymphadenopathy. No pelvic lymphadenopathy. Reproductive: Uterus and ovaries normal Other: No peritoneal metastasis Musculoskeletal: No aggressive osseous lesion IMPRESSION: Chest Impression: 1. Stable postsurgical change in the LEFT lung. 2. Large sliding-type hiatal hernia Abdomen / Pelvis Impression: 1. No evidence metastatic disease in the abdomen pelvis. 2. RIGHT hemicolectomy anatomy. 3. No lymphadenopathy. 4.  Aortic Atherosclerosis (ICD10-I70.0). Electronically Signed   By: SSuzy BouchardM.D.   On: 09/26/2017 13:47   Ct Abdomen Pelvis W Contrast  Result Date: 09/26/2017 CLINICAL DATA:  Breast cancer diagnosed 2012. Lung cancer diagnosed 2013. Colon cancer diagnosed 2017. Bilateral mastectomy, partial colectomy. Appendectomy. EXAM: CT CHEST, ABDOMEN, AND PELVIS WITH CONTRAST TECHNIQUE: Multidetector CT imaging of the chest, abdomen and pelvis was performed following the standard protocol during bolus administration of intravenous contrast. CONTRAST:  834mISOVUE-300 IOPAMIDOL (ISOVUE-300) INJECTION 61% COMPARISON:  07/24/2017 FINDINGS: CT CHEST  FINDINGS Cardiovascular: No significant vascular findings. Normal heart size. No pericardial effusion. Mediastinum/Nodes: No axillary supraclavicular adenopathy. No mediastinal hilar adenopathy. Normal esophagus. Large sliding-type hiatal hernia. Lungs/Pleura: Surgical scar in the LEFT upper lobe without nodularity. No suspicious nodules. Normal airways. Musculoskeletal: No aggressive osseous lesion. CT ABDOMEN AND PELVIS FINDINGS Hepatobiliary: Simple cyst the RIGHT hepatic lobe. No enhancing lesion. Pancreas: Pancreas is normal. No ductal dilatation. No pancreatic inflammation. Spleen: Normal spleen Adrenals/urinary tract: Adrenal glands and kidneys are normal. The ureters and bladder normal. Stomach/Bowel: sliding-type hiatal hernia. Duodenum and small-bowel normal. Partial RIGHT hemicolectomy anatomy. No nodularity at the anastomosis. Diverticula of the sigmoid colon without acute inflammation. Rectum normal Vascular/Lymphatic: Abdominal aorta is normal caliber with atherosclerotic calcification. There is no retroperitoneal or periportal lymphadenopathy. No pelvic lymphadenopathy. Reproductive: Uterus and ovaries normal Other: No peritoneal metastasis Musculoskeletal: No aggressive osseous lesion IMPRESSION: Chest Impression: 1. Stable postsurgical change in the LEFT lung. 2. Large sliding-type hiatal hernia Abdomen / Pelvis Impression: 1. No evidence metastatic disease in the abdomen pelvis. 2. RIGHT hemicolectomy anatomy. 3. No lymphadenopathy. 4.  Aortic Atherosclerosis (ICD10-I70.0). Electronically Signed   By: StSuzy Bouchard.D.   On: 09/26/2017 13:47    ASSESSMENT AND PLAN:  This is a very  pleasant 75 years old white female with metastatic non-small cell lung cancer, adenocarcinoma diagnosed in March 2012 with positive EGFR mutation status post 68 months of treatment with Tarceva discontinued secondary to disease progression and development of EGFR T790M resistant mutation. The patient is currently  on treatment with Tagrisso 80 mg by mouth daily status post 11 months.  The patient has no significant adverse effect from this treatment. Her recent CT scan of the chest, abdomen and pelvis showed no evidence for disease progression. I discussed the scan results with the patient and her daughter and recommended for her to continue her current treatment with Tagrisso with the same dose. I will see her back for follow-up visit in 6 weeks for evaluation with repeat blood work. For the anemia of chronic disease, she was advised to continue with the oral iron tablets at regular basis. She will also continue her current treatment with Femara for the breast cancer. For hypertension, I strongly recommend for the patient to continue with her blood pressure medication and to monitor it closely with her primary care physician. She was advised to call immediately if she has any concerning symptoms in the interval. The patient voices understanding of current disease status and treatment options and is in agreement with the current care plan. All questions were answered. The patient knows to call the clinic with any problems, questions or concerns. We can certainly see the patient much sooner if necessary.  Disclaimer: This note was dictated with voice recognition software. Similar sounding words can inadvertently be transcribed and may not be corrected upon review.

## 2017-10-06 NOTE — Telephone Encounter (Signed)
Gave avs and calendar for January  °

## 2017-10-07 ENCOUNTER — Telehealth: Payer: Self-pay | Admitting: Pharmacy Technician

## 2017-10-07 NOTE — Telephone Encounter (Signed)
Oral Oncology Patient Advocate Encounter  Completed a [place] and [place] in an effort to keep the patient's out of pocket expense for Tagrisso at $0.    Application completed and faxed to [phone].   [place] patient [place] phone number for follow up is [phone].   This encounter will be updated until final determination.  [name] [name], [place] Patient [place] [phone] 10/07/2017 3:57 PM

## 2017-11-05 NOTE — Telephone Encounter (Signed)
Followed up with manufacturer assistance application status.  Was told by [place] [employer] [place] that updated [place] documents were required to complete the application process.   Kristin [name] brought 2019 Social Security Award Letter to the office this morning and those have been faxed to [name].    I will continue to follow up.   [name] [name], [name] [phone] 11/05/2017 10:50 AM

## 2017-11-13 NOTE — Telephone Encounter (Signed)
Oral Oncology Patient Advocate Encounter  Received notification from [facility] and [place] Patient Assistance program that patient has been successfully re-enrolled into their program to continue receive Tagrisso from the manufacturer at $0 out of pocket until 10/20/2018.   I left a message for the patient.  Oral Oncology Clinic will continue to follow.  Gilmore Laroche, CPhT, [place] Oral Oncology Patient Advocate [phone] 11/13/2017 11:41 AM

## 2017-11-18 ENCOUNTER — Telehealth: Payer: Self-pay

## 2017-11-18 ENCOUNTER — Inpatient Hospital Stay: Payer: Medicare Other

## 2017-11-18 ENCOUNTER — Inpatient Hospital Stay: Payer: Medicare Other | Attending: Internal Medicine | Admitting: Internal Medicine

## 2017-11-18 ENCOUNTER — Encounter: Payer: Self-pay | Admitting: Internal Medicine

## 2017-11-18 VITALS — BP 160/60 | HR 68 | Temp 97.5°F | Resp 18 | Ht 68.0 in | Wt 135.9 lb

## 2017-11-18 DIAGNOSIS — Z85038 Personal history of other malignant neoplasm of large intestine: Secondary | ICD-10-CM | POA: Insufficient documentation

## 2017-11-18 DIAGNOSIS — Z79811 Long term (current) use of aromatase inhibitors: Secondary | ICD-10-CM | POA: Insufficient documentation

## 2017-11-18 DIAGNOSIS — C3490 Malignant neoplasm of unspecified part of unspecified bronchus or lung: Secondary | ICD-10-CM | POA: Diagnosis present

## 2017-11-18 DIAGNOSIS — C50919 Malignant neoplasm of unspecified site of unspecified female breast: Secondary | ICD-10-CM | POA: Diagnosis not present

## 2017-11-18 DIAGNOSIS — C3412 Malignant neoplasm of upper lobe, left bronchus or lung: Secondary | ICD-10-CM

## 2017-11-18 DIAGNOSIS — E0590 Thyrotoxicosis, unspecified without thyrotoxic crisis or storm: Secondary | ICD-10-CM | POA: Diagnosis not present

## 2017-11-18 LAB — COMPREHENSIVE METABOLIC PANEL
ALT: 9 U/L (ref 0–55)
AST: 14 U/L (ref 5–34)
Albumin: 3.6 g/dL (ref 3.5–5.0)
Alkaline Phosphatase: 67 U/L (ref 40–150)
Anion gap: 6 (ref 3–11)
BUN: 17 mg/dL (ref 7–26)
CHLORIDE: 106 mmol/L (ref 98–109)
CO2: 27 mmol/L (ref 22–29)
CREATININE: 1.23 mg/dL — AB (ref 0.60–1.10)
Calcium: 9.7 mg/dL (ref 8.4–10.4)
GFR calc non Af Amer: 41 mL/min — ABNORMAL LOW (ref >60)
GFR, EST AFRICAN AMERICAN: 47 mL/min — AB (ref >60)
Glucose, Bld: 102 mg/dL (ref 70–140)
Potassium: 4.6 mmol/L (ref 3.3–4.7)
Sodium: 139 mmol/L (ref 136–145)
Total Bilirubin: 0.3 mg/dL (ref 0.2–1.2)
Total Protein: 6.8 g/dL (ref 6.4–8.3)

## 2017-11-18 LAB — CBC WITH DIFFERENTIAL/PLATELET
BASOS PCT: 1 %
Basophils Absolute: 0 10*3/uL (ref 0.0–0.1)
EOS PCT: 2 %
Eosinophils Absolute: 0.1 10*3/uL (ref 0.0–0.5)
HCT: 33.4 % — ABNORMAL LOW (ref 34.8–46.6)
Hemoglobin: 10.9 g/dL — ABNORMAL LOW (ref 11.6–15.9)
Lymphocytes Relative: 18 %
Lymphs Abs: 0.8 10*3/uL — ABNORMAL LOW (ref 0.9–3.3)
MCH: 28.8 pg (ref 25.1–34.0)
MCHC: 32.7 g/dL (ref 31.5–36.0)
MCV: 88.3 fL (ref 79.5–101.0)
MONO ABS: 0.6 10*3/uL (ref 0.1–0.9)
Monocytes Relative: 13 %
Neutro Abs: 3.3 10*3/uL (ref 1.5–6.5)
Neutrophils Relative %: 66 %
PLATELETS: 302 10*3/uL (ref 145–400)
RBC: 3.78 MIL/uL (ref 3.70–5.45)
RDW: 14.2 % (ref 11.2–16.1)
WBC: 4.8 10*3/uL (ref 3.9–10.3)

## 2017-11-18 NOTE — Telephone Encounter (Signed)
Printed avs and calender for upcoming appointment. Per 1/29 los 

## 2017-11-18 NOTE — Progress Notes (Signed)
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 2) adenocarcinoma of the ascending colon (T2, N0, M0) with MSI high diagnosed in March 2017. 3) history of breast adenocarcinoma.  PRIOR THERAPY:  1) Tarceva 150 mg by mouth daily status post 68 months of treatment. 2) Status post right colectomy with lymph node dissection in May 2017.  CURRENT THERAPY: 1) Tagrisso 80 mg by mouth daily started 11/09/2016. 2) observation for the history of colon adenocarcinoma. 3) Femara 2.5 mg by mouth daily for history of breast cancer.  INTERVAL HISTORY: Kristin [name] 77 y.o. female returns to the clinic today for follow-up visit accompanied by her daughter Kristin [name].  The patient is doing fine today with no specific complaints except for a few pounds of weight loss.  She denied having any current chest pain, shortness of breath, cough or hemoptysis.  She denied having any fever or chills.  She has no nausea, vomiting, diarrhea or constipation.  She was found recently to have hyper thyroidism on a nuclear medicine thyroid uptake scan performed at [facility].  She is requesting referral to endocrinology for evaluation of her condition.    MEDICAL HISTORY: Past Medical History:  Diagnosis Date  . Allergy   . Anemia   . Anemia of chronic disease 09/21/2016  . Anxiety   . Arthritis    arthritis- left hip  . Blood transfusion without reported diagnosis    transfusion- 3-4 yrs ago -found to be anemic on routine lab check  . Breast cancer ([place]) 2002   bilateral- bilateral mastectomies done.  . 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 CVA (cerebral infarction) 04/30/2013  .  Encounter for antineoplastic chemotherapy 10/28/2016  . Family history of brain cancer   . Family history of breast cancer   . Family history of colon cancer 10/06/2009  . Family history of colon cancer   . Family history of prostate cancer   . GERD (gastroesophageal reflux disease)   . Glaucoma   . History of bilateral mastectomy 05/20/2010  . Hypertension   . Lung cancer ([place]) dx'd 12/2010   last Ct scan "no lung cancer" showing 11'16 CT Chest Epic.  . Malignant neoplasm of ascending colon ([place]) 01/29/2016  . Stroke [facility])    2 yrs ago-no residual  . Tubular adenoma of colon 07/2011   colon polyps ans reoccurence with malignancy found    ALLERGIES:  is allergic to doxycycline and sulfa antibiotics.  MEDICATIONS:  Current Outpatient Medications  Medication Sig Dispense Refill  . alendronate (FOSAMAX) 70 MG tablet Take 70 mg once a week by mouth.    Marland Kitchen amLODipine (NORVASC) 5 MG tablet Take 5 mg by mouth daily. Per Pt -she takes 2.5 mg in am and 5 mg in pm    . brimonidine-timolol (COMBIGAN) 0.2-0.5 % ophthalmic solution Place 1 drop into the right eye every 12 (twelve) hours.    . Cholecalciferol (VITAMIN D3) 2000 units capsule Take 2,000 Units daily by mouth.    . clopidogrel (PLAVIX) 75 MG tablet Take 75 mg by mouth daily.    . cycloSPORINE (RESTASIS) 0.05 % ophthalmic emulsion Place 1 drop into both eyes 2 (two) times daily.    Marland Kitchen escitalopram (LEXAPRO) 10  MG tablet Take 10 mg by mouth daily.     . famotidine (PEPCID) 40 MG tablet Take 40 mg by mouth daily.    Marland Kitchen FeFum-FePoly-FA-B Cmp-C-Biot (FOLIVANE-PLUS) CAPS TAKE ONE CAPSULE BY MOUTH EACH DAY 90 capsule 2  . letrozole (FEMARA) 2.5 MG tablet TAKE ONE TABLET EACH DAY 30 tablet 3  . lisinopril (PRINIVIL,ZESTRIL) 20 MG tablet Take 1 tablet by mouth 2 (two) times daily.    Marland Kitchen osimertinib mesylate (TAGRISSO) 80 MG tablet Take 1 tablet (80 mg total) by mouth daily. 30 tablet 5   Current Facility-Administered Medications  Medication Dose  Route Frequency Provider Last Rate Last Dose  . 0.9 %  sodium chloride infusion  500 mL Intravenous Continuous Armbruster, Carlota Raspberry, MD        SURGICAL HISTORY:  Past Surgical History:  Procedure Laterality Date  . APPENDECTOMY    . cataracts Bilateral   . COLON SURGERY  02/2016  . COLONOSCOPY    . EYE SURGERY    . LUNG SURGERY     Resection -" not done. Pt . tx with Tarceva  . MASTECTOMY Bilateral   . MEDIASTINOSCOPY N/A 10/02/2016   Procedure: MEDIASTINOSCOPY;  Surgeon: [name], MD;  Location: [facility] OR;  Service: Thoracic;  Laterality: N/A;  . RECONSTRUCTION BREAST W/ TRAM FLAP Bilateral    bilaterally  . TEE WITHOUT CARDIOVERSION N/A 06/04/2013   Procedure: TRANSESOPHAGEAL ECHOCARDIOGRAM (TEE);  Surgeon: Jolaine Artist, MD;  Location[facility] ENDOSCOPY;  Service: Cardiovascular;  Laterality: N/A;  . TONSILLECTOMY    . TOTAL HIP ARTHROPLASTY Left 08/07/2016   Procedure: LEFT TOTAL HIP ARTHROPLASTY ANTERIOR APPROACH;  Surgeon: Gaynelle Arabian, MD;  Location: [facility] ORS;  Service: Orthopedics;  Laterality: Left;    REVIEW OF SYSTEMS:  A comprehensive review of systems was negative except for: Constitutional: positive for fatigue and weight loss   PHYSICAL EXAMINATION: General appearance: alert, cooperative, fatigued and no distress Head: Normocephalic, without obvious abnormality, atraumatic Neck: no adenopathy, no JVD, supple, symmetrical, trachea midline and thyroid not enlarged, symmetric, no tenderness/mass/nodules Lymph nodes: Cervical, supraclavicular, and axillary nodes normal. Resp: clear to auscultation bilaterally Back: symmetric, no curvature. ROM normal. No CVA tenderness. Cardio: regular rate and rhythm, S1, S2 normal, no murmur, click, rub or gallop GI: soft, non-tender; bowel sounds normal; no masses,  no organomegaly Extremities: extremities normal, atraumatic, no cyanosis or edema  ECOG PERFORMANCE STATUS: 1 - Symptomatic but completely ambulatory  Blood  pressure (!) 160/60, pulse 68, temperature (!) 97.5 F (36.4 C), temperature source Oral, resp. rate 18, height 5' 8"  (1.727 m), weight 135 lb 14.4 oz (61.6 kg), SpO2 100 %.  LABORATORY DATA: Lab Results  Component Value Date   WBC 4.8 11/18/2017   HGB 10.9 (L) 11/18/2017   HCT 33.4 (L) 11/18/2017   MCV 88.3 11/18/2017   PLT 302 11/18/2017      Chemistry      Component Value Date/Time   NA 138 09/26/2017 0942   K 5.2 No visable hemolysis (H) 09/26/2017 0942   CL 105 09/30/2016 1100   CL 107 04/05/2013 0754   CO2 26 09/26/2017 0942   BUN 16.0 09/26/2017 0942   CREATININE 1.2 (H) 09/26/2017 0942      Component Value Date/Time   CALCIUM 9.9 09/26/2017 0942   ALKPHOS 68 09/26/2017 0942   AST 18 09/26/2017 0942   ALT 13 09/26/2017 0942   BILITOT 0.31 09/26/2017 0942       RADIOGRAPHIC STUDIES: No results found.  ASSESSMENT AND PLAN:  This is a very pleasant 77 years old white female with metastatic non-small cell lung cancer, adenocarcinoma diagnosed in March 2012 with positive EGFR mutation status post 68 months of treatment with Tarceva discontinued secondary to disease progression and development of EGFR T790M resistant mutation. The patient is currently on treatment with Tagrisso 80 mg by mouth daily status post 12 months.  The patient continues to tolerate her treatment with Tagrisso fairly well except for mild diarrhea and fatigue. I recommended for her to continue her current treatment with Tagrisso as a schedule. For the hyperthyroidism, I would refer the patient to endocrinology for further evaluation and management of her condition. I will see her back for follow-up visit in 1 month for evaluation and repeat blood work. She was advised to call immediately if she has any concerning symptoms in the interval. The patient voices understanding of current disease status and treatment options and is in agreement with the current care plan. All questions were answered. The  patient knows to call the clinic with any problems, questions or concerns. We can certainly see the patient much sooner if necessary.  Disclaimer: This note was dictated with voice recognition software. Similar sounding words can inadvertently be transcribed and may not be corrected upon review.

## 2017-12-18 ENCOUNTER — Ambulatory Visit (INDEPENDENT_AMBULATORY_CARE_PROVIDER_SITE_OTHER): Payer: Medicare Other | Admitting: Ophthalmology

## 2017-12-18 DIAGNOSIS — D3132 Benign neoplasm of left choroid: Secondary | ICD-10-CM

## 2017-12-18 DIAGNOSIS — H353111 Nonexudative age-related macular degeneration, right eye, early dry stage: Secondary | ICD-10-CM | POA: Diagnosis not present

## 2017-12-18 DIAGNOSIS — H35373 Puckering of macula, bilateral: Secondary | ICD-10-CM | POA: Diagnosis not present

## 2017-12-18 DIAGNOSIS — H35033 Hypertensive retinopathy, bilateral: Secondary | ICD-10-CM | POA: Diagnosis not present

## 2017-12-18 DIAGNOSIS — H43813 Vitreous degeneration, bilateral: Secondary | ICD-10-CM

## 2017-12-18 DIAGNOSIS — I10 Essential (primary) hypertension: Secondary | ICD-10-CM | POA: Diagnosis not present

## 2017-12-23 ENCOUNTER — Telehealth: Payer: Self-pay | Admitting: *Deleted

## 2017-12-23 ENCOUNTER — Ambulatory Visit: Payer: Self-pay | Admitting: Endocrinology

## 2017-12-23 DIAGNOSIS — C3412 Malignant neoplasm of upper lobe, left bronchus or lung: Secondary | ICD-10-CM

## 2017-12-23 DIAGNOSIS — C182 Malignant neoplasm of ascending colon: Secondary | ICD-10-CM

## 2017-12-23 DIAGNOSIS — C50112 Malignant neoplasm of central portion of left female breast: Secondary | ICD-10-CM

## 2017-12-23 DIAGNOSIS — Z170 Estrogen receptor positive status [ER+]: Secondary | ICD-10-CM

## 2017-12-23 DIAGNOSIS — Z5111 Encounter for antineoplastic chemotherapy: Secondary | ICD-10-CM

## 2017-12-23 DIAGNOSIS — D638 Anemia in other chronic diseases classified elsewhere: Secondary | ICD-10-CM

## 2017-12-23 DIAGNOSIS — C50111 Malignant neoplasm of central portion of right female breast: Secondary | ICD-10-CM

## 2017-12-23 MED ORDER — OSIMERTINIB MESYLATE 80 MG PO TABS: 80.0000 mg | ORAL_TABLET | Freq: Every day | ORAL | 5 refills | Status: DC

## 2017-12-23 NOTE — Telephone Encounter (Signed)
Pt called [facility] states " I need my Tagrisso refilled." Last office visit 11/18/17 Pt to continue Tagrisso. Refill sent to pt pharmacy

## 2017-12-24 ENCOUNTER — Telehealth: Payer: Self-pay | Admitting: Medical Oncology

## 2017-12-24 NOTE — Telephone Encounter (Signed)
Faxed tagrisso rx to [facility]

## 2017-12-25 ENCOUNTER — Inpatient Hospital Stay: Payer: Medicare Other | Attending: Internal Medicine | Admitting: Internal Medicine

## 2017-12-25 ENCOUNTER — Encounter: Payer: Self-pay | Admitting: Internal Medicine

## 2017-12-25 ENCOUNTER — Inpatient Hospital Stay: Payer: Medicare Other

## 2017-12-25 ENCOUNTER — Telehealth: Payer: Self-pay | Admitting: Internal Medicine

## 2017-12-25 DIAGNOSIS — Z79899 Other long term (current) drug therapy: Secondary | ICD-10-CM | POA: Diagnosis not present

## 2017-12-25 DIAGNOSIS — Z9013 Acquired absence of bilateral breasts and nipples: Secondary | ICD-10-CM | POA: Insufficient documentation

## 2017-12-25 DIAGNOSIS — C182 Malignant neoplasm of ascending colon: Secondary | ICD-10-CM | POA: Insufficient documentation

## 2017-12-25 DIAGNOSIS — I10 Essential (primary) hypertension: Secondary | ICD-10-CM | POA: Diagnosis not present

## 2017-12-25 DIAGNOSIS — F418 Other specified anxiety disorders: Secondary | ICD-10-CM | POA: Insufficient documentation

## 2017-12-25 DIAGNOSIS — C3412 Malignant neoplasm of upper lobe, left bronchus or lung: Secondary | ICD-10-CM | POA: Insufficient documentation

## 2017-12-25 DIAGNOSIS — C50911 Malignant neoplasm of unspecified site of right female breast: Secondary | ICD-10-CM | POA: Diagnosis not present

## 2017-12-25 DIAGNOSIS — Z79811 Long term (current) use of aromatase inhibitors: Secondary | ICD-10-CM | POA: Insufficient documentation

## 2017-12-25 DIAGNOSIS — C50912 Malignant neoplasm of unspecified site of left female breast: Secondary | ICD-10-CM | POA: Diagnosis not present

## 2017-12-25 DIAGNOSIS — Z170 Estrogen receptor positive status [ER+]: Secondary | ICD-10-CM | POA: Insufficient documentation

## 2017-12-25 DIAGNOSIS — H409 Unspecified glaucoma: Secondary | ICD-10-CM | POA: Insufficient documentation

## 2017-12-25 DIAGNOSIS — C3490 Malignant neoplasm of unspecified part of unspecified bronchus or lung: Secondary | ICD-10-CM

## 2017-12-25 DIAGNOSIS — K219 Gastro-esophageal reflux disease without esophagitis: Secondary | ICD-10-CM | POA: Insufficient documentation

## 2017-12-25 DIAGNOSIS — Z8673 Personal history of transient ischemic attack (TIA), and cerebral infarction without residual deficits: Secondary | ICD-10-CM | POA: Diagnosis not present

## 2017-12-25 DIAGNOSIS — M1990 Unspecified osteoarthritis, unspecified site: Secondary | ICD-10-CM | POA: Insufficient documentation

## 2017-12-25 LAB — CBC WITH DIFFERENTIAL (CANCER CENTER ONLY)
Basophils Absolute: 0 10*3/uL (ref 0.0–0.1)
Basophils Relative: 1 %
EOS PCT: 2 %
Eosinophils Absolute: 0.1 10*3/uL (ref 0.0–0.5)
HCT: 33.7 % — ABNORMAL LOW (ref 34.8–46.6)
Hemoglobin: 10.8 g/dL — ABNORMAL LOW (ref 11.6–15.9)
LYMPHS ABS: 0.9 10*3/uL (ref 0.9–3.3)
LYMPHS PCT: 22 %
MCH: 28.9 pg (ref 25.1–34.0)
MCHC: 32.2 g/dL (ref 31.5–36.0)
MCV: 89.8 fL (ref 79.5–101.0)
MONO ABS: 0.7 10*3/uL (ref 0.1–0.9)
MONOS PCT: 16 %
Neutro Abs: 2.4 10*3/uL (ref 1.5–6.5)
Neutrophils Relative %: 59 %
PLATELETS: 260 10*3/uL (ref 145–400)
RBC: 3.76 MIL/uL (ref 3.70–5.45)
RDW: 15 % — AB (ref 11.2–14.5)
WBC Count: 4.1 10*3/uL (ref 3.9–10.3)

## 2017-12-25 LAB — CMP (CANCER CENTER ONLY)
ALBUMIN: 3.8 g/dL (ref 3.5–5.0)
ALT: 12 U/L (ref 0–55)
AST: 16 U/L (ref 5–34)
Alkaline Phosphatase: 73 U/L (ref 40–150)
Anion gap: 7 (ref 3–11)
BUN: 17 mg/dL (ref 7–26)
CHLORIDE: 107 mmol/L (ref 98–109)
CO2: 25 mmol/L (ref 22–29)
Calcium: 10.1 mg/dL (ref 8.4–10.4)
Creatinine: 1.16 mg/dL — ABNORMAL HIGH (ref 0.60–1.10)
GFR, Est AFR Am: 51 mL/min — ABNORMAL LOW (ref >60)
GFR, Estimated: 44 mL/min — ABNORMAL LOW (ref >60)
Glucose, Bld: 97 mg/dL (ref 70–140)
Potassium: 4.5 mmol/L (ref 3.5–5.1)
Sodium: 139 mmol/L (ref 136–145)
Total Bilirubin: 0.4 mg/dL (ref 0.2–1.2)
Total Protein: 6.9 g/dL (ref 6.4–8.3)

## 2017-12-25 NOTE — Telephone Encounter (Signed)
[place]

## 2017-12-25 NOTE — Progress Notes (Signed)
[place] [phone]) [phone]   Fax:(336) [phone]  OFFICE PROGRESS NOTE  Eber Hong, MD [address] [place] [address]  DIAGNOSIS:  1) metastatic non-small cell lung cancer, adenocarcinoma diagnosed in March 2012 with positive EGFR mutation in exon 21 (L858R). 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 2) adenocarcinoma of the ascending colon (T2, N0, M0) with MSI high diagnosed in March 2017. 3) history of breast adenocarcinoma.  PRIOR THERAPY:  1) Tarceva 150 mg by mouth daily status post 68 months of treatment. 2) Status post right colectomy with lymph node dissection in May 2017.  CURRENT THERAPY: 1) Tagrisso 80 mg by mouth daily started 11/09/2016.  Status post 14 months of treatment. 2) observation for the history of colon adenocarcinoma. 3) Femara 2.5 mg by mouth daily for history of breast cancer.  INTERVAL HISTORY: Kristin [name] 77 y.o. female returns to the clinic today for follow-up visit.  The patient is feeling fine today with no specific complaints.  She denied having any fatigue or weakness.  She denied having any chest pain, shortness of breath, cough or hemoptysis.  She has no nausea, vomiting, diarrhea or constipation.  She denied having any recent weight loss or night sweats. The patient continues to tolerate her treatment with Tagrisso fairly well.  She is here today for evaluation and repeat blood work.  MEDICAL HISTORY: Past Medical History:  Diagnosis Date  . Allergy   . Anemia   . Anemia of chronic disease 09/21/2016  . Anxiety   . Arthritis    arthritis- left hip  . Blood transfusion without reported diagnosis    transfusion- 3-4 yrs ago -found to be anemic on routine lab check  . Breast cancer ([place]) 2002   bilateral- bilateral mastectomies done.  . 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 CVA (cerebral infarction) 04/30/2013  . Encounter for antineoplastic chemotherapy 10/28/2016  . Family  history of brain cancer   . Family history of breast cancer   . Family history of colon cancer 10/06/2009  . Family history of colon cancer   . Family history of prostate cancer   . GERD (gastroesophageal reflux disease)   . Glaucoma   . History of bilateral mastectomy 05/20/2010  . Hypertension   . Lung cancer ([place]) dx'd 12/2010   last Ct scan "no lung cancer" showing 11'16 CT Chest Epic.  . Malignant neoplasm of ascending colon ([place]) 01/29/2016  . Stroke [facility])    2 yrs ago-no residual  . Tubular adenoma of colon 07/2011   colon polyps ans reoccurence with malignancy found    ALLERGIES:  is allergic to doxycycline and sulfa antibiotics.  MEDICATIONS:  Current Outpatient Medications  Medication Sig Dispense Refill  . alendronate (FOSAMAX) 70 MG tablet Take 70 mg once a week by mouth.    Marland Kitchen amLODipine (NORVASC) 5 MG tablet Take 5 mg by mouth daily. Per Pt -she takes 2.5 mg in am and 5 mg in pm    . brimonidine-timolol (COMBIGAN) 0.2-0.5 % ophthalmic solution Place 1 drop into the right eye every 12 (twelve) hours.    . Cholecalciferol (VITAMIN D3) 2000 units capsule Take 2,000 Units daily by mouth.    . clopidogrel (PLAVIX) 75 MG tablet Take 75 mg by mouth daily.    . cycloSPORINE (RESTASIS) 0.05 % ophthalmic emulsion Place 1 drop into both eyes 2 (two) times daily.    Marland Kitchen escitalopram (LEXAPRO) 10 MG tablet Take 10 mg by mouth daily.     Marland Kitchen  famotidine (PEPCID) 40 MG tablet Take 40 mg by mouth daily.    Marland Kitchen FeFum-FePoly-FA-B Cmp-C-Biot (FOLIVANE-PLUS) CAPS TAKE ONE CAPSULE BY MOUTH EACH DAY 90 capsule 2  . letrozole (FEMARA) 2.5 MG tablet TAKE ONE TABLET EACH DAY 30 tablet 3  . lisinopril (PRINIVIL,ZESTRIL) 20 MG tablet Take 1 tablet by mouth 2 (two) times daily.    Marland Kitchen osimertinib mesylate (TAGRISSO) 80 MG tablet Take 1 tablet (80 mg total) by mouth daily. 30 tablet 5   Current Facility-Administered Medications  Medication Dose Route Frequency Provider Last Rate Last Dose  . 0.9 %  sodium  chloride infusion  500 mL Intravenous Continuous Armbruster, Carlota Raspberry, MD        SURGICAL HISTORY:  Past Surgical History:  Procedure Laterality Date  . APPENDECTOMY    . cataracts Bilateral   . COLON SURGERY  02/2016  . COLONOSCOPY    . EYE SURGERY    . LUNG SURGERY     Resection -" not done. Pt . tx with Tarceva  . MASTECTOMY Bilateral   . MEDIASTINOSCOPY N/A 10/02/2016   Procedure: MEDIASTINOSCOPY;  Surgeon: [name], MD;  Location: [facility] OR;  Service: Thoracic;  Laterality: N/A;  . RECONSTRUCTION BREAST W/ TRAM FLAP Bilateral    bilaterally  . TEE WITHOUT CARDIOVERSION N/A 06/04/2013   Procedure: TRANSESOPHAGEAL ECHOCARDIOGRAM (TEE);  Surgeon: Jolaine Artist, MD;  Location: [facility] ENDOSCOPY;  Service: Cardiovascular;  Laterality: N/A;  . TONSILLECTOMY    . TOTAL HIP ARTHROPLASTY Left 08/07/2016   Procedure: LEFT TOTAL HIP ARTHROPLASTY ANTERIOR APPROACH;  Surgeon: Gaynelle Arabian, MD;  Location: [facility] ORS;  Service: Orthopedics;  Laterality: Left;    REVIEW OF SYSTEMS:  A comprehensive review of systems was negative.   PHYSICAL EXAMINATION: General appearance: alert, cooperative and no distress Head: Normocephalic, without obvious abnormality, atraumatic Neck: no adenopathy, no JVD, supple, symmetrical, trachea midline and thyroid not enlarged, symmetric, no tenderness/mass/nodules Lymph nodes: Cervical, supraclavicular, and axillary nodes normal. Resp: clear to auscultation bilaterally Back: symmetric, no curvature. ROM normal. No CVA tenderness. Cardio: regular rate and rhythm, S1, S2 normal, no murmur, click, rub or gallop GI: soft, non-tender; bowel sounds normal; no masses,  no organomegaly Extremities: extremities normal, atraumatic, no cyanosis or edema  ECOG PERFORMANCE STATUS: 1 - Symptomatic but completely ambulatory  Blood pressure (!) 157/57, pulse 65, temperature 98.6 F (37 C), temperature source Oral, resp. rate 16, height 5' 8"  (1.727 m), weight 138 lb  3.2 oz (62.7 kg), SpO2 100 %.  LABORATORY DATA: Lab Results  Component Value Date   WBC 4.1 12/25/2017   HGB 10.9 (L) 11/18/2017   HCT 33.7 (L) 12/25/2017   MCV 89.8 12/25/2017   PLT 260 12/25/2017      Chemistry      Component Value Date/Time   NA 139 12/25/2017 1020   NA 138 09/26/2017 0942   K 4.5 12/25/2017 1020   K 5.2 No visable hemolysis (H) 09/26/2017 0942   CL 107 12/25/2017 1020   CL 107 04/05/2013 0754   CO2 25 12/25/2017 1020   CO2 26 09/26/2017 0942   BUN 17 12/25/2017 1020   BUN 16.0 09/26/2017 0942   CREATININE 1.16 (H) 12/25/2017 1020   CREATININE 1.2 (H) 09/26/2017 0942      Component Value Date/Time   CALCIUM 10.1 12/25/2017 1020   CALCIUM 9.9 09/26/2017 0942   ALKPHOS 73 12/25/2017 1020   ALKPHOS 68 09/26/2017 0942   AST 16 12/25/2017 1020   AST 18 09/26/2017  0942   ALT 12 12/25/2017 1020   ALT 13 09/26/2017 0942   BILITOT 0.4 12/25/2017 1020   BILITOT 0.31 09/26/2017 0942       RADIOGRAPHIC STUDIES: No results found.  ASSESSMENT AND PLAN:  This is a very pleasant 77 years old white female with metastatic non-small cell lung cancer, adenocarcinoma diagnosed in March 2012 with positive EGFR mutation status post 68 months of treatment with Tarceva discontinued secondary to disease progression and development of EGFR T790M resistant mutation. The patient is currently on treatment with Tagrisso 80 mg by mouth daily status post 14 months.  The patient continues to tolerate this treatment fairly well. I recommended for her to proceed with Tagrisso at the same dose for now. I will see her back for follow-up visit in 1 month for evaluation after repeating CT scan of the chest, abdomen and pelvis for restaging of her disease. She was advised to call immediately if she has any concerning symptoms in the interval. The patient voices understanding of current disease status and treatment options and is in agreement with the current care plan. All questions  were answered. The patient knows to call the clinic with any problems, questions or concerns. We can certainly see the patient much sooner if necessary.  Disclaimer: This note was dictated with voice recognition software. Similar sounding words can inadvertently be transcribed and may not be corrected upon review.

## 2017-12-25 NOTE — Telephone Encounter (Signed)
Gave patient avs and calendar with appts per 3/7 los.  °

## 2017-12-29 ENCOUNTER — Telehealth: Payer: Self-pay | Admitting: *Deleted

## 2017-12-29 NOTE — Telephone Encounter (Signed)
Call from Pt states she has not received her Tagrisso.  Called [facility], order was processed 3/9 will ship out and deliver by Wednesday 3/13. Notified pt

## 2018-01-23 ENCOUNTER — Encounter (HOSPITAL_COMMUNITY): Payer: Self-pay

## 2018-01-23 ENCOUNTER — Ambulatory Visit (HOSPITAL_COMMUNITY)
Admission: RE | Admit: 2018-01-23 | Discharge: 2018-01-23 | Disposition: A | Discharge status: Home / Self Care | Payer: Medicare Other | Source: Ambulatory Visit | Attending: Internal Medicine | Admitting: Internal Medicine

## 2018-01-23 ENCOUNTER — Inpatient Hospital Stay: Payer: Medicare Other | Attending: Internal Medicine

## 2018-01-23 DIAGNOSIS — Z800 Family history of malignant neoplasm of digestive organs: Secondary | ICD-10-CM | POA: Insufficient documentation

## 2018-01-23 DIAGNOSIS — C3490 Malignant neoplasm of unspecified part of unspecified bronchus or lung: Secondary | ICD-10-CM

## 2018-01-23 DIAGNOSIS — Z803 Family history of malignant neoplasm of breast: Secondary | ICD-10-CM | POA: Diagnosis not present

## 2018-01-23 DIAGNOSIS — Z79899 Other long term (current) drug therapy: Secondary | ICD-10-CM | POA: Diagnosis not present

## 2018-01-23 DIAGNOSIS — Z9221 Personal history of antineoplastic chemotherapy: Secondary | ICD-10-CM | POA: Diagnosis not present

## 2018-01-23 DIAGNOSIS — Z9013 Acquired absence of bilateral breasts and nipples: Secondary | ICD-10-CM | POA: Insufficient documentation

## 2018-01-23 DIAGNOSIS — K219 Gastro-esophageal reflux disease without esophagitis: Secondary | ICD-10-CM | POA: Diagnosis not present

## 2018-01-23 DIAGNOSIS — C50912 Malignant neoplasm of unspecified site of left female breast: Secondary | ICD-10-CM | POA: Insufficient documentation

## 2018-01-23 DIAGNOSIS — I10 Essential (primary) hypertension: Secondary | ICD-10-CM | POA: Insufficient documentation

## 2018-01-23 DIAGNOSIS — Z8673 Personal history of transient ischemic attack (TIA), and cerebral infarction without residual deficits: Secondary | ICD-10-CM | POA: Diagnosis not present

## 2018-01-23 DIAGNOSIS — C50911 Malignant neoplasm of unspecified site of right female breast: Secondary | ICD-10-CM | POA: Insufficient documentation

## 2018-01-23 DIAGNOSIS — Z79811 Long term (current) use of aromatase inhibitors: Secondary | ICD-10-CM | POA: Diagnosis not present

## 2018-01-23 DIAGNOSIS — Z170 Estrogen receptor positive status [ER+]: Secondary | ICD-10-CM | POA: Insufficient documentation

## 2018-01-23 DIAGNOSIS — F418 Other specified anxiety disorders: Secondary | ICD-10-CM | POA: Diagnosis not present

## 2018-01-23 DIAGNOSIS — C3412 Malignant neoplasm of upper lobe, left bronchus or lung: Secondary | ICD-10-CM | POA: Diagnosis not present

## 2018-01-23 DIAGNOSIS — M1990 Unspecified osteoarthritis, unspecified site: Secondary | ICD-10-CM | POA: Insufficient documentation

## 2018-01-23 DIAGNOSIS — C182 Malignant neoplasm of ascending colon: Secondary | ICD-10-CM | POA: Insufficient documentation

## 2018-01-23 LAB — CBC WITH DIFFERENTIAL (CANCER CENTER ONLY)
Basophils Absolute: 0 10*3/uL (ref 0.0–0.1)
Basophils Relative: 1 %
EOS ABS: 0.2 10*3/uL (ref 0.0–0.5)
EOS PCT: 3 %
HCT: 31.4 % — ABNORMAL LOW (ref 34.8–46.6)
Hemoglobin: 10.3 g/dL — ABNORMAL LOW (ref 11.6–15.9)
LYMPHS ABS: 0.9 10*3/uL (ref 0.9–3.3)
LYMPHS PCT: 17 %
MCH: 29.8 pg (ref 25.1–34.0)
MCHC: 32.9 g/dL (ref 31.5–36.0)
MCV: 90.6 fL (ref 79.5–101.0)
MONO ABS: 0.7 10*3/uL (ref 0.1–0.9)
Monocytes Relative: 14 %
Neutro Abs: 3.3 10*3/uL (ref 1.5–6.5)
Neutrophils Relative %: 65 %
PLATELETS: 247 10*3/uL (ref 145–400)
RBC: 3.47 MIL/uL — AB (ref 3.70–5.45)
RDW: 14.9 % — AB (ref 11.2–14.5)
WBC: 5.2 10*3/uL (ref 3.9–10.3)

## 2018-01-23 LAB — CMP (CANCER CENTER ONLY)
ALT: 12 U/L (ref 0–55)
AST: 15 U/L (ref 5–34)
Albumin: 3.6 g/dL (ref 3.5–5.0)
Alkaline Phosphatase: 74 U/L (ref 40–150)
Anion gap: 7 (ref 3–11)
BILIRUBIN TOTAL: 0.3 mg/dL (ref 0.2–1.2)
BUN: 21 mg/dL (ref 7–26)
CO2: 25 mmol/L (ref 22–29)
CREATININE: 1.29 mg/dL — AB (ref 0.60–1.10)
Calcium: 9.7 mg/dL (ref 8.4–10.4)
Chloride: 107 mmol/L (ref 98–109)
GFR, EST NON AFRICAN AMERICAN: 39 mL/min — AB (ref >60)
GFR, Est AFR Am: 45 mL/min — ABNORMAL LOW (ref >60)
GLUCOSE: 100 mg/dL (ref 70–140)
Potassium: 4.6 mmol/L (ref 3.5–5.1)
Sodium: 139 mmol/L (ref 136–145)
TOTAL PROTEIN: 6.5 g/dL (ref 6.4–8.3)

## 2018-01-23 MED ORDER — IOHEXOL 300 MG/ML  SOLN
100.0000 mL | Freq: Once | INTRAMUSCULAR | Status: AC | PRN
  Administered 2018-01-23: 100 mL via INTRAVENOUS

## 2018-01-26 ENCOUNTER — Telehealth: Payer: Self-pay | Admitting: Internal Medicine

## 2018-01-26 ENCOUNTER — Inpatient Hospital Stay (HOSPITAL_BASED_OUTPATIENT_CLINIC_OR_DEPARTMENT_OTHER): Payer: Medicare Other | Admitting: Internal Medicine

## 2018-01-26 ENCOUNTER — Other Ambulatory Visit: Payer: Self-pay | Admitting: Medical Oncology

## 2018-01-26 ENCOUNTER — Encounter: Payer: Self-pay | Admitting: Internal Medicine

## 2018-01-26 VITALS — BP 154/43 | HR 64 | Temp 98.4°F | Resp 16 | Ht 68.0 in | Wt 141.0 lb

## 2018-01-26 DIAGNOSIS — M1990 Unspecified osteoarthritis, unspecified site: Secondary | ICD-10-CM

## 2018-01-26 DIAGNOSIS — C3412 Malignant neoplasm of upper lobe, left bronchus or lung: Secondary | ICD-10-CM | POA: Diagnosis not present

## 2018-01-26 DIAGNOSIS — Z9013 Acquired absence of bilateral breasts and nipples: Secondary | ICD-10-CM

## 2018-01-26 DIAGNOSIS — C50111 Malignant neoplasm of central portion of right female breast: Secondary | ICD-10-CM

## 2018-01-26 DIAGNOSIS — C50112 Malignant neoplasm of central portion of left female breast: Secondary | ICD-10-CM

## 2018-01-26 DIAGNOSIS — Z79811 Long term (current) use of aromatase inhibitors: Secondary | ICD-10-CM | POA: Diagnosis not present

## 2018-01-26 DIAGNOSIS — F418 Other specified anxiety disorders: Secondary | ICD-10-CM | POA: Diagnosis not present

## 2018-01-26 DIAGNOSIS — K219 Gastro-esophageal reflux disease without esophagitis: Secondary | ICD-10-CM

## 2018-01-26 DIAGNOSIS — C50911 Malignant neoplasm of unspecified site of right female breast: Secondary | ICD-10-CM | POA: Diagnosis not present

## 2018-01-26 DIAGNOSIS — C182 Malignant neoplasm of ascending colon: Secondary | ICD-10-CM

## 2018-01-26 DIAGNOSIS — C50912 Malignant neoplasm of unspecified site of left female breast: Secondary | ICD-10-CM | POA: Diagnosis not present

## 2018-01-26 DIAGNOSIS — Z170 Estrogen receptor positive status [ER+]: Secondary | ICD-10-CM | POA: Diagnosis not present

## 2018-01-26 DIAGNOSIS — Z9221 Personal history of antineoplastic chemotherapy: Secondary | ICD-10-CM | POA: Diagnosis not present

## 2018-01-26 DIAGNOSIS — Z800 Family history of malignant neoplasm of digestive organs: Secondary | ICD-10-CM

## 2018-01-26 DIAGNOSIS — I10 Essential (primary) hypertension: Secondary | ICD-10-CM | POA: Diagnosis not present

## 2018-01-26 DIAGNOSIS — Z79899 Other long term (current) drug therapy: Secondary | ICD-10-CM

## 2018-01-26 DIAGNOSIS — D638 Anemia in other chronic diseases classified elsewhere: Secondary | ICD-10-CM

## 2018-01-26 DIAGNOSIS — Z8673 Personal history of transient ischemic attack (TIA), and cerebral infarction without residual deficits: Secondary | ICD-10-CM

## 2018-01-26 DIAGNOSIS — Z803 Family history of malignant neoplasm of breast: Secondary | ICD-10-CM

## 2018-01-26 DIAGNOSIS — Z5111 Encounter for antineoplastic chemotherapy: Secondary | ICD-10-CM

## 2018-01-26 MED ORDER — OSIMERTINIB MESYLATE 80 MG PO TABS: 80.0000 mg | ORAL_TABLET | Freq: Every day | ORAL | 0 refills | Status: DC

## 2018-01-26 NOTE — Telephone Encounter (Signed)
Appt scheduled AVs/Calendar printed per 4/8 los

## 2018-01-26 NOTE — Progress Notes (Signed)
[place] Telephone:(336) [phone]   Fax:(336) [phone]  OFFICE PROGRESS NOTE  Eber Hong, MD [address] [place] [address]  DIAGNOSIS:  1) metastatic non-small cell lung cancer, adenocarcinoma diagnosed in March 2012 with positive EGFR mutation in exon 21 (L858R). 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 2) adenocarcinoma of the ascending colon (T2, N0, M0) with MSI high diagnosed in March 2017. 3) history of breast adenocarcinoma.  PRIOR THERAPY:  1) Tarceva 150 mg by mouth daily status post 68 months of treatment. 2) Status post right colectomy with lymph node dissection in May 2017.  CURRENT THERAPY: 1) Tagrisso 80 mg by mouth daily started 11/09/2016.  Status post 15 months of treatment. 2) observation for the history of colon adenocarcinoma. 3) Femara 2.5 mg by mouth daily for history of breast cancer.  INTERVAL HISTORY: Kristin [name] 77 y.o. female returns to the clinic today for follow-up visit accompanied by her daughter [name].  The patient is feeling fine today with no specific complaints.  She denied having any significant weight loss or night sweats.  She denied having any fatigue.  She has no chest pain, shortness of breath, cough or hemoptysis.  She has no fever or chills.  The patient denied having any skin rash or diarrhea.  She has no nausea, vomiting or constipation.  She continues to tolerate her treatment with Tagrisso fairly well with no significant adverse effects.  MEDICAL HISTORY: Past Medical History:  Diagnosis Date  . Allergy   . Anemia   . Anemia of chronic disease 09/21/2016  . Anxiety   . Arthritis    arthritis- left hip  . Blood transfusion without reported diagnosis    transfusion- 3-4 yrs ago -found to be anemic on routine lab check  . Breast cancer ([place]) 2002   bilateral- bilateral mastectomies done.  . 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 CVA (cerebral infarction) 04/30/2013  .  Encounter for antineoplastic chemotherapy 10/28/2016  . Family history of brain cancer   . Family history of breast cancer   . Family history of colon cancer 10/06/2009  . Family history of colon cancer   . Family history of prostate cancer   . GERD (gastroesophageal reflux disease)   . Glaucoma   . History of bilateral mastectomy 05/20/2010  . Hypertension   . Lung cancer ([place]) dx'd 12/2010   last Ct scan "no lung cancer" showing 11'16 CT Chest Epic.  . Malignant neoplasm of ascending colon ([place]) 01/29/2016  . Stroke [facility])    2 yrs ago-no residual  . Tubular adenoma of colon 07/2011   colon polyps ans reoccurence with malignancy found    ALLERGIES:  is allergic to doxycycline and sulfa antibiotics.  MEDICATIONS:  Current Outpatient Medications  Medication Sig Dispense Refill  . alendronate (FOSAMAX) 70 MG tablet Take 70 mg once a week by mouth.    Marland Kitchen amLODipine (NORVASC) 5 MG tablet Take 5 mg by mouth daily. Per Pt -she takes 2.5 mg in am and 5 mg in pm    . brimonidine-timolol (COMBIGAN) 0.2-0.5 % ophthalmic solution Place 1 drop into the right eye every 12 (twelve) hours.    . Cholecalciferol (VITAMIN D3) 2000 units capsule Take 2,000 Units daily by mouth.    . clopidogrel (PLAVIX) 75 MG tablet Take 75 mg by mouth daily.    . cycloSPORINE (RESTASIS) 0.05 % ophthalmic emulsion Place 1 drop into both eyes 2 (two) times daily.    Marland Kitchen  escitalopram (LEXAPRO) 10 MG tablet Take 10 mg by mouth daily.     . famotidine (PEPCID) 40 MG tablet Take 40 mg by mouth daily.    Marland Kitchen FeFum-FePoly-FA-B Cmp-C-Biot (FOLIVANE-PLUS) CAPS TAKE ONE CAPSULE BY MOUTH EACH DAY 90 capsule 2  . letrozole (FEMARA) 2.5 MG tablet TAKE ONE TABLET EACH DAY 30 tablet 3  . lisinopril (PRINIVIL,ZESTRIL) 20 MG tablet Take 1 tablet by mouth 2 (two) times daily.    Marland Kitchen osimertinib mesylate (TAGRISSO) 80 MG tablet Take 1 tablet (80 mg total) by mouth daily. 30 tablet 0   Current Facility-Administered Medications  Medication Dose  Route Frequency Provider Last Rate Last Dose  . 0.9 %  sodium chloride infusion  500 mL Intravenous Continuous Armbruster, Carlota Raspberry, MD        SURGICAL HISTORY:  Past Surgical History:  Procedure Laterality Date  . APPENDECTOMY    . cataracts Bilateral   . COLON SURGERY  02/2016  . COLONOSCOPY    . EYE SURGERY    . LUNG SURGERY     Resection -" not done. Pt . tx with Tarceva  . MASTECTOMY Bilateral   . MEDIASTINOSCOPY N/A 10/02/2016   Procedure: MEDIASTINOSCOPY;  Surgeon: [name], MD;  Location: [facility] OR;  Service: Thoracic;  Laterality: N/A;  . RECONSTRUCTION BREAST W/ TRAM FLAP Bilateral    bilaterally  . TEE WITHOUT CARDIOVERSION N/A 06/04/2013   Procedure: TRANSESOPHAGEAL ECHOCARDIOGRAM (TEE);  Surgeon: Jolaine Artist, MD;  Location: [facility] ENDOSCOPY;  Service: Cardiovascular;  Laterality: N/A;  . TONSILLECTOMY    . TOTAL HIP ARTHROPLASTY Left 08/07/2016   Procedure: LEFT TOTAL HIP ARTHROPLASTY ANTERIOR APPROACH;  Surgeon: Gaynelle Arabian, MD;  Location: [facility] ORS;  Service: Orthopedics;  Laterality: Left;    REVIEW OF SYSTEMS:  Constitutional: negative Eyes: negative Ears, nose, mouth, throat, and face: negative Respiratory: negative Cardiovascular: negative Gastrointestinal: negative Genitourinary:negative Integument/breast: negative Hematologic/lymphatic: negative Musculoskeletal:negative Neurological: negative Behavioral/Psych: negative Endocrine: negative Allergic/Immunologic: negative   PHYSICAL EXAMINATION: General appearance: alert, cooperative and no distress Head: Normocephalic, without obvious abnormality, atraumatic Neck: no adenopathy, no JVD, supple, symmetrical, trachea midline and thyroid not enlarged, symmetric, no tenderness/mass/nodules Lymph nodes: Cervical, supraclavicular, and axillary nodes normal. Resp: clear to auscultation bilaterally Back: symmetric, no curvature. ROM normal. No CVA tenderness. Cardio: regular rate and rhythm, S1, S2  normal, no murmur, click, rub or gallop GI: soft, non-tender; bowel sounds normal; no masses,  no organomegaly Extremities: extremities normal, atraumatic, no cyanosis or edema Neurologic: Alert and oriented X 3, normal strength and tone. Normal symmetric reflexes. Normal coordination and gait  ECOG PERFORMANCE STATUS: 1 - Symptomatic but completely ambulatory  Blood pressure (!) 154/43, pulse 64, temperature 98.4 F (36.9 C), temperature source Oral, resp. rate 16, height 5' 8"  (1.727 m), weight 141 lb (64 kg), SpO2 100 %.  LABORATORY DATA: Lab Results  Component Value Date   WBC 5.2 01/23/2018   HGB 10.9 (L) 11/18/2017   HCT 31.4 (L) 01/23/2018   MCV 90.6 01/23/2018   PLT 247 01/23/2018      Chemistry      Component Value Date/Time   NA 139 01/23/2018 0954   NA 138 09/26/2017 0942   K 4.6 01/23/2018 0954   K 5.2 No visable hemolysis (H) 09/26/2017 0942   CL 107 01/23/2018 0954   CL 107 04/05/2013 0754   CO2 25 01/23/2018 0954   CO2 26 09/26/2017 0942   BUN 21 01/23/2018 0954   BUN 16.0 09/26/2017 0942  CREATININE 1.29 (H) 01/23/2018 0954   CREATININE 1.2 (H) 09/26/2017 0942      Component Value Date/Time   CALCIUM 9.7 01/23/2018 0954   CALCIUM 9.9 09/26/2017 0942   ALKPHOS 74 01/23/2018 0954   ALKPHOS 68 09/26/2017 0942   AST 15 01/23/2018 0954   AST 18 09/26/2017 0942   ALT 12 01/23/2018 0954   ALT 13 09/26/2017 0942   BILITOT 0.3 01/23/2018 0954   BILITOT 0.31 09/26/2017 0942       RADIOGRAPHIC STUDIES: Ct Chest W Contrast  Result Date: 01/23/2018 CLINICAL DATA:  Patient with history of lung cancer diagnosed in 2013, breast cancer 2012 and colon cancer 2017. On chemotherapy. Follow-up evaluation. EXAM: CT CHEST, ABDOMEN, AND PELVIS WITH CONTRAST TECHNIQUE: Multidetector CT imaging of the chest, abdomen and pelvis was performed following the standard protocol during bolus administration of intravenous contrast. CONTRAST:  121m OMNIPAQUE IOHEXOL 300 MG/ML   SOLN COMPARISON:  CT CAP 09/26/2017. FINDINGS: CT CHEST FINDINGS Cardiovascular: Normal heart size. No pericardial effusion. Coronary arterial vascular calcifications. Mediastinum/Nodes: No enlarged axillary, mediastinal or hilar lymphadenopathy. Large hiatal hernia. Lungs/Pleura: Central airways are patent. Dependent atelectasis within the bilateral lower lobes. Stable small nodularity along the left fissure. Postsurgical changes left upper hemithorax, unchanged. No pleural effusion or pneumothorax. Slight increased atelectasis right middle lobe. Musculoskeletal: No aggressive or acute appearing osseous lesions. Thoracic spine degenerative changes. CT ABDOMEN PELVIS FINDINGS Hepatobiliary: Stable cysts within the hepatic dome. No new suspicious pack lesions identified. Gallbladder is unremarkable. No intrahepatic or extrahepatic biliary ductal dilatation. Pancreas: Unremarkable Spleen: Unremarkable Adrenals/Urinary Tract: Adrenal glands are normal. Kidneys enhance symmetrically with contrast. No hydronephrosis. Urinary bladder is unremarkable. Stomach/Bowel: Sigmoid colonic diverticulosis. No CT evidence for acute diverticulitis. Oral contrast material to the level of the rectum. Patient status post ascending colectomy. No evidence for small bowel obstruction. No free fluid or free intraperitoneal air. Vascular/Lymphatic: Normal caliber abdominal aorta. Peripheral calcified atherosclerotic plaque. No retroperitoneal lymphadenopathy. Reproductive: Calcified fibroids within the uterus. Adnexal structures unremarkable. Other: None. Musculoskeletal: Stable left hip arthroplasty. Lumbar spine degenerative changes. No aggressive or acute appearing osseous lesions. IMPRESSION: 1. Stable postsurgical changes left lung. 2. Stable postsurgical changes compatible with right hemicolectomy. 3. Overall stable CT of the chest, abdomen and pelvis. 4. Electronically Signed   By: DLovey NewcomerM.D.   On: 01/23/2018 15:12   Ct  Abdomen Pelvis W Contrast  Result Date: 01/23/2018 CLINICAL DATA:  Patient with history of lung cancer diagnosed in 2013, breast cancer 2012 and colon cancer 2017. On chemotherapy. Follow-up evaluation. EXAM: CT CHEST, ABDOMEN, AND PELVIS WITH CONTRAST TECHNIQUE: Multidetector CT imaging of the chest, abdomen and pelvis was performed following the standard protocol during bolus administration of intravenous contrast. CONTRAST:  1011mOMNIPAQUE IOHEXOL 300 MG/ML  SOLN COMPARISON:  CT CAP 09/26/2017. FINDINGS: CT CHEST FINDINGS Cardiovascular: Normal heart size. No pericardial effusion. Coronary arterial vascular calcifications. Mediastinum/Nodes: No enlarged axillary, mediastinal or hilar lymphadenopathy. Large hiatal hernia. Lungs/Pleura: Central airways are patent. Dependent atelectasis within the bilateral lower lobes. Stable small nodularity along the left fissure. Postsurgical changes left upper hemithorax, unchanged. No pleural effusion or pneumothorax. Slight increased atelectasis right middle lobe. Musculoskeletal: No aggressive or acute appearing osseous lesions. Thoracic spine degenerative changes. CT ABDOMEN PELVIS FINDINGS Hepatobiliary: Stable cysts within the hepatic dome. No new suspicious pack lesions identified. Gallbladder is unremarkable. No intrahepatic or extrahepatic biliary ductal dilatation. Pancreas: Unremarkable Spleen: Unremarkable Adrenals/Urinary Tract: Adrenal glands are normal. Kidneys enhance symmetrically with contrast. No hydronephrosis. Urinary  bladder is unremarkable. Stomach/Bowel: Sigmoid colonic diverticulosis. No CT evidence for acute diverticulitis. Oral contrast material to the level of the rectum. Patient status post ascending colectomy. No evidence for small bowel obstruction. No free fluid or free intraperitoneal air. Vascular/Lymphatic: Normal caliber abdominal aorta. Peripheral calcified atherosclerotic plaque. No retroperitoneal lymphadenopathy. Reproductive: Calcified  fibroids within the uterus. Adnexal structures unremarkable. Other: None. Musculoskeletal: Stable left hip arthroplasty. Lumbar spine degenerative changes. No aggressive or acute appearing osseous lesions. IMPRESSION: 1. Stable postsurgical changes left lung. 2. Stable postsurgical changes compatible with right hemicolectomy. 3. Overall stable CT of the chest, abdomen and pelvis. 4. Electronically Signed   By: Lovey Newcomer M.D.   On: 01/23/2018 15:12    ASSESSMENT AND PLAN:  This is a very pleasant 77 years old white female with metastatic non-small cell lung cancer, adenocarcinoma diagnosed in March 2012 with positive EGFR mutation status post 68 months of treatment with Tarceva discontinued secondary to disease progression and development of EGFR T790M resistant mutation. The patient is currently on treatment with Tagrisso 80 mg by mouth daily status post 15 months.  She continues to tolerate this treatment well with no concerning complaints.  She had repeat CT scan of the chest, abdomen and pelvis performed recently.  I personally and independently reviewed the scans and discussed the results with the patient and her daughter. Her scan showed no concerning findings for disease progression.  I recommended for the patient to continue her current treatment with Tagrisso with the same dose. For the history of breast cancer, she will continue on Femara. The patient will come back for follow-up visit in 1 month for evaluation with repeat blood work. She was advised to call immediately if she has any concerning symptoms in the interval. The patient voices understanding of current disease status and treatment options and is in agreement with the current care plan. All questions were answered. The patient knows to call the clinic with any problems, questions or concerns. We can certainly see the patient much sooner if necessary.  Disclaimer: This note was dictated with voice recognition software. Similar  sounding words can inadvertently be transcribed and may not be corrected upon review.

## 2018-02-04 ENCOUNTER — Other Ambulatory Visit: Payer: Self-pay | Admitting: Medical Oncology

## 2018-02-04 DIAGNOSIS — D638 Anemia in other chronic diseases classified elsewhere: Secondary | ICD-10-CM

## 2018-02-04 DIAGNOSIS — C3412 Malignant neoplasm of upper lobe, left bronchus or lung: Secondary | ICD-10-CM

## 2018-02-04 DIAGNOSIS — Z170 Estrogen receptor positive status [ER+]: Secondary | ICD-10-CM

## 2018-02-04 DIAGNOSIS — C182 Malignant neoplasm of ascending colon: Secondary | ICD-10-CM

## 2018-02-04 DIAGNOSIS — Z5111 Encounter for antineoplastic chemotherapy: Secondary | ICD-10-CM

## 2018-02-04 DIAGNOSIS — C50112 Malignant neoplasm of central portion of left female breast: Secondary | ICD-10-CM

## 2018-02-04 DIAGNOSIS — C50111 Malignant neoplasm of central portion of right female breast: Secondary | ICD-10-CM

## 2018-03-02 ENCOUNTER — Inpatient Hospital Stay: Payer: Medicare Other | Attending: Internal Medicine

## 2018-03-02 ENCOUNTER — Telehealth: Payer: Self-pay | Admitting: Internal Medicine

## 2018-03-02 ENCOUNTER — Encounter: Payer: Self-pay | Admitting: Internal Medicine

## 2018-03-02 ENCOUNTER — Inpatient Hospital Stay (HOSPITAL_BASED_OUTPATIENT_CLINIC_OR_DEPARTMENT_OTHER): Payer: Medicare Other | Admitting: Internal Medicine

## 2018-03-02 VITALS — BP 159/57 | HR 66 | Temp 98.1°F | Resp 16 | Ht 68.0 in | Wt 135.8 lb

## 2018-03-02 DIAGNOSIS — C3412 Malignant neoplasm of upper lobe, left bronchus or lung: Secondary | ICD-10-CM

## 2018-03-02 DIAGNOSIS — R630 Anorexia: Secondary | ICD-10-CM | POA: Diagnosis not present

## 2018-03-02 DIAGNOSIS — Z79811 Long term (current) use of aromatase inhibitors: Secondary | ICD-10-CM | POA: Diagnosis not present

## 2018-03-02 DIAGNOSIS — C50911 Malignant neoplasm of unspecified site of right female breast: Secondary | ICD-10-CM | POA: Insufficient documentation

## 2018-03-02 DIAGNOSIS — R634 Abnormal weight loss: Secondary | ICD-10-CM | POA: Diagnosis not present

## 2018-03-02 DIAGNOSIS — I10 Essential (primary) hypertension: Secondary | ICD-10-CM

## 2018-03-02 DIAGNOSIS — F419 Anxiety disorder, unspecified: Secondary | ICD-10-CM | POA: Insufficient documentation

## 2018-03-02 DIAGNOSIS — C50912 Malignant neoplasm of unspecified site of left female breast: Secondary | ICD-10-CM | POA: Diagnosis not present

## 2018-03-02 DIAGNOSIS — K219 Gastro-esophageal reflux disease without esophagitis: Secondary | ICD-10-CM

## 2018-03-02 DIAGNOSIS — C182 Malignant neoplasm of ascending colon: Secondary | ICD-10-CM

## 2018-03-02 DIAGNOSIS — Z79899 Other long term (current) drug therapy: Secondary | ICD-10-CM | POA: Insufficient documentation

## 2018-03-02 DIAGNOSIS — Z8673 Personal history of transient ischemic attack (TIA), and cerebral infarction without residual deficits: Secondary | ICD-10-CM

## 2018-03-02 DIAGNOSIS — Z800 Family history of malignant neoplasm of digestive organs: Secondary | ICD-10-CM | POA: Diagnosis not present

## 2018-03-02 DIAGNOSIS — Z9013 Acquired absence of bilateral breasts and nipples: Secondary | ICD-10-CM

## 2018-03-02 DIAGNOSIS — Z9221 Personal history of antineoplastic chemotherapy: Secondary | ICD-10-CM | POA: Insufficient documentation

## 2018-03-02 DIAGNOSIS — M1990 Unspecified osteoarthritis, unspecified site: Secondary | ICD-10-CM

## 2018-03-02 DIAGNOSIS — Z803 Family history of malignant neoplasm of breast: Secondary | ICD-10-CM | POA: Insufficient documentation

## 2018-03-02 DIAGNOSIS — Z170 Estrogen receptor positive status [ER+]: Secondary | ICD-10-CM

## 2018-03-02 LAB — CMP (CANCER CENTER ONLY)
ALBUMIN: 3.6 g/dL (ref 3.5–5.0)
ALK PHOS: 63 U/L (ref 40–150)
ALT: 9 U/L (ref 0–55)
ANION GAP: 5 (ref 3–11)
AST: 12 U/L (ref 5–34)
BUN: 19 mg/dL (ref 7–26)
CALCIUM: 9.6 mg/dL (ref 8.4–10.4)
CHLORIDE: 108 mmol/L (ref 98–109)
CO2: 26 mmol/L (ref 22–29)
Creatinine: 1.3 mg/dL — ABNORMAL HIGH (ref 0.60–1.10)
GFR, Est AFR Am: 44 mL/min — ABNORMAL LOW (ref >60)
GFR, Estimated: 38 mL/min — ABNORMAL LOW (ref >60)
GLUCOSE: 104 mg/dL (ref 70–140)
POTASSIUM: 4.9 mmol/L (ref 3.5–5.1)
SODIUM: 139 mmol/L (ref 136–145)
Total Bilirubin: 0.2 mg/dL (ref 0.2–1.2)
Total Protein: 6.6 g/dL (ref 6.4–8.3)

## 2018-03-02 LAB — CBC WITH DIFFERENTIAL (CANCER CENTER ONLY)
Basophils Absolute: 0 10*3/uL (ref 0.0–0.1)
Basophils Relative: 1 %
Eosinophils Absolute: 0.1 10*3/uL (ref 0.0–0.5)
Eosinophils Relative: 2 %
HEMATOCRIT: 31.8 % — AB (ref 34.8–46.6)
Hemoglobin: 10.4 g/dL — ABNORMAL LOW (ref 11.6–15.9)
LYMPHS ABS: 0.7 10*3/uL — AB (ref 0.9–3.3)
LYMPHS PCT: 16 %
MCH: 29.5 pg (ref 25.1–34.0)
MCHC: 32.8 g/dL (ref 31.5–36.0)
MCV: 89.7 fL (ref 79.5–101.0)
MONO ABS: 0.6 10*3/uL (ref 0.1–0.9)
MONOS PCT: 15 %
NEUTROS ABS: 2.9 10*3/uL (ref 1.5–6.5)
Neutrophils Relative %: 66 %
Platelet Count: 259 10*3/uL (ref 145–400)
RBC: 3.54 MIL/uL — ABNORMAL LOW (ref 3.70–5.45)
RDW: 13.9 % (ref 11.2–14.5)
WBC Count: 4.3 10*3/uL (ref 3.9–10.3)

## 2018-03-02 NOTE — Telephone Encounter (Signed)
Scheduled appt per 5/13 los - Gave patient aVS and calender per los.  

## 2018-03-02 NOTE — Progress Notes (Signed)
Kristin [name] [phone]   Fax:([phone]  OFFICE PROGRESS NOTE  Eber Hong, MD [address] [place] [address]  DIAGNOSIS:  1) metastatic non-small cell lung cancer, adenocarcinoma diagnosed in March 2012 with positive EGFR mutation in exon 21 (L858R). 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 2) adenocarcinoma of the ascending colon (T2, N0, M0) with MSI high diagnosed in March 2017. 3) history of breast adenocarcinoma.  PRIOR THERAPY:  1) Tarceva 150 mg by mouth daily status post 68 months of treatment. 2) Status post right colectomy with lymph node dissection in May 2017.  CURRENT THERAPY: 1) Tagrisso 80 mg by mouth daily started 11/09/2016.  Status post 16 months of treatment. 2) observation for the history of colon adenocarcinoma. 3) Femara 2.5 mg by mouth daily for history of breast cancer.  INTERVAL HISTORY: Kristin [name] 77 y.o. female returns to the clinic today for follow-up visit.  The patient is feeling fine today with no specific complaints.  She denied having any chest pain, shortness of breath, cough or hemoptysis.  She denied having any nausea, vomiting, diarrhea or constipation.  She lost few pounds secondary to lack of appetite.  She denied having any fever or chills.  She has no headache or visual changes.  She is here today for evaluation and repeat blood work.   MEDICAL HISTORY: Past Medical History:  Diagnosis Date  . Allergy   . Anemia   . Anemia of chronic disease 09/21/2016  . Anxiety   . Arthritis    arthritis- left hip  . Blood transfusion without reported diagnosis    transfusion- 3-4 yrs ago -found to be anemic on routine lab check  . Breast cancer ([place]) 2002   bilateral- bilateral mastectomies done.  . 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 CVA (cerebral infarction) 04/30/2013  . Encounter for antineoplastic chemotherapy 10/28/2016  . Family history of brain cancer   . Family  history of breast cancer   . Family history of colon cancer 10/06/2009  . Family history of colon cancer   . Family history of prostate cancer   . GERD (gastroesophageal reflux disease)   . Glaucoma   . History of bilateral mastectomy 05/20/2010  . Hypertension   . Lung cancer ([place]) dx'd 12/2010   last Ct scan "no lung cancer" showing 11'16 CT Chest Epic.  . Malignant neoplasm of ascending colon ([place]) 01/29/2016  . Stroke [facility])    2 yrs ago-no residual  . Tubular adenoma of colon 07/2011   colon polyps ans reoccurence with malignancy found    ALLERGIES:  is allergic to doxycycline and sulfa antibiotics.  MEDICATIONS:  Current Outpatient Medications  Medication Sig Dispense Refill  . alendronate (FOSAMAX) 70 MG tablet Take 70 mg once a week by mouth.    Marland Kitchen amLODipine (NORVASC) 5 MG tablet Take 5 mg by mouth daily. Per Pt -she takes 2.5 mg in am and 5 mg in pm    . brimonidine-timolol (COMBIGAN) 0.2-0.5 % ophthalmic solution Place 1 drop into the right eye every 12 (twelve) hours.    . Cholecalciferol (VITAMIN D3) 2000 units capsule Take 2,000 Units daily by mouth.    . clopidogrel (PLAVIX) 75 MG tablet Take 75 mg by mouth daily.    . cycloSPORINE (RESTASIS) 0.05 % ophthalmic emulsion Place 1 drop into both eyes 2 (two) times daily.    Marland Kitchen escitalopram (LEXAPRO) 10 MG tablet Take 10 mg by mouth daily.     Marland Kitchen  famotidine (PEPCID) 40 MG tablet Take 40 mg by mouth daily.    Marland Kitchen FeFum-FePoly-FA-B Cmp-C-Biot (FOLIVANE-PLUS) CAPS TAKE ONE CAPSULE BY MOUTH EACH DAY 90 capsule 2  . letrozole (FEMARA) 2.5 MG tablet TAKE ONE TABLET EACH DAY 30 tablet 3  . lisinopril (PRINIVIL,ZESTRIL) 20 MG tablet Take 1 tablet by mouth 2 (two) times daily.    Marland Kitchen osimertinib mesylate (TAGRISSO) 80 MG tablet Take 1 tablet (80 mg total) by mouth daily. 30 tablet 0   Current Facility-Administered Medications  Medication Dose Route Frequency Provider Last Rate Last Dose  . 0.9 %  sodium chloride infusion  500 mL  Intravenous Continuous Armbruster, Carlota Raspberry, MD        SURGICAL HISTORY:  Past Surgical History:  Procedure Laterality Date  . APPENDECTOMY    . cataracts Bilateral   . COLON SURGERY  02/2016  . COLONOSCOPY    . EYE SURGERY    . LUNG SURGERY     Resection -" not done. Pt . tx with Tarceva  . MASTECTOMY Bilateral   . MEDIASTINOSCOPY N/A 10/02/2016   Procedure: MEDIASTINOSCOPY;  Surgeon: [name], MD;  Location: [facility] OR;  Service: Thoracic;  Laterality: N/A;  . RECONSTRUCTION BREAST W/ TRAM FLAP Bilateral    bilaterally  . TEE WITHOUT CARDIOVERSION N/A 06/04/2013   Procedure: TRANSESOPHAGEAL ECHOCARDIOGRAM (TEE);  Surgeon: Jolaine Artist, MD;  Location: [facility] ENDOSCOPY;  Service: Cardiovascular;  Laterality: N/A;  . TONSILLECTOMY    . TOTAL HIP ARTHROPLASTY Left 08/07/2016   Procedure: LEFT TOTAL HIP ARTHROPLASTY ANTERIOR APPROACH;  Surgeon: Gaynelle Arabian, MD;  Location: [facility] ORS;  Service: Orthopedics;  Laterality: Left;    REVIEW OF SYSTEMS:  A comprehensive review of systems was negative except for: Constitutional: positive for weight loss   PHYSICAL EXAMINATION: General appearance: alert, cooperative and no distress Head: Normocephalic, without obvious abnormality, atraumatic Neck: no adenopathy, no JVD, supple, symmetrical, trachea midline and thyroid not enlarged, symmetric, no tenderness/mass/nodules Lymph nodes: Cervical, supraclavicular, and axillary nodes normal. Resp: clear to auscultation bilaterally Back: symmetric, no curvature. ROM normal. No CVA tenderness. Cardio: regular rate and rhythm, S1, S2 normal, no murmur, click, rub or gallop GI: soft, non-tender; bowel sounds normal; no masses,  no organomegaly Extremities: extremities normal, atraumatic, no cyanosis or edema  ECOG PERFORMANCE STATUS: 1 - Symptomatic but completely ambulatory  Blood pressure (!) 159/57, pulse 66, temperature 98.1 F (36.7 C), temperature source Oral, resp. rate 16, height 5' 8"   (1.727 m), weight 135 lb 12.8 oz (61.6 kg), SpO2 100 %.  LABORATORY DATA: Lab Results  Component Value Date   WBC 4.3 03/02/2018   HGB 10.4 (L) 03/02/2018   HCT 31.8 (L) 03/02/2018   MCV 89.7 03/02/2018   PLT 259 03/02/2018      Chemistry      Component Value Date/Time   NA 139 01/23/2018 0954   NA 138 09/26/2017 0942   K 4.6 01/23/2018 0954   K 5.2 No visable hemolysis (H) 09/26/2017 0942   CL 107 01/23/2018 0954   CL 107 04/05/2013 0754   CO2 25 01/23/2018 0954   CO2 26 09/26/2017 0942   BUN 21 01/23/2018 0954   BUN 16.0 09/26/2017 0942   CREATININE 1.29 (H) 01/23/2018 0954   CREATININE 1.2 (H) 09/26/2017 0942      Component Value Date/Time   CALCIUM 9.7 01/23/2018 0954   CALCIUM 9.9 09/26/2017 0942   ALKPHOS 74 01/23/2018 0954   ALKPHOS 68 09/26/2017 0942   AST 15  01/23/2018 0954   AST 18 09/26/2017 0942   ALT 12 01/23/2018 0954   ALT 13 09/26/2017 0942   BILITOT 0.3 01/23/2018 0954   BILITOT 0.31 09/26/2017 0942       RADIOGRAPHIC STUDIES: No results found.  ASSESSMENT AND PLAN:  This is a very pleasant 77 years old white female with metastatic non-small cell lung cancer, adenocarcinoma diagnosed in March 2012 with positive EGFR mutation status post 68 months of treatment with Tarceva discontinued secondary to disease progression and development of EGFR T790M resistant mutation. The patient is currently on treatment with Tagrisso 80 mg by mouth daily status post 16 months.  She continues to tolerate her treatment with Tagrisso fairly well.  I recommended for her to continue her current treatment with no change. For the history of breast cancer, she will continue on Femara. I will see her back for follow-up visit in 6 weeks for evaluation with repeat blood work. She was advised to call immediately if she has any concerning symptoms in the interval. The patient voices understanding of current disease status and treatment options and is in agreement with the  current care plan. All questions were answered. The patient knows to call the clinic with any problems, questions or concerns. We can certainly see the patient much sooner if necessary.  Disclaimer: This note was dictated with voice recognition software. Similar sounding words can inadvertently be transcribed and may not be corrected upon review.

## 2018-04-07 ENCOUNTER — Encounter: Payer: Self-pay | Admitting: Internal Medicine

## 2018-04-07 ENCOUNTER — Inpatient Hospital Stay: Payer: Medicare Other

## 2018-04-07 ENCOUNTER — Telehealth: Payer: Self-pay

## 2018-04-07 ENCOUNTER — Inpatient Hospital Stay: Payer: Medicare Other | Attending: Internal Medicine | Admitting: Internal Medicine

## 2018-04-07 VITALS — BP 158/63 | HR 60 | Temp 97.8°F | Resp 18 | Ht 66.5 in | Wt 136.1 lb

## 2018-04-07 DIAGNOSIS — Z170 Estrogen receptor positive status [ER+]: Secondary | ICD-10-CM | POA: Diagnosis not present

## 2018-04-07 DIAGNOSIS — Z5111 Encounter for antineoplastic chemotherapy: Secondary | ICD-10-CM

## 2018-04-07 DIAGNOSIS — Z79899 Other long term (current) drug therapy: Secondary | ICD-10-CM | POA: Diagnosis not present

## 2018-04-07 DIAGNOSIS — C182 Malignant neoplasm of ascending colon: Secondary | ICD-10-CM | POA: Diagnosis not present

## 2018-04-07 DIAGNOSIS — C50911 Malignant neoplasm of unspecified site of right female breast: Secondary | ICD-10-CM | POA: Diagnosis not present

## 2018-04-07 DIAGNOSIS — Z79811 Long term (current) use of aromatase inhibitors: Secondary | ICD-10-CM | POA: Insufficient documentation

## 2018-04-07 DIAGNOSIS — Z9049 Acquired absence of other specified parts of digestive tract: Secondary | ICD-10-CM | POA: Diagnosis not present

## 2018-04-07 DIAGNOSIS — Z8042 Family history of malignant neoplasm of prostate: Secondary | ICD-10-CM | POA: Insufficient documentation

## 2018-04-07 DIAGNOSIS — Z9221 Personal history of antineoplastic chemotherapy: Secondary | ICD-10-CM | POA: Diagnosis not present

## 2018-04-07 DIAGNOSIS — Z8673 Personal history of transient ischemic attack (TIA), and cerebral infarction without residual deficits: Secondary | ICD-10-CM | POA: Diagnosis not present

## 2018-04-07 DIAGNOSIS — Z803 Family history of malignant neoplasm of breast: Secondary | ICD-10-CM | POA: Diagnosis not present

## 2018-04-07 DIAGNOSIS — C3412 Malignant neoplasm of upper lobe, left bronchus or lung: Secondary | ICD-10-CM | POA: Diagnosis present

## 2018-04-07 DIAGNOSIS — C50912 Malignant neoplasm of unspecified site of left female breast: Secondary | ICD-10-CM | POA: Diagnosis not present

## 2018-04-07 DIAGNOSIS — Z800 Family history of malignant neoplasm of digestive organs: Secondary | ICD-10-CM | POA: Insufficient documentation

## 2018-04-07 DIAGNOSIS — M1990 Unspecified osteoarthritis, unspecified site: Secondary | ICD-10-CM

## 2018-04-07 DIAGNOSIS — C3490 Malignant neoplasm of unspecified part of unspecified bronchus or lung: Secondary | ICD-10-CM

## 2018-04-07 DIAGNOSIS — Z7902 Long term (current) use of antithrombotics/antiplatelets: Secondary | ICD-10-CM | POA: Insufficient documentation

## 2018-04-07 DIAGNOSIS — C3492 Malignant neoplasm of unspecified part of left bronchus or lung: Secondary | ICD-10-CM

## 2018-04-07 DIAGNOSIS — Z9013 Acquired absence of bilateral breasts and nipples: Secondary | ICD-10-CM | POA: Insufficient documentation

## 2018-04-07 LAB — CMP (CANCER CENTER ONLY)
AST: 13 U/L (ref 5–34)
Albumin: 3.6 g/dL (ref 3.5–5.0)
Alkaline Phosphatase: 67 U/L (ref 40–150)
Anion gap: 4 (ref 3–11)
BUN: 16 mg/dL (ref 7–26)
CHLORIDE: 109 mmol/L (ref 98–109)
CO2: 25 mmol/L (ref 22–29)
CREATININE: 1.3 mg/dL — AB (ref 0.60–1.10)
Calcium: 9.9 mg/dL (ref 8.4–10.4)
GFR, EST AFRICAN AMERICAN: 44 mL/min — AB (ref >60)
GFR, EST NON AFRICAN AMERICAN: 38 mL/min — AB (ref >60)
Glucose, Bld: 105 mg/dL (ref 70–140)
POTASSIUM: 5.7 mmol/L — AB (ref 3.5–5.1)
SODIUM: 138 mmol/L (ref 136–145)
Total Bilirubin: 0.3 mg/dL (ref 0.2–1.2)
Total Protein: 6.5 g/dL (ref 6.4–8.3)

## 2018-04-07 LAB — CBC WITH DIFFERENTIAL (CANCER CENTER ONLY)
Basophils Absolute: 0 10*3/uL (ref 0.0–0.1)
Basophils Relative: 0 %
Eosinophils Absolute: 0.2 10*3/uL (ref 0.0–0.5)
Eosinophils Relative: 4 %
HEMATOCRIT: 31.7 % — AB (ref 34.8–46.6)
HEMOGLOBIN: 10.1 g/dL — AB (ref 11.6–15.9)
LYMPHS ABS: 0.9 10*3/uL (ref 0.9–3.3)
LYMPHS PCT: 21 %
MCH: 29.7 pg (ref 25.1–34.0)
MCHC: 31.9 g/dL (ref 31.5–36.0)
MCV: 93.2 fL (ref 79.5–101.0)
MONOS PCT: 15 %
Monocytes Absolute: 0.7 10*3/uL (ref 0.1–0.9)
NEUTROS ABS: 2.7 10*3/uL (ref 1.5–6.5)
NEUTROS PCT: 60 %
Platelet Count: 253 10*3/uL (ref 145–400)
RBC: 3.4 MIL/uL — AB (ref 3.70–5.45)
RDW: 14.3 % (ref 11.2–14.5)
WBC: 4.5 10*3/uL (ref 3.9–10.3)

## 2018-04-07 NOTE — Progress Notes (Signed)
[place] [phone]   Fax:([phone]  OFFICE PROGRESS NOTE  Eber Hong, MD [address] [place] [address]  DIAGNOSIS:  1) metastatic non-small cell lung cancer, adenocarcinoma diagnosed in March 2012 with positive EGFR mutation in exon 21 (L858R). 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 2) adenocarcinoma of the ascending colon (T2, N0, M0) with MSI high diagnosed in March 2017. 3) history of breast adenocarcinoma.  PRIOR THERAPY:  1) Tarceva 150 mg by mouth daily status post 68 months of treatment. 2) Status post right colectomy with lymph node dissection in May 2017.  CURRENT THERAPY: 1) Tagrisso 80 mg by mouth daily started 11/09/2016.  Status post 17 months of treatment. 2) observation for the history of colon adenocarcinoma. 3) Femara 2.5 mg by mouth daily for history of breast cancer.  INTERVAL HISTORY: Kristin [name] 77 y.o. female returns to the clinic today for follow-up visit accompanied by her daughter Kristin [name].  The patient is feeling fine today with no specific complaints except for mild fatigue.  She also has 1 or 2 episodes of diarrhea on daily basis but she does not take Imodium.  She denied having any nausea, vomiting or constipation.  She denied having any chest pain, shortness of breath, cough or hemoptysis.  She has no fever or chills.  She continues to tolerate her treatment with Tagrisso fairly well.  She is here for evaluation and repeat blood work.   MEDICAL HISTORY: Past Medical History:  Diagnosis Date  . Allergy   . Anemia   . Anemia of chronic disease 09/21/2016  . Anxiety   . Arthritis    arthritis- left hip  . Blood transfusion without reported diagnosis    transfusion- 3-4 yrs ago -found to be anemic on routine lab check  . Breast cancer ([place]) 2002   bilateral- bilateral mastectomies done.  . 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 CVA (cerebral infarction) 04/30/2013  .  Encounter for antineoplastic chemotherapy 10/28/2016  . Family history of brain cancer   . Family history of breast cancer   . Family history of colon cancer 10/06/2009  . Family history of colon cancer   . Family history of prostate cancer   . GERD (gastroesophageal reflux disease)   . Glaucoma   . History of bilateral mastectomy 05/20/2010  . Hypertension   . Lung cancer ([place]) dx'd 12/2010   last Ct scan "no lung cancer" showing 11'16 CT Chest Epic.  . Malignant neoplasm of ascending colon ([place]) 01/29/2016  . Stroke [facility])    2 yrs ago-no residual  . Tubular adenoma of colon 07/2011   colon polyps ans reoccurence with malignancy found    ALLERGIES:  is allergic to doxycycline and sulfa antibiotics.  MEDICATIONS:  Current Outpatient Medications  Medication Sig Dispense Refill  . alendronate (FOSAMAX) 70 MG tablet Take 70 mg once a week by mouth.    Marland Kitchen amLODipine (NORVASC) 5 MG tablet Take 5 mg by mouth daily. Per Pt -she takes 2.5 mg in am and 5 mg in pm    . brimonidine-timolol (COMBIGAN) 0.2-0.5 % ophthalmic solution Place 1 drop into the right eye every 12 (twelve) hours.    . Cholecalciferol (VITAMIN D3) 2000 units capsule Take 2,000 Units daily by mouth.    . clopidogrel (PLAVIX) 75 MG tablet Take 75 mg by mouth daily.    . cycloSPORINE (RESTASIS) 0.05 % ophthalmic emulsion Place 1 drop into both eyes 2 (two) times  daily.    . escitalopram (LEXAPRO) 10 MG tablet Take 10 mg by mouth daily.     . famotidine (PEPCID) 40 MG tablet Take 40 mg by mouth daily.    Marland Kitchen FeFum-FePoly-FA-B Cmp-C-Biot (FOLIVANE-PLUS) CAPS TAKE ONE CAPSULE BY MOUTH EACH DAY 90 capsule 2  . letrozole (FEMARA) 2.5 MG tablet TAKE ONE TABLET EACH DAY 30 tablet 3  . lisinopril (PRINIVIL,ZESTRIL) 20 MG tablet Take 1 tablet by mouth 2 (two) times daily.    Marland Kitchen osimertinib mesylate (TAGRISSO) 80 MG tablet Take 1 tablet (80 mg total) by mouth daily. 30 tablet 0  . methimazole (TAPAZOLE) 10 MG tablet Take 10 mg by mouth  daily.     Current Facility-Administered Medications  Medication Dose Route Frequency Provider Last Rate Last Dose  . 0.9 %  sodium chloride infusion  500 mL Intravenous Continuous Armbruster, Carlota Raspberry, MD        SURGICAL HISTORY:  Past Surgical History:  Procedure Laterality Date  . APPENDECTOMY    . cataracts Bilateral   . COLON SURGERY  02/2016  . COLONOSCOPY    . EYE SURGERY    . LUNG SURGERY     Resection -" not done. Pt . tx with Tarceva  . MASTECTOMY Bilateral   . MEDIASTINOSCOPY N/A 10/02/2016   Procedure: MEDIASTINOSCOPY;  Surgeon: [name], MD;  Location: [facility] OR;  Service: Thoracic;  Laterality: N/A;  . RECONSTRUCTION BREAST W/ TRAM FLAP Bilateral    bilaterally  . TEE WITHOUT CARDIOVERSION N/A 06/04/2013   Procedure: TRANSESOPHAGEAL ECHOCARDIOGRAM (TEE);  Surgeon: Jolaine Artist, MD;  Location: [facility] ENDOSCOPY;  Service: Cardiovascular;  Laterality: N/A;  . TONSILLECTOMY    . TOTAL HIP ARTHROPLASTY Left 08/07/2016   Procedure: LEFT TOTAL HIP ARTHROPLASTY ANTERIOR APPROACH;  Surgeon: Gaynelle Arabian, MD;  Location: [facility] ORS;  Service: Orthopedics;  Laterality: Left;    REVIEW OF SYSTEMS:  A comprehensive review of systems was negative except for: Constitutional: positive for weight loss Gastrointestinal: positive for diarrhea   PHYSICAL EXAMINATION: General appearance: alert, cooperative and no distress Head: Normocephalic, without obvious abnormality, atraumatic Neck: no adenopathy, no JVD, supple, symmetrical, trachea midline and thyroid not enlarged, symmetric, no tenderness/mass/nodules Lymph nodes: Cervical, supraclavicular, and axillary nodes normal. Resp: clear to auscultation bilaterally Back: symmetric, no curvature. ROM normal. No CVA tenderness. Cardio: regular rate and rhythm, S1, S2 normal, no murmur, click, rub or gallop GI: soft, non-tender; bowel sounds normal; no masses,  no organomegaly Extremities: extremities normal, atraumatic, no cyanosis  or edema  ECOG PERFORMANCE STATUS: 1 - Symptomatic but completely ambulatory  Blood pressure (!) 158/63, pulse 60, temperature 97.8 F (36.6 C), temperature source Oral, resp. rate 18, height 5' 6.5" (1.689 m), weight 136 lb 1.6 oz (61.7 kg), SpO2 99 %.  LABORATORY DATA: Lab Results  Component Value Date   WBC 4.5 04/07/2018   HGB 10.1 (L) 04/07/2018   HCT 31.7 (L) 04/07/2018   MCV 93.2 04/07/2018   PLT 253 04/07/2018      Chemistry      Component Value Date/Time   NA 139 03/02/2018 1319   NA 138 09/26/2017 0942   K 4.9 03/02/2018 1319   K 5.2 No visable hemolysis (H) 09/26/2017 0942   CL 108 03/02/2018 1319   CL 107 04/05/2013 0754   CO2 26 03/02/2018 1319   CO2 26 09/26/2017 0942   BUN 19 03/02/2018 1319   BUN 16.0 09/26/2017 0942   CREATININE 1.30 (H) 03/02/2018 1319   CREATININE  1.2 (H) 09/26/2017 0942      Component Value Date/Time   CALCIUM 9.6 03/02/2018 1319   CALCIUM 9.9 09/26/2017 0942   ALKPHOS 63 03/02/2018 1319   ALKPHOS 68 09/26/2017 0942   AST 12 03/02/2018 1319   AST 18 09/26/2017 0942   ALT 9 03/02/2018 1319   ALT 13 09/26/2017 0942   BILITOT 0.2 03/02/2018 1319   BILITOT 0.31 09/26/2017 0942       RADIOGRAPHIC STUDIES: No results found.  ASSESSMENT AND PLAN:  This is a very pleasant 77 years old white female with metastatic non-small cell lung cancer, adenocarcinoma diagnosed in March 2012 with positive EGFR mutation status post 68 months of treatment with Tarceva discontinued secondary to disease progression and development of EGFR T790M resistant mutation. The patient is currently on treatment with Tagrisso 80 mg by mouth daily status post 17 months.  The patient has been tolerating this treatment well with no concerning complaints. I recommended for her to continue her current treatment with Tagrisso with the same dose. For the history of breast cancer, she will continue on Femara. I will see her back for follow-up visit in 1 month for  evaluation after repeating CT scan of the chest, abdomen and pelvis for restaging of her disease. She was advised to call immediately if she has any concerning symptoms in the interval. The patient voices understanding of current disease status and treatment options and is in agreement with the current care plan. All questions were answered. The patient knows to call the clinic with any problems, questions or concerns. We can certainly see the patient much sooner if necessary.  Disclaimer: This note was dictated with voice recognition software. Similar sounding words can inadvertently be transcribed and may not be corrected upon review.

## 2018-04-07 NOTE — Telephone Encounter (Signed)
Printed avs and calender of upcoming appointment. Per 6/18 los. Gave patient contrast, instructions, and CT number also.

## 2018-05-04 ENCOUNTER — Ambulatory Visit (HOSPITAL_COMMUNITY)
Admission: RE | Admit: 2018-05-04 | Discharge: 2018-05-04 | Disposition: A | Discharge status: Home / Self Care | Payer: Medicare Other | Source: Ambulatory Visit | Attending: Internal Medicine | Admitting: Internal Medicine

## 2018-05-04 ENCOUNTER — Encounter (HOSPITAL_COMMUNITY): Payer: Self-pay

## 2018-05-04 ENCOUNTER — Inpatient Hospital Stay: Payer: Medicare Other | Attending: Internal Medicine

## 2018-05-04 DIAGNOSIS — I10 Essential (primary) hypertension: Secondary | ICD-10-CM | POA: Diagnosis not present

## 2018-05-04 DIAGNOSIS — D259 Leiomyoma of uterus, unspecified: Secondary | ICD-10-CM | POA: Diagnosis not present

## 2018-05-04 DIAGNOSIS — I700 Atherosclerosis of aorta: Secondary | ICD-10-CM | POA: Insufficient documentation

## 2018-05-04 DIAGNOSIS — Z9049 Acquired absence of other specified parts of digestive tract: Secondary | ICD-10-CM | POA: Insufficient documentation

## 2018-05-04 DIAGNOSIS — K219 Gastro-esophageal reflux disease without esophagitis: Secondary | ICD-10-CM | POA: Diagnosis not present

## 2018-05-04 DIAGNOSIS — K449 Diaphragmatic hernia without obstruction or gangrene: Secondary | ICD-10-CM | POA: Diagnosis not present

## 2018-05-04 DIAGNOSIS — K5730 Diverticulosis of large intestine without perforation or abscess without bleeding: Secondary | ICD-10-CM | POA: Diagnosis not present

## 2018-05-04 DIAGNOSIS — C3412 Malignant neoplasm of upper lobe, left bronchus or lung: Secondary | ICD-10-CM | POA: Diagnosis not present

## 2018-05-04 DIAGNOSIS — F418 Other specified anxiety disorders: Secondary | ICD-10-CM | POA: Insufficient documentation

## 2018-05-04 DIAGNOSIS — M1990 Unspecified osteoarthritis, unspecified site: Secondary | ICD-10-CM | POA: Diagnosis not present

## 2018-05-04 DIAGNOSIS — C3490 Malignant neoplasm of unspecified part of unspecified bronchus or lung: Secondary | ICD-10-CM

## 2018-05-04 DIAGNOSIS — C50919 Malignant neoplasm of unspecified site of unspecified female breast: Secondary | ICD-10-CM | POA: Diagnosis not present

## 2018-05-04 DIAGNOSIS — Z9013 Acquired absence of bilateral breasts and nipples: Secondary | ICD-10-CM | POA: Insufficient documentation

## 2018-05-04 DIAGNOSIS — Z8673 Personal history of transient ischemic attack (TIA), and cerebral infarction without residual deficits: Secondary | ICD-10-CM | POA: Insufficient documentation

## 2018-05-04 DIAGNOSIS — E042 Nontoxic multinodular goiter: Secondary | ICD-10-CM | POA: Insufficient documentation

## 2018-05-04 DIAGNOSIS — Z79811 Long term (current) use of aromatase inhibitors: Secondary | ICD-10-CM | POA: Insufficient documentation

## 2018-05-04 DIAGNOSIS — C182 Malignant neoplasm of ascending colon: Secondary | ICD-10-CM | POA: Insufficient documentation

## 2018-05-04 DIAGNOSIS — Z170 Estrogen receptor positive status [ER+]: Secondary | ICD-10-CM | POA: Insufficient documentation

## 2018-05-04 DIAGNOSIS — Z79899 Other long term (current) drug therapy: Secondary | ICD-10-CM | POA: Insufficient documentation

## 2018-05-04 LAB — CMP (CANCER CENTER ONLY)
ALT: 15 U/L (ref 0–44)
ANION GAP: 6 (ref 5–15)
AST: 17 U/L (ref 15–41)
Albumin: 3.8 g/dL (ref 3.5–5.0)
Alkaline Phosphatase: 70 U/L (ref 38–126)
BUN: 18 mg/dL (ref 8–23)
CHLORIDE: 104 mmol/L (ref 98–111)
CO2: 28 mmol/L (ref 22–32)
Calcium: 9.8 mg/dL (ref 8.9–10.3)
Creatinine: 1.27 mg/dL — ABNORMAL HIGH (ref 0.44–1.00)
GFR, EST AFRICAN AMERICAN: 45 mL/min — AB (ref >60)
GFR, EST NON AFRICAN AMERICAN: 39 mL/min — AB (ref >60)
Glucose, Bld: 94 mg/dL (ref 70–99)
Potassium: 4.6 mmol/L (ref 3.5–5.1)
SODIUM: 138 mmol/L (ref 135–145)
Total Bilirubin: 0.3 mg/dL (ref 0.3–1.2)
Total Protein: 6.8 g/dL (ref 6.5–8.1)

## 2018-05-04 LAB — CBC WITH DIFFERENTIAL (CANCER CENTER ONLY)
Basophils Absolute: 0 10*3/uL (ref 0.0–0.1)
Basophils Relative: 0 %
EOS ABS: 0.2 10*3/uL (ref 0.0–0.5)
EOS PCT: 4 %
HCT: 32.7 % — ABNORMAL LOW (ref 34.8–46.6)
Hemoglobin: 10.2 g/dL — ABNORMAL LOW (ref 11.6–15.9)
LYMPHS ABS: 1.1 10*3/uL (ref 0.9–3.3)
Lymphocytes Relative: 22 %
MCH: 29.4 pg (ref 25.1–34.0)
MCHC: 31.2 g/dL — AB (ref 31.5–36.0)
MCV: 94.2 fL (ref 79.5–101.0)
MONO ABS: 0.8 10*3/uL (ref 0.1–0.9)
MONOS PCT: 15 %
Neutro Abs: 3 10*3/uL (ref 1.5–6.5)
Neutrophils Relative %: 59 %
PLATELETS: 237 10*3/uL (ref 145–400)
RBC: 3.47 MIL/uL — ABNORMAL LOW (ref 3.70–5.45)
RDW: 14.7 % — AB (ref 11.2–14.5)
WBC: 5 10*3/uL (ref 3.9–10.3)

## 2018-05-04 MED ORDER — IOPAMIDOL (ISOVUE-300) INJECTION 61%
INTRAVENOUS | Status: AC
  Filled 2018-05-04: qty 100

## 2018-05-04 MED ORDER — IOPAMIDOL (ISOVUE-300) INJECTION 61%
100.0000 mL | Freq: Once | INTRAVENOUS | Status: AC | PRN
  Administered 2018-05-04: 80 mL via INTRAVENOUS

## 2018-05-06 ENCOUNTER — Inpatient Hospital Stay (HOSPITAL_BASED_OUTPATIENT_CLINIC_OR_DEPARTMENT_OTHER): Payer: Medicare Other | Admitting: Internal Medicine

## 2018-05-06 ENCOUNTER — Telehealth: Payer: Self-pay | Admitting: Internal Medicine

## 2018-05-06 ENCOUNTER — Encounter: Payer: Self-pay | Admitting: Internal Medicine

## 2018-05-06 VITALS — BP 130/52 | HR 57 | Temp 98.0°F | Resp 17 | Ht 66.5 in | Wt 142.1 lb

## 2018-05-06 DIAGNOSIS — I10 Essential (primary) hypertension: Secondary | ICD-10-CM | POA: Diagnosis not present

## 2018-05-06 DIAGNOSIS — C182 Malignant neoplasm of ascending colon: Secondary | ICD-10-CM | POA: Diagnosis not present

## 2018-05-06 DIAGNOSIS — Z5111 Encounter for antineoplastic chemotherapy: Secondary | ICD-10-CM

## 2018-05-06 DIAGNOSIS — C3412 Malignant neoplasm of upper lobe, left bronchus or lung: Secondary | ICD-10-CM

## 2018-05-06 NOTE — Progress Notes (Signed)
[place] [phone]   Fax:([phone]) [phone]  OFFICE PROGRESS NOTE  Eber Hong, MD [address] [place] [address]  DIAGNOSIS:  1) metastatic non-small cell lung cancer, adenocarcinoma diagnosed in March 2012 with positive EGFR mutation in exon 21 ([id number]). 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 2) adenocarcinoma of the ascending colon (T2, N0, M0) with MSI high diagnosed in March 2017. 3) history of breast adenocarcinoma.  PRIOR THERAPY:  1) Tarceva 150 mg by mouth daily status post 68 months of treatment. 2) Status post right colectomy with lymph node dissection in May 2017.  CURRENT THERAPY: 1) Tagrisso 80 mg by mouth daily started 11/09/2016.  Status post 18 months of treatment. 2) observation for the history of colon adenocarcinoma. 3) Femara 2.5 mg by mouth daily for history of breast cancer.  INTERVAL HISTORY: Kristin [name] 77 y.o. female returns to the clinic today for follow-up visit accompanied by her daughter [name].  The patient is doing fine today with no concerning complaints.  She enjoyed a week at the beach recently.  She denied having any chest pain, shortness breath, cough or hemoptysis.  She denied having any weight loss or night sweats.  She has no nausea, vomiting, diarrhea or constipation.  She denied having any headache or visual changes.  She continues to tolerate her treatment with Tagrisso fairly well.  The patient had a repeat CT scan of the chest, abdomen and pelvis performed recently and she is here for evaluation and discussion of her risk her results.   MEDICAL HISTORY: Past Medical History:  Diagnosis Date  . Allergy   . Anemia   . Anemia of chronic disease 09/21/2016  . Anxiety   . Arthritis    arthritis- left hip  . Blood transfusion without reported diagnosis    transfusion- 3-4 yrs ago -found to be anemic on routine lab check  . Breast cancer ([place]) 2002   bilateral-  bilateral mastectomies done.  . 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 CVA (cerebral infarction) 04/30/2013  . Encounter for antineoplastic chemotherapy 10/28/2016  . Family history of brain cancer   . Family history of breast cancer   . Family history of colon cancer 10/06/2009  . Family history of colon cancer   . Family history of prostate cancer   . GERD (gastroesophageal reflux disease)   . Glaucoma   . History of bilateral mastectomy 05/20/2010  . Hypertension   . Lung cancer ([place]) dx'd 12/2010   last Ct scan "no lung cancer" showing 11'16 CT Chest Epic.  . Malignant neoplasm of ascending colon ([place]) 01/29/2016  . Stroke [facility])    2 yrs ago-no residual  . Tubular adenoma of colon 07/2011   colon polyps ans reoccurence with malignancy found    ALLERGIES:  is allergic to doxycycline and sulfa antibiotics.  MEDICATIONS:  Current Outpatient Medications  Medication Sig Dispense Refill  . alendronate (FOSAMAX) 70 MG tablet Take 70 mg once a week by mouth.    Marland Kitchen amLODipine (NORVASC) 5 MG tablet Take 5 mg by mouth daily. Per Pt -she takes 2.5 mg in am and 5 mg in pm    . brimonidine-timolol (COMBIGAN) 0.2-0.5 % ophthalmic solution Place 1 drop into the right eye every 12 (twelve) hours.    . Cholecalciferol (VITAMIN D3) 2000 units capsule Take 2,000 Units daily by mouth.    . clopidogrel (PLAVIX) 75 MG tablet Take 75 mg by mouth daily.    Marland Kitchen  cycloSPORINE (RESTASIS) 0.05 % ophthalmic emulsion Place 1 drop into both eyes 2 (two) times daily.    Marland Kitchen escitalopram (LEXAPRO) 10 MG tablet Take 10 mg by mouth daily.     . famotidine (PEPCID) 40 MG tablet Take 40 mg by mouth daily.    Marland Kitchen FeFum-FePoly-FA-B Cmp-C-Biot (FOLIVANE-PLUS) CAPS TAKE ONE CAPSULE BY MOUTH EACH DAY 90 capsule 2  . letrozole (FEMARA) 2.5 MG tablet TAKE ONE TABLET EACH DAY 30 tablet 3  . lisinopril (PRINIVIL,ZESTRIL) 20 MG tablet Take 1 tablet by mouth 2 (two) times daily.    . methimazole (TAPAZOLE) 10 MG tablet Take 10 mg by mouth  daily.    Marland Kitchen osimertinib mesylate (TAGRISSO) 80 MG tablet Take 1 tablet (80 mg total) by mouth daily. 30 tablet 0   Current Facility-Administered Medications  Medication Dose Route Frequency Provider Last Rate Last Dose  . 0.9 %  sodium chloride infusion  500 mL Intravenous Continuous Armbruster, Carlota Raspberry, MD        SURGICAL HISTORY:  Past Surgical History:  Procedure Laterality Date  . APPENDECTOMY    . cataracts Bilateral   . COLON SURGERY  02/2016  . COLONOSCOPY    . EYE SURGERY    . LUNG SURGERY     Resection -" not done. Pt . tx with Tarceva  . MASTECTOMY Bilateral   . MEDIASTINOSCOPY N/A 10/02/2016   Procedure: MEDIASTINOSCOPY;  Surgeon: [name], MD;  Location[facility];  Service: Thoracic;  Laterality: N/A;  . RECONSTRUCTION BREAST W/ TRAM FLAP Bilateral    bilaterally  . TEE WITHOUT CARDIOVERSION N/A 06/04/2013   Procedure: TRANSESOPHAGEAL ECHOCARDIOGRAM (TEE);  Surgeon: Jolaine Artist, MD;  Location: [facility] ENDOSCOPY;  Service: Cardiovascular;  Laterality: N/A;  . TONSILLECTOMY    . TOTAL HIP ARTHROPLASTY Left 08/07/2016   Procedure: LEFT TOTAL HIP ARTHROPLASTY ANTERIOR APPROACH;  Surgeon: Gaynelle Arabian, MD;  Location: [facility] ORS;  Service: Orthopedics;  Laterality: Left;    REVIEW OF SYSTEMS:  Constitutional: negative Eyes: negative Ears, nose, mouth, throat, and face: negative Respiratory: negative Cardiovascular: negative Gastrointestinal: negative Genitourinary:negative Integument/breast: negative Hematologic/lymphatic: negative Musculoskeletal:negative Neurological: negative Behavioral/Psych: negative Endocrine: negative Allergic/Immunologic: negative   PHYSICAL EXAMINATION: General appearance: alert, cooperative and no distress Head: Normocephalic, without obvious abnormality, atraumatic Neck: no adenopathy, no JVD, supple, symmetrical, trachea midline and thyroid not enlarged, symmetric, no tenderness/mass/nodules Lymph nodes: Cervical,  supraclavicular, and axillary nodes normal. Resp: clear to auscultation bilaterally Back: symmetric, no curvature. ROM normal. No CVA tenderness. Cardio: regular rate and rhythm, S1, S2 normal, no murmur, click, rub or gallop GI: soft, non-tender; bowel sounds normal; no masses,  no organomegaly Extremities: extremities normal, atraumatic, no cyanosis or edema Neurologic: Alert and oriented X 3, normal strength and tone. Normal symmetric reflexes. Normal coordination and gait  ECOG PERFORMANCE STATUS: 1 - Symptomatic but completely ambulatory  Blood pressure (!) 130/52, pulse (!) 57, temperature 98 F (36.7 C), temperature source Oral, resp. rate 17, height 5' 6.5" (1.689 m), weight 142 lb 1.6 oz (64.5 kg), SpO2 100 %.  LABORATORY DATA: Lab Results  Component Value Date   WBC 5.0 05/04/2018   HGB 10.2 (L) 05/04/2018   HCT 32.7 (L) 05/04/2018   MCV 94.2 05/04/2018   PLT 237 05/04/2018      Chemistry      Component Value Date/Time   NA 138 05/04/2018 1121   NA 138 09/26/2017 0942   K 4.6 05/04/2018 1121   K 5.2 No visable hemolysis (H) 09/26/2017 1610  CL 104 05/04/2018 1121   CL 107 04/05/2013 0754   CO2 28 05/04/2018 1121   CO2 26 09/26/2017 0942   BUN 18 05/04/2018 1121   BUN 16.0 09/26/2017 0942   CREATININE 1.27 (H) 05/04/2018 1121   CREATININE 1.2 (H) 09/26/2017 0942      Component Value Date/Time   CALCIUM 9.8 05/04/2018 1121   CALCIUM 9.9 09/26/2017 0942   ALKPHOS 70 05/04/2018 1121   ALKPHOS 68 09/26/2017 0942   AST 17 05/04/2018 1121   AST 18 09/26/2017 0942   ALT 15 05/04/2018 1121   ALT 13 09/26/2017 0942   BILITOT 0.3 05/04/2018 1121   BILITOT 0.31 09/26/2017 0942       RADIOGRAPHIC STUDIES: Ct Chest W Contrast  Result Date: 05/04/2018 CLINICAL DATA:  Left upper lobe lung adenocarcinoma diagnosed 2012 status post wedge resection with recurrence in 2017. Ascending colon cancer diagnosed 2017 status post right colectomy. History of breast cancer.  Ongoing oral chemotherapy. Restaging. EXAM: CT CHEST, ABDOMEN, AND PELVIS WITH CONTRAST TECHNIQUE: Multidetector CT imaging of the chest, abdomen and pelvis was performed following the standard protocol during bolus administration of intravenous contrast. CONTRAST:  45m ISOVUE-300 IOPAMIDOL (ISOVUE-300) INJECTION 61% COMPARISON:  01/23/2018 CT chest, abdomen and pelvis. FINDINGS: CT CHEST FINDINGS Cardiovascular: Normal heart size. No significant pericardial effusion/thickening. Left anterior descending coronary atherosclerosis. Atherosclerotic nonaneurysmal thoracic aorta. Normal caliber pulmonary arteries. No central pulmonary emboli. Mediastinum/Nodes: Stable mild multinodular goiter with dominant heterogeneous hypodense 1.5 cm anterior left thyroid lobe nodule. Unremarkable esophagus. No pathologically enlarged axillary, mediastinal or hilar lymph nodes. Surgical clips are again noted in the right axilla. Lungs/Pleura: No pneumothorax. No pleural effusion. Stable postsurgical changes from wedge resection in the posterior left upper lobe. Mild compressive atelectasis in the medial lower lobes bilaterally due to the hiatal hernia. No acute consolidative airspace disease, lung masses or significant pulmonary nodules. Musculoskeletal: No aggressive appearing focal osseous lesions. Moderate thoracic spondylosis. CT ABDOMEN PELVIS FINDINGS Hepatobiliary: Normal size liver. Simple 2.8 cm superior right liver cyst. No new liver lesions. Normal gallbladder with no radiopaque cholelithiasis. No biliary ductal dilatation. Pancreas: Normal, with no mass or duct dilation. Spleen: Normal size. No mass. Adrenals/Urinary Tract: Normal adrenals. No hydronephrosis. Subcentimeter hypodense renal cortical lesions in both kidneys are too small to characterize and are unchanged probably benign. No new renal lesions. Bladder obscured by streak artifact from the left hip hardware, with no gross bladder abnormality. Stomach/Bowel:  Stable large hiatal hernia. Otherwise normal nondistended stomach. Stable postsurgical changes from right hemicolectomy with intact appearing ileocolic anastomosis in the right abdomen. No dilated small bowel loops or small bowel wall thickening. Oral contrast progresses to the rectum. Mild sigmoid diverticulosis. No wall thickening or acute pericolonic fat stranding in the remnant large-bowel. Vascular/Lymphatic: Atherosclerotic nonaneurysmal abdominal aorta. Patent portal, splenic, hepatic and renal veins. No pathologically enlarged lymph nodes in the abdomen or pelvis, noting portions of left pelvis are obscured by metallic streak artifact from the left hip hardware. Reproductive: Stable mildly enlarged myomatous uterus with numerous coarsely calcified degenerated small uterine fibroids. No adnexal masses. Other: No pneumoperitoneum, ascites or focal fluid collection. Musculoskeletal: No aggressive appearing focal osseous lesions. Moderate lumbar spondylosis. Left total hip arthroplasty. IMPRESSION: 1. No evidence of recurrent metastatic disease in the chest, abdomen or pelvis. 2. Chronic findings include: Aortic Atherosclerosis (ICD10-I70.0). Stable mild multinodular goiter. Large hiatal hernia. Mild sigmoid diverticulosis. Myomatous uterus. Electronically Signed   By: JIlona SorrelM.D.   On: 05/04/2018 14:28   Ct  Abdomen Pelvis W Contrast  Result Date: 05/04/2018 CLINICAL DATA:  Left upper lobe lung adenocarcinoma diagnosed 2012 status post wedge resection with recurrence in 2017. Ascending colon cancer diagnosed 2017 status post right colectomy. History of breast cancer. Ongoing oral chemotherapy. Restaging. EXAM: CT CHEST, ABDOMEN, AND PELVIS WITH CONTRAST TECHNIQUE: Multidetector CT imaging of the chest, abdomen and pelvis was performed following the standard protocol during bolus administration of intravenous contrast. CONTRAST:  34m ISOVUE-300 IOPAMIDOL (ISOVUE-300) INJECTION 61% COMPARISON:   01/23/2018 CT chest, abdomen and pelvis. FINDINGS: CT CHEST FINDINGS Cardiovascular: Normal heart size. No significant pericardial effusion/thickening. Left anterior descending coronary atherosclerosis. Atherosclerotic nonaneurysmal thoracic aorta. Normal caliber pulmonary arteries. No central pulmonary emboli. Mediastinum/Nodes: Stable mild multinodular goiter with dominant heterogeneous hypodense 1.5 cm anterior left thyroid lobe nodule. Unremarkable esophagus. No pathologically enlarged axillary, mediastinal or hilar lymph nodes. Surgical clips are again noted in the right axilla. Lungs/Pleura: No pneumothorax. No pleural effusion. Stable postsurgical changes from wedge resection in the posterior left upper lobe. Mild compressive atelectasis in the medial lower lobes bilaterally due to the hiatal hernia. No acute consolidative airspace disease, lung masses or significant pulmonary nodules. Musculoskeletal: No aggressive appearing focal osseous lesions. Moderate thoracic spondylosis. CT ABDOMEN PELVIS FINDINGS Hepatobiliary: Normal size liver. Simple 2.8 cm superior right liver cyst. No new liver lesions. Normal gallbladder with no radiopaque cholelithiasis. No biliary ductal dilatation. Pancreas: Normal, with no mass or duct dilation. Spleen: Normal size. No mass. Adrenals/Urinary Tract: Normal adrenals. No hydronephrosis. Subcentimeter hypodense renal cortical lesions in both kidneys are too small to characterize and are unchanged probably benign. No new renal lesions. Bladder obscured by streak artifact from the left hip hardware, with no gross bladder abnormality. Stomach/Bowel: Stable large hiatal hernia. Otherwise normal nondistended stomach. Stable postsurgical changes from right hemicolectomy with intact appearing ileocolic anastomosis in the right abdomen. No dilated small bowel loops or small bowel wall thickening. Oral contrast progresses to the rectum. Mild sigmoid diverticulosis. No wall thickening or  acute pericolonic fat stranding in the remnant large-bowel. Vascular/Lymphatic: Atherosclerotic nonaneurysmal abdominal aorta. Patent portal, splenic, hepatic and renal veins. No pathologically enlarged lymph nodes in the abdomen or pelvis, noting portions of left pelvis are obscured by metallic streak artifact from the left hip hardware. Reproductive: Stable mildly enlarged myomatous uterus with numerous coarsely calcified degenerated small uterine fibroids. No adnexal masses. Other: No pneumoperitoneum, ascites or focal fluid collection. Musculoskeletal: No aggressive appearing focal osseous lesions. Moderate lumbar spondylosis. Left total hip arthroplasty. IMPRESSION: 1. No evidence of recurrent metastatic disease in the chest, abdomen or pelvis. 2. Chronic findings include: Aortic Atherosclerosis (ICD10-I70.0). Stable mild multinodular goiter. Large hiatal hernia. Mild sigmoid diverticulosis. Myomatous uterus. Electronically Signed   By: JIlona SorrelM.D.   On: 05/04/2018 14:28    ASSESSMENT AND PLAN:  This is a very pleasant 77years old white female with metastatic non-small cell lung cancer, adenocarcinoma diagnosed in March 2012 with positive EGFR mutation status post 68 months of treatment with Tarceva discontinued secondary to disease progression and development of EGFR T790M resistant mutation. The patient is currently on treatment with Tagrisso 80 mg by mouth daily status post 18 months.  She has been tolerating her treatment with Tagrisso fairly well. She had a repeat CT scan of the chest, abdomen and pelvis performed recently.  I personally and independently reviewed the scan images and discussed the results with the patient and her daughter today. Her scan showed no concerning findings for disease progression. I recommended for the  patient to continue her current treatment with Tagrisso 80 mg p.o. daily. For the history of breast cancer, she will continue on Femara. I will see her back for  follow-up visit in 6 weeks for evaluation and repeat blood work. The patient was advised to call immediately if she has any concerning symptoms in the interval. The patient voices understanding of current disease status and treatment options and is in agreement with the current care plan. All questions were answered. The patient knows to call the clinic with any problems, questions or concerns. We can certainly see the patient much sooner if necessary.  Disclaimer: This note was dictated with voice recognition software. Similar sounding words can inadvertently be transcribed and may not be corrected upon review.

## 2018-05-06 NOTE — Telephone Encounter (Signed)
Gave patient avs and calendar of upcoming aug appts.  °

## 2018-06-09 ENCOUNTER — Telehealth: Payer: Self-pay | Admitting: Emergency Medicine

## 2018-06-09 NOTE — Telephone Encounter (Signed)
Pt daughter [name] called stating pt has had N/V/D x3 days. Pt went to her local ER last night and received IV fluids for dehydration then was released. Pt is wondering should her mother be taking her medications as she did not take them yesterday. Per Erasmo Downer, NP, pt should hold medications until able to tolerate food and liquids. Pt daughter states she had soup this morning and her tagrisso w/ no issues of N/V/D. Encouraged her to drink fluids and call her PCP if this continues . Pt daughter verbalized understanding.

## 2018-06-11 ENCOUNTER — Other Ambulatory Visit: Payer: Self-pay

## 2018-06-11 ENCOUNTER — Encounter (HOSPITAL_COMMUNITY): Payer: Self-pay

## 2018-06-11 ENCOUNTER — Other Ambulatory Visit (HOSPITAL_COMMUNITY): Payer: Self-pay | Admitting: Internal Medicine

## 2018-06-11 ENCOUNTER — Inpatient Hospital Stay (HOSPITAL_COMMUNITY)
Admission: EM | Admit: 2018-06-11 | Discharge: 2018-06-25 | DRG: 330 | Disposition: A | Discharge status: Home Health | Payer: Medicare Other | Attending: Family Medicine | Admitting: Family Medicine

## 2018-06-11 ENCOUNTER — Emergency Department (HOSPITAL_COMMUNITY): Payer: Medicare Other

## 2018-06-11 DIAGNOSIS — E876 Hypokalemia: Secondary | ICD-10-CM | POA: Diagnosis not present

## 2018-06-11 DIAGNOSIS — Z85038 Personal history of other malignant neoplasm of large intestine: Secondary | ICD-10-CM

## 2018-06-11 DIAGNOSIS — C3410 Malignant neoplasm of upper lobe, unspecified bronchus or lung: Secondary | ICD-10-CM | POA: Diagnosis present

## 2018-06-11 DIAGNOSIS — Z853 Personal history of malignant neoplasm of breast: Secondary | ICD-10-CM | POA: Diagnosis not present

## 2018-06-11 DIAGNOSIS — Z96642 Presence of left artificial hip joint: Secondary | ICD-10-CM | POA: Diagnosis present

## 2018-06-11 DIAGNOSIS — Z9221 Personal history of antineoplastic chemotherapy: Secondary | ICD-10-CM

## 2018-06-11 DIAGNOSIS — N390 Urinary tract infection, site not specified: Secondary | ICD-10-CM | POA: Diagnosis present

## 2018-06-11 DIAGNOSIS — K449 Diaphragmatic hernia without obstruction or gangrene: Secondary | ICD-10-CM | POA: Diagnosis present

## 2018-06-11 DIAGNOSIS — J9811 Atelectasis: Secondary | ICD-10-CM | POA: Diagnosis not present

## 2018-06-11 DIAGNOSIS — Z881 Allergy status to other antibiotic agents status: Secondary | ICD-10-CM | POA: Diagnosis not present

## 2018-06-11 DIAGNOSIS — K660 Peritoneal adhesions (postprocedural) (postinfection): Secondary | ICD-10-CM | POA: Diagnosis present

## 2018-06-11 DIAGNOSIS — Z7902 Long term (current) use of antithrombotics/antiplatelets: Secondary | ICD-10-CM

## 2018-06-11 DIAGNOSIS — Z8673 Personal history of transient ischemic attack (TIA), and cerebral infarction without residual deficits: Secondary | ICD-10-CM

## 2018-06-11 DIAGNOSIS — Z882 Allergy status to sulfonamides status: Secondary | ICD-10-CM | POA: Diagnosis not present

## 2018-06-11 DIAGNOSIS — N183 Chronic kidney disease, stage 3 unspecified: Secondary | ICD-10-CM | POA: Diagnosis present

## 2018-06-11 DIAGNOSIS — K5732 Diverticulitis of large intestine without perforation or abscess without bleeding: Secondary | ICD-10-CM | POA: Diagnosis present

## 2018-06-11 DIAGNOSIS — E0500 Thyrotoxicosis with diffuse goiter without thyrotoxic crisis or storm: Secondary | ICD-10-CM

## 2018-06-11 DIAGNOSIS — Z79899 Other long term (current) drug therapy: Secondary | ICD-10-CM | POA: Diagnosis not present

## 2018-06-11 DIAGNOSIS — C3412 Malignant neoplasm of upper lobe, left bronchus or lung: Secondary | ICD-10-CM | POA: Diagnosis present

## 2018-06-11 DIAGNOSIS — D638 Anemia in other chronic diseases classified elsewhere: Secondary | ICD-10-CM | POA: Diagnosis present

## 2018-06-11 DIAGNOSIS — K56609 Unspecified intestinal obstruction, unspecified as to partial versus complete obstruction: Secondary | ICD-10-CM | POA: Diagnosis not present

## 2018-06-11 DIAGNOSIS — I10 Essential (primary) hypertension: Secondary | ICD-10-CM | POA: Diagnosis not present

## 2018-06-11 DIAGNOSIS — K56601 Complete intestinal obstruction, unspecified as to cause: Secondary | ICD-10-CM | POA: Diagnosis not present

## 2018-06-11 DIAGNOSIS — E0590 Thyrotoxicosis, unspecified without thyrotoxic crisis or storm: Secondary | ICD-10-CM

## 2018-06-11 DIAGNOSIS — F419 Anxiety disorder, unspecified: Secondary | ICD-10-CM | POA: Diagnosis present

## 2018-06-11 DIAGNOSIS — E440 Moderate protein-calorie malnutrition: Secondary | ICD-10-CM | POA: Diagnosis present

## 2018-06-11 DIAGNOSIS — M810 Age-related osteoporosis without current pathological fracture: Secondary | ICD-10-CM | POA: Diagnosis present

## 2018-06-11 DIAGNOSIS — Z9013 Acquired absence of bilateral breasts and nipples: Secondary | ICD-10-CM

## 2018-06-11 DIAGNOSIS — C182 Malignant neoplasm of ascending colon: Secondary | ICD-10-CM | POA: Diagnosis present

## 2018-06-11 DIAGNOSIS — R1030 Lower abdominal pain, unspecified: Secondary | ICD-10-CM

## 2018-06-11 DIAGNOSIS — R059 Cough, unspecified: Secondary | ICD-10-CM

## 2018-06-11 DIAGNOSIS — I129 Hypertensive chronic kidney disease with stage 1 through stage 4 chronic kidney disease, or unspecified chronic kidney disease: Secondary | ICD-10-CM | POA: Diagnosis present

## 2018-06-11 DIAGNOSIS — Z7983 Long term (current) use of bisphosphonates: Secondary | ICD-10-CM | POA: Diagnosis not present

## 2018-06-11 DIAGNOSIS — K644 Residual hemorrhoidal skin tags: Secondary | ICD-10-CM | POA: Diagnosis present

## 2018-06-11 DIAGNOSIS — R05 Cough: Secondary | ICD-10-CM

## 2018-06-11 DIAGNOSIS — K633 Ulcer of intestine: Secondary | ICD-10-CM | POA: Diagnosis present

## 2018-06-11 DIAGNOSIS — K5720 Diverticulitis of large intestine with perforation and abscess without bleeding: Secondary | ICD-10-CM | POA: Diagnosis present

## 2018-06-11 DIAGNOSIS — Z4659 Encounter for fitting and adjustment of other gastrointestinal appliance and device: Secondary | ICD-10-CM

## 2018-06-11 DIAGNOSIS — K6389 Other specified diseases of intestine: Secondary | ICD-10-CM | POA: Diagnosis not present

## 2018-06-11 DIAGNOSIS — K56699 Other intestinal obstruction unspecified as to partial versus complete obstruction: Secondary | ICD-10-CM | POA: Diagnosis present

## 2018-06-11 DIAGNOSIS — F329 Major depressive disorder, single episode, unspecified: Secondary | ICD-10-CM | POA: Diagnosis present

## 2018-06-11 DIAGNOSIS — R935 Abnormal findings on diagnostic imaging of other abdominal regions, including retroperitoneum: Secondary | ICD-10-CM | POA: Diagnosis not present

## 2018-06-11 LAB — CBC
HEMATOCRIT: 37.3 % (ref 36.0–46.0)
HEMOGLOBIN: 11.9 g/dL — AB (ref 12.0–15.0)
MCH: 29.3 pg (ref 26.0–34.0)
MCHC: 31.9 g/dL (ref 30.0–36.0)
MCV: 91.9 fL (ref 78.0–100.0)
Platelets: 522 10*3/uL — ABNORMAL HIGH (ref 150–400)
RBC: 4.06 MIL/uL (ref 3.87–5.11)
RDW: 13.3 % (ref 11.5–15.5)
WBC: 8.9 10*3/uL (ref 4.0–10.5)

## 2018-06-11 LAB — COMPREHENSIVE METABOLIC PANEL
ALBUMIN: 3.2 g/dL — AB (ref 3.5–5.0)
ALT: 11 U/L (ref 0–44)
ANION GAP: 7 (ref 5–15)
AST: 14 U/L — ABNORMAL LOW (ref 15–41)
Alkaline Phosphatase: 77 U/L (ref 38–126)
BILIRUBIN TOTAL: 0.3 mg/dL (ref 0.3–1.2)
BUN: 27 mg/dL — ABNORMAL HIGH (ref 8–23)
CO2: 23 mmol/L (ref 22–32)
Calcium: 9.5 mg/dL (ref 8.9–10.3)
Chloride: 108 mmol/L (ref 98–111)
Creatinine, Ser: 1.51 mg/dL — ABNORMAL HIGH (ref 0.44–1.00)
GFR calc Af Amer: 37 mL/min — ABNORMAL LOW (ref >60)
GFR calc non Af Amer: 32 mL/min — ABNORMAL LOW (ref >60)
GLUCOSE: 146 mg/dL — AB (ref 70–99)
POTASSIUM: 4.6 mmol/L (ref 3.5–5.1)
Sodium: 138 mmol/L (ref 135–145)
TOTAL PROTEIN: 7 g/dL (ref 6.5–8.1)

## 2018-06-11 LAB — URINALYSIS, ROUTINE W REFLEX MICROSCOPIC
Bilirubin Urine: NEGATIVE
GLUCOSE, UA: NEGATIVE mg/dL
Hgb urine dipstick: NEGATIVE
KETONES UR: NEGATIVE mg/dL
Nitrite: NEGATIVE
PROTEIN: NEGATIVE mg/dL
Specific Gravity, Urine: 1.024 (ref 1.005–1.030)
pH: 5 (ref 5.0–8.0)

## 2018-06-11 LAB — DIFFERENTIAL
BASOS ABS: 0 10*3/uL (ref 0.0–0.1)
Basophils Relative: 0 %
EOS ABS: 0 10*3/uL (ref 0.0–0.7)
Eosinophils Relative: 0 %
LYMPHS ABS: 0.7 10*3/uL (ref 0.7–4.0)
LYMPHS PCT: 8 %
MONOS PCT: 15 %
Monocytes Absolute: 1.3 10*3/uL — ABNORMAL HIGH (ref 0.1–1.0)
NEUTROS ABS: 6.8 10*3/uL (ref 1.7–7.7)
Neutrophils Relative %: 77 %

## 2018-06-11 LAB — LIPASE, BLOOD: Lipase: 55 U/L — ABNORMAL HIGH (ref 11–51)

## 2018-06-11 MED ORDER — CYCLOSPORINE 0.05 % OP EMUL
1.0000 [drp] | Freq: Two times a day (BID) | OPHTHALMIC | Status: DC
  Administered 2018-06-11 – 2018-06-25 (×27): 1 [drp] via OPHTHALMIC
  Filled 2018-06-11 (×28): qty 1

## 2018-06-11 MED ORDER — SODIUM CHLORIDE 0.9 % IV SOLN
1.0000 g | Freq: Every day | INTRAVENOUS | Status: DC
  Administered 2018-06-11 – 2018-06-14 (×4): 1 g via INTRAVENOUS
  Filled 2018-06-11: qty 10
  Filled 2018-06-11: qty 1
  Filled 2018-06-11: qty 10
  Filled 2018-06-11: qty 1

## 2018-06-11 MED ORDER — ONDANSETRON HCL 4 MG/2ML IJ SOLN
4.0000 mg | Freq: Once | INTRAMUSCULAR | Status: AC
  Administered 2018-06-11: 4 mg via INTRAVENOUS
  Filled 2018-06-11: qty 2

## 2018-06-11 MED ORDER — PROMETHAZINE HCL 25 MG PO TABS: 12.5000 mg | ORAL_TABLET | Freq: Four times a day (QID) | ORAL | Status: DC | PRN

## 2018-06-11 MED ORDER — BRIMONIDINE TARTRATE-TIMOLOL 0.2-0.5 % OP SOLN: 1.0000 [drp] | Freq: Two times a day (BID) | OPHTHALMIC | Status: DC

## 2018-06-11 MED ORDER — FAMOTIDINE IN NACL 20-0.9 MG/50ML-% IV SOLN
20.0000 mg | Freq: Two times a day (BID) | INTRAVENOUS | Status: DC
  Administered 2018-06-11 – 2018-06-15 (×9): 20 mg via INTRAVENOUS
  Filled 2018-06-11 (×9): qty 50

## 2018-06-11 MED ORDER — ONDANSETRON HCL 4 MG/2ML IJ SOLN
4.0000 mg | Freq: Four times a day (QID) | INTRAMUSCULAR | Status: DC | PRN
  Administered 2018-06-12 – 2018-06-25 (×14): 4 mg via INTRAVENOUS
  Filled 2018-06-11 (×14): qty 2

## 2018-06-11 MED ORDER — SODIUM CHLORIDE 0.9 % IV BOLUS
1000.0000 mL | Freq: Once | INTRAVENOUS | Status: AC
  Administered 2018-06-11: 1000 mL via INTRAVENOUS

## 2018-06-11 MED ORDER — ACETAMINOPHEN 650 MG RE SUPP: 650.0000 mg | Freq: Four times a day (QID) | RECTAL | Status: DC | PRN

## 2018-06-11 MED ORDER — BRIMONIDINE TARTRATE 0.2 % OP SOLN
1.0000 [drp] | Freq: Two times a day (BID) | OPHTHALMIC | Status: DC
  Administered 2018-06-12 – 2018-06-25 (×24): 1 [drp] via OPHTHALMIC
  Filled 2018-06-11: qty 5

## 2018-06-11 MED ORDER — SODIUM CHLORIDE 0.9 % IV SOLN
INTRAVENOUS | Status: AC
  Administered 2018-06-11: 23:00:00 via INTRAVENOUS

## 2018-06-11 MED ORDER — IOPAMIDOL (ISOVUE-300) INJECTION 61%
INTRAVENOUS | Status: AC
  Filled 2018-06-11: qty 100

## 2018-06-11 MED ORDER — HEPARIN SODIUM (PORCINE) 5000 UNIT/ML IJ SOLN
5000.0000 [IU] | Freq: Three times a day (TID) | INTRAMUSCULAR | Status: DC
  Administered 2018-06-11 – 2018-06-25 (×38): 5000 [IU] via SUBCUTANEOUS
  Filled 2018-06-11 (×40): qty 1

## 2018-06-11 MED ORDER — IOPAMIDOL (ISOVUE-300) INJECTION 61%
80.0000 mL | Freq: Once | INTRAVENOUS | Status: AC | PRN
  Administered 2018-06-11: 80 mL via INTRAVENOUS

## 2018-06-11 MED ORDER — HYDRALAZINE HCL 20 MG/ML IJ SOLN: 10.0000 mg | INTRAMUSCULAR | Status: DC | PRN

## 2018-06-11 MED ORDER — HYDROMORPHONE HCL 1 MG/ML IJ SOLN
0.5000 mg | Freq: Once | INTRAMUSCULAR | Status: AC
  Administered 2018-06-11: 0.5 mg via INTRAVENOUS
  Filled 2018-06-11: qty 1

## 2018-06-11 MED ORDER — TIMOLOL MALEATE 0.5 % OP SOLN
1.0000 [drp] | Freq: Two times a day (BID) | OPHTHALMIC | Status: DC
  Administered 2018-06-12 – 2018-06-25 (×25): 1 [drp] via OPHTHALMIC
  Filled 2018-06-11: qty 5

## 2018-06-11 MED ORDER — FENTANYL CITRATE (PF) 100 MCG/2ML IJ SOLN
25.0000 ug | INTRAMUSCULAR | Status: DC | PRN
  Administered 2018-06-11 – 2018-06-13 (×9): 25 ug via INTRAVENOUS
  Administered 2018-06-13: 50 ug via INTRAVENOUS
  Administered 2018-06-13 – 2018-06-16 (×8): 25 ug via INTRAVENOUS
  Administered 2018-06-16: 50 ug via INTRAVENOUS
  Administered 2018-06-16: 25 ug via INTRAVENOUS
  Administered 2018-06-16 – 2018-06-17 (×4): 50 ug via INTRAVENOUS
  Administered 2018-06-18 – 2018-06-19 (×2): 25 ug via INTRAVENOUS
  Administered 2018-06-19 – 2018-06-23 (×6): 50 ug via INTRAVENOUS
  Filled 2018-06-11 (×32): qty 2

## 2018-06-11 MED ORDER — ACETAMINOPHEN 325 MG PO TABS
650.0000 mg | ORAL_TABLET | Freq: Four times a day (QID) | ORAL | Status: DC | PRN
  Administered 2018-06-20: 325 mg via ORAL
  Administered 2018-06-21 – 2018-06-25 (×12): 650 mg via ORAL
  Filled 2018-06-11 (×14): qty 2

## 2018-06-11 NOTE — ED Triage Notes (Signed)
She was seen by her doctor recently and dx with u.t.i. She is here today with persistent low abd. Pressure and "I don't feel like I'm emptying my bladder completely". She is in no distress.

## 2018-06-11 NOTE — Progress Notes (Signed)
ED TO INPATIENT HANDOFF REPORT  Name/Age/Gender Kristin [name] 77 y.o. female  Code Status    Code Status Orders  (From admission, onward)         Start     Ordered   06/11/18 2202  Full code  Continuous     06/11/18 2202        Code Status History    Date Active Date Inactive Code Status Order ID Comments User Context   08/07/2016 1315 08/08/2016 1416 Full Code [id number]  Gaynelle Arabian, MD Inpatient   04/30/2013 0039 04/30/2013 1916 Full Code [id number]  Delfina Redwood, MD Inpatient      Home/SNF/Other Home  Chief Complaint Abdominal Pain  Level of Care/Admitting Diagnosis ED Disposition    ED Disposition Condition [place]: [facility] [[id number]]  Level of Care: Med-Surg [16]  Diagnosis: Large bowel obstruction [facility]) [[id number]]  Admitting Physician: Vianne Bulls [[id number]]  Attending Physician: Vianne Bulls [[id number]]  Estimated length of stay: past midnight tomorrow  Certification:: I certify this patient will need inpatient services for at least 2 midnights  PT Class (Do Not Modify): Inpatient [101]  PT Acc Code (Do Not Modify): Private [1]       Medical History Past Medical History:  Diagnosis Date  . Allergy   . Anemia   . Anemia of chronic disease 09/21/2016  . Anxiety   . Arthritis    arthritis- left hip  . Blood transfusion without reported diagnosis    transfusion- 3-4 yrs ago -found to be anemic on routine lab check  . Breast cancer ([place]) 2002   bilateral- bilateral mastectomies done.  . 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 CVA (cerebral infarction) 04/30/2013  . Encounter for antineoplastic chemotherapy 10/28/2016  . Family history of brain cancer   . Family history of breast cancer   . Family history of colon cancer 10/06/2009  . Family history of colon cancer   . Family history of prostate cancer   . GERD (gastroesophageal reflux disease)   . Glaucoma   . History of bilateral mastectomy  05/20/2010  . Hypertension   . Lung cancer ([place]) dx'd 12/2010   last Ct scan "no lung cancer" showing 11'16 CT Chest Epic.  . Malignant neoplasm of ascending colon ([place]) 01/29/2016  . Stroke [facility])    2 yrs ago-no residual  . Tubular adenoma of colon 07/2011   colon polyps ans reoccurence with malignancy found    Allergies Allergies  Allergen Reactions  . Doxycycline Nausea Only    Weak, stomach  . Sulfa Antibiotics Rash    IV Location/Drains/Wounds Patient Lines/Drains/Airways Status   Active Line/Drains/Airways    Name:   Placement date:   Placement time:   Site:   Days:   Peripheral IV 09/10/16 Left Antecubital   09/10/16    1511    Antecubital   639   Peripheral IV 10/02/16 Left Antecubital   10/02/16    0746    Antecubital   617   Peripheral IV 04/25/17 Left Arm   04/25/17    1300    Arm   412   Peripheral IV 04/28/17 Left Antecubital   04/28/17    1519    Antecubital   409   Peripheral IV 09/26/17 Left Antecubital   09/26/17    1117    Antecubital   258   Peripheral IV 01/23/18 Left Antecubital   01/23/18    1110    Antecubital  139   Peripheral IV 05/04/18 Left Antecubital   05/04/18    1225    Antecubital   38   Peripheral IV 06/11/18 Left Antecubital   06/11/18    1855    Antecubital   less than 1   Incision (Closed) 03/06/16 Abdomen   03/06/16    1330     827   Incision (Closed) 08/07/16 Hip Left   08/07/16    1049     673   Incision (Closed) 10/02/16 Chest Other (Comment)   10/02/16    0946     617   Incision - 4 Ports Abdomen Right;Lateral;Lower Mid;Lower Left;Lateral Left;Medial;Lateral   03/06/16    1130     827          Labs/Imaging Results for orders placed or performed during the hospital encounter of 06/11/18 (from the past 48 hour(s))  Urinalysis, Routine w reflex microscopic- may I&O cath if menses     Status: Abnormal   Collection Time: 06/11/18  6:01 PM  Result Value Ref Range   Color, Urine YELLOW YELLOW   APPearance CLEAR CLEAR   Specific Gravity,  Urine 1.024 1.005 - 1.030   pH 5.0 5.0 - 8.0   Glucose, UA NEGATIVE NEGATIVE mg/dL   Hgb urine dipstick NEGATIVE NEGATIVE   Bilirubin Urine NEGATIVE NEGATIVE   Ketones, ur NEGATIVE NEGATIVE mg/dL   Protein, ur NEGATIVE NEGATIVE mg/dL   Nitrite NEGATIVE NEGATIVE   Leukocytes, UA MODERATE (A) NEGATIVE   RBC / HPF 0-5 0 - 5 RBC/hpf   WBC, UA 11-20 0 - 5 WBC/hpf   Bacteria, UA RARE (A) NONE SEEN   Squamous Epithelial / LPF 0-5 0 - 5   Mucus PRESENT    Hyaline Casts, UA PRESENT     Comment: Performed at [facility], [place] [address], [place], [place] [address]  Lipase, blood     Status: Abnormal   Collection Time: 06/11/18  6:56 PM  Result Value Ref Range   Lipase 55 (H) 11 - 51 U/L    Comment: Performed at [facility], [place] [address], [place], [place] [address]  Comprehensive metabolic panel     Status: Abnormal   Collection Time: 06/11/18  6:56 PM  Result Value Ref Range   Sodium 138 135 - 145 mmol/L   Potassium 4.6 3.5 - 5.1 mmol/L   Chloride 108 98 - 111 mmol/L   CO2 23 22 - 32 mmol/L   Glucose, Bld 146 (H) 70 - 99 mg/dL   BUN 27 (H) 8 - 23 mg/dL   Creatinine, Ser 1.51 (H) 0.44 - 1.00 mg/dL   Calcium 9.5 8.9 - 10.3 mg/dL   Total Protein 7.0 6.5 - 8.1 g/dL   Albumin 3.2 (L) 3.5 - 5.0 g/dL   AST 14 (L) 15 - 41 U/L   ALT 11 0 - 44 U/L   Alkaline Phosphatase 77 38 - 126 U/L   Total Bilirubin 0.3 0.3 - 1.2 mg/dL   GFR calc non Af Amer 32 (L) >60 mL/min   GFR calc Af Amer 37 (L) >60 mL/min    Comment: (NOTE) The eGFR has been calculated using the CKD EPI equation. This calculation has not been validated in all clinical situations. eGFR's persistently <60 mL/min signify possible Chronic Kidney Disease.    Anion gap 7 5 - 15    Comment: Performed at [facility], [place] [address] [place], [place] [address]  CBC  Status: Abnormal   Collection Time: 06/11/18  6:56 PM  Result Value Ref Range   WBC 8.9 4.0 - 10.5 K/uL    RBC 4.06 3.87 - 5.11 MIL/uL   Hemoglobin 11.9 (L) 12.0 - 15.0 g/dL   HCT 37.3 36.0 - 46.0 %   MCV 91.9 78.0 - 100.0 fL   MCH 29.3 26.0 - 34.0 pg   MCHC 31.9 30.0 - 36.0 g/dL   RDW 13.3 11.5 - 15.5 %   Platelets 522 (H) 150 - 400 K/uL    Comment: Performed at [facility], [place] [address], [place], [place] [address]  Differential     Status: Abnormal   Collection Time: 06/11/18  6:56 PM  Result Value Ref Range   Neutrophils Relative % 77 %   Neutro Abs 6.8 1.7 - 7.7 K/uL   Lymphocytes Relative 8 %   Lymphs Abs 0.7 0.7 - 4.0 K/uL   Monocytes Relative 15 %   Monocytes Absolute 1.3 (H) 0.1 - 1.0 K/uL   Eosinophils Relative 0 %   Eosinophils Absolute 0.0 0.0 - 0.7 K/uL   Basophils Relative 0 %   Basophils Absolute 0.0 0.0 - 0.1 K/uL    Comment: Performed at [facility], [place] [address] [place], [place] [address]   Ct Abdomen Pelvis W Contrast  Result Date: 06/11/2018 CLINICAL DATA:  Persistent lower abdominal pain and pressure. History of right colectomy for ascending colon cancer. EXAM: CT ABDOMEN AND PELVIS WITH CONTRAST TECHNIQUE: Multidetector CT imaging of the abdomen and pelvis was performed using the standard protocol following bolus administration of intravenous contrast. CONTRAST:  72m ISOVUE-300 IOPAMIDOL (ISOVUE-300) INJECTION 61% COMPARISON:  05/04/2018 chest CT FINDINGS: Lower chest: Normal heart size. No pericardial effusion. Bibasilar dependent atelectasis. Moderate-sized hiatal hernia. Hepatobiliary: Stable 2.6 cm cyst in the right hepatic lobe. No enhancing mass lesions of the liver. Physiologic distention of the gallbladder without mural thickening or pericholecystic fluid. No biliary dilatation. Pancreas: No ductal dilatation, mass inflammation.  Mild atrophy. Spleen: No splenomegaly or mass. Adrenals/Urinary Tract: Normal bilateral adrenal glands. Symmetric cortical enhancement of both kidneys with small interpolar cyst in the right  kidney unchanged in appearance measuring approximately 4 mm. No nephrolithiasis nor hydroureteronephrosis. Urinary bladder is physiologically distended without focal mural thickening or calculi. Stomach/Bowel: Moderate-sized hiatal hernia. Nondistended stomach. Diffuse fluid-filled distended small and large bowel loops are identified to the level of the rectosigmoid where there is segmental transmural thickening noted causing large and small bowel obstruction. Dilatation of the colon to 9 cm is noted. The patient is status post right colectomy. Although findings may be secondary to a severe colitis, neoplasm is of concern. Vascular/Lymphatic: Aortoiliac atherosclerosis without aneurysm. No adenopathy. Reproductive: Fibroid uterus.  No adnexal mass. Other: No free air nor free fluid. Musculoskeletal: Degenerative changes are noted along the dorsal spine without acute nor aggressive osseous lesions. IMPRESSION: 1. Short segmental thickening in the rectosigmoid causing large and small-bowel obstruction. Clinical correlation to exclude neoplasm is recommended. Marked inflammatory thickening is a differential possibility. Dilatation of fluid-filled large bowel is noted to 9 cm. 2. Status post right partial colectomy.  Intact staple line. 3. Stable 2.6 cm in the right hepatic lobe compatible with a cyst. 4. Moderate-sized hiatal hernia. Electronically Signed   By: DAshley RoyaltyM.D.   On: 06/11/2018 20:49    Pending Labs Unresulted Labs (From admission, onward)    Start     Ordered   06/12/18 07654 Basic metabolic panel  Tomorrow morning,   R     06/11/18 2202   06/12/18 0500  CBC  Tomorrow morning,   R     06/11/18 2202   06/11/18 2204  Culture, Urine  Add-on,   R     06/11/18 2203          Vitals/Pain Today's Vitals   06/11/18 1930 06/11/18 2038 06/11/18 2100 06/11/18 2206  BP: (!) 139/56 137/61 131/60   Pulse: 70 72 66   Resp: (!) 22 (!) 22 (!) 21   Temp:      TempSrc:      SpO2: 97% 97% 98%    PainSc:    5     Isolation Precautions No active isolations  Medications Medications  iopamidol (ISOVUE-300) 61 % injection (has no administration in time range)  ondansetron (ZOFRAN) injection 4 mg (has no administration in time range)  fentaNYL (SUBLIMAZE) injection 25-50 mcg (25 mcg Intravenous Given 06/11/18 2203)  0.9 %  sodium chloride infusion (has no administration in time range)  brimonidine-timolol (COMBIGAN) 0.2-0.5 % ophthalmic solution 1 drop (has no administration in time range)  cycloSPORINE (RESTASIS) 0.05 % ophthalmic emulsion 1 drop (has no administration in time range)  famotidine (PEPCID) IVPB 20 mg premix (has no administration in time range)  heparin injection 5,000 Units (has no administration in time range)  acetaminophen (TYLENOL) tablet 650 mg (has no administration in time range)    Or  acetaminophen (TYLENOL) suppository 650 mg (has no administration in time range)  promethazine (PHENERGAN) tablet 12.5 mg (has no administration in time range)  cefTRIAXone (ROCEPHIN) 1 g in sodium chloride 0.9 % 100 mL IVPB (has no administration in time range)  hydrALAZINE (APRESOLINE) injection 10 mg (has no administration in time range)  sodium chloride 0.9 % bolus 1,000 mL (1,000 mLs Intravenous New Bag/Given 06/11/18 1900)  ondansetron (ZOFRAN) injection 4 mg (4 mg Intravenous Given 06/11/18 1857)  HYDROmorphone (DILAUDID) injection 0.5 mg (0.5 mg Intravenous Given 06/11/18 1857)  iopamidol (ISOVUE-300) 61 % injection 80 mL (80 mLs Intravenous Contrast Given 06/11/18 2001)    Mobility walks

## 2018-06-11 NOTE — H&P (Signed)
History and Physical    Paw Karstens RXV:[id number] DOB: August 04, 1941 DOA: 06/11/2018  PCP: Eber Hong, MD   Patient coming from: Home   Chief Complaint: Abdominal pain, N/V, urinary urgency and frequency   HPI: Kristin [name] is a 77 y.o. 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  Patient also reports some urinary symptoms.  Symptoms began almost a week ago with generalized abdominal discomfort, nausea, vomiting, and watery diarrhea.  Over the ensuing days, pain became more localized to the lower abdomen and she has not had any diarrhea since 06/09/2018.  She continues to have nausea with nonbloody vomiting.  She denies fevers or chills.  She was seen at an outside emergency department early in the course, not provided any specific diagnosis at that time, but was called back, and informed that she had a UTI, and was given a prescription for Keflex.  She is status post right colectomy in May 2017 and had a colonoscopy in July 2018 with polyps in the transverse and descending colon, no dysplasia on pathology.  ED Course: Upon arrival to the ED, patient is found to be afebrile, saturating well on room air, and slightly tachycardic.  Chemistry panel is notable for creatinine 1.51, slightly up from her apparent baseline of roughly 1.3.  CBC is notable for a new mild thrombocytosis and urinalysis is suggestive of possible infection.  CT of the abdomen and pelvis reveals a short segment of thickening in the rectosigmoid colon causing obstruction.  Surgery was consulted by the ED physician and they plan to see the patient in the morning.  She was treated with 1 L of normal saline, Dilaudid, and Zofran in the ED.  She remains hemodynamically  stable and will be admitted for ongoing evaluation and management.  Review of Systems:  All other systems reviewed and apart from HPI, are negative.  Past Medical History:  Diagnosis Date  . Allergy   . Anemia   . Anemia of chronic disease 09/21/2016  . Anxiety   . Arthritis    arthritis- left hip  . Blood transfusion without reported diagnosis    transfusion- 3-4 yrs ago -found to be anemic on routine lab check  . Breast cancer ([place]) 2002   bilateral- bilateral mastectomies done.  . 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 CVA (cerebral infarction) 04/30/2013  . Encounter for antineoplastic chemotherapy 10/28/2016  . Family history of brain cancer   . Family history of breast cancer   . Family history of colon cancer 10/06/2009  . Family history of colon cancer   . Family history of prostate cancer   . GERD (gastroesophageal reflux disease)   . Glaucoma   . History of bilateral mastectomy 05/20/2010  . Hypertension   . Lung cancer ([place]) dx'd 12/2010   last Ct scan "no lung cancer" showing 11'16 CT Chest Epic.  . Malignant neoplasm of ascending colon ([place]) 01/29/2016  . Stroke [facility])    2 yrs ago-no residual  . Tubular adenoma of colon 07/2011   colon polyps ans reoccurence with malignancy found    Past Surgical History:  Procedure Laterality Date  . APPENDECTOMY    . cataracts Bilateral   . COLON SURGERY  02/2016  . COLONOSCOPY    . EYE SURGERY    . LUNG SURGERY  Resection -" not done. Pt . tx with Tarceva  . MASTECTOMY Bilateral   . MEDIASTINOSCOPY N/A 10/02/2016   Procedure: MEDIASTINOSCOPY;  Surgeon: [name], MD;  Location: [facility] OR;  Service: Thoracic;  Laterality: N/A;  . RECONSTRUCTION BREAST W/ TRAM FLAP Bilateral    bilaterally  . TEE WITHOUT CARDIOVERSION N/A 06/04/2013   Procedure: TRANSESOPHAGEAL ECHOCARDIOGRAM (TEE);  Surgeon: Jolaine Artist, MD;  Location: [facility] ENDOSCOPY;  Service: Cardiovascular;  Laterality: N/A;  . TONSILLECTOMY    . TOTAL HIP  ARTHROPLASTY Left 08/07/2016   Procedure: LEFT TOTAL HIP ARTHROPLASTY ANTERIOR APPROACH;  Surgeon: Gaynelle Arabian, MD;  Location: [facility] ORS;  Service: Orthopedics;  Laterality: Left;     reports that she has never smoked. She has never used smokeless tobacco. She reports that she drinks alcohol. She reports that she does not use drugs.  Allergies  Allergen Reactions  . Doxycycline Nausea Only    Weak, stomach  . Sulfa Antibiotics Rash    Family History  Problem Relation Age of Onset  . Lung cancer Father   . Heart disease Mother   . Clotting disorder Mother 70       coronary thrombosis  . Colon cancer Sister        dx in her 39s  . Breast cancer Sister 96  . Multiple myeloma Paternal Grandmother   . Brain cancer Paternal Uncle   . Cancer Paternal Aunt        unknown  . Colon cancer Maternal Aunt 80  . Prostate cancer Maternal Uncle        dx in his late 37s  . Other Maternal Uncle        brain tumor dx in his 56s  . Lung cancer Maternal Uncle   . Cancer Maternal Uncle        NOS  . Stomach cancer Paternal Aunt   . Brain cancer Cousin        maternal first cousin dx in late 8s-50s  . Cancer Paternal Uncle        NOS  . Colon cancer Cousin        paternal cousin dx in his 52s  . Cancer Cousin        paternal cousin dx with NOS cancer     Prior to Admission medications   Medication Sig Start Date End Date Taking? Authorizing Provider  alendronate (FOSAMAX) 70 MG tablet Take 70 mg once a week by mouth. 08/20/17  Yes [provider]  amLODipine (NORVASC) 5 MG tablet Take 5 mg by mouth daily. Per Pt -she takes 2.5 mg in am and 5 mg in pm   Yes [provider]  brimonidine-timolol (COMBIGAN) 0.2-0.5 % ophthalmic solution Place 1 drop into the right eye every 12 (twelve) hours.   Yes [provider]  cephALEXin (KEFLEX) 250 MG capsule Take 250 mg by mouth 4 (four) times daily. 06/10/18  Yes [provider]  Cholecalciferol (VITAMIN D3) 2000  units capsule Take 2,000 Units daily by mouth.   Yes [provider]  clopidogrel (PLAVIX) 75 MG tablet Take 75 mg by mouth daily. 06/25/16  Yes [provider]  cycloSPORINE (RESTASIS) 0.05 % ophthalmic emulsion Place 1 drop into both eyes 2 (two) times daily.   Yes [provider]  escitalopram (LEXAPRO) 10 MG tablet Take 10 mg by mouth daily.  05/20/13  Yes Philmore Pali, NP  famotidine (PEPCID) 40 MG tablet Take 40 mg by mouth daily.  Yes [provider]  letrozole ([place]) 2.5 MG tablet [place] Patient taking differently: Take 2.5 mg by mouth daily.  09/01/17  Yes Curt Bears, MD  lisinopril (PRINIVIL,ZESTRIL) 20 MG tablet Take 1 tablet by mouth 2 (two) times daily. 12/13/16  Yes [provider]  ondansetron (ZOFRAN-ODT) 4 MG disintegrating tablet Take 4 mg by mouth every 8 (eight) hours as needed for nausea. 06/09/18  Yes [provider]  osimertinib mesylate (TAGRISSO) 80 MG tablet Take 1 tablet (80 mg total) by mouth daily. 01/26/18  Yes Curt Bears, MD  FeFum-FePoly-FA-B Cmp-C-Biot [facility]) CAPS TAKE ONE CAPSULE BY MOUTH EACH DAY Patient not taking: Reported on 06/11/2018 06/12/17   Curt Bears, MD    Physical Exam: Vitals:   06/11/18 1900 06/11/18 1930 06/11/18 2038 06/11/18 2100  BP: (!) 162/63 (!) 139/56 137/61 131/60  Pulse: 73 70 72 66  Resp: (!) 27 (!) 22 (!) 22 (!) 21  Temp:      TempSrc:      SpO2: 100% 97% 97% 98%      Constitutional: NAD, appears uncomfortable  Eyes: PERTLA, lids and conjunctivae normal ENMT: Mucous membranes are moist. Posterior pharynx clear of any exudate or lesions.   Neck: normal, supple, no masses, no thyromegaly Respiratory: clear to auscultation bilaterally, no wheezing, no crackles. Normal respiratory effort.   Cardiovascular: Rate ~110 and regular. No extremity edema.  Abdomen: Mild distension, soft, tender in mid-abdomen and lower quadrants. No rebound  pain or guarding.  Musculoskeletal: no clubbing / cyanosis. No joint deformity upper and lower extremities.   Skin: no significant rashes, lesions, ulcers. Poor turgor.  Neurologic: CN 2-12 grossly intact. Sensation intact. Strength 5/5 in all 4 limbs.  Psychiatric: Alert and oriented x 3. Pleasant and cooperative.     Labs on Admission: I have personally reviewed following labs and imaging studies  CBC: Recent Labs  Lab 06/11/18 1856  WBC 8.9  NEUTROABS 6.8  HGB 11.9*  HCT 37.3  MCV 91.9  PLT 025*   Basic Metabolic Panel: Recent Labs  Lab 06/11/18 1856  NA 138  K 4.6  CL 108  CO2 23  GLUCOSE 146*  BUN 27*  CREATININE 1.51*  CALCIUM 9.5   GFR: CrCl cannot be calculated (Unknown ideal weight.). Liver Function Tests: Recent Labs  Lab 06/11/18 1856  AST 14*  ALT 11  ALKPHOS 77  BILITOT 0.3  PROT 7.0  ALBUMIN 3.2*   Recent Labs  Lab 06/11/18 1856  LIPASE 55*   No results for input(s): AMMONIA in the last 168 hours. Coagulation Profile: No results for input(s): INR, PROTIME in the last 168 hours. Cardiac Enzymes: No results for input(s): CKTOTAL, CKMB, CKMBINDEX, TROPONINI in the last 168 hours. BNP (last 3 results) No results for input(s): PROBNP in the last 8760 hours. HbA1C: No results for input(s): HGBA1C in the last 72 hours. CBG: No results for input(s): GLUCAP in the last 168 hours. Lipid Profile: No results for input(s): CHOL, HDL, LDLCALC, TRIG, CHOLHDL, LDLDIRECT in the last 72 hours. Thyroid Function Tests: No results for input(s): TSH, T4TOTAL, FREET4, T3FREE, THYROIDAB in the last 72 hours. Anemia Panel: No results for input(s): VITAMINB12, FOLATE, FERRITIN, TIBC, IRON, RETICCTPCT in the last 72 hours. Urine analysis:    Component Value Date/Time   COLORURINE YELLOW 06/11/2018 1801   APPEARANCEUR CLEAR 06/11/2018 1801   LABSPEC 1.024 06/11/2018 1801   PHURINE 5.0 06/11/2018 1801   GLUCOSEU NEGATIVE 06/11/2018 1801   HGBUR NEGATIVE  06/11/2018 1801   BILIRUBINUR NEGATIVE 06/11/2018 1801   KETONESUR NEGATIVE 06/11/2018 1801   PROTEINUR NEGATIVE 06/11/2018 1801   UROBILINOGEN 0.2 04/29/2013 2243   NITRITE NEGATIVE 06/11/2018 1801   LEUKOCYTESUR MODERATE (A) 06/11/2018 1801   Sepsis Labs: _0 (procalcitonin:4,lacticidven:4) )No results found for this or any previous visit (from the past 240 hour(s)).   Radiological Exams on Admission: Ct Abdomen Pelvis W Contrast  Result Date: 06/11/2018 CLINICAL DATA:  Persistent lower abdominal pain and pressure. History of right colectomy for ascending colon cancer. EXAM: CT ABDOMEN AND PELVIS WITH CONTRAST TECHNIQUE: Multidetector CT imaging of the abdomen and pelvis was performed using the standard protocol following bolus administration of intravenous contrast. CONTRAST:  13m ISOVUE-300 IOPAMIDOL (ISOVUE-300) INJECTION 61% COMPARISON:  05/04/2018 chest CT FINDINGS: Lower chest: Normal heart size. No pericardial effusion. Bibasilar dependent atelectasis. Moderate-sized hiatal hernia. Hepatobiliary: Stable 2.6 cm cyst in the right hepatic lobe. No enhancing mass lesions of the liver. Physiologic distention of the gallbladder without mural thickening or pericholecystic fluid. No biliary dilatation. Pancreas: No ductal dilatation, mass inflammation.  Mild atrophy. Spleen: No splenomegaly or mass. Adrenals/Urinary Tract: Normal bilateral adrenal glands. Symmetric cortical enhancement of both kidneys with small interpolar cyst in the right kidney unchanged in appearance measuring approximately 4 mm. No nephrolithiasis nor hydroureteronephrosis. Urinary bladder is physiologically distended without focal mural thickening or calculi. Stomach/Bowel: Moderate-sized hiatal hernia. Nondistended stomach. Diffuse fluid-filled distended small and large bowel loops are identified to the level of the rectosigmoid where there is segmental transmural thickening noted causing large and small bowel  obstruction. Dilatation of the colon to 9 cm is noted. The patient is status post right colectomy. Although findings may be secondary to a severe colitis, neoplasm is of concern. Vascular/Lymphatic: Aortoiliac atherosclerosis without aneurysm. No adenopathy. Reproductive: Fibroid uterus.  No adnexal mass. Other: No free air nor free fluid. Musculoskeletal: Degenerative changes are noted along the dorsal spine without acute nor aggressive osseous lesions. IMPRESSION: 1. Short segmental thickening in the rectosigmoid causing large and small-bowel obstruction. Clinical correlation to exclude neoplasm is recommended. Marked inflammatory thickening is a differential possibility. Dilatation of fluid-filled large bowel is noted to 9 cm. 2. Status post right partial colectomy.  Intact staple line. 3. Stable 2.6 cm in the right hepatic lobe compatible with a cyst. 4. Moderate-sized hiatal hernia. Electronically Signed   By: DAshley RoyaltyM.D.   On: 06/11/2018 20:49    EKG: Not performed.   Assessment/Plan   1. Bowel obstruction  - Presents with one week of abdominal pain and N/V; she was having watery diarrhea early in the course but none since 8/20  - She was seen at another ED early in the course, was called back today, informed she had UTI, and prescribed Keflex - CT abd/pelvis in ED today with thickening involving a short segment of rectosigmoid colon causing obstruction  - Surgery was consulted by ED physician and plan to see in am  - Continue bowel-rest, IVF hydration, analgesia, antiemetics, serial exams    2. UTI  - She has lower abdominal pain from #1 but also complains of urinary symptoms  - No fever or leukocytosis  - UA consistent with possible infection and sample will be sent for culture  - Start Rocephin, follow culture and clinical course    3. History of colon cancer  - She had adenoma of ascending colon diagnosed in March 2017 and underwent right colectomy in May 2017  - Colonoscopy in  July 2018 with polyps in  transverse and descending colon, no dysplasia on pathology  - She continues oncology follow-up for observation and there was no concerning findings for disease progression on CT last month per oncology notes   4. Lung cancer  - Following with oncology and managed with Tagrisso  - CT C/A/P last month with no concerning findings for disease progression per oncology notes    5. History of breast cancer  - She continues to follow with oncology and is managed with Femara    6. Hypertension  - BP at goal  - Use hydralazine IVP's prn while NPO   7. CKD III  - SCr is 1.51 on admission, slightly up from priors  - Lisinopril held while NPO, continue IVF hydration, renally-dose medications, and repeat chem panel in am    DVT prophylaxis: sq heparin  Code Status: Full  Family Communication: Daughter and husband updated at bedside Consults called: Surgery Admission status: Inpatient     Vianne Bulls, MD [facility] Pager [phone]  If 7PM-7AM, please contact night-coverage [email or web address] Password [facility]  06/11/2018, 10:04 PM

## 2018-06-11 NOTE — ED Provider Notes (Signed)
[facility] Provider Note   CSN: [id number] Arrival date & time: 06/11/18  1747     History   Chief Complaint Chief Complaint  Patient presents with  . Abdominal Pain    HPI Kristin [name] is a 77 y.o. female.  Patient has a history of lung cancer colon cancer and breast cancer.  She had surgery on her colon a few years ago.  She is on chemotherapy for her lung cancer.  Patient has been having abdominal pain and poor p.o. intake.  The history is provided by the patient and a relative. No language interpreter was used.  Abdominal Pain   This is a new problem. The current episode started more than 2 days ago. The problem occurs constantly. The problem has not changed since onset.The pain is associated with an unknown factor. The pain is located in the generalized abdominal region. The quality of the pain is aching. The pain is at a severity of 5/10. The pain is moderate. Associated symptoms include anorexia. Pertinent negatives include diarrhea, frequency, hematuria and headaches. Nothing aggravates the symptoms. Nothing relieves the symptoms.    Past Medical History:  Diagnosis Date  . Allergy   . Anemia   . Anemia of chronic disease 09/21/2016  . Anxiety   . Arthritis    arthritis- left hip  . Blood transfusion without reported diagnosis    transfusion- 3-4 yrs ago -found to be anemic on routine lab check  . Breast cancer ([place]) 2002   bilateral- bilateral mastectomies done.  . 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 CVA (cerebral infarction) 04/30/2013  . Encounter for antineoplastic chemotherapy 10/28/2016  . Family history of brain cancer   . Family history of breast cancer   . Family history of colon cancer 10/06/2009  . Family history of colon cancer   . Family history of prostate cancer   . GERD (gastroesophageal reflux disease)   . Glaucoma   . History of bilateral mastectomy 05/20/2010  . Hypertension   . Lung cancer ([place]) dx'd  12/2010   last Ct scan "no lung cancer" showing 11'16 CT Chest Epic.  . Malignant neoplasm of ascending colon ([place]) 01/29/2016  . Stroke [facility])    2 yrs ago-no residual  . Tubular adenoma of colon 07/2011   colon polyps ans reoccurence with malignancy found    Patient Active Problem List   Diagnosis Date Noted  . Bowel obstruction ([place]) 06/11/2018  . CKD (chronic kidney disease), stage III ([place]) 06/11/2018  . Large bowel obstruction ([place]) 06/11/2018  . Acute lower UTI 06/11/2018  . Encounter for antineoplastic chemotherapy 10/28/2016  . Anemia of chronic disease 09/21/2016  . 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 S/P hip replacement, left 08/07/2016  . 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 Genetic testing 06/20/2016  . Family history of breast cancer   . Family history of colon cancer   . Family history of brain cancer   . Family history of prostate cancer   . Melanocytic nevi of trunk 03/07/2016  . Seborrheic keratoses of trunk 03/06/2016  . Status post partial colectomy 03/06/2016  . 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 Osteoporosis 04/25/2015  . Disc disorder 05/30/2014  . Arthritis of left hip 05/30/2014  . Occipital cerebral infarction ([place]) 04/30/2013  . Hypertension 04/29/2013  . Nonspecific abnormal finding in stool contents 07/31/2011  . Esophageal reflux 07/31/2011  .  Benign neoplasm of colon 07/31/2011  . Hernia, hiatal 07/31/2011  . Cancer of upper lobe of left lung ([place]) 06/13/2011  . Personal history of breast cancer 06/13/2011  . Diverticulosis of colon (without mention of hemorrhage) 06/13/2011  . Adenocarcinoma of lung ([place]) 01/01/2011  . Status of breast implant 05/20/2010  . Menopausal syndrome 07/03/2004  . Hypercholesterolemia 01/05/2002  . Absence of bladder continence 12/02/2000  . Depression 12/02/2000    Past Surgical  History:  Procedure Laterality Date  . APPENDECTOMY    . cataracts Bilateral   . COLON SURGERY  02/2016  . COLONOSCOPY    . EYE SURGERY    . LUNG SURGERY     Resection -" not done. Pt . tx with Tarceva  . MASTECTOMY Bilateral   . MEDIASTINOSCOPY N/A 10/02/2016   Procedure: MEDIASTINOSCOPY;  Surgeon: [name], MD;  Location: [facility] OR;  Service: Thoracic;  Laterality: N/A;  . RECONSTRUCTION BREAST W/ TRAM FLAP Bilateral    bilaterally  . TEE WITHOUT CARDIOVERSION N/A 06/04/2013   Procedure: TRANSESOPHAGEAL ECHOCARDIOGRAM (TEE);  Surgeon: Jolaine Artist, MD;  Location: [facility] ENDOSCOPY;  Service: Cardiovascular;  Laterality: N/A;  . TONSILLECTOMY    . TOTAL HIP ARTHROPLASTY Left 08/07/2016   Procedure: LEFT TOTAL HIP ARTHROPLASTY ANTERIOR APPROACH;  Surgeon: Gaynelle Arabian, MD;  Location: [facility] ORS;  Service: Orthopedics;  Laterality: Left;     OB History   None      Home Medications    Prior to Admission medications   Medication Sig Start Date End Date Taking? Authorizing Provider  alendronate (FOSAMAX) 70 MG tablet Take 70 mg once a week by mouth. 08/20/17  Yes [provider]  amLODipine (NORVASC) 5 MG tablet Take 5 mg by mouth daily. Per Pt -she takes 2.5 mg in am and 5 mg in pm   Yes [provider]  brimonidine-timolol (COMBIGAN) 0.2-0.5 % ophthalmic solution Place 1 drop into the right eye every 12 (twelve) hours.   Yes [provider]  cephALEXin (KEFLEX) 250 MG capsule Take 250 mg by mouth 4 (four) times daily. 06/10/18  Yes [provider]  Cholecalciferol (VITAMIN D3) 2000 units capsule Take 2,000 Units daily by mouth.   Yes [provider]  clopidogrel (PLAVIX) 75 MG tablet Take 75 mg by mouth daily. 06/25/16  Yes [provider]  cycloSPORINE (RESTASIS) 0.05 % ophthalmic emulsion Place 1 drop into both eyes 2 (two) times daily.   Yes [provider]  escitalopram (LEXAPRO) 10 MG tablet Take 10 mg by mouth  daily.  05/20/13  Yes Philmore Pali, NP  famotidine (PEPCID) 40 MG tablet Take 40 mg by mouth daily.   Yes [provider]  letrozole ([place]) 2.5 MG tablet [place] Patient taking differently: Take 2.5 mg by mouth daily.  09/01/17  Yes Curt Bears, MD  lisinopril (PRINIVIL,ZESTRIL) 20 MG tablet Take 1 tablet by mouth 2 (two) times daily. 12/13/16  Yes [provider]  ondansetron (ZOFRAN-ODT) 4 MG disintegrating tablet Take 4 mg by mouth every 8 (eight) hours as needed for nausea. 06/09/18  Yes [provider]  osimertinib mesylate (TAGRISSO) 80 MG tablet Take 1 tablet (80 mg total) by mouth daily. 01/26/18  Yes Curt Bears, MD  FeFum-FePoly-FA-B Cmp-C-Biot Birder Robson) CAPS TAKE ONE CAPSULE BY MOUTH EACH DAY Patient not taking: Reported on 06/11/2018 06/12/17   Curt Bears, MD    Family History Family History  Problem Relation Age of Onset  . Lung  cancer Father   . Heart disease Mother   . Clotting disorder Mother 47       coronary thrombosis  . Colon cancer Sister        dx in her 69s  . Breast cancer Sister 37  . Multiple myeloma Paternal Grandmother   . Brain cancer Paternal Uncle   . Cancer Paternal Aunt        unknown  . Colon cancer Maternal Aunt 80  . Prostate cancer Maternal Uncle        dx in his late 37s  . Other Maternal Uncle        brain tumor dx in his 44s  . Lung cancer Maternal Uncle   . Cancer Maternal Uncle        NOS  . Stomach cancer Paternal Aunt   . Brain cancer Cousin        maternal first cousin dx in late 55s-50s  . Cancer Paternal Uncle        NOS  . Colon cancer Cousin        paternal cousin dx in his 49s  . Cancer Cousin        paternal cousin dx with NOS cancer    Social History Social History   Tobacco Use  . Smoking status: Never Smoker  . Smokeless tobacco: Never Used  Substance Use Topics  . Alcohol use: Yes    Alcohol/week: 0.0 standard drinks    Comment: ocassional beer  .  Drug use: No     Allergies   Doxycycline and Sulfa antibiotics   Review of Systems Review of Systems  Constitutional: Negative for appetite change and fatigue.  HENT: Negative for congestion, ear discharge and sinus pressure.   Eyes: Negative for discharge.  Respiratory: Negative for cough.   Cardiovascular: Negative for chest pain.  Gastrointestinal: Positive for abdominal pain and anorexia. Negative for diarrhea.  Genitourinary: Negative for frequency and hematuria.  Musculoskeletal: Negative for back pain.  Skin: Negative for rash.  Neurological: Negative for seizures and headaches.  Psychiatric/Behavioral: Negative for hallucinations.     Physical Exam Updated Vital Signs BP (!) 148/57 (BP Location: Right Arm)   Pulse 71   Temp 98.9 F (37.2 C) (Oral)   Resp 16   SpO2 96%   Physical Exam  Constitutional: She is oriented to person, place, and time. She appears well-developed.  HENT:  Head: Normocephalic.  Eyes: Conjunctivae and EOM are normal. No scleral icterus.  Neck: Neck supple. No thyromegaly present.  Cardiovascular: Normal rate and regular rhythm. Exam reveals no gallop and no friction rub.  No murmur heard. Pulmonary/Chest: No stridor. She has no wheezes. She has no rales. She exhibits no tenderness.  Abdominal: She exhibits no distension. There is no tenderness. There is no rebound.  Musculoskeletal: Normal range of motion. She exhibits no edema.  Lymphadenopathy:    She has no cervical adenopathy.  Neurological: She is oriented to person, place, and time. She exhibits normal muscle tone. Coordination normal.  Skin: No rash noted. No erythema.  Psychiatric: She has a normal mood and affect. Her behavior is normal.     ED Treatments / Results  Labs (all labs ordered are listed, but only abnormal results are displayed) Labs Reviewed  URINALYSIS, ROUTINE W REFLEX MICROSCOPIC - Abnormal; Notable for the following components:      Result Value    Leukocytes, UA MODERATE (*)    Bacteria, UA RARE (*)    All other components within  normal limits  LIPASE, BLOOD - Abnormal; Notable for the following components:   Lipase 55 (*)    All other components within normal limits  COMPREHENSIVE METABOLIC PANEL - Abnormal; Notable for the following components:   Glucose, Bld 146 (*)    BUN 27 (*)    Creatinine, Ser 1.51 (*)    Albumin 3.2 (*)    AST 14 (*)    GFR calc non Af Amer 32 (*)    GFR calc Af Amer 37 (*)    All other components within normal limits  CBC - Abnormal; Notable for the following components:   Hemoglobin 11.9 (*)    Platelets 522 (*)    All other components within normal limits  DIFFERENTIAL - Abnormal; Notable for the following components:   Monocytes Absolute 1.3 (*)    All other components within normal limits  URINE CULTURE  BASIC METABOLIC PANEL  CBC    EKG None  Radiology Ct Abdomen Pelvis W Contrast  Result Date: 06/11/2018 CLINICAL DATA:  Persistent lower abdominal pain and pressure. History of right colectomy for ascending colon cancer. EXAM: CT ABDOMEN AND PELVIS WITH CONTRAST TECHNIQUE: Multidetector CT imaging of the abdomen and pelvis was performed using the standard protocol following bolus administration of intravenous contrast. CONTRAST:  53m ISOVUE-300 IOPAMIDOL (ISOVUE-300) INJECTION 61% COMPARISON:  05/04/2018 chest CT FINDINGS: Lower chest: Normal heart size. No pericardial effusion. Bibasilar dependent atelectasis. Moderate-sized hiatal hernia. Hepatobiliary: Stable 2.6 cm cyst in the right hepatic lobe. No enhancing mass lesions of the liver. Physiologic distention of the gallbladder without mural thickening or pericholecystic fluid. No biliary dilatation. Pancreas: No ductal dilatation, mass inflammation.  Mild atrophy. Spleen: No splenomegaly or mass. Adrenals/Urinary Tract: Normal bilateral adrenal glands. Symmetric cortical enhancement of both kidneys with small interpolar cyst in the right  kidney unchanged in appearance measuring approximately 4 mm. No nephrolithiasis nor hydroureteronephrosis. Urinary bladder is physiologically distended without focal mural thickening or calculi. Stomach/Bowel: Moderate-sized hiatal hernia. Nondistended stomach. Diffuse fluid-filled distended small and large bowel loops are identified to the level of the rectosigmoid where there is segmental transmural thickening noted causing large and small bowel obstruction. Dilatation of the colon to 9 cm is noted. The patient is status post right colectomy. Although findings may be secondary to a severe colitis, neoplasm is of concern. Vascular/Lymphatic: Aortoiliac atherosclerosis without aneurysm. No adenopathy. Reproductive: Fibroid uterus.  No adnexal mass. Other: No free air nor free fluid. Musculoskeletal: Degenerative changes are noted along the dorsal spine without acute nor aggressive osseous lesions. IMPRESSION: 1. Short segmental thickening in the rectosigmoid causing large and small-bowel obstruction. Clinical correlation to exclude neoplasm is recommended. Marked inflammatory thickening is a differential possibility. Dilatation of fluid-filled large bowel is noted to 9 cm. 2. Status post right partial colectomy.  Intact staple line. 3. Stable 2.6 cm in the right hepatic lobe compatible with a cyst. 4. Moderate-sized hiatal hernia. Electronically Signed   By: DAshley RoyaltyM.D.   On: 06/11/2018 20:49    Procedures Procedures (including critical care time)  Medications Ordered in ED Medications  iopamidol (ISOVUE-300) 61 % injection (has no administration in time range)  ondansetron (ZOFRAN) injection 4 mg (has no administration in time range)  fentaNYL (SUBLIMAZE) injection 25-50 mcg (25 mcg Intravenous Given 06/11/18 2203)  0.9 %  sodium chloride infusion ( Intravenous New Bag/Given 06/11/18 2237)  cycloSPORINE (RESTASIS) 0.05 % ophthalmic emulsion 1 drop (1 drop Both Eyes Given 06/11/18 2301)  famotidine  (PEPCID) IVPB 20 mg  premix (20 mg Intravenous New Bag/Given 06/11/18 2256)  heparin injection 5,000 Units (5,000 Units Subcutaneous Given 06/11/18 2257)  acetaminophen (TYLENOL) tablet 650 mg (has no administration in time range)    Or  acetaminophen (TYLENOL) suppository 650 mg (has no administration in time range)  promethazine (PHENERGAN) tablet 12.5 mg (has no administration in time range)  cefTRIAXone (ROCEPHIN) 1 g in sodium chloride 0.9 % 100 mL IVPB (has no administration in time range)  hydrALAZINE (APRESOLINE) injection 10 mg (has no administration in time range)  brimonidine (ALPHAGAN) 0.2 % ophthalmic solution 1 drop (1 drop Right Eye Not Given 06/11/18 2259)    And  timolol (TIMOPTIC) 0.5 % ophthalmic solution 1 drop (1 drop Right Eye Not Given 06/11/18 2302)  sodium chloride 0.9 % bolus 1,000 mL (1,000 mLs Intravenous New Bag/Given 06/11/18 1900)  ondansetron (ZOFRAN) injection 4 mg (4 mg Intravenous Given 06/11/18 1857)  HYDROmorphone (DILAUDID) injection 0.5 mg (0.5 mg Intravenous Given 06/11/18 1857)  iopamidol (ISOVUE-300) 61 % injection 80 mL (80 mLs Intravenous Contrast Given 06/11/18 2001)     Initial Impression / Assessment and Plan / ED Course  I have reviewed the triage vital signs and the nursing notes.  Pertinent labs & imaging results that were available during my care of the patient were reviewed by me and considered in my medical decision making (see chart for details).     CT scan shows small and large bowel  obstruction.  Possible mass in her intestines.  Patient will be admitted by medicine.  Surgery has been consulted and oncology will follow  Final Clinical Impressions(s) / ED Diagnoses   Final diagnoses:  Lower abdominal pain    ED Discharge Orders    None       Milton Ferguson, MD 06/11/18 2328

## 2018-06-12 ENCOUNTER — Inpatient Hospital Stay (HOSPITAL_COMMUNITY): Payer: Medicare Other | Admitting: Anesthesiology

## 2018-06-12 ENCOUNTER — Inpatient Hospital Stay (HOSPITAL_COMMUNITY): Payer: Medicare Other

## 2018-06-12 ENCOUNTER — Encounter (HOSPITAL_COMMUNITY): Admission: EM | Disposition: A | Discharge status: Home Health | Payer: Self-pay | Source: Home / Self Care

## 2018-06-12 ENCOUNTER — Encounter (HOSPITAL_COMMUNITY): Payer: Self-pay | Admitting: Physician Assistant

## 2018-06-12 DIAGNOSIS — K6389 Other specified diseases of intestine: Secondary | ICD-10-CM

## 2018-06-12 DIAGNOSIS — K56609 Unspecified intestinal obstruction, unspecified as to partial versus complete obstruction: Secondary | ICD-10-CM

## 2018-06-12 DIAGNOSIS — K56601 Complete intestinal obstruction, unspecified as to cause: Secondary | ICD-10-CM

## 2018-06-12 DIAGNOSIS — K644 Residual hemorrhoidal skin tags: Secondary | ICD-10-CM

## 2018-06-12 DIAGNOSIS — K5732 Diverticulitis of large intestine without perforation or abscess without bleeding: Secondary | ICD-10-CM

## 2018-06-12 DIAGNOSIS — R1030 Lower abdominal pain, unspecified: Secondary | ICD-10-CM

## 2018-06-12 HISTORY — PX: BOWEL DECOMPRESSION: SHX5532

## 2018-06-12 HISTORY — PX: FLEXIBLE SIGMOIDOSCOPY: SHX5431

## 2018-06-12 LAB — CBC
HCT: 31.7 % — ABNORMAL LOW (ref 36.0–46.0)
Hemoglobin: 10 g/dL — ABNORMAL LOW (ref 12.0–15.0)
MCH: 29.1 pg (ref 26.0–34.0)
MCHC: 31.5 g/dL (ref 30.0–36.0)
MCV: 92.2 fL (ref 78.0–100.0)
PLATELETS: 468 10*3/uL — AB (ref 150–400)
RBC: 3.44 MIL/uL — ABNORMAL LOW (ref 3.87–5.11)
RDW: 13.4 % (ref 11.5–15.5)
WBC: 6.7 10*3/uL (ref 4.0–10.5)

## 2018-06-12 LAB — BASIC METABOLIC PANEL
Anion gap: 5 (ref 5–15)
BUN: 25 mg/dL — AB (ref 8–23)
CALCIUM: 9.1 mg/dL (ref 8.9–10.3)
CHLORIDE: 114 mmol/L — AB (ref 98–111)
CO2: 23 mmol/L (ref 22–32)
CREATININE: 1.32 mg/dL — AB (ref 0.44–1.00)
GFR calc non Af Amer: 37 mL/min — ABNORMAL LOW (ref >60)
GFR, EST AFRICAN AMERICAN: 43 mL/min — AB (ref >60)
Glucose, Bld: 114 mg/dL — ABNORMAL HIGH (ref 70–99)
Potassium: 5.1 mmol/L (ref 3.5–5.1)
Sodium: 142 mmol/L (ref 135–145)

## 2018-06-12 SURGERY — SIGMOIDOSCOPY, FLEXIBLE
Anesthesia: Monitor Anesthesia Care

## 2018-06-12 MED ORDER — PROPOFOL 500 MG/50ML IV EMUL
INTRAVENOUS | Status: DC | PRN
  Administered 2018-06-12: 100 ug/kg/min via INTRAVENOUS

## 2018-06-12 MED ORDER — LACTATED RINGERS IV SOLN
INTRAVENOUS | Status: DC | PRN
  Administered 2018-06-12: 13:00:00 via INTRAVENOUS

## 2018-06-12 MED ORDER — PROPOFOL 10 MG/ML IV BOLUS
INTRAVENOUS | Status: DC | PRN
  Administered 2018-06-12 (×2): 20 mg via INTRAVENOUS

## 2018-06-12 MED ORDER — ONDANSETRON HCL 4 MG/2ML IJ SOLN
INTRAMUSCULAR | Status: DC | PRN
  Administered 2018-06-12: 4 mg via INTRAVENOUS

## 2018-06-12 MED ORDER — LIP MEDEX EX OINT
TOPICAL_OINTMENT | CUTANEOUS | Status: AC
  Administered 2018-06-12: 07:00:00
  Filled 2018-06-12: qty 7

## 2018-06-12 MED ORDER — METRONIDAZOLE IN NACL 5-0.79 MG/ML-% IV SOLN
500.0000 mg | Freq: Three times a day (TID) | INTRAVENOUS | Status: DC
  Administered 2018-06-12 – 2018-06-17 (×15): 500 mg via INTRAVENOUS
  Filled 2018-06-12 (×15): qty 100

## 2018-06-12 MED ORDER — SODIUM CHLORIDE 0.45 % IV SOLN
INTRAVENOUS | Status: DC
  Administered 2018-06-12 – 2018-06-14 (×3): via INTRAVENOUS

## 2018-06-12 MED ORDER — PROPOFOL 10 MG/ML IV BOLUS
INTRAVENOUS | Status: AC
  Filled 2018-06-12: qty 20

## 2018-06-12 NOTE — Anesthesia Preprocedure Evaluation (Addendum)
Anesthesia Evaluation  Patient identified by MRN, date of birth, ID band Patient awake    Reviewed: Allergy & Precautions, NPO status , Patient's Chart, lab work & pertinent test results  Airway Mallampati: II  TM Distance: >3 FB Neck ROM: Full    Dental  (+) Teeth Intact, Dental Advisory Given   Pulmonary    breath sounds clear to auscultation       Cardiovascular hypertension, Pt. on medications  Rhythm:Regular Rate:Normal     Neuro/Psych Anxiety Depression  Neuromuscular disease CVA    GI/Hepatic Neg liver ROS, hiatal hernia, GERD  Medicated,  Endo/Other  negative endocrine ROS  Renal/GU      Musculoskeletal  (+) Arthritis , Osteoarthritis,    Abdominal Normal abdominal exam  (+)   Peds  Hematology   Anesthesia Other Findings   Reproductive/Obstetrics                            Anesthesia Physical Anesthesia Plan  ASA: III  Anesthesia Plan: MAC   Post-op Pain Management:    Induction: Intravenous  PONV Risk Score and Plan: 2 and Propofol infusion and Ondansetron  Airway Management Planned: Simple Face Mask  Additional Equipment: None  Intra-op Plan:   Post-operative Plan:   Informed Consent: I have reviewed the patients History and Physical, chart, labs and discussed the procedure including the risks, benefits and alternatives for the proposed anesthesia with the patient or authorized representative who has indicated his/her understanding and acceptance.     Plan Discussed with: CRNA  Anesthesia Plan Comments:        Anesthesia Quick Evaluation

## 2018-06-12 NOTE — Progress Notes (Signed)
PROGRESS NOTE  Kristin [name] UMP:[id number] DOB: 11-Aug-1941 DOA: 06/11/2018 PCP: Eber Hong, MD  HPI/Recap of past 24 hours: Kristin [name] is a 77 y.o. 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 Patient also reports some urinary symptoms and was recently prescribed Keflex. In the ED, CT of the abdomen and pelvis reveals a short segment of thickening in the rectosigmoid colon causing obstruction. UA suggestive of infection. Surgery consulted. Pt admitted for further management.   Today, pt reported abdominal pain is resolving, but still distended. Had some loose stool this am. Had NGT placed. Gen surg and GI on board. Denies any chest pain, SOB, fever/chills.  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)   Personal history of breast cancer   Hypertension   Malignant neoplasm of ascending colon s/p robotic colectomy 03/06/2016   CKD (chronic kidney disease), stage III (HCC)   Large bowel obstruction (HCC)   Acute lower UTI  Large bowel obstruction  CT abd/pelvis showed thickening involving a short segment of rectosigmoid colon causing obstruction  Repeat Abd xray with no sig change Surgery on board: Consulted GI to assist with sigmoidoscopy to attempt colonic decompression GI on board: For sigmoidoscopy on 06/12/18 Continue NGT, bowel-rest, IVF hydration, analgesia, antiemetics  UTI  No fever or leukocytosis  UA with mod leukocytes, WBC 11-20  UC pending collection Continue IV Rocephin  History of colon/lung/breast cancer CT C/A/P last month with no concerning findings for disease progression per oncology  notes Oncology outpatient follow up  Hypertension  Hydralazine IV prn for now while NPO  Hold home lisinopril  CKD stage III  Cr at baseline     Code Status: Full  Family Communication: None at bedside  Disposition Plan: To be determined    Consultants:  Gen surg  GI   Procedures:  Sigmoidoscopy   Antimicrobials:  IV ceftriaxone   DVT prophylaxis: Heparin [name]   Objective: Vitals:   06/12/18 0241 06/12/18 0500 06/12/18 0537 06/12/18 1250  BP: 139/60  (!) 144/68 (!) 164/58  Pulse: 73  83 70  Resp:   16 16  Temp:   98.5 F (36.9 C) 98.3 F (36.8 C)  TempSrc:   Oral Oral  SpO2:   95% 98%  Weight:  63.5 kg  63.5 kg  Height:    5' 6.5" (1.689 m)    Intake/Output Summary (Last 24 hours) at 06/12/2018 1428 Last data filed at 06/12/2018 1342 Gross per 24 hour  Intake 1916.67 ml  Output 300 ml  Net 1616.67 ml   Filed Weights   06/12/18 0500 06/12/18 1250  Weight: 63.5 kg 63.5 kg    Exam:   General: NAD  Cardiovascular: S1, S2 present  Respiratory: CTAB  Abdomen: Tense, distended, mildly generalized tenderness, tympanic bowel sounds  Musculoskeletal: No pedal edema bilaterally  Skin: Normal  Psychiatry: Normal mood   Data Reviewed: CBC: Recent Labs  Lab 06/11/18 1856 06/12/18 0418  WBC 8.9 6.7  NEUTROABS 6.8  --   HGB 11.9* 10.0*  HCT 37.3 31.7*  MCV 91.9 92.2  PLT 522* 400*   Basic Metabolic Panel: Recent Labs  Lab 06/11/18 1856 06/12/18 0418  NA 138 142  K 4.6 5.1  CL 108 114*  CO2 23 23  GLUCOSE 146* 114*  BUN 27* 25*  CREATININE 1.51* 1.32*  CALCIUM 9.5 9.1   GFR: Estimated Creatinine Clearance: 33 mL/min (A) (by C-G formula based on SCr of 1.32 mg/dL (H)). Liver Function Tests: Recent Labs  Lab 06/11/18 1856  AST 14*  ALT 11  ALKPHOS 77  BILITOT 0.3  PROT 7.0  ALBUMIN 3.2*   Recent Labs  Lab 06/11/18 1856  LIPASE 55*   No results for input(s): AMMONIA in the last 168 hours. Coagulation  Profile: No results for input(s): INR, PROTIME in the last 168 hours. Cardiac Enzymes: No results for input(s): CKTOTAL, CKMB, CKMBINDEX, TROPONINI in the last 168 hours. BNP (last 3 results) No results for input(s): PROBNP in the last 8760 hours. HbA1C: No results for input(s): HGBA1C in the last 72 hours. CBG: No results for input(s): GLUCAP in the last 168 hours. Lipid Profile: No results for input(s): CHOL, HDL, LDLCALC, TRIG, CHOLHDL, LDLDIRECT in the last 72 hours. Thyroid Function Tests: No results for input(s): TSH, T4TOTAL, FREET4, T3FREE, THYROIDAB in the last 72 hours. Anemia Panel: No results for input(s): VITAMINB12, FOLATE, FERRITIN, TIBC, IRON, RETICCTPCT in the last 72 hours. Urine analysis:    Component Value Date/Time   COLORURINE YELLOW 06/11/2018 1801   APPEARANCEUR CLEAR 06/11/2018 1801   LABSPEC 1.024 06/11/2018 1801   PHURINE 5.0 06/11/2018 1801   GLUCOSEU NEGATIVE 06/11/2018 1801   HGBUR NEGATIVE 06/11/2018 1801   BILIRUBINUR NEGATIVE 06/11/2018 1801   KETONESUR NEGATIVE 06/11/2018 1801   PROTEINUR NEGATIVE 06/11/2018 1801   UROBILINOGEN 0.2 04/29/2013 2243   NITRITE NEGATIVE 06/11/2018 1801   LEUKOCYTESUR MODERATE (A) 06/11/2018 1801   Sepsis Labs: @LABRCNTIP (procalcitonin:4,lacticidven:4)  )No results found for this or any previous visit (from the past 240 hour(s)).    Studies: Dg Abd 1 View  Result Date: 06/12/2018 CLINICAL DATA:  Nasogastric tube placement. EXAM: ABDOMEN - 1 VIEW COMPARISON:  Radiographs earlier the same date.  CT 06/11/2018. FINDINGS: Enteric tube is visualized within the midline of the upper abdomen, consistent with location in the mid stomach as correlated with recent CT. There is a moderate size hiatal hernia. There is persistent marked dilatation of the transverse colon status post right hemicolectomy. The visualized small bowel also appears mildly dilated. No supine evidence of free intraperitoneal air. Previous left total  hip arthroplasty noted. IMPRESSION: Enteric tube extends to the level of the mid stomach. Persistent findings consistent with distal colonic obstruction. Electronically Signed   By: Richardean Sale M.D.   On: 06/12/2018 13:01   Ct Abdomen Pelvis W Contrast  Result Date: 06/11/2018 CLINICAL DATA:  Persistent lower abdominal pain and pressure. History of right colectomy for ascending colon cancer. EXAM: CT ABDOMEN AND PELVIS WITH CONTRAST TECHNIQUE: Multidetector CT imaging of the abdomen and pelvis was performed using the standard protocol following bolus administration of intravenous contrast. CONTRAST:  51mL ISOVUE-300 IOPAMIDOL (ISOVUE-300) INJECTION 61% COMPARISON:  05/04/2018 chest CT FINDINGS: Lower chest: Normal heart size. No pericardial effusion. Bibasilar dependent atelectasis. Moderate-sized hiatal hernia. Hepatobiliary: Stable 2.6 cm cyst in the right hepatic lobe. No enhancing mass lesions of the liver. Physiologic distention of the gallbladder without mural thickening or pericholecystic fluid. No biliary dilatation. Pancreas: No ductal dilatation, mass inflammation.  Mild atrophy. Spleen: No splenomegaly or mass. Adrenals/Urinary Tract: Normal bilateral adrenal glands. Symmetric cortical enhancement of both kidneys with small interpolar cyst in the right kidney unchanged in appearance measuring approximately 4 mm.  No nephrolithiasis nor hydroureteronephrosis. Urinary bladder is physiologically distended without focal mural thickening or calculi. Stomach/Bowel: Moderate-sized hiatal hernia. Nondistended stomach. Diffuse fluid-filled distended small and large bowel loops are identified to the level of the rectosigmoid where there is segmental transmural thickening noted causing large and small bowel obstruction. Dilatation of the colon to 9 cm is noted. The patient is status post right colectomy. Although findings may be secondary to a severe colitis, neoplasm is of concern. Vascular/Lymphatic:  Aortoiliac atherosclerosis without aneurysm. No adenopathy. Reproductive: Fibroid uterus.  No adnexal mass. Other: No free air nor free fluid. Musculoskeletal: Degenerative changes are noted along the dorsal spine without acute nor aggressive osseous lesions. IMPRESSION: 1. Short segmental thickening in the rectosigmoid causing large and small-bowel obstruction. Clinical correlation to exclude neoplasm is recommended. Marked inflammatory thickening is a differential possibility. Dilatation of fluid-filled large bowel is noted to 9 cm. 2. Status post right partial colectomy.  Intact staple line. 3. Stable 2.6 cm in the right hepatic lobe compatible with a cyst. 4. Moderate-sized hiatal hernia. Electronically Signed   By: Ashley Royalty M.D.   On: 06/11/2018 20:49   Dg Abd Portable 1v  Result Date: 06/12/2018 CLINICAL DATA:  Abdominal pain and diarrhea EXAM: PORTABLE ABDOMEN - 1 VIEW COMPARISON:  CT abdomen and pelvis June 11, 2018 FINDINGS: There is dilatation of most of the colon to the level of the mid sigmoid colon where there is abrupt termination of the contrast column. Small bowel loops are largely fluid-filled. No air-fluid levels. No appreciable free air. There are uterine calcifications consistent with leiomyomatous change. Bones are osteoporotic. There is a total hip replacement on the left. IMPRESSION: Findings indicative of distal colonic obstruction. Most small bowel loops are fluid filled. There is colonic distention proximal to the sigmoid colon, similar to 1 day prior. No free air is appreciable. 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 Total hip replacement on the left. Electronically Signed   By: Lowella Grip III M.D.   On: 06/12/2018 09:15    Scheduled Meds: . [MAR Hold] brimonidine  1 drop Right Eye Q12H   And  . [MAR Hold] timolol  1 drop Right Eye Q12H  . [MAR Hold] cycloSPORINE  1 drop Both Eyes BID  . [MAR Hold] heparin  5,000 Units Subcutaneous Q8H     Continuous Infusions: . sodium chloride 75 mL/hr at 06/12/18 1100  . [MAR Hold] cefTRIAXone (ROCEPHIN)  IV Stopped (06/12/18 0007)  . [MAR Hold] famotidine (PEPCID) IV 20 mg (06/12/18 0924)     LOS: 1 day     Alma Friendly, MD [facility]   If 7PM-7AM, please contact night-coverage [email or web address] Password [facility] 06/12/2018, 2:28 PM

## 2018-06-12 NOTE — Consult Note (Signed)
Consultation  Referring Provider:   Dr. Horris Latino Primary Care Physician:  Eber Hong, MD Primary Gastroenterologist:   Dr. Havery Moros      Reason for Consultation:   Small and large bowel obstruction, abnormal CT of the abdomen         HPI:   Kristin [name] is a 77 y.o. female with a past medical history as listed below including CVA on Plavix, history of breast cancer, history of lung cancer on chemotherapy and history of colon cancer in 2017 status post right hemicolectomy who presented to the ER on 06/11/2018 with a complaint of abdominal pain nausea and vomiting.  We were consulted by the surgical team regarding a small and large bowel obstruction and abnormal CT of the abdomen to possibly consider stent.    Today, patient found laying in bed with her family by her bedside, daughter does assist with history.  They explain that the patient had a large amount of loose stool over the weekend which continued into Tuesday which is somewhat normal for her on her chemotherapy regimen, so her daughter gave her some Imodium, patient then did not have a bowel movement for about 2 days and started again with diarrhea this morning around 3:00 and then later this morning around 630.  This was quite a large amount of stool..  Over this time the patient has been severely nauseous and was on a limited diet due to episodes of vomiting.  Also experiencing bloating and increased abdominal distention with pain radiating from her right lower quadrant across her abdomen.     Family does report they went to the ER in their hometown earlier this week and patient received fluids and they thought she just had a "virus".    Denies fever, chills or any vomiting since 06/09/2018.  ED course: CT abdomen pelvis with contrast showed short segmental thickening in the rectosigmoid causing large and small bowel obstruction, marked inflammatory thickening was also a possibility, dilation of fluid-filled large bowel was noted to  9 cm, status post right partial colectomy, stable 2.6 cm in the right hepatic lobe compatible with a cyst and moderate sized hiatal hernia  Previous GI work-up: 01/08/2016 EGD, Dr. Havery Moros: 9 cm hiatal hernia and otherwise normal to the second portion of the duodenum  03/03/2017 office visit Dr. Havery Moros: At that time was arranged for repeat colonoscopy 04/25/2017 colonoscopy, Dr. Havery Moros: Done for personal history of colon cancer 1 year ago; pression: Patent end-to-end colocolonic anastomosis, characterized by healthy-appearing mucosa, 2 8-10 mm polyps in the transverse colon, 1 7 mm polyp in the descending colon, diverticulosis in the left colon, internal hemorrhoids and otherwise normal; pathology sessile serrated polyp without dysplasia  Past Medical History:  Diagnosis Date  . Allergy   . Anemia   . Anemia of chronic disease 09/21/2016  . Anxiety   . Arthritis    arthritis- left hip  . Blood transfusion without reported diagnosis    transfusion- 3-4 yrs ago -found to be anemic on routine lab check  . Breast cancer ([place]) 2002   bilateral- bilateral mastectomies done.  . 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 CVA (cerebral infarction) 04/30/2013  . Encounter for antineoplastic chemotherapy 10/28/2016  . Family history of brain cancer   . Family history of breast cancer   . Family history of colon cancer 10/06/2009  . Family history of colon cancer   . Family history of prostate cancer   . GERD (gastroesophageal reflux disease)   . Glaucoma   .  History of bilateral mastectomy 05/20/2010  . Hypertension   . Lung cancer ([place]) dx'd 12/2010   last Ct scan "no lung cancer" showing 11'16 CT Chest Epic.  . Malignant neoplasm of ascending colon ([place]) 01/29/2016  . Stroke [facility])    2 yrs ago-no residual  . Tubular adenoma of colon 07/2011   colon polyps ans reoccurence with malignancy found    Past Surgical History:  Procedure Laterality Date  . APPENDECTOMY    . cataracts Bilateral   .  COLONOSCOPY    . EYE SURGERY    . LUNG SURGERY     Resection -" not done. Pt . tx with Tarceva  . MASTECTOMY Bilateral   . MEDIASTINOSCOPY N/A 10/02/2016   Procedure: MEDIASTINOSCOPY;  Surgeon: [name], MD;  [facility];  Service: Thoracic;  Laterality: N/A;  . RECONSTRUCTION BREAST W/ TRAM FLAP Bilateral    bilaterally  . robotic right hemicolectomy Right 03/06/2016   Dr. Johney Maine  . TEE WITHOUT CARDIOVERSION N/A 06/04/2013   Procedure: TRANSESOPHAGEAL ECHOCARDIOGRAM (TEE);  Surgeon: Jolaine Artist, MD;  Location: [facility] ENDOSCOPY;  Service: Cardiovascular;  Laterality: N/A;  . TONSILLECTOMY    . TOTAL HIP ARTHROPLASTY Left 08/07/2016   Procedure: LEFT TOTAL HIP ARTHROPLASTY ANTERIOR APPROACH;  Surgeon: Gaynelle Arabian, MD;  Location: [facility] ORS;  Service: Orthopedics;  Laterality: Left;    Family History  Problem Relation Age of Onset  . Lung cancer Father   . Heart disease Mother   . Clotting disorder Mother 43       coronary thrombosis  . Colon cancer Sister        dx in her 46s  . Breast cancer Sister 16  . Multiple myeloma Paternal Grandmother   . Brain cancer Paternal Uncle   . Cancer Paternal Aunt        unknown  . Colon cancer Maternal Aunt 80  . Prostate cancer Maternal Uncle        dx in his late 27s  . Other Maternal Uncle        brain tumor dx in his 33s  . Lung cancer Maternal Uncle   . Cancer Maternal Uncle        NOS  . Stomach cancer Paternal Aunt   . Brain cancer Cousin        maternal first cousin dx in late 52s-50s  . Cancer Paternal Uncle        NOS  . Colon cancer Cousin        paternal cousin dx in his 33s  . Cancer Cousin        paternal cousin dx with NOS cancer   Social History   Tobacco Use  . Smoking status: Never Smoker  . Smokeless tobacco: Never Used  Substance Use Topics  . Alcohol use: Yes    Alcohol/week: 0.0 standard drinks    Comment: ocassional beer  . Drug use: No    Prior to Admission medications   Medication  Sig Start Date End Date Taking? Authorizing Provider  alendronate (FOSAMAX) 70 MG tablet Take 70 mg once a week by mouth. 08/20/17  Yes [provider]  amLODipine (NORVASC) 5 MG tablet Take 5 mg by mouth daily. Per Pt -she takes 2.5 mg in am and 5 mg in pm   Yes [provider]  brimonidine-timolol (COMBIGAN) 0.2-0.5 % ophthalmic solution Place 1 drop into the right eye every 12 (twelve) hours.   Yes [provider]  cephALEXin (KEFLEX) 250  MG capsule Take 250 mg by mouth 4 (four) times daily. 06/10/18  Yes [provider]  Cholecalciferol (VITAMIN D3) 2000 units capsule Take 2,000 Units daily by mouth.   Yes [provider]  clopidogrel (PLAVIX) 75 MG tablet Take 75 mg by mouth daily. 06/25/16  Yes [provider]  cycloSPORINE (RESTASIS) 0.05 % ophthalmic emulsion Place 1 drop into both eyes 2 (two) times daily.   Yes [provider]  escitalopram (LEXAPRO) 10 MG tablet Take 10 mg by mouth daily.  05/20/13  Yes Philmore Pali, NP  famotidine (PEPCID) 40 MG tablet Take 40 mg by mouth daily.   Yes [provider]  letrozole ([place]) 2.5 MG tablet [place] Patient taking differently: Take 2.5 mg by mouth daily.  09/01/17  Yes Curt Bears, MD  lisinopril (PRINIVIL,ZESTRIL) 20 MG tablet Take 1 tablet by mouth 2 (two) times daily. 12/13/16  Yes [provider]  ondansetron (ZOFRAN-ODT) 4 MG disintegrating tablet Take 4 mg by mouth every 8 (eight) hours as needed for nausea. 06/09/18  Yes [provider]  osimertinib mesylate (TAGRISSO) 80 MG tablet Take 1 tablet (80 mg total) by mouth daily. 01/26/18  Yes Curt Bears, MD  FeFum-FePoly-FA-B Cmp-C-[facility]) CAPS TAKE ONE CAPSULE BY MOUTH EACH DAY Patient not taking: Reported on 06/11/2018 06/12/17   Curt Bears, MD    Current Facility-Administered Medications  Medication Dose Route Frequency Provider Last Rate Last Dose  . 0.9 %   sodium chloride infusion   Intravenous Continuous Opyd, Ilene Qua, MD 100 mL/hr at 06/12/18 0601    . acetaminophen (TYLENOL) tablet 650 mg  650 mg Oral Q6H PRN Opyd, Ilene Qua, MD       Or  . acetaminophen (TYLENOL) suppository 650 mg  650 mg Rectal Q6H PRN Opyd, Ilene Qua, MD      . brimonidine (ALPHAGAN) 0.2 % ophthalmic solution 1 drop  1 drop Right Eye Q12H Opyd, Ilene Qua, MD       And  . timolol (TIMOPTIC) 0.5 % ophthalmic solution 1 drop  1 drop Right Eye Q12H Opyd, Ilene Qua, MD   1 drop at 06/12/18 0920  . cefTRIAXone (ROCEPHIN) 1 g in sodium chloride 0.9 % 100 mL IVPB  1 g Intravenous QHS Opyd, Ilene Qua, MD   Stopped at 06/12/18 0007  . cycloSPORINE (RESTASIS) 0.05 % ophthalmic emulsion 1 drop  1 drop Both Eyes BID Opyd, Ilene Qua, MD   1 drop at 06/11/18 2301  . famotidine (PEPCID) IVPB 20 mg premix  20 mg Intravenous Q12H Opyd, Ilene Qua, MD   Stopped at 06/11/18 2326  . fentaNYL (SUBLIMAZE) injection 25-50 mcg  25-50 mcg Intravenous Q2H PRN Opyd, Ilene Qua, MD   25 mcg at 06/12/18 0806  . heparin injection 5,000 Units  5,000 Units Subcutaneous Q8H Vianne Bulls, MD   5,000 Units at 06/12/18 0606  . hydrALAZINE (APRESOLINE) injection 10 mg  10 mg Intravenous Q4H PRN Opyd, Ilene Qua, MD      . ondansetron (ZOFRAN) injection 4 mg  4 mg Intravenous Q6H PRN Opyd, Ilene Qua, MD   4 mg at 06/12/18 0745  . promethazine (PHENERGAN) tablet 12.5 mg  12.5 mg Oral Q6H PRN Opyd, Ilene Qua, MD        Allergies as of 06/11/2018 - Review Complete 06/11/2018  Allergen Reaction Noted  . Doxycycline Nausea Only 08/30/2015  . Sulfa antibiotics Rash 01/10/2010     Review of Systems:  Constitutional: No weight loss, fever or chills Skin: No rash  Cardiovascular: No chest pain Respiratory: No SOB Gastrointestinal: See HPI and otherwise negative Genitourinary: No dysuria Neurological: No headache, dizziness or syncope Musculoskeletal: No new muscle or joint pain Hematologic: No bleeding    Psychiatric: No history of depression or anxiety    Physical Exam:  Vital signs in last 24 hours: Temp:  [97.6 F (36.4 C)-98.9 F (37.2 C)] 98.5 F (36.9 C) (08/23 0537) Pulse Rate:  [66-101] 83 (08/23 0537) Resp:  [15-27] 16 (08/23 0537) BP: (119-162)/(56-68) 144/68 (08/23 0537) SpO2:  [95 %-100 %] 95 % (08/23 0537) Weight:  [63.5 kg] 63.5 kg (08/23 0500) Last BM Date: 06/07/18 General:   Pleasant Elderly Caucasian female appears to be in NAD, Well developed, Well nourished, alert and cooperative Head:  Normocephalic and atraumatic. Eyes:   PEERL, EOMI. No icterus. Conjunctiva pink. Ears:  Normal auditory acuity. Neck:  Supple Throat: Oral cavity and pharynx without inflammation, swelling or lesion.  Lungs: Respirations even and unlabored. Lungs clear to auscultation bilaterally.   No wheezes, crackles, or rhonchi.  Heart: Normal S1, S2. No MRG. Regular rate and rhythm. No peripheral edema, cyanosis or pallor.  Abdomen: Tense, markedly distended, moderately tender, worse in bilateral lower quadrants, hypertympanic bowel sounds, no appreciable masses or hepatomegaly. Rectal:  Not performed.  Msk:  Symmetrical without gross deformities. Peripheral pulses intact.  Extremities:  Without edema, no deformity or joint abnormality.  Neurologic:  Alert and  oriented x4;  grossly normal neurologically.  Skin:   Dry and intact without significant lesions or rashes. Psychiatric: Demonstrates good judgement and reason without abnormal affect or behaviors.   LAB RESULTS: Recent Labs    06/11/18 1856 06/12/18 0418  WBC 8.9 6.7  HGB 11.9* 10.0*  HCT 37.3 31.7*  PLT 522* 468*   BMET Recent Labs    06/11/18 1856 06/12/18 0418  NA 138 142  K 4.6 5.1  CL 108 114*  CO2 23 23  GLUCOSE 146* 114*  BUN 27* 25*  CREATININE 1.51* 1.32*  CALCIUM 9.5 9.1   LFT Recent Labs    06/11/18 1856  PROT 7.0  ALBUMIN 3.2*  AST 14*  ALT 11  ALKPHOS 77  BILITOT 0.3   STUDIES: Ct Abdomen  Pelvis W Contrast  Result Date: 06/11/2018 CLINICAL DATA:  Persistent lower abdominal pain and pressure. History of right colectomy for ascending colon cancer. EXAM: CT ABDOMEN AND PELVIS WITH CONTRAST TECHNIQUE: Multidetector CT imaging of the abdomen and pelvis was performed using the standard protocol following bolus administration of intravenous contrast. CONTRAST:  66m ISOVUE-300 IOPAMIDOL (ISOVUE-300) INJECTION 61% COMPARISON:  05/04/2018 chest CT FINDINGS: Lower chest: Normal heart size. No pericardial effusion. Bibasilar dependent atelectasis. Moderate-sized hiatal hernia. Hepatobiliary: Stable 2.6 cm cyst in the right hepatic lobe. No enhancing mass lesions of the liver. Physiologic distention of the gallbladder without mural thickening or pericholecystic fluid. No biliary dilatation. Pancreas: No ductal dilatation, mass inflammation.  Mild atrophy. Spleen: No splenomegaly or mass. Adrenals/Urinary Tract: Normal bilateral adrenal glands. Symmetric cortical enhancement of both kidneys with small interpolar cyst in the right kidney unchanged in appearance measuring approximately 4 mm. No nephrolithiasis nor hydroureteronephrosis. Urinary bladder is physiologically distended without focal mural thickening or calculi. Stomach/Bowel: Moderate-sized hiatal hernia. Nondistended stomach. Diffuse fluid-filled distended small and large bowel loops are identified to the level of the rectosigmoid where there is segmental transmural thickening noted causing large and small bowel obstruction. Dilatation of the colon to 9 cm  is noted. The patient is status post right colectomy. Although findings may be secondary to a severe colitis, neoplasm is of concern. Vascular/Lymphatic: Aortoiliac atherosclerosis without aneurysm. No adenopathy. Reproductive: Fibroid uterus.  No adnexal mass. Other: No free air nor free fluid. Musculoskeletal: Degenerative changes are noted along the dorsal spine without acute nor aggressive  osseous lesions. IMPRESSION: 1. Short segmental thickening in the rectosigmoid causing large and small-bowel obstruction. Clinical correlation to exclude neoplasm is recommended. Marked inflammatory thickening is a differential possibility. Dilatation of fluid-filled large bowel is noted to 9 cm. 2. Status post right partial colectomy.  Intact staple line. 3. Stable 2.6 cm in the right hepatic lobe compatible with a cyst. 4. Moderate-sized hiatal hernia. Electronically Signed   By: Ashley Royalty M.D.   On: 06/11/2018 20:49   Dg Abd Portable 1v  Result Date: 06/12/2018 CLINICAL DATA:  Abdominal pain and diarrhea EXAM: PORTABLE ABDOMEN - 1 VIEW COMPARISON:  CT abdomen and pelvis June 11, 2018 FINDINGS: There is dilatation of most of the colon to the level of the mid sigmoid colon where there is abrupt termination of the contrast column. Small bowel loops are largely fluid-filled. No air-fluid levels. No appreciable free air. There are uterine calcifications consistent with leiomyomatous change. Bones are osteoporotic. There is a total hip replacement on the left. IMPRESSION: Findings indicative of distal colonic obstruction. Most small bowel loops are fluid filled. There is colonic distention proximal to the sigmoid colon, similar to 1 day prior. No free air is appreciable. 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 Total hip replacement on the left. Electronically Signed   By: Lowella Grip III M.D.   On: 06/12/2018 09:15    Impression / Plan:   Impression: 1.  Bowel obstruction: Large bowel obstruction, repeat x-ray this morning with no change overnight, NG tube being inserted this a.m., history of diverticular disease and right hemicolectomy, recent colonoscopy a year ago for follow-up of colon cancer with only a few polyps; do not believe this is repeat neoplasm, more likely inflammation from diverticular disease 2.  UTI: Patient started on Rocephin 3.  History of colon cancer:  Adenocarcinoma 2017, underwent right hemicolectomy May 2017, repeat colonoscopy in July 2018 with polyps in the transverse and descending colon 4.  Lung cancer: Following with oncology and managed with Tagrisso 5.  History of breast cancer: Managed with Femara 6.  CKD stage III  Plan: 1.  Agree with NG tube to wall with low intermittent suction today 2.  Discussed case with Dr. Rush Landmark who will discuss stent and decompression with Dr. Kae Heller with surgery personally later this morning 3.  At this time discussed with family that we would rather not pursue colonoscopic decompression due to risk of perforation, but will let them know if plans change 4.  Patient to remain n.p.o. 5.  Please await any further recommendations from Dr. Rush Landmark later today.  Thank you for your kind consultation, we will continue to follow.  Lavone Nian Gerrie Castiglia  06/12/2018, 9:22 AM

## 2018-06-12 NOTE — Consult Note (Signed)
[facility] Surgery Consult Note  Kristin [name] [birth year]  [id number].    Requesting MD: Horris Latino Chief Complaint/Reason for Consult: large bowel obstruction  HPI:  Patient is a 77 year old female who presented to [facility] ED with 5-6 day history of abdominal pain and distention. This has progressively worsened over the last several days. Patient describes pain as generalized and cramping. She initially had large volume "explosive" diarrhea and was seen in another ER at the beginning of the week. Was not given any specific diagnosis but told she had a UTI and given a prescription for keflex. She had not had any further diarrhea since Tuesday until last night, she had a small amount of watery stool. She is not passing any flatus. She feels nauseated, has not thrown up since Tuesday. Denies fevers or chills at home. Started a new medication 2-3 weeks ago but stopped it due to upset stomach, symptoms did not improve with stopping medication. 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 Colonoscopy in July 2018 with polyps and diverticulosis. Patient currently on chemotherapy for lung cancer and has a history of breast cancer as well. She is allergic to doxycycline and sulfa antibiotics. On plavix at home, this is being held in the hospital.   ROS: Review of Systems  Constitutional: Negative for chills and fever.  Respiratory: Negative for shortness of breath.   Cardiovascular: Negative for chest pain and palpitations.  Gastrointestinal: Positive for abdominal pain, constipation, diarrhea, nausea and vomiting. Negative for blood in stool and melena.  All other systems reviewed and are negative.   Family History  Problem Relation Age of Onset  . Lung cancer Father   . Heart disease Mother   . Clotting disorder Mother 46       coronary thrombosis  . Colon cancer Sister        dx in her 63s  . Breast cancer Sister 82  . Multiple myeloma Paternal  Grandmother   . Brain cancer Paternal Uncle   . Cancer Paternal Aunt        unknown  . Colon cancer Maternal Aunt 80  . Prostate cancer Maternal Uncle        dx in his late 58s  . Other Maternal Uncle        brain tumor dx in his 37s  . Lung cancer Maternal Uncle   . Cancer Maternal Uncle        NOS  . Stomach cancer Paternal Aunt   . Brain cancer Cousin        maternal first cousin dx in late 16s-50s  . Cancer Paternal Uncle        NOS  . Colon cancer Cousin        paternal cousin dx in his 34s  . Cancer Cousin        paternal cousin dx with NOS cancer    Past Medical History:  Diagnosis Date  . Allergy   . Anemia   . Anemia of chronic disease 09/21/2016  . Anxiety   . Arthritis    arthritis- left hip  . Blood transfusion without reported diagnosis    transfusion- 3-4 yrs ago -found to be anemic on routine lab check  . Breast cancer ([place]) 2002   bilateral- bilateral mastectomies done.  . 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 CVA (cerebral infarction) 04/30/2013  . Encounter for antineoplastic chemotherapy 10/28/2016  . Family history of brain cancer   . Family history of  breast cancer   . Family history of colon cancer 10/06/2009  . Family history of colon cancer   . Family history of prostate cancer   . GERD (gastroesophageal reflux disease)   . Glaucoma   . History of bilateral mastectomy 05/20/2010  . Hypertension   . Lung cancer ([place]) dx'd 12/2010   last Ct scan "no lung cancer" showing 11'16 CT Chest Epic.  . Malignant neoplasm of ascending colon ([place]) 01/29/2016  . Stroke [facility])    2 yrs ago-no residual  . Tubular adenoma of colon 07/2011   colon polyps ans reoccurence with malignancy found    Past Surgical History:  Procedure Laterality Date  . APPENDECTOMY    . cataracts Bilateral   . COLONOSCOPY    . EYE SURGERY    . LUNG SURGERY     Resection -" not done. Pt . tx with Tarceva  . MASTECTOMY Bilateral   . MEDIASTINOSCOPY N/A 10/02/2016   Procedure:  MEDIASTINOSCOPY;  Surgeon: [name], MD;  Location: [facility] OR;  Service: Thoracic;  Laterality: N/A;  . RECONSTRUCTION BREAST W/ TRAM FLAP Bilateral    bilaterally  . robotic right hemicolectomy Right 03/06/2016   Dr. Johney Maine  . TEE WITHOUT CARDIOVERSION N/A 06/04/2013   Procedure: TRANSESOPHAGEAL ECHOCARDIOGRAM (TEE);  Surgeon: Jolaine Artist, MD;  Location: [facility] ENDOSCOPY;  Service: Cardiovascular;  Laterality: N/A;  . TONSILLECTOMY    . TOTAL HIP ARTHROPLASTY Left 08/07/2016   Procedure: LEFT TOTAL HIP ARTHROPLASTY ANTERIOR APPROACH;  Surgeon: Gaynelle Arabian, MD;  Location: [facility] ORS;  Service: Orthopedics;  Laterality: Left;    Social History:  reports that she has never smoked. She has never used smokeless tobacco. She reports that she drinks alcohol. She reports that she does not use drugs.  Allergies:  Allergies  Allergen Reactions  . Doxycycline Nausea Only    Weak, stomach  . Sulfa Antibiotics Rash    Facility-Administered Medications Prior to Admission  Medication Dose Route Frequency Provider Last Rate Last Dose  . 0.9 %  sodium chloride infusion  500 mL Intravenous Continuous Armbruster, Carlota Raspberry, MD       Medications Prior to Admission  Medication Sig Dispense Refill  . alendronate (FOSAMAX) 70 MG tablet Take 70 mg once a week by mouth.    Marland Kitchen amLODipine (NORVASC) 5 MG tablet Take 5 mg by mouth daily. Per Pt -she takes 2.5 mg in am and 5 mg in pm    . brimonidine-timolol (COMBIGAN) 0.2-0.5 % ophthalmic solution Place 1 drop into the right eye every 12 (twelve) hours.    . cephALEXin (KEFLEX) 250 MG capsule Take 250 mg by mouth 4 (four) times daily.    . Cholecalciferol (VITAMIN D3) 2000 units capsule Take 2,000 Units daily by mouth.    . clopidogrel (PLAVIX) 75 MG tablet Take 75 mg by mouth daily.    . cycloSPORINE (RESTASIS) 0.05 % ophthalmic emulsion Place 1 drop into both eyes 2 (two) times daily.    Marland Kitchen escitalopram (LEXAPRO) 10 MG tablet Take 10 mg by mouth daily.      . famotidine (PEPCID) 40 MG tablet Take 40 mg by mouth daily.    Marland Kitchen letrozole (FEMARA) 2.5 MG tablet TAKE ONE TABLET EACH DAY (Patient taking differently: Take 2.5 mg by mouth daily. ) 30 tablet 3  . lisinopril (PRINIVIL,ZESTRIL) 20 MG tablet Take 1 tablet by mouth 2 (two) times daily.    . ondansetron (ZOFRAN-ODT) 4 MG disintegrating tablet Take 4 mg by mouth every  8 (eight) hours as needed for nausea.    Marland Kitchen osimertinib mesylate (TAGRISSO) 80 MG tablet Take 1 tablet (80 mg total) by mouth daily. 30 tablet 0  . FeFum-FePoly-FA-B Cmp-C-Biot (FOLIVANE-PLUS) CAPS TAKE ONE CAPSULE BY MOUTH EACH DAY (Patient not taking: Reported on 06/11/2018) 90 capsule 2    Blood pressure (!) 144/68, pulse 83, temperature 98.5 F (36.9 C), temperature source Oral, resp. rate 16, height 5' 6.5" (1.689 m), weight 63.5 kg, SpO2 95 %. Physical Exam: Physical Exam  Constitutional: She is oriented to person, place, and time. She appears well-developed. She appears cachectic. She is cooperative.  Non-toxic appearance. No distress.  HENT:  Head: Normocephalic and atraumatic.  Right Ear: External ear normal.  Left Ear: External ear normal.  Nose: Nose normal.  Mouth/Throat: Mucous membranes are normal.  Eyes: Conjunctivae, EOM and lids are normal. No scleral icterus.  Pupils equal and round  Neck: Normal range of motion and phonation normal. Neck supple.  Cardiovascular: Normal rate and regular rhythm.  Pulses:      Radial pulses are 2+ on the right side, and 2+ on the left side.       Dorsalis pedis pulses are 2+ on the right side, and 2+ on the left side.  No LE edema  Pulmonary/Chest: Effort normal. She has decreased breath sounds (mildly diminished bilaterally ).  Abdominal: She exhibits distension. There is no hepatosplenomegaly. There is generalized tenderness. There is no rigidity, no rebound and no guarding. No hernia.  High-pitched tinkling bowel sounds  Musculoskeletal:  ROM grossly intact in bilateral  upper and lower extremities  Neurological: She is alert and oriented to person, place, and time.  Skin: Skin is warm, dry and intact.  Psychiatric: She has a normal mood and affect. Her speech is normal and behavior is normal.    Results for orders placed or performed during the hospital encounter of 06/11/18 (from the past 48 hour(s))  Urinalysis, Routine w reflex microscopic- may I&O cath if menses     Status: Abnormal   Collection Time: 06/11/18  6:01 PM  Result Value Ref Range   Color, Urine YELLOW YELLOW   APPearance CLEAR CLEAR   Specific Gravity, Urine 1.024 1.005 - 1.030   pH 5.0 5.0 - 8.0   Glucose, UA NEGATIVE NEGATIVE mg/dL   Hgb urine dipstick NEGATIVE NEGATIVE   Bilirubin Urine NEGATIVE NEGATIVE   Ketones, ur NEGATIVE NEGATIVE mg/dL   Protein, ur NEGATIVE NEGATIVE mg/dL   Nitrite NEGATIVE NEGATIVE   Leukocytes, UA MODERATE (A) NEGATIVE   RBC / HPF 0-5 0 - 5 RBC/hpf   WBC, UA 11-20 0 - 5 WBC/hpf   Bacteria, UA RARE (A) NONE SEEN   Squamous Epithelial / LPF 0-5 0 - 5   Mucus PRESENT    Hyaline Casts, UA PRESENT     Comment: Performed at [facility], [place] [address] [place], [place] [address]  Lipase, blood     Status: Abnormal   Collection Time: 06/11/18  6:56 PM  Result Value Ref Range   Lipase 55 (H) 11 - 51 U/L    Comment: Performed at [facility], [place] [address] [place], [place] [address]  Comprehensive metabolic panel     Status: Abnormal   Collection Time: 06/11/18  6:56 PM  Result Value Ref Range   Sodium 138 135 - 145 mmol/L   Potassium 4.6 3.5 - 5.1 mmol/L   Chloride 108 98 - 111 mmol/L   CO2 23 22 -  32 mmol/L   Glucose, Bld 146 (H) 70 - 99 mg/dL   BUN 27 (H) 8 - 23 mg/dL   Creatinine, Ser 1.51 (H) 0.44 - 1.00 mg/dL   Calcium 9.5 8.9 - 10.3 mg/dL   Total Protein 7.0 6.5 - 8.1 g/dL   Albumin 3.2 (L) 3.5 - 5.0 g/dL   AST 14 (L) 15 - 41 U/L   ALT 11 0 - 44 U/L   Alkaline Phosphatase 77 38 - 126 U/L   Total  Bilirubin 0.3 0.3 - 1.2 mg/dL   GFR calc non Af Amer 32 (L) >60 mL/min   GFR calc Af Amer 37 (L) >60 mL/min    Comment: (NOTE) The eGFR has been calculated using the CKD EPI equation. This calculation has not been validated in all clinical situations. eGFR's persistently <60 mL/min signify possible Chronic Kidney Disease.    Anion gap 7 5 - 15    Comment: Performed at [facility], [place] [address] [place], [place] [address]  CBC     Status: Abnormal   Collection Time: 06/11/18  6:56 PM  Result Value Ref Range   WBC 8.9 4.0 - 10.5 K/uL   RBC 4.06 3.87 - 5.11 MIL/uL   Hemoglobin 11.9 (L) 12.0 - 15.0 g/dL   HCT 37.3 36.0 - 46.0 %   MCV 91.9 78.0 - 100.0 fL   MCH 29.3 26.0 - 34.0 pg   MCHC 31.9 30.0 - 36.0 g/dL   RDW 13.3 11.5 - 15.5 %   Platelets 522 (H) 150 - 400 K/uL    Comment: Performed at [facility], [place] [address], [place], [place] [address]  Differential     Status: Abnormal   Collection Time: 06/11/18  6:56 PM  Result Value Ref Range   Neutrophils Relative % 77 %   Neutro Abs 6.8 1.7 - 7.7 K/uL   Lymphocytes Relative 8 %   Lymphs Abs 0.7 0.7 - 4.0 K/uL   Monocytes Relative 15 %   Monocytes Absolute 1.3 (H) 0.1 - 1.0 K/uL   Eosinophils Relative 0 %   Eosinophils Absolute 0.0 0.0 - 0.7 K/uL   Basophils Relative 0 %   Basophils Absolute 0.0 0.0 - 0.1 K/uL    Comment: Performed at [facility], [place] [address], [place], [place] [address]  Basic metabolic panel     Status: Abnormal   Collection Time: 06/12/18  4:18 AM  Result Value Ref Range   Sodium 142 135 - 145 mmol/L   Potassium 5.1 3.5 - 5.1 mmol/L   Chloride 114 (H) 98 - 111 mmol/L   CO2 23 22 - 32 mmol/L   Glucose, Bld 114 (H) 70 - 99 mg/dL   BUN 25 (H) 8 - 23 mg/dL   Creatinine, Ser 1.32 (H) 0.44 - 1.00 mg/dL   Calcium 9.1 8.9 - 10.3 mg/dL   GFR calc non Af Amer 37 (L) >60 mL/min   GFR calc Af Amer 43 (L) >60 mL/min    Comment: (NOTE) The eGFR has  been calculated using the CKD EPI equation. This calculation has not been validated in all clinical situations. eGFR's persistently <60 mL/min signify possible Chronic Kidney Disease.    Anion gap 5 5 - 15    Comment: Performed at [facility], [place] [address], [place], [place] [address]  CBC     Status: Abnormal   Collection Time: 06/12/18  4:18 AM  Result Value Ref Range   WBC  6.7 4.0 - 10.5 K/uL   RBC 3.44 (L) 3.87 - 5.11 MIL/uL   Hemoglobin 10.0 (L) 12.0 - 15.0 g/dL   HCT 31.7 (L) 36.0 - 46.0 %   MCV 92.2 78.0 - 100.0 fL   MCH 29.1 26.0 - 34.0 pg   MCHC 31.5 30.0 - 36.0 g/dL   RDW 13.4 11.5 - 15.5 %   Platelets 468 (H) 150 - 400 K/uL    Comment: Performed at [facility], [place] [address] [place], [place] [address]   Ct Abdomen Pelvis W Contrast  Result Date: 06/11/2018 CLINICAL DATA:  Persistent lower abdominal pain and pressure. History of right colectomy for ascending colon cancer. EXAM: CT ABDOMEN AND PELVIS WITH CONTRAST TECHNIQUE: Multidetector CT imaging of the abdomen and pelvis was performed using the standard protocol following bolus administration of intravenous contrast. CONTRAST:  54m ISOVUE-300 IOPAMIDOL (ISOVUE-300) INJECTION 61% COMPARISON:  05/04/2018 chest CT FINDINGS: Lower chest: Normal heart size. No pericardial effusion. Bibasilar dependent atelectasis. Moderate-sized hiatal hernia. Hepatobiliary: Stable 2.6 cm cyst in the right hepatic lobe. No enhancing mass lesions of the liver. Physiologic distention of the gallbladder without mural thickening or pericholecystic fluid. No biliary dilatation. Pancreas: No ductal dilatation, mass inflammation.  Mild atrophy. Spleen: No splenomegaly or mass. Adrenals/Urinary Tract: Normal bilateral adrenal glands. Symmetric cortical enhancement of both kidneys with small interpolar cyst in the right kidney unchanged in appearance measuring approximately 4 mm. No nephrolithiasis nor  hydroureteronephrosis. Urinary bladder is physiologically distended without focal mural thickening or calculi. Stomach/Bowel: Moderate-sized hiatal hernia. Nondistended stomach. Diffuse fluid-filled distended small and large bowel loops are identified to the level of the rectosigmoid where there is segmental transmural thickening noted causing large and small bowel obstruction. Dilatation of the colon to 9 cm is noted. The patient is status post right colectomy. Although findings may be secondary to a severe colitis, neoplasm is of concern. Vascular/Lymphatic: Aortoiliac atherosclerosis without aneurysm. No adenopathy. Reproductive: Fibroid uterus.  No adnexal mass. Other: No free air nor free fluid. Musculoskeletal: Degenerative changes are noted along the dorsal spine without acute nor aggressive osseous lesions. IMPRESSION: 1. Short segmental thickening in the rectosigmoid causing large and small-bowel obstruction. Clinical correlation to exclude neoplasm is recommended. Marked inflammatory thickening is a differential possibility. Dilatation of fluid-filled large bowel is noted to 9 cm. 2. Status post right partial colectomy.  Intact staple line. 3. Stable 2.6 cm in the right hepatic lobe compatible with a cyst. 4. Moderate-sized hiatal hernia. Electronically Signed   By: DAshley RoyaltyM.D.   On: 06/11/2018 20:49      Assessment/Plan Hx of CVA - on plavix at home, held currently HTN CKD stage III Breast cancer Lung cancer - followed by Dr. MJulien Nordmann on oral chemotherapy  Hx of robotic R hemicolectomy - 03/06/26 Dr. GJohney MaineRecent UTI - UA with mod leukocytes, cx pending Anemia of chronic disease - H/H 10.0/31.7 Hiatal hernia Depression/Anxiety   Large bowel obstruction  - on exam abdomen is quite distended with high-pitched bowel sounds - I do not think she has perforated her colon but am concerned that she may be continuing to dilate - repeat abdominal film this AM - I have consulted GI  - may  need NGT decompression  - no acute surgical intervention warranted at this time but we will continue to follow closely   FEN: NPO w/ ice chips, IVF VTE: SCDs, SQ heparin  ID: Rocephin 8/22>>  KBrigid [facility] 06/12/2018, 8:08 AM Pager:  [phone] Consults: [phone] Mon-Fri 7:00 am-4:30 pm Sat-Sun 7:00 am-11:30 am

## 2018-06-12 NOTE — Transfer of Care (Signed)
Immediate Anesthesia Transfer of Care Note  Patient: Kristin [name]  Procedure(s) Performed: Procedure(s): FLEXIBLE SIGMOIDOSCOPY (N/A) BOWEL DECOMPRESSION (N/A)  Patient Location: Endoscopy Unit  Anesthesia Type:MAC  Level of Consciousness:  sedated, patient cooperative and responds to stimulation  Airway & Oxygen Therapy:Patient Spontanous Breathing and Patient connected to face mask oxgen  Post-op Assessment:  Report given to PACU RN and Post -op Vital signs reviewed and stable  Post vital signs:  Reviewed and stable  Last Vitals:  Vitals:   06/12/18 0537 06/12/18 1250  BP: (!) 144/68 (!) 164/58  Pulse: 83 70  Resp: 16 16  Temp: 36.9 C 36.8 C  SpO2: 54% 09%    Complications: No apparent anesthesia complications

## 2018-06-12 NOTE — Op Note (Signed)
[facility] Patient Name: Kristin [name] Procedure Date: 06/12/2018 MRN: [id number] Attending MD: Justice Britain , MD Date of Birth: 03/06/1941 CSN: [id number] Age: 77 Admit Type: Inpatient Procedure:                Flexible Sigmoidoscopy Indications:              Personal history of malignant neoplasm of the                            colon, Abnormal CT of the GI tract, Preoperative                            assessment, Suspected colonic obstruction, For                            therapy of colonic obstruction Providers:                Justice Britain, MD, Truddie Coco, RN, Charolette Child, Technician Referring MD:             Dr. Windle Guard (Surgery), Dr. Horris Latino ([facility]) Medicines:                Monitored Anesthesia Care Complications:            No immediate complications. Estimated Blood Loss:     Estimated blood loss was minimal. Procedure:                Pre-Anesthesia Assessment:                           - Prior to the procedure, a History and Physical                            was performed, and patient medications and                            allergies were reviewed. The patient's tolerance of                            previous anesthesia was also reviewed. The risks                            and benefits of the procedure and the sedation                            options and risks were discussed with the patient.                            All questions were answered, and informed consent                            was obtained. Prior Anticoagulants: The patient has  taken Plavix (clopidogrel), last dose was 1 day                            prior to procedure. ASA Grade Assessment: IV - A                            patient with severe systemic disease that is a                            constant threat to life. After reviewing the risks                            and benefits, the patient was  deemed in                            satisfactory condition to undergo the procedure.                           After obtaining informed consent, the scope was                            passed under direct vision. The [id number]-H190 ([id number])                            Olympus adult endoscope was introduced through the                            anus and advanced to the the sigmoid colon. The                            flexible sigmoidoscopy was technically difficult                            and complex due to bowel stenosis. Successful                            completion of the procedure was aided by changing                            endoscopes and performing the maneuvers documented                            (below) in this report. The single channel                            therapeutic endoscope was then used for placement                            of the decompression tube. Scope In: Scope Out: Findings:      The digital rectal exam findings include non-thrombosed external       hemorrhoids. Pertinent negatives include no palpable rectal lesions.      A benign-appearing, intrinsic severe stenosis measuring approximately 5  cm (in length) x 4 mm (inner diameter) was found in the sigmoid colon       and was non-traversed (this is a fluorscopic estimate). Placement of a       long 3.335 inch hydrophilic wire was attempted. This passed successfully       under fluoroscopic guidance. To ensure that a perforation had not       occured, using a retrieval above-balloon, this was passed through the       stricture and then the area was successfully injected with diluted       contrast for to assess stricture length and to see that the wire was in       the colonic lumen. At the completion of the wire placement, a colonic       79F decompression tube (this is the largest tube that we have available       at [facility]) was placed over the wire under fluoroscopic guidance.      Localized  severe mucosal changes characterized by congestion (edema),       erythema, friability, granularity, mucus and deep ulcerations were found       in the recto-sigmoid colon and in the sigmoid colon.      A few small-mouthed diverticula were found in the recto-sigmoid colon       but these particular ones were not inflammed.      Normal mucosa was found in the rectum. Impression:               - Non-thrombosed external hemorrhoids found on                            digital rectal exam.                           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 Wire passed,                            injected contrast to understand length.                            Decompression tube placed via Fluoroscopy.                           - Localized severe mucosal changes were found in                            the recto-sigmoid colon and in the sigmoid colon.                           - Diverticulosis in the recto-sigmoid colon.                           - Normal mucosa in the rectum. Moderate Sedation:      N/A- Per Anesthesia Care Recommendation:           - The patient will be observed post-procedure,                            until all discharge criteria are met.                           -  Return patient to hospital ward for ongoing care.                           - Rectal tube to gravity drainage.                           - NGT to LIWS or as per Surgical service.                           - If significant worsening/progression of pain then                            proceed with KUB to ensure free-air/perforation is                            not noted.                           - If the conservative measures of NGT and Rectal                            tube decompression are ineffective, colonic                            stenting in a presumed infectious diverticular                            region is felt to be too high risk for                            complications and would not be offered.  Surgical                            options become the best next step for patient                            managment.                           - The findings and recommendations were discussed                            with the patient.                           - The findings and recommendations were discussed                            with the patient's family.                           - The findings and recommendations were discussed                            with the referring physician. Procedure Code(s):        ---  Professional ---                           [phone], Sigmoidoscopy, flexible; with decompression                            (for pathologic distention) (eg, volvulus,                            megacolon), including placement of decompression                            tube, when performed Diagnosis Code(s):        --- Professional ---                           K63.89, Other specified diseases of intestine                           K64.4, Residual hemorrhoidal skin tags                           K56.699, Other intestinal obstruction unspecified                            as to partial versus complete obstruction                           Z85.038, Personal history of other malignant                            neoplasm of large intestine                           Z01.818, Encounter for other preprocedural                            examination                           K56.609, Unspecified intestinal obstruction,                            unspecified as to partial versus complete                            obstruction                           K57.30, Diverticulosis of large intestine without                            perforation or abscess without bleeding                           R93.3, Abnormal findings on diagnostic imaging of  other parts of digestive tract CPT copyright 2017 [employer]. All rights reserved. The  codes documented in this report are preliminary and upon coder review may  be revised to meet current compliance requirements. Justice Britain, MD 06/12/2018 5:35:06 PM Number of Addenda: 0

## 2018-06-12 NOTE — H&P (Signed)
GI Interval H&P Note  Patient seen and examined at time of consultation today. Her examination is unchanged. We discussed the role of Sigmoidoscopy with attempt at colonic decompression. The risks and benefits of endoscopic evaluation were discussed with the patient; these include but are not limited to the risk of perforation, infection, bleeding, missed lesions, lack of diagnosis, severe illness requiring hospitalization, as well as anesthesia and sedation related illnesses.  They understand that in her case risk is higher due to her current clinical scenario. The patient and family are agreeable to proceed.    Justice Britain, MD [facility] Advanced [facility]oscopy Office # [phone]

## 2018-06-13 ENCOUNTER — Inpatient Hospital Stay (HOSPITAL_COMMUNITY): Payer: Medicare Other

## 2018-06-13 ENCOUNTER — Inpatient Hospital Stay: Payer: Self-pay

## 2018-06-13 LAB — BASIC METABOLIC PANEL
ANION GAP: 4 — AB (ref 5–15)
BUN: 20 mg/dL (ref 8–23)
CHLORIDE: 114 mmol/L — AB (ref 98–111)
CO2: 23 mmol/L (ref 22–32)
Calcium: 9 mg/dL (ref 8.9–10.3)
Creatinine, Ser: 1.15 mg/dL — ABNORMAL HIGH (ref 0.44–1.00)
GFR calc Af Amer: 51 mL/min — ABNORMAL LOW (ref >60)
GFR, EST NON AFRICAN AMERICAN: 44 mL/min — AB (ref >60)
Glucose, Bld: 98 mg/dL (ref 70–99)
POTASSIUM: 4.4 mmol/L (ref 3.5–5.1)
SODIUM: 141 mmol/L (ref 135–145)

## 2018-06-13 LAB — CBC WITH DIFFERENTIAL/PLATELET
BASOS ABS: 0 10*3/uL (ref 0.0–0.1)
Basophils Relative: 0 %
EOS PCT: 1 %
Eosinophils Absolute: 0.1 10*3/uL (ref 0.0–0.7)
HCT: 29.5 % — ABNORMAL LOW (ref 36.0–46.0)
HEMOGLOBIN: 9.5 g/dL — AB (ref 12.0–15.0)
Lymphocytes Relative: 11 %
Lymphs Abs: 0.8 10*3/uL (ref 0.7–4.0)
MCH: 29.9 pg (ref 26.0–34.0)
MCHC: 32.2 g/dL (ref 30.0–36.0)
MCV: 92.8 fL (ref 78.0–100.0)
Monocytes Absolute: 1.3 10*3/uL — ABNORMAL HIGH (ref 0.1–1.0)
Monocytes Relative: 17 %
NEUTROS PCT: 71 %
Neutro Abs: 5.2 10*3/uL (ref 1.7–7.7)
Platelets: 430 10*3/uL — ABNORMAL HIGH (ref 150–400)
RBC: 3.18 MIL/uL — AB (ref 3.87–5.11)
RDW: 13.4 % (ref 11.5–15.5)
WBC: 7.3 10*3/uL (ref 4.0–10.5)

## 2018-06-13 MED ORDER — PHENOL 1.4 % MT LIQD
1.0000 | OROMUCOSAL | Status: DC | PRN
  Administered 2018-06-13: 1 via OROMUCOSAL
  Filled 2018-06-13: qty 177

## 2018-06-13 MED ORDER — MENTHOL 3 MG MT LOZG
1.0000 | LOZENGE | OROMUCOSAL | Status: DC | PRN
  Filled 2018-06-13: qty 9

## 2018-06-13 MED ORDER — SODIUM CHLORIDE 0.9% FLUSH
10.0000 mL | INTRAVENOUS | Status: DC | PRN
  Administered 2018-06-18: 20 mL
  Administered 2018-06-19: 10 mL
  Filled 2018-06-13 (×2): qty 40

## 2018-06-13 MED ORDER — PROMETHAZINE HCL 25 MG/ML IJ SOLN
12.5000 mg | Freq: Once | INTRAMUSCULAR | Status: AC
  Administered 2018-06-13: 12.5 mg via INTRAVENOUS
  Filled 2018-06-13: qty 1

## 2018-06-13 NOTE — Progress Notes (Signed)
PROGRESS NOTE  Kristin [name] MWU:[id number] DOB: 03/01/1941 DOA: 06/11/2018 PCP: Eber Hong, MD  HPI/Recap of past 24 hours: Kristin [name] is a 77 y.o. 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 Patient also reports some urinary symptoms and was recently prescribed Keflex. In the ED, CT of the abdomen and pelvis reveals a short segment of thickening in the rectosigmoid colon causing obstruction. UA suggestive of infection. Surgery consulted. Pt admitted for further management.   Today, pt reported abdominal pain is a little better, still distended. Denies any chest pain, SOB, fever/chills.  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)   Personal history of breast cancer   Hypertension   Malignant neoplasm of ascending colon s/p robotic colectomy 03/06/2016   CKD (chronic kidney disease), stage III (HCC)   Large bowel obstruction (HCC)   Acute lower UTI  Large bowel obstruction  CT abd/pelvis showed thickening involving a short segment of rectosigmoid colon causing obstruction  Repeat Abd xray with no sig change Surgery on board: Appreciate recs, started pt on flagyl GI on board: Had sigmoidoscopy on 06/12/18, with rectal tube placement for decompression Continue NGT, rectal tube, NPO, IVF hydration, analgesia, antiemetics Continue Flagyl  PICC line placed in anticipation for long hospital stay, possible TPN  UTI  Afebrile, no leukocytosis  UA with mod leukocytes, WBC 11-20  UC pending collection Continue IV Rocephin  History of colon/lung/breast cancer CT C/A/P last month with no concerning findings for  disease progression per oncology notes Oncology outpatient follow up  Hypertension  Hydralazine IV prn for now while NPO  Hold home lisinopril  CKD stage III  Cr at baseline     Code Status: Full  Family Communication: None at bedside  Disposition Plan: Once work up complete, with significant improvement   Consultants:  Gen surg  GI   Procedures:  Sigmoidoscopy on 06/12/18  Antimicrobials:  IV ceftriaxone   IV Flagyl   DVT prophylaxis: Heparin [place]   Objective: Vitals:   06/12/18 1835 06/12/18 2053 06/13/18 0531 06/13/18 1256  BP: (!) 148/63 (!) 154/65 (!) 149/63 (!) 154/55  Pulse: 67 73 75 74  Resp: 16 16 16 15   Temp: 98.7 F (37.1 C) 98.2 F (36.8 C) 98.6 F (37 C) 98.1 F (36.7 C)  TempSrc: Oral Oral Oral Oral  SpO2: 99% 99% 95% 96%  Weight:      Height:        Intake/Output Summary (Last 24 hours) at 06/13/2018 1524 Last data filed at 06/13/2018 1459 Gross per 24 hour  Intake 1964.06 ml  Output 525 ml  Net 1439.06 ml   Filed Weights   06/12/18 0500 06/12/18 1250 06/12/18 1727  Weight: 63.5 kg 63.5 kg 65.6 kg    Exam:   General: NAD  Cardiovascular: S1, S2 present  Respiratory: CTAB  Abdomen: Mildly tense, distended, generalized tenderness, tympanic bowel sounds  Musculoskeletal: No pedal edema bilaterally  Skin: Normal  Psychiatry: Normal mood   Data Reviewed: CBC: Recent Labs  Lab 06/11/18 1856 06/12/18 0418 06/13/18 0519  WBC 8.9 6.7 7.3  NEUTROABS 6.8  --  5.2  HGB 11.9* 10.0* 9.5*  HCT 37.3 31.7* 29.5*  MCV 91.9 92.2 92.8  PLT 522* 468* 119*   Basic Metabolic Panel: Recent Labs  Lab 06/11/18 1856 06/12/18 0418 06/13/18 0519  NA 138 142 141  K 4.6 5.1 4.4  CL 108 114* 114*  CO2 23 23 23   GLUCOSE 146* 114* 98  BUN 27* 25* 20  CREATININE 1.51* 1.32* 1.15*  CALCIUM 9.5 9.1 9.0   GFR: Estimated Creatinine Clearance: 37.9 mL/min (A) (by C-G formula based on SCr of 1.15 mg/dL (H)). Liver Function  Tests: Recent Labs  Lab 06/11/18 1856  AST 14*  ALT 11  ALKPHOS 77  BILITOT 0.3  PROT 7.0  ALBUMIN 3.2*   Recent Labs  Lab 06/11/18 1856  LIPASE 55*   No results for input(s): AMMONIA in the last 168 hours. Coagulation Profile: No results for input(s): INR, PROTIME in the last 168 hours. Cardiac Enzymes: No results for input(s): CKTOTAL, CKMB, CKMBINDEX, TROPONINI in the last 168 hours. BNP (last 3 results) No results for input(s): PROBNP in the last 8760 hours. HbA1C: No results for input(s): HGBA1C in the last 72 hours. CBG: No results for input(s): GLUCAP in the last 168 hours. Lipid Profile: No results for input(s): CHOL, HDL, LDLCALC, TRIG, CHOLHDL, LDLDIRECT in the last 72 hours. Thyroid Function Tests: No results for input(s): TSH, T4TOTAL, FREET4, T3FREE, THYROIDAB in the last 72 hours. Anemia Panel: No results for input(s): VITAMINB12, FOLATE, FERRITIN, TIBC, IRON, RETICCTPCT in the last 72 hours. Urine analysis:    Component Value Date/Time   COLORURINE YELLOW 06/11/2018 1801   APPEARANCEUR CLEAR 06/11/2018 1801   LABSPEC 1.024 06/11/2018 1801   PHURINE 5.0 06/11/2018 1801   GLUCOSEU NEGATIVE 06/11/2018 1801   HGBUR NEGATIVE 06/11/2018 1801   BILIRUBINUR NEGATIVE 06/11/2018 1801   KETONESUR NEGATIVE 06/11/2018 1801   PROTEINUR NEGATIVE 06/11/2018 1801   UROBILINOGEN 0.2 04/29/2013 2243   NITRITE NEGATIVE 06/11/2018 1801   LEUKOCYTESUR MODERATE (A) 06/11/2018 1801   Sepsis Labs: @LABRCNTIP (procalcitonin:4,lacticidven:4)  )No results found for this or any previous visit (from the past 240 hour(s)).    Studies: Dg Abd 2 Views  Result Date: 06/13/2018 CLINICAL DATA:  Abdominal pain, diarrhea, colonic obstruction EXAM: ABDOMEN - 2 VIEW COMPARISON:  Abdominal radiograph from earlier today FINDINGS: Enteric tube terminates in distal stomach. Rectal catheter terminates in left deep pelvis. No evidence of pneumatosis or pneumoperitoneum. Persistent marked  gaseous distension of remnant large bowel measuring up to 13.4 cm diameter in the proximal colon, not appreciably changed. No disproportionately dilated small bowel loops. No radiopaque nephrolithiasis. Mild bibasilar scarring versus atelectasis. Left total hip arthroplasty. IMPRESSION: 1. Enteric tube terminates in the distal stomach. 2. Rectal catheter terminates in the left deep pelvis probably near the rectosigmoid junction. 3. No appreciable change in marked gaseous distention of the remnant large bowel, most prominent in the proximal remnant colon. No evidence of free air. Electronically Signed   By: Ilona Sorrel M.D.   On: 06/13/2018 10:24   Dg Abd Portable 1v  Result Date: 06/13/2018 CLINICAL DATA:  Followup large bowel obstruction. EXAM: PORTABLE ABDOMEN - 1 VIEW COMPARISON:  06/12/2018 FINDINGS: Nasogastric tube tip in the antrum of the stomach. Small bore drainage catheter inserted per rectum with its tip in the sigmoid region. Dilated postsurgical proximal colon again demonstrated, maximal diameter 14 cm today, the same as yesterday. Small bowel pattern is unremarkable. IMPRESSION: Sigmoid decompression tube in place. Continued gaseous distension of the postsurgical proximal colon with maximal diameter 14 cm Electronically Signed   By: Elta Guadeloupe  Shogry M.D.   On: 06/13/2018 06:58   [place] Ekg Site Rite  Result Date: 06/13/2018 If Site Rite image not attached, placement could not be confirmed due to current cardiac rhythm.   Scheduled Meds: . brimonidine  1 drop Right Eye Q12H   And  . timolol  1 drop Right Eye Q12H  . cycloSPORINE  1 drop Both Eyes BID  . heparin  5,000 Units Subcutaneous Q8H    Continuous Infusions: . sodium chloride 75 mL/hr at 06/13/18 0930  . cefTRIAXone (ROCEPHIN)  IV Stopped (06/12/18 2352)  . famotidine (PEPCID) IV 75 mL/hr at 06/13/18 1459  . metronidazole Stopped (06/13/18 1414)     LOS: 2 days     Alma Friendly, MD [facility]   If  7PM-7AM, please contact night-coverage [email or web address] Password [email or web address] 06/13/2018, 3:24 PM

## 2018-06-13 NOTE — Progress Notes (Signed)
Peripherally Inserted Central Catheter/Midline Placement  The IV Nurse has discussed with the patient and/or persons authorized to consent for the patient, the purpose of this procedure and the potential benefits and risks involved with this procedure.  The benefits include less needle sticks, lab draws from the catheter, and the patient may be discharged home with the catheter. Risks include, but not limited to, infection, bleeding, blood clot (thrombus formation), and puncture of an artery; nerve damage and irregular heartbeat and possibility to perform a PICC exchange if needed/ordered by physician.  Alternatives to this procedure were also discussed.  Bard Power PICC patient education guide, fact sheet on infection prevention and patient information card has been provided to patient /or left at bedside.    PICC/Midline Placement Documentation  PICC Double Lumen 79/39/03 PICC Right Basilic 39 cm 0 cm (Active)  Indication for Insertion or Continuance of Line Administration of hyperosmolar/irritating solutions (i.e. TPN, Vancomycin, etc.) 06/13/2018 10:58 AM  Exposed Catheter (cm) 0 cm 06/13/2018 10:58 AM  Site Assessment Clean;Dry;Intact 06/13/2018 10:58 AM  Lumen #1 Status Blood return noted;Flushed;Saline locked 06/13/2018 10:58 AM  Lumen #2 Status Blood return noted;Flushed;Saline locked 06/13/2018 10:58 AM  Dressing Type Transparent;Securing device 06/13/2018 10:58 AM  Dressing Status Clean;Dry;Intact;Antimicrobial disc in place 06/13/2018 10:58 AM  Dressing Intervention New dressing 06/13/2018 10:58 AM  Dressing Change Due 06/20/18 06/13/2018 10:58 AM       Aldona Lento L 06/13/2018, 11:16 AM

## 2018-06-13 NOTE — Progress Notes (Signed)
Gastroenterology Inpatient Follow-up Note   PATIENT IDENTIFICATION  Kristin [name] is a 77 y.o. female with a pmh significant for Colon CA s/p Rt hemicolectomy, Beast Cancer, Lung Cancer, CRI, HTN, [place] diverticulosis, who presented with SBO/LBO with R/S mass-lesion vs inflammation. Hospital Day: 3  SUBJECTIVE  Patient underwent a successful rectal tube placement technically yesterday. Patient feels slightly better than yesterday to today. However KUB does not show significant improvement in distention of her abdomen. She has no further chills. She is slightly hungry. She is getting a PICC line today that she can have long-term parenteral nutrition. Family and patient are remaining in good spirits with hope for clinical improvement. No evidence of perforation or post flex sig complications are noted.   OBJECTIVE  Scheduled Inpatient Medications:  . brimonidine  1 drop Right Eye Q12H   And  . timolol  1 drop Right Eye Q12H  . cycloSPORINE  1 drop Both Eyes BID  . heparin  5,000 Units Subcutaneous Q8H   Continuous Inpatient Infusions:  . sodium chloride 75 mL/hr at 06/13/18 0930  . cefTRIAXone (ROCEPHIN)  IV 1 g (06/13/18 2129)  . famotidine (PEPCID) IV 75 mL/hr at 06/13/18 1800  . metronidazole 500 mg (06/13/18 2222)   PRN Inpatient Medications: acetaminophen **OR** acetaminophen, fentaNYL (SUBLIMAZE) injection, hydrALAZINE, menthol-cetylpyridinium, ondansetron (ZOFRAN) IV, phenol, promethazine, sodium chloride flush   Physical Examination  Temp:  [98.1 F (36.7 C)-99.2 F (37.3 C)] 99.2 F (37.3 C) (08/24 2017) Pulse Rate:  [74-77] 77 (08/24 2017) Resp:  [15-16] 16 (08/24 2017) BP: (149-160)/(55-63) 160/60 (08/24 2017) SpO2:  [95 %-97 %] 97 % (08/24 2017) Temp (24hrs), Avg:98.6 F (37 C), Min:98.1 F (36.7 C), Max:99.2 F (37.3 C)  Weight: 65.6 kg GEN: NAD, resting comfortably in bed  PSYCH: Cooperative, without pressured speech EYE: Conjunctivae pink, sclerae  anicteric ENT: Dry mucous membranes, NG tube in place  CV: Without rubs or gallops RESP: Rhonchi present GI: Protuberant, tympanic to percussion, tenderness to palpation in the left upper quadrant and left lower quadrant region on deep palpation, very minimal volitional guarding, no rebound, hyperactive bowel sounds GU: Rectal decompression tube in place MSK/EXT: No lower extremity edema NEURO:  Alert & Oriented x 3, no focal deficits   Review of Data   Laboratory Studies   Recent Labs  Lab 06/13/18 0519  NA 141  K 4.4  CL 114*  CO2 23  BUN 20  CREATININE 1.15*  GLUCOSE 98  CALCIUM 9.0   Recent Labs  Lab 06/11/18 1856  AST 14*  ALT 11  ALKPHOS 77    Recent Labs  Lab 06/11/18 1856 06/12/18 0418 06/13/18 0519  WBC 8.9 6.7 7.3  HGB 11.9* 10.0* 9.5*  HCT 37.3 31.7* 29.5*  PLT 522* 468* 430*   No results for input(s): APTT, INR in the last 168 hours.  Studies  Dg Abd 1 View - Kub  Result Date: 06/12/2018 CLINICAL DATA:  Flexible sigmoidoscopy with bowel decompression. EXAM: ABDOMEN - 1 VIEW COMPARISON:  04/12/2018. FINDINGS: Sigmoid disco, catheter, and guidewire noted in the rectosigmoid. Eight images obtained. 6 minutes 36 seconds fluoroscopy time utilized. Radiation dose 129.5 mGy. IMPRESSION: Procedure findings as above. Electronically Signed   By: Marcello Moores  Register   On: 06/12/2018 15:08   Dg Abd 1 View  Result Date: 06/12/2018 CLINICAL DATA:  Nasogastric tube placement. EXAM: ABDOMEN - 1 VIEW COMPARISON:  Radiographs earlier the same date.  CT 06/11/2018. FINDINGS: Enteric tube is visualized within the midline of the  upper abdomen, consistent with location in the mid stomach as correlated with recent CT. There is a moderate size hiatal hernia. There is persistent marked dilatation of the transverse colon status post right hemicolectomy. The visualized small bowel also appears mildly dilated. No supine evidence of free intraperitoneal air. Previous left total hip  arthroplasty noted. IMPRESSION: Enteric tube extends to the level of the mid stomach. Persistent findings consistent with distal colonic obstruction. Electronically Signed   By: Richardean Sale M.D.   On: 06/12/2018 13:01   Dg Abd 2 Views  Result Date: 06/13/2018 CLINICAL DATA:  Abdominal pain, diarrhea, colonic obstruction EXAM: ABDOMEN - 2 VIEW COMPARISON:  Abdominal radiograph from earlier today FINDINGS: Enteric tube terminates in distal stomach. Rectal catheter terminates in left deep pelvis. No evidence of pneumatosis or pneumoperitoneum. Persistent marked gaseous distension of remnant large bowel measuring up to 13.4 cm diameter in the proximal colon, not appreciably changed. No disproportionately dilated small bowel loops. No radiopaque nephrolithiasis. Mild bibasilar scarring versus atelectasis. Left total hip arthroplasty. IMPRESSION: 1. Enteric tube terminates in the distal stomach. 2. Rectal catheter terminates in the left deep pelvis probably near the rectosigmoid junction. 3. No appreciable change in marked gaseous distention of the remnant large bowel, most prominent in the proximal remnant colon. No evidence of free air. Electronically Signed   By: Ilona Sorrel M.D.   On: 06/13/2018 10:24   Dg Abd Portable 1v  Result Date: 06/13/2018 CLINICAL DATA:  Followup large bowel obstruction. EXAM: PORTABLE ABDOMEN - 1 VIEW COMPARISON:  06/12/2018 FINDINGS: Nasogastric tube tip in the antrum of the stomach. Small bore drainage catheter inserted per rectum with its tip in the sigmoid region. Dilated postsurgical proximal colon again demonstrated, maximal diameter 14 cm today, the same as yesterday. Small bowel pattern is unremarkable. IMPRESSION: Sigmoid decompression tube in place. Continued gaseous distension of the postsurgical proximal colon with maximal diameter 14 cm Electronically Signed   By: Nelson Chimes M.D.   On: 06/13/2018 06:58   Dg Abd Portable 1v  Result Date: 06/12/2018 CLINICAL  DATA:  Abdominal pain and diarrhea EXAM: PORTABLE ABDOMEN - 1 VIEW COMPARISON:  CT abdomen and pelvis June 11, 2018 FINDINGS: There is dilatation of most of the colon to the level of the mid sigmoid colon where there is abrupt termination of the contrast column. Small bowel loops are largely fluid-filled. No air-fluid levels. No appreciable free air. There are uterine calcifications consistent with leiomyomatous change. Bones are osteoporotic. There is a total hip replacement on the left. IMPRESSION: Findings indicative of distal colonic obstruction. Most small bowel loops are fluid filled. There is colonic distention proximal to the sigmoid colon, similar to 1 day prior. No free air is appreciable. 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 Total hip replacement on the left. Electronically Signed   By: Lowella Grip III M.D.   On: 06/12/2018 09:15   Dg C-arm 1-60 Min-no Report  Result Date: 06/12/2018 Fluoroscopy was utilized by the requesting physician.  No radiographic interpretation.   [place] Ekg Site Rite  Result Date: 06/13/2018 If Site Rite image not attached, placement could not be confirmed due to current cardiac rhythm.    ASSESSMENT  Ms. Cappello is a 77 y.o. female with a pmh significant for Colon CA s/p Rt hemicolectomy, Beast Cancer, Lung Cancer, CRI, HTN, [place] diverticulosis, who presented with SBO/LBO with R/S mass-lesion v  Overall, patient underwent a technically successful rectal tube decompression placement into the dilated sigmoid colon.  However,  clinically it is not clear that she has had a clinical success continues to have a very distended abdomen.  I discussed her case with a few of my former colleagues at [employer] and all were in agreement that in the setting of active inflammation that is felt to be benign etiology that basement of an enteral stent is too high of a risk.  Based on ESGE guidelines and review of the data with the rate of complication for  patients with diverticular stricturing disease can be as high as 17 to 18% and that was looking at patients who were with a benign stricture of the colon rather than active inflammation.  I would maintain her NG tube and rectal tube and see if things improve.  I am hopeful, though realistic, that she may likely require a two-stage operation.  We will see how she does over the course of today into tomorrow.  I discussed her case with her surgical team who would like to monitor patient and optimize her nutrition for surgery.  PLAN/RECOMMENDATIONS  Continue rectal tube to gravity Continue NG tube to low intermittent wall suction Nutrition as per primary service and surgical service Fingers crossed that patient will turn around Continue antibiotics   Please page/call with questions or concerns.   Justice Britain, MD [facility] Office # [phone]    LOS: 2 days  Irving Copas  06/13/2018, 10:27 PM

## 2018-06-13 NOTE — Progress Notes (Signed)
[facility] Office:  [phone] General Surgery Progress Note   LOS: 2 days  POD -  1 Day Post-Op  Chief Complaint: Abdominal pain  Assessment and Plan: 1.  Distal sigmoid/proximal rectal obstruciton  FLEXIBLE SIGMOIDOSCOPY, BOWEL DECOMPRESSION with 10 F tube - [place] - 06/12/2018  WBC - 7,300 - 06/13/2018  On Rocephin/flagyl  Has NGT - somewhat better.  Will place PICC line, in anticipation of prolonged hospitalization  2.  S/P right colectomy - May 2017 - Dr. Johney Maine for T2, N0 adenoca  3.  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  Followed by Dr. Julien Nordmann 4.  Plavix - last dose 8/22  For CVA 5.  History of breast cancer 6.  Depression/anxiety 7.  Anemia of chronic disease  Hgb - 9.5 - 06/13/2018 8.  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   I spent some time reviewing plans.  Will most likely need surgery during this admission.  Discussed PICC line and probable TPN.  Daughter, Betsy Pries, at bedside.    Objective:   Vitals:   06/12/18 2053 06/13/18 0531  BP: (!) 154/65 (!) 149/63  Pulse: 73 75  Resp: 16 16  Temp: 98.2 F (36.8 C) 98.6 F (37 C)  SpO2: 99% 95%     Intake/Output from previous day:  08/23 0701 - 08/24 0700 In: 2985.7 [P.O.:120; I.V.:2265.7; NG/GT:50; IV Piggyback:550] Out: 525 [Urine:500; Emesis/NG output:25]  Intake/Output this shift:  No intake/output data recorded.   Physical Exam:   General: Older WF who is alert  and oriented.    HEENT: Normal. Pupils equal. .   Lungs: Clear   Abdomen: Mild tenderness RLQ.  Has high pitched bowel sounds   Lab Results:    Recent Labs    06/12/18 0418 06/13/18 0519  WBC 6.7 7.3  HGB 10.0* 9.5*  HCT 31.7* 29.5*  PLT 468* 430*    BMET   Recent Labs    06/12/18 0418 06/13/18 0519  NA 142 141  K 5.1 4.4  CL 114* 114*  CO2 23 23  GLUCOSE 114* 98  BUN 25* 20  CREATININE 1.32* 1.15*  CALCIUM 9.1 9.0    PT/INR  No results for input(s): LABPROT, INR in the last 72 hours.  ABG  No results for input(s): PHART, HCO3 in the last 72 hours.  Invalid input(s): PCO2, PO2   Studies/Results:  Dg Abd 1 View - Kub  Result Date: 06/12/2018 CLINICAL DATA:  Flexible sigmoidoscopy with bowel decompression. EXAM: ABDOMEN - 1 VIEW COMPARISON:  04/12/2018. FINDINGS: Sigmoid disco, catheter, and guidewire noted in the rectosigmoid. Eight images obtained. 6 minutes 36 seconds fluoroscopy time utilized. Radiation dose 129.5 mGy. IMPRESSION: Procedure findings as above. Electronically Signed   By: Marcello Moores  Register   On: 06/12/2018 15:08   Dg Abd 1 View  Result Date: 06/12/2018 CLINICAL DATA:  Nasogastric tube placement. EXAM: ABDOMEN - 1 VIEW COMPARISON:  Radiographs earlier the same date.  CT 06/11/2018. FINDINGS:  Enteric tube is visualized within the midline of the upper abdomen, consistent with location in the mid stomach as correlated with recent CT. There is a moderate size hiatal hernia. There is persistent marked dilatation of the transverse colon status post right hemicolectomy. The visualized small bowel also appears mildly dilated. No supine evidence of free intraperitoneal air. Previous left total hip arthroplasty noted. IMPRESSION: Enteric tube extends to the level of the mid stomach. Persistent findings consistent with distal colonic obstruction. Electronically Signed   By: Richardean Sale M.D.   On: 06/12/2018 13:01   Ct Abdomen Pelvis W Contrast  Result Date:  06/11/2018 CLINICAL DATA:  Persistent lower abdominal pain and pressure. History of right colectomy for ascending colon cancer. EXAM: CT ABDOMEN AND PELVIS WITH CONTRAST TECHNIQUE: Multidetector CT imaging of the abdomen and pelvis was performed using the standard protocol following bolus administration of intravenous contrast. CONTRAST:  31m ISOVUE-300 IOPAMIDOL (ISOVUE-300) INJECTION 61% COMPARISON:  05/04/2018 chest CT FINDINGS: Lower chest: Normal heart size. No pericardial effusion. Bibasilar dependent atelectasis. Moderate-sized hiatal hernia. Hepatobiliary: Stable 2.6 cm cyst in the right hepatic lobe. No enhancing mass lesions of the liver. Physiologic distention of the gallbladder without mural thickening or pericholecystic fluid. No biliary dilatation. Pancreas: No ductal dilatation, mass inflammation.  Mild atrophy. Spleen: No splenomegaly or mass. Adrenals/Urinary Tract: Normal bilateral adrenal glands. Symmetric cortical enhancement of both kidneys with small interpolar cyst in the right kidney unchanged in appearance measuring approximately 4 mm. No nephrolithiasis nor hydroureteronephrosis. Urinary bladder is physiologically distended without focal mural thickening or calculi. Stomach/Bowel: Moderate-sized hiatal hernia. Nondistended stomach. Diffuse fluid-filled distended small and large bowel loops are identified to the level of the rectosigmoid where there is segmental transmural thickening noted causing large and small bowel obstruction. Dilatation of the colon to 9 cm is noted. The patient is status post right colectomy. Although findings may be secondary to a severe colitis, neoplasm is of concern. Vascular/Lymphatic: Aortoiliac atherosclerosis without aneurysm. No adenopathy. Reproductive: Fibroid uterus.  No adnexal mass. Other: No free air nor free fluid. Musculoskeletal: Degenerative changes are noted along the dorsal spine without acute nor aggressive osseous lesions. IMPRESSION: 1. Short  segmental thickening in the rectosigmoid causing large and small-bowel obstruction. Clinical correlation to exclude neoplasm is recommended. Marked inflammatory thickening is a differential possibility. Dilatation of fluid-filled large bowel is noted to 9 cm. 2. Status post right partial colectomy.  Intact staple line. 3. Stable 2.6 cm in the right hepatic lobe compatible with a cyst. 4. Moderate-sized hiatal hernia. Electronically Signed   By: DAshley RoyaltyM.D.   On: 06/11/2018 20:49   Dg Abd Portable 1v  Result Date: 06/13/2018 CLINICAL DATA:  Followup large bowel obstruction. EXAM: PORTABLE ABDOMEN - 1 VIEW COMPARISON:  06/12/2018 FINDINGS: Nasogastric tube tip in the antrum of the stomach. Small bore drainage catheter inserted per rectum with its tip in the sigmoid region. Dilated postsurgical proximal colon again demonstrated, maximal diameter 14 cm today, the same as yesterday. Small bowel pattern is unremarkable. IMPRESSION: Sigmoid decompression tube in place. Continued gaseous distension of the postsurgical proximal colon with maximal diameter 14 cm Electronically Signed   By: MNelson ChimesM.D.   On: 06/13/2018 06:58   Dg Abd Portable 1v  Result Date: 06/12/2018 CLINICAL DATA:  Abdominal pain and diarrhea EXAM: PORTABLE ABDOMEN - 1 VIEW COMPARISON:  CT abdomen and pelvis June 11, 2018 FINDINGS: There is dilatation of most of the colon to the level of the mid sigmoid colon where  there is abrupt termination of the contrast column. Small bowel loops are largely fluid-filled. No air-fluid levels. No appreciable free air. There are uterine calcifications consistent with leiomyomatous change. Bones are osteoporotic. There is a total hip replacement on the left. IMPRESSION: Findings indicative of distal colonic obstruction. Most small bowel loops are fluid filled. There is colonic distention proximal to the sigmoid colon, similar to 1 day prior. No free air is appreciable. There are calcified uterine  leiomyomas. Bones are osteoporotic. Total hip replacement on the left. Electronically Signed   By: Lowella Grip III M.D.   On: 06/12/2018 09:15   Dg C-arm 1-60 Min-no Report  Result Date: 06/12/2018 Fluoroscopy was utilized by the requesting physician.  No radiographic interpretation.     Anti-infectives:   Anti-infectives (From admission, onward)   Start     Dose/Rate Route Frequency Ordered Stop   06/12/18 1700  metroNIDAZOLE (FLAGYL) IVPB 500 mg     500 mg 100 mL/hr over 60 Minutes Intravenous Every 8 hours 06/12/18 1604     06/11/18 2245  cefTRIAXone (ROCEPHIN) 1 g in sodium chloride 0.9 % 100 mL IVPB     1 g 200 mL/hr over 30 Minutes Intravenous Daily at bedtime 06/11/18 2204        Alphonsa Overall, MD, FACS Pager: [facility] Office: [phone] 06/13/2018

## 2018-06-14 ENCOUNTER — Inpatient Hospital Stay (HOSPITAL_COMMUNITY): Payer: Medicare Other

## 2018-06-14 DIAGNOSIS — R935 Abnormal findings on diagnostic imaging of other abdominal regions, including retroperitoneum: Secondary | ICD-10-CM

## 2018-06-14 LAB — CBC WITH DIFFERENTIAL/PLATELET
BASOS PCT: 0 %
Basophils Absolute: 0 10*3/uL (ref 0.0–0.1)
Eosinophils Absolute: 0.1 10*3/uL (ref 0.0–0.7)
Eosinophils Relative: 1 %
HEMATOCRIT: 26.2 % — AB (ref 36.0–46.0)
Hemoglobin: 8.5 g/dL — ABNORMAL LOW (ref 12.0–15.0)
Lymphocytes Relative: 14 %
Lymphs Abs: 0.9 10*3/uL (ref 0.7–4.0)
MCH: 30 pg (ref 26.0–34.0)
MCHC: 32.4 g/dL (ref 30.0–36.0)
MCV: 92.6 fL (ref 78.0–100.0)
MONO ABS: 1.1 10*3/uL — AB (ref 0.1–1.0)
Monocytes Relative: 15 %
NEUTROS ABS: 4.8 10*3/uL (ref 1.7–7.7)
Neutrophils Relative %: 70 %
Platelets: 381 10*3/uL (ref 150–400)
RBC: 2.83 MIL/uL — ABNORMAL LOW (ref 3.87–5.11)
RDW: 13.5 % (ref 11.5–15.5)
WBC: 6.9 10*3/uL (ref 4.0–10.5)

## 2018-06-14 LAB — URINE CULTURE: CULTURE: NO GROWTH

## 2018-06-14 LAB — BASIC METABOLIC PANEL
Anion gap: 6 (ref 5–15)
BUN: 18 mg/dL (ref 8–23)
CALCIUM: 8.6 mg/dL — AB (ref 8.9–10.3)
CO2: 22 mmol/L (ref 22–32)
CREATININE: 0.99 mg/dL (ref 0.44–1.00)
Chloride: 114 mmol/L — ABNORMAL HIGH (ref 98–111)
GFR calc non Af Amer: 53 mL/min — ABNORMAL LOW (ref >60)
GLUCOSE: 80 mg/dL (ref 70–99)
Potassium: 3.6 mmol/L (ref 3.5–5.1)
Sodium: 142 mmol/L (ref 135–145)

## 2018-06-14 MED ORDER — DEXTROSE-NACL 5-0.45 % IV SOLN
INTRAVENOUS | Status: AC
  Administered 2018-06-14 – 2018-06-15 (×2): via INTRAVENOUS

## 2018-06-14 NOTE — Progress Notes (Signed)
PROGRESS NOTE  Kristin [name] YDX:[id number] DOB: 10/19/1941 DOA: 06/11/2018 PCP: Eber Hong, MD  HPI/Recap of past 24 hours: Kristin [name] is a 77 y.o. 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 Patient also reports some urinary symptoms and was recently prescribed Keflex. In the ED, CT of the abdomen and pelvis reveals a short segment of thickening in the rectosigmoid colon causing obstruction. UA suggestive of infection. Surgery consulted. Pt admitted for further management.   Today, pt reported abdominal pain is a little better, less distended. Noted loose stool. Denies any fever/chills. Daughter at bedside  Assessment/Plan: Principal Problem:   Bowel obstruction [facility]) Active Problems:   Cancer of upper lobe of left lung (HCC)   Personal history of breast cancer   Hypertension   Malignant neoplasm of ascending colon s/p robotic colectomy 03/06/2016   CKD (chronic kidney disease), stage III (HCC)   Large bowel obstruction (HCC)   Acute lower UTI  Large bowel obstruction  CT abd/pelvis showed thickening involving a short segment of rectosigmoid colon causing obstruction  Repeat Abd xray with no sig change Surgery on board: Appreciate recs, continue flagyl GI on board: Had sigmoidoscopy on 06/12/18, with rectal tube placement for decompression Continue NGT, rectal tube, NPO, IVF hydration, analgesia, antiemetics Continue Flagyl, IV rocephin PICC line placed in anticipation for long hospital stay, possible TPN  UTI  Afebrile, no leukocytosis  UA with mod leukocytes, WBC 11-20  UC showed no growth  Anemia of chronic disease (Hx of malignancy/CKD) Hgb baseline around 10 Currently  likely worsened by hemodilution Check iron panel Daily CBC  History of colon/lung/breast cancer CT C/A/P last month with no concerning findings for disease progression per oncology notes Oncology outpatient follow up  Hypertension  Hydralazine IV prn for now while NPO  Hold home lisinopril  CKD stage III  Cr at baseline     Code Status: Full  Family Communication: Daughter at bedside  Disposition Plan: Once work up complete, with significant improvement   Consultants:  Gen surg  GI   Procedures:  Sigmoidoscopy on 06/12/18  Antimicrobials:  IV ceftriaxone   IV Flagyl   DVT prophylaxis: Heparin [place]   Objective: Vitals:   06/13/18 1256 06/13/18 2017 06/14/18 0650 06/14/18 1352  BP: (!) 154/55 (!) 160/60 (!) 128/43 (!) 155/54  Pulse: 74 77 74 75  Resp: 15 16 16 16   Temp: 98.1 F (36.7 C) 99.2 F (37.3 C) 99.3 F (37.4 C) 98.7 F (37.1 C)  TempSrc: Oral Oral Oral Oral  SpO2: 96% 97% 95% 99%  Weight:      Height:        Intake/Output Summary (Last 24 hours) at 06/14/2018 1450 Last data filed at 06/14/2018 1353 Gross per 24 hour  Intake 1447.96 ml  Output 820 ml  Net 627.96 ml   Filed Weights   06/12/18 0500 06/12/18 1250 06/12/18 1727  Weight: 63.5 kg 63.5 kg 65.6 kg    Exam:   General: NAD  Cardiovascular: S1, S2 present  Respiratory: CTAB  Abdomen: Soft, less distended, generalized tenderness, tympanic bowel sounds  Musculoskeletal: No pedal edema bilaterally  Skin: Normal  Psychiatry: Normal mood   Data Reviewed: CBC: Recent Labs  Lab 06/11/18 1856 06/12/18 0418 06/13/18 0519 06/14/18  0335  WBC 8.9 6.7 7.3 6.9  NEUTROABS 6.8  --  5.2 4.8  HGB 11.9* 10.0* 9.5* 8.5*  HCT 37.3 31.7* 29.5* 26.2*  MCV 91.9 92.2 92.8 92.6  PLT 522* 468* 430* 932   Basic Metabolic Panel: Recent Labs  Lab 06/11/18 1856 06/12/18 0418 06/13/18 0519 06/14/18 0335  NA 138 142 141 142  K 4.6 5.1 4.4 3.6  CL 108 114* 114* 114*  CO2 23  23 23 22   GLUCOSE 146* 114* 98 80  BUN 27* 25* 20 18  CREATININE 1.51* 1.32* 1.15* 0.99  CALCIUM 9.5 9.1 9.0 8.6*   GFR: Estimated Creatinine Clearance: 44 mL/min (by C-G formula based on SCr of 0.99 mg/dL). Liver Function Tests: Recent Labs  Lab 06/11/18 1856  AST 14*  ALT 11  ALKPHOS 77  BILITOT 0.3  PROT 7.0  ALBUMIN 3.2*   Recent Labs  Lab 06/11/18 1856  LIPASE 55*   No results for input(s): AMMONIA in the last 168 hours. Coagulation Profile: No results for input(s): INR, PROTIME in the last 168 hours. Cardiac Enzymes: No results for input(s): CKTOTAL, CKMB, CKMBINDEX, TROPONINI in the last 168 hours. BNP (last 3 results) No results for input(s): PROBNP in the last 8760 hours. HbA1C: No results for input(s): HGBA1C in the last 72 hours. CBG: No results for input(s): GLUCAP in the last 168 hours. Lipid Profile: No results for input(s): CHOL, HDL, LDLCALC, TRIG, CHOLHDL, LDLDIRECT in the last 72 hours. Thyroid Function Tests: No results for input(s): TSH, T4TOTAL, FREET4, T3FREE, THYROIDAB in the last 72 hours. Anemia Panel: No results for input(s): VITAMINB12, FOLATE, FERRITIN, TIBC, IRON, RETICCTPCT in the last 72 hours. Urine analysis:    Component Value Date/Time   COLORURINE YELLOW 06/11/2018 1801   APPEARANCEUR CLEAR 06/11/2018 1801   LABSPEC 1.024 06/11/2018 1801   PHURINE 5.0 06/11/2018 1801   GLUCOSEU NEGATIVE 06/11/2018 1801   HGBUR NEGATIVE 06/11/2018 1801   BILIRUBINUR NEGATIVE 06/11/2018 1801   KETONESUR NEGATIVE 06/11/2018 1801   PROTEINUR NEGATIVE 06/11/2018 1801   UROBILINOGEN 0.2 04/29/2013 2243   NITRITE NEGATIVE 06/11/2018 1801   LEUKOCYTESUR MODERATE (A) 06/11/2018 1801   Sepsis Labs: @LABRCNTIP (procalcitonin:4,lacticidven:4)  ) Recent Results (from the past 240 hour(s))  Urine Culture     Status: None   Collection Time: 06/13/18  3:30 PM  Result Value Ref Range Status   Specimen Description   Final    URINE, RANDOM Performed at  [facility], [place] [address] [place], [place] [address]    Special Requests   Final    NONE Performed at [facility], [place] [address], [place], [place] [address]    Culture   Final    NO GROWTH Performed at [facility], [place] [address], [place], [place] [address]    Report Status 06/14/2018 FINAL  Final      Studies: Dg Abd 1 View  Result Date: 06/14/2018 CLINICAL DATA:  Obstruction of bowel. EXAM: ABDOMEN - 1 VIEW COMPARISON:  Radiograph yesterday at 0900 hour, CT 06/11/2018 FINDINGS: Tip of the enteric tube in the stomach. Rectal catheter in place. No evidence of free air. Persistent marked gaseous distension of bowel in the central lower abdomen currently 12.2 cm, previously 13.4 cm. Air within nondistended distal colon and rectum. Single prominent small bowel loop in the right abdomen. No other small bowel dilatation. Suspect small pleural effusions and basilar atelectasis. Calcified uterine fibroid. IMPRESSION: No significant change in the significantly distended bowel in  the central lower abdomen, likely transverse colon. Air within nondistended colon distally with rectal catheter in place. Single prominent air-filled small bowel loop in the right lower abdomen without other small bowel dilatation. Electronically Signed   By: Jeb Levering M.D.   On: 06/14/2018 04:12    Scheduled Meds: . brimonidine  1 drop Right Eye Q12H   And  . timolol  1 drop Right Eye Q12H  . cycloSPORINE  1 drop Both Eyes BID  . heparin  5,000 Units Subcutaneous Q8H    Continuous Infusions: . sodium chloride Stopped (06/14/18 0514)  . cefTRIAXone (ROCEPHIN)  IV 1 g (06/13/18 2129)  . famotidine (PEPCID) IV 20 mg (06/14/18 1042)  . metronidazole 500 mg (06/14/18 1404)     LOS: 3 days     Alma Friendly, MD [facility]   If 7PM-7AM, please contact night-coverage [email or web address] Password [email or web address] 06/14/2018, 2:50 PM

## 2018-06-14 NOTE — Progress Notes (Signed)
Assessment & Plan: HD#4 Distal sigmoid/proximal rectal obstruciton             FLEXIBLE SIGMOIDOSCOPY, BOWEL DECOMPRESSION with 10 F tube - [place] - 06/12/2018             WBC - 7,300 - 06/13/2018             On Rocephin/flagyl             NG decompression - large output overnight  PICC line placement  TNA to start this evening  S/P right colectomy - May 2017 - Dr. Johney Maine for T2, N0 adenoca  History of lung cancer             metastatic non-small cell lung cancer, adenocarcinoma diagnosed in March 2012             Followed by Dr. Julien Nordmann  Plavix - last dose 8/22             For CVA  Appreciate GI involvement, recommendations, and management.  Dr. Johney Maine will be our hospital doctor beginning tomorrow and will assess patient and discuss plans for further management with team and family and patient.        Earnstine Regal, MD, [facility]       Office: [phone]   Chief Complaint: Colonic stricture with obstruction  Subjective: Patient in bed, daughter at bedside.  No complaints this AM.  Continues to have diarrhea.  Objective: Vital signs in last 24 hours: Temp:  [98.1 F (36.7 C)-99.3 F (37.4 C)] 99.3 F (37.4 C) (08/25 0650) Pulse Rate:  [74-77] 74 (08/25 0650) Resp:  [15-16] 16 (08/25 0650) BP: (128-160)/(43-60) 128/43 (08/25 0650) SpO2:  [95 %-97 %] 95 % (08/25 0650) Last BM Date: 06/13/18  Intake/Output from previous day: 08/24 0701 - 08/25 0700 In: 1676.4 [I.V.:1034.5; IV Piggyback:641.9] Out: 520 [Urine:120; Emesis/NG output:400] Intake/Output this shift: Total I/O In: -  Out: 200 [Emesis/NG output:200]  Physical Exam: HEENT - sclerae clear, mucous membranes moist Neck - soft Chest - clear bilaterally Cor - RRR Abdomen - protuberant, soft, minimally tender left mid abdomen; active BS present Neuro - alert & oriented, no focal deficits  Lab Results:  Recent Labs    06/13/18 0519 06/14/18 0335  WBC 7.3  6.9  HGB 9.5* 8.5*  HCT 29.5* 26.2*  PLT 430* 381   BMET Recent Labs    06/13/18 0519 06/14/18 0335  NA 141 142  K 4.4 3.6  CL 114* 114*  CO2 23 22  GLUCOSE 98 80  BUN 20 18  CREATININE 1.15* 0.99  CALCIUM 9.0 8.6*   PT/INR No results for input(s): LABPROT, INR in the last 72 hours. Comprehensive Metabolic Panel:    Component Value Date/Time   NA 142 06/14/2018 0335   NA 141 06/13/2018 0519   NA 138 09/26/2017 0942   NA 139 08/28/2017 0918   K 3.6 06/14/2018 0335   K 4.4 06/13/2018 0519   K 5.2 No visable hemolysis (H) 09/26/2017 0942   K 4.6 08/28/2017 0918   CL 114 (H) 06/14/2018 0335   CL 114 (H) 06/13/2018 0519   CL 107 04/05/2013 0754   CL 106 03/04/2013 0932   CO2 22 06/14/2018 0335   CO2 23 06/13/2018 0519   CO2 26 09/26/2017 0942   CO2 22 08/28/2017 0918   BUN 18 06/14/2018 0335   BUN 20 06/13/2018 0519   BUN  16.0 09/26/2017 0942   BUN 22.0 08/28/2017 0918   CREATININE 0.99 06/14/2018 0335   CREATININE 1.15 (H) 06/13/2018 0519   CREATININE 1.27 (H) 05/04/2018 1121   CREATININE 1.30 (H) 04/07/2018 1022   CREATININE 1.2 (H) 09/26/2017 0942   CREATININE 1.3 (H) 08/28/2017 0918   GLUCOSE 80 06/14/2018 0335   GLUCOSE 98 06/13/2018 0519   GLUCOSE 93 09/26/2017 0942   GLUCOSE 97 08/28/2017 0918   GLUCOSE 101 (H) 04/05/2013 0754   GLUCOSE 101 (H) 03/04/2013 0932   CALCIUM 8.6 (L) 06/14/2018 0335   CALCIUM 9.0 06/13/2018 0519   CALCIUM 9.9 09/26/2017 0942   CALCIUM 9.4 08/28/2017 0918   AST 14 (L) 06/11/2018 1856   AST 17 05/04/2018 1121   AST 13 04/07/2018 1022   AST 18 09/26/2017 0942   AST 18 08/28/2017 0918   ALT 11 06/11/2018 1856   ALT 15 05/04/2018 1121   ALT <6 04/07/2018 1022   ALT 13 09/26/2017 0942   ALT 13 08/28/2017 0918   ALKPHOS 77 06/11/2018 1856   ALKPHOS 70 05/04/2018 1121   ALKPHOS 68 09/26/2017 0942   ALKPHOS 66 08/28/2017 0918   BILITOT 0.3 06/11/2018 1856   BILITOT 0.3 05/04/2018 1121   BILITOT 0.3 04/07/2018 1022    BILITOT 0.31 09/26/2017 0942   BILITOT 0.33 08/28/2017 0918   PROT 7.0 06/11/2018 1856   PROT 6.8 05/04/2018 1121   PROT 6.6 09/26/2017 0942   PROT 6.5 08/28/2017 0918   ALBUMIN 3.2 (L) 06/11/2018 1856   ALBUMIN 3.8 05/04/2018 1121   ALBUMIN 3.6 09/26/2017 0942   ALBUMIN 3.5 08/28/2017 0918    Studies/Results: Dg Abd 1 View  Result Date: 06/14/2018 CLINICAL DATA:  Obstruction of bowel. EXAM: ABDOMEN - 1 VIEW COMPARISON:  Radiograph yesterday at 0900 hour, CT 06/11/2018 FINDINGS: Tip of the enteric tube in the stomach. Rectal catheter in place. No evidence of free air. Persistent marked gaseous distension of bowel in the central lower abdomen currently 12.2 cm, previously 13.4 cm. Air within nondistended distal colon and rectum. Single prominent small bowel loop in the right abdomen. No other small bowel dilatation. Suspect small pleural effusions and basilar atelectasis. Calcified uterine fibroid. IMPRESSION: No significant change in the significantly distended bowel in the central lower abdomen, likely transverse colon. Air within nondistended colon distally with rectal catheter in place. Single prominent air-filled small bowel loop in the right lower abdomen without other small bowel dilatation. Electronically Signed   By: Jeb Levering M.D.   On: 06/14/2018 04:12   Dg Abd 1 View - Kub  Result Date: 06/12/2018 CLINICAL DATA:  Flexible sigmoidoscopy with bowel decompression. EXAM: ABDOMEN - 1 VIEW COMPARISON:  04/12/2018. FINDINGS: Sigmoid disco, catheter, and guidewire noted in the rectosigmoid. Eight images obtained. 6 minutes 36 seconds fluoroscopy time utilized. Radiation dose 129.5 mGy. IMPRESSION: Procedure findings as above. Electronically Signed   By: Marcello Moores  Register   On: 06/12/2018 15:08   Dg Abd 1 View  Result Date: 06/12/2018 CLINICAL DATA:  Nasogastric tube placement. EXAM: ABDOMEN - 1 VIEW COMPARISON:  Radiographs earlier the same date.  CT 06/11/2018. FINDINGS: Enteric tube  is visualized within the midline of the upper abdomen, consistent with location in the mid stomach as correlated with recent CT. There is a moderate size hiatal hernia. There is persistent marked dilatation of the transverse colon status post right hemicolectomy. The visualized small bowel also appears mildly dilated. No supine evidence of free intraperitoneal air. Previous left total hip arthroplasty noted.  IMPRESSION: Enteric tube extends to the level of the mid stomach. Persistent findings consistent with distal colonic obstruction. Electronically Signed   By: Richardean Sale M.D.   On: 06/12/2018 13:01   Dg Abd 2 Views  Result Date: 06/13/2018 CLINICAL DATA:  Abdominal pain, diarrhea, colonic obstruction EXAM: ABDOMEN - 2 VIEW COMPARISON:  Abdominal radiograph from earlier today FINDINGS: Enteric tube terminates in distal stomach. Rectal catheter terminates in left deep pelvis. No evidence of pneumatosis or pneumoperitoneum. Persistent marked gaseous distension of remnant large bowel measuring up to 13.4 cm diameter in the proximal colon, not appreciably changed. No disproportionately dilated small bowel loops. No radiopaque nephrolithiasis. Mild bibasilar scarring versus atelectasis. Left total hip arthroplasty. IMPRESSION: 1. Enteric tube terminates in the distal stomach. 2. Rectal catheter terminates in the left deep pelvis probably near the rectosigmoid junction. 3. No appreciable change in marked gaseous distention of the remnant large bowel, most prominent in the proximal remnant colon. No evidence of free air. Electronically Signed   By: Ilona Sorrel M.D.   On: 06/13/2018 10:24   Dg Abd Portable 1v  Result Date: 06/13/2018 CLINICAL DATA:  Followup large bowel obstruction. EXAM: PORTABLE ABDOMEN - 1 VIEW COMPARISON:  06/12/2018 FINDINGS: Nasogastric tube tip in the antrum of the stomach. Small bore drainage catheter inserted per rectum with its tip in the sigmoid region. Dilated postsurgical  proximal colon again demonstrated, maximal diameter 14 cm today, the same as yesterday. Small bowel pattern is unremarkable. IMPRESSION: Sigmoid decompression tube in place. Continued gaseous distension of the postsurgical proximal colon with maximal diameter 14 cm Electronically Signed   By: Nelson Chimes M.D.   On: 06/13/2018 06:58   Dg C-arm 1-60 Min-no Report  Result Date: 06/12/2018 Fluoroscopy was utilized by the requesting physician.  No radiographic interpretation.   [place] Ekg Site Rite  Result Date: 06/13/2018 If Site Rite image not attached, placement could not be confirmed due to current cardiac rhythm.     Jamicia Haaland M 06/14/2018  Patient ID: Kristin [name], female   DOB: 1941-02-23, 77 y.o.   MRN: [id number]

## 2018-06-14 NOTE — Progress Notes (Signed)
Gastroenterology Inpatient Follow-up Note   PATIENT IDENTIFICATION  Kristin [name] is a 77 y.o. female with a pmh significant for Colon CA s/p Rt hemicolectomy, Beast Cancer, Lung Cancer, CRI, HTN, [place] diverticulosis, who presented with SBO/LBO with R/S mass-lesion vs inflammation. Hospital Day: 4  SUBJECTIVE  Patient states she feels better today and that abdomen that is still distended is slight less than yesterday as well. She had a larger diarrheal movement overnight. Rectal tube is still in plan on KUB with decreasing size of colon wall as well. No fevers or chills. PICC In place. Patient kidded with me about wanting some cola today. Patient in good spirits but tired today.   OBJECTIVE  Scheduled Inpatient Medications:  . brimonidine  1 drop Right Eye Q12H   And  . timolol  1 drop Right Eye Q12H  . cycloSPORINE  1 drop Both Eyes BID  . heparin  5,000 Units Subcutaneous Q8H   Continuous Inpatient Infusions:  . sodium chloride Stopped (06/14/18 0514)  . cefTRIAXone (ROCEPHIN)  IV 1 g (06/13/18 2129)  . famotidine (PEPCID) IV 20 mg (06/14/18 1042)  . metronidazole 100 mL/hr at 06/14/18 0600   PRN Inpatient Medications: acetaminophen **OR** acetaminophen, fentaNYL (SUBLIMAZE) injection, hydrALAZINE, menthol-cetylpyridinium, ondansetron (ZOFRAN) IV, phenol, promethazine, sodium chloride flush   Physical Examination  Temp:  [98.1 F (36.7 C)-99.3 F (37.4 C)] 99.3 F (37.4 C) (08/25 0650) Pulse Rate:  [74-77] 74 (08/25 0650) Resp:  [15-16] 16 (08/25 0650) BP: (128-160)/(43-60) 128/43 (08/25 0650) SpO2:  [95 %-97 %] 95 % (08/25 0650) Temp (24hrs), Avg:98.9 F (37.2 C), Min:98.1 F (36.7 C), Max:99.3 F (37.4 C)  Weight: 65.6 kg GEN: NAD, resting comfortably in bed, daughter at bedside PSYCH: Cooperative, without pressured speech EYE: Conjunctivae pink, sclerae anicteric ENT: MMM, NGT in place with evidence of feculent content CV: Non-tachycardic GI: Remains,  protuberant, tympanic to percussion but is less distended than yesterday and very soft, no guarding or rebound present, hyperactive bowel sounds GU: Rectal decompression tube still in place MSK/EXT: No lower extremity edema NEURO:  Alert & Oriented x 3, no focal deficits   Review of Data   Laboratory Studies   Recent Labs  Lab 06/14/18 0335  NA 142  K 3.6  CL 114*  CO2 22  BUN 18  CREATININE 0.99  GLUCOSE 80  CALCIUM 8.6*   Recent Labs  Lab 06/11/18 1856  AST 14*  ALT 11  ALKPHOS 77    Recent Labs  Lab 06/12/18 0418 06/13/18 0519 06/14/18 0335  WBC 6.7 7.3 6.9  HGB 10.0* 9.5* 8.5*  HCT 31.7* 29.5* 26.2*  PLT 468* 430* 381   No results for input(s): APTT, INR in the last 168 hours.  Studies  Dg Abd 1 View  Result Date: 06/14/2018 CLINICAL DATA:  Obstruction of bowel. EXAM: ABDOMEN - 1 VIEW COMPARISON:  Radiograph yesterday at 0900 hour, CT 06/11/2018 FINDINGS: Tip of the enteric tube in the stomach. Rectal catheter in place. No evidence of free air. Persistent marked gaseous distension of bowel in the central lower abdomen currently 12.2 cm, previously 13.4 cm. Air within nondistended distal colon and rectum. Single prominent small bowel loop in the right abdomen. No other small bowel dilatation. Suspect small pleural effusions and basilar atelectasis. Calcified uterine fibroid. IMPRESSION: No significant change in the significantly distended bowel in the central lower abdomen, likely transverse colon. Air within nondistended colon distally with rectal catheter in place. Single prominent air-filled small bowel loop in the right  lower abdomen without other small bowel dilatation. Electronically Signed   By: Jeb Levering M.D.   On: 06/14/2018 04:12   Dg Abd 1 View - Kub  Result Date: 06/12/2018 CLINICAL DATA:  Flexible sigmoidoscopy with bowel decompression. EXAM: ABDOMEN - 1 VIEW COMPARISON:  04/12/2018. FINDINGS: Sigmoid disco, catheter, and guidewire noted in the  rectosigmoid. Eight images obtained. 6 minutes 36 seconds fluoroscopy time utilized. Radiation dose 129.5 mGy. IMPRESSION: Procedure findings as above. Electronically Signed   By: Marcello Moores  Register   On: 06/12/2018 15:08   Dg Abd 2 Views  Result Date: 06/13/2018 CLINICAL DATA:  Abdominal pain, diarrhea, colonic obstruction EXAM: ABDOMEN - 2 VIEW COMPARISON:  Abdominal radiograph from earlier today FINDINGS: Enteric tube terminates in distal stomach. Rectal catheter terminates in left deep pelvis. No evidence of pneumatosis or pneumoperitoneum. Persistent marked gaseous distension of remnant large bowel measuring up to 13.4 cm diameter in the proximal colon, not appreciably changed. No disproportionately dilated small bowel loops. No radiopaque nephrolithiasis. Mild bibasilar scarring versus atelectasis. Left total hip arthroplasty. IMPRESSION: 1. Enteric tube terminates in the distal stomach. 2. Rectal catheter terminates in the left deep pelvis probably near the rectosigmoid junction. 3. No appreciable change in marked gaseous distention of the remnant large bowel, most prominent in the proximal remnant colon. No evidence of free air. Electronically Signed   By: Ilona Sorrel M.D.   On: 06/13/2018 10:24   Dg Abd Portable 1v  Result Date: 06/13/2018 CLINICAL DATA:  Followup large bowel obstruction. EXAM: PORTABLE ABDOMEN - 1 VIEW COMPARISON:  06/12/2018 FINDINGS: Nasogastric tube tip in the antrum of the stomach. Small bore drainage catheter inserted per rectum with its tip in the sigmoid region. Dilated postsurgical proximal colon again demonstrated, maximal diameter 14 cm today, the same as yesterday. Small bowel pattern is unremarkable. IMPRESSION: Sigmoid decompression tube in place. Continued gaseous distension of the postsurgical proximal colon with maximal diameter 14 cm Electronically Signed   By: Nelson Chimes M.D.   On: 06/13/2018 06:58   Dg C-arm 1-60 Min-no Report  Result Date:  06/12/2018 Fluoroscopy was utilized by the requesting physician.  No radiographic interpretation.   [place] Ekg Site Rite  Result Date: 06/13/2018 If Site Rite image not attached, placement could not be confirmed due to current cardiac rhythm.   ASSESSMENT  [name] is a 77 y.o. female with a pmh significant for Colon CA s/p Rt hemicolectomy, Beast Cancer, Lung Cancer, CRI, HTN, [place] diverticulosis, who presented with SBO/LBO with R/S mass-lesion vs inflammation.  The patient is stable from yesterday to today.  She is awaiting her surgery that will likely be mid-week as Plavix washes out.  Endoscopic luminal stenting was felt to be too high risk in setting of active inflammation from presumed diverticulitis.  Hopeful with continued antibiotics and NGT/RT that she opens up and bowel becomes less distended further.  Patient and family thankful for our honest approach and being as proactive as we could.   PLAN/RECOMMENDATIONS  Continue Antibiotics Continue rectal tube to gravity Continue NG tube to low intermittent wall suction Nutrition as per primary service and surgical service via PICC   GI will sign off, but please page/call with questions or concerns.   Justice Britain, MD [facility] Office # [phone]    LOS: 3 days  Irving Copas  06/14/2018, 12:50 PM

## 2018-06-15 ENCOUNTER — Other Ambulatory Visit: Payer: Medicare Other

## 2018-06-15 ENCOUNTER — Encounter (HOSPITAL_COMMUNITY): Payer: Self-pay | Admitting: Gastroenterology

## 2018-06-15 ENCOUNTER — Ambulatory Visit: Payer: Medicare Other | Admitting: Internal Medicine

## 2018-06-15 DIAGNOSIS — K56699 Other intestinal obstruction unspecified as to partial versus complete obstruction: Secondary | ICD-10-CM

## 2018-06-15 DIAGNOSIS — E440 Moderate protein-calorie malnutrition: Secondary | ICD-10-CM

## 2018-06-15 LAB — IRON AND TIBC
Iron: 35 ug/dL (ref 28–170)
Saturation Ratios: 22 % (ref 10.4–31.8)
TIBC: 156 ug/dL — ABNORMAL LOW (ref 250–450)
UIBC: 121 ug/dL

## 2018-06-15 LAB — CBC WITH DIFFERENTIAL/PLATELET
BASOS ABS: 0 10*3/uL (ref 0.0–0.1)
Basophils Relative: 0 %
EOS PCT: 4 %
Eosinophils Absolute: 0.3 10*3/uL (ref 0.0–0.7)
HCT: 26.5 % — ABNORMAL LOW (ref 36.0–46.0)
Hemoglobin: 8.6 g/dL — ABNORMAL LOW (ref 12.0–15.0)
LYMPHS ABS: 1 10*3/uL (ref 0.7–4.0)
LYMPHS PCT: 14 %
MCH: 29.5 pg (ref 26.0–34.0)
MCHC: 32.5 g/dL (ref 30.0–36.0)
MCV: 90.8 fL (ref 78.0–100.0)
MONO ABS: 1 10*3/uL (ref 0.1–1.0)
Monocytes Relative: 14 %
Neutro Abs: 4.9 10*3/uL (ref 1.7–7.7)
Neutrophils Relative %: 68 %
PLATELETS: 396 10*3/uL (ref 150–400)
RBC: 2.92 MIL/uL — ABNORMAL LOW (ref 3.87–5.11)
RDW: 13.5 % (ref 11.5–15.5)
WBC: 7.1 10*3/uL (ref 4.0–10.5)

## 2018-06-15 LAB — BASIC METABOLIC PANEL
Anion gap: 7 (ref 5–15)
BUN: 15 mg/dL (ref 8–23)
CALCIUM: 8.8 mg/dL — AB (ref 8.9–10.3)
CO2: 24 mmol/L (ref 22–32)
CREATININE: 0.91 mg/dL (ref 0.44–1.00)
Chloride: 113 mmol/L — ABNORMAL HIGH (ref 98–111)
GFR calc Af Amer: >60 mL/min (ref >60)
GFR, EST NON AFRICAN AMERICAN: 58 mL/min — AB (ref >60)
GLUCOSE: 106 mg/dL — AB (ref 70–99)
Potassium: 3.2 mmol/L — ABNORMAL LOW (ref 3.5–5.1)
Sodium: 144 mmol/L (ref 135–145)

## 2018-06-15 LAB — FERRITIN: Ferritin: 84 ng/mL (ref 11–307)

## 2018-06-15 LAB — GLUCOSE, CAPILLARY: Glucose-Capillary: 120 mg/dL — ABNORMAL HIGH (ref 70–99)

## 2018-06-15 LAB — SURGICAL PCR SCREEN
MRSA, PCR: NEGATIVE
Staphylococcus aureus: NEGATIVE

## 2018-06-15 LAB — HEMOGLOBIN A1C
Hgb A1c MFr Bld: 5.2 % (ref 4.8–5.6)
MEAN PLASMA GLUCOSE: 102.54 mg/dL

## 2018-06-15 MED ORDER — HYDRALAZINE HCL 20 MG/ML IJ SOLN: 5.0000 mg | INTRAMUSCULAR | Status: DC | PRN

## 2018-06-15 MED ORDER — SODIUM CHLORIDE 0.9 % IV SOLN
25.0000 mg | Freq: Four times a day (QID) | INTRAVENOUS | Status: DC | PRN
  Filled 2018-06-15: qty 1

## 2018-06-15 MED ORDER — BUPIVACAINE LIPOSOME 1.3 % IJ SUSP
20.0000 mL | Freq: Once | INTRAMUSCULAR | Status: DC
  Filled 2018-06-15: qty 20

## 2018-06-15 MED ORDER — DEXTROSE-NACL 5-0.9 % IV SOLN
INTRAVENOUS | Status: AC
  Administered 2018-06-15: 18:00:00 via INTRAVENOUS

## 2018-06-15 MED ORDER — LIP MEDEX EX OINT
1.0000 "application " | TOPICAL_OINTMENT | Freq: Two times a day (BID) | CUTANEOUS | Status: DC
  Administered 2018-06-15 – 2018-06-25 (×18): 1 via TOPICAL
  Filled 2018-06-15 (×2): qty 7

## 2018-06-15 MED ORDER — METOPROLOL TARTRATE 5 MG/5ML IV SOLN: 5.0000 mg | Freq: Four times a day (QID) | INTRAVENOUS | Status: DC | PRN

## 2018-06-15 MED ORDER — GUAIFENESIN-DM 100-10 MG/5ML PO SYRP
10.0000 mL | ORAL_SOLUTION | ORAL | Status: DC | PRN
  Administered 2018-06-22 – 2018-06-24 (×3): 10 mL via ORAL
  Filled 2018-06-15 (×3): qty 10

## 2018-06-15 MED ORDER — ALVIMOPAN 12 MG PO CAPS
12.0000 mg | ORAL_CAPSULE | ORAL | Status: AC
  Administered 2018-06-16: 12 mg via ORAL
  Filled 2018-06-15: qty 1

## 2018-06-15 MED ORDER — HYDROCORTISONE 2.5 % RE CREA
1.0000 "application " | TOPICAL_CREAM | Freq: Four times a day (QID) | RECTAL | Status: DC | PRN
  Filled 2018-06-15: qty 28.35

## 2018-06-15 MED ORDER — PHENOL 1.4 % MT LIQD
1.0000 | OROMUCOSAL | Status: DC | PRN
  Filled 2018-06-15: qty 177

## 2018-06-15 MED ORDER — METOCLOPRAMIDE HCL 5 MG/ML IJ SOLN
5.0000 mg | Freq: Three times a day (TID) | INTRAMUSCULAR | Status: DC | PRN
  Administered 2018-06-17 (×2): 10 mg via INTRAVENOUS
  Filled 2018-06-15 (×2): qty 2

## 2018-06-15 MED ORDER — FAT EMULSION PLANT BASED 20 % IV EMUL
240.0000 mL | INTRAVENOUS | Status: AC
  Administered 2018-06-15: 240 mL via INTRAVENOUS
  Filled 2018-06-15: qty 250

## 2018-06-15 MED ORDER — BISACODYL 10 MG RE SUPP: 10.0000 mg | Freq: Two times a day (BID) | RECTAL | Status: DC | PRN

## 2018-06-15 MED ORDER — CHLORHEXIDINE GLUCONATE 4 % EX LIQD
60.0000 mL | Freq: Once | CUTANEOUS | Status: AC
  Administered 2018-06-16: 4 via TOPICAL
  Filled 2018-06-15: qty 60

## 2018-06-15 MED ORDER — SODIUM CHLORIDE 0.9 % IV SOLN
INTRAVENOUS | Status: DC | PRN
  Administered 2018-06-15: 250 mL via INTRAVENOUS
  Administered 2018-06-18: 10 mL via INTRAVENOUS

## 2018-06-15 MED ORDER — TRACE MINERALS CR-CU-MN-SE-ZN 10-1000-500-60 MCG/ML IV SOLN
INTRAVENOUS | Status: AC
  Administered 2018-06-15: 19:00:00 via INTRAVENOUS
  Filled 2018-06-15: qty 960

## 2018-06-15 MED ORDER — SODIUM CHLORIDE 0.9 % IV SOLN
INTRAVENOUS | Status: DC
  Filled 2018-06-15: qty 6

## 2018-06-15 MED ORDER — LACTATED RINGERS IV BOLUS: 1000.0000 mL | Freq: Three times a day (TID) | INTRAVENOUS | Status: DC | PRN

## 2018-06-15 MED ORDER — INSULIN ASPART 100 UNIT/ML ~~LOC~~ SOLN
0.0000 [IU] | Freq: Four times a day (QID) | SUBCUTANEOUS | Status: DC
  Administered 2018-06-16 (×2): 1 [IU] via SUBCUTANEOUS
  Administered 2018-06-16: 2 [IU] via SUBCUTANEOUS
  Administered 2018-06-17: 1 [IU] via SUBCUTANEOUS
  Administered 2018-06-17 (×2): 2 [IU] via SUBCUTANEOUS
  Administered 2018-06-18 – 2018-06-24 (×14): 1 [IU] via SUBCUTANEOUS

## 2018-06-15 MED ORDER — CHLORHEXIDINE GLUCONATE 4 % EX LIQD
60.0000 mL | Freq: Once | CUTANEOUS | Status: AC
  Administered 2018-06-15: 4 via TOPICAL
  Filled 2018-06-15 (×2): qty 60

## 2018-06-15 MED ORDER — HYDROCORTISONE 1 % EX CREA
1.0000 "application " | TOPICAL_CREAM | Freq: Three times a day (TID) | CUTANEOUS | Status: DC | PRN
  Filled 2018-06-15: qty 28

## 2018-06-15 MED ORDER — ALUM & MAG HYDROXIDE-SIMETH 200-200-20 MG/5ML PO SUSP
30.0000 mL | Freq: Four times a day (QID) | ORAL | Status: DC | PRN
  Administered 2018-06-16 – 2018-06-25 (×8): 30 mL via ORAL
  Filled 2018-06-15 (×8): qty 30

## 2018-06-15 MED ORDER — POTASSIUM CHLORIDE 10 MEQ/100ML IV SOLN
10.0000 meq | INTRAVENOUS | Status: AC
  Administered 2018-06-15 (×4): 10 meq via INTRAVENOUS
  Filled 2018-06-15 (×4): qty 100

## 2018-06-15 MED ORDER — SODIUM CHLORIDE 0.9 % IV SOLN
2.0000 g | INTRAVENOUS | Status: AC
  Administered 2018-06-16: 2 g via INTRAVENOUS
  Filled 2018-06-15: qty 2

## 2018-06-15 MED ORDER — MAGIC MOUTHWASH
15.0000 mL | Freq: Four times a day (QID) | ORAL | Status: DC | PRN
  Filled 2018-06-15: qty 15

## 2018-06-15 NOTE — H&P (View-Only) (Signed)
Kristin [name] [id number] 1941/04/16  CARE TEAM:  PCP: Eber Hong, MD  Outpatient Care Team: Patient Care Team: Eber Hong, MD as PCP - General (Internal Medicine) Michael Boston, MD as Consulting Physician (General Surgery) Armbruster, Carlota Raspberry, MD as Consulting Physician (Gastroenterology) Curt Bears, MD as Consulting Physician (Oncology) Herminio Commons, MD as Consulting Physician (Cardiology)  Inpatient Treatment Team: Treatment Team: Attending Provider: Alma Friendly, MD; Consulting Physician: Nolon Nations, MD; Consulting Physician: Curt Bears, MD; Rounding Team: Fatima Blank, MD; Technician: Abbe Amsterdam, NT; Registered Nurse: Charlyne Petrin, RN; Case Manager: Lynnell Catalan, RN   Problem List:   Principal Problem:   Stricture of sigmoid colon wih obstruction Active Problems:   Cancer of upper lobe of left lung [facility])   Personal history of breast cancer   Hypertension   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  Most likely benign ischemic versus diverticular etiology  Plan:  Assessment & Plan:   FLEXIBLE SIGMOIDOSCOPY,BOWEL [place] 32 F tube - [place] - 06/12/2018 WBC - 7,300 - 06/13/2018 NG decompression   Some evacuation but probable persistent colonic distention.  Will get x-ray films today.  Plan laparoscopically assisted colectomy tomorrow.  If well decompressed, we will try a primary anastomosis.  Low threshold to do an colostomy versus diverting loop ileostomy still very dilated to minimize risk of anastomotic leak with significant morbidity.  Will be off her Plavix  for 5 days.  Given prior right colectomy, may have more adhesions.  We will see.  The anatomy & physiology of the digestive tract was discussed.  The pathophysiology of the colon was discussed.  Natural history risks without surgery was discussed.   I feel the risks of no intervention will lead to serious problems that outweigh the operative risks; therefore, I recommended a partial colectomy to remove the pathology.  Minimally invasive (Robotic/Laparoscopic) & open techniques were discussed.   Risks such as bleeding, infection, abscess, leak, reoperation, injury to other organs, need for repair of tissues / organs, possible ostomy, hernia, heart attack, stroke, death, and other risks were discussed.  I noted a good likelihood this will help address the problem.   Goals of post-operative recovery were discussed as well.   Need for adequate nutrition, daily bowel regimen and healthy physical activity, to optimize recovery was noted as well. We will work to minimize complications.  Educational materials were available as well.  Questions were answered.  The patient expresses understanding & wishes to proceed with surgery.   PICC line placement             TNA reordered   S/P right colectomy - May 2017 - Dr. Johney Maine for T2, N0 adenoca.  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 Julien Nordmann  Plavix - last dose 8/22.  Will be 5 days tomorrow For CVA  Appreciate GI involvement, recommendations, and management.  30 minutes spent in review, evaluation, examination, counseling, and coordination of care.  More than 50% of that time was spent in counseling.  06/15/2018    Subjective: (Chief complaint)  Getting tired of NG tube but being compliant.  Husband  and daughter at bedside.  Some crampy abdominal pain.  Less diarrhea but still with some  flatus.  Objective:  Vital signs:  Vitals:   06/14/18 0650 06/14/18 1352 06/14/18 2200 06/15/18 0503  BP: (!) 128/43 (!) 155/54 (!) 147/67 (!) 146/53  Pulse: 74 75 78 70  Resp: _0 Temp: 99.3 F (37.4 C) 98.7 F (37.1 C) 98.6 F (37 C) 99.7 F (37.6 C)  TempSrc: Oral Oral Oral Oral  SpO2: 95% 99% 98% 96%  Weight:      Height:        Last BM Date: 06/14/18  Intake/Output   Yesterday:  08/25 0701 - 08/26 0700 In: 2339.8 [P.O.:120; I.V.:941.3; IV Piggyback:1278.5] Out: 1200 [Emesis/NG output:1200] This shift:  No intake/output data recorded.  Bowel function:  Flatus: YES  BM:  YES  Drain: Feculent   Physical Exam:  General: Pt awake/alert/oriented x4 in no acute distress Eyes: PERRL, normal EOM.  Sclera clear.  No icterus Neuro: CN II-XII intact w/o focal sensory/motor deficits. Lymph: No head/neck/groin lymphadenopathy Psych:  No delerium/psychosis/paranoia HENT: Normocephalic, Mucus membranes moist.  No thrush Neck: Supple, No tracheal deviation Chest: No chest wall pain w good excursion CV:  Pulses intact.  Regular rhythm MS: Normal AROM mjr joints.  No obvious deformity  Abdomen: Soft.  Mildy distended.  Nontender.  Lot of borborygmi/gurgling.  No evidence of peritonitis.  No incarcerated hernias.  Ext:   No deformity.  No mjr edema.  No cyanosis Skin: No petechiae / purpura  Results:   Labs: Results for orders placed or performed during the hospital encounter of 06/11/18 (from the past 48 hour(s))  Urine Culture     Status: None   Collection Time: 06/13/18  3:30 PM  Result Value Ref Range   Specimen Description      URINE, RANDOM Performed at [place] [address] [place], [place] [address]    Special Requests      NONE Performed at [facility], [place] [address] [place], [place] [address]    Culture      NO GROWTH Performed at [facility], [place] [address] [place], [place]  [address]    Report Status 06/14/2018 FINAL   CBC with Differential/Platelet     Status: Abnormal   Collection Time: 06/14/18  3:35 AM  Result Value Ref Range   WBC 6.9 4.0 - 10.5 K/uL   RBC 2.83 (L) 3.87 - 5.11 MIL/uL   Hemoglobin 8.5 (L) 12.0 - 15.0 g/dL   HCT 26.2 (L) 36.0 - 46.0 %   MCV 92.6 78.0 - 100.0 fL   MCH 30.0 26.0 - 34.0 pg   MCHC 32.4 30.0 - 36.0 g/dL   RDW 13.5 11.5 - 15.5 %   Platelets 381 150 - 400 K/uL   Neutrophils Relative % 70 %   Neutro Abs 4.8 1.7 - 7.7 K/uL   Lymphocytes Relative 14 %   Lymphs Abs 0.9 0.7 - 4.0 K/uL   Monocytes Relative 15 %   Monocytes Absolute 1.1 (H) 0.1 - 1.0 K/uL   Eosinophils Relative 1 %   Eosinophils Absolute 0.1 0.0 - 0.7 K/uL   Basophils Relative 0 %   Basophils Absolute 0.0 0.0 - 0.1 K/uL    Comment: Performed at [facility], [place] [address], [place], [place] [address]  Basic metabolic panel     Status: Abnormal   Collection Time: 06/14/18  3:35 AM  Result Value  Ref Range   Sodium 142 135 - 145 mmol/L   Potassium 3.6 3.5 - 5.1 mmol/L    Comment: NO VISIBLE HEMOLYSIS DELTA CHECK NOTED    Chloride 114 (H) 98 - 111 mmol/L   CO2 22 22 - 32 mmol/L   Glucose, Bld 80 70 - 99 mg/dL   BUN 18 8 - 23 mg/dL   Creatinine, Ser 0.99 0.44 - 1.00 mg/dL   Calcium 8.6 (L) 8.9 - 10.3 mg/dL   GFR calc non Af Amer 53 (L) >60 mL/min   GFR calc Af Amer >60 >60 mL/min    Comment: (NOTE) The eGFR has been calculated using the CKD EPI equation. This calculation has not been validated in all clinical situations. eGFR's persistently <60 mL/min signify possible Chronic Kidney Disease.    Anion gap 6 5 - 15    Comment: Performed at [facility], [place] [address], [place], [place] [address]  CBC with Differential/Platelet     Status: Abnormal   Collection Time: 06/15/18  3:28 AM  Result Value Ref Range   WBC 7.1 4.0 - 10.5 K/uL   RBC 2.92 (L) 3.87 - 5.11 MIL/uL   Hemoglobin 8.6 (L) 12.0 - 15.0 g/dL   HCT 26.5  (L) 36.0 - 46.0 %   MCV 90.8 78.0 - 100.0 fL   MCH 29.5 26.0 - 34.0 pg   MCHC 32.5 30.0 - 36.0 g/dL   RDW 13.5 11.5 - 15.5 %   Platelets 396 150 - 400 K/uL   Neutrophils Relative % 68 %   Neutro Abs 4.9 1.7 - 7.7 K/uL   Lymphocytes Relative 14 %   Lymphs Abs 1.0 0.7 - 4.0 K/uL   Monocytes Relative 14 %   Monocytes Absolute 1.0 0.1 - 1.0 K/uL   Eosinophils Relative 4 %   Eosinophils Absolute 0.3 0.0 - 0.7 K/uL   Basophils Relative 0 %   Basophils Absolute 0.0 0.0 - 0.1 K/uL    Comment: Performed at [facility], [place] [address] [place], [place] [address]  Basic metabolic panel     Status: Abnormal   Collection Time: 06/15/18  3:28 AM  Result Value Ref Range   Sodium 144 135 - 145 mmol/L   Potassium 3.2 (L) 3.5 - 5.1 mmol/L   Chloride 113 (H) 98 - 111 mmol/L   CO2 24 22 - 32 mmol/L   Glucose, Bld 106 (H) 70 - 99 mg/dL   BUN 15 8 - 23 mg/dL   Creatinine, Ser 0.91 0.44 - 1.00 mg/dL   Calcium 8.8 (L) 8.9 - 10.3 mg/dL   GFR calc non Af Amer 58 (L) >60 mL/min   GFR calc Af Amer >60 >60 mL/min    Comment: (NOTE) The eGFR has been calculated using the CKD EPI equation. This calculation has not been validated in all clinical situations. eGFR's persistently <60 mL/min signify possible Chronic Kidney Disease.    Anion gap 7 5 - 15    Comment: Performed at [facility], [place] [address] [place], [place] [address]  Iron and TIBC     Status: Abnormal   Collection Time: 06/15/18  3:28 AM  Result Value Ref Range   Iron 35 28 - 170 ug/dL   TIBC 156 (L) 250 - 450 ug/dL   Saturation Ratios 22 10.4 - 31.8 %   UIBC 121 ug/dL    Comment: Performed at [facility], [place] [address], [place], [place] [address]  Ferritin  Status: None   Collection Time: 06/15/18  3:28 AM  Result Value Ref Range   Ferritin 84 11 - 307 ng/mL    Comment: Performed at [facility], [place] [address], [place], [place] [address]    Imaging  / Studies: Dg Abd 1 View  Result Date: 06/14/2018 CLINICAL DATA:  Obstruction of bowel. EXAM: ABDOMEN - 1 VIEW COMPARISON:  Radiograph yesterday at 0900 hour, CT 06/11/2018 FINDINGS: Tip of the enteric tube in the stomach. Rectal catheter in place. No evidence of free air. Persistent marked gaseous distension of bowel in the central lower abdomen currently 12.2 cm, previously 13.4 cm. Air within nondistended distal colon and rectum. Single prominent small bowel loop in the right abdomen. No other small bowel dilatation. Suspect small pleural effusions and basilar atelectasis. Calcified uterine fibroid. IMPRESSION: No significant change in the significantly distended bowel in the central lower abdomen, likely transverse colon. Air within nondistended colon distally with rectal catheter in place. Single prominent air-filled small bowel loop in the right lower abdomen without other small bowel dilatation. Electronically Signed   By: Jeb Levering M.D.   On: 06/14/2018 04:12   [place] Ekg Site Rite  Result Date: 06/13/2018 If Site Rite image not attached, placement could not be confirmed due to current cardiac rhythm.   Medications / Allergies: per chart  Antibiotics: Anti-infectives (From admission, onward)   Start     Dose/Rate Route Frequency Ordered Stop   06/12/18 1700  metroNIDAZOLE (FLAGYL) IVPB 500 mg     500 mg 100 mL/hr over 60 Minutes Intravenous Every 8 hours 06/12/18 1604     06/11/18 2245  cefTRIAXone (ROCEPHIN) 1 g in sodium chloride 0.9 % 100 mL IVPB     1 g 200 mL/hr over 30 Minutes Intravenous Daily at bedtime 06/11/18 2204          Note: Portions of this report may have been transcribed using voice recognition software. Every effort was made to ensure accuracy; however, inadvertent computerized transcription errors may be present.   Any transcriptional errors that result from this process are unintentional.     [name], MD, FACS, MASCRS Gastrointestinal and  Minimally Invasive Surgery    [address] [place], [place] [address] [phone] Main / Paging [phone] Fax

## 2018-06-15 NOTE — Progress Notes (Signed)
25cc of output emptied from rectal tube.

## 2018-06-15 NOTE — Progress Notes (Signed)
PROGRESS NOTE  Kristin [name] GNO:[id number] DOB: 25-Jul-1941 DOA: 06/11/2018 PCP: Eber Hong, MD  HPI/Recap of past 24 hours: Kristin [name] is a 77 y.o. 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 Patient also reports some urinary symptoms and was recently prescribed Keflex. In the ED, CT of the abdomen and pelvis reveals a short segment of thickening in the rectosigmoid colon causing obstruction. UA suggestive of infection. Surgery consulted. Pt admitted for further management.   Today, pt reported abdominal pain is better, less distended. Denies any fever/chills. 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   CKD (chronic kidney disease), stage III ([facility])   Large bowel obstruction ([facility])   Acute lower UTI  Large bowel obstruction  CT abd/pelvis showed thickening involving a short segment of rectosigmoid colon causing obstruction  Repeat Abd xray with no sig change Surgery on board: Plan for laparoscopically assisted colectomy on 06/16/18 GI on board: Had sigmoidoscopy on 06/12/18, with rectal tube placement for decompression Continue NGT, rectal tube, NPO, IVF hydration, analgesia, antiemetics Continue Flagyl, IV rocephin TPN started by Gen Surg  Hypokalemia Replace prn  UTI  Afebrile, no leukocytosis  UA with mod leukocytes, WBC 11-20  UC showed no growth  Anemia of chronic disease (Hx of malignancy/CKD) Hgb baseline around  10 Currently likely worsened by hemodilution Check iron panel WNL except for low TIBC Daily CBC  History of colon/lung/breast cancer CT C/A/P last month with no concerning findings for disease progression per oncology notes Oncology outpatient follow up  Hypertension  Hydralazine IV prn for now while NPO  Hold home lisinopril  CKD stage III  Cr at baseline     Code Status: Full  Family Communication: Daughter and husband at bedside  Disposition Plan: Once work up complete, with significant improvement   Consultants:  Gen surg  GI   Procedures:  Sigmoidoscopy on 06/12/18  Antimicrobials:  IV ceftriaxone   IV Flagyl   DVT prophylaxis: Heparin Frederic   Objective: Vitals:   06/14/18 0650 06/14/18 1352 06/14/18 2200 06/15/18 0503  BP: (!) 128/43 (!) 155/54 (!) 147/67 (!) 146/53  Pulse: 74 75 78 70  Resp: 16 16 18 18   Temp: 99.3 F (37.4 C) 98.7 F (37.1 C) 98.6 F (37 C) 99.7 F (37.6 C)  TempSrc: Oral Oral Oral Oral  SpO2: 95% 99% 98% 96%  Weight:      Height:        Intake/Output Summary (Last 24 hours) at 06/15/2018 1328 Last data filed at 06/15/2018 1100 Gross per 24 hour  Intake 2847.72 ml  Output 1150 ml  Net 1697.72 ml   Filed Weights   06/12/18 0500 06/12/18 1250 06/12/18 1727  Weight: 63.5 kg 63.5 kg 65.6 kg    Exam:   General: NAD  Cardiovascular: S1, S2 present  Respiratory: CTAB  Abdomen: Soft, less distended, generalized tenderness, tympanic bowel sounds  Musculoskeletal: No pedal edema bilaterally  Skin: Normal  Psychiatry: Normal mood   Data Reviewed: CBC: Recent Labs  Lab 06/11/18 1856 06/12/18 0418 06/13/18 0519 06/14/18 0335 06/15/18 0328  WBC 8.9 6.7 7.3 6.9 7.1  NEUTROABS 6.8  --  5.2 4.8 4.9  HGB 11.9* 10.0* 9.5* 8.5* 8.6*  HCT 37.3 31.7* 29.5* 26.2* 26.5*  MCV 91.9 92.2 92.8 92.6 90.8  PLT 522* 468* 430* 381 932   Basic Metabolic Panel: Recent Labs  Lab 06/11/18 1856 06/12/18 0418  06/13/18 0519 06/14/18 0335 06/15/18 0328  NA 138 142 141 142 144  K 4.6 5.1 4.4 3.6 3.2*  CL 108 114* 114* 114* 113*  CO2 23 23 23 22 24   GLUCOSE 146* 114* 98 80 106*  BUN 27* 25* 20 18 15   CREATININE 1.51* 1.32* 1.15* 0.99 0.91  CALCIUM 9.5 9.1 9.0 8.6* 8.8*   GFR: Estimated Creatinine Clearance: 47.9 mL/min (by C-G formula based on SCr of 0.91 mg/dL). Liver Function Tests: Recent Labs  Lab 06/11/18 1856  AST 14*  ALT 11  ALKPHOS 77  BILITOT 0.3  PROT 7.0  ALBUMIN 3.2*   Recent Labs  Lab 06/11/18 1856  LIPASE 55*   No results for input(s): AMMONIA in the last 168 hours. Coagulation Profile: No results for input(s): INR, PROTIME in the last 168 hours. Cardiac Enzymes: No results for input(s): CKTOTAL, CKMB, CKMBINDEX, TROPONINI in the last 168 hours. BNP (last 3 results) No results for input(s): PROBNP in the last 8760 hours. HbA1C: No results for input(s): HGBA1C in the last 72 hours. CBG: No results for input(s): GLUCAP in the last 168 hours. Lipid Profile: No results for input(s): CHOL, HDL, LDLCALC, TRIG, CHOLHDL, LDLDIRECT in the last 72 hours. Thyroid Function Tests: No results for input(s): TSH, T4TOTAL, FREET4, T3FREE, THYROIDAB in the last 72 hours. Anemia Panel: Recent Labs    06/15/18 0328  FERRITIN 84  TIBC 156*  IRON 35   Urine analysis:    Component Value Date/Time   COLORURINE YELLOW 06/11/2018 1801   APPEARANCEUR CLEAR 06/11/2018 1801   LABSPEC 1.024 06/11/2018 1801   PHURINE 5.0 06/11/2018 1801   GLUCOSEU NEGATIVE 06/11/2018 1801   HGBUR NEGATIVE 06/11/2018 1801   BILIRUBINUR NEGATIVE 06/11/2018 1801   KETONESUR NEGATIVE 06/11/2018 1801   PROTEINUR NEGATIVE 06/11/2018 1801   UROBILINOGEN 0.2 04/29/2013 2243   NITRITE NEGATIVE 06/11/2018 1801   LEUKOCYTESUR MODERATE (A) 06/11/2018 1801   Sepsis Labs: @LABRCNTIP (procalcitonin:4,lacticidven:4)  ) Recent Results (from the past 240 hour(s))  Urine Culture     Status: None    Collection Time: 06/13/18  3:30 PM  Result Value Ref Range Status   Specimen Description   Final    URINE, RANDOM Performed at [facility], [place] [address], [place], [place] [address]    Special Requests   Final    NONE Performed at [facility], [place] [address], [place], [place] [address]    Culture   Final    NO GROWTH Performed at [facility], [place] [address], [place], [place] [address]    Report Status 06/14/2018 FINAL  Final      Studies: No results found.  Scheduled Meds: . [START ON 06/16/2018] alvimopan  12 mg Oral On Call to OR  . brimonidine  1 drop Right Eye Q12H   And  . timolol  1 drop Right Eye Q12H  . [START ON 06/16/2018] bupivacaine liposome  20 mL Infiltration Once  . chlorhexidine  60 mL Topical Once   And  . [START ON 06/16/2018] chlorhexidine  60 mL Topical Once  . [  START ON 06/16/2018] clindamycin / gentamicin INTRAPERITONEAL Lavage irrigation   Intraperitoneal To OR  . cycloSPORINE  1 drop Both Eyes BID  . heparin  5,000 Units Subcutaneous Q8H  . insulin aspart  0-9 Units Subcutaneous Q6H  . lip balm  1 application Topical BID    Continuous Infusions: . sodium chloride 250 mL (06/15/18 0850)  . [START ON 06/16/2018] cefoTEtan (CEFOTAN) IV    . chlorproMAZINE (THORAZINE) IV    . dextrose 5 % and 0.45% NaCl 75 mL/hr at 06/15/18 0618  . dextrose 5 % and 0.9% NaCl    . famotidine (PEPCID) IV 20 mg (06/15/18 1003)  . Marland KitchenTPN (CLINIMIX-E) Adult     And  . Fat emulsion    . lactated ringers    . metronidazole 500 mg (06/15/18 1320)     LOS: 4 days     Alma Friendly, MD [facility]   If 7PM-7AM, please contact night-coverage [email or web address] Password [email or web address] 06/15/2018, 1:28 PM

## 2018-06-15 NOTE — Progress Notes (Signed)
PHARMACY - ADULT TOTAL PARENTERAL NUTRITION CONSULT NOTE   Pharmacy Consult for TPN Indication: bowel obstruction  Patient Measurements: Height: 5' 6.5" (168.9 cm) Weight: 144 lb 11.2 oz (65.6 kg) IBW/kg (Calculated) : 60.45 TPN AdjBW (KG): 63.5 Body mass index is 23.01 kg/m. Usual Weight:   Assessment:  Pharmacy is consulted to start TPN on pt with bowel obstruction. PMH includes adenocarcinoma of  ascending colon status post right colectomy in 02/2016, breast cancer, lung cancer, chronic kidney disease stage III, and hypertension.  Insulin Requirements: None, no history of DM   Current Nutrition: NPO   IVF: D5-NS at 75 ml/hr   Central access: 8/24 TPN start date: 8/26  ASSESSMENT                                                                                                          HPI: Pt with successful rectal tube decompression placement into the dilated sigmoid colon on 8/23.  Significant events:  8/26 start TPN  Today:    Glucose - controlled  Electrolytes - K+ 3.2,   Renal - Scr 0.91, CorrCa is WNL, all others WNL  LFTs - WNL ( 8/22)   TGs - pending   Prealbumin - pending   NUTRITIONAL GOALS                                                                                             RD recs: pending   Clinimix E 5/15 at a goal rate of 83 ml/hr + 20% fat emulsion at 10 ml/hr to provide: 100 g/day protein, 1894 Kcal/day.  PLAN                    KCl 10 meq IV x 4 ( ordered by MD)                                                                                                         At 1800 today:  Start Clinimix E  5/15 at 40 ml/hr.  20% fat emulsion at 20 ml/hr.  Plan to advance as tolerated to the goal rate.  TPN to contain standard multivitamins and trace elements.  Reduce IVF to 35 ml/hr.  Add sensitive SSI q6h.   TPN lab panels  on Mondays & Thursdays.  F/u daily.  Royetta Asal, PharmD, BCPS Pager [phone] 06/15/2018 11:56  AM

## 2018-06-15 NOTE — Progress Notes (Signed)
Initial Nutrition Assessment  DOCUMENTATION CODES:   Non-severe (moderate) malnutrition in context of acute illness/injury  INTERVENTION:   TPN per Pharmacy Will monitor for diet advancements  NUTRITION DIAGNOSIS:   Moderate Malnutrition related to acute illness(large bowel obstruction) as evidenced by moderate fat depletion, moderate muscle depletion, energy intake < 75% for > 7 days.  GOAL:   Patient will meet greater than or equal to 90% of their needs  MONITOR:   Diet advancement, Labs, Weight trends, I & O's(TPN)  REASON FOR ASSESSMENT:   Consult New TPN/TNA  ASSESSMENT:   77 y.o. 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  Patient in room with daughter at bedside. Pt's daughter reports last time patient consumed solid food was at a barbecue on 8/18 where the patient ate a hamburger. Pt states the burger did not stay down. Pt has not eaten much for 8 days, mainly subsisting on mashed potatoes and broth. Pt states she had crackers, applesauce and pears on 8/22 and that was the last time she ate.   Pt reports still feeling some nausea. NGT in place: output 650 ml so far today.  Per surgery note, pt may have lap colectomy tomorrow 8/27.  TPN to begin today at 1800. Clinimix E  5/15 at 40 ml/hr and 20% fat emulsion at 20 ml/hr. Patient and pt's daughter eager to have nutrition status improved.  Per weight records, weights have remained stable.   Medications: D5 -.45% NaCl infusion @ 75 ml/hr -providing 306 kcal, IV Zofran PRN Labs reviewed: Low K   NUTRITION - FOCUSED PHYSICAL EXAM:    Most Recent Value  Orbital Region  No depletion  Upper Arm Region  Moderate depletion   Thoracic and Lumbar Region  Unable to assess  Buccal Region  Mild depletion  Temple Region  Mild depletion  Clavicle Bone Region  Moderate depletion  Clavicle and Acromion Bone Region  Moderate depletion  Scapular Bone Region  Unable to assess  Dorsal Hand  No depletion  Patellar Region  Unable to assess  Anterior Thigh Region  Unable to assess  Posterior Calf Region  Unable to assess  Edema (RD Assessment)  None       Diet Order:   Diet Order            Diet NPO time specified Except for: [employer], Sips with Meds  Diet effective now              EDUCATION NEEDS:   Education needs have been addressed  Skin:  Skin Assessment: Reviewed RN Assessment  Last BM:  8/25  Height:   Ht Readings from Last 1 Encounters:  06/12/18 5' 6.5" (1.689 m)    Weight:   Wt Readings from Last 1 Encounters:  06/12/18 65.6 kg    Ideal Body Weight:  67.7 kg  BMI:  Body mass index is 23.01 kg/m.  Estimated Nutritional Needs:   Kcal:  1800-2000  Protein:  80-90g  Fluid:  2L/day  Clayton Bibles, MS, RD, LDN [place] Dietitian Pager: [phone] After Hours Pager: [phone]

## 2018-06-15 NOTE — Progress Notes (Signed)
Kristin [name] [id number] 1941/04/16  CARE TEAM:  PCP: Eber Hong, MD  Outpatient Care Team: Patient Care Team: Eber Hong, MD as PCP - General (Internal Medicine) Michael Boston, MD as Consulting Physician (General Surgery) Armbruster, Carlota Raspberry, MD as Consulting Physician (Gastroenterology) Curt Bears, MD as Consulting Physician (Oncology) Herminio Commons, MD as Consulting Physician (Cardiology)  Inpatient Treatment Team: Treatment Team: Attending Provider: Alma Friendly, MD; Consulting Physician: Nolon Nations, MD; Consulting Physician: Curt Bears, MD; Rounding Team: Fatima Blank, MD; Technician: Abbe Amsterdam, NT; Registered Nurse: Charlyne Petrin, RN; Case Manager: Lynnell Catalan, RN   Problem List:   Principal Problem:   Stricture of sigmoid colon wih obstruction Active Problems:   Cancer of upper lobe of left lung [facility])   Personal history of breast cancer   Hypertension   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  Most likely benign ischemic versus diverticular etiology  Plan:  Assessment & Plan:   FLEXIBLE SIGMOIDOSCOPY,BOWEL [place] 32 F tube - [place] - 06/12/2018 WBC - 7,300 - 06/13/2018 NG decompression   Some evacuation but probable persistent colonic distention.  Will get x-ray films today.  Plan laparoscopically assisted colectomy tomorrow.  If well decompressed, we will try a primary anastomosis.  Low threshold to do an colostomy versus diverting loop ileostomy still very dilated to minimize risk of anastomotic leak with significant morbidity.  Will be off her Plavix  for 5 days.  Given prior right colectomy, may have more adhesions.  We will see.  The anatomy & physiology of the digestive tract was discussed.  The pathophysiology of the colon was discussed.  Natural history risks without surgery was discussed.   I feel the risks of no intervention will lead to serious problems that outweigh the operative risks; therefore, I recommended a partial colectomy to remove the pathology.  Minimally invasive (Robotic/Laparoscopic) & open techniques were discussed.   Risks such as bleeding, infection, abscess, leak, reoperation, injury to other organs, need for repair of tissues / organs, possible ostomy, hernia, heart attack, stroke, death, and other risks were discussed.  I noted a good likelihood this will help address the problem.   Goals of post-operative recovery were discussed as well.   Need for adequate nutrition, daily bowel regimen and healthy physical activity, to optimize recovery was noted as well. We will work to minimize complications.  Educational materials were available as well.  Questions were answered.  The patient expresses understanding & wishes to proceed with surgery.   PICC line placement             TNA reordered   S/P right colectomy - May 2017 - Dr. Johney Maine for T2, N0 adenoca.  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 Julien Nordmann  Plavix - last dose 8/22.  Will be 5 days tomorrow For CVA  Appreciate GI involvement, recommendations, and management.  30 minutes spent in review, evaluation, examination, counseling, and coordination of care.  More than 50% of that time was spent in counseling.  06/15/2018    Subjective: (Chief complaint)  Getting tired of NG tube but being compliant.  Husband  and daughter at bedside.  Some crampy abdominal pain.  Less diarrhea but still with some  flatus.  Objective:  Vital signs:  Vitals:   06/14/18 0650 06/14/18 1352 06/14/18 2200 06/15/18 0503  BP: (!) 128/43 (!) 155/54 (!) 147/67 (!) 146/53  Pulse: 74 75 78 70  Resp: _0 Temp: 99.3 F (37.4 C) 98.7 F (37.1 C) 98.6 F (37 C) 99.7 F (37.6 C)  TempSrc: Oral Oral Oral Oral  SpO2: 95% 99% 98% 96%  Weight:      Height:        Last BM Date: 06/14/18  Intake/Output   Yesterday:  08/25 0701 - 08/26 0700 In: 2339.8 [P.O.:120; I.V.:941.3; IV Piggyback:1278.5] Out: 1200 [Emesis/NG output:1200] This shift:  No intake/output data recorded.  Bowel function:  Flatus: YES  BM:  YES  Drain: Feculent   Physical Exam:  General: Pt awake/alert/oriented x4 in no acute distress Eyes: PERRL, normal EOM.  Sclera clear.  No icterus Neuro: CN II-XII intact w/o focal sensory/motor deficits. Lymph: No head/neck/groin lymphadenopathy Psych:  No delerium/psychosis/paranoia HENT: Normocephalic, Mucus membranes moist.  No thrush Neck: Supple, No tracheal deviation Chest: No chest wall pain w good excursion CV:  Pulses intact.  Regular rhythm MS: Normal AROM mjr joints.  No obvious deformity  Abdomen: Soft.  Mildy distended.  Nontender.  Lot of borborygmi/gurgling.  No evidence of peritonitis.  No incarcerated hernias.  Ext:   No deformity.  No mjr edema.  No cyanosis Skin: No petechiae / purpura  Results:   Labs: Results for orders placed or performed during the hospital encounter of 06/11/18 (from the past 48 hour(s))  Urine Culture     Status: None   Collection Time: 06/13/18  3:30 PM  Result Value Ref Range   Specimen Description      URINE, RANDOM Performed at [facility] [address] [place], [place] [address]    Special Requests      NONE Performed at [facility], [place] [address] [place], [place] [address]    Culture      NO GROWTH Performed at [facility], [place] [address] [place], [place]  [address]    Report Status 06/14/2018 FINAL   CBC with Differential/Platelet     Status: Abnormal   Collection Time: 06/14/18  3:35 AM  Result Value Ref Range   WBC 6.9 4.0 - 10.5 K/uL   RBC 2.83 (L) 3.87 - 5.11 MIL/uL   Hemoglobin 8.5 (L) 12.0 - 15.0 g/dL   HCT 26.2 (L) 36.0 - 46.0 %   MCV 92.6 78.0 - 100.0 fL   MCH 30.0 26.0 - 34.0 pg   MCHC 32.4 30.0 - 36.0 g/dL   RDW 13.5 11.5 - 15.5 %   Platelets 381 150 - 400 K/uL   Neutrophils Relative % 70 %   Neutro Abs 4.8 1.7 - 7.7 K/uL   Lymphocytes Relative 14 %   Lymphs Abs 0.9 0.7 - 4.0 K/uL   Monocytes Relative 15 %   Monocytes Absolute 1.1 (H) 0.1 - 1.0 K/uL   Eosinophils Relative 1 %   Eosinophils Absolute 0.1 0.0 - 0.7 K/uL   Basophils Relative 0 %   Basophils Absolute 0.0 0.0 - 0.1 K/uL    Comment: Performed at [facility], [place] [address], [place], [place] [address]  Basic metabolic panel     Status: Abnormal   Collection Time: 06/14/18  3:35 AM  Result Value  Ref Range   Sodium 142 135 - 145 mmol/L   Potassium 3.6 3.5 - 5.1 mmol/L    Comment: NO VISIBLE HEMOLYSIS DELTA CHECK NOTED    Chloride 114 (H) 98 - 111 mmol/L   CO2 22 22 - 32 mmol/L   Glucose, Bld 80 70 - 99 mg/dL   BUN 18 8 - 23 mg/dL   Creatinine, Ser 0.99 0.44 - 1.00 mg/dL   Calcium 8.6 (L) 8.9 - 10.3 mg/dL   GFR calc non Af Amer 53 (L) >60 mL/min   GFR calc Af Amer >60 >60 mL/min    Comment: (NOTE) The eGFR has been calculated using the CKD EPI equation. This calculation has not been validated in all clinical situations. eGFR's persistently <60 mL/min signify possible Chronic Kidney Disease.    Anion gap 6 5 - 15    Comment: Performed at [facility], [place] [address], [place], [place] [address]  CBC with Differential/Platelet     Status: Abnormal   Collection Time: 06/15/18  3:28 AM  Result Value Ref Range   WBC 7.1 4.0 - 10.5 K/uL   RBC 2.92 (L) 3.87 - 5.11 MIL/uL   Hemoglobin 8.6 (L) 12.0 - 15.0 g/dL   HCT 26.5  (L) 36.0 - 46.0 %   MCV 90.8 78.0 - 100.0 fL   MCH 29.5 26.0 - 34.0 pg   MCHC 32.5 30.0 - 36.0 g/dL   RDW 13.5 11.5 - 15.5 %   Platelets 396 150 - 400 K/uL   Neutrophils Relative % 68 %   Neutro Abs 4.9 1.7 - 7.7 K/uL   Lymphocytes Relative 14 %   Lymphs Abs 1.0 0.7 - 4.0 K/uL   Monocytes Relative 14 %   Monocytes Absolute 1.0 0.1 - 1.0 K/uL   Eosinophils Relative 4 %   Eosinophils Absolute 0.3 0.0 - 0.7 K/uL   Basophils Relative 0 %   Basophils Absolute 0.0 0.0 - 0.1 K/uL    Comment: Performed at [facility], [place] [address] [place], [place] [address]  Basic metabolic panel     Status: Abnormal   Collection Time: 06/15/18  3:28 AM  Result Value Ref Range   Sodium 144 135 - 145 mmol/L   Potassium 3.2 (L) 3.5 - 5.1 mmol/L   Chloride 113 (H) 98 - 111 mmol/L   CO2 24 22 - 32 mmol/L   Glucose, Bld 106 (H) 70 - 99 mg/dL   BUN 15 8 - 23 mg/dL   Creatinine, Ser 0.91 0.44 - 1.00 mg/dL   Calcium 8.8 (L) 8.9 - 10.3 mg/dL   GFR calc non Af Amer 58 (L) >60 mL/min   GFR calc Af Amer >60 >60 mL/min    Comment: (NOTE) The eGFR has been calculated using the CKD EPI equation. This calculation has not been validated in all clinical situations. eGFR's persistently <60 mL/min signify possible Chronic Kidney Disease.    Anion gap 7 5 - 15    Comment: Performed at [facility], [place] [address] [place], [place] [address]  Iron and TIBC     Status: Abnormal   Collection Time: 06/15/18  3:28 AM  Result Value Ref Range   Iron 35 28 - 170 ug/dL   TIBC 156 (L) 250 - 450 ug/dL   Saturation Ratios 22 10.4 - 31.8 %   UIBC 121 ug/dL    Comment: Performed at [facility], [place] [address], [place], [place] [address]  Ferritin  Status: None   Collection Time: 06/15/18  3:28 AM  Result Value Ref Range   Ferritin 84 11 - 307 ng/mL    Comment: Performed at [facility], [place] [address], [place], [place] [address]    Imaging  / Studies: Dg Abd 1 View  Result Date: 06/14/2018 CLINICAL DATA:  Obstruction of bowel. EXAM: ABDOMEN - 1 VIEW COMPARISON:  Radiograph yesterday at 0900 hour, CT 06/11/2018 FINDINGS: Tip of the enteric tube in the stomach. Rectal catheter in place. No evidence of free air. Persistent marked gaseous distension of bowel in the central lower abdomen currently 12.2 cm, previously 13.4 cm. Air within nondistended distal colon and rectum. Single prominent small bowel loop in the right abdomen. No other small bowel dilatation. Suspect small pleural effusions and basilar atelectasis. Calcified uterine fibroid. IMPRESSION: No significant change in the significantly distended bowel in the central lower abdomen, likely transverse colon. Air within nondistended colon distally with rectal catheter in place. Single prominent air-filled small bowel loop in the right lower abdomen without other small bowel dilatation. Electronically Signed   By: Jeb Levering M.D.   On: 06/14/2018 04:12   [place] Ekg Site Rite  Result Date: 06/13/2018 If Site Rite image not attached, placement could not be confirmed due to current cardiac rhythm.   Medications / Allergies: per chart  Antibiotics: Anti-infectives (From admission, onward)   Start     Dose/Rate Route Frequency Ordered Stop   06/12/18 1700  metroNIDAZOLE (FLAGYL) IVPB 500 mg     500 mg 100 mL/hr over 60 Minutes Intravenous Every 8 hours 06/12/18 1604     06/11/18 2245  cefTRIAXone (ROCEPHIN) 1 g in sodium chloride 0.9 % 100 mL IVPB     1 g 200 mL/hr over 30 Minutes Intravenous Daily at bedtime 06/11/18 2204          Note: Portions of this report may have been transcribed using voice recognition software. Every effort was made to ensure accuracy; however, inadvertent computerized transcription errors may be present.   Any transcriptional errors that result from this process are unintentional.     [name], MD, FACS, MASCRS Gastrointestinal and  Minimally Invasive Surgery    [address] [place], [place] [address] [phone] Main / Paging [phone] Fax

## 2018-06-15 NOTE — Care Management Important Message (Signed)
Important Message  Patient Details  Name: Kristin [name] MRN: [id number] Date of Birth: September 20, 1941   Medicare Important Message Given:  Yes    Kerin Salen 06/15/2018, 10:55 AMImportant Message  Patient Details  Name: Kristin [name] MRN: [id number] Date of Birth: 01/18/1941   Medicare Important Message Given:  Yes    Kerin Salen 06/15/2018, 10:55 AM

## 2018-06-15 NOTE — Anesthesia Postprocedure Evaluation (Signed)
Anesthesia Post Note  Patient: Kristin [name]  Procedure(s) Performed: FLEXIBLE SIGMOIDOSCOPY (N/A ) BOWEL DECOMPRESSION (N/A )     Patient location during evaluation: PACU Anesthesia Type: MAC Level of consciousness: awake and alert Pain management: pain level controlled Vital Signs Assessment: post-procedure vital signs reviewed and stable Respiratory status: spontaneous breathing, nonlabored ventilation, respiratory function stable and patient connected to nasal cannula oxygen Cardiovascular status: stable and blood pressure returned to baseline Postop Assessment: no apparent nausea or vomiting Anesthetic complications: no    Last Vitals:  Vitals:   06/14/18 2200 06/15/18 0503  BP: (!) 147/67 (!) 146/53  Pulse: 78 70  Resp: 18 18  Temp: 37 C 37.6 C  SpO2: 98% 96%    Last Pain:  Vitals:   06/15/18 0900  TempSrc:   PainSc: 0-No pain                 Effie Berkshire

## 2018-06-15 NOTE — Consult Note (Signed)
[facility] Nurse requested for preoperative stoma site marking  Discussed surgical procedure and stoma creation with patient and family. Daughter is at bedside.  Explained role of the [place] nurse team.  Provided the patient with educational booklet and provided samples of pouching options.  Answered patient and family questions.   Examined patient lying, sitting, and standing in order to place the marking in the patient's visual field, away from any creases or abdominal contour issues and within the rectus muscle.  Patient has had a major abdominal surgery in the past.   Marked for colostomy in the LLQ  5 cm to the left of the umbilicus and  4 cm /below the umbilicus.  Marked for ileostomy in the RLQ  5 cm to the right of the umbilicus and  4 _ cm below the umbilicus.   Patient's abdomen cleansed with CHG wipes at site markings, allowed to air dry prior to marking.Covered mark with thin film transparent dressing to preserve mark until date of surgery.   [place] Nurse team will follow up with patient after surgery for continue ostomy care and teaching.  Domenic Moras RN BSN [place] Pager [phone]

## 2018-06-16 ENCOUNTER — Inpatient Hospital Stay (HOSPITAL_COMMUNITY): Payer: Medicare Other | Admitting: Certified Registered Nurse Anesthetist

## 2018-06-16 ENCOUNTER — Encounter (HOSPITAL_COMMUNITY): Payer: Medicare Other

## 2018-06-16 ENCOUNTER — Encounter (HOSPITAL_COMMUNITY): Admission: EM | Disposition: A | Discharge status: Home Health | Payer: Self-pay | Source: Home / Self Care

## 2018-06-16 ENCOUNTER — Encounter (HOSPITAL_COMMUNITY): Payer: Self-pay

## 2018-06-16 HISTORY — PX: LAPAROSCOPIC SIGMOID COLECTOMY: SHX5928

## 2018-06-16 HISTORY — PX: COLOSTOMY: SHX63

## 2018-06-16 HISTORY — PX: COLECTOMY WITH COLOSTOMY CREATION/HARTMANN PROCEDURE: SHX6598

## 2018-06-16 LAB — CBC WITH DIFFERENTIAL/PLATELET
Basophils Absolute: 0 10*3/uL (ref 0.0–0.1)
Basophils Relative: 0 %
EOS PCT: 6 %
Eosinophils Absolute: 0.4 10*3/uL (ref 0.0–0.7)
HEMATOCRIT: 27.7 % — AB (ref 36.0–46.0)
Hemoglobin: 9.1 g/dL — ABNORMAL LOW (ref 12.0–15.0)
LYMPHS ABS: 0.9 10*3/uL (ref 0.7–4.0)
Lymphocytes Relative: 12 %
MCH: 29.5 pg (ref 26.0–34.0)
MCHC: 32.9 g/dL (ref 30.0–36.0)
MCV: 89.9 fL (ref 78.0–100.0)
MONO ABS: 1 10*3/uL (ref 0.1–1.0)
Monocytes Relative: 14 %
NEUTROS ABS: 4.8 10*3/uL (ref 1.7–7.7)
Neutrophils Relative %: 68 %
PLATELETS: 338 10*3/uL (ref 150–400)
RBC: 3.08 MIL/uL — AB (ref 3.87–5.11)
RDW: 13.6 % (ref 11.5–15.5)
WBC: 7.1 10*3/uL (ref 4.0–10.5)

## 2018-06-16 LAB — COMPREHENSIVE METABOLIC PANEL
ALT: 30 U/L (ref 0–44)
AST: 68 U/L — ABNORMAL HIGH (ref 15–41)
Albumin: 2.4 g/dL — ABNORMAL LOW (ref 3.5–5.0)
Alkaline Phosphatase: 68 U/L (ref 38–126)
Anion gap: 4 — ABNORMAL LOW (ref 5–15)
BILIRUBIN TOTAL: 0.4 mg/dL (ref 0.3–1.2)
BUN: 11 mg/dL (ref 8–23)
CO2: 27 mmol/L (ref 22–32)
CREATININE: 0.75 mg/dL (ref 0.44–1.00)
Calcium: 9 mg/dL (ref 8.9–10.3)
Chloride: 113 mmol/L — ABNORMAL HIGH (ref 98–111)
Glucose, Bld: 130 mg/dL — ABNORMAL HIGH (ref 70–99)
Potassium: 3.3 mmol/L — ABNORMAL LOW (ref 3.5–5.1)
Sodium: 144 mmol/L (ref 135–145)
TOTAL PROTEIN: 5.1 g/dL — AB (ref 6.5–8.1)

## 2018-06-16 LAB — PHOSPHORUS: Phosphorus: 2 mg/dL — ABNORMAL LOW (ref 2.5–4.6)

## 2018-06-16 LAB — GLUCOSE, CAPILLARY
GLUCOSE-CAPILLARY: 131 mg/dL — AB (ref 70–99)
Glucose-Capillary: 131 mg/dL — ABNORMAL HIGH (ref 70–99)
Glucose-Capillary: 182 mg/dL — ABNORMAL HIGH (ref 70–99)
Glucose-Capillary: 196 mg/dL — ABNORMAL HIGH (ref 70–99)

## 2018-06-16 LAB — TRIGLYCERIDES: Triglycerides: 99 mg/dL (ref <150)

## 2018-06-16 LAB — MAGNESIUM: MAGNESIUM: 2 mg/dL (ref 1.7–2.4)

## 2018-06-16 LAB — TYPE AND SCREEN
ABO/RH(D): B POS
Antibody Screen: NEGATIVE

## 2018-06-16 LAB — PREALBUMIN: PREALBUMIN: 6.5 mg/dL — AB (ref 18–38)

## 2018-06-16 SURGERY — COLECTOMY, SIGMOID, LAPAROSCOPIC
Anesthesia: General | Site: Abdomen

## 2018-06-16 MED ORDER — SODIUM CHLORIDE 0.9 % IV SOLN
2.0000 g | Freq: Two times a day (BID) | INTRAVENOUS | Status: AC
  Administered 2018-06-16: 2 g via INTRAVENOUS
  Filled 2018-06-16: qty 2

## 2018-06-16 MED ORDER — TRACE MINERALS CR-CU-MN-SE-ZN 10-1000-500-60 MCG/ML IV SOLN
INTRAVENOUS | Status: AC
  Administered 2018-06-16: 18:00:00 via INTRAVENOUS
  Filled 2018-06-16: qty 1440

## 2018-06-16 MED ORDER — 0.9 % SODIUM CHLORIDE (POUR BTL) OPTIME
TOPICAL | Status: DC | PRN
  Administered 2018-06-16: 2000 mL

## 2018-06-16 MED ORDER — FENTANYL CITRATE (PF) 100 MCG/2ML IJ SOLN
INTRAMUSCULAR | Status: DC | PRN
  Administered 2018-06-16 (×2): 50 ug via INTRAVENOUS
  Administered 2018-06-16: 100 ug via INTRAVENOUS

## 2018-06-16 MED ORDER — ONDANSETRON HCL 4 MG/2ML IJ SOLN
INTRAMUSCULAR | Status: DC | PRN
  Administered 2018-06-16: 4 mg via INTRAVENOUS

## 2018-06-16 MED ORDER — FAT EMULSION PLANT BASED 20 % IV EMUL
240.0000 mL | INTRAVENOUS | Status: AC
  Administered 2018-06-16: 240 mL via INTRAVENOUS
  Filled 2018-06-16: qty 250

## 2018-06-16 MED ORDER — DIPHENHYDRAMINE HCL 50 MG/ML IJ SOLN: 12.5000 mg | Freq: Four times a day (QID) | INTRAMUSCULAR | Status: DC | PRN

## 2018-06-16 MED ORDER — PHENYLEPHRINE 40 MCG/ML (10ML) SYRINGE FOR IV PUSH (FOR BLOOD PRESSURE SUPPORT)
PREFILLED_SYRINGE | INTRAVENOUS | Status: DC | PRN
  Administered 2018-06-16: 80 ug via INTRAVENOUS
  Administered 2018-06-16: 120 ug via INTRAVENOUS
  Administered 2018-06-16 (×2): 80 ug via INTRAVENOUS

## 2018-06-16 MED ORDER — LACTATED RINGERS IV SOLN
INTRAVENOUS | Status: DC
  Administered 2018-06-16: 09:00:00 via INTRAVENOUS

## 2018-06-16 MED ORDER — ONDANSETRON HCL 4 MG/2ML IJ SOLN
INTRAMUSCULAR | Status: AC
  Filled 2018-06-16: qty 2

## 2018-06-16 MED ORDER — ROCURONIUM BROMIDE 50 MG/5ML IV SOSY
PREFILLED_SYRINGE | INTRAVENOUS | Status: DC | PRN
  Administered 2018-06-16: 10 mg via INTRAVENOUS
  Administered 2018-06-16: 40 mg via INTRAVENOUS
  Administered 2018-06-16: 10 mg via INTRAVENOUS

## 2018-06-16 MED ORDER — PROMETHAZINE HCL 25 MG/ML IJ SOLN: 6.2500 mg | INTRAMUSCULAR | Status: DC | PRN

## 2018-06-16 MED ORDER — LIDOCAINE 2% (20 MG/ML) 5 ML SYRINGE
INTRAMUSCULAR | Status: DC | PRN
  Administered 2018-06-16: 60 mg via INTRAVENOUS

## 2018-06-16 MED ORDER — LIDOCAINE 2% (20 MG/ML) 5 ML SYRINGE
INTRAMUSCULAR | Status: AC
  Filled 2018-06-16: qty 5

## 2018-06-16 MED ORDER — LACTATED RINGERS IV SOLN
INTRAVENOUS | Status: DC | PRN
  Administered 2018-06-16: 10:00:00 via INTRAVENOUS

## 2018-06-16 MED ORDER — SODIUM CHLORIDE 0.9 % IV SOLN: 1000.0000 mL | Freq: Three times a day (TID) | INTRAVENOUS | Status: AC | PRN

## 2018-06-16 MED ORDER — BUPIVACAINE-EPINEPHRINE 0.25% -1:200000 IJ SOLN
INTRAMUSCULAR | Status: DC | PRN
  Administered 2018-06-16: 60 mL

## 2018-06-16 MED ORDER — DEXTROSE-NACL 5-0.9 % IV SOLN
INTRAVENOUS | Status: DC
  Administered 2018-06-16: 20:00:00 via INTRAVENOUS

## 2018-06-16 MED ORDER — KETAMINE HCL 10 MG/ML IJ SOLN
INTRAMUSCULAR | Status: DC | PRN
  Administered 2018-06-16: 30 mg via INTRAVENOUS

## 2018-06-16 MED ORDER — LIDOCAINE 2% (20 MG/ML) 5 ML SYRINGE
INTRAMUSCULAR | Status: DC | PRN
  Administered 2018-06-16: 1 mg/kg/h via INTRAVENOUS

## 2018-06-16 MED ORDER — SUGAMMADEX SODIUM 200 MG/2ML IV SOLN
INTRAVENOUS | Status: DC | PRN
  Administered 2018-06-16: 150 mg via INTRAVENOUS

## 2018-06-16 MED ORDER — PROPOFOL 10 MG/ML IV BOLUS
INTRAVENOUS | Status: AC
  Filled 2018-06-16: qty 20

## 2018-06-16 MED ORDER — FENTANYL CITRATE (PF) 100 MCG/2ML IJ SOLN
25.0000 ug | INTRAMUSCULAR | Status: DC | PRN
  Administered 2018-06-16 (×2): 50 ug via INTRAVENOUS

## 2018-06-16 MED ORDER — FENTANYL CITRATE (PF) 100 MCG/2ML IJ SOLN
INTRAMUSCULAR | Status: AC
  Filled 2018-06-16: qty 4

## 2018-06-16 MED ORDER — KETAMINE HCL 10 MG/ML IJ SOLN
INTRAMUSCULAR | Status: AC
  Filled 2018-06-16: qty 1

## 2018-06-16 MED ORDER — SODIUM CHLORIDE 0.9 % IV SOLN
INTRAVENOUS | Status: DC | PRN
  Administered 2018-06-16: 50 ug/min via INTRAVENOUS

## 2018-06-16 MED ORDER — DEXAMETHASONE SODIUM PHOSPHATE 10 MG/ML IJ SOLN
INTRAMUSCULAR | Status: AC
  Filled 2018-06-16: qty 1

## 2018-06-16 MED ORDER — POTASSIUM PHOSPHATES 15 MMOLE/5ML IV SOLN
20.0000 mmol | Freq: Once | INTRAVENOUS | Status: AC
  Administered 2018-06-16: 20 mmol via INTRAVENOUS
  Filled 2018-06-16: qty 6.67

## 2018-06-16 MED ORDER — SUCCINYLCHOLINE CHLORIDE 200 MG/10ML IV SOSY
PREFILLED_SYRINGE | INTRAVENOUS | Status: DC | PRN
  Administered 2018-06-16: 80 mg via INTRAVENOUS

## 2018-06-16 MED ORDER — PROCHLORPERAZINE EDISYLATE 10 MG/2ML IJ SOLN: 5.0000 mg | INTRAMUSCULAR | Status: DC | PRN

## 2018-06-16 MED ORDER — HYDROMORPHONE HCL 1 MG/ML IJ SOLN
0.5000 mg | INTRAMUSCULAR | Status: DC | PRN
  Administered 2018-06-17 – 2018-06-18 (×6): 1 mg via INTRAVENOUS
  Filled 2018-06-16 (×6): qty 1

## 2018-06-16 MED ORDER — ROCURONIUM BROMIDE 100 MG/10ML IV SOLN
INTRAVENOUS | Status: AC
  Filled 2018-06-16: qty 1

## 2018-06-16 MED ORDER — FENTANYL CITRATE (PF) 250 MCG/5ML IJ SOLN
INTRAMUSCULAR | Status: AC
  Filled 2018-06-16: qty 5

## 2018-06-16 MED ORDER — POTASSIUM CHLORIDE 10 MEQ/100ML IV SOLN: 10.0000 meq | INTRAVENOUS | Status: AC

## 2018-06-16 MED ORDER — BUPIVACAINE LIPOSOME 1.3 % IJ SUSP
INTRAMUSCULAR | Status: DC | PRN
  Administered 2018-06-16: 20 mL

## 2018-06-16 MED ORDER — BUPIVACAINE-EPINEPHRINE (PF) 0.25% -1:200000 IJ SOLN
INTRAMUSCULAR | Status: AC
  Filled 2018-06-16: qty 60

## 2018-06-16 MED ORDER — SODIUM CHLORIDE 0.9 % IV SOLN
INTRAVENOUS | Status: DC | PRN
  Administered 2018-06-16: 12:00:00 via INTRAPERITONEAL

## 2018-06-16 MED ORDER — PROPOFOL 10 MG/ML IV BOLUS
INTRAVENOUS | Status: DC | PRN
  Administered 2018-06-16: 120 mg via INTRAVENOUS

## 2018-06-16 MED ORDER — DEXAMETHASONE SODIUM PHOSPHATE 10 MG/ML IJ SOLN
INTRAMUSCULAR | Status: DC | PRN
  Administered 2018-06-16: 10 mg via INTRAVENOUS

## 2018-06-16 SURGICAL SUPPLY — 81 items
APPLIER CLIP 5 13 M/L LIGAMAX5 (MISCELLANEOUS)
APPLIER CLIP ROT 10 11.4 M/L (STAPLE)
CABLE HIGH FREQUENCY MONO STRZ (ELECTRODE) ×5 IMPLANT
CELLS DAT CNTRL 66122 CELL SVR (MISCELLANEOUS) IMPLANT
CHLORAPREP W/TINT 26ML (MISCELLANEOUS) ×3 IMPLANT
CLIP APPLIE 5 13 M/L LIGAMAX5 (MISCELLANEOUS) IMPLANT
CLIP APPLIE ROT 10 11.4 M/L (STAPLE) IMPLANT
COUNTER NEEDLE 20 DBL MAG RED (NEEDLE) ×3 IMPLANT
COVER MAYO STAND STRL (DRAPES) ×9 IMPLANT
COVER SURGICAL LIGHT HANDLE (MISCELLANEOUS) ×7 IMPLANT
DECANTER SPIKE VIAL GLASS SM (MISCELLANEOUS) ×5 IMPLANT
DRAIN CHANNEL 19F RND (DRAIN) ×2 IMPLANT
DRAPE LAPAROSCOPIC ABDOMINAL (DRAPES) ×3 IMPLANT
DRAPE SURG IRRIG POUCH 19X23 (DRAPES) ×5 IMPLANT
DRSG OPSITE POSTOP 4X10 (GAUZE/BANDAGES/DRESSINGS) IMPLANT
DRSG OPSITE POSTOP 4X6 (GAUZE/BANDAGES/DRESSINGS) IMPLANT
DRSG OPSITE POSTOP 4X8 (GAUZE/BANDAGES/DRESSINGS) IMPLANT
DRSG TEGADERM 2-3/8X2-3/4 SM (GAUZE/BANDAGES/DRESSINGS) ×8 IMPLANT
DRSG TEGADERM 4X4.75 (GAUZE/BANDAGES/DRESSINGS) ×2 IMPLANT
ELECT PENCIL ROCKER SW 15FT (MISCELLANEOUS) ×8 IMPLANT
ELECT REM PT RETURN 15FT ADLT (MISCELLANEOUS) ×5 IMPLANT
ENDOLOOP SUT PDS II  0 18 (SUTURE)
ENDOLOOP SUT PDS II 0 18 (SUTURE) IMPLANT
EVACUATOR SILICONE 100CC (DRAIN) ×2 IMPLANT
GAUZE SPONGE 2X2 8PLY STRL LF (GAUZE/BANDAGES/DRESSINGS) ×3 IMPLANT
GAUZE SPONGE 4X4 12PLY STRL (GAUZE/BANDAGES/DRESSINGS) ×3 IMPLANT
GLOVE ECLIPSE 8.0 STRL XLNG CF (GLOVE) ×10 IMPLANT
GLOVE INDICATOR 8.0 STRL GRN (GLOVE) ×10 IMPLANT
GOWN STRL REUS W/TWL XL LVL3 (GOWN DISPOSABLE) ×26 IMPLANT
HOLDER FOLEY CATH W/STRAP (MISCELLANEOUS) ×2 IMPLANT
IRRIG SUCT STRYKERFLOW 2 WTIP (MISCELLANEOUS) ×5
IRRIGATION SUCT STRKRFLW 2 WTP (MISCELLANEOUS) ×3 IMPLANT
LEGGING LITHOTOMY PAIR STRL (DRAPES) IMPLANT
LUBRICANT JELLY K Y 4OZ (MISCELLANEOUS) IMPLANT
MARKER PEN SURG W/LABELS BLK (STERILIZATION PRODUCTS) ×2 IMPLANT
PACK COLON (CUSTOM PROCEDURE TRAY) ×5 IMPLANT
PAD POSITIONING PINK XL (MISCELLANEOUS) ×5 IMPLANT
PORT LAP GEL ALEXIS MED 5-9CM (MISCELLANEOUS) ×2 IMPLANT
POSITIONER SURGICAL ARM (MISCELLANEOUS) ×2 IMPLANT
POUCH OSTOMY FLEX CONVEX 2-1/8 (OSTOMY) ×2 IMPLANT
RELOAD STAPLE 60 4.1 GRN THCK (STAPLE) IMPLANT
RELOAD STAPLER GREEN 60MM (STAPLE) ×6 IMPLANT
RETRACTOR WND ALEXIS 18 MED (MISCELLANEOUS) IMPLANT
RTRCTR WOUND ALEXIS 18CM MED (MISCELLANEOUS)
SCISSORS LAP 5X35 DISP (ENDOMECHANICALS) ×5 IMPLANT
SEALER TISSUE G2 STRG ARTC 35C (ENDOMECHANICALS) ×2 IMPLANT
SLEEVE ADV FIXATION 5X100MM (TROCAR) IMPLANT
SLEEVE XCEL OPT CAN 5 100 (ENDOMECHANICALS) ×4 IMPLANT
SPONGE GAUZE 2X2 STER 10/PKG (GAUZE/BANDAGES/DRESSINGS) ×2
SPONGE LAP 18X18 RF (DISPOSABLE) ×2 IMPLANT
STAPLER ECHELON LONG 60 440 (INSTRUMENTS) ×2 IMPLANT
STAPLER RELOAD GREEN 60MM (STAPLE) ×10
STAPLER VISISTAT 35W (STAPLE) IMPLANT
SUT MNCRL AB 4-0 PS2 18 (SUTURE) ×5 IMPLANT
SUT PDS AB 1 CTX 36 (SUTURE) ×6 IMPLANT
SUT PDS AB 1 TP1 96 (SUTURE) IMPLANT
SUT PDS AB 2-0 CT2 27 (SUTURE) ×2 IMPLANT
SUT PROLENE 0 CT 2 (SUTURE) IMPLANT
SUT PROLENE 2 0 SH DA (SUTURE) ×6 IMPLANT
SUT SILK 2 0 (SUTURE) ×2
SUT SILK 2 0 SH CR/8 (SUTURE) ×5 IMPLANT
SUT SILK 2-0 18XBRD TIE 12 (SUTURE) ×3 IMPLANT
SUT SILK 3 0 (SUTURE) ×2
SUT SILK 3 0 SH CR/8 (SUTURE) ×5 IMPLANT
SUT SILK 3-0 18XBRD TIE 12 (SUTURE) ×3 IMPLANT
SUT VIC AB 3-0 SH 18 (SUTURE) ×2 IMPLANT
SUT VICRYL 0 UR6 27IN ABS (SUTURE) ×7 IMPLANT
SYS LAPSCP GELPORT 120MM (MISCELLANEOUS)
SYSTEM LAPSCP GELPORT 120MM (MISCELLANEOUS) IMPLANT
TAPE UMBILICAL COTTON 1/8X30 (MISCELLANEOUS) ×5 IMPLANT
TOWEL OR 17X26 10 PK STRL BLUE (TOWEL DISPOSABLE) IMPLANT
TOWEL OR NON WOVEN STRL DISP B (DISPOSABLE) ×5 IMPLANT
TRAY FOLEY MTR SLVR 14FR STAT (SET/KITS/TRAYS/PACK) ×2 IMPLANT
TRAY FOLEY MTR SLVR 16FR STAT (SET/KITS/TRAYS/PACK) IMPLANT
TROCAR ADV FIXATION 5X100MM (TROCAR) IMPLANT
TROCAR BLADELESS OPT 5 100 (ENDOMECHANICALS) ×5 IMPLANT
TROCAR XCEL 12X100 BLDLESS (ENDOMECHANICALS) ×2 IMPLANT
TROCAR XCEL NON-BLD 11X100MML (ENDOMECHANICALS) IMPLANT
TUBING CONNECTING 10 (TUBING) ×2 IMPLANT
TUBING CONNECTING 10' (TUBING) ×2
TUBING INSUF HEATED (TUBING) ×5 IMPLANT

## 2018-06-16 NOTE — Anesthesia Procedure Notes (Addendum)
Procedure Name: Intubation Date/Time: 06/16/2018 9:56 AM Performed by: West Pugh, CRNA Pre-anesthesia Checklist: Patient identified, Emergency Drugs available, Suction available, Patient being monitored and Timeout performed Patient Re-evaluated:Patient Re-evaluated prior to induction Oxygen Delivery Method: Circle system utilized Preoxygenation: Pre-oxygenation with 100% oxygen Induction Type: IV induction, Rapid sequence and Cricoid Pressure applied Laryngoscope Size: Mac and 3 Grade View: Grade II Tube type: Subglottic suction tube Tube size: 7.0 mm Number of attempts: 1 Airway Equipment and Method: Stylet Placement Confirmation: ETT inserted through vocal cords under direct vision,  positive ETCO2,  CO2 detector and breath sounds checked- equal and bilateral Secured at: 21 cm Tube secured with: Tape Dental Injury: Teeth and Oropharynx as per pre-operative assessment

## 2018-06-16 NOTE — Discharge Instructions (Signed)
SURGERY: POST OP INSTRUCTIONS (Surgery for small bowel obstruction, colon resection, etc)   ######################################################################  EAT Gradually transition to a high fiber diet with a fiber supplement over the next few days after discharge  WALK Walk an hour a day.  Control your pain to do that.    CONTROL PAIN Control pain so that you can walk, sleep, tolerate sneezing/coughing, go up/down stairs.  HAVE A BOWEL MOVEMENT DAILY Keep your bowels regular to avoid problems.  OK to try a laxative to override constipation.  OK to use an antidairrheal to slow down diarrhea.  Call if not better after 2 tries  CALL IF YOU HAVE PROBLEMS/CONCERNS Call if you are still struggling despite following these instructions. Call if you have concerns not answered by these instructions  ######################################################################   DIET Follow a light diet the first few days at home.  Start with a bland diet such as soups, liquids, starchy foods, low fat foods, etc.  If you feel full, bloated, or constipated, stay on a ful liquid or pureed/blenderized diet for a few days until you feel better and no longer constipated. Be sure to drink plenty of fluids every day to avoid getting dehydrated (feeling dizzy, not urinating, etc.). Gradually add a fiber supplement to your diet over the next week.  Gradually get back to a regular solid diet.  Avoid fast food or heavy meals the first week as you are more likely to get nauseated. It is expected for your digestive tract to need a few months to get back to normal.  It is common for your bowel movements and stools to be irregular.  You will have occasional bloating and cramping that should eventually fade away.  Until you are eating solid food normally, off all pain medications, and back to regular activities; your bowels will not be normal. Focus on eating a low-fat, high fiber diet the rest of your life  (See Getting to [place], below).  CARE of your INCISION or WOUND It is good for closed incision and even open wounds to be washed every day.  Shower every day.  Short baths are fine.  Wash the incisions and wounds clean with soap & water.    If you have a closed incision(s), wash the incision with soap & water every day.  You may leave closed incisions open to air if it is dry.   You may cover the incision with clean gauze & replace it after your daily shower for comfort. If you have skin tapes (Steristrips) or skin glue (Dermabond) on your incision, leave them in place.  They will fall off on their own like a scab.  You may trim any edges that curl up with clean scissors.  If you have staples, set up an appointment for them to be removed in the office in 10 days after surgery.  If you have a drain, wash around the skin exit site with soap & water and place a new dressing of gauze or band aid around the skin every day.  Keep the drain site clean & dry.    If you have an open wound with packing, see wound care instructions.  In general, it is encouraged that you remove your dressing and packing, shower with soap & water, and replace your dressing once a day.  Pack the wound with clean gauze moistened with normal (0.9%) saline to keep the wound moist & uninfected.  Pressure on the dressing for 30 minutes will stop most wound  bleeding.  Eventually your body will heal & pull the open wound closed over the next few months.  Raw open wounds will occasionally bleed or secrete yellow drainage until it heals closed.  Drain sites will drain a little until the drain is removed.  Even closed incisions can have mild bleeding or drainage the first few days until the skin edges scab over & seal.   If you have an open wound with a wound vac, see wound vac care instructions.     ACTIVITIES as tolerated Start light daily activities --- self-care, walking, climbing stairs-- beginning the day after surgery.   Gradually increase activities as tolerated.  Control your pain to be active.  Stop when you are tired.  Ideally, walk several times a day, eventually an hour a day.   Most people are back to most day-to-day activities in a few weeks.  It takes 4-8 weeks to get back to unrestricted, intense activity. If you can walk 30 minutes without difficulty, it is safe to try more intense activity such as jogging, treadmill, bicycling, low-impact aerobics, swimming, etc. Save the most intensive and strenuous activity for last (Usually 4-8 weeks after surgery) such as sit-ups, heavy lifting, contact sports, etc.  Refrain from any intense heavy lifting or straining until you are off narcotics for pain control.  You will have off days, but things should improve week-by-week. DO NOT PUSH THROUGH PAIN.  Let pain be your guide: If it hurts to do something, don't do it.  Pain is your body warning you to avoid that activity for another week until the pain goes down. You may drive when you are no longer taking narcotic prescription pain medication, you can comfortably wear a seatbelt, and you can safely make sudden turns/stops to protect yourself without hesitating due to pain. You may have sexual intercourse when it is comfortable. If it hurts to do something, stop.  MEDICATIONS Take your usually prescribed home medications unless otherwise directed.   Blood thinners:  Usually you can restart any strong blood thinners after the second postoperative day.  It is OK to take aspirin right away.     If you are on strong blood thinners (warfarin/Coumadin, Plavix, Xerelto, Eliquis, Pradaxa, etc), discuss with your surgeon, medicine PCP, and/or cardiologist for instructions on when to restart the blood thinner & if blood monitoring is needed (PT/INR blood check, etc).     PAIN CONTROL Pain after surgery or related to activity is often due to strain/injury to muscle, tendon, nerves and/or incisions.  This pain is usually  short-term and will improve in a few months.  To help speed the process of healing and to get back to regular activity more quickly, DO THE FOLLOWING THINGS TOGETHER: 1. Increase activity gradually.  DO NOT PUSH THROUGH PAIN 2. Use Ice and/or Heat 3. Try Gentle Massage and/or Stretching 4. Take over the counter pain medication 5. Take Narcotic prescription pain medication for more severe pain  Good pain control = faster recovery.  It is better to take more medicine to be more active than to stay in bed all day to avoid medications. 1.  Increase activity gradually Avoid heavy lifting at first, then increase to lifting as tolerated over the next 6 weeks. Do not push through the pain.  Listen to your body and avoid positions and maneuvers than reproduce the pain.  Wait a few days before trying something more intense Walking an hour a day is encouraged to help your body recover faster  and more safely.  Start slowly and stop when getting sore.  If you can walk 30 minutes without stopping or pain, you can try more intense activity (running, jogging, aerobics, cycling, swimming, treadmill, sex, sports, weightlifting, etc.) Remember: If it hurts to do it, then dont do it! 2. Use Ice and/or Heat You will have swelling and bruising around the incisions.  This will take several weeks to resolve. Ice packs or heating pads (6-8 times a day, 30-60 minutes at a time) will help sooth soreness & bruising. Some people prefer to use ice alone, heat alone, or alternate between ice & heat.  Experiment and see what works best for you.  Consider trying ice for the first few days to help decrease swelling and bruising; then, switch to heat to help relax sore spots and speed recovery. Shower every day.  Short baths are fine.  It feels good!  Keep the incisions and wounds clean with soap & water.   3. Try Gentle Massage and/or Stretching Massage at the area of pain many times a day Stop if you feel pain - do not  overdo it 4. Take over the counter pain medication This helps the muscle and nerve tissues become less irritable and calm down faster Choose ONE of the following over-the-counter anti-inflammatory medications: Acetaminophen 533m tabs (Tylenol) 1-2 pills with every meal and just before bedtime (avoid if you have liver problems or if you have acetaminophen in you narcotic prescription) Naproxen 2252mtabs (ex. Aleve, Naprosyn) 1-2 pills twice a day (avoid if you have kidney, stomach, IBD, or bleeding problems) Ibuprofen 20064mabs (ex. Advil, Motrin) 3-4 pills with every meal and just before bedtime (avoid if you have kidney, stomach, IBD, or bleeding problems) Take with food/snack several times a day as directed for at least 2 weeks to help keep pain / soreness down & more manageable. 5. Take Narcotic prescription pain medication for more severe pain A prescription for strong pain control is often given to you upon discharge (for example: oxycodone/Percocet, hydrocodone/Norco/Vicodin, or tramadol/Ultram) Take your pain medication as prescribed. Be mindful that most narcotic prescriptions contain Tylenol (acetaminophen) as well - avoid taking too much Tylenol. If you are having problems/concerns with the prescription medicine (does not control pain, nausea, vomiting, rash, itching, etc.), please call us [phone] see if we need to switch you to a different pain medicine that will work better for you and/or control your side effects better. If you need a refill on your pain medication, you must call the office before 4 pm and on weekdays only.  By federal law, prescriptions for narcotics cannot be called into a pharmacy.  They must be filled out on paper & picked up from our office by the patient or authorized caretaker.  Prescriptions cannot be filled after 4 pm nor on weekends.    WHEN TO CALL US [phone] uncontrolled or worsening pain  Fever over 101 F (38.5 C) Concerns with  the incision: Worsening pain, redness, rash/hives, swelling, bleeding, or drainage Reactions / problems with new medications (itching, rash, hives, nausea, etc.) Nausea and/or vomiting Difficulty urinating Difficulty breathing Worsening fatigue, dizziness, lightheadedness, blurred vision Other concerns If you are not getting better after two weeks or are noticing you are getting worse, contact our office [phone] for further advice.  We may need to adjust your medications, re-evaluate you in the office, send you to the emergency room, or see what other things we can do to help. The  clinic staff is available to answer your questions during regular business hours (8:30am-5pm).  Please dont hesitate to call and ask to speak to one of our nurses for clinical concerns.    A surgeon from [facility] is always on call at the hospitals 24 hours/day If you have a medical emergency, go to the nearest emergency room or call 911.  FOLLOW UP in our office One the day of your discharge from the hospital (or the next business weekday), please call [facility] to set up or confirm an appointment to see your surgeon in the office for a follow-up appointment.  Usually it is 2-3 weeks after your surgery.   If you have skin staples at your incision(s), let the office know so we can set up a time in the office for the nurse to remove them (usually around 10 days after surgery). Make sure that you call for appointments the day of discharge (or the next business weekday) from the hospital to ensure a convenient appointment time. IF YOU HAVE DISABILITY OR FAMILY LEAVE FORMS, BRING THEM TO THE OFFICE FOR PROCESSING.  DO NOT GIVE THEM TO YOUR DOCTOR.  [facility] [address] [place], [place], [place]  [address] ? [phone] - Main [phone] - [place],  [phone] - Fax [email or web address]  GETTING TO GOOD BOWEL HEALTH. It is  expected for your digestive tract to need a few months to get back to normal.  It is common for your bowel movements and stools to be irregular.  You will have occasional bloating and cramping that should eventually fade away.  Until you are eating solid food normally, off all pain medications, and back to regular activities; your bowels will not be normal.   Avoiding constipation The goal: ONE SOFT BOWEL MOVEMENT A DAY!    Drink plenty of fluids.  Choose water first. TAKE A FIBER SUPPLEMENT EVERY DAY THE REST OF YOUR LIFE During your first week back home, gradually add back a fiber supplement every day Experiment which form you can tolerate.   There are many forms such as powders, tablets, wafers, gummies, etc Psyllium bran (Metamucil), methylcellulose (Citrucel), Miralax or Glycolax, Benefiber, Flax Seed.  Adjust the dose week-by-week (1/2 dose/day to 6 doses a day) until you are moving your bowels 1-2 times a day.  Cut back the dose or try a different fiber product if it is giving you problems such as diarrhea or bloating. Sometimes a laxative is needed to help jump-start bowels if constipated until the fiber supplement can help regulate your bowels.  If you are tolerating eating & you are farting, it is okay to try a gentle laxative such as double dose MiraLax, prune juice, or Milk of Magnesia.  Avoid using laxatives too often. Stool softeners can sometimes help counteract the constipating effects of narcotic pain medicines.  It can also cause diarrhea, so avoid using for too long. If you are still constipated despite taking fiber daily, eating solids, and a few doses of laxatives, call our office. Controlling diarrhea Try drinking liquids and eating bland foods for a few days to avoid stressing your intestines further. Avoid dairy products (especially milk & ice cream) for a short time.  The intestines often can lose the ability to digest lactose when stressed. Avoid foods that cause gassiness or  bloating.  Typical foods include beans and other legumes, cabbage, broccoli, and dairy foods.  Avoid greasy, spicy, fast foods.  Every person has  some sensitivity to other foods, so listen to your body and avoid those foods that trigger problems for you. Probiotics (such as active yogurt, Align, etc) may help repopulate the intestines and colon with normal bacteria and calm down a sensitive digestive tract Adding a fiber supplement gradually can help thicken stools by absorbing excess fluid and retrain the intestines to act more normally.  Slowly increase the dose over a few weeks.  Too much fiber too soon can backfire and cause cramping & bloating. It is okay to try and slow down diarrhea with a few doses of antidiarrheal medicines.   Bismuth subsalicylate (ex. Kayopectate, Pepto Bismol) for a few doses can help control diarrhea.  Avoid if pregnant.   Loperamide (Imodium) can slow down diarrhea.  Start with one tablet (69m) first.  Avoid if you are having fevers or severe pain.  ILEOSTOMY PATIENTS WILL HAVE CHRONIC DIARRHEA since their colon is not in use.    Drink plenty of liquids.  You will need to drink even more glasses of water/liquid a day to avoid getting dehydrated. Record output from your ileostomy.  Expect to empty the bag every 3-4 hours at first.  Most people with a permanent ileostomy empty their bag 4-6 times at the least.   Use antidiarrheal medicine (especially Imodium) several times a day to avoid getting dehydrated.  Start with a dose at bedtime & breakfast.  Adjust up or down as needed.  Increase antidiarrheal medications as directed to avoid emptying the bag more than 8 times a day (every 3 hours). Work with your wound ostomy nurse to learn care for your ostomy.  See ostomy care instructions. TROUBLESHOOTING IRREGULAR BOWELS 1) Start with a soft & bland diet. No spicy, greasy, or fried foods.  2) Avoid gluten/wheat or dairy products from diet to see if symptoms improve. 3) Miralax  17gm or flax seed mixed in [place] water or juice-daily. May use 2-4 times a day as needed. 4) Gas-X, Phazyme, etc. as needed for gas & bloating.  5) Prilosec (omeprazole) over-the-counter as needed 6)  Consider probiotics (Align, Activa, etc) to help calm the bowels down  Call your doctor if you are getting worse or not getting better.  Sometimes further testing (cultures, endoscopy, X-ray studies, CT scans, bloodwork, etc.) may be needed to help diagnose and treat the cause of the diarrhea. [facility] [place]  [phone] Main.    [phone] - Toll Free.   [phone] Fax [email or web address]  Ostomy Support Information  YTheresia Majorsheard that people get along just fine with only one of their eyes, or one of their lungs, or one of their kidneys. But you also know that you have only one intestine and only one bladder, and that leaves you feeling awfully empty, both physically and emotionally: You think no other people go around without part of their intestine with the ends of their intestines sticking out through their abdominal walls.   YOU ARE NOT ALONE.  There are nearly three quarters of a million people in the [place] have [place]; people who have had surgery to remove all or part of their colons or bladders.   There is even a national association, the [place] over 350 local affiliated support groups that are organized by volunteers who provide peer support and counseling. UJuan Quamhas a toll free telephone num-ber, [phone] an educational, interactive website, www.ostomy.org  An ostomy is an opening in the belly (abdominal wall) made by surgery. Ostomates are people who have had this procedure. The opening (stoma) allows the kidney or bowel to discharge waste. An external pouch covers the stoma to collect waste. Pouches are are a simple bag and are odor free.  Different companies have disposable or reusable pouches to fit one's lifestyle. An ostomy can either be temporary or permanent.   THERE ARE THREE MAIN TYPES OF OSTOMIES  Colostomy. A colostomy is a surgically created opening in the large intestine (colon).  Ileostomy. An ileostomy is a surgically created opening in the small intestine.  Urostomy. A urostomy is a surgically created opening to divert urine away from the bladder.  OSTOMY Care  The following guidelines will make care of your colostomy easier. Keep this information close by for quick reference.  Helpful DIET hints Eat a well-balanced diet including vegetables and fresh fruits. Eat on a regular schedule. Drink at least 6 to 8 glasses of fluids daily. Eat slowly in a relaxed atmosphere. Chew your food thoroughly. Avoid chewing gum, smoking, and drinking from a straw. This will help decrease the amount of air you swallow, which may help reduce gas. Eating yogurt or drinking buttermilk may help reduce gas.  To control gas at night, do not eat after 8 p.m. This will give your bowel time to quiet down before you go to bed.  If gas is a problem, you can purchase Beano. Sprinkle Beano on the first bite of food before eating to reduce gas. It has no flavor and should not change the taste of your food. You can buy Beano over the counter at your local drugstore.  Foods like fish, onions, garlic, broccoli, asparagus, and cabbage produce odor. Although your pouch is odor-proof, if you eat these foods you may notice a stronger odor when emptying your pouch. If this is a concern, you may want to limit these foods in your diet.  If you have an ileostomy, you will have chronic diarrhea & need to drink more liquids to avoid getting dehydrated.  Consider antidiarrheal medicine like imodium (loperamide) or Lomotil to help slow down bowel movements / diarrhea into your ileostomy bag.  GETTING TO GOOD BOWEL HEALTH WITH AN ILEOSTOMY . Irregular  bowel habits such as constipation and diarrhea can lead to many problems over time.  The goal: 3-6 small BOWEL MOVEMENTS A DAY!  To have soft, regular bowel movements:   Drink plenty of fluids, consider 4-6 tall glasses of water a day.    Controlling diarrheA  o Switch to liquids and simpler foods for a few days to avoid stressing your intestines further. o Avoid dairy products (especially milk & ice cream) for a short time.  The intestines often can lose the ability to digest lactose when stressed. o Avoid foods that cause gassiness or bloating.  Typical foods include beans and other legumes, cabbage, broccoli, and dairy foods.  Every person has some sensitivity to other foods, so listen to our body and avoid those foods that trigger problems for you. o Adding fiber (Citrucel, Metamucil, psyllium, Miralax) gradually can help thicken stools by absorbing excess fluid and retrain the intestines to act more normally.  Slowly increase the dose over a few weeks.  Too much fiber too soon can backfire and cause cramping & bloating. o Probiotics (such as active yogurt, Align, etc) may help repopulate the intestines and colon with normal bacteria and calm down a sensitive digestive tract.  Most  studies show it to be of mild help, though, and such products can be costly. o Medicines: - Bismuth subsalicylate (ex. Kayopectate, Pepto Bismol) every 30 minutes for up to 6 doses can help control diarrhea.  Avoid if pregnant. - Loperamide (Immodium) can slow down diarrhea.  Start with two tablets (23m total) first and then try one tablet every 6 hours.  Avoid if you are having fevers or severe pain.  If you are not better or start feeling worse, stop all medicines and call your doctor for advice o Call your doctor if you are getting worse or not better.  Sometimes further testing (cultures, endoscopy, X-ray studies, bloodwork, etc) may be needed to help diagnose and treat the cause of the diarrhea.  TROUBLESHOOTING  IRREGULAR BOWELS 1) Avoid extremes of bowel movements (no bad constipation/diarrhea) 2) Miralax 17gm mixed in 8oz. water or juice-daily. May use twice a day as needed  3) Gas-x,Phazyme, etc. as needed for gas & bloating.  4) Soft,bland diet. No spicy,greasy,fried foods.  5) Prilosec (omeprazole) over-the-counter as needed  6) May hold gluten/wheat products from diet to see if symptoms improve.  7)  May try probiotics (Align, Activa, etc) to help calm the bowels down 7) If symptoms become worse call back immediately.   Applying the pouching system To apply your pouch, follow these steps:  Place all your equipment close at hand before removing your pouch.  Wash your hands.  Stand or sit in front of a mirror. Use the position that works best for you. Remember that you must keep the skin around the stoma wrinkle-free for a good seal.  Gently remove the used pouch (1-piece system) or the pouch and old wafer (2-piece system). Empty the pouch into the toilet. Save the closure clip to use again.  Wash the stoma itself and the skin around the stoma. Your stoma may bleed a little when being washed. This is normal. Rinse and pat dry. You may use a wash cloth or soft paper towels (like Bounty), mild soap (like Dial, Safeguard, or [place], and water. Avoid soaps that contain perfumes or lotions.  For a new pouch (1-piece system) or a new wafer (2-piece system), measure your stoma using the stoma guide in each box of supplies.  Trace the shape of your stoma onto the back of the new pouch or the back of the new wafer. Cut out the opening. Remove the paper backing and set it aside.  Optional: Apply a skin barrier powder to surrounding skin if it is irritated (bare or weeping), and dust off the excess. Optional: Apply a skin-prep wipe (such as Skin Prep or All-Kare) to the skin around the stoma, and let it dry. Do not apply this solution if the skin is irritated (red, tender, or broken) or if you have  shaved around the stoma. Optional: Apply a skin barrier paste (such as Stomahesive, Coloplast, or Premium) around the opening cut in the back of the pouch or wafer. Allow it to dry for 30 to 60 seconds.  Hold the pouch (1-piece system) or wafer (2-piece system) with the sticky side toward your body. Make sure the skin around the stoma is wrinkle-free. Center the opening on the stoma, then press firmly to your abdomen (Fig. 4). Look in the mirror to check if you are placing the pouch, or wafer, in the right position. For a 2-piece system, snap the pouch onto the wafer. Make sure it snaps into place securely.  Place your hand over the  stoma and the pouch or wafer for about 30 seconds. The heat from your hand can help the pouch or wafer stick to your skin.  Add deodorant (such as Super Banish or Nullo) to your pouch. Other options include food extracts such as vanilla oil and peppermint extract. Add about 10 drops of the deodorant to the pouch. Then apply the closure clamp. Note: Do not use toxic  chemicals or commercial cleaning agents in your pouch. These substances may harm the stoma.  Optional: For extra seal, apply tape to all 4 sides around the pouch or wafer, as if you were framing a picture. You may use any brand of medical adhesive tape. Change your pouch every 5 to 7 days. Change it immediately if a leak occurs.  Wash your hands afterwards.  If you are wearing a 2-piece system, you may use 2 new pouches per week and alternate them. Rinse the pouch with mild soap and warm water and hang it to dry for the next day. Apply the fresh pouch. Alternate the 2 pouches like this for a week. After a week, change the wafer and begin with 2 new pouches. Place the old pouches in a plastic bag, and put them in the trash.    Tips for colostomy care  Applying Your Pouch You may stand or sit to apply your pouch.  Keep the skin where you apply the pouch wrinkle-free. If the skin around the pouch is  wrinkled, the seal may break when your skin stretches.  If hair grows close to your stoma, you may trim off the hair with scissors, an electric razor, or a safety razor.  Always have a mirror nearby so you can get a better view of your stoma.  When you apply a new pouch, write the date on the adhesive tape. This will remind you of when you last changed your pouch.  Changing Your Pouch The best time to change your pouch is in the morning, before eating or drinking anything. Your stoma can function at any time, but it will function more after eating or drinking.  Emptying Your Pouch Empty your pouch when it is one-third full (of urine, stool, and/or gas). If you wait until your pouch is fuller than this, it will be more difficult to empty and more noticeable. When you empty your pouch, either put toilet paper in the toilet bowl first, or flush the toilet while you empty the pouch. This will reduce splashing. You can empty the pouch between your legs or to one side while sitting, or while standing or stooping. If you have a 2-piece system, you can snap off the pouch to empty it. Remember that your stoma may function during this time. If you wish to rinse your pouch after you empty it, a [place] baster can be helpful. When using a baster, squirt water up into the pouch through the opening at the bottom. With a 2-piece system, you can snap off the pouch to rinse it. After rinsing  your pouch, empty it into the toilet. When rinsing your pouch at home, put a few granules of Dreft soap in the rinse water. This helps lubricate and freshen your pouch. The inside of your pouch can be sprayed with non-stick cooking oil (Pam spray). This may help reduce stool sticking to the inside of the pouch.  Bathing You may shower or bathe with your pouch on or off. Remember that your stoma may function during this time.  The materials you use to  wash your stoma and the skin around it should be clean, but they do not  need to be sterile.  Wearing Your Pouch During hot weather, or if you perspire a lot in general, wear a cover over your pouch. This may prevent a rash on your skin under the pouch. Pouch covers are sold at ostomy supply stores. Wear the pouch inside your underwear for better support. Watch your weight. Any gain or loss of 10 to 15 pounds or more can change the way your pouch fits.  Going Away From Home A collapsible cup (like those that come in travel kits) or a soft plastic squirt bottle with a pull-up top (like a travel bottle for shampoo) can be used for rinsing your pouch when you are away from home. Tilt the opening of the pouch at an upward angle when using a cup to rinse.  Carry wet wipes or extra tissues to use in public bathrooms.  Carry an extra pouching system with you at all times.  Never keep ostomy supplies in the glove compartment of your car. Extreme heat or cold can damage the skin barriers and adhesive wafers on the pouch.  When you travel, carry your ostomy supplies with you at all times. Keep them within easy reach. Do not pack ostomy supplies in baggage that will be checked or otherwise separated from you, because your baggage might be lost. If youre traveling out of the country, it is helpful to have a letter stating that you are carrying ostomy supplies as a medical necessity.  If you need ostomy supplies while traveling, look in the yellow pages of the telephone book under Surgical Supplies. Or call the local ostomy organization to find out where supplies are available.  Do not let your ostomy supplies get low. Always order new pouches before you use the last one.  Reducing Odor Limit foods such as broccoli, cabbage, onions, fish, and garlic in your diet to help reduce odor. Each time you empty your pouch, carefully clean the opening of the pouch, both inside and outside, with toilet paper. Rinse your pouch 1 or 2 times daily after you empty it (see directions for  emptying your pouch and going away from home). Add deodorant (such as Super Banish or Nullo) to your pouch. Use air deodorizers in your bathroom. Do not add aspirin to your pouch. Even though aspirin can help prevent odor, it could cause ulcers on your stoma.  When to call the doctor Call the doctor if you have any of the following symptoms: Purple, black, or white stoma Severe cramps lasting more than 6 hours Severe watery discharge from the stoma lasting more than 6 hours No output from the colostomy for 3 days Excessive bleeding from your stoma Swelling of your stoma to more than 1/2-inch larger than usual Pulling inward of your stoma below skin level Severe skin irritation or deep ulcers Bulging or other changes in your abdomen  When to call your ostomy nurse Call your ostomy/enterostomal therapy (ET) nurse if any of the following occurs: Frequent leaking of your pouching system Change in size or appearance of your stoma, causing discomfort or problems with your pouch Skin rash or rawness Weight gain or loss that causes problems with your pouch      FREQUENTLY ASKED QUESTIONS   Why havent you met any of these folks who have an ostomy?  Well, maybe you have! You just did not recognize them because an ostomy doesn't show. It can be kept  secret if you wish. Why, maybe some of your best friends, office associates or neighbors have an ostomy ... you never can tell.   People facing ostomy surgery have many quality-of-life questions like:  Will you bulge? Smell? Make noises? Will you feel waste leaving your body? Will you be a captive of the toilet? Will you starve? Be a social outcast? Get/stay married? Have babies? Easily bathe, go swimming, bend over?  OK, lets look at what you can expect:   Will you bulge?  Remember, without part of the intestine or bladder, and its contents, you should have a flatter tummy than before. You can expect to wear, with little exception, what you  wore before surgery ... and this in-cludes tight clothing and bathing suits.   Will you smell?  Today, thanks to modern odor proof pouching systems, you can walk into an ostomy support group meeting and not smell anything that is foul or offensive. And, for those with an ileostomy or colostomy who are concerned about odor when emptying their pouch, there are in-pouch deodorants that can be used to eliminate any waste odors that may exist.   Will you make noises?  Everyone produces gas, especially if they are an air-swallower. But intestinal sounds that occur from time to time are no differ-ent than a gurgling tummy, and quite often your clothing will muffle any sounds.   Will you feel the waste discharges?  For those with a colostomy or ileostomy there might be a slight pressure when waste leaves your body, but understand that the intestines have no nerve endings, so there will be no unpleasant sensations. Those with a urostomy will probably be unaware of any kidney drainage.   Will you be a captive of the toilet?  Immediately post-op you will spend more time in the bathroom than you will after your body recovers from surgery. Every person is different, but on average those with an ileostomy or urostomy may empty their pouches 4 to 6 times a day; a little  less if you have a colostomy. The average wear time between pouch system changes is 3 to 5 days and the changing process should take less than 30 minutes.   Will I need to be on a special diet? Most people return to their normal diet when they have recovered from surgery. Be sure to chew your food well, eat a well-balanced diet and drink plenty of fluids. If you experience problems with a certain food, wait a couple of weeks and try it again.  Will there be odor and noises? Pouching systems are designed to be odor-proof or odor-resistant. There are deodorants that can be used in the pouch. Medications are also available to help reduce odor.  Limit gas-producing foods and carbonated beverages. You will experience less gas and fewer noises as you heal from surgery.  How much time will it take to care for my ostomy? At first, you may spend a lot of time learning about your ostomy and how to take care of it. As you become more comfortable and skilled at changing the pouching system, it will take very little time to care for it.   Will I be able to return to work? People with ostomies can perform most jobs. As soon as you have healed from surgery, you should be able to return to work. Heavy lifting (more than 10 pounds) may be discouraged.   What about intimacy? Sexual relationships and intimacy are important and fulfilling aspects of your life.  They should continue after ostomy surgery. Intimacy-related concerns should be discussed openly between you and your partner.  ° °Can I wear regular clothing? °You do not need to wear special clothing. Ostomy pouches are fairly flat and barely noticeable. Elastic undergarments will not hurt the stoma or prevent the ostomy from functioning.  ° °Can I participate in sports? °An ostomy should not limit your involvement in sports. Many people with ostomies are runners, skiers, swimmers or participate in other active lifestyles. Talk with your caregiver first before doing heavy physical activity. ° °Will you starve?  °Not if you follow doctor’s orders at each stage of your post-op adjustment. There is no such thing as an “ostomy diet”. Some people with an ostomy will be able to eat and tolerate anything; others may find diffi-culty with some foods. Each person is an individual and must determine, by trial, what is best for them. A good practice for all is to drink plenty of water.  ° °Will you be a social outcast?  °Have you met anyone who has an ostomy and is a social outcast? Why should you be the first? Only your attitude and self image will effect how you are treated. No confi-dent person is an outcast.   ° ° ° °PROFESSIONAL HELP  °Resources are available if you need help or have questions about your ostomy.  ° °· Specially trained nurses called Wound, Ostomy Continence Nurses (WOCN) are available for consultation in most major medical centers. ° °· Consider getting an ostomy consult at one of the outpatient ostomy clinics.  [facility] in [place] at this time ° °· The [employer] Ostomy Association (UOA) is a group made up of many local chapters throughout the [place]. These local groups hold meetings and provide support to prospective and existing ostomates. They sponsor educational events and have qualified visitors to make personal or telephone visits. Contact the UOA for the chapter nearest you and for other educational publications. ° °· More detailed information can be found in Colostomy Guide, a publication of the [employer] Ostomy Association (UOA). Contact UOA at 1-800-826-0826 or visit their web site at www.uoaa.org. The website contains links to other sites, suppliers and resources. ° °· [facility] Secure Start Services: °· Start at the website to enlist for support.  Your Wound Ostomy (WOCN) nurse may have started this process. https://www.[facility].com/en/securestart °· Secure Start services are designed to support people as they live their lives with an ostomy or neurogenic bladder. Enrolling is easy and at no cost to the patient. We realize that each person's needs and life journey are different. Through Secure Start services, we want to help people live their life, their way. ° °

## 2018-06-16 NOTE — Progress Notes (Signed)
PHARMACY - ADULT TOTAL PARENTERAL NUTRITION CONSULT NOTE   Pharmacy Consult for TPN Indication: Bowel obstruction  Patient Measurements: Height: 5' 6.5" (168.9 cm) Weight: 144 lb 11.2 oz (65.6 kg) IBW/kg (Calculated) : 60.45 TPN AdjBW (KG): 63.5 Body mass index is 23.01 kg/m. 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  Abd CT on 8/22 showed "short segmental thickening in the rectosigmoid causing large and small-bowel obstruction."  She had sigmoidoscopy on 8/23 that showed stenosis in the sigmoid colon with decompression tube placed.  TPN started on 8/26 for bowel obstruction.  Significant events:  - 8/27: plan for colectomy  Today:   Glucose (goal <150): no hx DM; cbgs at goal  Electrolytes: K low 3.3, phos low 2; CorrCa 10, Mag 2  Renal: scr ok  LFTs: AST slightly elevated at 68, Tbili wnl  TGs: 99 (8/27)  Prealbumin: pemding  NUTRITIONAL GOALS                                                                                             RD recs (8/26): Kcal:  1800-2000 Protein:  80-90g  Clinimix E 5/15 at a goal rate of 75 ml/hr + 20% fat emulsion at 12ml/hr for 12hrs to provide: 90 g/day protein, 1758 Kcal/day.  PLAN                                                                                                                          Now:  - potassium chloride 10 meq x4 runs - potassium phosphate 20 mmol IV x1  At 1800 today:  Increase Clinimix E 5/15  at 60 ml/hr.  20% fat emulsion at 20  ml/hr x12 hrs.  Add pepcid 40 mg to TPN bag  Plan to advance as tolerated to the goal rate.  TPN to contain standard multivitamins and trace elements.  Reduce IVF to [facility]  Continue SSI q6h  TPN lab panels on Mondays & Thursdays.  F/u daily.  Kristin [name] P 06/16/2018,7:31 AM

## 2018-06-16 NOTE — Progress Notes (Signed)
PROGRESS NOTE  Kristin [name] JJK:[id number] DOB: August 12, 1941 DOA: 06/11/2018 PCP: Eber Hong, MD  HPI/Recap of past 24 hours: Kristin [name] is a 77 y.o. 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 Patient also reports some urinary symptoms and was recently prescribed Keflex. In the ED, CT of the abdomen and pelvis reveals a short segment of thickening in the rectosigmoid colon causing obstruction. UA suggestive of infection. Surgery consulted. Pt admitted for further management.   Today, saw pt right after surgery. NAD, sleepy. Daughter and husband at bedside.   TRH WILL SIGN OFF AS GENERAL SURGERY HAS TAKEN OVER PATIENT. 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 S/P laparoscopic hartmann resection, end colostomy, laparoscopic lysis of adhesions on 06/16/18  Currently afebrile with no leukocytosis CT abd/pelvis showed thickening involving a short segment of rectosigmoid colon causing obstruction  Repeat Abd xray with no sig change Surgery on board: They are currently primary GI on board: Had sigmoidoscopy on 06/12/18, with rectal tube placement for decompression Continue Flagyl Continue TPN started by Gen  Surg  Hypokalemia Replace prn  Possible UTI  Afebrile, no leukocytosis  UA with mod leukocytes, WBC 11-20  UC showed no growth  Anemia of chronic disease (Hx of malignancy/CKD) Hgb baseline around 10 Currently likely worsened by hemodilution and surgery Iron panel WNL except for low TIBC  History of colon/lung/breast cancer CT C/A/P last month with no concerning findings for disease progression per oncology notes Oncology outpatient follow up  Hypertension  Hydralazine IV prn for now while NPO  Hold home lisinopril  CKD stage III  Cr at baseline     TRH WILL SIGN OFF AS GENERAL SURGERY HAS TAKEN OVER PATIENT. PLEASE CONSULT IF WE ARE NEEDED   Code Status: Full  Family Communication: Daughter and husband at bedside  Disposition Plan: TBD by Gen surg   Consultants:  Gen surg  GI   Procedures:  Sigmoidoscopy on 06/12/18  Antimicrobials:  IV Flagyl   DVT prophylaxis: Heparin [place]   Objective: Vitals:   06/16/18 1300 06/16/18 1315 06/16/18 1337 06/16/18 1453  BP: 135/66 (!) 125/58 121/66 127/60  Pulse: 74 77 80 85  Resp: (!) 23 20 18 14   Temp:  97.9 F (36.6 C) 97.8 F (36.6 C) 98.1 F (36.7 C)  TempSrc:   Oral Oral  SpO2: 100% 97% 93% 100%  Weight:      Height:        Intake/Output Summary (Last 24 hours) at 06/16/2018 1455 Last data filed at 06/16/2018 1454 Gross per 24 hour  Intake 3735.58 ml  Output 1950 ml  Net 1785.58 ml   Filed Weights   06/12/18 1250 06/12/18 1727 06/16/18 0915  Weight: 63.5 kg 65.6 kg 65.6 kg    Exam:   General: NAD, sleepy  Cardiovascular: S1, S2 present  Respiratory: CTAB  Abdomen: Soft, generalized tenderness, hypoactive BS  Musculoskeletal: No pedal edema bilaterally  Skin: Normal  Psychiatry: Normal mood   Data Reviewed: CBC: Recent Labs  Lab 06/11/18 1856 06/12/18 0418 06/13/18 0519 06/14/18 0335 06/15/18 0328 06/16/18 0451  WBC 8.9 6.7 7.3 6.9 7.1 7.1  NEUTROABS 6.8  --  5.2 4.8  4.9 4.8  HGB 11.9* 10.0* 9.5* 8.5* 8.6* 9.1*  HCT 37.3 31.7* 29.5* 26.2* 26.5* 27.7*  MCV 91.9 92.2 92.8 92.6 90.8 89.9  PLT 522* 468* 430* 381 396 948   Basic Metabolic Panel: Recent Labs  Lab 06/12/18 0418 06/13/18 0519 06/14/18 0335 06/15/18 0328 06/16/18 0451  NA 142 141 142 144 144  K 5.1 4.4 3.6 3.2* 3.3*  CL 114* 114* 114* 113* 113*  CO2 23 23 22 24 27   GLUCOSE 114* 98 80 106* 130*  BUN 25* 20 18 15 11   CREATININE 1.32* 1.15* 0.99 0.91 0.75  CALCIUM 9.1 9.0 8.6* 8.8* 9.0  MG  --   --   --   --  2.0  PHOS  --   --   --   --  2.0*   GFR: Estimated Creatinine Clearance: 54.5 mL/min (by C-G formula based on SCr of 0.75 mg/dL). Liver Function Tests: Recent Labs  Lab 06/11/18 1856 06/16/18 0451  AST 14* 68*  ALT 11 30  ALKPHOS 77 68  BILITOT 0.3 0.4  PROT 7.0 5.1*  ALBUMIN 3.2* 2.4*   Recent Labs  Lab 06/11/18 1856  LIPASE 55*   No results for input(s): AMMONIA in the last 168 hours. Coagulation Profile: No results for input(s): INR, PROTIME in the last 168 hours. Cardiac Enzymes: No results for input(s): CKTOTAL, CKMB, CKMBINDEX, TROPONINI in the last 168 hours. BNP (last 3 results) No results for input(s): PROBNP in the last 8760 hours. HbA1C: Recent Labs    06/15/18 0325  HGBA1C 5.2   CBG: Recent Labs  Lab 06/15/18 1715 06/16/18 0013 06/16/18 0541  GLUCAP 120* 131* 131*   Lipid Profile: Recent Labs    06/16/18 0451  TRIG 99   Thyroid Function Tests: No results for input(s): TSH, T4TOTAL, FREET4, T3FREE, THYROIDAB in the last 72 hours. Anemia Panel: Recent Labs    06/15/18 0328  FERRITIN 84  TIBC 156*  IRON 35   Urine analysis:    Component Value Date/Time   COLORURINE YELLOW 06/11/2018 1801   APPEARANCEUR CLEAR 06/11/2018 1801   LABSPEC 1.024 06/11/2018 1801   PHURINE 5.0 06/11/2018 1801   GLUCOSEU NEGATIVE 06/11/2018 1801   HGBUR NEGATIVE 06/11/2018 1801   BILIRUBINUR NEGATIVE 06/11/2018 1801   KETONESUR NEGATIVE  06/11/2018 1801   PROTEINUR NEGATIVE 06/11/2018 1801   UROBILINOGEN 0.2 04/29/2013 2243   NITRITE NEGATIVE 06/11/2018 1801   LEUKOCYTESUR MODERATE (A) 06/11/2018 1801   Sepsis Labs: @LABRCNTIP (procalcitonin:4,lacticidven:4)  ) Recent Results (from the past 240 hour(s))  Urine Culture     Status: None   Collection Time: 06/13/18  3:30 PM  Result Value Ref Range Status   Specimen Description   Final    URINE, RANDOM Performed at [facility], [place] [address], [place], [place] [address]    Special Requests   Final    NONE Performed at [facility], [place] [address], [place], [place] [address]    Culture   Final    NO GROWTH Performed at [facility], [place] [address], [place], [place] [address]  Report Status 06/14/2018 FINAL  Final  Surgical pcr screen     Status: None   Collection Time: 06/15/18  3:49 PM  Result Value Ref Range Status   MRSA, PCR NEGATIVE NEGATIVE Final   Staphylococcus aureus NEGATIVE NEGATIVE Final    Comment: (NOTE) The Xpert SA Assay (FDA approved for NASAL specimens in patients 60 years of age and older), is one component of a comprehensive surveillance program. It is not intended to diagnose infection nor to guide or monitor treatment. Performed at [facility], [place] [address], [place], [place] [address]       Studies: No results found.  Scheduled Meds: . brimonidine  1 drop Right Eye Q12H   And  . timolol  1 drop Right Eye Q12H  . cycloSPORINE  1 drop Both Eyes BID  . fentaNYL      . heparin  5,000 Units Subcutaneous Q8H  . insulin aspart  0-9 Units Subcutaneous Q6H  . lip balm  1 application Topical BID    Continuous Infusions: . Marland KitchenTPN (CLINIMIX-E) Adult     And  . Fat emulsion    . sodium chloride 10 mL/hr at 06/16/18 0652  . sodium chloride    . cefoTEtan (CEFOTAN) IV    . chlorproMAZINE (THORAZINE) IV    . dextrose 5 % and 0.9% NaCl 35 mL/hr at 06/15/18 1756  .  dextrose 5 % and 0.9% NaCl    . lactated ringers    . lactated ringers 50 mL/hr at 06/16/18 0927  . metronidazole 500 mg (06/16/18 1347)  . potassium PHOSPHATE IVPB (in mmol)    . Marland KitchenTPN (CLINIMIX-E) Adult 40 mL/hr at 06/16/18 5208     LOS: 5 days     Alma Friendly, MD [facility]   If 7PM-7AM, please contact night-coverage [email or web address] Password [facility] 06/16/2018, 2:55 PM

## 2018-06-16 NOTE — Op Note (Signed)
06/16/2018  12:32 PM  PATIENT:  Kristin [name]  77 y.o. 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  Follow-up colonoscopy in year underwhelming.  Developed worsening abdominal pain crampiness and intermittent diarrhea.  Became fully obstructed.  CT scan and colonoscopy confirmed very thickened sigmoid stricture.  No endoluminal mass.  Diverticulitis suspected.  Decompression with rectal tube done with some improvement in colon size from massive distention.  I recommended laparoscopic possible open resection of the sigmoid stricture.  Noted probable need for colostomy or diversion versus anastomosis if not too inflamed.   The anatomy & physiology of the digestive tract was discussed.  The  pathophysiology was discussed.  Natural history risks without surgery was discussed.   I worked to give an overview of the disease and the frequent need to have multispecialty involvement.  I feel the risks of no intervention will lead to serious problems that outweigh the operative risks; therefore, I recommended a partial colectomy to remove the pathology.  Laparoscopic & open techniques were discussed.   Risks such as bleeding, infection, abscess, leak, reoperation, possible ostomy, hernia, heart attack, death, and other risks were discussed.  I noted a good likelihood this will help address the problem.   Goals of post-operative recovery were discussed as well.  We will work to minimize complications.  Educational materials on the pathology had been given in the office.  Questions were answered.    The patient expressed understanding & wished to proceed with surgery.  OR FINDINGS:   Patient had very inflamed rectosigmoid colon with dense adhesions to the left adnexa and uterus.  Significant inflammation and probable contained rectosigmoid left posterior perforation into the mesial rectum.  No discrete abscess but purulent mesentery and mesial rectum.  Patient has an end colostomy.  In the setting of contained perforation and significant purulent mesenteric inflammation, I do not feel it was safe to do primary anastomosis.  No obvious metastatic disease on visceral parietal peritoneum or liver.   DESCRIPTION:   Informed consent was confirmed.  The patient underwent general anaesthesia without difficulty.  The patient was positioned appropriately.  VTE prevention in place.  The pigtail rectal tube had been left in place.  The patient's abdomen was clipped, prepped, & draped in a sterile fashion.  Surgical timeout confirmed our plan.  The patient was positioned in reverse Trendelenburg.  Varies needle placed in the  left subcostal ridge over Palmer's point.  Being induced carbon dioxide  insufflation.  No elevation in end-tidal CO2.  Patient has a somewhat tight abdomen from her prior abdominoplasty but I did get appropriate distention symmetrically.  Abdominal entry was gained using optical entry technique in the right upper abdomen.  Entry was clean.  I induced carbon dioxide insufflation.  Camera inspection revealed no injury.  Extra ports were carefully placed under direct laparoscopic visualization.  The colon was only mildly distended.  Small bowel was moderately distended.  With some Trendelenburg positioning was able to get the bowel out to expose the rectosigmoid region.  It had some chronic thickening.  Very inflamed and densely adherent to the left ovary and uterus.  I reflected the greater omentum and the upper abdomen the small bowel in the upper abdomen.   Initially tried to do medial to lateral approach to elevate the rectosigmoid mesentery off the retroperitoneum.  I scored the base of peritoneum of the right side of the mesentery of the left colon from the ligament of Treitz to the peritoneal reflection of the mid rectum.  I elevated the sigmoid mesentery and enetered into the retro-mesenteric plane. We were able to identify the left ureter and gonadal vessels. We kept those posterior within the retroperitoneum and elevated the left colon mesentery off that. I did isolated IMA pedicle but did not ligate it yet.    I continued distally and encountered a very densely inflamed mesorectum especially in the left posterior pelvis.  Carefully alternated with focus blunt hydrodissection and sharp dissection thinned out planes and elevate the mesorectum and rectosigmoid colon off the posterior pelvis and left pelvic sidewall.  Had alternate with lateral anterior pelvic dissection as well I did encounter an obvious perforation with a few balls of stool in the mesorectum consistent with a contained perforation.  There is some purulence weeping through the inflamed rectosigmoid mesentery  but no definite abscess.  Eventually freed off anterior and sidewall adhesions.  Gradually I was able to get once I get distal to the inflammation to decompressed uninflamed mid rectum, I was able to get got into the avascular plane posterior to the mesorectum. This allowed me to help mobilize the rectum as well by freeing the mesorectum off the sacrum.  I mobilized the peritoneal coverings towards the peritoneal reflection on both the right and left sides of the rectum.  I could see the right and left ureters and stayed away from them.  I kept the lateral vascular pedicles to the rectum intact.  I was able to get to mid rectum that was not inflamed.  I was able to transect through uninflamed mesorectum to find a more healthy section several centimeters distal to the most distal end of the inflamed rectosigmoid segment.  I transected at the mid rectum using a laparoscopic stapler with a green load.  1 firing needed.  Because there was contained perforation, purulence, the patient being malnourished, history of prior cancers and chemotherapy; I did not feel like it was a good idea to do an anastomosis at this time.  I was concerned about stricturing and further inflammation and her poor ability to tolerate a loop ileostomy with her advanced age.  Therefore I decided to proceed with a more classic Hartmann resection.   I placed 2-0 Prolene sutures at each corner with laparoscopic intraportal suturing in hopes of future colostomy takedown.    The left IMA was somewhat shortened due to inflammation.  Given concerns of  reach and problems with colostomy takedown, I decided to take the inferior mesenteric pedicles.   I skeletonized the lymph nodes off the inferior mesenteric artery pedicle.  I went down to its takeoff from the aorta.  I isolated the inferior mesenteric vein off of the ligament of Treitz just cephalad to that as well.  After confirming the left ureter was out of the way, I went ahead and ligated the  inferior mesenteric artery pedicle with bipolar EnSeal just near its takeoff from the aorta.  I did ligate the inferior mesenteric vein in a similar fashion.  We ensured hemostasis.   I created an extraction incision through a left lower quadrant premarked colostomy site where the 12 mm stapler port had been placed.  Excised on the more seen for a 3 cm circular defect.  Open the anterior rectus fascia transversely.  Split through the rectus muscle and posterior rectus fascia and peritoneum vertically.  Placed a wound protector.  I was able to eviscerate the rectosigmoid and descending colon out the wound.  I transected in an area of the descending/sigmoid colon that was not particularly inflamed and several centimeters proximal to the stricture.  Try to preserve as much left colon for possible future colostomy takedown.  Transected the mesentery, sparing the blood supply since the etiology seem benign.  We changed gloves.  We did diagnostic laparoscopy.  Hemostasis good.  I ran the small bowel confirmed no allergy other abnormalities.  I placed antibiotic irrigation.  I placed a drain as noted above out a right mid abdominal port site and secured the skin with Prolene suture.  I tightened the fascia around the left lower quadrant colostomy site with 0 Vicryl interrupted suture at each corner.  I brought out the colon out 4 cm I closed the skin with some interrupted Monocryl stitches.  Sterile dressings placed.  After all dressings were placed, place clean towels.  I matured the colostomy in a Brooke fashion using interrupted 2-0 Vicryl suture to good result.  Got a 2 cm high rosebud.  Some edema and swelling but finger easily could intubate across the thin abdominal wall into the peritoneum.  Colostomy appliance placed.  Patient explained went to recovery room.  I discussed operative findings, updated the patient's status, discussed probable steps to recovery, and gave postoperative recommendations to the  patient's family.  Recommendations were made.  Questions were answered.  They expressed understanding & appreciation.   [name], M.D., F.A.C.S. Gastrointestinal and Minimally Invasive Surgery [facility] [address] [place], [place] [address] [phone] Main / Paging

## 2018-06-16 NOTE — Interval H&P Note (Signed)
History and Physical Interval Note:  06/16/2018 9:23 AM  Kristin [name]  has presented today for surgery, with the diagnosis of bowel obstruction  The various methods of treatment have been discussed with the patient and family. After consideration of risks, benefits and other options for treatment, the patient has consented to  Procedure(s): LAPAROSCOPIC SIGMOID COLECTOMY (N/A) POSSIBLE COLOSTOMY (N/A) as a surgical intervention .  The patient's history has been reviewed, patient examined, no change in status, stable for surgery.  I have reviewed the patient's chart and labs.  Questions were answered to the patient's satisfaction.    I have re-reviewed the the patient's records, history, medications, and allergies.  I have re-examined the patient.  I again discussed intraoperative plans and goals of post-operative recovery.  The patient agrees to proceed.  Kristin [name]  1941/03/17 [id number]  Patient Care Team: Eber Hong, MD as PCP - General (Internal Medicine) Michael Boston, MD as Consulting Physician (General Surgery) Armbruster, Carlota Raspberry, MD as Consulting Physician (Gastroenterology) Curt Bears, MD as Consulting Physician (Oncology) Herminio Commons, MD as Consulting Physician (Cardiology)  Patient Active Problem List   Diagnosis Date Noted  . 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 Malnutrition of moderate degree 06/15/2018  . CKD (chronic kidney disease), stage III ([place]) 06/11/2018  . Large bowel obstruction ([place]) 06/11/2018  . Acute lower UTI 06/11/2018  . Encounter for antineoplastic chemotherapy 10/28/2016  . Anemia of chronic disease 09/21/2016  . 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 S/P hip replacement, left 08/07/2016  . 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 Genetic testing 06/20/2016  . Family history of breast cancer   . Family history of colon cancer   .  Family history of brain cancer   . Family history of prostate cancer   . Melanocytic nevi of trunk 03/07/2016  . Seborrheic keratoses of trunk 03/06/2016  . Status post partial colectomy 03/06/2016  . 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 Osteoporosis 04/25/2015  . Disc disorder 05/30/2014  . Arthritis of left hip 05/30/2014  . Occipital cerebral infarction ([place]) 04/30/2013  . Hypertension 04/29/2013  . Nonspecific abnormal finding in stool contents 07/31/2011  . Esophageal reflux 07/31/2011  . Benign neoplasm of colon 07/31/2011  . Hernia, hiatal 07/31/2011  . Cancer of upper lobe of left lung ([place]) 06/13/2011  . Personal history of breast cancer 06/13/2011  . Diverticulosis of colon (without mention of hemorrhage) 06/13/2011  . Adenocarcinoma of lung ([place]) 01/01/2011  . Status of breast implant 05/20/2010  . Menopausal syndrome 07/03/2004  . Hypercholesterolemia 01/05/2002  . Absence of bladder continence 12/02/2000  . Depression 12/02/2000    Past Medical History:  Diagnosis Date  . Allergy   . Anemia   . Anemia of chronic disease 09/21/2016  . Anxiety   . Arthritis    arthritis- left hip  . Blood transfusion without reported diagnosis    transfusion- 3-4 yrs ago -found to be anemic on routine lab check  . Breast cancer ([place]) 2002   bilateral- bilateral mastectomies done.  . 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 CVA (cerebral infarction) 04/30/2013  . Encounter for antineoplastic chemotherapy 10/28/2016  . Family history of brain cancer   . Family history of breast cancer   . Family history of colon cancer 10/06/2009  . Family history of colon cancer   . Family history of prostate cancer   . GERD (  gastroesophageal reflux disease)   . Glaucoma   . History of bilateral mastectomy 05/20/2010  . Hypertension   . Lung cancer ([place]) dx'd 12/2010   last Ct scan "no lung cancer" showing 11'16 CT Chest Epic.  . Malignant neoplasm of  ascending colon ([place]) 01/29/2016  . Stroke [facility])    2 yrs ago-no residual  . Tubular adenoma of colon 07/2011   colon polyps ans reoccurence with malignancy found    Past Surgical History:  Procedure Laterality Date  . APPENDECTOMY    . BOWEL DECOMPRESSION N/A 06/12/2018   Procedure: BOWEL DECOMPRESSION;  Surgeon: Rush Landmark Telford Nab., MD;  Location: [facility];  Service: Gastroenterology;  Laterality: N/A;  . cataracts Bilateral   . COLONOSCOPY    . EYE SURGERY    . FLEXIBLE SIGMOIDOSCOPY N/A 06/12/2018   Procedure: FLEXIBLE SIGMOIDOSCOPY;  Surgeon: Rush Landmark Telford Nab., MD;  Location: [facility];  Service: Gastroenterology;  Laterality: N/A;  . LUNG SURGERY     Resection -" not done. Pt . tx with Tarceva  . MASTECTOMY Bilateral   . MEDIASTINOSCOPY N/A 10/02/2016   Procedure: MEDIASTINOSCOPY;  Surgeon: [name], MD;  Location: [facility];  Service: Thoracic;  Laterality: N/A;  . RECONSTRUCTION BREAST W/ TRAM FLAP Bilateral    bilaterally  . robotic right hemicolectomy Right 03/06/2016   Dr. Johney Maine  . TEE WITHOUT CARDIOVERSION N/A 06/04/2013   Procedure: TRANSESOPHAGEAL ECHOCARDIOGRAM (TEE);  Surgeon: Jolaine Artist, MD;  Location: [facility] END[facility];  Service: Cardiovascular;  Laterality: N/A;  . TONSILLECTOMY    . TOTAL HIP ARTHROPLASTY Left 08/07/2016   Procedure: LEFT TOTAL HIP ARTHROPLASTY ANTERIOR APPROACH;  Surgeon: Gaynelle Arabian, MD;  Location: [facility];  Service: Orthopedics;  Laterality: Left;    Social History   Socioeconomic History  . Marital status: Married    Spouse name: Not on file  . Number of children: 2  . Years of education: college  . Highest education level: Not on file  Occupational History  . Occupation: Retired    Fish farm manager: RETIRED  Social Needs  . Financial resource strain: Not on file  . Food insecurity:    Worry: Not on file    Inability: Not on file  . Transportation needs:    Medical: Not on file    Non-medical: Not on file   Tobacco Use  . Smoking status: Never Smoker  . Smokeless tobacco: Never Used  Substance and Sexual Activity  . Alcohol use: Yes    Alcohol/week: 0.0 standard drinks    Comment: ocassional beer  . Drug use: No  . Sexual activity: Not Currently  Lifestyle  . Physical activity:    Days per week: Not on file    Minutes per session: Not on file  . Stress: Not on file  Relationships  . Social connections:    Talks on phone: Not on file    Gets together: Not on file    Attends religious service: Not on file    Active member of club or organization: Not on file    Attends meetings of clubs or organizations: Not on file    Relationship status: Not on file  . Intimate partner violence:    Fear of current or ex partner: Not on file    Emotionally abused: Not on file    Physically abused: Not on file    Forced sexual activity: Not on file  Other Topics Concern  . Not on file  Social History Narrative   Patient lives  at home with her husband. Patient drinks caffinated  Drinks daily.    Family History  Problem Relation Age of Onset  . Lung cancer Father   . Heart disease Mother   . Clotting disorder Mother 45       coronary thrombosis  . Colon cancer Sister        dx in her 73s  . Breast cancer Sister 62  . Multiple myeloma Paternal Grandmother   . Brain cancer Paternal Uncle   . Cancer Paternal Aunt        unknown  . Colon cancer Maternal Aunt 80  . Prostate cancer Maternal Uncle        dx in his late 72s  . Other Maternal Uncle        brain tumor dx in his 19s  . Lung cancer Maternal Uncle   . Cancer Maternal Uncle        NOS  . Stomach cancer Paternal Aunt   . Brain cancer Cousin        maternal first cousin dx in late 21s-50s  . Cancer Paternal Uncle        NOS  . Colon cancer Cousin        paternal cousin dx in his 65s  . Cancer Cousin        paternal cousin dx with NOS cancer    Facility-Administered Medications Prior to Admission  Medication Dose Route  Frequency Provider Last Rate Last Dose  . 0.9 %  sodium chloride infusion  500 mL Intravenous Continuous Armbruster, Carlota Raspberry, MD       Medications Prior to Admission  Medication Sig Dispense Refill Last Dose  . alendronate (FOSAMAX) 70 MG tablet Take 70 mg once a week by mouth.   Past Month at Unknown time  . amLODipine (NORVASC) 5 MG tablet Take 5 mg by mouth daily. Per Pt -she takes 2.5 mg in am and 5 mg in pm   06/11/2018 at Unknown time  . brimonidine-timolol (COMBIGAN) 0.2-0.5 % ophthalmic solution Place 1 drop into the right eye every 12 (twelve) hours.   06/10/2018 at 2130  . cephALEXin (KEFLEX) 250 MG capsule Take 250 mg by mouth 4 (four) times daily.   06/11/2018 at Unknown time  . Cholecalciferol (VITAMIN D3) 2000 units capsule Take 2,000 Units daily by mouth.   Past Month at Unknown time  . clopidogrel (PLAVIX) 75 MG tablet Take 75 mg by mouth daily.   06/11/2018 at 1000  . cycloSPORINE (RESTASIS) 0.05 % ophthalmic emulsion Place 1 drop into both eyes 2 (two) times daily.   06/11/2018 at Unknown time  . escitalopram (LEXAPRO) 10 MG tablet Take 10 mg by mouth daily.    Past Week at Unknown time  . famotidine (PEPCID) 40 MG tablet Take 40 mg by mouth daily.   06/11/2018 at Unknown time  . letrozole (FEMARA) 2.5 MG tablet TAKE ONE TABLET EACH DAY (Patient taking differently: Take 2.5 mg by mouth daily. ) 30 tablet 3 Past Week at Unknown time  . lisinopril (PRINIVIL,ZESTRIL) 20 MG tablet Take 1 tablet by mouth 2 (two) times daily.   Past Week at Unknown time  . ondansetron (ZOFRAN-ODT) 4 MG disintegrating tablet Take 4 mg by mouth every 8 (eight) hours as needed for nausea.   06/11/2018 at Unknown time  . osimertinib mesylate (TAGRISSO) 80 MG tablet Take 1 tablet (80 mg total) by mouth daily. 30 tablet 0 06/11/2018 at 1000  . FeFum-FePoly-FA-B Cmp-C-Biot (FOLIVANE-PLUS) CAPS TAKE  ONE CAPSULE BY MOUTH EACH DAY (Patient not taking: Reported on 06/11/2018) 90 capsule 2 Not Taking at Unknown time     Current Facility-Administered Medications  Medication Dose Route Frequency Provider Last Rate Last Dose  . Marland KitchenTPN (CLINIMIX-E) Adult   Intravenous Continuous TPN Pham, Anh P, RPH       And  . Fat emulsion 20 % infusion 240 mL  240 mL Intravenous Continuous TPN Pham, Anh P, RPH      . [MAR Hold] 0.9 %  sodium chloride infusion   Intravenous PRN Alma Friendly, MD 10 mL/hr at 06/16/18 [phone]    . [MAR Hold] acetaminophen (TYLENOL) tablet 650 mg  650 mg Oral Q6H PRN Opyd, Ilene Qua, MD       Or  . Doug Sou Hold] acetaminophen (TYLENOL) suppository 650 mg  650 mg Rectal Q6H PRN Opyd, Ilene Qua, MD      . Doug Sou Hold] alum & mag hydroxide-simeth (MAALOX/MYLANTA) 200-200-20 MG/5ML suspension 30 mL  30 mL Oral Q6H PRN Michael Boston, MD      . Doug Sou Hold] bisacodyl (DULCOLAX) suppository 10 mg  10 mg Rectal Q12H PRN Michael Boston, MD      . Doug Sou Hold] brimonidine (ALPHAGAN) 0.2 % ophthalmic solution 1 drop  1 drop Right Eye Q12H Opyd, Ilene Qua, MD   1 drop at 06/15/18 2219   And  . [MAR Hold] timolol (TIMOPTIC) 0.5 % ophthalmic solution 1 drop  1 drop Right Eye Q12H Opyd, Ilene Qua, MD   1 drop at 06/15/18 2219  . bupivacaine liposome (EXPAREL) 1.3 % injection 266 mg  20 mL Infiltration Once Michael Boston, MD      . cefoTEtan (CEFOTAN) 2 g in sodium chloride 0.9 % 100 mL IVPB  2 g Intravenous On Call to OR Michael Boston, MD      . Doug Sou Hold] chlorproMAZINE (THORAZINE) 25 mg in sodium chloride 0.9 % 25 mL IVPB  25 mg Intravenous Q6H PRN Michael Boston, MD      . clindamycin (CLEOCIN) 900 mg, gentamicin (GARAMYCIN) 240 mg in sodium chloride 0.9 % 1,000 mL for intraperitoneal lavage   Intraperitoneal To OR Michael Boston, MD      . Doug Sou Hold] cycloSPORINE (RESTASIS) 0.05 % ophthalmic emulsion 1 drop  1 drop Both Eyes BID Opyd, Ilene Qua, MD   1 drop at 06/15/18 2219  . dextrose 5 %-0.9 % sodium chloride infusion   Intravenous Continuous Lynelle Doctor, RPH 35 mL/hr at 06/15/18 1756    . dextrose 5 %-0.9 %  sodium chloride infusion   Intravenous Continuous Pham, Anh P, RPH      . [MAR Hold] fentaNYL (SUBLIMAZE) injection 25-50 mcg  25-50 mcg Intravenous Q2H PRN Opyd, Ilene Qua, MD   25 mcg at 06/16/18 0715  . [MAR Hold] guaiFENesin-dextromethorphan (ROBITUSSIN DM) 100-10 MG/5ML syrup 10 mL  10 mL Oral Q4H PRN Michael Boston, MD      . Doug Sou Hold] heparin injection 5,000 Units  5,000 Units Subcutaneous Q8H Opyd, Ilene Qua, MD   Stopped at 06/15/18 2218  . [MAR Hold] hydrALAZINE (APRESOLINE) injection 5-10 mg  5-10 mg Intravenous Q4H PRN Michael Boston, MD      . Doug Sou Hold] hydrocortisone (ANUSOL-HC) 2.5 % rectal cream 1 application  1 application Topical QID PRN Michael Boston, MD      . Doug Sou Hold] hydrocortisone cream 1 % 1 application  1 application Topical TID PRN Michael Boston, MD      . Doug Sou Hold]  insulin aspart (novoLOG) injection 0-9 Units  0-9 Units Subcutaneous Q6H Royetta Asal, RPH   1 Units at 06/16/18 0544  . [MAR Hold] lactated ringers bolus 1,000 mL  1,000 mL Intravenous TID PRN Michael Boston, MD      . Doug Sou Hold] lip balm (CARMEX) ointment 1 application  1 application Topical BID Michael Boston, MD   1 application at 23/55/73 2219  . [MAR Hold] magic mouthwash  15 mL Oral QID PRN Michael Boston, MD      . Doug Sou Hold] menthol-cetylpyridinium (CEPACOL) lozenge 3 mg  1 lozenge Oral PRN Blount, Lolita Cram, NP      . Doug Sou Hold] metoCLOPramide (REGLAN) injection 5-10 mg  5-10 mg Intravenous Q8H PRN Michael Boston, MD      . Doug Sou Hold] metoprolol tartrate (LOPRESSOR) injection 5 mg  5 mg Intravenous Q6H PRN Michael Boston, MD      . Doug Sou Hold] metroNIDAZOLE (FLAGYL) IVPB 500 mg  500 mg Intravenous Q8H Rayburn, Kelly A, PA-C   Stopped at 06/16/18 [phone]  . [MAR Hold] ondansetron (ZOFRAN) injection 4 mg  4 mg Intravenous Q6H PRN Opyd, Ilene Qua, MD   4 mg at 06/16/18 0841  . [MAR Hold] phenol (CHLORASEPTIC) mouth spray 1-2 spray  1-2 spray Mouth/Throat PRN Michael Boston, MD      . Doug Sou Hold] potassium  chloride 10 mEq in 100 mL IVPB  10 mEq Intravenous Q1 Hr x 4 Pham, Anh P, RPH      . [MAR Hold] potassium PHOSPHATE 20 mmol in dextrose 5 % 500 mL infusion  20 mmol Intravenous Once Lynelle Doctor, RPH      . [MAR Hold] promethazine (PHENERGAN) tablet 12.5 mg  12.5 mg Oral Q6H PRN Opyd, Ilene Qua, MD      . Doug Sou Hold] sodium chloride flush (NS) 0.9 % injection 10-40 mL  10-40 mL Intracatheter PRN Alphonsa Overall, MD      . TPN (CLINIMIX-E) Adult   Intravenous Continuous TPN Royetta Asal, RPH 40 mL/hr at 06/16/18 5427       Allergies  Allergen Reactions  . Doxycycline Nausea Only    Weak, stomach  . Sulfa Antibiotics Rash    BP (!) 141/47 (BP Location: Left Arm)   Pulse 76   Temp 98.7 F (37.1 C) (Oral)   Resp 17   Ht 5' 6.5" (1.689 m)   Wt 65.6 kg   SpO2 97%   BMI 23.01 kg/m   Labs: Results for orders placed or performed during the hospital encounter of 06/11/18 (from the past 48 hour(s))  Hemoglobin A1c     Status: None   Collection Time: 06/15/18  3:25 AM  Result Value Ref Range   Hgb A1c MFr Bld 5.2 4.8 - 5.6 %    Comment: (NOTE) Pre diabetes:          5.7%-6.4% Diabetes:              >6.4% Glycemic control for   <7.0% adults with diabetes    Mean Plasma Glucose 102.54 mg/dL    Comment: Performed at [facility], [place] [address], [place], [place] [address]  CBC with Differential/Platelet     Status: Abnormal   Collection Time: 06/15/18  3:28 AM  Result Value Ref Range   WBC 7.1 4.0 - 10.5 K/uL   RBC 2.92 (L) 3.87 - 5.11 MIL/uL   Hemoglobin 8.6 (L) 12.0 - 15.0 g/dL   HCT 26.5 (L) 36.0 - 46.0 %  MCV 90.8 78.0 - 100.0 fL   MCH 29.5 26.0 - 34.0 pg   MCHC 32.5 30.0 - 36.0 g/dL   RDW 13.5 11.5 - 15.5 %   Platelets 396 150 - 400 K/uL   Neutrophils Relative % 68 %   Neutro Abs 4.9 1.7 - 7.7 K/uL   Lymphocytes Relative 14 %   Lymphs Abs 1.0 0.7 - 4.0 K/uL   Monocytes Relative 14 %   Monocytes Absolute 1.0 0.1 - 1.0 K/uL   Eosinophils Relative 4 %   Eosinophils  Absolute 0.3 0.0 - 0.7 K/uL   Basophils Relative 0 %   Basophils Absolute 0.0 0.0 - 0.1 K/uL    Comment: Performed at [facility], [place] [address], [place], [place] [address]  Basic metabolic panel     Status: Abnormal   Collection Time: 06/15/18  3:28 AM  Result Value Ref Range   Sodium 144 135 - 145 mmol/L   Potassium 3.2 (L) 3.5 - 5.1 mmol/L   Chloride 113 (H) 98 - 111 mmol/L   CO2 24 22 - 32 mmol/L   Glucose, Bld 106 (H) 70 - 99 mg/dL   BUN 15 8 - 23 mg/dL   Creatinine, Ser 0.91 0.44 - 1.00 mg/dL   Calcium 8.8 (L) 8.9 - 10.3 mg/dL   GFR calc non Af Amer 58 (L) >60 mL/min   GFR calc Af Amer >60 >60 mL/min    Comment: (NOTE) The eGFR has been calculated using the CKD EPI equation. This calculation has not been validated in all clinical situations. eGFR's persistently <60 mL/min signify possible Chronic Kidney Disease.    Anion gap 7 5 - 15    Comment: Performed at [facility], [place] [address], [place], [place] [address]  Iron and TIBC     Status: Abnormal   Collection Time: 06/15/18  3:28 AM  Result Value Ref Range   Iron 35 28 - 170 ug/dL   TIBC 156 (L) 250 - 450 ug/dL   Saturation Ratios 22 10.4 - 31.8 %   UIBC 121 ug/dL    Comment: Performed at [facility], [place] [address] [place], [place] [address]  Ferritin     Status: None   Collection Time: 06/15/18  3:28 AM  Result Value Ref Range   Ferritin 84 11 - 307 ng/mL    Comment: Performed at [facility], [place] [address], [place], [place] [address]  Surgical pcr screen     Status: None   Collection Time: 06/15/18  3:49 PM  Result Value Ref Range   MRSA, PCR NEGATIVE NEGATIVE   Staphylococcus aureus NEGATIVE NEGATIVE    Comment: (NOTE) The Xpert SA Assay (FDA approved for NASAL specimens in patients 9 years of age and older), is one component of a comprehensive surveillance program. It is not intended to diagnose infection nor to guide or  monitor treatment. Performed at [facility], [place] [address] [place], [place] [address]   Glucose, capillary     Status: Abnormal   Collection Time: 06/15/18  5:15 PM  Result Value Ref Range   Glucose-Capillary 120 (H) 70 - 99 mg/dL  Glucose, capillary     Status: Abnormal   Collection Time: 06/16/18 12:13 AM  Result Value Ref Range   Glucose-Capillary 131 (H) 70 - 99 mg/dL  CBC with Differential/Platelet     Status: Abnormal   Collection Time: 06/16/18  4:51 AM  Result Value Ref Range  WBC 7.1 4.0 - 10.5 K/uL   RBC 3.08 (L) 3.87 - 5.11 MIL/uL   Hemoglobin 9.1 (L) 12.0 - 15.0 g/dL   HCT 27.7 (L) 36.0 - 46.0 %   MCV 89.9 78.0 - 100.0 fL   MCH 29.5 26.0 - 34.0 pg   MCHC 32.9 30.0 - 36.0 g/dL   RDW 13.6 11.5 - 15.5 %   Platelets 338 150 - 400 K/uL   Neutrophils Relative % 68 %   Neutro Abs 4.8 1.7 - 7.7 K/uL   Lymphocytes Relative 12 %   Lymphs Abs 0.9 0.7 - 4.0 K/uL   Monocytes Relative 14 %   Monocytes Absolute 1.0 0.1 - 1.0 K/uL   Eosinophils Relative 6 %   Eosinophils Absolute 0.4 0.0 - 0.7 K/uL   Basophils Relative 0 %   Basophils Absolute 0.0 0.0 - 0.1 K/uL    Comment: Performed at [facility], [place] [address], [place], [place] [address]  Comprehensive metabolic panel     Status: Abnormal   Collection Time: 06/16/18  4:51 AM  Result Value Ref Range   Sodium 144 135 - 145 mmol/L   Potassium 3.3 (L) 3.5 - 5.1 mmol/L   Chloride 113 (H) 98 - 111 mmol/L   CO2 27 22 - 32 mmol/L   Glucose, Bld 130 (H) 70 - 99 mg/dL   BUN 11 8 - 23 mg/dL   Creatinine, Ser 0.75 0.44 - 1.00 mg/dL   Calcium 9.0 8.9 - 10.3 mg/dL   Total Protein 5.1 (L) 6.5 - 8.1 g/dL   Albumin 2.4 (L) 3.5 - 5.0 g/dL   AST 68 (H) 15 - 41 U/L   ALT 30 0 - 44 U/L   Alkaline Phosphatase 68 38 - 126 U/L   Total Bilirubin 0.4 0.3 - 1.2 mg/dL   GFR calc non Af Amer >60 >60 mL/min   GFR calc Af Amer >60 >60 mL/min    Comment: (NOTE) The eGFR has been calculated using the  CKD EPI equation. This calculation has not been validated in all clinical situations. eGFR's persistently <60 mL/min signify possible Chronic Kidney Disease.    Anion gap 4 (L) 5 - 15    Comment: Performed at [facility], [place] [address] [place], [place] [address]  Prealbumin     Status: Abnormal   Collection Time: 06/16/18  4:51 AM  Result Value Ref Range   Prealbumin 6.5 (L) 18 - 38 mg/dL    Comment: Performed at [place] [address] [place], [place] [address]  Magnesium     Status: None   Collection Time: 06/16/18  4:51 AM  Result Value Ref Range   Magnesium 2.0 1.7 - 2.4 mg/dL    Comment: Performed at [facility], [place] [address] [place], [place] [address]  Phosphorus     Status: Abnormal   Collection Time: 06/16/18  4:51 AM  Result Value Ref Range   Phosphorus 2.0 (L) 2.5 - 4.6 mg/dL    Comment: Performed at [facility], [place] [address] [place], [place] [address]  Triglycerides     Status: None   Collection Time: 06/16/18  4:51 AM  Result Value Ref Range   Triglycerides 99 <150 mg/dL    Comment: Performed at [facility], [place] [address], [place],  [address]  Type and screen [place]     Status: None   Collection Time: 06/16/18  4:51 AM  Result Value Ref Range   ABO/RH(D) B POS    Antibody Screen NEG    Sample Expiration      06/19/2018 Performed at [facility], [place] [address], [place], [place] [address]   Glucose, capillary     Status: Abnormal   Collection Time: 06/16/18  5:41 AM  Result Value Ref Range   Glucose-Capillary 131 (H) 70 - 99 mg/dL    Imaging / Studies: Dg Abd 1 View  Result Date: 06/14/2018 CLINICAL DATA:  Obstruction of bowel. EXAM: ABDOMEN - 1 VIEW COMPARISON:  Radiograph yesterday at 0900 hour, CT 06/11/2018 FINDINGS: Tip of the enteric tube in the stomach. Rectal catheter in place. No evidence of free air.  Persistent marked gaseous distension of bowel in the central lower abdomen currently 12.2 cm, previously 13.4 cm. Air within nondistended distal colon and rectum. Single prominent small bowel loop in the right abdomen. No other small bowel dilatation. Suspect small pleural effusions and basilar atelectasis. Calcified uterine fibroid. IMPRESSION: No significant change in the significantly distended bowel in the central lower abdomen, likely transverse colon. Air within nondistended colon distally with rectal catheter in place. Single prominent air-filled small bowel loop in the right lower abdomen without other small bowel dilatation. Electronically Signed   By: Jeb Levering M.D.   On: 06/14/2018 04:12   Dg Abd 1 View - Kub  Result Date: 06/12/2018 CLINICAL DATA:  Flexible sigmoidoscopy with bowel decompression. EXAM: ABDOMEN - 1 VIEW COMPARISON:  04/12/2018. FINDINGS: Sigmoid disco, catheter, and guidewire noted in the rectosigmoid. Eight images obtained. 6 minutes 36 seconds fluoroscopy time utilized. Radiation dose 129.5 mGy. IMPRESSION: Procedure findings as above. Electronically Signed   By: Marcello Moores  Register   On: 06/12/2018 15:08   Dg Abd 1 View  Result Date: 06/12/2018 CLINICAL DATA:  Nasogastric tube placement. EXAM: ABDOMEN - 1 VIEW COMPARISON:  Radiographs earlier the same date.  CT 06/11/2018. FINDINGS: Enteric tube is visualized within the midline of the upper abdomen, consistent with location in the mid stomach as correlated with recent CT. There is a moderate size hiatal hernia. There is persistent marked dilatation of the transverse colon status post right hemicolectomy. The visualized small bowel also appears mildly dilated. No supine evidence of free intraperitoneal air. Previous left total hip arthroplasty noted. IMPRESSION: Enteric tube extends to the level of the mid stomach. Persistent findings consistent with distal colonic obstruction. Electronically Signed   By: Richardean Sale  M.D.   On: 06/12/2018 13:01   Ct Abdomen Pelvis W Contrast  Result Date: 06/11/2018 CLINICAL DATA:  Persistent lower abdominal pain and pressure. History of right colectomy for ascending colon cancer. EXAM: CT ABDOMEN AND PELVIS WITH CONTRAST TECHNIQUE: Multidetector CT imaging of the abdomen and pelvis was performed using the standard protocol following bolus administration of intravenous contrast. CONTRAST:  53m ISOVUE-300 IOPAMIDOL (ISOVUE-300) INJECTION 61% COMPARISON:  05/04/2018 chest CT FINDINGS: Lower chest: Normal heart size. No pericardial effusion. Bibasilar dependent atelectasis. Moderate-sized hiatal hernia. Hepatobiliary: Stable 2.6 cm cyst in the right hepatic lobe. No enhancing mass lesions of the liver. Physiologic distention of the gallbladder without mural thickening or pericholecystic fluid. No biliary dilatation. Pancreas: No ductal dilatation, mass inflammation.  Mild atrophy. Spleen: No splenomegaly or mass. Adrenals/Urinary Tract: Normal bilateral adrenal glands. Symmetric cortical enhancement of both kidneys with small interpolar cyst in the right kidney unchanged in appearance measuring approximately 4 mm. No nephrolithiasis nor hydroureteronephrosis. Urinary bladder is physiologically distended without focal mural thickening or calculi. Stomach/Bowel:  Moderate-sized hiatal hernia. Nondistended stomach. Diffuse fluid-filled distended small and large bowel loops are identified to the level of the rectosigmoid where there is segmental transmural thickening noted causing large and small bowel obstruction. Dilatation of the colon to 9 cm is noted. The patient is status post right colectomy. Although findings may be secondary to a severe colitis, neoplasm is of concern. Vascular/Lymphatic: Aortoiliac atherosclerosis without aneurysm. No adenopathy. Reproductive: Fibroid uterus.  No adnexal mass. Other: No free air nor free fluid. Musculoskeletal: Degenerative changes are noted along the  dorsal spine without acute nor aggressive osseous lesions. IMPRESSION: 1. Short segmental thickening in the rectosigmoid causing large and small-bowel obstruction. Clinical correlation to exclude neoplasm is recommended. Marked inflammatory thickening is a differential possibility. Dilatation of fluid-filled large bowel is noted to 9 cm. 2. Status post right partial colectomy.  Intact staple line. 3. Stable 2.6 cm in the right hepatic lobe compatible with a cyst. 4. Moderate-sized hiatal hernia. Electronically Signed   By: Ashley Royalty M.D.   On: 06/11/2018 20:49   Dg Abd 2 Views  Result Date: 06/13/2018 CLINICAL DATA:  Abdominal pain, diarrhea, colonic obstruction EXAM: ABDOMEN - 2 VIEW COMPARISON:  Abdominal radiograph from earlier today FINDINGS: Enteric tube terminates in distal stomach. Rectal catheter terminates in left deep pelvis. No evidence of pneumatosis or pneumoperitoneum. Persistent marked gaseous distension of remnant large bowel measuring up to 13.4 cm diameter in the proximal colon, not appreciably changed. No disproportionately dilated small bowel loops. No radiopaque nephrolithiasis. Mild bibasilar scarring versus atelectasis. Left total hip arthroplasty. IMPRESSION: 1. Enteric tube terminates in the distal stomach. 2. Rectal catheter terminates in the left deep pelvis probably near the rectosigmoid junction. 3. No appreciable change in marked gaseous distention of the remnant large bowel, most prominent in the proximal remnant colon. No evidence of free air. Electronically Signed   By: Ilona Sorrel M.D.   On: 06/13/2018 10:24   Dg Abd Portable 1v  Result Date: 06/13/2018 CLINICAL DATA:  Followup large bowel obstruction. EXAM: PORTABLE ABDOMEN - 1 VIEW COMPARISON:  06/12/2018 FINDINGS: Nasogastric tube tip in the antrum of the stomach. Small bore drainage catheter inserted per rectum with its tip in the sigmoid region. Dilated postsurgical proximal colon again demonstrated, maximal diameter  14 cm today, the same as yesterday. Small bowel pattern is unremarkable. IMPRESSION: Sigmoid decompression tube in place. Continued gaseous distension of the postsurgical proximal colon with maximal diameter 14 cm Electronically Signed   By: Nelson Chimes M.D.   On: 06/13/2018 06:58   Dg Abd Portable 1v  Result Date: 06/12/2018 CLINICAL DATA:  Abdominal pain and diarrhea EXAM: PORTABLE ABDOMEN - 1 VIEW COMPARISON:  CT abdomen and pelvis June 11, 2018 FINDINGS: There is dilatation of most of the colon to the level of the mid sigmoid colon where there is abrupt termination of the contrast column. Small bowel loops are largely fluid-filled. No air-fluid levels. No appreciable free air. There are uterine calcifications consistent with leiomyomatous change. Bones are osteoporotic. There is a total hip replacement on the left. IMPRESSION: Findings indicative of distal colonic obstruction. Most small bowel loops are fluid filled. There is colonic distention proximal to the sigmoid colon, similar to 1 day prior. No free air is appreciable. 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 Total hip replacement on the left. Electronically Signed   By: Lowella Grip III M.D.   On: 06/12/2018 09:15   Dg C-arm 1-60 Min-no Report  Result Date: 06/12/2018 Fluoroscopy was utilized  by the requesting physician.  No radiographic interpretation.   [place] Ekg Site Rite  Result Date: 06/13/2018 If Site Rite image not attached, placement could not be confirmed due to current cardiac rhythm.    [name], M.D., F.A.C.S. Gastrointestinal and Minimally Invasive Surgery [facility] [address] [place], [place] [address] [phone] Main / Paging  06/16/2018 9:24 AM     [name]

## 2018-06-16 NOTE — Anesthesia Preprocedure Evaluation (Addendum)
Anesthesia Evaluation  Patient identified by MRN, date of birth, ID band Patient awake    Reviewed: Allergy & Precautions, NPO status , Patient's Chart, lab work & pertinent test results  Airway Mallampati: II  TM Distance: >3 FB Neck ROM: Full    Dental  (+) Teeth Intact, Dental Advisory Given, Missing,    Pulmonary  H/o lung cancer    Pulmonary exam normal breath sounds clear to auscultation       Cardiovascular hypertension, Pt. on medications Normal cardiovascular exam Rhythm:Regular Rate:Normal  Echo 02/01/16: Impressions:  - Mild basal septal LV hypertrophy with LVEF 60-65%. Grade 1 diastolic dysfunction with normal estimated LV filling pressure. Trivial mitral regurgitation. Mild aortic annular calcification with trivial aortic regurgitation. Trivial tricuspid regurgitation, PASP estimated 27 - 53 mmHg based on incomplete TR Doppler envelope.   Neuro/Psych PSYCHIATRIC DISORDERS Anxiety Depression CVA, No Residual Symptoms    GI/Hepatic Neg liver ROS, hiatal hernia, GERD  Medicated,Bowel obstruction    Endo/Other  negative endocrine ROS  Renal/GU Renal InsufficiencyRenal disease     Musculoskeletal  (+) Arthritis ,   Abdominal   Peds  Hematology  (+) Blood dyscrasia (Plavix), anemia ,   Anesthesia Other Findings Day of surgery medications reviewed with the patient.  Breast cancer s/p bilateral mastectomies  Reproductive/Obstetrics                            Anesthesia Physical Anesthesia Plan  ASA: III  Anesthesia Plan: General   Post-op Pain Management:    Induction: Intravenous and Rapid sequence  PONV Risk Score and Plan: 4 or greater and Dexamethasone, Ondansetron and Treatment may vary due to age or medical condition  Airway Management Planned: Oral ETT  Additional Equipment:   Intra-op Plan:   Post-operative Plan: Possible Post-op  intubation/ventilation  Informed Consent: I have reviewed the patients History and Physical, chart, labs and discussed the procedure including the risks, benefits and alternatives for the proposed anesthesia with the patient or authorized representative who has indicated his/her understanding and acceptance.   Dental advisory given  Plan Discussed with: CRNA  Anesthesia Plan Comments:         Anesthesia Quick Evaluation

## 2018-06-16 NOTE — Transfer of Care (Signed)
Immediate Anesthesia Transfer of Care Note  Patient: Kristin [name]  Procedure(s) Performed: LAPAROSCOPIC LOW ANTERIOR RESECTION (N/A Abdomen) POSSIBLE COLOSTOMY (N/A ) CREATION/HARTMANN COLOSTOMY (Abdomen)  Patient Location: PACU  Anesthesia Type:General  Level of Consciousness: awake, alert  and patient cooperative  Airway & Oxygen Therapy: Patient Spontanous Breathing and Patient connected to face mask oxygen  Post-op Assessment: Report given to RN and Post -op Vital signs reviewed and stable  Post vital signs: Reviewed and stable  Last Vitals:  Vitals Value Taken Time  BP 137/63 06/16/2018 12:40 PM  Temp    Pulse 71 06/16/2018 12:43 PM  Resp 18 06/16/2018 12:43 PM  SpO2 100 % 06/16/2018 12:43 PM  Vitals shown include unvalidated device data.  Last Pain:  Vitals:   06/16/18 0915  TempSrc:   PainSc: 0-No pain      Patients Stated Pain Goal: 2 (37/48/27 0786)  Complications: No apparent anesthesia complications

## 2018-06-17 ENCOUNTER — Encounter (HOSPITAL_COMMUNITY): Payer: Self-pay | Admitting: Surgery

## 2018-06-17 ENCOUNTER — Encounter (HOSPITAL_COMMUNITY): Payer: Medicare Other

## 2018-06-17 LAB — CBC WITH DIFFERENTIAL/PLATELET
Basophils Absolute: 0 10*3/uL (ref 0.0–0.1)
Basophils Relative: 0 %
EOS PCT: 0 %
Eosinophils Absolute: 0 10*3/uL (ref 0.0–0.7)
HCT: 28.6 % — ABNORMAL LOW (ref 36.0–46.0)
Hemoglobin: 9.4 g/dL — ABNORMAL LOW (ref 12.0–15.0)
LYMPHS ABS: 0.9 10*3/uL (ref 0.7–4.0)
LYMPHS PCT: 6 %
MCH: 29.7 pg (ref 26.0–34.0)
MCHC: 32.9 g/dL (ref 30.0–36.0)
MCV: 90.2 fL (ref 78.0–100.0)
MONO ABS: 1.5 10*3/uL — AB (ref 0.1–1.0)
Monocytes Relative: 10 %
Neutro Abs: 12.3 10*3/uL — ABNORMAL HIGH (ref 1.7–7.7)
Neutrophils Relative %: 84 %
PLATELETS: 286 10*3/uL (ref 150–400)
RBC: 3.17 MIL/uL — AB (ref 3.87–5.11)
RDW: 13.6 % (ref 11.5–15.5)
WBC: 14.7 10*3/uL — AB (ref 4.0–10.5)

## 2018-06-17 LAB — MAGNESIUM: Magnesium: 2.1 mg/dL (ref 1.7–2.4)

## 2018-06-17 LAB — BASIC METABOLIC PANEL
Anion gap: 7 (ref 5–15)
BUN: 17 mg/dL (ref 8–23)
CO2: 25 mmol/L (ref 22–32)
Calcium: 8.7 mg/dL — ABNORMAL LOW (ref 8.9–10.3)
Chloride: 113 mmol/L — ABNORMAL HIGH (ref 98–111)
Creatinine, Ser: 0.95 mg/dL (ref 0.44–1.00)
GFR calc Af Amer: >60 mL/min (ref >60)
GFR, EST NON AFRICAN AMERICAN: 55 mL/min — AB (ref >60)
GLUCOSE: 177 mg/dL — AB (ref 70–99)
POTASSIUM: 3.7 mmol/L (ref 3.5–5.1)
Sodium: 145 mmol/L (ref 135–145)

## 2018-06-17 LAB — GLUCOSE, CAPILLARY
GLUCOSE-CAPILLARY: 141 mg/dL — AB (ref 70–99)
GLUCOSE-CAPILLARY: 115 mg/dL — AB (ref 70–99)
Glucose-Capillary: 131 mg/dL — ABNORMAL HIGH (ref 70–99)
Glucose-Capillary: 175 mg/dL — ABNORMAL HIGH (ref 70–99)

## 2018-06-17 LAB — PHOSPHORUS: Phosphorus: 2.9 mg/dL (ref 2.5–4.6)

## 2018-06-17 MED ORDER — PIPERACILLIN-TAZOBACTAM 3.375 G IVPB
3.3750 g | Freq: Three times a day (TID) | INTRAVENOUS | Status: DC
  Administered 2018-06-17 – 2018-06-24 (×22): 3.375 g via INTRAVENOUS
  Filled 2018-06-17 (×21): qty 50

## 2018-06-17 MED ORDER — TRACE MINERALS CR-CU-MN-SE-ZN 10-1000-500-60 MCG/ML IV SOLN
INTRAVENOUS | Status: AC
  Administered 2018-06-17: 17:00:00 via INTRAVENOUS
  Filled 2018-06-17: qty 1800

## 2018-06-17 MED ORDER — POTASSIUM CHLORIDE 10 MEQ/100ML IV SOLN
10.0000 meq | INTRAVENOUS | Status: AC
  Administered 2018-06-17 (×2): 10 meq via INTRAVENOUS
  Filled 2018-06-17: qty 100

## 2018-06-17 MED ORDER — FAT EMULSION PLANT BASED 20 % IV EMUL
240.0000 mL | INTRAVENOUS | Status: AC
  Administered 2018-06-17: 240 mL via INTRAVENOUS
  Filled 2018-06-17: qty 250

## 2018-06-17 MED ORDER — SODIUM CHLORIDE 0.9 % IV SOLN
INTRAVENOUS | Status: DC
  Administered 2018-06-17: 09:00:00 via INTRAVENOUS

## 2018-06-17 NOTE — Consult Note (Signed)
[facility] Nurse ostomy consult note Stoma type/location: LLQ colostomy Stomal assessment/size: 1 3/4" pink patent and moist, well budded colostomy.  Bleeds with cleaning Peristomal assessment: intact Treatment options for stomal/peristomal skin: none Output scant stoma sweat in pouch Ostomy pouching: 1 piece convex  Education provided: Daughter, son and spouse at bedside.  Observed pouch change.  Discussed measuring, cutting and care of stoma.  Covered twice weekly pouch changes, showering and activity level as tolerated.  Enrolled patient in [place] program: Yes will today [place] team will follow.  Domenic Moras RN MSN [facility] Pager [phone]

## 2018-06-17 NOTE — Progress Notes (Signed)
Nutrition Follow-up  DOCUMENTATION CODES:   Non-severe (moderate) malnutrition in context of acute illness/injury  INTERVENTION:   TPN per Pharmacy Will monitor for diet advancements  NUTRITION DIAGNOSIS:   Moderate Malnutrition related to acute illness(large bowel obstruction) as evidenced by moderate fat depletion, moderate muscle depletion, energy intake < 75% for > 7 days.  Ongoing.  GOAL:   Patient will meet greater than or equal to 90% of their needs  Progressing with TPN.  MONITOR:   Diet advancement, Labs, Weight trends, I & O's(TPN)  ASSESSMENT:   77 y.o. 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 8/27: s/p LAPAROSCOPIC SIGMOID COLECTOMY , colostomy, LOA  TPN continues to advance towards goal per Pharmacy note. Per surgery, will continue to be NPO with NGT (output: 650 ml so far today). Pt with no bowel function at this time.  Weight is now +12 lb since 8/23.   Medications: Reglan PRN, IV Zofran PRN Lab reviewed: CBGs: 141-175  Diet Order:   Diet Order            Diet NPO time specified Except for: Ice Chips, Sips with Meds  Diet effective now              EDUCATION NEEDS:   Education needs have been addressed  Skin:  Skin Assessment: Reviewed RN Assessment  Last BM:  8/25  Height:   Ht Readings from Last 1 Encounters:  06/16/18 5' 6.5" (1.689 m)    Weight:   Wt Readings from Last 1 Encounters:  06/17/18 71 kg    Ideal Body Weight:  67.7 kg  BMI:  Body mass index is 24.89 kg/m.  Estimated Nutritional Needs:   Kcal:  1800-2000  Protein:  80-90g  Fluid:  2L/day  Clayton Bibles, MS, RD, LDN [place]  Dietitian Pager: [phone] After Hours Pager: [phone]

## 2018-06-17 NOTE — Anesthesia Postprocedure Evaluation (Signed)
Anesthesia Post Note  Patient: Kristin [name]  Procedure(s) Performed: LAPAROSCOPIC LOW ANTERIOR RESECTION (N/A Abdomen) POSSIBLE COLOSTOMY (N/A ) CREATION/HARTMANN COLOSTOMY (Abdomen)     Patient location during evaluation: PACU Anesthesia Type: General Level of consciousness: awake and alert, awake and oriented Pain management: pain level controlled Vital Signs Assessment: post-procedure vital signs reviewed and stable Respiratory status: spontaneous breathing, nonlabored ventilation and respiratory function stable Cardiovascular status: blood pressure returned to baseline and stable Postop Assessment: no apparent nausea or vomiting Anesthetic complications: no    Last Vitals:  Vitals:   06/17/18 0945 06/17/18 1509  BP: 115/61 (!) 119/56  Pulse: 85 93  Resp: 16 18  Temp: 37 C 37.1 C  SpO2: 100% 100%    Last Pain:  Vitals:   06/17/18 1335  TempSrc:   PainSc: 4                  Catalina Gravel

## 2018-06-17 NOTE — Progress Notes (Signed)
OT Cancellation Note  Patient Details Name: Patton Swisher MRN: [id number] DOB: 04-30-1941   Cancelled Treatment:    Reason Eval/Treat Not Completed: Fatigue/lethargy limiting ability to participate  Pt just got back to bed after sitting up in chair. Will check back tomorrow.   Junell Cullifer 06/17/2018, 2:29 PM  Lesle Chris, OTR/L [phone] 06/17/2018

## 2018-06-17 NOTE — Progress Notes (Signed)
PHARMACY - ADULT TOTAL PARENTERAL NUTRITION CONSULT NOTE   Pharmacy Consult for TPN Indication: Bowel obstruction  Patient Measurements: Height: 5' 6.5" (168.9 cm) Weight: 156 lb 8.4 oz (71 kg) IBW/kg (Calculated) : 60.45 TPN AdjBW (KG): 65.6 Body mass index is 24.89 kg/m. Usual Weight:   Insulin Requirements: on sensitive SSI q6h (used 6 units since TPN rate increased to 60 ml/hr yesterday)  Current Nutrition: TPN - has NGT  IVF: D5NS at 35 ml/hr  Central access: PICC placed on 8/24 TPN start date: 8/26  ASSESSMENT                                                                                                          HPI: Patient's a 77 y.o F with hx lung cancer, breast cancer, and adenoma of the ascending colon (s/p colectomy), presented to the ED on 06/11/18 with c/o abdominal pain and n/v.  Abd CT on 8/22 showed "short segmental thickening in the rectosigmoid causing large and small-bowel obstruction."  She had sigmoidoscopy on 8/23 that showed stenosis in the sigmoid colon with decompression tube placed.  TPN started on 8/26 for bowel obstruction.  Significant events:  - 8/27: low anterior hartmann resection, end colostomy, lysis of adhesions  Today:   Glucose (goal <150): no hx DM; 175-196 (since TPN rate increased to 60 ml/hr yesterday)  Electrolytes: K 3.7 (KCL x4 runs on 8/27 not charted as given), phos 2.9 (kphos 20 mmolx1 on 8/27); CorrCa 9.98, Mag 2.1  Renal: scr ok  LFTs: AST slightly elevated at 68, Tbili wnl  TGs: 99 (8/27)  Prealbumin: 6.5 (8/27)  NUTRITIONAL GOALS                                                                                             RD recs (8/26): Kcal:  1800-2000 Protein:  80-90g  Clinimix E 5/15 at a goal rate of 75 ml/hr + 20% fat emulsion at 51ml/hr for 12hrs to provide: 90 g/day protein, 1758 Kcal/day.  PLAN  Now:  - potassium chloride 10 meq x2 runs - change IVF to NS  At 1800 today:  Increase Clinimix E 5/15 to goal rate 75 ml/hr.  20% fat emulsion at 20 ml/hr x12 hrs.  Add pepcid 40 mg to TPN bag  Plan to advance as tolerated to the goal rate.  TPN to contain standard multivitamins and trace elements.  Change IVF to NS. Continue KVO rate  Continue SSI q6h  TPN lab panels on Mondays & Thursdays.  F/u daily.  Dia Sitter P 06/17/2018,7:04 AM

## 2018-06-17 NOTE — Progress Notes (Signed)
PT Cancellation Note  Patient Details Name: Ermelinda Eckert MRN: [id number] DOB: [birth year]   Cancelled Treatment:    Reason Eval/Treat Not Completed: Fatigue/lethargy limiting ability to participate   Weston Anna, MPT Pager: [phone]

## 2018-06-17 NOTE — Progress Notes (Signed)
[facility] Progress Note  1 Day Post-Op  Subjective: CC-  Daughter at bedside. States that she slept well last night. Pain well controlled. Denies n/v. Trace fluid from colostomy but no air or stool. NG tube with about 700cc output in canister.  Has gotten OOB to [facility] without any issues.  Objective: Vital signs in last 24 hours: Temp:  [97.6 F (36.4 C)-99.8 F (37.7 C)] 99 F (37.2 C) (08/28 0604) Pulse Rate:  [72-99] 81 (08/28 0604) Resp:  [14-23] 16 (08/28 0604) BP: (107-135)/(47-66) 119/54 (08/28 0604) SpO2:  [93 %-100 %] 98 % (08/28 0604) Weight:  [65.6 kg-71 kg] 71 kg (08/28 0614) Last BM Date: 06/16/18  Intake/Output from previous day: 08/27 0701 - 08/28 0700 In: 3800.4 [P.O.:110; I.V.:2990; IV Piggyback:700.4] Out: 3320 [Urine:1250; Emesis/NG output:600; Drains:695; Stool:675; Blood:100] Intake/Output this shift: Total I/O In: 0  Out: 520 [Urine:500; Drains:20]  PE: Gen:  Alert, NAD, pleasant HEENT: EOM's intact, pupils equal and round Card:  RRR Pulm:  CTAB, no W/R/R, effort normal Abd: Soft, ND, appropriately tender, +BS, lap incisions with cdi dressings, ostomy with serosanguinous fluid in bag, JP drain SS Psych: A&Ox3  Skin: no rashes noted, warm and dry  Lab Results:  Recent Labs    06/16/18 0451 06/17/18 0327  WBC 7.1 14.7*  HGB 9.1* 9.4*  HCT 27.7* 28.6*  PLT 338 286   BMET Recent Labs    06/16/18 0451 06/17/18 0327  NA 144 145  K 3.3* 3.7  CL 113* 113*  CO2 27 25  GLUCOSE 130* 177*  BUN 11 17  CREATININE 0.75 0.95  CALCIUM 9.0 8.7*   PT/INR No results for input(s): LABPROT, INR in the last 72 hours. CMP     Component Value Date/Time   NA 145 06/17/2018 0327   NA 138 09/26/2017 0942   K 3.7 06/17/2018 0327   K 5.2 No visable hemolysis (H) 09/26/2017 0942   CL 113 (H) 06/17/2018 0327   CL 107 04/05/2013 0754   CO2 25 06/17/2018 0327   CO2 26 09/26/2017 0942   GLUCOSE 177 (H) 06/17/2018 0327   GLUCOSE 93  09/26/2017 0942   GLUCOSE 101 (H) 04/05/2013 0754   BUN 17 06/17/2018 0327   BUN 16.0 09/26/2017 0942   CREATININE 0.95 06/17/2018 0327   CREATININE 1.27 (H) 05/04/2018 1121   CREATININE 1.2 (H) 09/26/2017 0942   CALCIUM 8.7 (L) 06/17/2018 0327   CALCIUM 9.9 09/26/2017 0942   PROT 5.1 (L) 06/16/2018 0451   PROT 6.6 09/26/2017 0942   ALBUMIN 2.4 (L) 06/16/2018 0451   ALBUMIN 3.6 09/26/2017 0942   AST 68 (H) 06/16/2018 0451   AST 17 05/04/2018 1121   AST 18 09/26/2017 0942   ALT 30 06/16/2018 0451   ALT 15 05/04/2018 1121   ALT 13 09/26/2017 0942   ALKPHOS 68 06/16/2018 0451   ALKPHOS 68 09/26/2017 0942   BILITOT 0.4 06/16/2018 0451   BILITOT 0.3 05/04/2018 1121   BILITOT 0.31 09/26/2017 0942   GFRNONAA 55 (L) 06/17/2018 0327   GFRNONAA 39 (L) 05/04/2018 1121   GFRAA >60 06/17/2018 0327   GFRAA 45 (L) 05/04/2018 1121   Lipase     Component Value Date/Time   LIPASE 55 (H) 06/11/2018 1856       Studies/Results: No results found.  Anti-infectives: Anti-infectives (From admission, onward)   Start     Dose/Rate Route Frequency Ordered Stop   06/17/18 0800  piperacillin-tazobactam (ZOSYN) IVPB 3.375 g  3.375 g 12.5 mL/hr over 240 Minutes Intravenous Every 8 hours 06/17/18 0726     06/16/18 2200  cefoTEtan (CEFOTAN) 2 g in sodium chloride 0.9 % 100 mL IVPB     2 g 200 mL/hr over 30 Minutes Intravenous Every 12 hours 06/16/18 1339 06/17/18 0017   06/16/18 1206  clindamycin (CLEOCIN) 900 mg, gentamicin (GARAMYCIN) 240 mg in sodium chloride 0.9 % 1,000 mL for intraperitoneal lavage  Status:  Discontinued       As needed 06/16/18 1206 06/16/18 1235   06/16/18 0600  cefoTEtan (CEFOTAN) 2 g in sodium chloride 0.9 % 100 mL IVPB     2 g 200 mL/hr over 30 Minutes Intravenous On call to O.R. 06/15/18 1301 06/16/18 1027   06/16/18 0600  clindamycin (CLEOCIN) 900 mg, gentamicin (GARAMYCIN) 240 mg in sodium chloride 0.9 % 1,000 mL for intraperitoneal lavage  Status:  Discontinued       Intraperitoneal To Surgery 06/15/18 1301 06/16/18 1333   06/12/18 1700  metroNIDAZOLE (FLAGYL) IVPB 500 mg  Status:  Discontinued     500 mg 100 mL/hr over 60 Minutes Intravenous Every 8 hours 06/12/18 1604 06/17/18 0725   06/11/18 2245  cefTRIAXone (ROCEPHIN) 1 g in sodium chloride 0.9 % 100 mL IVPB  Status:  Discontinued     1 g 200 mL/hr over 30 Minutes Intravenous Daily at bedtime 06/11/18 2204 06/15/18 1030       Assessment/Plan H/o colon/lung/breast cancer - currently on chemo, followed by Dr. Julien Nordmann S/p robotic right hemicolectomy in May 2017 by Dr. 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 Continue TNA until reliably tolerating diet. Encouraged OOB today. PT/OT consults pending. Labs in AM.   LOS: 6 days    Wellington Hampshire , [facility] 06/17/2018, 8:33 AM Pager: [phone] Consults: [phone] Mon 7:00 am -11:30 AM Tues-Fri 7:00 am-4:30 pm Sat-Sun 7:00 am-11:30 am

## 2018-06-18 LAB — CBC
HEMATOCRIT: 28 % — AB (ref 36.0–46.0)
HEMOGLOBIN: 9.3 g/dL — AB (ref 12.0–15.0)
MCH: 30.2 pg (ref 26.0–34.0)
MCHC: 33.2 g/dL (ref 30.0–36.0)
MCV: 90.9 fL (ref 78.0–100.0)
Platelets: 243 10*3/uL (ref 150–400)
RBC: 3.08 MIL/uL — ABNORMAL LOW (ref 3.87–5.11)
RDW: 14 % (ref 11.5–15.5)
WBC: 11.1 10*3/uL — ABNORMAL HIGH (ref 4.0–10.5)

## 2018-06-18 LAB — COMPREHENSIVE METABOLIC PANEL
ALK PHOS: 61 U/L (ref 38–126)
ALT: 33 U/L (ref 0–44)
ANION GAP: 5 (ref 5–15)
AST: 45 U/L — AB (ref 15–41)
Albumin: 2 g/dL — ABNORMAL LOW (ref 3.5–5.0)
BILIRUBIN TOTAL: 0.2 mg/dL — AB (ref 0.3–1.2)
BUN: 25 mg/dL — AB (ref 8–23)
CALCIUM: 8.7 mg/dL — AB (ref 8.9–10.3)
CO2: 29 mmol/L (ref 22–32)
CREATININE: 0.92 mg/dL (ref 0.44–1.00)
Chloride: 109 mmol/L (ref 98–111)
GFR calc Af Amer: >60 mL/min (ref >60)
GFR, EST NON AFRICAN AMERICAN: 58 mL/min — AB (ref >60)
GLUCOSE: 133 mg/dL — AB (ref 70–99)
Potassium: 3.8 mmol/L (ref 3.5–5.1)
Sodium: 143 mmol/L (ref 135–145)
Total Protein: 4.8 g/dL — ABNORMAL LOW (ref 6.5–8.1)

## 2018-06-18 LAB — GLUCOSE, CAPILLARY
GLUCOSE-CAPILLARY: 119 mg/dL — AB (ref 70–99)
Glucose-Capillary: 147 mg/dL — ABNORMAL HIGH (ref 70–99)
Glucose-Capillary: 115 mg/dL — ABNORMAL HIGH (ref 70–99)

## 2018-06-18 LAB — MAGNESIUM: Magnesium: 2.2 mg/dL (ref 1.7–2.4)

## 2018-06-18 LAB — PHOSPHORUS: PHOSPHORUS: 2.6 mg/dL (ref 2.5–4.6)

## 2018-06-18 MED ORDER — FAT EMULSION PLANT BASED 20 % IV EMUL
240.0000 mL | INTRAVENOUS | Status: AC
  Administered 2018-06-18: 240 mL via INTRAVENOUS
  Filled 2018-06-18: qty 250

## 2018-06-18 MED ORDER — HYDROMORPHONE HCL 1 MG/ML IJ SOLN
0.5000 mg | INTRAMUSCULAR | Status: DC | PRN
  Administered 2018-06-18: 1 mg via INTRAVENOUS
  Filled 2018-06-18: qty 1

## 2018-06-18 MED ORDER — TRACE MINERALS CR-CU-MN-SE-ZN 10-1000-500-60 MCG/ML IV SOLN
INTRAVENOUS | Status: AC
  Administered 2018-06-18: 18:00:00 via INTRAVENOUS
  Filled 2018-06-18: qty 1800

## 2018-06-18 MED ORDER — POTASSIUM PHOSPHATES 15 MMOLE/5ML IV SOLN
10.0000 mmol | Freq: Once | INTRAVENOUS | Status: AC
  Administered 2018-06-18: 10 mmol via INTRAVENOUS
  Filled 2018-06-18: qty 3.33

## 2018-06-18 NOTE — Consult Note (Signed)
[place] Nurse ostomy follow up Stoma type/location: LLQ colostomy Stomal assessment/size: 1 3/4" pink Peristomal assessment: not assessed Treatment options for stomal/peristomal skin: none Output scant brown effluent present in pouch.  NG tube in place Ostomy pouching: 1pc. Convex  Education provided: Daughter at bedside.  Reviewed emptying pouch Enrolled patient in [place] Start Discharge program: yes [place] team will follow Domenic Moras RN BSN [facility] Pager [phone]

## 2018-06-18 NOTE — Evaluation (Signed)
Physical Therapy Evaluation Patient Details Name: Kristin [name] MRN: [id number] DOB: 12/05/1940 Today's Date: 06/18/2018   History of Present Illness  77 yo female admitted with Abd pain, N/V. S/P Hartmann's procedure/colostomy 06/16/18. Hx of CKD, CVA, breast and lung ca.   Clinical Impression  On eval, pt required Min assist for mobility. She walked ~100 feet with a RW. Mild-moderate pain with activity. Pt presents with general weakness, decreased activity tolerance, and impaired gait and balance. Will follow and progress activity as tolerated. Recommend HHPT f/u at discharge.     Follow Up Recommendations Home health PT;Supervision/Assistance - 24 hour    Equipment Recommendations  None recommended by PT    Recommendations for Other Services       Precautions / Restrictions Precautions Precautions: Fall Precaution Comments: abd surgery; NG tube; L colostomy; R jp drain Restrictions Weight Bearing Restrictions: No      Mobility  Bed Mobility Overal bed mobility: Needs Assistance Bed Mobility: Supine to Sit     Supine to sit: Min guard;HOB elevated     General bed mobility comments: HOB maximally elevated. Pt partially rolled towards R side and used bedrail to get to EOB. Increased time.   Transfers Overall transfer level: Needs assistance Equipment used: Rolling walker (2 wheeled) Transfers: Sit to/from Stand Sit to Stand: Min assist         General transfer comment: Assist to rise, stabilize, control descent. VCs safety, hand placement. Increased time.   Ambulation/Gait Min Assist Gait Distance (Feet): 100 Feet Assistive device: Rolling walker (2 wheeled) Gait Pattern/deviations: Step-through pattern;Decreased stride length     General Gait Details: Intermittent assist to steady and manevuer with RW. Slow gait speed. Pt tolerated distance well.   Stairs            Wheelchair Mobility    Modified Rankin (Stroke Patients Only)       Balance  Overall balance assessment: Needs assistance         Standing balance support: Bilateral upper extremity supported Standing balance-Leahy Scale: Poor Standing balance comment: requring RW currently                             Pertinent Vitals/Pain Pain Assessment: Faces Faces Pain Scale: Hurts little more Pain Location: abdomen Pain Descriptors / Indicators: Sore;Operative site guarding Pain Intervention(s): Monitored during session;Repositioned    Home Living Family/patient expects to be discharged to:: Private residence Living Arrangements: Spouse/significant other Available Help at Discharge: Family Type of Home: House Home Access: Level entry Entrance Stairs-Rails: None   Home Layout: Two level Home Equipment: Environmental consultant - 2 wheels;Bedside commode Additional Comments: daughter lives next door and will help for first week     Prior Function Level of Independence: Independent               Hand Dominance        Extremity/Trunk Assessment   Upper Extremity Assessment Upper Extremity Assessment: Defer to OT evaluation    Lower Extremity Assessment Lower Extremity Assessment: Generalized weakness    Cervical / Trunk Assessment Cervical / Trunk Assessment: Normal  Communication   Communication: No difficulties  Cognition Arousal/Alertness: Awake/alert Behavior During Therapy: WFL for tasks assessed/performed Overall Cognitive Status: Within Functional Limits for tasks assessed  General Comments      Exercises     Assessment/Plan    PT Assessment Patient needs continued PT services  PT Problem List Decreased balance;Decreased strength;Decreased activity tolerance;Decreased mobility;Pain;Decreased knowledge of use of DME       PT Treatment Interventions DME instruction;Gait training;Functional mobility training;Therapeutic activities;Balance training;Patient/family  education;Therapeutic exercise    PT Goals (Current goals can be found in the Care Plan section)  Acute Rehab PT Goals Patient Stated Goal: return to independence PT Goal Formulation: With patient Time For Goal Achievement: 07/02/18 Potential to Achieve Goals: Good    Frequency Min 3X/week   Barriers to discharge        Co-evaluation               AM-PAC PT "6 Clicks" Daily Activity  Outcome Measure Difficulty turning over in bed (including adjusting bedclothes, sheets and blankets)?: A Lot Difficulty moving from lying on back to sitting on the side of the bed? : Unable Difficulty sitting down on and standing up from a chair with arms (e.g., wheelchair, bedside commode, etc,.)?: Unable Help needed moving to and from a bed to chair (including a wheelchair)?: A Little Help needed walking in hospital room?: A Little Help needed climbing 3-5 steps with a railing? : A Lot 6 Click Score: 12    End of Session   Activity Tolerance: Patient tolerated treatment well Patient left: in chair;with call bell/phone within reach;with family/visitor present   PT Visit Diagnosis: Unsteadiness on feet (R26.81);Difficulty in walking, not elsewhere classified (R26.2);Muscle weakness (generalized) (M62.81)    Time: 5188-4166 PT Time Calculation (min) (ACUTE ONLY): 24 min   Charges:   PT Evaluation $PT Eval Moderate Complexity: 1 Mod PT Treatments $Gait Training: 8-22 mins          Weston Anna, MPT Pager: [phone]

## 2018-06-18 NOTE — Progress Notes (Addendum)
[facility] Progress Note  2 Days Post-Op  Subjective: CC-  Overall doing well. Sore but pain well controlled. She does report some nausea this morning when she got OOB to [facility], improved with zofran.  NG tube with about 500cc/24 hours. No flatus or stool from ostomy. Spent about 45 minutes in the chair yesterday.  Objective: Vital signs in last 24 hours: Temp:  [98.2 F (36.8 C)-98.7 F (37.1 C)] 98.3 F (36.8 C) (08/29 0527) Pulse Rate:  [85-103] 94 (08/29 0527) Resp:  [16-18] 16 (08/29 0527) BP: (115-122)/(56-61) 115/60 (08/29 0527) SpO2:  [100 %] 100 % (08/29 0527) Weight:  [68.8 kg] 68.8 kg (08/29 0527) Last BM Date: 06/16/18  Intake/Output from previous day: 08/28 0701 - 08/29 0700 In: 1899.6 [I.V.:1475.6; NG/GT:250; IV Piggyback:104] Out: 2600 [Urine:1900; Emesis/NG output:500; Drains:70; Stool:130] Intake/Output this shift: No intake/output data recorded.  PE: Gen:  Alert, NAD, pleasant HEENT: EOM's intact, pupils equal and round Card:  RRR Pulm:  CTAB, no W/R/R, effort normal Abd: Soft, ND, appropriately tender, +BS, lap incisions with cdi dressings, ostomy pink with trace serosanguinous fluid in bag, JP drain SS Psych: A&Ox3  Skin: no rashes noted, warm and dry  Lab Results:  Recent Labs    06/17/18 0327 06/18/18 0359  WBC 14.7* 11.1*  HGB 9.4* 9.3*  HCT 28.6* 28.0*  PLT 286 243   BMET Recent Labs    06/17/18 0327 06/18/18 0359  NA 145 143  K 3.7 3.8  CL 113* 109  CO2 25 29  GLUCOSE 177* 133*  BUN 17 25*  CREATININE 0.95 0.92  CALCIUM 8.7* 8.7*   PT/INR No results for input(s): LABPROT, INR in the last 72 hours. CMP     Component Value Date/Time   NA 143 06/18/2018 0359   NA 138 09/26/2017 0942   K 3.8 06/18/2018 0359   K 5.2 No visable hemolysis (H) 09/26/2017 0942   CL 109 06/18/2018 0359   CL 107 04/05/2013 0754   CO2 29 06/18/2018 0359   CO2 26 09/26/2017 0942   GLUCOSE 133 (H) 06/18/2018 0359   GLUCOSE 93  09/26/2017 0942   GLUCOSE 101 (H) 04/05/2013 0754   BUN 25 (H) 06/18/2018 0359   BUN 16.0 09/26/2017 0942   CREATININE 0.92 06/18/2018 0359   CREATININE 1.27 (H) 05/04/2018 1121   CREATININE 1.2 (H) 09/26/2017 0942   CALCIUM 8.7 (L) 06/18/2018 0359   CALCIUM 9.9 09/26/2017 0942   PROT 4.8 (L) 06/18/2018 0359   PROT 6.6 09/26/2017 0942   ALBUMIN 2.0 (L) 06/18/2018 0359   ALBUMIN 3.6 09/26/2017 0942   AST 45 (H) 06/18/2018 0359   AST 17 05/04/2018 1121   AST 18 09/26/2017 0942   ALT 33 06/18/2018 0359   ALT 15 05/04/2018 1121   ALT 13 09/26/2017 0942   ALKPHOS 61 06/18/2018 0359   ALKPHOS 68 09/26/2017 0942   BILITOT 0.2 (L) 06/18/2018 0359   BILITOT 0.3 05/04/2018 1121   BILITOT 0.31 09/26/2017 0942   GFRNONAA 58 (L) 06/18/2018 0359   GFRNONAA 39 (L) 05/04/2018 1121   GFRAA >60 06/18/2018 0359   GFRAA 45 (L) 05/04/2018 1121   Lipase     Component Value Date/Time   LIPASE 55 (H) 06/11/2018 1856       Studies/Results: No results found.  Anti-infectives: Anti-infectives (From admission, onward)   Start     Dose/Rate Route Frequency Ordered Stop   06/17/18 0800  piperacillin-tazobactam (ZOSYN) IVPB 3.375 g     3.375  g 12.5 mL/hr over 240 Minutes Intravenous Every 8 hours 06/17/18 0726     06/16/18 2200  cefoTEtan (CEFOTAN) 2 g in sodium chloride 0.9 % 100 mL IVPB     2 g 200 mL/hr over 30 Minutes Intravenous Every 12 hours 06/16/18 1339 06/17/18 0017   06/16/18 1206  clindamycin (CLEOCIN) 900 mg, gentamicin (GARAMYCIN) 240 mg in sodium chloride 0.9 % 1,000 mL for intraperitoneal lavage  Status:  Discontinued       As needed 06/16/18 1206 06/16/18 1235   06/16/18 0600  cefoTEtan (CEFOTAN) 2 g in sodium chloride 0.9 % 100 mL IVPB     2 g 200 mL/hr over 30 Minutes Intravenous On call to O.R. 06/15/18 1301 06/16/18 1027   06/16/18 0600  clindamycin (CLEOCIN) 900 mg, gentamicin (GARAMYCIN) 240 mg in sodium chloride 0.9 % 1,000 mL for intraperitoneal lavage  Status:   Discontinued      Intraperitoneal To Surgery 06/15/18 1301 06/16/18 1333   06/12/18 1700  metroNIDAZOLE (FLAGYL) IVPB 500 mg  Status:  Discontinued     500 mg 100 mL/hr over 60 Minutes Intravenous Every 8 hours 06/12/18 1604 06/17/18 0725   06/11/18 2245  cefTRIAXone (ROCEPHIN) 1 g in sodium chloride 0.9 % 100 mL IVPB  Status:  Discontinued     1 g 200 mL/hr over 30 Minutes Intravenous Daily at bedtime 06/11/18 2204 06/15/18 1030       Assessment/Plan H/o colon/lung/breast cancer - currently on chemo, followed by Dr. 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 Continue NPO/NGT and TNA until able to advance diet. Continue mobilizing. PT/OT consults pending. Labs in AM.   LOS: 7 days    Wellington Hampshire , [facility] 06/18/2018, 7:52 AM Pager: [phone] Consults: [phone] Mon 7:00 am -11:30 AM Tues-Fri 7:00 am-4:30 pm Sat-Sun 7:00 am-11:30 am

## 2018-06-18 NOTE — Care Management Important Message (Signed)
Important Message  Patient Details  Name: Kristin [name] MRN: [id number] Date of Birth: 06-18-1941   Medicare Important Message Given:  Yes    Kerin Salen 06/18/2018, 10:27 AMImportant Message  Patient Details  Name: Kristin [name] MRN: [id number] Date of Birth: [birth year]   Medicare Important Message Given:  Yes    Kerin Salen 06/18/2018, 10:27 AM

## 2018-06-18 NOTE — Progress Notes (Signed)
PHARMACY - ADULT TOTAL PARENTERAL NUTRITION CONSULT NOTE   Pharmacy Consult for TPN Indication: Bowel obstruction  Patient Measurements: Height: 5' 6.5" (168.9 cm) Weight: 151 lb 10.8 oz (68.8 kg) IBW/kg (Calculated) : 60.45 TPN AdjBW (KG): 65.6 Body mass index is 24.11 kg/m. 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  Abd CT on 8/22 showed "short segmental thickening in the rectosigmoid causing large and small-bowel obstruction."  She had sigmoidoscopy on 8/23 that showed stenosis in the sigmoid colon with decompression tube placed.  TPN started on 8/26 for bowel obstruction.  Significant events:  - 8/27: low anterior hartmann resection, end colostomy, lysis of adhesions  Today:   Glucose (goal <150): within goal range  Electrolytes: K, Mag and CorrCa wnl; phos 2.6   Renal: scr ok  LFTs: AST slightly elevated at 45, Tbili low  TGs: 99 (8/27)  Prealbumin: 6.5 (8/27)  NUTRITIONAL GOALS                                                                                             RD recs (8/26): Kcal:  1800-2000 Protein:  80-90g  Clinimix E 5/15 at a goal rate of 75 ml/hr + 20% fat emulsion at 46ml/hr for 12hrs to provide: 90 g/day protein, 1758 Kcal/day.  PLAN                                                                                                                          Now:  - potassium phosphate 10 mmol IV x1  At 1800 today:  Continue  Clinimix E 5/15 at goal  rate 75 ml/hr.  20% fat emulsion at 20 ml/hr x12 hrs.  Add pepcid 40 mg to TPN bag  Plan to advance as tolerated to the goal rate.  TPN to contain standard multivitamins and trace elements.  Continue NS at [place] SSI q6h  TPN lab panels on Mondays & Thursdays.  F/u daily.  Allena Pietila P 06/18/2018,7:33 AM

## 2018-06-18 NOTE — Evaluation (Signed)
Occupational Therapy Evaluation Patient Details Name: Kristin [name] MRN: [id number] DOB: 08-20-1941 Today's Date: 06/18/2018    History of Present Illness pt was admitted for abd pain, N/V, urinary urgency and frequency.  s/p colostomy.  PMH;  CKD, occipital CVA, breast and lung CA, and colectomy   Clinical Impression   This 77 year old female was admitted for the above.  At baseline, she is independent with all adls.  She currently needs min A for mobility and up to max A for LB adls. Will follow in acute setting with supervision to min guard assist for bathroom transfers and to reinforce energy conservation     Follow Up Recommendations  Supervision/Assistance - 24 hour;Home health OT    Equipment Recommendations  None recommended by OT    Recommendations for Other Services       Precautions / Restrictions Precautions Precautions: Fall Restrictions Weight Bearing Restrictions: No      Mobility Bed Mobility Overal bed mobility: Needs Assistance Bed Mobility: Sit to Sidelying         Sit to sidelying: Min assist General bed mobility comments: for legs  Transfers Overall transfer level: Needs assistance Equipment used: Rolling walker (2 wheeled) Transfers: Sit to/from Stand Sit to Stand: Min assist         General transfer comment: assist to rise and stabilize    Balance                                           ADL either performed or assessed with clinical judgement   ADL Overall ADL's : Needs assistance/impaired Eating/Feeding: NPO Eating/Feeding Details (indicate cue type and reason): should be independent when allowed to eat Grooming: Set up   Upper Body Bathing: Min guard(lines)   Lower Body Bathing: Moderate assistance;Sit to/from stand   Upper Body Dressing : Minimal assistance(lines)   Lower Body Dressing: Maximal assistance;Sit to/from stand   Toilet Transfer: Minimal assistance;BSC;RW;Ambulation   Toileting- Clothing  Manipulation and Hygiene: Moderate assistance;Sit to/from stand         General ADL Comments: Pt took a short walk in hall due to space constraints in room, with min A.  Started education on energy conservation.  Pt had sat up > 75 minutes; returned to bed for a rest and encouraged her to get up again.  PT is also planning to see her.  Got geomat cushion for chair for increased comfort.  Family present and very helpful Husband will assist as needed for adls     Vision         Perception     Praxis      Pertinent Vitals/Pain Pain Assessment: Faces Faces Pain Scale: Hurts little more Pain Location: abdomen Pain Descriptors / Indicators: Sore Pain Intervention(s): Limited activity within patient's tolerance;Monitored during session;Repositioned     Hand Dominance     Extremity/Trunk Assessment Upper Extremity Assessment Upper Extremity Assessment: Generalized weakness           Communication Communication Communication: No difficulties   Cognition Arousal/Alertness: Awake/alert Behavior During Therapy: WFL for tasks assessed/performed Overall Cognitive Status: Within Functional Limits for tasks assessed                                     General Comments       Exercises  Shoulder Instructions      Home Living Family/patient expects to be discharged to:: Private residence Living Arrangements: Spouse/significant other Available Help at Discharge: Family Type of Home: House Home Access: Stairs to enter     Home Layout: Two level     Bathroom Shower/Tub: Occupational psychologist: [place]: Bedside commode;Walker - 2 wheels;Shower seat   Additional Comments: daughter lives next door and will help for first week   One step into kitchen from [employer]      Prior Functioning/Environment Level of Independence: Independent        Comments: no device        OT Problem List: Decreased strength;Decreased  activity tolerance;Decreased knowledge of use of DME or AE;Pain      OT Treatment/Interventions: Self-care/ADL training;Energy conservation;DME and/or AE instruction;Patient/family education;Balance training    OT Goals(Current goals can be found in the care plan section) Acute Rehab OT Goals Patient Stated Goal: return to independence OT Goal Formulation: With patient Time For Goal Achievement: 07/02/18 Potential to Achieve Goals: Good ADL Goals Pt Will Transfer to Toilet: with supervision;bedside commode;ambulating Pt Will Perform Toileting - Clothing Manipulation and hygiene: with supervision;sit to/from stand Pt Will Perform Tub/Shower Transfer: Shower transfer;with min guard assist;ambulating;shower seat;3 in 1 Additional ADL Goal #1: pt will initiate at least one rest break during adls for energy conservation  OT Frequency: Min 2X/week   Barriers to D/C:            Co-evaluation              AM-PAC PT "6 Clicks" Daily Activity     Outcome Measure Help from another person eating meals?: None Help from another person taking care of personal grooming?: A Little Help from another person toileting, which includes using toliet, bedpan, or urinal?: A Lot Help from another person bathing (including washing, rinsing, drying)?: A Lot Help from another person to put on and taking off regular upper body clothing?: A Little Help from another person to put on and taking off regular lower body clothing?: A Lot 6 Click Score: 16   End of Session    Activity Tolerance: Patient tolerated treatment well Patient left: in bed;with call bell/phone within reach;with family/visitor present  OT Visit Diagnosis: Muscle weakness (generalized) (M62.81)                Time: 2951-8841 OT Time Calculation (min): 36 min Charges:  OT General Charges $OT Visit: 1 Visit OT Evaluation $OT Eval Low Complexity: 1 Low OT Treatments $Self Care/Home Management : 8-22 mins  Lesle Chris,  OTR/L [phone] 06/18/2018  Kristin [name] 06/18/2018, 1:05 PM

## 2018-06-19 ENCOUNTER — Ambulatory Visit (HOSPITAL_COMMUNITY): Payer: Medicare Other

## 2018-06-19 LAB — BASIC METABOLIC PANEL
Anion gap: 6 (ref 5–15)
BUN: 28 mg/dL — ABNORMAL HIGH (ref 8–23)
CO2: 29 mmol/L (ref 22–32)
CREATININE: 0.93 mg/dL (ref 0.44–1.00)
Calcium: 8.5 mg/dL — ABNORMAL LOW (ref 8.9–10.3)
Chloride: 104 mmol/L (ref 98–111)
GFR, EST NON AFRICAN AMERICAN: 57 mL/min — AB (ref >60)
Glucose, Bld: 147 mg/dL — ABNORMAL HIGH (ref 70–99)
Potassium: 4 mmol/L (ref 3.5–5.1)
Sodium: 139 mmol/L (ref 135–145)

## 2018-06-19 LAB — CBC
HCT: 26.8 % — ABNORMAL LOW (ref 36.0–46.0)
Hemoglobin: 8.8 g/dL — ABNORMAL LOW (ref 12.0–15.0)
MCH: 29.8 pg (ref 26.0–34.0)
MCHC: 32.8 g/dL (ref 30.0–36.0)
MCV: 90.8 fL (ref 78.0–100.0)
PLATELETS: 220 10*3/uL (ref 150–400)
RBC: 2.95 MIL/uL — ABNORMAL LOW (ref 3.87–5.11)
RDW: 14.1 % (ref 11.5–15.5)
WBC: 9.5 10*3/uL (ref 4.0–10.5)

## 2018-06-19 LAB — GLUCOSE, CAPILLARY
Glucose-Capillary: 136 mg/dL — ABNORMAL HIGH (ref 70–99)
Glucose-Capillary: 123 mg/dL — ABNORMAL HIGH (ref 70–99)
Glucose-Capillary: 116 mg/dL — ABNORMAL HIGH (ref 70–99)

## 2018-06-19 LAB — PHOSPHORUS: Phosphorus: 2.7 mg/dL (ref 2.5–4.6)

## 2018-06-19 MED ORDER — FAT EMULSION PLANT BASED 20 % IV EMUL
240.0000 mL | INTRAVENOUS | Status: AC
  Administered 2018-06-19: 240 mL via INTRAVENOUS
  Filled 2018-06-19: qty 250

## 2018-06-19 MED ORDER — TRACE MINERALS CR-CU-MN-SE-ZN 10-1000-500-60 MCG/ML IV SOLN
INTRAVENOUS | Status: AC
  Administered 2018-06-19: 18:00:00 via INTRAVENOUS
  Filled 2018-06-19: qty 1800

## 2018-06-19 NOTE — Consult Note (Signed)
[place] Nurse ostomy follow up Stoma type/location: LLQ colostomy Stomal assessment/size: 1 3/4" pink patent and producing liquid green stool.  Peristomal assessment: intact Treatment options for stomal/peristomal skin: convex pouch Output liquid green effluent Ostomy pouching: 1pc.convex Education provided: Pouch change performed by daughter.  ALso present is son and sister. Appropriate questions asked and answered regarding frequency of pouch change, showering and activity level as tolerated.  Supplies at bedside and additional written materials provided.  Enrolled patient in [place] Start Discharge program: Yes [place] team will follow. Domenic Moras RN MSN [place] Pager [phone]

## 2018-06-19 NOTE — Progress Notes (Signed)
Occupational Therapy Treatment Patient Details Name: Fiora Weill MRN: [id number] DOB: 1941-04-06 Today's Date: 06/19/2018    History of present illness 77 yo female admitted with Abd pain, N/V. S/P Hartmann's procedure/colostomy 06/16/18. Hx of CKD, CVA, breast and lung ca.    OT comments  Pt fatiqued and not having a good day, but very motivated to working with therapy.  Was sitting up in chair x 2 hours when I arrived.  Performed toileting, therapist demonstrated shower transfer and pt requested to walk in hall prior to returning to bed. Daughter present  Follow Up Recommendations  Supervision/Assistance - 24 hour;Home health OT    Equipment Recommendations  None recommended by OT    Recommendations for Other Services      Precautions / Restrictions Precautions Precautions: Fall Precaution Comments: abd surgery; NG tube; L colostomy; R jp drain Restrictions Weight Bearing Restrictions: No       Mobility Bed Mobility               General bed mobility comments: sit > sidelying:  min A for legs and cues for technique  Transfers   Equipment used: Rolling walker (2 wheeled)   Sit to Stand: Min assist         General transfer comment: light assist to stand from recliner and 3:1 commode    Balance                                           ADL either performed or assessed with clinical judgement   ADL                       Lower Body Dressing: Maximal assistance;Sit to/from stand   Toilet Transfer: Minimal assistance;Ambulation;BSC;RW   Toileting- Clothing Manipulation and Hygiene: Moderate assistance;Sit to/from stand         General ADL Comments: daughter present and assisted with hygiene. therapist assisted with new underwear.  Demonstrated stepping into shower using RW for support, if needed. Talked about shower chair placement.  Pt wanted to walk in hall prior to returning to bed:  min guard, assist for lines.  Pt using  incentive spirometer correctly.  Very motivated     Technical brewer Arousal/Alertness: Awake/alert Behavior During Therapy: WFL for tasks assessed/performed Overall Cognitive Status: Within Functional Limits for tasks assessed                                          Exercises     Shoulder Instructions       General Comments      Pertinent Vitals/ Pain       Faces Pain Scale: Hurts a little bit Pain Location: abdomen Pain Descriptors / Indicators: Sore;Discomfort Pain Intervention(s): Limited activity within patient's tolerance;Monitored during session;Repositioned  Home Living                                          Prior Functioning/Environment              Frequency  Min 2X/week  Progress Toward Goals  OT Goals(current goals can now be found in the care plan section)  Progress towards OT goals: Progressing toward goals     Plan      Co-evaluation                 AM-PAC PT "6 Clicks" Daily Activity     Outcome Measure   Help from another person eating meals?: None Help from another person taking care of personal grooming?: A Little Help from another person toileting, which includes using toliet, bedpan, or urinal?: A Lot Help from another person bathing (including washing, rinsing, drying)?: A Lot Help from another person to put on and taking off regular upper body clothing?: A Little Help from another person to put on and taking off regular lower body clothing?: A Lot 6 Click Score: 16    End of Session    OT Visit Diagnosis: Muscle weakness (generalized) (M62.81)   Activity Tolerance Patient tolerated treatment well   Patient Left in bed;with call bell/phone within reach;with family/visitor present;with bed alarm set   Nurse Communication          Time: 6553-7482 OT Time Calculation (min): 25 min  Charges: OT General Charges $OT Visit: 1  Visit OT Treatments $Self Care/Home Management : 8-22 mins $Therapeutic Activity: 8-22 mins  Lesle Chris, OTR/L [phone] 06/19/2018   Dominik Lauricella 06/19/2018, 2:50 PM

## 2018-06-19 NOTE — Progress Notes (Signed)
PHARMACY - ADULT TOTAL PARENTERAL NUTRITION CONSULT NOTE   Pharmacy Consult for TPN Indication: Bowel obstruction  Patient Measurements: Height: 5' 6.5" (168.9 cm) Weight: 154 lb 5.2 oz (70 kg) IBW/kg (Calculated) : 60.45 TPN AdjBW (KG): 65.6 Body mass index is 24.54 kg/m. 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  Abd CT on 8/22 showed "short segmental thickening in the rectosigmoid causing large and small-bowel obstruction."  She had sigmoidoscopy on 8/23 that showed stenosis in the sigmoid colon with decompression tube placed.  TPN started on 8/26 for bowel obstruction.  Significant events:  - 8/27: low anterior hartmann resection, end colostomy, lysis of adhesions  Today:   Glucose (goal <150): within goal range  Electrolytes: K, Mag, phos and CorrCa wnl  Renal: scr ok  LFTs: AST slightly elevated at 45 on 8/29, Tbili low  TGs: 99 (8/27)  Prealbumin: 6.5 (8/27)  NUTRITIONAL GOALS                                                                                             RD recs (8/26): Kcal:  1800-2000 Protein:  80-90g  Clinimix E 5/15 at a goal rate of 75 ml/hr + 20% fat emulsion at 84ml/hr for 12hrs to provide: 90 g/day protein, 1758 Kcal/day.  PLAN                                                                                                                          At 1800 today:  Continue Clinimix E 5/15 at goal rate 75 ml/hr.  20% fat emulsion at 20 ml/hr x12  hrs.  Add pepcid 40 mg to TPN  bag  TPN to contain standard multivitamins and trace elements.  Continue NS at [place] SSI q6h  TPN lab panels on Mondays & Thursdays.  F/u daily.  Kataleena Holsapple P 06/19/2018,7:01 AM

## 2018-06-19 NOTE — Care Management (Signed)
This CM met with pt, husband and daughter to go over home care needs. Choice offered for home health services and [facility] chosen. [facility] contacted for referral and MD orders received. Pt has RW and 3in1 at home currently. Marney Doctor RN,BSN [phone]

## 2018-06-19 NOTE — Progress Notes (Addendum)
[facility] Progress Note  3 Days Post-Op  Subjective: CC-  Sitting up in chair. Daughter at bedside.  States that she coughed a lot last night and had a low grade temp 99.5. Improving this morning. Abdomen sore. Denies n/v. No flatus or stool from ostomy.  Worked well with therapies, recommend home health PT/OT.  Objective: Vital signs in last 24 hours: Temp:  [97.6 F (36.4 C)-99.5 F (37.5 C)] 98.4 F (36.9 C) (08/30 0547) Pulse Rate:  [88-97] 88 (08/30 0547) Resp:  [16-19] 16 (08/30 0547) BP: (120-132)/(50-66) 120/50 (08/30 0547) SpO2:  [92 %-99 %] 99 % (08/30 0547) Weight:  [70 kg] 70 kg (08/30 0547) Last BM Date: 06/19/18  Intake/Output from previous day: 08/29 0701 - 08/30 0700 In: 2092.7 [P.O.:40; I.V.:1762.9; IV Piggyback:289.8] Out: 1825 [Urine:1275; Emesis/NG output:425; Drains:95; Stool:30] Intake/Output this shift: No intake/output data recorded.  PE: Gen: Alert, NAD, pleasant HEENT: EOM's intact, pupils equal and round Card: RRR Pulm: slightly decreased breath sounds bilateral bases, no W/R/R, effort normal Abd: Soft,ND, appropriately tender, +BS,lap incisions cdi, ostomy pink with trace serosanguinous fluid in bag, JP drain SS Psych: A&Ox3  Skin: no rashes noted, warm and dry  Lab Results:  Recent Labs    06/18/18 0359 06/19/18 0456  WBC 11.1* 9.5  HGB 9.3* 8.8*  HCT 28.0* 26.8*  PLT 243 220   BMET Recent Labs    06/18/18 0359 06/19/18 0456  NA 143 139  K 3.8 4.0  CL 109 104  CO2 29 29  GLUCOSE 133* 147*  BUN 25* 28*  CREATININE 0.92 0.93  CALCIUM 8.7* 8.5*   PT/INR No results for input(s): LABPROT, INR in the last 72 hours. CMP     Component Value Date/Time   NA 139 06/19/2018 0456   NA 138 09/26/2017 0942   K 4.0 06/19/2018 0456   K 5.2 No visable hemolysis (H) 09/26/2017 0942   CL 104 06/19/2018 0456   CL 107 04/05/2013 0754   CO2 29 06/19/2018 0456   CO2 26 09/26/2017 0942   GLUCOSE 147 (H) 06/19/2018  0456   GLUCOSE 93 09/26/2017 0942   GLUCOSE 101 (H) 04/05/2013 0754   BUN 28 (H) 06/19/2018 0456   BUN 16.0 09/26/2017 0942   CREATININE 0.93 06/19/2018 0456   CREATININE 1.27 (H) 05/04/2018 1121   CREATININE 1.2 (H) 09/26/2017 0942   CALCIUM 8.5 (L) 06/19/2018 0456   CALCIUM 9.9 09/26/2017 0942   PROT 4.8 (L) 06/18/2018 0359   PROT 6.6 09/26/2017 0942   ALBUMIN 2.0 (L) 06/18/2018 0359   ALBUMIN 3.6 09/26/2017 0942   AST 45 (H) 06/18/2018 0359   AST 17 05/04/2018 1121   AST 18 09/26/2017 0942   ALT 33 06/18/2018 0359   ALT 15 05/04/2018 1121   ALT 13 09/26/2017 0942   ALKPHOS 61 06/18/2018 0359   ALKPHOS 68 09/26/2017 0942   BILITOT 0.2 (L) 06/18/2018 0359   BILITOT 0.3 05/04/2018 1121   BILITOT 0.31 09/26/2017 0942   GFRNONAA 57 (L) 06/19/2018 0456   GFRNONAA 39 (L) 05/04/2018 1121   GFRAA >60 06/19/2018 0456   GFRAA 45 (L) 05/04/2018 1121   Lipase     Component Value Date/Time   LIPASE 55 (H) 06/11/2018 1856       Studies/Results: No results found.  Anti-infectives: Anti-infectives (From admission, onward)   Start     Dose/Rate Route Frequency Ordered Stop   06/17/18 0800  piperacillin-tazobactam (ZOSYN) IVPB 3.375 g     3.375 g  12.5 mL/hr over 240 Minutes Intravenous Every 8 hours 06/17/18 0726     06/16/18 2200  cefoTEtan (CEFOTAN) 2 g in sodium chloride 0.9 % 100 mL IVPB     2 g 200 mL/hr over 30 Minutes Intravenous Every 12 hours 06/16/18 1339 06/17/18 0017   06/16/18 1206  clindamycin (CLEOCIN) 900 mg, gentamicin (GARAMYCIN) 240 mg in sodium chloride 0.9 % 1,000 mL for intraperitoneal lavage  Status:  Discontinued       As needed 06/16/18 1206 06/16/18 1235   06/16/18 0600  cefoTEtan (CEFOTAN) 2 g in sodium chloride 0.9 % 100 mL IVPB     2 g 200 mL/hr over 30 Minutes Intravenous On call to O.R. 06/15/18 1301 06/16/18 1027   06/16/18 0600  clindamycin (CLEOCIN) 900 mg, gentamicin (GARAMYCIN) 240 mg in sodium chloride 0.9 % 1,000 mL for intraperitoneal  lavage  Status:  Discontinued      Intraperitoneal To Surgery 06/15/18 1301 06/16/18 1333   06/12/18 1700  metroNIDAZOLE (FLAGYL) IVPB 500 mg  Status:  Discontinued     500 mg 100 mL/hr over 60 Minutes Intravenous Every 8 hours 06/12/18 1604 06/17/18 0725   06/11/18 2245  cefTRIAXone (ROCEPHIN) 1 g in sodium chloride 0.9 % 100 mL IVPB  Status:  Discontinued     1 g 200 mL/hr over 30 Minutes Intravenous Daily at bedtime 06/11/18 2204 06/15/18 1030       Assessment/Plan H/o colon/lung/breast cancer - currently on chemo, followed by Dr. Julien Nordmann S/p robotic right hemicolectomy in May 2017 by Dr. 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 FIBROMUSCULAR HYPERTROPHY CONSISTENT WITH STRICTURE. NO EVIDENCE OF MALIGNANCY - awaiting return in bowel function - JP drain SS  ID -zosyn 8/28>>day#3, cefotan x1 8/27, flagyl 8/23>>8/28, rocephin 8/22>>8/26 FEN - NPO/NGT, TNA VTE -SCDs, SQ heparin Foley -none  Plan- Continue NPO/NGT, awaiting return in bowel function. Will plan clamping trial once passing gas or stool. Continue TNA. Continue mobilizing and therapies.  Home health PT/OT/RN ordered. IS for atelectasis.    LOS: 8 days    Wellington Hampshire , [facility] 06/19/2018, 8:31 AM Pager: [phone] Consults: [phone] Mon 7:00 am -11:30 AM Tues-Fri 7:00 am-4:30 pm Sat-Sun 7:00 am-11:30 am

## 2018-06-20 LAB — GLUCOSE, CAPILLARY
GLUCOSE-CAPILLARY: 148 mg/dL — AB (ref 70–99)
Glucose-Capillary: 124 mg/dL — ABNORMAL HIGH (ref 70–99)
Glucose-Capillary: 125 mg/dL — ABNORMAL HIGH (ref 70–99)
Glucose-Capillary: 129 mg/dL — ABNORMAL HIGH (ref 70–99)

## 2018-06-20 MED ORDER — FAT EMULSION PLANT BASED 20 % IV EMUL
240.0000 mL | INTRAVENOUS | Status: AC
  Administered 2018-06-20: 240 mL via INTRAVENOUS
  Filled 2018-06-20: qty 250

## 2018-06-20 MED ORDER — TRACE MINERALS CR-CU-MN-SE-ZN 10-1000-500-60 MCG/ML IV SOLN
INTRAVENOUS | Status: AC
  Administered 2018-06-20: 17:00:00 via INTRAVENOUS
  Filled 2018-06-20: qty 1800

## 2018-06-20 NOTE — Progress Notes (Signed)
Occupational Therapy Treatment Patient Details Name: Soraida Vickers MRN: [id number] DOB: 1941/07/13 Today's Date: 06/20/2018    History of present illness 77 yo female admitted with Abd pain, N/V. S/P Hartmann's procedure/colostomy 06/16/18. Hx of CKD, CVA, breast and lung ca.    OT comments  Goal added for BUE Exercise to maintain strength BUE for ADL activity   Follow Up Recommendations  Supervision/Assistance - 24 hour;Home health OT    Equipment Recommendations  None recommended by OT    Recommendations for Other Services      Precautions / Restrictions Precautions Precautions: Fall Precaution Comments: abd surgery; NG tube; L colostomy; R jp drain Restrictions Weight Bearing Restrictions: No              ADL either performed or assessed with clinical judgement   ADL                                         General ADL Comments: pt just back to bed but did agree to BUE exercise for strength BUE- goal added     Vision Patient Visual Report: No change from baseline            Cognition Arousal/Alertness: Awake/alert Behavior During Therapy: WFL for tasks assessed/performed Overall Cognitive Status: Within Functional Limits for tasks assessed                                          Exercises General Exercises - Upper Extremity Shoulder Flexion: AROM;20 reps;Both;Supine Elbow Extension: AROM;20 reps;Supine Wrist Flexion: AROM;20 reps;Supine           Pertinent Vitals/ Pain       Pain Assessment: No/denies pain     Prior Functioning/Environment              Frequency  Min 2X/week        Progress Toward Goals  OT Goals(current goals can now be found in the care plan section)  Progress towards OT goals: Progressing toward goals  ADL Goals Pt/caregiver will Perform Home Exercise Program: Increased strength;Both right and left upper extremity;With theraband;With written HEP provided  Plan Discharge plan  remains appropriate    Co-evaluation                 AM-PAC PT "6 Clicks" Daily Activity     Outcome Measure   Help from another person eating meals?: None Help from another person taking care of personal grooming?: A Little Help from another person toileting, which includes using toliet, bedpan, or urinal?: A Lot Help from another person bathing (including washing, rinsing, drying)?: A Lot Help from another person to put on and taking off regular upper body clothing?: A Little Help from another person to put on and taking off regular lower body clothing?: A Lot 6 Click Score: 16    End of Session    OT Visit Diagnosis: Muscle weakness (generalized) (M62.81)   Activity Tolerance Patient tolerated treatment well   Patient Left in bed;with call bell/phone within reach;with family/visitor present;with bed alarm set   Nurse Communication          Time: 1345-1400 OT Time Calculation (min): 15 min  Charges: OT General Charges $OT Visit: 1 Visit OT Treatments $Therapeutic Exercise: 8-22 mins  [place], [place]   [place],  Thereasa Parkin 06/20/2018, 2:56 PM

## 2018-06-20 NOTE — Progress Notes (Signed)
PHARMACY - ADULT TOTAL PARENTERAL NUTRITION CONSULT NOTE   Pharmacy Consult for TPN Indication: Bowel obstruction  Patient Measurements: Height: 5' 6.5" (168.9 cm) Weight: 142 lb 14.4 oz (64.8 kg) IBW/kg (Calculated) : 60.45 TPN AdjBW (KG): 65.6 Body mass index is 22.72 kg/m.  Insulin Requirements: on sensitive SSI q6h (3 units / 24 hrs) - no hx DM  Current Nutrition: TPN - has NGT - Minimal to no ostomy output yet.    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  Abd CT on 8/22 showed "short segmental thickening in the rectosigmoid causing large and small-bowel obstruction."  She had sigmoidoscopy on 8/23 that showed stenosis in the sigmoid colon with decompression tube placed.  TPN started on 8/26 for bowel obstruction.  Significant events:  - 8/27: low anterior hartmann resection, end colostomy, lysis of adhesions  Today:   Glucose (goal <150): within goal range  Electrolytes: on 8/30 Elytes (K, Mag, phos and CorrCa) were WNL  Renal: scr ok  LFTs: AST slightly elevated at 45 on 8/29, Tbili low  TGs: 99 (8/27)  Prealbumin: 6.5 (8/27)  NUTRITIONAL GOALS                                                                                             RD recs (8/26):  Kcal:  1800-2000, Protein:  80-90g  Clinimix E 5/15 at a goal rate of 75 ml/hr + 20% fat emulsion at 19ml/hr for 12hrs to provide: 90 g/day protein, 1758 Kcal/day.  PLAN                                                                                                                          At 1800 today:  Continue Clinimix  E 5/15 at goal rate 75 ml/hr.  20% fat  emulsion at 20 ml/hr x12 hrs.  Add pepcid 40 mg to TPN bag  TPN to contain standard multivitamins and trace elements.  Continue IVF per MD  Continue SSI q6h  TPN lab panels on Mondays & Thursdays.  F/u daily.   Gretta Arab PharmD, BCPS Pager [phone] 06/20/2018 10:00 AM

## 2018-06-20 NOTE — Progress Notes (Signed)
Patient ID: Kristin [name], female   DOB: 1941-05-12, 77 y.o.   MRN: [id number] [facility] Progress Note:   4 Days Post-Op  Subjective: Mental status is fairly alert Objective: Vital signs in last 24 hours: Temp:  [98.1 F (36.7 C)-98.7 F (37.1 C)] 98.7 F (37.1 C) (08/31 0545) Pulse Rate:  [89-100] 100 (08/31 0545) Resp:  [15-18] 15 (08/31 0545) BP: (113-123)/(50-63) 114/63 (08/31 0545) SpO2:  [97 %-100 %] 97 % (08/31 0545) Weight:  [64.8 kg] 64.8 kg (08/31 0545)  Intake/Output from previous day: 08/30 0701 - 08/31 0700 In: 4616.7 [P.O.:120; I.V.:4246.8; NG/GT:100; IV Piggyback:149.9] Out: 2385 [Urine:1800; Emesis/NG output:445; Drains:40; Stool:100] Intake/Output this shift: No intake/output data recorded.  Physical Exam: Work of breathing is not labored.  Minimal flatus  Lab Results:  Results for orders placed or performed during the hospital encounter of 06/11/18 (from the past 48 hour(s))  Glucose, capillary     Status: Abnormal   Collection Time: 06/18/18 11:56 AM  Result Value Ref Range   Glucose-Capillary 119 (H) 70 - 99 mg/dL  Glucose, capillary     Status: Abnormal   Collection Time: 06/18/18  5:17 PM  Result Value Ref Range   Glucose-Capillary 115 (H) 70 - 99 mg/dL  Basic metabolic panel     Status: Abnormal   Collection Time: 06/19/18  4:56 AM  Result Value Ref Range   Sodium 139 135 - 145 mmol/L   Potassium 4.0 3.5 - 5.1 mmol/L   Chloride 104 98 - 111 mmol/L   CO2 29 22 - 32 mmol/L   Glucose, Bld 147 (H) 70 - 99 mg/dL   BUN 28 (H) 8 - 23 mg/dL   Creatinine, Ser 0.93 0.44 - 1.00 mg/dL   Calcium 8.5 (L) 8.9 - 10.3 mg/dL   GFR calc non Af Amer 57 (L) >60 mL/min   GFR calc Af Amer >60 >60 mL/min    Comment: (NOTE) The eGFR has been calculated using the CKD EPI equation. This calculation has not been validated in all clinical situations. eGFR's persistently <60 mL/min signify possible Chronic Kidney Disease.    Anion gap 6 5 - 15   Comment: Performed at [facility], [place] [address] [place], [place] [address]  Phosphorus     Status: None   Collection Time: 06/19/18  4:56 AM  Result Value Ref Range   Phosphorus 2.7 2.5 - 4.6 mg/dL    Comment: Performed at [facility], [place] [address], [place], [place] [address]  CBC     Status: Abnormal   Collection Time: 06/19/18  4:56 AM  Result Value Ref Range   WBC 9.5 4.0 - 10.5 K/uL   RBC 2.95 (L) 3.87 - 5.11 MIL/uL   Hemoglobin 8.8 (L) 12.0 - 15.0 g/dL   HCT 26.8 (L) 36.0 - 46.0 %   MCV 90.8 78.0 - 100.0 fL   MCH 29.8 26.0 - 34.0 pg   MCHC 32.8 30.0 - 36.0 g/dL   RDW 14.1 11.5 - 15.5 %   Platelets 220 150 - 400 K/uL    Comment: Performed at [facility], [place] [address], [place], [place] [address]  Glucose, capillary     Status: Abnormal   Collection Time: 06/19/18  5:45 AM  Result Value Ref Range   Glucose-Capillary 136 (H) 70 - 99 mg/dL  Glucose, capillary     Status: Abnormal   Collection Time: 06/19/18 11:20 AM  Result Value Ref Range   Glucose-Capillary 123 (  H) 70 - 99 mg/dL  Glucose, capillary     Status: Abnormal   Collection Time: 06/19/18  5:24 PM  Result Value Ref Range   Glucose-Capillary 116 (H) 70 - 99 mg/dL  Glucose, capillary     Status: Abnormal   Collection Time: 06/19/18 11:57 PM  Result Value Ref Range   Glucose-Capillary 125 (H) 70 - 99 mg/dL  Glucose, capillary     Status: Abnormal   Collection Time: 06/20/18  5:49 AM  Result Value Ref Range   Glucose-Capillary 129 (H) 70 - 99 mg/dL    Radiology/Results: No results found.  Anti-infectives: Anti-infectives (From admission, onward)   Start     Dose/Rate Route Frequency Ordered Stop   06/17/18 0800  piperacillin-tazobactam (ZOSYN) IVPB 3.375 g     3.375 g 12.5 mL/hr over 240 Minutes Intravenous Every 8 hours 06/17/18 0726     06/16/18 2200  cefoTEtan (CEFOTAN) 2 g in sodium chloride 0.9 % 100 mL IVPB     2 g 200 mL/hr over 30  Minutes Intravenous Every 12 hours 06/16/18 1339 06/17/18 0017   06/16/18 1206  clindamycin (CLEOCIN) 900 mg, gentamicin (GARAMYCIN) 240 mg in sodium chloride 0.9 % 1,000 mL for intraperitoneal lavage  Status:  Discontinued       As needed 06/16/18 1206 06/16/18 1235   06/16/18 0600  cefoTEtan (CEFOTAN) 2 g in sodium chloride 0.9 % 100 mL IVPB     2 g 200 mL/hr over 30 Minutes Intravenous On call to O.R. 06/15/18 1301 06/16/18 1027   06/16/18 0600  clindamycin (CLEOCIN) 900 mg, gentamicin (GARAMYCIN) 240 mg in sodium chloride 0.9 % 1,000 mL for intraperitoneal lavage  Status:  Discontinued      Intraperitoneal To Surgery 06/15/18 1301 06/16/18 1333   06/12/18 1700  metroNIDAZOLE (FLAGYL) IVPB 500 mg  Status:  Discontinued     500 mg 100 mL/hr over 60 Minutes Intravenous Every 8 hours 06/12/18 1604 06/17/18 0725   06/11/18 2245  cefTRIAXone (ROCEPHIN) 1 g in sodium chloride 0.9 % 100 mL IVPB  Status:  Discontinued     1 g 200 mL/hr over 30 Minutes Intravenous Daily at bedtime 06/11/18 2204 06/15/18 1030      Assessment/Plan: Problem List: Patient Active Problem List   Diagnosis Date Noted  . 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 Malnutrition of moderate degree 06/15/2018  . CKD (chronic kidney disease), stage III ([place]) 06/11/2018  . Large bowel obstruction ([place]) 06/11/2018  . Acute lower UTI 06/11/2018  . Encounter for antineoplastic chemotherapy 10/28/2016  . Anemia of chronic disease 09/21/2016  . 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 S/P hip replacement, left 08/07/2016  . 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 Genetic testing 06/20/2016  . Family history of breast cancer   . Family history of colon cancer   . Family history of brain cancer   . Family history of prostate cancer   . Melanocytic nevi of trunk 03/07/2016  . Seborrheic keratoses of trunk 03/06/2016  . Status post  partial colectomy 03/06/2016  . 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 Osteoporosis 04/25/2015  . Disc disorder 05/30/2014  . Arthritis of left hip 05/30/2014  . Occipital cerebral infarction ([place]) 04/30/2013  . Hypertension 04/29/2013  . Nonspecific abnormal finding in stool contents 07/31/2011  . Esophageal reflux 07/31/2011  . Benign neoplasm of colon 07/31/2011  . Hernia, hiatal  07/31/2011  . Cancer of upper lobe of left lung ([place]) 06/13/2011  . Personal history of breast cancer 06/13/2011  . Diverticulosis of colon (without mention of hemorrhage) 06/13/2011  . Adenocarcinoma of lung ([place]) 01/01/2011  . Status of breast implant 05/20/2010  . Menopausal syndrome 07/03/2004  . Hypercholesterolemia 01/05/2002  . Absence of bladder continence 12/02/2000  . Depression 12/02/2000    NG in place;  Minimal to no ostomy output yet.   4 Days Post-Op    LOS: 9 days   Matt B. Hassell Done, MD, [facility] [phone] beeper [phone]  06/20/2018 8:44 AM

## 2018-06-21 LAB — BASIC METABOLIC PANEL
Anion gap: 8 (ref 5–15)
BUN: 27 mg/dL — ABNORMAL HIGH (ref 8–23)
CO2: 27 mmol/L (ref 22–32)
Calcium: 8.6 mg/dL — ABNORMAL LOW (ref 8.9–10.3)
Chloride: 105 mmol/L (ref 98–111)
Creatinine, Ser: 0.92 mg/dL (ref 0.44–1.00)
GFR calc Af Amer: >60 mL/min (ref >60)
GFR, EST NON AFRICAN AMERICAN: 58 mL/min — AB (ref >60)
Glucose, Bld: 136 mg/dL — ABNORMAL HIGH (ref 70–99)
POTASSIUM: 4.4 mmol/L (ref 3.5–5.1)
Sodium: 140 mmol/L (ref 135–145)

## 2018-06-21 LAB — GLUCOSE, CAPILLARY
GLUCOSE-CAPILLARY: 137 mg/dL — AB (ref 70–99)
Glucose-Capillary: 104 mg/dL — ABNORMAL HIGH (ref 70–99)
Glucose-Capillary: 125 mg/dL — ABNORMAL HIGH (ref 70–99)
Glucose-Capillary: 120 mg/dL — ABNORMAL HIGH (ref 70–99)

## 2018-06-21 MED ORDER — FAT EMULSION PLANT BASED 20 % IV EMUL
240.0000 mL | INTRAVENOUS | Status: AC
  Administered 2018-06-21: 240 mL via INTRAVENOUS
  Filled 2018-06-21: qty 250

## 2018-06-21 MED ORDER — TRACE MINERALS CR-CU-MN-SE-ZN 10-1000-500-60 MCG/ML IV SOLN
INTRAVENOUS | Status: AC
  Administered 2018-06-21: 18:00:00 via INTRAVENOUS
  Filled 2018-06-21: qty 1800

## 2018-06-21 NOTE — Progress Notes (Signed)
Pt tolerating sips and chips well. Rn will advance pt to clear liquid diet per Dr.Martins order. Pt stable at this time with no needs, resting with family at bedside. Will continue to monitor.

## 2018-06-21 NOTE — Progress Notes (Signed)
PHARMACY - ADULT TOTAL PARENTERAL NUTRITION CONSULT NOTE   Pharmacy Consult for TPN Indication: Bowel obstruction  Patient Measurements: Height: 5' 6.5" (168.9 cm) Weight: 142 lb 14.4 oz (64.8 kg) IBW/kg (Calculated) : 60.45 TPN AdjBW (KG): 65.6 Body mass index is 22.72 kg/m.  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  Abd CT on 8/22 showed "short segmental thickening in the rectosigmoid causing large and small-bowel obstruction."  She had sigmoidoscopy on 8/23 that showed stenosis in the sigmoid colon with decompression tube placed.  TPN started on 8/26 for bowel obstruction.  Significant events:  - 8/27: low anterior hartmann resection, end colostomy, lysis of adhesions - 9/1:  NGT removed, colostomy with output.  Advancing diet.  Today:   Glucose (goal <150): within goal range on minimal SSI.  Electrolytes: Elytes (Na, K, Ca) are WNL  Renal: scr ok  LFTs: AST slightly elevated at 45 on 8/29, Tbili low  TGs: 99 (8/27)  Prealbumin: 6.5 (8/27)  NUTRITIONAL GOALS                                                                                             RD recs (8/26):  Kcal:  1800-2000, Protein:  80-90g  Clinimix E 5/15 at a goal rate of 75 ml/hr + 20% fat emulsion at 52ml/hr for 12hrs to provide: 90 g/day protein, 1758 Kcal/day.  PLAN                                                                                                                          At  1800 today:  Continue Clinimix E  5/15 at goal rate 75 ml/hr.  20% fat emulsion at 20 ml/hr x12 hrs.  Add pepcid 40 mg to TPN bag  TPN to contain standard multivitamins and trace elements.  Continue IVF per MD  Continue SSI q6h  TPN lab panels on Mondays & Thursdays.  F/u plans for advancing/tolerating diet.  Recommend to discontinue TPN when enteral intake is >/= 60% of caloric needs.   Gretta Arab PharmD, BCPS Pager [phone] 06/21/2018 8:06 AM

## 2018-06-21 NOTE — Plan of Care (Signed)
Pt stable with NG tube d/c'ed this am per md order. Pt colostomy is working well with output this am. Will attempt to advance to clear liquid diet if she tolerates sips and chips this am. No changes needed to care plans.

## 2018-06-21 NOTE — Progress Notes (Signed)
Patient ID: Kristin [name], female   DOB: 02-27-1941, 77 y.o.   MRN: [id number] [facility] Progress Note:   5 Days Post-Op  Subjective: Mental status is clear. Objective: Vital signs in last 24 hours: Temp:  [98.4 F (36.9 C)-98.6 F (37 C)] 98.5 F (36.9 C) (09/01 0539) Pulse Rate:  [90-100] 98 (09/01 0539) Resp:  [14-16] 14 (09/01 0539) BP: (108-127)/(54-69) 114/69 (09/01 0539) SpO2:  [98 %-99 %] 99 % (09/01 0539)  Intake/Output from previous day: 08/31 0701 - 09/01 0700 In: 2422.7 [P.O.:240; I.V.:2032.7; IV Piggyback:150] Out: 0932 [Urine:2900; Emesis/NG output:450; Drains:75; IZTIW:580] Intake/Output this shift: No intake/output data recorded.  Physical Exam: Work of breathing is not labored.  Liquid in ostomy bag  Lab Results:  Results for orders placed or performed during the hospital encounter of 06/11/18 (from the past 48 hour(s))  Glucose, capillary     Status: Abnormal   Collection Time: 06/19/18 11:20 AM  Result Value Ref Range   Glucose-Capillary 123 (H) 70 - 99 mg/dL  Glucose, capillary     Status: Abnormal   Collection Time: 06/19/18  5:24 PM  Result Value Ref Range   Glucose-Capillary 116 (H) 70 - 99 mg/dL  Glucose, capillary     Status: Abnormal   Collection Time: 06/19/18 11:57 PM  Result Value Ref Range   Glucose-Capillary 125 (H) 70 - 99 mg/dL  Glucose, capillary     Status: Abnormal   Collection Time: 06/20/18  5:49 AM  Result Value Ref Range   Glucose-Capillary 129 (H) 70 - 99 mg/dL  Glucose, capillary     Status: Abnormal   Collection Time: 06/20/18 11:29 AM  Result Value Ref Range   Glucose-Capillary 148 (H) 70 - 99 mg/dL  Glucose, capillary     Status: Abnormal   Collection Time: 06/20/18  5:59 PM  Result Value Ref Range   Glucose-Capillary 124 (H) 70 - 99 mg/dL  Glucose, capillary     Status: Abnormal   Collection Time: 06/21/18 12:21 AM  Result Value Ref Range   Glucose-Capillary 137 (H) 70 - 99 mg/dL  Glucose, capillary      Status: Abnormal   Collection Time: 06/21/18  6:21 AM  Result Value Ref Range   Glucose-Capillary 104 (H) 70 - 99 mg/dL  Basic metabolic panel     Status: Abnormal   Collection Time: 06/21/18  6:23 AM  Result Value Ref Range   Sodium 140 135 - 145 mmol/L   Potassium 4.4 3.5 - 5.1 mmol/L   Chloride 105 98 - 111 mmol/L   CO2 27 22 - 32 mmol/L   Glucose, Bld 136 (H) 70 - 99 mg/dL   BUN 27 (H) 8 - 23 mg/dL   Creatinine, Ser 0.92 0.44 - 1.00 mg/dL   Calcium 8.6 (L) 8.9 - 10.3 mg/dL   GFR calc non Af Amer 58 (L) >60 mL/min   GFR calc Af Amer >60 >60 mL/min    Comment: (NOTE) The eGFR has been calculated using the CKD EPI equation. This calculation has not been validated in all clinical situations. eGFR's persistently <60 mL/min signify possible Chronic Kidney Disease.    Anion gap 8 5 - 15    Comment: Performed at [facility], [place] [address], [place], [place] [address]    Radiology/Results: No results found.  Anti-infectives: Anti-infectives (From admission, onward)   Start     Dose/Rate Route Frequency Ordered Stop   06/17/18 0800  piperacillin-tazobactam (ZOSYN) IVPB 3.375 g  3.375 g 12.5 mL/hr over 240 Minutes Intravenous Every 8 hours 06/17/18 0726     06/16/18 2200  cefoTEtan (CEFOTAN) 2 g in sodium chloride 0.9 % 100 mL IVPB     2 g 200 mL/hr over 30 Minutes Intravenous Every 12 hours 06/16/18 1339 06/17/18 0017   06/16/18 1206  clindamycin (CLEOCIN) 900 mg, gentamicin (GARAMYCIN) 240 mg in sodium chloride 0.9 % 1,000 mL for intraperitoneal lavage  Status:  Discontinued       As needed 06/16/18 1206 06/16/18 1235   06/16/18 0600  cefoTEtan (CEFOTAN) 2 g in sodium chloride 0.9 % 100 mL IVPB     2 g 200 mL/hr over 30 Minutes Intravenous On call to O.R. 06/15/18 1301 06/16/18 1027   06/16/18 0600  clindamycin (CLEOCIN) 900 mg, gentamicin (GARAMYCIN) 240 mg in sodium chloride 0.9 % 1,000 mL for intraperitoneal lavage  Status:  Discontinued       Intraperitoneal To Surgery 06/15/18 1301 06/16/18 1333   06/12/18 1700  metroNIDAZOLE (FLAGYL) IVPB 500 mg  Status:  Discontinued     500 mg 100 mL/hr over 60 Minutes Intravenous Every 8 hours 06/12/18 1604 06/17/18 0725   06/11/18 2245  cefTRIAXone (ROCEPHIN) 1 g in sodium chloride 0.9 % 100 mL IVPB  Status:  Discontinued     1 g 200 mL/hr over 30 Minutes Intravenous Daily at bedtime 06/11/18 2204 06/15/18 1030      Assessment/Plan: Problem List: Patient Active Problem List   Diagnosis Date Noted  . 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 Malnutrition of moderate degree 06/15/2018  . CKD (chronic kidney disease), stage III ([place]) 06/11/2018  . Large bowel obstruction ([place]) 06/11/2018  . Acute lower UTI 06/11/2018  . Encounter for antineoplastic chemotherapy 10/28/2016  . Anemia of chronic disease 09/21/2016  . 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 S/P hip replacement, left 08/07/2016  . 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 Genetic testing 06/20/2016  . Family history of breast cancer   . Family history of colon cancer   . Family history of brain cancer   . Family history of prostate cancer   . Melanocytic nevi of trunk 03/07/2016  . Seborrheic keratoses of trunk 03/06/2016  . Status post partial colectomy 03/06/2016  . 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 Osteoporosis 04/25/2015  . Disc disorder 05/30/2014  . Arthritis of left hip 05/30/2014  . Occipital cerebral infarction ([place]) 04/30/2013  . Hypertension 04/29/2013  . Nonspecific abnormal finding in stool contents 07/31/2011  . Esophageal reflux 07/31/2011  . Benign neoplasm of colon 07/31/2011  . Hernia, hiatal 07/31/2011  . Cancer of upper lobe of left lung ([place]) 06/13/2011  . Personal history of breast cancer 06/13/2011  . Diverticulosis of colon (without mention of hemorrhage)  06/13/2011  . Adenocarcinoma of lung ([place]) 01/01/2011  . Status of breast implant 05/20/2010  . Menopausal syndrome 07/03/2004  . Hypercholesterolemia 01/05/2002  . Absence of bladder continence 12/02/2000  . Depression 12/02/2000    NG discontinued and start sips and chips 5 Days Post-Op    LOS: 10 days   Matt B. Hassell Done, MD, [facility] [phone] beeper [phone]  06/21/2018 8:45 AM

## 2018-06-22 LAB — CBC
HCT: 27.3 % — ABNORMAL LOW (ref 36.0–46.0)
Hemoglobin: 8.9 g/dL — ABNORMAL LOW (ref 12.0–15.0)
MCH: 29 pg (ref 26.0–34.0)
MCHC: 32.6 g/dL (ref 30.0–36.0)
MCV: 88.9 fL (ref 78.0–100.0)
PLATELETS: 257 10*3/uL (ref 150–400)
RBC: 3.07 MIL/uL — AB (ref 3.87–5.11)
RDW: 14.3 % (ref 11.5–15.5)
WBC: 7.6 10*3/uL (ref 4.0–10.5)

## 2018-06-22 LAB — DIFFERENTIAL
BASOS ABS: 0 10*3/uL (ref 0.0–0.1)
BASOS PCT: 0 %
Eosinophils Absolute: 0.4 10*3/uL (ref 0.0–0.7)
Eosinophils Relative: 5 %
LYMPHS PCT: 17 %
Lymphs Abs: 1.3 10*3/uL (ref 0.7–4.0)
MONO ABS: 1.2 10*3/uL — AB (ref 0.1–1.0)
Monocytes Relative: 16 %
NEUTROS ABS: 4.6 10*3/uL (ref 1.7–7.7)
NEUTROS PCT: 62 %

## 2018-06-22 LAB — GLUCOSE, CAPILLARY
GLUCOSE-CAPILLARY: 115 mg/dL — AB (ref 70–99)
GLUCOSE-CAPILLARY: 110 mg/dL — AB (ref 70–99)
Glucose-Capillary: 102 mg/dL — ABNORMAL HIGH (ref 70–99)
Glucose-Capillary: 129 mg/dL — ABNORMAL HIGH (ref 70–99)

## 2018-06-22 LAB — MAGNESIUM: MAGNESIUM: 2.6 mg/dL — AB (ref 1.7–2.4)

## 2018-06-22 LAB — COMPREHENSIVE METABOLIC PANEL
ALK PHOS: 108 U/L (ref 38–126)
ALT: 44 U/L (ref 0–44)
AST: 40 U/L (ref 15–41)
Albumin: 2 g/dL — ABNORMAL LOW (ref 3.5–5.0)
Anion gap: 5 (ref 5–15)
BILIRUBIN TOTAL: 0.1 mg/dL — AB (ref 0.3–1.2)
BUN: 30 mg/dL — ABNORMAL HIGH (ref 8–23)
CALCIUM: 8.7 mg/dL — AB (ref 8.9–10.3)
CO2: 26 mmol/L (ref 22–32)
Chloride: 110 mmol/L (ref 98–111)
Creatinine, Ser: 0.92 mg/dL (ref 0.44–1.00)
GFR calc Af Amer: >60 mL/min (ref >60)
GFR calc non Af Amer: 58 mL/min — ABNORMAL LOW (ref >60)
Glucose, Bld: 130 mg/dL — ABNORMAL HIGH (ref 70–99)
POTASSIUM: 4.3 mmol/L (ref 3.5–5.1)
Sodium: 141 mmol/L (ref 135–145)
TOTAL PROTEIN: 5.1 g/dL — AB (ref 6.5–8.1)

## 2018-06-22 LAB — PHOSPHORUS: PHOSPHORUS: 3.9 mg/dL (ref 2.5–4.6)

## 2018-06-22 LAB — PREALBUMIN: Prealbumin: 13.4 mg/dL — ABNORMAL LOW (ref 18–38)

## 2018-06-22 LAB — TRIGLYCERIDES: TRIGLYCERIDES: 191 mg/dL — AB (ref <150)

## 2018-06-22 MED ORDER — FAT EMULSION PLANT BASED 20 % IV EMUL
240.0000 mL | INTRAVENOUS | Status: AC
  Administered 2018-06-22: 240 mL via INTRAVENOUS
  Filled 2018-06-22: qty 250

## 2018-06-22 MED ORDER — TRACE MINERALS CR-CU-MN-SE-ZN 10-1000-500-60 MCG/ML IV SOLN
INTRAVENOUS | Status: AC
  Administered 2018-06-22: 18:00:00 via INTRAVENOUS
  Filled 2018-06-22: qty 1800

## 2018-06-22 NOTE — Progress Notes (Signed)
Occupational Therapy Treatment Patient Details Name: Kristin [name] MRN: [id number] DOB: [birth year] Today's Date: 06/22/2018    History of present illness 77 yo female admitted with Abd pain, N/V. S/P Hartmann's procedure/colostomy 06/16/18. Hx of CKD, CVA, breast and lung ca.    OT comments  Pt with increased I this day  Follow Up Recommendations  Supervision/Assistance - 24 hour;Home health OT    Equipment Recommendations  None recommended by OT    Recommendations for Other Services      Precautions / Restrictions Precautions Precautions: Fall Precaution Comments: abd surgery; NG tube; L colostomy;  Restrictions Weight Bearing Restrictions: No       Mobility Bed Mobility Overal bed mobility: Needs Assistance Bed Mobility: Supine to Sit     Supine to sit: Min guard;HOB elevated     General bed mobility comments: HOB maximally elevated. Pt partially rolled towards R side and used bedrail to get to EOB. Increased time.   Transfers Overall transfer level: Needs assistance Equipment used: Rolling walker (2 wheeled) Transfers: Sit to/from [facility] Sit to Stand: Min assist         General transfer comment: Assist to rise, stabilize, control descent. VCs safety, hand placement. Increased time.     Balance Overall balance assessment: Needs assistance         Standing balance support: Bilateral upper extremity supported Standing balance-Leahy Scale: Poor Standing balance comment: requring RW currently                           ADL either performed or assessed with clinical judgement   ADL Overall ADL's : Needs assistance/impaired                         Toilet Transfer: Ambulation;RW;Min guard;Comfort height toilet   Toileting- Clothing Manipulation and Hygiene: Min guard         General ADL Comments: pt agreed to OOB and walked to BR. Pts familly reporting pt feeling down this day     Vision Patient Visual  Report: No change from baseline            Cognition Arousal/Alertness: Awake/alert Behavior During Therapy: WFL for tasks assessed/performed Overall Cognitive Status: Within Functional Limits for tasks assessed                                                     Pertinent Vitals/ Pain       Pain Assessment: Faces Faces Pain Scale: Hurts a little bit Pain Location: abdomen Pain Descriptors / Indicators: Sore;Discomfort Pain Intervention(s): Monitored during session     Prior Functioning/Environment              Frequency  Min 2X/week        Progress Toward Goals  OT Goals(current goals can now be found in the care plan section)     Acute Rehab OT Goals Patient Stated Goal: return to independence  Plan Discharge plan remains appropriate    Co-evaluation                 AM-PAC PT "6 Clicks" Daily Activity     Outcome Measure   Help from another person eating meals?: None Help from another person taking care of personal grooming?: A Little Help from  another person toileting, which includes using toliet, bedpan, or urinal?: A Little Help from another person bathing (including washing, rinsing, drying)?: A Little Help from another person to put on and taking off regular upper body clothing?: A Little Help from another person to put on and taking off regular lower body clothing?: A Little 6 Click Score: 19    End of Session    OT Visit Diagnosis: Muscle weakness (generalized) (M62.81)   Activity Tolerance Patient tolerated treatment well   Patient Left in bed;with call bell/phone within reach;with family/visitor present;with bed alarm set   Nurse Communication          Time: 4353-9122 OT Time Calculation (min): 16 min  Charges: OT General Charges $OT Visit: 1 Visit OT Treatments $Self Care/Home Management : 8-22 mins  [place], [place]   Betsy Pries 06/22/2018, 12:29 PM

## 2018-06-22 NOTE — Plan of Care (Signed)
Plan of care discussed with patient 

## 2018-06-22 NOTE — Progress Notes (Signed)
PHARMACY - ADULT TOTAL PARENTERAL NUTRITION CONSULT NOTE   Pharmacy Consult for TPN Indication: Bowel obstruction  Patient Measurements: Height: 5' 6.5" (168.9 cm) Weight: 143 lb 1.3 oz (64.9 kg) IBW/kg (Calculated) : 60.45 TPN AdjBW (KG): 65.6 Body mass index is 22.75 kg/m.  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  Abd CT on 8/22 showed "short segmental thickening in the rectosigmoid causing large and small-bowel obstruction."  She had sigmoidoscopy on 8/23 that showed stenosis in the sigmoid colon with decompression tube placed.  TPN started on 8/26 for bowel obstruction.  Significant events:  - 8/27: low anterior hartmann resection, end colostomy, lysis of adhesions - 9/1:  NGT removed, colostomy with output.  Advancing diet. - 9/2: patient feels worse today per notes, per CCS to hold off on advancing diet until ileus resolves  Today:   Glucose (goal <150): within goal range on minimal SSI.  Electrolytes: Lytes WNL except Mag slightly elevated at 2.6  Renal: Scr ok  LFTs: AST slightly elevated at 45 on 8/29, Tbili low  TGs: 99 (8/27), 191 (9/2) - rising, no changes as of yet, continue to monitor  Prealbumin: 6.5 (8/27), 13.4 (9/2) - improving  NUTRITIONAL GOALS                                                                                             RD recs (8/26):  Kcal:  1800-2000, Protein:  80-90g  Clinimix E 5/15 at  a goal rate of 75 ml/hr + 20% fat emulsion at 73ml/hr for 12hrs to provide: 90 g/day protein, 1758 Kcal/day.  PLAN  At 1800 today:  Continue Clinimix E 5/15 at goal rate 75 ml/hr since diet not to be advanced yet per CCS  20% fat emulsion at 20 ml/hr x12 hrs.  Add pepcid 40 mg to TPN bag  TPN to contain standard multivitamins and trace elements.  Continue IVF per MD  Continue SSI q6h  TPN lab panels on Mondays & Thursdays.  F/u plans for advancing/tolerating diet.  Recommend to discontinue TPN when enteral intake is >/= 60% of caloric needs.   Adrian Saran, PharmD, BCPS Pager [phone] 06/22/2018 10:23 AM

## 2018-06-22 NOTE — Progress Notes (Signed)
Physical Therapy Treatment Patient Details Name: Kristin [name] MRN: [id number] DOB: 29-Jul-1941 Today's Date: 06/22/2018    History of Present Illness 77 yo female admitted with Abd pain, N/V. S/P Hartmann's procedure/colostomy 06/16/18. Hx of CKD, CVA, breast and lung ca.     PT Comments    Pt progressing toward PT goals, incr gait distance/tolerance to activity today; will benefit from continued PT in acute setting, encouraged pt to amb with nursing also 1-2 more times today  Follow Up Recommendations  Home health PT;Supervision - Intermittent     Equipment Recommendations  None recommended by PT    Recommendations for Other Services       Precautions / Restrictions Precautions Precautions: Fall Precaution Comments: abd surgery; NG tube; L colostomy;  Restrictions Weight Bearing Restrictions: No    Mobility  Bed Mobility               General bed mobility comments: in chair on arrival  Transfers Overall transfer level: Needs assistance Equipment used: Rolling walker (2 wheeled) Transfers: Sit to/from Stand Sit to Stand: Min guard         General transfer comment: cues for overall safety, correct hand placement  Ambulation/Gait Ambulation/Gait assistance: Min guard Gait Distance (Feet): 170 Feet Assistive device: Rolling walker (2 wheeled) Gait Pattern/deviations: Step-through pattern;Decreased stride length;Trunk flexed Gait velocity: decr   General Gait Details: min/guard for safety, no overt lOB, incr time needed to maneuver RW   Stairs             Wheelchair Mobility    Modified Rankin (Stroke Patients Only)       Balance             Standing balance-Leahy Scale: Poor Standing balance comment: requring RW currently                            Cognition Arousal/Alertness: Awake/alert Behavior During Therapy: WFL for tasks assessed/performed Overall Cognitive Status: Within Functional Limits for tasks assessed                                         Exercises      General Comments        Pertinent Vitals/Pain Pain Assessment: Faces Faces Pain Scale: Hurts a little bit Pain Location: abdomen Pain Descriptors / Indicators: Sore;Discomfort Pain Intervention(s): Monitored during session;Repositioned    Home Living                      Prior Function            PT Goals (current goals can now be found in the care plan section) Acute Rehab PT Goals Patient Stated Goal: return to independence PT Goal Formulation: With patient Time For Goal Achievement: 07/02/18 Potential to Achieve Goals: Good Progress towards PT goals: Progressing toward goals    Frequency    Min 3X/week      PT Plan Current plan remains appropriate    Co-evaluation              AM-PAC PT "6 Clicks" Daily Activity  Outcome Measure  Difficulty turning over in bed (including adjusting bedclothes, sheets and blankets)?: A Lot Difficulty moving from lying on back to sitting on the side of the bed? : Unable Difficulty sitting down on and standing up from a chair with arms (  e.g., wheelchair, bedside commode, etc,.)?: A Little Help needed moving to and from a bed to chair (including a wheelchair)?: A Little Help needed walking in hospital room?: A Little Help needed climbing 3-5 steps with a railing? : A Little 6 Click Score: 15    End of Session   Activity Tolerance: Patient limited by fatigue Patient left: in chair;with call bell/phone within reach;with family/visitor present   PT Visit Diagnosis: Unsteadiness on feet (R26.81);Difficulty in walking, not elsewhere classified (R26.2);Muscle weakness (generalized) (M62.81)     Time: 9150-5697 PT Time Calculation (min) (ACUTE ONLY): 11 min  Charges:  $Gait Training: 8-22 mins                     Kenyon Ana, PT Pager: [phone] 06/22/2018   [facility] [phone]    [facility] 06/22/2018, 11:50  AM

## 2018-06-22 NOTE — Progress Notes (Signed)
Patient ID: Kristin [name], female   DOB: 1941/01/12, 78 y.o.   MRN: [id number] [facility] Progress Note:   5 Days Post-Op  Subjective: Feels worse today.  Mostly tired with some mild nausea   Objective: Vital signs in last 24 hours: Temp:  [97.4 F (36.3 C)-98.3 F (36.8 C)] 98.3 F (36.8 C) (09/02 0552) Pulse Rate:  [88-96] 95 (09/02 0552) Resp:  [15-16] 15 (09/02 0552) BP: (107-129)/(54-75) 107/54 (09/02 0552) SpO2:  [99 %-100 %] 99 % (09/02 0552) Weight:  [64.9 kg] 64.9 kg (09/01 2110)  Intake/Output from previous day: 09/01 0701 - 09/02 0700 In: 4280.3 [P.O.:1110; I.V.:3020.2; IV Piggyback:150.1] Out: 3202 [Urine:2950; Drains:27; Stool:225] Intake/Output this shift: No intake/output data recorded.  Physical Exam:  Lying in bed abd distended, appropriately tender to palpation  Lab Results:  Results for orders placed or performed during the hospital encounter of 06/11/18 (from the past 48 hour(s))  Glucose, capillary     Status: Abnormal   Collection Time: 06/20/18 11:29 AM  Result Value Ref Range   Glucose-Capillary 148 (H) 70 - 99 mg/dL  Glucose, capillary     Status: Abnormal   Collection Time: 06/20/18  5:59 PM  Result Value Ref Range   Glucose-Capillary 124 (H) 70 - 99 mg/dL  Glucose, capillary     Status: Abnormal   Collection Time: 06/21/18 12:21 AM  Result Value Ref Range   Glucose-Capillary 137 (H) 70 - 99 mg/dL  Glucose, capillary     Status: Abnormal   Collection Time: 06/21/18  6:21 AM  Result Value Ref Range   Glucose-Capillary 104 (H) 70 - 99 mg/dL  Basic metabolic panel     Status: Abnormal   Collection Time: 06/21/18  6:23 AM  Result Value Ref Range   Sodium 140 135 - 145 mmol/L   Potassium 4.4 3.5 - 5.1 mmol/L   Chloride 105 98 - 111 mmol/L   CO2 27 22 - 32 mmol/L   Glucose, Bld 136 (H) 70 - 99 mg/dL   BUN 27 (H) 8 - 23 mg/dL   Creatinine, Ser 0.92 0.44 - 1.00 mg/dL   Calcium 8.6 (L) 8.9 - 10.3 mg/dL   GFR calc non Af Amer 58  (L) >60 mL/min   GFR calc Af Amer >60 >60 mL/min    Comment: (NOTE) The eGFR has been calculated using the CKD EPI equation. This calculation has not been validated in all clinical situations. eGFR's persistently <60 mL/min signify possible Chronic Kidney Disease.    Anion gap 8 5 - 15    Comment: Performed at [facility], [place] [address], [place], [place] [address]  Glucose, capillary     Status: Abnormal   Collection Time: 06/21/18 11:35 AM  Result Value Ref Range   Glucose-Capillary 125 (H) 70 - 99 mg/dL  Glucose, capillary     Status: Abnormal   Collection Time: 06/21/18  6:04 PM  Result Value Ref Range   Glucose-Capillary 120 (H) 70 - 99 mg/dL  Glucose, capillary     Status: Abnormal   Collection Time: 06/22/18 12:30 AM  Result Value Ref Range   Glucose-Capillary 129 (H) 70 - 99 mg/dL  Comprehensive metabolic panel     Status: Abnormal   Collection Time: 06/22/18  4:05 AM  Result Value Ref Range   Sodium 141 135 - 145 mmol/L   Potassium 4.3 3.5 - 5.1 mmol/L   Chloride 110 98 - 111 mmol/L   CO2 26 22 - 32 mmol/L  Glucose, Bld 130 (H) 70 - 99 mg/dL   BUN 30 (H) 8 - 23 mg/dL   Creatinine, Ser 0.92 0.44 - 1.00 mg/dL   Calcium 8.7 (L) 8.9 - 10.3 mg/dL   Total Protein 5.1 (L) 6.5 - 8.1 g/dL   Albumin 2.0 (L) 3.5 - 5.0 g/dL   AST 40 15 - 41 U/L   ALT 44 0 - 44 U/L   Alkaline Phosphatase 108 38 - 126 U/L   Total Bilirubin 0.1 (L) 0.3 - 1.2 mg/dL   GFR calc non Af Amer 58 (L) >60 mL/min   GFR calc Af Amer >60 >60 mL/min    Comment: (NOTE) The eGFR has been calculated using the CKD EPI equation. This calculation has not been validated in all clinical situations. eGFR's persistently <60 mL/min signify possible Chronic Kidney Disease.    Anion gap 5 5 - 15    Comment: Performed at [facility], [place] [address], [place], [place] [address]  Magnesium     Status: Abnormal   Collection Time: 06/22/18  4:05 AM  Result Value Ref Range    Magnesium 2.6 (H) 1.7 - 2.4 mg/dL    Comment: Performed at [facility], [place] [address], [place], [place] [address]  Phosphorus     Status: None   Collection Time: 06/22/18  4:05 AM  Result Value Ref Range   Phosphorus 3.9 2.5 - 4.6 mg/dL    Comment: Performed at [facility], [place] [address] [place], [place] [address]  CBC     Status: Abnormal   Collection Time: 06/22/18  4:05 AM  Result Value Ref Range   WBC 7.6 4.0 - 10.5 K/uL   RBC 3.07 (L) 3.87 - 5.11 MIL/uL   Hemoglobin 8.9 (L) 12.0 - 15.0 g/dL   HCT 27.3 (L) 36.0 - 46.0 %   MCV 88.9 78.0 - 100.0 fL   MCH 29.0 26.0 - 34.0 pg   MCHC 32.6 30.0 - 36.0 g/dL   RDW 14.3 11.5 - 15.5 %   Platelets 257 150 - 400 K/uL    Comment: Performed at [facility], [place] [address], [place], [place] [address]  Differential     Status: Abnormal   Collection Time: 06/22/18  4:05 AM  Result Value Ref Range   Neutrophils Relative % 62 %   Neutro Abs 4.6 1.7 - 7.7 K/uL   Lymphocytes Relative 17 %   Lymphs Abs 1.3 0.7 - 4.0 K/uL   Monocytes Relative 16 %   Monocytes Absolute 1.2 (H) 0.1 - 1.0 K/uL   Eosinophils Relative 5 %   Eosinophils Absolute 0.4 0.0 - 0.7 K/uL   Basophils Relative 0 %   Basophils Absolute 0.0 0.0 - 0.1 K/uL    Comment: Performed at [facility], [place] [address], [place], [place] [address]  Triglycerides     Status: Abnormal   Collection Time: 06/22/18  4:05 AM  Result Value Ref Range   Triglycerides 191 (H) <150 mg/dL    Comment: Performed at [facility], [place] [address], [place],  [address]  Prealbumin     Status: Abnormal   Collection Time: 06/22/18  4:05 AM  Result Value Ref Range   Prealbumin 13.4 (L) 18 - 38 mg/dL    Comment: Performed at [facility], [place] [address], [place], [place] [address]  Glucose, capillary     Status: Abnormal   Collection Time: 06/22/18  5:49 AM  Result Value Ref Range    Glucose-Capillary 102 (H) 70 - 99 mg/dL    Radiology/Results: No results found.  Anti-infectives: Anti-infectives (From admission, onward)   Start     Dose/Rate Route Frequency Ordered Stop   06/17/18 0800  piperacillin-tazobactam (ZOSYN) IVPB 3.375 g     3.375 g 12.5 mL/hr over 240 Minutes Intravenous Every 8 hours 06/17/18 0726     06/16/18 2200  cefoTEtan (CEFOTAN) 2 g in sodium chloride 0.9 % 100 mL IVPB     2 g 200 mL/hr over 30 Minutes Intravenous Every 12 hours 06/16/18 1339 06/17/18 0017   06/16/18 1206  clindamycin (CLEOCIN) 900 mg, gentamicin (GARAMYCIN) 240 mg in sodium chloride 0.9 % 1,000 mL for intraperitoneal lavage  Status:  Discontinued       As needed 06/16/18 1206 06/16/18 1235   06/16/18 0600  cefoTEtan (CEFOTAN) 2 g in sodium chloride 0.9 % 100 mL IVPB     2 g 200 mL/hr over 30 Minutes Intravenous On call to O.R. 06/15/18 1301 06/16/18 1027   06/16/18 0600  clindamycin (CLEOCIN) 900 mg, gentamicin (GARAMYCIN) 240 mg in sodium chloride 0.9 % 1,000 mL for intraperitoneal lavage  Status:  Discontinued      Intraperitoneal To Surgery 06/15/18 1301 06/16/18 1333   06/12/18 1700  metroNIDAZOLE (FLAGYL) IVPB 500 mg  Status:  Discontinued     500 mg 100 mL/hr over 60 Minutes Intravenous Every 8 hours 06/12/18 1604 06/17/18 0725   06/11/18 2245  cefTRIAXone (ROCEPHIN) 1 g in sodium chloride 0.9 % 100 mL IVPB  Status:  Discontinued     1 g 200 mL/hr over 30 Minutes Intravenous Daily at bedtime 06/11/18 2204 06/15/18 1030      Assessment/Plan: Problem List: Patient Active Problem List   Diagnosis Date Noted  . 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 Malnutrition of moderate degree 06/15/2018  . CKD (chronic kidney disease), stage III ([place]) 06/11/2018  . Large bowel obstruction ([place]) 06/11/2018  . Acute lower UTI 06/11/2018  . Encounter for antineoplastic chemotherapy 10/28/2016  . Anemia of chronic disease 09/21/2016  . OA (osteoarthritis) of hip  08/07/2016  . S/P hip replacement, left 08/07/2016  . 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 Genetic testing 06/20/2016  . Family history of breast cancer   . Family history of colon cancer   . Family history of brain cancer   . Family history of prostate cancer   . Melanocytic nevi of trunk 03/07/2016  . Seborrheic keratoses of trunk 03/06/2016  . Status post partial colectomy 03/06/2016  . 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 Osteoporosis 04/25/2015  . Disc disorder 05/30/2014  . Arthritis of left hip 05/30/2014  . Occipital cerebral infarction ([place]) 04/30/2013  . Hypertension 04/29/2013  . Nonspecific abnormal finding in stool contents 07/31/2011  . Esophageal reflux 07/31/2011  . Benign neoplasm of colon 07/31/2011  . Hernia, hiatal 07/31/2011  . Cancer of upper lobe of left lung ([place]) 06/13/2011  . Personal history of breast cancer 06/13/2011  . Diverticulosis of colon (without mention of hemorrhage) 06/13/2011  . Adenocarcinoma of lung ([place]) 01/01/2011  . Status of breast implant 05/20/2010  . Menopausal syndrome 07/03/2004  . Hypercholesterolemia 01/05/2002  . Absence of bladder continence 12/02/2000  . Depression 12/02/2000    Will hold off on advancing diet until ileus resolves.   Ambulate in hall   5  Days Post-Op    LOS: 11 days   Rosario Adie, MD  [facility] [place] [facility]

## 2018-06-23 LAB — GLUCOSE, CAPILLARY
GLUCOSE-CAPILLARY: 110 mg/dL — AB (ref 70–99)
GLUCOSE-CAPILLARY: 113 mg/dL — AB (ref 70–99)
GLUCOSE-CAPILLARY: 125 mg/dL — AB (ref 70–99)
Glucose-Capillary: 122 mg/dL — ABNORMAL HIGH (ref 70–99)
Glucose-Capillary: 126 mg/dL — ABNORMAL HIGH (ref 70–99)

## 2018-06-23 LAB — BASIC METABOLIC PANEL
ANION GAP: 7 (ref 5–15)
BUN: 31 mg/dL — ABNORMAL HIGH (ref 8–23)
CALCIUM: 8.6 mg/dL — AB (ref 8.9–10.3)
CO2: 23 mmol/L (ref 22–32)
CREATININE: 0.95 mg/dL (ref 0.44–1.00)
Chloride: 108 mmol/L (ref 98–111)
GFR, EST NON AFRICAN AMERICAN: 55 mL/min — AB (ref >60)
Glucose, Bld: 130 mg/dL — ABNORMAL HIGH (ref 70–99)
Potassium: 4.6 mmol/L (ref 3.5–5.1)
SODIUM: 138 mmol/L (ref 135–145)

## 2018-06-23 LAB — PHOSPHORUS: PHOSPHORUS: 4 mg/dL (ref 2.5–4.6)

## 2018-06-23 LAB — MAGNESIUM: MAGNESIUM: 2.6 mg/dL — AB (ref 1.7–2.4)

## 2018-06-23 MED ORDER — TRACE MINERALS CR-CU-MN-SE-ZN 10-1000-500-60 MCG/ML IV SOLN
INTRAVENOUS | Status: AC
  Administered 2018-06-23: 17:00:00 via INTRAVENOUS
  Filled 2018-06-23: qty 1800

## 2018-06-23 MED ORDER — PHENOL 1.4 % MT LIQD
1.0000 | OROMUCOSAL | Status: DC | PRN
  Filled 2018-06-23: qty 177

## 2018-06-23 MED ORDER — FAT EMULSION PLANT BASED 20 % IV EMUL
240.0000 mL | INTRAVENOUS | Status: AC
  Administered 2018-06-23: 240 mL via INTRAVENOUS
  Filled 2018-06-23: qty 250

## 2018-06-23 NOTE — Progress Notes (Signed)
Occupational Therapy Treatment Patient Details Name: [name] MRN: [id number] DOB: 07/18/1941 Today's Date: 06/23/2018    History of present illness 77 yo female admitted with Abd pain, N/V. S/P Hartmann's procedure/colostomy 06/16/18. Hx of CKD, CVA, breast and lung ca.    OT comments  Orange theraband issued and education provided  Follow Up Recommendations  Supervision/Assistance - 24 hour;Home health OT    Equipment Recommendations  None recommended by OT    Recommendations for Other Services      Precautions / Restrictions Precautions Precautions: Fall Precaution Comments: abd surgery; NG tube; L colostomy;  Restrictions Weight Bearing Restrictions: No              ADL either performed or assessed with clinical judgement   ADL Overall ADL's : Needs assistance/impaired                                       General ADL Comments: pt just back to bed but did agree to BUE .  OT provided orange theraband and pt thankful     Vision Patient Visual Report: No change from baseline            Cognition Arousal/Alertness: Awake/alert Behavior During Therapy: WFL for tasks assessed/performed Overall Cognitive Status: Within Functional Limits for tasks assessed                                          Exercises Shoulder Exercises Shoulder Flexion: AROM;Both;10 reps Elbow Flexion: AROM;Both;10 reps Elbow Extension: AROM;Both;10 reps Wrist Flexion: AROM;10 reps Wrist Extension: AROM;10 reps Digit Composite Flexion: AROM;10 reps Composite Extension: AROM;10 reps   Shoulder Instructions  Exercises with shoulder and elbow performed with oraange theraband     General Comments      Pertinent Vitals/ Pain       Pain Assessment: No/denies pain         Frequency  Min 2X/week        Progress Toward Goals  OT Goals(current goals can now be found in the care plan section)  Progress towards OT goals: Progressing toward  goals     Plan Discharge plan remains appropriate       AM-PAC PT "6 Clicks" Daily Activity     Outcome Measure   Help from another person eating meals?: None Help from another person taking care of personal grooming?: A Little Help from another person toileting, which includes using toliet, bedpan, or urinal?: A Little Help from another person bathing (including washing, rinsing, drying)?: A Little Help from another person to put on and taking off regular upper body clothing?: A Little Help from another person to put on and taking off regular lower body clothing?: A Little 6 Click Score: 19    End of Session    OT Visit Diagnosis: Muscle weakness (generalized) (M62.81)   Activity Tolerance Patient tolerated treatment well   Patient Left in bed;with call bell/phone within reach;with family/visitor present;with bed alarm set   Nurse Communication Mobility status          Charges: OT Treatments $Therapeutic Exercise: 8-22 mins  [place], [place]   Betsy Pries 06/23/2018, 5:07 PM

## 2018-06-23 NOTE — Progress Notes (Signed)
PHARMACY - ADULT TOTAL PARENTERAL NUTRITION CONSULT NOTE   Pharmacy Consult for TPN Indication: Bowel obstruction  Patient Measurements: Height: 5' 6.5" (168.9 cm) Weight: 148 lb 2.4 oz (67.2 kg) IBW/kg (Calculated) : 60.45 TPN AdjBW (KG): 65.6 Body mass index is 23.55 kg/m.  Insulin Requirements: on sensitive SSI q6h (1 units / 24 hrs) - no hx DM  Current Nutrition: TPN - Advance to full liquid diet 9/3   IVF: LR at 50 ml/hr, NS at 10 ml/hr  Central access: PICC placed on 8/24 TPN start date: 8/26  ASSESSMENT                                                                                                          HPI: Patient's a 77 y.o F with hx lung cancer, breast cancer, and adenoma of the ascending colon (s/p colectomy), presented to the ED on 06/11/18 with c/o abdominal pain and n/v.  Abd CT on 8/22 showed "short segmental thickening in the rectosigmoid causing large and small-bowel obstruction."  She had sigmoidoscopy on 8/23 that showed stenosis in the sigmoid colon with decompression tube placed.  TPN started on 8/26 for bowel obstruction.  Significant events:  - 8/27: low anterior hartmann resection, end colostomy, lysis of adhesions - 9/1:  NGT removed, colostomy with output.  Advancing diet. - 9/2: patient feels worse today per notes, per CCS to hold off on advancing diet until ileus resolves - 9/3 advance diet to fulls, will wean TPN tomorrow if tolerates  Today:   Glucose (goal <150): within goal range on minimal SSI.  Electrolytes: Lytes WNL except Mag slightly elevated at 2.6  Renal: Scr ok  LFTs: AST slightly elevated at 45 on 8/29, Tbili low  TGs: 99 (8/27), 191 (9/2) - rising, no changes as of yet, continue to monitor  Prealbumin: 6.5 (8/27), 13.4 (9/2) - improving  NUTRITIONAL GOALS                                                                                             RD recs (8/26):  Kcal:  1800-2000, Protein:  80-90g  Clinimix E 5/15 at a  goal rate of 75 ml/hr + 20% fat emulsion at 23ml/hr for 12hrs to provide: 90 g/day protein, 1758 Kcal/day.  PLAN  At 1800 today:  Continue Clinimix E 5/15 at goal rate 75 ml/hr   20% fat emulsion at 20 ml/hr x12 hrs.  Add pepcid 40 mg to TPN bag  TPN to contain standard multivitamins and trace elements.  Continue IVF per MD  Continue SSI q6h  TPN lab panels on Mondays & Thursdays.  F/u plans for advancing/tolerating diet.  Recommend to discontinue TPN when enteral intake is >/= 60% of caloric needs.   Dolly Rias RPh 06/23/2018, 10:47 AM Pager [phone]

## 2018-06-23 NOTE — Progress Notes (Addendum)
7 Days Post-Op    CC:  Abdominal pain, nausea and vomiting  Subjective: She is doing better, she has walked some but not allot.  She gets the IS to about 500 with encouragement.  Sites all look fine, she has stool coming from the ostomy.    She has a list of folks to help with the ostomy,but doesn't feel she can do it yet.  PT and OT are seeing her, both recommending home PT/OT Supervision/assistance at home.   She does not want to go to SNF.  Objective: Vital signs in last 24 hours: Temp:  [97.4 F (36.3 C)-98.5 F (36.9 C)] 98.5 F (36.9 C) (09/03 0558) Pulse Rate:  [88-92] 92 (09/03 0558) Resp:  [19-26] 19 (09/03 0558) BP: (114-121)/(62-68) 121/62 (09/03 0558) SpO2:  [97 %-100 %] 97 % (09/03 0558) Last BM Date: 06/22/18 400 PO 2400 IV 2375 urine Drain 76 Stool 275 Afebrile, VSS Labs OK Intake/Output from previous day: 09/02 0701 - 09/03 0700 In: 2882.1 [P.O.:400; I.V.:2322.2; IV Piggyback:109.9] Out: 2726 [Urine:2375; Drains:76; Stool:275] Intake/Output this shift: Total I/O In: -  Out: 600 [Urine:600]  General appearance: alert, cooperative and no distress Resp: clear to auscultation bilaterally GI: soft, + BS, colostomy working well, tolerating clears, drain serous.  Lab Results:  Recent Labs    06/22/18 0405  WBC 7.6  HGB 8.9*  HCT 27.3*  PLT 257    BMET Recent Labs    06/22/18 0405 06/23/18 0412  NA 141 138  K 4.3 4.6  CL 110 108  CO2 26 23  GLUCOSE 130* 130*  BUN 30* 31*  CREATININE 0.92 0.95  CALCIUM 8.7* 8.6*   PT/INR No results for input(s): LABPROT, INR in the last 72 hours.  Recent Labs  Lab 06/18/18 0359 06/22/18 0405  AST 45* 40  ALT 33 44  ALKPHOS 61 108  BILITOT 0.2* 0.1*  PROT 4.8* 5.1*  ALBUMIN 2.0* 2.0*     Lipase     Component Value Date/Time   LIPASE 55 (H) 06/11/2018 1856     Medications: . brimonidine  1 drop Right Eye Q12H   And  . timolol  1 drop Right Eye Q12H  . cycloSPORINE  1 drop Both Eyes BID   . heparin  5,000 Units Subcutaneous Q8H  . insulin aspart  0-9 Units Subcutaneous Q6H  . lip balm  1 application Topical BID   Anti-infectives (From admission, onward)   Start     Dose/Rate Route Frequency Ordered Stop   06/17/18 0800  piperacillin-tazobactam (ZOSYN) IVPB 3.375 g     3.375 g 12.5 mL/hr over 240 Minutes Intravenous Every 8 hours 06/17/18 0726     06/16/18 2200  cefoTEtan (CEFOTAN) 2 g in sodium chloride 0.9 % 100 mL IVPB     2 g 200 mL/hr over 30 Minutes Intravenous Every 12 hours 06/16/18 1339 06/17/18 0017   06/16/18 1206  clindamycin (CLEOCIN) 900 mg, gentamicin (GARAMYCIN) 240 mg in sodium chloride 0.9 % 1,000 mL for intraperitoneal lavage  Status:  Discontinued       As needed 06/16/18 1206 06/16/18 1235   06/16/18 0600  cefoTEtan (CEFOTAN) 2 g in sodium chloride 0.9 % 100 mL IVPB     2 g 200 mL/hr over 30 Minutes Intravenous On call to O.R. 06/15/18 1301 06/16/18 1027   06/16/18 0600  clindamycin (CLEOCIN) 900 mg, gentamicin (GARAMYCIN) 240 mg in sodium chloride 0.9 % 1,000 mL for intraperitoneal lavage  Status:  Discontinued  Intraperitoneal To Surgery 06/15/18 1301 06/16/18 1333   06/12/18 1700  metroNIDAZOLE (FLAGYL) IVPB 500 mg  Status:  Discontinued     500 mg 100 mL/hr over 60 Minutes Intravenous Every 8 hours 06/12/18 1604 06/17/18 0725   06/11/18 2245  cefTRIAXone (ROCEPHIN) 1 g in sodium chloride 0.9 % 100 mL IVPB  Status:  Discontinued     1 g 200 mL/hr over 30 Minutes Intravenous Daily at bedtime 06/11/18 2204 06/15/18 1030     Assessment/Plan H/o colon/lung/breast cancer - currently on chemo, followed by Dr. Julien Nordmann S/p robotic right hemicolectomy in May 2017 by Dr. 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 FIBROMUSCULAR HYPERTROPHY CONSISTENT WITH STRICTURE. NO EVIDENCE OF MALIGNANCY  - awaiting return in bowel function  - JP drain SS  ID - cefotan x1 8/27, flagyl 8/23>>8/28, rocephin 8/22>>8/26; zosyn 8/28 =>> day 7 FEN - TPN/clear liquids VTE -SCDs, SQ heparin Foley -none  Plan:  Start setting up home health, advance diet to fulls, increase her walking and make her work on the IS  If she does well with full liquids we will wean off TNA tomorrow.    CBC was normal yesterday, if normal tomorrow will DC abx.    I have put in home health orders and case management request.     LOS: 12 days   JENNINGS,WILLARD 06/23/2018 [phone]  Agree with above.  Doing better with ambulation and feeling better.  Daughter, Betsy Pries, in room with patient.  She plans to go home.  She is married.  Alphonsa Overall, MD, [facility] Pager: [phone] Office phone:  [phone]

## 2018-06-23 NOTE — Care Management Note (Signed)
Case Management Note  Patient Details  Name: Kristin [name] MRN: [id number] Date of Birth: 1940/12/31  Subjective/Objective:          Spoke with patient at bedside. Confirmed HH arranged with [facility].           Action/Plan: Contacted [facility] with updated [place] orders.   Expected Discharge Date:                  Expected Discharge Plan:  [place]  In-House Referral:  NA  Discharge planning Services  CM Consult  Post Acute Care Choice:  Home Health Choice offered to:  Patient, Adult Children  DME Arranged:  N/A DME Agency:  NA  HH Arranged:  RN, PT, OT HH Agency:  [facility]  Status of Service:  Completed, signed off  If discussed at [place] of Stay Meetings, dates discussed:    Additional Comments:  Guadalupe Maple, RN 06/23/2018, 2:00 PM

## 2018-06-23 NOTE — Consult Note (Signed)
[place] Nurse ostomy follow up Stoma type/location: LUQ, colostomy Stomal assessment/size: 1 1/2" round, budded, pink, moist Peristomal assessment: intact  Treatment options for stomal/peristomal skin: NA Output thin, green/brown stool + flatus Ostomy pouching: 1pc.convex    Education provided:  Explained stoma characteristics (budded, flush, color, texture, care) Daughter attempted pouch change however she cut the new pouch and I assisted with second pouch cutting. She did not clean the skin, seemed to be afraid. Reassured her on this skill.  Education on emptying when 1/3 to 1/2 full and how to empty; insisted that patient learn to open and close pouch and we went over how to empty.  Explained that she would need to be able to do this to be able to go home. Demonstrated use of wick to clean spout  Discussed treatment of peristomal skin, daughter discussed the use of saline wipes.  Reminded her and the patient that some products may leave a residue and requested they use only water.  Answered patient/family questions:  Patient and daughter will need additional support.  Husband is primary assistant at home, he was not present. Daughter reports she will only be here for another week.  Needs HHRN for support with care and supplies at home.  [place] Nurse will follow along with you for continued support with ostomy teaching and care [place] MSN, RN, [place], CNS, [place] [phone]     Enrolled patient in [place] Discharge program: Yes/No

## 2018-06-24 ENCOUNTER — Inpatient Hospital Stay (HOSPITAL_COMMUNITY): Payer: Medicare Other

## 2018-06-24 LAB — CBC
HCT: 26.4 % — ABNORMAL LOW (ref 36.0–46.0)
Hemoglobin: 8.7 g/dL — ABNORMAL LOW (ref 12.0–15.0)
MCH: 29.7 pg (ref 26.0–34.0)
MCHC: 33 g/dL (ref 30.0–36.0)
MCV: 90.1 fL (ref 78.0–100.0)
PLATELETS: 328 10*3/uL (ref 150–400)
RBC: 2.93 MIL/uL — ABNORMAL LOW (ref 3.87–5.11)
RDW: 14.5 % (ref 11.5–15.5)
WBC: 8.2 10*3/uL (ref 4.0–10.5)

## 2018-06-24 LAB — GLUCOSE, CAPILLARY: GLUCOSE-CAPILLARY: 122 mg/dL — AB (ref 70–99)

## 2018-06-24 MED ORDER — TAB-A-VITE/IRON PO TABS
1.0000 | ORAL_TABLET | Freq: Every day | ORAL | Status: DC
  Administered 2018-06-24 – 2018-06-25 (×2): 1 via ORAL
  Filled 2018-06-24 (×2): qty 1

## 2018-06-24 MED ORDER — ENSURE ENLIVE PO LIQD
237.0000 mL | Freq: Two times a day (BID) | ORAL | Status: DC
  Administered 2018-06-25: 237 mL via ORAL

## 2018-06-24 MED ORDER — ESCITALOPRAM OXALATE 10 MG PO TABS
10.0000 mg | ORAL_TABLET | Freq: Every day | ORAL | Status: DC
  Administered 2018-06-24 – 2018-06-25 (×2): 10 mg via ORAL
  Filled 2018-06-24 (×2): qty 1

## 2018-06-24 MED ORDER — CLOPIDOGREL BISULFATE 75 MG PO TABS
75.0000 mg | ORAL_TABLET | Freq: Every day | ORAL | Status: DC
  Administered 2018-06-24 – 2018-06-25 (×2): 75 mg via ORAL
  Filled 2018-06-24 (×2): qty 1

## 2018-06-24 MED ORDER — FAMOTIDINE 20 MG PO TABS
40.0000 mg | ORAL_TABLET | Freq: Every day | ORAL | Status: DC
  Administered 2018-06-25: 40 mg via ORAL
  Filled 2018-06-24: qty 2

## 2018-06-24 NOTE — Consult Note (Addendum)
[place] Nurse ostomy follow up Stoma type/location: LUQ colostomy Stomal assessment/size: pink, moist, above skin level. Did not remove pouch that was placed yesterday. No leaking has occurred. Peristomal assessment: N/A Treatment options for stomal/peristomal skin: NA Output green brown stool Ostomy pouching: 1pc convex   Education provided:  Education: Discussed ostomy care with patient who states "I haven't learned about it much yet". When asked why she stated when she gets home and can stand in the mirror she will be more participatory. States it is painful to lean up to look at the area. Her daughter and another family member have been the people learning. I reminded her that her daughter would be going home soon and that will leave her and her husband. Husband is present but no interest in learning shown. Had no questions about anything. Patient confident she and family members will be able to provide all care at home. Supplies are in room in a bag for her to take home. HH has been arranged with [facility].  She states she believes she will go home tomorrow. States she has no questions for me. [place] team will continue to follow while pt in house.  Enrolled patient in [place] Start Discharge program: already enrolled Fara Olden, RN-C, [place], [place] Wound Treatment Associate Ostomy Care Associate   Returned to ask pt to practice emptying ostomy pouch since CCS note looks like she will be going home tomorrow. Pt states she is not going to be doing it until she feels better and is up walking. Encouraged her to get up now and ambulate but she did not want to. Explained that there would not be people at night to empty it and she says she has a family member staying for a couple of weeks and her sister lives next door and they would come if she called. Asked bedside nurse to encourage her to empty it next time it needs emptying.

## 2018-06-24 NOTE — Progress Notes (Signed)
Nutrition Follow-up  DOCUMENTATION CODES:   Non-severe (moderate) malnutrition in context of acute illness/injury  INTERVENTION:   TPN per Pharmacy-weaning off today Provide Ensure Enlive po BID, each supplement provides 350 kcal and 20 grams of protein  NUTRITION DIAGNOSIS:   Moderate Malnutrition related to acute illness(large bowel obstruction) as evidenced by moderate fat depletion, moderate muscle depletion, energy intake < 75% for > 7 days.  Ongoing.  GOAL:   Patient will meet greater than or equal to 90% of their needs  Meeting with TPN.  MONITOR:   PO intake, Supplement acceptance, Labs, Weight trends, I & O's  ASSESSMENT:   77 y.o. 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  Patient in room sleeping with son at bedside. Pt reports feeling hungry. Diet was advanced to soft diet today. Pt states she didn't eat much breakfast, only a few spoonfuls of cream of wheat. Pt plans to sleep then order lunch when she wakes up. Pt drinks dark chocolate Ensure supplements at home, she is willing to drink milk chocolate Ensure until she gets home. Encouraged pt to continue supplements at home.   Pt's TPN still at goal rate at time of visit. Per Pharmacy note, TPN to decrease to 40 ml/hr and then stop at 1400.   Medications: Multivitamin with minerals daily Labs reviewed: CBGs: 122 Elevated Mg  Diet Order:   Diet Order            DIET SOFT Room service appropriate? Yes; Fluid consistency: Thin  Diet effective now              EDUCATION NEEDS:   Education needs have been addressed  Skin:  Skin Assessment: Reviewed RN Assessment  Last BM:  9/4  Height:   Ht Readings from  Last 1 Encounters:  06/16/18 5' 6.5" (1.689 m)    Weight:   Wt Readings from Last 1 Encounters:  06/24/18 66.1 kg    Ideal Body Weight:  67.7 kg  BMI:  Body mass index is 23.17 kg/m.  Estimated Nutritional Needs:   Kcal:  1800-2000  Protein:  80-90g  Fluid:  2L/day  Clayton Bibles, MS, RD, LDN [place] Dietitian Pager: [phone] After Hours Pager: [phone]

## 2018-06-24 NOTE — Progress Notes (Addendum)
OT Cancellation Note  Patient Details Name: Kristin [name] MRN: [id number] DOB: 27-Jun-1941   Cancelled Treatment:    Reason Eval/Treat Not Completed: Other (comment). Followed up with pt for further OT needs. She feels comfortable with shower transfers and will have assist for adls. She is walking at a min guard level for safety.   Will sign off.  Grant Henkes 06/24/2018, 11:13 AM  Lesle Chris, OTR/L [phone] 06/24/2018

## 2018-06-24 NOTE — Discharge Summary (Addendum)
[facility] Discharge Summary   Patient ID: Kristin [name] MRN: [id number] DOB/AGE: Oct 08, 1941 77 y.o.  Admit date: 06/11/2018 Discharge date: 06/25/2018  Admitting Diagnosis: Bowel obstruction UTI H/o colon cancer  Discharge Diagnosis Patient Active Problem List   Diagnosis Date Noted  . 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 Malnutrition of moderate degree 06/15/2018  . CKD (chronic kidney disease), stage III ([place]) 06/11/2018  . Large bowel obstruction ([place]) 06/11/2018  . Acute lower UTI 06/11/2018  . Encounter for antineoplastic chemotherapy 10/28/2016  . Anemia of chronic disease 09/21/2016  . 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 S/P hip replacement, left 08/07/2016  . 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 Genetic testing 06/20/2016  . Family history of breast cancer   . Family history of colon cancer   . Family history of brain cancer   . Family history of prostate cancer   . Melanocytic nevi of trunk 03/07/2016  . Seborrheic keratoses of trunk 03/06/2016  . Status post partial colectomy 03/06/2016  . 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 Osteoporosis 04/25/2015  . Disc disorder 05/30/2014  . Arthritis of left hip 05/30/2014  . Occipital cerebral infarction ([place]) 04/30/2013  . Hypertension 04/29/2013  . Nonspecific abnormal finding in stool contents 07/31/2011  . Esophageal reflux 07/31/2011  . Benign neoplasm of colon 07/31/2011  . Hernia, hiatal 07/31/2011  . Cancer of upper lobe of left lung ([place]) 06/13/2011  . Personal history of breast cancer 06/13/2011  . Diverticulosis of colon (without mention of hemorrhage) 06/13/2011  . Adenocarcinoma of lung ([place]) 01/01/2011  . Status of breast implant 05/20/2010  . Menopausal syndrome 07/03/2004  . Hypercholesterolemia 01/05/2002  . Absence of bladder continence  12/02/2000  . Depression 12/02/2000    Consultants Gastroenterology Internal medicine  Imaging: Dg Chest 2 View  Result Date: 06/24/2018 CLINICAL DATA:  Cough overnight. Patient admitted for bowel obstruction. EXAM: CHEST - 2 VIEW COMPARISON:  Chest CT, 05/04/2018. FINDINGS: Cardiac silhouette is normal in size. Moderate hiatal hernia, stable from prior chest CTs. No mediastinal or hilar masses. No convincing adenopathy. Lungs are hyperexpanded. There is thickening of the bronchovascular markings most evident in the lung bases, right greater than left. There is interstitial thickening in the peripheral right lung base. These findings appear increased from the prior CT. There is no lung consolidation. No evidence of pulmonary edema. Pulmonary anastomosis staples are noted in the left upper lobe stable from the prior CT. No pleural effusion or pneumothorax. Skeletal structures are demineralized but grossly intact. Right-sided PICC is well positioned, tip at the caval atrial junction. IMPRESSION: 1. Thickened bronchovascular markings most evident in the lung bases, right greater than left, with new or increased peripheral right lung base interstitial thickening. Findings are consistent with bronchial inflammation, without evidence of pneumonia or pulmonary edema. Electronically Signed   By: Lajean Manes M.D.   On: 06/24/2018 12:55    Procedures Dr. Rush Landmark (06/12/18) - Flexible Sigmoidoscopy, bowel decompression with 10 F tube  Dr. Johney Maine (06/16/18) -  LAPAROSCOPIC LOW ANTERIOR HARTMANN RESECTION END COLOSTOMY LAPAROSCOPIC LYSIS OF ADHESIONS X 1 HOUR  Hospital Course:  [name] is a 77yo female PMH lung cancer on chemotherapy, h/o breast cancer, h/o CVA on plavix, and S/p robotic right hemicolectomy in May 2017 by Dr. Johney Maine for adenocarcinoma, who [facility]22 with 5-6 day history of abdominal pain and distention.  Workup showed large bowel obstruction. Patient was admitted and  GI consulted who performed flexible sigmoidoscopy, bowel decompression 8/23.  She was started on TNA for nutritional support. Unfortunately her obstruction persisted and she was taken to the operating room 8/27 for laparoscopic hartmann resection, end colostomy, laparoscopic lysis of adhesions. Surgical pathology confirmed diverticulitis with perforation as her source of obstruction, and no evidence of malignancy. Patient did have an ileus as expected postoperatively. Once bowel function returned diet was slowly advanced as tolerated and she was weaned off the TNA. JP drain was removed on 9/4. Patient worked with therapies during this admission. On 9/5, the patient was voiding well, tolerating diet, ambulating well, pain well controlled, vital signs stable, incisions c/d/i and felt stable for discharge home with home health.  Patient will follow up as below and knows to call with questions or concerns.    I have personally reviewed the patients medication history on the [place] controlled substance database.    Physical Exam: Gen: Alert, NAD, pleasant HEENT: EOM's intact, pupils equal and round Card: RRR Pulm: CTAB, effort normal Abd: Soft,ND, appropriately tender, +BS,lap incisions cdi, ostomypinkwith soft brown stool in pouch, previous JP drain site with cdi dressing in place Psych: A&Ox3  Skin: no rashes noted, warm and dry  Allergies as of 06/25/2018      Reactions   Doxycycline Nausea Only   Weak, stomach   Sulfa Antibiotics Rash      Medication List    STOP taking these medications   amLODipine 5 MG tablet Commonly known as:  NORVASC   cephALEXin 250 MG capsule Commonly known as:  KEFLEX   FOLIVANE-PLUS Caps   letrozole 2.5 MG tablet Commonly known as:  FEMARA   lisinopril 20 MG tablet Commonly known as:  PRINIVIL,ZESTRIL   osimertinib mesylate 80 MG tablet Commonly known as:  TAGRISSO     TAKE these medications   acetaminophen 325 MG tablet Commonly known as:   TYLENOL Take 2 tablets (650 mg total) by mouth every 6 (six) hours as needed for mild pain (or Fever >/= 101).   alendronate 70 MG tablet Commonly known as:  FOSAMAX Take 70 mg once a week by mouth.   brimonidine-timolol 0.2-0.5 % ophthalmic solution Commonly known as:  COMBIGAN Place 1 drop into the right eye every 12 (twelve) hours.   clopidogrel 75 MG tablet Commonly known as:  PLAVIX Take 75 mg by mouth daily.   cycloSPORINE 0.05 % ophthalmic emulsion Commonly known as:  RESTASIS Place 1 drop into both eyes 2 (two) times daily.   escitalopram 10 MG tablet Commonly known as:  LEXAPRO Take 10 mg by mouth daily.   famotidine 40 MG tablet Commonly known as:  PEPCID Take 40 mg by mouth daily.   feeding supplement (ENSURE ENLIVE) Liqd Take 237 mLs by mouth 2 (two) times daily between meals.   multivitamins with iron Tabs tablet Take 1 tablet by mouth daily.   ondansetron 4 MG disintegrating tablet Commonly known as:  ZOFRAN-ODT Take 1 tablet (4 mg total) by mouth every 8 (eight) hours as needed for nausea.   oxyCODONE 5 MG immediate release tablet Commonly known as:  Oxy IR/ROXICODONE Take 1 tablet (5 mg total) by mouth every 4 (four) hours as needed for severe pain.   Vitamin D3 2000 units capsule Take 2,000 Units daily by mouth.        Follow-up Information    Michael Boston, MD. Schedule an appointment as soon as possible for a  visit on 07/06/2018.   Specialty:  General Surgery Why:  Your appointment is at 10:30 AM.  Be at the office 30 minutes early for check in.  Bring photo ID and insurance information.   Contact information: [address] [place] [address] [place] Follow up.   Why:  For home health physical therapy and RN for ostomy care Contact information:        Address: [address] [place] [address] Phone: [phone]        Eber Hong, MD Follow up.   Specialty:   Internal Medicine Why:  CAll for follow up in 1-2 weeks and a review of your home medicines.  We have not restarted blood pressure medicines, because you have not needed them.  Take and record BP at home 3-4 times per day.  Take record to Dr. Brynda Greathouse. Contact information: [place] [address] [phone]        Curt Bears, MD. Call.   Specialty:  Oncology Why:  Call to make a follow up appointment with your oncologist Contact information: [place] [address] [phone]           Signed: Wellington Hampshire, [facility] Surgery 06/25/2018, 7:53 AM Pager: [phone] Consults: [phone]  Agree with above.  Alphonsa Overall, MD, [facility] Surgery Pager: [phone] Office phone:  [phone]

## 2018-06-24 NOTE — Care Management Important Message (Signed)
Important Message  Patient Details  Name: Cailen Texeira MRN: [id number] Date of Birth: 1941/08/18   Medicare Important Message Given:  Yes    Kerin Salen 06/24/2018, 11:38 AMImportant Message  Patient Details  Name: Onyx Edgley MRN: [id number] Date of Birth: July 21, 1941   Medicare Important Message Given:  Yes    Kerin Salen 06/24/2018, 11:37 AM

## 2018-06-24 NOTE — Progress Notes (Addendum)
8 Days Post-Op    CC:  Abdominal pain, nausea and vomiting   Subjective: Complaining of coughing and the bed making her back hurt.  She has some rales in the right base, sites look good, drain is serosanguinous.  She moves well in bed, but trying to drink in bed.    Objective: Vital signs in last 24 hours: Temp:  [97.8 F (36.6 C)-98.1 F (36.7 C)] 98.1 F (36.7 C) (09/04 0542) Pulse Rate:  [93-99] 99 (09/04 0542) Resp:  [16-20] 20 (09/04 0542) BP: (106-126)/(54-64) 126/54 (09/04 0542) SpO2:  [100 %] 100 % (09/04 0542) Weight:  [66.1 kg-67.2 kg] 66.1 kg (09/04 0700) Last BM Date: (colostomy) 480 PO 14777 IV 2750 urine Drain 5 ml Nothing from the ostomy recorded, but she has stool in the bag.  Afebrile, VSS WBC 8.2 Intake/Output from previous day: 09/03 0701 - 09/04 0700 In: 2057.1 [P.O.:480; I.V.:1477; IV Piggyback:100] Out: 2376 [Urine:2750; Drains:5] Intake/Output this shift: Total I/O In: -  Out: 450 [Urine:450]  General appearance: alert, cooperative, no distress and coughing some,  Resp: clear to auscultation bilaterally and few rales right base on exam GI: soft, sore, sites look fine.  Lab Results:  Recent Labs    06/22/18 0405 06/24/18 0517  WBC 7.6 8.2  HGB 8.9* 8.7*  HCT 27.3* 26.4*  PLT 257 328    BMET Recent Labs    06/22/18 0405 06/23/18 0412  NA 141 138  K 4.3 4.6  CL 110 108  CO2 26 23  GLUCOSE 130* 130*  BUN 30* 31*  CREATININE 0.92 0.95  CALCIUM 8.7* 8.6*   PT/INR No results for input(s): LABPROT, INR in the last 72 hours.  Recent Labs  Lab 06/18/18 0359 06/22/18 0405  AST 45* 40  ALT 33 44  ALKPHOS 61 108  BILITOT 0.2* 0.1*  PROT 4.8* 5.1*  ALBUMIN 2.0* 2.0*     Lipase     Component Value Date/Time   LIPASE 55 (H) 06/11/2018 1856     Medications: . brimonidine  1 drop Right Eye Q12H   And  . timolol  1 drop Right Eye Q12H  . cycloSPORINE  1 drop Both Eyes BID  . heparin  5,000 Units Subcutaneous Q8H  .  insulin aspart  0-9 Units Subcutaneous Q6H  . lip balm  1 application Topical BID    Assessment/Plan H/o colon/lung/breast cancer - currently on chemo, followed by Dr. Julien Nordmann S/p robotic right hemicolectomy in May 2017 by Dr. 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 FIBROMUSCULAR HYPERTROPHY CONSISTENT WITH STRICTURE. NO EVIDENCE OF MALIGNANCY, 06/16/18, Dr. Michael Boston             - awaiting return in bowel function             - JP drain SS  ID - cefotan x1 8/27, flagyl 8/23>>8/28, rocephin 8/22>>8/26; zosyn 8/28 =>> day 7 d/c 9/4 FEN - TPN/full liquids VTE -SCDs, SQ heparin Foley -none   Plan:  OOB for meals, CXR, wean TNA, DC drain, DC zosyn.   I will ask everyone to  aim for DC tomorrow if CXR is OK.  She is working on her IS now.    Restart Lexapro and Plavix, she was also on Norvasc, & lisinopril.  Tagrisso and Femara, but I will not restart that now.  Add MVI with FE for anemia.   LOS: 13 days    JENNINGS,WILLARD 06/24/2018 [phone]  Agree with above.  Her husband and granddaughter are in the room.  She should be ready to go home tomorrow. Need to continue to push ambulation.  Alphonsa Overall, MD, [facility] Surgery Pager: [phone] Office phone:  [phone]

## 2018-06-24 NOTE — Progress Notes (Signed)
PT Cancellation Note  Patient Details Name: Kristin [name] MRN: [id number] DOB: Oct 19, 1941   Cancelled Treatment:     PT attempted x 3 - pt has been up in halls with RW and family assist.  Will follow.   Lakai Moree 06/24/2018, 5:06 PM

## 2018-06-24 NOTE — Progress Notes (Signed)
Rt gave pt flutter valve. Pt knows and understands how to use. 

## 2018-06-24 NOTE — Progress Notes (Signed)
PHARMACY - ADULT TOTAL PARENTERAL NUTRITION CONSULT NOTE   Pharmacy Consult for TPN Indication: Bowel obstruction  Patient Measurements: Height: 5' 6.5" (168.9 cm) Weight: 145 lb 11.6 oz (66.1 kg) IBW/kg (Calculated) : 60.45 TPN AdjBW (KG): 65.6 Body mass index is 23.17 kg/m.  Insulin Requirements: on sensitive SSI q6h (2 units / 24 hrs) - no hx DM  Current Nutrition: TPN - Advance to full liquid diet 9/3   IVF: LR at 50 ml/hr, NS at 10 ml/hr  Central access: PICC placed on 8/24 TPN start date: 8/26  ASSESSMENT                                                                                                          HPI: Patient's a 77 y.o F with hx lung cancer, breast cancer, and adenoma of the ascending colon (s/p colectomy), presented to the ED on 06/11/18 with c/o abdominal pain and n/v.  Abd CT on 8/22 showed "short segmental thickening in the rectosigmoid causing large and small-bowel obstruction."  She had sigmoidoscopy on 8/23 that showed stenosis in the sigmoid colon with decompression tube placed.  TPN started on 8/26 for bowel obstruction.  Significant events:  - 8/27: low anterior hartmann resection, end colostomy, lysis of adhesions - 9/1:  NGT removed, colostomy with output.  Advancing diet. - 9/2: patient feels worse today per notes, per CCS to hold off on advancing diet until ileus resolves - 9/3 advance diet to fulls, will wean TPN tomorrow if tolerates - 9/4 per surgery wean TPN to off  Today: no new labs today  Glucose (goal <150): within goal range on minimal SSI.  Electrolytes: Lytes WNL except Mag slightly elevated at 2.6  Renal: Scr ok  LFTs: AST slightly elevated at 45 on 8/29, Tbili low  TGs: 99 (8/27), 191 (9/2) - rising, no changes as of yet, continue to monitor  Prealbumin: 6.5 (8/27), 13.4 (9/2) - improving  NUTRITIONAL GOALS                                                                                             RD recs (8/26):  Kcal:   1800-2000, Protein:  80-90g  Clinimix E 5/15 at a goal rate of 75 ml/hr + 20% fat emulsion at 60ml/hr for 12hrs to provide: 90 g/day protein, 1758 Kcal/day.  PLAN  At noon decrease Clinimix E 5/15 to 66mls/hr for 2 hours then stop  Change pepcid to 40mg  po daily  Continue IVF per MD  stop SSI/ CBG's  Stop TPN lab panels    Dolly Rias RPh 06/24/2018, 10:02 AM Pager [phone]

## 2018-06-25 MED ORDER — ENSURE ENLIVE PO LIQD: 237.0000 mL | Freq: Two times a day (BID) | ORAL | 12 refills | Status: AC

## 2018-06-25 MED ORDER — OXYCODONE HCL 5 MG PO TABS
5.0000 mg | ORAL_TABLET | ORAL | Status: DC | PRN
  Administered 2018-06-25: 5 mg via ORAL
  Filled 2018-06-25: qty 1

## 2018-06-25 MED ORDER — HYDROGEN PEROXIDE 3 % EX SOLN
CUTANEOUS | Status: AC
  Filled 2018-06-25: qty 473

## 2018-06-25 MED ORDER — TAB-A-VITE/IRON PO TABS: 1.0000 | ORAL_TABLET | Freq: Every day | ORAL | 0 refills | Status: AC

## 2018-06-25 MED ORDER — ACETAMINOPHEN 325 MG PO TABS: 650.0000 mg | ORAL_TABLET | Freq: Four times a day (QID) | ORAL | Status: DC | PRN

## 2018-06-25 MED ORDER — ONDANSETRON 4 MG PO TBDP: 4.0000 mg | ORAL_TABLET | Freq: Three times a day (TID) | ORAL | 0 refills | Status: DC | PRN

## 2018-06-25 MED ORDER — OXYCODONE HCL 5 MG PO TABS: 5.0000 mg | ORAL_TABLET | ORAL | 0 refills | Status: DC | PRN

## 2018-07-29 ENCOUNTER — Encounter: Payer: Self-pay | Admitting: Internal Medicine

## 2018-07-29 ENCOUNTER — Inpatient Hospital Stay: Payer: Medicare Other | Attending: Internal Medicine | Admitting: Internal Medicine

## 2018-07-29 ENCOUNTER — Telehealth: Payer: Self-pay | Admitting: Internal Medicine

## 2018-07-29 VITALS — BP 124/64 | HR 63 | Temp 97.6°F | Resp 22 | Ht 66.5 in | Wt 129.7 lb

## 2018-07-29 DIAGNOSIS — Z5111 Encounter for antineoplastic chemotherapy: Secondary | ICD-10-CM

## 2018-07-29 DIAGNOSIS — C3412 Malignant neoplasm of upper lobe, left bronchus or lung: Secondary | ICD-10-CM | POA: Diagnosis present

## 2018-07-29 DIAGNOSIS — C3490 Malignant neoplasm of unspecified part of unspecified bronchus or lung: Secondary | ICD-10-CM

## 2018-07-29 DIAGNOSIS — R197 Diarrhea, unspecified: Secondary | ICD-10-CM | POA: Diagnosis not present

## 2018-07-29 DIAGNOSIS — I10 Essential (primary) hypertension: Secondary | ICD-10-CM | POA: Insufficient documentation

## 2018-07-29 DIAGNOSIS — Z85038 Personal history of other malignant neoplasm of large intestine: Secondary | ICD-10-CM | POA: Diagnosis not present

## 2018-07-29 DIAGNOSIS — Z9013 Acquired absence of bilateral breasts and nipples: Secondary | ICD-10-CM | POA: Insufficient documentation

## 2018-07-29 DIAGNOSIS — Z933 Colostomy status: Secondary | ICD-10-CM | POA: Insufficient documentation

## 2018-07-29 DIAGNOSIS — C50911 Malignant neoplasm of unspecified site of right female breast: Secondary | ICD-10-CM

## 2018-07-29 DIAGNOSIS — C50111 Malignant neoplasm of central portion of right female breast: Secondary | ICD-10-CM

## 2018-07-29 DIAGNOSIS — Z170 Estrogen receptor positive status [ER+]: Secondary | ICD-10-CM | POA: Diagnosis not present

## 2018-07-29 DIAGNOSIS — Z79811 Long term (current) use of aromatase inhibitors: Secondary | ICD-10-CM | POA: Diagnosis not present

## 2018-07-29 DIAGNOSIS — R5383 Other fatigue: Secondary | ICD-10-CM | POA: Insufficient documentation

## 2018-07-29 DIAGNOSIS — C3492 Malignant neoplasm of unspecified part of left bronchus or lung: Secondary | ICD-10-CM

## 2018-07-29 DIAGNOSIS — C50912 Malignant neoplasm of unspecified site of left female breast: Secondary | ICD-10-CM | POA: Diagnosis not present

## 2018-07-29 DIAGNOSIS — C50112 Malignant neoplasm of central portion of left female breast: Secondary | ICD-10-CM

## 2018-07-29 DIAGNOSIS — Z8673 Personal history of transient ischemic attack (TIA), and cerebral infarction without residual deficits: Secondary | ICD-10-CM | POA: Diagnosis not present

## 2018-07-29 DIAGNOSIS — Z9049 Acquired absence of other specified parts of digestive tract: Secondary | ICD-10-CM | POA: Diagnosis not present

## 2018-07-29 DIAGNOSIS — Z79899 Other long term (current) drug therapy: Secondary | ICD-10-CM | POA: Insufficient documentation

## 2018-07-29 DIAGNOSIS — C182 Malignant neoplasm of ascending colon: Secondary | ICD-10-CM

## 2018-07-29 DIAGNOSIS — M1990 Unspecified osteoarthritis, unspecified site: Secondary | ICD-10-CM | POA: Insufficient documentation

## 2018-07-29 DIAGNOSIS — Z9221 Personal history of antineoplastic chemotherapy: Secondary | ICD-10-CM | POA: Insufficient documentation

## 2018-07-29 MED ORDER — CLONIDINE HCL 0.1 MG PO TABS
ORAL_TABLET | ORAL | Status: AC
  Filled 2018-07-29: qty 2

## 2018-07-29 MED ORDER — CLONIDINE HCL 0.1 MG PO TABS
0.2000 mg | ORAL_TABLET | Freq: Once | ORAL | Status: AC
  Administered 2018-07-29: 0.2 mg via ORAL

## 2018-07-29 MED ORDER — INTEGRA PLUS PO CAPS: 1.0000 | ORAL_CAPSULE | Freq: Every morning | ORAL | 2 refills | Status: DC

## 2018-07-29 NOTE — Progress Notes (Signed)
[place] [phone]   [phone]  OFFICE PROGRESS NOTE  Eber Hong, MD 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 2) adenocarcinoma of the ascending colon (T2, N0, M0) with MSI high diagnosed in March 2017. 3) history of breast adenocarcinoma.  PRIOR THERAPY:  1) Tarceva 150 mg by mouth daily status post 68 months of treatment. 2) Status post right colectomy with lymph node dissection in May 2017.  CURRENT THERAPY: 1) Tagrisso 80 mg by mouth daily started 11/09/2016.  Status post 18 months of treatment. 2) observation for the history of colon adenocarcinoma. 3) Femara 2.5 mg by mouth daily for history of breast cancer.  INTERVAL HISTORY: Kristin [name] 77 y.o. female returns to the clinic today for follow-up visit accompanied by her husband.  The patient is feeling fine today with no concerning complaints except for mild fatigue.  She is recovering slowly from her recent surgery.  She continues to have colostomy with occasional diarrhea.  She takes half a tablet of Imodium on as-needed basis.  She denied having any chest pain, shortness of breath, cough or hemoptysis.  She denied having any nausea, vomiting or abdominal pain.  She has no recent weight loss or night sweats.  She resumed her treatment with Tagrisso few weeks ago and she is tolerating it well.  The patient is here today for evaluation and repeat blood work.  MEDICAL HISTORY: Past Medical History:  Diagnosis Date  . Allergy   . Anemia   . Anemia of chronic disease 09/21/2016  . Anxiety   . Arthritis    arthritis- left hip  . Blood transfusion without reported diagnosis    transfusion- 3-4 yrs ago -found to be anemic on routine lab check  . Breast cancer ([facility]) 2002   bilateral- bilateral mastectomies done.  . 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 CVA (cerebral infarction) 04/30/2013  . Encounter for antineoplastic chemotherapy 10/28/2016  . Family history of brain cancer   . Family history of breast cancer   . Family history of colon cancer 10/06/2009  . Family history of colon cancer   . Family history of prostate cancer   . GERD (gastroesophageal reflux disease)   . Glaucoma   . History of bilateral mastectomy 05/20/2010  . Hypertension   . Lung cancer ([place]) dx'd 12/2010   last Ct scan "no lung cancer" showing 11'16 CT Chest Epic.  . Malignant neoplasm of ascending colon ([place]) 01/29/2016  . Stroke [facility])    2 yrs ago-no residual  . Tubular adenoma of colon 07/2011   colon polyps ans reoccurence with malignancy found    ALLERGIES:  is allergic to doxycycline and sulfa antibiotics.  MEDICATIONS:  Current Outpatient Medications  Medication Sig Dispense Refill  . alendronate (FOSAMAX) 70 MG tablet Take 70 mg once a week by mouth.    . brimonidine-timolol (COMBIGAN) 0.2-0.5 % ophthalmic solution Place 1 drop into the right eye every 12 (twelve) hours.    . Cholecalciferol (VITAMIN D3) 2000 units capsule Take 2,000 Units daily by mouth.    . clopidogrel (PLAVIX) 75 MG tablet Take 75 mg by mouth daily.    . cycloSPORINE (RESTASIS) 0.05 % ophthalmic emulsion Place 1 drop into both eyes 2 (two) times daily.    Marland Kitchen escitalopram (LEXAPRO) 10 MG tablet  Take 10 mg by mouth daily.     . famotidine (PEPCID) 40 MG tablet Take 40 mg by mouth daily.    . feeding supplement, ENSURE ENLIVE, (ENSURE ENLIVE) LIQD Take 237 mLs by mouth 2 (two) times daily between meals. 237 mL 12  . letrozole (FEMARA) 2.5 MG tablet Take 2.5 mg by mouth daily.  3  . methimazole (TAPAZOLE) 10 MG tablet     . Multiple Vitamins-Iron (MULTIVITAMINS WITH IRON) TABS tablet Take 1 tablet by mouth daily.  0   Current Facility-Administered Medications  Medication Dose Route Frequency Provider Last  Rate Last Dose  . 0.9 %  sodium chloride infusion  500 mL Intravenous Continuous Armbruster, Carlota Raspberry, MD        SURGICAL HISTORY:  Past Surgical History:  Procedure Laterality Date  . APPENDECTOMY    . BOWEL DECOMPRESSION N/A 06/12/2018   Procedure: BOWEL DECOMPRESSION;  Surgeon: Rush Landmark Telford Nab., MD;  Location: [facility];  Service: Gastroenterology;  Laterality: N/A;  . cataracts Bilateral   . COLECTOMY WITH COLOSTOMY CREATION/HARTMANN PROCEDURE  06/16/2018   Procedure: CREATION/HARTMANN COLOSTOMY;  Surgeon: Michael Boston, MD;  Location: [facility];  Service: General;;  . COLONOSCOPY    . COLOSTOMY N/A 06/16/2018   Procedure: POSSIBLE COLOSTOMY;  Surgeon: Michael Boston, MD;  Location: [facility];  Service: General;  Laterality: N/A;  . EYE SURGERY    . FLEXIBLE SIGMOIDOSCOPY N/A 06/12/2018   Procedure: FLEXIBLE SIGMOIDOSCOPY;  Surgeon: Rush Landmark Telford Nab., MD;  Location: [facility];  Service: Gastroenterology;  Laterality: N/A;  . LAPAROSCOPIC SIGMOID COLECTOMY N/A 06/16/2018   Procedure: LAPAROSCOPIC LOW ANTERIOR RESECTION;  Surgeon: Michael Boston, MD;  Location: [facility];  Service: General;  Laterality: N/A;  . LUNG SURGERY     Resection -" not done. Pt . tx with Tarceva  . MASTECTOMY Bilateral   . MEDIASTINOSCOPY N/A 10/02/2016   Procedure: MEDIASTINOSCOPY;  Surgeon: [name], MD;  Location[facility];  Service: Thoracic;  Laterality: N/A;  . RECONSTRUCTION BREAST W/ TRAM FLAP Bilateral    bilaterally  . robotic right hemicolectomy Right 03/06/2016   Dr. Johney Maine  . TEE WITHOUT CARDIOVERSION N/A 06/04/2013   Procedure: TRANSESOPHAGEAL ECHOCARDIOGRAM (TEE);  Surgeon: Jolaine Artist, MD;  Location: [facility] END[facility]COPY;  Service: Cardiovascular;  Laterality: N/A;  . TONSILLECTOMY    . TOTAL HIP ARTHROPLASTY Left 08/07/2016   Procedure: LEFT TOTAL HIP ARTHROPLASTY ANTERIOR APPROACH;  Surgeon: Gaynelle Arabian, MD;  Location: [facility];  Service: Orthopedics;  Laterality: Left;     REVIEW OF SYSTEMS:  A comprehensive review of systems was negative except for: Constitutional: positive for fatigue Gastrointestinal: positive for diarrhea   PHYSICAL EXAMINATION: General appearance: alert, cooperative, fatigued and no distress Head: Normocephalic, without obvious abnormality, atraumatic Neck: no adenopathy, no JVD, supple, symmetrical, trachea midline and thyroid not enlarged, symmetric, no tenderness/mass/nodules Lymph nodes: Cervical, supraclavicular, and axillary nodes normal. Resp: clear to auscultation bilaterally Back: symmetric, no curvature. ROM normal. No CVA tenderness. Cardio: regular rate and rhythm, S1, S2 normal, no murmur, click, rub or gallop GI: soft, non-tender; bowel sounds normal; no masses,  no organomegaly Extremities: extremities normal, atraumatic, no cyanosis or edema  ECOG PERFORMANCE STATUS: 1 - Symptomatic but completely ambulatory  Blood pressure (!) 182/69, pulse 74, temperature 97.6 F (36.4 C), temperature source Oral, resp. rate (!) 22, height 5' 6.5" (1.689 m), weight 129 lb 11.2 oz (58.8 kg), SpO2 100 %.  LABORATORY DATA: Lab Results  Component Value Date   WBC 8.2  06/24/2018   HGB 8.7 (L) 06/24/2018   HCT 26.4 (L) 06/24/2018   MCV 90.1 06/24/2018   PLT 328 06/24/2018      Chemistry      Component Value Date/Time   NA 138 06/23/2018 0412   NA 138 09/26/2017 0942   K 4.6 06/23/2018 0412   K 5.2 No visable hemolysis (H) 09/26/2017 0942   CL 108 06/23/2018 0412   CL 107 04/05/2013 0754   CO2 23 06/23/2018 0412   CO2 26 09/26/2017 0942   BUN 31 (H) 06/23/2018 0412   BUN 16.0 09/26/2017 0942   CREATININE 0.95 06/23/2018 0412   CREATININE 1.27 (H) 05/04/2018 1121   CREATININE 1.2 (H) 09/26/2017 0942      Component Value Date/Time   CALCIUM 8.6 (L) 06/23/2018 0412   CALCIUM 9.9 09/26/2017 0942   ALKPHOS 108 06/22/2018 0405   ALKPHOS 68 09/26/2017 0942   AST 40 06/22/2018 0405   AST 17 05/04/2018 1121   AST 18  09/26/2017 0942   ALT 44 06/22/2018 0405   ALT 15 05/04/2018 1121   ALT 13 09/26/2017 0942   BILITOT 0.1 (L) 06/22/2018 0405   BILITOT 0.3 05/04/2018 1121   BILITOT 0.31 09/26/2017 0942       RADIOGRAPHIC STUDIES: No results found.  ASSESSMENT AND PLAN:  This is a very pleasant 77 years old white female with metastatic non-small cell lung cancer, adenocarcinoma diagnosed in March 2012 with positive EGFR mutation status post 68 months of treatment with Tarceva discontinued secondary to disease progression and development of EGFR T790M resistant mutation. The patient is currently on treatment with Tagrisso 80 mg by mouth daily status post 20 months.  The patient has been tolerating her treatment fairly well with no concerning adverse effects. She recently had a colon resection for small bowel obstruction and there was no evidence of malignancy. She is recovering well from the surgery. I recommended for the patient to continue her current treatment with Tagrisso with the same dose. For the anemia, I recommended for the patient to continue on oral iron tablets and I gave her prescription for Integra +1 capsule p.o. daily. For the history of breast cancer, she will continue on Femara. For the history of colon cancer, she has no evidence for disease recurrence, she will continue on observation for now. I will see her back for follow-up visit in 6 weeks for evaluation with repeat CT scan of the chest, abdomen and pelvis for restaging of her disease. The patient was advised to call immediately if she has any concerning symptoms in the interval. The patient voices understanding of current disease status and treatment options and is in agreement with the current care plan. All questions were answered. The patient knows to call the clinic with any problems, questions or concerns. We can certainly see the patient much sooner if necessary.  Disclaimer: This note was dictated with voice recognition  software. Similar sounding words can inadvertently be transcribed and may not be corrected upon review.

## 2018-07-29 NOTE — Telephone Encounter (Signed)
Appts scheduled avs/calendar printed per 10/9 los °

## 2018-07-30 ENCOUNTER — Other Ambulatory Visit: Payer: Self-pay | Admitting: *Deleted

## 2018-07-30 DIAGNOSIS — Z5111 Encounter for antineoplastic chemotherapy: Secondary | ICD-10-CM

## 2018-07-30 DIAGNOSIS — D638 Anemia in other chronic diseases classified elsewhere: Secondary | ICD-10-CM

## 2018-07-30 DIAGNOSIS — C3412 Malignant neoplasm of upper lobe, left bronchus or lung: Secondary | ICD-10-CM

## 2018-07-30 DIAGNOSIS — C50112 Malignant neoplasm of central portion of left female breast: Secondary | ICD-10-CM

## 2018-07-30 DIAGNOSIS — C182 Malignant neoplasm of ascending colon: Secondary | ICD-10-CM

## 2018-07-30 DIAGNOSIS — C50111 Malignant neoplasm of central portion of right female breast: Secondary | ICD-10-CM

## 2018-07-30 DIAGNOSIS — Z170 Estrogen receptor positive status [ER+]: Secondary | ICD-10-CM

## 2018-07-30 MED ORDER — OSIMERTINIB MESYLATE 80 MG PO TABS: 80.0000 mg | ORAL_TABLET | Freq: Every day | ORAL | 0 refills | Status: DC

## 2018-07-30 NOTE — Progress Notes (Unsigned)
Pt called with request for refill on Tagrisso.

## 2018-07-31 ENCOUNTER — Telehealth: Payer: Self-pay | Admitting: Medical Oncology

## 2018-07-31 NOTE — Telephone Encounter (Signed)
faxed tagrisso refill to [facility]

## 2018-08-03 ENCOUNTER — Telehealth: Payer: Self-pay | Admitting: *Deleted

## 2018-08-03 NOTE — Telephone Encounter (Signed)
Received VM message from patient regarding her [place].  Returned call to patient. Patient states that she is out of her Tagrisso.  Informed her that the prescription was fax'd to [facility] on 07/31/18.. She states she will call them to find out when to expect the delivery. Advised pt to call back if there is a big delay in the shipment of her Tagrisso.  Patient voiced understanding.

## 2018-08-04 ENCOUNTER — Telehealth: Payer: Self-pay

## 2018-08-04 ENCOUNTER — Other Ambulatory Visit: Payer: Self-pay | Admitting: Medical Oncology

## 2018-08-04 DIAGNOSIS — C50112 Malignant neoplasm of central portion of left female breast: Principal | ICD-10-CM

## 2018-08-04 DIAGNOSIS — Z170 Estrogen receptor positive status [ER+]: Principal | ICD-10-CM

## 2018-08-04 DIAGNOSIS — C50111 Malignant neoplasm of central portion of right female breast: Secondary | ICD-10-CM

## 2018-08-04 MED ORDER — LETROZOLE 2.5 MG PO TABS: 2.5000 mg | ORAL_TABLET | Freq: Every day | ORAL | 3 refills | Status: DC

## 2018-08-04 NOTE — Telephone Encounter (Signed)
Oral Oncology Patient Advocate Encounter  Ms. Frutiger called stating that she tried to call in her refill for Tagrisso but held on the phone to long. I called [employer] and requested her refill. [employer] is working on getting this processed and will send to patient as soon as they are done with the filling process.   I called the patient and let her know, she verbalized understanding and appreciation.  [place] Patient [place] Phone [phone] Fax [phone]

## 2018-08-06 ENCOUNTER — Telehealth: Payer: Self-pay | Admitting: Medical Oncology

## 2018-08-06 NOTE — Telephone Encounter (Signed)
Received fax from [facility] and [facility] . Requested information ( nothing specified  in fax) to complete refill request . I called to and the wait time on hold was 90 minutes. I hung up phone.

## 2018-08-10 ENCOUNTER — Ambulatory Visit: Payer: Self-pay | Admitting: Surgery

## 2018-08-10 NOTE — Telephone Encounter (Signed)
Oral Oncology Patient Advocate Encounter  Kristin [name] came into the [facility] today and asked to speak with me. She stated that her [name] had not come to her house yet. I called [employer] and asked them to put the refill in again and they did an expedited request.  I called the patient to let her know and I had to leave her a message.  [place] Patient [place] Phone [phone] Fax [phone]

## 2018-08-11 ENCOUNTER — Telehealth: Payer: Self-pay | Admitting: Pharmacist

## 2018-08-11 ENCOUNTER — Other Ambulatory Visit: Payer: Self-pay | Admitting: *Deleted

## 2018-08-11 DIAGNOSIS — D638 Anemia in other chronic diseases classified elsewhere: Secondary | ICD-10-CM

## 2018-08-11 DIAGNOSIS — Z170 Estrogen receptor positive status [ER+]: Secondary | ICD-10-CM

## 2018-08-11 DIAGNOSIS — C50111 Malignant neoplasm of central portion of right female breast: Secondary | ICD-10-CM

## 2018-08-11 DIAGNOSIS — C3412 Malignant neoplasm of upper lobe, left bronchus or lung: Secondary | ICD-10-CM

## 2018-08-11 DIAGNOSIS — C182 Malignant neoplasm of ascending colon: Secondary | ICD-10-CM

## 2018-08-11 DIAGNOSIS — Z5111 Encounter for antineoplastic chemotherapy: Secondary | ICD-10-CM

## 2018-08-11 DIAGNOSIS — C50112 Malignant neoplasm of central portion of left female breast: Secondary | ICD-10-CM

## 2018-08-11 MED ORDER — OSIMERTINIB MESYLATE 80 MG PO TABS: 80.0000 mg | ORAL_TABLET | Freq: Every day | ORAL | 3 refills | Status: DC

## 2018-08-11 NOTE — Telephone Encounter (Signed)
Oral Oncology Pharmacist Encounter  Received notification from [facility] and [facility] prescription savings program that patient's Tagrisso refill has still not been processed to the pharmacy. This is despite 2 previous attempts to process patient's refill request. Representative stated that refill request has now been sent to the dispensing pharmacy and patient should receive her fill of Tagrisso in the next 3-5 business days.  Representative also stated that there are no remaining refills on patient's current prescription.  Fax cover sheet for [place] [facility] provided to collaborative practice RN on Dr. Worthy Flank desk to print new prescription with refills and faxed to [facility] and [facility] prescription savings program.  I called and updated patient that refill request has not yet been processed by [employer] and that I did request a new refill to be processed today.  Patient expressed understanding and appreciation. Patient knows to call the office with any additional questions or concerns. Patient states that she will reach out to oral oncology clinic if and when she receives her fill of Tagrisso.  Johny Drilling, PharmD, BCPS, BCOP  08/11/2018 4:04 PM Oral Oncology Clinic [phone]

## 2018-08-11 NOTE — Telephone Encounter (Signed)
Faxed Rx for Tagrisso to [employer].

## 2018-08-14 NOTE — Telephone Encounter (Signed)
Oral Oncology Pharmacist Encounter  Patient called this morning to alert the office that she had finally received a bottle of Tagrisso from [facility] prescription savings program. She expressed appreciation for all of the work done here from the office on her behalf. She will let us know if she has issues receiving her November Tagrisso fill.  Johny Drilling, PharmD, BCPS, BCOP  08/14/2018 8:39 AM Oral Oncology Clinic [phone]

## 2018-09-03 ENCOUNTER — Other Ambulatory Visit: Payer: Self-pay | Admitting: Medical Oncology

## 2018-09-03 DIAGNOSIS — Z170 Estrogen receptor positive status [ER+]: Secondary | ICD-10-CM

## 2018-09-03 DIAGNOSIS — C50111 Malignant neoplasm of central portion of right female breast: Secondary | ICD-10-CM

## 2018-09-03 DIAGNOSIS — C3412 Malignant neoplasm of upper lobe, left bronchus or lung: Secondary | ICD-10-CM

## 2018-09-03 DIAGNOSIS — C182 Malignant neoplasm of ascending colon: Secondary | ICD-10-CM

## 2018-09-03 DIAGNOSIS — Z5111 Encounter for antineoplastic chemotherapy: Secondary | ICD-10-CM

## 2018-09-03 DIAGNOSIS — D638 Anemia in other chronic diseases classified elsewhere: Secondary | ICD-10-CM

## 2018-09-03 DIAGNOSIS — C50112 Malignant neoplasm of central portion of left female breast: Secondary | ICD-10-CM

## 2018-09-03 MED ORDER — OSIMERTINIB MESYLATE 80 MG PO TABS: 80.0000 mg | ORAL_TABLET | Freq: Every day | ORAL | 3 refills | Status: DC

## 2018-09-07 ENCOUNTER — Other Ambulatory Visit: Payer: Medicare Other

## 2018-09-09 ENCOUNTER — Ambulatory Visit: Payer: Medicare Other | Admitting: Oncology

## 2018-09-10 ENCOUNTER — Inpatient Hospital Stay: Payer: Medicare Other

## 2018-09-10 ENCOUNTER — Inpatient Hospital Stay: Payer: Medicare Other | Attending: Internal Medicine

## 2018-09-10 ENCOUNTER — Ambulatory Visit (HOSPITAL_COMMUNITY): Admission: RE | Admit: 2018-09-10 | Payer: Medicare Other | Source: Ambulatory Visit

## 2018-09-10 ENCOUNTER — Ambulatory Visit (HOSPITAL_COMMUNITY)
Admission: RE | Admit: 2018-09-10 | Discharge: 2018-09-10 | Disposition: A | Discharge status: Home / Self Care | Payer: Medicare Other | Source: Ambulatory Visit | Attending: Internal Medicine | Admitting: Internal Medicine

## 2018-09-10 ENCOUNTER — Telehealth: Payer: Self-pay | Admitting: Internal Medicine

## 2018-09-10 DIAGNOSIS — Z170 Estrogen receptor positive status [ER+]: Secondary | ICD-10-CM | POA: Diagnosis not present

## 2018-09-10 DIAGNOSIS — Z79899 Other long term (current) drug therapy: Secondary | ICD-10-CM | POA: Diagnosis not present

## 2018-09-10 DIAGNOSIS — Z933 Colostomy status: Secondary | ICD-10-CM | POA: Insufficient documentation

## 2018-09-10 DIAGNOSIS — Z8673 Personal history of transient ischemic attack (TIA), and cerebral infarction without residual deficits: Secondary | ICD-10-CM | POA: Insufficient documentation

## 2018-09-10 DIAGNOSIS — C50912 Malignant neoplasm of unspecified site of left female breast: Secondary | ICD-10-CM | POA: Insufficient documentation

## 2018-09-10 DIAGNOSIS — R5383 Other fatigue: Secondary | ICD-10-CM | POA: Diagnosis not present

## 2018-09-10 DIAGNOSIS — C50911 Malignant neoplasm of unspecified site of right female breast: Secondary | ICD-10-CM | POA: Diagnosis not present

## 2018-09-10 DIAGNOSIS — C3412 Malignant neoplasm of upper lobe, left bronchus or lung: Secondary | ICD-10-CM | POA: Diagnosis present

## 2018-09-10 DIAGNOSIS — Z79811 Long term (current) use of aromatase inhibitors: Secondary | ICD-10-CM | POA: Diagnosis not present

## 2018-09-10 DIAGNOSIS — Z9049 Acquired absence of other specified parts of digestive tract: Secondary | ICD-10-CM | POA: Insufficient documentation

## 2018-09-10 DIAGNOSIS — I10 Essential (primary) hypertension: Secondary | ICD-10-CM | POA: Insufficient documentation

## 2018-09-10 DIAGNOSIS — Z85038 Personal history of other malignant neoplasm of large intestine: Secondary | ICD-10-CM | POA: Insufficient documentation

## 2018-09-10 DIAGNOSIS — C3490 Malignant neoplasm of unspecified part of unspecified bronchus or lung: Secondary | ICD-10-CM

## 2018-09-10 DIAGNOSIS — Z9013 Acquired absence of bilateral breasts and nipples: Secondary | ICD-10-CM | POA: Insufficient documentation

## 2018-09-10 LAB — CBC WITH DIFFERENTIAL (CANCER CENTER ONLY)
Abs Immature Granulocytes: 0 10*3/uL (ref 0.00–0.07)
Basophils Absolute: 0 10*3/uL (ref 0.0–0.1)
Basophils Relative: 1 %
EOS ABS: 0.2 10*3/uL (ref 0.0–0.5)
Eosinophils Relative: 4 %
HCT: 33.4 % — ABNORMAL LOW (ref 36.0–46.0)
Hemoglobin: 10.4 g/dL — ABNORMAL LOW (ref 12.0–15.0)
IMMATURE GRANULOCYTES: 0 %
Lymphocytes Relative: 27 %
Lymphs Abs: 1.3 10*3/uL (ref 0.7–4.0)
MCH: 29.1 pg (ref 26.0–34.0)
MCHC: 31.1 g/dL (ref 30.0–36.0)
MCV: 93.6 fL (ref 80.0–100.0)
MONO ABS: 0.8 10*3/uL (ref 0.1–1.0)
MONOS PCT: 16 %
NEUTROS ABS: 2.6 10*3/uL (ref 1.7–7.7)
NEUTROS PCT: 52 %
PLATELETS: 256 10*3/uL (ref 150–400)
RBC: 3.57 MIL/uL — AB (ref 3.87–5.11)
RDW: 15.2 % (ref 11.5–15.5)
WBC: 5 10*3/uL (ref 4.0–10.5)
nRBC: 0 % (ref 0.0–0.2)

## 2018-09-10 LAB — CMP (CANCER CENTER ONLY)
ALK PHOS: 97 U/L (ref 38–126)
ALT: 15 U/L (ref 0–44)
ANION GAP: 6 (ref 5–15)
AST: 20 U/L (ref 15–41)
Albumin: 3.6 g/dL (ref 3.5–5.0)
BILIRUBIN TOTAL: 0.3 mg/dL (ref 0.3–1.2)
BUN: 18 mg/dL (ref 8–23)
CALCIUM: 10 mg/dL (ref 8.9–10.3)
CO2: 27 mmol/L (ref 22–32)
Chloride: 107 mmol/L (ref 98–111)
Creatinine: 1.07 mg/dL — ABNORMAL HIGH (ref 0.44–1.00)
GFR, EST NON AFRICAN AMERICAN: 48 mL/min — AB (ref >60)
GFR, Est AFR Am: 56 mL/min — ABNORMAL LOW (ref >60)
GLUCOSE: 100 mg/dL — AB (ref 70–99)
POTASSIUM: 5.2 mmol/L — AB (ref 3.5–5.1)
Sodium: 140 mmol/L (ref 135–145)
TOTAL PROTEIN: 6.6 g/dL (ref 6.5–8.1)

## 2018-09-10 MED ORDER — IOHEXOL 300 MG/ML  SOLN
75.0000 mL | Freq: Once | INTRAMUSCULAR | Status: AC | PRN
  Administered 2018-09-10: 75 mL via INTRAVENOUS

## 2018-09-10 MED ORDER — SODIUM CHLORIDE (PF) 0.9 % IJ SOLN
INTRAMUSCULAR | Status: AC
  Filled 2018-09-10: qty 50

## 2018-09-10 NOTE — Telephone Encounter (Signed)
Per Diane ok to change lab appointment

## 2018-09-14 ENCOUNTER — Encounter: Payer: Self-pay | Admitting: Internal Medicine

## 2018-09-14 ENCOUNTER — Telehealth: Payer: Self-pay | Admitting: Medical Oncology

## 2018-09-14 ENCOUNTER — Telehealth: Payer: Self-pay

## 2018-09-14 ENCOUNTER — Inpatient Hospital Stay (HOSPITAL_BASED_OUTPATIENT_CLINIC_OR_DEPARTMENT_OTHER): Payer: Medicare Other | Admitting: Internal Medicine

## 2018-09-14 ENCOUNTER — Other Ambulatory Visit: Payer: Self-pay | Admitting: Medical Oncology

## 2018-09-14 VITALS — BP 149/70 | HR 68 | Temp 98.1°F | Resp 17 | Ht 66.5 in | Wt 133.3 lb

## 2018-09-14 DIAGNOSIS — D638 Anemia in other chronic diseases classified elsewhere: Secondary | ICD-10-CM

## 2018-09-14 DIAGNOSIS — Z8673 Personal history of transient ischemic attack (TIA), and cerebral infarction without residual deficits: Secondary | ICD-10-CM

## 2018-09-14 DIAGNOSIS — Z5111 Encounter for antineoplastic chemotherapy: Secondary | ICD-10-CM

## 2018-09-14 DIAGNOSIS — Z9013 Acquired absence of bilateral breasts and nipples: Secondary | ICD-10-CM

## 2018-09-14 DIAGNOSIS — Z9049 Acquired absence of other specified parts of digestive tract: Secondary | ICD-10-CM

## 2018-09-14 DIAGNOSIS — C3412 Malignant neoplasm of upper lobe, left bronchus or lung: Secondary | ICD-10-CM

## 2018-09-14 DIAGNOSIS — C182 Malignant neoplasm of ascending colon: Secondary | ICD-10-CM

## 2018-09-14 DIAGNOSIS — C50111 Malignant neoplasm of central portion of right female breast: Secondary | ICD-10-CM

## 2018-09-14 DIAGNOSIS — Z85038 Personal history of other malignant neoplasm of large intestine: Secondary | ICD-10-CM

## 2018-09-14 DIAGNOSIS — Z79811 Long term (current) use of aromatase inhibitors: Secondary | ICD-10-CM

## 2018-09-14 DIAGNOSIS — R5383 Other fatigue: Secondary | ICD-10-CM

## 2018-09-14 DIAGNOSIS — I10 Essential (primary) hypertension: Secondary | ICD-10-CM

## 2018-09-14 DIAGNOSIS — C50912 Malignant neoplasm of unspecified site of left female breast: Secondary | ICD-10-CM

## 2018-09-14 DIAGNOSIS — C50112 Malignant neoplasm of central portion of left female breast: Secondary | ICD-10-CM

## 2018-09-14 DIAGNOSIS — C50911 Malignant neoplasm of unspecified site of right female breast: Secondary | ICD-10-CM | POA: Diagnosis not present

## 2018-09-14 DIAGNOSIS — Z170 Estrogen receptor positive status [ER+]: Secondary | ICD-10-CM | POA: Diagnosis not present

## 2018-09-14 DIAGNOSIS — Z933 Colostomy status: Secondary | ICD-10-CM

## 2018-09-14 DIAGNOSIS — Z79899 Other long term (current) drug therapy: Secondary | ICD-10-CM

## 2018-09-14 MED ORDER — OSIMERTINIB MESYLATE 80 MG PO TABS: 80.0000 mg | ORAL_TABLET | Freq: Every day | ORAL | 3 refills | Status: DC

## 2018-09-14 NOTE — Telephone Encounter (Signed)
Faxed refill Tafrisso.

## 2018-09-14 NOTE — Progress Notes (Signed)
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 2) adenocarcinoma of the ascending colon (T2, N0, M0) with MSI high diagnosed in March 2017. 3) history of breast adenocarcinoma.  PRIOR THERAPY:  1) Tarceva 150 mg by mouth daily status post 68 months of treatment. 2) Status post right colectomy with lymph node dissection in May 2017.  CURRENT THERAPY: 1) Tagrisso 80 mg by mouth daily started 11/09/2016.  Status post 22 months of treatment. 2) observation for the history of colon adenocarcinoma. 3) Femara 2.5 mg by mouth daily for history of breast cancer.  INTERVAL HISTORY: Kristin [name] 77 y.o. female returns to the clinic today for follow-up visit accompanied by her husband.  The patient is feeling fine today with no specific complaints except for fatigue and shortness of breath with exertion.  She denied having any chest pain, cough or hemoptysis.  She denied having any fever or chills.  She has no nausea, vomiting, diarrhea or constipation.  She has no significant weight loss or night sweats.  She continues to tolerate her treatment with Tagrisso fairly well.  The patient is here today for evaluation with repeat CT scan of the chest, abdomen and pelvis for restaging of her disease.  MEDICAL HISTORY: Past Medical History:  Diagnosis Date  . Allergy   . Anemia   . Anemia of chronic disease 09/21/2016  . Anxiety   . Arthritis    arthritis- left hip  . Blood transfusion without reported diagnosis    transfusion- 3-4 yrs ago -found to be anemic on routine lab check  . Breast cancer ([place]) 2002   bilateral- bilateral mastectomies done.  . 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 CVA (cerebral infarction) 04/30/2013  . Encounter for antineoplastic chemotherapy 10/28/2016  . Family history of brain cancer   . Family history of breast cancer   . Family history of colon cancer 10/06/2009  . Family history of colon cancer   . Family history of prostate cancer   . GERD (gastroesophageal reflux disease)   . Glaucoma   . History of bilateral mastectomy 05/20/2010  . Hypertension   . Lung cancer ([place]) dx'd 12/2010   last Ct scan "no lung cancer" showing 11'16 CT Chest Epic.  . Malignant neoplasm of ascending colon ([place]) 01/29/2016  . Stroke [facility])    2 yrs ago-no residual  . Tubular adenoma of colon 07/2011   colon polyps ans reoccurence with malignancy found    ALLERGIES:  is allergic to doxycycline and sulfa antibiotics.  MEDICATIONS:  Current Outpatient Medications  Medication Sig Dispense Refill  . alendronate (FOSAMAX) 70 MG tablet Take 70 mg once a week by mouth.    . brimonidine-timolol (COMBIGAN) 0.2-0.5 % ophthalmic solution Place 1 drop into the right eye every 12 (twelve) hours.    . Cholecalciferol (VITAMIN D3) 2000 units capsule Take 2,000 Units daily by mouth.    . clopidogrel (PLAVIX) 75 MG tablet Take 75 mg by mouth daily.    . cycloSPORINE (RESTASIS) 0.05 % ophthalmic emulsion Place 1 drop into both eyes 2 (two) times daily.    Marland Kitchen escitalopram (LEXAPRO) 10 MG tablet Take 10 mg by mouth daily.     . famotidine (PEPCID)  40 MG tablet Take 40 mg by mouth daily.    . feeding supplement, ENSURE ENLIVE, (ENSURE ENLIVE) LIQD Take 237 mLs by mouth 2 (two) times daily between meals. 237 mL 12  . FeFum-FePoly-FA-B Cmp-C-Biot (INTEGRA PLUS) CAPS Take 1 capsule by mouth every morning. 30 capsule 2  . letrozole (FEMARA) 2.5 MG tablet Take 1 tablet (2.5 mg total) by mouth daily. 30 tablet 3  . methimazole (TAPAZOLE) 10 MG tablet     . Multiple Vitamins-Iron (MULTIVITAMINS WITH IRON) TABS tablet Take 1 tablet by mouth daily.  0  . osimertinib mesylate  (TAGRISSO) 80 MG tablet Take 1 tablet (80 mg total) by mouth daily. 30 tablet 3   Current Facility-Administered Medications  Medication Dose Route Frequency Provider Last Rate Last Dose  . 0.9 %  sodium chloride infusion  500 mL Intravenous Continuous Armbruster, Carlota Raspberry, MD        SURGICAL HISTORY:  Past Surgical History:  Procedure Laterality Date  . APPENDECTOMY    . BOWEL DECOMPRESSION N/A 06/12/2018   Procedure: BOWEL DECOMPRESSION;  Surgeon: Rush Landmark Telford Nab., MD;  Location: [facility];  Service: Gastroenterology;  Laterality: N/A;  . cataracts Bilateral   . COLECTOMY WITH COLOSTOMY CREATION/HARTMANN PROCEDURE  06/16/2018   Procedure: CREATION/HARTMANN COLOSTOMY;  Surgeon: Michael Boston, MD;  Location: [facility];  Service: General;;  . COLONOSCOPY    . COLOSTOMY N/A 06/16/2018   Procedure: POSSIBLE COLOSTOMY;  Surgeon: Michael Boston, MD;  Location: [facility];  Service: General;  Laterality: N/A;  . EYE SURGERY    . FLEXIBLE SIGMOIDOSCOPY N/A 06/12/2018   Procedure: FLEXIBLE SIGMOIDOSCOPY;  Surgeon: Rush Landmark Telford Nab., MD;  Location: [facility];  Service: Gastroenterology;  Laterality: N/A;  . LAPAROSCOPIC SIGMOID COLECTOMY N/A 06/16/2018   Procedure: LAPAROSCOPIC LOW ANTERIOR RESECTION;  Surgeon: Michael Boston, MD;  Location: [facility];  Service: General;  Laterality: N/A;  . LUNG SURGERY     Resection -" not done. Pt . tx with Tarceva  . MASTECTOMY Bilateral   . MEDIASTINOSCOPY N/A 10/02/2016   Procedure: MEDIASTINOSCOPY;  Surgeon: [name], MD;  Location: [facility] OR;  Service: Thoracic;  Laterality: N/A;  . RECONSTRUCTION BREAST W/ TRAM FLAP Bilateral    bilaterally  . robotic right hemicolectomy Right 03/06/2016   Dr. Johney Maine  . TEE WITHOUT CARDIOVERSION N/A 06/04/2013   Procedure: TRANSESOPHAGEAL ECHOCARDIOGRAM (TEE);  Surgeon: Jolaine Artist, MD;  Location: [facility];  Service: Cardiovascular;  Laterality: N/A;  . TONSILLECTOMY    . TOTAL HIP ARTHROPLASTY  Left 08/07/2016   Procedure: LEFT TOTAL HIP ARTHROPLASTY ANTERIOR APPROACH;  Surgeon: Gaynelle Arabian, MD;  Location: [facility];  Service: Orthopedics;  Laterality: Left;    REVIEW OF SYSTEMS:  Constitutional: positive for fatigue Eyes: negative Ears, nose, mouth, throat, and face: negative Respiratory: positive for dyspnea on exertion Cardiovascular: negative Gastrointestinal: negative Genitourinary:negative Integument/breast: negative Hematologic/lymphatic: negative Musculoskeletal:negative Neurological: negative Behavioral/Psych: negative Endocrine: negative Allergic/Immunologic: negative   PHYSICAL EXAMINATION: General appearance: alert, cooperative, fatigued and no distress Head: Normocephalic, without obvious abnormality, atraumatic Neck: no adenopathy, no JVD, supple, symmetrical, trachea midline and thyroid not enlarged, symmetric, no tenderness/mass/nodules Lymph nodes: Cervical, supraclavicular, and axillary nodes normal. Resp: clear to auscultation bilaterally Back: symmetric, no curvature. ROM normal. No CVA tenderness. Cardio: regular rate and rhythm, S1, S2 normal, no murmur, click, rub or gallop GI: soft, non-tender; bowel sounds normal; no masses,  no organomegaly Extremities: extremities normal, atraumatic, no cyanosis or edema Neurologic: Alert and oriented X 3, normal strength and  tone. Normal symmetric reflexes. Normal coordination and gait  ECOG PERFORMANCE STATUS: 1 - Symptomatic but completely ambulatory  Blood pressure (!) 149/70, pulse 68, temperature 98.1 F (36.7 C), temperature source Oral, resp. rate 17, height 5' 6.5" (1.689 m), weight 133 lb 4.8 oz (60.5 kg), SpO2 100 %.  LABORATORY DATA: Lab Results  Component Value Date   WBC 5.0 09/10/2018   HGB 10.4 (L) 09/10/2018   HCT 33.4 (L) 09/10/2018   MCV 93.6 09/10/2018   PLT 256 09/10/2018      Chemistry      Component Value Date/Time   NA 140 09/10/2018 1331   NA 138 09/26/2017 0942   K 5.2 (H)  09/10/2018 1331   K 5.2 No visable hemolysis (H) 09/26/2017 0942   CL 107 09/10/2018 1331   CL 107 04/05/2013 0754   CO2 27 09/10/2018 1331   CO2 26 09/26/2017 0942   BUN 18 09/10/2018 1331   BUN 16.0 09/26/2017 0942   CREATININE 1.07 (H) 09/10/2018 1331   CREATININE 1.2 (H) 09/26/2017 0942      Component Value Date/Time   CALCIUM 10.0 09/10/2018 1331   CALCIUM 9.9 09/26/2017 0942   ALKPHOS 97 09/10/2018 1331   ALKPHOS 68 09/26/2017 0942   AST 20 09/10/2018 1331   AST 18 09/26/2017 0942   ALT 15 09/10/2018 1331   ALT 13 09/26/2017 0942   BILITOT 0.3 09/10/2018 1331   BILITOT 0.31 09/26/2017 0942       RADIOGRAPHIC STUDIES: Ct Chest W Contrast  Result Date: 09/10/2018 CLINICAL DATA:  Patient with history of lung cancer diagnosed in 2013. Colon cancer in 2017 status post partial colectomy. History of breast cancer. EXAM: CT CHEST, ABDOMEN, AND PELVIS WITH CONTRAST TECHNIQUE: Multidetector CT imaging of the chest, abdomen and pelvis was performed following the standard protocol during bolus administration of intravenous contrast. CONTRAST:  64m OMNIPAQUE IOHEXOL 300 MG/ML  SOLN COMPARISON:  CT CAP 05/04/2018 FINDINGS: CT CHEST FINDINGS Cardiovascular: Normal heart size. Trace fluid superior pericardial recess. Aortic and coronary arterial vascular calcifications. Mediastinum/Nodes: No enlarged axillary, mediastinal or hilar lymphadenopathy. Moderate-sized hiatal hernia. Lungs/Pleura: Central airways are patent. Dependent atelectasis/scarring within the bilateral lower lobes. Stable postsurgical changes wedge resection left upper lobe. No pleural effusion or pneumothorax. Musculoskeletal: Thoracic spine degenerative changes. Stable focal sclerosis left lateral third rib. CT ABDOMEN PELVIS FINDINGS Hepatobiliary: Liver is normal in size and contour. Stable hepatic cyst. Gallbladder is unremarkable. No intrahepatic or extrahepatic biliary ductal dilatation. Pancreas: Unremarkable Spleen:  Unremarkable Adrenals/Urinary Tract: Normal adrenal glands. Kidneys enhance symmetrically with contrast. Stable subcentimeter low-attenuation lesions right kidney, too small to characterize. No hydronephrosis. Urinary bladder is unremarkable. Stomach/Bowel: Left anterior lower abdominal wall colostomy. No evidence for small bowel obstruction. Stable postsurgical changes compatible with right hemicolectomy. Stable appearing ileocolonic anastomosis. Vascular/Lymphatic: Normal caliber abdominal aorta. Peripheral calcified atherosclerotic plaque. No retroperitoneal lymphadenopathy. Reproductive: Calcified fibroids within the uterus. Other: None. Musculoskeletal: Lumbar spine degenerative changes. Left hip arthroplasty. No aggressive or acute appearing osseous lesions. IMPRESSION: 1. No evidence for metastatic disease in the chest, abdomen or pelvis. Electronically Signed   By: DLovey NewcomerM.D.   On: 09/10/2018 15:49   Ct Abdomen Pelvis W Contrast  Result Date: 09/10/2018 CLINICAL DATA:  Patient with history of lung cancer diagnosed in 2013. Colon cancer in 2017 status post partial colectomy. History of breast cancer. EXAM: CT CHEST, ABDOMEN, AND PELVIS WITH CONTRAST TECHNIQUE: Multidetector CT imaging of the chest, abdomen and pelvis was performed following the standard  protocol during bolus administration of intravenous contrast. CONTRAST:  75mL OMNIPAQUE IOHEXOL 300 MG/ML  SOLN COMPARISON:  CT CAP 05/04/2018 FINDINGS: CT CHEST FINDINGS Cardiovascular: Normal heart size. Trace fluid superior pericardial recess. Aortic and coronary arterial vascular calcifications. Mediastinum/Nodes: No enlarged axillary, mediastinal or hilar lymphadenopathy. Moderate-sized hiatal hernia. Lungs/Pleura: Central airways are patent. Dependent atelectasis/scarring within the bilateral lower lobes. Stable postsurgical changes wedge resection left upper lobe. No pleural effusion or pneumothorax. Musculoskeletal: Thoracic spine  degenerative changes. Stable focal sclerosis left lateral third rib. CT ABDOMEN PELVIS FINDINGS Hepatobiliary: Liver is normal in size and contour. Stable hepatic cyst. Gallbladder is unremarkable. No intrahepatic or extrahepatic biliary ductal dilatation. Pancreas: Unremarkable Spleen: Unremarkable Adrenals/Urinary Tract: Normal adrenal glands. Kidneys enhance symmetrically with contrast. Stable subcentimeter low-attenuation lesions right kidney, too small to characterize. No hydronephrosis. Urinary bladder is unremarkable. Stomach/Bowel: Left anterior lower abdominal wall colostomy. No evidence for small bowel obstruction. Stable postsurgical changes compatible with right hemicolectomy. Stable appearing ileocolonic anastomosis. Vascular/Lymphatic: Normal caliber abdominal aorta. Peripheral calcified atherosclerotic plaque. No retroperitoneal lymphadenopathy. Reproductive: Calcified fibroids within the uterus. Other: None. Musculoskeletal: Lumbar spine degenerative changes. Left hip arthroplasty. No aggressive or acute appearing osseous lesions. IMPRESSION: 1. No evidence for metastatic disease in the chest, abdomen or pelvis. Electronically Signed   By: Drew  Davis M.D.   On: 09/10/2018 15:49    ASSESSMENT AND PLAN:  This is a very pleasant 77 years old white female with metastatic non-small cell lung cancer, adenocarcinoma diagnosed in March 2012 with positive EGFR mutation status post 68 months of treatment with Tarceva discontinued secondary to disease progression and development of EGFR T790M resistant mutation. The patient is currently on treatment with Tagrisso 80 mg by mouth daily status post 22 months.  The patient continues to tolerate her treatment well with no concerning adverse effects. She had repeat CT scan of the chest, abdomen and pelvis performed recently. I personally and independently reviewed the scan results and discussed this with the patient and her husband today. Her scan showed no  concerning findings for disease recurrence or progression. I recommended for her to continue her current treatment with Tagrisso with the same dose. I will see her back for follow-up visit in 2 months for evaluation with repeat blood work. For the anemia, I recommended for the patient to continue on oral iron tablets and I gave her prescription for Integra +1 capsule p.o. daily. For the history of breast cancer, she will continue on Femara. For the history of colon cancer, she has no evidence for disease recurrence, she will continue on observation for now. The patient was advised to call immediately if she has any concerning symptoms in the interval. The patient voices understanding of current disease status and treatment options and is in agreement with the current care plan. All questions were answered. The patient knows to call the clinic with any problems, questions or concerns. We can certainly see the patient much sooner if necessary.  Disclaimer: This note was dictated with voice recognition software. Similar sounding words can inadvertently be transcribed and may not be corrected upon review.       

## 2018-09-14 NOTE — Telephone Encounter (Signed)
Printed avs and calender of upcoming appointment. Per 11/25 los

## 2018-09-21 ENCOUNTER — Encounter: Payer: Self-pay | Admitting: Cardiovascular Disease

## 2018-09-21 ENCOUNTER — Ambulatory Visit (INDEPENDENT_AMBULATORY_CARE_PROVIDER_SITE_OTHER): Payer: Medicare Other | Admitting: Cardiovascular Disease

## 2018-09-21 VITALS — BP 153/67 | HR 57 | Ht 66.0 in | Wt 137.8 lb

## 2018-09-21 DIAGNOSIS — Z01818 Encounter for other preprocedural examination: Secondary | ICD-10-CM

## 2018-09-21 DIAGNOSIS — Z8673 Personal history of transient ischemic attack (TIA), and cerebral infarction without residual deficits: Secondary | ICD-10-CM | POA: Diagnosis not present

## 2018-09-21 DIAGNOSIS — I10 Essential (primary) hypertension: Secondary | ICD-10-CM | POA: Diagnosis not present

## 2018-09-21 NOTE — Progress Notes (Signed)
SUBJECTIVE: The patient presents for preoperative for stratification.  She underwent a Hartman's procedure for rectosigmoid obstruction due to diverticulitis in August 2019 and has peristomal ulcers. Additional medical history includes breast cancer, metastatic non-small cell adenocarcinoma of the lung, and adenocarcinoma of the ascending colon.  Echocardiogram on 02/01/2016 demonstrated normal left ventricular systolic function, LVEF 60 to 65%, mild basal septal hypertrophy, and grade 1 diastolic dysfunction.  She has no known coronary artery disease.  ECG performed in the office today which I ordered and personally interpreted demonstrates sinus bradycardia, 55 bpm, with no ischemic ST segment or T-wave abnormalities.  The patient denies any symptoms of chest pain, palpitations, shortness of breath, lightheadedness, dizziness, leg swelling, orthopnea, PND, and syncope.  Blood pressure is elevated today in our office but she tells me that systolic readings are normally in the 110 range at her PCPs office.  She plans to undergo a reverse ostomy in either late January or early February 2020.    Review of Systems: As per "subjective", otherwise negative.  Allergies  Allergen Reactions  . Doxycycline Nausea Only    Weak, stomach  . Sulfa Antibiotics Rash    Current Outpatient Medications  Medication Sig Dispense Refill  . alendronate (FOSAMAX) 70 MG tablet Take 70 mg once a week by mouth.    . brimonidine-timolol (COMBIGAN) 0.2-0.5 % ophthalmic solution Place 1 drop into the right eye every 12 (twelve) hours.    . Cholecalciferol (VITAMIN D3) 2000 units capsule Take 2,000 Units daily by mouth.    . clopidogrel (PLAVIX) 75 MG tablet Take 75 mg by mouth daily.    . cycloSPORINE (RESTASIS) 0.05 % ophthalmic emulsion Place 1 drop into both eyes 2 (two) times daily.    Marland Kitchen escitalopram (LEXAPRO) 10 MG tablet Take 10 mg by mouth daily.     . famotidine (PEPCID) 40 MG tablet Take 40 mg  by mouth daily.    . feeding supplement, ENSURE ENLIVE, (ENSURE ENLIVE) LIQD Take 237 mLs by mouth 2 (two) times daily between meals. 237 mL 12  . FeFum-FePoly-FA-B Cmp-C-Biot (INTEGRA PLUS) CAPS Take 1 capsule by mouth every morning. 30 capsule 2  . letrozole (FEMARA) 2.5 MG tablet Take 1 tablet (2.5 mg total) by mouth daily. 30 tablet 3  . methimazole (TAPAZOLE) 10 MG tablet     . Multiple Vitamins-Iron (MULTIVITAMINS WITH IRON) TABS tablet Take 1 tablet by mouth daily.  0  . osimertinib mesylate (TAGRISSO) 80 MG tablet Take 1 tablet (80 mg total) by mouth daily. 30 tablet 3   Current Facility-Administered Medications  Medication Dose Route Frequency Provider Last Rate Last Dose  . 0.9 %  sodium chloride infusion  500 mL Intravenous Continuous Armbruster, Carlota Raspberry, MD        Past Medical History:  Diagnosis Date  . Allergy   . Anemia   . Anemia of chronic disease 09/21/2016  . Anxiety   . Arthritis    arthritis- left hip  . Blood transfusion without reported diagnosis    transfusion- 3-4 yrs ago -found to be anemic on routine lab check  . Breast cancer ([place]) 2002   bilateral- bilateral mastectomies done.  . 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 CVA (cerebral infarction) 04/30/2013  . Encounter for antineoplastic chemotherapy 10/28/2016  . Family history of brain cancer   . Family history of breast cancer   . Family history of colon cancer 10/06/2009  . Family history of colon cancer   . Family history  of prostate cancer   . GERD (gastroesophageal reflux disease)   . Glaucoma   . History of bilateral mastectomy 05/20/2010  . Hypertension   . Lung cancer ([place]) dx'd 12/2010   last Ct scan "no lung cancer" showing 11'16 CT Chest Epic.  . Malignant neoplasm of ascending colon ([place]) 01/29/2016  . Stroke [facility])    2 yrs ago-no residual  . Tubular adenoma of colon 07/2011   colon polyps ans reoccurence with malignancy found    Past Surgical History:  Procedure Laterality Date  .  APPENDECTOMY    . BOWEL DECOMPRESSION N/A 06/12/2018   Procedure: BOWEL DECOMPRESSION;  Surgeon: Rush Landmark Telford Nab., MD;  Location: [facility];  Service: Gastroenterology;  Laterality: N/A;  . cataracts Bilateral   . COLECTOMY WITH COLOSTOMY CREATION/HARTMANN PROCEDURE  06/16/2018   Procedure: CREATION/HARTMANN COLOSTOMY;  Surgeon: Michael Boston, MD;  Location: [facility];  Service: General;;  . COLONOSCOPY    . COLOSTOMY N/A 06/16/2018   Procedure: POSSIBLE COLOSTOMY;  Surgeon: Michael Boston, MD;  Location: [facility];  Service: General;  Laterality: N/A;  . EYE SURGERY    . FLEXIBLE SIGMOIDOSCOPY N/A 06/12/2018   Procedure: FLEXIBLE SIGMOIDOSCOPY;  Surgeon: Rush Landmark Telford Nab., MD;  Location: [facility];  Service: Gastroenterology;  Laterality: N/A;  . LAPAROSCOPIC SIGMOID COLECTOMY N/A 06/16/2018   Procedure: LAPAROSCOPIC LOW ANTERIOR RESECTION;  Surgeon: Michael Boston, MD;  [facility]: [facility];  Service: General;  Laterality: N/A;  . LUNG SURGERY     Resection -" not done. Pt . tx with Tarceva  . MASTECTOMY Bilateral   . MEDIASTINOSCOPY N/A 10/02/2016   Procedure: MEDIASTINOSCOPY;  Surgeon: [name], MD;  Location: [facility];  Service: Thoracic;  Laterality: N/A;  . RECONSTRUCTION BREAST W/ TRAM FLAP Bilateral    bilaterally  . robotic right hemicolectomy Right 03/06/2016   Dr. Johney Maine  . TEE WITHOUT CARDIOVERSION N/A 06/04/2013   Procedure: TRANSESOPHAGEAL ECHOCARDIOGRAM (TEE);  Surgeon: Jolaine Artist, MD;  Location: [facility] ENDOSCOPY;  Service: Cardiovascular;  Laterality: N/A;  . TONSILLECTOMY    . TOTAL HIP ARTHROPLASTY Left 08/07/2016   Procedure: LEFT TOTAL HIP ARTHROPLASTY ANTERIOR APPROACH;  Surgeon: Gaynelle Arabian, MD;  Location: [facility];  Service: Orthopedics;  Laterality: Left;    Social History   Socioeconomic History  . Marital status: Married    Spouse name: Not on file  . Number of children: 2  . Years of education: college  . Highest education level: Not on  file  Occupational History  . Occupation: Retired    Fish farm manager: RETIRED  Social Needs  . Financial resource strain: Not on file  . Food insecurity:    Worry: Not on file    Inability: Not on file  . Transportation needs:    Medical: Not on file    Non-medical: Not on file  Tobacco Use  . Smoking status: Never Smoker  . Smokeless tobacco: Never Used  Substance and Sexual Activity  . Alcohol use: Yes    Alcohol/week: 0.0 standard drinks    Comment: ocassional beer  . Drug use: No  . Sexual activity: Not Currently  Lifestyle  . Physical activity:    Days per week: Not on file    Minutes per session: Not on file  . Stress: Not on file  Relationships  . Social connections:    Talks on phone: Not on file    Gets together: Not on file    Attends religious service: Not on file    Active  member of club or organization: Not on file    Attends meetings of clubs or organizations: Not on file    Relationship status: Not on file  . Intimate partner violence:    Fear of current or ex partner: Not on file    Emotionally abused: Not on file    Physically abused: Not on file    Forced sexual activity: Not on file  Other Topics Concern  . Not on file  Social History Narrative   Patient lives at home with her husband. Patient drinks caffinated  Drinks daily.     Vitals:   09/21/18 1003  BP: (!) 153/67  Pulse: (!) 57  Weight: 137 lb 12.8 oz (62.5 kg)  Height: 5\' 6"  (1.676 m)    Wt Readings from Last 3 Encounters:  09/21/18 137 lb 12.8 oz (62.5 kg)  09/14/18 133 lb 4.8 oz (60.5 kg)  07/29/18 129 lb 11.2 oz (58.8 kg)     PHYSICAL EXAM General: NAD HEENT: Normal. Neck: No JVD, no thyromegaly. Lungs: Clear to auscultation bilaterally with normal respiratory effort. CV: Regular rate and rhythm, normal S1/S2, no S3/S4, no murmur. No pretibial or periankle edema.  No carotid bruit.   Abdomen: Soft, nontender, no distention.  Neurologic: Alert and oriented.  Psych: Normal  affect. Skin: Normal. Musculoskeletal: No gross deformities.    ECG: Reviewed above under Subjective   Labs: Lab Results  Component Value Date/Time   K 5.2 (H) 09/10/2018 01:31 PM   K 5.2 No visable hemolysis (H) 09/26/2017 09:42 AM   BUN 18 09/10/2018 01:31 PM   BUN 16.0 09/26/2017 09:42 AM   CREATININE 1.07 (H) 09/10/2018 01:31 PM   CREATININE 1.2 (H) 09/26/2017 09:42 AM   ALT 15 09/10/2018 01:31 PM   ALT 13 09/26/2017 09:42 AM   HGB 10.4 (L) 09/10/2018 01:31 PM   HGB 10.5 (L) 09/26/2017 09:42 AM     Lipids: Lab Results  Component Value Date/Time   LDLCALC 97 04/30/2013 04:30 AM   CHOL 202 (H) 04/30/2013 04:30 AM   TRIG 191 (H) 06/22/2018 04:05 AM   HDL 93 04/30/2013 04:30 AM       ASSESSMENT AND PLAN:  1.  Preoperative risk stratification: No history of coronary disease.  Unremarkable ECG as noted above.  Symptomatically stable.  Normal left ventricular systolic function by echocardiogram in April 2017.  No indication for preoperative cardiac testing at this time.  She can proceed with surgery with an acceptable level of risk.  2.  History of CVA: She takes Plavix.  3.  Hypertension: Blood pressure is elevated.  She is no longer on antihypertensive therapy.  Blood pressures are typically normal at her PCPs office.  This will need further monitoring by her PCP.   Disposition: Follow up prn   Kate Sable, M.D., F.A.C.C.

## 2018-09-21 NOTE — Patient Instructions (Addendum)
Medication Instructions:  Continue all current medications.  Labwork: none  Testing/Procedures: none  Follow-Up: As needed.    Any Other Special Instructions Will Be Listed Below (If Applicable).  If you need a refill on your cardiac medications before your next appointment, please call your pharmacy.  

## 2018-11-09 ENCOUNTER — Encounter: Payer: Self-pay | Admitting: Internal Medicine

## 2018-11-09 ENCOUNTER — Inpatient Hospital Stay (HOSPITAL_BASED_OUTPATIENT_CLINIC_OR_DEPARTMENT_OTHER): Payer: Medicare Other | Admitting: Internal Medicine

## 2018-11-09 ENCOUNTER — Inpatient Hospital Stay: Payer: Medicare Other | Attending: Internal Medicine

## 2018-11-09 ENCOUNTER — Telehealth: Payer: Self-pay | Admitting: Internal Medicine

## 2018-11-09 VITALS — BP 180/63 | HR 71 | Temp 98.0°F | Resp 18 | Ht 66.0 in | Wt 133.3 lb

## 2018-11-09 DIAGNOSIS — Z79811 Long term (current) use of aromatase inhibitors: Secondary | ICD-10-CM

## 2018-11-09 DIAGNOSIS — C50912 Malignant neoplasm of unspecified site of left female breast: Secondary | ICD-10-CM | POA: Insufficient documentation

## 2018-11-09 DIAGNOSIS — Z8673 Personal history of transient ischemic attack (TIA), and cerebral infarction without residual deficits: Secondary | ICD-10-CM

## 2018-11-09 DIAGNOSIS — Z9049 Acquired absence of other specified parts of digestive tract: Secondary | ICD-10-CM | POA: Insufficient documentation

## 2018-11-09 DIAGNOSIS — C182 Malignant neoplasm of ascending colon: Secondary | ICD-10-CM

## 2018-11-09 DIAGNOSIS — Z8042 Family history of malignant neoplasm of prostate: Secondary | ICD-10-CM | POA: Insufficient documentation

## 2018-11-09 DIAGNOSIS — Z85038 Personal history of other malignant neoplasm of large intestine: Secondary | ICD-10-CM | POA: Diagnosis not present

## 2018-11-09 DIAGNOSIS — Z800 Family history of malignant neoplasm of digestive organs: Secondary | ICD-10-CM | POA: Diagnosis not present

## 2018-11-09 DIAGNOSIS — Z5111 Encounter for antineoplastic chemotherapy: Secondary | ICD-10-CM

## 2018-11-09 DIAGNOSIS — C50911 Malignant neoplasm of unspecified site of right female breast: Secondary | ICD-10-CM

## 2018-11-09 DIAGNOSIS — Z933 Colostomy status: Secondary | ICD-10-CM

## 2018-11-09 DIAGNOSIS — Z170 Estrogen receptor positive status [ER+]: Secondary | ICD-10-CM

## 2018-11-09 DIAGNOSIS — I10 Essential (primary) hypertension: Secondary | ICD-10-CM | POA: Insufficient documentation

## 2018-11-09 DIAGNOSIS — M1990 Unspecified osteoarthritis, unspecified site: Secondary | ICD-10-CM | POA: Diagnosis not present

## 2018-11-09 DIAGNOSIS — Z803 Family history of malignant neoplasm of breast: Secondary | ICD-10-CM | POA: Insufficient documentation

## 2018-11-09 DIAGNOSIS — C3412 Malignant neoplasm of upper lobe, left bronchus or lung: Secondary | ICD-10-CM | POA: Insufficient documentation

## 2018-11-09 DIAGNOSIS — Z79899 Other long term (current) drug therapy: Secondary | ICD-10-CM | POA: Diagnosis not present

## 2018-11-09 DIAGNOSIS — C3490 Malignant neoplasm of unspecified part of unspecified bronchus or lung: Secondary | ICD-10-CM

## 2018-11-09 LAB — CBC WITH DIFFERENTIAL (CANCER CENTER ONLY)
Abs Immature Granulocytes: 0.01 10*3/uL (ref 0.00–0.07)
Basophils Absolute: 0 10*3/uL (ref 0.0–0.1)
Basophils Relative: 1 %
EOS PCT: 1 %
Eosinophils Absolute: 0.1 10*3/uL (ref 0.0–0.5)
HEMATOCRIT: 34.6 % — AB (ref 36.0–46.0)
HEMOGLOBIN: 11 g/dL — AB (ref 12.0–15.0)
Immature Granulocytes: 0 %
LYMPHS PCT: 19 %
Lymphs Abs: 1.1 10*3/uL (ref 0.7–4.0)
MCH: 28.9 pg (ref 26.0–34.0)
MCHC: 31.8 g/dL (ref 30.0–36.0)
MCV: 90.8 fL (ref 80.0–100.0)
MONO ABS: 0.9 10*3/uL (ref 0.1–1.0)
Monocytes Relative: 16 %
Neutro Abs: 3.8 10*3/uL (ref 1.7–7.7)
Neutrophils Relative %: 63 %
Platelet Count: 210 10*3/uL (ref 150–400)
RBC: 3.81 MIL/uL — ABNORMAL LOW (ref 3.87–5.11)
RDW: 13.8 % (ref 11.5–15.5)
WBC Count: 5.9 10*3/uL (ref 4.0–10.5)
nRBC: 0 % (ref 0.0–0.2)

## 2018-11-09 LAB — CMP (CANCER CENTER ONLY)
ALT: 15 U/L (ref 0–44)
AST: 19 U/L (ref 15–41)
Albumin: 3.7 g/dL (ref 3.5–5.0)
Alkaline Phosphatase: 90 U/L (ref 38–126)
Anion gap: 7 (ref 5–15)
BUN: 12 mg/dL (ref 8–23)
CHLORIDE: 109 mmol/L (ref 98–111)
CO2: 27 mmol/L (ref 22–32)
CREATININE: 1.15 mg/dL — AB (ref 0.44–1.00)
Calcium: 9.9 mg/dL (ref 8.9–10.3)
GFR, Est AFR Am: 52 mL/min — ABNORMAL LOW (ref >60)
GFR, Estimated: 45 mL/min — ABNORMAL LOW (ref >60)
Glucose, Bld: 98 mg/dL (ref 70–99)
Potassium: 4.4 mmol/L (ref 3.5–5.1)
SODIUM: 143 mmol/L (ref 135–145)
Total Bilirubin: 0.4 mg/dL (ref 0.3–1.2)
Total Protein: 6.8 g/dL (ref 6.5–8.1)

## 2018-11-09 MED ORDER — CLONIDINE HCL 0.1 MG PO TABS
ORAL_TABLET | ORAL | Status: AC
  Filled 2018-11-09: qty 2

## 2018-11-09 MED ORDER — CLONIDINE HCL 0.1 MG PO TABS
0.2000 mg | ORAL_TABLET | Freq: Once | ORAL | Status: AC
  Administered 2018-11-09: 0.2 mg via ORAL

## 2018-11-09 NOTE — Progress Notes (Signed)
[place] [phone]   [phone]  OFFICE PROGRESS NOTE  Eber Hong, MD [address] 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 2) adenocarcinoma of the ascending colon (T2, N0, M0) with MSI high diagnosed in March 2017. 3) history of breast adenocarcinoma.  PRIOR THERAPY:  1) Tarceva 150 mg by mouth daily status post 68 months of treatment. 2) Status post right colectomy with lymph node dissection in May 2017.  CURRENT THERAPY: 1) Tagrisso 80 mg by mouth daily started 11/09/2016.  Status post 24 months of treatment. 2) observation for the history of colon adenocarcinoma. 3) Femara 2.5 mg by mouth daily for history of breast cancer.  INTERVAL HISTORY: Kristin [name] 78 y.o. female returns to the clinic today for follow-up visit.  The patient is feeling fine today with no concerning complaints except for stress after her son was recently diagnosed with metastatic pancreatic cancer.  Her blood pressure is elevated today and she was not taking any of her blood pressure medication since discharge from the hospital.  She denied having any chest pain, shortness breath, cough or hemoptysis.  She denied having any fever or chills.  She has no nausea, vomiting, diarrhea or constipation.  She has no headache or visual changes.  She is here today for evaluation and repeat blood work.   MEDICAL HISTORY: Past Medical History:  Diagnosis Date  . Allergy   . Anemia   . Anemia of chronic disease 09/21/2016  . Anxiety   . Arthritis    arthritis- left hip  . Blood transfusion without reported diagnosis    transfusion- 3-4 yrs ago -found to be anemic on routine lab check  . Breast cancer ([place]) 2002   bilateral- bilateral mastectomies done.  . Clinical  depression 12/02/2000  . CVA (cerebral infarction) 04/30/2013  . Encounter for antineoplastic chemotherapy 10/28/2016  . Family history of brain cancer   . Family history of breast cancer   . Family history of colon cancer 10/06/2009  . Family history of colon cancer   . Family history of prostate cancer   . GERD (gastroesophageal reflux disease)   . Glaucoma   . History of bilateral mastectomy 05/20/2010  . Hypertension   . Lung cancer ([place]) dx'd 12/2010   last Ct scan "no lung cancer" showing 11'16 CT Chest Epic.  . Malignant neoplasm of ascending colon ([place]) 01/29/2016  . Stroke [facility])    2 yrs ago-no residual  . Tubular adenoma of colon 07/2011   colon polyps ans reoccurence with malignancy found    ALLERGIES:  is allergic to doxycycline and sulfa antibiotics.  MEDICATIONS:  Current Outpatient Medications  Medication Sig Dispense Refill  . alendronate (FOSAMAX) 70 MG tablet Take 70 mg once a week by mouth.    . brimonidine-timolol (COMBIGAN) 0.2-0.5 % ophthalmic solution Place 1 drop into the right eye every 12 (twelve) hours.    . Cholecalciferol (VITAMIN D3) 2000 units capsule Take 2,000 Units daily by mouth.    . clopidogrel (PLAVIX) 75 MG tablet Take 75 mg by mouth daily.    . cycloSPORINE (RESTASIS) 0.05 % ophthalmic emulsion Place 1 drop into both eyes 2 (two) times daily.    Marland Kitchen escitalopram (LEXAPRO) 10 MG tablet Take 10 mg by mouth daily.     . famotidine (  PEPCID) 40 MG tablet Take 40 mg by mouth daily.    . feeding supplement, ENSURE ENLIVE, (ENSURE ENLIVE) LIQD Take 237 mLs by mouth 2 (two) times daily between meals. 237 mL 12  . FeFum-FePoly-FA-B Cmp-C-Biot (INTEGRA PLUS) CAPS Take 1 capsule by mouth every morning. 30 capsule 2  . letrozole (FEMARA) 2.5 MG tablet Take 1 tablet (2.5 mg total) by mouth daily. 30 tablet 3  . methimazole (TAPAZOLE) 10 MG tablet     . Multiple Vitamins-Iron (MULTIVITAMINS WITH IRON) TABS tablet Take 1 tablet by mouth daily.  0  . osimertinib  mesylate (TAGRISSO) 80 MG tablet Take 1 tablet (80 mg total) by mouth daily. 30 tablet 3   Current Facility-Administered Medications  Medication Dose Route Frequency Provider Last Rate Last Dose  . 0.9 %  sodium chloride infusion  500 mL Intravenous Continuous Armbruster, Carlota Raspberry, MD        SURGICAL HISTORY:  Past Surgical History:  Procedure Laterality Date  . APPENDECTOMY    . BOWEL DECOMPRESSION N/A 06/12/2018   Procedure: BOWEL DECOMPRESSION;  Surgeon: Rush Landmark Telford Nab., MD;  Location: [facility];  Service: Gastroenterology;  Laterality: N/A;  . cataracts Bilateral   . COLECTOMY WITH COLOSTOMY CREATION/HARTMANN PROCEDURE  06/16/2018   Procedure: CREATION/HARTMANN COLOSTOMY;  Surgeon: Michael Boston, MD;  Location: [facility];  Service: General;;  . COLONOSCOPY    . COLOSTOMY N/A 06/16/2018   Procedure: POSSIBLE COLOSTOMY;  Surgeon: Michael Boston, MD;  Location: [facility];  Service: General;  Laterality: N/A;  . EYE SURGERY    . FLEXIBLE SIGMOIDOSCOPY N/A 06/12/2018   Procedure: FLEXIBLE SIGMOIDOSCOPY;  Surgeon: Rush Landmark Telford Nab., MD;  Location: [facility];  Service: Gastroenterology;  Laterality: N/A;  . LAPAROSCOPIC SIGMOID COLECTOMY N/A 06/16/2018   Procedure: LAPAROSCOPIC LOW ANTERIOR RESECTION;  Surgeon: Michael Boston, MD;  Location: [facility];  Service: General;  Laterality: N/A;  . LUNG SURGERY     Resection -" not done. Pt . tx with Tarceva  . MASTECTOMY Bilateral   . MEDIASTINOSCOPY N/A 10/02/2016   Procedure: MEDIASTINOSCOPY;  Surgeon: [name], MD;  Location[facility] OR;  Service: Thoracic;  Laterality: N/A;  . RECONSTRUCTION BREAST W/ TRAM FLAP Bilateral    bilaterally  . robotic right hemicolectomy Right 03/06/2016   Dr. Johney Maine  . TEE WITHOUT CARDIOVERSION N/A 06/04/2013   Procedure: TRANSESOPHAGEAL ECHOCARDIOGRAM (TEE);  Surgeon: Jolaine Artist, MD;  Location: [facility] ENDOSCOPY;  Service: Cardiovascular;  Laterality: N/A;  . TONSILLECTOMY    . TOTAL HIP  ARTHROPLASTY Left 08/07/2016   Procedure: LEFT TOTAL HIP ARTHROPLASTY ANTERIOR APPROACH;  Surgeon: Gaynelle Arabian, MD;  Location: [facility];  Service: Orthopedics;  Laterality: Left;    REVIEW OF SYSTEMS:  A comprehensive review of systems was negative except for: Constitutional: positive for fatigue Musculoskeletal: positive for arthralgias   PHYSICAL EXAMINATION: General appearance: alert, cooperative, fatigued and no distress Head: Normocephalic, without obvious abnormality, atraumatic Neck: no adenopathy, no JVD, supple, symmetrical, trachea midline and thyroid not enlarged, symmetric, no tenderness/mass/nodules Lymph nodes: Cervical, supraclavicular, and axillary nodes normal. Resp: clear to auscultation bilaterally Back: symmetric, no curvature. ROM normal. No CVA tenderness. Cardio: regular rate and rhythm, S1, S2 normal, no murmur, click, rub or gallop GI: soft, non-tender; bowel sounds normal; no masses,  no organomegaly Extremities: extremities normal, atraumatic, no cyanosis or edema  ECOG PERFORMANCE STATUS: 1 - Symptomatic but completely ambulatory  Blood pressure (!) 180/63, pulse 71, temperature 98 F (36.7 C), temperature source Oral, resp. rate 18,  height _0  (1.676 m), weight 133 lb 4.8 oz (60.5 kg), SpO2 100 %.  LABORATORY DATA: Lab Results  Component Value Date   WBC 5.9 11/09/2018   HGB 11.0 (L) 11/09/2018   HCT 34.6 (L) 11/09/2018   MCV 90.8 11/09/2018   PLT 210 11/09/2018      Chemistry      Component Value Date/Time   NA 143 11/09/2018 1245   NA 138 09/26/2017 0942   K 4.4 11/09/2018 1245   K 5.2 No visable hemolysis (H) 09/26/2017 0942   CL 109 11/09/2018 1245   CL 107 04/05/2013 0754   CO2 27 11/09/2018 1245   CO2 26 09/26/2017 0942   BUN 12 11/09/2018 1245   BUN 16.0 09/26/2017 0942   CREATININE 1.15 (H) 11/09/2018 1245   CREATININE 1.2 (H) 09/26/2017 0942      Component Value Date/Time   CALCIUM 9.9 11/09/2018 1245   CALCIUM 9.9 09/26/2017  0942   ALKPHOS 90 11/09/2018 1245   ALKPHOS 68 09/26/2017 0942   AST 19 11/09/2018 1245   AST 18 09/26/2017 0942   ALT 15 11/09/2018 1245   ALT 13 09/26/2017 0942   BILITOT 0.4 11/09/2018 1245   BILITOT 0.31 09/26/2017 0942       RADIOGRAPHIC STUDIES: No results found.  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 The patient is currently on treatment with Tagrisso 80 mg by mouth daily status post 24 months.  The patient has been tolerating this treatment well. I recommended for her to continue her current treatment with Tagrisso with the same dose. For the hypertension I gave the patient a dose of clonidine 0.2 mg p.o. x1 in the clinic and she was advised to resume her blood pressure medication and to discuss with her primary care physician for adjustment of medication if needed. For the anemia, I recommended for the patient to continue on oral iron tablets and I gave her prescription for Integra +1 capsule p.o. daily. For the history of breast cancer, she will continue on Femara. For the history of colon cancer, she has no evidence for disease recurrence, she will continue on observation for now. I will see her back for follow-up visit in 1 months for evaluation after repeating CT scan of the chest, abdomen and pelvis for restaging of her disease. The patient was advised to call immediately if she has any concerning symptoms in the interval. The patient voices understanding of current disease status and treatment options and is in agreement with the current care plan. All questions were answered. The patient knows to call the clinic with any problems, questions or concerns. We can certainly see the patient  much sooner if necessary.  Disclaimer: This note was dictated with voice recognition software. Similar sounding words can inadvertently be transcribed and may not be corrected upon review.

## 2018-11-09 NOTE — Telephone Encounter (Signed)
Gave patient avs report and appointments for February.  Central radiology ill call re scan.

## 2018-11-11 ENCOUNTER — Other Ambulatory Visit: Payer: Self-pay | Admitting: Internal Medicine

## 2018-11-12 ENCOUNTER — Other Ambulatory Visit: Payer: Self-pay | Admitting: Internal Medicine

## 2018-11-20 ENCOUNTER — Inpatient Hospital Stay: Admit: 2018-11-20 | Payer: Medicare Other | Admitting: Surgery

## 2018-11-20 SURGERY — CLOSURE, COLOSTOMY
Anesthesia: General

## 2018-11-25 ENCOUNTER — Other Ambulatory Visit: Payer: Self-pay | Admitting: Internal Medicine

## 2018-12-07 ENCOUNTER — Encounter (HOSPITAL_COMMUNITY): Payer: Self-pay

## 2018-12-07 ENCOUNTER — Inpatient Hospital Stay: Payer: Medicare Other | Attending: Internal Medicine

## 2018-12-07 ENCOUNTER — Ambulatory Visit (HOSPITAL_COMMUNITY)
Admission: RE | Admit: 2018-12-07 | Discharge: 2018-12-07 | Disposition: A | Discharge status: Home / Self Care | Payer: Medicare Other | Source: Ambulatory Visit | Attending: Internal Medicine | Admitting: Internal Medicine

## 2018-12-07 DIAGNOSIS — Z9013 Acquired absence of bilateral breasts and nipples: Secondary | ICD-10-CM | POA: Insufficient documentation

## 2018-12-07 DIAGNOSIS — Z79899 Other long term (current) drug therapy: Secondary | ICD-10-CM | POA: Insufficient documentation

## 2018-12-07 DIAGNOSIS — Z85038 Personal history of other malignant neoplasm of large intestine: Secondary | ICD-10-CM | POA: Insufficient documentation

## 2018-12-07 DIAGNOSIS — C3412 Malignant neoplasm of upper lobe, left bronchus or lung: Secondary | ICD-10-CM | POA: Diagnosis not present

## 2018-12-07 DIAGNOSIS — Z800 Family history of malignant neoplasm of digestive organs: Secondary | ICD-10-CM | POA: Diagnosis not present

## 2018-12-07 DIAGNOSIS — C3490 Malignant neoplasm of unspecified part of unspecified bronchus or lung: Secondary | ICD-10-CM

## 2018-12-07 DIAGNOSIS — Z8042 Family history of malignant neoplasm of prostate: Secondary | ICD-10-CM | POA: Insufficient documentation

## 2018-12-07 DIAGNOSIS — Z79811 Long term (current) use of aromatase inhibitors: Secondary | ICD-10-CM | POA: Insufficient documentation

## 2018-12-07 DIAGNOSIS — Z9221 Personal history of antineoplastic chemotherapy: Secondary | ICD-10-CM | POA: Insufficient documentation

## 2018-12-07 DIAGNOSIS — Z9049 Acquired absence of other specified parts of digestive tract: Secondary | ICD-10-CM | POA: Diagnosis not present

## 2018-12-07 DIAGNOSIS — Z803 Family history of malignant neoplasm of breast: Secondary | ICD-10-CM | POA: Insufficient documentation

## 2018-12-07 DIAGNOSIS — C50919 Malignant neoplasm of unspecified site of unspecified female breast: Secondary | ICD-10-CM | POA: Diagnosis not present

## 2018-12-07 LAB — CBC WITH DIFFERENTIAL (CANCER CENTER ONLY)
ABS IMMATURE GRANULOCYTES: 0 10*3/uL (ref 0.00–0.07)
BASOS ABS: 0 10*3/uL (ref 0.0–0.1)
Basophils Relative: 1 %
EOS ABS: 0.2 10*3/uL (ref 0.0–0.5)
Eosinophils Relative: 4 %
HEMATOCRIT: 33.2 % — AB (ref 36.0–46.0)
Hemoglobin: 10.4 g/dL — ABNORMAL LOW (ref 12.0–15.0)
Immature Granulocytes: 0 %
LYMPHS ABS: 0.9 10*3/uL (ref 0.7–4.0)
LYMPHS PCT: 22 %
MCH: 29 pg (ref 26.0–34.0)
MCHC: 31.3 g/dL (ref 30.0–36.0)
MCV: 92.5 fL (ref 80.0–100.0)
Monocytes Absolute: 0.6 10*3/uL (ref 0.1–1.0)
Monocytes Relative: 15 %
NEUTROS ABS: 2.3 10*3/uL (ref 1.7–7.7)
NEUTROS PCT: 58 %
NRBC: 0 % (ref 0.0–0.2)
Platelet Count: 205 10*3/uL (ref 150–400)
RBC: 3.59 MIL/uL — ABNORMAL LOW (ref 3.87–5.11)
RDW: 14.4 % (ref 11.5–15.5)
WBC Count: 4 10*3/uL (ref 4.0–10.5)

## 2018-12-07 LAB — CMP (CANCER CENTER ONLY)
ALT: 15 U/L (ref 0–44)
ANION GAP: 8 (ref 5–15)
AST: 19 U/L (ref 15–41)
Albumin: 3.8 g/dL (ref 3.5–5.0)
Alkaline Phosphatase: 89 U/L (ref 38–126)
BUN: 17 mg/dL (ref 8–23)
CALCIUM: 9.6 mg/dL (ref 8.9–10.3)
CHLORIDE: 108 mmol/L (ref 98–111)
CO2: 25 mmol/L (ref 22–32)
Creatinine: 1.1 mg/dL — ABNORMAL HIGH (ref 0.44–1.00)
GFR, EST NON AFRICAN AMERICAN: 48 mL/min — AB (ref >60)
GFR, Est AFR Am: 55 mL/min — ABNORMAL LOW (ref >60)
Glucose, Bld: 95 mg/dL (ref 70–99)
POTASSIUM: 4.2 mmol/L (ref 3.5–5.1)
Sodium: 141 mmol/L (ref 135–145)
Total Bilirubin: 0.5 mg/dL (ref 0.3–1.2)
Total Protein: 6.6 g/dL (ref 6.5–8.1)

## 2018-12-07 MED ORDER — IOHEXOL 300 MG/ML  SOLN
100.0000 mL | Freq: Once | INTRAMUSCULAR | Status: AC | PRN
  Administered 2018-12-07: 100 mL via INTRAVENOUS

## 2018-12-07 MED ORDER — SODIUM CHLORIDE (PF) 0.9 % IJ SOLN
INTRAMUSCULAR | Status: AC
  Filled 2018-12-07: qty 50

## 2018-12-10 ENCOUNTER — Telehealth: Payer: Self-pay | Admitting: Internal Medicine

## 2018-12-10 ENCOUNTER — Other Ambulatory Visit: Payer: Self-pay

## 2018-12-10 ENCOUNTER — Inpatient Hospital Stay (HOSPITAL_BASED_OUTPATIENT_CLINIC_OR_DEPARTMENT_OTHER): Payer: Medicare Other | Admitting: Internal Medicine

## 2018-12-10 ENCOUNTER — Encounter: Payer: Self-pay | Admitting: Internal Medicine

## 2018-12-10 VITALS — BP 154/63 | HR 64 | Temp 97.9°F | Resp 19 | Ht 66.0 in | Wt 137.5 lb

## 2018-12-10 DIAGNOSIS — C3412 Malignant neoplasm of upper lobe, left bronchus or lung: Secondary | ICD-10-CM | POA: Diagnosis not present

## 2018-12-10 DIAGNOSIS — C50111 Malignant neoplasm of central portion of right female breast: Secondary | ICD-10-CM

## 2018-12-10 DIAGNOSIS — C50919 Malignant neoplasm of unspecified site of unspecified female breast: Secondary | ICD-10-CM | POA: Diagnosis not present

## 2018-12-10 DIAGNOSIS — Z800 Family history of malignant neoplasm of digestive organs: Secondary | ICD-10-CM

## 2018-12-10 DIAGNOSIS — Z9221 Personal history of antineoplastic chemotherapy: Secondary | ICD-10-CM

## 2018-12-10 DIAGNOSIS — Z170 Estrogen receptor positive status [ER+]: Secondary | ICD-10-CM

## 2018-12-10 DIAGNOSIS — Z85038 Personal history of other malignant neoplasm of large intestine: Secondary | ICD-10-CM

## 2018-12-10 DIAGNOSIS — Z5111 Encounter for antineoplastic chemotherapy: Secondary | ICD-10-CM

## 2018-12-10 DIAGNOSIS — C182 Malignant neoplasm of ascending colon: Secondary | ICD-10-CM

## 2018-12-10 DIAGNOSIS — Z8042 Family history of malignant neoplasm of prostate: Secondary | ICD-10-CM

## 2018-12-10 DIAGNOSIS — C3492 Malignant neoplasm of unspecified part of left bronchus or lung: Secondary | ICD-10-CM

## 2018-12-10 DIAGNOSIS — Z79899 Other long term (current) drug therapy: Secondary | ICD-10-CM

## 2018-12-10 DIAGNOSIS — C50112 Malignant neoplasm of central portion of left female breast: Secondary | ICD-10-CM

## 2018-12-10 DIAGNOSIS — Z803 Family history of malignant neoplasm of breast: Secondary | ICD-10-CM

## 2018-12-10 DIAGNOSIS — Z9049 Acquired absence of other specified parts of digestive tract: Secondary | ICD-10-CM

## 2018-12-10 DIAGNOSIS — Z79811 Long term (current) use of aromatase inhibitors: Secondary | ICD-10-CM

## 2018-12-10 DIAGNOSIS — Z9013 Acquired absence of bilateral breasts and nipples: Secondary | ICD-10-CM

## 2018-12-10 NOTE — Telephone Encounter (Signed)
Scheduled appt per 02/20 los.  Printed calendar and avs.

## 2018-12-10 NOTE — Progress Notes (Signed)
[place] [phone]   Fax:([phone]  OFFICE PROGRESS NOTE  Eber Hong, MD [address] 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 2) adenocarcinoma of the ascending colon (T2, N0, M0) with MSI high diagnosed in March 2017. 3) history of breast adenocarcinoma.  PRIOR THERAPY:  1) Tarceva 150 mg by mouth daily status post 68 months of treatment. 2) Status post right colectomy with lymph node dissection in May 2017.  CURRENT THERAPY: 1) Tagrisso 80 mg by mouth daily started 11/09/2016.  Status post 24 months of treatment. 2) observation for the history of colon adenocarcinoma. 3) Femara 2.5 mg by mouth daily for history of breast cancer.  INTERVAL HISTORY: Kristin [name] 78 y.o. female returns to the clinic today for follow-up visit.  The patient is feeling fine today with no concerning complaints except for stress after her son was recently diagnosed with metastatic pancreatic cancer.  Her blood pressure is elevated today and she was not taking any of her blood pressure medication since discharge from the hospital.  She denied having any chest pain, shortness breath, cough or hemoptysis.  She denied having any fever or chills.  She has no nausea, vomiting, diarrhea or constipation.  She has no headache or visual changes.  She is here today for evaluation and repeat blood work.   MEDICAL HISTORY: Past Medical History:  Diagnosis Date  . Allergy   . Anemia   . Anemia of chronic disease 09/21/2016  . Anxiety   . Arthritis    arthritis- left hip  . Blood transfusion without reported diagnosis    transfusion- 3-4 yrs ago -found to be anemic on routine lab check  . Breast cancer ([place]) 2002   bilateral- bilateral mastectomies done.  . Clinical  depression 12/02/2000  . CVA (cerebral infarction) 04/30/2013  . Encounter for antineoplastic chemotherapy 10/28/2016  . Family history of brain cancer   . Family history of breast cancer   . Family history of colon cancer 10/06/2009  . Family history of colon cancer   . Family history of prostate cancer   . GERD (gastroesophageal reflux disease)   . Glaucoma   . History of bilateral mastectomy 05/20/2010  . Hypertension   . Lung cancer ([place]) dx'd 12/2010   last Ct scan "no lung cancer" showing 11'16 CT Chest Epic.  . Malignant neoplasm of ascending colon ([place]) 01/29/2016  . Stroke [facility])    2 yrs ago-no residual  . Tubular adenoma of colon 07/2011   colon polyps ans reoccurence with malignancy found    ALLERGIES:  is allergic to doxycycline and sulfa antibiotics.  MEDICATIONS:  Current Outpatient Medications  Medication Sig Dispense Refill  . alendronate (FOSAMAX) 70 MG tablet Take 70 mg once a week by mouth.    . brimonidine-timolol (COMBIGAN) 0.2-0.5 % ophthalmic solution Place 1 drop into the right eye every 12 (twelve) hours.    . Cholecalciferol (VITAMIN D3) 2000 units capsule Take 2,000 Units daily by mouth.    . clopidogrel (PLAVIX) 75 MG tablet Take 75 mg by mouth daily.    . cycloSPORINE (RESTASIS) 0.05 % ophthalmic emulsion Place 1 drop into both eyes 2 (two) times daily.    Marland Kitchen escitalopram (LEXAPRO) 10 MG tablet Take 10 mg by mouth daily.     . famotidine (  PEPCID) 40 MG tablet Take 40 mg by mouth daily.    . feeding supplement, ENSURE ENLIVE, (ENSURE ENLIVE) LIQD Take 237 mLs by mouth 2 (two) times daily between meals. 237 mL 12  . FeFum-FePoly-FA-B Cmp-C-Biot (FOLIVANE-PLUS) CAPS TAKE 1 CAPSULE BY MOUTH EVERY DAY IN THE MORNING 30 capsule 2  . letrozole (FEMARA) 2.5 MG tablet Take 1 tablet (2.5 mg total) by mouth daily. 30 tablet 3  . methimazole (TAPAZOLE) 10 MG tablet     . Multiple Vitamins-Iron (MULTIVITAMINS WITH IRON) TABS tablet Take 1 tablet by mouth daily.  0  .  osimertinib mesylate (TAGRISSO) 80 MG tablet Take 1 tablet (80 mg total) by mouth daily. 30 tablet 3   Current Facility-Administered Medications  Medication Dose Route Frequency Provider Last Rate Last Dose  . 0.9 %  sodium chloride infusion  500 mL Intravenous Continuous Armbruster, Carlota Raspberry, MD        SURGICAL HISTORY:  Past Surgical History:  Procedure Laterality Date  . APPENDECTOMY    . BOWEL DECOMPRESSION N/A 06/12/2018   Procedure: BOWEL DECOMPRESSION;  Surgeon: Rush Landmark Telford Nab., MD;  Location: [facility];  Service: Gastroenterology;  Laterality: N/A;  . cataracts Bilateral   . COLECTOMY WITH COLOSTOMY CREATION/HARTMANN PROCEDURE  06/16/2018   Procedure: CREATION/HARTMANN COLOSTOMY;  Surgeon: Michael Boston, MD;  Location: [facility];  Service: General;;  . COLONOSCOPY    . COLOSTOMY N/A 06/16/2018   Procedure: POSSIBLE COLOSTOMY;  Surgeon: Michael Boston, MD;  [facility]: [facility];  Service: General;  Laterality: N/A;  . EYE SURGERY    . FLEXIBLE SIGMOIDOSCOPY N/A 06/12/2018   Procedure: FLEXIBLE SIGMOIDOSCOPY;  Surgeon: Rush Landmark Telford Nab., MD;  Location: [facility];  Service: Gastroenterology;  Laterality: N/A;  . LAPAROSCOPIC SIGMOID COLECTOMY N/A 06/16/2018   Procedure: LAPAROSCOPIC LOW ANTERIOR RESECTION;  Surgeon: Michael Boston, MD;  Location: [facility];  Service: General;  Laterality: N/A;  . LUNG SURGERY     Resection -" not done. Pt . tx with Tarceva  . MASTECTOMY Bilateral   . MEDIASTINOSCOPY N/A 10/02/2016   Procedure: MEDIASTINOSCOPY;  Surgeon: [name], MD;  Location: [facility] OR;  Service: Thoracic;  Laterality: N/A;  . RECONSTRUCTION BREAST W/ TRAM FLAP Bilateral    bilaterally  . robotic right hemicolectomy Right 03/06/2016   Dr. Johney Maine  . TEE WITHOUT CARDIOVERSION N/A 06/04/2013   Procedure: TRANSESOPHAGEAL ECHOCARDIOGRAM (TEE);  Surgeon: Jolaine Artist, MD;  Location: [facility] ENDOSCOPY;  Service: Cardiovascular;  Laterality: N/A;  . TONSILLECTOMY    .  TOTAL HIP ARTHROPLASTY Left 08/07/2016   Procedure: LEFT TOTAL HIP ARTHROPLASTY ANTERIOR APPROACH;  Surgeon: Gaynelle Arabian, MD;  [facility]: [facility];  Service: Orthopedics;  Laterality: Left;    REVIEW OF SYSTEMS:  A comprehensive review of systems was negative except for: Constitutional: positive for fatigue Musculoskeletal: positive for arthralgias   PHYSICAL EXAMINATION: General appearance: alert, cooperative, fatigued and no distress Head: Normocephalic, without obvious abnormality, atraumatic Neck: no adenopathy, no JVD, supple, symmetrical, trachea midline and thyroid not enlarged, symmetric, no tenderness/mass/nodules Lymph nodes: Cervical, supraclavicular, and axillary nodes normal. Resp: clear to auscultation bilaterally Back: symmetric, no curvature. ROM normal. No CVA tenderness. Cardio: regular rate and rhythm, S1, S2 normal, no murmur, click, rub or gallop GI: soft, non-tender; bowel sounds normal; no masses,  no organomegaly Extremities: extremities normal, atraumatic, no cyanosis or edema  ECOG PERFORMANCE STATUS: 1 - Symptomatic but completely ambulatory  Blood pressure (!) 154/63, pulse 64, temperature 97.9 F (36.6 C), temperature source Oral, resp.  rate 19, height _0  (1.676 m), weight 137 lb 8 oz (62.4 kg), SpO2 100 %.  LABORATORY DATA: Lab Results  Component Value Date   WBC 4.0 12/07/2018   HGB 10.4 (L) 12/07/2018   HCT 33.2 (L) 12/07/2018   MCV 92.5 12/07/2018   PLT 205 12/07/2018      Chemistry      Component Value Date/Time   NA 141 12/07/2018 1006   NA 138 09/26/2017 0942   K 4.2 12/07/2018 1006   K 5.2 No visable hemolysis (H) 09/26/2017 0942   CL 108 12/07/2018 1006   CL 107 04/05/2013 0754   CO2 25 12/07/2018 1006   CO2 26 09/26/2017 0942   BUN 17 12/07/2018 1006   BUN 16.0 09/26/2017 0942   CREATININE 1.10 (H) 12/07/2018 1006   CREATININE 1.2 (H) 09/26/2017 0942      Component Value Date/Time   CALCIUM 9.6 12/07/2018 1006   CALCIUM 9.9  09/26/2017 0942   ALKPHOS 89 12/07/2018 1006   ALKPHOS 68 09/26/2017 0942   AST 19 12/07/2018 1006   AST 18 09/26/2017 0942   ALT 15 12/07/2018 1006   ALT 13 09/26/2017 0942   BILITOT 0.5 12/07/2018 1006   BILITOT 0.31 09/26/2017 0942       RADIOGRAPHIC STUDIES: Ct Chest W Contrast  Result Date: 12/07/2018 CLINICAL DATA:  Non-small cell lung cancer staging. History of breast cancer and colon cancer. EXAM: CT CHEST, ABDOMEN, AND PELVIS WITH CONTRAST TECHNIQUE: Multidetector CT imaging of the chest, abdomen and pelvis was performed following the standard protocol during bolus administration of intravenous contrast. CONTRAST:  167m OMNIPAQUE IOHEXOL 300 MG/ML  SOLN COMPARISON:  09/10/2018 FINDINGS: CT CHEST FINDINGS Cardiovascular: Coronary, aortic arch, and branch vessel atherosclerotic vascular disease. Mediastinum/Nodes: Scattered small hypodense nodules in the thyroid gland, similar to prior. Moderate-sized hiatal hernia. She Lungs/Pleura: Postoperative findings in the left upper lobe. Trace left pleural fluid medially along the lung base, for example on image 54/2. Musculoskeletal: Lower thoracic spondylosis. Grade 1 degenerative retrolisthesis at T12-L1. CT ABDOMEN PELVIS FINDINGS Hepatobiliary: Stable right hepatic lobe cyst. Mild gallbladder wall thickening although this may be from contraction. No biliary dilatation. Pancreas: Unremarkable Spleen: Unremarkable Adrenals/Urinary Tract: Small bilateral hypodense renal lesions are likely cysts but technically too small to characterize. Stomach/Bowel: Mild gastric antral wall thickening. Descending colostomy. Rectal pouch noted. Vascular/Lymphatic: Aortoiliac atherosclerotic vascular disease. Reproductive: Scattered small calcified uterine fibroids. Other: No supplemental non-categorized findings. Musculoskeletal: Left hip prosthesis. Lower lumbar degenerative disc disease and spondylosis with mild right foraminal impingement L5-S1. A small  sclerotic lesion in the left ischium is probably a bone island. IMPRESSION: 1. As a trace medial left basilar pleural effusion which is very small but increased from prior. 2. No specific findings of active malignancy. 3. Other imaging findings of potential clinical significance: Aortic Atherosclerosis (ICD10-I70.0). Coronary atherosclerosis. Moderate-sized hiatal hernia. Small thyroid nodules. Gallbladder wall thickening probably from contraction. Mild gastric antral wall thickening is nonspecific but antritis is not excluded. Right foraminal impingement at L5-S1. Scattered small calcified uterine fibroids. Electronically Signed   By: WVan ClinesM.D.   On: 12/07/2018 15:22   Ct Abdomen Pelvis W Contrast  Result Date: 12/07/2018 CLINICAL DATA:  Non-small cell lung cancer staging. History of breast cancer and colon cancer. EXAM: CT CHEST, ABDOMEN, AND PELVIS WITH CONTRAST TECHNIQUE: Multidetector CT imaging of the chest, abdomen and pelvis was performed following the standard protocol during bolus administration of intravenous contrast. CONTRAST:  107mOMNIPAQUE IOHEXOL 300 MG/ML  SOLN COMPARISON:  09/10/2018 FINDINGS: CT CHEST FINDINGS Cardiovascular: Coronary, aortic arch, and branch vessel atherosclerotic vascular disease. Mediastinum/Nodes: Scattered small hypodense nodules in the thyroid gland, similar to prior. Moderate-sized hiatal hernia. She Lungs/Pleura: Postoperative findings in the left upper lobe. Trace left pleural fluid medially along the lung base, for example on image 54/2. Musculoskeletal: Lower thoracic spondylosis. Grade 1 degenerative retrolisthesis at T12-L1. CT ABDOMEN PELVIS FINDINGS Hepatobiliary: Stable right hepatic lobe cyst. Mild gallbladder wall thickening although this may be from contraction. No biliary dilatation. Pancreas: Unremarkable Spleen: Unremarkable Adrenals/Urinary Tract: Small bilateral hypodense renal lesions are likely cysts but technically too small to  characterize. Stomach/Bowel: Mild gastric antral wall thickening. Descending colostomy. Rectal pouch noted. Vascular/Lymphatic: Aortoiliac atherosclerotic vascular disease. Reproductive: Scattered small calcified uterine fibroids. Other: No supplemental non-categorized findings. Musculoskeletal: Left hip prosthesis. Lower lumbar degenerative disc disease and spondylosis with mild right foraminal impingement L5-S1. A small sclerotic lesion in the left ischium is probably a bone island. IMPRESSION: 1. As a trace medial left basilar pleural effusion which is very small but increased from prior. 2. No specific findings of active malignancy. 3. Other imaging findings of potential clinical significance: Aortic Atherosclerosis (ICD10-I70.0). Coronary atherosclerosis. Moderate-sized hiatal hernia. Small thyroid nodules. Gallbladder wall thickening probably from contraction. Mild gastric antral wall thickening is nonspecific but antritis is not excluded. Right foraminal impingement at L5-S1. Scattered small calcified uterine fibroids. Electronically Signed   By: Van Clines M.D.   On: 12/07/2018 15:22    ASSESSMENT AND PLAN:  This is a very pleasant 78 years old white female with metastatic non-small cell lung cancer, adenocarcinoma diagnosed in March 2012 with positive EGFR mutation status post 68 months of treatment with Tarceva discontinued secondary to disease progression and development of EGFR T790M resistant mutation. The patient is currently on treatment with Tagrisso 80 mg by mouth daily status post 24 months.  The patient has been tolerating this treatment well. I recommended for her to continue her current treatment with Tagrisso with the same dose. For the hypertension I gave the patient a dose of clonidine 0.2 mg p.o. x1 in the clinic and she was advised to resume her blood pressure medication and to discuss with her primary care physician for adjustment of medication if needed. For the anemia, I  recommended for the patient to continue on oral iron tablets and I gave her prescription for Integra +1 capsule p.o. daily. For the history of breast cancer, she will continue on Femara. For the history of colon cancer, she has no evidence for disease recurrence, she will continue on observation for now. I will see her back for follow-up visit in 1 months for evaluation after repeating CT scan of the chest, abdomen and pelvis for restaging of her disease. The patient was advised to call immediately if she has any concerning symptoms in the interval. The patient voices understanding of current disease status and treatment options and is in agreement with the current care plan. All questions were answered. The patient knows to call the clinic with any problems, questions or concerns. We can certainly see the patient much sooner if necessary.  Disclaimer: This note was dictated with voice recognition software. Similar sounding words can inadvertently be transcribed and may not be corrected upon review.            [place] Telephone:(336) [phone]   Fax:(336) [place], [place] [address]  DIAGNOSIS:  1) metastatic  non-small cell lung cancer, adenocarcinoma diagnosed in March 2012 with positive EGFR mutation in exon 21 (L858R). 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 2) adenocarcinoma of the ascending colon (T2, N0, M0) with MSI high diagnosed in March 2017. 3) history of breast adenocarcinoma.  PRIOR THERAPY:  1) Tarceva 150 mg by mouth daily status post 68 months of treatment. 2) Status post right colectomy with lymph node dissection in May 2017.  CURRENT THERAPY: 1) Tagrisso 80 mg by mouth daily started 11/09/2016.  Status post 25 months of treatment. 2) observation for the history of colon adenocarcinoma. 3) Femara 2.5 mg by mouth daily for history of breast cancer.  INTERVAL  HISTORY: Kristin [name] 78 y.o. female returns to the clinic today for follow-up visit accompanied by her daughter.  The patient is feeling fine today with no specific complaints except for intermittent right hip pain.  She is taken care of her terminally ill son with pancreatic cancer and she does a lot of maneuver to help him.  She thinks that she pulled a muscle.  She denied having any current chest pain, shortness of breath, cough or hemoptysis.  She denied having any fever or chills.  She has no recent weight loss or night sweats.  She has no nausea, vomiting, diarrhea or constipation.  She continues to tolerate her treatment with Tagrisso fairly well.  The patient had repeat CT scan of the chest, abdomen and pelvis performed recently and she is here for evaluation and discussion of her scan results.  MEDICAL HISTORY: Past Medical History:  Diagnosis Date  . Allergy   . Anemia   . Anemia of chronic disease 09/21/2016  . Anxiety   . Arthritis    arthritis- left hip  . Blood transfusion without reported diagnosis    transfusion- 3-4 yrs ago -found to be anemic on routine lab check  . Breast cancer ([place]) 2002   bilateral- bilateral mastectomies done.  . 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 CVA (cerebral infarction) 04/30/2013  . Encounter for antineoplastic chemotherapy 10/28/2016  . Family history of brain cancer   . Family history of breast cancer   . Family history of colon cancer 10/06/2009  . Family history of colon cancer   . Family history of prostate cancer   . GERD (gastroesophageal reflux disease)   . Glaucoma   . History of bilateral mastectomy 05/20/2010  . Hypertension   . Lung cancer ([place]) dx'd 12/2010   last Ct scan "no lung cancer" showing 11'16 CT Chest Epic.  . Malignant neoplasm of ascending colon ([place]) 01/29/2016  . Stroke [facility])    2 yrs ago-no residual  . Tubular adenoma of colon 07/2011   colon polyps ans reoccurence with malignancy found    ALLERGIES:  is allergic  to doxycycline and sulfa antibiotics.  MEDICATIONS:  Current Outpatient Medications  Medication Sig Dispense Refill  . alendronate (FOSAMAX) 70 MG tablet Take 70 mg once a week by mouth.    . brimonidine-timolol (COMBIGAN) 0.2-0.5 % ophthalmic solution Place 1 drop into the right eye every 12 (twelve) hours.    . Cholecalciferol (VITAMIN D3) 2000 units capsule Take 2,000 Units daily by mouth.    . clopidogrel (PLAVIX) 75 MG tablet Take 75 mg by mouth daily.    . cycloSPORINE (RESTASIS) 0.05 % ophthalmic emulsion Place 1 drop into both eyes 2 (two) times daily.    Marland Kitchen escitalopram (LEXAPRO) 10 MG tablet Take 10 mg by mouth daily.     Marland Kitchen  famotidine (PEPCID) 40 MG tablet Take 40 mg by mouth daily.    . feeding supplement, ENSURE ENLIVE, (ENSURE ENLIVE) LIQD Take 237 mLs by mouth 2 (two) times daily between meals. 237 mL 12  . FeFum-FePoly-FA-B Cmp-C-Biot (FOLIVANE-PLUS) CAPS TAKE 1 CAPSULE BY MOUTH EVERY DAY IN THE MORNING 30 capsule 2  . letrozole (FEMARA) 2.5 MG tablet Take 1 tablet (2.5 mg total) by mouth daily. 30 tablet 3  . methimazole (TAPAZOLE) 10 MG tablet     . Multiple Vitamins-Iron (MULTIVITAMINS WITH IRON) TABS tablet Take 1 tablet by mouth daily.  0  . osimertinib mesylate (TAGRISSO) 80 MG tablet Take 1 tablet (80 mg total) by mouth daily. 30 tablet 3   Current Facility-Administered Medications  Medication Dose Route Frequency Provider Last Rate Last Dose  . 0.9 %  sodium chloride infusion  500 mL Intravenous Continuous Armbruster, Carlota Raspberry, MD        SURGICAL HISTORY:  Past Surgical History:  Procedure Laterality Date  . APPENDECTOMY    . BOWEL DECOMPRESSION N/A 06/12/2018   Procedure: BOWEL DECOMPRESSION;  Surgeon: Rush Landmark Telford Nab., MD;  Location: [facility];  Service: Gastroenterology;  Laterality: N/A;  . cataracts Bilateral   . COLECTOMY WITH COLOSTOMY CREATION/HARTMANN PROCEDURE  06/16/2018   Procedure: CREATION/HARTMANN COLOSTOMY;  Surgeon: Michael Boston, MD;   Location[facility];  Service: General;;  . COLONOSCOPY    . COLOSTOMY N/A 06/16/2018   Procedure: POSSIBLE COLOSTOMY;  Surgeon: Michael Boston, MD;  [facility];  Service: General;  Laterality: N/A;  . EYE SURGERY    . FLEXIBLE SIGMOIDOSCOPY N/A 06/12/2018   Procedure: FLEXIBLE SIGMOIDOSCOPY;  Surgeon: Rush Landmark Telford Nab., MD;  Location: [facility];  Service: Gastroenterology;  Laterality: N/A;  . LAPAROSCOPIC SIGMOID COLECTOMY N/A 06/16/2018   Procedure: LAPAROSCOPIC LOW ANTERIOR RESECTION;  Surgeon: Michael Boston, MD;  Location[facility];  Service: General;  Laterality: N/A;  . LUNG SURGERY     Resection -" not done. Pt . tx with Tarceva  . MASTECTOMY Bilateral   . MEDIASTINOSCOPY N/A 10/02/2016   Procedure: MEDIASTINOSCOPY;  Surgeon: [name], MD;  Location: [facility];  Service: Thoracic;  Laterality: N/A;  . RECONSTRUCTION BREAST W/ TRAM FLAP Bilateral    bilaterally  . robotic right hemicolectomy Right 03/06/2016   Dr. Johney Maine  . TEE WITHOUT CARDIOVERSION N/A 06/04/2013   Procedure: TRANSESOPHAGEAL ECHOCARDIOGRAM (TEE);  Surgeon: Jolaine Artist, MD;  Location: [facility];  Service: Cardiovascular;  Laterality: N/A;  . TONSILLECTOMY    . TOTAL HIP ARTHROPLASTY Left 08/07/2016   Procedure: LEFT TOTAL HIP ARTHROPLASTY ANTERIOR APPROACH;  Surgeon: Gaynelle Arabian, MD;  [facility];  Service: Orthopedics;  Laterality: Left;    REVIEW OF SYSTEMS:  Constitutional: positive for fatigue Eyes: negative Ears, nose, mouth, throat, and face: negative Respiratory: negative Cardiovascular: negative Gastrointestinal: negative Genitourinary:negative Integument/breast: negative Hematologic/lymphatic: negative Musculoskeletal:positive for arthralgias Neurological: negative Behavioral/Psych: negative Endocrine: negative Allergic/Immunologic: negative   PHYSICAL EXAMINATION: General appearance: alert, cooperative, fatigued and no distress Head: Normocephalic, without obvious  abnormality, atraumatic Neck: no adenopathy, no JVD, supple, symmetrical, trachea midline and thyroid not enlarged, symmetric, no tenderness/mass/nodules Lymph nodes: Cervical, supraclavicular, and axillary nodes normal. Resp: clear to auscultation bilaterally Back: symmetric, no curvature. ROM normal. No CVA tenderness. Cardio: regular rate and rhythm, S1, S2 normal, no murmur, click, rub or gallop GI: soft, non-tender; bowel sounds normal; no masses,  no organomegaly Extremities: extremities normal, atraumatic, no cyanosis or edema Neurologic: Alert and oriented X 3, normal  strength and tone. Normal symmetric reflexes. Normal coordination and gait  ECOG PERFORMANCE STATUS: 1 - Symptomatic but completely ambulatory  Blood pressure (!) 154/63, pulse 64, temperature 97.9 F (36.6 C), temperature source Oral, resp. rate 19, height _0  (1.676 m), weight 137 lb 8 oz (62.4 kg), SpO2 100 %.  LABORATORY DATA: Lab Results  Component Value Date   WBC 4.0 12/07/2018   HGB 10.4 (L) 12/07/2018   HCT 33.2 (L) 12/07/2018   MCV 92.5 12/07/2018   PLT 205 12/07/2018      Chemistry      Component Value Date/Time   NA 141 12/07/2018 1006   NA 138 09/26/2017 0942   K 4.2 12/07/2018 1006   K 5.2 No visable hemolysis (H) 09/26/2017 0942   CL 108 12/07/2018 1006   CL 107 04/05/2013 0754   CO2 25 12/07/2018 1006   CO2 26 09/26/2017 0942   BUN 17 12/07/2018 1006   BUN 16.0 09/26/2017 0942   CREATININE 1.10 (H) 12/07/2018 1006   CREATININE 1.2 (H) 09/26/2017 0942      Component Value Date/Time   CALCIUM 9.6 12/07/2018 1006   CALCIUM 9.9 09/26/2017 0942   ALKPHOS 89 12/07/2018 1006   ALKPHOS 68 09/26/2017 0942   AST 19 12/07/2018 1006   AST 18 09/26/2017 0942   ALT 15 12/07/2018 1006   ALT 13 09/26/2017 0942   BILITOT 0.5 12/07/2018 1006   BILITOT 0.31 09/26/2017 0942       RADIOGRAPHIC STUDIES: Ct Chest W Contrast  Result Date: 12/07/2018 CLINICAL DATA:  Non-small cell lung cancer  staging. History of breast cancer and colon cancer. EXAM: CT CHEST, ABDOMEN, AND PELVIS WITH CONTRAST TECHNIQUE: Multidetector CT imaging of the chest, abdomen and pelvis was performed following the standard protocol during bolus administration of intravenous contrast. CONTRAST:  169m OMNIPAQUE IOHEXOL 300 MG/ML  SOLN COMPARISON:  09/10/2018 FINDINGS: CT CHEST FINDINGS Cardiovascular: Coronary, aortic arch, and branch vessel atherosclerotic vascular disease. Mediastinum/Nodes: Scattered small hypodense nodules in the thyroid gland, similar to prior. Moderate-sized hiatal hernia. She Lungs/Pleura: Postoperative findings in the left upper lobe. Trace left pleural fluid medially along the lung base, for example on image 54/2. Musculoskeletal: Lower thoracic spondylosis. Grade 1 degenerative retrolisthesis at T12-L1. CT ABDOMEN PELVIS FINDINGS Hepatobiliary: Stable right hepatic lobe cyst. Mild gallbladder wall thickening although this may be from contraction. No biliary dilatation. Pancreas: Unremarkable Spleen: Unremarkable Adrenals/Urinary Tract: Small bilateral hypodense renal lesions are likely cysts but technically too small to characterize. Stomach/Bowel: Mild gastric antral wall thickening. Descending colostomy. Rectal pouch noted. Vascular/Lymphatic: Aortoiliac atherosclerotic vascular disease. Reproductive: Scattered small calcified uterine fibroids. Other: No supplemental non-categorized findings. Musculoskeletal: Left hip prosthesis. Lower lumbar degenerative disc disease and spondylosis with mild right foraminal impingement L5-S1. A small sclerotic lesion in the left ischium is probably a bone island. IMPRESSION: 1. As a trace medial left basilar pleural effusion which is very small but increased from prior. 2. No specific findings of active malignancy. 3. Other imaging findings of potential clinical significance: Aortic Atherosclerosis (ICD10-I70.0). Coronary atherosclerosis. Moderate-sized hiatal hernia.  Small thyroid nodules. Gallbladder wall thickening probably from contraction. Mild gastric antral wall thickening is nonspecific but antritis is not excluded. Right foraminal impingement at L5-S1. Scattered small calcified uterine fibroids. Electronically Signed   By: WVan ClinesM.D.   On: 12/07/2018 15:22   Ct Abdomen Pelvis W Contrast  Result Date: 12/07/2018 CLINICAL DATA:  Non-small cell lung cancer staging. History of breast cancer and colon cancer. EXAM: CT  CHEST, ABDOMEN, AND PELVIS WITH CONTRAST TECHNIQUE: Multidetector CT imaging of the chest, abdomen and pelvis was performed following the standard protocol during bolus administration of intravenous contrast. CONTRAST:  162m OMNIPAQUE IOHEXOL 300 MG/ML  SOLN COMPARISON:  09/10/2018 FINDINGS: CT CHEST FINDINGS Cardiovascular: Coronary, aortic arch, and branch vessel atherosclerotic vascular disease. Mediastinum/Nodes: Scattered small hypodense nodules in the thyroid gland, similar to prior. Moderate-sized hiatal hernia. She Lungs/Pleura: Postoperative findings in the left upper lobe. Trace left pleural fluid medially along the lung base, for example on image 54/2. Musculoskeletal: Lower thoracic spondylosis. Grade 1 degenerative retrolisthesis at T12-L1. CT ABDOMEN PELVIS FINDINGS Hepatobiliary: Stable right hepatic lobe cyst. Mild gallbladder wall thickening although this may be from contraction. No biliary dilatation. Pancreas: Unremarkable Spleen: Unremarkable Adrenals/Urinary Tract: Small bilateral hypodense renal lesions are likely cysts but technically too small to characterize. Stomach/Bowel: Mild gastric antral wall thickening. Descending colostomy. Rectal pouch noted. Vascular/Lymphatic: Aortoiliac atherosclerotic vascular disease. Reproductive: Scattered small calcified uterine fibroids. Other: No supplemental non-categorized findings. Musculoskeletal: Left hip prosthesis. Lower lumbar degenerative disc disease and spondylosis with  mild right foraminal impingement L5-S1. A small sclerotic lesion in the left ischium is probably a bone island. IMPRESSION: 1. As a trace medial left basilar pleural effusion which is very small but increased from prior. 2. No specific findings of active malignancy. 3. Other imaging findings of potential clinical significance: Aortic Atherosclerosis (ICD10-I70.0). Coronary atherosclerosis. Moderate-sized hiatal hernia. Small thyroid nodules. Gallbladder wall thickening probably from contraction. Mild gastric antral wall thickening is nonspecific but antritis is not excluded. Right foraminal impingement at L5-S1. Scattered small calcified uterine fibroids. Electronically Signed   By: WVan ClinesM.D.   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 The patient is currently on treatment with Tagrisso 80 mg by mouth daily status post 25 months.  The patient continues to tolerate her treatment well with no concerning adverse effects. She had repeat CT scan of the chest, abdomen and pelvis performed recently.  I personally and independently reviewed the scans and discussed the results with the patient and her daughter. Her scan showed no concerning findings for disease progression. I recommended for the patient to continue her current treatment with Tagrisso. I will see her back for follow-up visit in 6 weeks for evaluation and repeat blood work. For the anemia, I recommended for the patient to continue on oral iron tablets and I gave her prescription for Integra +1 capsule p.o. daily. For the history of breast cancer, she will continue on Femara. For the history of colon cancer, she has no evidence for  disease recurrence, she will continue on observation for now. The patient was advised to call immediately if she has any concerning symptoms in the interval. The patient voices understanding of current disease status and treatment options and is in agreement with the current care plan. All questions were answered. The patient knows to call the clinic with any problems, questions or concerns. We can certainly see the patient much sooner if necessary.  Disclaimer: This note was dictated with voice recognition software. Similar sounding words can inadvertently be transcribed and may not be corrected upon review.

## 2018-12-19 ENCOUNTER — Other Ambulatory Visit: Payer: Self-pay | Admitting: Internal Medicine

## 2018-12-24 ENCOUNTER — Encounter (INDEPENDENT_AMBULATORY_CARE_PROVIDER_SITE_OTHER): Payer: Medicare Other | Admitting: Ophthalmology

## 2019-01-18 ENCOUNTER — Telehealth: Payer: Self-pay | Admitting: Medical Oncology

## 2019-01-18 NOTE — Telephone Encounter (Signed)
Patient requests r/s or change to phone visit.  Per [facility] evisit scheduled.

## 2019-01-20 ENCOUNTER — Inpatient Hospital Stay: Payer: Medicare Other | Attending: Internal Medicine | Admitting: Internal Medicine

## 2019-01-20 ENCOUNTER — Inpatient Hospital Stay: Payer: Medicare Other

## 2019-01-20 DIAGNOSIS — C3412 Malignant neoplasm of upper lobe, left bronchus or lung: Secondary | ICD-10-CM

## 2019-01-20 DIAGNOSIS — C182 Malignant neoplasm of ascending colon: Secondary | ICD-10-CM

## 2019-01-20 NOTE — Progress Notes (Signed)
Virtual Visit via Telephone Note  I connected with Kristin [name] on 01/20/19 at 12:15 PM EDT by telephone and verified that I am speaking with the correct person using two identifiers.   I discussed the limitations, risks, security and privacy concerns of performing an evaluation and management service by telephone and the availability of in person appointments. I also discussed with the patient that there may be a patient responsible charge related to this service. The patient expressed understanding and agreed to proceed.  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 2) adenocarcinoma of the ascending colon (T2, N0, M0) with MSI high diagnosed in March 2017. 3) history of breast adenocarcinoma.  PRIOR THERAPY:  1) Tarceva 150 mg by mouth daily status post 69 months of treatment. 2) Status post right colectomy with lymph node dissection in May 2017.  CURRENT THERAPY: 1) Tagrisso 80 mg by mouth daily started 11/09/2016.  Status post 27 months of treatment. 2) observation for the history of colon adenocarcinoma. 3) Femara 2.5 mg by mouth daily for history of breast cancer.  History of Present Illness:   Kristin [name] 78 y.o. female has a visual telephone visit with me today for follow-up of her lung cancer.  The patient is feeling fine today with no concerning complaints except for pain in the left hip area.  She denied having any current chest pain, shortness of breath, cough or hemoptysis.  She denied having any fever or chills.  She has no nausea, vomiting, diarrhea or constipation.  She denied having any headache or visual changes.  She still under a lot of stress taking care of her son with terminal pancreatic cancer.  MEDICAL HISTORY: Past Medical History:   Diagnosis Date  . Allergy   . Anemia   . Anemia of chronic disease 09/21/2016  . Anxiety   . Arthritis    arthritis- left hip  . Blood transfusion without reported diagnosis    transfusion- 3-4 yrs ago -found to be anemic on routine lab check  . Breast cancer ([place]) 2002   bilateral- bilateral mastectomies done.  . 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 CVA (cerebral infarction) 04/30/2013  . Encounter for antineoplastic chemotherapy 10/28/2016  . Family history of brain cancer   . Family history of breast cancer   . Family history of colon cancer 10/06/2009  . Family history of colon cancer   . Family history of prostate cancer   . GERD (gastroesophageal reflux disease)   . Glaucoma   . History of bilateral mastectomy 05/20/2010  . Hypertension   . Lung cancer ([place]) dx'd 12/2010   last Ct scan "no lung cancer" showing 11'16 CT Chest Epic.  . Malignant neoplasm of ascending colon ([place]) 01/29/2016  . Stroke [facility])    2 yrs ago-no residual  . Tubular adenoma of colon 07/2011   colon polyps ans reoccurence with malignancy found    ALLERGIES:  is allergic to doxycycline and sulfa antibiotics.  MEDICATIONS:  Current Outpatient Medications  Medication Sig Dispense Refill  . alendronate (FOSAMAX) 70 MG tablet Take 70 mg once a week by mouth.    . brimonidine-timolol (COMBIGAN) 0.2-0.5 % ophthalmic solution Place 1 drop into the right eye every 12 (twelve) hours.    . Cholecalciferol (VITAMIN D3) 2000 units capsule Take 2,000 Units daily by  mouth.    . clopidogrel (PLAVIX) 75 MG tablet Take 75 mg by mouth daily.    . cycloSPORINE (RESTASIS) 0.05 % ophthalmic emulsion Place 1 drop into both eyes 2 (two) times daily.    Marland Kitchen escitalopram (LEXAPRO) 10 MG tablet Take 10 mg by mouth daily.     . famotidine (PEPCID) 40 MG tablet Take 40 mg by mouth daily.    . feeding supplement, ENSURE ENLIVE, (ENSURE ENLIVE) LIQD Take 237 mLs by mouth 2 (two) times daily between meals. 237 mL 12  . FeFum-FePoly-FA-B  Cmp-C-Biot (FOLIVANE-PLUS) CAPS TAKE 1 CAPSULE BY MOUTH EVERY DAY IN THE MORNING 30 capsule 2  . letrozole (FEMARA) 2.5 MG tablet Take 1 tablet (2.5 mg total) by mouth daily. 30 tablet 3  . methimazole (TAPAZOLE) 10 MG tablet     . Multiple Vitamins-Iron (MULTIVITAMINS WITH IRON) TABS tablet Take 1 tablet by mouth daily.  0  . osimertinib mesylate (TAGRISSO) 80 MG tablet Take 1 tablet (80 mg total) by mouth daily. 30 tablet 3   Current Facility-Administered Medications  Medication Dose Route Frequency Provider Last Rate Last Dose  . 0.9 %  sodium chloride infusion  500 mL Intravenous Continuous Armbruster, Carlota Raspberry, MD        SURGICAL HISTORY:  Past Surgical History:  Procedure Laterality Date  . APPENDECTOMY    . BOWEL DECOMPRESSION N/A 06/12/2018   Procedure: BOWEL DECOMPRESSION;  Surgeon: Rush Landmark Telford Nab., MD;  Location: [facility];  Service: Gastroenterology;  Laterality: N/A;  . cataracts Bilateral   . COLECTOMY WITH COLOSTOMY CREATION/HARTMANN PROCEDURE  06/16/2018   Procedure: CREATION/HARTMANN COLOSTOMY;  Surgeon: Michael Boston, MD;  Location: [facility];  Service: General;;  . COLONOSCOPY    . COLOSTOMY N/A 06/16/2018   Procedure: POSSIBLE COLOSTOMY;  Surgeon: Michael Boston, MD;  Location: [facility];  Service: General;  Laterality: N/A;  . EYE SURGERY    . FLEXIBLE SIGMOIDOSCOPY N/A 06/12/2018   Procedure: FLEXIBLE SIGMOIDOSCOPY;  Surgeon: Rush Landmark Telford Nab., MD;  Location: [facility];  Service: Gastroenterology;  Laterality: N/A;  . LAPAROSCOPIC SIGMOID COLECTOMY N/A 06/16/2018   Procedure: LAPAROSCOPIC LOW ANTERIOR RESECTION;  Surgeon: Michael Boston, MD;  Location: [facility];  Service: General;  Laterality: N/A;  . LUNG SURGERY     Resection -" not done. Pt . tx with Tarceva  . MASTECTOMY Bilateral   . MEDIASTINOSCOPY N/A 10/02/2016   Procedure: MEDIASTINOSCOPY;  Surgeon: [name], MD;  Location[facility] OR;  Service: Thoracic;  Laterality: N/A;  . RECONSTRUCTION  BREAST W/ TRAM FLAP Bilateral    bilaterally  . robotic right hemicolectomy Right 03/06/2016   Dr. Johney Maine  . TEE WITHOUT CARDIOVERSION N/A 06/04/2013   Procedure: TRANSESOPHAGEAL ECHOCARDIOGRAM (TEE);  Surgeon: Jolaine Artist, MD;  Location: [facility] ENDOSCOPY;  Service: Cardiovascular;  Laterality: N/A;  . TONSILLECTOMY    . TOTAL HIP ARTHROPLASTY Left 08/07/2016   Procedure: LEFT TOTAL HIP ARTHROPLASTY ANTERIOR APPROACH;  Surgeon: Gaynelle Arabian, MD;  Location: [facility]S;  Service: Orthopedics;  Laterality: Left;    REVIEW OF SYSTEMS:  A comprehensive review of systems was negative except for: Musculoskeletal: positive for arthralgias    LABORATORY DATA: Lab Results  Component Value Date   WBC 4.0 12/07/2018   HGB 10.4 (L) 12/07/2018   HCT 33.2 (L) 12/07/2018   MCV 92.5 12/07/2018   PLT 205 12/07/2018      Chemistry      Component Value Date/Time   NA 141 12/07/2018 1006   NA 138  09/26/2017 0942   K 4.2 12/07/2018 1006   K 5.2 No visable hemolysis (H) 09/26/2017 0942   CL 108 12/07/2018 1006   CL 107 04/05/2013 0754   CO2 25 12/07/2018 1006   CO2 26 09/26/2017 0942   BUN 17 12/07/2018 1006   BUN 16.0 09/26/2017 0942   CREATININE 1.10 (H) 12/07/2018 1006   CREATININE 1.2 (H) 09/26/2017 0942      Component Value Date/Time   CALCIUM 9.6 12/07/2018 1006   CALCIUM 9.9 09/26/2017 0942   ALKPHOS 89 12/07/2018 1006   ALKPHOS 68 09/26/2017 0942   AST 19 12/07/2018 1006   AST 18 09/26/2017 0942   ALT 15 12/07/2018 1006   ALT 13 09/26/2017 0942   BILITOT 0.5 12/07/2018 1006   BILITOT 0.31 09/26/2017 0942       RADIOGRAPHIC STUDIES: No results found.  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 The  patient is currently on treatment with Tagrisso 80 mg by mouth daily status post 27 months.  She continues to tolerate this treatment well with no concerning adverse effects. I recommended for her to continue her current treatment with Tagrisso for now. We will arrange for the patient to have repeat blood work locally. I will see her back for follow-up visit in 2 months for evaluation with repeat CBC and comprehensive metabolic panel.   Follow Up Instructions: Follow-up visit in 2 months with lab work.   I discussed the assessment and treatment plan with the patient. The patient was provided an opportunity to ask questions and all were answered. The patient agreed with the plan and demonstrated an understanding of the instructions.   The patient was advised to call back or seek an in-person evaluation if the symptoms worsen or if the condition fails to improve as anticipated.  I provided 15 minutes of non-face-to-face time during this encounter.   Eilleen Kempf, MD

## 2019-01-21 ENCOUNTER — Telehealth: Payer: Self-pay | Admitting: Medical Oncology

## 2019-01-21 ENCOUNTER — Telehealth: Payer: Self-pay | Admitting: Internal Medicine

## 2019-01-21 NOTE — Telephone Encounter (Signed)
Lab orderfaxed to Dr Brynda Greathouse.-pt notified to call provider office for appt.

## 2019-01-21 NOTE — Telephone Encounter (Signed)
No 4/1 los °

## 2019-01-25 ENCOUNTER — Telehealth: Payer: Self-pay | Admitting: Internal Medicine

## 2019-01-25 NOTE — Telephone Encounter (Signed)
Scheduled per sch msg. Called and spoke with patient. Confirmed date and time of appt.

## 2019-02-16 ENCOUNTER — Telehealth: Payer: Self-pay | Admitting: *Deleted

## 2019-02-16 NOTE — Telephone Encounter (Signed)
"  [place] with [employer] [phone]).  Need diagnostic codes acceptable for CBC-diff and complete Magnesium ordered by Dr.Mohamed"  Provided all diagnosis codes noted with office visit.

## 2019-02-18 NOTE — Telephone Encounter (Signed)
Second call today from "[place], [place] billing [phone]).  Medicare is requesting more information.  Need diagnosis code for lab draw 01-25-2019.  Request is for the [facility] but no location of lab site.  Provider and code are not clear but looks like Dr. Jerilynn Mages - o - l.; code may be O34.__ ."  Provided H.I.M., fax number [phone] to fax Medicare request for clarification and verification of need.  [place] notified December 07, 2018 is most recent lab draw at [facility].

## 2019-02-22 ENCOUNTER — Other Ambulatory Visit: Payer: Self-pay | Admitting: Medical Oncology

## 2019-02-22 DIAGNOSIS — C3412 Malignant neoplasm of upper lobe, left bronchus or lung: Secondary | ICD-10-CM

## 2019-02-22 DIAGNOSIS — Z170 Estrogen receptor positive status [ER+]: Secondary | ICD-10-CM

## 2019-02-22 DIAGNOSIS — C50112 Malignant neoplasm of central portion of left female breast: Secondary | ICD-10-CM

## 2019-02-22 DIAGNOSIS — Z5111 Encounter for antineoplastic chemotherapy: Secondary | ICD-10-CM

## 2019-02-22 DIAGNOSIS — D638 Anemia in other chronic diseases classified elsewhere: Secondary | ICD-10-CM

## 2019-02-22 DIAGNOSIS — C50111 Malignant neoplasm of central portion of right female breast: Secondary | ICD-10-CM

## 2019-02-22 DIAGNOSIS — C182 Malignant neoplasm of ascending colon: Secondary | ICD-10-CM

## 2019-02-22 MED ORDER — OSIMERTINIB MESYLATE 80 MG PO TABS: 80.0000 mg | ORAL_TABLET | Freq: Every day | ORAL | 3 refills | Status: DC

## 2019-02-22 NOTE — Addendum Note (Signed)
Addended by: Ardeen Garland on: 02/22/2019 04:47 PM   Modules accepted: Orders

## 2019-02-23 ENCOUNTER — Telehealth: Payer: Self-pay | Admitting: Medical Oncology

## 2019-02-23 NOTE — Telephone Encounter (Signed)
Refill faxed

## 2019-03-08 ENCOUNTER — Other Ambulatory Visit: Payer: Self-pay | Admitting: Internal Medicine

## 2019-03-08 DIAGNOSIS — Z170 Estrogen receptor positive status [ER+]: Secondary | ICD-10-CM

## 2019-03-08 DIAGNOSIS — C50111 Malignant neoplasm of central portion of right female breast: Secondary | ICD-10-CM

## 2019-03-24 ENCOUNTER — Inpatient Hospital Stay: Payer: Medicare Other | Attending: Internal Medicine | Admitting: Internal Medicine

## 2019-03-24 ENCOUNTER — Inpatient Hospital Stay: Payer: Medicare Other

## 2019-03-24 ENCOUNTER — Encounter: Payer: Self-pay | Admitting: Internal Medicine

## 2019-03-24 ENCOUNTER — Other Ambulatory Visit: Payer: Self-pay

## 2019-03-24 VITALS — BP 171/57 | HR 65 | Temp 98.5°F | Resp 20 | Ht 66.0 in | Wt 132.2 lb

## 2019-03-24 DIAGNOSIS — C50919 Malignant neoplasm of unspecified site of unspecified female breast: Secondary | ICD-10-CM

## 2019-03-24 DIAGNOSIS — Z9013 Acquired absence of bilateral breasts and nipples: Secondary | ICD-10-CM

## 2019-03-24 DIAGNOSIS — Z8042 Family history of malignant neoplasm of prostate: Secondary | ICD-10-CM | POA: Insufficient documentation

## 2019-03-24 DIAGNOSIS — C3412 Malignant neoplasm of upper lobe, left bronchus or lung: Secondary | ICD-10-CM | POA: Insufficient documentation

## 2019-03-24 DIAGNOSIS — Z5111 Encounter for antineoplastic chemotherapy: Secondary | ICD-10-CM

## 2019-03-24 DIAGNOSIS — Z9221 Personal history of antineoplastic chemotherapy: Secondary | ICD-10-CM | POA: Diagnosis not present

## 2019-03-24 DIAGNOSIS — Z79899 Other long term (current) drug therapy: Secondary | ICD-10-CM | POA: Diagnosis not present

## 2019-03-24 DIAGNOSIS — Z170 Estrogen receptor positive status [ER+]: Secondary | ICD-10-CM | POA: Insufficient documentation

## 2019-03-24 DIAGNOSIS — Z808 Family history of malignant neoplasm of other organs or systems: Secondary | ICD-10-CM | POA: Diagnosis not present

## 2019-03-24 DIAGNOSIS — Z85038 Personal history of other malignant neoplasm of large intestine: Secondary | ICD-10-CM | POA: Diagnosis not present

## 2019-03-24 DIAGNOSIS — Z800 Family history of malignant neoplasm of digestive organs: Secondary | ICD-10-CM | POA: Insufficient documentation

## 2019-03-24 DIAGNOSIS — Z803 Family history of malignant neoplasm of breast: Secondary | ICD-10-CM | POA: Insufficient documentation

## 2019-03-24 DIAGNOSIS — Z79811 Long term (current) use of aromatase inhibitors: Secondary | ICD-10-CM | POA: Insufficient documentation

## 2019-03-24 DIAGNOSIS — I10 Essential (primary) hypertension: Secondary | ICD-10-CM | POA: Insufficient documentation

## 2019-03-24 DIAGNOSIS — C182 Malignant neoplasm of ascending colon: Secondary | ICD-10-CM

## 2019-03-24 DIAGNOSIS — C3490 Malignant neoplasm of unspecified part of unspecified bronchus or lung: Secondary | ICD-10-CM

## 2019-03-24 LAB — CBC WITH DIFFERENTIAL (CANCER CENTER ONLY)
Abs Immature Granulocytes: 0.01 10*3/uL (ref 0.00–0.07)
Basophils Absolute: 0 10*3/uL (ref 0.0–0.1)
Basophils Relative: 1 %
Eosinophils Absolute: 0.2 10*3/uL (ref 0.0–0.5)
Eosinophils Relative: 4 %
HCT: 36.5 % (ref 36.0–46.0)
Hemoglobin: 11.4 g/dL — ABNORMAL LOW (ref 12.0–15.0)
Immature Granulocytes: 0 %
Lymphocytes Relative: 19 %
Lymphs Abs: 0.9 10*3/uL (ref 0.7–4.0)
MCH: 29.4 pg (ref 26.0–34.0)
MCHC: 31.2 g/dL (ref 30.0–36.0)
MCV: 94.1 fL (ref 80.0–100.0)
Monocytes Absolute: 0.7 10*3/uL (ref 0.1–1.0)
Monocytes Relative: 16 %
Neutro Abs: 2.6 10*3/uL (ref 1.7–7.7)
Neutrophils Relative %: 60 %
Platelet Count: 214 10*3/uL (ref 150–400)
RBC: 3.88 MIL/uL (ref 3.87–5.11)
RDW: 13.5 % (ref 11.5–15.5)
WBC Count: 4.4 10*3/uL (ref 4.0–10.5)
nRBC: 0 % (ref 0.0–0.2)

## 2019-03-24 LAB — CMP (CANCER CENTER ONLY)
ALT: 12 U/L (ref 0–44)
AST: 16 U/L (ref 15–41)
Albumin: 3.8 g/dL (ref 3.5–5.0)
Alkaline Phosphatase: 92 U/L (ref 38–126)
Anion gap: 7 (ref 5–15)
BUN: 21 mg/dL (ref 8–23)
CO2: 25 mmol/L (ref 22–32)
Calcium: 9.8 mg/dL (ref 8.9–10.3)
Chloride: 108 mmol/L (ref 98–111)
Creatinine: 1.15 mg/dL — ABNORMAL HIGH (ref 0.44–1.00)
GFR, Est AFR Am: 52 mL/min — ABNORMAL LOW (ref >60)
GFR, Estimated: 45 mL/min — ABNORMAL LOW (ref >60)
Glucose, Bld: 101 mg/dL — ABNORMAL HIGH (ref 70–99)
Potassium: 4.8 mmol/L (ref 3.5–5.1)
Sodium: 140 mmol/L (ref 135–145)
Total Bilirubin: 0.5 mg/dL (ref 0.3–1.2)
Total Protein: 6.8 g/dL (ref 6.5–8.1)

## 2019-03-24 MED ORDER — CLONIDINE HCL 0.1 MG PO TABS
0.2000 mg | ORAL_TABLET | Freq: Once | ORAL | Status: AC
  Administered 2019-03-24: 0.2 mg via ORAL

## 2019-03-24 MED ORDER — CLONIDINE HCL 0.1 MG PO TABS
ORAL_TABLET | ORAL | Status: AC
  Filled 2019-03-24: qty 2

## 2019-03-24 NOTE — Progress Notes (Signed)
[name]   Fax:(336) [phone]  OFFICE PROGRESS NOTE  Eber Hong, MD [address] 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 2) adenocarcinoma of the ascending colon (T2, N0, M0) with MSI high diagnosed in March 2017. 3) history of breast adenocarcinoma.  PRIOR THERAPY:  1) Tarceva 150 mg by mouth daily status post 68 months of treatment. 2) Status post right colectomy with lymph node dissection in May 2017.  CURRENT THERAPY: 1) Tagrisso 80 mg by mouth daily started 11/09/2016.  Status post 24 months of treatment. 2) observation for the history of colon adenocarcinoma. 3) Femara 2.5 mg by mouth daily for history of breast cancer.  INTERVAL HISTORY: Kristin [name] 78 y.o. female returns to the clinic today for follow-up visit.  The patient is feeling fine today with no concerning complaints except for stress after her son was recently diagnosed with metastatic pancreatic cancer.  Her blood pressure is elevated today and she was not taking any of her blood pressure medication since discharge from the hospital.  She denied having any chest pain, shortness breath, cough or hemoptysis.  She denied having any fever or chills.  She has no nausea, vomiting, diarrhea or constipation.  She has no headache or visual changes.  She is here today for evaluation and repeat blood work.   MEDICAL HISTORY: Past Medical History:  Diagnosis Date  . Allergy   . Anemia   . Anemia of chronic disease 09/21/2016  . Anxiety   . Arthritis    arthritis- left hip  . Blood transfusion without reported diagnosis    transfusion- 3-4 yrs ago -found to be anemic on routine lab check  . Breast cancer ([place]) 2002   bilateral- bilateral mastectomies done.  . Clinical  depression 12/02/2000  . CVA (cerebral infarction) 04/30/2013  . Encounter for antineoplastic chemotherapy 10/28/2016  . Family history of brain cancer   . Family history of breast cancer   . Family history of colon cancer 10/06/2009  . Family history of colon cancer   . Family history of prostate cancer   . GERD (gastroesophageal reflux disease)   . Glaucoma   . History of bilateral mastectomy 05/20/2010  . Hypertension   . Lung cancer ([place]) dx'd 12/2010   last Ct scan "no lung cancer" showing 11'16 CT Chest Epic.  . Malignant neoplasm of ascending colon ([place]) 01/29/2016  . Stroke [facility])    2 yrs ago-no residual  . Tubular adenoma of colon 07/2011   colon polyps ans reoccurence with malignancy found    ALLERGIES:  is allergic to doxycycline and sulfa antibiotics.  MEDICATIONS:  Current Outpatient Medications  Medication Sig Dispense Refill  . alendronate (FOSAMAX) 70 MG tablet Take 70 mg once a week by mouth.    Marland Kitchen amLODipine (NORVASC) 5 MG tablet Take 1 tablet by mouth as directed. take one tablet in am and 1/2 tablet in pm-    . brimonidine-timolol (COMBIGAN) 0.2-0.5 % ophthalmic solution Place 1 drop into the right eye every 12 (twelve) hours.    . Cholecalciferol (VITAMIN D3) 2000 units capsule Take 2,000 Units daily by mouth.    . clopidogrel (PLAVIX) 75 MG tablet Take 75 mg by mouth daily.    . cycloSPORINE (RESTASIS) 0.05 % ophthalmic emulsion Place 1 drop into both  eyes 2 (two) times daily.    Marland Kitchen escitalopram (LEXAPRO) 10 MG tablet Take 10 mg by mouth daily.     . famotidine (PEPCID) 40 MG tablet Take 40 mg by mouth daily.    . feeding supplement, ENSURE ENLIVE, (ENSURE ENLIVE) LIQD Take 237 mLs by mouth 2 (two) times daily between meals. 237 mL 12  . FeFum-FePoly-FA-B Cmp-C-Biot (FOLIVANE-PLUS) CAPS TAKE 1 CAPSULE BY MOUTH EVERY DAY IN THE MORNING 30 capsule 2  . letrozole (FEMARA) 2.5 MG tablet TAKE 1 TABLET BY MOUTH EVERY DAY 90 tablet 1  . methimazole (TAPAZOLE) 10 MG tablet      . Multiple Vitamins-Iron (MULTIVITAMINS WITH IRON) TABS tablet Take 1 tablet by mouth daily.  0  . osimertinib mesylate (TAGRISSO) 80 MG tablet Take 1 tablet (80 mg total) by mouth daily. 30 tablet 3   Current Facility-Administered Medications  Medication Dose Route Frequency Provider Last Rate Last Dose  . 0.9 %  sodium chloride infusion  500 mL Intravenous Continuous Armbruster, Carlota Raspberry, MD        SURGICAL HISTORY:  Past Surgical History:  Procedure Laterality Date  . APPENDECTOMY    . BOWEL DECOMPRESSION N/A 06/12/2018   Procedure: BOWEL DECOMPRESSION;  Surgeon: Rush Landmark Telford Nab., MD;  Location: [facility];  Service: Gastroenterology;  Laterality: N/A;  . cataracts Bilateral   . COLECTOMY WITH COLOSTOMY CREATION/HARTMANN PROCEDURE  06/16/2018   Procedure: CREATION/HARTMANN COLOSTOMY;  Surgeon: Michael Boston, MD;  Location: [facility];  Service: General;;  . COLONOSCOPY    . COLOSTOMY N/A 06/16/2018   Procedure: POSSIBLE COLOSTOMY;  Surgeon: Michael Boston, MD;  Location: [facility];  Service: General;  Laterality: N/A;  . EYE SURGERY    . FLEXIBLE SIGMOIDOSCOPY N/A 06/12/2018   Procedure: FLEXIBLE SIGMOIDOSCOPY;  Surgeon: Rush Landmark Telford Nab., MD;  Location: [facility];  Service: Gastroenterology;  Laterality: N/A;  . LAPAROSCOPIC SIGMOID COLECTOMY N/A 06/16/2018   Procedure: LAPAROSCOPIC LOW ANTERIOR RESECTION;  Surgeon: Michael Boston, MD;  Location: [facility];  Service: General;  Laterality: N/A;  . LUNG SURGERY     Resection -" not done. Pt . tx with Tarceva  . MASTECTOMY Bilateral   . MEDIASTINOSCOPY N/A 10/02/2016   Procedure: MEDIASTINOSCOPY;  Surgeon: [name], MD;  Location: [facility] OR;  Service: Thoracic;  Laterality: N/A;  . RECONSTRUCTION BREAST W/ TRAM FLAP Bilateral    bilaterally  . robotic right hemicolectomy Right 03/06/2016   Dr. Johney Maine  . TEE WITHOUT CARDIOVERSION N/A 06/04/2013   Procedure: TRANSESOPHAGEAL ECHOCARDIOGRAM (TEE);  Surgeon: Jolaine Artist, MD;  Location: [facility] ENDOSCOPY;  Service: Cardiovascular;  Laterality: N/A;  . TONSILLECTOMY    . TOTAL HIP ARTHROPLASTY Left 08/07/2016   Procedure: LEFT TOTAL HIP ARTHROPLASTY ANTERIOR APPROACH;  Surgeon: Gaynelle Arabian, MD;  Location: [facility];  Service: Orthopedics;  Laterality: Left;    REVIEW OF SYSTEMS:  A comprehensive review of systems was negative except for: Constitutional: positive for fatigue Musculoskeletal: positive for arthralgias   PHYSICAL EXAMINATION: General appearance: alert, cooperative, fatigued and no distress Head: Normocephalic, without obvious abnormality, atraumatic Neck: no adenopathy, no JVD, supple, symmetrical, trachea midline and thyroid not enlarged, symmetric, no tenderness/mass/nodules Lymph nodes: Cervical, supraclavicular, and axillary nodes normal. Resp: clear to auscultation bilaterally Back: symmetric, no curvature. ROM normal. No CVA tenderness. Cardio: regular rate and rhythm, S1, S2 normal, no murmur, click, rub or gallop GI: soft, non-tender; bowel sounds normal; no masses,  no organomegaly Extremities: extremities normal, atraumatic, no cyanosis or edema  ECOG  PERFORMANCE STATUS: 1 - Symptomatic but completely ambulatory  Blood pressure (!) 171/57, pulse 65, temperature 98.5 F (36.9 C), temperature source Oral, resp. rate 20, height 5' 6"  (1.676 m), weight 132 lb 3.2 oz (60 kg), SpO2 100 %.  LABORATORY DATA: Lab Results  Component Value Date   WBC 4.4 03/24/2019   HGB 11.4 (L) 03/24/2019   HCT 36.5 03/24/2019   MCV 94.1 03/24/2019   PLT 214 03/24/2019      Chemistry      Component Value Date/Time   NA 140 03/24/2019 0913   NA 138 09/26/2017 0942   K 4.8 03/24/2019 0913   K 5.2 No visable hemolysis (H) 09/26/2017 0942   CL 108 03/24/2019 0913   CL 107 04/05/2013 0754   CO2 25 03/24/2019 0913   CO2 26 09/26/2017 0942   BUN 21 03/24/2019 0913   BUN 16.0 09/26/2017 0942   CREATININE 1.15 (H) 03/24/2019 0913   CREATININE  1.2 (H) 09/26/2017 0942      Component Value Date/Time   CALCIUM 9.8 03/24/2019 0913   CALCIUM 9.9 09/26/2017 0942   ALKPHOS 92 03/24/2019 0913   ALKPHOS 68 09/26/2017 0942   AST 16 03/24/2019 0913   AST 18 09/26/2017 0942   ALT 12 03/24/2019 0913   ALT 13 09/26/2017 0942   BILITOT 0.5 03/24/2019 0913   BILITOT 0.31 09/26/2017 0942       RADIOGRAPHIC STUDIES: No results found.  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 The patient is currently on treatment with Tagrisso 80 mg by mouth daily status post 24 months.  The patient has been tolerating this treatment well. I recommended for her to continue her current treatment with Tagrisso with the same dose. For the hypertension I gave the patient a dose of clonidine 0.2 mg p.o. x1 in the clinic and she was advised to resume her blood pressure medication and to discuss with her primary care physician for adjustment of medication if needed. For the anemia, I recommended for the patient to continue on oral iron tablets and I gave her prescription for Integra +1 capsule p.o. daily. For the history of breast cancer, she will continue on Femara. For the history of colon cancer, she has no evidence for disease recurrence, she will continue on observation for now. I will see her back for follow-up visit in 1 months for evaluation after repeating CT scan of the chest, abdomen and pelvis for restaging of her disease. The patient was advised to call immediately if she has any concerning symptoms in the interval. The patient voices understanding of current disease status and treatment options and is in agreement with the current care plan. All questions were answered.  The patient knows to call the clinic with any problems, questions or concerns. We can certainly see the patient much sooner if necessary.  Disclaimer: This note was dictated with voice recognition software. Similar sounding words can inadvertently be transcribed and may not be corrected upon review.            [place] Telephone:(336) [phone]   Fax:(336) [phone]  OFFICE PROGRESS NOTE  Eber Hong, MD [address] [place] [address]  DIAGNOSIS:  1) metastatic non-small cell lung cancer, adenocarcinoma diagnosed in  March 2012 with positive EGFR mutation in exon 21 (L858R). 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 2) adenocarcinoma of the ascending colon (T2, N0, M0) with MSI high diagnosed in March 2017. 3) history of breast adenocarcinoma.  PRIOR THERAPY:  1) Tarceva 150 mg by mouth daily status post 68 months of treatment. 2) Status post right colectomy with lymph node dissection in May 2017.  CURRENT THERAPY: 1) Tagrisso 80 mg by mouth daily started 11/09/2016.  Status post 25 months of treatment. 2) observation for the history of colon adenocarcinoma. 3) Femara 2.5 mg by mouth daily for history of breast cancer.  INTERVAL HISTORY: Kristin [name] 78 y.o. female returns to the clinic today for follow-up visit.  The patient is feeling fine today with no concerning complaints.  Unfortunately she lost her son to pancreatic cancer a month ago.  She denied having any current chest pain, shortness of breath, cough or hemoptysis.  She has no fever or chills.  She denied having any headache or visual changes.  She lost few pounds since her last visit.  She is here today for evaluation and repeat blood work.  MEDICAL HISTORY: Past Medical History:  Diagnosis Date  . Allergy   . Anemia   . Anemia of chronic disease 09/21/2016  . Anxiety   . Arthritis    arthritis- left hip  . Blood transfusion without reported diagnosis     transfusion- 3-4 yrs ago -found to be anemic on routine lab check  . Breast cancer ([place]) 2002   bilateral- bilateral mastectomies done.  . 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 CVA (cerebral infarction) 04/30/2013  . Encounter for antineoplastic chemotherapy 10/28/2016  . Family history of brain cancer   . Family history of breast cancer   . Family history of colon cancer 10/06/2009  . Family history of colon cancer   . Family history of prostate cancer   . GERD (gastroesophageal reflux disease)   . Glaucoma   . History of bilateral mastectomy 05/20/2010  . Hypertension   . Lung cancer ([place]) dx'd 12/2010   last Ct scan "no lung cancer" showing 11'16 CT Chest Epic.  . Malignant neoplasm of ascending colon ([place]) 01/29/2016  . Stroke [facility])    2 yrs ago-no residual  . Tubular adenoma of colon 07/2011   colon polyps ans reoccurence with malignancy found    ALLERGIES:  is allergic to doxycycline and sulfa antibiotics.  MEDICATIONS:  Current Outpatient Medications  Medication Sig Dispense Refill  . alendronate (FOSAMAX) 70 MG tablet Take 70 mg once a week by mouth.    Marland Kitchen amLODipine (NORVASC) 5 MG tablet Take 1 tablet by mouth as directed. take one tablet in am and 1/2 tablet in pm-    . brimonidine-timolol (COMBIGAN) 0.2-0.5 % ophthalmic solution Place 1 drop into the right eye every 12 (twelve) hours.    . Cholecalciferol (VITAMIN D3) 2000 units capsule Take 2,000 Units daily by mouth.    . clopidogrel (PLAVIX) 75 MG tablet Take 75 mg by mouth daily.    . cycloSPORINE (RESTASIS) 0.05 % ophthalmic emulsion Place 1 drop into both eyes 2 (two) times daily.    Marland Kitchen escitalopram (LEXAPRO) 10 MG tablet Take 10 mg by mouth daily.     . famotidine (PEPCID) 40 MG tablet Take 40 mg by mouth daily.    . feeding supplement, ENSURE ENLIVE, (ENSURE ENLIVE) LIQD Take 237 mLs by mouth 2 (two) times daily between meals. 237 mL 12  .  FeFum-FePoly-FA-B Cmp-C-Biot (FOLIVANE-PLUS) CAPS TAKE 1 CAPSULE BY MOUTH EVERY DAY  IN THE MORNING 30 capsule 2  . letrozole (FEMARA) 2.5 MG tablet TAKE 1 TABLET BY MOUTH EVERY DAY 90 tablet 1  . methimazole (TAPAZOLE) 10 MG tablet     . Multiple Vitamins-Iron (MULTIVITAMINS WITH IRON) TABS tablet Take 1 tablet by mouth daily.  0  . osimertinib mesylate (TAGRISSO) 80 MG tablet Take 1 tablet (80 mg total) by mouth daily. 30 tablet 3   Current Facility-Administered Medications  Medication Dose Route Frequency Provider Last Rate Last Dose  . 0.9 %  sodium chloride infusion  500 mL Intravenous Continuous Armbruster, Carlota Raspberry, MD        SURGICAL HISTORY:  Past Surgical History:  Procedure Laterality Date  . APPENDECTOMY    . BOWEL DECOMPRESSION N/A 06/12/2018   Procedure: BOWEL DECOMPRESSION;  Surgeon: Rush Landmark Telford Nab., MD;  Location: [facility];  Service: Gastroenterology;  Laterality: N/A;  . cataracts Bilateral   . COLECTOMY WITH COLOSTOMY CREATION/HARTMANN PROCEDURE  06/16/2018   Procedure: CREATION/HARTMANN COLOSTOMY;  Surgeon: Michael Boston, MD;  [facility]: [facility];  Service: General;;  . COLONOSCOPY    . COLOSTOMY N/A 06/16/2018   Procedure: POSSIBLE COLOSTOMY;  Surgeon: Michael Boston, MD;  Location: [facility];  Service: General;  Laterality: N/A;  . EYE SURGERY    . FLEXIBLE SIGMOIDOSCOPY N/A 06/12/2018   Procedure: FLEXIBLE SIGMOIDOSCOPY;  Surgeon: Rush Landmark Telford Nab., MD;  Location: [facility];  Service: Gastroenterology;  Laterality: N/A;  . LAPAROSCOPIC SIGMOID COLECTOMY N/A 06/16/2018   Procedure: LAPAROSCOPIC LOW ANTERIOR RESECTION;  Surgeon: Michael Boston, MD;  Location: [facility];  Service: General;  Laterality: N/A;  . LUNG SURGERY     Resection -" not done. Pt . tx with Tarceva  . MASTECTOMY Bilateral   . MEDIASTINOSCOPY N/A 10/02/2016   Procedure: MEDIASTINOSCOPY;  Surgeon: [name], MD;  Location: [facility] OR;  Service: Thoracic;  Laterality: N/A;  . RECONSTRUCTION BREAST W/ TRAM FLAP Bilateral    bilaterally  . robotic right hemicolectomy  Right 03/06/2016   Dr. Johney Maine  . TEE WITHOUT CARDIOVERSION N/A 06/04/2013   Procedure: TRANSESOPHAGEAL ECHOCARDIOGRAM (TEE);  Surgeon: Jolaine Artist, MD;  Location: [facility] ENDOSCOPY;  Service: Cardiovascular;  Laterality: N/A;  . TONSILLECTOMY    . TOTAL HIP ARTHROPLASTY Left 08/07/2016   Procedure: LEFT TOTAL HIP ARTHROPLASTY ANTERIOR APPROACH;  Surgeon: Gaynelle Arabian, MD;  [facility];  Service: Orthopedics;  Laterality: Left;    REVIEW OF SYSTEMS:  A comprehensive review of systems was negative except for: Constitutional: positive for fatigue   PHYSICAL EXAMINATION: General appearance: alert, cooperative, fatigued and no distress Head: Normocephalic, without obvious abnormality, atraumatic Neck: no adenopathy, no JVD, supple, symmetrical, trachea midline and thyroid not enlarged, symmetric, no tenderness/mass/nodules Lymph nodes: Cervical, supraclavicular, and axillary nodes normal. Resp: clear to auscultation bilaterally Back: symmetric, no curvature. ROM normal. No CVA tenderness. Cardio: regular rate and rhythm, S1, S2 normal, no murmur, click, rub or gallop GI: soft, non-tender; bowel sounds normal; no masses,  no organomegaly Extremities: extremities normal, atraumatic, no cyanosis or edema  ECOG PERFORMANCE STATUS: 1 - Symptomatic but completely ambulatory  Blood pressure (!) 171/57, pulse 65, temperature 98.5 F (36.9 C), temperature source Oral, resp. rate 20, height 5' 6"  (1.676 m), weight 132 lb 3.2 oz (60 kg), SpO2 100 %.  LABORATORY DATA: Lab Results  Component Value Date   WBC 4.4 03/24/2019   HGB 11.4 (L) 03/24/2019   HCT 36.5 03/24/2019  MCV 94.1 03/24/2019   PLT 214 03/24/2019      Chemistry      Component Value Date/Time   NA 140 03/24/2019 0913   NA 138 09/26/2017 0942   K 4.8 03/24/2019 0913   K 5.2 No visable hemolysis (H) 09/26/2017 0942   CL 108 03/24/2019 0913   CL 107 04/05/2013 0754   CO2 25 03/24/2019 0913   CO2 26 09/26/2017 0942   BUN  21 03/24/2019 0913   BUN 16.0 09/26/2017 0942   CREATININE 1.15 (H) 03/24/2019 0913   CREATININE 1.2 (H) 09/26/2017 0942      Component Value Date/Time   CALCIUM 9.8 03/24/2019 0913   CALCIUM 9.9 09/26/2017 0942   ALKPHOS 92 03/24/2019 0913   ALKPHOS 68 09/26/2017 0942   AST 16 03/24/2019 0913   AST 18 09/26/2017 0942   ALT 12 03/24/2019 0913   ALT 13 09/26/2017 0942   BILITOT 0.5 03/24/2019 0913   BILITOT 0.31 09/26/2017 0942       RADIOGRAPHIC STUDIES: No results found.  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 The patient is currently on treatment with Tagrisso 80 mg by mouth daily status post 28 months.  The patient continues to tolerate her treatment with Tagrisso fairly well. I recommended for her to continue her current treatment with the same dose. I will see her back for follow-up visit in 1 months with repeat CT scan of the chest, abdomen and pelvis for restaging of her disease. For the anemia, I recommended for the patient to continue on oral iron tablets and I gave her prescription for Integra +1 capsule p.o. daily. For the history of breast cancer, she will continue on Femara. For the history of colon cancer, she has no evidence for disease recurrence, she will continue on observation for now. For hypertension, we will give the patient a dose of clonidine today and she will continue to monitor her blood pressure closely at home and discussed with her primary care physician for adjustment of her medication if needed. The patient was advised to call immediately if she has any concerning symptoms in the interval. The patient voices understanding of current disease status and treatment options  and is in agreement with the current care plan. All questions were answered. The patient knows to call the clinic with any problems, questions or concerns. We can certainly see the patient much sooner if necessary.  Disclaimer: This note was dictated with voice recognition software. Similar sounding words can inadvertently be transcribed and may not be corrected upon review.

## 2019-03-25 ENCOUNTER — Telehealth: Payer: Self-pay | Admitting: Internal Medicine

## 2019-03-25 NOTE — Telephone Encounter (Signed)
Scheduled appt per 6/3 los - pt to get a reminder letter in the mail.

## 2019-04-22 ENCOUNTER — Telehealth: Payer: Self-pay | Admitting: Internal Medicine

## 2019-04-22 ENCOUNTER — Ambulatory Visit (HOSPITAL_COMMUNITY)
Admission: RE | Admit: 2019-04-22 | Discharge: 2019-04-22 | Disposition: A | Discharge status: Home / Self Care | Payer: Medicare Other | Source: Ambulatory Visit | Attending: Internal Medicine | Admitting: Internal Medicine

## 2019-04-22 ENCOUNTER — Other Ambulatory Visit: Payer: Self-pay

## 2019-04-22 DIAGNOSIS — C3490 Malignant neoplasm of unspecified part of unspecified bronchus or lung: Secondary | ICD-10-CM | POA: Insufficient documentation

## 2019-04-22 MED ORDER — IOHEXOL 300 MG/ML  SOLN
75.0000 mL | Freq: Once | INTRAMUSCULAR | Status: AC | PRN
  Administered 2019-04-22: 75 mL via INTRAVENOUS

## 2019-04-22 MED ORDER — SODIUM CHLORIDE (PF) 0.9 % IJ SOLN
INTRAMUSCULAR | Status: AC
  Filled 2019-04-22: qty 50

## 2019-04-22 NOTE — Telephone Encounter (Signed)
Spoke with patient re 7/14 lab/fu. Per patient appointments moved from 7/6 (lab) and 7/8 [facility]) to 7/14 due to patient out of town. Patient has already had ct. Confirmed with patient.

## 2019-04-26 ENCOUNTER — Inpatient Hospital Stay: Payer: Medicare Other

## 2019-04-28 ENCOUNTER — Inpatient Hospital Stay: Payer: Medicare Other | Admitting: Physician Assistant

## 2019-05-04 ENCOUNTER — Inpatient Hospital Stay (HOSPITAL_BASED_OUTPATIENT_CLINIC_OR_DEPARTMENT_OTHER): Payer: Medicare Other | Admitting: Physician Assistant

## 2019-05-04 ENCOUNTER — Other Ambulatory Visit: Payer: Self-pay

## 2019-05-04 ENCOUNTER — Telehealth: Payer: Self-pay | Admitting: Internal Medicine

## 2019-05-04 ENCOUNTER — Encounter: Payer: Self-pay | Admitting: Physician Assistant

## 2019-05-04 ENCOUNTER — Inpatient Hospital Stay: Payer: Medicare Other | Attending: Internal Medicine

## 2019-05-04 VITALS — BP 140/80 | HR 62 | Temp 98.7°F | Resp 17 | Ht 66.0 in | Wt 135.2 lb

## 2019-05-04 DIAGNOSIS — Z800 Family history of malignant neoplasm of digestive organs: Secondary | ICD-10-CM | POA: Diagnosis not present

## 2019-05-04 DIAGNOSIS — C50919 Malignant neoplasm of unspecified site of unspecified female breast: Secondary | ICD-10-CM

## 2019-05-04 DIAGNOSIS — C3412 Malignant neoplasm of upper lobe, left bronchus or lung: Secondary | ICD-10-CM | POA: Insufficient documentation

## 2019-05-04 DIAGNOSIS — Z79811 Long term (current) use of aromatase inhibitors: Secondary | ICD-10-CM | POA: Insufficient documentation

## 2019-05-04 DIAGNOSIS — Z8042 Family history of malignant neoplasm of prostate: Secondary | ICD-10-CM | POA: Insufficient documentation

## 2019-05-04 DIAGNOSIS — Z803 Family history of malignant neoplasm of breast: Secondary | ICD-10-CM

## 2019-05-04 DIAGNOSIS — Z170 Estrogen receptor positive status [ER+]: Secondary | ICD-10-CM

## 2019-05-04 DIAGNOSIS — Z9013 Acquired absence of bilateral breasts and nipples: Secondary | ICD-10-CM | POA: Insufficient documentation

## 2019-05-04 DIAGNOSIS — Z5111 Encounter for antineoplastic chemotherapy: Secondary | ICD-10-CM

## 2019-05-04 DIAGNOSIS — Z8673 Personal history of transient ischemic attack (TIA), and cerebral infarction without residual deficits: Secondary | ICD-10-CM | POA: Insufficient documentation

## 2019-05-04 DIAGNOSIS — Z808 Family history of malignant neoplasm of other organs or systems: Secondary | ICD-10-CM

## 2019-05-04 DIAGNOSIS — Z79899 Other long term (current) drug therapy: Secondary | ICD-10-CM | POA: Diagnosis not present

## 2019-05-04 DIAGNOSIS — Z85038 Personal history of other malignant neoplasm of large intestine: Secondary | ICD-10-CM | POA: Insufficient documentation

## 2019-05-04 DIAGNOSIS — C50111 Malignant neoplasm of central portion of right female breast: Secondary | ICD-10-CM

## 2019-05-04 DIAGNOSIS — I10 Essential (primary) hypertension: Secondary | ICD-10-CM | POA: Insufficient documentation

## 2019-05-04 DIAGNOSIS — C3492 Malignant neoplasm of unspecified part of left bronchus or lung: Secondary | ICD-10-CM

## 2019-05-04 DIAGNOSIS — C182 Malignant neoplasm of ascending colon: Secondary | ICD-10-CM

## 2019-05-04 DIAGNOSIS — C3490 Malignant neoplasm of unspecified part of unspecified bronchus or lung: Secondary | ICD-10-CM

## 2019-05-04 DIAGNOSIS — Z933 Colostomy status: Secondary | ICD-10-CM

## 2019-05-04 LAB — CMP (CANCER CENTER ONLY)
ALT: 13 U/L (ref 0–44)
AST: 16 U/L (ref 15–41)
Albumin: 3.5 g/dL (ref 3.5–5.0)
Alkaline Phosphatase: 81 U/L (ref 38–126)
Anion gap: 7 (ref 5–15)
BUN: 19 mg/dL (ref 8–23)
CO2: 27 mmol/L (ref 22–32)
Calcium: 9.3 mg/dL (ref 8.9–10.3)
Chloride: 107 mmol/L (ref 98–111)
Creatinine: 1.16 mg/dL — ABNORMAL HIGH (ref 0.44–1.00)
GFR, Est AFR Am: 51 mL/min — ABNORMAL LOW (ref >60)
GFR, Estimated: 44 mL/min — ABNORMAL LOW (ref >60)
Glucose, Bld: 89 mg/dL (ref 70–99)
Potassium: 4.3 mmol/L (ref 3.5–5.1)
Sodium: 141 mmol/L (ref 135–145)
Total Bilirubin: 0.3 mg/dL (ref 0.3–1.2)
Total Protein: 6.2 g/dL — ABNORMAL LOW (ref 6.5–8.1)

## 2019-05-04 LAB — CBC WITH DIFFERENTIAL (CANCER CENTER ONLY)
Abs Immature Granulocytes: 0.01 10*3/uL (ref 0.00–0.07)
Basophils Absolute: 0 10*3/uL (ref 0.0–0.1)
Basophils Relative: 1 %
Eosinophils Absolute: 0.2 10*3/uL (ref 0.0–0.5)
Eosinophils Relative: 4 %
HCT: 31.9 % — ABNORMAL LOW (ref 36.0–46.0)
Hemoglobin: 10.2 g/dL — ABNORMAL LOW (ref 12.0–15.0)
Immature Granulocytes: 0 %
Lymphocytes Relative: 25 %
Lymphs Abs: 1.1 10*3/uL (ref 0.7–4.0)
MCH: 29.9 pg (ref 26.0–34.0)
MCHC: 32 g/dL (ref 30.0–36.0)
MCV: 93.5 fL (ref 80.0–100.0)
Monocytes Absolute: 0.7 10*3/uL (ref 0.1–1.0)
Monocytes Relative: 16 %
Neutro Abs: 2.3 10*3/uL (ref 1.7–7.7)
Neutrophils Relative %: 54 %
Platelet Count: 207 10*3/uL (ref 150–400)
RBC: 3.41 MIL/uL — ABNORMAL LOW (ref 3.87–5.11)
RDW: 14.2 % (ref 11.5–15.5)
WBC Count: 4.2 10*3/uL (ref 4.0–10.5)
nRBC: 0 % (ref 0.0–0.2)

## 2019-05-04 NOTE — Progress Notes (Signed)
[facility] OFF[facility] PROGRESS NOTE  Eber Hong, [place] [address]  DIAGNOSIS:  1) metastatic non-small cell lung cancer, adenocarcinoma diagnosed in March 2012 with positive EGFR mutation in exon 21 ([id number]). 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 2) adenocarcinoma of the ascending colon (T2, N0, M0) with MSI high diagnosed in March 2017. 3) history of breast adenocarcinoma.  PRIOR THERAPY:  1) Tarceva 150 mg by mouth daily status post 68 months of treatment. 2) Status post right colectomy with lymph node dissection in May 2017.  CURRENT THERAPY:  1) Tagrisso 80 mg by mouth daily started 11/09/2016.  Status post 29 months of treatment. 2) observation for the history of colon adenocarcinoma. 3) Femara 2.5 mg by mouth daily for history of breast cancer.  INTERVAL HISTORY: Kristin [name] 78 y.o. female returns to the clinic for a follow-up visit.  The patient is well today.  She continues to tolerate her treatment well without any adverse side effects.  She denies any fever, chills, night sweats, or weight loss.  She denies any chest pain, shortness of breath, cough, or hemoptysis.  She denies any nausea, vomiting, diarrhea, melena, hematochezia, or constipation.  She denies any headache or visual changes.  She denies any rashes or skin changes. She continues to take her iron supplement daily.  The patient recently had a restaging CT scan performed.  She is here today for evaluation and to review her scan results.  MEDICAL HISTORY: Past Medical History:  Diagnosis Date  . Allergy   . Anemia   . Anemia of chronic disease 09/21/2016  . Anxiety   . Arthritis    arthritis- left hip  . Blood transfusion without reported diagnosis    transfusion- 3-4 yrs ago -found to be anemic on routine lab check  . Breast cancer ([place]) 2002   bilateral- bilateral mastectomies done.  . 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 CVA (cerebral  infarction) 04/30/2013  . Encounter for antineoplastic chemotherapy 10/28/2016  . Family history of brain cancer   . Family history of breast cancer   . Family history of colon cancer 10/06/2009  . Family history of colon cancer   . Family history of prostate cancer   . GERD (gastroesophageal reflux disease)   . Glaucoma   . History of bilateral mastectomy 05/20/2010  . Hypertension   . Lung cancer ([place]) dx'd 12/2010   last Ct scan "no lung cancer" showing 11'16 CT Chest Epic.  . Malignant neoplasm of ascending colon ([place]) 01/29/2016  . Stroke [facility])    2 yrs ago-no residual  . Tubular adenoma of colon 07/2011   colon polyps ans reoccurence with malignancy found    ALLERGIES:  is allergic to doxycycline and sulfa antibiotics.  MEDICATIONS:  Current Outpatient Medications  Medication Sig Dispense Refill  . alendronate (FOSAMAX) 70 MG tablet Take 70 mg once a week by mouth.    Marland Kitchen amLODipine (NORVASC) 5 MG tablet Take 1 tablet by mouth as directed. take one tablet in am and 1/2 tablet in pm-    . brimonidine-timolol (COMBIGAN) 0.2-0.5 % ophthalmic solution Place 1 drop into the right eye every 12 (twelve) hours.    . Cholecalciferol (VITAMIN D3) 2000 units capsule Take 2,000 Units daily by mouth.    . clopidogrel (PLAVIX) 75 MG tablet Take 75 mg by mouth daily.    . cycloSPORINE (RESTASIS) 0.05 % ophthalmic emulsion Place 1 drop into both eyes 2 (two) times daily.    Marland Kitchen  escitalopram (LEXAPRO) 10 MG tablet Take 10 mg by mouth daily.     . famotidine (PEPCID) 40 MG tablet Take 40 mg by mouth daily.    . feeding supplement, ENSURE ENLIVE, (ENSURE ENLIVE) LIQD Take 237 mLs by mouth 2 (two) times daily between meals. 237 mL 12  . FeFum-FePoly-FA-B Cmp-C-Biot (FOLIVANE-PLUS) CAPS TAKE 1 CAPSULE BY MOUTH EVERY DAY IN THE MORNING 30 capsule 2  . letrozole (FEMARA) 2.5 MG tablet TAKE 1 TABLET BY MOUTH EVERY DAY 90 tablet 1  . methimazole (TAPAZOLE) 10 MG tablet     . Multiple Vitamins-Iron  (MULTIVITAMINS WITH IRON) TABS tablet Take 1 tablet by mouth daily.  0  . osimertinib mesylate (TAGRISSO) 80 MG tablet Take 1 tablet (80 mg total) by mouth daily. 30 tablet 3   Current Facility-Administered Medications  Medication Dose Route Frequency Provider Last Rate Last Dose  . 0.9 %  sodium chloride infusion  500 mL Intravenous Continuous Armbruster, Carlota Raspberry, MD        SURGICAL HISTORY:  Past Surgical History:  Procedure Laterality Date  . APPENDECTOMY    . BOWEL DECOMPRESSION N/A 06/12/2018   Procedure: BOWEL DECOMPRESSION;  Surgeon: Rush Landmark Telford Nab., MD;  Location: [facility];  Service: Gastroenterology;  Laterality: N/A;  . cataracts Bilateral   . COLECTOMY WITH COLOSTOMY CREATION/HARTMANN PROCEDURE  06/16/2018   Procedure: CREATION/HARTMANN COLOSTOMY;  Surgeon: Michael Boston, MD;  Location: [facility];  Service: General;;  . COLONOSCOPY    . COLOSTOMY N/A 06/16/2018   Procedure: POSSIBLE COLOSTOMY;  Surgeon: Michael Boston, MD;  Location: [facility];  Service: General;  Laterality: N/A;  . EYE SURGERY    . FLEXIBLE SIGMOIDOSCOPY N/A 06/12/2018   Procedure: FLEXIBLE SIGMOIDOSCOPY;  Surgeon: Rush Landmark Telford Nab., MD;  Location: [facility];  Service: Gastroenterology;  Laterality: N/A;  . LAPAROSCOPIC SIGMOID COLECTOMY N/A 06/16/2018   Procedure: LAPAROSCOPIC LOW ANTERIOR RESECTION;  Surgeon: Michael Boston, MD;  Location: [facility];  Service: General;  Laterality: N/A;  . LUNG SURGERY     Resection -" not done. Pt . tx with Tarceva  . MASTECTOMY Bilateral   . MEDIASTINOSCOPY N/A 10/02/2016   Procedure: MEDIASTINOSCOPY;  Surgeon: [name], MD;  Location[facility];  Service: Thoracic;  Laterality: N/A;  . RECONSTRUCTION BREAST W/ TRAM FLAP Bilateral    bilaterally  . robotic right hemicolectomy Right 03/06/2016   Dr. Johney Maine  . TEE WITHOUT CARDIOVERSION N/A 06/04/2013   Procedure: TRANSESOPHAGEAL ECHOCARDIOGRAM (TEE);  Surgeon: Jolaine Artist, MD;  Location: [facility]  ENDOSCOPY;  Service: Cardiovascular;  Laterality: N/A;  . TONSILLECTOMY    . TOTAL HIP ARTHROPLASTY Left 08/07/2016   Procedure: LEFT TOTAL HIP ARTHROPLASTY ANTERIOR APPROACH;  Surgeon: Gaynelle Arabian, MD;  Location: [facility];  Service: Orthopedics;  Laterality: Left;    REVIEW OF SYSTEMS:   Review of Systems  Constitutional: Negative for appetite change, chills, fatigue, fever and unexpected weight change.  HENT:   Negative for mouth sores, nosebleeds, sore throat and trouble swallowing.   Eyes: Negative for eye problems and icterus.  Respiratory: Negative for cough, hemoptysis, shortness of breath and wheezing.   Cardiovascular: Negative for chest pain and leg swelling.  Gastrointestinal: Negative for abdominal pain, constipation, diarrhea, nausea and vomiting.  Genitourinary: Negative for bladder incontinence, difficulty urinating, dysuria, frequency and hematuria.   Musculoskeletal: Negative for back pain, gait problem, neck pain and neck stiffness.  Skin: Negative for itching and rash.  Neurological: Negative for dizziness, extremity weakness, gait problem, headaches, light-headedness and  seizures.  Hematological: Negative for adenopathy. Does not bruise/bleed easily.  Psychiatric/Behavioral: Negative for confusion, depression and sleep disturbance. The patient is not nervous/anxious.     PHYSICAL EXAMINATION:  There were no vitals taken for this visit.  ECOG PERFORMANCE STATUS: 1 - Symptomatic but completely ambulatory  Physical Exam  Constitutional: Oriented to person, place, and time and well-developed, well-nourished, and in no distress.  HENT:  Head: Normocephalic and atraumatic.  Mouth/Throat: Oropharynx is clear and moist. No oropharyngeal exudate.  Eyes: Conjunctivae are normal. Right eye exhibits no discharge. Left eye exhibits no discharge. No scleral icterus.  Neck: Normal range of motion. Neck supple.  Cardiovascular: Normal rate, regular rhythm, normal heart sounds  and intact distal pulses.   Pulmonary/Chest: Effort normal and breath sounds normal. No respiratory distress. No wheezes. No rales.  Abdominal: Soft. Bowel sounds are normal. Exhibits no distension and no mass. There is no tenderness.  Musculoskeletal: Normal range of motion. Exhibits no edema.  Lymphadenopathy:    No cervical adenopathy.  Neurological: Alert and oriented to person, place, and time. Exhibits normal muscle tone. Gait normal. Coordination normal.  Skin: Skin is warm and dry. No rash noted. Not diaphoretic. No erythema. No pallor.  Psychiatric: Mood, memory and judgment normal.  Vitals reviewed.  LABORATORY DATA: Lab Results  Component Value Date   WBC 4.4 03/24/2019   HGB 11.4 (L) 03/24/2019   HCT 36.5 03/24/2019   MCV 94.1 03/24/2019   PLT 214 03/24/2019      Chemistry      Component Value Date/Time   NA 140 03/24/2019 0913   NA 138 09/26/2017 0942   K 4.8 03/24/2019 0913   K 5.2 No visable hemolysis (H) 09/26/2017 0942   CL 108 03/24/2019 0913   CL 107 04/05/2013 0754   CO2 25 03/24/2019 0913   CO2 26 09/26/2017 0942   BUN 21 03/24/2019 0913   BUN 16.0 09/26/2017 0942   CREATININE 1.15 (H) 03/24/2019 0913   CREATININE 1.2 (H) 09/26/2017 0942      Component Value Date/Time   CALCIUM 9.8 03/24/2019 0913   CALCIUM 9.9 09/26/2017 0942   ALKPHOS 92 03/24/2019 0913   ALKPHOS 68 09/26/2017 0942   AST 16 03/24/2019 0913   AST 18 09/26/2017 0942   ALT 12 03/24/2019 0913   ALT 13 09/26/2017 0942   BILITOT 0.5 03/24/2019 0913   BILITOT 0.31 09/26/2017 0942       RADIOGRAPHIC STUDIES:  Ct Chest W Contrast  Result Date: 04/22/2019 CLINICAL DATA:  Non-small cell lung cancer restaging. History of colon cancer and breast cancer. EXAM: CT CHEST, ABDOMEN, AND PELVIS WITH CONTRAST TECHNIQUE: Multidetector CT imaging of the chest, abdomen and pelvis was performed following the standard protocol during bolus administration of intravenous contrast. CONTRAST:  35m  OMNIPAQUE IOHEXOL 300 MG/ML  SOLN COMPARISON:  Multiple exams, including 12/07/2018 FINDINGS: CT CHEST FINDINGS Cardiovascular: Coronary, aortic arch, and branch vessel atherosclerotic vascular disease. Mediastinum/Nodes: No pathologic adenopathy. Moderate hiatal hernia. Lungs/Pleura: Along the pleural margin of the anterior mediastinum on images 24 through 31 of series 2 there is some increased plaque-like nodularity, measuring up to 6 mm in thickness on image 30/2, and previously only up to about 2-3 mm. Small medial pleural effusion at the left lung base is mildly enlarged from prior. Scarring related to wedge resection in the left upper lobe. Mild right middle lobe scarring, stable. Musculoskeletal: Degenerative glenohumeral arthropathy bilaterally. Mild deformity from healed left anterior second rib fracture. Mild sclerosis in  the left third rib laterally, unchanged. CT ABDOMEN PELVIS FINDINGS Hepatobiliary: Stable right hepatic lobe cyst. Upper normal gallbladder wall thickness. Otherwise unremarkable. No biliary dilatation. Pancreas: 0.9 by 0.4 cm fluid signal intensity structure along the uncinate process of the pancreas, previously 0.7 by 0.4 cm on 09/21/2014 and similar size on intervening exams, likely a small postinflammatory cyst. Size stability argues against a tiny indolent intraductal papillary mucinous neoplasm. Spleen: Unremarkable Adrenals/Urinary Tract: 2 small hypodense right renal lesions are technically too small to characterize but statistically likely to be cysts. Adrenal glands normal. Stomach/Bowel: Descending colostomy.  Rectal pouch unremarkable. Vascular/Lymphatic: Aortoiliac atherosclerotic vascular disease. No pathologic adenopathy. Reproductive: Calcified uterine fibroids, not appreciably changed. Other: No supplemental non-categorized findings. Musculoskeletal: Left total hip prosthesis. Grade 1 degenerative anterolisthesis at L3-4 and L4-5. Grade 1 degenerative retrolisthesis at  T12-L1 and L1-2. IMPRESSION: 1. Mild pleural thickening along the left anterior mediastinal margin measuring up to 6 mm, previously only about 3 mm. The possibility of early pleural recurrence is difficult to exclude in this setting. I am not certain this would be sensitively detected by PET-CT given the thin nature of the pleural thickening. Close surveillance by conventional CT is probably warranted. 2. Very small left medial basilar pleural effusion, nonspecific. 3. Other imaging findings of potential clinical significance: Aortic Atherosclerosis (ICD10-I70.0). Coronary atherosclerosis. Left upper lobe wedge resection with scarring. Sub-centimeter fluid signal intensity lesion along the uncinate process of the pancreas, roughly stable over the last 5 years, probably postinflammatory. Calcified uterine fibroids. Electronically Signed   By: Van Clines M.D.   On: 04/22/2019 12:10   Ct Abdomen Pelvis W Contrast  Result Date: 04/22/2019 CLINICAL DATA:  Non-small cell lung cancer restaging. History of colon cancer and breast cancer. EXAM: CT CHEST, ABDOMEN, AND PELVIS WITH CONTRAST TECHNIQUE: Multidetector CT imaging of the chest, abdomen and pelvis was performed following the standard protocol during bolus administration of intravenous contrast. CONTRAST:  36m OMNIPAQUE IOHEXOL 300 MG/ML  SOLN COMPARISON:  Multiple exams, including 12/07/2018 FINDINGS: CT CHEST FINDINGS Cardiovascular: Coronary, aortic arch, and branch vessel atherosclerotic vascular disease. Mediastinum/Nodes: No pathologic adenopathy. Moderate hiatal hernia. Lungs/Pleura: Along the pleural margin of the anterior mediastinum on images 24 through 31 of series 2 there is some increased plaque-like nodularity, measuring up to 6 mm in thickness on image 30/2, and previously only up to about 2-3 mm. Small medial pleural effusion at the left lung base is mildly enlarged from prior. Scarring related to wedge resection in the left upper lobe.  Mild right middle lobe scarring, stable. Musculoskeletal: Degenerative glenohumeral arthropathy bilaterally. Mild deformity from healed left anterior second rib fracture. Mild sclerosis in the left third rib laterally, unchanged. CT ABDOMEN PELVIS FINDINGS Hepatobiliary: Stable right hepatic lobe cyst. Upper normal gallbladder wall thickness. Otherwise unremarkable. No biliary dilatation. Pancreas: 0.9 by 0.4 cm fluid signal intensity structure along the uncinate process of the pancreas, previously 0.7 by 0.4 cm on 09/21/2014 and similar size on intervening exams, likely a small postinflammatory cyst. Size stability argues against a tiny indolent intraductal papillary mucinous neoplasm. Spleen: Unremarkable Adrenals/Urinary Tract: 2 small hypodense right renal lesions are technically too small to characterize but statistically likely to be cysts. Adrenal glands normal. Stomach/Bowel: Descending colostomy.  Rectal pouch unremarkable. Vascular/Lymphatic: Aortoiliac atherosclerotic vascular disease. No pathologic adenopathy. Reproductive: Calcified uterine fibroids, not appreciably changed. Other: No supplemental non-categorized findings. Musculoskeletal: Left total hip prosthesis. Grade 1 degenerative anterolisthesis at L3-4 and L4-5. Grade 1 degenerative retrolisthesis at T12-L1 and L1-2. IMPRESSION: 1.  Mild pleural thickening along the left anterior mediastinal margin measuring up to 6 mm, previously only about 3 mm. The possibility of early pleural recurrence is difficult to exclude in this setting. I am not certain this would be sensitively detected by PET-CT given the thin nature of the pleural thickening. Close surveillance by conventional CT is probably warranted. 2. Very small left medial basilar pleural effusion, nonspecific. 3. Other imaging findings of potential clinical significance: Aortic Atherosclerosis (ICD10-I70.0). Coronary atherosclerosis. Left upper lobe wedge resection with scarring.  Sub-centimeter fluid signal intensity lesion along the uncinate process of the pancreas, roughly stable over the last 5 years, probably postinflammatory. Calcified uterine fibroids. Electronically Signed   By: Van Clines M.D.   On: 04/22/2019 12:10     ASSESSMENT/PLAN:  This is a very pleasant 78 year old Caucasian female with metastatic non-small cell lung cancer, adenocarcinoma.  She was diagnosed in March 2012.  She has a positive EGFR mutation.  She is status post 68 months of treatment with Tarceva.  This was discontinued secondary to disease progression and  development of an EGFR T790M resistant mutation   She is currently undergoing treatment with Tagrisso 80 mg p.o. daily.  She is status post 29 months of treatment.  She has been tolerating treatment well without any adverse effects. The patient recently had a restaging CT scan performed.  Dr. Julien Nordmann personally independently reviewed the scan and discussed the results with the patient today.  The scan showed no evidence of disease progression. The scan did note some mild pleural thickening along the left anterior mediastinal margin. We will continue to monitor closely on future CT scans.  Dr. Julien Nordmann recommends that the patient continue on her current treatment with Tagrisso 80 mg p.o. daily.   I will see her back for follow-up visit 6 weeks for evaluation and routine labs.  Regarding the anemia, she will continue on oral iron tablets with Integra 1+ capsule p.o.  For her history of breast cancer, she will continue taking Femara 2.5 mg by mouth daily.   For her history of colon cancer, she has no evidence of disease recurrence.  She will continue on observation.   The patient was advised to call immediately if she has any concerning symptoms in the interval. The patient voices understanding of current disease status and treatment options and is in agreement with the current care plan. All questions were answered. The patient  knows to call the clinic with any problems, questions or concerns. We can certainly see the patient much sooner if necessary.   No orders of the defined types were placed in this encounter.    Antione Obar L Thersa Mohiuddin, PA-C 05/04/19  ADDENDUM: Hematology/Oncology Attending: I had a face-to-face encounter with the patient today.  I recommended her care plan.  This is a very pleasant 78 years old white female with metastatic non-small cell lung cancer, adenocarcinoma with EGFR mutation and currently on treatment with Tagrisso 80 mg p.o. daily status post 29 months of treatment and has been tolerating this treatment fairly well with no significant adverse effects.  The patient also has a history of colon cancer as well as breast cancer. She had repeat CT scan of the chest, abdomen pelvis performed recently.  I personally and independently reviewed the scan and discussed the results with the patient today. HIDA scan showed no concerning findings for disease progression except for a slightly enlarged mediastinal lymph nodes that increase by 3 mm up to 6 mm in dimension. I recommended for  the patient to continue her current treatment with Tagrisso with the same dose we will continue to monitor her closely for any other changes in the future imaging studies. She will come back for follow-up visit in 6 weeks for evaluation and repeat blood work. For the history of breast cancer the patient will continue her current treatment with Humira. For the anemia, she will continue on the oral iron tablets for now. She was advised to call immediately if she has any other concerning symptoms in the interval.  Disclaimer: This note was dictated with voice recognition software. Similar sounding words can inadvertently be transcribed and may be missed upon review. Eilleen Kempf, MD 05/04/19

## 2019-05-04 NOTE — Telephone Encounter (Signed)
Scheduled appt per 7/14 los - gave pt AVS and calender per los.

## 2019-05-09 ENCOUNTER — Other Ambulatory Visit: Payer: Self-pay | Admitting: Internal Medicine

## 2019-06-11 ENCOUNTER — Telehealth: Payer: Self-pay | Admitting: Medical Oncology

## 2019-06-11 NOTE — Telephone Encounter (Signed)
Dtr requesting she accompany her mother to appt next week.  I LVM for dtr that she cannot accompany her mother into the building ,but that we can call her during the visit.

## 2019-06-15 ENCOUNTER — Inpatient Hospital Stay: Payer: Medicare Other | Attending: Internal Medicine | Admitting: Internal Medicine

## 2019-06-15 ENCOUNTER — Telehealth: Payer: Self-pay | Admitting: Internal Medicine

## 2019-06-15 ENCOUNTER — Other Ambulatory Visit: Payer: Self-pay

## 2019-06-15 ENCOUNTER — Inpatient Hospital Stay: Payer: Medicare Other

## 2019-06-15 ENCOUNTER — Encounter: Payer: Self-pay | Admitting: Internal Medicine

## 2019-06-15 VITALS — BP 134/61 | HR 63 | Temp 98.3°F | Resp 18 | Ht 66.0 in | Wt 133.1 lb

## 2019-06-15 DIAGNOSIS — C50919 Malignant neoplasm of unspecified site of unspecified female breast: Secondary | ICD-10-CM | POA: Diagnosis present

## 2019-06-15 DIAGNOSIS — Z803 Family history of malignant neoplasm of breast: Secondary | ICD-10-CM | POA: Diagnosis not present

## 2019-06-15 DIAGNOSIS — Z85038 Personal history of other malignant neoplasm of large intestine: Secondary | ICD-10-CM | POA: Diagnosis not present

## 2019-06-15 DIAGNOSIS — C3492 Malignant neoplasm of unspecified part of left bronchus or lung: Secondary | ICD-10-CM

## 2019-06-15 DIAGNOSIS — Z9221 Personal history of antineoplastic chemotherapy: Secondary | ICD-10-CM | POA: Insufficient documentation

## 2019-06-15 DIAGNOSIS — C3412 Malignant neoplasm of upper lobe, left bronchus or lung: Secondary | ICD-10-CM | POA: Diagnosis present

## 2019-06-15 DIAGNOSIS — C3490 Malignant neoplasm of unspecified part of unspecified bronchus or lung: Secondary | ICD-10-CM

## 2019-06-15 DIAGNOSIS — Z9013 Acquired absence of bilateral breasts and nipples: Secondary | ICD-10-CM | POA: Diagnosis not present

## 2019-06-15 DIAGNOSIS — I10 Essential (primary) hypertension: Secondary | ICD-10-CM | POA: Diagnosis not present

## 2019-06-15 DIAGNOSIS — Z170 Estrogen receptor positive status [ER+]: Secondary | ICD-10-CM | POA: Diagnosis not present

## 2019-06-15 DIAGNOSIS — C182 Malignant neoplasm of ascending colon: Secondary | ICD-10-CM

## 2019-06-15 DIAGNOSIS — Z8042 Family history of malignant neoplasm of prostate: Secondary | ICD-10-CM | POA: Insufficient documentation

## 2019-06-15 DIAGNOSIS — Z5111 Encounter for antineoplastic chemotherapy: Secondary | ICD-10-CM

## 2019-06-15 DIAGNOSIS — D509 Iron deficiency anemia, unspecified: Secondary | ICD-10-CM | POA: Diagnosis not present

## 2019-06-15 DIAGNOSIS — Z79811 Long term (current) use of aromatase inhibitors: Secondary | ICD-10-CM | POA: Diagnosis not present

## 2019-06-15 DIAGNOSIS — Z801 Family history of malignant neoplasm of trachea, bronchus and lung: Secondary | ICD-10-CM | POA: Diagnosis not present

## 2019-06-15 DIAGNOSIS — Z9049 Acquired absence of other specified parts of digestive tract: Secondary | ICD-10-CM | POA: Diagnosis not present

## 2019-06-15 DIAGNOSIS — Z79899 Other long term (current) drug therapy: Secondary | ICD-10-CM | POA: Diagnosis not present

## 2019-06-15 LAB — CBC WITH DIFFERENTIAL (CANCER CENTER ONLY)
Abs Immature Granulocytes: 0.01 10*3/uL (ref 0.00–0.07)
Basophils Absolute: 0 10*3/uL (ref 0.0–0.1)
Basophils Relative: 1 %
Eosinophils Absolute: 0.2 10*3/uL (ref 0.0–0.5)
Eosinophils Relative: 4 %
HCT: 34.5 % — ABNORMAL LOW (ref 36.0–46.0)
Hemoglobin: 11.1 g/dL — ABNORMAL LOW (ref 12.0–15.0)
Immature Granulocytes: 0 %
Lymphocytes Relative: 21 %
Lymphs Abs: 0.9 10*3/uL (ref 0.7–4.0)
MCH: 29.8 pg (ref 26.0–34.0)
MCHC: 32.2 g/dL (ref 30.0–36.0)
MCV: 92.5 fL (ref 80.0–100.0)
Monocytes Absolute: 0.7 10*3/uL (ref 0.1–1.0)
Monocytes Relative: 17 %
Neutro Abs: 2.4 10*3/uL (ref 1.7–7.7)
Neutrophils Relative %: 57 %
Platelet Count: 219 10*3/uL (ref 150–400)
RBC: 3.73 MIL/uL — ABNORMAL LOW (ref 3.87–5.11)
RDW: 13.2 % (ref 11.5–15.5)
WBC Count: 4.1 10*3/uL (ref 4.0–10.5)
nRBC: 0 % (ref 0.0–0.2)

## 2019-06-15 LAB — CMP (CANCER CENTER ONLY)
ALT: 15 U/L (ref 0–44)
AST: 19 U/L (ref 15–41)
Albumin: 3.6 g/dL (ref 3.5–5.0)
Alkaline Phosphatase: 82 U/L (ref 38–126)
Anion gap: 6 (ref 5–15)
BUN: 16 mg/dL (ref 8–23)
CO2: 27 mmol/L (ref 22–32)
Calcium: 9.5 mg/dL (ref 8.9–10.3)
Chloride: 107 mmol/L (ref 98–111)
Creatinine: 1.18 mg/dL — ABNORMAL HIGH (ref 0.44–1.00)
GFR, Est AFR Am: 50 mL/min — ABNORMAL LOW (ref >60)
GFR, Estimated: 44 mL/min — ABNORMAL LOW (ref >60)
Glucose, Bld: 104 mg/dL — ABNORMAL HIGH (ref 70–99)
Potassium: 4.5 mmol/L (ref 3.5–5.1)
Sodium: 140 mmol/L (ref 135–145)
Total Bilirubin: 0.4 mg/dL (ref 0.3–1.2)
Total Protein: 6.5 g/dL (ref 6.5–8.1)

## 2019-06-15 NOTE — Progress Notes (Signed)
[place] [phone]   [phone]) [phone]  OFFICE PROGRESS NOTE  Eber Hong, MD [address] 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 2) adenocarcinoma of the ascending colon (T2, N0, M0) with MSI high diagnosed in March 2017. 3) history of breast adenocarcinoma.  PRIOR THERAPY:  1) Tarceva 150 mg by mouth daily status post 68 months of treatment. 2) Status post right colectomy with lymph node dissection in May 2017.  CURRENT THERAPY: 1) Tagrisso 80 mg by mouth daily started 11/09/2016.  Status post 24 months of treatment. 2) observation for the history of colon adenocarcinoma. 3) Femara 2.5 mg by mouth daily for history of breast cancer.  INTERVAL HISTORY: Kristin [name] 78 y.o. female returns to the clinic today for follow-up visit.  The patient is feeling fine today with no concerning complaints except for stress after her son was recently diagnosed with metastatic pancreatic cancer.  Her blood pressure is elevated today and she was not taking any of her blood pressure medication since discharge from the hospital.  She denied having any chest pain, shortness breath, cough or hemoptysis.  She denied having any fever or chills.  She has no nausea, vomiting, diarrhea or constipation.  She has no headache or visual changes.  She is here today for evaluation and repeat blood work.   MEDICAL HISTORY: Past Medical History:  Diagnosis Date  . Allergy   . Anemia   . Anemia of chronic disease 09/21/2016  . Anxiety   . Arthritis    arthritis- left hip  . Blood transfusion without reported diagnosis    transfusion- 3-4 yrs ago -found to be anemic on routine lab check  . Breast cancer ([place]) 2002   bilateral- bilateral mastectomies done.  . Clinical  depression 12/02/2000  . CVA (cerebral infarction) 04/30/2013  . Encounter for antineoplastic chemotherapy 10/28/2016  . Family history of brain cancer   . Family history of breast cancer   . Family history of colon cancer 10/06/2009  . Family history of colon cancer   . Family history of prostate cancer   . GERD (gastroesophageal reflux disease)   . Glaucoma   . History of bilateral mastectomy 05/20/2010  . Hypertension   . Lung cancer ([place]) dx'd 12/2010   last Ct scan "no lung cancer" showing 11'16 CT Chest Epic.  . Malignant neoplasm of ascending colon ([place]) 01/29/2016  . Stroke [facility])    2 yrs ago-no residual  . Tubular adenoma of colon 07/2011   colon polyps ans reoccurence with malignancy found    ALLERGIES:  is allergic to doxycycline and sulfa antibiotics.  MEDICATIONS:  Current Outpatient Medications  Medication Sig Dispense Refill  . alendronate (FOSAMAX) 70 MG tablet Take 70 mg once a week by mouth.    Marland Kitchen amLODipine (NORVASC) 5 MG tablet Take 1 tablet by mouth as directed. take one tablet in am and 1/2 tablet in pm-    . brimonidine-timolol (COMBIGAN) 0.2-0.5 % ophthalmic solution Place 1 drop into the right eye every 12 (twelve) hours.    . Cholecalciferol (VITAMIN D3) 2000 units capsule Take 2,000 Units daily by mouth.    . clopidogrel (PLAVIX) 75 MG tablet Take 75 mg by mouth daily.    . cycloSPORINE (RESTASIS) 0.05 % ophthalmic emulsion Place 1 drop into both  eyes 2 (two) times daily.    Marland Kitchen escitalopram (LEXAPRO) 10 MG tablet Take 10 mg by mouth daily.     . famotidine (PEPCID) 40 MG tablet Take 40 mg by mouth daily.    . feeding supplement, ENSURE ENLIVE, (ENSURE ENLIVE) LIQD Take 237 mLs by mouth 2 (two) times daily between meals. 237 mL 12  . FeFum-FePoly-FA-B Cmp-C-Biot (FOLIVANE-PLUS) CAPS TAKE 1 CAPSULE BY MOUTH EVERY DAY IN THE MORNING 30 capsule 2  . letrozole (FEMARA) 2.5 MG tablet TAKE 1 TABLET BY MOUTH EVERY DAY 90 tablet 1  . methimazole (TAPAZOLE) 10 MG tablet      . Multiple Vitamins-Iron (MULTIVITAMINS WITH IRON) TABS tablet Take 1 tablet by mouth daily.  0  . osimertinib mesylate (TAGRISSO) 80 MG tablet Take 1 tablet (80 mg total) by mouth daily. 30 tablet 3   Current Facility-Administered Medications  Medication Dose Route Frequency Provider Last Rate Last Dose  . 0.9 %  sodium chloride infusion  500 mL Intravenous Continuous Armbruster, Carlota Raspberry, MD        SURGICAL HISTORY:  Past Surgical History:  Procedure Laterality Date  . APPENDECTOMY    . BOWEL DECOMPRESSION N/A 06/12/2018   Procedure: BOWEL DECOMPRESSION;  Surgeon: Rush Landmark Telford Nab., MD;  Location: [facility];  Service: Gastroenterology;  Laterality: N/A;  . cataracts Bilateral   . COLECTOMY WITH COLOSTOMY CREATION/HARTMANN PROCEDURE  06/16/2018   Procedure: CREATION/HARTMANN COLOSTOMY;  Surgeon: Michael Boston, MD;  Location: [facility];  Service: General;;  . COLONOSCOPY    . COLOSTOMY N/A 06/16/2018   Procedure: POSSIBLE COLOSTOMY;  Surgeon: Michael Boston, MD;  Location: [facility];  Service: General;  Laterality: N/A;  . EYE SURGERY    . FLEXIBLE SIGMOIDOSCOPY N/A 06/12/2018   Procedure: FLEXIBLE SIGMOIDOSCOPY;  Surgeon: Rush Landmark Telford Nab., MD;  Location: [facility];  Service: Gastroenterology;  Laterality: N/A;  . LAPAROSCOPIC SIGMOID COLECTOMY N/A 06/16/2018   Procedure: LAPAROSCOPIC LOW ANTERIOR RESECTION;  Surgeon: Michael Boston, MD;  Location: [facility];  Service: General;  Laterality: N/A;  . LUNG SURGERY     Resection -" not done. Pt . tx with Tarceva  . MASTECTOMY Bilateral   . MEDIASTINOSCOPY N/A 10/02/2016   Procedure: MEDIASTINOSCOPY;  Surgeon: [name], MD;  Location: [facility] OR;  Service: Thoracic;  Laterality: N/A;  . RECONSTRUCTION BREAST W/ TRAM FLAP Bilateral    bilaterally  . robotic right hemicolectomy Right 03/06/2016   Dr. Johney Maine  . TEE WITHOUT CARDIOVERSION N/A 06/04/2013   Procedure: TRANSESOPHAGEAL ECHOCARDIOGRAM (TEE);  Surgeon: Jolaine Artist, MD;  Location: [facility] ENDOSCOPY;  Service: Cardiovascular;  Laterality: N/A;  . TONSILLECTOMY    . TOTAL HIP ARTHROPLASTY Left 08/07/2016   Procedure: LEFT TOTAL HIP ARTHROPLASTY ANTERIOR APPROACH;  Surgeon: Gaynelle Arabian, MD;  Location: [facility];  Service: Orthopedics;  Laterality: Left;    REVIEW OF SYSTEMS:  A comprehensive review of systems was negative except for: Constitutional: positive for fatigue Musculoskeletal: positive for arthralgias   PHYSICAL EXAMINATION: General appearance: alert, cooperative, fatigued and no distress Head: Normocephalic, without obvious abnormality, atraumatic Neck: no adenopathy, no JVD, supple, symmetrical, trachea midline and thyroid not enlarged, symmetric, no tenderness/mass/nodules Lymph nodes: Cervical, supraclavicular, and axillary nodes normal. Resp: clear to auscultation bilaterally Back: symmetric, no curvature. ROM normal. No CVA tenderness. Cardio: regular rate and rhythm, S1, S2 normal, no murmur, click, rub or gallop GI: soft, non-tender; bowel sounds normal; no masses,  no organomegaly Extremities: extremities normal, atraumatic, no cyanosis or edema  ECOG  PERFORMANCE STATUS: 1 - Symptomatic but completely ambulatory  There were no vitals taken for this visit.  LABORATORY DATA: Lab Results  Component Value Date   WBC 4.1 06/15/2019   HGB 11.1 (L) 06/15/2019   HCT 34.5 (L) 06/15/2019   MCV 92.5 06/15/2019   PLT 219 06/15/2019      Chemistry      Component Value Date/Time   NA 141 05/04/2019 1300   NA 138 09/26/2017 0942   K 4.3 05/04/2019 1300   K 5.2 No visable hemolysis (H) 09/26/2017 0942   CL 107 05/04/2019 1300   CL 107 04/05/2013 0754   CO2 27 05/04/2019 1300   CO2 26 09/26/2017 0942   BUN 19 05/04/2019 1300   BUN 16.0 09/26/2017 0942   CREATININE 1.16 (H) 05/04/2019 1300   CREATININE 1.2 (H) 09/26/2017 0942      Component Value Date/Time   CALCIUM 9.3 05/04/2019 1300   CALCIUM 9.9 09/26/2017 0942    ALKPHOS 81 05/04/2019 1300   ALKPHOS 68 09/26/2017 0942   AST 16 05/04/2019 1300   AST 18 09/26/2017 0942   ALT 13 05/04/2019 1300   ALT 13 09/26/2017 0942   BILITOT 0.3 05/04/2019 1300   BILITOT 0.31 09/26/2017 0942       RADIOGRAPHIC STUDIES: No results found.  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 The patient is currently on treatment with Tagrisso 80 mg by mouth daily status post 24 months.  The patient has been tolerating this treatment well. I recommended for her to continue her current treatment with Tagrisso with the same dose. For the hypertension I gave the patient a dose of clonidine 0.2 mg p.o. x1 in the clinic and she was advised to resume her blood pressure medication and to discuss with her primary care physician for adjustment of medication if needed. For the anemia, I recommended for the patient to continue on oral iron tablets and I gave her prescription for Integra +1 capsule p.o. daily. For the history of breast cancer, she will continue on Femara. For the history of colon cancer, she has no evidence for disease recurrence, she will continue on observation for now. I will see her back for follow-up visit in 1 months for evaluation after repeating CT scan of the chest, abdomen and pelvis for restaging of her disease. The patient was advised to call immediately if she has any concerning symptoms in the interval. The patient voices understanding of current disease status and treatment options and is in agreement with the current care plan. All questions were answered. The patient knows to call the clinic with any problems, questions or concerns. We can certainly see the patient much sooner  if necessary.  Disclaimer: This note was dictated with voice recognition software. Similar sounding words can inadvertently be transcribed and may not be corrected upon review.            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 2) adenocarcinoma of  the ascending colon (T2, N0, M0) with MSI high diagnosed in March 2017. 3) history of breast adenocarcinoma.  PRIOR THERAPY:  1) Tarceva 150 mg by mouth daily status post 68 months of treatment. 2) Status post right colectomy with lymph node dissection in May 2017.  CURRENT THERAPY: 1) Tagrisso 80 mg by mouth daily started 11/09/2016.  Status post 31 months of treatment. 2) observation for the history of colon adenocarcinoma. 3) Femara 2.5 mg by mouth daily for history of breast cancer.  INTERVAL HISTORY: Kristin [name] 78 y.o. female returns to the clinic today for follow-up visit.  The patient is feeling fine today with no concerning complaints.  She denied having any chest pain, shortness of breath, cough or hemoptysis.  She has no nausea, vomiting, diarrhea or constipation.  She denied having any headache or visual changes.  She has no concerning weight loss or night sweats.  She has been tolerating her treatment with Tagrisso fairly well.  She is here today for evaluation and repeat blood work.  MEDICAL HISTORY: Past Medical History:  Diagnosis Date  . Allergy   . Anemia   . Anemia of chronic disease 09/21/2016  . Anxiety   . Arthritis    arthritis- left hip  . Blood transfusion without reported diagnosis    transfusion- 3-4 yrs ago -found to be anemic on routine lab check  . Breast cancer ([place]) 2002    bilateral- bilateral mastectomies done.  . 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 CVA (cerebral infarction) 04/30/2013  . Encounter for antineoplastic chemotherapy 10/28/2016  . Family history of brain cancer   . Family history of breast cancer   . Family history of colon cancer 10/06/2009  . Family history of colon cancer   . Family history of prostate cancer   . GERD (gastroesophageal reflux disease)   . Glaucoma   . History of bilateral mastectomy 05/20/2010  . Hypertension   . Lung cancer ([place]) dx'd 12/2010   last Ct scan "no lung cancer" showing 11'16 CT Chest Epic.  . Malignant neoplasm of ascending colon ([place]) 01/29/2016  . Stroke [facility])    2 yrs ago-no residual  . Tubular adenoma of colon 07/2011   colon polyps ans reoccurence with malignancy found    ALLERGIES:  is allergic to doxycycline and sulfa antibiotics.  MEDICATIONS:  Current Outpatient Medications  Medication Sig Dispense Refill  . alendronate (FOSAMAX) 70 MG tablet Take 70 mg once a week by mouth.    Marland Kitchen amLODipine (NORVASC) 5 MG tablet Take 1 tablet by mouth as directed. take one tablet in am and 1/2 tablet in pm-    . brimonidine-timolol (COMBIGAN) 0.2-0.5 % ophthalmic solution Place 1 drop into the right eye every 12 (twelve) hours.    . Cholecalciferol (VITAMIN D3) 2000 units capsule Take 2,000 Units daily by mouth.    . clopidogrel (PLAVIX) 75 MG tablet Take 75 mg by mouth daily.    . cycloSPORINE (RESTASIS) 0.05 % ophthalmic emulsion Place 1 drop into both eyes 2 (two) times daily.    Marland Kitchen escitalopram (LEXAPRO) 10 MG tablet Take 10 mg by mouth daily.     . famotidine (PEPCID) 40 MG tablet Take 40 mg by mouth daily.    . feeding supplement, ENSURE ENLIVE, (ENSURE ENLIVE) LIQD Take 237 mLs by mouth 2 (two) times daily between meals. 237 mL 12  . FeFum-FePoly-FA-B Cmp-C-Biot (FOLIVANE-PLUS) CAPS TAKE 1 CAPSULE BY MOUTH EVERY DAY IN THE MORNING 30 capsule 2  . letrozole (FEMARA) 2.5  MG tablet TAKE 1 TABLET BY MOUTH EVERY DAY  90 tablet 1  . methimazole (TAPAZOLE) 10 MG tablet     . Multiple Vitamins-Iron (MULTIVITAMINS WITH IRON) TABS tablet Take 1 tablet by mouth daily.  0  . osimertinib mesylate (TAGRISSO) 80 MG tablet Take 1 tablet (80 mg total) by mouth daily. 30 tablet 3   Current Facility-Administered Medications  Medication Dose Route Frequency Provider Last Rate Last Dose  . 0.9 %  sodium chloride infusion  500 mL Intravenous Continuous Armbruster, Carlota Raspberry, MD        SURGICAL HISTORY:  Past Surgical History:  Procedure Laterality Date  . APPENDECTOMY    . BOWEL DECOMPRESSION N/A 06/12/2018   Procedure: BOWEL DECOMPRESSION;  Surgeon: Rush Landmark Telford Nab., MD;  Location: [facility];  Service: Gastroenterology;  Laterality: N/A;  . cataracts Bilateral   . COLECTOMY WITH COLOSTOMY CREATION/HARTMANN PROCEDURE  06/16/2018   Procedure: CREATION/HARTMANN COLOSTOMY;  Surgeon: Michael Boston, MD;  Location: [facility];  Service: General;;  . COLONOSCOPY    . COLOSTOMY N/A 06/16/2018   Procedure: POSSIBLE COLOSTOMY;  Surgeon: Michael Boston, MD;  Location: [facility];  Service: General;  Laterality: N/A;  . EYE SURGERY    . FLEXIBLE SIGMOIDOSCOPY N/A 06/12/2018   Procedure: FLEXIBLE SIGMOIDOSCOPY;  Surgeon: Rush Landmark Telford Nab., MD;  Location: [facility];  Service: Gastroenterology;  Laterality: N/A;  . LAPAROSCOPIC SIGMOID COLECTOMY N/A 06/16/2018   Procedure: LAPAROSCOPIC LOW ANTERIOR RESECTION;  Surgeon: Michael Boston, MD;  Location: [facility];  Service: General;  Laterality: N/A;  . LUNG SURGERY     Resection -" not done. Pt . tx with Tarceva  . MASTECTOMY Bilateral   . MEDIASTINOSCOPY N/A 10/02/2016   Procedure: MEDIASTINOSCOPY;  Surgeon: [name], MD;  Location: [facility] OR;  Service: Thoracic;  Laterality: N/A;  . RECONSTRUCTION BREAST W/ TRAM FLAP Bilateral    bilaterally  . robotic right hemicolectomy Right 03/06/2016   Dr. Johney Maine  . TEE WITHOUT CARDIOVERSION N/A 06/04/2013   Procedure:  TRANSESOPHAGEAL ECHOCARDIOGRAM (TEE);  Surgeon: Jolaine Artist, MD;  [facility]COPY;  Service: Cardiovascular;  Laterality: N/A;  . TONSILLECTOMY    . TOTAL HIP ARTHROPLASTY Left 08/07/2016   Procedure: LEFT TOTAL HIP ARTHROPLASTY ANTERIOR APPROACH;  Surgeon: Gaynelle Arabian, MD;  Location: [facility];  Service: Orthopedics;  Laterality: Left;    REVIEW OF SYSTEMS:  A comprehensive review of systems was negative.   PHYSICAL EXAMINATION: General appearance: alert, cooperative and no distress Head: Normocephalic, without obvious abnormality, atraumatic Neck: no adenopathy, no JVD, supple, symmetrical, trachea midline and thyroid not enlarged, symmetric, no tenderness/mass/nodules Lymph nodes: Cervical, supraclavicular, and axillary nodes normal. Resp: clear to auscultation bilaterally Back: symmetric, no curvature. ROM normal. No CVA tenderness. Cardio: regular rate and rhythm, S1, S2 normal, no murmur, click, rub or gallop GI: soft, non-tender; bowel sounds normal; no masses,  no organomegaly Extremities: extremities normal, atraumatic, no cyanosis or edema  ECOG PERFORMANCE STATUS: 1 - Symptomatic but completely ambulatory  Blood pressure 134/61, pulse 63, temperature 98.3 F (36.8 C), temperature source Oral, resp. rate 18, height 5' 6"  (1.676 m), weight 133 lb 1.6 oz (60.4 kg).  LABORATORY DATA: Lab Results  Component Value Date   WBC 4.1 06/15/2019   HGB 11.1 (L) 06/15/2019   HCT 34.5 (L) 06/15/2019   MCV 92.5 06/15/2019   PLT 219 06/15/2019      Chemistry      Component Value Date/Time   NA 141 05/04/2019 1300   NA  138 09/26/2017 0942   K 4.3 05/04/2019 1300   K 5.2 No visable hemolysis (H) 09/26/2017 0942   CL 107 05/04/2019 1300   CL 107 04/05/2013 0754   CO2 27 05/04/2019 1300   CO2 26 09/26/2017 0942   BUN 19 05/04/2019 1300   BUN 16.0 09/26/2017 0942   CREATININE 1.16 (H) 05/04/2019 1300   CREATININE 1.2 (H) 09/26/2017 0942      Component Value  Date/Time   CALCIUM 9.3 05/04/2019 1300   CALCIUM 9.9 09/26/2017 0942   ALKPHOS 81 05/04/2019 1300   ALKPHOS 68 09/26/2017 0942   AST 16 05/04/2019 1300   AST 18 09/26/2017 0942   ALT 13 05/04/2019 1300   ALT 13 09/26/2017 0942   BILITOT 0.3 05/04/2019 1300   BILITOT 0.31 09/26/2017 0942       RADIOGRAPHIC STUDIES: No results found.  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 The patient is currently on treatment with Tagrisso 80 mg by mouth daily status post 31 months.  She has been tolerating the treatment well with no concerning adverse effects. I recommended for her to continue her current treatment with Tagrisso with the same dose. For the anemia, I recommended for the patient to continue on oral iron tablets and I gave her prescription for Integra +1 capsule p.o. daily. For the history of breast cancer, she will continue on Femara. For the history of colon cancer, she has no evidence for disease recurrence, she will continue on observation for now. I will see her back for follow-up visit in 2 months for evaluation with repeat CT scan of the chest, abdomen and pelvis for restaging of her disease. She was advised to call immediately if she has any concerning symptoms in the interval. The patient voices understanding of current disease status and treatment options and is in agreement with the current care plan. All questions were answered. The patient knows to call the clinic with any problems, questions or concerns. We can certainly see the patient much sooner if necessary.  Disclaimer: This note was dictated with voice recognition software. Similar sounding words can inadvertently be transcribed and  may not be corrected upon review.

## 2019-06-15 NOTE — Telephone Encounter (Signed)
Scheduled per 08/25 los, patient received avs and calender.

## 2019-08-05 ENCOUNTER — Encounter (INDEPENDENT_AMBULATORY_CARE_PROVIDER_SITE_OTHER): Payer: Medicare Other | Admitting: Ophthalmology

## 2019-08-05 DIAGNOSIS — I10 Essential (primary) hypertension: Secondary | ICD-10-CM

## 2019-08-05 DIAGNOSIS — H35033 Hypertensive retinopathy, bilateral: Secondary | ICD-10-CM

## 2019-08-05 DIAGNOSIS — H353111 Nonexudative age-related macular degeneration, right eye, early dry stage: Secondary | ICD-10-CM

## 2019-08-05 DIAGNOSIS — D3132 Benign neoplasm of left choroid: Secondary | ICD-10-CM

## 2019-08-05 DIAGNOSIS — H353122 Nonexudative age-related macular degeneration, left eye, intermediate dry stage: Secondary | ICD-10-CM

## 2019-08-05 DIAGNOSIS — H35372 Puckering of macula, left eye: Secondary | ICD-10-CM

## 2019-08-05 DIAGNOSIS — H43813 Vitreous degeneration, bilateral: Secondary | ICD-10-CM

## 2019-08-11 ENCOUNTER — Telehealth: Payer: Self-pay | Admitting: Medical Oncology

## 2019-08-11 ENCOUNTER — Other Ambulatory Visit: Payer: Self-pay | Admitting: Medical Oncology

## 2019-08-11 DIAGNOSIS — C50112 Malignant neoplasm of central portion of left female breast: Secondary | ICD-10-CM

## 2019-08-11 DIAGNOSIS — C3412 Malignant neoplasm of upper lobe, left bronchus or lung: Secondary | ICD-10-CM

## 2019-08-11 DIAGNOSIS — C50111 Malignant neoplasm of central portion of right female breast: Secondary | ICD-10-CM

## 2019-08-11 DIAGNOSIS — C182 Malignant neoplasm of ascending colon: Secondary | ICD-10-CM

## 2019-08-11 DIAGNOSIS — D638 Anemia in other chronic diseases classified elsewhere: Secondary | ICD-10-CM

## 2019-08-11 DIAGNOSIS — Z5111 Encounter for antineoplastic chemotherapy: Secondary | ICD-10-CM

## 2019-08-11 MED ORDER — OSIMERTINIB MESYLATE 80 MG PO TABS: 80.0000 mg | ORAL_TABLET | Freq: Every day | ORAL | 3 refills | Status: DC

## 2019-08-11 NOTE — Telephone Encounter (Signed)
Tagrisso refill requested .need to be faxed to [facility] and me.

## 2019-08-16 ENCOUNTER — Inpatient Hospital Stay: Payer: Medicare Other | Attending: Internal Medicine

## 2019-08-16 ENCOUNTER — Ambulatory Visit (HOSPITAL_COMMUNITY)
Admission: RE | Admit: 2019-08-16 | Discharge: 2019-08-16 | Disposition: A | Discharge status: Home / Self Care | Payer: Medicare Other | Source: Ambulatory Visit | Attending: Internal Medicine | Admitting: Internal Medicine

## 2019-08-16 ENCOUNTER — Encounter (HOSPITAL_COMMUNITY): Payer: Self-pay

## 2019-08-16 ENCOUNTER — Other Ambulatory Visit: Payer: Self-pay

## 2019-08-16 DIAGNOSIS — Z8042 Family history of malignant neoplasm of prostate: Secondary | ICD-10-CM | POA: Diagnosis not present

## 2019-08-16 DIAGNOSIS — I10 Essential (primary) hypertension: Secondary | ICD-10-CM | POA: Insufficient documentation

## 2019-08-16 DIAGNOSIS — Z170 Estrogen receptor positive status [ER+]: Secondary | ICD-10-CM | POA: Diagnosis not present

## 2019-08-16 DIAGNOSIS — Z933 Colostomy status: Secondary | ICD-10-CM | POA: Insufficient documentation

## 2019-08-16 DIAGNOSIS — Z85038 Personal history of other malignant neoplasm of large intestine: Secondary | ICD-10-CM | POA: Diagnosis not present

## 2019-08-16 DIAGNOSIS — Z79811 Long term (current) use of aromatase inhibitors: Secondary | ICD-10-CM | POA: Diagnosis not present

## 2019-08-16 DIAGNOSIS — Z800 Family history of malignant neoplasm of digestive organs: Secondary | ICD-10-CM | POA: Diagnosis not present

## 2019-08-16 DIAGNOSIS — C50919 Malignant neoplasm of unspecified site of unspecified female breast: Secondary | ICD-10-CM | POA: Insufficient documentation

## 2019-08-16 DIAGNOSIS — C3490 Malignant neoplasm of unspecified part of unspecified bronchus or lung: Secondary | ICD-10-CM | POA: Insufficient documentation

## 2019-08-16 DIAGNOSIS — Z9013 Acquired absence of bilateral breasts and nipples: Secondary | ICD-10-CM | POA: Diagnosis not present

## 2019-08-16 DIAGNOSIS — C3412 Malignant neoplasm of upper lobe, left bronchus or lung: Secondary | ICD-10-CM | POA: Insufficient documentation

## 2019-08-16 LAB — CBC WITH DIFFERENTIAL (CANCER CENTER ONLY)
Abs Immature Granulocytes: 0.02 10*3/uL (ref 0.00–0.07)
Basophils Absolute: 0.1 10*3/uL (ref 0.0–0.1)
Basophils Relative: 1 %
Eosinophils Absolute: 0.1 10*3/uL (ref 0.0–0.5)
Eosinophils Relative: 2 %
HCT: 37.6 % (ref 36.0–46.0)
Hemoglobin: 11.8 g/dL — ABNORMAL LOW (ref 12.0–15.0)
Immature Granulocytes: 0 %
Lymphocytes Relative: 18 %
Lymphs Abs: 0.9 10*3/uL (ref 0.7–4.0)
MCH: 29.1 pg (ref 26.0–34.0)
MCHC: 31.4 g/dL (ref 30.0–36.0)
MCV: 92.6 fL (ref 80.0–100.0)
Monocytes Absolute: 0.7 10*3/uL (ref 0.1–1.0)
Monocytes Relative: 15 %
Neutro Abs: 3.2 10*3/uL (ref 1.7–7.7)
Neutrophils Relative %: 64 %
Platelet Count: 256 10*3/uL (ref 150–400)
RBC: 4.06 MIL/uL (ref 3.87–5.11)
RDW: 13.3 % (ref 11.5–15.5)
WBC Count: 5 10*3/uL (ref 4.0–10.5)
nRBC: 0 % (ref 0.0–0.2)

## 2019-08-16 LAB — CMP (CANCER CENTER ONLY)
ALT: 17 U/L (ref 0–44)
AST: 21 U/L (ref 15–41)
Albumin: 4 g/dL (ref 3.5–5.0)
Alkaline Phosphatase: 85 U/L (ref 38–126)
Anion gap: 11 (ref 5–15)
BUN: 16 mg/dL (ref 8–23)
CO2: 24 mmol/L (ref 22–32)
Calcium: 10.5 mg/dL — ABNORMAL HIGH (ref 8.9–10.3)
Chloride: 107 mmol/L (ref 98–111)
Creatinine: 1.23 mg/dL — ABNORMAL HIGH (ref 0.44–1.00)
GFR, Est AFR Am: 48 mL/min — ABNORMAL LOW (ref >60)
GFR, Estimated: 41 mL/min — ABNORMAL LOW (ref >60)
Glucose, Bld: 105 mg/dL — ABNORMAL HIGH (ref 70–99)
Potassium: 4.4 mmol/L (ref 3.5–5.1)
Sodium: 142 mmol/L (ref 135–145)
Total Bilirubin: 0.5 mg/dL (ref 0.3–1.2)
Total Protein: 7.2 g/dL (ref 6.5–8.1)

## 2019-08-16 MED ORDER — SODIUM CHLORIDE (PF) 0.9 % IJ SOLN
INTRAMUSCULAR | Status: AC
  Filled 2019-08-16: qty 50

## 2019-08-16 MED ORDER — IOHEXOL 300 MG/ML  SOLN
80.0000 mL | Freq: Once | INTRAMUSCULAR | Status: AC | PRN
  Administered 2019-08-16: 100 mL via INTRAVENOUS

## 2019-08-23 ENCOUNTER — Encounter: Payer: Self-pay | Admitting: Internal Medicine

## 2019-08-23 ENCOUNTER — Telehealth: Payer: Self-pay | Admitting: Internal Medicine

## 2019-08-23 ENCOUNTER — Other Ambulatory Visit: Payer: Self-pay

## 2019-08-23 ENCOUNTER — Inpatient Hospital Stay: Payer: Medicare Other | Attending: Internal Medicine | Admitting: Internal Medicine

## 2019-08-23 VITALS — BP 152/72 | HR 79 | Temp 98.0°F | Resp 17 | Ht 66.0 in | Wt 135.0 lb

## 2019-08-23 DIAGNOSIS — I10 Essential (primary) hypertension: Secondary | ICD-10-CM | POA: Diagnosis not present

## 2019-08-23 DIAGNOSIS — Z5111 Encounter for antineoplastic chemotherapy: Secondary | ICD-10-CM

## 2019-08-23 DIAGNOSIS — Z79899 Other long term (current) drug therapy: Secondary | ICD-10-CM | POA: Insufficient documentation

## 2019-08-23 DIAGNOSIS — Z800 Family history of malignant neoplasm of digestive organs: Secondary | ICD-10-CM | POA: Insufficient documentation

## 2019-08-23 DIAGNOSIS — Z9013 Acquired absence of bilateral breasts and nipples: Secondary | ICD-10-CM | POA: Diagnosis not present

## 2019-08-23 DIAGNOSIS — Z803 Family history of malignant neoplasm of breast: Secondary | ICD-10-CM | POA: Insufficient documentation

## 2019-08-23 DIAGNOSIS — Z79811 Long term (current) use of aromatase inhibitors: Secondary | ICD-10-CM | POA: Insufficient documentation

## 2019-08-23 DIAGNOSIS — C3412 Malignant neoplasm of upper lobe, left bronchus or lung: Secondary | ICD-10-CM | POA: Insufficient documentation

## 2019-08-23 DIAGNOSIS — C50919 Malignant neoplasm of unspecified site of unspecified female breast: Secondary | ICD-10-CM | POA: Diagnosis not present

## 2019-08-23 DIAGNOSIS — C3492 Malignant neoplasm of unspecified part of left bronchus or lung: Secondary | ICD-10-CM

## 2019-08-23 DIAGNOSIS — C182 Malignant neoplasm of ascending colon: Secondary | ICD-10-CM

## 2019-08-23 DIAGNOSIS — Z170 Estrogen receptor positive status [ER+]: Secondary | ICD-10-CM | POA: Diagnosis not present

## 2019-08-23 DIAGNOSIS — Z8042 Family history of malignant neoplasm of prostate: Secondary | ICD-10-CM | POA: Insufficient documentation

## 2019-08-23 DIAGNOSIS — Z85038 Personal history of other malignant neoplasm of large intestine: Secondary | ICD-10-CM | POA: Diagnosis not present

## 2019-08-23 DIAGNOSIS — D638 Anemia in other chronic diseases classified elsewhere: Secondary | ICD-10-CM

## 2019-08-23 NOTE — Telephone Encounter (Signed)
Scheduled appt per 11/2 los - gave patient AVS and calender per los.

## 2019-08-23 NOTE — Progress Notes (Signed)
[place] Telephone:(336) [phone]   Fax:(336) [phone]  OFFICE PROGRESS NOTE  Eber Hong, MD [address] [place] [address]  DIAGNOSIS:  1) metastatic non-small cell lung cancer, adenocarcinoma diagnosed in March 2012 with positive EGFR mutation in exon 21 (L858R). 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 2) adenocarcinoma of the ascending colon (T2, N0, M0) with MSI high diagnosed in March 2017. 3) history of breast adenocarcinoma.  PRIOR THERAPY:  1) Tarceva 150 mg by mouth daily status post 68 months of treatment. 2) Status post right colectomy with lymph node dissection in May 2017.  CURRENT THERAPY: 1) Tagrisso 80 mg by mouth daily started 11/09/2016.  Status post 24 months of treatment. 2) observation for the history of colon adenocarcinoma. 3) Femara 2.5 mg by mouth daily for history of breast cancer.  INTERVAL HISTORY: Kristin [name] 78 y.o. female returns to the clinic today for follow-up visit.  The patient is feeling fine today with no concerning complaints except for stress after her son was recently diagnosed with metastatic pancreatic cancer.  Her blood pressure is elevated today and she was not taking any of her blood pressure medication since discharge from the hospital.  She denied having any chest pain, shortness breath, cough or hemoptysis.  She denied having any fever or chills.  She has no nausea, vomiting, diarrhea or constipation.  She has no headache or visual changes.  She is here today for evaluation and repeat blood work.   MEDICAL HISTORY: Past Medical History:  Diagnosis Date   Allergy    Anemia    Anemia of chronic disease 09/21/2016   Anxiety    Arthritis    arthritis- left hip   Blood transfusion without reported diagnosis    transfusion- 3-4 yrs ago -found to be anemic on routine lab check   Breast cancer ([facility]) 2002   bilateral- bilateral mastectomies done.   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   Malignant neoplasm of ascending colon ([place]) 01/29/2016   Stroke ([place])    2 yrs ago-no residual   Tubular adenoma of colon 07/2011   colon polyps ans reoccurence with malignancy found    ALLERGIES:  is allergic to doxycycline and sulfa antibiotics.  MEDICATIONS:  Current Outpatient Medications  Medication Sig Dispense Refill   alendronate (FOSAMAX) 70 MG tablet Take 70 mg once a week by mouth.     amLODipine (NORVASC) 5 MG tablet Take 1 tablet by mouth as directed. take one tablet in am and 1/2 tablet in pm-     brimonidine-timolol (COMBIGAN) 0.2-0.5 % ophthalmic solution Place 1 drop into the right eye every 12 (twelve) hours.     Cholecalciferol (VITAMIN D3) 2000 units capsule Take 2,000 Units daily by mouth.     clopidogrel (PLAVIX) 75 MG tablet Take 75 mg by mouth daily.     cycloSPORINE (RESTASIS) 0.05 % ophthalmic emulsion Place 1 drop into  both eyes 2 (two) times daily.     escitalopram (LEXAPRO) 10 MG tablet Take 10 mg by mouth daily.      famotidine (PEPCID) 40 MG tablet Take 40 mg by mouth daily.     feeding supplement, ENSURE ENLIVE, (ENSURE ENLIVE) LIQD Take 237 mLs by mouth 2 (two) times daily between meals. 237 mL 12   FeFum-FePoly-FA-B Cmp-C-Biot (FOLIVANE-PLUS) CAPS TAKE 1 CAPSULE BY MOUTH EVERY DAY IN THE MORNING 30 capsule 2   letrozole (FEMARA) 2.5 MG tablet TAKE 1 TABLET BY MOUTH EVERY DAY 90 tablet 1   methimazole (TAPAZOLE) 10 MG tablet       Multiple Vitamins-Iron (MULTIVITAMINS WITH IRON) TABS tablet Take 1 tablet by mouth daily.  0   osimertinib mesylate (TAGRISSO) 80 MG tablet Take 1 tablet (80 mg total) by mouth daily. 30 tablet 3   pantoprazole (PROTONIX) 40 MG tablet Take by mouth.     Current Facility-Administered Medications  Medication Dose Route Frequency Provider Last Rate Last Dose   0.9 %  sodium chloride infusion  500 mL Intravenous Continuous Armbruster, Carlota Raspberry, MD        SURGICAL HISTORY:  Past Surgical History:  Procedure Laterality Date   APPENDECTOMY     BOWEL DECOMPRESSION N/A 06/12/2018   Procedure: BOWEL DECOMPRESSION;  Surgeon: Irving Copas., MD;  Location: [facility];  Service: Gastroenterology;  Laterality: N/A;   cataracts Bilateral    COLECTOMY WITH COLOSTOMY CREATION/HARTMANN PROCEDURE  06/16/2018   Procedure: CREATION/HARTMANN COLOSTOMY;  Surgeon: Michael Boston, MD;  Location: [facility];  Service: General;;   COLONOSCOPY     COLOSTOMY N/A 06/16/2018   Procedure: POSSIBLE COLOSTOMY;  Surgeon: Michael Boston, MD;  Location: [facility];  Service: General;  Laterality: N/A;   EYE SURGERY     FLEXIBLE SIGMOIDOSCOPY N/A 06/12/2018   Procedure: FLEXIBLE SIGMOIDOSCOPY;  Surgeon: Irving Copas., MD;  [facility];  Service: Gastroenterology;  Laterality: N/A;   LAPAROSCOPIC SIGMOID COLECTOMY N/A 06/16/2018   Procedure: LAPAROSCOPIC LOW ANTERIOR RESECTION;  Surgeon: Michael Boston, MD;  [facility];  Service: General;  Laterality: N/A;   LUNG SURGERY     Resection -" not done. Pt . tx with Tarceva   MASTECTOMY Bilateral    MEDIASTINOSCOPY N/A 10/02/2016   Procedure: MEDIASTINOSCOPY;  Surgeon: [name], MD;  Location[facility];  Service: Thoracic;  Laterality: N/A;   RECONSTRUCTION BREAST W/ TRAM FLAP Bilateral    bilaterally   robotic right hemicolectomy Right 03/06/2016   Dr. Johney Maine   TEE WITHOUT CARDIOVERSION N/A 06/04/2013   Procedure:  TRANSESOPHAGEAL ECHOCARDIOGRAM (TEE);  Surgeon: Jolaine Artist, MD;  Location: [place];  Service: Cardiovascular;  Laterality: N/A;   TONSILLECTOMY     TOTAL HIP ARTHROPLASTY Left 08/07/2016   Procedure: LEFT TOTAL HIP ARTHROPLASTY ANTERIOR APPROACH;  Surgeon: Gaynelle Arabian, MD;  [facility];  Service: Orthopedics;  Laterality: Left;    REVIEW OF SYSTEMS:  A comprehensive review of systems was negative except for: Constitutional: positive for fatigue Musculoskeletal: positive for arthralgias   PHYSICAL EXAMINATION: General appearance: alert, cooperative, fatigued and no distress Head: Normocephalic, without obvious abnormality, atraumatic Neck: no adenopathy, no JVD, supple, symmetrical, trachea midline and thyroid not enlarged, symmetric, no tenderness/mass/nodules Lymph nodes: Cervical, supraclavicular, and axillary nodes normal. Resp: clear to auscultation bilaterally Back: symmetric, no curvature. ROM normal. No CVA tenderness. Cardio: regular rate and rhythm, S1, S2 normal, no murmur, click, rub or gallop GI: soft, non-tender; bowel sounds normal; no masses,  no organomegaly Extremities: extremities normal, atraumatic, no cyanosis or edema  ECOG PERFORMANCE STATUS: 1 - Symptomatic but completely ambulatory  Blood pressure (!) 152/72, pulse 79, temperature 98 F (36.7 C), temperature source Temporal, resp. rate 17, height 5' 6" (1.676 m), weight 135 lb (61.2 kg), SpO2 100 %.  LABORATORY DATA: Lab Results  Component Value Date   WBC 5.0 08/16/2019   HGB 11.8 (L) 08/16/2019   HCT 37.6 08/16/2019   MCV 92.6 08/16/2019   PLT 256 08/16/2019      Chemistry      Component Value Date/Time   NA 142 08/16/2019 1014   NA 138 09/26/2017 0942   K 4.4 08/16/2019 1014   K 5.2 No visable hemolysis (H) 09/26/2017 0942   CL 107 08/16/2019 1014   CL 107 04/05/2013 0754   CO2 24 08/16/2019 1014   CO2 26 09/26/2017 0942   BUN 16 08/16/2019 1014   BUN 16.0 09/26/2017 0942     CREATININE 1.23 (H) 08/16/2019 1014   CREATININE 1.2 (H) 09/26/2017 0942      Component Value Date/Time   CALCIUM 10.5 (H) 08/16/2019 1014   CALCIUM 9.9 09/26/2017 0942   ALKPHOS 85 08/16/2019 1014   ALKPHOS 68 09/26/2017 0942   AST 21 08/16/2019 1014   AST 18 09/26/2017 0942   ALT 17 08/16/2019 1014   ALT 13 09/26/2017 0942   BILITOT 0.5 08/16/2019 1014   BILITOT 0.31 09/26/2017 0942       RADIOGRAPHIC STUDIES: Ct Chest W Contrast  Result Date: 08/16/2019 CLINICAL DATA:  Non-small-cell lung cancer. Restaging. EXAM: CT CHEST, ABDOMEN, AND PELVIS WITH CONTRAST TECHNIQUE: Multidetector CT imaging of the chest, abdomen and pelvis was performed following the standard protocol during bolus administration of intravenous contrast. CONTRAST:  136m OMNIPAQUE IOHEXOL 300 MG/ML  SOLN COMPARISON:  04/22/2019 FINDINGS: CT CHEST FINDINGS Cardiovascular: The heart size is normal. No substantial pericardial effusion. Atherosclerotic calcification is noted in the wall of the thoracic aorta. Mediastinum/Nodes: No mediastinal lymphadenopathy. There is no hilar lymphadenopathy. The esophagus has normal imaging features. Tiny bilateral thyroid nodules are stable. There is no axillary lymphadenopathy. Lungs/Pleura: Centrilobular emphsyema noted. Surgical scarring noted left apex. No suspicious pulmonary nodule or mass in the right lung. The irregular plaque-like soft tissue seen in the medial pleura of the left hemithorax, along the anterior mediastinum is similar to prior, again measuring up to about 6 mm maximum thickness (image 31/series 2). Small volume loculated pleural fluid at the posterior left base is similar with 9 mm nodular component visible on 49/2. Musculoskeletal: No worrisome lytic or sclerotic osseous abnormality. CT ABDOMEN PELVIS FINDINGS Hepatobiliary: Stable right hepatic cyst. There is no evidence for gallstones, gallbladder wall thickening, or pericholecystic fluid. No intrahepatic or  extrahepatic biliary dilation. Pancreas: No focal mass lesion. No dilatation of the main duct. No intraparenchymal cyst. No peripancreatic edema. Spleen: No splenomegaly. No focal mass lesion. Adrenals/Urinary Tract: No adrenal nodule or mass. 6 mm hypodensity interpolar right kidney is stable. Left kidney unremarkable. No evidence for hydroureter. Bladder decompressed. Stomach/Bowel: Large hiatal hernia. Duodenum is normally positioned as is the ligament of Treitz. No small bowel wall thickening. No small bowel dilatation. Status post right hemicolectomy. 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 Vascular/Lymphatic: There is abdominal aortic atherosclerosis without aneurysm. There is no gastrohepatic or hepatoduodenal ligament lymphadenopathy. No intraperitoneal or retroperitoneal lymphadenopathy. No pelvic sidewall lymphadenopathy. Reproductive: Calcified fibroids noted in the uterus. There is no  adnexal mass. Other: No intraperitoneal free fluid. Musculoskeletal: Status post left hip replacement. No worrisome lytic or sclerotic osseous abnormality. IMPRESSION: 1. Stable exam. No new or progressive findings in the chest, abdomen, or pelvis to suggest recurrent or metastatic disease. 2. The area of mild irregular pleural thickening seen anteriorly in the medial left hemithorax is stable in the interval. The tiny loculated posterior left pleural fluid collection is also unchanged. Continued close attention recommended. 3. Emphysema. 4. Large hiatal hernia. 5. 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 6. Aortic Atherosclerosis (ICD10-I70.0) and Emphysema (ICD10-J43.9). Electronically Signed   By: Misty Stanley M.D.   On: 08/16/2019 15:13   Ct Abdomen Pelvis W Contrast  Result Date: 08/16/2019 CLINICAL DATA:  Non-small-cell lung cancer. Restaging. EXAM: CT CHEST, ABDOMEN, AND PELVIS WITH CONTRAST  TECHNIQUE: Multidetector CT imaging of the chest, abdomen and pelvis was performed following the standard protocol during bolus administration of intravenous contrast. CONTRAST:  152m OMNIPAQUE IOHEXOL 300 MG/ML  SOLN COMPARISON:  04/22/2019 FINDINGS: CT CHEST FINDINGS Cardiovascular: The heart size is normal. No substantial pericardial effusion. Atherosclerotic calcification is noted in the wall of the thoracic aorta. Mediastinum/Nodes: No mediastinal lymphadenopathy. There is no hilar lymphadenopathy. The esophagus has normal imaging features. Tiny bilateral thyroid nodules are stable. There is no axillary lymphadenopathy. Lungs/Pleura: Centrilobular emphsyema noted. Surgical scarring noted left apex. No suspicious pulmonary nodule or mass in the right lung. The irregular plaque-like soft tissue seen in the medial pleura of the left hemithorax, along the anterior mediastinum is similar to prior, again measuring up to about 6 mm maximum thickness (image 31/series 2). Small volume loculated pleural fluid at the posterior left base is similar with 9 mm nodular component visible on 49/2. Musculoskeletal: No worrisome lytic or sclerotic osseous abnormality. CT ABDOMEN PELVIS FINDINGS Hepatobiliary: Stable right hepatic cyst. There is no evidence for gallstones, gallbladder wall thickening, or pericholecystic fluid. No intrahepatic or extrahepatic biliary dilation. Pancreas: No focal mass lesion. No dilatation of the main duct. No intraparenchymal cyst. No peripancreatic edema. Spleen: No splenomegaly. No focal mass lesion. Adrenals/Urinary Tract: No adrenal nodule or mass. 6 mm hypodensity interpolar right kidney is stable. Left kidney unremarkable. No evidence for hydroureter. Bladder decompressed. Stomach/Bowel: Large hiatal hernia. Duodenum is normally positioned as is the ligament of Treitz. No small bowel wall thickening. No small bowel dilatation. Status post right hemicolectomy. Patient is also status post  sigmoid colectomy with left lower quadrant end colostomy and Hartmann's pouch. Vascular/Lymphatic: There is abdominal aortic atherosclerosis without aneurysm. There is no gastrohepatic or hepatoduodenal ligament lymphadenopathy. No intraperitoneal or retroperitoneal lymphadenopathy. No pelvic sidewall lymphadenopathy. Reproductive: Calcified fibroids noted in the uterus. There is no adnexal mass. Other: No intraperitoneal free fluid. Musculoskeletal: Status post left hip replacement. No worrisome lytic or sclerotic osseous abnormality. IMPRESSION: 1. Stable exam. No new or progressive findings in the chest, abdomen, or pelvis to suggest recurrent or metastatic disease. 2. The area of mild irregular pleural thickening seen anteriorly in the medial left hemithorax is stable in the interval. The tiny loculated posterior left pleural fluid collection is also unchanged. Continued close attention recommended. 3. Emphysema. 4. Large hiatal hernia. 5. 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 6. Aortic Atherosclerosis (ICD10-I70.0) and Emphysema (ICD10-J43.9). Electronically Signed   By: EMisty StanleyM.D.   On: 08/16/2019 15:13    ASSESSMENT AND PLAN:  This is a very pleasant 78years old white female with metastatic  non-small cell lung cancer, adenocarcinoma diagnosed in March 2012 with positive EGFR mutation status post 23 months of treatment with Tarceva discontinued secondary to disease progression and development of EGFR T790M resistant mutation. The patient is currently on treatment with Tagrisso 80 mg by mouth daily status post 24 months.  The patient has been tolerating this treatment well. I recommended for her to continue her current treatment with Tagrisso with the same dose. For the hypertension I gave the patient a dose of clonidine 0.2 mg p.o. x1 in the clinic and she was advised to resume her blood pressure medication and to discuss with her primary  care physician for adjustment of medication if needed. For the anemia, I recommended for the patient to continue on oral iron tablets and I gave her prescription for Integra +1 capsule p.o. daily. For the history of breast cancer, she will continue on Femara. For the history of colon cancer, she has no evidence for disease recurrence, she will continue on observation for now. I will see her back for follow-up visit in 1 months for evaluation after repeating CT scan of the chest, abdomen and pelvis for restaging of her disease. The patient was advised to call immediately if she has any concerning symptoms in the interval. The patient voices understanding of current disease status and treatment options and is in agreement with the current care plan. All questions were answered. The patient knows to call the clinic with any problems, questions or concerns. We can certainly see the patient much sooner if necessary.  Disclaimer: This note was dictated with voice recognition software. Similar sounding words can inadvertently be transcribed and may not be corrected upon review.            [place] Telephone:(336) [phone]   Fax:(336) [phone]  OFFICE PROGRESS NOTE  Eber Hong, MD [address] 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 2) adenocarcinoma of the ascending colon (T2, N0, M0) with MSI high diagnosed in March 2017. 3) history of breast adenocarcinoma.  PRIOR THERAPY:  1) Tarceva 150 mg by mouth daily status post 68 months of treatment. 2) Status post right colectomy with lymph node dissection in May 2017.  CURRENT THERAPY: 1) Tagrisso 80 mg by mouth daily started 11/09/2016.  Status post 33 months of treatment. 2) observation for the history of colon  adenocarcinoma. 3) Femara 2.5 mg by mouth daily for history of breast cancer.  INTERVAL HISTORY: Kristin [name] 78 y.o. female returns to the clinic today for follow-up visit accompanied by her daughter [name].  The patient is feeling fine today with no concerning complaints except for mild fatigue and pain in the left hip area.  She had left hip replacement many years ago.  She denied having any current chest pain, shortness of breath, cough or hemoptysis.  She denied having any fever or chills.  She has no nausea, vomiting, diarrhea or constipation.  She has no headache or visual changes.  She continues to tolerate her treatment with Tagrisso fairly well.  The patient had repeat CT scan of the chest, abdomen pelvis performed recently and she is here for evaluation and discussion of her scan results.  MEDICAL HISTORY: Past Medical History:  Diagnosis Date   Allergy    Anemia    Anemia of chronic disease 09/21/2016   Anxiety  Arthritis    arthritis- left hip   Blood transfusion without reported diagnosis    transfusion- 3-4 yrs ago -found to be anemic on routine lab check   Breast cancer [facility]) 2002   bilateral- bilateral mastectomies done.   Clinical depression 12/02/2000   CVA (cerebral infarction) 04/30/2013   Encounter for antineoplastic chemotherapy 10/28/2016   Family history of brain cancer    Family history of breast cancer    Family history of colon cancer 10/06/2009   Family history of colon cancer    Family history of prostate cancer    GERD (gastroesophageal reflux disease)    Glaucoma    History of bilateral mastectomy 05/20/2010   Hypertension    Lung cancer ([place]) dx'd 12/2010   last Ct scan "no lung cancer" showing 11'16 CT Chest Epic.   Malignant neoplasm of ascending colon ([place]) 01/29/2016   Stroke ([place])    2 yrs ago-no residual   Tubular adenoma of colon 07/2011   colon polyps ans reoccurence with malignancy found    ALLERGIES:  is allergic  to doxycycline and sulfa antibiotics.  MEDICATIONS:  Current Outpatient Medications  Medication Sig Dispense Refill   alendronate (FOSAMAX) 70 MG tablet Take 70 mg once a week by mouth.     amLODipine (NORVASC) 5 MG tablet Take 1 tablet by mouth as directed. take one tablet in am and 1/2 tablet in pm-     brimonidine-timolol (COMBIGAN) 0.2-0.5 % ophthalmic solution Place 1 drop into the right eye every 12 (twelve) hours.     Cholecalciferol (VITAMIN D3) 2000 units capsule Take 2,000 Units daily by mouth.     clopidogrel (PLAVIX) 75 MG tablet Take 75 mg by mouth daily.     cycloSPORINE (RESTASIS) 0.05 % ophthalmic emulsion Place 1 drop into both eyes 2 (two) times daily.     escitalopram (LEXAPRO) 10 MG tablet Take 10 mg by mouth daily.      famotidine (PEPCID) 40 MG tablet Take 40 mg by mouth daily.     feeding supplement, ENSURE ENLIVE, (ENSURE ENLIVE) LIQD Take 237 mLs by mouth 2 (two) times daily between meals. 237 mL 12   FeFum-FePoly-FA-B Cmp-C-Biot (FOLIVANE-PLUS) CAPS TAKE 1 CAPSULE BY MOUTH EVERY DAY IN THE MORNING 30 capsule 2   letrozole (FEMARA) 2.5 MG tablet TAKE 1 TABLET BY MOUTH EVERY DAY 90 tablet 1   methimazole (TAPAZOLE) 10 MG tablet      Multiple Vitamins-Iron (MULTIVITAMINS WITH IRON) TABS tablet Take 1 tablet by mouth daily.  0   osimertinib mesylate (TAGRISSO) 80 MG tablet Take 1 tablet (80 mg total) by mouth daily. 30 tablet 3   pantoprazole (PROTONIX) 40 MG tablet Take by mouth.     Current Facility-Administered Medications  Medication Dose Route Frequency Provider Last Rate Last Dose   0.9 %  sodium chloride infusion  500 mL Intravenous Continuous Armbruster, Carlota Raspberry, MD        SURGICAL HISTORY:  Past Surgical History:  Procedure Laterality Date   APPENDECTOMY     BOWEL DECOMPRESSION N/A 06/12/2018   Procedure: BOWEL DECOMPRESSION;  Surgeon: Irving Copas., MD;  Location: [facility];  Service: Gastroenterology;  Laterality: N/A;    cataracts Bilateral    COLECTOMY WITH COLOSTOMY CREATION/HARTMANN PROCEDURE  06/16/2018   Procedure: CREATION/HARTMANN COLOSTOMY;  Surgeon: Michael Boston, MD;  [facility];  Service: General;;   COLONOSCOPY     COLOSTOMY N/A 06/16/2018   Procedure: POSSIBLE COLOSTOMY;  Surgeon: Michael Boston, MD;  Location:  [facility];  Service: General;  Laterality: N/A;   EYE SURGERY     FLEXIBLE SIGMOIDOSCOPY N/A 06/12/2018   Procedure: FLEXIBLE SIGMOIDOSCOPY;  Surgeon: Rush Landmark Telford Nab., MD;  [facility];  Service: Gastroenterology;  Laterality: N/A;   LAPAROSCOPIC SIGMOID COLECTOMY N/A 06/16/2018   Procedure: LAPAROSCOPIC LOW ANTERIOR RESECTION;  Surgeon: Michael Boston, MD;  [facility];  Service: General;  Laterality: N/A;   LUNG SURGERY     Resection -" not done. Pt . tx with Tarceva   MASTECTOMY Bilateral    MEDIASTINOSCOPY N/A 10/02/2016   Procedure: MEDIASTINOSCOPY;  Surgeon: [name], MD;  Location: [facility];  Service: Thoracic;  Laterality: N/A;   RECONSTRUCTION BREAST W/ TRAM FLAP Bilateral    bilaterally   robotic right hemicolectomy Right 03/06/2016   Dr. Johney Maine   TEE WITHOUT CARDIOVERSION N/A 06/04/2013   Procedure: TRANSESOPHAGEAL ECHOCARDIOGRAM (TEE);  Surgeon: Jolaine Artist, MD;  Location: [place];  Service: Cardiovascular;  Laterality: N/A;   TONSILLECTOMY     TOTAL HIP ARTHROPLASTY Left 08/07/2016   Procedure: LEFT TOTAL HIP ARTHROPLASTY ANTERIOR APPROACH;  Surgeon: Gaynelle Arabian, MD;  [facility];  Service: Orthopedics;  Laterality: Left;    REVIEW OF SYSTEMS:  Constitutional: positive for fatigue Eyes: negative Ears, nose, mouth, throat, and face: negative Respiratory: negative Cardiovascular: negative Gastrointestinal: negative Genitourinary:negative Integument/breast: negative Hematologic/lymphatic: negative Musculoskeletal:positive for arthralgias Neurological: negative Behavioral/Psych: negative Endocrine:  negative Allergic/Immunologic: negative   PHYSICAL EXAMINATION: General appearance: alert, cooperative, fatigued and no distress Head: Normocephalic, without obvious abnormality, atraumatic Neck: no adenopathy, no JVD, supple, symmetrical, trachea midline and thyroid not enlarged, symmetric, no tenderness/mass/nodules Lymph nodes: Cervical, supraclavicular, and axillary nodes normal. Resp: clear to auscultation bilaterally Back: symmetric, no curvature. ROM normal. No CVA tenderness. Cardio: regular rate and rhythm, S1, S2 normal, no murmur, click, rub or gallop GI: soft, non-tender; bowel sounds normal; no masses,  no organomegaly Extremities: extremities normal, atraumatic, no cyanosis or edema Neurologic: Alert and oriented X 3, normal strength and tone. Normal symmetric reflexes. Normal coordination and gait  ECOG PERFORMANCE STATUS: 1 - Symptomatic but completely ambulatory  Blood pressure (!) 152/72, pulse 79, temperature 98 F (36.7 C), temperature source Temporal, resp. rate 17, height 5' 6" (1.676 m), weight 135 lb (61.2 kg), SpO2 100 %.  LABORATORY DATA: Lab Results  Component Value Date   WBC 5.0 08/16/2019   HGB 11.8 (L) 08/16/2019   HCT 37.6 08/16/2019   MCV 92.6 08/16/2019   PLT 256 08/16/2019      Chemistry      Component Value Date/Time   NA 142 08/16/2019 1014   NA 138 09/26/2017 0942   K 4.4 08/16/2019 1014   K 5.2 No visable hemolysis (H) 09/26/2017 0942   CL 107 08/16/2019 1014   CL 107 04/05/2013 0754   CO2 24 08/16/2019 1014   CO2 26 09/26/2017 0942   BUN 16 08/16/2019 1014   BUN 16.0 09/26/2017 0942   CREATININE 1.23 (H) 08/16/2019 1014   CREATININE 1.2 (H) 09/26/2017 0942      Component Value Date/Time   CALCIUM 10.5 (H) 08/16/2019 1014   CALCIUM 9.9 09/26/2017 0942   ALKPHOS 85 08/16/2019 1014   ALKPHOS 68 09/26/2017 0942   AST 21 08/16/2019 1014   AST 18 09/26/2017 0942   ALT 17 08/16/2019 1014   ALT 13 09/26/2017 0942   BILITOT 0.5  08/16/2019 1014   BILITOT 0.31 09/26/2017 0942       RADIOGRAPHIC STUDIES:  Ct Chest W Contrast  Result Date: 08/16/2019 CLINICAL DATA:  Non-small-cell lung cancer. Restaging. EXAM: CT CHEST, ABDOMEN, AND PELVIS WITH CONTRAST TECHNIQUE: Multidetector CT imaging of the chest, abdomen and pelvis was performed following the standard protocol during bolus administration of intravenous contrast. CONTRAST:  130m OMNIPAQUE IOHEXOL 300 MG/ML  SOLN COMPARISON:  04/22/2019 FINDINGS: CT CHEST FINDINGS Cardiovascular: The heart size is normal. No substantial pericardial effusion. Atherosclerotic calcification is noted in the wall of the thoracic aorta. Mediastinum/Nodes: No mediastinal lymphadenopathy. There is no hilar lymphadenopathy. The esophagus has normal imaging features. Tiny bilateral thyroid nodules are stable. There is no axillary lymphadenopathy. Lungs/Pleura: Centrilobular emphsyema noted. Surgical scarring noted left apex. No suspicious pulmonary nodule or mass in the right lung. The irregular plaque-like soft tissue seen in the medial pleura of the left hemithorax, along the anterior mediastinum is similar to prior, again measuring up to about 6 mm maximum thickness (image 31/series 2). Small volume loculated pleural fluid at the posterior left base is similar with 9 mm nodular component visible on 49/2. Musculoskeletal: No worrisome lytic or sclerotic osseous abnormality. CT ABDOMEN PELVIS FINDINGS Hepatobiliary: Stable right hepatic cyst. There is no evidence for gallstones, gallbladder wall thickening, or pericholecystic fluid. No intrahepatic or extrahepatic biliary dilation. Pancreas: No focal mass lesion. No dilatation of the main duct. No intraparenchymal cyst. No peripancreatic edema. Spleen: No splenomegaly. No focal mass lesion. Adrenals/Urinary Tract: No adrenal nodule or mass. 6 mm hypodensity interpolar right kidney is stable. Left kidney unremarkable. No evidence for hydroureter. Bladder  decompressed. Stomach/Bowel: Large hiatal hernia. Duodenum is normally positioned as is the ligament of Treitz. No small bowel wall thickening. No small bowel dilatation. Status post right hemicolectomy. 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 Vascular/Lymphatic: There is abdominal aortic atherosclerosis without aneurysm. There is no gastrohepatic or hepatoduodenal ligament lymphadenopathy. No intraperitoneal or retroperitoneal lymphadenopathy. No pelvic sidewall lymphadenopathy. Reproductive: Calcified fibroids noted in the uterus. There is no adnexal mass. Other: No intraperitoneal free fluid. Musculoskeletal: Status post left hip replacement. No worrisome lytic or sclerotic osseous abnormality. IMPRESSION: 1. Stable exam. No new or progressive findings in the chest, abdomen, or pelvis to suggest recurrent or metastatic disease. 2. The area of mild irregular pleural thickening seen anteriorly in the medial left hemithorax is stable in the interval. The tiny loculated posterior left pleural fluid collection is also unchanged. Continued close attention recommended. 3. Emphysema. 4. Large hiatal hernia. 5. 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 6. Aortic Atherosclerosis (ICD10-I70.0) and Emphysema (ICD10-J43.9). Electronically Signed   By: EMisty StanleyM.D.   On: 08/16/2019 15:13   Ct Abdomen Pelvis W Contrast  Result Date: 08/16/2019 CLINICAL DATA:  Non-small-cell lung cancer. Restaging. EXAM: CT CHEST, ABDOMEN, AND PELVIS WITH CONTRAST TECHNIQUE: Multidetector CT imaging of the chest, abdomen and pelvis was performed following the standard protocol during bolus administration of intravenous contrast. CONTRAST:  1074mOMNIPAQUE IOHEXOL 300 MG/ML  SOLN COMPARISON:  04/22/2019 FINDINGS: CT CHEST FINDINGS Cardiovascular: The heart size is normal. No substantial pericardial effusion.  Atherosclerotic calcification is noted in the wall of the thoracic aorta. Mediastinum/Nodes: No mediastinal lymphadenopathy. There is no hilar lymphadenopathy. The esophagus has normal imaging features. Tiny bilateral thyroid nodules are stable. There is no axillary lymphadenopathy. Lungs/Pleura: Centrilobular emphsyema noted. Surgical scarring noted left apex. No suspicious pulmonary nodule or mass in the right lung. The irregular plaque-like soft tissue seen in the medial  pleura of the left hemithorax, along the anterior mediastinum is similar to prior, again measuring up to about 6 mm maximum thickness (image 31/series 2). Small volume loculated pleural fluid at the posterior left base is similar with 9 mm nodular component visible on 49/2. Musculoskeletal: No worrisome lytic or sclerotic osseous abnormality. CT ABDOMEN PELVIS FINDINGS Hepatobiliary: Stable right hepatic cyst. There is no evidence for gallstones, gallbladder wall thickening, or pericholecystic fluid. No intrahepatic or extrahepatic biliary dilation. Pancreas: No focal mass lesion. No dilatation of the main duct. No intraparenchymal cyst. No peripancreatic edema. Spleen: No splenomegaly. No focal mass lesion. Adrenals/Urinary Tract: No adrenal nodule or mass. 6 mm hypodensity interpolar right kidney is stable. Left kidney unremarkable. No evidence for hydroureter. Bladder decompressed. Stomach/Bowel: Large hiatal hernia. Duodenum is normally positioned as is the ligament of Treitz. No small bowel wall thickening. No small bowel dilatation. Status post right hemicolectomy. 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 Vascular/Lymphatic: There is abdominal aortic atherosclerosis without aneurysm. There is no gastrohepatic or hepatoduodenal ligament lymphadenopathy. No intraperitoneal or retroperitoneal lymphadenopathy. No pelvic sidewall lymphadenopathy. Reproductive: Calcified fibroids  noted in the uterus. There is no adnexal mass. Other: No intraperitoneal free fluid. Musculoskeletal: Status post left hip replacement. No worrisome lytic or sclerotic osseous abnormality. IMPRESSION: 1. Stable exam. No new or progressive findings in the chest, abdomen, or pelvis to suggest recurrent or metastatic disease. 2. The area of mild irregular pleural thickening seen anteriorly in the medial left hemithorax is stable in the interval. The tiny loculated posterior left pleural fluid collection is also unchanged. Continued close attention recommended. 3. Emphysema. 4. Large hiatal hernia. 5. 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 6. Aortic Atherosclerosis (ICD10-I70.0) and Emphysema (ICD10-J43.9). Electronically Signed   By: Misty Stanley M.D.   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 The patient is currently on treatment with Tagrisso 80 mg by mouth daily status post 33 months.  The patient has been tolerating this treatment well with no concerning adverse effects. She had repeat CT scan of the chest, abdomen pelvis performed recently.  I personally and independently reviewed the scans and discussed the results with the patient and her daughter. Her scan showed no concerning findings for disease progression. I recommended for the patient to continue her current treatment with Tagrisso. I will see her back for follow-up visit in 2 months for evaluation and repeat blood work. For the anemia, I recommended for the patient to continue on oral iron tablets and I gave her prescription for Integra +1 capsule p.o. daily. For the history of breast  cancer, she will continue on Femara. For the history of colon cancer, she has no evidence for disease recurrence, she will continue on observation for now. For hypertension she was advised to take her blood pressure medication as prescribed. She was advised to call immediately if she has any concerning symptoms in the interval. The patient voices understanding of current disease status and treatment options and is in agreement with the current care plan. All questions were answered. The patient knows to call the clinic with any problems, questions or concerns. We can certainly  see the patient much sooner if necessary.  Disclaimer: This note was dictated with voice recognition software. Similar sounding words can inadvertently be transcribed and may not be corrected upon review.

## 2019-08-27 ENCOUNTER — Telehealth: Payer: Self-pay

## 2019-08-27 NOTE — Telephone Encounter (Signed)
Oral Oncology Patient Advocate Encounter  Tagrisso patient assistance with [employer] will expire 10/21/19  I called [employer] and confirmed that the patient can call [employer] at [phone] and renew for 2021 over the phone.  I called the patient and left a detailed voicemail.  [name] Phone [phone] Fax [phone] 08/27/2019   10:31 AM

## 2019-09-10 ENCOUNTER — Other Ambulatory Visit: Payer: Self-pay | Admitting: Internal Medicine

## 2019-09-20 IMAGING — CT CT ABD-PELV W/ CM
2 of 5 series · 15 of 46 positions shown, 17 images · IV contrast (ISOVUE)
Comparison: 05/04/2018 chest CT

CLINICAL DATA: Persistent lower abdominal pain and pressure.
History of right colectomy for ascending colon cancer.

EXAM:
CT ABDOMEN AND PELVIS WITH CONTRAST
TECHNIQUE: Multidetector CT imaging of the abdomen and pelvis was performed
using the standard protocol following bolus administration of
intravenous contrast.
CONTRAST:  80mL U8AELF-PKK IOPAMIDOL (U8AELF-PKK) INJECTION 61%

[Series 2: axial st · axial · 0.85mm/px · z∈[-532,-122]mm · 12 of 96 slices shown, 14 images]
[im 7/96  soft-tissue]
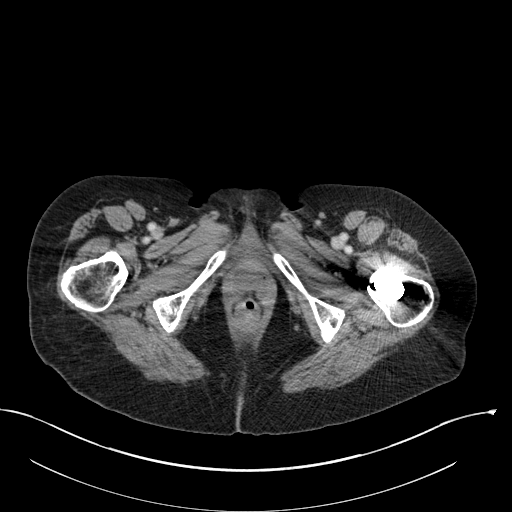
[im 7/96  bone]
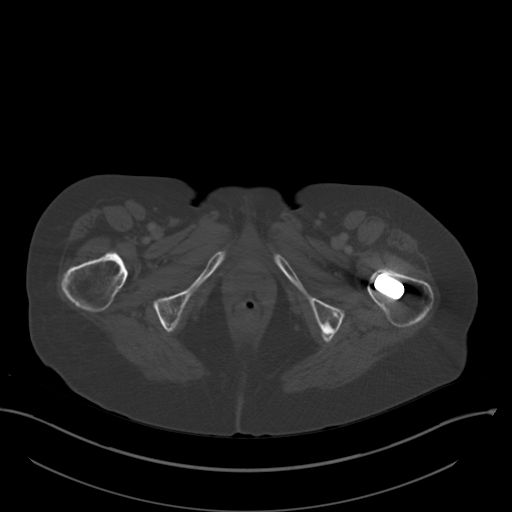
[im 14/96  soft-tissue]
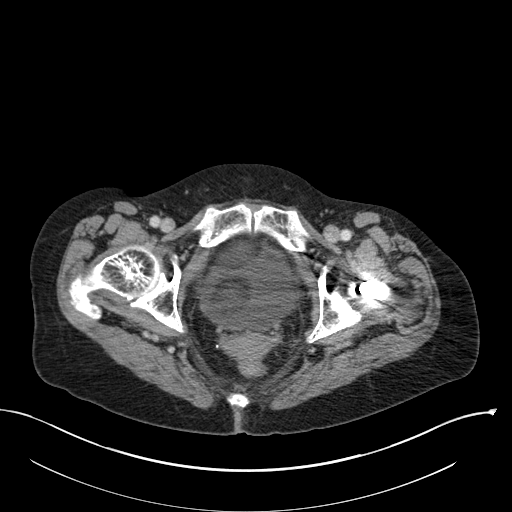
[im 21/96  soft-tissue]
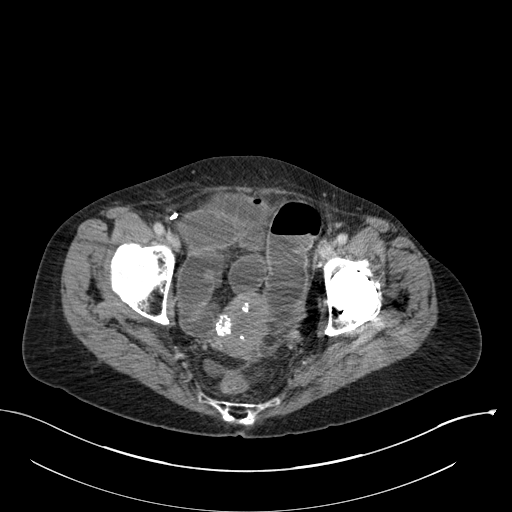
[im 28/96  soft-tissue]
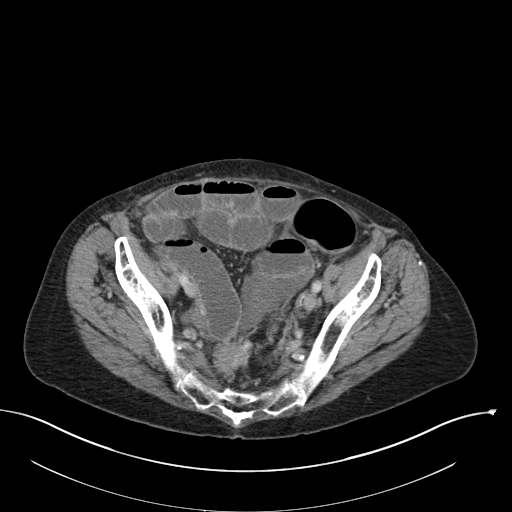
[im 34/96  soft-tissue]
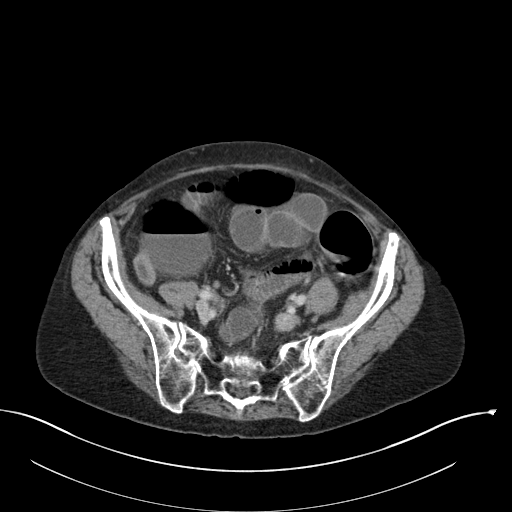
[im 41/96  soft-tissue]
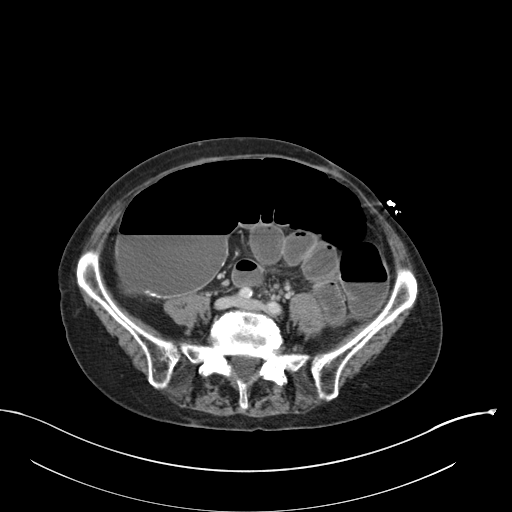
[im 55/96  soft-tissue]
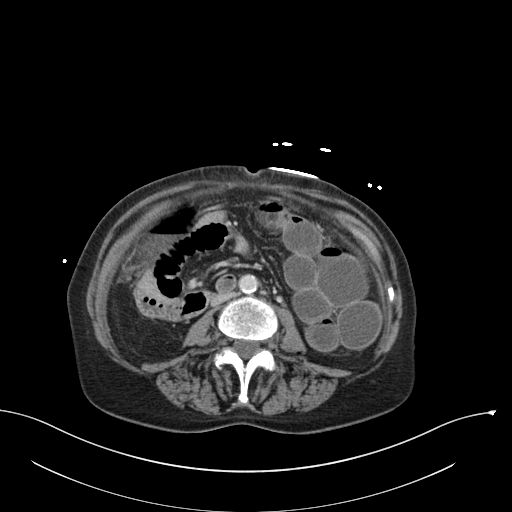
[im 62/96  soft-tissue]
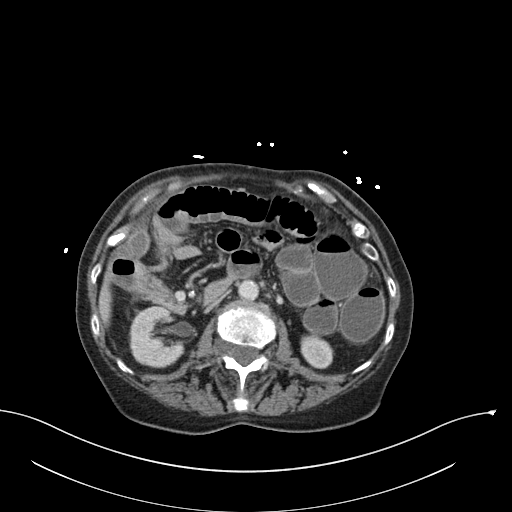
[im 68/96  soft-tissue]
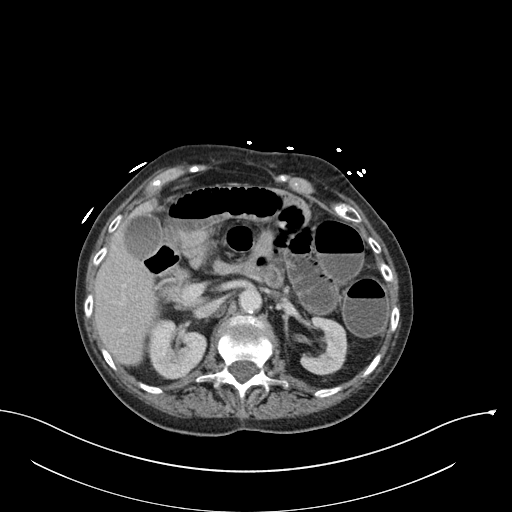
[im 68/96  bone]
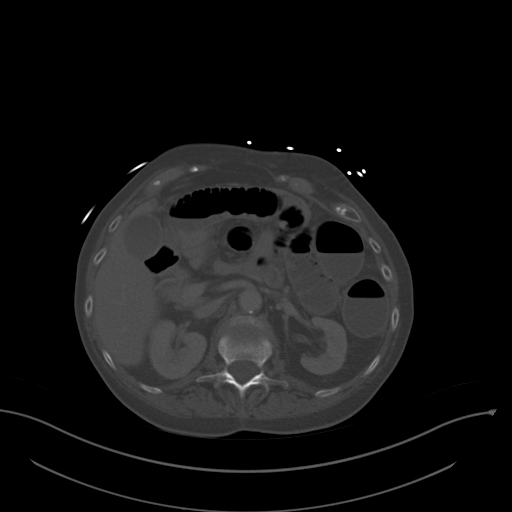
[im 75/96  soft-tissue]
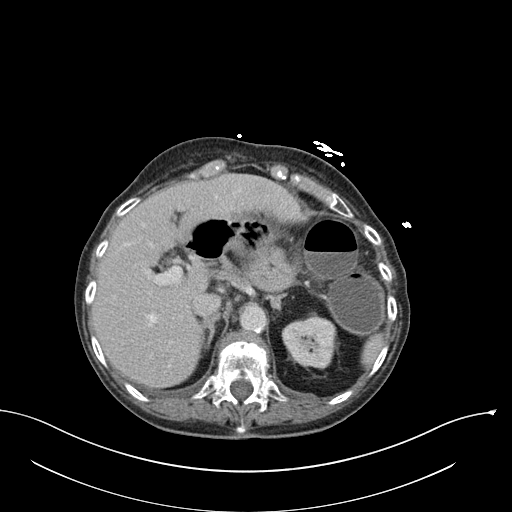
[im 82/96  soft-tissue]
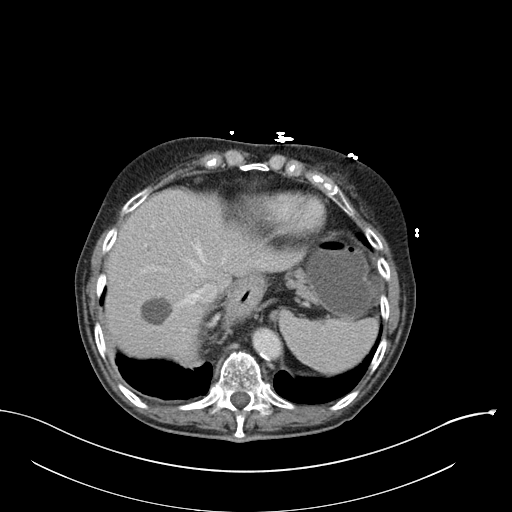
[im 89/96  soft-tissue]
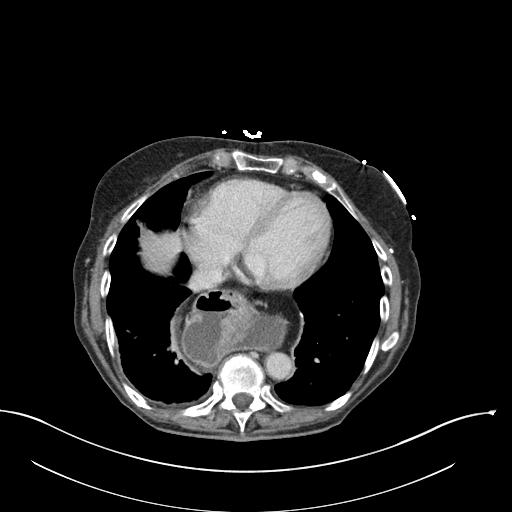

[Series 5: coronal st · coronal · 0.67mm/px · 3 of 91 slices shown]
[im 31/91  soft-tissue]
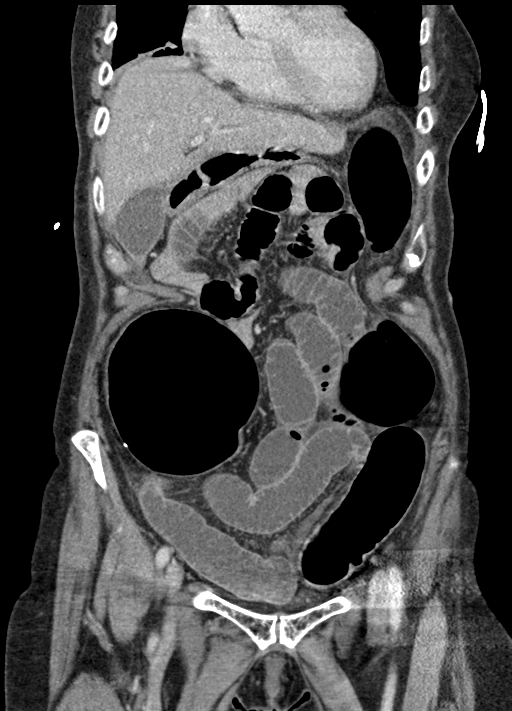
[im 41/91  soft-tissue]
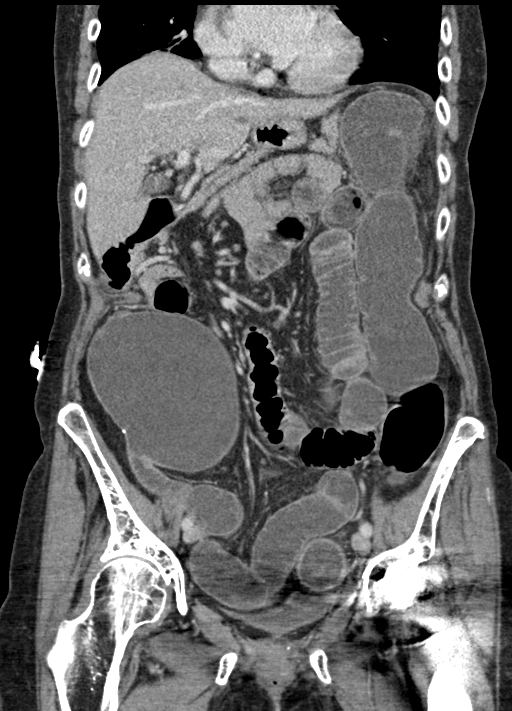
[im 51/91  soft-tissue]
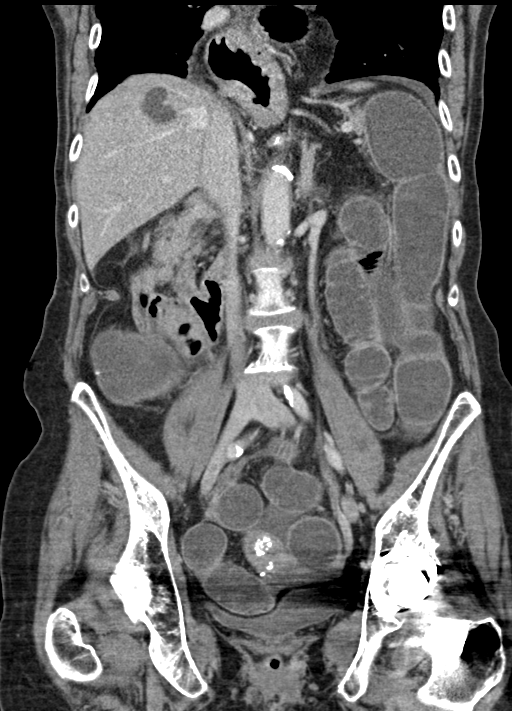

[15 of 46 positions shown; findings below may reference images not displayed]

FINDINGS: Lower chest: Normal heart size. No pericardial effusion. Bibasilar
dependent atelectasis. Moderate-sized hiatal hernia.

Hepatobiliary: Stable 2.6 cm cyst in the right hepatic lobe. No
enhancing mass lesions of the liver. Physiologic distention of the
gallbladder without mural thickening or pericholecystic fluid. No
biliary dilatation.

Pancreas: No ductal dilatation, mass inflammation.  Mild atrophy.

Spleen: No splenomegaly or mass.

Adrenals/Urinary Tract: Normal bilateral adrenal glands. Symmetric
cortical enhancement of both kidneys with small interpolar cyst in
the right kidney unchanged in appearance measuring approximately 4
mm. No nephrolithiasis nor hydroureteronephrosis. Urinary bladder is
physiologically distended without focal mural thickening or calculi.

Stomach/Bowel: Moderate-sized hiatal hernia. Nondistended stomach.
Diffuse fluid-filled distended small and large bowel loops are
identified to the level of the rectosigmoid where there is segmental
transmural thickening noted causing large and small bowel
obstruction. Dilatation of the colon to 9 cm is noted. The patient
is status post right colectomy. Although findings may be secondary
to a severe colitis, neoplasm is of concern.

Vascular/Lymphatic: Aortoiliac atherosclerosis without aneurysm. No
adenopathy.

Reproductive: Fibroid uterus.  No adnexal mass.

Other: No free air nor free fluid.

Musculoskeletal: Degenerative changes are noted along the dorsal
spine without acute nor aggressive osseous lesions.
IMPRESSION: 1. Short segmental thickening in the rectosigmoid causing large and
small-bowel obstruction. Clinical correlation to exclude neoplasm is
recommended. Marked inflammatory thickening is a differential
possibility. Dilatation of fluid-filled large bowel is noted to 9
cm.
2. Status post right partial colectomy.  Intact staple line.
3. Stable 2.6 cm in the right hepatic lobe compatible with a cyst.
4. Moderate-sized hiatal hernia.

## 2019-09-22 IMAGING — DX DG ABD PORTABLE 1V
1 series · 1 of 1 positions shown · non-contrast
Comparison: 06/12/2018

CLINICAL DATA: Followup large bowel obstruction.

EXAM:
PORTABLE ABDOMEN - 1 VIEW

[abdomen kub]
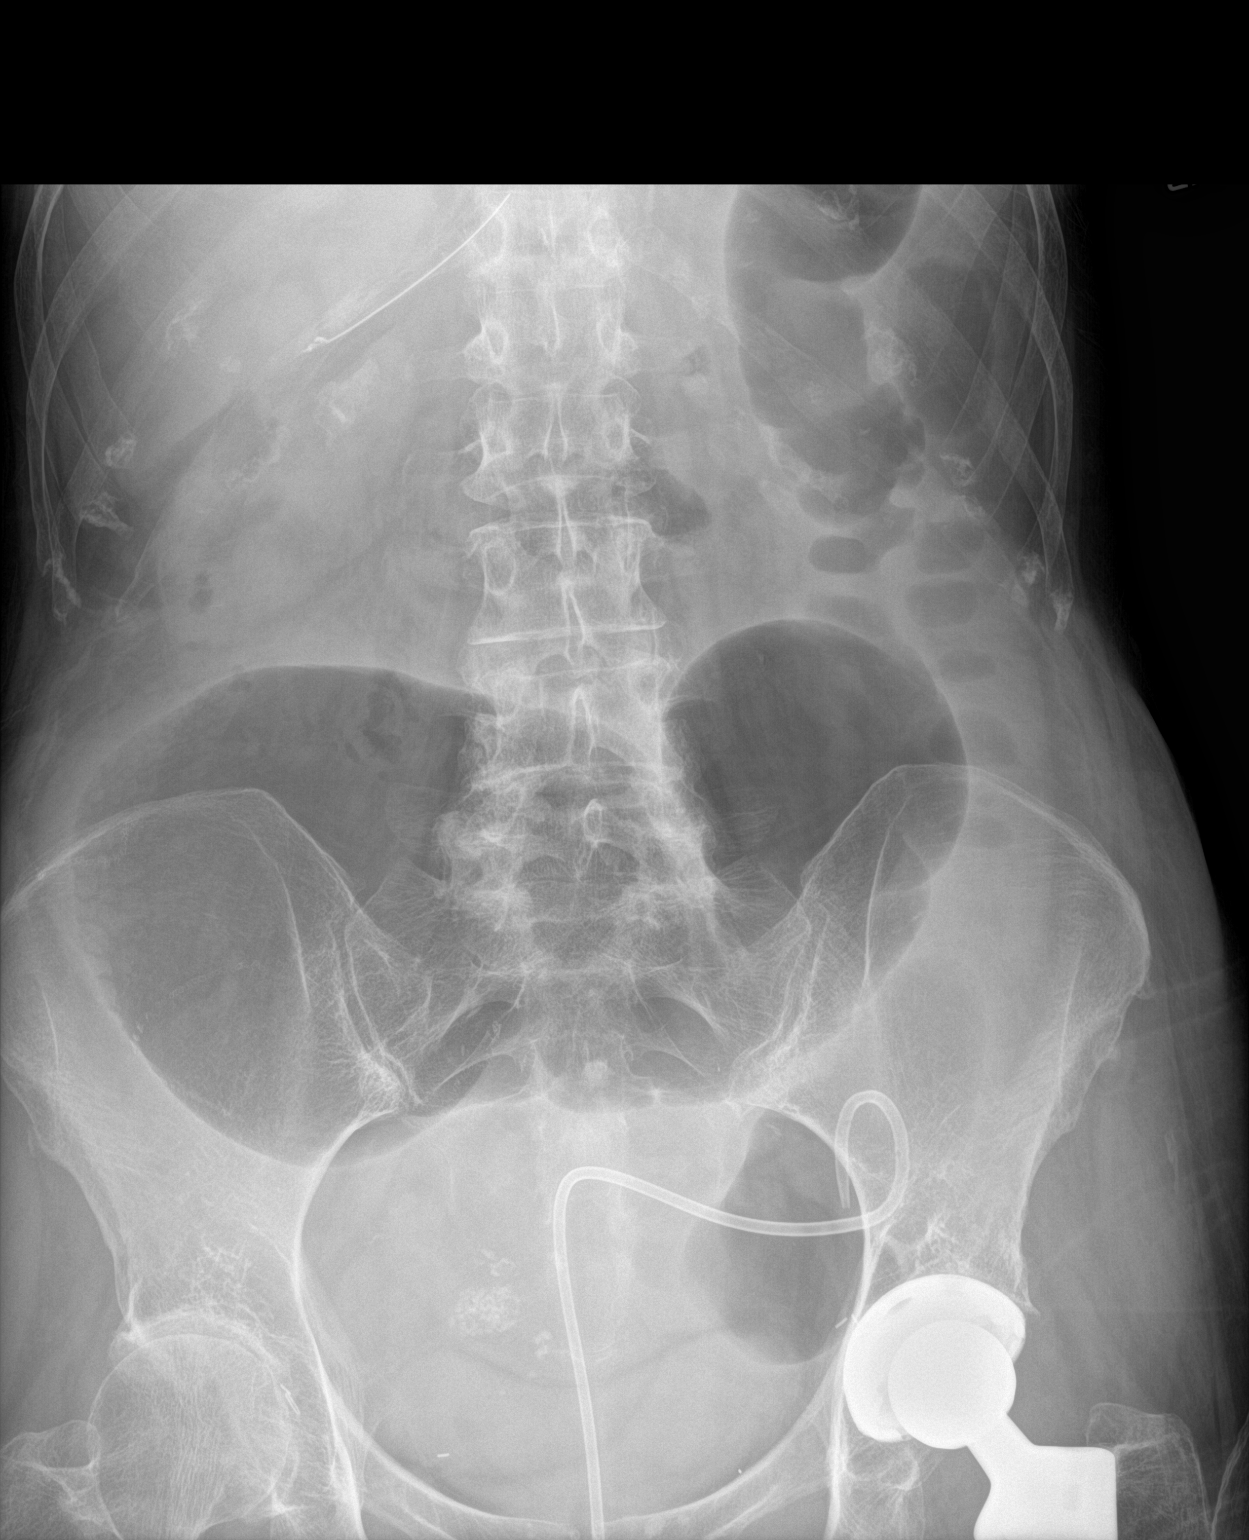

[1 of 1 positions shown; findings below may reference images not displayed]

FINDINGS: Nasogastric tube tip in the antrum of the stomach. Small bore
drainage catheter inserted per rectum with its tip in the sigmoid
region. Dilated postsurgical proximal colon again demonstrated,
maximal diameter 14 cm today, the same as yesterday. Small bowel
pattern is unremarkable.
IMPRESSION: Sigmoid decompression tube in place. Continued gaseous distension of
the postsurgical proximal colon with maximal diameter 14 cm

## 2019-09-22 IMAGING — CR DG ABDOMEN 2V
1 series · 1 of 1 positions shown · non-contrast
Comparison: Abdominal radiograph from earlier today

CLINICAL DATA: Abdominal pain, diarrhea, colonic obstruction

EXAM:
ABDOMEN - 2 VIEW

[t abdomen supine]
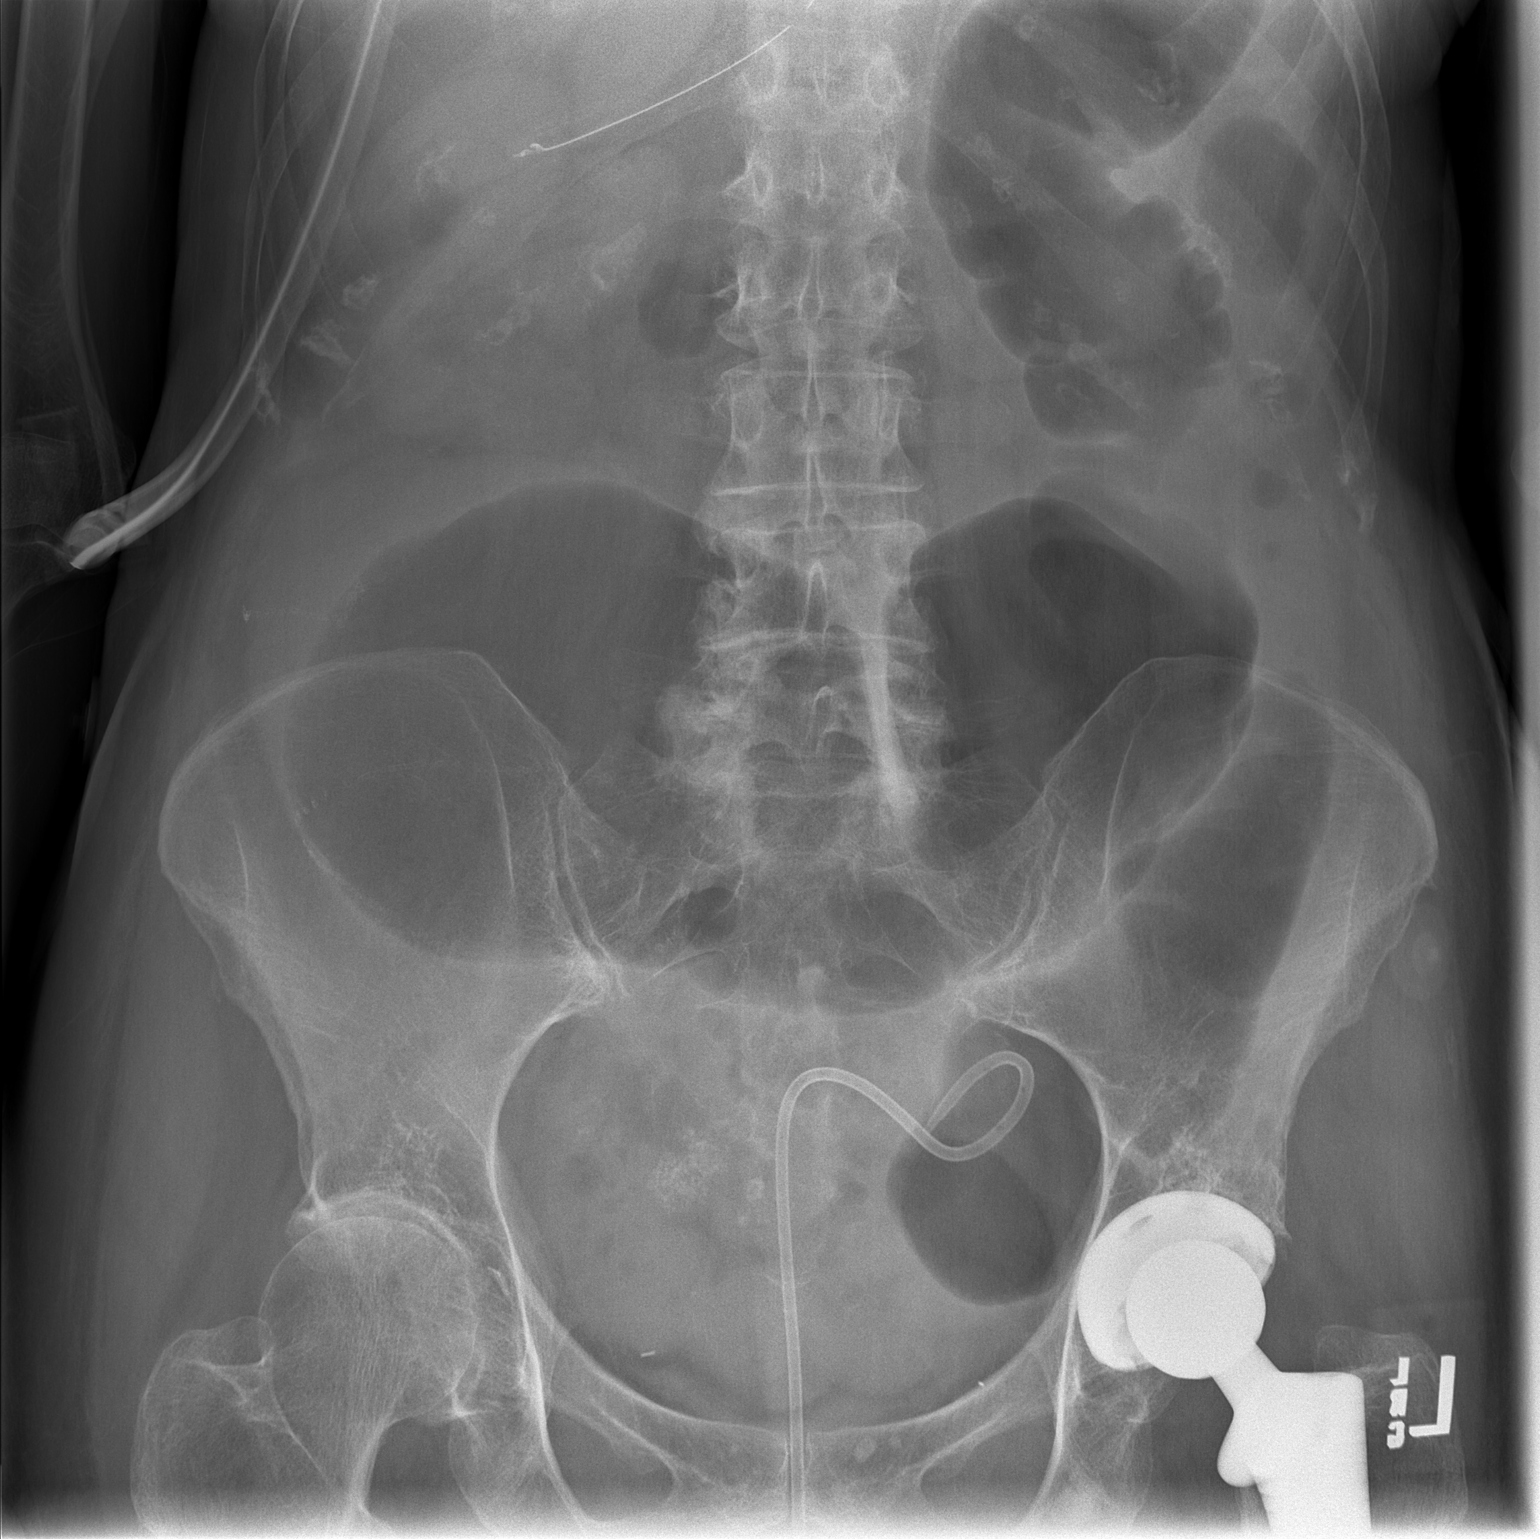

[1 of 1 positions shown; findings below may reference images not displayed]

FINDINGS: Enteric tube terminates in distal stomach. Rectal catheter
terminates in left deep pelvis. No evidence of pneumatosis or
pneumoperitoneum. Persistent marked gaseous distension of remnant
large bowel measuring up to 13.4 cm diameter in the proximal colon,
not appreciably changed. No disproportionately dilated small bowel
loops. No radiopaque nephrolithiasis. Mild bibasilar scarring versus
atelectasis. Left total hip arthroplasty.
IMPRESSION: 1. Enteric tube terminates in the distal stomach.
2. Rectal catheter terminates in the left deep pelvis probably near
the rectosigmoid junction.
3. No appreciable change in marked gaseous distention of the remnant
large bowel, most prominent in the proximal remnant colon. No
evidence of free air.

## 2019-10-08 ENCOUNTER — Telehealth: Payer: Self-pay

## 2019-10-08 NOTE — Telephone Encounter (Signed)
Oral Oncology Patient Advocate Encounter  Patient has been approved for [employer] Patient Assistance Program in an effort to reduce the patient's out of pocket expense for Tagrisso to $0.      [employer] patient assistance phone number for follow up is [phone].   This encounter will be updated until final determination.  [facility] [place] Phone [phone] Fax [phone] 10/08/2019 1:22 PM

## 2019-10-25 ENCOUNTER — Other Ambulatory Visit: Payer: Self-pay

## 2019-10-25 ENCOUNTER — Telehealth: Payer: Self-pay | Admitting: Internal Medicine

## 2019-10-25 ENCOUNTER — Inpatient Hospital Stay: Payer: Medicare Other | Attending: Internal Medicine | Admitting: Internal Medicine

## 2019-10-25 ENCOUNTER — Inpatient Hospital Stay: Payer: Medicare Other

## 2019-10-25 ENCOUNTER — Encounter: Payer: Self-pay | Admitting: Internal Medicine

## 2019-10-25 VITALS — BP 163/54 | HR 63 | Temp 98.0°F | Resp 17 | Ht 66.0 in | Wt 135.3 lb

## 2019-10-25 DIAGNOSIS — C50919 Malignant neoplasm of unspecified site of unspecified female breast: Secondary | ICD-10-CM | POA: Insufficient documentation

## 2019-10-25 DIAGNOSIS — Z79811 Long term (current) use of aromatase inhibitors: Secondary | ICD-10-CM | POA: Insufficient documentation

## 2019-10-25 DIAGNOSIS — D649 Anemia, unspecified: Secondary | ICD-10-CM | POA: Diagnosis not present

## 2019-10-25 DIAGNOSIS — C182 Malignant neoplasm of ascending colon: Secondary | ICD-10-CM | POA: Diagnosis not present

## 2019-10-25 DIAGNOSIS — Z803 Family history of malignant neoplasm of breast: Secondary | ICD-10-CM | POA: Insufficient documentation

## 2019-10-25 DIAGNOSIS — I10 Essential (primary) hypertension: Secondary | ICD-10-CM | POA: Diagnosis not present

## 2019-10-25 DIAGNOSIS — Z170 Estrogen receptor positive status [ER+]: Secondary | ICD-10-CM | POA: Insufficient documentation

## 2019-10-25 DIAGNOSIS — C3490 Malignant neoplasm of unspecified part of unspecified bronchus or lung: Secondary | ICD-10-CM | POA: Diagnosis not present

## 2019-10-25 DIAGNOSIS — Z8673 Personal history of transient ischemic attack (TIA), and cerebral infarction without residual deficits: Secondary | ICD-10-CM | POA: Insufficient documentation

## 2019-10-25 DIAGNOSIS — Z800 Family history of malignant neoplasm of digestive organs: Secondary | ICD-10-CM | POA: Diagnosis not present

## 2019-10-25 DIAGNOSIS — Z79899 Other long term (current) drug therapy: Secondary | ICD-10-CM | POA: Insufficient documentation

## 2019-10-25 DIAGNOSIS — C3492 Malignant neoplasm of unspecified part of left bronchus or lung: Secondary | ICD-10-CM

## 2019-10-25 DIAGNOSIS — Z85038 Personal history of other malignant neoplasm of large intestine: Secondary | ICD-10-CM | POA: Insufficient documentation

## 2019-10-25 DIAGNOSIS — Z9221 Personal history of antineoplastic chemotherapy: Secondary | ICD-10-CM | POA: Insufficient documentation

## 2019-10-25 DIAGNOSIS — Z8042 Family history of malignant neoplasm of prostate: Secondary | ICD-10-CM | POA: Insufficient documentation

## 2019-10-25 DIAGNOSIS — C3412 Malignant neoplasm of upper lobe, left bronchus or lung: Secondary | ICD-10-CM | POA: Insufficient documentation

## 2019-10-25 DIAGNOSIS — Z9013 Acquired absence of bilateral breasts and nipples: Secondary | ICD-10-CM | POA: Insufficient documentation

## 2019-10-25 DIAGNOSIS — Z5111 Encounter for antineoplastic chemotherapy: Secondary | ICD-10-CM

## 2019-10-25 LAB — CBC WITH DIFFERENTIAL (CANCER CENTER ONLY)
Abs Immature Granulocytes: 0.01 10*3/uL (ref 0.00–0.07)
Basophils Absolute: 0 10*3/uL (ref 0.0–0.1)
Basophils Relative: 1 %
Eosinophils Absolute: 0.1 10*3/uL (ref 0.0–0.5)
Eosinophils Relative: 2 %
HCT: 35 % — ABNORMAL LOW (ref 36.0–46.0)
Hemoglobin: 11.2 g/dL — ABNORMAL LOW (ref 12.0–15.0)
Immature Granulocytes: 0 %
Lymphocytes Relative: 16 %
Lymphs Abs: 0.8 10*3/uL (ref 0.7–4.0)
MCH: 29.8 pg (ref 26.0–34.0)
MCHC: 32 g/dL (ref 30.0–36.0)
MCV: 93.1 fL (ref 80.0–100.0)
Monocytes Absolute: 0.7 10*3/uL (ref 0.1–1.0)
Monocytes Relative: 13 %
Neutro Abs: 3.6 10*3/uL (ref 1.7–7.7)
Neutrophils Relative %: 68 %
Platelet Count: 244 10*3/uL (ref 150–400)
RBC: 3.76 MIL/uL — ABNORMAL LOW (ref 3.87–5.11)
RDW: 13.4 % (ref 11.5–15.5)
WBC Count: 5.3 10*3/uL (ref 4.0–10.5)
nRBC: 0 % (ref 0.0–0.2)

## 2019-10-25 LAB — CMP (CANCER CENTER ONLY)
ALT: 18 U/L (ref 0–44)
AST: 19 U/L (ref 15–41)
Albumin: 4 g/dL (ref 3.5–5.0)
Alkaline Phosphatase: 83 U/L (ref 38–126)
Anion gap: 12 (ref 5–15)
BUN: 17 mg/dL (ref 8–23)
CO2: 25 mmol/L (ref 22–32)
Calcium: 9.8 mg/dL (ref 8.9–10.3)
Chloride: 102 mmol/L (ref 98–111)
Creatinine: 1.22 mg/dL — ABNORMAL HIGH (ref 0.44–1.00)
GFR, Est AFR Am: 48 mL/min — ABNORMAL LOW (ref >60)
GFR, Estimated: 42 mL/min — ABNORMAL LOW (ref >60)
Glucose, Bld: 104 mg/dL — ABNORMAL HIGH (ref 70–99)
Potassium: 4.2 mmol/L (ref 3.5–5.1)
Sodium: 139 mmol/L (ref 135–145)
Total Bilirubin: 0.6 mg/dL (ref 0.3–1.2)
Total Protein: 6.6 g/dL (ref 6.5–8.1)

## 2019-10-25 NOTE — Progress Notes (Signed)
[place] Telephone:(336) [phone]   Fax:(336) [phone]  OFFICE PROGRESS NOTE  Eber Hong, MD [address] 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 2) adenocarcinoma of the ascending colon (T2, N0, M0) with MSI high diagnosed in March 2017. 3) history of breast adenocarcinoma.  PRIOR THERAPY:  1) Tarceva 150 mg by mouth daily status post 68 months of treatment. 2) Status post right colectomy with lymph node dissection in May 2017.  CURRENT THERAPY: 1) Tagrisso 80 mg by mouth daily started 11/09/2016.  Status post 24 months of treatment. 2) observation for the history of colon adenocarcinoma. 3) Femara 2.5 mg by mouth daily for history of breast cancer.  INTERVAL HISTORY: Kristin [name] 79 y.o. female returns to the clinic today for follow-up visit.  The patient is feeling fine today with no concerning complaints except for stress after her son was recently diagnosed with metastatic pancreatic cancer.  Her blood pressure is elevated today and she was not taking any of her blood pressure medication since discharge from the hospital.  She denied having any chest pain, shortness breath, cough or hemoptysis.  She denied having any fever or chills.  She has no nausea, vomiting, diarrhea or constipation.  She has no headache or visual changes.  She is here today for evaluation and repeat blood work.   MEDICAL HISTORY: Past Medical History:  Diagnosis Date  . Allergy   . Anemia   . Anemia of chronic disease 09/21/2016  . Anxiety   . Arthritis    arthritis- left hip  . Blood transfusion without reported diagnosis    transfusion- 3-4 yrs ago -found to be anemic on routine lab check  . Breast cancer ([place]) 2002   bilateral- bilateral mastectomies done.  . Clinical  depression 12/02/2000  . CVA (cerebral infarction) 04/30/2013  . Encounter for antineoplastic chemotherapy 10/28/2016  . Family history of brain cancer   . Family history of breast cancer   . Family history of colon cancer 10/06/2009  . Family history of colon cancer   . Family history of prostate cancer   . GERD (gastroesophageal reflux disease)   . Glaucoma   . History of bilateral mastectomy 05/20/2010  . Hypertension   . Lung cancer ([place]) dx'd 12/2010   last Ct scan "no lung cancer" showing 11'16 CT Chest Epic.  . Malignant neoplasm of ascending colon ([place]) 01/29/2016  . Stroke [facility])    2 yrs ago-no residual  . Tubular adenoma of colon 07/2011   colon polyps ans reoccurence with malignancy found    ALLERGIES:  is allergic to doxycycline and sulfa antibiotics.  MEDICATIONS:  Current Outpatient Medications  Medication Sig Dispense Refill  . alendronate (FOSAMAX) 70 MG tablet Take 70 mg once a week by mouth.    Marland Kitchen amLODipine (NORVASC) 5 MG tablet Take 1 tablet by mouth as directed. take one tablet in am and 1/2 tablet in pm-    . brimonidine-timolol (COMBIGAN) 0.2-0.5 % ophthalmic solution Place 1 drop into the right eye every 12 (twelve) hours.    . Cholecalciferol (VITAMIN D3) 2000 units capsule Take 2,000 Units daily by mouth.    . clopidogrel (PLAVIX) 75 MG tablet Take 75 mg by mouth daily.    . cycloSPORINE (RESTASIS) 0.05 % ophthalmic emulsion Place 1 drop into both  eyes 2 (two) times daily.    Marland Kitchen escitalopram (LEXAPRO) 10 MG tablet Take 10 mg by mouth daily.     . famotidine (PEPCID) 40 MG tablet Take 40 mg by mouth daily.    . feeding supplement, ENSURE ENLIVE, (ENSURE ENLIVE) LIQD Take 237 mLs by mouth 2 (two) times daily between meals. 237 mL 12  . FeFum-FePoly-FA-B Cmp-C-Biot (FOLIVANE-PLUS) CAPS TAKE 1 CAPSULE BY MOUTH EVERY DAY IN THE MORNING 30 capsule 2  . letrozole (FEMARA) 2.5 MG tablet TAKE 1 TABLET BY MOUTH EVERY DAY 90 tablet 1  . methimazole (TAPAZOLE) 10 MG tablet      . Multiple Vitamins-Iron (MULTIVITAMINS WITH IRON) TABS tablet Take 1 tablet by mouth daily.  0  . osimertinib mesylate (TAGRISSO) 80 MG tablet Take 1 tablet (80 mg total) by mouth daily. 30 tablet 3  . pantoprazole (PROTONIX) 40 MG tablet Take by mouth.     Current Facility-Administered Medications  Medication Dose Route Frequency Provider Last Rate Last Admin  . 0.9 %  sodium chloride infusion  500 mL Intravenous Continuous Armbruster, Carlota Raspberry, MD        SURGICAL HISTORY:  Past Surgical History:  Procedure Laterality Date  . APPENDECTOMY    . BOWEL DECOMPRESSION N/A 06/12/2018   Procedure: BOWEL DECOMPRESSION;  Surgeon: Rush Landmark Telford Nab., MD;  Location: [facility];  Service: Gastroenterology;  Laterality: N/A;  . cataracts Bilateral   . COLECTOMY WITH COLOSTOMY CREATION/HARTMANN PROCEDURE  06/16/2018   Procedure: CREATION/HARTMANN COLOSTOMY;  Surgeon: Michael Boston, MD;  Location: [facility];  Service: General;;  . COLONOSCOPY    . COLOSTOMY N/A 06/16/2018   Procedure: POSSIBLE COLOSTOMY;  Surgeon: Michael Boston, MD;  Location: [facility];  Service: General;  Laterality: N/A;  . EYE SURGERY    . FLEXIBLE SIGMOIDOSCOPY N/A 06/12/2018   Procedure: FLEXIBLE SIGMOIDOSCOPY;  Surgeon: Rush Landmark Telford Nab., MD;  Location: [facility];  Service: Gastroenterology;  Laterality: N/A;  . LAPAROSCOPIC SIGMOID COLECTOMY N/A 06/16/2018   Procedure: LAPAROSCOPIC LOW ANTERIOR RESECTION;  Surgeon: Michael Boston, MD;  Location: [facility];  Service: General;  Laterality: N/A;  . LUNG SURGERY     Resection -" not done. Pt . tx with Tarceva  . MASTECTOMY Bilateral   . MEDIASTINOSCOPY N/A 10/02/2016   Procedure: MEDIASTINOSCOPY;  Surgeon: [name], MD;  Location: [facility] OR;  Service: Thoracic;  Laterality: N/A;  . RECONSTRUCTION BREAST W/ TRAM FLAP Bilateral    bilaterally  . robotic right hemicolectomy Right 03/06/2016   Dr. Johney Maine  . TEE WITHOUT CARDIOVERSION N/A 06/04/2013   Procedure:  TRANSESOPHAGEAL ECHOCARDIOGRAM (TEE);  Surgeon: Jolaine Artist, MD;  Location: [facility] ENDOSCOPY;  Service: Cardiovascular;  Laterality: N/A;  . TONSILLECTOMY    . TOTAL HIP ARTHROPLASTY Left 08/07/2016   Procedure: LEFT TOTAL HIP ARTHROPLASTY ANTERIOR APPROACH;  Surgeon: Gaynelle Arabian, MD;  [facility]: [facility];  Service: Orthopedics;  Laterality: Left;    REVIEW OF SYSTEMS:  A comprehensive review of systems was negative except for: Constitutional: positive for fatigue Musculoskeletal: positive for arthralgias   PHYSICAL EXAMINATION: General appearance: alert, cooperative, fatigued and no distress Head: Normocephalic, without obvious abnormality, atraumatic Neck: no adenopathy, no JVD, supple, symmetrical, trachea midline and thyroid not enlarged, symmetric, no tenderness/mass/nodules Lymph nodes: Cervical, supraclavicular, and axillary nodes normal. Resp: clear to auscultation bilaterally Back: symmetric, no curvature. ROM normal. No CVA tenderness. Cardio: regular rate and rhythm, S1, S2 normal, no murmur, click, rub or gallop GI: soft, non-tender; bowel sounds normal; no masses,  no organomegaly Extremities: extremities normal, atraumatic, no cyanosis or edema  ECOG PERFORMANCE STATUS: 1 - Symptomatic but completely ambulatory  Blood pressure (!) 163/54, pulse 63, temperature 98 F (36.7 C), temperature source Temporal, resp. rate 17, height 5' 6"  (1.676 m), weight 135 lb 4.8 oz (61.4 kg), SpO2 100 %.  LABORATORY DATA: Lab Results  Component Value Date   WBC 5.3 10/25/2019   HGB 11.2 (L) 10/25/2019   HCT 35.0 (L) 10/25/2019   MCV 93.1 10/25/2019   PLT 244 10/25/2019      Chemistry      Component Value Date/Time   NA 142 08/16/2019 1014   NA 138 09/26/2017 0942   K 4.4 08/16/2019 1014   K 5.2 No visable hemolysis (H) 09/26/2017 0942   CL 107 08/16/2019 1014   CL 107 04/05/2013 0754   CO2 24 08/16/2019 1014   CO2 26 09/26/2017 0942   BUN 16 08/16/2019 1014   BUN 16.0  09/26/2017 0942   CREATININE 1.23 (H) 08/16/2019 1014   CREATININE 1.2 (H) 09/26/2017 0942      Component Value Date/Time   CALCIUM 10.5 (H) 08/16/2019 1014   CALCIUM 9.9 09/26/2017 0942   ALKPHOS 85 08/16/2019 1014   ALKPHOS 68 09/26/2017 0942   AST 21 08/16/2019 1014   AST 18 09/26/2017 0942   ALT 17 08/16/2019 1014   ALT 13 09/26/2017 0942   BILITOT 0.5 08/16/2019 1014   BILITOT 0.31 09/26/2017 0942       RADIOGRAPHIC STUDIES: No results found.  ASSESSMENT AND PLAN:  This is a very pleasant 79 years old white female with metastatic non-small cell lung cancer, adenocarcinoma diagnosed in March 2012 with positive EGFR mutation status post 68 months of treatment with Tarceva discontinued secondary to disease progression and development of EGFR T790M resistant mutation. The patient is currently on treatment with Tagrisso 80 mg by mouth daily status post 24 months.  The patient has been tolerating this treatment well. I recommended for her to continue her current treatment with Tagrisso with the same dose. For the hypertension I gave the patient a dose of clonidine 0.2 mg p.o. x1 in the clinic and she was advised to resume her blood pressure medication and to discuss with her primary care physician for adjustment of medication if needed. For the anemia, I recommended for the patient to continue on oral iron tablets and I gave her prescription for Integra +1 capsule p.o. daily. For the history of breast cancer, she will continue on Femara. For the history of colon cancer, she has no evidence for disease recurrence, she will continue on observation for now. I will see her back for follow-up visit in 1 months for evaluation after repeating CT scan of the chest, abdomen and pelvis for restaging of her disease. The patient was advised to call immediately if she has any concerning symptoms in the interval. The patient voices understanding of current disease status and treatment options and  is in agreement with the current care plan. All questions were answered. The patient knows to call the clinic with any problems, questions or concerns. We can certainly see the patient much sooner if necessary.  Disclaimer: This note was dictated with voice recognition software. Similar sounding words can inadvertently be transcribed and may not be corrected upon review.            [place] Telephone:(336) [phone]   Fax:(336) [phone]  OFFICE PROGRESS NOTE  Eber Hong, [place]  [place] [address]  DIAGNOSIS:  1) metastatic non-small cell lung cancer, adenocarcinoma diagnosed in March 2012 with positive EGFR mutation in exon 21 (L858R). 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 2) adenocarcinoma of the ascending colon (T2, N0, M0) with MSI high diagnosed in March 2017. 3) history of breast adenocarcinoma.  PRIOR THERAPY:  1) Tarceva 150 mg by mouth daily status post 68 months of treatment. 2) Status post right colectomy with lymph node dissection in May 2017.  CURRENT THERAPY: 1) Tagrisso 80 mg by mouth daily started 11/09/2016.  Status post 33 months of treatment. 2) observation for the history of colon adenocarcinoma. 3) Femara 2.5 mg by mouth daily for history of breast cancer.  INTERVAL HISTORY: Kristin [name] 79 y.o. female returns to the clinic today for follow-up visit accompanied by her daughter [name].  The patient is feeling fine today with no concerning complaints except for mild fatigue.  She denied having any current chest pain, but has shortness of breath with exertion with no cough or hemoptysis.  She denied having any fever or chills.  She denied having any nausea, vomiting, diarrhea or constipation.  She denied having any skin rash.  She continues to tolerate her treatment with Tagrisso fairly well.  The patient is here today for evaluation and repeat blood work.   MEDICAL HISTORY: Past Medical  History:  Diagnosis Date  . Allergy   . Anemia   . Anemia of chronic disease 09/21/2016  . Anxiety   . Arthritis    arthritis- left hip  . Blood transfusion without reported diagnosis    transfusion- 3-4 yrs ago -found to be anemic on routine lab check  . Breast cancer ([place]) 2002   bilateral- bilateral mastectomies done.  . 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 CVA (cerebral infarction) 04/30/2013  . Encounter for antineoplastic chemotherapy 10/28/2016  . Family history of brain cancer   . Family history of breast cancer   . Family history of colon cancer 10/06/2009  . Family history of colon cancer   . Family history of prostate cancer   . GERD (gastroesophageal reflux disease)   . Glaucoma   . History of bilateral mastectomy 05/20/2010  . Hypertension   . Lung cancer ([place]) dx'd 12/2010   last Ct scan "no lung cancer" showing 11'16 CT Chest Epic.  . Malignant neoplasm of ascending colon ([place]) 01/29/2016  . Stroke [facility])    2 yrs ago-no residual  . Tubular adenoma of colon 07/2011   colon polyps ans reoccurence with malignancy found    ALLERGIES:  is allergic to doxycycline and sulfa antibiotics.  MEDICATIONS:  Current Outpatient Medications  Medication Sig Dispense Refill  . alendronate (FOSAMAX) 70 MG tablet Take 70 mg once a week by mouth.    Marland Kitchen amLODipine (NORVASC) 5 MG tablet Take 1 tablet by mouth as directed. take one tablet in am and 1/2 tablet in pm-    . brimonidine-timolol (COMBIGAN) 0.2-0.5 % ophthalmic solution Place 1 drop into the right eye every 12 (twelve) hours.    . Cholecalciferol (VITAMIN D3) 2000 units capsule Take 2,000 Units daily by mouth.    . clopidogrel (PLAVIX) 75 MG tablet Take 75 mg by mouth daily.    . cycloSPORINE (RESTASIS) 0.05 % ophthalmic emulsion Place 1 drop into both eyes 2 (two) times daily.    Marland Kitchen escitalopram (LEXAPRO) 10 MG tablet Take 10 mg by mouth daily.     . famotidine (PEPCID) 40 MG tablet Take 40 mg  by mouth daily.    . feeding  supplement, ENSURE ENLIVE, (ENSURE ENLIVE) LIQD Take 237 mLs by mouth 2 (two) times daily between meals. 237 mL 12  . FeFum-FePoly-FA-B Cmp-C-Biot (FOLIVANE-PLUS) CAPS TAKE 1 CAPSULE BY MOUTH EVERY DAY IN THE MORNING 30 capsule 2  . letrozole (FEMARA) 2.5 MG tablet TAKE 1 TABLET BY MOUTH EVERY DAY 90 tablet 1  . methimazole (TAPAZOLE) 10 MG tablet     . Multiple Vitamins-Iron (MULTIVITAMINS WITH IRON) TABS tablet Take 1 tablet by mouth daily.  0  . osimertinib mesylate (TAGRISSO) 80 MG tablet Take 1 tablet (80 mg total) by mouth daily. 30 tablet 3  . pantoprazole (PROTONIX) 40 MG tablet Take by mouth.     Current Facility-Administered Medications  Medication Dose Route Frequency Provider Last Rate Last Admin  . 0.9 %  sodium chloride infusion  500 mL Intravenous Continuous Armbruster, Carlota Raspberry, MD        SURGICAL HISTORY:  Past Surgical History:  Procedure Laterality Date  . APPENDECTOMY    . BOWEL DECOMPRESSION N/A 06/12/2018   Procedure: BOWEL DECOMPRESSION;  Surgeon: Rush Landmark Telford Nab., MD;  Location: [facility];  Service: Gastroenterology;  Laterality: N/A;  . cataracts Bilateral   . COLECTOMY WITH COLOSTOMY CREATION/HARTMANN PROCEDURE  06/16/2018   Procedure: CREATION/HARTMANN COLOSTOMY;  Surgeon: Michael Boston, MD;  [facility]: [facility];  Service: General;;  . COLONOSCOPY    . COLOSTOMY N/A 06/16/2018   Procedure: POSSIBLE COLOSTOMY;  Surgeon: Michael Boston, MD;  [facility]: [facility];  Service: General;  Laterality: N/A;  . EYE SURGERY    . FLEXIBLE SIGMOIDOSCOPY N/A 06/12/2018   Procedure: FLEXIBLE SIGMOIDOSCOPY;  Surgeon: Rush Landmark Telford Nab., MD;  Location: [facility];  Service: Gastroenterology;  Laterality: N/A;  . LAPAROSCOPIC SIGMOID COLECTOMY N/A 06/16/2018   Procedure: LAPAROSCOPIC LOW ANTERIOR RESECTION;  Surgeon: Michael Boston, MD;  [facility]: [facility];  Service: General;  Laterality: N/A;  . LUNG SURGERY     Resection -" not done. Pt . tx with Tarceva  . MASTECTOMY  Bilateral   . MEDIASTINOSCOPY N/A 10/02/2016   Procedure: MEDIASTINOSCOPY;  Surgeon: [name], MD;  Location: [facility] OR;  Service: Thoracic;  Laterality: N/A;  . RECONSTRUCTION BREAST W/ TRAM FLAP Bilateral    bilaterally  . robotic right hemicolectomy Right 03/06/2016   Dr. Johney Maine  . TEE WITHOUT CARDIOVERSION N/A 06/04/2013   Procedure: TRANSESOPHAGEAL ECHOCARDIOGRAM (TEE);  Surgeon: Jolaine Artist, MD;  Location: [facility] ENDOSCOPY;  Service: Cardiovascular;  Laterality: N/A;  . TONSILLECTOMY    . TOTAL HIP ARTHROPLASTY Left 08/07/2016   Procedure: LEFT TOTAL HIP ARTHROPLASTY ANTERIOR APPROACH;  Surgeon: Gaynelle Arabian, MD;  [facility];  Service: Orthopedics;  Laterality: Left;    REVIEW OF SYSTEMS:  A comprehensive review of systems was negative except for: Constitutional: positive for fatigue Respiratory: positive for dyspnea on exertion   PHYSICAL EXAMINATION: General appearance: alert, cooperative, fatigued and no distress Head: Normocephalic, without obvious abnormality, atraumatic Neck: no adenopathy, no JVD, supple, symmetrical, trachea midline and thyroid not enlarged, symmetric, no tenderness/mass/nodules Lymph nodes: Cervical, supraclavicular, and axillary nodes normal. Resp: clear to auscultation bilaterally Back: symmetric, no curvature. ROM normal. No CVA tenderness. Cardio: regular rate and rhythm, S1, S2 normal, no murmur, click, rub or gallop GI: soft, non-tender; bowel sounds normal; no masses,  no organomegaly Extremities: extremities normal, atraumatic, no cyanosis or edema  ECOG PERFORMANCE STATUS: 1 - Symptomatic but completely ambulatory  Blood pressure (!) 163/54, pulse 63, temperature 98 F (36.7  C), temperature source Temporal, resp. rate 17, height 5' 6"  (1.676 m), weight 135 lb 4.8 oz (61.4 kg), SpO2 100 %.  LABORATORY DATA: Lab Results  Component Value Date   WBC 5.3 10/25/2019   HGB 11.2 (L) 10/25/2019   HCT 35.0 (L) 10/25/2019   MCV 93.1  10/25/2019   PLT 244 10/25/2019      Chemistry      Component Value Date/Time   NA 142 08/16/2019 1014   NA 138 09/26/2017 0942   K 4.4 08/16/2019 1014   K 5.2 No visable hemolysis (H) 09/26/2017 0942   CL 107 08/16/2019 1014   CL 107 04/05/2013 0754   CO2 24 08/16/2019 1014   CO2 26 09/26/2017 0942   BUN 16 08/16/2019 1014   BUN 16.0 09/26/2017 0942   CREATININE 1.23 (H) 08/16/2019 1014   CREATININE 1.2 (H) 09/26/2017 0942      Component Value Date/Time   CALCIUM 10.5 (H) 08/16/2019 1014   CALCIUM 9.9 09/26/2017 0942   ALKPHOS 85 08/16/2019 1014   ALKPHOS 68 09/26/2017 0942   AST 21 08/16/2019 1014   AST 18 09/26/2017 0942   ALT 17 08/16/2019 1014   ALT 13 09/26/2017 0942   BILITOT 0.5 08/16/2019 1014   BILITOT 0.31 09/26/2017 0942       RADIOGRAPHIC STUDIES: No results found.  ASSESSMENT AND PLAN:  This is a very pleasant 79 years old white female with metastatic non-small cell lung cancer, adenocarcinoma diagnosed in March 2012 with positive EGFR mutation status post 68 months of treatment with Tarceva discontinued secondary to disease progression and development of EGFR T790M resistant mutation. The patient is currently on treatment with Tagrisso 80 mg by mouth daily status post 35 months.  The patient has been tolerating her treatment with Tagrisso fairly well. I recommended for her to continue her current treatment with the same dose. I will see her back for follow-up visit in 2 months for evaluation with repeat CT scan of the chest, abdomen pelvis for restaging of her disease. For the anemia, I recommended for the patient to continue on oral iron tablets and I gave her prescription for Integra +1 capsule p.o. daily. For the history of breast cancer, she will continue on Femara. For the history of colon cancer, she has no evidence for disease recurrence, she will continue on observation for now. For the hypertension, she will continue to monitor her blood pressure  closely at home and to discuss with her primary care physician for adjustment of her medication if needed. The patient was advised to call immediately if she has any concerning symptoms in the interval. The patient voices understanding of current disease status and treatment options and is in agreement with the current care plan. All questions were answered. The patient knows to call the clinic with any problems, questions or concerns. We can certainly see the patient much sooner if necessary.  Disclaimer: This note was dictated with voice recognition software. Similar sounding words can inadvertently be transcribed and may not be corrected upon review.

## 2019-10-25 NOTE — Telephone Encounter (Signed)
Scheduled appt per 1/4 los - gave patient AVS and calender per los.   

## 2019-11-11 ENCOUNTER — Telehealth: Payer: Self-pay | Admitting: Medical Oncology

## 2019-11-11 NOTE — Telephone Encounter (Signed)
Per Dr Julien Nordmann I told her she can get the COVID vaccine .

## 2019-12-18 ENCOUNTER — Other Ambulatory Visit: Payer: Self-pay | Admitting: Nurse Practitioner

## 2019-12-19 ENCOUNTER — Other Ambulatory Visit: Payer: Self-pay | Admitting: Internal Medicine

## 2019-12-24 ENCOUNTER — Ambulatory Visit (HOSPITAL_COMMUNITY)
Admission: RE | Admit: 2019-12-24 | Discharge: 2019-12-24 | Disposition: A | Discharge status: Home / Self Care | Payer: Medicare Other | Source: Ambulatory Visit | Attending: Internal Medicine | Admitting: Internal Medicine

## 2019-12-24 ENCOUNTER — Inpatient Hospital Stay: Payer: Medicare Other | Attending: Internal Medicine

## 2019-12-24 ENCOUNTER — Other Ambulatory Visit: Payer: Self-pay

## 2019-12-24 DIAGNOSIS — E042 Nontoxic multinodular goiter: Secondary | ICD-10-CM | POA: Insufficient documentation

## 2019-12-24 DIAGNOSIS — Z85038 Personal history of other malignant neoplasm of large intestine: Secondary | ICD-10-CM | POA: Diagnosis not present

## 2019-12-24 DIAGNOSIS — C3490 Malignant neoplasm of unspecified part of unspecified bronchus or lung: Secondary | ICD-10-CM

## 2019-12-24 DIAGNOSIS — Z79811 Long term (current) use of aromatase inhibitors: Secondary | ICD-10-CM | POA: Diagnosis not present

## 2019-12-24 DIAGNOSIS — Z803 Family history of malignant neoplasm of breast: Secondary | ICD-10-CM | POA: Insufficient documentation

## 2019-12-24 DIAGNOSIS — C50919 Malignant neoplasm of unspecified site of unspecified female breast: Secondary | ICD-10-CM | POA: Diagnosis not present

## 2019-12-24 DIAGNOSIS — Z8673 Personal history of transient ischemic attack (TIA), and cerebral infarction without residual deficits: Secondary | ICD-10-CM | POA: Insufficient documentation

## 2019-12-24 DIAGNOSIS — Z85118 Personal history of other malignant neoplasm of bronchus and lung: Secondary | ICD-10-CM | POA: Insufficient documentation

## 2019-12-24 DIAGNOSIS — C3412 Malignant neoplasm of upper lobe, left bronchus or lung: Secondary | ICD-10-CM | POA: Diagnosis present

## 2019-12-24 DIAGNOSIS — F419 Anxiety disorder, unspecified: Secondary | ICD-10-CM | POA: Diagnosis not present

## 2019-12-24 DIAGNOSIS — D638 Anemia in other chronic diseases classified elsewhere: Secondary | ICD-10-CM | POA: Insufficient documentation

## 2019-12-24 DIAGNOSIS — Z9013 Acquired absence of bilateral breasts and nipples: Secondary | ICD-10-CM | POA: Insufficient documentation

## 2019-12-24 DIAGNOSIS — Z8042 Family history of malignant neoplasm of prostate: Secondary | ICD-10-CM | POA: Diagnosis not present

## 2019-12-24 DIAGNOSIS — Z79899 Other long term (current) drug therapy: Secondary | ICD-10-CM | POA: Diagnosis not present

## 2019-12-24 DIAGNOSIS — Z933 Colostomy status: Secondary | ICD-10-CM | POA: Diagnosis not present

## 2019-12-24 DIAGNOSIS — Z170 Estrogen receptor positive status [ER+]: Secondary | ICD-10-CM | POA: Diagnosis not present

## 2019-12-24 DIAGNOSIS — Z800 Family history of malignant neoplasm of digestive organs: Secondary | ICD-10-CM | POA: Insufficient documentation

## 2019-12-24 LAB — CBC WITH DIFFERENTIAL (CANCER CENTER ONLY)
Abs Immature Granulocytes: 0.02 10*3/uL (ref 0.00–0.07)
Basophils Absolute: 0 10*3/uL (ref 0.0–0.1)
Basophils Relative: 1 %
Eosinophils Absolute: 0.1 10*3/uL (ref 0.0–0.5)
Eosinophils Relative: 2 %
HCT: 35.6 % — ABNORMAL LOW (ref 36.0–46.0)
Hemoglobin: 11.3 g/dL — ABNORMAL LOW (ref 12.0–15.0)
Immature Granulocytes: 0 %
Lymphocytes Relative: 16 %
Lymphs Abs: 0.9 10*3/uL (ref 0.7–4.0)
MCH: 29.2 pg (ref 26.0–34.0)
MCHC: 31.7 g/dL (ref 30.0–36.0)
MCV: 92 fL (ref 80.0–100.0)
Monocytes Absolute: 0.9 10*3/uL (ref 0.1–1.0)
Monocytes Relative: 16 %
Neutro Abs: 3.7 10*3/uL (ref 1.7–7.7)
Neutrophils Relative %: 65 %
Platelet Count: 268 10*3/uL (ref 150–400)
RBC: 3.87 MIL/uL (ref 3.87–5.11)
RDW: 13.8 % (ref 11.5–15.5)
WBC Count: 5.7 10*3/uL (ref 4.0–10.5)
nRBC: 0 % (ref 0.0–0.2)

## 2019-12-24 LAB — CMP (CANCER CENTER ONLY)
ALT: 14 U/L (ref 0–44)
AST: 20 U/L (ref 15–41)
Albumin: 3.7 g/dL (ref 3.5–5.0)
Alkaline Phosphatase: 102 U/L (ref 38–126)
Anion gap: 7 (ref 5–15)
BUN: 21 mg/dL (ref 8–23)
CO2: 27 mmol/L (ref 22–32)
Calcium: 9.8 mg/dL (ref 8.9–10.3)
Chloride: 106 mmol/L (ref 98–111)
Creatinine: 1.22 mg/dL — ABNORMAL HIGH (ref 0.44–1.00)
GFR, Est AFR Am: 48 mL/min — ABNORMAL LOW (ref >60)
GFR, Estimated: 42 mL/min — ABNORMAL LOW (ref >60)
Glucose, Bld: 96 mg/dL (ref 70–99)
Potassium: 4.4 mmol/L (ref 3.5–5.1)
Sodium: 140 mmol/L (ref 135–145)
Total Bilirubin: 0.3 mg/dL (ref 0.3–1.2)
Total Protein: 6.9 g/dL (ref 6.5–8.1)

## 2019-12-24 MED ORDER — SODIUM CHLORIDE (PF) 0.9 % IJ SOLN
INTRAMUSCULAR | Status: AC
  Filled 2019-12-24: qty 50

## 2019-12-24 MED ORDER — IOHEXOL 300 MG/ML  SOLN
75.0000 mL | Freq: Once | INTRAMUSCULAR | Status: AC | PRN
  Administered 2019-12-24: 75 mL via INTRAVENOUS

## 2019-12-27 ENCOUNTER — Other Ambulatory Visit: Payer: Self-pay

## 2019-12-27 ENCOUNTER — Encounter: Payer: Self-pay | Admitting: Internal Medicine

## 2019-12-27 ENCOUNTER — Inpatient Hospital Stay (HOSPITAL_BASED_OUTPATIENT_CLINIC_OR_DEPARTMENT_OTHER): Payer: Medicare Other | Admitting: Internal Medicine

## 2019-12-27 VITALS — BP 155/63 | HR 68 | Temp 98.3°F | Resp 18 | Ht 66.0 in | Wt 133.7 lb

## 2019-12-27 DIAGNOSIS — C3492 Malignant neoplasm of unspecified part of left bronchus or lung: Secondary | ICD-10-CM | POA: Diagnosis not present

## 2019-12-27 DIAGNOSIS — C50919 Malignant neoplasm of unspecified site of unspecified female breast: Secondary | ICD-10-CM | POA: Diagnosis not present

## 2019-12-27 DIAGNOSIS — C3412 Malignant neoplasm of upper lobe, left bronchus or lung: Secondary | ICD-10-CM

## 2019-12-27 DIAGNOSIS — C50112 Malignant neoplasm of central portion of left female breast: Secondary | ICD-10-CM

## 2019-12-27 DIAGNOSIS — D638 Anemia in other chronic diseases classified elsewhere: Secondary | ICD-10-CM

## 2019-12-27 DIAGNOSIS — Z170 Estrogen receptor positive status [ER+]: Secondary | ICD-10-CM

## 2019-12-27 DIAGNOSIS — C50111 Malignant neoplasm of central portion of right female breast: Secondary | ICD-10-CM

## 2019-12-27 DIAGNOSIS — C182 Malignant neoplasm of ascending colon: Secondary | ICD-10-CM | POA: Diagnosis not present

## 2019-12-27 NOTE — Progress Notes (Signed)
[facility] Telephone:([phone]) [phone]   Fax:([phone]) [phone]  OFFICE PROGRESS NOTE  Eber Hong, MD [address] [place] [address]  DIAGNOSIS:  1) Metastatic non-small cell lung cancer, adenocarcinoma diagnosed in March 2012 with positive EGFR mutation in exon 21 ([id number]). 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 2) adenocarcinoma of the ascending colon (T2, N0, M0) with MSI high diagnosed in March 2017. 3) history of breast adenocarcinoma.  PRIOR THERAPY:  1) Tarceva 150 mg by mouth daily status post 68 months of treatment. 2) Status post right colectomy with lymph node dissection in May 2017.  CURRENT THERAPY: 1) Tagrisso 80 mg by mouth daily started 11/09/2016.  Status post 24 months of treatment. 2) observation for the history of colon adenocarcinoma. 3) Femara 2.5 mg by mouth daily for history of breast cancer.  INTERVAL HISTORY: Kristin [name] 79 y.o. female returns to the clinic today for follow-up visit accompanied by her daughter [name].  The patient is feeling fine today with no concerning complaints except for fatigue and shortness of breath with exertion.  She denied having any current chest pain, cough or hemoptysis.  She denied having any fever or chills.  She has no nausea, vomiting, diarrhea or constipation.  She denied having any headache or visual changes.  She continues to tolerate her treatment with Tagrisso fairly well.  The patient is here today for evaluation with repeat CT scan of the chest, abdomen pelvis for restaging of her disease.   MEDICAL HISTORY: Past Medical History:  Diagnosis Date  . Allergy   . Anemia   . Anemia of chronic disease 09/21/2016  . Anxiety   . Arthritis    arthritis- left hip  . Blood transfusion without reported diagnosis    transfusion- 3-4 yrs ago -found to be anemic on routine lab check  . Breast cancer ([place]) 2002   bilateral- bilateral mastectomies done.  . Clinical  depression 12/02/2000  . CVA (cerebral infarction) 04/30/2013  . Encounter for antineoplastic chemotherapy 10/28/2016  . Family history of brain cancer   . Family history of breast cancer   . Family history of colon cancer 10/06/2009  . Family history of colon cancer   . Family history of prostate cancer   . GERD (gastroesophageal reflux disease)   . Glaucoma   . History of bilateral mastectomy 05/20/2010  . Hypertension   . Lung cancer ([place]) dx'd 12/2010   last Ct scan "no lung cancer" showing 11'16 CT Chest Epic.  . Malignant neoplasm of ascending colon ([place]) 01/29/2016  . Stroke [facility])    2 yrs ago-no residual  . Tubular adenoma of colon 07/2011   colon polyps ans reoccurence with malignancy found    ALLERGIES:  is allergic to doxycycline and sulfa antibiotics.  MEDICATIONS:  Current Outpatient Medications  Medication Sig Dispense Refill  . amLODipine (NORVASC) 5 MG tablet Take 1 tablet by mouth as directed. take one tablet in am and 1/2 tablet in pm-    . brimonidine-timolol (COMBIGAN) 0.2-0.5 % ophthalmic solution Place 1 drop into the right eye every 12 (twelve) hours.    . Cholecalciferol (VITAMIN D3) 2000 units capsule Take 2,000 Units daily by mouth.    . clopidogrel (PLAVIX) 75 MG tablet Take 75 mg by mouth daily.    . cycloSPORINE (RESTASIS) 0.05 % ophthalmic emulsion Place 1 drop into both eyes 2 (two) times daily.    Marland Kitchen escitalopram (LEXAPRO) 10 MG tablet Take 10 mg  by mouth daily.     . famotidine (PEPCID) 40 MG tablet Take 40 mg by mouth daily.    . feeding supplement, ENSURE ENLIVE, (ENSURE ENLIVE) LIQD Take 237 mLs by mouth 2 (two) times daily between meals. 237 mL 12  . FeFum-FePoly-FA-B Cmp-C-Biot (FOLIVANE-PLUS) CAPS TAKE 1 CAPSULE BY MOUTH EVERY DAY IN THE MORNING 90 capsule 2  . letrozole (FEMARA) 2.5 MG tablet TAKE 1 TABLET BY MOUTH EVERY DAY 90 tablet 1  . Multiple Vitamins-Iron (MULTIVITAMINS WITH IRON) TABS tablet Take 1 tablet by mouth daily.  0  . osimertinib  mesylate (TAGRISSO) 80 MG tablet Take 1 tablet (80 mg total) by mouth daily. 30 tablet 3   Current Facility-Administered Medications  Medication Dose Route Frequency Provider Last Rate Last Admin  . 0.9 %  sodium chloride infusion  500 mL Intravenous Continuous Armbruster, Carlota Raspberry, MD        SURGICAL HISTORY:  Past Surgical History:  Procedure Laterality Date  . APPENDECTOMY    . BOWEL DECOMPRESSION N/A 06/12/2018   Procedure: BOWEL DECOMPRESSION;  Surgeon: Rush Landmark Telford Nab., MD;  Location: [facility];  Service: Gastroenterology;  Laterality: N/A;  . cataracts Bilateral   . COLECTOMY WITH COLOSTOMY CREATION/HARTMANN PROCEDURE  06/16/2018   Procedure: CREATION/HARTMANN COLOSTOMY;  Surgeon: Michael Boston, MD;  Location: [facility];  Service: General;;  . COLONOSCOPY    . COLOSTOMY N/A 06/16/2018   Procedure: POSSIBLE COLOSTOMY;  Surgeon: Michael Boston, MD;  Location: [facility];  Service: General;  Laterality: N/A;  . EYE SURGERY    . FLEXIBLE SIGMOIDOSCOPY N/A 06/12/2018   Procedure: FLEXIBLE SIGMOIDOSCOPY;  Surgeon: Rush Landmark Telford Nab., MD;  Location: [facility];  Service: Gastroenterology;  Laterality: N/A;  . LAPAROSCOPIC SIGMOID COLECTOMY N/A 06/16/2018   Procedure: LAPAROSCOPIC LOW ANTERIOR RESECTION;  Surgeon: Michael Boston, MD;  Location: [facility];  Service: General;  Laterality: N/A;  . LUNG SURGERY     Resection -" not done. Pt . tx with Tarceva  . MASTECTOMY Bilateral   . MEDIASTINOSCOPY N/A 10/02/2016   Procedure: MEDIASTINOSCOPY;  Surgeon: [name], MD;  Location: [facility];  Service: Thoracic;  Laterality: N/A;  . RECONSTRUCTION BREAST W/ TRAM FLAP Bilateral    bilaterally  . robotic right hemicolectomy Right 03/06/2016   Dr. Johney Maine  . TEE WITHOUT CARDIOVERSION N/A 06/04/2013   Procedure: TRANSESOPHAGEAL ECHOCARDIOGRAM (TEE);  Surgeon: Jolaine Artist, MD;  Location: [facility];  Service: Cardiovascular;  Laterality: N/A;  . TONSILLECTOMY    . TOTAL HIP  ARTHROPLASTY Left 08/07/2016   Procedure: LEFT TOTAL HIP ARTHROPLASTY ANTERIOR APPROACH;  Surgeon: Gaynelle Arabian, MD;  Location: [facility];  Service: Orthopedics;  Laterality: Left;    REVIEW OF SYSTEMS:  Constitutional: positive for fatigue Eyes: negative Ears, nose, mouth, throat, and face: negative Respiratory: positive for dyspnea on exertion Cardiovascular: negative Gastrointestinal: negative Genitourinary:negative Integument/breast: negative Hematologic/lymphatic: negative Musculoskeletal:negative Neurological: negative Behavioral/Psych: negative Endocrine: negative Allergic/Immunologic: negative   PHYSICAL EXAMINATION: General appearance: alert, cooperative, fatigued and no distress Head: Normocephalic, without obvious abnormality, atraumatic Neck: no adenopathy, no JVD, supple, symmetrical, trachea midline and thyroid not enlarged, symmetric, no tenderness/mass/nodules Lymph nodes: Cervical, supraclavicular, and axillary nodes normal. Resp: clear to auscultation bilaterally Back: symmetric, no curvature. ROM normal. No CVA tenderness. Cardio: regular rate and rhythm, S1, S2 normal, no murmur, click, rub or gallop GI: soft, non-tender; bowel sounds normal; no masses,  no organomegaly Extremities: extremities normal, atraumatic, no cyanosis or edema Neurologic: Alert and oriented X 3, normal strength and  tone. Normal symmetric reflexes. Normal coordination and gait  ECOG PERFORMANCE STATUS: 1 - Symptomatic but completely ambulatory  Blood pressure (!) 155/63, pulse 68, temperature 98.3 F (36.8 C), temperature source Temporal, resp. rate 18, height 5' 6"  (1.676 m), weight 133 lb 11.2 oz (60.6 kg), SpO2 98 %.  LABORATORY DATA: Lab Results  Component Value Date   WBC 5.7 12/24/2019   HGB 11.3 (L) 12/24/2019   HCT 35.6 (L) 12/24/2019   MCV 92.0 12/24/2019   PLT 268 12/24/2019      Chemistry      Component Value Date/Time   NA 140 12/24/2019 1016   NA 138 09/26/2017  0942   K 4.4 12/24/2019 1016   K 5.2 No visable hemolysis (H) 09/26/2017 0942   CL 106 12/24/2019 1016   CL 107 04/05/2013 0754   CO2 27 12/24/2019 1016   CO2 26 09/26/2017 0942   BUN 21 12/24/2019 1016   BUN 16.0 09/26/2017 0942   CREATININE 1.22 (H) 12/24/2019 1016   CREATININE 1.2 (H) 09/26/2017 0942      Component Value Date/Time   CALCIUM 9.8 12/24/2019 1016   CALCIUM 9.9 09/26/2017 0942   ALKPHOS 102 12/24/2019 1016   ALKPHOS 68 09/26/2017 0942   AST 20 12/24/2019 1016   AST 18 09/26/2017 0942   ALT 14 12/24/2019 1016   ALT 13 09/26/2017 0942   BILITOT 0.3 12/24/2019 1016   BILITOT 0.31 09/26/2017 0942       RADIOGRAPHIC STUDIES: CT Chest W Contrast  Result Date: 12/24/2019 CLINICAL DATA:  Lung cancer restaging EXAM: CT CHEST, ABDOMEN, AND PELVIS WITH CONTRAST TECHNIQUE: Multidetector CT imaging of the chest, abdomen and pelvis was performed following the standard protocol during bolus administration of intravenous contrast. CONTRAST:  61m OMNIPAQUE IOHEXOL 300 MG/ML SOLN, additional oral enteric contrast COMPARISON:  08/16/2019 FINDINGS: CT CHEST FINDINGS Cardiovascular: Aortic atherosclerosis. Normal heart size. No pericardial effusion. Mediastinum/Nodes: No enlarged mediastinal, hilar, or axillary lymph nodes. Moderate hiatal hernia. Numerous subcentimeter low-attenuation thyroid nodules. The trachea, and esophagus demonstrate no significant findings. Lungs/Pleura: Status post left upper lobe wedge resection. Small left pleural effusion associated atelectasis or consolidation, increased compared to prior examination. Slight interval increase in pleural thickening or soft tissue nodularity about the medial left upper lobe abutting the anterior mediastinal fat, now measuring up to 1.0 cm in thickness, previously no greater than 5 mm in thickness (series 2, image 26). Musculoskeletal: No chest wall mass or suspicious bone lesions identified. CT ABDOMEN PELVIS FINDINGS  Hepatobiliary: No solid liver abnormality is seen. Fluid attenuation cyst of the central right lobe of the liver. No gallstones, gallbladder wall thickening, or biliary dilatation. Pancreas: Unremarkable. No pancreatic ductal dilatation or surrounding inflammatory changes. Spleen: Normal in size without significant abnormality. Adrenals/Urinary Tract: Adrenal glands are unremarkable. Kidneys are normal, without renal calculi, solid lesion, or hydronephrosis. Bladder is unremarkable. Stomach/Bowel: Stomach is within normal limits. Status post partial right colectomy and reanastomosis. Status post left lower quadrant end colostomy with rectal stump. Vascular/Lymphatic: Aortic atherosclerosis. No enlarged abdominal or pelvic lymph nodes. Reproductive: Calcified uterine fibroids. Other: No abdominal wall hernia or abnormality. No abdominopelvic ascites. Musculoskeletal: No acute or significant osseous findings. IMPRESSION: 1. Redemonstrated postoperative findings status post left upper lobe wedge resection. Slight interval increase in pleural thickening or soft tissue nodularity about the medial left upper lobe abutting the anterior mediastinal fat, now measuring up to 1.0 cm in thickness, previously no greater than 5 mm in thickness. Small left pleural effusion, increased  compared to prior examination. These findings are generally suspicious for malignant recurrence. This suspicious tissue could be characterized for metabolic activity by FDG PET or perhaps sampled percutaneously. 2. No CT evidence of metastatic disease within the abdomen or pelvis. 3. Redemonstrated postoperative findings status post partial right colectomy and reanastomosis. Status post left lower quadrant end colostomy with rectal stump. 4. Moderate hiatal hernia. 5. Aortic Atherosclerosis (ICD10-I70.0). Electronically Signed   By: Eddie Candle M.D.   On: 12/24/2019 14:16   CT Abdomen Pelvis W Contrast  Result Date: 12/24/2019 CLINICAL DATA:  Lung  cancer restaging EXAM: CT CHEST, ABDOMEN, AND PELVIS WITH CONTRAST TECHNIQUE: Multidetector CT imaging of the chest, abdomen and pelvis was performed following the standard protocol during bolus administration of intravenous contrast. CONTRAST:  24m OMNIPAQUE IOHEXOL 300 MG/ML SOLN, additional oral enteric contrast COMPARISON:  08/16/2019 FINDINGS: CT CHEST FINDINGS Cardiovascular: Aortic atherosclerosis. Normal heart size. No pericardial effusion. Mediastinum/Nodes: No enlarged mediastinal, hilar, or axillary lymph nodes. Moderate hiatal hernia. Numerous subcentimeter low-attenuation thyroid nodules. The trachea, and esophagus demonstrate no significant findings. Lungs/Pleura: Status post left upper lobe wedge resection. Small left pleural effusion associated atelectasis or consolidation, increased compared to prior examination. Slight interval increase in pleural thickening or soft tissue nodularity about the medial left upper lobe abutting the anterior mediastinal fat, now measuring up to 1.0 cm in thickness, previously no greater than 5 mm in thickness (series 2, image 26). Musculoskeletal: No chest wall mass or suspicious bone lesions identified. CT ABDOMEN PELVIS FINDINGS Hepatobiliary: No solid liver abnormality is seen. Fluid attenuation cyst of the central right lobe of the liver. No gallstones, gallbladder wall thickening, or biliary dilatation. Pancreas: Unremarkable. No pancreatic ductal dilatation or surrounding inflammatory changes. Spleen: Normal in size without significant abnormality. Adrenals/Urinary Tract: Adrenal glands are unremarkable. Kidneys are normal, without renal calculi, solid lesion, or hydronephrosis. Bladder is unremarkable. Stomach/Bowel: Stomach is within normal limits. Status post partial right colectomy and reanastomosis. Status post left lower quadrant end colostomy with rectal stump. Vascular/Lymphatic: Aortic atherosclerosis. No enlarged abdominal or pelvic lymph nodes.  Reproductive: Calcified uterine fibroids. Other: No abdominal wall hernia or abnormality. No abdominopelvic ascites. Musculoskeletal: No acute or significant osseous findings. IMPRESSION: 1. Redemonstrated postoperative findings status post left upper lobe wedge resection. Slight interval increase in pleural thickening or soft tissue nodularity about the medial left upper lobe abutting the anterior mediastinal fat, now measuring up to 1.0 cm in thickness, previously no greater than 5 mm in thickness. Small left pleural effusion, increased compared to prior examination. These findings are generally suspicious for malignant recurrence. This suspicious tissue could be characterized for metabolic activity by FDG PET or perhaps sampled percutaneously. 2. No CT evidence of metastatic disease within the abdomen or pelvis. 3. Redemonstrated postoperative findings status post partial right colectomy and reanastomosis. Status post left lower quadrant end colostomy with rectal stump. 4. Moderate hiatal hernia. 5. Aortic Atherosclerosis (ICD10-I70.0). Electronically Signed   By: AEddie CandleM.D.   On: 12/24/2019 14:16    ASSESSMENT AND PLAN:  This is a very pleasant 79years old white female with metastatic non-small cell lung cancer, adenocarcinoma diagnosed in March 2012 with positive EGFR mutation status post 68 months of treatment with Tarceva discontinued secondary to disease progression and development of EGFR T790M resistant mutation. The patient is currently on treatment with Tagrisso 80 mg by mouth daily status post 25 months.  The patient continues to tolerate her treatment well with no concerning adverse effects. She had repeat  CT scan of the chest, abdomen pelvis performed recently.  I personally and independently reviewed these can and discussed the results with the patient and her daughter.  Her scan showed no concerning findings for disease progression but there was some thickening in the left lung that  need close monitoring on upcoming imaging studies. I recommended for the patient to continue her current treatment with Tagrisso with the same dose. For the hypertension she was advised to monitor her blood pressure closely at home and to discuss with her primary care physician adjustment of her medications if needed. For the anemia she will continue on the oral iron tablets with Integra +1 capsule p.o. daily. For the history of colon cancer she is currently on observation and no evidence of recurrence. For the history of breast cancer she will continue her current treatment with Femara. The patient will come back for follow-up visit in 6 weeks for evaluation with repeat blood work. She was advised to call immediately if she has any other concerning symptoms in the interval. The patient voices understanding of current disease status and treatment options and is in agreement with the current care plan. All questions were answered. The patient knows to call the clinic with any problems, questions or concerns. We can certainly see the patient much sooner if necessary.  Disclaimer: This note was dictated with voice recognition software. Similar sounding words can inadvertently be transcribed and may not be corrected upon review.

## 2020-01-07 ENCOUNTER — Other Ambulatory Visit: Payer: Self-pay | Admitting: Medical Oncology

## 2020-01-07 DIAGNOSIS — D638 Anemia in other chronic diseases classified elsewhere: Secondary | ICD-10-CM

## 2020-01-07 DIAGNOSIS — C50112 Malignant neoplasm of central portion of left female breast: Secondary | ICD-10-CM

## 2020-01-07 DIAGNOSIS — C182 Malignant neoplasm of ascending colon: Secondary | ICD-10-CM

## 2020-01-07 DIAGNOSIS — C3412 Malignant neoplasm of upper lobe, left bronchus or lung: Secondary | ICD-10-CM

## 2020-01-07 DIAGNOSIS — C50111 Malignant neoplasm of central portion of right female breast: Secondary | ICD-10-CM

## 2020-01-07 DIAGNOSIS — Z5111 Encounter for antineoplastic chemotherapy: Secondary | ICD-10-CM

## 2020-01-07 MED ORDER — OSIMERTINIB MESYLATE 80 MG PO TABS: 80.0000 mg | ORAL_TABLET | Freq: Every day | ORAL | 3 refills | Status: DC

## 2020-01-07 NOTE — Progress Notes (Signed)
Faxed Tagrisso refill to [place] and  Me.

## 2020-02-07 ENCOUNTER — Other Ambulatory Visit: Payer: Self-pay

## 2020-02-07 ENCOUNTER — Inpatient Hospital Stay: Payer: Medicare Other

## 2020-02-07 ENCOUNTER — Encounter: Payer: Self-pay | Admitting: Internal Medicine

## 2020-02-07 ENCOUNTER — Encounter: Payer: Self-pay | Admitting: *Deleted

## 2020-02-07 ENCOUNTER — Other Ambulatory Visit: Payer: Self-pay | Admitting: Internal Medicine

## 2020-02-07 ENCOUNTER — Ambulatory Visit: Payer: Medicare Other | Admitting: Nutrition

## 2020-02-07 ENCOUNTER — Inpatient Hospital Stay (HOSPITAL_BASED_OUTPATIENT_CLINIC_OR_DEPARTMENT_OTHER): Payer: Medicare Other | Admitting: Internal Medicine

## 2020-02-07 VITALS — BP 133/52 | HR 74

## 2020-02-07 VITALS — BP 139/71 | HR 94 | Temp 97.8°F | Resp 17 | Ht 66.0 in | Wt 128.0 lb

## 2020-02-07 DIAGNOSIS — E860 Dehydration: Secondary | ICD-10-CM | POA: Insufficient documentation

## 2020-02-07 DIAGNOSIS — Z800 Family history of malignant neoplasm of digestive organs: Secondary | ICD-10-CM | POA: Insufficient documentation

## 2020-02-07 DIAGNOSIS — Z8042 Family history of malignant neoplasm of prostate: Secondary | ICD-10-CM | POA: Insufficient documentation

## 2020-02-07 DIAGNOSIS — F419 Anxiety disorder, unspecified: Secondary | ICD-10-CM | POA: Insufficient documentation

## 2020-02-07 DIAGNOSIS — Z5111 Encounter for antineoplastic chemotherapy: Secondary | ICD-10-CM

## 2020-02-07 DIAGNOSIS — Z9049 Acquired absence of other specified parts of digestive tract: Secondary | ICD-10-CM | POA: Insufficient documentation

## 2020-02-07 DIAGNOSIS — Z803 Family history of malignant neoplasm of breast: Secondary | ICD-10-CM | POA: Insufficient documentation

## 2020-02-07 DIAGNOSIS — I2609 Other pulmonary embolism with acute cor pulmonale: Secondary | ICD-10-CM | POA: Diagnosis not present

## 2020-02-07 DIAGNOSIS — C3490 Malignant neoplasm of unspecified part of unspecified bronchus or lung: Secondary | ICD-10-CM

## 2020-02-07 DIAGNOSIS — D649 Anemia, unspecified: Secondary | ICD-10-CM | POA: Insufficient documentation

## 2020-02-07 DIAGNOSIS — C50919 Malignant neoplasm of unspecified site of unspecified female breast: Secondary | ICD-10-CM | POA: Insufficient documentation

## 2020-02-07 DIAGNOSIS — Z9221 Personal history of antineoplastic chemotherapy: Secondary | ICD-10-CM | POA: Insufficient documentation

## 2020-02-07 DIAGNOSIS — Z79811 Long term (current) use of aromatase inhibitors: Secondary | ICD-10-CM | POA: Insufficient documentation

## 2020-02-07 DIAGNOSIS — Z8673 Personal history of transient ischemic attack (TIA), and cerebral infarction without residual deficits: Secondary | ICD-10-CM | POA: Insufficient documentation

## 2020-02-07 DIAGNOSIS — Z9013 Acquired absence of bilateral breasts and nipples: Secondary | ICD-10-CM | POA: Insufficient documentation

## 2020-02-07 DIAGNOSIS — E46 Unspecified protein-calorie malnutrition: Secondary | ICD-10-CM | POA: Insufficient documentation

## 2020-02-07 DIAGNOSIS — M818 Other osteoporosis without current pathological fracture: Secondary | ICD-10-CM

## 2020-02-07 DIAGNOSIS — Z79899 Other long term (current) drug therapy: Secondary | ICD-10-CM | POA: Insufficient documentation

## 2020-02-07 DIAGNOSIS — C3412 Malignant neoplasm of upper lobe, left bronchus or lung: Secondary | ICD-10-CM

## 2020-02-07 DIAGNOSIS — C182 Malignant neoplasm of ascending colon: Secondary | ICD-10-CM | POA: Diagnosis not present

## 2020-02-07 DIAGNOSIS — D638 Anemia in other chronic diseases classified elsewhere: Secondary | ICD-10-CM

## 2020-02-07 DIAGNOSIS — Z85038 Personal history of other malignant neoplasm of large intestine: Secondary | ICD-10-CM | POA: Insufficient documentation

## 2020-02-07 DIAGNOSIS — I2699 Other pulmonary embolism without acute cor pulmonale: Secondary | ICD-10-CM | POA: Diagnosis not present

## 2020-02-07 LAB — CMP (CANCER CENTER ONLY)
ALT: 27 U/L (ref 0–44)
AST: 25 U/L (ref 15–41)
Albumin: 3.1 g/dL — ABNORMAL LOW (ref 3.5–5.0)
Alkaline Phosphatase: 139 U/L — ABNORMAL HIGH (ref 38–126)
Anion gap: 12 (ref 5–15)
BUN: 24 mg/dL — ABNORMAL HIGH (ref 8–23)
CO2: 23 mmol/L (ref 22–32)
Calcium: 9.9 mg/dL (ref 8.9–10.3)
Chloride: 102 mmol/L (ref 98–111)
Creatinine: 1.13 mg/dL — ABNORMAL HIGH (ref 0.44–1.00)
GFR, Est AFR Am: 53 mL/min — ABNORMAL LOW (ref >60)
GFR, Estimated: 46 mL/min — ABNORMAL LOW (ref >60)
Glucose, Bld: 126 mg/dL — ABNORMAL HIGH (ref 70–99)
Potassium: 4.4 mmol/L (ref 3.5–5.1)
Sodium: 137 mmol/L (ref 135–145)
Total Bilirubin: 0.5 mg/dL (ref 0.3–1.2)
Total Protein: 7.1 g/dL (ref 6.5–8.1)

## 2020-02-07 LAB — URINALYSIS, COMPLETE (UACMP) WITH MICROSCOPIC
Bilirubin Urine: NEGATIVE
Glucose, UA: NEGATIVE mg/dL
Hgb urine dipstick: NEGATIVE
Ketones, ur: NEGATIVE mg/dL
Nitrite: POSITIVE — AB
Protein, ur: NEGATIVE mg/dL
Specific Gravity, Urine: 1.012 (ref 1.005–1.030)
pH: 5 (ref 5.0–8.0)

## 2020-02-07 LAB — CBC WITH DIFFERENTIAL (CANCER CENTER ONLY)
Abs Immature Granulocytes: 0.05 10*3/uL (ref 0.00–0.07)
Basophils Absolute: 0 10*3/uL (ref 0.0–0.1)
Basophils Relative: 0 %
Eosinophils Absolute: 0 10*3/uL (ref 0.0–0.5)
Eosinophils Relative: 0 %
HCT: 36.3 % (ref 36.0–46.0)
Hemoglobin: 11.7 g/dL — ABNORMAL LOW (ref 12.0–15.0)
Immature Granulocytes: 0 %
Lymphocytes Relative: 7 %
Lymphs Abs: 0.8 10*3/uL (ref 0.7–4.0)
MCH: 29.2 pg (ref 26.0–34.0)
MCHC: 32.2 g/dL (ref 30.0–36.0)
MCV: 90.5 fL (ref 80.0–100.0)
Monocytes Absolute: 1.4 10*3/uL — ABNORMAL HIGH (ref 0.1–1.0)
Monocytes Relative: 12 %
Neutro Abs: 9.2 10*3/uL — ABNORMAL HIGH (ref 1.7–7.7)
Neutrophils Relative %: 81 %
Platelet Count: 231 10*3/uL (ref 150–400)
RBC: 4.01 MIL/uL (ref 3.87–5.11)
RDW: 12.9 % (ref 11.5–15.5)
WBC Count: 11.5 10*3/uL — ABNORMAL HIGH (ref 4.0–10.5)
nRBC: 0 % (ref 0.0–0.2)

## 2020-02-07 MED ORDER — NITROFURANTOIN MONOHYD MACRO 100 MG PO CAPS: 100.0000 mg | ORAL_CAPSULE | Freq: Two times a day (BID) | ORAL | 0 refills | Status: DC

## 2020-02-07 MED ORDER — SODIUM CHLORIDE 0.9 % IV SOLN
INTRAVENOUS | Status: DC
  Filled 2020-02-07 (×2): qty 250

## 2020-02-07 MED ORDER — ONDANSETRON HCL 4 MG/2ML IJ SOLN
INTRAMUSCULAR | Status: AC
  Filled 2020-02-07: qty 4

## 2020-02-07 MED ORDER — PROCHLORPERAZINE MALEATE 10 MG PO TABS: 10.0000 mg | ORAL_TABLET | Freq: Four times a day (QID) | ORAL | 0 refills | Status: DC | PRN

## 2020-02-07 MED ORDER — ONDANSETRON HCL 4 MG/2ML IJ SOLN
8.0000 mg | Freq: Once | INTRAMUSCULAR | Status: AC
  Administered 2020-02-07: 8 mg via INTRAVENOUS

## 2020-02-07 NOTE — Progress Notes (Signed)
Oncology Nurse Navigator Documentation  Oncology Nurse Navigator Flowsheets 02/07/2020  Navigator Location [facility]  Navigator Encounter Type Clinic/MDC/I spoke with Kristin [name] today.  She is not feeling well today.  Per Dr. Worthy Flank request, I contacted dietitian.    Patient Visit Type MedOnc  Treatment Phase Follow-up;Treatment  Barriers/Navigation Needs Coordination of Care  Interventions Coordination of Care;Psycho-Social Support  Acuity Level 2-Minimal Needs (1-2 Barriers Identified)  Coordination of Care Other  Time Spent with Patient 15

## 2020-02-07 NOTE — Progress Notes (Signed)
Kristin [name] [phone]   Fax:(336) [phone]  OFFICE PROGRESS NOTE  Eber Hong, MD [address] [address]  DIAGNOSIS:  1) Metastatic non-small cell lung cancer, adenocarcinoma diagnosed in March 2012 with positive EGFR mutation in exon 21 ([id number]). 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 2) adenocarcinoma of the ascending colon (T2, N0, M0) with MSI high diagnosed in March 2017. 3) history of breast adenocarcinoma.  PRIOR THERAPY:  1) Tarceva 150 mg by mouth daily status post 68 months of treatment. 2) Status post right colectomy with lymph node dissection in May 2017.  CURRENT THERAPY: 1) Tagrisso 80 mg by mouth daily started 11/09/2016.  Status post 24 months of treatment. 2) observation for the history of colon adenocarcinoma. 3) Femara 2.5 mg by mouth daily for history of breast cancer.  INTERVAL HISTORY: Kristin [name] 79 y.o. female returns to the clinic today for follow-up visit accompanied by her daughter Kristin [name].  The patient is not feeling well today.  She has increasing fatigue and weakness as well as lack of appetite and weight loss.  She also has pain in the left lower rib cage.  She denied having any current fever or chills.  She received her 2 Covid vaccine shots, the last one was 3 weeks ago.  She denied having any current shortness of breath, cough or hemoptysis.  She has nausea but no vomiting, diarrhea or constipation.  She lost several pounds since her last visit.  She is here today for evaluation and repeat blood work.Marland Kitchen   MEDICAL HISTORY: Past Medical History:  Diagnosis Date  . Allergy   . Anemia   . Anemia of chronic disease 09/21/2016  . Anxiety   . Arthritis    arthritis- left hip  . Blood transfusion without reported diagnosis    transfusion- 3-4 yrs ago -found to be anemic on routine lab check  . Breast cancer ([place]) 2002   bilateral- bilateral mastectomies done.  .  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 CVA (cerebral infarction) 04/30/2013  . Encounter for antineoplastic chemotherapy 10/28/2016  . Family history of brain cancer   . Family history of breast cancer   . Family history of colon cancer 10/06/2009  . Family history of colon cancer   . Family history of prostate cancer   . GERD (gastroesophageal reflux disease)   . Glaucoma   . History of bilateral mastectomy 05/20/2010  . Hypertension   . Lung cancer ([place]) dx'd 12/2010   last Ct scan "no lung cancer" showing 11'16 CT Chest Epic.  . Malignant neoplasm of ascending colon ([place]) 01/29/2016  . Stroke [facility])    2 yrs ago-no residual  . Tubular adenoma of colon 07/2011   colon polyps ans reoccurence with malignancy found    ALLERGIES:  is allergic to doxycycline and sulfa antibiotics.  MEDICATIONS:  Current Outpatient Medications  Medication Sig Dispense Refill  . amLODipine (NORVASC) 5 MG tablet Take 1 tablet by mouth as directed. take one tablet in am and 1/2 tablet in pm-    . brimonidine-timolol (COMBIGAN) 0.2-0.5 % ophthalmic solution Place 1 drop into the right eye every 12 (twelve) hours.    . clopidogrel (PLAVIX) 75 MG tablet Take 75 mg by mouth daily.    . cycloSPORINE (RESTASIS) 0.05 % ophthalmic emulsion Place 1 drop into both eyes 2 (two) times daily.    Marland Kitchen escitalopram (LEXAPRO) 10 MG tablet Take 10 mg by mouth daily.     Marland Kitchen  famotidine (PEPCID) 40 MG tablet Take 40 mg by mouth daily.    Marland Kitchen FeFum-FePoly-FA-B Cmp-C-Biot (FOLIVANE-PLUS) CAPS TAKE 1 CAPSULE BY MOUTH EVERY DAY IN THE MORNING 90 capsule 2  . letrozole (FEMARA) 2.5 MG tablet TAKE 1 TABLET BY MOUTH EVERY DAY 90 tablet 1  . lisinopril (ZESTRIL) 20 MG tablet Take 20 mg by mouth daily.    Marland Kitchen osimertinib mesylate (TAGRISSO) 80 MG tablet Take 1 tablet (80 mg total) by mouth daily. 30 tablet 3  . Cholecalciferol (VITAMIN D3) 2000 units capsule Take 2,000 Units daily by mouth.    . feeding supplement, ENSURE ENLIVE, (ENSURE ENLIVE) LIQD  Take 237 mLs by mouth 2 (two) times daily between meals. (Patient not taking: Reported on 02/07/2020) 237 mL 12  . Multiple Vitamins-Iron (MULTIVITAMINS WITH IRON) TABS tablet Take 1 tablet by mouth daily. (Patient not taking: Reported on 02/07/2020)  0  . Multiple Vitamins-Minerals (PRESERVISION AREDS 2) CAPS Take by mouth.     Current Facility-Administered Medications  Medication Dose Route Frequency Provider Last Rate Last Admin  . 0.9 %  sodium chloride infusion  500 mL Intravenous Continuous Armbruster, Carlota Raspberry, MD        SURGICAL HISTORY:  Past Surgical History:  Procedure Laterality Date  . APPENDECTOMY    . BOWEL DECOMPRESSION N/A 06/12/2018   Procedure: BOWEL DECOMPRESSION;  Surgeon: Rush Landmark Telford Nab., MD;  Location: [facility];  Service: Gastroenterology;  Laterality: N/A;  . cataracts Bilateral   . COLECTOMY WITH COLOSTOMY CREATION/HARTMANN PROCEDURE  06/16/2018   Procedure: CREATION/HARTMANN COLOSTOMY;  Surgeon: Michael Boston, MD;  Location: [facility];  Service: General;;  . COLONOSCOPY    . COLOSTOMY N/A 06/16/2018   Procedure: POSSIBLE COLOSTOMY;  Surgeon: Michael Boston, MD;  Location: [facility];  Service: General;  Laterality: N/A;  . EYE SURGERY    . FLEXIBLE SIGMOIDOSCOPY N/A 06/12/2018   Procedure: FLEXIBLE SIGMOIDOSCOPY;  Surgeon: Rush Landmark Telford Nab., MD;  Location: [facility];  Service: Gastroenterology;  Laterality: N/A;  . LAPAROSCOPIC SIGMOID COLECTOMY N/A 06/16/2018   Procedure: LAPAROSCOPIC LOW ANTERIOR RESECTION;  Surgeon: Michael Boston, MD;  Location: [facility];  Service: General;  Laterality: N/A;  . LUNG SURGERY     Resection -" not done. Pt . tx with Tarceva  . MASTECTOMY Bilateral   . MEDIASTINOSCOPY N/A 10/02/2016   Procedure: MEDIASTINOSCOPY;  Surgeon: [name], MD;  Location[facility];  Service: Thoracic;  Laterality: N/A;  . RECONSTRUCTION BREAST W/ TRAM FLAP Bilateral    bilaterally  . robotic right hemicolectomy Right 03/06/2016   Dr.  Johney Maine  . TEE WITHOUT CARDIOVERSION N/A 06/04/2013   Procedure: TRANSESOPHAGEAL ECHOCARDIOGRAM (TEE);  Surgeon: Jolaine Artist, MD;  Location: [facility] ENDOSCOPY;  Service: Cardiovascular;  Laterality: N/A;  . TONSILLECTOMY    . TOTAL HIP ARTHROPLASTY Left 08/07/2016   Procedure: LEFT TOTAL HIP ARTHROPLASTY ANTERIOR APPROACH;  Surgeon: Gaynelle Arabian, MD;  Location: [facility];  Service: Orthopedics;  Laterality: Left;    REVIEW OF SYSTEMS:  Constitutional: positive for anorexia, fatigue and weight loss Eyes: negative Ears, nose, mouth, throat, and face: negative Respiratory: negative Cardiovascular: negative Gastrointestinal: positive for nausea Genitourinary:negative Integument/breast: negative Hematologic/lymphatic: negative Musculoskeletal:negative Neurological: negative Behavioral/Psych: negative Endocrine: negative Allergic/Immunologic: negative   PHYSICAL EXAMINATION: General appearance: alert, cooperative, fatigued and no distress Head: Normocephalic, without obvious abnormality, atraumatic Neck: no adenopathy, no JVD, supple, symmetrical, trachea midline and thyroid not enlarged, symmetric, no tenderness/mass/nodules Lymph nodes: Cervical, supraclavicular, and axillary nodes normal. Resp: clear to auscultation bilaterally Back:  symmetric, no curvature. ROM normal. No CVA tenderness. Cardio: regular rate and rhythm, S1, S2 normal, no murmur, click, rub or gallop GI: soft, non-tender; bowel sounds normal; no masses,  no organomegaly Extremities: extremities normal, atraumatic, no cyanosis or edema Neurologic: Alert and oriented X 3, normal strength and tone. Normal symmetric reflexes. Normal coordination and gait  ECOG PERFORMANCE STATUS: 1 - Symptomatic but completely ambulatory  Blood pressure 139/71, pulse 94, temperature 97.8 F (36.6 C), temperature source Temporal, resp. rate 17, height _0  (1.676 m), weight 128 lb (58.1 kg), SpO2 95 %.  LABORATORY DATA: Lab Results    Component Value Date   WBC 11.5 (H) 02/07/2020   HGB 11.7 (L) 02/07/2020   HCT 36.3 02/07/2020   MCV 90.5 02/07/2020   PLT 231 02/07/2020      Chemistry      Component Value Date/Time   NA 140 12/24/2019 1016   NA 138 09/26/2017 0942   K 4.4 12/24/2019 1016   K 5.2 No visable hemolysis (H) 09/26/2017 0942   CL 106 12/24/2019 1016   CL 107 04/05/2013 0754   CO2 27 12/24/2019 1016   CO2 26 09/26/2017 0942   BUN 21 12/24/2019 1016   BUN 16.0 09/26/2017 0942   CREATININE 1.22 (H) 12/24/2019 1016   CREATININE 1.2 (H) 09/26/2017 0942      Component Value Date/Time   CALCIUM 9.8 12/24/2019 1016   CALCIUM 9.9 09/26/2017 0942   ALKPHOS 102 12/24/2019 1016   ALKPHOS 68 09/26/2017 0942   AST 20 12/24/2019 1016   AST 18 09/26/2017 0942   ALT 14 12/24/2019 1016   ALT 13 09/26/2017 0942   BILITOT 0.3 12/24/2019 1016   BILITOT 0.31 09/26/2017 0942       RADIOGRAPHIC STUDIES: No results found.  ASSESSMENT AND PLAN:  This is a very pleasant 79 years old white female with metastatic non-small cell lung cancer, adenocarcinoma diagnosed in March 2012 with positive EGFR mutation status post 68 months of treatment with Tarceva discontinued secondary to disease progression and development of EGFR T790M resistant mutation. The patient is currently on treatment with Tagrisso 80 mg by mouth daily status post 26 months.  The patient has been tolerating her treatment well but recently she started having increasing fatigue and weakness as well as nausea, lack of appetite and dehydration.  This may not be unrelated to her treatment but the last scan showed some evidence for concerning disease progression. I recommended for the patient to have a PET scan as well as MRI of the brain performed next week for further evaluation of her disease and to rule out any other metastatic disease systemically or to the brain. For the dehydration and lack of appetite, I will arrange for the patient to receive 1 L  of normal saline with 8 mg of Zofran intravenously in the clinic today.  I will also check her urinalysis to rule out urinary tract infection. For the weight loss and malnutrition, I will refer the patient to the dietitian at the cancer center for evaluation and recommendation regarding her condition. For the anemia she will continue her current treatment with Integra +1 capsule p.o. daily. For the history of colon cancer she is currently on observation and no evidence of recurrence. For the history of breast cancer she will continue her current treatment with Femara. The patient will come back for follow-up visit in 2 weeks for evaluation and discussion of her scan results and recommendation regarding her condition.  She  was also advised to go immediately to the emergency department if she has no improvement or worsening of her condition. The patient voices understanding of current disease status and treatment options and is in agreement with the current care plan. All questions were answered. The patient knows to call the clinic with any problems, questions or concerns. We can certainly see the patient much sooner if necessary.  Disclaimer: This note was dictated with voice recognition software. Similar sounding words can inadvertently be transcribed and may not be corrected upon review.

## 2020-02-07 NOTE — Patient Instructions (Signed)

## 2020-02-07 NOTE — Progress Notes (Signed)
Pt received 1L IVF NS today and IV zofran, tolerated well.  VSS.  Able to eat/drink/ use restroom during tx w/out any issues.  Pt aware that Mohamed's office will call her regarding results of urine sample.  Pt reports feeling better at end of infusion w/no further nausea or flank pain.

## 2020-02-07 NOTE — Progress Notes (Signed)
I was asked by provider to see patient while receiving fluids.  Patient is an 79 year old female diagnosed with non-small cell lung cancer.  She is a patient of Dr. Julien Nordmann receiving [name] and Femara.  Past medical history includes breast cancer, colon cancer, hypertension, GERD, CVA, depression, anxiety, anemia, and colostomy.  Medications include Pepcid, vitamin D, multivitamin.  Labs include glucose 126, BUN 24, creatinine 1.13, and albumin 3.1.  Height: 66 inches. Weight: 128 pounds. Patient weighed 133.7 pounds on March 8. BMI: 20.66.  Patient reports she has felt "really bad" over the last week or so. She reports decreased appetite. She is weak and has had increased fatigue. Reports nausea started a couple days ago. She is eating soup and trying to drink Ensure.  Reports Ensure has bothered her and she reports she must be lactose intolerant. States no one has checked her colostomy and she would like someone to evaluate it today. MD has ordered IV fluids and IV nausea medication for patient today.  Nutrition diagnosis:  Unintended weight loss related to decreased oral intake as evidenced by 4% weight loss over the past 6 weeks.  Intervention: I educated patient on the importance of trying smaller more frequent meals and snacks with higher calorie, higher protein foods. Reviewed options for snacks and meals. Encourage patient to drink half bottle of Ensure Enlive slowly over 15 minutes and assess tolerance. Provided samples of Ensure clear as an option if patient does not tolerate Ensure Enlive. Educated patient on strategies for improving nausea and encouraged her to take nausea medication as needed to prevent nausea. Encouraged gradual increase in activity as tolerated. Provided patient with fact sheets.  Questions were answered.  Teach back method used.  Contact information given.  Monitoring, evaluation, goals: Patient will tolerate increased calories and protein to  minimize further weight loss.  Next visit: Patient will contact me for questions or concerns.  **Disclaimer: This note was dictated with voice recognition software. Similar sounding words can inadvertently be transcribed and this note may contain transcription errors which may not have been corrected upon publication of note.**

## 2020-02-09 ENCOUNTER — Inpatient Hospital Stay (HOSPITAL_COMMUNITY)
Admission: EM | Admit: 2020-02-09 | Discharge: 2020-02-19 | DRG: 175 | Disposition: A | Discharge status: Home Health | Payer: Medicare Other | Attending: Internal Medicine | Admitting: Internal Medicine

## 2020-02-09 ENCOUNTER — Telehealth: Payer: Self-pay | Admitting: *Deleted

## 2020-02-09 ENCOUNTER — Other Ambulatory Visit: Payer: Self-pay

## 2020-02-09 ENCOUNTER — Other Ambulatory Visit: Payer: Self-pay | Admitting: Hematology

## 2020-02-09 ENCOUNTER — Encounter (HOSPITAL_COMMUNITY): Payer: Self-pay | Admitting: *Deleted

## 2020-02-09 ENCOUNTER — Emergency Department (HOSPITAL_COMMUNITY): Payer: Medicare Other

## 2020-02-09 ENCOUNTER — Observation Stay (HOSPITAL_COMMUNITY): Payer: Medicare Other

## 2020-02-09 DIAGNOSIS — Z933 Colostomy status: Secondary | ICD-10-CM

## 2020-02-09 DIAGNOSIS — C50919 Malignant neoplasm of unspecified site of unspecified female breast: Secondary | ICD-10-CM | POA: Diagnosis present

## 2020-02-09 DIAGNOSIS — R7989 Other specified abnormal findings of blood chemistry: Secondary | ICD-10-CM | POA: Diagnosis present

## 2020-02-09 DIAGNOSIS — I480 Paroxysmal atrial fibrillation: Secondary | ICD-10-CM | POA: Diagnosis present

## 2020-02-09 DIAGNOSIS — K219 Gastro-esophageal reflux disease without esophagitis: Secondary | ICD-10-CM | POA: Diagnosis present

## 2020-02-09 DIAGNOSIS — I2699 Other pulmonary embolism without acute cor pulmonale: Secondary | ICD-10-CM | POA: Diagnosis present

## 2020-02-09 DIAGNOSIS — J90 Pleural effusion, not elsewhere classified: Secondary | ICD-10-CM | POA: Diagnosis present

## 2020-02-09 DIAGNOSIS — Z170 Estrogen receptor positive status [ER+]: Secondary | ICD-10-CM

## 2020-02-09 DIAGNOSIS — Z808 Family history of malignant neoplasm of other organs or systems: Secondary | ICD-10-CM

## 2020-02-09 DIAGNOSIS — I2609 Other pulmonary embolism with acute cor pulmonale: Principal | ICD-10-CM | POA: Diagnosis present

## 2020-02-09 DIAGNOSIS — Z79899 Other long term (current) drug therapy: Secondary | ICD-10-CM

## 2020-02-09 DIAGNOSIS — C3490 Malignant neoplasm of unspecified part of unspecified bronchus or lung: Secondary | ICD-10-CM | POA: Diagnosis present

## 2020-02-09 DIAGNOSIS — Z801 Family history of malignant neoplasm of trachea, bronchus and lung: Secondary | ICD-10-CM

## 2020-02-09 DIAGNOSIS — C182 Malignant neoplasm of ascending colon: Secondary | ICD-10-CM | POA: Diagnosis present

## 2020-02-09 DIAGNOSIS — J9601 Acute respiratory failure with hypoxia: Secondary | ICD-10-CM | POA: Diagnosis present

## 2020-02-09 DIAGNOSIS — I130 Hypertensive heart and chronic kidney disease with heart failure and stage 1 through stage 4 chronic kidney disease, or unspecified chronic kidney disease: Secondary | ICD-10-CM | POA: Diagnosis present

## 2020-02-09 DIAGNOSIS — E860 Dehydration: Secondary | ICD-10-CM | POA: Diagnosis present

## 2020-02-09 DIAGNOSIS — Z881 Allergy status to other antibiotic agents status: Secondary | ICD-10-CM

## 2020-02-09 DIAGNOSIS — Z9889 Other specified postprocedural states: Secondary | ICD-10-CM

## 2020-02-09 DIAGNOSIS — J9600 Acute respiratory failure, unspecified whether with hypoxia or hypercapnia: Secondary | ICD-10-CM | POA: Diagnosis present

## 2020-02-09 DIAGNOSIS — E039 Hypothyroidism, unspecified: Secondary | ICD-10-CM | POA: Diagnosis present

## 2020-02-09 DIAGNOSIS — Z800 Family history of malignant neoplasm of digestive organs: Secondary | ICD-10-CM

## 2020-02-09 DIAGNOSIS — B9620 Unspecified Escherichia coli [E. coli] as the cause of diseases classified elsewhere: Secondary | ICD-10-CM | POA: Diagnosis present

## 2020-02-09 DIAGNOSIS — E0590 Thyrotoxicosis, unspecified without thyrotoxic crisis or storm: Secondary | ICD-10-CM | POA: Diagnosis present

## 2020-02-09 DIAGNOSIS — D630 Anemia in neoplastic disease: Secondary | ICD-10-CM | POA: Diagnosis present

## 2020-02-09 DIAGNOSIS — Z8249 Family history of ischemic heart disease and other diseases of the circulatory system: Secondary | ICD-10-CM

## 2020-02-09 DIAGNOSIS — I82442 Acute embolism and thrombosis of left tibial vein: Secondary | ICD-10-CM | POA: Diagnosis present

## 2020-02-09 DIAGNOSIS — Z96642 Presence of left artificial hip joint: Secondary | ICD-10-CM | POA: Diagnosis present

## 2020-02-09 DIAGNOSIS — Z85118 Personal history of other malignant neoplasm of bronchus and lung: Secondary | ICD-10-CM

## 2020-02-09 DIAGNOSIS — Z79811 Long term (current) use of aromatase inhibitors: Secondary | ICD-10-CM

## 2020-02-09 DIAGNOSIS — I10 Essential (primary) hypertension: Secondary | ICD-10-CM | POA: Diagnosis present

## 2020-02-09 DIAGNOSIS — R531 Weakness: Principal | ICD-10-CM

## 2020-02-09 DIAGNOSIS — I5033 Acute on chronic diastolic (congestive) heart failure: Secondary | ICD-10-CM | POA: Diagnosis present

## 2020-02-09 DIAGNOSIS — E440 Moderate protein-calorie malnutrition: Secondary | ICD-10-CM | POA: Diagnosis present

## 2020-02-09 DIAGNOSIS — J910 Malignant pleural effusion: Secondary | ICD-10-CM | POA: Diagnosis present

## 2020-02-09 DIAGNOSIS — I4819 Other persistent atrial fibrillation: Secondary | ICD-10-CM | POA: Diagnosis present

## 2020-02-09 DIAGNOSIS — R0602 Shortness of breath: Secondary | ICD-10-CM

## 2020-02-09 DIAGNOSIS — N390 Urinary tract infection, site not specified: Secondary | ICD-10-CM | POA: Diagnosis present

## 2020-02-09 DIAGNOSIS — F419 Anxiety disorder, unspecified: Secondary | ICD-10-CM | POA: Diagnosis present

## 2020-02-09 DIAGNOSIS — I82452 Acute embolism and thrombosis of left peroneal vein: Secondary | ICD-10-CM | POA: Diagnosis present

## 2020-02-09 DIAGNOSIS — Z9013 Acquired absence of bilateral breasts and nipples: Secondary | ICD-10-CM

## 2020-02-09 DIAGNOSIS — Z8673 Personal history of transient ischemic attack (TIA), and cerebral infarction without residual deficits: Secondary | ICD-10-CM

## 2020-02-09 DIAGNOSIS — Z85038 Personal history of other malignant neoplasm of large intestine: Secondary | ICD-10-CM

## 2020-02-09 DIAGNOSIS — Z9221 Personal history of antineoplastic chemotherapy: Secondary | ICD-10-CM

## 2020-02-09 DIAGNOSIS — Z803 Family history of malignant neoplasm of breast: Secondary | ICD-10-CM

## 2020-02-09 DIAGNOSIS — Z87440 Personal history of urinary (tract) infections: Secondary | ICD-10-CM

## 2020-02-09 DIAGNOSIS — Z882 Allergy status to sulfonamides status: Secondary | ICD-10-CM

## 2020-02-09 DIAGNOSIS — J869 Pyothorax without fistula: Secondary | ICD-10-CM | POA: Diagnosis present

## 2020-02-09 DIAGNOSIS — N183 Chronic kidney disease, stage 3 unspecified: Secondary | ICD-10-CM | POA: Diagnosis present

## 2020-02-09 DIAGNOSIS — C3412 Malignant neoplasm of upper lobe, left bronchus or lung: Secondary | ICD-10-CM | POA: Diagnosis present

## 2020-02-09 DIAGNOSIS — Z6821 Body mass index (BMI) 21.0-21.9, adult: Secondary | ICD-10-CM

## 2020-02-09 DIAGNOSIS — Z8042 Family history of malignant neoplasm of prostate: Secondary | ICD-10-CM

## 2020-02-09 DIAGNOSIS — Z7902 Long term (current) use of antithrombotics/antiplatelets: Secondary | ICD-10-CM

## 2020-02-09 DIAGNOSIS — R509 Fever, unspecified: Secondary | ICD-10-CM | POA: Diagnosis present

## 2020-02-09 DIAGNOSIS — Z20822 Contact with and (suspected) exposure to covid-19: Secondary | ICD-10-CM | POA: Diagnosis present

## 2020-02-09 DIAGNOSIS — I471 Supraventricular tachycardia: Secondary | ICD-10-CM | POA: Diagnosis not present

## 2020-02-09 DIAGNOSIS — N1831 Chronic kidney disease, stage 3a: Secondary | ICD-10-CM | POA: Diagnosis present

## 2020-02-09 DIAGNOSIS — I5082 Biventricular heart failure: Secondary | ICD-10-CM | POA: Diagnosis present

## 2020-02-09 DIAGNOSIS — D631 Anemia in chronic kidney disease: Secondary | ICD-10-CM | POA: Diagnosis present

## 2020-02-09 LAB — COMPREHENSIVE METABOLIC PANEL
ALT: 58 U/L — ABNORMAL HIGH (ref 0–44)
AST: 55 U/L — ABNORMAL HIGH (ref 15–41)
Albumin: 2.9 g/dL — ABNORMAL LOW (ref 3.5–5.0)
Alkaline Phosphatase: 186 U/L — ABNORMAL HIGH (ref 38–126)
Anion gap: 11 (ref 5–15)
BUN: 28 mg/dL — ABNORMAL HIGH (ref 8–23)
CO2: 22 mmol/L (ref 22–32)
Calcium: 9.6 mg/dL (ref 8.9–10.3)
Chloride: 102 mmol/L (ref 98–111)
Creatinine, Ser: 1.21 mg/dL — ABNORMAL HIGH (ref 0.44–1.00)
GFR calc Af Amer: 49 mL/min — ABNORMAL LOW (ref >60)
GFR calc non Af Amer: 42 mL/min — ABNORMAL LOW (ref >60)
Glucose, Bld: 182 mg/dL — ABNORMAL HIGH (ref 70–99)
Potassium: 4.7 mmol/L (ref 3.5–5.1)
Sodium: 135 mmol/L (ref 135–145)
Total Bilirubin: 0.7 mg/dL (ref 0.3–1.2)
Total Protein: 6.6 g/dL (ref 6.5–8.1)

## 2020-02-09 LAB — CBC WITH DIFFERENTIAL/PLATELET
Abs Immature Granulocytes: 0.08 10*3/uL — ABNORMAL HIGH (ref 0.00–0.07)
Basophils Absolute: 0 10*3/uL (ref 0.0–0.1)
Basophils Relative: 0 %
Eosinophils Absolute: 0 10*3/uL (ref 0.0–0.5)
Eosinophils Relative: 0 %
HCT: 33.5 % — ABNORMAL LOW (ref 36.0–46.0)
Hemoglobin: 10.6 g/dL — ABNORMAL LOW (ref 12.0–15.0)
Immature Granulocytes: 1 %
Lymphocytes Relative: 7 %
Lymphs Abs: 0.8 10*3/uL (ref 0.7–4.0)
MCH: 28.8 pg (ref 26.0–34.0)
MCHC: 31.6 g/dL (ref 30.0–36.0)
MCV: 91 fL (ref 80.0–100.0)
Monocytes Absolute: 1.6 10*3/uL — ABNORMAL HIGH (ref 0.1–1.0)
Monocytes Relative: 13 %
Neutro Abs: 9.6 10*3/uL — ABNORMAL HIGH (ref 1.7–7.7)
Neutrophils Relative %: 79 %
Platelets: 165 10*3/uL (ref 150–400)
RBC: 3.68 MIL/uL — ABNORMAL LOW (ref 3.87–5.11)
RDW: 12.8 % (ref 11.5–15.5)
WBC: 12.1 10*3/uL — ABNORMAL HIGH (ref 4.0–10.5)
nRBC: 0 % (ref 0.0–0.2)

## 2020-02-09 LAB — URINALYSIS, ROUTINE W REFLEX MICROSCOPIC
Bilirubin Urine: NEGATIVE
Glucose, UA: NEGATIVE mg/dL
Hgb urine dipstick: NEGATIVE
Ketones, ur: NEGATIVE mg/dL
Nitrite: NEGATIVE
Protein, ur: 100 mg/dL — AB
Specific Gravity, Urine: 1.031 — ABNORMAL HIGH (ref 1.005–1.030)
WBC, UA: >50 WBC/hpf — ABNORMAL HIGH (ref 0–5)
pH: 5 (ref 5.0–8.0)

## 2020-02-09 LAB — LACTIC ACID, PLASMA
Lactic Acid, Venous: 1.6 mmol/L (ref 0.5–1.9)
Lactic Acid, Venous: 1 mmol/L (ref 0.5–1.9)

## 2020-02-09 LAB — PROTIME-INR
INR: 1.3 — ABNORMAL HIGH (ref 0.8–1.2)
Prothrombin Time: 16.5 seconds — ABNORMAL HIGH (ref 11.4–15.2)

## 2020-02-09 LAB — APTT: aPTT: 35 seconds (ref 24–36)

## 2020-02-09 MED ORDER — HEPARIN BOLUS VIA INFUSION
2000.0000 [IU] | Freq: Once | INTRAVENOUS | Status: AC
  Administered 2020-02-09: 2000 [IU] via INTRAVENOUS
  Filled 2020-02-09: qty 2000

## 2020-02-09 MED ORDER — PIPERACILLIN-TAZOBACTAM 3.375 G IVPB 30 MIN
3.3750 g | Freq: Once | INTRAVENOUS | Status: AC
  Administered 2020-02-09: 3.375 g via INTRAVENOUS
  Filled 2020-02-09: qty 50

## 2020-02-09 MED ORDER — SODIUM CHLORIDE 0.9 % IV BOLUS (SEPSIS)
2000.0000 mL | Freq: Once | INTRAVENOUS | Status: AC
  Administered 2020-02-09: 2000 mL via INTRAVENOUS

## 2020-02-09 MED ORDER — HEPARIN (PORCINE) 25000 UT/250ML-% IV SOLN
900.0000 [IU]/h | INTRAVENOUS | Status: DC
  Administered 2020-02-09: 900 [IU]/h via INTRAVENOUS
  Filled 2020-02-09: qty 250

## 2020-02-09 MED ORDER — VANCOMYCIN HCL 1250 MG/250ML IV SOLN
1250.0000 mg | Freq: Once | INTRAVENOUS | Status: AC
  Administered 2020-02-09: 1250 mg via INTRAVENOUS
  Filled 2020-02-09: qty 250

## 2020-02-09 MED ORDER — IOHEXOL 350 MG/ML SOLN
100.0000 mL | Freq: Once | INTRAVENOUS | Status: AC | PRN
  Administered 2020-02-09: 23:00:00 80 mL via INTRAVENOUS

## 2020-02-09 MED ORDER — SODIUM CHLORIDE (PF) 0.9 % IJ SOLN
INTRAMUSCULAR | Status: AC
  Filled 2020-02-09: qty 50

## 2020-02-09 MED ORDER — VANCOMYCIN HCL IN DEXTROSE 1-5 GM/200ML-% IV SOLN: 1000.0000 mg | Freq: Once | INTRAVENOUS | Status: DC

## 2020-02-09 NOTE — ED Notes (Signed)
Pt assisted with bedpan and peri care provided.

## 2020-02-09 NOTE — ED Notes (Signed)
Patient came back from CT with complaints of increased SOB. Patients SPO2 was 85% on 2L. Patients oxygen was titrated up to 6L. SPO2 currently 92%.

## 2020-02-09 NOTE — Telephone Encounter (Signed)
Oncology Nurse Navigator Documentation  Oncology Nurse Navigator Flowsheets 02/09/2020  Navigator Location [facility]  Navigator Encounter Type Telephone/I called to check on Kristin [name].  She is not doing well.  I updated Dr. Julien Nordmann. I called the patient back.  I spoke with the daughter and states she is not doing well. I updated Dr. Julien Nordmann again and he states to go to ED.  I called back to update to go to ED.    Telephone Outgoing Call  Patient Visit Type MedOnc  Treatment Phase Treatment  Barriers/Navigation Needs Coordination of Care;Education  Education Other  Interventions Coordination of Care;Education  Acuity Level 2-Minimal Needs (1-2 Barriers Identified)  Coordination of Care Other  Time Spent with Patient 39

## 2020-02-09 NOTE — ED Triage Notes (Signed)
Pt presents with increased shob and weakness,lung cancer, noticed increased weakness over last week, Monday at Cancer center, fluids and antibiotic for UTI. Fever yesterday 101.3 Tylenol effective

## 2020-02-09 NOTE — Progress Notes (Signed)
ANTICOAGULATION CONSULT NOTE - Initial Consult  Pharmacy Consult for Heparin Indication: pulmonary embolus  Allergies  Allergen Reactions  . Doxycycline Nausea Only    Weak, stomach  . Sulfa Antibiotics Rash    Patient Measurements: Height: 5\' 6"  (167.6 cm) Weight: 58 kg (127 lb 13.9 oz) IBW/kg (Calculated) : 59.3 Heparin Dosing Weight:   Vital Signs: Temp: 98.3 F (36.8 C) (04/21 1717) Temp Source: Oral (04/21 1717) BP: 117/76 (04/21 2300) Pulse Rate: 102 (04/21 2300)  Labs: Recent Labs    02/07/20 1021 02/09/20 1723  HGB 11.7* 10.6*  HCT 36.3 33.5*  PLT 231 165  APTT  --  35  LABPROT  --  16.5*  INR  --  1.3*  CREATININE 1.13* 1.21*    Estimated Creatinine Clearance: 34 mL/min (A) (by C-G formula based on SCr of 1.21 mg/dL (H)).   Medical History: Past Medical History:  Diagnosis Date  . Allergy   . Anemia   . Anemia of chronic disease 09/21/2016  . Anxiety   . Arthritis    arthritis- left hip  . Blood transfusion without reported diagnosis    transfusion- 3-4 yrs ago -found to be anemic on routine lab check  . Breast cancer ([place]) 2002   bilateral- bilateral mastectomies done.  . 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 CVA (cerebral infarction) 04/30/2013  . Encounter for antineoplastic chemotherapy 10/28/2016  . Family history of brain cancer   . Family history of breast cancer   . Family history of colon cancer 10/06/2009  . Family history of colon cancer   . Family history of prostate cancer   . GERD (gastroesophageal reflux disease)   . Glaucoma   . History of bilateral mastectomy 05/20/2010  . Hypertension   . Lung cancer ([place]) dx'd 12/2010   last Ct scan "no lung cancer" showing 11'16 CT Chest Epic.  . Malignant neoplasm of ascending colon ([place]) 01/29/2016  . Stroke [facility])    2 yrs ago-no residual  . Tubular adenoma of colon 07/2011   colon polyps ans reoccurence with malignancy found    Medications:  Infusions:  . sodium chloride    . [START ON  02/10/2020] heparin      Assessment: Patient with PE.  No oral anticoagulants noted on med rec. Baseline coags WNL.    Goal of Therapy:  Heparin level 0.3-0.7 units/ml Monitor platelets by anticoagulation protocol: Yes   Plan:  Heparin bolus 2000 units iv x1 Heparin drip at 900 units/hr Daily CBC Next heparin level at 0900    Tyler Deis, Shea Stakes [place] 02/09/2020,11:48 PM

## 2020-02-09 NOTE — ED Provider Notes (Signed)
[facility] DEPT Provider Note   CSN: [id number] Arrival date & time: 02/09/20  1651     History Chief Complaint  Patient presents with  . Shortness of Breath  . Weakness    Kristin [name] is a 79 y.o. female.  Patient presents with a fever since Sunday.  Patient has a history of lung cancer and she was seen by her oncologist on Monday who started her on Macrobid for urinary tract infection.  Patient also complains of shortness of breath now  The history is provided by the patient and a relative. No language interpreter was used.  Weakness Severity:  Moderate Onset quality:  Sudden Timing:  Constant Progression:  Worsening Chronicity:  Recurrent Context: not alcohol use   Relieved by:  Nothing Worsened by:  Nothing Ineffective treatments:  None tried Associated symptoms: no abdominal pain, no chest pain, no cough, no diarrhea, no frequency, no headaches and no seizures   Risk factors: no anemia        Past Medical History:  Diagnosis Date  . Allergy   . Anemia   . Anemia of chronic disease 09/21/2016  . Anxiety   . Arthritis    arthritis- left hip  . Blood transfusion without reported diagnosis    transfusion- 3-4 yrs ago -found to be anemic on routine lab check  . Breast cancer ([place]) 2002   bilateral- bilateral mastectomies done.  . 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 CVA (cerebral infarction) 04/30/2013  . Encounter for antineoplastic chemotherapy 10/28/2016  . Family history of brain cancer   . Family history of breast cancer   . Family history of colon cancer 10/06/2009  . Family history of colon cancer   . Family history of prostate cancer   . GERD (gastroesophageal reflux disease)   . Glaucoma   . History of bilateral mastectomy 05/20/2010  . Hypertension   . Lung cancer ([place]) dx'd 12/2010   last Ct scan "no lung cancer" showing 11'16 CT Chest Epic.  . Malignant neoplasm of ascending colon ([place]) 01/29/2016  . Stroke [facility])    2  yrs ago-no residual  . Tubular adenoma of colon 07/2011   colon polyps ans reoccurence with malignancy found    Patient Active Problem List   Diagnosis Date Noted  . 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 Malnutrition of moderate degree 06/15/2018  . CKD (chronic kidney disease), stage III 06/11/2018  . Large bowel obstruction ([place]) 06/11/2018  . Acute lower UTI 06/11/2018  . Encounter for antineoplastic chemotherapy 10/28/2016  . Anemia of chronic disease 09/21/2016  . 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 S/P hip replacement, left 08/07/2016  . 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 Genetic testing 06/20/2016  . Family history of breast cancer   . Family history of colon cancer   . Family history of brain cancer   . Family history of prostate cancer   . Melanocytic nevi of trunk 03/07/2016  . Seborrheic keratoses of trunk 03/06/2016  . Status post partial colectomy 03/06/2016  . 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 Osteoporosis 04/25/2015  . Disc disorder 05/30/2014  . Arthritis of left hip 05/30/2014  . Occipital cerebral infarction ([place]) 04/30/2013  . Hypertension 04/29/2013  . Nonspecific abnormal finding in stool contents 07/31/2011  . Esophageal reflux 07/31/2011  . Benign neoplasm of colon 07/31/2011  . Hernia, hiatal 07/31/2011  .  Cancer of upper lobe of left lung ([place]) 06/13/2011  . Personal history of breast cancer 06/13/2011  . Diverticulosis of colon (without mention of hemorrhage) 06/13/2011  . Adenocarcinoma of lung ([place]) 01/01/2011  . Status of breast implant 05/20/2010  . Menopausal syndrome 07/03/2004  . Hypercholesterolemia 01/05/2002  . Absence of bladder continence 12/02/2000  . Depression 12/02/2000    Past Surgical History:  Procedure Laterality Date  . APPENDECTOMY    . BOWEL DECOMPRESSION N/A 06/12/2018     Procedure: BOWEL DECOMPRESSION;  Surgeon: Rush Landmark Telford Nab., MD;  Location: [facility];  Service: Gastroenterology;  Laterality: N/A;  . cataracts Bilateral   . COLECTOMY WITH COLOSTOMY CREATION/HARTMANN PROCEDURE  06/16/2018   Procedure: CREATION/HARTMANN COLOSTOMY;  Surgeon: Michael Boston, MD;  Location: [facility]S;  Service: General;;  . COLONOSCOPY    . COLOSTOMY N/A 06/16/2018   Procedure: POSSIBLE COLOSTOMY;  Surgeon: Michael Boston, MD;  Location: [facility];  Service: General;  Laterality: N/A;  . EYE SURGERY    . FLEXIBLE SIGMOIDOSCOPY N/A 06/12/2018   Procedure: FLEXIBLE SIGMOIDOSCOPY;  Surgeon: Rush Landmark Telford Nab., MD;  Location: [facility];  Service: Gastroenterology;  Laterality: N/A;  . LAPAROSCOPIC SIGMOID COLECTOMY N/A 06/16/2018   Procedure: LAPAROSCOPIC LOW ANTERIOR RESECTION;  Surgeon: Michael Boston, MD;  Location: [facility];  Service: General;  Laterality: N/A;  . LUNG SURGERY     Resection -" not done. Pt . tx with Tarceva  . MASTECTOMY Bilateral   . MEDIASTINOSCOPY N/A 10/02/2016   Procedure: MEDIASTINOSCOPY;  Surgeon: [name], MD;  Location: [facility] OR;  Service: Thoracic;  Laterality: N/A;  . RECONSTRUCTION BREAST W/ TRAM FLAP Bilateral    bilaterally  . robotic right hemicolectomy Right 03/06/2016   Dr. Johney Maine  . TEE WITHOUT CARDIOVERSION N/A 06/04/2013   Procedure: TRANSESOPHAGEAL ECHOCARDIOGRAM (TEE);  Surgeon: Jolaine Artist, MD;  Location: [facility] ENDOSCOPY;  Service: Cardiovascular;  Laterality: N/A;  . TONSILLECTOMY    . TOTAL HIP ARTHROPLASTY Left 08/07/2016   Procedure: LEFT TOTAL HIP ARTHROPLASTY ANTERIOR APPROACH;  Surgeon: Gaynelle Arabian, MD;  Location: [facility];  Service: Orthopedics;  Laterality: Left;     OB History   No obstetric history on file.     Family History  Problem Relation Age of Onset  . Lung cancer Father   . Heart disease Mother   . Clotting disorder Mother 88       coronary thrombosis  . Colon cancer Sister        dx in  her 39s  . Breast cancer Sister 38  . Multiple myeloma Paternal Grandmother   . Brain cancer Paternal Uncle   . Cancer Paternal Aunt        unknown  . Colon cancer Maternal Aunt 80  . Prostate cancer Maternal Uncle        dx in his late 66s  . Other Maternal Uncle        brain tumor dx in his 78s  . Lung cancer Maternal Uncle   . Cancer Maternal Uncle        NOS  . Stomach cancer Paternal Aunt   . Brain cancer Cousin        maternal first cousin dx in late 86s-50s  . Cancer Paternal Uncle        NOS  . Colon cancer Cousin        paternal cousin dx in his 45s  . Cancer Cousin        paternal cousin dx with NOS  cancer    Social History   Tobacco Use  . Smoking status: Never Smoker  . Smokeless tobacco: Never Used  Substance Use Topics  . Alcohol use: Yes    Alcohol/week: 0.0 standard drinks    Comment: ocassional beer  . Drug use: No    Home Medications Prior to Admission medications   Medication Sig Start Date End Date Taking? Authorizing Provider  amLODipine (NORVASC) 5 MG tablet Take 5 tablets by mouth in the morning and at bedtime.  03/08/19  Yes Eber Hong, MD  brimonidine-timolol (COMBIGAN) 0.2-0.5 % ophthalmic solution Place 1 drop into the right eye every 12 (twelve) hours.   Yes [provider]  Cholecalciferol (VITAMIN D3) 2000 units capsule Take 2,000 Units daily by mouth.   Yes [provider]  clopidogrel (PLAVIX) 75 MG tablet Take 75 mg by mouth daily. 06/25/16  Yes [provider]  cycloSPORINE (RESTASIS) 0.05 % ophthalmic emulsion Place 1 drop into both eyes 2 (two) times daily.   Yes [provider]  escitalopram (LEXAPRO) 10 MG tablet Take 10 mg by mouth daily.  05/20/13  Yes Philmore Pali, NP  famotidine (PEPCID) 40 MG tablet Take 40 mg by mouth daily.   Yes [provider]  FeFum-FePoly-FA-B Cmp-C-Biot (FOLIVANE-PLUS) CAPS TAKE 1 CAPSULE BY MOUTH EVERY DAY IN THE MORNING Patient taking differently: Take 1  capsule by mouth daily.  12/20/19  Yes Curt Bears, MD  letrozole [facility]) 2.5 MG tablet TAKE 1 TABLET BY MOUTH EVERY DAY Patient taking differently: Take 2.5 mg by mouth daily.  03/08/19  Yes Curt Bears, MD  lisinopril (ZESTRIL) 20 MG tablet Take 20 mg by mouth daily. 02/01/20  Yes [provider]  Multiple Vitamins-Minerals (PRESERVISION AREDS 2) CAPS Take by mouth.   Yes [provider]  nitrofurantoin, macrocrystal-monohydrate, (MACROBID) 100 MG capsule Take 1 capsule (100 mg total) by mouth 2 (two) times daily. 02/07/20  Yes Curt Bears, MD  osimertinib mesylate (TAGRISSO) 80 MG tablet Take 1 tablet (80 mg total) by mouth daily. 01/07/20  Yes Heilingoetter, Cassandra L, PA-C  prochlorperazine (COMPAZINE) 10 MG tablet Take 1 tablet (10 mg total) by mouth every 6 (six) hours as needed for nausea or vomiting. 02/07/20  Yes Curt Bears, MD  feeding supplement, ENSURE ENLIVE, (ENSURE ENLIVE) LIQD Take 237 mLs by mouth 2 (two) times daily between meals. Patient not taking: Reported on 02/07/2020 06/25/18   Margie Billet A, PA-C  Multiple Vitamins-Iron (MULTIVITAMINS WITH IRON) TABS tablet Take 1 tablet by mouth daily. Patient not taking: Reported on 02/07/2020 06/25/18   Margie Billet A, PA-C    Allergies    Doxycycline and Sulfa antibiotics  Review of Systems   Review of Systems  Constitutional: Negative for appetite change and fatigue.  HENT: Negative for congestion, ear discharge and sinus pressure.   Eyes: Negative for discharge.  Respiratory: Negative for cough.   Cardiovascular: Negative for chest pain.  Gastrointestinal: Negative for abdominal pain and diarrhea.  Genitourinary: Negative for frequency and hematuria.  Musculoskeletal: Negative for back pain.  Skin: Negative for rash.  Neurological: Positive for weakness. Negative for seizures and headaches.  Psychiatric/Behavioral: Negative for hallucinations.    Physical Exam Updated Vital Signs BP  134/68   Pulse 90   Temp 98.3 F (36.8 C) (Oral)   Resp (!) 42   Ht '5\' 6"'$  (1.676 m)   Wt 58 kg   SpO2 90%   BMI 20.64 kg/m   Physical Exam Vitals and nursing  note reviewed.  Constitutional:      Appearance: She is well-developed.  HENT:     Head: Normocephalic.     Nose: Nose normal.  Eyes:     General: No scleral icterus.    Conjunctiva/sclera: Conjunctivae normal.  Neck:     Thyroid: No thyromegaly.  Cardiovascular:     Rate and Rhythm: Normal rate and regular rhythm.     Heart sounds: No murmur. No friction rub. No gallop.   Pulmonary:     Breath sounds: No stridor. No wheezing or rales.  Chest:     Chest wall: No tenderness.  Abdominal:     General: There is no distension.     Tenderness: There is no abdominal tenderness. There is no rebound.  Musculoskeletal:        General: Normal range of motion.     Cervical back: Neck supple.  Lymphadenopathy:     Cervical: No cervical adenopathy.  Skin:    Findings: No erythema or rash.  Neurological:     Mental Status: She is oriented to person, place, and time.     Motor: No abnormal muscle tone.     Coordination: Coordination normal.  Psychiatric:        Behavior: Behavior normal.     ED Results / Procedures / Treatments   Labs (all labs ordered are listed, but only abnormal results are displayed) Labs Reviewed  COMPREHENSIVE METABOLIC PANEL - Abnormal; Notable for the following components:      Result Value   Glucose, Bld 182 (*)    BUN 28 (*)    Creatinine, Ser 1.21 (*)    Albumin 2.9 (*)    AST 55 (*)    ALT 58 (*)    Alkaline Phosphatase 186 (*)    GFR calc non Af Amer 42 (*)    GFR calc Af Amer 49 (*)    All other components within normal limits  CBC WITH DIFFERENTIAL/PLATELET - Abnormal; Notable for the following components:   WBC 12.1 (*)    RBC 3.68 (*)    Hemoglobin 10.6 (*)    HCT 33.5 (*)    Neutro Abs 9.6 (*)    Monocytes Absolute 1.6 (*)    Abs Immature Granulocytes 0.08 (*)    All  other components within normal limits  PROTIME-INR - Abnormal; Notable for the following components:   Prothrombin Time 16.5 (*)    INR 1.3 (*)    All other components within normal limits  URINALYSIS, ROUTINE W REFLEX MICROSCOPIC - Abnormal; Notable for the following components:   APPearance HAZY (*)    Specific Gravity, Urine 1.031 (*)    Protein, ur 100 (*)    Leukocytes,Ua LARGE (*)    WBC, UA >50 (*)    Bacteria, UA RARE (*)    All other components within normal limits  CULTURE, BLOOD (ROUTINE X 2)  CULTURE, BLOOD (ROUTINE X 2)  URINE CULTURE  LACTIC ACID, PLASMA  LACTIC ACID, PLASMA  APTT    EKG None  Radiology DG Chest Port 1 View  Result Date: 02/09/2020 CLINICAL DATA:  Short of breath EXAM: PORTABLE CHEST 1 VIEW COMPARISON:  06/24/2018, CT 12/24/2019 FINDINGS: Postoperative changes in the left upper lung. Moderate left pleural effusion increased compared to prior. Probable trace right effusion. Airspace disease at the left base. Obscured cardiomediastinal silhouette. Hiatal hernia. Convex opacity over the lower mediastinal silhouette. IMPRESSION: 1. Postsurgical changes of the left chest. Moderate left pleural effusion, increased compared to  prior. Probable trace right pleural effusion. Airspace disease at the left lung base. Electronically Signed   By: Donavan Foil M.D.   On: 02/09/2020 18:07    Procedures Procedures (including critical care time)  Medications Ordered in ED Medications  sodium chloride 0.9 % bolus 2,000 mL (2,000 mLs Intravenous New Bag/Given 02/09/20 1748)  piperacillin-tazobactam (ZOSYN) IVPB 3.375 g (0 g Intravenous Stopped 02/09/20 1818)  vancomycin (VANCOREADY) IVPB 1250 mg/250 mL (0 mg Intravenous Stopped 02/09/20 2001)    ED Course  I have reviewed the triage vital signs and the nursing notes.  Pertinent labs & imaging results that were available during my care of the patient were reviewed by me and considered in my medical decision making  (see chart for details).    MDM Rules/Calculators/A&P                      Patient with worsening pleural effusion on the left side.  Patient also has oxygen requirement with room air and sats 86%.  Patient also has worsening urinary tract infection.  She will be admitted to medicine and oncology has been contacted    This patient presents to the ED for concern of weakness, this involves an extensive number of treatment options, and is a complaint that carries with it a high risk of complications and morbidity.  The differential diagnosis includes sepsis worsening cancer   Lab Tests:   I Ordered, reviewed, and interpreted labs, which included CBC chemistries urinalysis.  Patient has anemia elevated white count and urinary tract infection  Medicines ordered:   I ordered medication antibiotics for sepsis  Imaging Studies ordered:   I ordered imaging studies which included chest x-ray and  I independently visualized and interpreted imaging which showed worsening pleural fluid on the left  Additional history obtained:   Additional history obtained from daughter  Previous records obtained and reviewed  Consultations Obtained:   I consulted oncology and hospitalist and discussed lab and imaging findings  Reevaluation:  After the interventions stated above, I reevaluated the patient and found some improvement with fluids and antibiotics  Critical Interventions:  .   Final Clinical Impression(s) / ED Diagnoses Final diagnoses:  Weakness    Rx / DC Orders ED Discharge Orders    None       Milton Ferguson, MD 02/09/20 2210

## 2020-02-09 NOTE — ED Notes (Signed)
O2 decreased to 2L [place]

## 2020-02-09 NOTE — Progress Notes (Signed)
A consult was received from an ED physician for vancomycin & zosyn per pharmacy dosing.  The patient's profile has been reviewed for ht/wt/allergies/indication/available labs.   A one time order has been placed for vancomycin 1250 mg and zosyn 3.375 mg IV x 1 dose.    Further antibiotics/pharmacy consults should be ordered by admitting physician if indicated.                       Thank you, Eudelia Bunch, Pharm.D 02/09/2020 5:25 PM

## 2020-02-09 NOTE — ED Notes (Signed)
Patients visitor made aware of visitation hours.

## 2020-02-09 NOTE — ED Notes (Signed)
Pt repositioned in bed and given mouth swabs to moisten mouth. Daughter at bedside. Call bell within reach.

## 2020-02-10 ENCOUNTER — Telehealth: Payer: Self-pay | Admitting: Internal Medicine

## 2020-02-10 ENCOUNTER — Encounter: Payer: Self-pay | Admitting: Internal Medicine

## 2020-02-10 ENCOUNTER — Encounter (HOSPITAL_COMMUNITY): Payer: Self-pay | Admitting: Internal Medicine

## 2020-02-10 ENCOUNTER — Inpatient Hospital Stay (HOSPITAL_COMMUNITY): Payer: Medicare Other

## 2020-02-10 DIAGNOSIS — I2699 Other pulmonary embolism without acute cor pulmonale: Secondary | ICD-10-CM | POA: Diagnosis present

## 2020-02-10 DIAGNOSIS — Z9889 Other specified postprocedural states: Secondary | ICD-10-CM | POA: Diagnosis not present

## 2020-02-10 DIAGNOSIS — N390 Urinary tract infection, site not specified: Secondary | ICD-10-CM

## 2020-02-10 DIAGNOSIS — C3492 Malignant neoplasm of unspecified part of left bronchus or lung: Secondary | ICD-10-CM | POA: Diagnosis not present

## 2020-02-10 DIAGNOSIS — I2609 Other pulmonary embolism with acute cor pulmonale: Principal | ICD-10-CM

## 2020-02-10 DIAGNOSIS — I5082 Biventricular heart failure: Secondary | ICD-10-CM | POA: Diagnosis present

## 2020-02-10 DIAGNOSIS — E039 Hypothyroidism, unspecified: Secondary | ICD-10-CM | POA: Diagnosis present

## 2020-02-10 DIAGNOSIS — C182 Malignant neoplasm of ascending colon: Secondary | ICD-10-CM

## 2020-02-10 DIAGNOSIS — I5033 Acute on chronic diastolic (congestive) heart failure: Secondary | ICD-10-CM | POA: Diagnosis not present

## 2020-02-10 DIAGNOSIS — E440 Moderate protein-calorie malnutrition: Secondary | ICD-10-CM | POA: Diagnosis not present

## 2020-02-10 DIAGNOSIS — Z6821 Body mass index (BMI) 21.0-21.9, adult: Secondary | ICD-10-CM | POA: Diagnosis not present

## 2020-02-10 DIAGNOSIS — I10 Essential (primary) hypertension: Secondary | ICD-10-CM | POA: Diagnosis not present

## 2020-02-10 DIAGNOSIS — C3490 Malignant neoplasm of unspecified part of unspecified bronchus or lung: Secondary | ICD-10-CM | POA: Diagnosis not present

## 2020-02-10 DIAGNOSIS — N1832 Chronic kidney disease, stage 3b: Secondary | ICD-10-CM

## 2020-02-10 DIAGNOSIS — R0602 Shortness of breath: Secondary | ICD-10-CM

## 2020-02-10 DIAGNOSIS — N1831 Chronic kidney disease, stage 3a: Secondary | ICD-10-CM | POA: Diagnosis present

## 2020-02-10 DIAGNOSIS — I82452 Acute embolism and thrombosis of left peroneal vein: Secondary | ICD-10-CM | POA: Diagnosis not present

## 2020-02-10 DIAGNOSIS — J9600 Acute respiratory failure, unspecified whether with hypoxia or hypercapnia: Secondary | ICD-10-CM | POA: Diagnosis not present

## 2020-02-10 DIAGNOSIS — C3412 Malignant neoplasm of upper lobe, left bronchus or lung: Secondary | ICD-10-CM | POA: Diagnosis not present

## 2020-02-10 DIAGNOSIS — J910 Malignant pleural effusion: Secondary | ICD-10-CM | POA: Diagnosis not present

## 2020-02-10 DIAGNOSIS — J90 Pleural effusion, not elsewhere classified: Secondary | ICD-10-CM

## 2020-02-10 DIAGNOSIS — Z20822 Contact with and (suspected) exposure to covid-19: Secondary | ICD-10-CM | POA: Diagnosis not present

## 2020-02-10 DIAGNOSIS — J869 Pyothorax without fistula: Secondary | ICD-10-CM | POA: Diagnosis not present

## 2020-02-10 DIAGNOSIS — B9620 Unspecified Escherichia coli [E. coli] as the cause of diseases classified elsewhere: Secondary | ICD-10-CM | POA: Diagnosis present

## 2020-02-10 DIAGNOSIS — I480 Paroxysmal atrial fibrillation: Secondary | ICD-10-CM | POA: Diagnosis present

## 2020-02-10 DIAGNOSIS — I82442 Acute embolism and thrombosis of left tibial vein: Secondary | ICD-10-CM | POA: Diagnosis not present

## 2020-02-10 DIAGNOSIS — R509 Fever, unspecified: Secondary | ICD-10-CM

## 2020-02-10 DIAGNOSIS — D631 Anemia in chronic kidney disease: Secondary | ICD-10-CM | POA: Diagnosis present

## 2020-02-10 DIAGNOSIS — I471 Supraventricular tachycardia: Secondary | ICD-10-CM | POA: Diagnosis not present

## 2020-02-10 DIAGNOSIS — Z87440 Personal history of urinary (tract) infections: Secondary | ICD-10-CM | POA: Diagnosis not present

## 2020-02-10 DIAGNOSIS — I4819 Other persistent atrial fibrillation: Secondary | ICD-10-CM | POA: Diagnosis not present

## 2020-02-10 DIAGNOSIS — C3491 Malignant neoplasm of unspecified part of right bronchus or lung: Secondary | ICD-10-CM | POA: Diagnosis not present

## 2020-02-10 DIAGNOSIS — J9601 Acute respiratory failure with hypoxia: Secondary | ICD-10-CM | POA: Diagnosis not present

## 2020-02-10 DIAGNOSIS — I130 Hypertensive heart and chronic kidney disease with heart failure and stage 1 through stage 4 chronic kidney disease, or unspecified chronic kidney disease: Secondary | ICD-10-CM | POA: Diagnosis not present

## 2020-02-10 DIAGNOSIS — Z85038 Personal history of other malignant neoplasm of large intestine: Secondary | ICD-10-CM | POA: Diagnosis not present

## 2020-02-10 LAB — BODY FLUID CELL COUNT WITH DIFFERENTIAL
Lymphs, Fluid: 23 %
Monocyte-Macrophage-Serous Fluid: 73 % (ref 50–90)
Neutrophil Count, Fluid: 4 % (ref 0–25)
Total Nucleated Cell Count, Fluid: 2057 uL — ABNORMAL HIGH (ref 0–1000)

## 2020-02-10 LAB — ECHOCARDIOGRAM COMPLETE
Height: 66 in
Weight: 2045.87 oz

## 2020-02-10 LAB — CBC
HCT: 27.2 % — ABNORMAL LOW (ref 36.0–46.0)
Hemoglobin: 8.5 g/dL — ABNORMAL LOW (ref 12.0–15.0)
MCH: 28.7 pg (ref 26.0–34.0)
MCHC: 31.3 g/dL (ref 30.0–36.0)
MCV: 91.9 fL (ref 80.0–100.0)
Platelets: 123 10*3/uL — ABNORMAL LOW (ref 150–400)
RBC: 2.96 MIL/uL — ABNORMAL LOW (ref 3.87–5.11)
RDW: 13 % (ref 11.5–15.5)
WBC: 16.7 10*3/uL — ABNORMAL HIGH (ref 4.0–10.5)
nRBC: 0 % (ref 0.0–0.2)

## 2020-02-10 LAB — PROTEIN, PLEURAL OR PERITONEAL FLUID: Total protein, fluid: 5.1 g/dL

## 2020-02-10 LAB — BRAIN NATRIURETIC PEPTIDE: B Natriuretic Peptide: 717.3 pg/mL — ABNORMAL HIGH (ref 0.0–100.0)

## 2020-02-10 LAB — BASIC METABOLIC PANEL
Anion gap: 9 (ref 5–15)
BUN: 25 mg/dL — ABNORMAL HIGH (ref 8–23)
CO2: 21 mmol/L — ABNORMAL LOW (ref 22–32)
Calcium: 9.1 mg/dL (ref 8.9–10.3)
Chloride: 109 mmol/L (ref 98–111)
Creatinine, Ser: 0.97 mg/dL (ref 0.44–1.00)
GFR calc Af Amer: >60 mL/min (ref >60)
GFR calc non Af Amer: 55 mL/min — ABNORMAL LOW (ref >60)
Glucose, Bld: 169 mg/dL — ABNORMAL HIGH (ref 70–99)
Potassium: 4.7 mmol/L (ref 3.5–5.1)
Sodium: 139 mmol/L (ref 135–145)

## 2020-02-10 LAB — LACTATE DEHYDROGENASE, PLEURAL OR PERITONEAL FLUID: LD, Fluid: 317 U/L — ABNORMAL HIGH (ref 3–23)

## 2020-02-10 LAB — GLUCOSE, PLEURAL OR PERITONEAL FLUID: Glucose, Fluid: 58 mg/dL

## 2020-02-10 LAB — HEPARIN LEVEL (UNFRACTIONATED)
Heparin Unfractionated: 0.19 IU/mL — ABNORMAL LOW (ref 0.30–0.70)
Heparin Unfractionated: 0.27 IU/mL — ABNORMAL LOW (ref 0.30–0.70)

## 2020-02-10 LAB — SARS CORONAVIRUS 2 (TAT 6-24 HRS): SARS Coronavirus 2: NEGATIVE

## 2020-02-10 LAB — TROPONIN I (HIGH SENSITIVITY)
Troponin I (High Sensitivity): 681 ng/L (ref <18)
Troponin I (High Sensitivity): 637 ng/L (ref <18)

## 2020-02-10 LAB — MRSA PCR SCREENING: MRSA by PCR: NEGATIVE

## 2020-02-10 MED ORDER — BRIMONIDINE TARTRATE-TIMOLOL 0.2-0.5 % OP SOLN: 1.0000 [drp] | Freq: Two times a day (BID) | OPHTHALMIC | Status: DC

## 2020-02-10 MED ORDER — ORAL CARE MOUTH RINSE: 15.0000 mL | Freq: Two times a day (BID) | OROMUCOSAL | Status: DC

## 2020-02-10 MED ORDER — ADULT MULTIVITAMIN W/MINERALS CH
1.0000 | ORAL_TABLET | Freq: Every day | ORAL | Status: DC
  Administered 2020-02-10 – 2020-02-19 (×10): 1 via ORAL
  Filled 2020-02-10 (×11): qty 1

## 2020-02-10 MED ORDER — FENTANYL CITRATE (PF) 100 MCG/2ML IJ SOLN: 25.0000 ug | Freq: Once | INTRAMUSCULAR | Status: AC

## 2020-02-10 MED ORDER — FENTANYL CITRATE (PF) 100 MCG/2ML IJ SOLN
INTRAMUSCULAR | Status: AC
  Administered 2020-02-10: 25 ug
  Filled 2020-02-10: qty 2

## 2020-02-10 MED ORDER — LISINOPRIL 10 MG PO TABS
20.0000 mg | ORAL_TABLET | Freq: Every day | ORAL | Status: DC
  Administered 2020-02-10 – 2020-02-15 (×6): 20 mg via ORAL
  Filled 2020-02-10 (×6): qty 2
  Filled 2020-02-10: qty 1

## 2020-02-10 MED ORDER — ORAL CARE MOUTH RINSE
15.0000 mL | Freq: Two times a day (BID) | OROMUCOSAL | Status: DC
  Administered 2020-02-10 – 2020-02-19 (×16): 15 mL via OROMUCOSAL

## 2020-02-10 MED ORDER — FENTANYL CITRATE (PF) 100 MCG/2ML IJ SOLN
25.0000 ug | Freq: Once | INTRAMUSCULAR | Status: AC
  Administered 2020-02-10: 25 ug via INTRAVENOUS
  Filled 2020-02-10: qty 2

## 2020-02-10 MED ORDER — OXYCODONE-ACETAMINOPHEN 5-325 MG PO TABS
1.0000 | ORAL_TABLET | Freq: Four times a day (QID) | ORAL | Status: DC | PRN
  Administered 2020-02-15 – 2020-02-17 (×2): 1 via ORAL
  Filled 2020-02-10 (×2): qty 1

## 2020-02-10 MED ORDER — TAB-A-VITE/IRON PO TABS: 1.0000 | ORAL_TABLET | Freq: Every day | ORAL | Status: DC

## 2020-02-10 MED ORDER — ESCITALOPRAM OXALATE 10 MG PO TABS
10.0000 mg | ORAL_TABLET | Freq: Every day | ORAL | Status: DC
  Administered 2020-02-10 – 2020-02-19 (×10): 10 mg via ORAL
  Filled 2020-02-10 (×11): qty 1

## 2020-02-10 MED ORDER — OSIMERTINIB MESYLATE 80 MG PO TABS: 80.0000 mg | ORAL_TABLET | Freq: Every day | ORAL | Status: DC

## 2020-02-10 MED ORDER — TIMOLOL MALEATE 0.5 % OP SOLN
1.0000 [drp] | Freq: Two times a day (BID) | OPHTHALMIC | Status: DC
  Administered 2020-02-11 – 2020-02-19 (×13): 1 [drp] via OPHTHALMIC
  Filled 2020-02-10 (×2): qty 5

## 2020-02-10 MED ORDER — AMLODIPINE BESYLATE 5 MG PO TABS
5.0000 mg | ORAL_TABLET | Freq: Two times a day (BID) | ORAL | Status: DC
  Administered 2020-02-10 (×3): 5 mg via ORAL
  Filled 2020-02-10 (×3): qty 1

## 2020-02-10 MED ORDER — CLOPIDOGREL BISULFATE 75 MG PO TABS
75.0000 mg | ORAL_TABLET | Freq: Every day | ORAL | Status: DC
  Administered 2020-02-10 – 2020-02-19 (×10): 75 mg via ORAL
  Filled 2020-02-10 (×10): qty 1

## 2020-02-10 MED ORDER — BRIMONIDINE TARTRATE 0.2 % OP SOLN
1.0000 [drp] | Freq: Two times a day (BID) | OPHTHALMIC | Status: DC
  Administered 2020-02-10 – 2020-02-19 (×19): 1 [drp] via OPHTHALMIC
  Filled 2020-02-10 (×2): qty 5

## 2020-02-10 MED ORDER — ALPRAZOLAM 0.25 MG PO TABS
0.2500 mg | ORAL_TABLET | Freq: Once | ORAL | Status: AC | PRN
  Administered 2020-02-17: 0.25 mg via ORAL
  Filled 2020-02-10 (×2): qty 1

## 2020-02-10 MED ORDER — ONDANSETRON HCL 4 MG PO TABS
4.0000 mg | ORAL_TABLET | Freq: Four times a day (QID) | ORAL | Status: DC | PRN
  Administered 2020-02-17: 4 mg via ORAL
  Filled 2020-02-10: qty 1

## 2020-02-10 MED ORDER — FENTANYL CITRATE (PF) 100 MCG/2ML IJ SOLN
25.0000 ug | Freq: Once | INTRAMUSCULAR | Status: AC
  Administered 2020-02-10: 25 ug via INTRAVENOUS

## 2020-02-10 MED ORDER — PANTOPRAZOLE SODIUM 40 MG PO TBEC
40.0000 mg | DELAYED_RELEASE_TABLET | Freq: Every day | ORAL | Status: DC
  Administered 2020-02-11 – 2020-02-19 (×9): 40 mg via ORAL
  Filled 2020-02-10 (×9): qty 1

## 2020-02-10 MED ORDER — CHLORHEXIDINE GLUCONATE CLOTH 2 % EX PADS
6.0000 | MEDICATED_PAD | Freq: Every day | CUTANEOUS | Status: DC
  Administered 2020-02-10 – 2020-02-16 (×7): 6 via TOPICAL

## 2020-02-10 MED ORDER — SODIUM CHLORIDE 0.9 % IV SOLN
500.0000 mL | INTRAVENOUS | Status: DC
  Administered 2020-02-10 (×2): 500 mL via INTRAVENOUS

## 2020-02-10 MED ORDER — VITAMIN D 25 MCG (1000 UNIT) PO TABS
2000.0000 [IU] | ORAL_TABLET | Freq: Every day | ORAL | Status: DC
  Administered 2020-02-10 – 2020-02-19 (×10): 2000 [IU] via ORAL
  Filled 2020-02-10 (×11): qty 2

## 2020-02-10 MED ORDER — CYCLOSPORINE 0.05 % OP EMUL
1.0000 [drp] | Freq: Two times a day (BID) | OPHTHALMIC | Status: DC
  Administered 2020-02-10 – 2020-02-19 (×19): 1 [drp] via OPHTHALMIC
  Filled 2020-02-10 (×21): qty 1

## 2020-02-10 MED ORDER — FAMOTIDINE 20 MG PO TABS
20.0000 mg | ORAL_TABLET | Freq: Every day | ORAL | Status: DC
  Administered 2020-02-10 – 2020-02-19 (×10): 20 mg via ORAL
  Filled 2020-02-10 (×10): qty 1

## 2020-02-10 MED ORDER — ONDANSETRON HCL 4 MG/2ML IJ SOLN: 4.0000 mg | Freq: Four times a day (QID) | INTRAMUSCULAR | Status: DC | PRN

## 2020-02-10 MED ORDER — SODIUM CHLORIDE 0.9 % IV SOLN
1.0000 g | Freq: Every day | INTRAVENOUS | Status: DC
  Administered 2020-02-10 – 2020-02-12 (×4): 1 g via INTRAVENOUS
  Filled 2020-02-10 (×4): qty 10
  Filled 2020-02-10 (×2): qty 1

## 2020-02-10 MED ORDER — ENSURE ENLIVE PO LIQD
237.0000 mL | Freq: Two times a day (BID) | ORAL | Status: DC
  Administered 2020-02-11 – 2020-02-19 (×8): 237 mL via ORAL
  Filled 2020-02-10 (×2): qty 237

## 2020-02-10 MED ORDER — HEPARIN (PORCINE) 25000 UT/250ML-% IV SOLN
1250.0000 [IU]/h | INTRAVENOUS | Status: AC
  Administered 2020-02-10: 1150 [IU]/h via INTRAVENOUS
  Filled 2020-02-10: qty 250

## 2020-02-10 MED ORDER — PERFLUTREN LIPID MICROSPHERE
1.0000 mL | INTRAVENOUS | Status: AC | PRN
  Administered 2020-02-14: 09:00:00 2 mL via INTRAVENOUS
  Filled 2020-02-10: qty 10

## 2020-02-10 MED ORDER — LETROZOLE 2.5 MG PO TABS
2.5000 mg | ORAL_TABLET | Freq: Every day | ORAL | Status: DC
  Administered 2020-02-10 – 2020-02-19 (×10): 2.5 mg via ORAL
  Filled 2020-02-10 (×10): qty 1

## 2020-02-10 MED ORDER — FENTANYL CITRATE (PF) 100 MCG/2ML IJ SOLN
INTRAMUSCULAR | Status: AC
  Filled 2020-02-10: qty 2

## 2020-02-10 MED ORDER — HEPARIN (PORCINE) 25000 UT/250ML-% IV SOLN: 1050.0000 [IU]/h | INTRAVENOUS | Status: DC

## 2020-02-10 NOTE — Plan of Care (Signed)
RN will continue to monitor patient's progression of care plan.  

## 2020-02-10 NOTE — Progress Notes (Signed)
Received pt to RM 1438 from the ED, pt BP noted, resp 33 w/dyspneic on exertion. Pt daughter at [facility]. Pt  O2 sat 95 on 6 liters [place].  Tele verified.just received notification pt has new order to transfer to [place], order confirmed. Pt to transfer to 1231. Will call report to [facility]. SRP, RN

## 2020-02-10 NOTE — Progress Notes (Signed)
Admitted by Dr Hal Hope this morning for RLL PE.  I saw and examined the patient at bedside, patient's daughter was at bedside as well.  They tell me that patient has been living sedentary lifestyle for the last several weeks due to weakness.  She received both of her doses COVDI vaccine (Moderna) about 3 weeks ago.  During my evaluation she felt slightly better denied any chest pain or shortness of breath especially at rest.  On physical exam she is currently on 6 L nasal cannula saturating about 98%, heart rate fluctuating between 90-110, stable blood pressure.  She does have bibasilar rhonchi especially more on the left.  Overall she is elderly frail-appearing but not in any acute distress.  Abdomen is nontender nondistended.  Her lab work has been reviewed  Has right lower lobe pulmonary embolism complicated by elevated troponin and pleural effusion.  Currently she is on heparin drip, will continue this for now.  Requested Pulm to see the patient for possible thoracentesis.  Echocardiogram, lower extremity Dopplers.  Holding off on cardiology consult as elevated troponins are likely secondary to cardiac stress/demand ischemia.  If echo abnormal, cardiology will be happy to consult.  Also spoke with family regarding differences of Coumadin versus Xarelto versus Eliquis.  Eventually they are interested in Xarelto or Eliquis at the time of discharge.  Urinary tract infection without hematuria-follow-up culture data, empiric IV Rocephin.  Rest of the medical management as planned earlier.  Call with questions as needed. Patient's daughter Updated.   Time Spent :30 mins  Gerlean Ren MD [facility]

## 2020-02-10 NOTE — Progress Notes (Signed)
  Echocardiogram 2D Echocardiogram has been performed.  Kristin [name] A [name] 02/10/2020, 3:17 PM

## 2020-02-10 NOTE — H&P (Signed)
History and Physical    Kristin [name] MBW:[id number] DOB: 05/23/1941 DOA: 02/09/2020  PCP: Eber Hong, MD  Patient coming from: Home.  Chief Complaint: Fever and shortness of breath.  HPI: Kristin [name] is a 79 y.o. female with history of metastatic non-small cell lung cancer with history of colon cancer status post colostomy and history of breast cancer with history of hypertension CVA has been experiencing increasing shortness of breath and weakness over the last few days with increasing weight loss had followed up with her oncologist 3 days ago at that time patient was placed on fluids and empirically started on Macrobid.  Despite which patient is still having increasing weakness and the last 2 days had fevers noted at home with temperature 101 F.  Which prompted patient to come to the ER.  ED Course: In the ER patient was afebrile appears generally weak.  Labs reveal creatinine 1.2 hemoglobin 10.3 WBC 10.2 lactic acid was normal.  EKG shows normal sinus rhythm.  AST 55 ALT 58 and albumin 2.9.  CT angiogram of the chest shows segmental right lower lobe pulmonary embolism with small clot burden and enlarging left pleural effusion which is loculated concerning for recurrence of patient's known cancer.  CT renal study was unremarkable.  UA is consistent with UTI.  Patient is started on empiric antibiotics for UTI heparin for pulmonary embolism and admitted for further management.  Review of Systems: As per HPI, rest all negative.   Past Medical History:  Diagnosis Date  . Allergy   . Anemia   . Anemia of chronic disease 09/21/2016  . Anxiety   . Arthritis    arthritis- left hip  . Blood transfusion without reported diagnosis    transfusion- 3-4 yrs ago -found to be anemic on routine lab check  . Breast cancer ([place]) 2002   bilateral- bilateral mastectomies done.  . 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 CVA (cerebral infarction) 04/30/2013  . Encounter for antineoplastic chemotherapy  10/28/2016  . Family history of brain cancer   . Family history of breast cancer   . Family history of colon cancer 10/06/2009  . Family history of colon cancer   . Family history of prostate cancer   . GERD (gastroesophageal reflux disease)   . Glaucoma   . History of bilateral mastectomy 05/20/2010  . Hypertension   . Lung cancer ([place]) dx'd 12/2010   last Ct scan "no lung cancer" showing 11'16 CT Chest Epic.  . Malignant neoplasm of ascending colon ([place]) 01/29/2016  . Stroke [facility])    2 yrs ago-no residual  . Tubular adenoma of colon 07/2011   colon polyps ans reoccurence with malignancy found    Past Surgical History:  Procedure Laterality Date  . APPENDECTOMY    . BOWEL DECOMPRESSION N/A 06/12/2018   Procedure: BOWEL DECOMPRESSION;  Surgeon: Rush Landmark Telford Nab., MD;  Location: [facility];  Service: Gastroenterology;  Laterality: N/A;  . cataracts Bilateral   . COLECTOMY WITH COLOSTOMY CREATION/HARTMANN PROCEDURE  06/16/2018   Procedure: CREATION/HARTMANN COLOSTOMY;  Surgeon: Michael Boston, MD;  Location: [facility];  Service: General;;  . COLONOSCOPY    . COLOSTOMY N/A 06/16/2018   Procedure: POSSIBLE COLOSTOMY;  Surgeon: Michael Boston, MD;  Location: [facility];  Service: General;  Laterality: N/A;  . EYE SURGERY    . FLEXIBLE SIGMOIDOSCOPY N/A 06/12/2018   Procedure: FLEXIBLE SIGMOIDOSCOPY;  Surgeon: Rush Landmark Telford Nab., MD;  Location: [facility];  Service: Gastroenterology;  Laterality: N/A;  . LAPAROSCOPIC SIGMOID COLECTOMY N/A 06/16/2018  Procedure: LAPAROSCOPIC LOW ANTERIOR RESECTION;  Surgeon: Michael Boston, MD;  Location: [facility];  Service: General;  Laterality: N/A;  . LUNG SURGERY     Resection -" not done. Pt . tx with Tarceva  . MASTECTOMY Bilateral   . MEDIASTINOSCOPY N/A 10/02/2016   Procedure: MEDIASTINOSCOPY;  Surgeon: [name], MD;  Location: [facility];  Service: Thoracic;  Laterality: N/A;  . RECONSTRUCTION BREAST W/ TRAM FLAP Bilateral    bilaterally    . robotic right hemicolectomy Right 03/06/2016   Dr. Johney Maine  . TEE WITHOUT CARDIOVERSION N/A 06/04/2013   Procedure: TRANSESOPHAGEAL ECHOCARDIOGRAM (TEE);  Surgeon: Jolaine Artist, MD;  Location: [facility] ENDOSCOPY;  Service: Cardiovascular;  Laterality: N/A;  . TONSILLECTOMY    . TOTAL HIP ARTHROPLASTY Left 08/07/2016   Procedure: LEFT TOTAL HIP ARTHROPLASTY ANTERIOR APPROACH;  Surgeon: Gaynelle Arabian, MD;  Location: [facility];  Service: Orthopedics;  Laterality: Left;     reports that she has never smoked. She has never used smokeless tobacco. She reports current alcohol use. She reports that she does not use drugs.  Allergies  Allergen Reactions  . Doxycycline Nausea Only    Weak, stomach  . Sulfa Antibiotics Rash    Family History  Problem Relation Age of Onset  . Lung cancer Father   . Heart disease Mother   . Clotting disorder Mother 10       coronary thrombosis  . Colon cancer Sister        dx in her 73s  . Breast cancer Sister 43  . Multiple myeloma Paternal Grandmother   . Brain cancer Paternal Uncle   . Cancer Paternal Aunt        unknown  . Colon cancer Maternal Aunt 80  . Prostate cancer Maternal Uncle        dx in his late 22s  . Other Maternal Uncle        brain tumor dx in his 45s  . Lung cancer Maternal Uncle   . Cancer Maternal Uncle        NOS  . Stomach cancer Paternal Aunt   . Brain cancer Cousin        maternal first cousin dx in late 1s-50s  . Cancer Paternal Uncle        NOS  . Colon cancer Cousin        paternal cousin dx in his 52s  . Cancer Cousin        paternal cousin dx with NOS cancer    Prior to Admission medications   Medication Sig Start Date End Date Taking? Authorizing Provider  amLODipine (NORVASC) 5 MG tablet Take 5 tablets by mouth in the morning and at bedtime.  03/08/19  Yes Eber Hong, MD  brimonidine-timolol (COMBIGAN) 0.2-0.5 % ophthalmic solution Place 1 drop into the right eye every 12 (twelve) hours.   Yes [provider]  Cholecalciferol (VITAMIN D3) 2000 units capsule Take 2,000 Units daily by mouth.   Yes [provider]  clopidogrel (PLAVIX) 75 MG tablet Take 75 mg by mouth daily. 06/25/16  Yes [provider]  cycloSPORINE (RESTASIS) 0.05 % ophthalmic emulsion Place 1 drop into both eyes 2 (two) times daily.   Yes [provider]  escitalopram (LEXAPRO) 10 MG tablet Take 10 mg by mouth daily.  05/20/13  Yes Philmore Pali, NP  famotidine (PEPCID) 40 MG tablet Take 40 mg by mouth daily.   Yes [provider]  FeFum-FePoly-FA-B Cmp-C-Biot [facility]son) CAPS TAKE  1 CAPSULE BY MOUTH EVERY DAY IN THE MORNING Patient taking differently: Take 1 capsule by mouth daily.  12/20/19  Yes Curt Bears, MD  letrozole [facility]) 2.5 MG tablet TAKE 1 TABLET BY MOUTH EVERY DAY Patient taking differently: Take 2.5 mg by mouth daily.  03/08/19  Yes Curt Bears, MD  lisinopril (ZESTRIL) 20 MG tablet Take 20 mg by mouth daily. 02/01/20  Yes [provider]  Multiple Vitamins-Minerals (PRESERVISION AREDS 2) CAPS Take by mouth.   Yes [provider]  nitrofurantoin, macrocrystal-monohydrate, (MACROBID) 100 MG capsule Take 1 capsule (100 mg total) by mouth 2 (two) times daily. 02/07/20  Yes Curt Bears, MD  osimertinib mesylate (TAGRISSO) 80 MG tablet Take 1 tablet (80 mg total) by mouth daily. 01/07/20  Yes Heilingoetter, Cassandra L, PA-C  prochlorperazine (COMPAZINE) 10 MG tablet Take 1 tablet (10 mg total) by mouth every 6 (six) hours as needed for nausea or vomiting. 02/07/20  Yes Curt Bears, MD  feeding supplement, ENSURE ENLIVE, (ENSURE ENLIVE) LIQD Take 237 mLs by mouth 2 (two) times daily between meals. Patient not taking: Reported on 02/07/2020 06/25/18   Margie Billet A, PA-C  Multiple Vitamins-Iron (MULTIVITAMINS WITH IRON) TABS tablet Take 1 tablet by mouth daily. Patient not taking: Reported on 02/07/2020 06/25/18   Wellington Hampshire, PA-C    Physical  Exam: Constitutional: Moderately built and nourished. Vitals:   02/10/20 0400 02/10/20 0430 02/10/20 0500 02/10/20 0530  BP: 131/71 130/69 131/70 125/71  Pulse: 92 93 93 91  Resp: (!) 37 (!) 36 (!) 31 (!) 35  Temp:      TempSrc:      SpO2: 99% 99% 100% 99%  Weight:      Height:       Eyes: Anicteric no pallor. ENMT: No discharge from the ears eyes nose or mouth. Neck: No mass felt.  No neck rigidity. Respiratory: No rhonchi or crepitations. Cardiovascular: S1-S2 heard. Abdomen: Soft nontender bowel sound present.  Colostomy seen. Musculoskeletal: No edema. Skin: No rash. Neurologic: Alert awake oriented time place and person.  Moves all extremities. Psychiatric: Appears normal with normal affect.   Labs on Admission: I have personally reviewed following labs and imaging studies  CBC: Recent Labs  Lab 02/07/20 1021 02/09/20 1723 02/10/20 0332  WBC 11.5* 12.1* 16.7*  NEUTROABS 9.2* 9.6*  --   HGB 11.7* 10.6* 8.5*  HCT 36.3 33.5* 27.2*  MCV 90.5 91.0 91.9  PLT 231 165 017*   Basic Metabolic Panel: Recent Labs  Lab 02/07/20 1021 02/09/20 1723 02/10/20 0332  NA 137 135 139  K 4.4 4.7 4.7  CL 102 102 109  CO2 23 22 21*  GLUCOSE 126* 182* 169*  BUN 24* 28* 25*  CREATININE 1.13* 1.21* 0.97  CALCIUM 9.9 9.6 9.1   GFR: Estimated Creatinine Clearance: 42.4 mL/min (by C-G formula based on SCr of 0.97 mg/dL). Liver Function Tests: Recent Labs  Lab 02/07/20 1021 02/09/20 1723  AST 25 55*  ALT 27 58*  ALKPHOS 139* 186*  BILITOT 0.5 0.7  PROT 7.1 6.6  ALBUMIN 3.1* 2.9*   No results for input(s): LIPASE, AMYLASE in the last 168 hours. No results for input(s): AMMONIA in the last 168 hours. Coagulation Profile: Recent Labs  Lab 02/09/20 1723  INR 1.3*   Cardiac Enzymes: No results for input(s): CKTOTAL, CKMB, CKMBINDEX, TROPONINI in the last 168 hours. BNP (last 3 results) No results for input(s): PROBNP in the last 8760 hours. HbA1C: No results for  input(s):  HGBA1C in the last 72 hours. CBG: No results for input(s): GLUCAP in the last 168 hours. Lipid Profile: No results for input(s): CHOL, HDL, LDLCALC, TRIG, CHOLHDL, LDLDIRECT in the last 72 hours. Thyroid Function Tests: No results for input(s): TSH, T4TOTAL, FREET4, T3FREE, THYROIDAB in the last 72 hours. Anemia Panel: No results for input(s): VITAMINB12, FOLATE, FERRITIN, TIBC, IRON, RETICCTPCT in the last 72 hours. Urine analysis:    Component Value Date/Time   COLORURINE YELLOW 02/09/2020 2036   APPEARANCEUR HAZY (A) 02/09/2020 2036   LABSPEC 1.031 (H) 02/09/2020 2036   PHURINE 5.0 02/09/2020 2036   GLUCOSEU NEGATIVE 02/09/2020 2036   HGBUR NEGATIVE 02/09/2020 2036   BILIRUBINUR NEGATIVE 02/09/2020 2036   KETONESUR NEGATIVE 02/09/2020 2036   PROTEINUR 100 (A) 02/09/2020 2036   UROBILINOGEN 0.2 04/29/2013 2243   NITRITE NEGATIVE 02/09/2020 2036   LEUKOCYTESUR LARGE (A) 02/09/2020 2036   Sepsis Labs: @LABRCNTIP (procalcitonin:4,lacticidven:4) )No results found for this or any previous visit (from the past 240 hour(s)).   Radiological Exams on Admission: CT ANGIO CHEST PE W OR WO CONTRAST  Result Date: 02/09/2020 CLINICAL DATA:  Shortness of breath, weakness, left upper lobe lung cancer EXAM: CT ANGIOGRAPHY CHEST WITH CONTRAST TECHNIQUE: Multidetector CT imaging of the chest was performed using the standard protocol during bolus administration of intravenous contrast. Multiplanar CT image reconstructions and MIPs were obtained to evaluate the vascular anatomy. CONTRAST:  33m OMNIPAQUE IOHEXOL 350 MG/ML SOLN COMPARISON:  12/24/2019 FINDINGS: Cardiovascular: This is a technically adequate evaluation of the pulmonary vasculature. There are segmental right lower lobe pulmonary emboli. Clot burden is minimal. No evidence of right heart strain. No pericardial effusion. Thoracic aorta is normal in caliber with no aneurysm or dissection. Mediastinum/Nodes: No pathologic adenopathy.  There is a large hiatal hernia. Esophagus, trachea, and thyroid are stable. Lungs/Pleura: Since the previous exam, there has been significant enlargement of the left pleural effusion now measuring greater than 1 L in volume. The effusion is partially loculated, with continued areas of pleural thickening. There is dense left lower lobe consolidation consistent with atelectasis. There is a trace right pleural effusion. Patchy hypoventilatory changes are seen at the right lung base. No pneumothorax. Central airways patent. Upper Abdomen: Stable right lobe liver cyst. Remainder of the upper abdomen is unremarkable. Musculoskeletal: No acute or destructive bony lesions. Reconstructed images demonstrate no additional findings. Review of the MIP images confirms the above findings. IMPRESSION: 1. Right lower lobe segmental pulmonary emboli. Clot burden is minimal. 2. Significant interval enlargement of the partially loculated left pleural effusion, now measuring greater than 1 L in volume. Continued areas of pleural thickening suspicious for recurrent disease. 3. Trace right pleural effusion. 4. Hiatal hernia. These results were called by telephone at the time of interpretation on 02/09/2020 at 11:40 pm to provider [facility], who verbally acknowledged these results. Electronically Signed   By: MRanda NgoM.D.   On: 02/09/2020 23:40   DG Chest Port 1 View  Result Date: 02/09/2020 CLINICAL DATA:  Short of breath EXAM: PORTABLE CHEST 1 VIEW COMPARISON:  06/24/2018, CT 12/24/2019 FINDINGS: Postoperative changes in the left upper lung. Moderate left pleural effusion increased compared to prior. Probable trace right effusion. Airspace disease at the left base. Obscured cardiomediastinal silhouette. Hiatal hernia. Convex opacity over the lower mediastinal silhouette. IMPRESSION: 1. Postsurgical changes of the left chest. Moderate left pleural effusion, increased compared to prior. Probable trace right pleural  effusion. Airspace disease at the left lung base. Electronically Signed   By:  Donavan Foil M.D.   On: 02/09/2020 18:07   CT RENAL STONE STUDY  Result Date: 02/09/2020 CLINICAL DATA:  Flank pain, kidney stone suspected. EXAM: CT ABDOMEN AND PELVIS WITHOUT CONTRAST TECHNIQUE: Multidetector CT imaging of the abdomen and pelvis was performed following the standard protocol without IV contrast. COMPARISON:  Abdominal CT 12/24/2019. Concurrent chest CTA, reported separately. FINDINGS: Lower chest: Left pleural effusion and basilar consolidation assessed on concurrent chest CT, reported separately. Moderately large hiatal hernia, partially obscured by motion artifact. Hepatobiliary: Cyst in the right lobe of the liver is grossly stable, motion artifact partially obscures evaluation. There is some high-density material within the gallbladder that may represent sludge. More detailed evaluation is limited given motion. No biliary dilatation. Pancreas: Motion artifact through the pancreas limits assessment. No ductal dilatation. No obvious peripancreatic inflammation Spleen: Normal in size with small cleft medially. Adrenals/Urinary Tract: No adrenal nodule. No hydronephrosis. No renal or ureteral calculi. Mild bilateral renal parenchymal thinning and cortical scarring in the lower right kidney. There is symmetric bilateral perinephric edema. Urinary bladder is unremarkable, no bladder stone. Stomach/Bowel: Moderately large hiatal hernia. Motion limits more detailed assessment. No evidence of bowel obstruction. No obvious bowel inflammation, lack of contrast and motion limits detailed bowel assessment. Descending colostomy. Mild descending colonic diverticulosis without diverticulitis chain sutures noted in the cecum. Vascular/Lymphatic: Aorto bi-iliac atherosclerosis. No grossly enlarged abdominopelvic lymph nodes, detailed assessment limited given motion and lack of contrast. Reproductive: Calcified uterine fibroids. No  adnexal mass. Other: Mild generalized body wall edema. No ascites. No free air. Musculoskeletal: Left hip arthroplasty. Bones are under mineralized. Stable bone island in the mid sacrum. Multilevel degenerative change in the lumbar spine. IMPRESSION: 1. Motion limited exam. 2. No renal stones or obstructive uropathy. Symmetric bilateral perinephric edema is nonspecific. 3. Moderately large hiatal hernia. 4. High-density material within the gallbladder may represent sludge. More detailed evaluation is limited given motion artifact. Aortic Atherosclerosis (ICD10-I70.0). Electronically Signed   By: Keith Rake M.D.   On: 02/09/2020 23:30    EKG: Independently reviewed.  Normal sinus rhythm with nonspecific T changes.  Assessment/Plan Principal Problem:   Acute pulmonary embolism (HCC) Active Problems:   Cancer of upper lobe of left lung (HCC)   Hypertension   Malignant neoplasm of ascending colon s/p robotic colectomy 03/06/2016   Adenocarcinoma of lung (HCC)   CKD (chronic kidney disease), stage III   Acute lower UTI   Fever   Pleural effusion, left   Pulmonary embolism ([place])    1. Acute pulmonary embolism with no strain pattern presently placed on heparin infusion.  Will check cardiac markers and 2D echo and Dopplers of the lower extremities.  Likely precipitating cause could be patient's known malignancies. 2. Left pleural effusion loculated increasing in size could be from recurrence of patient's cancer will need thoracentesis. 3. UTI on antibiotics.  Likely causing patient's fever. 4. Hypertension on lisinopril and amlodipine. 5. Chronic kidney disease stage III.  Note that patient is on lisinopril. 6. Anemia likely related to malignancy follow CBC. 7. Non-small cell lung cancer of the lung with history of adenocarcinoma status post colostomy history of breast cancer being followed by oncologist Dr. Julien Nordmann. 8. Severe protein calorie monitoring will need nutrition input.  Given  that patient has acute pulmonary embolism with large pleural effusion which may need thoracentesis with UTI will need close monitoring for any further worsening in inpatient status.   DVT prophylaxis: Heparin infusion. Code Status: Full code. Family Communication: Patient's daughter. Disposition  Plan: Home. Consults called: None. Admission status: Inpatient.   Rise Patience MD [facility] Pager [phone].  If 7PM-7AM, please contact night-coverage [email or web address] Password [facility]  02/10/2020, 5:57 AM

## 2020-02-10 NOTE — ED Notes (Signed)
Pt assisted to [facility], one person assistance. Pt became dyspneic with exertion. Pt transferred to hospital bed.

## 2020-02-10 NOTE — Progress Notes (Signed)
Patient's daughter and husband at bedside. Dr. Carlis Abbott speaking with them at this time after reassessing the patient. Daughter had asked this RN about spending the night with patient and RN reviewed visitation policy with daughter and patient. RN assured daughter that if anything significant changed with patient over night that I would call her and that she is welcome to call and check on the patient anytime overnight.

## 2020-02-10 NOTE — Plan of Care (Signed)
Chart and imaging reviewed. PE, dilated RV on CT with elevated troponin. Enlarging loculated L pleural effusion.   PESI score V  Recommend admission to SD rather than tele.  D/w Dr. Reesa Chew. Depending on echo results, will determine if she is a good candidate for catheter directed lytics.  Full consult note to follow.  Julian Hy, DO 02/10/20 1:52 PM [facility]

## 2020-02-10 NOTE — Progress Notes (Signed)
ANTICOAGULATION CONSULT NOTE - Follow Up Consult  Pharmacy Consult for heparin Indication: acute pulmonary embolus  Allergies  Allergen Reactions  . Doxycycline Nausea Only    Weak, stomach  . Sulfa Antibiotics Rash    Patient Measurements: Height: 5\' 6"  (167.6 cm) Weight: 62.4 kg (137 lb 9.1 oz) IBW/kg (Calculated) : 59.3 Heparin Dosing Weight: 58 kg  Vital Signs: Temp: 97.4 F (36.3 C) (04/22 2000) Temp Source: Oral (04/22 2000) BP: 144/71 (04/22 2000) Pulse Rate: 95 (04/22 2000)  Labs: Recent Labs    02/09/20 1723 02/10/20 0003 02/10/20 0224 02/10/20 0332 02/10/20 1001 02/10/20 1918  HGB 10.6*  --   --  8.5*  --   --   HCT 33.5*  --   --  27.2*  --   --   PLT 165  --   --  123*  --   --   APTT 35  --   --   --   --   --   LABPROT 16.5*  --   --   --   --   --   INR 1.3*  --   --   --   --   --   HEPARINUNFRC  --   --   --   --  0.19* 0.27*  CREATININE 1.21*  --   --  0.97  --   --   TROPONINIHS  --  681* 637*  --   --   --     Estimated Creatinine Clearance: 43.3 mL/min (by C-G formula based on SCr of 0.97 mg/dL).  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  Chest CTA was positive for acute PE and left pleural effusion.  Patient's current on heparin drip for acute PE.  Today, 02/10/2020: - heparin level is almost to the low end of desired goal at 0.27 after rate increased to 1050 units/hr - hgb down 8.5, plts down 123 - troponin elevated - per pt's RN, no bleeding noted   Goal of Therapy:  Heparin level 0.3-0.7 units/ml Monitor platelets by anticoagulation protocol: Yes   Plan:  - increase heparin 1150 units/hr - daily heparin level & CBC - monitor for s/sx bleeding - f/u LE doppler results  Eudelia Bunch, Pharm.D [phone] 02/10/2020 8:11 PM

## 2020-02-10 NOTE — Telephone Encounter (Signed)
Scheduled per los. Called, not able to leave msg. Mailed printout  

## 2020-02-10 NOTE — Progress Notes (Signed)
ANTICOAGULATION CONSULT NOTE - Follow Up Consult  Pharmacy Consult for heparin Indication: acute pulmonary embolus  Allergies  Allergen Reactions  . Doxycycline Nausea Only    Weak, stomach  . Sulfa Antibiotics Rash    Patient Measurements: Height: 5\' 6"  (167.6 cm) Weight: 58 kg (127 lb 13.9 oz) IBW/kg (Calculated) : 59.3 Heparin Dosing Weight: 58 kg  Vital Signs: BP: 127/71 (04/22 0745) Pulse Rate: 94 (04/22 0745)  Labs: Recent Labs    02/07/20 1021 02/09/20 1723 02/10/20 0003 02/10/20 0224 02/10/20 0332  HGB 11.7* 10.6*  --   --  8.5*  HCT 36.3 33.5*  --   --  27.2*  PLT 231 165  --   --  123*  APTT  --  35  --   --   --   LABPROT  --  16.5*  --   --   --   INR  --  1.3*  --   --   --   CREATININE 1.13* 1.21*  --   --  0.97  TROPONINIHS  --   --  681* 637*  --     Estimated Creatinine Clearance: 42.4 mL/min (by C-G formula based on SCr of 0.97 mg/dL).  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  Chest CTA was positive for acute PE and left pleural effusion.  Patient's current on heparin drip for acute PE.  Today, 02/10/2020: - first heparin level is back sub-therapeutic at 0.19 - hgb down 8.5, plts down 123 - troponin elevated - per pt's RN, no bleeding noted   Goal of Therapy:  Heparin level 0.3-0.7 units/ml Monitor platelets by anticoagulation protocol: Yes   Plan:  - increase heparin 1050 units/hr - check 8 hr heparin level - monitor for s/sx bleeding - f/u LE doppler results  Nella Botsford P 02/10/2020,9:56 AM

## 2020-02-10 NOTE — Progress Notes (Signed)
Pt transferred to ICU stepdown, 1231. Report updated to [facility]. Pt daughter waiting in waiting room. SRP,RN

## 2020-02-10 NOTE — Procedures (Signed)
Thoracentesis Procedure Note  Pre-operative Diagnosis: pleural effusion   Post-operative Diagnosis: same  Indications: loculated pleural effusion  Procedure Details  Consent: Informed consent was obtained. Risks of the procedure were discussed including: infection, bleeding, pain, pneumothorax.  Under sterile conditions the patient was positioned. Betadine solution and sterile drapes were utilized.  1% plain lidocaine was used to anesthetize the skin, [place] tissues, and pleura. Fluid was obtained without any difficulties and minimal blood loss. She developed anterior lower chest pain and the procedure was terminated.  A dressing was applied to the wound and wound care instructions were provided.   Findings 950 ml of bloody pleural fluid was obtained. A sample was sent to Pathology for cytogenetics, flow, and cell counts, as well as for infection analysis.  Complications:  Lower anterior chest pain after procedure.          Condition: stable  Plan A follow up chest x-ray was ordered.   Attending Attestation: I performed the procedure.  Julian Hy, DO 02/10/20 6:27 PM [place] [facility]

## 2020-02-10 NOTE — Progress Notes (Signed)
MEDICATION RELATED CONSULT NOTE - INITIAL   Pharmacy Consult for osimertinib mesylate (TAGRISSO) tablet 80 mg Indication: Oral Chemo  Allergies  Allergen Reactions  . Doxycycline Nausea Only    Weak, stomach  . Sulfa Antibiotics Rash    Patient Measurements: Height: 5\' 6"  (167.6 cm) Weight: 58 kg (127 lb 13.9 oz) IBW/kg (Calculated) : 59.3 Adjusted Body Weight:   Vital Signs: Temp: 98.3 F (36.8 C) (04/21 1717) Temp Source: Oral (04/21 1717) BP: 139/66 (04/22 0015) Pulse Rate: 96 (04/22 0015) Intake/Output from previous day: 04/21 0701 - 04/22 0700 In: 250.1 [IV Piggyback:250.1] Out: -  Intake/Output from this shift: Total I/O In: 250.1 [IV Piggyback:250.1] Out: -   Labs: Recent Labs    02/07/20 1021 02/09/20 1723  WBC 11.5* 12.1*  HGB 11.7* 10.6*  HCT 36.3 33.5*  PLT 231 165  APTT  --  35  CREATININE 1.13* 1.21*  ALBUMIN 3.1* 2.9*  PROT 7.1 6.6  AST 25 55*  ALT 27 58*  ALKPHOS 139* 186*  BILITOT 0.5 0.7   Estimated Creatinine Clearance: 34 mL/min (A) (by C-G formula based on SCr of 1.21 mg/dL (H)).   Microbiology: No results found for this or any previous visit (from the past 720 hour(s)).  Medical History: Past Medical History:  Diagnosis Date  . Allergy   . Anemia   . Anemia of chronic disease 09/21/2016  . Anxiety   . Arthritis    arthritis- left hip  . Blood transfusion without reported diagnosis    transfusion- 3-4 yrs ago -found to be anemic on routine lab check  . Breast cancer ([place]) 2002   bilateral- bilateral mastectomies done.  . 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 CVA (cerebral infarction) 04/30/2013  . Encounter for antineoplastic chemotherapy 10/28/2016  . Family history of brain cancer   . Family history of breast cancer   . Family history of colon cancer 10/06/2009  . Family history of colon cancer   . Family history of prostate cancer   . GERD (gastroesophageal reflux disease)   . Glaucoma   . History of bilateral mastectomy  05/20/2010  . Hypertension   . Lung cancer ([place]) dx'd 12/2010   last Ct scan "no lung cancer" showing 11'16 CT Chest Epic.  . Malignant neoplasm of ascending colon ([place]) 01/29/2016  . Stroke [facility])    2 yrs ago-no residual  . Tubular adenoma of colon 07/2011   colon polyps ans reoccurence with malignancy found    Medications:  (Not in a hospital admission)  Scheduled:  . amLODipine  5 mg Oral BID  . brimonidine-timolol  1 drop Right Eye Q12H  . cholecalciferol  2,000 Units Oral Daily  . clopidogrel  75 mg Oral Daily  . cycloSPORINE  1 drop Both Eyes BID  . escitalopram  10 mg Oral Daily  . famotidine  20 mg Oral Daily  . feeding supplement (ENSURE ENLIVE)  237 mL Oral BID BM  . letrozole  2.5 mg Oral Daily  . lisinopril  20 mg Oral Daily  . multivitamin with minerals  1 tablet Oral Daily    Assessment: Patient taking Osimertinib prior to admission as oral chemotherapy.  Patient with active infection.   Osimertinib (Tagrisso) Hold Criteria: . Interstitial lung disease/pneumonitis . QTc interval > 500 msec on at least 2 separate ECGs . QTc interval prolongation with symptoms of life-threatening arrhythmia . Asymptomatic absolute decrease in LVEF of 10% from baseline and below 50% . Symptomatic CHF . Grade 3 or higher  ADE . Active infection   Goal of Therapy:  Safe and effective use of Osimertinib  Plan:  D/c Osimertinib at this time per protocol  Nani Skillern Crowford 02/10/2020,12:28 AM

## 2020-02-10 NOTE — ED Notes (Signed)
Pt provided lunch tray, family at bedside.

## 2020-02-10 NOTE — ED Notes (Signed)
Pt assisted onto and off of the bedpan. Peri care provided and pt repositioned. Pt has call bell within reach. Daughter at bedside.

## 2020-02-10 NOTE — Progress Notes (Signed)
Pt signed consent for thoracentesis after Dr. Carlis Abbott reviewed the risks/benefits of the procedure with pt and family. Witnessed by RN, and placed in chart. All questions were answered at this time by MD. 950ccs of bloody pleural fluid was removed by MD; procedure was stopped when pt c/o new sharp pain below sternal area. Pt was assisted back to bed by RN, and pleural fluid was taken down to lab for cytology and patho. Pt still c/o pain in lower L side and back; 3 doses of 98mcg fentanyl given by RN. Pain is improving slowly according to pt. CXR taken, and repeat will be done in AM. RN will continue to monitor pt.

## 2020-02-10 NOTE — Consult Note (Signed)
NAME:  Kristin [name], MRN:  [id number], DOB:  [birth year], LOS: 0 ADMISSION DATE:  02/09/2020, CONSULTATION DATE:  02/10/2020 REFERRING MD:  Dr. Reesa Chew, CHIEF COMPLAINT:  Fever and shortness of breath   Brief History   79yo female with extensive PMH who presented with complaint of shortness of breath, weakness, weight loss, and fever. Found to have large pleural effusion and PE.   History of present illness   Kristin [name] is a 79 yo female with PMH significant for metastatic non-small cell lung cancer, colon cancer s/p colostomy, breast cancer, hypertension, prior CVA, and anxiety who presented to the ED due to complaints of shortness of breath, weakness, fever, and weight loss. Per patient she was seen by oncologist 3 days prior to admission for follow up where she received IV fluids and empirically started on Macrobid for presumed UTI. She additionally reports left sided back/falnk pain. Patient continued to feel poor with eventually development of fever of 103 which prompted presentation to the ER.   On admission lab works significant for Creatinine 1.21 (baseline 0.9-1.0), elevated alkaline phos of 182, elevated WBC of 12.1, hgb 10.6 elevated BNP of 717.3. Vitals on admission significant for tachypnea and mild tachycardia meeting criteria for SIRS. Workup thus far reveals acute PE with CT evidence of RV strain as well as significant enlargement large left pleural effusion.   PCCM was consulted for assistance in management of pleural effusion and PE Given SIRS criteria with presence of left pleural effusion patient needs diagnostic thoracentesis to rule out empyema.   Past Medical History  Anemia of chronic disease Breast cancer Colon cancer NSCL cancer  CVA Hypertension  Significant Hospital Events   Admitted 4/22  Consults:    Procedures:    Significant Diagnostic Tests:  CTA chest 4/22 >  evidence of RV strain as well as significant enlargement large left pleural effusion.    Micro Data:  Urine culture 4/22 > Blood culture 4/22 > COVID 4/22 > negative  Antimicrobials:  Ceftriaxone 4/22    Interim history/subjective:  Patient is seen lying in bed with tachypnea and continued dyspnea. Daughter is at bedside.   Objective   Blood pressure 110/75, pulse 92, temperature 98.3 F (36.8 C), temperature source Oral, resp. rate (!) 32, height 5' 6"  (1.676 m), weight 58 kg, SpO2 95 %.        Intake/Output Summary (Last 24 hours) at 02/10/2020 1304 Last data filed at 02/10/2020 0946 Gross per 24 hour  Intake 2458.2 ml  Output --  Net 2458.2 ml   Filed Weights   02/09/20 1718 02/09/20 1832  Weight: 58.1 kg 58 kg    Examination: General: Chronically ill appearing elderly female lying in bed in NAD HEENT: [place]/AT, MM pink/moist, PERRL, mildly HOH Neuro: Alert and oriented x3,  CV: s1s2 regular rate and rhythm, no murmur, rubs, or gallops,  PULM:  Diminished breath sounds on left, mild tachypnea, no accessory muscle use seen  GI: soft, bowel sounds active in all 4 quadrants, non-tender, non-distended Extremities: warm/dry, no edema  Skin: no rashes or lesions   Resolved Hospital Problem list     Assessment & Plan:  Acute right lower lobe PE  -Complicated by elevated troponin, elevated BNP, and RV dilation seen on CT  -PESI score V Large loculated left pleural effusion  -Per chart review effusion appears to be chronic but has increased in size with concern for infection SIRS secondary to UTI but need to rule out empyema as well  P: ECHO  to assess for RV strain  If RV strain seen patient may need systemic thrombolytics  Continue systemic heparin  Goal INR  Will need at least diagnostic thora Continue IV antibiotics, may need to broaden  Collect cytology, glucose and cell count  Follow cultures   Best practice:  Diet: Heart healthy  Pain/Anxiety/Delirium protocol (if indicated): PRNs VAP protocol (if indicated): N/A DVT prophylaxis: Heparin  drip GI prophylaxis: PPI Glucose control: Monitor  Mobility: Bedrest  Code Status: Full Family Communication: Daughter updated at bedside  Disposition: ICU   Labs   CBC: Recent Labs  Lab 02/07/20 1021 02/09/20 1723 02/10/20 0332  WBC 11.5* 12.1* 16.7*  NEUTROABS 9.2* 9.6*  --   HGB 11.7* 10.6* 8.5*  HCT 36.3 33.5* 27.2*  MCV 90.5 91.0 91.9  PLT 231 165 123*    Basic Metabolic Panel: Recent Labs  Lab 02/07/20 1021 02/09/20 1723 02/10/20 0332  NA 137 135 139  K 4.4 4.7 4.7  CL 102 102 109  CO2 23 22 21*  GLUCOSE 126* 182* 169*  BUN 24* 28* 25*  CREATININE 1.13* 1.21* 0.97  CALCIUM 9.9 9.6 9.1   GFR: Estimated Creatinine Clearance: 42.4 mL/min (by C-G formula based on SCr of 0.97 mg/dL). Recent Labs  Lab 02/07/20 1021 02/09/20 1723 02/09/20 1733 02/09/20 2010 02/10/20 0332  WBC 11.5* 12.1*  --   --  16.7*  LATICACIDVEN  --   --  1.6 1.0  --     Liver Function Tests: Recent Labs  Lab 02/07/20 1021 02/09/20 1723  AST 25 55*  ALT 27 58*  ALKPHOS 139* 186*  BILITOT 0.5 0.7  PROT 7.1 6.6  ALBUMIN 3.1* 2.9*   No results for input(s): LIPASE, AMYLASE in the last 168 hours. No results for input(s): AMMONIA in the last 168 hours.  ABG    Component Value Date/Time   PHART 7.326 (L) 01/12/2011 0025   PCO2ART 43.6 01/12/2011 0025   PO2ART 86.0 01/12/2011 0025   HCO3 22.9 01/12/2011 0025   TCO2 26 04/29/2013 2136   ACIDBASEDEF 3.0 (H) 01/12/2011 0025   O2SAT 96.0 01/12/2011 0025     Coagulation Profile: Recent Labs  Lab 02/09/20 1723  INR 1.3*    Cardiac Enzymes: No results for input(s): CKTOTAL, CKMB, CKMBINDEX, TROPONINI in the last 168 hours.  HbA1C: Hgb A1c MFr Bld  Date/Time Value Ref Range Status  06/15/2018 03:25 AM 5.2 4.8 - 5.6 % Final    Comment:    (NOTE) Pre diabetes:          5.7%-6.4% Diabetes:              >6.4% Glycemic control for   <7.0% adults with diabetes   02/23/2016 02:30 PM 5.1 4.8 - 5.6 % Final    Comment:     (NOTE)         Pre-diabetes: 5.7 - 6.4         Diabetes: >6.4         Glycemic control for adults with diabetes: <7.0     CBG: No results for input(s): GLUCAP in the last 168 hours.  Review of Systems: Positive in bold   Gen: Denies fever, chills, weight change, fatigue, night sweats HEENT: Denies blurred vision, double vision, hearing loss, tinnitus, sinus congestion, rhinorrhea, sore throat, neck stiffness, dysphagia PULM: Denies shortness of breath, cough, sputum production, hemoptysis, wheezing CV: Denies chest pain, edema, orthopnea, paroxysmal nocturnal dyspnea, palpitations GI: Denies abdominal pain, nausea, vomiting, diarrhea, hematochezia, melena, constipation,  change in bowel habits GU: Denies dysuria, hematuria, polyuria, oliguria, urethral discharge Endocrine: Denies hot or cold intolerance, polyuria, polyphagia or appetite change Derm: Denies rash, dry skin, scaling or peeling skin change Heme: Denies easy bruising, bleeding, bleeding gums Neuro: Denies headache, numbness, weakness, slurred speech, loss of memory or consciousness  Past Medical History  She,  has a past medical history of Allergy, Anemia, Anemia of chronic disease (09/21/2016), Anxiety, Arthritis, Blood transfusion without reported diagnosis, Breast cancer ([place]) (2002), Clinical depression (12/02/2000), CVA (cerebral infarction) (04/30/2013), Encounter for antineoplastic chemotherapy (10/28/2016), Family history of brain cancer, Family history of breast cancer, Family history of colon cancer (10/06/2009), Family history of colon cancer, Family history of prostate cancer, GERD (gastroesophageal reflux disease), Glaucoma, History of bilateral mastectomy (05/20/2010), Hypertension, Lung cancer ([place]) (dx'd 12/2010), Malignant neoplasm of ascending colon ([place]) (01/29/2016), Stroke ([place]), and Tubular adenoma of colon (07/2011).   Surgical History    Past Surgical History:  Procedure Laterality Date  . APPENDECTOMY    .  BOWEL DECOMPRESSION N/A 06/12/2018   Procedure: BOWEL DECOMPRESSION;  Surgeon: Rush Landmark Telford Nab., MD;  Location: [facility];  Service: Gastroenterology;  Laterality: N/A;  . cataracts Bilateral   . COLECTOMY WITH COLOSTOMY CREATION/HARTMANN PROCEDURE  06/16/2018   Procedure: CREATION/HARTMANN COLOSTOMY;  Surgeon: Michael Boston, MD;  Location: [facility];  Service: General;;  . COLONOSCOPY    . COLOSTOMY N/A 06/16/2018   Procedure: POSSIBLE COLOSTOMY;  Surgeon: Michael Boston, MD;  Location: [facility];  Service: General;  Laterality: N/A;  . EYE SURGERY    . FLEXIBLE SIGMOIDOSCOPY N/A 06/12/2018   Procedure: FLEXIBLE SIGMOIDOSCOPY;  Surgeon: Rush Landmark Telford Nab., MD;  Location: [facility];  Service: Gastroenterology;  Laterality: N/A;  . LAPAROSCOPIC SIGMOID COLECTOMY N/A 06/16/2018   Procedure: LAPAROSCOPIC LOW ANTERIOR RESECTION;  Surgeon: Michael Boston, MD;  Location: [facility];  Service: General;  Laterality: N/A;  . LUNG SURGERY     Resection -" not done. Pt . tx with Tarceva  . MASTECTOMY Bilateral   . MEDIASTINOSCOPY N/A 10/02/2016   Procedure: MEDIASTINOSCOPY;  Surgeon: [name], MD;  Location: [facility] OR;  Service: Thoracic;  Laterality: N/A;  . RECONSTRUCTION BREAST W/ TRAM FLAP Bilateral    bilaterally  . robotic right hemicolectomy Right 03/06/2016   Dr. Johney Maine  . TEE WITHOUT CARDIOVERSION N/A 06/04/2013   Procedure: TRANSESOPHAGEAL ECHOCARDIOGRAM (TEE);  Surgeon: Jolaine Artist, MD;  Location: [facility] [place] [facility];  Service: Cardiovascular;  Laterality: N/A;  . TONSILLECTOMY    . TOTAL HIP ARTHROPLASTY Left 08/07/2016   Procedure: LEFT TOTAL HIP ARTHROPLASTY ANTERIOR APPROACH;  Surgeon: Gaynelle Arabian, MD;  Location: [facility];  Service: Orthopedics;  Laterality: Left;     Social History   reports that she has never smoked. She has never used smokeless tobacco. She reports current alcohol use. She reports that she does not use drugs.   Family History   Her family history  includes Brain cancer in her cousin and paternal uncle; Breast cancer (age of onset: 75) in her sister; Cancer in her cousin, maternal uncle, paternal aunt, and paternal uncle; Clotting disorder (age of onset: 62) in her mother; Colon cancer in her cousin and sister; Colon cancer (age of onset: 76) in her maternal aunt; Heart disease in her mother; Lung cancer in her father and maternal uncle; Multiple myeloma in her paternal grandmother; Other in her maternal uncle; Prostate cancer in her maternal uncle; Stomach cancer in her paternal aunt.   Allergies Allergies  Allergen Reactions  .  Doxycycline Nausea Only    Weak, stomach  . Sulfa Antibiotics Rash     Home Medications  Prior to Admission medications   Medication Sig Start Date End Date Taking? Authorizing Provider  amLODipine (NORVASC) 5 MG tablet Take 5 tablets by mouth in the morning and at bedtime.  03/08/19  Yes Eber Hong, MD  brimonidine-timolol (COMBIGAN) 0.2-0.5 % ophthalmic solution Place 1 drop into the right eye every 12 (twelve) hours.   Yes [provider]  Cholecalciferol (VITAMIN D3) 2000 units capsule Take 2,000 Units daily by mouth.   Yes [provider]  clopidogrel (PLAVIX) 75 MG tablet Take 75 mg by mouth daily. 06/25/16  Yes [provider]  cycloSPORINE (RESTASIS) 0.05 % ophthalmic emulsion Place 1 drop into both eyes 2 (two) times daily.   Yes [provider]  escitalopram (LEXAPRO) 10 MG tablet Take 10 mg by mouth daily.  05/20/13  Yes Philmore Pali, NP  famotidine (PEPCID) 40 MG tablet Take 40 mg by mouth daily.   Yes [provider]  FeFum-FePoly-FA-B Cmp-C-Biot (FOLIVANE-PLUS) CAPS TAKE 1 CAPSULE BY MOUTH EVERY DAY IN THE MORNING Patient taking differently: Take 1 capsule by mouth daily.  12/20/19  Yes Curt Bears, MD  letrozole [facility]) 2.5 MG tablet TAKE 1 TABLET BY MOUTH EVERY DAY Patient taking differently: Take 2.5 mg by mouth daily.  03/08/19  Yes Curt Bears,  MD  lisinopril (ZESTRIL) 20 MG tablet Take 20 mg by mouth daily. 02/01/20  Yes [provider]  Multiple Vitamins-Minerals (PRESERVISION AREDS 2) CAPS Take by mouth.   Yes [provider]  nitrofurantoin, macrocrystal-monohydrate, (MACROBID) 100 MG capsule Take 1 capsule (100 mg total) by mouth 2 (two) times daily. 02/07/20  Yes Curt Bears, MD  osimertinib mesylate (TAGRISSO) 80 MG tablet Take 1 tablet (80 mg total) by mouth daily. 01/07/20  Yes Heilingoetter, Cassandra L, PA-C  prochlorperazine (COMPAZINE) 10 MG tablet Take 1 tablet (10 mg total) by mouth every 6 (six) hours as needed for nausea or vomiting. 02/07/20  Yes Curt Bears, MD  feeding supplement, ENSURE ENLIVE, (ENSURE ENLIVE) LIQD Take 237 mLs by mouth 2 (two) times daily between meals. Patient not taking: Reported on 02/07/2020 06/25/18   Margie Billet A, PA-C  Multiple Vitamins-Iron (MULTIVITAMINS WITH IRON) TABS tablet Take 1 tablet by mouth daily. Patient not taking: Reported on 02/07/2020 06/25/18   Wellington Hampshire, PA-C     Signature:   Johnsie Cancel, NP-C [facility] Contact / Pager information can be found on Amion  02/10/2020, 6:08 PM

## 2020-02-11 ENCOUNTER — Inpatient Hospital Stay (HOSPITAL_COMMUNITY): Payer: Medicare Other

## 2020-02-11 ENCOUNTER — Other Ambulatory Visit: Payer: Self-pay

## 2020-02-11 DIAGNOSIS — C3491 Malignant neoplasm of unspecified part of right bronchus or lung: Secondary | ICD-10-CM | POA: Diagnosis not present

## 2020-02-11 DIAGNOSIS — N390 Urinary tract infection, site not specified: Secondary | ICD-10-CM | POA: Diagnosis not present

## 2020-02-11 DIAGNOSIS — I2699 Other pulmonary embolism without acute cor pulmonale: Secondary | ICD-10-CM

## 2020-02-11 DIAGNOSIS — I480 Paroxysmal atrial fibrillation: Secondary | ICD-10-CM | POA: Diagnosis not present

## 2020-02-11 DIAGNOSIS — I10 Essential (primary) hypertension: Secondary | ICD-10-CM | POA: Diagnosis not present

## 2020-02-11 DIAGNOSIS — J9601 Acute respiratory failure with hypoxia: Secondary | ICD-10-CM

## 2020-02-11 DIAGNOSIS — N1832 Chronic kidney disease, stage 3b: Secondary | ICD-10-CM | POA: Diagnosis not present

## 2020-02-11 DIAGNOSIS — R7989 Other specified abnormal findings of blood chemistry: Secondary | ICD-10-CM

## 2020-02-11 DIAGNOSIS — I4891 Unspecified atrial fibrillation: Secondary | ICD-10-CM

## 2020-02-11 DIAGNOSIS — J9600 Acute respiratory failure, unspecified whether with hypoxia or hypercapnia: Secondary | ICD-10-CM | POA: Diagnosis present

## 2020-02-11 DIAGNOSIS — C3492 Malignant neoplasm of unspecified part of left bronchus or lung: Secondary | ICD-10-CM | POA: Diagnosis not present

## 2020-02-11 LAB — BASIC METABOLIC PANEL
Anion gap: 12 (ref 5–15)
Anion gap: 11 (ref 5–15)
Anion gap: 9 (ref 5–15)
BUN: 30 mg/dL — ABNORMAL HIGH (ref 8–23)
BUN: 33 mg/dL — ABNORMAL HIGH (ref 8–23)
BUN: 35 mg/dL — ABNORMAL HIGH (ref 8–23)
CO2: 18 mmol/L — ABNORMAL LOW (ref 22–32)
CO2: 20 mmol/L — ABNORMAL LOW (ref 22–32)
CO2: 21 mmol/L — ABNORMAL LOW (ref 22–32)
Calcium: 9.3 mg/dL (ref 8.9–10.3)
Calcium: 9.5 mg/dL (ref 8.9–10.3)
Calcium: 9.6 mg/dL (ref 8.9–10.3)
Chloride: 105 mmol/L (ref 98–111)
Chloride: 104 mmol/L (ref 98–111)
Chloride: 106 mmol/L (ref 98–111)
Creatinine, Ser: 1.03 mg/dL — ABNORMAL HIGH (ref 0.44–1.00)
Creatinine, Ser: 0.98 mg/dL (ref 0.44–1.00)
Creatinine, Ser: 0.99 mg/dL (ref 0.44–1.00)
GFR calc Af Amer: 59 mL/min — ABNORMAL LOW (ref >60)
GFR calc Af Amer: >60 mL/min (ref >60)
GFR calc Af Amer: >60 mL/min (ref >60)
GFR calc non Af Amer: 51 mL/min — ABNORMAL LOW (ref >60)
GFR calc non Af Amer: 54 mL/min — ABNORMAL LOW (ref >60)
GFR calc non Af Amer: 54 mL/min — ABNORMAL LOW (ref >60)
Glucose, Bld: 158 mg/dL — ABNORMAL HIGH (ref 70–99)
Glucose, Bld: 143 mg/dL — ABNORMAL HIGH (ref 70–99)
Glucose, Bld: 144 mg/dL — ABNORMAL HIGH (ref 70–99)
Potassium: 4.8 mmol/L (ref 3.5–5.1)
Potassium: 4.6 mmol/L (ref 3.5–5.1)
Potassium: 4.6 mmol/L (ref 3.5–5.1)
Sodium: 135 mmol/L (ref 135–145)
Sodium: 135 mmol/L (ref 135–145)
Sodium: 136 mmol/L (ref 135–145)

## 2020-02-11 LAB — CBC
HCT: 26 % — ABNORMAL LOW (ref 36.0–46.0)
Hemoglobin: 8.3 g/dL — ABNORMAL LOW (ref 12.0–15.0)
MCH: 29.2 pg (ref 26.0–34.0)
MCHC: 31.9 g/dL (ref 30.0–36.0)
MCV: 91.5 fL (ref 80.0–100.0)
Platelets: 160 10*3/uL (ref 150–400)
RBC: 2.84 MIL/uL — ABNORMAL LOW (ref 3.87–5.11)
RDW: 13.2 % (ref 11.5–15.5)
WBC: 12.1 10*3/uL — ABNORMAL HIGH (ref 4.0–10.5)
nRBC: 0 % (ref 0.0–0.2)

## 2020-02-11 LAB — HEPATIC FUNCTION PANEL
ALT: 69 U/L — ABNORMAL HIGH (ref 0–44)
ALT: 71 U/L — ABNORMAL HIGH (ref 0–44)
AST: 67 U/L — ABNORMAL HIGH (ref 15–41)
AST: 69 U/L — ABNORMAL HIGH (ref 15–41)
Albumin: 3.1 g/dL — ABNORMAL LOW (ref 3.5–5.0)
Albumin: 3.2 g/dL — ABNORMAL LOW (ref 3.5–5.0)
Alkaline Phosphatase: 133 U/L — ABNORMAL HIGH (ref 38–126)
Alkaline Phosphatase: 138 U/L — ABNORMAL HIGH (ref 38–126)
Bilirubin, Direct: 0.2 mg/dL (ref 0.0–0.2)
Bilirubin, Direct: 0.2 mg/dL (ref 0.0–0.2)
Indirect Bilirubin: 0.3 mg/dL (ref 0.3–0.9)
Indirect Bilirubin: 0.4 mg/dL (ref 0.3–0.9)
Total Bilirubin: 0.5 mg/dL (ref 0.3–1.2)
Total Bilirubin: 0.6 mg/dL (ref 0.3–1.2)
Total Protein: 5.9 g/dL — ABNORMAL LOW (ref 6.5–8.1)
Total Protein: 6 g/dL — ABNORMAL LOW (ref 6.5–8.1)

## 2020-02-11 LAB — MAGNESIUM: Magnesium: 2.1 mg/dL (ref 1.7–2.4)

## 2020-02-11 LAB — HEPARIN LEVEL (UNFRACTIONATED)
Heparin Unfractionated: 0.23 IU/mL — ABNORMAL LOW (ref 0.30–0.70)
Heparin Unfractionated: 0.29 IU/mL — ABNORMAL LOW (ref 0.30–0.70)

## 2020-02-11 LAB — T4, FREE: Free T4: 1.49 ng/dL — ABNORMAL HIGH (ref 0.61–1.12)

## 2020-02-11 LAB — TSH
TSH: 0.015 u[IU]/mL — ABNORMAL LOW (ref 0.350–4.500)
TSH: 0.016 u[IU]/mL — ABNORMAL LOW (ref 0.350–4.500)

## 2020-02-11 LAB — HEMOGLOBIN AND HEMATOCRIT, BLOOD
HCT: 31.1 % — ABNORMAL LOW (ref 36.0–46.0)
Hemoglobin: 9.8 g/dL — ABNORMAL LOW (ref 12.0–15.0)

## 2020-02-11 LAB — URINE CULTURE: Culture: 5000 — AB

## 2020-02-11 LAB — LACTATE DEHYDROGENASE: LDH: 143 U/L (ref 98–192)

## 2020-02-11 MED ORDER — DILTIAZEM HCL 50 MG/10ML IV SOLN
5.0000 mg | Freq: Once | INTRAVENOUS | Status: AC
  Administered 2020-02-11: 5 mg via INTRAVENOUS
  Filled 2020-02-11: qty 1

## 2020-02-11 MED ORDER — BOOST / RESOURCE BREEZE PO LIQD CUSTOM
1.0000 | Freq: Two times a day (BID) | ORAL | Status: DC
  Administered 2020-02-13 – 2020-02-17 (×3): 1 via ORAL

## 2020-02-11 MED ORDER — DILTIAZEM HCL 25 MG/5ML IV SOLN
5.0000 mg | Freq: Once | INTRAVENOUS | Status: DC
  Filled 2020-02-11: qty 5

## 2020-02-11 MED ORDER — DILTIAZEM HCL-DEXTROSE 125-5 MG/125ML-% IV SOLN (PREMIX): 5.0000 mg/h | INTRAVENOUS | Status: DC

## 2020-02-11 MED ORDER — RIVAROXABAN 15 MG PO TABS
15.0000 mg | ORAL_TABLET | Freq: Two times a day (BID) | ORAL | Status: DC
  Administered 2020-02-11 – 2020-02-19 (×16): 15 mg via ORAL
  Filled 2020-02-11 (×16): qty 1

## 2020-02-11 MED ORDER — NON FORMULARY: 80.0000 mg | Freq: Every day | Status: DC

## 2020-02-11 MED ORDER — DILTIAZEM LOAD VIA INFUSION
5.0000 mg | Freq: Once | INTRAVENOUS | Status: DC
  Filled 2020-02-11: qty 5

## 2020-02-11 MED ORDER — PSYLLIUM 95 % PO PACK
1.0000 | PACK | Freq: Every day | ORAL | Status: DC
  Administered 2020-02-11 – 2020-02-13 (×2): 1 via ORAL
  Filled 2020-02-11 (×9): qty 1

## 2020-02-11 MED ORDER — ALBUMIN HUMAN 25 % IV SOLN
25.0000 g | Freq: Once | INTRAVENOUS | Status: AC
  Administered 2020-02-11: 25 g via INTRAVENOUS

## 2020-02-11 MED ORDER — DILTIAZEM HCL 50 MG/10ML IV SOLN
10.0000 mg | Freq: Once | INTRAVENOUS | Status: DC
  Filled 2020-02-11: qty 2

## 2020-02-11 MED ORDER — DILTIAZEM HCL-DEXTROSE 125-5 MG/125ML-% IV SOLN (PREMIX)
5.0000 mg/h | INTRAVENOUS | Status: DC
  Administered 2020-02-11: 5 mg/h via INTRAVENOUS
  Filled 2020-02-11 (×2): qty 125

## 2020-02-11 MED ORDER — AMLODIPINE BESYLATE 5 MG PO TABS
5.0000 mg | ORAL_TABLET | Freq: Every day | ORAL | Status: DC
  Administered 2020-02-12 – 2020-02-17 (×5): 5 mg via ORAL
  Filled 2020-02-11 (×6): qty 1

## 2020-02-11 MED ORDER — RIVAROXABAN 20 MG PO TABS: 20.0000 mg | ORAL_TABLET | Freq: Every day | ORAL | Status: DC

## 2020-02-11 MED ORDER — AMIODARONE HCL IN DEXTROSE 360-4.14 MG/200ML-% IV SOLN
30.0000 mg/h | INTRAVENOUS | Status: DC
  Administered 2020-02-11 – 2020-02-13 (×4): 30 mg/h via INTRAVENOUS
  Filled 2020-02-11 (×4): qty 200

## 2020-02-11 MED ORDER — AMIODARONE HCL IN DEXTROSE 360-4.14 MG/200ML-% IV SOLN
INTRAVENOUS | Status: AC
  Administered 2020-02-11: 60 mg/h via INTRAVENOUS
  Filled 2020-02-11: qty 200

## 2020-02-11 MED ORDER — AMIODARONE IV BOLUS ONLY 150 MG/100ML: 150.0000 mg | Freq: Once | INTRAVENOUS | Status: AC

## 2020-02-11 MED ORDER — OSIMERTINIB MESYLATE 80 MG PO TABS
80.0000 mg | ORAL_TABLET | Freq: Every day | ORAL | Status: DC
  Administered 2020-02-12 – 2020-02-19 (×8): 80 mg via ORAL

## 2020-02-11 MED ORDER — AMIODARONE IV BOLUS ONLY 150 MG/100ML
INTRAVENOUS | Status: AC
  Administered 2020-02-11: 150 mg via INTRAVENOUS
  Filled 2020-02-11: qty 100

## 2020-02-11 MED ORDER — AMIODARONE HCL IN DEXTROSE 360-4.14 MG/200ML-% IV SOLN
60.0000 mg/h | INTRAVENOUS | Status: AC
  Administered 2020-02-11: 60 mg/h via INTRAVENOUS
  Filled 2020-02-11: qty 200

## 2020-02-11 MED ORDER — ALBUMIN HUMAN 5 % IV SOLN
25.0000 g | Freq: Once | INTRAVENOUS | Status: AC
  Administered 2020-02-11: 25 g via INTRAVENOUS
  Filled 2020-02-11: qty 500

## 2020-02-11 NOTE — Progress Notes (Signed)
Called Kristin [name] and spoke with [facility], RN since Dr. Lucile Shutters is on the phone. Asked [place], RN to relay to Dr. Lucile Shutters that patient's blood pressure had dropped into 90's after he ordered the 10 mg IV push of cardizem but now is back up to 118/60. RN wanting to know if cardizem should be given.

## 2020-02-11 NOTE — Progress Notes (Signed)
Lower venous duplex       has been completed. Preliminary results can be found under CV proc through chart review. June Leap, BS, RDMS, RVT

## 2020-02-11 NOTE — Progress Notes (Signed)
[facility] Progress Note Patient Name: Mishka Stegemann DOB: 07-20-1941 MRN: [id number]   Date of Service  02/11/2020  HPI/Events of Note  Elevated blood pressure.  eICU Interventions  Amlodipine 5 mg po daily restarted (home medication)        Frederik Pear 02/11/2020, 11:18 PM

## 2020-02-11 NOTE — Progress Notes (Signed)
Called Kristin [name] and spoke with Solmon Ice, RN and asked her to make Dr. Lucile Shutters aware that patient is in SVT. Patient A&Ox4, denies pain. Denies shortness of breath. Patient calm. RN asked patient to bare down a couple times with no result.

## 2020-02-11 NOTE — Progress Notes (Signed)
eLink Physician-Brief Progress Note Patient Name: Kristin [name] DOB: 11-Sep-1941 MRN: [id number]   Date of Service  02/11/2020  HPI/Events of Note  Pt with persistent Afib RVR despite Amiodarone and intermittent Cardizem boluses  eICU Interventions  Cardizem infusion ordered.        Kerry Kass Amilio Zehnder 02/11/2020, 4:23 AM

## 2020-02-11 NOTE — Progress Notes (Signed)
Initial Nutrition Assessment  DOCUMENTATION CODES:   Non-severe (moderate) malnutrition in context of chronic illness  INTERVENTION:  Boost Breeze po BID, each supplement provides 250 kcal and 9 grams of protein (berry) Magic cup BID with lunch and dinner meals, each supplement provides 290 kcal and 9 grams of protein (chocolate) CIB with whole milk on breakfast tray, each supplement provides 280 kcal, and 13 grams of protien (chocolate) Education for increasing calories/protein Consider appetite stimulant  NUTRITION DIAGNOSIS:   Moderate Malnutrition related to chronic illness as evidenced by moderate fat depletion, moderate muscle depletion, severe muscle depletion.  GOAL:   Patient will meet greater than or equal to 90% of their needs   MONITOR:   PO intake, Supplement acceptance, Weight trends, I & O's, Labs  REASON FOR ASSESSMENT:   Malnutrition Screening Tool, Consult Assessment of nutrition requirement/status  ASSESSMENT:  79 year old female with past medical history of metastatic non-small cell lung cancer with h/o colon cancer s/p colostomy and h/o breast cancer, HTN, CVA, presented with increasing SOB and weakness over the past few day, and weight loss. CT angiogram revealed right lower lobe pulmonary embolism with small clot burden and enlarging left pleural effusion concerning for recurrence of cancer.  Patient resting this afternoon, easily awakened by calling her name, daughter at bedside. Patient reports feeling very tired, daughter reports that patient had a "rough night" and did not sleep very well. Patient endorses poor intake of lunch today, recalls having a couple of bites and did not eat breakfast. Patient falling asleep during conversation, the remaining nutrition history obtained from daughter.  Daughter reports that patient appetite and intake have significantly declined over the past few weeks, she does drink Ensure but does not tolerate them well. RD  suggested trying CIB with whole fat milk and offered Boost Breeze as another supplement option during admission.   RD educated on small frequent meals throughout the day verses 3 large meals and ways to add calories and protein to meals/snacks/supplements.   Current wt 137.28 lbs Per history, weight on admission was 127.6 lbs and weights have been stable 133-137 lbs over the past 3 months. Suspect current weight is r/t fluid status. Will use admit wt 58 kg for estimating needs. Daughter reports recent wt losses, recalls maintaining 133 lbs and weighed 128 lbs at her oncology appointment last week.Per daughter report patient has lost 5 lbs (3.8%) in the past 7 days which is significant.  Medications reviewed and include: D3, Restasis, Lexapro, Femara IVF: NaCl Nexterone  Cardizem Heparin Labs: BNP 717 (H)  NUTRITION - FOCUSED PHYSICAL EXAM: Moderate fat depletion to orbital and buccal regions; Moderate muscle depletion to temple region; Severe muscle depletion to clavicle, clavicle and acromion, and dorsal hand regions   Diet Order:   Diet Order            Diet Heart Room service appropriate? Yes; Fluid consistency: Thin  Diet effective now              EDUCATION NEEDS:   Education needs have been addressed  Skin:  Skin Assessment: Reviewed RN Assessment  Last BM:  4/22  Height:   Ht Readings from Last 1 Encounters:  02/10/20 5\' 6"  (1.676 m)    Weight:   Wt Readings from Last 1 Encounters:  02/10/20 62.4 kg    Ideal Body Weight:     BMI:  Body mass index is 22.2 kg/m.  Estimated Nutritional Needs:   Kcal:  1950-2200  Protein:  90-110  Fluid:  >/= 1.9 L/day   Lajuan Lines, RD, LDN Clinical Nutrition After Hours/Weekend Pager # in [phone]

## 2020-02-11 NOTE — Progress Notes (Signed)
eLink Physician-Brief Progress Note Patient Name: Kristin [name] DOB: 09-20-1941 MRN: [id number]   Date of Service  02/11/2020  HPI/Events of Note  Pt with persistent Afib with RVR despite Amiodarone infusion  and 2 doses of Cardizem 5 mg iv.  eICU Interventions  Will order Cardizem 10 mg iv x 1 and if RVR persists after that will begin a Diltiazem infusion.        Kerry Kass Devarion Mcclanahan 02/11/2020, 3:39 AM

## 2020-02-11 NOTE — Progress Notes (Signed)
Patient's daughter Kristin [name] called and RN updated her on overnight events including afib and new IV medications being used to help control patient's heart rate and rhythm. Informed her that patient has not had any respiratory distress and has denied pain. All questions answered and daughter spoke with patient on the phone during this phone call. Daughter planning to come visit this morning.

## 2020-02-11 NOTE — Progress Notes (Addendum)
Spoke with Dr. Lucile Shutters and discussed blood pressure getting lower therefore MD discontinued Cardizem and ordered amio drip. Patient remains A&Ox4 without distress. No signs of bleeding. Denies pain. MD stated that he was considering calling daughter to update her on patient's heart rhythm change.

## 2020-02-11 NOTE — Progress Notes (Signed)
Patient unable to spontaneously void for 14 hours. Patient has no urge or feeling of full bladder. Bladder scans completed X3 with > 300 each time. Attempted to run water and mobilize patient to encourage urination with no success. MD made aware. Patient states she hadn't had urge for a bit and felt she wasn't emptying her bladder.  Plan to place foley for retention.

## 2020-02-11 NOTE — Progress Notes (Signed)
[facility] Progress Note Patient Name: Razan Siler DOB: 12/19/1940 MRN: [id number]   Date of Service  02/11/2020  HPI/Events of Note  Pt with Afib with RVR confirmed by 12 lead EKG, she's hemodynamically stable and saturating 99 % on 6 liters O2 via nasal cannula. Thoracentesis site without stigmata of bleeding.  eICU Interventions  Amiodarone 150 mg iv bolus now, stat H & H, BMET, magnesium. Albumin 25 % 25 gm iv x 1.        Kerry Kass Glover Capano 02/11/2020, 12:21 AM

## 2020-02-11 NOTE — Progress Notes (Signed)
Patient did not void overnight.  Patient attempted to void this morning when asked to by RN but could not.  Bladder scanned and resulted 304 ml.  Rested in between care overnight. No signs of bleeding. Heart rate better controlled with addition of Cardizem drip. Denies pain. No shortness of breath overnight.

## 2020-02-11 NOTE — Progress Notes (Signed)
[facility], RN from [place] called back and stated Dr. Lucile Shutters ordered cardizem drip to be started. Clarified with MD and he wants both amiodarone and cardizem drips infusing.

## 2020-02-11 NOTE — Progress Notes (Addendum)
HEMATOLOGY-ONCOLOGY PROGRESS NOTE  SUBJECTIVE: Kristin [name] presented to the emergency room with fever and shortness of breath.  Temperature was 101 on admission.  In the ER, her creatinine was 1.2, hemoglobin 10.3, and WBC 10.2.  Her AST was 55, ALT was 58 and albumin was 2.9.  She had a CT angiogram of the chest which showed segmental right lower lobe pulmonary embolism with small clot burden and enlarging left pleural effusion which is loculated.  She was started on heparin for her pulmonary emboli.  She was seen by PCCM and had a left thoracentesis on 02/10/2020.  950 cc of bloody fluid was removed and a sample was sent to pathology.  Cytology pending.  Her urinalysis is consistent with UTI and she was started on empiric antibiotics.  When seen today, she reports ongoing shortness of breath.  She had a fair amount of pain following the thoracentesis last evening.  The patient developed A. fib with RVR overnight.  She was initially started on amiodarone for her atrial fibrillation persisted and she was also started on diltiazem.  She has no chest pain this morning.  Denies abdominal pain, nausea, vomiting.  REVIEW OF SYSTEMS:   Constitutional: Reports generalized weakness, fevers on admission Respiratory: Reports shortness of breath Cardiovascular: Denies palpitation, chest discomfort Gastrointestinal:  Denies nausea, heartburn or change in bowel habits Skin: Denies abnormal skin rashes Lymphatics: Denies new lymphadenopathy or easy bruising Neurological:Denies numbness, tingling or new weaknesses Behavioral/Psych: Mood is stable, no new changes  Extremities: No lower extremity edema All other systems were reviewed with the patient and are negative.  I have reviewed the past medical history, past surgical history, social history and family history with the patient and they are unchanged from previous note.   PHYSICAL EXAMINATION: ECOG PERFORMANCE STATUS: 2 - Symptomatic, <50% confined to  bed  Vitals:   02/11/20 1000 02/11/20 1015  BP: 118/72   Pulse:    Resp: (!) 39 (!) 40  Temp:    SpO2:     Filed Weights   02/09/20 1718 02/09/20 1832 02/10/20 1529  Weight: 58.1 kg 58 kg 62.4 kg    Intake/Output from previous day: 04/22 0701 - 04/23 0700 In: 1324.2 [P.O.:90; I.V.:1124.8; IV Piggyback:109.4] Out: 200 [Urine:200]  GENERAL: Chronically ill-appearing female, appears tachypneic SKIN: skin color, texture, turgor are normal, no rashes or significant lesions EYES: normal, Conjunctiva are pink and non-injected, sclera clear OROPHARYNX:no exudate, no erythema and lips, buccal mucosa, and tongue normal  NECK: supple, thyroid normal size, non-tender, without nodularity LYMPH:  no palpable lymphadenopathy in the cervical, axillary or inguinal LUNGS: Diminished left base otherwise clear HEART: Tachycardic, no lower extremity edema ABDOMEN:abdomen soft, non-tender and normal bowel sounds Musculoskeletal:no cyanosis of digits and no clubbing  NEURO: alert & oriented x 3 with fluent speech, no focal motor/sensory deficits  LABORATORY DATA:  I have reviewed the data as listed CMP Latest Ref Rng & Units 02/11/2020 02/11/2020 02/11/2020  Glucose 70 - 99 mg/dL 143(H) 144(H) 158(H)  BUN 8 - 23 mg/dL 33(H) 35(H) 30(H)  Creatinine 0.44 - 1.00 mg/dL 0.98 0.99 1.03(H)  Sodium 135 - 145 mmol/L 135 136 135  Potassium 3.5 - 5.1 mmol/L 4.6 4.6 4.8  Chloride 98 - 111 mmol/L 104 106 105  CO2 22 - 32 mmol/L 20(L) 21(L) 18(L)  Calcium 8.9 - 10.3 mg/dL 9.5 9.6 9.3  Total Protein 6.5 - 8.1 g/dL 6.0(L) 5.9(L) -  Total Bilirubin 0.3 - 1.2 mg/dL 0.6 0.5 -  Alkaline Phos 38 -  126 U/L 138(H) 133(H) -  AST 15 - 41 U/L 69(H) 67(H) -  ALT 0 - 44 U/L 71(H) 69(H) -    Lab Results  Component Value Date   WBC 12.1 (H) 02/11/2020   HGB 8.3 (L) 02/11/2020   HCT 26.0 (L) 02/11/2020   MCV 91.5 02/11/2020   PLT 160 02/11/2020   NEUTROABS 9.6 (H) 02/09/2020    CT ANGIO CHEST PE W OR WO  CONTRAST  Result Date: 02/09/2020 CLINICAL DATA:  Shortness of breath, weakness, left upper lobe lung cancer EXAM: CT ANGIOGRAPHY CHEST WITH CONTRAST TECHNIQUE: Multidetector CT imaging of the chest was performed using the standard protocol during bolus administration of intravenous contrast. Multiplanar CT image reconstructions and MIPs were obtained to evaluate the vascular anatomy. CONTRAST:  78m OMNIPAQUE IOHEXOL 350 MG/ML SOLN COMPARISON:  12/24/2019 FINDINGS: Cardiovascular: This is a technically adequate evaluation of the pulmonary vasculature. There are segmental right lower lobe pulmonary emboli. Clot burden is minimal. No evidence of right heart strain. No pericardial effusion. Thoracic aorta is normal in caliber with no aneurysm or dissection. Mediastinum/Nodes: No pathologic adenopathy. There is a large hiatal hernia. Esophagus, trachea, and thyroid are stable. Lungs/Pleura: Since the previous exam, there has been significant enlargement of the left pleural effusion now measuring greater than 1 L in volume. The effusion is partially loculated, with continued areas of pleural thickening. There is dense left lower lobe consolidation consistent with atelectasis. There is a trace right pleural effusion. Patchy hypoventilatory changes are seen at the right lung base. No pneumothorax. Central airways patent. Upper Abdomen: Stable right lobe liver cyst. Remainder of the upper abdomen is unremarkable. Musculoskeletal: No acute or destructive bony lesions. Reconstructed images demonstrate no additional findings. Review of the MIP images confirms the above findings. IMPRESSION: 1. Right lower lobe segmental pulmonary emboli. Clot burden is minimal. 2. Significant interval enlargement of the partially loculated left pleural effusion, now measuring greater than 1 L in volume. Continued areas of pleural thickening suspicious for recurrent disease. 3. Trace right pleural effusion. 4. Hiatal hernia. These results  were called by telephone at the time of interpretation on 02/09/2020 at 11:40 pm [facility] verbally acknowledged these results. Electronically Signed   By: MRanda NgoM.D.   On: 02/09/2020 23:40   DG Chest Port 1 View  Result Date: 02/11/2020 CLINICAL DATA:  Respiratory failure EXAM: PORTABLE CHEST 1 VIEW COMPARISON:  02/10/2020 FINDINGS: Cardiomegaly with vascular congestion. Small left pleural effusion. Left lower lobe atelectasis. Bilateral interstitial prominence may reflect mild interstitial edema. IMPRESSION: Cardiomegaly, suspect mild interstitial edema. Small left effusion with left base atelectasis. Electronically Signed   By: KRolm BaptiseM.D.   On: 02/11/2020 02:22   DG CHEST PORT 1 VIEW  Result Date: 02/10/2020 CLINICAL DATA:  Post left thoracentesis EXAM: PORTABLE CHEST 1 VIEW COMPARISON:  02/09/2020 FINDINGS: Interval decrease in the left effusion following thoracentesis. Small left effusion remains. No pneumothorax. Left base atelectasis. Mild cardiomegaly. Right lung clear. IMPRESSION: Decreasing left effusion following thoracentesis.  No pneumothorax. Small left effusion and left base atelectasis. Electronically Signed   By: KRolm BaptiseM.D.   On: 02/10/2020 19:13   DG Chest Port 1 View  Result Date: 02/09/2020 CLINICAL DATA:  Short of breath EXAM: PORTABLE CHEST 1 VIEW COMPARISON:  06/24/2018, CT 12/24/2019 FINDINGS: Postoperative changes in the left upper lung. Moderate left pleural effusion increased compared to prior. Probable trace right effusion. Airspace disease at the left base. Obscured cardiomediastinal silhouette.  Hiatal hernia. Convex opacity over the lower mediastinal silhouette. IMPRESSION: 1. Postsurgical changes of the left chest. Moderate left pleural effusion, increased compared to prior. Probable trace right pleural effusion. Airspace disease at the left lung base. Electronically Signed   By: Donavan Foil M.D.   On: 02/09/2020 18:07    ECHOCARDIOGRAM COMPLETE  Result Date: 02/10/2020    ECHOCARDIOGRAM REPORT   Patient Name:   Kristin [name] Date of Exam: 02/10/2020 Medical Rec #:  [id number]       Height:       66.0 in Accession #:    [id number]      Weight:       127.9 lb Date of Birth:  10-02-1941       BSA:          1.654 m Patient Age:    42 years        BP:           111/58 mmHg Patient Gender: F               HR:           88 bpm. Exam Location:  Inpatient Procedure: 2D Echo and Intracardiac Opacification Agent Indications:    Pulmonary Embolus 415.19 / I26.99  History:        Patient has prior history of Echocardiogram examinations, most                 recent 02/01/2016. Risk Factors:Hypertension. CKD                 Pleural effusion.  Sonographer:    Vikki Ports Turrentine Referring Phys: [id number] Rise Patience  Sonographer Comments: Technically difficult study due to poor echo windows. Image acquisition challenging due to patient body habitus. IMPRESSIONS  1. Left ventricular ejection fraction, by estimation, is 50 to 55%. The left ventricle has low normal function. The left ventricle has no regional wall motion abnormalities. Left ventricular diastolic parameters are consistent with Grade I diastolic dysfunction (impaired relaxation).  2. Right ventricular systolic function is moderately reduced. The right ventricular size is moderately enlarged. D-shaped septum suggestive of RV pressure/volume overload. No complete TR doppler jet so unable to estimate PA systolic pressure.  3. The aortic valve is tricuspid. Aortic valve regurgitation is trivial. No aortic stenosis is present.  4. The mitral valve is normal in structure. No evidence of mitral valve regurgitation. No evidence of mitral stenosis.  5. The inferior vena cava is dilated in size with <50% respiratory variability, suggesting right atrial pressure of 15 mmHg.  6. Left-sided pleural effusion noted. Trivial pericardial effusion. FINDINGS  Left Ventricle: Left ventricular  ejection fraction, by estimation, is 50 to 55%. The left ventricle has low normal function. The left ventricle has no regional wall motion abnormalities. Definity contrast agent was given IV to delineate the left ventricular endocardial borders. The left ventricular internal cavity size was normal in size. There is no left ventricular hypertrophy. Left ventricular diastolic parameters are consistent with Grade I diastolic dysfunction (impaired relaxation). Right Ventricle: The right ventricular size is moderately enlarged. No increase in right ventricular wall thickness. Right ventricular systolic function is moderately reduced. Tricuspid regurgitation signal is inadequate for assessing PA pressure. Left Atrium: Left atrial size was normal in size. Right Atrium: Right atrial size was normal in size. Pericardium: Trivial pericardial effusion is present. Mitral Valve: The mitral valve is normal in structure. No evidence of mitral valve regurgitation. No evidence of mitral valve stenosis. Tricuspid  Valve: The tricuspid valve is normal in structure. Tricuspid valve regurgitation is not demonstrated. Aortic Valve: The aortic valve is tricuspid. Aortic valve regurgitation is trivial. No aortic stenosis is present. Pulmonic Valve: The pulmonic valve was normal in structure. Pulmonic valve regurgitation is not visualized. Aorta: The aortic root is normal in size and structure. Venous: The inferior vena cava is dilated in size with less than 50% respiratory variability, suggesting right atrial pressure of 15 mmHg. IAS/Shunts: No atrial level shunt detected by color flow Doppler.  LEFT VENTRICLE PLAX 2D LVOT diam:     1.80 cm  Diastology LV SV:         36       LV e' lateral:   9.36 cm/s LV SV Index:   22       LV E/e' lateral: 3.6 LVOT Area:     2.54 cm LV e' medial:    7.83 cm/s                         LV E/e' medial:  4.3  LEFT ATRIUM           Index LA Vol (A4C): 36.2 ml 21.89 ml/m  AORTIC VALVE LVOT Vmax:   84.90 cm/s  LVOT Vmean:  61.100 cm/s LVOT VTI:    0.141 m MITRAL VALVE MV Area (PHT): 3.37 cm    SHUNTS MV Decel Time: 225 msec    Systemic VTI:  0.14 m MV E velocity: 33.40 cm/s  Systemic Diam: 1.80 cm MV A velocity: 59.70 cm/s MV E/A ratio:  0.56 Loralie Champagne MD Electronically signed by Loralie Champagne MD Signature Date/Time: 02/10/2020/4:49:30 PM    Final    CT RENAL STONE STUDY  Result Date: 02/09/2020 CLINICAL DATA:  Flank pain, kidney stone suspected. EXAM: CT ABDOMEN AND PELVIS WITHOUT CONTRAST TECHNIQUE: Multidetector CT imaging of the abdomen and pelvis was performed following the standard protocol without IV contrast. COMPARISON:  Abdominal CT 12/24/2019. Concurrent chest CTA, reported separately. FINDINGS: Lower chest: Left pleural effusion and basilar consolidation assessed on concurrent chest CT, reported separately. Moderately large hiatal hernia, partially obscured by motion artifact. Hepatobiliary: Cyst in the right lobe of the liver is grossly stable, motion artifact partially obscures evaluation. There is some high-density material within the gallbladder that may represent sludge. More detailed evaluation is limited given motion. No biliary dilatation. Pancreas: Motion artifact through the pancreas limits assessment. No ductal dilatation. No obvious peripancreatic inflammation Spleen: Normal in size with small cleft medially. Adrenals/Urinary Tract: No adrenal nodule. No hydronephrosis. No renal or ureteral calculi. Mild bilateral renal parenchymal thinning and cortical scarring in the lower right kidney. There is symmetric bilateral perinephric edema. Urinary bladder is unremarkable, no bladder stone. Stomach/Bowel: Moderately large hiatal hernia. Motion limits more detailed assessment. No evidence of bowel obstruction. No obvious bowel inflammation, lack of contrast and motion limits detailed bowel assessment. Descending colostomy. Mild descending colonic diverticulosis without diverticulitis chain  sutures noted in the cecum. Vascular/Lymphatic: Aorto bi-iliac atherosclerosis. No grossly enlarged abdominopelvic lymph nodes, detailed assessment limited given motion and lack of contrast. Reproductive: Calcified uterine fibroids. No adnexal mass. Other: Mild generalized body wall edema. No ascites. No free air. Musculoskeletal: Left hip arthroplasty. Bones are under mineralized. Stable bone island in the mid sacrum. Multilevel degenerative change in the lumbar spine. IMPRESSION: 1. Motion limited exam. 2. No renal stones or obstructive uropathy. Symmetric bilateral perinephric edema is nonspecific. 3. Moderately large hiatal hernia. 4. High-density material within  the gallbladder may represent sludge. More detailed evaluation is limited given motion artifact. Aortic Atherosclerosis (ICD10-I70.0). Electronically Signed   By: Keith Rake M.D.   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  The patient is currently receiving Tagrisso 80 mg daily and is status post 26 months of treatment.  She has been tolerating her treatment well but recently started having increasing fatigue and weakness as well as nausea, anorexia, and dehydration.  Her last CT scan in March 2021 showed some evidence concerning for disease progression.  She was seen in our office earlier this week and was recommended for her to have a PET scan as well as an MRI of the brain to further evaluate her disease.  These have not yet been performed.  She is now admitted due to pulmonary emboli, pleural effusion, and UTI.  Her course has been complicated by development of A. fib with RVR.  For her metastatic lung cancer, recommend that she  resume Tagrisso 80 mg daily.  This is a nonformulary medication and I have advised her daughter to bring in her home supply and provide to nursing so that we can administer this medication here.  I have placed an order for this medication.  Once her respiratory status is stable, recommend obtaining the MRI of the brain with and without contrast to evaluate for metastatic disease.  The PET scan will need to be done as an outpatient.  Is currently scheduled for next Wednesday and we we will likely need to reschedule this depending on her hospital course.  We will also follow-up on the cytology from her thoracentesis.  For her new pulmonary emboli, recommend transitioning her to Xarelto for anticoagulation.  For her history of breast cancer, continue letrozole.  For her weight loss and malnutrition, I have placed a dietitian consult.    LOS: 1 day   Mikey Bussing, DNP, AGPCNP-BC, AOCNP 02/11/20   ADDENDUM: Hematology/Oncology Attending: The patient is seen and examined today.  I recommended her care plan.  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  The patient started treatment with Tagrisso 80 mg p.o. daily for 26 months and has been tolerating this treatment well.  She was seen recently in the office complaining of increasing fatigue and weakness.  Urinalysis in the office showed evidence for urinary tract infection and the patient started on antibiotics but she has no improvement in her condition.  She was admitted to the hospital with worsening shortness of breath as well as generalized fatigue and weakness and failure to thrive.  CT angiogram of the chest showed evidence for pulmonary embolism in addition to large left pleural effusion.  The  patient underwent thoracentesis by pulmonary medicine with drainage of 950 mL of pleural fluid.  The cytology is still pending. The patient is feeling a little bit better today. I recommended for her to have MRI of the brain performed during this admission.  She is also scheduled to have a PET scan next week for further evaluation  of her disease and to rule out disease progression. We will continue treatment for the urinary tract infection during her hospitalization in addition to IV hydration with fluids. For the pulmonary embolism, we are in agreement with starting the patient on Xarelto for outpatient treatment. Her daughter and husband were at the bedside and they are in agreement with the current plan. Thank you so much for taking good care of Ms. Giusto, I will continue to follow up the patient with you and assist in her management on as-needed basis.  Disclaimer: This note was dictated with voice recognition software. Similar sounding words can inadvertently be transcribed and may be missed upon review. Eilleen Kempf, MD

## 2020-02-11 NOTE — Progress Notes (Signed)
Dr. Lucile Shutters cameraed in room and assessing patient. Patient remains calm with no shortness of breath. Obtaining EKG at this time. BP stable. No bleeding noted from anywhere including thoracentesis site.

## 2020-02-11 NOTE — Progress Notes (Addendum)
Marland Kitchen  PROGRESS NOTE    Kristin [name]  XIP:[id number] DOB: [birth year] DOA: 02/09/2020 PCP: Eber Hong, MD   Brief Narrative:   Kristin [name] is a 79 y.o. female with history of metastatic non-small cell lung cancer with history of colon cancer status post colostomy and history of breast cancer with history of hypertension CVA has been experiencing increasing shortness of breath and weakness over the last few days with increasing weight loss had followed up with her oncologist 3 days ago at that time patient was placed on fluids and empirically started on Macrobid.  Despite which patient is still having increasing weakness and the last 2 days had fevers noted at home with temperature 101 F.  Which prompted patient to come to the ER.  4/23: Requiring amio and dilt gtt for a fib rvr. Given her echo findings and her new afib, have consulted cardiology. E coli growing in her UCx. Give her another day of rocephin and then change her to amoxil in the AM. S/p thoracentesis. Awaiting cytology/Cx on pleural fluid.    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     - echo: EF 50 - 55%, G1DD; RVF moderately reduced w/ D-shaped septum suggestive of RV pressure/volume overload.     - CTA chest: 1. Right lower lobe segmental pulmonary emboli. Clot burden is minimal. 2. Significant interval enlargement of the partially loculated left pleural effusion, now measuring greater than 1 L in volume. Continued areas  of pleural thickening suspicious for recurrent disease.     - currently on heparin gtt     - wouldn't think this is causing her echo findings, spoke with IR.      - LE doppler prelim: Findings consistent with acute deep vein thrombosis involving the left posterior tibial veins, and left peroneal veins.  Left pleural effusion     - loculated increasing in size could be from recurrence of patient's cancer     - s/p thoracentesis w/ pulm; 950cc removed; awaiting cytology and Cx  a fib RVR     - no prior hx     - currently on amio and dilt gtt     - currently on heparin gtt     - rates down to 110's     - question if secondary to clot, but CTA shows minimal burden.     - give the echo finding and her ongoing a fib, have consulted cardiology  E coli UTI     - UCx growing some e coli that is sensitive to rocephin; currently on rocephin  Hypertension     - lisinopril and amlodipine     - BP ok, SCr ok; follow  Chronic kidney disease stage III     - SCr is ok, pt on lisinopril, monitor  Normocytic anemia      - likely related to malignancy follow CBC.     - denies hemetemesis, hematuria; has  colostomy and no blood noted there; follow  Non-small cell lung cancer of the lung with history of adenocarcinoma status post colostomy history of breast cancer being followed by oncologist Dr. Julien Nordmann.  Severe protein calorie     - dietician consult  Elevated LFTs     - AST/ALT/alk phos slightly up     - CT renal study shows GB w/ sludge     - check hep panel, follow labs   Urinary retention     - have asked nursing to place foley  Hx of breast CA Hx of metastatic lung CA     - followed by onco   DVT prophylaxis: Heparin Code Status: FULL Family Communication: With dtr at bedside   Status is: Inpatient  Remains inpatient appropriate because:IV treatments appropriate due to intensity of illness or inability to take PO   Dispo: The patient is from: Home              Anticipated d/c  is to: Home              Anticipated d/c date is: 3 days              Patient currently is not medically stable to d/c.  Consultants:   Pulmonology  Cardiology  Procedures:   Thoracentesis  Antimicrobials:   Rocephin   ROS:  Denies CP, N, V, ab pain, dyspnea . Remainder 10-pt ROS is negative for all not previously mentioned.  Subjective: "It was a little rough."  Objective: Vitals:   02/11/20 0930 02/11/20 0945 02/11/20 1000 02/11/20 1015  BP: (!) 115/58  118/72   Pulse:      Resp: (!) 43 (!) 34 (!) 39 (!) 40  Temp:      TempSrc:      SpO2:      Weight:      Height:        Intake/Output Summary (Last 24 hours) at 02/11/2020 1033 Last data filed at 02/11/2020 0715 Gross per 24 hour  Intake 1230.51 ml  Output 200 ml  Net 1030.51 ml   Filed Weights   02/09/20 1718 02/09/20 1832 02/10/20 1529  Weight: 58.1 kg 58 kg 62.4 kg    Examination:  General: 79 y.o. female resting in bed in NAD Cardiovascular: tachy irregular, +S1, S2, no m/g/r Respiratory: decreased at bases, no w/r/r, normal WOB GI: BS+, NDNT, no masses noted, no organomegaly noted, colostomy noted MSK: No e/c/c Neuro: alert to name, follows commands Psyc: Appropriate interaction and affect, calm/cooperative   Data Reviewed: I have personally reviewed following labs and imaging studies.  CBC: Recent Labs  Lab 02/07/20 1021 02/09/20 1723 02/10/20 0332 02/11/20 0043 02/11/20 0511  WBC 11.5* 12.1* 16.7*  --  12.1*  NEUTROABS 9.2* 9.6*  --   --   --   HGB 11.7* 10.6* 8.5* 9.8* 8.3*  HCT 36.3 33.5* 27.2* 31.1* 26.0*  MCV 90.5 91.0 91.9  --  91.5  PLT 231 165 123*  --  161   Basic Metabolic Panel: Recent Labs  Lab 02/07/20 1021 02/09/20 1723 02/10/20 0332 02/11/20 0043 02/11/20 0511  NA 137 135 139 135 135   136  K 4.4 4.7 4.7 4.8 4.6   4.6  CL 102 102 109 105 104   106  CO2 23 22 21* 18* 20*   21*  GLUCOSE 126* 182* 169* 158* 143*   144*  BUN 24* 28* 25* 30* 33*   35*    CREATININE 1.13*  1.21* 0.97 1.03* 0.98   0.99  CALCIUM 9.9 9.6 9.1 9.3 9.5   9.6  MG  --   --   --  2.1  --    GFR: Estimated Creatinine Clearance: 42.4 mL/min (by C-G formula based on SCr of 0.99 mg/dL). Liver Function Tests: Recent Labs  Lab 02/07/20 1021 02/09/20 1723 02/11/20 0511  AST 25 55* 69*   67*  ALT 27 58* 71*   69*  ALKPHOS 139* 186* 138*   133*  BILITOT 0.5 0.7 0.6   0.5  PROT 7.1 6.6 6.0*   5.9*  ALBUMIN 3.1* 2.9* 3.2*   3.1*   No results for input(s): LIPASE, AMYLASE in the last 168 hours. No results for input(s): AMMONIA in the last 168 hours. Coagulation Profile: Recent Labs  Lab 02/09/20 1723  INR 1.3*   Cardiac Enzymes: No results for input(s): CKTOTAL, CKMB, CKMBINDEX, TROPONINI in the last 168 hours. BNP (last 3 results) No results for input(s): PROBNP in the last 8760 hours. HbA1C: No results for input(s): HGBA1C in the last 72 hours. CBG: No results for input(s): GLUCAP in the last 168 hours. Lipid Profile: No results for input(s): CHOL, HDL, LDLCALC, TRIG, CHOLHDL, LDLDIRECT in the last 72 hours. Thyroid Function Tests: Recent Labs    02/11/20 0511  TSH 0.015*   0.016*  FREET4 1.49*   Anemia Panel: No results for input(s): VITAMINB12, FOLATE, FERRITIN, TIBC, IRON, RETICCTPCT in the last 72 hours. Sepsis Labs: Recent Labs  Lab 02/09/20 1733 02/09/20 2010  LATICACIDVEN 1.6 1.0    Recent Results (from the past 240 hour(s))  Blood Culture (routine x 2)     Status: None (Preliminary result)   Collection Time: 02/09/20  5:33 PM   Specimen: BLOOD RIGHT FOREARM  Result Value Ref Range Status   Specimen Description   Final    BLOOD RIGHT FOREARM Performed at [facility] [address], [place], [place] [address]    Special Requests   Final    BOTTLES DRAWN AEROBIC AND ANAEROBIC Blood Culture results may not be optimal due to an inadequate volume of blood received in culture bottles Performed at [facility] [address] [place], [place] [address]    Culture   Final    NO GROWTH < 12 HOURS Performed at [facility] [address], [place], [place] [address]    Report Status PENDING  Incomplete  Blood Culture (routine x 2)     Status: None (Preliminary result)   Collection Time: 02/09/20  5:33 PM   Specimen: BLOOD  Result Value Ref Range Status   Specimen Description   Final    BLOOD RIGHT ANTECUBITAL Performed at [facility] [address], [place], [place] [address]    Special Requests   Final    BOTTLES DRAWN AEROBIC AND ANAEROBIC Blood Culture results may not be optimal due to an inadequate volume of blood received in culture bottles Performed at [facility] [address], [place], [place] [address]    Culture   Final    NO GROWTH < 12 HOURS Performed at [facility] [address], [place], [place] [address]    Report Status PENDING  Incomplete  Urine culture     Status: Abnormal   Collection Time: 02/09/20  8:36 PM   Specimen: In/Out Cath Urine  Result Value Ref Range Status   Specimen Description   Final  IN/OUT CATH URINE Performed at [facility], [place] [address], [place], [place] [address]    Special Requests   Final    NONE Performed at [facility], [place] [address] [place], [place] [address]    Culture 5,000 COLONIES/mL ESCHERICHIA COLI (A)  Final   Report Status 02/11/2020 FINAL  Final   Organism ID, Bacteria ESCHERICHIA COLI (A)  Final      Susceptibility   Escherichia coli - MIC*    AMPICILLIN 8 SENSITIVE Sensitive     CEFAZOLIN <=4 SENSITIVE Sensitive     CEFTRIAXONE <=1 SENSITIVE Sensitive     CIPROFLOXACIN >=4 RESISTANT Resistant     GENTAMICIN >=16 RESISTANT Resistant     IMIPENEM <=0.25 SENSITIVE Sensitive     NITROFURANTOIN <=16 SENSITIVE Sensitive     TRIMETH/SULFA <=20 SENSITIVE Sensitive     AMPICILLIN/SULBACTAM 4 SENSITIVE  Sensitive     PIP/TAZO <=4 SENSITIVE Sensitive     * 5,000 COLONIES/mL ESCHERICHIA COLI  SARS CORONAVIRUS 2 (TAT 6-24 HRS) Nasopharyngeal Nasopharyngeal Swab     Status: None   Collection Time: 02/10/20  3:12 AM   Specimen: Nasopharyngeal Swab  Result Value Ref Range Status   SARS Coronavirus 2 NEGATIVE NEGATIVE Final    Comment: (NOTE) SARS-CoV-2 target nucleic acids are NOT DETECTED. The SARS-CoV-2 RNA is generally detectable in upper and lower respiratory specimens during the acute phase of infection. Negative results do not preclude SARS-CoV-2 infection, do not rule out co-infections with other pathogens, and should not be used as the sole basis for treatment or other patient management decisions. Negative results must be combined with clinical observations, patient history, and epidemiological information. The expected result is Negative. Fact Sheet for Patients: SugarRoll.be Fact Sheet for Healthcare Providers: https://www.woods-mathews.com/ This test is not yet approved or cleared by the [place] FDA and  has been authorized for detection and/or diagnosis of SARS-CoV-2 by FDA under an Emergency Use Authorization (EUA). This EUA will remain  in effect (meaning this test can be used) for the duration of the COVID-19 declaration under Section 56 4(b)(1) of the Act, 21 U.S.C. section 360bbb-3(b)(1), unless the authorization is terminated or revoked sooner. Performed at [facility], [place] [address] [place], [place] [address]   MRSA PCR Screening     Status: None   Collection Time: 02/10/20  3:32 PM   Specimen: Nasal Mucosa; Nasopharyngeal  Result Value Ref Range Status   MRSA by PCR NEGATIVE NEGATIVE Final    Comment:        The GeneXpert MRSA Assay (FDA approved for NASAL specimens only), is one component of a comprehensive MRSA colonization surveillance program. It is not intended to diagnose MRSA infection nor to  guide or monitor treatment for MRSA infections. Performed at [facility], [place] [address] [place], [place] [address]   Body fluid culture (includes gram stain)     Status: None (Preliminary result)   Collection Time: 02/10/20  6:23 PM   Specimen: Pleural Fluid  Result Value Ref Range Status   Specimen Description   Final    PLEURAL LEFT Performed at [facility] [address] [place], [place] [address]    Special Requests   Final    Normal Performed at [facility], [place] [address], [place], [place] [address]    Gram Stain   Final    FEW WBC PRESENT, PREDOMINANTLY MONONUCLEAR NO ORGANISMS SEEN    Culture   Final  NO GROWTH < 12 HOURS Performed at [facility] [address] [place], [place] [address]    Report Status PENDING  Incomplete      Radiology Studies: CT ANGIO CHEST PE W OR WO CONTRAST  Result Date: 02/09/2020 CLINICAL DATA:  Shortness of breath, weakness, left upper lobe lung cancer EXAM: CT ANGIOGRAPHY CHEST WITH CONTRAST TECHNIQUE: Multidetector CT imaging of the chest was performed using the standard protocol during bolus administration of intravenous contrast. Multiplanar CT image reconstructions and MIPs were obtained to evaluate the vascular anatomy. CONTRAST:  54m OMNIPAQUE IOHEXOL 350 MG/ML SOLN COMPARISON:  12/24/2019 FINDINGS: Cardiovascular: This is a technically adequate evaluation of the pulmonary vasculature. There are segmental right lower lobe pulmonary emboli. Clot burden is minimal. No evidence of right heart strain. No pericardial effusion. Thoracic aorta is normal in caliber with no aneurysm or dissection. Mediastinum/Nodes: No pathologic adenopathy. There is a large hiatal hernia. Esophagus, trachea, and thyroid are stable. Lungs/Pleura: Since the previous exam, there has been significant enlargement of the left pleural effusion now measuring greater than 1 L in volume. The effusion  is partially loculated, with continued areas of pleural thickening. There is dense left lower lobe consolidation consistent with atelectasis. There is a trace right pleural effusion. Patchy hypoventilatory changes are seen at the right lung base. No pneumothorax. Central airways patent. Upper Abdomen: Stable right lobe liver cyst. Remainder of the upper abdomen is unremarkable. Musculoskeletal: No acute or destructive bony lesions. Reconstructed images demonstrate no additional findings. Review of the MIP images confirms the above findings. IMPRESSION: 1. Right lower lobe segmental pulmonary emboli. Clot burden is minimal. 2. Significant interval enlargement of the partially loculated left pleural effusion, now measuring greater than 1 L in volume. Continued areas of pleural thickening suspicious for recurrent disease. 3. Trace right pleural effusion. 4. Hiatal hernia. These results were called by telephone at the time of interpretation on 02/09/2020 at 11:40 pm to provider [facility], who verbally acknowledged these results. Electronically Signed   By: MRanda NgoM.D.   On: 02/09/2020 23:40   DG Chest Port 1 View  Result Date: 02/11/2020 CLINICAL DATA:  Respiratory failure EXAM: PORTABLE CHEST 1 VIEW COMPARISON:  02/10/2020 FINDINGS: Cardiomegaly with vascular congestion. Small left pleural effusion. Left lower lobe atelectasis. Bilateral interstitial prominence may reflect mild interstitial edema. IMPRESSION: Cardiomegaly, suspect mild interstitial edema. Small left effusion with left base atelectasis. Electronically Signed   By: KRolm BaptiseM.D.   On: 02/11/2020 02:22   DG CHEST PORT 1 VIEW  Result Date: 02/10/2020 CLINICAL DATA:  Post left thoracentesis EXAM: PORTABLE CHEST 1 VIEW COMPARISON:  02/09/2020 FINDINGS: Interval decrease in the left effusion following thoracentesis. Small left effusion remains. No pneumothorax. Left base atelectasis. Mild cardiomegaly. Right lung clear. IMPRESSION:  Decreasing left effusion following thoracentesis.  No pneumothorax. Small left effusion and left base atelectasis. Electronically Signed   By: KRolm BaptiseM.D.   On: 02/10/2020 19:13   DG Chest Port 1 View  Result Date: 02/09/2020 CLINICAL DATA:  Short of breath EXAM: PORTABLE CHEST 1 VIEW COMPARISON:  06/24/2018, CT 12/24/2019 FINDINGS: Postoperative changes in the left upper lung. Moderate left pleural effusion increased compared to prior. Probable trace right effusion. Airspace disease at the left base. Obscured cardiomediastinal silhouette. Hiatal hernia. Convex opacity over the lower mediastinal silhouette. IMPRESSION: 1. Postsurgical changes of the left chest. Moderate left pleural effusion, increased compared to prior. Probable trace right pleural effusion. Airspace disease at the left lung  base. Electronically Signed   By: Donavan Foil M.D.   On: 02/09/2020 18:07   ECHOCARDIOGRAM COMPLETE  Result Date: 02/10/2020    ECHOCARDIOGRAM REPORT   Patient Name:   DEZARAY SHIBUYA Date of Exam: 02/10/2020 Medical Rec #:  [id number]       Height:       66.0 in Accession #:    [id number]      Weight:       127.9 lb Date of Birth:  February 10, 1941       BSA:          1.654 m Patient Age:    22 years        BP:           111/58 mmHg Patient Gender: F               HR:           88 bpm. Exam Location:  Inpatient Procedure: 2D Echo and Intracardiac Opacification Agent Indications:    Pulmonary Embolus 415.19 / I26.99  History:        Patient has prior history of Echocardiogram examinations, most                 recent 02/01/2016. Risk Factors:Hypertension. CKD                 Pleural effusion.  Sonographer:    Vikki Ports Turrentine Referring Phys: 30 Rise Patience  Sonographer Comments: Technically difficult study due to poor echo windows. Image acquisition challenging due to patient body habitus. IMPRESSIONS  1. Left ventricular ejection fraction, by estimation, is 50 to 55%. The left ventricle has low normal  function. The left ventricle has no regional wall motion abnormalities. Left ventricular diastolic parameters are consistent with Grade I diastolic dysfunction (impaired relaxation).  2. Right ventricular systolic function is moderately reduced. The right ventricular size is moderately enlarged. D-shaped septum suggestive of RV pressure/volume overload. No complete TR doppler jet so unable to estimate PA systolic pressure.  3. The aortic valve is tricuspid. Aortic valve regurgitation is trivial. No aortic stenosis is present.  4. The mitral valve is normal in structure. No evidence of mitral valve regurgitation. No evidence of mitral stenosis.  5. The inferior vena cava is dilated in size with <50% respiratory variability, suggesting right atrial pressure of 15 mmHg.  6. Left-sided pleural effusion noted. Trivial pericardial effusion. FINDINGS  Left Ventricle: Left ventricular ejection fraction, by estimation, is 50 to 55%. The left ventricle has low normal function. The left ventricle has no regional wall motion abnormalities. Definity contrast agent was given IV to delineate the left ventricular endocardial borders. The left ventricular internal cavity size was normal in size. There is no left ventricular hypertrophy. Left ventricular diastolic parameters are consistent with Grade I diastolic dysfunction (impaired relaxation). Right Ventricle: The right ventricular size is moderately enlarged. No increase in right ventricular wall thickness. Right ventricular systolic function is moderately reduced. Tricuspid regurgitation signal is inadequate for assessing PA pressure. Left Atrium: Left atrial size was normal in size. Right Atrium: Right atrial size was normal in size. Pericardium: Trivial pericardial effusion is present. Mitral Valve: The mitral valve is normal in structure. No evidence of mitral valve regurgitation. No evidence of mitral valve stenosis. Tricuspid Valve: The tricuspid valve is normal in  structure. Tricuspid valve regurgitation is not demonstrated. Aortic Valve: The aortic valve is tricuspid. Aortic valve regurgitation is trivial. No aortic stenosis is present. Pulmonic Valve: The pulmonic valve  was normal in structure. Pulmonic valve regurgitation is not visualized. Aorta: The aortic root is normal in size and structure. Venous: The inferior vena cava is dilated in size with less than 50% respiratory variability, suggesting right atrial pressure of 15 mmHg. IAS/Shunts: No atrial level shunt detected by color flow Doppler.  LEFT VENTRICLE PLAX 2D LVOT diam:     1.80 cm  Diastology LV SV:         36       LV e' lateral:   9.36 cm/s LV SV Index:   22       LV E/e' lateral: 3.6 LVOT Area:     2.54 cm LV e' medial:    7.83 cm/s                         LV E/e' medial:  4.3  LEFT ATRIUM           Index LA Vol (A4C): 36.2 ml 21.89 ml/m  AORTIC VALVE LVOT Vmax:   84.90 cm/s LVOT Vmean:  61.100 cm/s LVOT VTI:    0.141 m MITRAL VALVE MV Area (PHT): 3.37 cm    SHUNTS MV Decel Time: 225 msec    Systemic VTI:  0.14 m MV E velocity: 33.40 cm/s  Systemic Diam: 1.80 cm MV A velocity: 59.70 cm/s MV E/A ratio:  0.56 Loralie Champagne MD Electronically signed by Loralie Champagne MD Signature Date/Time: 02/10/2020/4:49:30 PM    Final    CT RENAL STONE STUDY  Result Date: 02/09/2020 CLINICAL DATA:  Flank pain, kidney stone suspected. EXAM: CT ABDOMEN AND PELVIS WITHOUT CONTRAST TECHNIQUE: Multidetector CT imaging of the abdomen and pelvis was performed following the standard protocol without IV contrast. COMPARISON:  Abdominal CT 12/24/2019. Concurrent chest CTA, reported separately. FINDINGS: Lower chest: Left pleural effusion and basilar consolidation assessed on concurrent chest CT, reported separately. Moderately large hiatal hernia, partially obscured by motion artifact. Hepatobiliary: Cyst in the right lobe of the liver is grossly stable, motion artifact partially obscures evaluation. There is some high-density  material within the gallbladder that may represent sludge. More detailed evaluation is limited given motion. No biliary dilatation. Pancreas: Motion artifact through the pancreas limits assessment. No ductal dilatation. No obvious peripancreatic inflammation Spleen: Normal in size with small cleft medially. Adrenals/Urinary Tract: No adrenal nodule. No hydronephrosis. No renal or ureteral calculi. Mild bilateral renal parenchymal thinning and cortical scarring in the lower right kidney. There is symmetric bilateral perinephric edema. Urinary bladder is unremarkable, no bladder stone. Stomach/Bowel: Moderately large hiatal hernia. Motion limits more detailed assessment. No evidence of bowel obstruction. No obvious bowel inflammation, lack of contrast and motion limits detailed bowel assessment. Descending colostomy. Mild descending colonic diverticulosis without diverticulitis chain sutures noted in the cecum. Vascular/Lymphatic: Aorto bi-iliac atherosclerosis. No grossly enlarged abdominopelvic lymph nodes, detailed assessment limited given motion and lack of contrast. Reproductive: Calcified uterine fibroids. No adnexal mass. Other: Mild generalized body wall edema. No ascites. No free air. Musculoskeletal: Left hip arthroplasty. Bones are under mineralized. Stable bone island in the mid sacrum. Multilevel degenerative change in the lumbar spine. IMPRESSION: 1. Motion limited exam. 2. No renal stones or obstructive uropathy. Symmetric bilateral perinephric edema is nonspecific. 3. Moderately large hiatal hernia. 4. High-density material within the gallbladder may represent sludge. More detailed evaluation is limited given motion artifact. Aortic Atherosclerosis (ICD10-I70.0). Electronically Signed   By: Keith Rake M.D.   On: 02/09/2020 23:30     Scheduled Meds:  brimonidine  1 drop Right Eye BID   And   timolol  1 drop Right Eye BID   Chlorhexidine Gluconate Cloth  6 each Topical Daily    cholecalciferol  2,000 Units Oral Daily   clopidogrel  75 mg Oral Daily   cycloSPORINE  1 drop Both Eyes BID   diltiazem  10 mg Intravenous Once   escitalopram  10 mg Oral Daily   famotidine  20 mg Oral Daily   feeding supplement (ENSURE ENLIVE)  237 mL Oral BID BM   letrozole  2.5 mg Oral Daily   lisinopril  20 mg Oral Daily   mouth rinse  15 mL Mouth Rinse BID   multivitamin with minerals  1 tablet Oral Daily   pantoprazole  40 mg Oral QAC breakfast   Continuous Infusions:  sodium chloride Stopped (02/11/20 0020)   amiodarone 30 mg/hr (02/11/20 0727)   cefTRIAXone (ROCEPHIN)  IV Stopped (02/10/20 2222)   diltiazem (CARDIZEM) infusion 12.5 mg/hr (02/11/20 0715)   heparin 1,250 Units/hr (02/11/20 0715)     LOS: 1 day    Time spent: 35 minutes spent in the coordination of care today.    Jonnie Finner, DO [facility]  If 7PM-7AM, please contact night-coverage [email or web address] 02/11/2020, 10:33 AM

## 2020-02-11 NOTE — Consult Note (Addendum)
Cardiology Consultation:   Patient ID: Kristin [name] MRN: [id number]; DOB: [birth year]  Admit date: 02/09/2020 Date of Consult: 02/11/2020  Primary Care Provider: Eber Hong, MD Primary Cardiologist: Kate Sable, MD  Primary Electrophysiologist:  None    Patient Profile:   Kristin [name] is a 79 y.o. 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 Marylyn Ishihara.  History of Present Illness:   Ms. Redford has been seen by Dr. Bronson Ing in the past. He was last seen 09/21/20 for preoperative risk stratification for reverse ostomy. Echo from 2017 was reviewed showing EF 60-65%, mild basal septal hypertrophy, and G1DD. No h/o of CAD. No further testing was pursued prior to surgery.   The patient was admitted 02/10/20 with shortness of breath and weakness for the last few days. Daughter provided most of the history. States over the last couple weeks shortness of breath had been gradually worsening. It became so severe the patient could only take a couple steps before having to catch her breath. Prior to this she was relatively functional but more recently had not been able to do much due to sob. She was using a walker for the last couple days. She had recent weight loss being follows by her oncologist. Patient saw oncologist 3 days prior and had given IVF empirically and Macrobid for suspected UTI. Patient continued to have fevers up to 101 F and patient came to the ER.   In the ER patient was afebrile; Labs showed WBC 10.2, Hgb 10.3. Lactic acid was normal. Creatinine 1.2, alk phos 182,AST 55, ALT 58, albumin 2.9. BNP 717. EKG showed NSR with nonspecific T wave abnormality and early repolarization. CTA chest showed PE in  the right lower lobe with small clot burden, RV strain, and left sidedpleural effusion concerning for recurrence of patient's cancer. CT renal study unremarkable. UA showed UTI. Patient was stated on IV heparin and abx and admitted for further work-up. PCCM saw patient and underwent left thoracentesis 02/10/20 yielding 950cc of bloody fluid. Overnight patient developed Afib RVR started on IV cardizem and IV amiodarone, cardiology was consulted   Past Medical History:  Diagnosis Date   Allergy    Anemia    Anemia of chronic disease 09/21/2016   Anxiety    Arthritis    arthritis- left hip   Blood transfusion without reported diagnosis    transfusion- 3-4 yrs ago -found to be anemic on routine lab check   Breast cancer ([place]) 2002   bilateral- bilateral mastectomies done.   Clinical depression 12/02/2000   CVA (cerebral infarction) 04/30/2013   Encounter for antineoplastic chemotherapy 10/28/2016   Family history of brain cancer    Family history of breast cancer    Family history of colon cancer 10/06/2009   Family history of colon cancer    Family history of prostate cancer    GERD (gastroesophageal reflux disease)    Glaucoma    History of bilateral mastectomy 05/20/2010   Hypertension    Lung cancer ([place]) dx'd 12/2010   last Ct scan "no lung cancer" showing 11'16 CT Chest Epic.   Malignant neoplasm of ascending colon ([place]) 01/29/2016   Stroke ([place])    2 yrs ago-no residual   Tubular adenoma of colon 07/2011  colon polyps ans reoccurence with malignancy found    Past Surgical History:  Procedure Laterality Date   APPENDECTOMY     BOWEL DECOMPRESSION N/A 06/12/2018   Procedure: BOWEL DECOMPRESSION;  Surgeon: Irving Copas., MD;  Location: [facility];  Service: Gastroenterology;  Laterality: N/A;   cataracts Bilateral    COLECTOMY WITH COLOSTOMY CREATION/HARTMANN PROCEDURE  06/16/2018   Procedure: CREATION/HARTMANN COLOSTOMY;  Surgeon: Michael Boston, MD;  Location: [facility];  Service: General;;   COLONOSCOPY     COLOSTOMY N/A 06/16/2018   Procedure: POSSIBLE COLOSTOMY;  Surgeon: Michael Boston, MD;  Location: [facility];  Service: General;  Laterality: N/A;   EYE SURGERY     FLEXIBLE SIGMOIDOSCOPY N/A 06/12/2018   Procedure: FLEXIBLE SIGMOIDOSCOPY;  Surgeon: Irving Copas., MD;  Location: [facility];  Service: Gastroenterology;  Laterality: N/A;   LAPAROSCOPIC SIGMOID COLECTOMY N/A 06/16/2018   Procedure: LAPAROSCOPIC LOW ANTERIOR RESECTION;  Surgeon: Michael Boston, MD;  Location: [facility];  Service: General;  Laterality: N/A;   LUNG SURGERY     Resection -" not done. Pt . tx with Tarceva   MASTECTOMY Bilateral    MEDIASTINOSCOPY N/A 10/02/2016   Procedure: MEDIASTINOSCOPY;  Surgeon: [name], MD;  [facility];  Service: Thoracic;  Laterality: N/A;   RECONSTRUCTION BREAST W/ TRAM FLAP Bilateral    bilaterally   robotic right hemicolectomy Right 03/06/2016   Dr. Johney Maine   TEE WITHOUT CARDIOVERSION N/A 06/04/2013   Procedure: TRANSESOPHAGEAL ECHOCARDIOGRAM (TEE);  Surgeon: Jolaine Artist, MD;  Location: [facility];  Service: Cardiovascular;  Laterality: N/A;   TONSILLECTOMY     TOTAL HIP ARTHROPLASTY Left 08/07/2016   Procedure: LEFT TOTAL HIP ARTHROPLASTY ANTERIOR APPROACH;  Surgeon: Gaynelle Arabian, MD;  Location: [facility];  Service: Orthopedics;  Laterality: Left;     Home Medications:  Prior to Admission medications   Medication Sig Start Date End Date Taking? Authorizing Provider  amLODipine (NORVASC) 5 MG tablet Take 5 tablets by mouth in the morning and at bedtime.  03/08/19  Yes Eber Hong, MD  brimonidine-timolol (COMBIGAN) 0.2-0.5 % ophthalmic solution Place 1 drop into the right eye every 12 (twelve) hours.   Yes [provider]  Cholecalciferol (VITAMIN D3) 2000 units capsule Take 2,000 Units daily by mouth.   Yes [provider]  clopidogrel (PLAVIX) 75 MG tablet Take  75 mg by mouth daily. 06/25/16  Yes [provider]  cycloSPORINE (RESTASIS) 0.05 % ophthalmic emulsion Place 1 drop into both eyes 2 (two) times daily.   Yes [provider]  escitalopram (LEXAPRO) 10 MG tablet Take 10 mg by mouth daily.  05/20/13  Yes Philmore Pali, NP  famotidine (PEPCID) 40 MG tablet Take 40 mg by mouth daily.   Yes [provider]  FeFum-FePoly-FA-B Cmp-C-Biot (FOLIVANE-PLUS) CAPS TAKE 1 CAPSULE BY MOUTH EVERY DAY IN THE MORNING Patient taking differently: Take 1 capsule by mouth daily.  12/20/19  Yes Curt Bears, MD  letrozole [facility]) 2.5 MG tablet TAKE 1 TABLET BY MOUTH EVERY DAY Patient taking differently: Take 2.5 mg by mouth daily.  03/08/19  Yes Curt Bears, MD  lisinopril (ZESTRIL) 20 MG tablet Take 20 mg by mouth daily. 02/01/20  Yes [provider]  Multiple Vitamins-Minerals (PRESERVISION AREDS 2) CAPS Take by mouth.   Yes [provider]  nitrofurantoin, macrocrystal-monohydrate, (MACROBID) 100 MG capsule Take 1 capsule (100 mg total) by mouth 2 (two) times daily. 02/07/20  Yes Curt Bears, MD  osimertinib mesylate [name])  80 MG tablet Take 1 tablet (80 mg total) by mouth daily. 01/07/20  Yes Heilingoetter, Cassandra L, PA-C  prochlorperazine (COMPAZINE) 10 MG tablet Take 1 tablet (10 mg total) by mouth every 6 (six) hours as needed for nausea or vomiting. 02/07/20  Yes Curt Bears, MD  feeding supplement, ENSURE ENLIVE, (ENSURE ENLIVE) LIQD Take 237 mLs by mouth 2 (two) times daily between meals. Patient not taking: Reported on 02/07/2020 06/25/18   Margie Billet A, PA-C  Multiple Vitamins-Iron (MULTIVITAMINS WITH IRON) TABS tablet Take 1 tablet by mouth daily. Patient not taking: Reported on 02/07/2020 06/25/18   Wellington Hampshire, PA-C    Inpatient Medications: Scheduled Meds:  brimonidine  1 drop Right Eye BID   And   timolol  1 drop Right Eye BID   Chlorhexidine Gluconate Cloth  6 each Topical Daily    cholecalciferol  2,000 Units Oral Daily   clopidogrel  75 mg Oral Daily   cycloSPORINE  1 drop Both Eyes BID   diltiazem  10 mg Intravenous Once   escitalopram  10 mg Oral Daily   famotidine  20 mg Oral Daily   feeding supplement (ENSURE ENLIVE)  237 mL Oral BID BM   letrozole  2.5 mg Oral Daily   lisinopril  20 mg Oral Daily   mouth rinse  15 mL Mouth Rinse BID   multivitamin with minerals  1 tablet Oral Daily   osimertinib mesylate  80 mg Oral Daily   pantoprazole  40 mg Oral QAC breakfast   Continuous Infusions:  sodium chloride Stopped (02/11/20 0020)   amiodarone 30 mg/hr (02/11/20 0727)   cefTRIAXone (ROCEPHIN)  IV Stopped (02/10/20 2222)   diltiazem (CARDIZEM) infusion 12.5 mg/hr (02/11/20 0715)   heparin 1,250 Units/hr (02/11/20 0715)   PRN Meds: ALPRAZolam, ondansetron **OR** ondansetron (ZOFRAN) IV, oxyCODONE-acetaminophen  Allergies:    Allergies  Allergen Reactions   Doxycycline Nausea Only    Weak, stomach   Sulfa Antibiotics Rash    Social History:   Social History   Socioeconomic History   Marital status: Married    Spouse name: Not on file   Number of children: 2   Years of education: college   Highest education level: Not on file  Occupational History   Occupation: Retired    Fish farm manager: RETIRED  Tobacco Use   Smoking status: Never Smoker   Smokeless tobacco: Never Used  Substance and Sexual Activity   Alcohol use: Yes    Alcohol/week: 0.0 standard drinks    Comment: ocassional beer   Drug use: No   Sexual activity: Not Currently  Other Topics Concern   Not on file  Social History Narrative   Patient lives at home with her husband. Patient drinks caffinated  Drinks daily.   Social Determinants of Health   Financial Resource Strain:    Difficulty of Paying Living Expenses:   Food Insecurity:    Worried About Charity fundraiser in the Last Year:    Arboriculturist in the Last Year:   Transportation Needs:     Film/video editor (Medical):    Lack of Transportation (Non-Medical):   Physical Activity:    Days of Exercise per Week:    Minutes of Exercise per Session:   Stress:    Feeling of Stress :   Social Connections:    Frequency of Communication with Friends and Family:    Frequency of Social Gatherings with Friends and Family:    Attends Religious Services:  Active Member of Clubs or Organizations:    Attends Music therapist:    Marital Status:   Intimate Partner Violence:    Fear of Current or Ex-Partner:    Emotionally Abused:    Physically Abused:    Sexually Abused:     Family History:   Family History  Problem Relation Age of Onset   Lung cancer Father    Heart disease Mother    Clotting disorder Mother 72       coronary thrombosis   Colon cancer Sister        dx in her 23s   Breast cancer Sister 63   Multiple myeloma Paternal Grandmother    Brain cancer Paternal Uncle    Cancer Paternal Aunt        unknown   Colon cancer Maternal Aunt 80   Prostate cancer Maternal Uncle        dx in his late 46s   Other Maternal Uncle        brain tumor dx in his 27s   Lung cancer Maternal Uncle    Cancer Maternal Uncle        NOS   Stomach cancer Paternal Aunt    Brain cancer Cousin        maternal first cousin dx in late 43s-50s   Cancer Paternal Uncle        NOS   Colon cancer Cousin        paternal cousin dx in his 57s   Cancer Cousin        paternal cousin dx with NOS cancer     ROS:  Please see the history of present illness.  All other ROS reviewed and negative.     Physical Exam/Data:   Vitals:   02/11/20 0930 02/11/20 0945 02/11/20 1000 02/11/20 1015  BP: (!) 115/58  118/72   Pulse:      Resp: (!) 43 (!) 34 (!) 39 (!) 40  Temp:      TempSrc:      SpO2:      Weight:      Height:        Intake/Output Summary (Last 24 hours) at 02/11/2020 1045 Last data filed at 02/11/2020 0715 Gross per 24 hour    Intake 1230.51 ml  Output 200 ml  Net 1030.51 ml   Last 3 Weights 02/10/2020 02/09/2020 02/09/2020  Weight (lbs) 137 lb 9.1 oz 127 lb 13.9 oz 128 lb  Weight (kg) 62.4 kg 58 kg 58.06 kg     Body mass index is 22.2 kg/m.  General:  Frail WF in no acute distress HEENT: normal Lymph: no adenopathy Neck: no JVD Endocrine:  No thryomegaly Vascular: No carotid bruits; FA pulses 2+ bilaterally without bruits  Cardiac:  normal S1, S2; Irreg Irreg; no murmur  Lungs: diminished at bases, mild crackles Abd: soft, nontender, no hepatomegaly  Ext: no edema Musculoskeletal:  No deformities, BUE and BLE strength normal and equal Skin: warm and dry  Neuro:  CNs 2-12 intact, no focal abnormalities noted Psych:  Normal affect   EKG:  The EKG was personally reviewed and demonstrates:  Afib, rates in the 160s Telemetry:  Telemetry was personally reviewed and demonstrates:  Afib, HR improving, currently in the 80-90s  Relevant CV Studies:  Echo 02/10/20 1. Left ventricular ejection fraction, by estimation, is 50 to 55%. The  left ventricle has low normal function. The left ventricle has no regional  wall motion abnormalities. Left ventricular diastolic  parameters are  consistent with Grade I diastolic  dysfunction (impaired relaxation).  2. Right ventricular systolic function is moderately reduced. The right  ventricular size is moderately enlarged. D-shaped septum suggestive of RV  pressure/volume overload. No complete TR doppler jet so unable to estimate  PA systolic pressure.  3. The aortic valve is tricuspid. Aortic valve regurgitation is trivial.  No aortic stenosis is present.  4. The mitral valve is normal in structure. No evidence of mitral valve  regurgitation. No evidence of mitral stenosis.  5. The inferior vena cava is dilated in size with <50% respiratory  variability, suggesting right atrial pressure of 15 mmHg.  6. Left-sided pleural effusion noted. Trivial pericardial  effusion.   Laboratory Data:  High Sensitivity Troponin:   Recent Labs  Lab 02/10/20 0003 02/10/20 0224  TROPONINIHS 681* 637*     Chemistry Recent Labs  Lab 02/10/20 0332 02/11/20 0043 02/11/20 0511  NA 139 135 135   136  K 4.7 4.8 4.6   4.6  CL 109 105 104   106  CO2 21* 18* 20*   21*  GLUCOSE 169* 158* 143*   144*  BUN 25* 30* 33*   35*  CREATININE 0.97 1.03* 0.98   0.99  CALCIUM 9.1 9.3 9.5   9.6  GFRNONAA 55* 51* 54*   54*  GFRAA >60 59* >60   >60  ANIONGAP _0 Recent Labs  Lab 02/07/20 1021 02/09/20 1723 02/11/20 0511  PROT 7.1 6.6 6.0*   5.9*  ALBUMIN 3.1* 2.9* 3.2*   3.1*  AST 25 55* 69*   67*  ALT 27 58* 71*   69*  ALKPHOS 139* 186* 138*   133*  BILITOT 0.5 0.7 0.6   0.5   Hematology Recent Labs  Lab 02/09/20 1723 02/09/20 1723 02/10/20 0332 02/11/20 0043 02/11/20 0511  WBC 12.1*  --  16.7*  --  12.1*  RBC 3.68*  --  2.96*  --  2.84*  HGB 10.6*   < > 8.5* 9.8* 8.3*  HCT 33.5*   < > 27.2* 31.1* 26.0*  MCV 91.0  --  91.9  --  91.5  MCH 28.8  --  28.7  --  29.2  MCHC 31.6  --  31.3  --  31.9  RDW 12.8  --  13.0  --  13.2  PLT 165  --  123*  --  160   < > = values in this interval not displayed.   BNP Recent Labs  Lab 02/10/20 1339  BNP 717.3*    DDimer No results for input(s): DDIMER in the last 168 hours.   Radiology/Studies:  CT ANGIO CHEST PE W OR WO CONTRAST  Result Date: 02/09/2020 CLINICAL DATA:  Shortness of breath, weakness, left upper lobe lung cancer EXAM: CT ANGIOGRAPHY CHEST WITH CONTRAST TECHNIQUE: Multidetector CT imaging of the chest was performed using the standard protocol during bolus administration of intravenous contrast. Multiplanar CT image reconstructions and MIPs were obtained to evaluate the vascular anatomy. CONTRAST:  13m OMNIPAQUE IOHEXOL 350 MG/ML SOLN COMPARISON:  12/24/2019 FINDINGS: Cardiovascular: This is a technically adequate evaluation of the pulmonary vasculature. There are segmental right  lower lobe pulmonary emboli. Clot burden is minimal. No evidence of right heart strain. No pericardial effusion. Thoracic aorta is normal in caliber with no aneurysm or dissection. Mediastinum/Nodes: No pathologic adenopathy. There is a large hiatal hernia. Esophagus, trachea, and thyroid are stable. Lungs/Pleura: Since the previous  exam, there has been significant enlargement of the left pleural effusion now measuring greater than 1 L in volume. The effusion is partially loculated, with continued areas of pleural thickening. There is dense left lower lobe consolidation consistent with atelectasis. There is a trace right pleural effusion. Patchy hypoventilatory changes are seen at the right lung base. No pneumothorax. Central airways patent. Upper Abdomen: Stable right lobe liver cyst. Remainder of the upper abdomen is unremarkable. Musculoskeletal: No acute or destructive bony lesions. Reconstructed images demonstrate no additional findings. Review of the MIP images confirms the above findings. IMPRESSION: 1. Right lower lobe segmental pulmonary emboli. Clot burden is minimal. 2. Significant interval enlargement of the partially loculated left pleural effusion, now measuring greater than 1 L in volume. Continued areas of pleural thickening suspicious for recurrent disease. 3. Trace right pleural effusion. 4. Hiatal hernia. These results were called by telephone at the time of interpretation on 02/09/2020 at 11:40 pm to provider [facility] , who verbally acknowledged these results. Electronically Signed   By: Randa Ngo M.D.   On: 02/09/2020 23:40   DG Chest Port 1 View  Result Date: 02/11/2020 CLINICAL DATA:  Respiratory failure EXAM: PORTABLE CHEST 1 VIEW COMPARISON:  02/10/2020 FINDINGS: Cardiomegaly with vascular congestion. Small left pleural effusion. Left lower lobe atelectasis. Bilateral interstitial prominence may reflect mild interstitial edema. IMPRESSION: Cardiomegaly, suspect mild  interstitial edema. Small left effusion with left base atelectasis. Electronically Signed   By: Rolm Baptise M.D.   On: 02/11/2020 02:22   DG CHEST PORT 1 VIEW  Result Date: 02/10/2020 CLINICAL DATA:  Post left thoracentesis EXAM: PORTABLE CHEST 1 VIEW COMPARISON:  02/09/2020 FINDINGS: Interval decrease in the left effusion following thoracentesis. Small left effusion remains. No pneumothorax. Left base atelectasis. Mild cardiomegaly. Right lung clear. IMPRESSION: Decreasing left effusion following thoracentesis.  No pneumothorax. Small left effusion and left base atelectasis. Electronically Signed   By: Rolm Baptise M.D.   On: 02/10/2020 19:13   DG Chest Port 1 View  Result Date: 02/09/2020 CLINICAL DATA:  Short of breath EXAM: PORTABLE CHEST 1 VIEW COMPARISON:  06/24/2018, CT 12/24/2019 FINDINGS: Postoperative changes in the left upper lung. Moderate left pleural effusion increased compared to prior. Probable trace right effusion. Airspace disease at the left base. Obscured cardiomediastinal silhouette. Hiatal hernia. Convex opacity over the lower mediastinal silhouette. IMPRESSION: 1. Postsurgical changes of the left chest. Moderate left pleural effusion, increased compared to prior. Probable trace right pleural effusion. Airspace disease at the left lung base. Electronically Signed   By: Donavan Foil M.D.   On: 02/09/2020 18:07   ECHOCARDIOGRAM COMPLETE  Result Date: 02/10/2020    ECHOCARDIOGRAM REPORT   Patient Name:   Kristin [name] Date of Exam: 02/10/2020 Medical Rec #:  [id number]       Height:       66.0 in Accession #:    [id number]      Weight:       127.9 lb Date of Birth:  June 12, 1941       BSA:          1.654 m Patient Age:    61 years        BP:           111/58 mmHg Patient Gender: F               HR:           88 bpm. Exam Location:  Inpatient Procedure: 2D Echo and Intracardiac Opacification Agent  Indications:    Pulmonary Embolus 415.19 / I26.99  History:        Patient has prior  history of Echocardiogram examinations, most                 recent 02/01/2016. Risk Factors:Hypertension. CKD                 Pleural effusion.  Sonographer:    Vikki Ports Turrentine Referring Phys: 44 Rise Patience  Sonographer Comments: Technically difficult study due to poor echo windows. Image acquisition challenging due to patient body habitus. IMPRESSIONS  1. Left ventricular ejection fraction, by estimation, is 50 to 55%. The left ventricle has low normal function. The left ventricle has no regional wall motion abnormalities. Left ventricular diastolic parameters are consistent with Grade I diastolic dysfunction (impaired relaxation).  2. Right ventricular systolic function is moderately reduced. The right ventricular size is moderately enlarged. D-shaped septum suggestive of RV pressure/volume overload. No complete TR doppler jet so unable to estimate PA systolic pressure.  3. The aortic valve is tricuspid. Aortic valve regurgitation is trivial. No aortic stenosis is present.  4. The mitral valve is normal in structure. No evidence of mitral valve regurgitation. No evidence of mitral stenosis.  5. The inferior vena cava is dilated in size with <50% respiratory variability, suggesting right atrial pressure of 15 mmHg.  6. Left-sided pleural effusion noted. Trivial pericardial effusion. FINDINGS  Left Ventricle: Left ventricular ejection fraction, by estimation, is 50 to 55%. The left ventricle has low normal function. The left ventricle has no regional wall motion abnormalities. Definity contrast agent was given IV to delineate the left ventricular endocardial borders. The left ventricular internal cavity size was normal in size. There is no left ventricular hypertrophy. Left ventricular diastolic parameters are consistent with Grade I diastolic dysfunction (impaired relaxation). Right Ventricle: The right ventricular size is moderately enlarged. No increase in right ventricular wall thickness. Right  ventricular systolic function is moderately reduced. Tricuspid regurgitation signal is inadequate for assessing PA pressure. Left Atrium: Left atrial size was normal in size. Right Atrium: Right atrial size was normal in size. Pericardium: Trivial pericardial effusion is present. Mitral Valve: The mitral valve is normal in structure. No evidence of mitral valve regurgitation. No evidence of mitral valve stenosis. Tricuspid Valve: The tricuspid valve is normal in structure. Tricuspid valve regurgitation is not demonstrated. Aortic Valve: The aortic valve is tricuspid. Aortic valve regurgitation is trivial. No aortic stenosis is present. Pulmonic Valve: The pulmonic valve was normal in structure. Pulmonic valve regurgitation is not visualized. Aorta: The aortic root is normal in size and structure. Venous: The inferior vena cava is dilated in size with less than 50% respiratory variability, suggesting right atrial pressure of 15 mmHg. IAS/Shunts: No atrial level shunt detected by color flow Doppler.  LEFT VENTRICLE PLAX 2D LVOT diam:     1.80 cm  Diastology LV SV:         36       LV e' lateral:   9.36 cm/s LV SV Index:   22       LV E/e' lateral: 3.6 LVOT Area:     2.54 cm LV e' medial:    7.83 cm/s                         LV E/e' medial:  4.3  LEFT ATRIUM           Index LA Vol (A4C): 36.2 ml  21.89 ml/m  AORTIC VALVE LVOT Vmax:   84.90 cm/s LVOT Vmean:  61.100 cm/s LVOT VTI:    0.141 m MITRAL VALVE MV Area (PHT): 3.37 cm    SHUNTS MV Decel Time: 225 msec    Systemic VTI:  0.14 m MV E velocity: 33.40 cm/s  Systemic Diam: 1.80 cm MV A velocity: 59.70 cm/s MV E/A ratio:  0.56 Loralie Champagne MD Electronically signed by Loralie Champagne MD Signature Date/Time: 02/10/2020/4:49:30 PM    Final    CT RENAL STONE STUDY  Result Date: 02/09/2020 CLINICAL DATA:  Flank pain, kidney stone suspected. EXAM: CT ABDOMEN AND PELVIS WITHOUT CONTRAST TECHNIQUE: Multidetector CT imaging of the abdomen and pelvis was performed following  the standard protocol without IV contrast. COMPARISON:  Abdominal CT 12/24/2019. Concurrent chest CTA, reported separately. FINDINGS: Lower chest: Left pleural effusion and basilar consolidation assessed on concurrent chest CT, reported separately. Moderately large hiatal hernia, partially obscured by motion artifact. Hepatobiliary: Cyst in the right lobe of the liver is grossly stable, motion artifact partially obscures evaluation. There is some high-density material within the gallbladder that may represent sludge. More detailed evaluation is limited given motion. No biliary dilatation. Pancreas: Motion artifact through the pancreas limits assessment. No ductal dilatation. No obvious peripancreatic inflammation Spleen: Normal in size with small cleft medially. Adrenals/Urinary Tract: No adrenal nodule. No hydronephrosis. No renal or ureteral calculi. Mild bilateral renal parenchymal thinning and cortical scarring in the lower right kidney. There is symmetric bilateral perinephric edema. Urinary bladder is unremarkable, no bladder stone. Stomach/Bowel: Moderately large hiatal hernia. Motion limits more detailed assessment. No evidence of bowel obstruction. No obvious bowel inflammation, lack of contrast and motion limits detailed bowel assessment. Descending colostomy. Mild descending colonic diverticulosis without diverticulitis chain sutures noted in the cecum. Vascular/Lymphatic: Aorto bi-iliac atherosclerosis. No grossly enlarged abdominopelvic lymph nodes, detailed assessment limited given motion and lack of contrast. Reproductive: Calcified uterine fibroids. No adnexal mass. Other: Mild generalized body wall edema. No ascites. No free air. Musculoskeletal: Left hip arthroplasty. Bones are under mineralized. Stable bone island in the mid sacrum. Multilevel degenerative change in the lumbar spine. IMPRESSION: 1. Motion limited exam. 2. No renal stones or obstructive uropathy. Symmetric bilateral perinephric  edema is nonspecific. 3. Moderately large hiatal hernia. 4. High-density material within the gallbladder may represent sludge. More detailed evaluation is limited given motion artifact. Aortic Atherosclerosis (ICD10-I70.0). Electronically Signed   By: Keith Rake M.D.   On: 02/09/2020 23:30     Assessment and Plan:    Afib with RVR Patient was admitted for PE, UTI, left pleural effusion s/p thoracentesis 02/10/20 now with new onset afib RVR rates initially  up to 150s started on IV Cardizem and IV amiodarone.  Rate has responded Most likely afib is due to hyperthyroidsim and  acute illness Atria are normal in size - Patient had previously been started on IV heparin for PE.  - CHADSVASC = 6 (female, Age x2, CVA x 2, HTN). - TSH 0.015. patient has history of hyperthyroidism - Rates have improved and now in the 80-90s. Patient appears comfortable, denies CP.   Recomm: Continue anticoag for now with PE Continue amiodarone for now   Can pull back on dilt if need to  Need to correct medical problems   Free T4 normal   Check free T3   Will need to follow thyroid closely if using amiodarone   May be able to back off if improves clinically  Long term Rx depends on continued  afib or not  Acute right lower lobe PE - seen on CTA chest, minimal clot burden  - started on IV heparin - Echo with RVF with moderately reduced function w/ D shaped septum suggestive of RV overload - on 5-6L O2   Lung cancer/h/o of breast cancer/ adenocarcinoma s/p colostomy - followed by oncology Dr. Julien Nordmann - Oncology seeing during admission  HTN - blood pressure a little lowt - hold antihypertensives as needed - stop dilt gtt - prior to admission was on amlodipine 41m, lisinopril 20 mg daily. Might need to hold ACE for addition of BB for HR control  H/o CVA - plavix  For questions or updates, please contact CPawneePlease consult [email or web address] for contact info under     Signed, Cadence HNinfa Meeker PA-C  02/11/2020 10:45 AM   Pt seen and examined   I agree with findings as noted above by CRead Drivers Pt is an 89with metastatic lung CA  Admitted with SOB, weakness.  Pt found to have a PE  Yesterday pt developed afib with RVR  Rx with diltiazem IV and amiodarone IV   Rates have slowed  Currently, pt is comfortable laying in bed Neck:  JVP normal LUngs with mild rhonchi Cardiac Irreg irreg  No S3   Abd is supple Ext are without edema   Feet warm  Labs signif for low TSH but normal T4     Most likely afib being driven by acute events (PE, Lung CA, +/- thyroid dysfunction (Check T3))    Would continue IV amiodarone   Can wean dilt if needed because of BP Continue on anticoagulation for PE   Long term Rx will depend on needs and ability to tolerate anticoagulation Follow thyroid function.  Will continue to follow   PDorris CarnesMD

## 2020-02-11 NOTE — Progress Notes (Addendum)
Per Charge RN Gilmer Mor), Dr Lucile Shutters is ok with holding off on aline at this time.

## 2020-02-11 NOTE — Progress Notes (Addendum)
NAME:  Kristin [name], MRN:  [id number], DOB:  [birth year], LOS: 1 ADMISSION DATE:  02/09/2020, CONSULTATION DATE:  02/10/2020 REFERRING MD:  Dr. Reesa Chew, CHIEF COMPLAINT:  Fever and shortness of breath   Brief History   79yo female with extensive PMH who presented with complaint of shortness of breath, weakness, weight loss, and fever. Found to have large pleural effusion and PE.   History of present illness   Kristin [name] is a 79 yo female with PMH significant for metastatic non-small cell lung cancer, colon cancer s/p colostomy, breast cancer, hypertension, prior CVA, and anxiety who presented to the ED due to complaints of shortness of breath, weakness, fever, and weight loss. Per patient she was seen by oncologist 3 days prior to admission for follow up where she received IV fluids and empirically started on Macrobid for presumed UTI. She additionally reports left sided back/falnk pain. Patient continued to feel poor with eventually development of fever of 103 which prompted presentation to the ER.   On admission lab works significant for Creatinine 1.21 (baseline 0.9-1.0), elevated alkaline phos of 182, elevated WBC of 12.1, hgb 10.6 elevated BNP of 717.3. Vitals on admission significant for tachypnea and mild tachycardia meeting criteria for SIRS. Workup thus far reveals acute PE with CT evidence of RV strain as well as significant enlargement large left pleural effusion.   PCCM was consulted for assistance in management of pleural effusion and PE Given SIRS criteria with presence of left pleural effusion patient needs diagnostic thoracentesis to rule out empyema.   Past Medical History  Anemia of chronic disease Breast cancer Colon cancer NSCL cancer  CVA Hypertension  Significant Hospital Events   Admitted 4/22  Consults:    Procedures:  Left thora 4/22 > 941m blood pleural fluid removed   Significant Diagnostic Tests:  CTA chest 4/22 >  evidence of RV strain as well as  significant enlargement large left pleural effusion.   ECHO 4/22 > 1. Left ventricular ejection fraction, by estimation, is 50 to 55%. The  left ventricle has low normal function. The left ventricle has no regional  wall motion abnormalities. Left ventricular diastolic parameters are  consistent with Grade I diastolic  dysfunction (impaired relaxation).  2. Right ventricular systolic function is moderately reduced. The right  ventricular size is moderately enlarged. D-shaped septum suggestive of RV  pressure/volume overload. No complete TR doppler jet so unable to estimate  PA systolic pressure.  3. The aortic valve is tricuspid. Aortic valve regurgitation is trivial.  No aortic stenosis is present.  4. The mitral valve is normal in structure. No evidence of mitral valve  regurgitation. No evidence of mitral stenosis.  5. The inferior vena cava is dilated in size with <50% respiratory  variability, suggesting right atrial pressure of 15 mmHg.  6. Left-sided pleural effusion noted. Trivial pericardial effusion.   Micro Data:  Urine culture 4/22 > 4,000 positive gram negative rods Blood culture 4/22 > COVID 4/22 > negative  Antimicrobials:  Ceftriaxone 4/22    Interim history/subjective:  RN reports development of A-fib RVR overnight with urinary retention as well. She reports improved dyspnea post thora 4/22  Objective   Blood pressure 113/63, pulse (!) 115, temperature (!) 97.3 F (36.3 C), temperature source Oral, resp. rate (!) 27, height 5' 6"  (1.676 m), weight 62.4 kg, SpO2 100 %.        Intake/Output Summary (Last 24 hours) at 02/11/2020 1000 Last data filed at 02/11/2020 0715 Gross per 24 hour  Intake 1230.51 ml  Output 200 ml  Net 1030.51 ml   Filed Weights   02/09/20 1718 02/09/20 1832 02/10/20 1529  Weight: 58.1 kg 58 kg 62.4 kg    Examination: General: Chronically ill appearing elderly female deconditioned, lying in bed in NAD HEENT: [place]/AT, MM  pink/moist, PERRL,  Neuro: Alert and oriented x3, non-focal  CV: s1s2 regular rate and rhythm, no murmur, rubs, or gallops,  PULM:  Clear to ascultation, slightly diminished in left lower base, non added breath sounds  GI: soft, bowel sounds active in all 4 quadrants, non-tender, non-distended Extremities: warm/dry, no edema  Skin: no rashes or lesions  Resolved Hospital Problem list     Assessment & Plan:  Acute right lower lobe PE  -Complicated by elevated troponin, elevated BNP, and RV dilation seen on CT  -PESI score V Large loculated left pleural effusion  -Per chart review effusion appears to be chronic but has increased in size with concern for infection SIRS secondary to UTI but need to rule out empyema as well  P: ECHO with EF of 50-55% with moderately reduced RV and enlargement  Continue systemic heparin  Goal INR 2 w/ 5 days minimal  Overlap. Follow pleural cytology and culture  Pleural LDH 317, serum LDH not obtained will collect today  Fluid likely exudative  Continue IV antibiotics  Follow cultures   Rest of chronic medical conditions managed by primary   PCCM will continue to follow   Best practice:  Diet: Heart healthy  Pain/Anxiety/Delirium protocol (if indicated): PRNs VAP protocol (if indicated): N/A DVT prophylaxis: Heparin drip GI prophylaxis: PPI Glucose control: Monitor  Mobility: Bedrest  Code Status: Full Family Communication: Daughter updated at bedside  Disposition: ICU   Labs   CBC: Recent Labs  Lab 02/07/20 1021 02/09/20 1723 02/10/20 0332 02/11/20 0043 02/11/20 0511  WBC 11.5* 12.1* 16.7*  --  12.1*  NEUTROABS 9.2* 9.6*  --   --   --   HGB 11.7* 10.6* 8.5* 9.8* 8.3*  HCT 36.3 33.5* 27.2* 31.1* 26.0*  MCV 90.5 91.0 91.9  --  91.5  PLT 231 165 123*  --  664    Basic Metabolic Panel: Recent Labs  Lab 02/07/20 1021 02/09/20 1723 02/10/20 0332 02/11/20 0043 02/11/20 0511  NA 137 135 139 135 135  136  K 4.4 4.7 4.7 4.8 4.6   4.6  CL 102 102 109 105 104  106  CO2 23 22 21* 18* 20*  21*  GLUCOSE 126* 182* 169* 158* 143*  144*  BUN 24* 28* 25* 30* 33*  35*  CREATININE 1.13* 1.21* 0.97 1.03* 0.98  0.99  CALCIUM 9.9 9.6 9.1 9.3 9.5  9.6  MG  --   --   --  2.1  --    GFR: Estimated Creatinine Clearance: 42.4 mL/min (by C-G formula based on SCr of 0.99 mg/dL). Recent Labs  Lab 02/07/20 1021 02/09/20 1723 02/09/20 1733 02/09/20 2010 02/10/20 0332 02/11/20 0511  WBC 11.5* 12.1*  --   --  16.7* 12.1*  LATICACIDVEN  --   --  1.6 1.0  --   --     Liver Function Tests: Recent Labs  Lab 02/07/20 1021 02/09/20 1723 02/11/20 0511  AST 25 55* 69*  67*  ALT 27 58* 71*  69*  ALKPHOS 139* 186* 138*  133*  BILITOT 0.5 0.7 0.6  0.5  PROT 7.1 6.6 6.0*  5.9*  ALBUMIN 3.1* 2.9* 3.2*  3.1*   No  results for input(s): LIPASE, AMYLASE in the last 168 hours. No results for input(s): AMMONIA in the last 168 hours.  ABG    Component Value Date/Time   PHART 7.326 (L) 01/12/2011 0025   PCO2ART 43.6 01/12/2011 0025   PO2ART 86.0 01/12/2011 0025   HCO3 22.9 01/12/2011 0025   TCO2 26 04/29/2013 2136   ACIDBASEDEF 3.0 (H) 01/12/2011 0025   O2SAT 96.0 01/12/2011 0025     Coagulation Profile: Recent Labs  Lab 02/09/20 1723  INR 1.3*    Cardiac Enzymes: No results for input(s): CKTOTAL, CKMB, CKMBINDEX, TROPONINI in the last 168 hours.  HbA1C: Hgb A1c MFr Bld  Date/Time Value Ref Range Status  06/15/2018 03:25 AM 5.2 4.8 - 5.6 % Final    Comment:    (NOTE) Pre diabetes:          5.7%-6.4% Diabetes:              >6.4% Glycemic control for   <7.0% adults with diabetes   02/23/2016 02:30 PM 5.1 4.8 - 5.6 % Final    Comment:    (NOTE)         Pre-diabetes: 5.7 - 6.4         Diabetes: >6.4         Glycemic control for adults with diabetes: <7.0     CBG: No results for input(s): GLUCAP in the last 168 hours.  Review of Systems: Positive in bold   Gen: Denies fever, chills, weight  change, fatigue, night sweats HEENT: Denies blurred vision, double vision, hearing loss, tinnitus, sinus congestion, rhinorrhea, sore throat, neck stiffness, dysphagia PULM: Denies shortness of breath, cough, sputum production, hemoptysis, wheezing CV: Denies chest pain, edema, orthopnea, paroxysmal nocturnal dyspnea, palpitations GI: Denies abdominal pain, nausea, vomiting, diarrhea, hematochezia, melena, constipation, change in bowel habits GU: Denies dysuria, hematuria, polyuria, oliguria, urethral discharge Endocrine: Denies hot or cold intolerance, polyuria, polyphagia or appetite change Derm: Denies rash, dry skin, scaling or peeling skin change Heme: Denies easy bruising, bleeding, bleeding gums Neuro: Denies headache, numbness, weakness, slurred speech, loss of memory or consciousness  Past Medical History  She,  has a past medical history of Allergy, Anemia, Anemia of chronic disease (09/21/2016), Anxiety, Arthritis, Blood transfusion without reported diagnosis, Breast cancer ([place]) (2002), 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   Surgical History    Past Surgical History:  Procedure Laterality Date  . APPENDECTOMY    . BOWEL DECOMPRESSION N/A 06/12/2018   Procedure: BOWEL DECOMPRESSION;  Surgeon: Rush Landmark Telford Nab., MD;  Location: [facility];  Service: Gastroenterology;  Laterality: N/A;  . cataracts Bilateral   . COLECTOMY WITH COLOSTOMY CREATION/HARTMANN PROCEDURE   06/16/2018   Procedure: CREATION/HARTMANN COLOSTOMY;  Surgeon: Michael Boston, MD;  Location: [facility];  Service: General;;  . COLONOSCOPY    . COLOSTOMY N/A 06/16/2018   Procedure: POSSIBLE COLOSTOMY;  Surgeon: Michael Boston, MD;  Location: [facility];  Service: General;  Laterality: N/A;  . EYE SURGERY    . FLEXIBLE SIGMOIDOSCOPY N/A 06/12/2018   Procedure: FLEXIBLE SIGMOIDOSCOPY;  Surgeon: Rush Landmark Telford Nab., MD;  Location: [facility];  Service: Gastroenterology;  Laterality:  N/A;  . LAPAROSCOPIC SIGMOID COLECTOMY N/A 06/16/2018   Procedure: LAPAROSCOPIC LOW ANTERIOR RESECTION;  Surgeon: Michael Boston, MD;  Location: [facility];  Service: General;  Laterality: N/A;  . LUNG SURGERY     Resection -" not done. Pt . tx with Tarceva  . MASTECTOMY Bilateral   . MEDIASTINOSCOPY N/A 10/02/2016   Procedure: MEDIASTINOSCOPY;  Surgeon: [name], MD;  Location: [facility] OR;  Service: Thoracic;  Laterality: N/A;  . RECONSTRUCTION BREAST W/ TRAM FLAP Bilateral    bilaterally  . robotic right hemicolectomy Right 03/06/2016   Dr. Johney Maine  . TEE WITHOUT CARDIOVERSION N/A 06/04/2013   Procedure: TRANSESOPHAGEAL ECHOCARDIOGRAM (TEE);  Surgeon: Jolaine Artist, MD;  Location: [facility];  Service: Cardiovascular;  Laterality: N/A;  . TONSILLECTOMY    . TOTAL HIP ARTHROPLASTY Left 08/07/2016   Procedure: LEFT TOTAL HIP ARTHROPLASTY ANTERIOR APPROACH;  Surgeon: Gaynelle Arabian, MD;  Location: [facility];  Service: Orthopedics;  Laterality: Left;     Social History   reports that she has never smoked. She has never used smokeless tobacco. She reports current alcohol use. She reports that she does not use drugs.   Family History   Her family history includes Brain cancer in her cousin and paternal uncle; Breast cancer (age of onset: 46) in her sister; Cancer in her cousin, maternal uncle, paternal aunt, and paternal uncle; Clotting disorder (age of onset: 17) in her mother; Colon cancer in her cousin and sister;  Colon cancer (age of onset: 22) in her maternal aunt; Heart disease in her mother; Lung cancer in her father and maternal uncle; Multiple myeloma in her paternal grandmother; Other in her maternal uncle; Prostate cancer in her maternal uncle; Stomach cancer in her paternal aunt.   Allergies Allergies  Allergen Reactions  . Doxycycline Nausea Only    Weak, stomach  . Sulfa Antibiotics Rash     Home Medications  Prior to Admission medications   Medication Sig Start Date End Date Taking? Authorizing Provider  amLODipine (NORVASC) 5 MG tablet Take 5 tablets by mouth in the morning and at bedtime.  03/08/19  Yes Eber Hong, MD  brimonidine-timolol (COMBIGAN) 0.2-0.5 % ophthalmic solution Place 1 drop into the right eye every 12 (twelve) hours.   Yes [provider]  Cholecalciferol (VITAMIN D3) 2000 units capsule Take 2,000 Units daily by mouth.   Yes [provider]  clopidogrel (PLAVIX) 75 MG tablet Take 75 mg by mouth daily. 06/25/16  Yes [provider]  cycloSPORINE (RESTASIS) 0.05 % ophthalmic emulsion Place 1 drop into both eyes 2 (two) times daily.   Yes [provider]  escitalopram (LEXAPRO) 10 MG tablet Take 10 mg by mouth daily.  05/20/13  Yes Philmore Pali, NP  famotidine (PEPCID) 40 MG tablet Take 40 mg by mouth daily.   Yes [provider]  FeFum-FePoly-FA-B Cmp-C-Biot (FOLIVANE-PLUS) CAPS TAKE 1 CAPSULE BY MOUTH EVERY DAY IN THE MORNING Patient taking differently: Take 1 capsule by mouth daily.  12/20/19  Yes Curt Bears, MD  letrozole [facility]) 2.5 MG tablet TAKE 1 TABLET BY MOUTH EVERY DAY Patient taking differently: Take 2.5 mg by mouth daily.  03/08/19  Yes Curt Bears, MD  lisinopril (ZESTRIL) 20 MG tablet Take 20 mg by mouth daily. 02/01/20  Yes [provider]  Multiple Vitamins-Minerals (PRESERVISION AREDS 2) CAPS Take by mouth.   Yes [provider]  nitrofurantoin, macrocrystal-monohydrate, (MACROBID) 100 MG  capsule Take 1 capsule (100 mg total) by mouth 2 (  two) times daily. 02/07/20  Yes Curt Bears, MD  osimertinib mesylate (TAGRISSO) 80 MG tablet Take 1 tablet (80 mg total) by mouth daily. 01/07/20  Yes Heilingoetter, Cassandra L, PA-C  prochlorperazine (COMPAZINE) 10 MG tablet Take 1 tablet (10 mg total) by mouth every 6 (six) hours as needed for nausea or vomiting. 02/07/20  Yes Curt Bears, MD  feeding supplement, ENSURE ENLIVE, (ENSURE ENLIVE) LIQD Take 237 mLs by mouth 2 (two) times daily between meals. Patient not taking: Reported on 02/07/2020 06/25/18   Margie Billet A, PA-C  Multiple Vitamins-Iron (MULTIVITAMINS WITH IRON) TABS tablet Take 1 tablet by mouth daily. Patient not taking: Reported on 02/07/2020 06/25/18   Wellington Hampshire, PA-C     Signature:   Johnsie Cancel, NP-C [facility] Contact / Pager information can be found on Amion  02/11/2020, 10:00 AM

## 2020-02-11 NOTE — Progress Notes (Signed)
  Amiodarone Drug - Drug Interaction Consult Note  Recommendations: Amiodarone is metabolized by the cytochrome P450 system and therefore has the potential to cause many drug interactions. Amiodarone has an average plasma half-life of 50 days (range 20 to 100 days).   There is potential for drug interactions to occur several weeks or months after stopping treatment and the onset of drug interactions may be slow after initiating amiodarone.   []  Statins: Increased risk of myopathy. Simvastatin- restrict dose to 20mg  daily. Other statins: counsel patients to report any muscle pain or weakness immediately.  []  Anticoagulants: Amiodarone can increase anticoagulant effect. Consider warfarin dose reduction. Patients should be monitored closely and the dose of anticoagulant altered accordingly, remembering that amiodarone levels take several weeks to stabilize.  []  Antiepileptics: Amiodarone can increase plasma concentration of phenytoin, the dose should be reduced. Note that small changes in phenytoin dose can result in large changes in levels. Monitor patient and counsel on signs of toxicity.  []  Beta blockers: increased risk of bradycardia, AV block and myocardial depression. Sotalol - avoid concomitant use.  []   Calcium channel blockers (diltiazem and verapamil): increased risk of bradycardia, AV block and myocardial depression.  []   Cyclosporine: Amiodarone increases levels of cyclosporine. Reduced dose of cyclosporine is recommended.  []  Digoxin dose should be halved when amiodarone is started.  []  Diuretics: increased risk of cardiotoxicity if hypokalemia occurs.  []  Oral hypoglycemic agents (glyburide, glipizide, glimepiride): increased risk of hypoglycemia. Patient's glucose levels should be monitored closely when initiating amiodarone therapy.   [x]  Drugs that prolong the QT interval:  Torsades de pointes risk may be increased with concurrent use - avoid if possible.  Monitor QTc, also  keep magnesium/potassium WNL if concurrent therapy can't be avoided. Marland Kitchen Antibiotics: e.g. fluoroquinolones, erythromycin. . Antiarrhythmics: e.g. quinidine, procainamide, disopyramide, sotalol. . Antipsychotics: e.g. phenothiazines, haloperidol.  . Lithium, tricyclic antidepressants, and methadone. Thank You,  Nani Skillern Crowford  02/11/2020 1:02 AM

## 2020-02-11 NOTE — Progress Notes (Signed)
ANTICOAGULATION CONSULT NOTE - Follow Up Consult  Pharmacy Consult for Heparin Indication: pulmonary embolus  Allergies  Allergen Reactions  . Doxycycline Nausea Only    Weak, stomach  . Sulfa Antibiotics Rash    Patient Measurements: Height: 5\' 6"  (167.6 cm) Weight: 62.4 kg (137 lb 9.1 oz) IBW/kg (Calculated) : 59.3 Heparin Dosing Weight:   Vital Signs: Temp: 97.9 F (36.6 C) (04/23 0417) Temp Source: Oral (04/23 0417) BP: 112/57 (04/23 0545) Pulse Rate: 130 (04/23 0545)  Labs: Recent Labs    02/09/20 1723 02/09/20 1723 02/10/20 0003 02/10/20 0224 02/10/20 0332 02/10/20 0332 02/10/20 1001 02/10/20 1918 02/11/20 0043 02/11/20 0511  HGB 10.6*   < >  --   --  8.5*   < >  --   --  9.8* 8.3*  HCT 33.5*   < >  --   --  27.2*  --   --   --  31.1* 26.0*  PLT 165  --   --   --  123*  --   --   --   --  160  APTT 35  --   --   --   --   --   --   --   --   --   LABPROT 16.5*  --   --   --   --   --   --   --   --   --   INR 1.3*  --   --   --   --   --   --   --   --   --   HEPARINUNFRC  --   --   --   --   --   --  0.19* 0.27*  --  0.23*  CREATININE 1.21*   < >  --   --  0.97  --   --   --  1.03* 0.98  0.99  TROPONINIHS  --   --  681* 637*  --   --   --   --   --   --    < > = values in this interval not displayed.    Estimated Creatinine Clearance: 42.4 mL/min (by C-G formula based on SCr of 0.99 mg/dL).   Medications:  Infusions:  . sodium chloride Stopped (02/11/20 0020)  . amiodarone 60 mg/hr (02/11/20 0054)   Followed by  . amiodarone    . cefTRIAXone (ROCEPHIN)  IV Stopped (02/10/20 2222)  . diltiazem (CARDIZEM) infusion 10 mg/hr (02/11/20 0500)  . heparin 1,150 Units/hr (02/11/20 0500)    Assessment: Patient with low heparin level.  RN reports IV heparin had to be paused for total of ~14mins to give other needed meds.  An unknown amount of effect on the AM heparin level is expected.  No other heparin issues per RN.  Goal of Therapy:  Heparin level  0.3-0.7 units/ml Monitor platelets by anticoagulation protocol: Yes   Plan:  Increase heparin to 1250 units/hr Recheck level at [address] [place] 02/11/2020,5:54 AM

## 2020-02-11 NOTE — Progress Notes (Signed)
Called and spoke with Dr. Lucile Shutters and made him aware that after giving the 2nd dose of 5 mg Cardizem push patient's heart rate remains in 130's but did go down to 116 and that blood pressure has remained stable. MD stated he would order 10 mg Cardizem IV push.

## 2020-02-11 NOTE — Progress Notes (Signed)
ANTICOAGULATION CONSULT NOTE - Follow Up Consult  Pharmacy Consult for heparin>>xarelto Indication: acute pulmonary embolus  Allergies  Allergen Reactions  . Doxycycline Nausea Only    Weak, stomach  . Sulfa Antibiotics Rash    Patient Measurements: Height: 5\' 6"  (167.6 cm) Weight: 62.4 kg (137 lb 9.1 oz) IBW/kg (Calculated) : 59.3 Heparin Dosing Weight: 58 kg  Vital Signs: Temp: 97.3 F (36.3 C) (04/23 0800) Temp Source: Oral (04/23 0800) BP: 137/62 (04/23 1600) Pulse Rate: 115 (04/23 1600)  Labs: Recent Labs    02/09/20 1723 02/09/20 1723 02/10/20 0003 02/10/20 0224 02/10/20 0332 02/10/20 0332 02/10/20 1001 02/10/20 1918 02/11/20 0043 02/11/20 0511 02/11/20 1456  HGB 10.6*   < >  --   --  8.5*   < >  --   --  9.8* 8.3*  --   HCT 33.5*   < >  --   --  27.2*  --   --   --  31.1* 26.0*  --   PLT 165  --   --   --  123*  --   --   --   --  160  --   APTT 35  --   --   --   --   --   --   --   --   --   --   LABPROT 16.5*  --   --   --   --   --   --   --   --   --   --   INR 1.3*  --   --   --   --   --   --   --   --   --   --   HEPARINUNFRC  --   --   --   --   --   --    < > 0.27*  --  0.23* 0.29*  CREATININE 1.21*   < >  --   --  0.97  --   --   --  1.03* 0.98  0.99  --   TROPONINIHS  --   --  681* 637*  --   --   --   --   --   --   --    < > = values in this interval not displayed.    Estimated Creatinine Clearance: 42.4 mL/min (by C-G formula based on SCr of 0.99 mg/dL).  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  Chest CTA was positive for acute PE and left pleural effusion.  Patient's current on heparin drip for acute PE. Pharmacy now consulted to dose xarelto.  Today, 02/11/2020: Scr 0.99, CrCl~ 42.33mls/min Hgb 8.3 Plts 160   Goal of Therapy:  Heparin level 0.3-0.7 units/ml Monitor platelets by anticoagulation protocol:  Yes   Plan:  - stop heparin drip and give xarelto 15mg  po twice daily x 21 days then - xarelto 20 po once daily thereafter - Pharmacy to provide education  Dolly Rias RPh 02/11/2020, 4:21 PM

## 2020-02-11 NOTE — Progress Notes (Signed)
Called and spoke with Dr. Lucile Shutters and made him aware that after giving Cardizem push heart rate went down to 118 but remains in 130's at this time and that blood pressure remained stable after Cardizem push. MD stated he would either order another Cardizem push or start a drip.

## 2020-02-12 ENCOUNTER — Inpatient Hospital Stay (HOSPITAL_COMMUNITY): Payer: Medicare Other

## 2020-02-12 DIAGNOSIS — J9600 Acute respiratory failure, unspecified whether with hypoxia or hypercapnia: Secondary | ICD-10-CM | POA: Diagnosis not present

## 2020-02-12 DIAGNOSIS — I4819 Other persistent atrial fibrillation: Secondary | ICD-10-CM

## 2020-02-12 DIAGNOSIS — N390 Urinary tract infection, site not specified: Secondary | ICD-10-CM | POA: Diagnosis not present

## 2020-02-12 DIAGNOSIS — C3490 Malignant neoplasm of unspecified part of unspecified bronchus or lung: Secondary | ICD-10-CM | POA: Diagnosis not present

## 2020-02-12 DIAGNOSIS — I2699 Other pulmonary embolism without acute cor pulmonale: Secondary | ICD-10-CM | POA: Diagnosis not present

## 2020-02-12 DIAGNOSIS — J9601 Acute respiratory failure with hypoxia: Secondary | ICD-10-CM | POA: Diagnosis not present

## 2020-02-12 LAB — BASIC METABOLIC PANEL
Anion gap: 10 (ref 5–15)
BUN: 36 mg/dL — ABNORMAL HIGH (ref 8–23)
CO2: 21 mmol/L — ABNORMAL LOW (ref 22–32)
Calcium: 9.4 mg/dL (ref 8.9–10.3)
Chloride: 103 mmol/L (ref 98–111)
Creatinine, Ser: 1.01 mg/dL — ABNORMAL HIGH (ref 0.44–1.00)
GFR calc Af Amer: >60 mL/min (ref >60)
GFR calc non Af Amer: 53 mL/min — ABNORMAL LOW (ref >60)
Glucose, Bld: 122 mg/dL — ABNORMAL HIGH (ref 70–99)
Potassium: 4.6 mmol/L (ref 3.5–5.1)
Sodium: 134 mmol/L — ABNORMAL LOW (ref 135–145)

## 2020-02-12 LAB — HEPATITIS PANEL, ACUTE
HCV Ab: NONREACTIVE
Hep A IgM: NONREACTIVE
Hep B C IgM: NONREACTIVE
Hepatitis B Surface Ag: NONREACTIVE

## 2020-02-12 LAB — CBC
HCT: 28.3 % — ABNORMAL LOW (ref 36.0–46.0)
Hemoglobin: 9.3 g/dL — ABNORMAL LOW (ref 12.0–15.0)
MCH: 28.9 pg (ref 26.0–34.0)
MCHC: 32.9 g/dL (ref 30.0–36.0)
MCV: 87.9 fL (ref 80.0–100.0)
Platelets: 182 10*3/uL (ref 150–400)
RBC: 3.22 MIL/uL — ABNORMAL LOW (ref 3.87–5.11)
RDW: 13.3 % (ref 11.5–15.5)
WBC: 14.4 10*3/uL — ABNORMAL HIGH (ref 4.0–10.5)
nRBC: 0.1 % (ref 0.0–0.2)

## 2020-02-12 LAB — T3, FREE: T3, Free: 2.4 pg/mL (ref 2.0–4.4)

## 2020-02-12 MED ORDER — FUROSEMIDE 10 MG/ML IJ SOLN
20.0000 mg | Freq: Once | INTRAMUSCULAR | Status: AC
  Administered 2020-02-12: 17:00:00 20 mg via INTRAVENOUS
  Filled 2020-02-12: qty 2

## 2020-02-12 NOTE — Progress Notes (Signed)
[facility]                                                                              Patient Demographics  Kristin [name], is a 79 y.o. female, DOB - Jan 18, 1941, FYB:[id number]  Admit date - 02/09/2020   Admitting Physician Rise Patience, MD  Outpatient Primary MD for the patient is Eber Hong, MD  Outpatient specialists:   LOS - 2  days   Medical records reviewed and are as summarized below:    Chief Complaint  Patient presents with  . Shortness of Breath  . Weakness       Brief summary   Kristin [name] a 79 y.o.femalewithhistory of metastatic non-small cell lung cancer with history of colon cancer status post colostomy and history of breast cancer with history of hypertension CVA has been experiencing increasing shortness of breath and weakness over the last few days with increasing weight loss had followed up with her oncologist 3 days ago at that time patient was placed on fluids and empirically started on Macrobid. Despite which patient is still having increasing weakness and the last 2 days had fevers noted at home with temperature 101 F. Which prompted patient to come to the ER.  On admission, creatinine 1.2, elevated alk phos, leukocytosis, hemoglobin 10.6, BNP elevated 717.3.  Patient met criteria for SIRS, work-up revealed acute PE, large left pleural effusion. Underwent thoracentesis on 4/22, 950 cc of bloody pleural fluid removed PCCM following.  Developed persistent A. fib with RVR on 4/23, was placed on IV amiodarone and Cardizem, cardiology was consulted   Assessment & Plan    Principal Problem:   Acute right lower lobe pulmonary embolism ([place]), acute LLE DVT -In the setting of known malignancies. -CTA chest showed right lower lobe segmental pulmonary emboli, minimal clot burden, partially loculated left pleural effusion, measuring> 1 L, continued areas of pleural thickening suspicious for recurrent disease -Lower  extremity venous Doppler showed acute DVT involving the left posterior tibial vein, left peroneal vein -2D echo showed EF of 50 to 52%, grade 1 diastolic dysfunction, RV EF moderately reduced with RV volume overload -Initially placed on IV heparin drip, now transitioned to oral Xarelto  Active Problems: Loculated left pleural effusion -Status post thoracentesis by pulmonology on 4/22, 950 cc of bloody pleural fluid removed -Fluid cultures negative, cytology pending   Atrial fibrillation with RVR, paroxysmal -Patient was placed on amiodarone drip, diltiazem drip, heparin drip -Converted to normal sinus rhythm, cardiology following, recommended continue IV amiodarone drip today and switch to oral tomorrow if still in NSR -Continue Xarelto  Acute on chronic diastolic CHF -Likely precipitated due to acute PE and A. fib with RVR -Management per cardiology  E. coli UTI -Urine culture showed E. coli, on Rocephin -Met SIRS criteria at the time of admission  Essential hypertension Continue lisinopril, amlodipine  CKD stage IIIa -Creatinine currently at baseline   Normocytic anemia -Likely related to malignancy -H&H currently stable  NSCLC of the lung, history of adenocarcinoma, status post colostomy History of breast CA -Followed by oncology, Dr. Julien Nordmann  Elevated LFTs -AST/ALT/alk phos slightly up.  CT renal showed GB  with sludge -Hepatitis panel pending  Urinary retention -Foley  Hx of breast CA  Moderate to severe protein calorie malnutrition Estimated body mass index is 22.81 kg/m as calculated from the following:   Height as of this encounter: 5' 6"  (1.676 m).   Weight as of this encounter: 64.1 kg.  Code Status: Full CODE STATUS DVT Prophylaxis: Xarelto Family Communication: Discussed all imaging results, lab results, explained to the patient   Disposition Plan:     Status is: Inpatient  Remains inpatient appropriate because:IV treatments appropriate  due to intensity of illness or inability to take PO   Dispo: The patient is from: Home              Anticipated d/c is to: Home              Anticipated d/c date is: 2 days              Patient currently is not medically stable to d/c.   Time Spent in minutes   35 minutes  Procedures:  Thoracentesis  Consultants:   Cardiology Pulmonology  Antimicrobials:   Anti-infectives (From admission, onward)   Start     Dose/Rate Route Frequency Ordered Stop   02/10/20 0030  cefTRIAXone (ROCEPHIN) 1 g in sodium chloride 0.9 % 100 mL IVPB     1 g 200 mL/hr over 30 Minutes Intravenous Daily at bedtime 02/10/20 0003     02/09/20 1730  vancomycin (VANCOCIN) IVPB 1000 mg/200 mL premix  Status:  Discontinued     1,000 mg 200 mL/hr over 60 Minutes Intravenous  Once 02/09/20 1723 02/09/20 1724   02/09/20 1730  piperacillin-tazobactam (ZOSYN) IVPB 3.375 g     3.375 g 100 mL/hr over 30 Minutes Intravenous  Once 02/09/20 1723 02/09/20 1818   02/09/20 1730  vancomycin (VANCOREADY) IVPB 1250 mg/250 mL     1,250 mg 166.7 mL/hr over 90 Minutes Intravenous  Once 02/09/20 1724 02/09/20 2001          Medications  Scheduled Meds: . amLODipine  5 mg Oral Daily  . brimonidine  1 drop Right Eye BID   And  . timolol  1 drop Right Eye BID  . Chlorhexidine Gluconate Cloth  6 each Topical Daily  . cholecalciferol  2,000 Units Oral Daily  . clopidogrel  75 mg Oral Daily  . cycloSPORINE  1 drop Both Eyes BID  . escitalopram  10 mg Oral Daily  . famotidine  20 mg Oral Daily  . feeding supplement  1 Container Oral BID BM  . feeding supplement (ENSURE ENLIVE)  237 mL Oral BID BM  . letrozole  2.5 mg Oral Daily  . lisinopril  20 mg Oral Daily  . mouth rinse  15 mL Mouth Rinse BID  . multivitamin with minerals  1 tablet Oral Daily  . osimertinib mesylate  80 mg Oral Daily  . pantoprazole  40 mg Oral QAC breakfast  . psyllium  1 packet Oral Daily  . Rivaroxaban  15 mg Oral BID WC   Followed by  .  [START ON 03/03/2020] rivaroxaban  20 mg Oral Q supper   Continuous Infusions: . sodium chloride Stopped (02/11/20 0020)  . amiodarone 30 mg/hr (02/12/20 0900)  . cefTRIAXone (ROCEPHIN)  IV Stopped (02/11/20 2335)   PRN Meds:.ALPRAZolam, ondansetron **OR** ondansetron (ZOFRAN) IV, oxyCODONE-acetaminophen      Subjective:   Mabelle Mungin was seen and examined today.  No acute complaints.  Denies any chest pain.  Converted  to normal sinus rhythm.  No acute shortness of breath, improving. Patient denies dizziness, abdominal pain, N/V/D/C, new weakness, numbess, tingling. No acute events overnight.    Objective:   Vitals:   02/12/20 0600 02/12/20 0700 02/12/20 0840 02/12/20 0900  BP: (!) 152/69 (!) 165/68  (!) 169/71  Pulse: 95 96  97  Resp: (!) 31 (!) 37  (!) 29  Temp:   98.3 F (36.8 C)   TempSrc:   Oral   SpO2: 96% 96%  95%  Weight:      Height:        Intake/Output Summary (Last 24 hours) at 02/12/2020 1043 Last data filed at 02/12/2020 0900 Gross per 24 hour  Intake 721.49 ml  Output 1010 ml  Net -288.51 ml     Wt Readings from Last 3 Encounters:  02/12/20 64.1 kg  02/07/20 58.1 kg  12/27/19 60.6 kg     Exam  General: Alert and oriented x 3, NAD  Cardiovascular: S1 S2 auscultated, no murmurs, RRR  Respiratory: Decreased breath sounds Left >Rt  Gastrointestinal: Soft, nontender, nondistended, + bowel sounds  Ext: no pedal edema bilaterally  Neuro: No new deficits  Musculoskeletal: No digital cyanosis, clubbing  Skin: No rashes  Psych: Normal affect and demeanor, alert and oriented x3    Data Reviewed:  I have personally reviewed following labs and imaging studies  Micro Results Recent Results (from the past 240 hour(s))  Blood Culture (routine x 2)     Status: None (Preliminary result)   Collection Time: 02/09/20  5:33 PM   Specimen: BLOOD RIGHT FOREARM  Result Value Ref Range Status   Specimen Description   Final    BLOOD RIGHT  FOREARM Performed at [facility], [address], [place], [place] [address]    Special Requests   Final    BOTTLES DRAWN AEROBIC AND ANAEROBIC Blood Culture results may not be optimal due to an inadequate volume of blood received in culture bottles Performed at [facility] [address], [place], [place] [address]    Culture   Final    NO GROWTH 2 DAYS Performed at [facility] [address], [place], [place] [address]    Report Status PENDING  Incomplete  Blood Culture (routine x 2)     Status: None (Preliminary result)   Collection Time: 02/09/20  5:33 PM   Specimen: BLOOD  Result Value Ref Range Status   Specimen Description   Final    BLOOD RIGHT ANTECUBITAL Performed at [facility] [address] [place], [place] [address]    Special Requests   Final    BOTTLES DRAWN AEROBIC AND ANAEROBIC Blood Culture results may not be optimal due to an inadequate volume of blood received in culture bottles Performed at [facility] [address], [place], [place] [address]    Culture   Final    NO GROWTH 2 DAYS Performed at [facility] [address], [place], [place] [address]    Report Status PENDING  Incomplete  Urine culture     Status: Abnormal   Collection Time: 02/09/20  8:36 PM   Specimen: In/Out Cath Urine  Result Value Ref Range Status   Specimen Description   Final    IN/OUT CATH URINE Performed at [facility] [address], [place],  [address]    Special Requests   Final    NONE Performed at [facility], [place] [address], [place], [place] [address]    Culture 5,000 COLONIES/mL ESCHERICHIA COLI (A)  Final   Report Status 02/11/2020 FINAL  Final   Organism ID, Bacteria ESCHERICHIA COLI (A)  Final      Susceptibility   Escherichia coli - MIC*    AMPICILLIN 8 SENSITIVE Sensitive     CEFAZOLIN <=4 SENSITIVE Sensitive      CEFTRIAXONE <=1 SENSITIVE Sensitive     CIPROFLOXACIN >=4 RESISTANT Resistant     GENTAMICIN >=16 RESISTANT Resistant     IMIPENEM <=0.25 SENSITIVE Sensitive     NITROFURANTOIN <=16 SENSITIVE Sensitive     TRIMETH/SULFA <=20 SENSITIVE Sensitive     AMPICILLIN/SULBACTAM 4 SENSITIVE Sensitive     PIP/TAZO <=4 SENSITIVE Sensitive     * 5,000 COLONIES/mL ESCHERICHIA COLI  SARS CORONAVIRUS 2 (TAT 6-24 HRS) Nasopharyngeal Nasopharyngeal Swab     Status: None   Collection Time: 02/10/20  3:12 AM   Specimen: Nasopharyngeal Swab  Result Value Ref Range Status   SARS Coronavirus 2 NEGATIVE NEGATIVE Final    Comment: (NOTE) SARS-CoV-2 target nucleic acids are NOT DETECTED. The SARS-CoV-2 RNA is generally detectable in upper and lower respiratory specimens during the acute phase of infection. Negative results do not preclude SARS-CoV-2 infection, do not rule out co-infections with other pathogens, and should not be used as the sole basis for treatment or other patient management decisions. Negative results must be combined with clinical observations, patient history, and epidemiological information. The expected result is Negative. Fact Sheet for Patients: SugarRoll.be Fact Sheet for Healthcare Providers: https://www.woods-mathews.com/ This test is not yet approved or cleared by the [place] FDA and  has been authorized for detection and/or diagnosis of SARS-CoV-2 by FDA under an Emergency Use Authorization (EUA). This EUA will remain  in effect (meaning this test can be used) for the duration of the COVID-19 declaration under Section 56 4(b)(1) of the Act, 21 U.S.C. section 360bbb-3(b)(1), unless the authorization is terminated or revoked sooner. Performed at [facility], [place] [address], [place], [place] [address]   MRSA PCR Screening     Status: None   Collection Time: 02/10/20  3:32 PM   Specimen: Nasal Mucosa; Nasopharyngeal  Result  Value Ref Range Status   MRSA by PCR NEGATIVE NEGATIVE Final    Comment:        The GeneXpert MRSA Assay (FDA approved for NASAL specimens only), is one component of a comprehensive MRSA colonization surveillance program. It is not intended to diagnose MRSA infection nor to guide or monitor treatment for MRSA infections. Performed at [facility], [place] [address] [place], [place] [address]   Body fluid culture (includes gram stain)     Status: None (Preliminary result)   Collection Time: 02/10/20  6:23 PM   Specimen: Pleural Fluid  Result Value Ref Range Status   Specimen Description   Final    PLEURAL LEFT Performed at [facility] [address], [place], [place] [address]    Special Requests   Final    Normal Performed at [facility], [place] [address], [place], [place] [address]    Gram Stain   Final    FEW WBC PRESENT, PREDOMINANTLY MONONUCLEAR NO ORGANISMS SEEN    Culture   Final    NO GROWTH 2 DAYS Performed at [facility] [address] [place], [place] [address]    Report Status PENDING  Incomplete    Radiology  Reports CT ANGIO CHEST PE W OR WO CONTRAST  Result Date: 02/09/2020 CLINICAL DATA:  Shortness of breath, weakness, left upper lobe lung cancer EXAM: CT ANGIOGRAPHY CHEST WITH CONTRAST TECHNIQUE: Multidetector CT imaging of the chest was performed using the standard protocol during bolus administration of intravenous contrast. Multiplanar CT image reconstructions and MIPs were obtained to evaluate the vascular anatomy. CONTRAST:  62m OMNIPAQUE IOHEXOL 350 MG/ML SOLN COMPARISON:  12/24/2019 FINDINGS: Cardiovascular: This is a technically adequate evaluation of the pulmonary vasculature. There are segmental right lower lobe pulmonary emboli. Clot burden is minimal. No evidence of right heart strain. No pericardial effusion. Thoracic aorta is normal in caliber with no aneurysm or dissection.  Mediastinum/Nodes: No pathologic adenopathy. There is a large hiatal hernia. Esophagus, trachea, and thyroid are stable. Lungs/Pleura: Since the previous exam, there has been significant enlargement of the left pleural effusion now measuring greater than 1 L in volume. The effusion is partially loculated, with continued areas of pleural thickening. There is dense left lower lobe consolidation consistent with atelectasis. There is a trace right pleural effusion. Patchy hypoventilatory changes are seen at the right lung base. No pneumothorax. Central airways patent. Upper Abdomen: Stable right lobe liver cyst. Remainder of the upper abdomen is unremarkable. Musculoskeletal: No acute or destructive bony lesions. Reconstructed images demonstrate no additional findings. Review of the MIP images confirms the above findings. IMPRESSION: 1. Right lower lobe segmental pulmonary emboli. Clot burden is minimal. 2. Significant interval enlargement of the partially loculated left pleural effusion, now measuring greater than 1 L in volume. Continued areas of pleural thickening suspicious for recurrent disease. 3. Trace right pleural effusion. 4. Hiatal hernia. These results were called by telephone at the time of interpretation on 02/09/2020 at 11:40 pm to provider [facility], who verbally acknowledged these results. Electronically Signed   By: MRanda NgoM.D.   On: 02/09/2020 23:40   DG Chest Port 1 View  Result Date: 02/11/2020 CLINICAL DATA:  Respiratory failure EXAM: PORTABLE CHEST 1 VIEW COMPARISON:  02/10/2020 FINDINGS: Cardiomegaly with vascular congestion. Small left pleural effusion. Left lower lobe atelectasis. Bilateral interstitial prominence may reflect mild interstitial edema. IMPRESSION: Cardiomegaly, suspect mild interstitial edema. Small left effusion with left base atelectasis. Electronically Signed   By: KRolm BaptiseM.D.   On: 02/11/2020 02:22   DG CHEST PORT 1 VIEW  Result Date:  02/10/2020 CLINICAL DATA:  Post left thoracentesis EXAM: PORTABLE CHEST 1 VIEW COMPARISON:  02/09/2020 FINDINGS: Interval decrease in the left effusion following thoracentesis. Small left effusion remains. No pneumothorax. Left base atelectasis. Mild cardiomegaly. Right lung clear. IMPRESSION: Decreasing left effusion following thoracentesis.  No pneumothorax. Small left effusion and left base atelectasis. Electronically Signed   By: KRolm BaptiseM.D.   On: 02/10/2020 19:13   DG Chest Port 1 View  Result Date: 02/09/2020 CLINICAL DATA:  Short of breath EXAM: PORTABLE CHEST 1 VIEW COMPARISON:  06/24/2018, CT 12/24/2019 FINDINGS: Postoperative changes in the left upper lung. Moderate left pleural effusion increased compared to prior. Probable trace right effusion. Airspace disease at the left base. Obscured cardiomediastinal silhouette. Hiatal hernia. Convex opacity over the lower mediastinal silhouette. IMPRESSION: 1. Postsurgical changes of the left chest. Moderate left pleural effusion, increased compared to prior. Probable trace right pleural effusion. Airspace disease at the left lung base. Electronically Signed   By: KDonavan FoilM.D.   On: 02/09/2020 18:07   ECHOCARDIOGRAM COMPLETE  Result Date: 02/10/2020    ECHOCARDIOGRAM REPORT   Patient Name:  Sarena Mandujano Date of Exam: 02/10/2020 Medical Rec #:  [id number]       Height:       66.0 in Accession #:    [id number]      Weight:       127.9 lb Date of Birth:  1940-10-28       BSA:          1.654 m Patient Age:    50 years        BP:           111/58 mmHg Patient Gender: F               HR:           88 bpm. Exam Location:  Inpatient Procedure: 2D Echo and Intracardiac Opacification Agent Indications:    Pulmonary Embolus 415.19 / I26.99  History:        Patient has prior history of Echocardiogram examinations, most                 recent 02/01/2016. Risk Factors:Hypertension. CKD                 Pleural effusion.  Sonographer:    Vikki Ports Turrentine  Referring Phys: 76 Rise Patience  Sonographer Comments: Technically difficult study due to poor echo windows. Image acquisition challenging due to patient body habitus. IMPRESSIONS  1. Left ventricular ejection fraction, by estimation, is 50 to 55%. The left ventricle has low normal function. The left ventricle has no regional wall motion abnormalities. Left ventricular diastolic parameters are consistent with Grade I diastolic dysfunction (impaired relaxation).  2. Right ventricular systolic function is moderately reduced. The right ventricular size is moderately enlarged. D-shaped septum suggestive of RV pressure/volume overload. No complete TR doppler jet so unable to estimate PA systolic pressure.  3. The aortic valve is tricuspid. Aortic valve regurgitation is trivial. No aortic stenosis is present.  4. The mitral valve is normal in structure. No evidence of mitral valve regurgitation. No evidence of mitral stenosis.  5. The inferior vena cava is dilated in size with <50% respiratory variability, suggesting right atrial pressure of 15 mmHg.  6. Left-sided pleural effusion noted. Trivial pericardial effusion. FINDINGS  Left Ventricle: Left ventricular ejection fraction, by estimation, is 50 to 55%. The left ventricle has low normal function. The left ventricle has no regional wall motion abnormalities. Definity contrast agent was given IV to delineate the left ventricular endocardial borders. The left ventricular internal cavity size was normal in size. There is no left ventricular hypertrophy. Left ventricular diastolic parameters are consistent with Grade I diastolic dysfunction (impaired relaxation). Right Ventricle: The right ventricular size is moderately enlarged. No increase in right ventricular wall thickness. Right ventricular systolic function is moderately reduced. Tricuspid regurgitation signal is inadequate for assessing PA pressure. Left Atrium: Left atrial size was normal in size. Right  Atrium: Right atrial size was normal in size. Pericardium: Trivial pericardial effusion is present. Mitral Valve: The mitral valve is normal in structure. No evidence of mitral valve regurgitation. No evidence of mitral valve stenosis. Tricuspid Valve: The tricuspid valve is normal in structure. Tricuspid valve regurgitation is not demonstrated. Aortic Valve: The aortic valve is tricuspid. Aortic valve regurgitation is trivial. No aortic stenosis is present. Pulmonic Valve: The pulmonic valve was normal in structure. Pulmonic valve regurgitation is not visualized. Aorta: The aortic root is normal in size and structure. Venous: The inferior vena cava is dilated in size with less than 50% respiratory  variability, suggesting right atrial pressure of 15 mmHg. IAS/Shunts: No atrial level shunt detected by color flow Doppler.  LEFT VENTRICLE PLAX 2D LVOT diam:     1.80 cm  Diastology LV SV:         36       LV e' lateral:   9.36 cm/s LV SV Index:   22       LV E/e' lateral: 3.6 LVOT Area:     2.54 cm LV e' medial:    7.83 cm/s                         LV E/e' medial:  4.3  LEFT ATRIUM           Index LA Vol (A4C): 36.2 ml 21.89 ml/m  AORTIC VALVE LVOT Vmax:   84.90 cm/s LVOT Vmean:  61.100 cm/s LVOT VTI:    0.141 m MITRAL VALVE MV Area (PHT): 3.37 cm    SHUNTS MV Decel Time: 225 msec    Systemic VTI:  0.14 m MV E velocity: 33.40 cm/s  Systemic Diam: 1.80 cm MV A velocity: 59.70 cm/s MV E/A ratio:  0.56 Loralie Champagne MD Electronically signed by Loralie Champagne MD Signature Date/Time: 02/10/2020/4:49:30 PM    Final    CT RENAL STONE STUDY  Result Date: 02/09/2020 CLINICAL DATA:  Flank pain, kidney stone suspected. EXAM: CT ABDOMEN AND PELVIS WITHOUT CONTRAST TECHNIQUE: Multidetector CT imaging of the abdomen and pelvis was performed following the standard protocol without IV contrast. COMPARISON:  Abdominal CT 12/24/2019. Concurrent chest CTA, reported separately. FINDINGS: Lower chest: Left pleural effusion and basilar  consolidation assessed on concurrent chest CT, reported separately. Moderately large hiatal hernia, partially obscured by motion artifact. Hepatobiliary: Cyst in the right lobe of the liver is grossly stable, motion artifact partially obscures evaluation. There is some high-density material within the gallbladder that may represent sludge. More detailed evaluation is limited given motion. No biliary dilatation. Pancreas: Motion artifact through the pancreas limits assessment. No ductal dilatation. No obvious peripancreatic inflammation Spleen: Normal in size with small cleft medially. Adrenals/Urinary Tract: No adrenal nodule. No hydronephrosis. No renal or ureteral calculi. Mild bilateral renal parenchymal thinning and cortical scarring in the lower right kidney. There is symmetric bilateral perinephric edema. Urinary bladder is unremarkable, no bladder stone. Stomach/Bowel: Moderately large hiatal hernia. Motion limits more detailed assessment. No evidence of bowel obstruction. No obvious bowel inflammation, lack of contrast and motion limits detailed bowel assessment. Descending colostomy. Mild descending colonic diverticulosis without diverticulitis chain sutures noted in the cecum. Vascular/Lymphatic: Aorto bi-iliac atherosclerosis. No grossly enlarged abdominopelvic lymph nodes, detailed assessment limited given motion and lack of contrast. Reproductive: Calcified uterine fibroids. No adnexal mass. Other: Mild generalized body wall edema. No ascites. No free air. Musculoskeletal: Left hip arthroplasty. Bones are under mineralized. Stable bone island in the mid sacrum. Multilevel degenerative change in the lumbar spine. IMPRESSION: 1. Motion limited exam. 2. No renal stones or obstructive uropathy. Symmetric bilateral perinephric edema is nonspecific. 3. Moderately large hiatal hernia. 4. High-density material within the gallbladder may represent sludge. More detailed evaluation is limited given motion  artifact. Aortic Atherosclerosis (ICD10-I70.0). Electronically Signed   By: Keith Rake M.D.   On: 02/09/2020 23:30   VAS [place] LOWER EXTREMITY VENOUS (DVT)  Result Date: 02/11/2020  Lower Venous DVTStudy Indications: Pulmonary embolism.  Performing Technologist: June Leap RDMS, RVT  Examination Guidelines: A complete evaluation includes B-mode imaging, spectral Doppler, color Doppler, and  power Doppler as needed of all accessible portions of each vessel. Bilateral testing is considered an integral part of a complete examination. Limited examinations for reoccurring indications may be performed as noted. The reflux portion of the exam is performed with the patient in reverse Trendelenburg.  +---------+---------------+---------+-----------+----------+--------------+ RIGHT    CompressibilityPhasicitySpontaneityPropertiesThrombus Aging +---------+---------------+---------+-----------+----------+--------------+ CFV      Full           Yes      Yes                                 +---------+---------------+---------+-----------+----------+--------------+ SFJ      Full                                                        +---------+---------------+---------+-----------+----------+--------------+ FV Prox  Full                                                        +---------+---------------+---------+-----------+----------+--------------+ FV Mid   Full                                                        +---------+---------------+---------+-----------+----------+--------------+ FV DistalFull                                                        +---------+---------------+---------+-----------+----------+--------------+ PFV      Full                                                        +---------+---------------+---------+-----------+----------+--------------+ POP      Full           Yes      Yes                                  +---------+---------------+---------+-----------+----------+--------------+ PTV      Full                                                        +---------+---------------+---------+-----------+----------+--------------+ PERO     Full                                                        +---------+---------------+---------+-----------+----------+--------------+   +---------+---------------+---------+-----------+----------+--------------+  LEFT     CompressibilityPhasicitySpontaneityPropertiesThrombus Aging +---------+---------------+---------+-----------+----------+--------------+ CFV      Full           Yes      Yes                                 +---------+---------------+---------+-----------+----------+--------------+ SFJ      Full                                                        +---------+---------------+---------+-----------+----------+--------------+ FV Prox  Full                                                        +---------+---------------+---------+-----------+----------+--------------+ FV Mid   Full                                                        +---------+---------------+---------+-----------+----------+--------------+ FV DistalFull                                                        +---------+---------------+---------+-----------+----------+--------------+ PFV      Full                                                        +---------+---------------+---------+-----------+----------+--------------+ POP      Full           Yes      Yes                                 +---------+---------------+---------+-----------+----------+--------------+ PTV      None                                                        +---------+---------------+---------+-----------+----------+--------------+ PERO     None                                                         +---------+---------------+---------+-----------+----------+--------------+     Summary: RIGHT: - There is no evidence of deep vein thrombosis in the lower extremity.  - No cystic structure found in the popliteal fossa.  LEFT: - Findings consistent with acute deep vein thrombosis involving the left posterior tibial veins, and left  peroneal veins. - No cystic structure found in the popliteal fossa.  *See table(s) above for measurements and observations. Electronically signed by Harold Barban MD on 02/11/2020 at 5:06:34 PM.    Final     Lab Data:  CBC: Recent Labs  Lab 02/07/20 1021 02/07/20 1021 02/09/20 1723 02/10/20 0332 02/11/20 0043 02/11/20 0511 02/12/20 0259  WBC 11.5*  --  12.1* 16.7*  --  12.1* 14.4*  NEUTROABS 9.2*  --  9.6*  --   --   --   --   HGB 11.7*  --  10.6* 8.5* 9.8* 8.3* 9.3*  HCT 36.3   < > 33.5* 27.2* 31.1* 26.0* 28.3*  MCV 90.5  --  91.0 91.9  --  91.5 87.9  PLT 231  --  165 123*  --  160 182   < > = values in this interval not displayed.   Basic Metabolic Panel: Recent Labs  Lab 02/09/20 1723 02/10/20 0332 02/11/20 0043 02/11/20 0511 02/12/20 0259  NA 135 139 135 135  136 134*  K 4.7 4.7 4.8 4.6  4.6 4.6  CL 102 109 105 104  106 103  CO2 22 21* 18* 20*  21* 21*  GLUCOSE 182* 169* 158* 143*  144* 122*  BUN 28* 25* 30* 33*  35* 36*  CREATININE 1.21* 0.97 1.03* 0.98  0.99 1.01*  CALCIUM 9.6 9.1 9.3 9.5  9.6 9.4  MG  --   --  2.1  --   --    GFR: Estimated Creatinine Clearance: 41.6 mL/min (A) (by C-G formula based on SCr of 1.01 mg/dL (H)). Liver Function Tests: Recent Labs  Lab 02/07/20 1021 02/09/20 1723 02/11/20 0511  AST 25 55* 69*  67*  ALT 27 58* 71*  69*  ALKPHOS 139* 186* 138*  133*  BILITOT 0.5 0.7 0.6  0.5  PROT 7.1 6.6 6.0*  5.9*  ALBUMIN 3.1* 2.9* 3.2*  3.1*   No results for input(s): LIPASE, AMYLASE in the last 168 hours. No results for input(s): AMMONIA in the last 168 hours. Coagulation Profile: Recent Labs    Lab 02/09/20 1723  INR 1.3*   Cardiac Enzymes: No results for input(s): CKTOTAL, CKMB, CKMBINDEX, TROPONINI in the last 168 hours. BNP (last 3 results) No results for input(s): PROBNP in the last 8760 hours. HbA1C: No results for input(s): HGBA1C in the last 72 hours. CBG: No results for input(s): GLUCAP in the last 168 hours. Lipid Profile: No results for input(s): CHOL, HDL, LDLCALC, TRIG, CHOLHDL, LDLDIRECT in the last 72 hours. Thyroid Function Tests: Recent Labs    02/11/20 0511 02/11/20 1859  TSH 0.015*  0.016*  --   FREET4 1.49*  --   T3FREE  --  2.4   Anemia Panel: No results for input(s): VITAMINB12, FOLATE, FERRITIN, TIBC, IRON, RETICCTPCT in the last 72 hours. Urine analysis:    Component Value Date/Time   COLORURINE YELLOW 02/09/2020 2036   APPEARANCEUR HAZY (A) 02/09/2020 2036   LABSPEC 1.031 (H) 02/09/2020 2036   PHURINE 5.0 02/09/2020 2036   GLUCOSEU NEGATIVE 02/09/2020 2036   HGBUR NEGATIVE 02/09/2020 2036   BILIRUBINUR NEGATIVE 02/09/2020 2036   KETONESUR NEGATIVE 02/09/2020 2036   PROTEINUR 100 (A) 02/09/2020 2036   UROBILINOGEN 0.2 04/29/2013 2243   NITRITE NEGATIVE 02/09/2020 2036   LEUKOCYTESUR LARGE (A) 02/09/2020 2036     Dontre Laduca M.D. [facility] 02/12/2020, 10:43 AM   Call night coverage person covering after 7pm

## 2020-02-12 NOTE — Progress Notes (Signed)
NAME:  Kristin [name], MRN:  [id number], DOB:  08/21/1941, LOS: 2 ADMISSION DATE:  02/09/2020, CONSULTATION DATE:  02/10/2020 REFERRING MD:  Dr. Reesa Chew, CHIEF COMPLAINT:  Fever and shortness of breath   Brief History   79yo female with extensive PMH who presented with complaint of shortness of breath, weakness, weight loss, and fever. Found to have large pleural effusion and PE.   History of present illness   Kristin [name] is a 79 yo female with PMH significant for metastatic non-small cell lung cancer, colon cancer s/p colostomy, breast cancer, hypertension, prior CVA, and anxiety who presented to the ED due to complaints of shortness of breath, weakness, fever, and weight loss. Per patient she was seen by oncologist 3 days prior to admission for follow up where she received IV fluids and empirically started on Macrobid for presumed UTI. She additionally reports left sided back/falnk pain. Patient continued to feel poor with eventually development of fever of 103 which prompted presentation to the ER.   On admission lab works significant for Creatinine 1.21 (baseline 0.9-1.0), elevated alkaline phos of 182, elevated WBC of 12.1, hgb 10.6 elevated BNP of 717.3. Vitals on admission significant for tachypnea and mild tachycardia meeting criteria for SIRS. Workup thus far reveals acute PE with CT evidence of RV strain as well as significant enlargement large left pleural effusion.   PCCM was consulted for assistance in management of pleural effusion and PE Given SIRS criteria with presence of left pleural effusion patient needs diagnostic thoracentesis to rule out empyema.   Past Medical History  Anemia of chronic disease Breast cancer Colon cancer NSCL cancer  CVA Hypertension  Significant Hospital Events   Admitted 4/22  Consults:    Procedures:  Left thora 4/22 > 961m blood pleural fluid removed   Significant Diagnostic Tests:  CTA chest 4/22 >  evidence of RV strain as well as  significant enlargement large left pleural effusion.   ECHO 4/22 > 1. Left ventricular ejection fraction, by estimation, is 50 to 55%. The  left ventricle has low normal function. The left ventricle has no regional  wall motion abnormalities. Left ventricular diastolic parameters are  consistent with Grade I diastolic  dysfunction (impaired relaxation).  2. Right ventricular systolic function is moderately reduced. The right  ventricular size is moderately enlarged. D-shaped septum suggestive of RV  pressure/volume overload. No complete TR doppler jet so unable to estimate  PA systolic pressure.  3. The aortic valve is tricuspid. Aortic valve regurgitation is trivial.  No aortic stenosis is present.  4. The mitral valve is normal in structure. No evidence of mitral valve  regurgitation. No evidence of mitral stenosis.  5. The inferior vena cava is dilated in size with <50% respiratory  variability, suggesting right atrial pressure of 15 mmHg.  6. Left-sided pleural effusion noted. Trivial pericardial effusion.   Micro Data:  Urine culture 4/22 > 4,000 positive gram negative rods Blood culture 4/22 > COVID 4/22 > negative  Antimicrobials:  Ceftriaxone 4/22    Interim history/subjective:  Atrial fibrillation No overnight events  Objective   Blood pressure 128/70, pulse 85, temperature 98.5 F (36.9 C), temperature source Axillary, resp. rate (!) 47, height _0  (1.676 m), weight 64.1 kg, SpO2 96 %.        Intake/Output Summary (Last 24 hours) at 02/12/2020 1516 Last data filed at 02/12/2020 1300 Gross per 24 hour  Intake 488.94 ml  Output 1185 ml  Net -696.06 ml   FAutoliv  02/09/20 1832 02/10/20 1529 02/12/20 0500  Weight: 58 kg 62.4 kg 64.1 kg    Examination: General: Chronically ill-appearing  HEENT: Moist oral mucosa Neuro: Alert and oriented x3, non-focal  CV: S1-S2 appreciated PULM:  Clear to ascultation, slightly diminished in left lower base, non  added breath sounds    Resolved Hospital Problem list     Assessment & Plan:  Acute right lower lobe PE  -Complicated by elevated troponin, elevated BNP, and RV dilation seen on CT  -PESI score V  Large loculated left pleural effusion -Post thoracentesis -Exudative fluid  SIRS secondary to UTI but need to rule out empyema as well  P: ECHO with EF of 50-55% with moderately reduced RV and enlargement  Continue systemic heparin  Goal INR 2 w/ 5 days minimal  Overlap. Follow pleural cytology and culture  Pleural LDH 317, serum LDH not obtained will collect today  Fluid likely exudative  Continue IV antibiotics  Follow cultures   Rest of chronic medical conditions managed by primary   PCCM will continue to follow   Best practice:  Diet: Heart healthy  Pain/Anxiety/Delirium protocol (if indicated): PRNs VAP protocol (if indicated): N/A DVT prophylaxis: Heparin drip GI prophylaxis: PPI Glucose control: Monitor  Mobility: Bedrest  Code Status: Full Family Communication: Will update Disposition: ICU   Labs   CBC: Recent Labs  Lab 02/07/20 1021 02/07/20 1021 02/09/20 1723 02/10/20 0332 02/11/20 0043 02/11/20 0511 02/12/20 0259  WBC 11.5*  --  12.1* 16.7*  --  12.1* 14.4*  NEUTROABS 9.2*  --  9.6*  --   --   --   --   HGB 11.7*  --  10.6* 8.5* 9.8* 8.3* 9.3*  HCT 36.3   < > 33.5* 27.2* 31.1* 26.0* 28.3*  MCV 90.5  --  91.0 91.9  --  91.5 87.9  PLT 231  --  165 123*  --  160 182   < > = values in this interval not displayed.    Basic Metabolic Panel: Recent Labs  Lab 02/09/20 1723 02/10/20 0332 02/11/20 0043 02/11/20 0511 02/12/20 0259  NA 135 139 135 135  136 134*  K 4.7 4.7 4.8 4.6  4.6 4.6  CL 102 109 105 104  106 103  CO2 22 21* 18* 20*  21* 21*  GLUCOSE 182* 169* 158* 143*  144* 122*  BUN 28* 25* 30* 33*  35* 36*  CREATININE 1.21* 0.97 1.03* 0.98  0.99 1.01*  CALCIUM 9.6 9.1 9.3 9.5  9.6 9.4  MG  --   --  2.1  --   --    GFR: Estimated  Creatinine Clearance: 41.6 mL/min (A) (by C-G formula based on SCr of 1.01 mg/dL (H)). Recent Labs  Lab 02/09/20 1723 02/09/20 1733 02/09/20 2010 02/10/20 0332 02/11/20 0511 02/12/20 0259  WBC 12.1*  --   --  16.7* 12.1* 14.4*  LATICACIDVEN  --  1.6 1.0  --   --   --     Liver Function Tests: Recent Labs  Lab 02/07/20 1021 02/09/20 1723 02/11/20 0511  AST 25 55* 69*  67*  ALT 27 58* 71*  69*  ALKPHOS 139* 186* 138*  133*  BILITOT 0.5 0.7 0.6  0.5  PROT 7.1 6.6 6.0*  5.9*  ALBUMIN 3.1* 2.9* 3.2*  3.1*   No results for input(s): LIPASE, AMYLASE in the last 168 hours. No results for input(s): AMMONIA in the last 168 hours.  ABG    Component Value  Date/Time   PHART 7.326 (L) 01/12/2011 0025   PCO2ART 43.6 01/12/2011 0025   PO2ART 86.0 01/12/2011 0025   HCO3 22.9 01/12/2011 0025   TCO2 26 04/29/2013 2136   ACIDBASEDEF 3.0 (H) 01/12/2011 0025   O2SAT 96.0 01/12/2011 0025     Coagulation Profile: Recent Labs  Lab 02/09/20 1723  INR 1.3*    Cardiac Enzymes: No results for input(s): CKTOTAL, CKMB, CKMBINDEX, TROPONINI in the last 168 hours.  HbA1C: Hgb A1c MFr Bld  Date/Time Value Ref Range Status  06/15/2018 03:25 AM 5.2 4.8 - 5.6 % Final    Comment:    (NOTE) Pre diabetes:          5.7%-6.4% Diabetes:              >6.4% Glycemic control for   <7.0% adults with diabetes   02/23/2016 02:30 PM 5.1 4.8 - 5.6 % Final    Comment:    (NOTE)         Pre-diabetes: 5.7 - 6.4         Diabetes: >6.4         Glycemic control for adults with diabetes: <7.0     CBG: No results for input(s): GLUCAP in the last 168 hours.  Review of Systems: Positive in bold   Gen: Denies fever, chills, weight change, fatigue, night sweats HEENT: Denies blurred vision, double vision, hearing loss, tinnitus, sinus congestion, rhinorrhea, sore throat, neck stiffness, dysphagia PULM: Denies shortness of breath, cough, sputum production, hemoptysis, wheezing CV: Denies chest  pain, edema, orthopnea, paroxysmal nocturnal dyspnea, palpitations GI: Denies abdominal pain, nausea, vomiting, diarrhea, hematochezia, melena, constipation, change in bowel habits GU: Denies dysuria, hematuria, polyuria, oliguria, urethral discharge Endocrine: Denies hot or cold intolerance, polyuria, polyphagia or appetite change Derm: Denies rash, dry skin, scaling or peeling skin change Heme: Denies easy bruising, bleeding, bleeding gums Neuro: Denies headache, numbness, weakness, slurred speech, loss of memory or consciousness  Past Medical History  She,  has a past medical history of Allergy, Anemia, Anemia of chronic disease (09/21/2016), Anxiety, Arthritis, Blood transfusion without reported diagnosis, Breast cancer ([place]) (2002), 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   Surgical History    Past Surgical History:  Procedure Laterality Date  . APPENDECTOMY    . BOWEL DECOMPRESSION N/A 06/12/2018   Procedure: BOWEL DECOMPRESSION;  Surgeon: Rush Landmark Telford Nab., MD;  Location: [facility];  Service: Gastroenterology;  Laterality: N/A;  . cataracts Bilateral   . COLECTOMY WITH COLOSTOMY CREATION/HARTMANN PROCEDURE  06/16/2018   Procedure: CREATION/HARTMANN COLOSTOMY;  Surgeon: Michael Boston, MD;  Location: [facility];  Service: General;;  . COLONOSCOPY    . COLOSTOMY N/A 06/16/2018   Procedure: POSSIBLE COLOSTOMY;  Surgeon: Michael Boston, MD;  Location: [facility];  Service: General;   Laterality: N/A;  . EYE SURGERY    . FLEXIBLE SIGMOIDOSCOPY N/A 06/12/2018   Procedure: FLEXIBLE SIGMOIDOSCOPY;  Surgeon: Rush Landmark Telford Nab., MD;  Location: [facility];  Service: Gastroenterology;  Laterality: N/A;  . LAPAROSCOPIC SIGMOID COLECTOMY N/A 06/16/2018   Procedure: LAPAROSCOPIC LOW ANTERIOR RESECTION;  Surgeon: Michael Boston, MD;  Location: [facility];  Service: General;  Laterality: N/A;  . LUNG SURGERY     Resection -" not done. Pt . tx with Tarceva  . MASTECTOMY Bilateral   . MEDIASTINOSCOPY N/A 10/02/2016   Procedure: MEDIASTINOSCOPY;  Surgeon: [name], MD;  Location: [facility] OR;  Service: Thoracic;  Laterality: N/A;  . RECONSTRUCTION BREAST W/ TRAM FLAP Bilateral    bilaterally  . robotic right hemicolectomy Right 03/06/2016   Dr. Johney Maine  . TEE WITHOUT CARDIOVERSION N/A 06/04/2013   Procedure: TRANSESOPHAGEAL ECHOCARDIOGRAM (TEE);  Surgeon: Jolaine Artist, MD;  Location: [facility] END[facility]COPY;  Service: Cardiovascular;  Laterality: N/A;  . TONSILLECTOMY    . TOTAL HIP ARTHROPLASTY Left 08/07/2016   Procedure: LEFT TOTAL HIP ARTHROPLASTY ANTERIOR APPROACH;  Surgeon: Gaynelle Arabian, MD;  Location: [facility];  Service: Orthopedics;  Laterality: Left;     Social History   reports that she has never smoked. She has never used smokeless tobacco. She reports current alcohol use. She reports that she does not use drugs.   Family History   Her family history includes Brain cancer in her cousin and paternal uncle; Breast cancer (age of onset: 40) in her sister; Cancer in her cousin, maternal uncle, paternal aunt, and paternal uncle; Clotting disorder (age of onset: 24) in her mother; Colon cancer in her cousin and sister; Colon cancer (age of onset: 70) in her maternal aunt; Heart disease in her mother; Lung cancer in her father and maternal uncle; Multiple myeloma in her paternal grandmother; Other in her maternal uncle; Prostate cancer in her maternal uncle; Stomach cancer in her  paternal aunt.   Allergies Allergies  Allergen Reactions  . Doxycycline Nausea Only    Weak, stomach  . Sulfa Antibiotics Rash     Sherrilyn Rist, MD [place] [facility] Pager: [phone]

## 2020-02-12 NOTE — Discharge Instructions (Addendum)
Atrial Fibrillation  Atrial fibrillation is a type of irregular or rapid heartbeat (arrhythmia). In atrial fibrillation, the top part of the heart (atria) beats in an irregular pattern. This makes the heart unable to pump blood normally and effectively. The goal of treatment is to prevent blood clots from forming, control your heart rate, or restore your heartbeat to a normal rhythm. If this condition is not treated, it can cause serious problems, such as a weakened heart muscle (cardiomyopathy) or a stroke. What are the causes? This condition is often caused by medical conditions that damage the heart's electrical system. These include:  High blood pressure (hypertension). This is the most common cause.  Certain heart problems or conditions, such as heart failure, coronary artery disease, heart valve problems, or heart surgery.  Diabetes.  Overactive thyroid (hyperthyroidism).  Obesity.  Chronic kidney disease. In some cases, the cause of this condition is not known. What increases the risk? This condition is more likely to develop in:  Older people.  People who smoke.  Athletes who do endurance exercise.  People who have a family history of atrial fibrillation.  Men.  People who use drugs.  People who drink a lot of alcohol.  People who have lung conditions, such as emphysema, pneumonia, or COPD.  People who have obstructive sleep apnea. What are the signs or symptoms? Symptoms of this condition include:  A feeling that your heart is racing or beating irregularly.  Discomfort or pain in your chest.  Shortness of breath.  Sudden light-headedness or weakness.  Tiring easily during exercise or activity.  Fatigue.  Syncope (fainting).  Sweating. In some cases, there are no symptoms. How is this diagnosed? Your health care provider may detect atrial fibrillation when taking your pulse. If detected, this condition may be diagnosed with:  An electrocardiogram  (ECG) to check electrical signals of the heart.  An ambulatory cardiac monitor to record your heart's activity for a few days.  A transthoracic echocardiogram (TTE) to create pictures of your heart.  A transesophageal echocardiogram (TEE) to create even closer pictures of your heart.  A stress test to check your blood supply while you exercise.  Imaging tests, such as a CT scan or chest X-ray.  Blood tests. How is this treated? Treatment depends on underlying conditions and how you feel when you experience atrial fibrillation. This condition may be treated with:  Medicines to prevent blood clots or to treat heart rate or heart rhythm problems.  Electrical cardioversion to reset the heart's rhythm.  A pacemaker to correct abnormal heart rhythm.  Ablation to remove the heart tissue that sends abnormal signals.  Left atrial appendage closure to seal the area where blood clots can form. In some cases, underlying conditions will be treated. Follow these instructions at home: Medicines  Take over-the counter and prescription medicines only as told by your health care provider.  Do not take any new medicines without talking to your health care provider.  If you are taking blood thinners: ? Talk with your health care provider before you take any medicines that contain aspirin or NSAIDs, such as ibuprofen. These medicines increase your risk for dangerous bleeding. ? Take your medicine exactly as told, at the same time every day. ? Avoid activities that could cause injury or bruising, and follow instructions about how to prevent falls. ? Wear a medical alert bracelet or carry a card that lists what medicines you take. Lifestyle      Do not use any products  that contain nicotine or tobacco, such as cigarettes, e-cigarettes, and chewing tobacco. If you need help quitting, ask your health care provider.  Eat heart-healthy foods. Talk with a dietitian to make an eating plan that is  right for you.  Exercise regularly as told by your health care provider.  Do not drink alcohol.  Lose weight if you are overweight.  Do not use drugs, including cannabis. General instructions  If you have obstructive sleep apnea, manage your condition as told by your health care provider.  Do not use diet pills unless your health care provider approves. Diet pills can make heart problems worse.  Keep all follow-up visits as told by your health care provider. This is important. Contact a health care provider if you:  Notice a change in the rate, rhythm, or strength of your heartbeat.  Are taking a blood thinner and you notice more bruising.  Tire more easily when you exercise or do heavy work.  Have a sudden change in weight. Get help right away if you have:   Chest pain, abdominal pain, sweating, or weakness.  Trouble breathing.  Side effects of blood thinners, such as blood in your vomit, stool, or urine, or bleeding that cannot stop.  Any symptoms of a stroke. "BE FAST" is an easy way to remember the main warning signs of a stroke: ? B - Balance. Signs are dizziness, sudden trouble walking, or loss of balance. ? E - Eyes. Signs are trouble seeing or a sudden change in vision. ? F - Face. Signs are sudden weakness or numbness of the face, or the face or eyelid drooping on one side. ? A - Arms. Signs are weakness or numbness in an arm. This happens suddenly and usually on one side of the body. ? S - Speech. Signs are sudden trouble speaking, slurred speech, or trouble understanding what people say. ? T - Time. Time to call emergency services. Write down what time symptoms started.  Other signs of a stroke, such as: ? A sudden, severe headache with no known cause. ? Nausea or vomiting. ? Seizure. These symptoms may represent a serious problem that is an emergency. Do not wait to see if the symptoms will go away. Get medical help right away. Call your local emergency  services (911 in the [place] Do not drive yourself to the hospital. Summary  Atrial fibrillation is a type of irregular or rapid heartbeat (arrhythmia).  Symptoms include a feeling that your heart is beating fast or irregularly.  You may be given medicines to prevent blood clots or to treat heart rate or heart rhythm problems.  Get help right away if you have signs or symptoms of a stroke.  Get help right away if you cannot catch your breath or have chest pain or pressure. This information is not intended to replace advice given to you by your health care provider. Make sure you discuss any questions you have with your health care provider. Document Revised: 03/31/2019 Document Reviewed: 03/31/2019 Elsevier Patient Education  [place]. Pleural Effusion Pleural effusion is an abnormal buildup of fluid in the layers of tissue between the lungs and the inside of the chest (pleural space) The two layers of tissue that line the lungs and the inside of the chest are called pleura. Usually, there is no air in the space between the pleura, only a thin layer of fluid. Some conditions can cause a large amount of fluid to build up, which can cause the lung  to collapse if untreated. A pleural effusion is usually caused by another disease that requires treatment. What are the causes? Pleural effusion can be caused by:  Heart failure.  Certain infections, such as pneumonia or tuberculosis.  Cancer.  A blood clot in the lung (pulmonary embolism).  Complications from surgery, such as from open heart surgery.  Liver disease (cirrhosis).  Kidney disease. What are the signs or symptoms? In some cases, pleural effusion may cause no symptoms. If symptoms are present, they may include:  Shortness of breath, especially when lying down.  Chest pain. This may get worse when taking a deep breath.  Fever.  Dry, long-lasting (chronic) cough.  Hiccups.  Rapid breathing. An underlying  condition that is causing the pleural effusion (such as heart failure, pneumonia, blood clots, tuberculosis, or cancer) may also cause other symptoms. How is this diagnosed? This condition may be diagnosed based on:  Your symptoms and medical history.  A physical exam.  A chest X-ray.  A procedure to use a needle to remove fluid from the pleural space (thoracentesis). This fluid is tested.  Other imaging studies of the chest, such as ultrasound or CT scan. How is this treated? Depending on the cause of your condition, treatment may include:  Treating the underlying condition that is causing the effusion. When that condition improves, the effusion will also improve. Examples of treatment for underlying conditions include: ? Antibiotic medicines to treat an infection. ? Diuretics or other heart medicines to treat heart failure.  Thoracentesis.  Placing a thin flexible tube under your skin and into your chest to continuously drain the effusion (indwelling pleural catheter).  Surgery to remove the outer layer of tissue from the pleural space (decortication).  A procedure to put medicine into the chest cavity to seal the pleural space and prevent fluid buildup (pleurodesis).  Chemotherapy and radiation therapy, if you have cancerous (malignant) pleural effusion. These treatments are typically used to treat cancer. They kill certain cells in the body. Follow these instructions at home:  Take over-the-counter and prescription medicines only as told by your health care provider.  Ask your health care provider what activities are safe for you.  Keep track of how long you are able to do mild exercise (such as walking) before you get short of breath. Write down this information to share with your health care provider. Your ability to exercise should improve over time.  Do not use any products that contain nicotine or tobacco, such as cigarettes and e-cigarettes. If you need help quitting,  ask your health care provider.  Keep all follow-up visits as told by your health care provider. This is important. Contact a health care provider if:  The amount of time that you are able to do mild exercise: ? Decreases. ? Does not improve with time.  You have a fever. Get help right away if:  You are short of breath.  You develop chest pain.  You develop a new cough. Summary  Pleural effusion is an abnormal buildup of fluid in the layers of tissue between the lungs and the inside of the chest.  Pleural effusion can have many causes, including heart failure, pulmonary embolism, infections, or cancer.  Symptoms of pleural effusion can include shortness of breath, chest pain, fever, long-lasting (chronic) cough, hiccups, or rapid breathing.  Diagnosis often involves making images of the chest (such as with ultrasound or X-ray) and removing fluid (thoracentesis) to send for testing.  Treatment for pleural effusion depends on what  underlying condition is causing it. This information is not intended to replace advice given to you by your health care provider. Make sure you discuss any questions you have with your health care provider. Document Revised: 09/19/2017 Document Reviewed: 06/12/2017 Elsevier Patient Education  [place]. Pulmonary Embolism  A pulmonary embolism (PE) is a sudden blockage or decrease of blood flow in one or both lungs. Most blockages come from a blood clot that forms in the vein of a lower leg, thigh, or arm (deep vein thrombosis, DVT) and travels to the lungs. A clot is blood that has thickened into a gel or solid. PE is a dangerous and life-threatening condition that needs to be treated right away. What are the causes? This condition is usually caused by a blood clot that forms in a vein and moves to the lungs. In rare cases, it may be caused by air, fat, part of a tumor, or other tissue that moves through the veins and into the lungs. What  increases the risk? The following factors may make you more likely to develop this condition:  Experiencing a traumatic injury, such as breaking a hip or leg.  Having: ? A spinal cord injury. ? Orthopedic surgery, especially hip or knee replacement. ? Any major surgery. ? A stroke. ? DVT. ? Blood clots or blood clotting disease. ? Long-term (chronic) lung or heart disease. ? Cancer treated with chemotherapy. ? A central venous catheter.  Taking medicines that contain estrogen. These include birth control pills and hormone replacement therapy.  Being: ? Pregnant. ? In the period of time after your baby is delivered (postpartum). ? Older than age 56. ? Overweight. ? A smoker, especially if you have other risks. What are the signs or symptoms? Symptoms of this condition usually start suddenly and include:  Shortness of breath during activity or at rest.  Coughing, coughing up blood, or coughing up blood-tinged mucus.  Chest pain that is often worse with deep breaths.  Rapid or irregular heartbeat.  Feeling light-headed or dizzy.  Fainting.  Feeling anxious.  Fever.  Sweating.  Pain and swelling in a leg. This is a symptom of DVT, which can lead to PE. How is this diagnosed? This condition may be diagnosed based on:  Your medical history.  A physical exam.  Blood tests.  CT pulmonary angiogram. This test checks blood flow in and around your lungs.  Ventilation-perfusion scan, also called a lung VQ scan. This test measures air flow and blood flow to the lungs.  An ultrasound of the legs. How is this treated? Treatment for this condition depends on many factors, such as the cause of your PE, your risk for bleeding or developing more clots, and other medical conditions you have. Treatment aims to remove, dissolve, or stop blood clots from forming or growing larger. Treatment may include:  Medicines, such as: ? Blood thinning medicines (anticoagulants) to stop  clots from forming. ? Medicines that dissolve clots (thrombolytics).  Procedures, such as: ? Using a flexible tube to remove a blood clot (embolectomy) or to deliver medicine to destroy it (catheter-directed thrombolysis). ? Inserting a filter into a large vein that carries blood to the heart (inferior vena cava). This filter (vena cava filter) catches blood clots before they reach the lungs. ? Surgery to remove the clot (surgical embolectomy). This is rare. You may need a combination of immediate, long-term (up to 3 months after diagnosis), and extended (more than 3 months after diagnosis) treatments. Your treatment may  continue for several months (maintenance therapy). You and your health care provider will work together to choose the treatment program that is best for you. Follow these instructions at home: Medicines  Take over-the-counter and prescription medicines only as told by your health care provider.  If you are taking an anticoagulant medicine: ? Take the medicine every day at the same time each day. ? Understand what foods and drugs interact with your medicine. ? Understand the side effects of this medicine, including excessive bruising or bleeding. Ask your health care provider or pharmacist about other side effects. General instructions  Wear a medical alert bracelet or carry a medical alert card that says you have had a PE and lists what medicines you take.  Ask your health care provider when you may return to your normal activities. Avoid sitting or lying for a long time without moving.  Maintain a healthy weight. Ask your health care provider what weight is healthy for you.  Do not use any products that contain nicotine or tobacco, such as cigarettes, e-cigarettes, and chewing tobacco. If you need help quitting, ask your health care provider.  Talk with your health care provider about any travel plans. It is important to make sure that you are still able to take your  medicine while on trips.  Keep all follow-up visits as told by your health care provider. This is important. Contact a health care provider if:  You missed a dose of your blood thinner medicine. Get help right away if:  You have: ? New or increased pain, swelling, warmth, or redness in an arm or leg. ? Numbness or tingling in an arm or leg. ? Shortness of breath during activity or at rest. ? A fever. ? Chest pain. ? A rapid or irregular heartbeat. ? A severe headache. ? Vision changes. ? A serious fall or accident, or you hit your head. ? Stomach (abdominal) pain. ? Blood in your vomit, stool, or urine. ? A cut that will not stop bleeding.  You cough up blood.  You feel light-headed or dizzy.  You cannot move your arms or legs.  You are confused or have memory loss. These symptoms may represent a serious problem that is an emergency. Do not wait to see if the symptoms will go away. Get medical help right away. Call your local emergency services (911 in the [place] Do not drive yourself to the hospital. Summary  A pulmonary embolism (PE) is a sudden blockage or decrease of blood flow in one or both lungs. PE is a dangerous and life-threatening condition that needs to be treated right away.  Treatments for this condition usually include medicines to thin your blood (anticoagulants) or medicines to break apart blood clots (thrombolytics).  If you are given blood thinners, it is important to take the medicine every day at the same time each day.  Understand what foods and drugs interact with any medicines that you are taking.  If you have signs of PE or DVT, call your local emergency services (911 in the [place] This information is not intended to replace advice given to you by your health care provider. Make sure you discuss any questions you have with your health care provider. Document Revised: 07/15/2018 Document Reviewed: 07/15/2018 Elsevier Patient Education  2020 [place] on my medicine - XARELTO (rivaroxaban)  This medication education was reviewed with me or my healthcare representative as part of my discharge preparation.  The pharmacist that spoke with me  during my hospital stay was:  Netta Cedars, [place]? 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  What do you need to know about Xarelto? The starting dose is one 15 mg tablet taken TWICE daily with food for the FIRST 21 DAYS then on May 15th the dose is changed to one 20 mg tablet taken ONCE A DAY with your evening meal.  DO NOT stop taking Xarelto without talking to the health care provider who prescribed the medication.  Refill your prescription for 20 mg tablets before you run out.  After discharge, you should have regular check-up appointments with your healthcare provider that is prescribing your Xarelto.  In the future your dose may need to be changed if your kidney function changes by a significant amount.  What do you do if you miss a dose? If you are taking Xarelto TWICE DAILY and you miss a dose, take it as soon as you remember. You may take two 15 mg tablets (total 30 mg) at the same time then resume your regularly scheduled 15 mg twice daily the next day.  If you are taking Xarelto ONCE DAILY and you miss a dose, take it as soon as you remember on the same day then continue your regularly scheduled once daily regimen the next day. Do not take two doses of Xarelto at the same time.   Important Safety Information Xarelto is a blood thinner medicine that can cause bleeding. You should call your healthcare provider right away if you experience any of the following: ? Bleeding from an injury or your nose that does not stop. ? Unusual colored urine (red or dark brown) or  unusual colored stools (red or black). ? Unusual bruising for unknown reasons. ? A serious fall or if you hit your head (even if there is no bleeding).  Some medicines may interact with Xarelto and might increase your risk of bleeding while on Xarelto. To help avoid this, consult your healthcare provider or pharmacist prior to using any new prescription or non-prescription medications, including herbals, vitamins, non-steroidal anti-inflammatory drugs (NSAIDs) and supplements.  This website has more information on Xarelto: [email or web address]

## 2020-02-12 NOTE — Progress Notes (Signed)
Progress Note  Patient Name: Cleotilde Spadaccini Date of Encounter: 02/12/2020  Primary Cardiologist: Kate Sable, MD   Subjective   Dyspnea improved  Inpatient Medications    Scheduled Meds:  amLODipine  5 mg Oral Daily   brimonidine  1 drop Right Eye BID   And   timolol  1 drop Right Eye BID   Chlorhexidine Gluconate Cloth  6 each Topical Daily   cholecalciferol  2,000 Units Oral Daily   clopidogrel  75 mg Oral Daily   cycloSPORINE  1 drop Both Eyes BID   escitalopram  10 mg Oral Daily   famotidine  20 mg Oral Daily   feeding supplement  1 Container Oral BID BM   feeding supplement (ENSURE ENLIVE)  237 mL Oral BID BM   letrozole  2.5 mg Oral Daily   lisinopril  20 mg Oral Daily   mouth rinse  15 mL Mouth Rinse BID   multivitamin with minerals  1 tablet Oral Daily   osimertinib mesylate  80 mg Oral Daily   pantoprazole  40 mg Oral QAC breakfast   psyllium  1 packet Oral Daily   Rivaroxaban  15 mg Oral BID WC   Followed by   Derrill Memo ON 03/03/2020] rivaroxaban  20 mg Oral Q supper   Continuous Infusions:  sodium chloride Stopped (02/11/20 0020)   amiodarone 30 mg/hr (02/12/20 0700)   cefTRIAXone (ROCEPHIN)  IV Stopped (02/11/20 2335)   PRN Meds: ALPRAZolam, ondansetron **OR** ondansetron (ZOFRAN) IV, oxyCODONE-acetaminophen   Vital Signs    Vitals:   02/12/20 0500 02/12/20 0600 02/12/20 0700 02/12/20 0840  BP: (!) 141/86 (!) 152/69 (!) 165/68   Pulse: 95 95 96   Resp: (!) 39 (!) 31 (!) 37   Temp:    98.3 F (36.8 C)  TempSrc:    Oral  SpO2: 95% 96% 96%   Weight: 64.1 kg     Height:        Intake/Output Summary (Last 24 hours) at 02/12/2020 0900 Last data filed at 02/12/2020 0700 Gross per 24 hour  Intake 688.1 ml  Output 1010 ml  Net -321.9 ml   Filed Weights   02/09/20 1832 02/10/20 1529 02/12/20 0500  Weight: 58 kg 62.4 kg 64.1 kg    Telemetry    NSR - Personally Reviewed  ECG    none - Personally  Reviewed  Physical Exam   GEN: frail but no acute distress.   Neck: 6 cm JVD Cardiac: RRR, no murmurs, rubs, or gallops.  Respiratory: scattered rales with decreased breath sounds on the left GI: Soft, nontender, non-distended  MS: No edema; No deformity. Neuro:  Nonfocal  Psych: Normal affect   Labs    Chemistry Recent Labs  Lab 02/07/20 1021 02/07/20 1021 02/09/20 1723 02/10/20 0332 02/11/20 0043 02/11/20 0511 02/12/20 0259  NA 137   < > 135   < > 135 135   136 134*  K 4.4   < > 4.7   < > 4.8 4.6   4.6 4.6  CL 102   < > 102   < > 105 104   106 103  CO2 23   < > 22   < > 18* 20*   21* 21*  GLUCOSE 126*   < > 182*   < > 158* 143*   144* 122*  BUN 24*   < > 28*   < > 30* 33*   35* 36*  CREATININE 1.13*  --  1.21*   < >  1.03* 0.98   0.99 1.01*  CALCIUM 9.9   < > 9.6   < > 9.3 9.5   9.6 9.4  PROT 7.1  --  6.6  --   --  6.0*   5.9*  --   ALBUMIN 3.1*  --  2.9*  --   --  3.2*   3.1*  --   AST 25  --  55*  --   --  69*   67*  --   ALT 27  --  58*  --   --  71*   69*  --   ALKPHOS 139*  --  186*  --   --  138*   133*  --   BILITOT 0.5  --  0.7  --   --  0.6   0.5  --   GFRNONAA 46*  --  42*   < > 51* 54*   54* 53*  GFRAA 53*  --  49*   < > 59* >60   >60 >60  ANIONGAP 12   < > 11   < > 12 11   9 10    < > = values in this interval not displayed.     Hematology Recent Labs  Lab 02/10/20 0332 02/10/20 0332 02/11/20 0043 02/11/20 0511 02/12/20 0259  WBC 16.7*  --   --  12.1* 14.4*  RBC 2.96*  --   --  2.84* 3.22*  HGB 8.5*   < > 9.8* 8.3* 9.3*  HCT 27.2*   < > 31.1* 26.0* 28.3*  MCV 91.9  --   --  91.5 87.9  MCH 28.7  --   --  29.2 28.9  MCHC 31.3  --   --  31.9 32.9  RDW 13.0  --   --  13.2 13.3  PLT 123*  --   --  160 182   < > = values in this interval not displayed.    Cardiac EnzymesNo results for input(s): TROPONINI in the last 168 hours. No results for input(s): TROPIPOC in the last 168 hours.   BNP Recent Labs  Lab 02/10/20 1339  BNP 717.3*      DDimer No results for input(s): DDIMER in the last 168 hours.   Radiology    DG Chest Port 1 View  Result Date: 02/11/2020 CLINICAL DATA:  Respiratory failure EXAM: PORTABLE CHEST 1 VIEW COMPARISON:  02/10/2020 FINDINGS: Cardiomegaly with vascular congestion. Small left pleural effusion. Left lower lobe atelectasis. Bilateral interstitial prominence may reflect mild interstitial edema. IMPRESSION: Cardiomegaly, suspect mild interstitial edema. Small left effusion with left base atelectasis. Electronically Signed   By: Rolm Baptise M.D.   On: 02/11/2020 02:22   DG CHEST PORT 1 VIEW  Result Date: 02/10/2020 CLINICAL DATA:  Post left thoracentesis EXAM: PORTABLE CHEST 1 VIEW COMPARISON:  02/09/2020 FINDINGS: Interval decrease in the left effusion following thoracentesis. Small left effusion remains. No pneumothorax. Left base atelectasis. Mild cardiomegaly. Right lung clear. IMPRESSION: Decreasing left effusion following thoracentesis.  No pneumothorax. Small left effusion and left base atelectasis. Electronically Signed   By: Rolm Baptise M.D.   On: 02/10/2020 19:13   ECHOCARDIOGRAM COMPLETE  Result Date: 02/10/2020    ECHOCARDIOGRAM REPORT   Patient Name:   GUELDA BATSON Date of Exam: 02/10/2020 Medical Rec #:  [id number]       Height:       66.0 in Accession #:    [id number]      Weight:  127.9 lb Date of Birth:  Mar 20, 1941       BSA:          1.654 m Patient Age:    79 years        BP:           111/58 mmHg Patient Gender: F               HR:           88 bpm. Exam Location:  Inpatient Procedure: 2D Echo and Intracardiac Opacification Agent Indications:    Pulmonary Embolus 415.19 / I26.99  History:        Patient has prior history of Echocardiogram examinations, most                 recent 02/01/2016. Risk Factors:Hypertension. CKD                 Pleural effusion.  Sonographer:    Vikki Ports Turrentine Referring Phys: 16 Rise Patience  Sonographer Comments: Technically difficult study  due to poor echo windows. Image acquisition challenging due to patient body habitus. IMPRESSIONS  1. Left ventricular ejection fraction, by estimation, is 50 to 55%. The left ventricle has low normal function. The left ventricle has no regional wall motion abnormalities. Left ventricular diastolic parameters are consistent with Grade I diastolic dysfunction (impaired relaxation).  2. Right ventricular systolic function is moderately reduced. The right ventricular size is moderately enlarged. D-shaped septum suggestive of RV pressure/volume overload. No complete TR doppler jet so unable to estimate PA systolic pressure.  3. The aortic valve is tricuspid. Aortic valve regurgitation is trivial. No aortic stenosis is present.  4. The mitral valve is normal in structure. No evidence of mitral valve regurgitation. No evidence of mitral stenosis.  5. The inferior vena cava is dilated in size with <50% respiratory variability, suggesting right atrial pressure of 15 mmHg.  6. Left-sided pleural effusion noted. Trivial pericardial effusion. FINDINGS  Left Ventricle: Left ventricular ejection fraction, by estimation, is 50 to 55%. The left ventricle has low normal function. The left ventricle has no regional wall motion abnormalities. Definity contrast agent was given IV to delineate the left ventricular endocardial borders. The left ventricular internal cavity size was normal in size. There is no left ventricular hypertrophy. Left ventricular diastolic parameters are consistent with Grade I diastolic dysfunction (impaired relaxation). Right Ventricle: The right ventricular size is moderately enlarged. No increase in right ventricular wall thickness. Right ventricular systolic function is moderately reduced. Tricuspid regurgitation signal is inadequate for assessing PA pressure. Left Atrium: Left atrial size was normal in size. Right Atrium: Right atrial size was normal in size. Pericardium: Trivial pericardial effusion is  present. Mitral Valve: The mitral valve is normal in structure. No evidence of mitral valve regurgitation. No evidence of mitral valve stenosis. Tricuspid Valve: The tricuspid valve is normal in structure. Tricuspid valve regurgitation is not demonstrated. Aortic Valve: The aortic valve is tricuspid. Aortic valve regurgitation is trivial. No aortic stenosis is present. Pulmonic Valve: The pulmonic valve was normal in structure. Pulmonic valve regurgitation is not visualized. Aorta: The aortic root is normal in size and structure. Venous: The inferior vena cava is dilated in size with less than 50% respiratory variability, suggesting right atrial pressure of 15 mmHg. IAS/Shunts: No atrial level shunt detected by color flow Doppler.  LEFT VENTRICLE PLAX 2D LVOT diam:     1.80 cm  Diastology LV SV:         36  LV e' lateral:   9.36 cm/s LV SV Index:   22       LV E/e' lateral: 3.6 LVOT Area:     2.54 cm LV e' medial:    7.83 cm/s                         LV E/e' medial:  4.3  LEFT ATRIUM           Index LA Vol (A4C): 36.2 ml 21.89 ml/m  AORTIC VALVE LVOT Vmax:   84.90 cm/s LVOT Vmean:  61.100 cm/s LVOT VTI:    0.141 m MITRAL VALVE MV Area (PHT): 3.37 cm    SHUNTS MV Decel Time: 225 msec    Systemic VTI:  0.14 m MV E velocity: 33.40 cm/s  Systemic Diam: 1.80 cm MV A velocity: 59.70 cm/s MV E/A ratio:  0.56 Loralie Champagne MD Electronically signed by Loralie Champagne MD Signature Date/Time: 02/10/2020/4:49:30 PM    Final    VAS [place] LOWER EXTREMITY VENOUS (DVT)  Result Date: 02/11/2020  Lower Venous DVTStudy Indications: Pulmonary embolism.  Performing Technologist: June Leap RDMS, RVT  Examination Guidelines: A complete evaluation includes B-mode imaging, spectral Doppler, color Doppler, and power Doppler as needed of all accessible portions of each vessel. Bilateral testing is considered an integral part of a complete examination. Limited examinations for reoccurring indications may be performed as noted. The  reflux portion of the exam is performed with the patient in reverse Trendelenburg.  +---------+---------------+---------+-----------+----------+--------------+  RIGHT     Compressibility Phasicity Spontaneity Properties Thrombus Aging  +---------+---------------+---------+-----------+----------+--------------+  CFV       Full            Yes       Yes                                    +---------+---------------+---------+-----------+----------+--------------+  SFJ       Full                                                             +---------+---------------+---------+-----------+----------+--------------+  FV Prox   Full                                                             +---------+---------------+---------+-----------+----------+--------------+  FV Mid    Full                                                             +---------+---------------+---------+-----------+----------+--------------+  FV Distal Full                                                             +---------+---------------+---------+-----------+----------+--------------+  PFV       Full                                                             +---------+---------------+---------+-----------+----------+--------------+  POP       Full            Yes       Yes                                    +---------+---------------+---------+-----------+----------+--------------+  PTV       Full                                                             +---------+---------------+---------+-----------+----------+--------------+  PERO      Full                                                             +---------+---------------+---------+-----------+----------+--------------+   +---------+---------------+---------+-----------+----------+--------------+  LEFT      Compressibility Phasicity Spontaneity Properties Thrombus Aging  +---------+---------------+---------+-----------+----------+--------------+  CFV       Full            Yes        Yes                                    +---------+---------------+---------+-----------+----------+--------------+  SFJ       Full                                                             +---------+---------------+---------+-----------+----------+--------------+  FV Prox   Full                                                             +---------+---------------+---------+-----------+----------+--------------+  FV Mid    Full                                                             +---------+---------------+---------+-----------+----------+--------------+  FV Distal Full                                                             +---------+---------------+---------+-----------+----------+--------------+  PFV       Full                                                             +---------+---------------+---------+-----------+----------+--------------+  POP       Full            Yes       Yes                                    +---------+---------------+---------+-----------+----------+--------------+  PTV       None                                                             +---------+---------------+---------+-----------+----------+--------------+  PERO      None                                                             +---------+---------------+---------+-----------+----------+--------------+     Summary: RIGHT: - There is no evidence of deep vein thrombosis in the lower extremity.  - No cystic structure found in the popliteal fossa.  LEFT: - Findings consistent with acute deep vein thrombosis involving the left posterior tibial veins, and left peroneal veins. - No cystic structure found in the popliteal fossa.  *See table(s) above for measurements and observations. Electronically signed by Harold Barban MD on 02/11/2020 at 5:06:34 PM.    Final     Cardiac Studies   2D echo reviewed ecg with NSR  Patient Profile     79 y.o. female admitted with atrial fib and sob, PE, now back to NSR on IV  amio.  Assessment & Plan    1. Pulmonary embolism - she is tolerating Xarelto. Continue 2. Atrial fib with a RVR - she has reverted to NSR on IV amio. Continue IV amio today and switch to po tomorrow if she is still in NSR 3. Acute on chronic diastolic heart failure - her weight is down 2 kg today. Continue medical therapy 4. Infection - she is tolerating IV anti-biotics.  Salome Spotted.      For questions or updates, please contact [facility] Please consult [email or web address] for contact info under Cardiology/STEMI.      Signed, Cristopher Peru, MD  02/12/2020, 9:00 AM  Patient ID: Tomasa Rand, female   DOB: [birth year], 79 y.o.   MRN: [id number]

## 2020-02-13 DIAGNOSIS — N390 Urinary tract infection, site not specified: Secondary | ICD-10-CM | POA: Diagnosis not present

## 2020-02-13 DIAGNOSIS — I2699 Other pulmonary embolism without acute cor pulmonale: Secondary | ICD-10-CM | POA: Diagnosis not present

## 2020-02-13 DIAGNOSIS — J9601 Acute respiratory failure with hypoxia: Secondary | ICD-10-CM | POA: Diagnosis not present

## 2020-02-13 DIAGNOSIS — C3490 Malignant neoplasm of unspecified part of unspecified bronchus or lung: Secondary | ICD-10-CM | POA: Diagnosis not present

## 2020-02-13 DIAGNOSIS — I480 Paroxysmal atrial fibrillation: Secondary | ICD-10-CM | POA: Diagnosis not present

## 2020-02-13 LAB — CBC
HCT: 30.1 % — ABNORMAL LOW (ref 36.0–46.0)
Hemoglobin: 9.8 g/dL — ABNORMAL LOW (ref 12.0–15.0)
MCH: 28.7 pg (ref 26.0–34.0)
MCHC: 32.6 g/dL (ref 30.0–36.0)
MCV: 88.3 fL (ref 80.0–100.0)
Platelets: 170 10*3/uL (ref 150–400)
RBC: 3.41 MIL/uL — ABNORMAL LOW (ref 3.87–5.11)
RDW: 13.6 % (ref 11.5–15.5)
WBC: 15.9 10*3/uL — ABNORMAL HIGH (ref 4.0–10.5)
nRBC: 0.1 % (ref 0.0–0.2)

## 2020-02-13 LAB — BASIC METABOLIC PANEL
Anion gap: 9 (ref 5–15)
BUN: 32 mg/dL — ABNORMAL HIGH (ref 8–23)
CO2: 22 mmol/L (ref 22–32)
Calcium: 8.9 mg/dL (ref 8.9–10.3)
Chloride: 105 mmol/L (ref 98–111)
Creatinine, Ser: 0.93 mg/dL (ref 0.44–1.00)
GFR calc Af Amer: >60 mL/min (ref >60)
GFR calc non Af Amer: 58 mL/min — ABNORMAL LOW (ref >60)
Glucose, Bld: 123 mg/dL — ABNORMAL HIGH (ref 70–99)
Potassium: 3.7 mmol/L (ref 3.5–5.1)
Sodium: 136 mmol/L (ref 135–145)

## 2020-02-13 LAB — PROCALCITONIN: Procalcitonin: 0.37 ng/mL

## 2020-02-13 MED ORDER — AMIODARONE HCL 200 MG PO TABS
200.0000 mg | ORAL_TABLET | Freq: Every day | ORAL | Status: DC
  Administered 2020-02-13 – 2020-02-19 (×7): 200 mg via ORAL
  Filled 2020-02-13 (×7): qty 1

## 2020-02-13 MED ORDER — VANCOMYCIN HCL 1250 MG/250ML IV SOLN
1250.0000 mg | Freq: Once | INTRAVENOUS | Status: AC
  Administered 2020-02-13: 1250 mg via INTRAVENOUS
  Filled 2020-02-13: qty 250

## 2020-02-13 MED ORDER — VANCOMYCIN HCL 750 MG/150ML IV SOLN
750.0000 mg | INTRAVENOUS | Status: DC
  Administered 2020-02-14: 750 mg via INTRAVENOUS
  Filled 2020-02-13 (×2): qty 150

## 2020-02-13 MED ORDER — FUROSEMIDE 10 MG/ML IJ SOLN
40.0000 mg | Freq: Every day | INTRAMUSCULAR | Status: AC
  Administered 2020-02-13 – 2020-02-14 (×2): 40 mg via INTRAVENOUS
  Filled 2020-02-13 (×2): qty 4

## 2020-02-13 MED ORDER — METRONIDAZOLE IN NACL 5-0.79 MG/ML-% IV SOLN
500.0000 mg | Freq: Three times a day (TID) | INTRAVENOUS | Status: DC
  Administered 2020-02-13 – 2020-02-15 (×6): 500 mg via INTRAVENOUS
  Filled 2020-02-13 (×6): qty 100

## 2020-02-13 MED ORDER — SODIUM CHLORIDE 0.9 % IV SOLN
2.0000 g | Freq: Every day | INTRAVENOUS | Status: DC
  Administered 2020-02-13 – 2020-02-14 (×2): 2 g via INTRAVENOUS
  Filled 2020-02-13: qty 20
  Filled 2020-02-13: qty 2

## 2020-02-13 NOTE — Progress Notes (Signed)
Progress Note  Patient Name: Kristin [name] Date of Encounter: 02/13/2020  Primary Cardiologist: Kate Sable, MD   Subjective   "I feel better this morning."  Inpatient Medications    Scheduled Meds: . amLODipine  5 mg Oral Daily  . brimonidine  1 drop Right Eye BID   And  . timolol  1 drop Right Eye BID  . Chlorhexidine Gluconate Cloth  6 each Topical Daily  . cholecalciferol  2,000 Units Oral Daily  . clopidogrel  75 mg Oral Daily  . cycloSPORINE  1 drop Both Eyes BID  . escitalopram  10 mg Oral Daily  . famotidine  20 mg Oral Daily  . feeding supplement  1 Container Oral BID BM  . feeding supplement (ENSURE ENLIVE)  237 mL Oral BID BM  . furosemide  40 mg Intravenous Daily  . letrozole  2.5 mg Oral Daily  . lisinopril  20 mg Oral Daily  . mouth rinse  15 mL Mouth Rinse BID  . multivitamin with minerals  1 tablet Oral Daily  . osimertinib mesylate  80 mg Oral Daily  . pantoprazole  40 mg Oral QAC breakfast  . psyllium  1 packet Oral Daily  . Rivaroxaban  15 mg Oral BID WC   Followed by  . [START ON 03/03/2020] rivaroxaban  20 mg Oral Q supper   Continuous Infusions: . sodium chloride Stopped (02/11/20 0020)  . amiodarone 30 mg/hr (02/13/20 0800)  . cefTRIAXone (ROCEPHIN)  IV Stopped (02/12/20 2229)   PRN Meds: ALPRAZolam, ondansetron **OR** ondansetron (ZOFRAN) IV, oxyCODONE-acetaminophen   Vital Signs    Vitals:   02/13/20 0600 02/13/20 0700 02/13/20 0800 02/13/20 0823  BP: (!) 163/66 (!) 133/97 (!) 149/69   Pulse: 88 84 86   Resp: (!) 31 (!) 32 (!) 34   Temp:    98.3 F (36.8 C)  TempSrc:    Oral  SpO2: 95% 96% 95%   Weight:      Height:        Intake/Output Summary (Last 24 hours) at 02/13/2020 0840 Last data filed at 02/13/2020 0800 Gross per 24 hour  Intake 516.53 ml  Output 1175 ml  Net -658.47 ml   Filed Weights   02/10/20 1529 02/12/20 0500 02/13/20 0500  Weight: 62.4 kg 64.1 kg 61.9 kg    Telemetry    nsr - Personally  Reviewed  ECG    none - Personally Reviewed  Physical Exam   GEN: No acute distress.   Neck: No JVD Cardiac: RRR, no murmurs, rubs, or gallops.  Respiratory: decreased breath sounds left base, scattered rales GI: Soft, nontender, non-distended  MS: No edema; No deformity. Neuro:  Nonfocal  Psych: Normal affect   Labs    Chemistry Recent Labs  Lab 02/07/20 1021 02/07/20 1021 02/09/20 1723 02/10/20 0332 02/11/20 0511 02/12/20 0259 02/13/20 0251  NA 137   < > 135   < > 135  136 134* 136  K 4.4   < > 4.7   < > 4.6  4.6 4.6 3.7  CL 102   < > 102   < > 104  106 103 105  CO2 23   < > 22   < > 20*  21* 21* 22  GLUCOSE 126*   < > 182*   < > 143*  144* 122* 123*  BUN 24*   < > 28*   < > 33*  35* 36* 32*  CREATININE 1.13*  --  1.21*   < >  0.98  0.99 1.01* 0.93  CALCIUM 9.9   < > 9.6   < > 9.5  9.6 9.4 8.9  PROT 7.1  --  6.6  --  6.0*  5.9*  --   --   ALBUMIN 3.1*  --  2.9*  --  3.2*  3.1*  --   --   AST 25  --  55*  --  69*  67*  --   --   ALT 27  --  58*  --  71*  69*  --   --   ALKPHOS 139*  --  186*  --  138*  133*  --   --   BILITOT 0.5  --  0.7  --  0.6  0.5  --   --   GFRNONAA 46*  --  42*   < > 54*  54* 53* 58*  GFRAA 53*  --  49*   < > >60  >60 >60 >60  ANIONGAP 12   < > 11   < > 11  9 10 9    < > = values in this interval not displayed.     Hematology Recent Labs  Lab 02/11/20 0511 02/12/20 0259 02/13/20 0251  WBC 12.1* 14.4* 15.9*  RBC 2.84* 3.22* 3.41*  HGB 8.3* 9.3* 9.8*  HCT 26.0* 28.3* 30.1*  MCV 91.5 87.9 88.3  MCH 29.2 28.9 28.7  MCHC 31.9 32.9 32.6  RDW 13.2 13.3 13.6  PLT 160 182 170    Cardiac EnzymesNo results for input(s): TROPONINI in the last 168 hours. No results for input(s): TROPIPOC in the last 168 hours.   BNP Recent Labs  Lab 02/10/20 1339  BNP 717.3*     DDimer No results for input(s): DDIMER in the last 168 hours.   Radiology    DG CHEST PORT 1 VIEW  Result Date: 02/12/2020 CLINICAL DATA:  Shortness  of breath EXAM: PORTABLE CHEST 1 VIEW COMPARISON:  02/11/2020 and 02/10/2020 FINDINGS: Cardiomediastinal contours remain enlarged. Bilateral effusions similar to prior study with persistent LEFT lower lobe airspace disease and obscured LEFT hemidiaphragm. Increased interstitial markings persist. No acute bone process. IMPRESSION: No interval change in appearance of the chest since prior study. Bilateral effusions with LEFT lower lobe airspace disease. Persistent increased interstitial markings cardiomegaly. Electronically Signed   By: Zetta Bills M.D.   On: 02/12/2020 18:18   VAS [place] LOWER EXTREMITY VENOUS (DVT)  Result Date: 02/11/2020  Lower Venous DVTStudy Indications: Pulmonary embolism.  Performing Technologist: June Leap RDMS, RVT  Examination Guidelines: A complete evaluation includes B-mode imaging, spectral Doppler, color Doppler, and power Doppler as needed of all accessible portions of each vessel. Bilateral testing is considered an integral part of a complete examination. Limited examinations for reoccurring indications may be performed as noted. The reflux portion of the exam is performed with the patient in reverse Trendelenburg.  +---------+---------------+---------+-----------+----------+--------------+ RIGHT    CompressibilityPhasicitySpontaneityPropertiesThrombus Aging +---------+---------------+---------+-----------+----------+--------------+ CFV      Full           Yes      Yes                                 +---------+---------------+---------+-----------+----------+--------------+ SFJ      Full                                                        +---------+---------------+---------+-----------+----------+--------------+  FV Prox  Full                                                        +---------+---------------+---------+-----------+----------+--------------+ FV Mid   Full                                                         +---------+---------------+---------+-----------+----------+--------------+ FV DistalFull                                                        +---------+---------------+---------+-----------+----------+--------------+ PFV      Full                                                        +---------+---------------+---------+-----------+----------+--------------+ POP      Full           Yes      Yes                                 +---------+---------------+---------+-----------+----------+--------------+ PTV      Full                                                        +---------+---------------+---------+-----------+----------+--------------+ PERO     Full                                                        +---------+---------------+---------+-----------+----------+--------------+   +---------+---------------+---------+-----------+----------+--------------+ LEFT     CompressibilityPhasicitySpontaneityPropertiesThrombus Aging +---------+---------------+---------+-----------+----------+--------------+ CFV      Full           Yes      Yes                                 +---------+---------------+---------+-----------+----------+--------------+ SFJ      Full                                                        +---------+---------------+---------+-----------+----------+--------------+ FV Prox  Full                                                        +---------+---------------+---------+-----------+----------+--------------+  FV Mid   Full                                                        +---------+---------------+---------+-----------+----------+--------------+ FV DistalFull                                                        +---------+---------------+---------+-----------+----------+--------------+ PFV      Full                                                         +---------+---------------+---------+-----------+----------+--------------+ POP      Full           Yes      Yes                                 +---------+---------------+---------+-----------+----------+--------------+ PTV      None                                                        +---------+---------------+---------+-----------+----------+--------------+ PERO     None                                                        +---------+---------------+---------+-----------+----------+--------------+     Summary: RIGHT: - There is no evidence of deep vein thrombosis in the lower extremity.  - No cystic structure found in the popliteal fossa.  LEFT: - Findings consistent with acute deep vein thrombosis involving the left posterior tibial veins, and left peroneal veins. - No cystic structure found in the popliteal fossa.  *See table(s) above for measurements and observations. Electronically signed by Harold Barban MD on 02/11/2020 at 5:06:34 PM.    Final     Cardiac Studies   none  Patient Profile     79 y.o. female admitted with fever/sob/pulmonary embolism/atrial fib now back in NSR. Pleural effusion noted  Assessment & Plan    1. PAF - she is maintaining NSR. Her IV amio will be stopped and she will be switched to po. 2. Pulmonary embolism - continue xarelto 3. Pleural effusion - as per pulm/ccm     For questions or updates, please contact [place] Please consult [email or web address] for contact info under Cardiology/STEMI.      Signed, Cristopher Peru, MD  02/13/2020, 8:40 AM  Patient ID: Kristin [name], female   DOB: 1941-06-17, 79 y.o.   MRN: [id number]

## 2020-02-13 NOTE — Progress Notes (Signed)
NAME:  Kristin [name], MRN:  [id number], DOB:  08-Dec-1940, LOS: 3 ADMISSION DATE:  02/09/2020, CONSULTATION DATE:  02/10/2020 REFERRING MD:  Dr. Reesa Chew, CHIEF COMPLAINT:  Fever and shortness of breath   Brief History   79yo female with extensive PMH who presented with complaint of shortness of breath, weakness, weight loss, and fever. Found to have large pleural effusion and PE.   History of present illness   Kristin [name] is a 78 yo female with PMH significant for metastatic non-small cell lung cancer, colon cancer s/p colostomy, breast cancer, hypertension, prior CVA, and anxiety who presented to the ED due to complaints of shortness of breath, weakness, fever, and weight loss. Per patient she was seen by oncologist 3 days prior to admission for follow up where she received IV fluids and empirically started on Macrobid for presumed UTI. She additionally reports left sided back/falnk pain. Patient continued to feel poor with eventually development of fever of 103 which prompted presentation to the ER.   On admission lab works significant for Creatinine 1.21 (baseline 0.9-1.0), elevated alkaline phos of 182, elevated WBC of 12.1, hgb 10.6 elevated BNP of 717.3. Vitals on admission significant for tachypnea and mild tachycardia meeting criteria for SIRS. Workup thus far reveals acute PE with CT evidence of RV strain as well as significant enlargement large left pleural effusion.   PCCM was consulted for assistance in management of pleural effusion and PE Given SIRS criteria with presence of left pleural effusion patient needs diagnostic thoracentesis to rule out empyema.   Past Medical History  Anemia of chronic disease Breast cancer Colon cancer NSCL cancer  CVA Hypertension  Significant Hospital Events   Admitted 4/22  Consults:    Procedures:  Left thora 4/22 > 956m blood pleural fluid removed   Significant Diagnostic Tests:  CTA chest 4/22 >  evidence of RV strain as well as  significant enlargement large left pleural effusion.   ECHO 4/22 > 1. Left ventricular ejection fraction, by estimation, is 50 to 55%. The  left ventricle has low normal function. The left ventricle has no regional  wall motion abnormalities. Left ventricular diastolic parameters are  consistent with Grade I diastolic  dysfunction (impaired relaxation).  2. Right ventricular systolic function is moderately reduced. The right  ventricular size is moderately enlarged. D-shaped septum suggestive of RV  pressure/volume overload. No complete TR doppler jet so unable to estimate  PA systolic pressure.  3. The aortic valve is tricuspid. Aortic valve regurgitation is trivial.  No aortic stenosis is present.  4. The mitral valve is normal in structure. No evidence of mitral valve  regurgitation. No evidence of mitral stenosis.  5. The inferior vena cava is dilated in size with <50% respiratory  variability, suggesting right atrial pressure of 15 mmHg.  6. Left-sided pleural effusion noted. Trivial pericardial effusion.   Micro Data:  Urine culture 4/22 > 4,000 positive gram negative rods Blood culture 4/22 > COVID 4/22 > negative  Antimicrobials:  Ceftriaxone 4/22    Interim history/subjective:  Atrial fibrillation No overnight events Feels a little bit better today  Objective   Blood pressure (!) 149/69, pulse 86, temperature 98.3 F (36.8 C), temperature source Oral, resp. rate (!) 34, height 5' 6"  (1.676 m), weight 61.9 kg, SpO2 95 %.        Intake/Output Summary (Last 24 hours) at 02/13/2020 1205 Last data filed at 02/13/2020 0800 Gross per 24 hour  Intake 449.85 ml  Output 1050 ml  Net -  600.15 ml   Filed Weights   02/10/20 1529 02/12/20 0500 02/13/20 0500  Weight: 62.4 kg 64.1 kg 61.9 kg    Examination: General: Chronically ill-appearing  HEENT: Moist oral mucosa Neuro: Alert and oriented x3, non-focal  CV: S1-S2 appreciated PULM: Decreased breath sounds at the  bases   Resolved Hospital Problem list     Assessment & Plan:  Acute right lower lobe PE  -Complicated by elevated troponin, elevated BNP, and RV dilation seen on CT  -PESI score V  Large loculated left pleural effusion -Post thoracentesis -Exudative fluid -Chest x-ray reviewed, does not show significant reaccumulation  SIRS secondary to UTI but need to rule out empyema as well  P: ECHO with EF of 50-55% with moderately reduced RV and enlargement  Continue systemic heparin  Goal INR 2 w/ 5 days minimal  Overlap. Follow pleural cytology and culture  Pleural LDH 317, serum LDH not obtained will collect today  Fluid likely exudative  Continue IV antibiotics  Follow cultures   Rest of chronic medical conditions managed by primary   PCCM will continue to follow No changes to treatment at present  Best practice:  Diet: Heart healthy  Pain/Anxiety/Delirium protocol (if indicated): PRNs VAP protocol (if indicated): N/A DVT prophylaxis: Heparin drip GI prophylaxis: PPI Glucose control: Monitor  Mobility: Bedrest  Code Status: Full Family Communication: Will update Disposition: ICU   Labs   CBC: Recent Labs  Lab 02/07/20 1021 02/07/20 1021 02/09/20 1723 02/09/20 1723 02/10/20 0332 02/11/20 0043 02/11/20 0511 02/12/20 0259 02/13/20 0251  WBC 11.5*  --  12.1*  --  16.7*  --  12.1* 14.4* 15.9*  NEUTROABS 9.2*  --  9.6*  --   --   --   --   --   --   HGB 11.7*  --  10.6*   < > 8.5* 9.8* 8.3* 9.3* 9.8*  HCT 36.3   < > 33.5*   < > 27.2* 31.1* 26.0* 28.3* 30.1*  MCV 90.5   < > 91.0  --  91.9  --  91.5 87.9 88.3  PLT 231  --  165  --  123*  --  160 182 170   < > = values in this interval not displayed.    Basic Metabolic Panel: Recent Labs  Lab 02/10/20 0332 02/11/20 0043 02/11/20 0511 02/12/20 0259 02/13/20 0251  NA 139 135 135  136 134* 136  K 4.7 4.8 4.6  4.6 4.6 3.7  CL 109 105 104  106 103 105  CO2 21* 18* 20*  21* 21* 22  GLUCOSE 169* 158* 143*   144* 122* 123*  BUN 25* 30* 33*  35* 36* 32*  CREATININE 0.97 1.03* 0.98  0.99 1.01* 0.93  CALCIUM 9.1 9.3 9.5  9.6 9.4 8.9  MG  --  2.1  --   --   --    GFR: Estimated Creatinine Clearance: 45.2 mL/min (by C-G formula based on SCr of 0.93 mg/dL). Recent Labs  Lab 02/09/20 1723 02/09/20 1733 02/09/20 2010 02/10/20 0332 02/11/20 0511 02/12/20 0259 02/13/20 0251  WBC   < >  --   --  16.7* 12.1* 14.4* 15.9*  LATICACIDVEN  --  1.6 1.0  --   --   --   --    < > = values in this interval not displayed.    Liver Function Tests: Recent Labs  Lab 02/07/20 1021 02/09/20 1723 02/11/20 0511  AST 25 55* 69*  67*  ALT  27 58* 71*  69*  ALKPHOS 139* 186* 138*  133*  BILITOT 0.5 0.7 0.6  0.5  PROT 7.1 6.6 6.0*  5.9*  ALBUMIN 3.1* 2.9* 3.2*  3.1*   No results for input(s): LIPASE, AMYLASE in the last 168 hours. No results for input(s): AMMONIA in the last 168 hours.  ABG    Component Value Date/Time   PHART 7.326 (L) 01/12/2011 0025   PCO2ART 43.6 01/12/2011 0025   PO2ART 86.0 01/12/2011 0025   HCO3 22.9 01/12/2011 0025   TCO2 26 04/29/2013 2136   ACIDBASEDEF 3.0 (H) 01/12/2011 0025   O2SAT 96.0 01/12/2011 0025     Coagulation Profile: Recent Labs  Lab 02/09/20 1723  INR 1.3*    Cardiac Enzymes: No results for input(s): CKTOTAL, CKMB, CKMBINDEX, TROPONINI in the last 168 hours.  HbA1C: Hgb A1c MFr Bld  Date/Time Value Ref Range Status  06/15/2018 03:25 AM 5.2 4.8 - 5.6 % Final    Comment:    (NOTE) Pre diabetes:          5.7%-6.4% Diabetes:              >6.4% Glycemic control for   <7.0% adults with diabetes   02/23/2016 02:30 PM 5.1 4.8 - 5.6 % Final    Comment:    (NOTE)         Pre-diabetes: 5.7 - 6.4         Diabetes: >6.4         Glycemic control for adults with diabetes: <7.0     CBG: No results for input(s): GLUCAP in the last 168 hours.  Review of Systems: Positive in bold   Gen: Denies fever, chills, weight change, fatigue, night  sweats HEENT: Denies blurred vision, double vision, hearing loss, tinnitus, sinus congestion, rhinorrhea, sore throat, neck stiffness, dysphagia PULM: Denies shortness of breath, cough, sputum production, hemoptysis, wheezing CV: Denies chest pain, edema, orthopnea, paroxysmal nocturnal dyspnea, palpitations GI: Denies abdominal pain, nausea, vomiting, diarrhea, hematochezia, melena, constipation, change in bowel habits GU: Denies dysuria, hematuria, polyuria, oliguria, urethral discharge Endocrine: Denies hot or cold intolerance, polyuria, polyphagia or appetite change Derm: Denies rash, dry skin, scaling or peeling skin change Heme: Denies easy bruising, bleeding, bleeding gums Neuro: Denies headache, numbness, weakness, slurred speech, loss of memory or consciousness  Past Medical History  She,  has a past medical history of Allergy, Anemia, Anemia of chronic disease (09/21/2016), Anxiety, Arthritis, Blood transfusion without reported diagnosis, Breast cancer ([place]) (2002), 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   Surgical History    Past Surgical History:  Procedure Laterality Date  . APPENDECTOMY    . BOWEL DECOMPRESSION N/A 06/12/2018   Procedure: BOWEL DECOMPRESSION;  Surgeon: Rush Landmark Telford Nab., MD;  Location: [facility];  Service: Gastroenterology;  Laterality: N/A;  . cataracts Bilateral   . COLECTOMY WITH COLOSTOMY CREATION/HARTMANN PROCEDURE  06/16/2018   Procedure:  CREATION/HARTMANN COLOSTOMY;  Surgeon: Michael Boston, MD;  Location: [facility];  Service: General;;  . COLONOSCOPY    . COLOSTOMY N/A 06/16/2018   Procedure: POSSIBLE COLOSTOMY;  Surgeon: Michael Boston, MD;  Location: [facility];  Service:  General;  Laterality: N/A;  . EYE SURGERY    . FLEXIBLE SIGMOIDOSCOPY N/A 06/12/2018   Procedure: FLEXIBLE SIGMOIDOSCOPY;  Surgeon: Rush Landmark Telford Nab., MD;  Location: [facility];  Service: Gastroenterology;  Laterality: N/A;  . LAPAROSCOPIC SIGMOID COLECTOMY N/A 06/16/2018   Procedure: LAPAROSCOPIC LOW ANTERIOR RESECTION;  Surgeon: Michael Boston, MD;  Location: [facility];  Service: General;  Laterality: N/A;  . LUNG SURGERY     Resection -" not done. Pt . tx with Tarceva  . MASTECTOMY Bilateral   . MEDIASTINOSCOPY N/A 10/02/2016   Procedure: MEDIASTINOSCOPY;  Surgeon: [name], MD;  Location[facility];  Service: Thoracic;  Laterality: N/A;  . RECONSTRUCTION BREAST W/ TRAM FLAP Bilateral    bilaterally  . robotic right hemicolectomy Right 03/06/2016   Dr. Johney Maine  . TEE WITHOUT CARDIOVERSION N/A 06/04/2013   Procedure: TRANSESOPHAGEAL ECHOCARDIOGRAM (TEE);  Surgeon: Jolaine Artist, MD;  Location: [facility] ENDOSCOPY;  Service: Cardiovascular;  Laterality: N/A;  . TONSILLECTOMY    . TOTAL HIP ARTHROPLASTY Left 08/07/2016   Procedure: LEFT TOTAL HIP ARTHROPLASTY ANTERIOR APPROACH;  Surgeon: Gaynelle Arabian, MD;  Location: [facility];  Service: Orthopedics;  Laterality: Left;     Social History   reports that she has never smoked. She has never used smokeless tobacco. She reports current alcohol use. She reports that she does not use drugs.   Family History   Her family history includes Brain cancer in her cousin and paternal uncle; Breast cancer (age of onset: 33) in her sister; Cancer in her cousin, maternal uncle, paternal aunt, and paternal uncle; Clotting disorder (age of onset: 74) in her mother; Colon cancer in her cousin and sister; Colon cancer (age of  onset: 52) in her maternal aunt; Heart disease in her mother; Lung cancer in her father and maternal uncle; Multiple myeloma in her paternal grandmother; Other in her maternal uncle; Prostate cancer in her maternal uncle; Stomach cancer in her paternal aunt.   Allergies Allergies  Allergen Reactions  . Doxycycline Nausea Only    Weak, stomach  . Sulfa Antibiotics Rash     Sherrilyn Rist, MD [place] [facility] Pager: [phone]

## 2020-02-13 NOTE — Progress Notes (Signed)
Pharmacy Antibiotic Note  Kristin [name] is a 79 y.o. female admitted on 02/09/2020 with large pleural effusion & +PE.  She is currently on Rocephin for Ecoli UTI & possible empyema.  Pharmacy has been consulted for Vancomycin dosing which is being adding for fever.  02/13/2020: -Day#4 Rocephin - Tm 100.5 - WBC increasing - Scr at baseline - Pleural fluid cx are negative to date & MRSA PCR negative 4/22.  repeat BCx ordered by   MD  Plan: -Vancomycin 1250mg  IV bolus x1 then 750mg  IV q24h to target AUC 400-550 -Continue Rocephin & Flagyl per MD -Monitor renal function and cx data   Height: 5\' 6"  (167.6 cm) Weight: 61.9 kg (136 lb 7.4 oz) IBW/kg (Calculated) : 59.3  Temp (24hrs), Avg:98.6 F (37 C), Min:97.7 F (36.5 C), Max:100.5 F (38.1 C)  Recent Labs  Lab 02/09/20 1723 02/09/20 1723 02/09/20 1733 02/09/20 2010 02/10/20 0332 02/11/20 0043 02/11/20 0511 02/12/20 0259 02/13/20 0251  WBC 12.1*  --   --   --  16.7*  --  12.1* 14.4* 15.9*  CREATININE 1.21*   < >  --   --  0.97 1.03* 0.98  0.99 1.01* 0.93  LATICACIDVEN  --   --  1.6 1.0  --   --   --   --   --    < > = values in this interval not displayed.    Estimated Creatinine Clearance: 45.2 mL/min (by C-G formula based on SCr of 0.93 mg/dL).    Allergies  Allergen Reactions  . Doxycycline Nausea Only    Weak, stomach  . Sulfa Antibiotics Rash    Antimicrobials this admission:  4/21 zosyn x1 4/21 vanc x1, 4/25>> 4/22 CTX>> 4/25 Flagyl>>  Dose adjustments this admission:  4/25 Inc CTX to 2g  Microbiology results:  4/21 BCx x2: NGTD 4/21 UCx: 5K Ecoli (R Cipro, otherwise pan-sens) 4/22 Pleural fluid: NGTD 4/22 MRSA PCR: negative 4/25 BCx x2:  Thank you for allowing pharmacy to be a part of this patient's care.  Netta Cedars PharmD, BCPS 02/13/2020 1:06 PM

## 2020-02-13 NOTE — Progress Notes (Addendum)
[facility]                                                                              Patient Demographics  Kristin [name], is a 79 y.o. female, DOB - 09-28-1941, QQI:[id number]  Admit date - 02/09/2020   Admitting Physician Rise Patience, MD  Outpatient Primary MD for the patient is Eber Hong, MD  Outpatient specialists:   LOS - 3  days   Medical records reviewed and are as summarized below:    Chief Complaint  Patient presents with  . Shortness of Breath  . Weakness       Brief summary   Kristin [name] a 79 y.o.femalewithhistory of metastatic non-small cell lung cancer with history of colon cancer status post colostomy and history of breast cancer with history of hypertension CVA has been experiencing increasing shortness of breath and weakness over the last few days with increasing weight loss had followed up with her oncologist 3 days ago at that time patient was placed on fluids and empirically started on Macrobid. Despite which patient is still having increasing weakness and the last 2 days had fevers noted at home with temperature 101 F. Which prompted patient to come to the ER.  On admission, creatinine 1.2, elevated alk phos, leukocytosis, hemoglobin 10.6, BNP elevated 717.3.  Patient met criteria for SIRS, work-up revealed acute PE, large left pleural effusion. Underwent thoracentesis on 4/22, 950 cc of bloody pleural fluid removed PCCM following.  Developed persistent A. fib with RVR on 4/23, was placed on IV amiodarone and Cardizem, cardiology was consulted   Assessment & Plan    Principal Problem:   Acute right lower lobe pulmonary embolism ([place]), acute LLE DVT, acute hypoxic respiratory failure -In the setting of known malignancies. -CTA chest showed right lower lobe segmental pulmonary emboli, minimal clot burden, partially loculated left pleural effusion, measuring> 1 L, continued areas of pleural thickening suspicious  for recurrent disease -Lower extremity venous Doppler showed acute DVT involving the left posterior tibial vein, left peroneal vein -2D echo showed EF of 50 to 94%, grade 1 diastolic dysfunction, RV EF moderately reduced with RV volume overload -Initially placed on IV heparin drip, now transitioned to oral Xarelto Currently O2 sats 95% on 5 L  Active Problems: Loculated left pleural effusion -Status post thoracentesis by pulmonology on 4/22, 950 cc of bloody pleural fluid removed -Fluid cultures negative so far, cytology pending.  Continue IV ceftriaxone -Pulmonology following -Pleural fluid studies exudative, possible empyema, now spiking fevers, obtain blood cultures, procalcitonin -Continue IV Rocephin, added vancomycin and Flagyl    Atrial fibrillation with RVR, paroxysmal -Patient was placed on amiodarone drip, diltiazem drip, heparin drip -Converted to normal sinus rhythm, cardiology following, recommended continue IV amiodarone drip today and switch to oral tomorrow if still in NSR -Continue Xarelto  Acute on chronic diastolic CHF -Likely precipitated due to acute PE and A. fib with RVR -Chest x-ray 4/24 showed bilateral pleural effusions, patient had respiratory distress, needed Lasix 20 mg IV x1 -Placed on Lasix 40 mg IV daily for 2 doses, then will reassess strict I's and O's and daily weights. -I's and  O's with 2.5 L positive currently  E. coli UTI -Urine culture showed E. coli, on Rocephin -Met SIRS criteria at the time of admission  Essential hypertension Continue lisinopril, amlodipine  CKD stage IIIa -Creatinine currently at baseline Monitor closely with diuresis  Normocytic anemia -Likely related to malignancy H&H currently stable  NSCLC of the lung, history of adenocarcinoma, status post colostomy History of breast CA -Followed by oncology, Dr. Julien Nordmann  ?Hyperthyroidism -TSH low 0.01, free T4 elevated 1.49, T3 WNL, also on amiodarone currently -Will  need thyroid studies in 4 weeks, if TSH continues to be low with elevated free T4, may need to start methimazole, will defer to PCP  Elevated LFTs -AST/ALT/alk phos slightly up.  CT renal showed GB with sludge Hepatitis panel negative  Urinary retention -Foley  Generalized debility -PT OT evaluation  Moderate to severe protein calorie malnutrition Estimated body mass index is 22.03 kg/m as calculated from the following:   Height as of this encounter: _0  (1.676 m).   Weight as of this encounter: 61.9 kg.  Code Status: Full CODE STATUS DVT Prophylaxis: Xarelto Family Communication: Discussed all imaging results, lab results, explained to the patient and patient's daughter at the bedside   Disposition Plan:     Status is: Inpatient  Remains inpatient appropriate because:IV treatments appropriate due to intensity of illness or inability to take PO   Dispo: The patient is from: Home              Anticipated d/c is to: Home              Anticipated d/c date is: 2 days              Patient currently is not medically stable to d/c.   Time Spent in minutes   35 minutes  Procedures:  Thoracentesis  Consultants:   Cardiology Pulmonology  Antimicrobials:   Anti-infectives (From admission, onward)   Start     Dose/Rate Route Frequency Ordered Stop   02/10/20 0030  cefTRIAXone (ROCEPHIN) 1 g in sodium chloride 0.9 % 100 mL IVPB     1 g 200 mL/hr over 30 Minutes Intravenous Daily at bedtime 02/10/20 0003     02/09/20 1730  vancomycin (VANCOCIN) IVPB 1000 mg/200 mL premix  Status:  Discontinued     1,000 mg 200 mL/hr over 60 Minutes Intravenous  Once 02/09/20 1723 02/09/20 1724   02/09/20 1730  piperacillin-tazobactam (ZOSYN) IVPB 3.375 g     3.375 g 100 mL/hr over 30 Minutes Intravenous  Once 02/09/20 1723 02/09/20 1818   02/09/20 1730  vancomycin (VANCOREADY) IVPB 1250 mg/250 mL     1,250 mg 166.7 mL/hr over 90 Minutes Intravenous  Once 02/09/20 1724 02/09/20 2001          Medications  Scheduled Meds: . amLODipine  5 mg Oral Daily  . brimonidine  1 drop Right Eye BID   And  . timolol  1 drop Right Eye BID  . Chlorhexidine Gluconate Cloth  6 each Topical Daily  . cholecalciferol  2,000 Units Oral Daily  . clopidogrel  75 mg Oral Daily  . cycloSPORINE  1 drop Both Eyes BID  . escitalopram  10 mg Oral Daily  . famotidine  20 mg Oral Daily  . feeding supplement  1 Container Oral BID BM  . feeding supplement (ENSURE ENLIVE)  237 mL Oral BID BM  . furosemide  40 mg Intravenous Daily  . letrozole  2.5 mg Oral Daily  .  lisinopril  20 mg Oral Daily  . mouth rinse  15 mL Mouth Rinse BID  . multivitamin with minerals  1 tablet Oral Daily  . osimertinib mesylate  80 mg Oral Daily  . pantoprazole  40 mg Oral QAC breakfast  . psyllium  1 packet Oral Daily  . Rivaroxaban  15 mg Oral BID WC   Followed by  . [START ON 03/03/2020] rivaroxaban  20 mg Oral Q supper   Continuous Infusions: . sodium chloride Stopped (02/11/20 0020)  . amiodarone 30 mg/hr (02/13/20 0800)  . cefTRIAXone (ROCEPHIN)  IV Stopped (02/12/20 2229)   PRN Meds:.ALPRAZolam, ondansetron **OR** ondansetron (ZOFRAN) IV, oxyCODONE-acetaminophen      Subjective:   Kristin [name] was seen and examined today.  Very weak, still short of breath, on 5 L O2 via nasal cannula.  On amiodarone drip.  No acute chest pain.  Patient denies dizziness, abdominal pain, N/V/D/C, new weakness, numbess, tingling.  No fevers or chills.  Objective:   Vitals:   02/13/20 0600 02/13/20 0700 02/13/20 0800 02/13/20 0823  BP: (!) 163/66 (!) 133/97 (!) 149/69   Pulse: 88 84 86   Resp: (!) 31 (!) 32 (!) 34   Temp:    98.3 F (36.8 C)  TempSrc:    Oral  SpO2: 95% 96% 95%   Weight:      Height:        Intake/Output Summary (Last 24 hours) at 02/13/2020 1230 Last data filed at 02/13/2020 0800 Gross per 24 hour  Intake 449.85 ml  Output 1050 ml  Net -600.15 ml     Wt Readings from Last 3  Encounters:  02/13/20 61.9 kg  02/07/20 58.1 kg  12/27/19 60.6 kg    Physical Exam  General: Alert and oriented x 3, NAD  Cardiovascular: S1 S2 clear, RRR. No pedal edema b/l  Respiratory: Bibasilar Rales  Gastrointestinal: Soft, nontender, nondistended, NBS  Ext: no pedal edema bilaterally  Neuro: no new deficits  Musculoskeletal: No cyanosis, clubbing  Skin: No rashes  Psych: Normal affect and demeanor, alert and oriented x3   Data Reviewed:  I have personally reviewed following labs and imaging studies  Micro Results Recent Results (from the past 240 hour(s))  Blood Culture (routine x 2)     Status: None (Preliminary result)   Collection Time: 02/09/20  5:33 PM   Specimen: BLOOD RIGHT FOREARM  Result Value Ref Range Status   Specimen Description   Final    BLOOD RIGHT FOREARM Performed at [facility] [address] [place], [place] [address]    Special Requests   Final    BOTTLES DRAWN AEROBIC AND ANAEROBIC Blood Culture results may not be optimal due to an inadequate volume of blood received in culture bottles Performed at [facility] [address], [place], [place] [address]    Culture   Final    NO GROWTH 3 DAYS Performed at [facility], [place] [address], [place], [place] [address]    Report Status PENDING  Incomplete  Blood Culture (routine x 2)     Status: None (Preliminary result)   Collection Time: 02/09/20  5:33 PM   Specimen: BLOOD  Result Value Ref Range Status   Specimen Description   Final    BLOOD RIGHT ANTECUBITAL Performed at [facility] [address] [place], [place] [address]    Special Requests   Final    BOTTLES DRAWN AEROBIC AND ANAEROBIC Blood Culture  results may not be optimal due to an inadequate volume of blood received in culture bottles Performed at [facility], [place] [address], [place], [place] [address]    Culture   Final    NO GROWTH 3  DAYS Performed at [facility], [place] [address] [place], [place] [address]    Report Status PENDING  Incomplete  Urine culture     Status: Abnormal   Collection Time: 02/09/20  8:36 PM   Specimen: In/Out Cath Urine  Result Value Ref Range Status   Specimen Description   Final    IN/OUT CATH URINE Performed at [place], [place] [address], [place], [place] [address]    Special Requests   Final    NONE Performed at [facility], [place] [address] [place], [place] [address]    Culture 5,000 COLONIES/mL ESCHERICHIA COLI (A)  Final   Report Status 02/11/2020 FINAL  Final   Organism ID, Bacteria ESCHERICHIA COLI (A)  Final      Susceptibility   Escherichia coli - MIC*    AMPICILLIN 8 SENSITIVE Sensitive     CEFAZOLIN <=4 SENSITIVE Sensitive     CEFTRIAXONE <=1 SENSITIVE Sensitive     CIPROFLOXACIN >=4 RESISTANT Resistant     GENTAMICIN >=16 RESISTANT Resistant     IMIPENEM <=0.25 SENSITIVE Sensitive     NITROFURANTOIN <=16 SENSITIVE Sensitive     TRIMETH/SULFA <=20 SENSITIVE Sensitive     AMPICILLIN/SULBACTAM 4 SENSITIVE Sensitive     PIP/TAZO <=4 SENSITIVE Sensitive     * 5,000 COLONIES/mL ESCHERICHIA COLI  SARS CORONAVIRUS 2 (TAT 6-24 HRS) Nasopharyngeal Nasopharyngeal Swab     Status: None   Collection Time: 02/10/20  3:12 AM   Specimen: Nasopharyngeal Swab  Result Value Ref Range Status   SARS Coronavirus 2 NEGATIVE NEGATIVE Final    Comment: (NOTE) SARS-CoV-2 target nucleic acids are NOT DETECTED. The SARS-CoV-2 RNA is generally detectable in upper and lower respiratory specimens during the acute phase of infection. Negative results do not preclude SARS-CoV-2 infection, do not rule out co-infections with other pathogens, and should not be used as the sole basis for treatment or other patient management decisions. Negative results must be combined with clinical observations, patient history, and epidemiological information. The  expected result is Negative. Fact Sheet for Patients: SugarRoll.be Fact Sheet for Healthcare Providers: https://www.woods-mathews.com/ This test is not yet approved or cleared by the [place] FDA and  has been authorized for detection and/or diagnosis of SARS-CoV-2 by FDA under an Emergency Use Authorization (EUA). This EUA will remain  in effect (meaning this test can be used) for the duration of the COVID-19 declaration under Section 56 4(b)(1) of the Act, 21 U.S.C. section 360bbb-3(b)(1), unless the authorization is terminated or revoked sooner. Performed at [facility], [place] [address], [place], [place] [address]   MRSA PCR Screening     Status: None   Collection Time: 02/10/20  3:32 PM   Specimen: Nasal Mucosa; Nasopharyngeal  Result Value Ref Range Status   MRSA by PCR NEGATIVE NEGATIVE Final    Comment:        The GeneXpert MRSA Assay (FDA approved for NASAL specimens only), is one component of a comprehensive MRSA colonization surveillance program. It is not intended to diagnose MRSA infection nor to guide or monitor treatment for MRSA infections. Performed at [facility], [place] [address] [place], [place] [address]   Body fluid culture (includes gram stain)  Status: None (Preliminary result)   Collection Time: 02/10/20  6:23 PM   Specimen: Pleural Fluid  Result Value Ref Range Status   Specimen Description   Final    PLEURAL LEFT Performed at [facility] [address], [place], [place] [address]    Special Requests   Final    Normal Performed at [facility], [place] [address] [place], [place] [address]    Gram Stain   Final    FEW WBC PRESENT, PREDOMINANTLY MONONUCLEAR NO ORGANISMS SEEN    Culture   Final    NO GROWTH 3 DAYS Performed at [facility] [address], [place], [place] [address]    Report Status PENDING  Incomplete     Radiology Reports CT ANGIO CHEST PE W OR WO CONTRAST  Result Date: 02/09/2020 CLINICAL DATA:  Shortness of breath, weakness, left upper lobe lung cancer EXAM: CT ANGIOGRAPHY CHEST WITH CONTRAST TECHNIQUE: Multidetector CT imaging of the chest was performed using the standard protocol during bolus administration of intravenous contrast. Multiplanar CT image reconstructions and MIPs were obtained to evaluate the vascular anatomy. CONTRAST:  108m OMNIPAQUE IOHEXOL 350 MG/ML SOLN COMPARISON:  12/24/2019 FINDINGS: Cardiovascular: This is a technically adequate evaluation of the pulmonary vasculature. There are segmental right lower lobe pulmonary emboli. Clot burden is minimal. No evidence of right heart strain. No pericardial effusion. Thoracic aorta is normal in caliber with no aneurysm or dissection. Mediastinum/Nodes: No pathologic adenopathy. There is a large hiatal hernia. Esophagus, trachea, and thyroid are stable. Lungs/Pleura: Since the previous exam, there has been significant enlargement of the left pleural effusion now measuring greater than 1 L in volume. The effusion is partially loculated, with continued areas of pleural thickening. There is dense left lower lobe consolidation consistent with atelectasis. There is a trace right pleural effusion. Patchy hypoventilatory changes are seen at the right lung base. No pneumothorax. Central airways patent. Upper Abdomen: Stable right lobe liver cyst. Remainder of the upper abdomen is unremarkable. Musculoskeletal: No acute or destructive bony lesions. Reconstructed images demonstrate no additional findings. Review of the MIP images confirms the above findings. IMPRESSION: 1. Right lower lobe segmental pulmonary emboli. Clot burden is minimal. 2. Significant interval enlargement of the partially loculated left pleural effusion, now measuring greater than 1 L in volume. Continued areas of pleural thickening suspicious for recurrent disease. 3. Trace right  pleural effusion. 4. Hiatal hernia. These results were called by telephone at the time of interpretation on 02/09/2020 at 11:40 pm [facility], who verbally acknowledged these results. Electronically Signed   By: MRanda NgoM.D.   On: 02/09/2020 23:40   DG CHEST PORT 1 VIEW  Result Date: 02/12/2020 CLINICAL DATA:  Shortness of breath EXAM: PORTABLE CHEST 1 VIEW COMPARISON:  02/11/2020 and 02/10/2020 FINDINGS: Cardiomediastinal contours remain enlarged. Bilateral effusions similar to prior study with persistent LEFT lower lobe airspace disease and obscured LEFT hemidiaphragm. Increased interstitial markings persist. No acute bone process. IMPRESSION: No interval change in appearance of the chest since prior study. Bilateral effusions with LEFT lower lobe airspace disease. Persistent increased interstitial markings cardiomegaly. Electronically Signed   By: GZetta BillsM.D.   On: 02/12/2020 18:18   DG Chest Port 1 View  Result Date: 02/11/2020 CLINICAL DATA:  Respiratory failure EXAM: PORTABLE CHEST 1 VIEW COMPARISON:  02/10/2020 FINDINGS: Cardiomegaly with vascular congestion. Small left pleural effusion. Left lower lobe atelectasis. Bilateral interstitial prominence may reflect mild interstitial edema. IMPRESSION: Cardiomegaly,  suspect mild interstitial edema. Small left effusion with left base atelectasis. Electronically Signed   By: Rolm Baptise M.D.   On: 02/11/2020 02:22   DG CHEST PORT 1 VIEW  Result Date: 02/10/2020 CLINICAL DATA:  Post left thoracentesis EXAM: PORTABLE CHEST 1 VIEW COMPARISON:  02/09/2020 FINDINGS: Interval decrease in the left effusion following thoracentesis. Small left effusion remains. No pneumothorax. Left base atelectasis. Mild cardiomegaly. Right lung clear. IMPRESSION: Decreasing left effusion following thoracentesis.  No pneumothorax. Small left effusion and left base atelectasis. Electronically Signed   By: Rolm Baptise M.D.   On: 02/10/2020 19:13    DG Chest Port 1 View  Result Date: 02/09/2020 CLINICAL DATA:  Short of breath EXAM: PORTABLE CHEST 1 VIEW COMPARISON:  06/24/2018, CT 12/24/2019 FINDINGS: Postoperative changes in the left upper lung. Moderate left pleural effusion increased compared to prior. Probable trace right effusion. Airspace disease at the left base. Obscured cardiomediastinal silhouette. Hiatal hernia. Convex opacity over the lower mediastinal silhouette. IMPRESSION: 1. Postsurgical changes of the left chest. Moderate left pleural effusion, increased compared to prior. Probable trace right pleural effusion. Airspace disease at the left lung base. Electronically Signed   By: Donavan Foil M.D.   On: 02/09/2020 18:07   ECHOCARDIOGRAM COMPLETE  Result Date: 02/10/2020    ECHOCARDIOGRAM REPORT   Patient Name:   SHARETTA RICCHIO Date of Exam: 02/10/2020 Medical Rec #:  [id number]       Height:       66.0 in Accession #:    [id number]      Weight:       127.9 lb Date of Birth:  [birth year]       BSA:          1.654 m Patient Age:    69 years        BP:           111/58 mmHg Patient Gender: F               HR:           88 bpm. Exam Location:  Inpatient Procedure: 2D Echo and Intracardiac Opacification Agent Indications:    Pulmonary Embolus 415.19 / I26.99  History:        Patient has prior history of Echocardiogram examinations, most                 recent 02/01/2016. Risk Factors:Hypertension. CKD                 Pleural effusion.  Sonographer:    Vikki Ports Turrentine Referring Phys: [id number] Rise Patience  Sonographer Comments: Technically difficult study due to poor echo windows. Image acquisition challenging due to patient body habitus. IMPRESSIONS  1. Left ventricular ejection fraction, by estimation, is 50 to 55%. The left ventricle has low normal function. The left ventricle has no regional wall motion abnormalities. Left ventricular diastolic parameters are consistent with Grade I diastolic dysfunction (impaired relaxation).  2.  Right ventricular systolic function is moderately reduced. The right ventricular size is moderately enlarged. D-shaped septum suggestive of RV pressure/volume overload. No complete TR doppler jet so unable to estimate PA systolic pressure.  3. The aortic valve is tricuspid. Aortic valve regurgitation is trivial. No aortic stenosis is present.  4. The mitral valve is normal in structure. No evidence of mitral valve regurgitation. No evidence of mitral stenosis.  5. The inferior vena cava is dilated in size with <50% respiratory variability, suggesting right atrial pressure of 15 mmHg.  6. Left-sided pleural effusion noted. Trivial pericardial effusion. FINDINGS  Left Ventricle: Left ventricular ejection fraction, by estimation, is 50 to 55%. The left ventricle has low normal function. The left ventricle has no regional wall motion abnormalities. Definity contrast agent was given IV to delineate the left ventricular endocardial borders. The left ventricular internal cavity size was normal in size. There is no left ventricular hypertrophy. Left ventricular diastolic parameters are consistent with Grade I diastolic dysfunction (impaired relaxation). Right Ventricle: The right ventricular size is moderately enlarged. No increase in right ventricular wall thickness. Right ventricular systolic function is moderately reduced. Tricuspid regurgitation signal is inadequate for assessing PA pressure. Left Atrium: Left atrial size was normal in size. Right Atrium: Right atrial size was normal in size. Pericardium: Trivial pericardial effusion is present. Mitral Valve: The mitral valve is normal in structure. No evidence of mitral valve regurgitation. No evidence of mitral valve stenosis. Tricuspid Valve: The tricuspid valve is normal in structure. Tricuspid valve regurgitation is not demonstrated. Aortic Valve: The aortic valve is tricuspid. Aortic valve regurgitation is trivial. No aortic stenosis is present. Pulmonic Valve:  The pulmonic valve was normal in structure. Pulmonic valve regurgitation is not visualized. Aorta: The aortic root is normal in size and structure. Venous: The inferior vena cava is dilated in size with less than 50% respiratory variability, suggesting right atrial pressure of 15 mmHg. IAS/Shunts: No atrial level shunt detected by color flow Doppler.  LEFT VENTRICLE PLAX 2D LVOT diam:     1.80 cm  Diastology LV SV:         36       LV e' lateral:   9.36 cm/s LV SV Index:   22       LV E/e' lateral: 3.6 LVOT Area:     2.54 cm LV e' medial:    7.83 cm/s                         LV E/e' medial:  4.3  LEFT ATRIUM           Index LA Vol (A4C): 36.2 ml 21.89 ml/m  AORTIC VALVE LVOT Vmax:   84.90 cm/s LVOT Vmean:  61.100 cm/s LVOT VTI:    0.141 m MITRAL VALVE MV Area (PHT): 3.37 cm    SHUNTS MV Decel Time: 225 msec    Systemic VTI:  0.14 m MV E velocity: 33.40 cm/s  Systemic Diam: 1.80 cm MV A velocity: 59.70 cm/s MV E/A ratio:  0.56 Loralie Champagne MD Electronically signed by Loralie Champagne MD Signature Date/Time: 02/10/2020/4:49:30 PM    Final    CT RENAL STONE STUDY  Result Date: 02/09/2020 CLINICAL DATA:  Flank pain, kidney stone suspected. EXAM: CT ABDOMEN AND PELVIS WITHOUT CONTRAST TECHNIQUE: Multidetector CT imaging of the abdomen and pelvis was performed following the standard protocol without IV contrast. COMPARISON:  Abdominal CT 12/24/2019. Concurrent chest CTA, reported separately. FINDINGS: Lower chest: Left pleural effusion and basilar consolidation assessed on concurrent chest CT, reported separately. Moderately large hiatal hernia, partially obscured by motion artifact. Hepatobiliary: Cyst in the right lobe of the liver is grossly stable, motion artifact partially obscures evaluation. There is some high-density material within the gallbladder that may represent sludge. More detailed evaluation is limited given motion. No biliary dilatation. Pancreas: Motion artifact through the pancreas limits  assessment. No ductal dilatation. No obvious peripancreatic inflammation Spleen: Normal in size with small cleft medially. Adrenals/Urinary Tract: No adrenal nodule. No hydronephrosis. No  renal or ureteral calculi. Mild bilateral renal parenchymal thinning and cortical scarring in the lower right kidney. There is symmetric bilateral perinephric edema. Urinary bladder is unremarkable, no bladder stone. Stomach/Bowel: Moderately large hiatal hernia. Motion limits more detailed assessment. No evidence of bowel obstruction. No obvious bowel inflammation, lack of contrast and motion limits detailed bowel assessment. Descending colostomy. Mild descending colonic diverticulosis without diverticulitis chain sutures noted in the cecum. Vascular/Lymphatic: Aorto bi-iliac atherosclerosis. No grossly enlarged abdominopelvic lymph nodes, detailed assessment limited given motion and lack of contrast. Reproductive: Calcified uterine fibroids. No adnexal mass. Other: Mild generalized body wall edema. No ascites. No free air. Musculoskeletal: Left hip arthroplasty. Bones are under mineralized. Stable bone island in the mid sacrum. Multilevel degenerative change in the lumbar spine. IMPRESSION: 1. Motion limited exam. 2. No renal stones or obstructive uropathy. Symmetric bilateral perinephric edema is nonspecific. 3. Moderately large hiatal hernia. 4. High-density material within the gallbladder may represent sludge. More detailed evaluation is limited given motion artifact. Aortic Atherosclerosis (ICD10-I70.0). Electronically Signed   By: Keith Rake M.D.   On: 02/09/2020 23:30   VAS [place] LOWER EXTREMITY VENOUS (DVT)  Result Date: 02/11/2020  Lower Venous DVTStudy Indications: Pulmonary embolism.  Performing Technologist: June Leap RDMS, RVT  Examination Guidelines: A complete evaluation includes B-mode imaging, spectral Doppler, color Doppler, and power Doppler as needed of all accessible portions of each vessel. Bilateral  testing is considered an integral part of a complete examination. Limited examinations for reoccurring indications may be performed as noted. The reflux portion of the exam is performed with the patient in reverse Trendelenburg.  +---------+---------------+---------+-----------+----------+--------------+ RIGHT    CompressibilityPhasicitySpontaneityPropertiesThrombus Aging +---------+---------------+---------+-----------+----------+--------------+ CFV      Full           Yes      Yes                                 +---------+---------------+---------+-----------+----------+--------------+ SFJ      Full                                                        +---------+---------------+---------+-----------+----------+--------------+ FV Prox  Full                                                        +---------+---------------+---------+-----------+----------+--------------+ FV Mid   Full                                                        +---------+---------------+---------+-----------+----------+--------------+ FV DistalFull                                                        +---------+---------------+---------+-----------+----------+--------------+ PFV      Full                                                        +---------+---------------+---------+-----------+----------+--------------+  POP      Full           Yes      Yes                                 +---------+---------------+---------+-----------+----------+--------------+ PTV      Full                                                        +---------+---------------+---------+-----------+----------+--------------+ PERO     Full                                                        +---------+---------------+---------+-----------+----------+--------------+   +---------+---------------+---------+-----------+----------+--------------+ LEFT      CompressibilityPhasicitySpontaneityPropertiesThrombus Aging +---------+---------------+---------+-----------+----------+--------------+ CFV      Full           Yes      Yes                                 +---------+---------------+---------+-----------+----------+--------------+ SFJ      Full                                                        +---------+---------------+---------+-----------+----------+--------------+ FV Prox  Full                                                        +---------+---------------+---------+-----------+----------+--------------+ FV Mid   Full                                                        +---------+---------------+---------+-----------+----------+--------------+ FV DistalFull                                                        +---------+---------------+---------+-----------+----------+--------------+ PFV      Full                                                        +---------+---------------+---------+-----------+----------+--------------+ POP      Full           Yes      Yes                                 +---------+---------------+---------+-----------+----------+--------------+  PTV      None                                                        +---------+---------------+---------+-----------+----------+--------------+ PERO     None                                                        +---------+---------------+---------+-----------+----------+--------------+     Summary: RIGHT: - There is no evidence of deep vein thrombosis in the lower extremity.  - No cystic structure found in the popliteal fossa.  LEFT: - Findings consistent with acute deep vein thrombosis involving the left posterior tibial veins, and left peroneal veins. - No cystic structure found in the popliteal fossa.  *See table(s) above for measurements and observations. Electronically signed by Harold Barban MD on 02/11/2020 at  5:06:34 PM.    Final     Lab Data:  CBC: Recent Labs  Lab 02/07/20 1021 02/07/20 1021 02/09/20 1723 02/09/20 1723 02/10/20 4825 02/11/20 0043 02/11/20 0511 02/12/20 0259 02/13/20 0251  WBC 11.5*  --  12.1*  --  16.7*  --  12.1* 14.4* 15.9*  NEUTROABS 9.2*  --  9.6*  --   --   --   --   --   --   HGB 11.7*  --  10.6*   < > 8.5* 9.8* 8.3* 9.3* 9.8*  HCT 36.3   < > 33.5*   < > 27.2* 31.1* 26.0* 28.3* 30.1*  MCV 90.5   < > 91.0  --  91.9  --  91.5 87.9 88.3  PLT 231  --  165  --  123*  --  160 182 170   < > = values in this interval not displayed.   Basic Metabolic Panel: Recent Labs  Lab 02/10/20 0332 02/11/20 0043 02/11/20 0511 02/12/20 0259 02/13/20 0251  NA 139 135 135  136 134* 136  K 4.7 4.8 4.6  4.6 4.6 3.7  CL 109 105 104  106 103 105  CO2 21* 18* 20*  21* 21* 22  GLUCOSE 169* 158* 143*  144* 122* 123*  BUN 25* 30* 33*  35* 36* 32*  CREATININE 0.97 1.03* 0.98  0.99 1.01* 0.93  CALCIUM 9.1 9.3 9.5  9.6 9.4 8.9  MG  --  2.1  --   --   --    GFR: Estimated Creatinine Clearance: 45.2 mL/min (by C-G formula based on SCr of 0.93 mg/dL). Liver Function Tests: Recent Labs  Lab 02/07/20 1021 02/09/20 1723 02/11/20 0511  AST 25 55* 69*  67*  ALT 27 58* 71*  69*  ALKPHOS 139* 186* 138*  133*  BILITOT 0.5 0.7 0.6  0.5  PROT 7.1 6.6 6.0*  5.9*  ALBUMIN 3.1* 2.9* 3.2*  3.1*   No results for input(s): LIPASE, AMYLASE in the last 168 hours. No results for input(s): AMMONIA in the last 168 hours. Coagulation Profile: Recent Labs  Lab 02/09/20 1723  INR 1.3*   Cardiac Enzymes: No results for input(s): CKTOTAL, CKMB, CKMBINDEX, TROPONINI in the last 168 hours. BNP (last 3 results) No results for input(s): PROBNP in the last 8760  hours. HbA1C: No results for input(s): HGBA1C in the last 72 hours. CBG: No results for input(s): GLUCAP in the last 168 hours. Lipid Profile: No results for input(s): CHOL, HDL, LDLCALC, TRIG, CHOLHDL, LDLDIRECT in  the last 72 hours. Thyroid Function Tests: Recent Labs    02/11/20 0511 02/11/20 1859  TSH 0.015*  0.016*  --   FREET4 1.49*  --   T3FREE  --  2.4   Anemia Panel: No results for input(s): VITAMINB12, FOLATE, FERRITIN, TIBC, IRON, RETICCTPCT in the last 72 hours. Urine analysis:    Component Value Date/Time   COLORURINE YELLOW 02/09/2020 2036   APPEARANCEUR HAZY (A) 02/09/2020 2036   LABSPEC 1.031 (H) 02/09/2020 2036   PHURINE 5.0 02/09/2020 2036   GLUCOSEU NEGATIVE 02/09/2020 2036   HGBUR NEGATIVE 02/09/2020 2036   BILIRUBINUR NEGATIVE 02/09/2020 2036   KETONESUR NEGATIVE 02/09/2020 2036   PROTEINUR 100 (A) 02/09/2020 2036   UROBILINOGEN 0.2 04/29/2013 2243   NITRITE NEGATIVE 02/09/2020 2036   LEUKOCYTESUR LARGE (A) 02/09/2020 2036     Josie Burleigh M.D. [facility] 02/13/2020, 12:30 PM   Call night coverage person covering after 7pm

## 2020-02-14 DIAGNOSIS — I480 Paroxysmal atrial fibrillation: Secondary | ICD-10-CM | POA: Diagnosis not present

## 2020-02-14 DIAGNOSIS — N390 Urinary tract infection, site not specified: Secondary | ICD-10-CM | POA: Diagnosis not present

## 2020-02-14 DIAGNOSIS — J90 Pleural effusion, not elsewhere classified: Secondary | ICD-10-CM | POA: Diagnosis not present

## 2020-02-14 DIAGNOSIS — J9601 Acute respiratory failure with hypoxia: Secondary | ICD-10-CM | POA: Diagnosis not present

## 2020-02-14 LAB — BODY FLUID CULTURE
Culture: NO GROWTH
Special Requests: NORMAL

## 2020-02-14 LAB — CULTURE, BLOOD (ROUTINE X 2)
Culture: NO GROWTH
Culture: NO GROWTH

## 2020-02-14 LAB — BASIC METABOLIC PANEL
Anion gap: 12 (ref 5–15)
BUN: 38 mg/dL — ABNORMAL HIGH (ref 8–23)
CO2: 22 mmol/L (ref 22–32)
Calcium: 8.8 mg/dL — ABNORMAL LOW (ref 8.9–10.3)
Chloride: 107 mmol/L (ref 98–111)
Creatinine, Ser: 0.98 mg/dL (ref 0.44–1.00)
GFR calc Af Amer: >60 mL/min (ref >60)
GFR calc non Af Amer: 54 mL/min — ABNORMAL LOW (ref >60)
Glucose, Bld: 116 mg/dL — ABNORMAL HIGH (ref 70–99)
Potassium: 3.9 mmol/L (ref 3.5–5.1)
Sodium: 141 mmol/L (ref 135–145)

## 2020-02-14 LAB — CBC
HCT: 29.7 % — ABNORMAL LOW (ref 36.0–46.0)
Hemoglobin: 9.7 g/dL — ABNORMAL LOW (ref 12.0–15.0)
MCH: 29 pg (ref 26.0–34.0)
MCHC: 32.7 g/dL (ref 30.0–36.0)
MCV: 88.7 fL (ref 80.0–100.0)
Platelets: 185 10*3/uL (ref 150–400)
RBC: 3.35 MIL/uL — ABNORMAL LOW (ref 3.87–5.11)
RDW: 13.7 % (ref 11.5–15.5)
WBC: 16.8 10*3/uL — ABNORMAL HIGH (ref 4.0–10.5)
nRBC: 0.2 % (ref 0.0–0.2)

## 2020-02-14 MED ORDER — FUROSEMIDE 40 MG PO TABS
40.0000 mg | ORAL_TABLET | Freq: Every day | ORAL | Status: DC
  Administered 2020-02-14 – 2020-02-15 (×2): 40 mg via ORAL
  Filled 2020-02-14 (×2): qty 1

## 2020-02-14 MED ORDER — PRO-STAT SUGAR FREE PO LIQD
30.0000 mL | Freq: Two times a day (BID) | ORAL | Status: DC
  Administered 2020-02-15 – 2020-02-19 (×8): 30 mL via ORAL
  Filled 2020-02-14 (×9): qty 30

## 2020-02-14 MED ORDER — BENZOCAINE 10 % MT GEL
Freq: Four times a day (QID) | OROMUCOSAL | Status: DC | PRN
  Filled 2020-02-14: qty 9.4

## 2020-02-14 NOTE — Progress Notes (Signed)
Brief oncology note:  The patient is resting quietly at the time of my visit.  She sat up for about an hour and was exhausted after doing this.  Her shortness of breath has improved.  Has now been switched from heparin to Xarelto.  She has restarted her Tagrisso 80 mg daily and also remains on letrozole.  I again discussed with her daughter that we can obtain an MRI of the brain with and without contrast when she is more medically stable.  This was ordered previously as an outpatient for restaging purposes.  She currently has a PET scan scheduled for this Wednesday, but we will likely need to reschedule this as she remains in the ICU.  We will continue to check in with the patient and her family periodically.   Mikey Bussing, DNP, AGPCNP-BC, AOCNP Mon/Tues/Thurs/Fri 7am-5pm; Off Wednesdays Cell: [phone]

## 2020-02-14 NOTE — Progress Notes (Signed)
NAME:  Kristin [name], MRN:  [id number], DOB:  04/11/1941, LOS: 4 ADMISSION DATE:  02/09/2020, CONSULTATION DATE:  02/10/2020 REFERRING MD:  Dr. Reesa Chew, CHIEF COMPLAINT:  Fever and shortness of breath   Brief History   79yo female with extensive PMH who presented with complaint of shortness of breath, weakness, weight loss, and fever. Found to have large pleural effusion and PE.   History of present illness   Kristin [name] is a 79 yo female with PMH significant for metastatic non-small cell lung cancer, colon cancer s/p colostomy, breast cancer, hypertension, prior CVA, and anxiety who presented to the ED due to complaints of shortness of breath, weakness, fever, and weight loss. Per patient she was seen by oncologist 3 days prior to admission for follow up where she received IV fluids and empirically started on Macrobid for presumed UTI. She additionally reports left sided back/falnk pain. Patient continued to feel poor with eventually development of fever of 103 which prompted presentation to the ER.   On admission lab works significant for Creatinine 1.21 (baseline 0.9-1.0), elevated alkaline phos of 182, elevated WBC of 12.1, hgb 10.6 elevated BNP of 717.3. Vitals on admission significant for tachypnea and mild tachycardia meeting criteria for SIRS. Workup thus far reveals acute PE with CT evidence of RV strain as well as significant enlargement large left pleural effusion.   PCCM was consulted for assistance in management of pleural effusion and PE Given SIRS criteria with presence of left pleural effusion patient needs diagnostic thoracentesis to rule out empyema.   Past Medical History  Anemia of chronic disease Breast cancer Colon cancer NSCL cancer  CVA Hypertension  Significant Hospital Events   Admitted 4/22  Consults:    Procedures:  Left thora 4/22 > 972ml blood pleural fluid removed   Significant Diagnostic Tests:  CTA chest 4/22 >  evidence of RV strain as well as  significant enlargement large left pleural effusion.   ECHO 4/22 > 1. Left ventricular ejection fraction, by estimation, is 50 to 55%. The  left ventricle has low normal function. The left ventricle has no regional  wall motion abnormalities. Left ventricular diastolic parameters are  consistent with Grade I diastolic  dysfunction (impaired relaxation).  2. Right ventricular systolic function is moderately reduced. The right  ventricular size is moderately enlarged. D-shaped septum suggestive of RV  pressure/volume overload. No complete TR doppler jet so unable to estimate  PA systolic pressure.  3. The aortic valve is tricuspid. Aortic valve regurgitation is trivial.  No aortic stenosis is present.  4. The mitral valve is normal in structure. No evidence of mitral valve  regurgitation. No evidence of mitral stenosis.  5. The inferior vena cava is dilated in size with <50% respiratory  variability, suggesting right atrial pressure of 15 mmHg.  6. Left-sided pleural effusion noted. Trivial pericardial effusion.   Micro Data:  Urine culture 4/22 > 4,000 positive gram negative rods Blood culture 4/22 > COVID 4/22 > negative  Antimicrobials:  Ceftriaxone 4/22  >> 4/25 Flagyl 4/25 >> Zosyn 4/25 >>   Interim history/subjective:  Resting comfortably.  Remains on supplemental oxygen.  Objective   Blood pressure (!) 141/58, pulse 84, temperature 98.4 F (36.9 C), temperature source Oral, resp. rate (!) 36, height 5\' 6"  (1.676 m), weight 61.9 kg, SpO2 90 %.        Intake/Output Summary (Last 24 hours) at 02/14/2020 1247 Last data filed at 02/14/2020 0800 Gross per 24 hour  Intake 1069.17 ml  Output  1255 ml  Net -185.83 ml   Filed Weights   02/12/20 0500 02/13/20 0500 02/14/20 0500  Weight: 64.1 kg 61.9 kg 61.9 kg    Examination:  General - alert Eyes - pupils reactive ENT - no sinus tenderness, no stridor Cardiac - irregular Chest - decreased BS at bases Lt > Rt  Abdomen - soft, non tender, + bowel sounds Extremities - no cyanosis, clubbing, or edema Skin - no rashes Neuro - normal strength, moves extremities, follows commands   Resolved Hospital Problem list     Assessment & Plan:   Lt exudate pleural effusion s/p thoracentesis. - f/u pleural fluid cytology - f/u CXR 4/27  Acute right lower lobe PE with acute cor pulmonale. Lt leg DVT. - continue xarelto  E coli UTI. - ABx per primary team  A fib with RVR. Acute on chronic diastolic CHF. - per cardiology  Best practice:  Diet: Heart healthy  DVT prophylaxis: xarelto GI prophylaxis: protonix Mobility: Bedrest  Code Status: Full Disposition: ICU   Labs    CMP Latest Ref Rng & Units 02/14/2020 02/13/2020 02/12/2020  Glucose 70 - 99 mg/dL 116(H) 123(H) 122(H)  BUN 8 - 23 mg/dL 38(H) 32(H) 36(H)  Creatinine 0.44 - 1.00 mg/dL 0.98 0.93 1.01(H)  Sodium 135 - 145 mmol/L 141 136 134(L)  Potassium 3.5 - 5.1 mmol/L 3.9 3.7 4.6  Chloride 98 - 111 mmol/L 107 105 103  CO2 22 - 32 mmol/L 22 22 21(L)  Calcium 8.9 - 10.3 mg/dL 8.8(L) 8.9 9.4  Total Protein 6.5 - 8.1 g/dL - - -  Total Bilirubin 0.3 - 1.2 mg/dL - - -  Alkaline Phos 38 - 126 U/L - - -  AST 15 - 41 U/L - - -  ALT 0 - 44 U/L - - -    CBC Latest Ref Rng & Units 02/14/2020 02/13/2020 02/12/2020  WBC 4.0 - 10.5 K/uL 16.8(H) 15.9(H) 14.4(H)  Hemoglobin 12.0 - 15.0 g/dL 9.7(L) 9.8(L) 9.3(L)  Hematocrit 36.0 - 46.0 % 29.7(L) 30.1(L) 28.3(L)  Platelets 150 - 400 K/uL 185 170 182    Signature:  Chesley Mires, MD [place] Pager - [phone] 02/14/2020, 12:53 PM

## 2020-02-14 NOTE — Progress Notes (Addendum)
Progress Note  Patient Name: Kristin [name] Date of Encounter: 02/14/2020  Primary Cardiologist: Kate Sable, MD   Subjective   Sitting upright in bed and very tired.  No chest pain.   Inpatient Medications    Scheduled Meds: . amiodarone  200 mg Oral Daily  . amLODipine  5 mg Oral Daily  . brimonidine  1 drop Right Eye BID   And  . timolol  1 drop Right Eye BID  . Chlorhexidine Gluconate Cloth  6 each Topical Daily  . cholecalciferol  2,000 Units Oral Daily  . clopidogrel  75 mg Oral Daily  . cycloSPORINE  1 drop Both Eyes BID  . escitalopram  10 mg Oral Daily  . famotidine  20 mg Oral Daily  . feeding supplement  1 Container Oral BID BM  . feeding supplement (ENSURE ENLIVE)  237 mL Oral BID BM  . furosemide  40 mg Intravenous Daily  . letrozole  2.5 mg Oral Daily  . lisinopril  20 mg Oral Daily  . mouth rinse  15 mL Mouth Rinse BID  . multivitamin with minerals  1 tablet Oral Daily  . osimertinib mesylate  80 mg Oral Daily  . pantoprazole  40 mg Oral QAC breakfast  . psyllium  1 packet Oral Daily  . Rivaroxaban  15 mg Oral BID WC   Followed by  . [START ON 03/03/2020] rivaroxaban  20 mg Oral Q supper   Continuous Infusions: . sodium chloride Stopped (02/11/20 0020)  . cefTRIAXone (ROCEPHIN)  IV Stopped (02/13/20 2151)  . metronidazole Stopped (02/14/20 0606)  . vancomycin     PRN Meds: ALPRAZolam, ondansetron **OR** ondansetron (ZOFRAN) IV, oxyCODONE-acetaminophen   Vital Signs    Vitals:   02/14/20 0300 02/14/20 0400 02/14/20 0500 02/14/20 0600  BP: (!) 137/59 (!) 113/58  (!) 151/55  Pulse: 79 77  78  Resp: (!) 29 (!) 34  (!) 31  Temp:  97.7 F (36.5 C)    TempSrc:  Oral    SpO2: 95% 95%  94%  Weight:   61.9 kg   Height:        Intake/Output Summary (Last 24 hours) at 02/14/2020 1003 Last data filed at 02/14/2020 0600 Gross per 24 hour  Intake 1300.93 ml  Output 1255 ml  Net 45.93 ml   Last 3 Weights 02/14/2020 02/13/2020 02/12/2020   Weight (lbs) 136 lb 7.4 oz 136 lb 7.4 oz 141 lb 5 oz  Weight (kg) 61.9 kg 61.9 kg 64.1 kg      Telemetry    SR no a fib one burst of SVT  8 beats this AM at 150  - Personally Reviewed  ECG    No new - Personally Reviewed  Physical Exam   GEN: No acute distress.  But uncomfortable Neck: No JVD sitting up right Cardiac: RRR, no murmurs, rubs, or gallops.  Respiratory: Clear to auscultation bilaterally. GI: Soft, nontender, non-distended  MS: No edema; No deformity. Neuro:  Nonfocal  Psych: Normal to flat affect   Labs    High Sensitivity Troponin:   Recent Labs  Lab 02/10/20 0003 02/10/20 0224  TROPONINIHS 681* 637*      Chemistry Recent Labs  Lab 02/07/20 1021 02/07/20 1021 02/09/20 1723 02/10/20 0332 02/11/20 0511 02/11/20 0511 02/12/20 0259 02/13/20 0251 02/14/20 0245  NA 137   < > 135   < > 135  136   < > 134* 136 141  K 4.4   < > 4.7   < >  4.6  4.6   < > 4.6 3.7 3.9  CL 102   < > 102   < > 104  106   < > 103 105 107  CO2 23   < > 22   < > 20*  21*   < > 21* 22 22  GLUCOSE 126*   < > 182*   < > 143*  144*   < > 122* 123* 116*  BUN 24*   < > 28*   < > 33*  35*   < > 36* 32* 38*  CREATININE 1.13*  --  1.21*   < > 0.98  0.99   < > 1.01* 0.93 0.98  CALCIUM 9.9   < > 9.6   < > 9.5  9.6   < > 9.4 8.9 8.8*  PROT 7.1  --  6.6  --  6.0*  5.9*  --   --   --   --   ALBUMIN 3.1*  --  2.9*  --  3.2*  3.1*  --   --   --   --   AST 25  --  55*  --  69*  67*  --   --   --   --   ALT 27  --  58*  --  71*  69*  --   --   --   --   ALKPHOS 139*  --  186*  --  138*  133*  --   --   --   --   BILITOT 0.5  --  0.7  --  0.6  0.5  --   --   --   --   GFRNONAA 46*  --  42*   < > 54*  54*   < > 53* 58* 54*  GFRAA 53*  --  49*   < > >60  >60   < > >60 >60 >60  ANIONGAP 12   < > 11   < > 11  9   < > 10 9 12    < > = values in this interval not displayed.     Hematology Recent Labs  Lab 02/12/20 0259 02/13/20 0251 02/14/20 0245  WBC 14.4* 15.9* 16.8*  RBC  3.22* 3.41* 3.35*  HGB 9.3* 9.8* 9.7*  HCT 28.3* 30.1* 29.7*  MCV 87.9 88.3 88.7  MCH 28.9 28.7 29.0  MCHC 32.9 32.6 32.7  RDW 13.3 13.6 13.7  PLT 182 170 185    BNP Recent Labs  Lab 02/10/20 1339  BNP 717.3*     DDimer No results for input(s): DDIMER in the last 168 hours.   Radiology    DG CHEST PORT 1 VIEW  Result Date: 02/12/2020 CLINICAL DATA:  Shortness of breath EXAM: PORTABLE CHEST 1 VIEW COMPARISON:  02/11/2020 and 02/10/2020 FINDINGS: Cardiomediastinal contours remain enlarged. Bilateral effusions similar to prior study with persistent LEFT lower lobe airspace disease and obscured LEFT hemidiaphragm. Increased interstitial markings persist. No acute bone process. IMPRESSION: No interval change in appearance of the chest since prior study. Bilateral effusions with LEFT lower lobe airspace disease. Persistent increased interstitial markings cardiomegaly. Electronically Signed   By: Zetta Bills M.D.   On: 02/12/2020 18:18    Cardiac Studies   02/10/20  Echo IMPRESSIONS    1. Left ventricular ejection fraction, by estimation, is 50 to 55%. The  left ventricle has low normal function. The left ventricle has no regional  wall motion abnormalities. Left ventricular  diastolic parameters are  consistent with Grade I diastolic  dysfunction (impaired relaxation).  2. Right ventricular systolic function is moderately reduced. The right  ventricular size is moderately enlarged. D-shaped septum suggestive of RV  pressure/volume overload. No complete TR doppler jet so unable to estimate  PA systolic pressure.  3. The aortic valve is tricuspid. Aortic valve regurgitation is trivial.  No aortic stenosis is present.  4. The mitral valve is normal in structure. No evidence of mitral valve  regurgitation. No evidence of mitral stenosis.  5. The inferior vena cava is dilated in size with <50% respiratory  variability, suggesting right atrial pressure of 15 mmHg.  6.  Left-sided pleural effusion noted. Trivial pericardial effusion.   FINDINGS  Left Ventricle: Left ventricular ejection fraction, by estimation, is 50  to 55%. The left ventricle has low normal function. The left ventricle has  no regional wall motion abnormalities. Definity contrast agent was given  IV to delineate the left  ventricular endocardial borders. The left ventricular internal cavity size  was normal in size. There is no left ventricular hypertrophy. Left  ventricular diastolic parameters are consistent with Grade I diastolic  dysfunction (impaired relaxation).   Right Ventricle: The right ventricular size is moderately enlarged. No  increase in right ventricular wall thickness. Right ventricular systolic  function is moderately reduced. Tricuspid regurgitation signal is  inadequate for assessing PA pressure.   Left Atrium: Left atrial size was normal in size.   Right Atrium: Right atrial size was normal in size.   Pericardium: Trivial pericardial effusion is present.   Mitral Valve: The mitral valve is normal in structure. No evidence of  mitral valve regurgitation. No evidence of mitral valve stenosis.   Tricuspid Valve: The tricuspid valve is normal in structure. Tricuspid  valve regurgitation is not demonstrated.   Aortic Valve: The aortic valve is tricuspid. Aortic valve regurgitation is  trivial. No aortic stenosis is present.   Pulmonic Valve: The pulmonic valve was normal in structure. Pulmonic valve  regurgitation is not visualized.   Aorta: The aortic root is normal in size and structure.   Venous: The inferior vena cava is dilated in size with less than 50%  respiratory variability, suggesting right atrial pressure of 15 mmHg.   IAS/Shunts: No atrial level shunt detected by color flow Doppler.      Patient Profile     79 y.o. female PMH significant for metastatic non-small cell lung cancer, colon cancer s/p colostomy, breast cancer, hypertension,  prior CVA, and anxiety --admitted 02/09/20 with fever/sob/pulmonary embolism/atrial fib now back in NSR. Pleural effusion noted  Assessment & Plan    PAF- maintaining SR , changed to po amiodarone yeasterday at 200 mg daily.  This AM 8 beat burst of SVT at 150   PE,-Acute continue xarelto   Pl effusion- large  followed by pulmonary  And post thoracentesis.   ?Hyperthyroidism -TSH low 0.01, free T4 elevated 1.49, T3 WNL, also on amiodarone currently -Will need thyroid studies in 4 weeks, if TSH continues to be low with elevated free T4, may need to start methimazole, will defer to PCP  Elevated troponin 681 pk, most likely from a fib and PE.  No RWMA on EKG.   Hx CVA on plavix.         For questions or updates, please contact [place] Please consult [email or web address] for contact info under        Signed, Cecilie Kicks, NP  02/14/2020, 10:03 AM  History and all data above reviewed.  Patient examined.  I agree with the findings as above.   She is weak but no acute complaints.   The patient exam reveals COR:RRR  ,  Lungs: Decreased breath sounds on the left base  ,  Abd: Positive bowel sounds, no rebound no guarding, Ext No edema  .  All available labs, radiology testing, previous records reviewed. Agree with documented assessment and plan.   Atrial fib:  Maintaining NSR.  On anticoagulation. Continuing Plavix with Xarelto given the CVA.   Continue amiodarone.  No change in therapy.  We will follow as needed.     Jeneen Rinks Wolf Boulay  1:38 PM  02/14/2020

## 2020-02-14 NOTE — Progress Notes (Signed)
[facility]                                                                              Patient Demographics  Kristin [name], is a 79 y.o. female, DOB - December 28, 1940, IRW:[id number]  Admit date - 02/09/2020   Admitting Physician Rise Patience, MD  Outpatient Primary MD for the patient is Eber Hong, MD  Outpatient specialists:   LOS - 4  days   Medical records reviewed and are as summarized below:    Chief Complaint  Patient presents with  . Shortness of Breath  . Weakness       Brief summary   Kristin [name] a 79 y.o.femalewithhistory of metastatic non-small cell lung cancer with history of colon cancer status post colostomy and history of breast cancer with history of hypertension CVA has been experiencing increasing shortness of breath and weakness over the last few days with increasing weight loss had followed up with her oncologist 3 days ago at that time patient was placed on fluids and empirically started on Macrobid. Despite which patient is still having increasing weakness and the last 2 days had fevers noted at home with temperature 101 F. Which prompted patient to come to the ER.  On admission, creatinine 1.2, elevated alk phos, leukocytosis, hemoglobin 10.6, BNP elevated 717.3.  Patient met criteria for SIRS, work-up revealed acute PE, large left pleural effusion. Underwent thoracentesis on 4/22, 950 cc of bloody pleural fluid removed PCCM following.  Developed persistent A. fib with RVR on 4/23, was placed on IV amiodarone and Cardizem, cardiology was consulted   Assessment & Plan    Principal Problem:   Acute right lower lobe pulmonary embolism ([place]), acute LLE DVT, acute hypoxic respiratory failure -In the setting of known malignancies. -CTA chest showed right lower lobe segmental pulmonary emboli, minimal clot burden, partially loculated left pleural effusion, measuring> 1 L, continued areas of pleural thickening suspicious  for recurrent disease -Lower extremity venous Doppler showed acute DVT involving the left posterior tibial vein, left peroneal vein -2D echo showed EF of 50 to 76%, grade 1 diastolic dysfunction, RV EF moderately reduced with RV volume overload -Initially was placed on IV heparin drip, now transition to oral Xarelto -Wean O2 as tolerated, sats 93% on 4 L, repeat chest x-ray  Active Problems: Loculated left pleural effusion -Status post thoracentesis by pulmonology on 4/22, 950 cc of bloody pleural fluid removed -Fluid cultures negative so far, cytology pending.  Continue IV ceftriaxone -Pulmonology following -Pleural fluid studies exudative, possible empyema, was spiking fevers on 4/25, antibiotic coverage was broadened, added Vanc and Flagyl.  Procalcitonin 0.37   Atrial fibrillation with RVR, paroxysmal -Patient was placed on amiodarone drip, diltiazem drip, heparin drip -Converted to normal sinus rhythm, cardiology following, recommended continue IV amiodarone drip today and switch to oral tomorrow if still in NSR -Continue Xarelto  Acute on chronic diastolic CHF -Likely precipitated due to acute PE and A. fib with RVR -Chest x-ray 4/24 showed bilateral pleural effusions, patient had respiratory distress, needed Lasix 20 mg IV x1 -Placed on IV Lasix for 2 days, I's and O's with 2.8 L positive.  Transition to oral Lasix today.  E. coli UTI -Urine culture showed E. coli, on Rocephin -Met SIRS criteria at the time of admission  Essential hypertension BP stable, continue lisinopril, amlodipine, Lasix  CKD stage IIIa Creatinine currently stable at baseline, monitor closely with diuresis  Normocytic anemia -Likely related to malignancy H&H currently stable  NSCLC of the lung, history of adenocarcinoma, status post colostomy History of breast CA -Followed by oncology, Dr. Julien Nordmann  ?Hyperthyroidism -TSH low 0.01, free T4 elevated 1.49, T3 WNL, also on amiodarone  currently -Will need thyroid studies in 4 weeks, if TSH continues to be low with elevated free T4, may need to start methimazole, will defer to PCP  Elevated LFTs -AST/ALT/alk phos slightly up.  CT renal showed GB with sludge Hepatitis panel negative  Urinary retention -Foley  Generalized debility -Start PT OT evaluation today, improve mobility, needs to be out of bed  Moderate to severe protein calorie malnutrition Estimated body mass index is 22.03 kg/m as calculated from the following:   Height as of this encounter: _0  (1.676 m).   Weight as of this encounter: 61.9 kg.  Code Status: Full CODE STATUS DVT Prophylaxis: Xarelto Family Communication: Discussed all imaging results, lab results, explained to the patient and daughter at bedside   Disposition Plan:     Status is: Inpatient  Remains inpatient appropriate because:IV treatments appropriate due to intensity of illness or inability to take PO   Dispo: The patient is from: Home              Anticipated d/c is to: Home              Anticipated d/c date is: 2 days              Patient currently is not medically stable to d/c.   Time Spent in minutes   35 minutes  Procedures:  Thoracentesis  Consultants:   Cardiology Pulmonology  Antimicrobials:   Anti-infectives (From admission, onward)   Start     Dose/Rate Route Frequency Ordered Stop   02/14/20 1400  vancomycin (VANCOREADY) IVPB 750 mg/150 mL     750 mg 150 mL/hr over 60 Minutes Intravenous Every 24 hours 02/13/20 1304     02/13/20 2200  cefTRIAXone (ROCEPHIN) 2 g in sodium chloride 0.9 % 100 mL IVPB     2 g 200 mL/hr over 30 Minutes Intravenous Daily at bedtime 02/13/20 1250     02/13/20 1400  metroNIDAZOLE (FLAGYL) IVPB 500 mg     500 mg 100 mL/hr over 60 Minutes Intravenous Every 8 hours 02/13/20 1249     02/13/20 1400  vancomycin (VANCOREADY) IVPB 1250 mg/250 mL     1,250 mg 166.7 mL/hr over 90 Minutes Intravenous  Once 02/13/20 1304 02/13/20  1709   02/10/20 0030  cefTRIAXone (ROCEPHIN) 1 g in sodium chloride 0.9 % 100 mL IVPB  Status:  Discontinued     1 g 200 mL/hr over 30 Minutes Intravenous Daily at bedtime 02/10/20 0003 02/13/20 1250   02/09/20 1730  vancomycin (VANCOCIN) IVPB 1000 mg/200 mL premix  Status:  Discontinued     1,000 mg 200 mL/hr over 60 Minutes Intravenous  Once 02/09/20 1723 02/09/20 1724   02/09/20 1730  piperacillin-tazobactam (ZOSYN) IVPB 3.375 g     3.375 g 100 mL/hr over 30 Minutes Intravenous  Once 02/09/20 1723 02/09/20 1818   02/09/20 1730  vancomycin (VANCOREADY) IVPB 1250 mg/250 mL     1,250 mg 166.7 mL/hr over  90 Minutes Intravenous  Once 02/09/20 1724 02/09/20 2001         Medications  Scheduled Meds: . amiodarone  200 mg Oral Daily  . amLODipine  5 mg Oral Daily  . brimonidine  1 drop Right Eye BID   And  . timolol  1 drop Right Eye BID  . Chlorhexidine Gluconate Cloth  6 each Topical Daily  . cholecalciferol  2,000 Units Oral Daily  . clopidogrel  75 mg Oral Daily  . cycloSPORINE  1 drop Both Eyes BID  . escitalopram  10 mg Oral Daily  . famotidine  20 mg Oral Daily  . feeding supplement  1 Container Oral BID BM  . feeding supplement (ENSURE ENLIVE)  237 mL Oral BID BM  . letrozole  2.5 mg Oral Daily  . lisinopril  20 mg Oral Daily  . mouth rinse  15 mL Mouth Rinse BID  . multivitamin with minerals  1 tablet Oral Daily  . osimertinib mesylate  80 mg Oral Daily  . pantoprazole  40 mg Oral QAC breakfast  . psyllium  1 packet Oral Daily  . Rivaroxaban  15 mg Oral BID WC   Followed by  . [START ON 03/03/2020] rivaroxaban  20 mg Oral Q supper   Continuous Infusions: . sodium chloride Stopped (02/11/20 0020)  . cefTRIAXone (ROCEPHIN)  IV Stopped (02/13/20 2151)  . metronidazole Stopped (02/14/20 0604)  . vancomycin     PRN Meds:.ALPRAZolam, ondansetron **OR** ondansetron (ZOFRAN) IV, oxyCODONE-acetaminophen      Subjective:   Cadience Bradfield was seen and examined  today.  Very frail and weak, still has shortness of breath, on 4 L O2 via nasal cannula at the time of my examination.  No acute chest pain.  No fevers this morning.  Patient denies dizziness, abdominal pain, N/V/D/C, new weakness, numbess, tingling.    Objective:   Vitals:   02/14/20 0700 02/14/20 0800 02/14/20 0900 02/14/20 1000  BP: (!) 142/58 (!) 148/72 140/61 (!) 141/58  Pulse: 78 81 79 84  Resp: (!) 31 (!) 32 (!) 35 (!) 36  Temp:  98.4 F (36.9 C)    TempSrc:  Oral    SpO2: 94% 93% (!) 89% 90%  Weight:      Height:        Intake/Output Summary (Last 24 hours) at 02/14/2020 1217 Last data filed at 02/14/2020 0800 Gross per 24 hour  Intake 1069.17 ml  Output 1255 ml  Net -185.83 ml     Wt Readings from Last 3 Encounters:  02/14/20 61.9 kg  02/07/20 58.1 kg  12/27/19 60.6 kg   Physical Exam  General: Alert and oriented x 3, uncomfortable, frail and ill-appearing  Cardiovascular: S1 S2 clear, RRR. No pedal edema b/l  Respiratory: Diminished breath sound at the bases  Gastrointestinal: Soft, nontender, nondistended, NBS  Ext: no pedal edema bilaterally  Neuro: no new deficits  Musculoskeletal: No cyanosis, clubbing  Skin: No rashes  Psych: Normal affect and demeanor, alert and oriented x3   Data Reviewed:  I have personally reviewed following labs and imaging studies  Micro Results Recent Results (from the past 240 hour(s))  Blood Culture (routine x 2)     Status: None   Collection Time: 02/09/20  5:33 PM   Specimen: BLOOD RIGHT FOREARM  Result Value Ref Range Status   Specimen Description   Final    BLOOD RIGHT FOREARM Performed at [facility] [address] [place], [place] [address]  Special Requests   Final    BOTTLES DRAWN AEROBIC AND ANAEROBIC Blood Culture results may not be optimal due to an inadequate volume of blood received in culture bottles Performed at [facility] [address],  [place], [place] [address]    Culture   Final    NO GROWTH 5 DAYS Performed at [facility], [place] [address], [place], [place] [address]    Report Status 02/14/2020 FINAL  Final  Blood Culture (routine x 2)     Status: None   Collection Time: 02/09/20  5:33 PM   Specimen: BLOOD  Result Value Ref Range Status   Specimen Description   Final    BLOOD RIGHT ANTECUBITAL Performed at [facility] [address] [place], [place] [address]    Special Requests   Final    BOTTLES DRAWN AEROBIC AND ANAEROBIC Blood Culture results may not be optimal due to an inadequate volume of blood received in culture bottles Performed at [facility] [address] [place], [place] [address]    Culture   Final    NO GROWTH 5 DAYS Performed at [facility], [place] [address], [place], [place] [address]    Report Status 02/14/2020 FINAL  Final  Urine culture     Status: Abnormal   Collection Time: 02/09/20  8:36 PM   Specimen: In/Out Cath Urine  Result Value Ref Range Status   Specimen Description   Final    IN/OUT CATH URINE Performed at [facility] [address] [place], [place] [address]    Special Requests   Final    NONE Performed at [facility], [place] [address], [place], [place] [address]    Culture 5,000 COLONIES/mL ESCHERICHIA COLI (A)  Final   Report Status 02/11/2020 FINAL  Final   Organism ID, Bacteria ESCHERICHIA COLI (A)  Final      Susceptibility   Escherichia coli - MIC*    AMPICILLIN 8 SENSITIVE Sensitive     CEFAZOLIN <=4 SENSITIVE Sensitive     CEFTRIAXONE <=1 SENSITIVE Sensitive     CIPROFLOXACIN >=4 RESISTANT Resistant     GENTAMICIN >=16 RESISTANT Resistant     IMIPENEM <=0.25 SENSITIVE Sensitive     NITROFURANTOIN <=16 SENSITIVE Sensitive     TRIMETH/SULFA <=20 SENSITIVE Sensitive     AMPICILLIN/SULBACTAM 4 SENSITIVE Sensitive     PIP/TAZO <=4 SENSITIVE Sensitive     * 5,000  COLONIES/mL ESCHERICHIA COLI  SARS CORONAVIRUS 2 (TAT 6-24 HRS) Nasopharyngeal Nasopharyngeal Swab     Status: None   Collection Time: 02/10/20  3:12 AM   Specimen: Nasopharyngeal Swab  Result Value Ref Range Status   SARS Coronavirus 2 NEGATIVE NEGATIVE Final    Comment: (NOTE) SARS-CoV-2 target nucleic acids are NOT DETECTED. The SARS-CoV-2 RNA is generally detectable in upper and lower respiratory specimens during the acute phase of infection. Negative results do not preclude SARS-CoV-2 infection, do not rule out co-infections with other pathogens, and should not be used as the sole basis for treatment or other patient management decisions. Negative results must be combined with clinical observations, patient history, and epidemiological information. The expected result is Negative. Fact Sheet for Patients: SugarRoll.be Fact Sheet for Healthcare Providers: https://www.woods-mathews.com/ This test is not yet approved or cleared by the [place] FDA and  has been authorized for detection and/or diagnosis of SARS-CoV-2 by FDA under an Emergency Use Authorization (EUA). This EUA will remain  in effect (meaning this test can be used) for the duration of the COVID-19 declaration under Section 56 4(b)(1) of the Act, 21 U.S.C. section 360bbb-3(b)(1), unless the authorization is terminated or revoked sooner. Performed at [facility], [place] [address], [place], [place] [address]   MRSA PCR Screening     Status: None   Collection Time: 02/10/20  3:32 PM   Specimen: Nasal Mucosa; Nasopharyngeal  Result Value Ref Range Status   MRSA by PCR NEGATIVE NEGATIVE Final    Comment:        The GeneXpert MRSA Assay (FDA approved for NASAL specimens only), is one component of a comprehensive MRSA colonization surveillance program. It is not intended to diagnose MRSA infection nor to guide or monitor treatment for MRSA infections. Performed at  [facility], [place] [address] [place], [place] [address]   Body fluid culture (includes gram stain)     Status: None   Collection Time: 02/10/20  6:23 PM   Specimen: Pleural Fluid  Result Value Ref Range Status   Specimen Description   Final    PLEURAL LEFT Performed at [facility] [address] [place], [place] [address]    Special Requests   Final    Normal Performed at [facility], [place] [address], [place], [place] [address]    Gram Stain   Final    FEW WBC PRESENT, PREDOMINANTLY MONONUCLEAR NO ORGANISMS SEEN    Culture   Final    NO GROWTH 3 DAYS Performed at [facility] [address] [place], [place] [address]    Report Status 02/14/2020 FINAL  Final  Culture, blood (Routine X 2) w Reflex to ID Panel     Status: None (Preliminary result)   Collection Time: 02/13/20  1:16 PM   Specimen: Left Antecubital; Blood  Result Value Ref Range Status   Specimen Description   Final    LEFT ANTECUBITAL Performed at [facility] [address], [place], [place] [address]    Special Requests   Final    BOTTLES DRAWN AEROBIC ONLY Blood Culture adequate volume Performed at [facility] [address], [place], [place] [address]    Culture   Final    NO GROWTH < 24 HOURS Performed at [facility] [address], [place], [place] [address]    Report Status PENDING  Incomplete  Culture, blood (Routine X 2) w Reflex to ID Panel     Status: None (Preliminary result)   Collection Time: 02/13/20  1:19 PM   Specimen: BLOOD LEFT HAND  Result Value Ref Range Status   Specimen Description   Final    BLOOD LEFT HAND Performed at [facility] [address], [place], [place] [address]    Special Requests   Final    BOTTLES DRAWN AEROBIC ONLY Blood Culture adequate volume Performed at [facility] [address] [place], [place]  [address]    Culture   Final    NO GROWTH < 24 HOURS Performed at [facility] [address], [place], [place] [address]    Report Status PENDING  Incomplete    Radiology Reports CT ANGIO CHEST PE W OR WO CONTRAST  Result Date: 02/09/2020 CLINICAL DATA:  Shortness of breath, weakness, left upper lobe lung cancer EXAM: CT ANGIOGRAPHY CHEST WITH CONTRAST TECHNIQUE: Multidetector CT imaging of the chest was performed using the standard protocol  during bolus administration of intravenous contrast. Multiplanar CT image reconstructions and MIPs were obtained to evaluate the vascular anatomy. CONTRAST:  9m OMNIPAQUE IOHEXOL 350 MG/ML SOLN COMPARISON:  12/24/2019 FINDINGS: Cardiovascular: This is a technically adequate evaluation of the pulmonary vasculature. There are segmental right lower lobe pulmonary emboli. Clot burden is minimal. No evidence of right heart strain. No pericardial effusion. Thoracic aorta is normal in caliber with no aneurysm or dissection. Mediastinum/Nodes: No pathologic adenopathy. There is a large hiatal hernia. Esophagus, trachea, and thyroid are stable. Lungs/Pleura: Since the previous exam, there has been significant enlargement of the left pleural effusion now measuring greater than 1 L in volume. The effusion is partially loculated, with continued areas of pleural thickening. There is dense left lower lobe consolidation consistent with atelectasis. There is a trace right pleural effusion. Patchy hypoventilatory changes are seen at the right lung base. No pneumothorax. Central airways patent. Upper Abdomen: Stable right lobe liver cyst. Remainder of the upper abdomen is unremarkable. Musculoskeletal: No acute or destructive bony lesions. Reconstructed images demonstrate no additional findings. Review of the MIP images confirms the above findings. IMPRESSION: 1. Right lower lobe segmental pulmonary emboli. Clot burden is minimal. 2. Significant interval enlargement of the  partially loculated left pleural effusion, now measuring greater than 1 L in volume. Continued areas of pleural thickening suspicious for recurrent disease. 3. Trace right pleural effusion. 4. Hiatal hernia. These results were called by telephone at the time of interpretation on 02/09/2020 at 11:40 pm [facility] verbally acknowledged these results. Electronically Signed   By: MRanda NgoM.D.   On: 02/09/2020 23:40   DG CHEST PORT 1 VIEW  Result Date: 02/12/2020 CLINICAL DATA:  Shortness of breath EXAM: PORTABLE CHEST 1 VIEW COMPARISON:  02/11/2020 and 02/10/2020 FINDINGS: Cardiomediastinal contours remain enlarged. Bilateral effusions similar to prior study with persistent LEFT lower lobe airspace disease and obscured LEFT hemidiaphragm. Increased interstitial markings persist. No acute bone process. IMPRESSION: No interval change in appearance of the chest since prior study. Bilateral effusions with LEFT lower lobe airspace disease. Persistent increased interstitial markings cardiomegaly. Electronically Signed   By: GZetta BillsM.D.   On: 02/12/2020 18:18   DG Chest Port 1 View  Result Date: 02/11/2020 CLINICAL DATA:  Respiratory failure EXAM: PORTABLE CHEST 1 VIEW COMPARISON:  02/10/2020 FINDINGS: Cardiomegaly with vascular congestion. Small left pleural effusion. Left lower lobe atelectasis. Bilateral interstitial prominence may reflect mild interstitial edema. IMPRESSION: Cardiomegaly, suspect mild interstitial edema. Small left effusion with left base atelectasis. Electronically Signed   By: KRolm BaptiseM.D.   On: 02/11/2020 02:22   DG CHEST PORT 1 VIEW  Result Date: 02/10/2020 CLINICAL DATA:  Post left thoracentesis EXAM: PORTABLE CHEST 1 VIEW COMPARISON:  02/09/2020 FINDINGS: Interval decrease in the left effusion following thoracentesis. Small left effusion remains. No pneumothorax. Left base atelectasis. Mild cardiomegaly. Right lung clear. IMPRESSION: Decreasing  left effusion following thoracentesis.  No pneumothorax. Small left effusion and left base atelectasis. Electronically Signed   By: KRolm BaptiseM.D.   On: 02/10/2020 19:13   DG Chest Port 1 View  Result Date: 02/09/2020 CLINICAL DATA:  Short of breath EXAM: PORTABLE CHEST 1 VIEW COMPARISON:  06/24/2018, CT 12/24/2019 FINDINGS: Postoperative changes in the left upper lung. Moderate left pleural effusion increased compared to prior. Probable trace right effusion. Airspace disease at the left base. Obscured cardiomediastinal silhouette. Hiatal hernia. Convex opacity over the lower mediastinal silhouette. IMPRESSION: 1. Postsurgical changes of the left  chest. Moderate left pleural effusion, increased compared to prior. Probable trace right pleural effusion. Airspace disease at the left lung base. Electronically Signed   By: Donavan Foil M.D.   On: 02/09/2020 18:07   ECHOCARDIOGRAM COMPLETE  Result Date: 02/10/2020    ECHOCARDIOGRAM REPORT   Patient Name:   IMOGENE GRAVELLE Date of Exam: 02/10/2020 Medical Rec #:  [id number]       Height:       66.0 in Accession #:    [id number]      Weight:       127.9 lb Date of Birth:  Jun 04, 1941       BSA:          1.654 m Patient Age:    45 years        BP:           111/58 mmHg Patient Gender: F               HR:           88 bpm. Exam Location:  Inpatient Procedure: 2D Echo and Intracardiac Opacification Agent Indications:    Pulmonary Embolus 415.19 / I26.99  History:        Patient has prior history of Echocardiogram examinations, most                 recent 02/01/2016. Risk Factors:Hypertension. CKD                 Pleural effusion.  Sonographer:    Vikki Ports Turrentine Referring Phys: [id number] Rise Patience  Sonographer Comments: Technically difficult study due to poor echo windows. Image acquisition challenging due to patient body habitus. IMPRESSIONS  1. Left ventricular ejection fraction, by estimation, is 50 to 55%. The left ventricle has low normal function. The  left ventricle has no regional wall motion abnormalities. Left ventricular diastolic parameters are consistent with Grade I diastolic dysfunction (impaired relaxation).  2. Right ventricular systolic function is moderately reduced. The right ventricular size is moderately enlarged. D-shaped septum suggestive of RV pressure/volume overload. No complete TR doppler jet so unable to estimate PA systolic pressure.  3. The aortic valve is tricuspid. Aortic valve regurgitation is trivial. No aortic stenosis is present.  4. The mitral valve is normal in structure. No evidence of mitral valve regurgitation. No evidence of mitral stenosis.  5. The inferior vena cava is dilated in size with <50% respiratory variability, suggesting right atrial pressure of 15 mmHg.  6. Left-sided pleural effusion noted. Trivial pericardial effusion. FINDINGS  Left Ventricle: Left ventricular ejection fraction, by estimation, is 50 to 55%. The left ventricle has low normal function. The left ventricle has no regional wall motion abnormalities. Definity contrast agent was given IV to delineate the left ventricular endocardial borders. The left ventricular internal cavity size was normal in size. There is no left ventricular hypertrophy. Left ventricular diastolic parameters are consistent with Grade I diastolic dysfunction (impaired relaxation). Right Ventricle: The right ventricular size is moderately enlarged. No increase in right ventricular wall thickness. Right ventricular systolic function is moderately reduced. Tricuspid regurgitation signal is inadequate for assessing PA pressure. Left Atrium: Left atrial size was normal in size. Right Atrium: Right atrial size was normal in size. Pericardium: Trivial pericardial effusion is present. Mitral Valve: The mitral valve is normal in structure. No evidence of mitral valve regurgitation. No evidence of mitral valve stenosis. Tricuspid Valve: The tricuspid valve is normal in structure. Tricuspid  valve regurgitation is not demonstrated. Aortic Valve:  The aortic valve is tricuspid. Aortic valve regurgitation is trivial. No aortic stenosis is present. Pulmonic Valve: The pulmonic valve was normal in structure. Pulmonic valve regurgitation is not visualized. Aorta: The aortic root is normal in size and structure. Venous: The inferior vena cava is dilated in size with less than 50% respiratory variability, suggesting right atrial pressure of 15 mmHg. IAS/Shunts: No atrial level shunt detected by color flow Doppler.  LEFT VENTRICLE PLAX 2D LVOT diam:     1.80 cm  Diastology LV SV:         36       LV e' lateral:   9.36 cm/s LV SV Index:   22       LV E/e' lateral: 3.6 LVOT Area:     2.54 cm LV e' medial:    7.83 cm/s                         LV E/e' medial:  4.3  LEFT ATRIUM           Index LA Vol (A4C): 36.2 ml 21.89 ml/m  AORTIC VALVE LVOT Vmax:   84.90 cm/s LVOT Vmean:  61.100 cm/s LVOT VTI:    0.141 m MITRAL VALVE MV Area (PHT): 3.37 cm    SHUNTS MV Decel Time: 225 msec    Systemic VTI:  0.14 m MV E velocity: 33.40 cm/s  Systemic Diam: 1.80 cm MV A velocity: 59.70 cm/s MV E/A ratio:  0.56 Loralie Champagne MD Electronically signed by Loralie Champagne MD Signature Date/Time: 02/10/2020/4:49:30 PM    Final    CT RENAL STONE STUDY  Result Date: 02/09/2020 CLINICAL DATA:  Flank pain, kidney stone suspected. EXAM: CT ABDOMEN AND PELVIS WITHOUT CONTRAST TECHNIQUE: Multidetector CT imaging of the abdomen and pelvis was performed following the standard protocol without IV contrast. COMPARISON:  Abdominal CT 12/24/2019. Concurrent chest CTA, reported separately. FINDINGS: Lower chest: Left pleural effusion and basilar consolidation assessed on concurrent chest CT, reported separately. Moderately large hiatal hernia, partially obscured by motion artifact. Hepatobiliary: Cyst in the right lobe of the liver is grossly stable, motion artifact partially obscures evaluation. There is some high-density material within the  gallbladder that may represent sludge. More detailed evaluation is limited given motion. No biliary dilatation. Pancreas: Motion artifact through the pancreas limits assessment. No ductal dilatation. No obvious peripancreatic inflammation Spleen: Normal in size with small cleft medially. Adrenals/Urinary Tract: No adrenal nodule. No hydronephrosis. No renal or ureteral calculi. Mild bilateral renal parenchymal thinning and cortical scarring in the lower right kidney. There is symmetric bilateral perinephric edema. Urinary bladder is unremarkable, no bladder stone. Stomach/Bowel: Moderately large hiatal hernia. Motion limits more detailed assessment. No evidence of bowel obstruction. No obvious bowel inflammation, lack of contrast and motion limits detailed bowel assessment. Descending colostomy. Mild descending colonic diverticulosis without diverticulitis chain sutures noted in the cecum. Vascular/Lymphatic: Aorto bi-iliac atherosclerosis. No grossly enlarged abdominopelvic lymph nodes, detailed assessment limited given motion and lack of contrast. Reproductive: Calcified uterine fibroids. No adnexal mass. Other: Mild generalized body wall edema. No ascites. No free air. Musculoskeletal: Left hip arthroplasty. Bones are under mineralized. Stable bone island in the mid sacrum. Multilevel degenerative change in the lumbar spine. IMPRESSION: 1. Motion limited exam. 2. No renal stones or obstructive uropathy. Symmetric bilateral perinephric edema is nonspecific. 3. Moderately large hiatal hernia. 4. High-density material within the gallbladder may represent sludge. More detailed evaluation is limited given motion artifact. Aortic Atherosclerosis (ICD10-I70.0). Electronically  Signed   By: Keith Rake M.D.   On: 02/09/2020 23:30   VAS [place] LOWER EXTREMITY VENOUS (DVT)  Result Date: 02/11/2020  Lower Venous DVTStudy Indications: Pulmonary embolism.  Performing Technologist: June Leap RDMS, RVT  Examination  Guidelines: A complete evaluation includes B-mode imaging, spectral Doppler, color Doppler, and power Doppler as needed of all accessible portions of each vessel. Bilateral testing is considered an integral part of a complete examination. Limited examinations for reoccurring indications may be performed as noted. The reflux portion of the exam is performed with the patient in reverse Trendelenburg.  +---------+---------------+---------+-----------+----------+--------------+ RIGHT    CompressibilityPhasicitySpontaneityPropertiesThrombus Aging +---------+---------------+---------+-----------+----------+--------------+ CFV      Full           Yes      Yes                                 +---------+---------------+---------+-----------+----------+--------------+ SFJ      Full                                                        +---------+---------------+---------+-----------+----------+--------------+ FV Prox  Full                                                        +---------+---------------+---------+-----------+----------+--------------+ FV Mid   Full                                                        +---------+---------------+---------+-----------+----------+--------------+ FV DistalFull                                                        +---------+---------------+---------+-----------+----------+--------------+ PFV      Full                                                        +---------+---------------+---------+-----------+----------+--------------+ POP      Full           Yes      Yes                                 +---------+---------------+---------+-----------+----------+--------------+ PTV      Full                                                        +---------+---------------+---------+-----------+----------+--------------+ PERO     Full                                                         +---------+---------------+---------+-----------+----------+--------------+   +---------+---------------+---------+-----------+----------+--------------+  LEFT     CompressibilityPhasicitySpontaneityPropertiesThrombus Aging +---------+---------------+---------+-----------+----------+--------------+ CFV      Full           Yes      Yes                                 +---------+---------------+---------+-----------+----------+--------------+ SFJ      Full                                                        +---------+---------------+---------+-----------+----------+--------------+ FV Prox  Full                                                        +---------+---------------+---------+-----------+----------+--------------+ FV Mid   Full                                                        +---------+---------------+---------+-----------+----------+--------------+ FV DistalFull                                                        +---------+---------------+---------+-----------+----------+--------------+ PFV      Full                                                        +---------+---------------+---------+-----------+----------+--------------+ POP      Full           Yes      Yes                                 +---------+---------------+---------+-----------+----------+--------------+ PTV      None                                                        +---------+---------------+---------+-----------+----------+--------------+ PERO     None                                                        +---------+---------------+---------+-----------+----------+--------------+     Summary: RIGHT: - There is no evidence of deep vein thrombosis in the lower extremity.  - No cystic structure found in the popliteal fossa.  LEFT: - Findings consistent with acute deep vein thrombosis involving the left posterior tibial veins, and left peroneal  veins. -  No cystic structure found in the popliteal fossa.  *See table(s) above for measurements and observations. Electronically signed by Harold Barban MD on 02/11/2020 at 5:06:34 PM.    Final     Lab Data:  CBC: Recent Labs  Lab 02/09/20 1723 02/09/20 1723 02/10/20 2841 02/10/20 3244 02/11/20 0102 02/11/20 7253 02/12/20 0259 02/13/20 0251 02/14/20 0245  WBC 12.1*   < > 16.7*  --   --  12.1* 14.4* 15.9* 16.8*  NEUTROABS 9.6*  --   --   --   --   --   --   --   --   HGB 10.6*   < > 8.5*   < > 9.8* 8.3* 9.3* 9.8* 9.7*  HCT 33.5*   < > 27.2*   < > 31.1* 26.0* 28.3* 30.1* 29.7*  MCV 91.0   < > 91.9  --   --  91.5 87.9 88.3 88.7  PLT 165   < > 123*  --   --  160 182 170 185   < > = values in this interval not displayed.   Basic Metabolic Panel: Recent Labs  Lab 02/11/20 0043 02/11/20 0511 02/12/20 0259 02/13/20 0251 02/14/20 0245  NA 135 135  136 134* 136 141  K 4.8 4.6  4.6 4.6 3.7 3.9  CL 105 104  106 103 105 107  CO2 18* 20*  21* 21* 22 22  GLUCOSE 158* 143*  144* 122* 123* 116*  BUN 30* 33*  35* 36* 32* 38*  CREATININE 1.03* 0.98  0.99 1.01* 0.93 0.98  CALCIUM 9.3 9.5  9.6 9.4 8.9 8.8*  MG 2.1  --   --   --   --    GFR: Estimated Creatinine Clearance: 42.9 mL/min (by C-G formula based on SCr of 0.98 mg/dL). Liver Function Tests: Recent Labs  Lab 02/09/20 1723 02/11/20 0511  AST 55* 69*  67*  ALT 58* 71*  69*  ALKPHOS 186* 138*  133*  BILITOT 0.7 0.6  0.5  PROT 6.6 6.0*  5.9*  ALBUMIN 2.9* 3.2*  3.1*   No results for input(s): LIPASE, AMYLASE in the last 168 hours. No results for input(s): AMMONIA in the last 168 hours. Coagulation Profile: Recent Labs  Lab 02/09/20 1723  INR 1.3*   Cardiac Enzymes: No results for input(s): CKTOTAL, CKMB, CKMBINDEX, TROPONINI in the last 168 hours. BNP (last 3 results) No results for input(s): PROBNP in the last 8760 hours. HbA1C: No results for input(s): HGBA1C in the last 72 hours. CBG: No results for  input(s): GLUCAP in the last 168 hours. Lipid Profile: No results for input(s): CHOL, HDL, LDLCALC, TRIG, CHOLHDL, LDLDIRECT in the last 72 hours. Thyroid Function Tests: Recent Labs    02/11/20 1859  T3FREE 2.4   Anemia Panel: No results for input(s): VITAMINB12, FOLATE, FERRITIN, TIBC, IRON, RETICCTPCT in the last 72 hours. Urine analysis:    Component Value Date/Time   COLORURINE YELLOW 02/09/2020 2036   APPEARANCEUR HAZY (A) 02/09/2020 2036   LABSPEC 1.031 (H) 02/09/2020 2036   PHURINE 5.0 02/09/2020 2036   GLUCOSEU NEGATIVE 02/09/2020 2036   HGBUR NEGATIVE 02/09/2020 2036   BILIRUBINUR NEGATIVE 02/09/2020 2036   KETONESUR NEGATIVE 02/09/2020 2036   PROTEINUR 100 (A) 02/09/2020 2036   UROBILINOGEN 0.2 04/29/2013 2243   NITRITE NEGATIVE 02/09/2020 2036   LEUKOCYTESUR LARGE (A) 02/09/2020 2036     Malikah Principato M.D. [facility] 02/14/2020, 12:17 PM   Call night coverage person covering  after 7pm

## 2020-02-14 NOTE — Progress Notes (Deleted)
Brief oncology note:  The patient is resting quietly at the time of my visit.  She sat up for about an hour and was exhausted after doing this.  Her shortness of breath has improved.  Has now been switched from heparin to Xarelto.  She has restarted her Tagrisso 80 mg daily and also remains on letrozole.  I again discussed with her daughter that we can obtain an MRI of the brain with and without contrast when she is more medically stable.  This was ordered previously as an outpatient for restaging purposes.  She currently has a PET scan scheduled for this Wednesday, but we will likely need to reschedule this as she remains in the ICU.  We will continue to check in with the patient and her family periodically.   Mikey Bussing, DNP, AGPCNP-BC, AOCNP Mon/Tues/Thurs/Fri 7am-5pm; Off Wednesdays Cell: [phone]

## 2020-02-15 ENCOUNTER — Encounter: Payer: Self-pay | Admitting: *Deleted

## 2020-02-15 ENCOUNTER — Inpatient Hospital Stay (HOSPITAL_COMMUNITY): Payer: Medicare Other

## 2020-02-15 DIAGNOSIS — N390 Urinary tract infection, site not specified: Secondary | ICD-10-CM | POA: Diagnosis not present

## 2020-02-15 DIAGNOSIS — C3490 Malignant neoplasm of unspecified part of unspecified bronchus or lung: Secondary | ICD-10-CM | POA: Diagnosis not present

## 2020-02-15 DIAGNOSIS — J9601 Acute respiratory failure with hypoxia: Secondary | ICD-10-CM | POA: Diagnosis not present

## 2020-02-15 DIAGNOSIS — I2699 Other pulmonary embolism without acute cor pulmonale: Secondary | ICD-10-CM | POA: Diagnosis not present

## 2020-02-15 LAB — BASIC METABOLIC PANEL
Anion gap: 8 (ref 5–15)
BUN: 37 mg/dL — ABNORMAL HIGH (ref 8–23)
CO2: 25 mmol/L (ref 22–32)
Calcium: 8.6 mg/dL — ABNORMAL LOW (ref 8.9–10.3)
Chloride: 105 mmol/L (ref 98–111)
Creatinine, Ser: 1.05 mg/dL — ABNORMAL HIGH (ref 0.44–1.00)
GFR calc Af Amer: 58 mL/min — ABNORMAL LOW (ref >60)
GFR calc non Af Amer: 50 mL/min — ABNORMAL LOW (ref >60)
Glucose, Bld: 109 mg/dL — ABNORMAL HIGH (ref 70–99)
Potassium: 3.4 mmol/L — ABNORMAL LOW (ref 3.5–5.1)
Sodium: 138 mmol/L (ref 135–145)

## 2020-02-15 LAB — CBC
HCT: 27.7 % — ABNORMAL LOW (ref 36.0–46.0)
Hemoglobin: 8.9 g/dL — ABNORMAL LOW (ref 12.0–15.0)
MCH: 28.4 pg (ref 26.0–34.0)
MCHC: 32.1 g/dL (ref 30.0–36.0)
MCV: 88.5 fL (ref 80.0–100.0)
Platelets: 205 10*3/uL (ref 150–400)
RBC: 3.13 MIL/uL — ABNORMAL LOW (ref 3.87–5.11)
RDW: 13.9 % (ref 11.5–15.5)
WBC: 15.8 10*3/uL — ABNORMAL HIGH (ref 4.0–10.5)
nRBC: 0.1 % (ref 0.0–0.2)

## 2020-02-15 LAB — CYTOLOGY - NON PAP

## 2020-02-15 MED ORDER — GADOBUTROL 1 MMOL/ML IV SOLN
4.0000 mL | Freq: Once | INTRAVENOUS | Status: AC | PRN
  Administered 2020-02-15: 4 mL via INTRAVENOUS

## 2020-02-15 MED ORDER — MAGIC MOUTHWASH W/LIDOCAINE
5.0000 mL | Freq: Four times a day (QID) | ORAL | Status: DC | PRN
  Administered 2020-02-15: 5 mL via ORAL
  Filled 2020-02-15 (×2): qty 5

## 2020-02-15 MED ORDER — POTASSIUM CHLORIDE CRYS ER 20 MEQ PO TBCR
40.0000 meq | EXTENDED_RELEASE_TABLET | Freq: Once | ORAL | Status: AC
  Administered 2020-02-15: 40 meq via ORAL
  Filled 2020-02-15: qty 2

## 2020-02-15 MED ORDER — FUROSEMIDE 40 MG PO TABS
40.0000 mg | ORAL_TABLET | Freq: Two times a day (BID) | ORAL | Status: DC
  Administered 2020-02-15 – 2020-02-19 (×8): 40 mg via ORAL
  Filled 2020-02-15 (×8): qty 1

## 2020-02-15 NOTE — Progress Notes (Signed)
NAME:  Kristin [name], MRN:  [id number], DOB:  02-Apr-1941, LOS: 5 ADMISSION DATE:  02/09/2020, CONSULTATION DATE:  02/10/2020 REFERRING MD:  Dr. Reesa Chew, CHIEF COMPLAINT:  Fever and shortness of breath   Brief History   79yo female with extensive PMH who presented with complaint of shortness of breath, weakness, weight loss, and fever. Found to have large pleural effusion and PE.    Past Medical History  Anemia of chronic disease Breast cancer Colon cancer NSCL cancer  CVA Hypertension  Significant Hospital Events   Admitted 4/22  Consults:  Pulmonary oncology  Procedures:  Left thora 4/22 > 961ml blood pleural fluid removed  > malignant cells present  Significant Diagnostic Tests:  CTA chest 4/22 >  evidence of RV strain as well as significant enlargement large left pleural effusion.   ECHO 4/22 >LVEF WNL 4/22 pleural fluid> malignant cells present  Micro Data:  Urine culture 4/22 > 4,000 positive gram negative rods Blood culture 4/22 > COVID 4/22 > negative  Antimicrobials:  Ceftriaxone 4/22  >> 4/25 Flagyl 4/25 >> Zosyn 4/25 >>   Interim history/subjective:  Breathing OK  Objective   Blood pressure (!) 119/47, pulse 71, temperature 98.2 F (36.8 C), temperature source Oral, resp. rate (!) 27, height 5\' 6"  (1.676 m), weight 60 kg, SpO2 95 %.        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 - per primary team  A fib with RVR Acute on chronic diastolic CHF - tele - medications per cardiology  PCCM available prn  Best practice:  Diet: Heart healthy  DVT prophylaxis: xarelto GI prophylaxis: protonix Mobility: Bedrest  Code Status: Full Disposition: ICU   Labs    CMP Latest Ref Rng & Units 02/15/2020 02/14/2020 02/13/2020  Glucose 70 - 99 mg/dL 109(H) 116(H) 123(H)  BUN 8 - 23 mg/dL 37(H) 38(H) 32(H)  Creatinine 0.44 - 1.00 mg/dL 1.05(H) 0.98 0.93  Sodium 135 - 145 mmol/L 138 141 136  Potassium 3.5 - 5.1 mmol/L 3.4(L) 3.9 3.7  Chloride 98 - 111 mmol/L 105 107 105  CO2 22 - 32 mmol/L 25 22 22   Calcium 8.9 - 10.3 mg/dL 8.6(L) 8.8(L) 8.9  Total Protein 6.5 - 8.1 g/dL - - -  Total Bilirubin 0.3 - 1.2 mg/dL - - -  Alkaline Phos 38 - 126 U/L - - -  AST 15 - 41 U/L - - -  ALT 0 - 44 U/L - - -    CBC Latest Ref Rng & Units 02/15/2020 02/14/2020 02/13/2020  WBC 4.0 - 10.5 K/uL 15.8(H) 16.8(H) 15.9(H)  Hemoglobin 12.0 - 15.0 g/dL 8.9(L) 9.7(L) 9.8(L)  Hematocrit 36.0 - 46.0 % 27.7(L) 29.7(L) 30.1(L)  Platelets 150 - 400 K/uL 205 185 170    Signature:  Roselie Awkward, MD [place] Pager: [phone] Cell: [phone] If no response, call [phone]  02/15/2020, 11:15 AM

## 2020-02-15 NOTE — Progress Notes (Signed)
HEMATOLOGY-ONCOLOGY PROGRESS NOTE  SUBJECTIVE: Husband is at the bedside this morning.  Reports some improvement in her breathing.  Had a fever up to 100.8 yesterday afternoon.  Remains on IV antibiotics.  Cytology results from thoracentesis are still pending this morning.  She has no other complaints this morning.  REVIEW OF SYSTEMS:   Constitutional: Reports ongoing generalized weakness, still with fevers intermittently Respiratory: Still has some shortness of breath which has improved somewhat Cardiovascular: Denies palpitation, chest discomfort Gastrointestinal:  Denies nausea, heartburn or change in bowel habits Skin: Denies abnormal skin rashes Lymphatics: Denies new lymphadenopathy or easy bruising Neurological:Denies numbness, tingling or new weaknesses Behavioral/Psych: Mood is stable, no new changes  Extremities: No lower extremity edema All other systems were reviewed with the patient and are negative.  I have reviewed the past medical history, past surgical history, social history and family history with the patient and they are unchanged from previous note.   PHYSICAL EXAMINATION: ECOG PERFORMANCE STATUS: 2 - Symptomatic, <50% confined to bed  Vitals:   02/15/20 0700 02/15/20 0800  BP: (!) 128/54 (!) 119/47  Pulse: 72 71  Resp: (!) 27 (!) 27  Temp:  98.2 F (36.8 C)  SpO2: 95% 95%   Filed Weights   02/13/20 0500 02/14/20 0500 02/15/20 0457  Weight: 61.9 kg 61.9 kg 60 kg    Intake/Output from previous day: 04/26 0701 - 04/27 0700 In: 357.8 [P.O.:30; IV Piggyback:327.8] Out: 1425 [Urine:1425]  GENERAL: Chronically ill-appearing female LUNGS: Diminished breath sounds at bases, left greater than right HEART: Tachycardic, no lower extremity edema ABDOMEN:abdomen soft, non-tender and normal bowel sounds Musculoskeletal:no cyanosis of digits and no clubbing  NEURO: alert & oriented x 3 with fluent speech, no focal motor/sensory deficits  LABORATORY DATA:  I have  reviewed the data as listed CMP Latest Ref Rng & Units 02/15/2020 02/14/2020 02/13/2020  Glucose 70 - 99 mg/dL 109(H) 116(H) 123(H)  BUN 8 - 23 mg/dL 37(H) 38(H) 32(H)  Creatinine 0.44 - 1.00 mg/dL 1.05(H) 0.98 0.93  Sodium 135 - 145 mmol/L 138 141 136  Potassium 3.5 - 5.1 mmol/L 3.4(L) 3.9 3.7  Chloride 98 - 111 mmol/L 105 107 105  CO2 22 - 32 mmol/L _0 Calcium 8.9 - 10.3 mg/dL 8.6(L) 8.8(L) 8.9  Total Protein 6.5 - 8.1 g/dL - - -  Total Bilirubin 0.3 - 1.2 mg/dL - - -  Alkaline Phos 38 - 126 U/L - - -  AST 15 - 41 U/L - - -  ALT 0 - 44 U/L - - -    Lab Results  Component Value Date   WBC 15.8 (H) 02/15/2020   HGB 8.9 (L) 02/15/2020   HCT 27.7 (L) 02/15/2020   MCV 88.5 02/15/2020   PLT 205 02/15/2020   NEUTROABS 9.6 (H) 02/09/2020    CT ANGIO CHEST PE W OR WO CONTRAST  Result Date: 02/09/2020 CLINICAL DATA:  Shortness of breath, weakness, left upper lobe lung cancer EXAM: CT ANGIOGRAPHY CHEST WITH CONTRAST TECHNIQUE: Multidetector CT imaging of the chest was performed using the standard protocol during bolus administration of intravenous contrast. Multiplanar CT image reconstructions and MIPs were obtained to evaluate the vascular anatomy. CONTRAST:  40m OMNIPAQUE IOHEXOL 350 MG/ML SOLN COMPARISON:  12/24/2019 FINDINGS: Cardiovascular: This is a technically adequate evaluation of the pulmonary vasculature. There are segmental right lower lobe pulmonary emboli. Clot burden is minimal. No evidence of right heart strain. No pericardial effusion. Thoracic aorta is normal in caliber with no aneurysm  or dissection. Mediastinum/Nodes: No pathologic adenopathy. There is a large hiatal hernia. Esophagus, trachea, and thyroid are stable. Lungs/Pleura: Since the previous exam, there has been significant enlargement of the left pleural effusion now measuring greater than 1 L in volume. The effusion is partially loculated, with continued areas of pleural thickening. There is dense left lower  lobe consolidation consistent with atelectasis. There is a trace right pleural effusion. Patchy hypoventilatory changes are seen at the right lung base. No pneumothorax. Central airways patent. Upper Abdomen: Stable right lobe liver cyst. Remainder of the upper abdomen is unremarkable. Musculoskeletal: No acute or destructive bony lesions. Reconstructed images demonstrate no additional findings. Review of the MIP images confirms the above findings. IMPRESSION: 1. Right lower lobe segmental pulmonary emboli. Clot burden is minimal. 2. Significant interval enlargement of the partially loculated left pleural effusion, now measuring greater than 1 L in volume. Continued areas of pleural thickening suspicious for recurrent disease. 3. Trace right pleural effusion. 4. Hiatal hernia. These results were called by telephone at the time of interpretation on 02/09/2020 at 11:40 pm to provider [facility] , who verbally acknowledged these results. Electronically Signed   By: Randa Ngo M.D.   On: 02/09/2020 23:40   DG Chest Port 1 View  Result Date: 02/15/2020 CLINICAL DATA:  Pleural effusions. EXAM: PORTABLE CHEST 1 VIEW COMPARISON:  Chest x-ray 02/12/2020, 02/11/2020. FINDINGS: Cardiomegaly. Diffuse bilateral pulmonary infiltrates/edema, progressed from prior exam. CHF could present this fashion. Bilateral pneumonia cannot be excluded. Bilateral pleural effusions again noted. No pneumothorax. Surgical sutures noted over the left upper chest. Surgical clips noted over the right chest. No acute bony abnormality identified. IMPRESSION: Cardiomegaly. Diffuse progressive bilateral pulmonary infiltrates/edema. Bilateral pleural effusions again noted. Electronically Signed   By: Marcello Moores  Register   On: 02/15/2020 09:05   DG CHEST PORT 1 VIEW  Result Date: 02/12/2020 CLINICAL DATA:  Shortness of breath EXAM: PORTABLE CHEST 1 VIEW COMPARISON:  02/11/2020 and 02/10/2020 FINDINGS: Cardiomediastinal contours remain  enlarged. Bilateral effusions similar to prior study with persistent LEFT lower lobe airspace disease and obscured LEFT hemidiaphragm. Increased interstitial markings persist. No acute bone process. IMPRESSION: No interval change in appearance of the chest since prior study. Bilateral effusions with LEFT lower lobe airspace disease. Persistent increased interstitial markings cardiomegaly. Electronically Signed   By: Zetta Bills M.D.   On: 02/12/2020 18:18   DG Chest Port 1 View  Result Date: 02/11/2020 CLINICAL DATA:  Respiratory failure EXAM: PORTABLE CHEST 1 VIEW COMPARISON:  02/10/2020 FINDINGS: Cardiomegaly with vascular congestion. Small left pleural effusion. Left lower lobe atelectasis. Bilateral interstitial prominence may reflect mild interstitial edema. IMPRESSION: Cardiomegaly, suspect mild interstitial edema. Small left effusion with left base atelectasis. Electronically Signed   By: Rolm Baptise M.D.   On: 02/11/2020 02:22   DG CHEST PORT 1 VIEW  Result Date: 02/10/2020 CLINICAL DATA:  Post left thoracentesis EXAM: PORTABLE CHEST 1 VIEW COMPARISON:  02/09/2020 FINDINGS: Interval decrease in the left effusion following thoracentesis. Small left effusion remains. No pneumothorax. Left base atelectasis. Mild cardiomegaly. Right lung clear. IMPRESSION: Decreasing left effusion following thoracentesis.  No pneumothorax. Small left effusion and left base atelectasis. Electronically Signed   By: Rolm Baptise M.D.   On: 02/10/2020 19:13   DG Chest Port 1 View  Result Date: 02/09/2020 CLINICAL DATA:  Short of breath EXAM: PORTABLE CHEST 1 VIEW COMPARISON:  06/24/2018, CT 12/24/2019 FINDINGS: Postoperative changes in the left upper lung. Moderate left pleural effusion increased compared to prior. Probable trace  right effusion. Airspace disease at the left base. Obscured cardiomediastinal silhouette. Hiatal hernia. Convex opacity over the lower mediastinal silhouette. IMPRESSION: 1. Postsurgical  changes of the left chest. Moderate left pleural effusion, increased compared to prior. Probable trace right pleural effusion. Airspace disease at the left lung base. Electronically Signed   By: Donavan Foil M.D.   On: 02/09/2020 18:07   ECHOCARDIOGRAM COMPLETE  Result Date: 02/10/2020    ECHOCARDIOGRAM REPORT   Patient Name:   Kristin [name] Date of Exam: 02/10/2020 Medical Rec #:  [id number]       Height:       66.0 in Accession #:    [id number]      Weight:       127.9 lb Date of Birth:  1941-08-17       BSA:          1.654 m Patient Age:    56 years        BP:           111/58 mmHg Patient Gender: F               HR:           88 bpm. Exam Location:  Inpatient Procedure: 2D Echo and Intracardiac Opacification Agent Indications:    Pulmonary Embolus 415.19 / I26.99  History:        Patient has prior history of Echocardiogram examinations, most                 recent 02/01/2016. Risk Factors:Hypertension. CKD                 Pleural effusion.  Sonographer:    Vikki Ports Turrentine Referring Phys: 64 Rise Patience  Sonographer Comments: Technically difficult study due to poor echo windows. Image acquisition challenging due to patient body habitus. IMPRESSIONS  1. Left ventricular ejection fraction, by estimation, is 50 to 55%. The left ventricle has low normal function. The left ventricle has no regional wall motion abnormalities. Left ventricular diastolic parameters are consistent with Grade I diastolic dysfunction (impaired relaxation).  2. Right ventricular systolic function is moderately reduced. The right ventricular size is moderately enlarged. D-shaped septum suggestive of RV pressure/volume overload. No complete TR doppler jet so unable to estimate PA systolic pressure.  3. The aortic valve is tricuspid. Aortic valve regurgitation is trivial. No aortic stenosis is present.  4. The mitral valve is normal in structure. No evidence of mitral valve regurgitation. No evidence of mitral stenosis.  5. The  inferior vena cava is dilated in size with <50% respiratory variability, suggesting right atrial pressure of 15 mmHg.  6. Left-sided pleural effusion noted. Trivial pericardial effusion. FINDINGS  Left Ventricle: Left ventricular ejection fraction, by estimation, is 50 to 55%. The left ventricle has low normal function. The left ventricle has no regional wall motion abnormalities. Definity contrast agent was given IV to delineate the left ventricular endocardial borders. The left ventricular internal cavity size was normal in size. There is no left ventricular hypertrophy. Left ventricular diastolic parameters are consistent with Grade I diastolic dysfunction (impaired relaxation). Right Ventricle: The right ventricular size is moderately enlarged. No increase in right ventricular wall thickness. Right ventricular systolic function is moderately reduced. Tricuspid regurgitation signal is inadequate for assessing PA pressure. Left Atrium: Left atrial size was normal in size. Right Atrium: Right atrial size was normal in size. Pericardium: Trivial pericardial effusion is present. Mitral Valve: The mitral valve is normal in structure. No evidence  of mitral valve regurgitation. No evidence of mitral valve stenosis. Tricuspid Valve: The tricuspid valve is normal in structure. Tricuspid valve regurgitation is not demonstrated. Aortic Valve: The aortic valve is tricuspid. Aortic valve regurgitation is trivial. No aortic stenosis is present. Pulmonic Valve: The pulmonic valve was normal in structure. Pulmonic valve regurgitation is not visualized. Aorta: The aortic root is normal in size and structure. Venous: The inferior vena cava is dilated in size with less than 50% respiratory variability, suggesting right atrial pressure of 15 mmHg. IAS/Shunts: No atrial level shunt detected by color flow Doppler.  LEFT VENTRICLE PLAX 2D LVOT diam:     1.80 cm  Diastology LV SV:         36       LV e' lateral:   9.36 cm/s LV SV Index:    22       LV E/e' lateral: 3.6 LVOT Area:     2.54 cm LV e' medial:    7.83 cm/s                         LV E/e' medial:  4.3  LEFT ATRIUM           Index LA Vol (A4C): 36.2 ml 21.89 ml/m  AORTIC VALVE LVOT Vmax:   84.90 cm/s LVOT Vmean:  61.100 cm/s LVOT VTI:    0.141 m MITRAL VALVE MV Area (PHT): 3.37 cm    SHUNTS MV Decel Time: 225 msec    Systemic VTI:  0.14 m MV E velocity: 33.40 cm/s  Systemic Diam: 1.80 cm MV A velocity: 59.70 cm/s MV E/A ratio:  0.56 Loralie Champagne MD Electronically signed by Loralie Champagne MD Signature Date/Time: 02/10/2020/4:49:30 PM    Final    CT RENAL STONE STUDY  Result Date: 02/09/2020 CLINICAL DATA:  Flank pain, kidney stone suspected. EXAM: CT ABDOMEN AND PELVIS WITHOUT CONTRAST TECHNIQUE: Multidetector CT imaging of the abdomen and pelvis was performed following the standard protocol without IV contrast. COMPARISON:  Abdominal CT 12/24/2019. Concurrent chest CTA, reported separately. FINDINGS: Lower chest: Left pleural effusion and basilar consolidation assessed on concurrent chest CT, reported separately. Moderately large hiatal hernia, partially obscured by motion artifact. Hepatobiliary: Cyst in the right lobe of the liver is grossly stable, motion artifact partially obscures evaluation. There is some high-density material within the gallbladder that may represent sludge. More detailed evaluation is limited given motion. No biliary dilatation. Pancreas: Motion artifact through the pancreas limits assessment. No ductal dilatation. No obvious peripancreatic inflammation Spleen: Normal in size with small cleft medially. Adrenals/Urinary Tract: No adrenal nodule. No hydronephrosis. No renal or ureteral calculi. Mild bilateral renal parenchymal thinning and cortical scarring in the lower right kidney. There is symmetric bilateral perinephric edema. Urinary bladder is unremarkable, no bladder stone. Stomach/Bowel: Moderately large hiatal hernia. Motion limits more detailed  assessment. No evidence of bowel obstruction. No obvious bowel inflammation, lack of contrast and motion limits detailed bowel assessment. Descending colostomy. Mild descending colonic diverticulosis without diverticulitis chain sutures noted in the cecum. Vascular/Lymphatic: Aorto bi-iliac atherosclerosis. No grossly enlarged abdominopelvic lymph nodes, detailed assessment limited given motion and lack of contrast. Reproductive: Calcified uterine fibroids. No adnexal mass. Other: Mild generalized body wall edema. No ascites. No free air. Musculoskeletal: Left hip arthroplasty. Bones are under mineralized. Stable bone island in the mid sacrum. Multilevel degenerative change in the lumbar spine. IMPRESSION: 1. Motion limited exam. 2. No renal stones or obstructive uropathy. Symmetric bilateral perinephric edema  is nonspecific. 3. Moderately large hiatal hernia. 4. High-density material within the gallbladder may represent sludge. More detailed evaluation is limited given motion artifact. Aortic Atherosclerosis (ICD10-I70.0). Electronically Signed   By: Keith Rake M.D.   On: 02/09/2020 23:30   VAS [place] LOWER EXTREMITY VENOUS (DVT)  Result Date: 02/11/2020  Lower Venous DVTStudy Indications: Pulmonary embolism.  Performing Technologist: June Leap RDMS, RVT  Examination Guidelines: A complete evaluation includes B-mode imaging, spectral Doppler, color Doppler, and power Doppler as needed of all accessible portions of each vessel. Bilateral testing is considered an integral part of a complete examination. Limited examinations for reoccurring indications may be performed as noted. The reflux portion of the exam is performed with the patient in reverse Trendelenburg.  +---------+---------------+---------+-----------+----------+--------------+ RIGHT    CompressibilityPhasicitySpontaneityPropertiesThrombus Aging +---------+---------------+---------+-----------+----------+--------------+ CFV      Full            Yes      Yes                                 +---------+---------------+---------+-----------+----------+--------------+ SFJ      Full                                                        +---------+---------------+---------+-----------+----------+--------------+ FV Prox  Full                                                        +---------+---------------+---------+-----------+----------+--------------+ FV Mid   Full                                                        +---------+---------------+---------+-----------+----------+--------------+ FV DistalFull                                                        +---------+---------------+---------+-----------+----------+--------------+ PFV      Full                                                        +---------+---------------+---------+-----------+----------+--------------+ POP      Full           Yes      Yes                                 +---------+---------------+---------+-----------+----------+--------------+ PTV      Full                                                        +---------+---------------+---------+-----------+----------+--------------+  PERO     Full                                                        +---------+---------------+---------+-----------+----------+--------------+   +---------+---------------+---------+-----------+----------+--------------+ LEFT     CompressibilityPhasicitySpontaneityPropertiesThrombus Aging +---------+---------------+---------+-----------+----------+--------------+ CFV      Full           Yes      Yes                                 +---------+---------------+---------+-----------+----------+--------------+ SFJ      Full                                                        +---------+---------------+---------+-----------+----------+--------------+ FV Prox  Full                                                         +---------+---------------+---------+-----------+----------+--------------+ FV Mid   Full                                                        +---------+---------------+---------+-----------+----------+--------------+ FV DistalFull                                                        +---------+---------------+---------+-----------+----------+--------------+ PFV      Full                                                        +---------+---------------+---------+-----------+----------+--------------+ POP      Full           Yes      Yes                                 +---------+---------------+---------+-----------+----------+--------------+ PTV      None                                                        +---------+---------------+---------+-----------+----------+--------------+ PERO     None                                                        +---------+---------------+---------+-----------+----------+--------------+  Summary: RIGHT: - There is no evidence of deep vein thrombosis in the lower extremity.  - No cystic structure found in the popliteal fossa.  LEFT: - Findings consistent with acute deep vein thrombosis involving the left posterior tibial veins, and left peroneal veins. - No cystic structure found in the popliteal fossa.  *See table(s) above for measurements and observations. Electronically signed by Harold Barban MD on 02/11/2020 at 5:06:34 PM.    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  The patient is currently receiving Tagrisso 80 mg daily and is status post 26 months of treatment.  She has been tolerating her treatment well but recently  started having increasing fatigue and weakness as well as nausea, anorexia, and dehydration.  Her last CT scan in March 2021 showed some evidence concerning for disease progression.  At her most recent office visit, it was recommended for her to have a PET scan as well as an MRI of the brain to further evaluate her disease.  These have not yet been performed.  For her metastatic lung cancer, continue Tagrisso.  Will await results of cytology and if positive for malignant cells, will send for molecular studies.  I have ordered her MRI of the brain with and without contrast for restaging purposes.  Her PET scan is currently scheduled for tomorrow and this will need to be canceled and rescheduled for when she is discharged from the hospital.  For her new pulmonary emboli, continue Xarelto.  For her history of breast cancer, continue letrozole.   LOS: 5 days   Mikey Bussing, DNP, AGPCNP-BC, AOCNP 02/15/20   ADDENDUM: Cytology results from thoracentesis on 02/10/2020 resulted showing malignant cells consistent with primary lung adenocarcinoma.  We will request for pathology to send for molecular studies.

## 2020-02-15 NOTE — Progress Notes (Signed)
PROGRESS NOTE    Kristin [name]  EXB:[id number] DOB: 31-Jul-1941 DOA: 02/09/2020 PCP: Eber Hong, MD    Brief Narrative:  Kristin [name] is a 79 y.o.femalewithhistory of metastatic non-small cell lung cancer with history of colon cancer status post colostomy and history of breast cancer with history of hypertension CVA has been experiencing increasing shortness of breath and weakness over the last few days with increasing weight loss had followed up with her oncologist 3 days ago at that time patient was placed on fluids and empirically started on Macrobid. Despite which patient is still having increasing weakness and the last 2 days had fevers noted at home with temperature 101 F. Which prompted patient to come to the ER.  On admission, creatinine 1.2, elevated alk phos, leukocytosis, hemoglobin 10.6, BNP elevated 717.3. Patient met criteria for SIRS, work-up revealed acute PE, large left pleural effusion. 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  CT angiogram chest segmental pulmonary emboli, interval enlargement partially loculated left pleural effusion; trace right pleural effusion.  Underwent left thoracentesis by pulmonology on 02/10/2020 with 971m bloody fluid removed and cytology  malignant cells present. --Blood cultures x2 02/09/2020: No growth x5 days --Cultures 02/13/2020: No growth x2 days --Pleural fluid culture: Few WBC present on Gram stain, no organisms seen; no growth x3 days --Will discontinue vancomycin/ceftriaxone/Flagyl today. --Continue monitor fever curve, WBC daily --MRI brain with and without contrast for staging ordered by oncology today --Outpatient PET scan, will need to be rescheduled --PCCM recommends weekly chest x-rays symptoms worsen to follow pleural fluid reaccumulation.  They do not recommend Pleurx placement at this time given severe debilitating pain from pleural fluid removal previously. --Continue letrozole 2.5 mg p.o. daily and Tagrisso 889mPO daily --Continue supplemental oxygen, maintain SPO2 greater than 92%  Acute right lower lobe pulmonary embolism Acute left lower extremity DVT Acute hypoxic respiratory failure CT angiogram chest with segmental pulmonary emboli and ultrasound lower extremity with acute DVT left posterior tibial veins and left peroneal vein, no DVT on right. --Continue Xarelto  Paroxysmal atrial fibrillation with RVR --Cardiology following, appreciate assistance --Continue amiodarone 200 mg p.o. daily --Anticoagulation with Xarelto  Acute on chronic diastolic congestive heart failure TTE 02/10/2020 with EF 5010-27%grade 1 diastolic dysfunction, RV systolic function moderately reduced, inferior vena cava dilated in size.  Repeat chest x-ray this morning with continued pulmonary edema and pleural effusion. --Increase furosemide to 40 mg p.o. BID --Continue ACE inhibitor --Continue to monitor strict I's and O's and daily weights  E. coli UTI: Completed 6-day course of ceftriaxone on 02/14/2020  Essential hypertension:  BP 114/46, well controlled --Continue amlodipine 5 mg p.o. daily and lisinopril 20 mg p.o. daily  CKD stage IIIa Creatinine 1.05, stable. --Follow BMP daily with increased dose of furosemide  as above  Normocytic anemia Hemoglobin  8.9, stable.  Questionable hypothyroidism TSH low 0.01, free T4 elevated 1.49, T3 within normal limits.  Also on amiodarone currently. --Recommend repeat thyroid studies in 4 weeks, if TSH continues to be low with elevated free T4, may need to start methimazole  Urinary retention Continues with Foley catheter in place, will attempt voiding trial today. --To monitor urine output closely --bladder scans as needed  Weakness: --PT/OT evaluation  Moderate  protein calorie malnutrition Body mass index is 21.35 kg/m.  Nutrition Status: Nutrition Problem: Moderate Malnutrition Etiology: chronic illness Signs/Symptoms: moderate fat depletion, moderate muscle depletion, severe muscle depletion Interventions: Boost Breeze, Magic cup, [employer], Education  --Continue to encourage increased oral intake   DVT prophylaxis: Xarelto Code Status: Full code Family Communication: Discussed with patient's husband who is present at bedside  Disposition Plan:  Status is: Inpatient  Remains inpatient appropriate because:Ongoing diagnostic testing needed not appropriate for outpatient work up, Unsafe d/c plan, IV treatments appropriate due to intensity of illness or inability to take PO and Inpatient level of care appropriate due to severity of illness   Dispo: The patient is from: Home              Anticipated d/c is to: To be determined, SNF versus home              Anticipated d/c date is: 2 days              Patient currently is not medically stable to d/c.   Consultants:   Cardiology  PCCM  Medical oncology  Procedures:   Left thoracentesis 4/22 by PCCM  Antimicrobials:   Ceftriaxone 4/22>>  Vancomycin 4/21>>  Zosyn 4/21 - 4/21  Flagyl 4/25>>  Anti-infectives (From admission, onward)   Start     Dose/Rate Route Frequency Ordered Stop   02/14/20 1400  vancomycin (VANCOREADY) IVPB 750 mg/150 mL     750 mg 150  mL/hr over 60 Minutes Intravenous Every 24 hours 02/13/20 1304     02/13/20 2200  cefTRIAXone (ROCEPHIN) 2 g in sodium chloride 0.9 % 100 mL IVPB     2 g 200 mL/hr over 30 Minutes Intravenous Daily at bedtime 02/13/20 1250     02/13/20 1400  metroNIDAZOLE (FLAGYL) IVPB 500 mg     500 mg 100 mL/hr over 60 Minutes Intravenous Every 8 hours 02/13/20 1249     02/13/20 1400  vancomycin (VANCOREADY) IVPB 1250 mg/250 mL     1,250 mg 166.7 mL/hr over 90 Minutes Intravenous  Once 02/13/20 1304 02/13/20 1709   02/10/20 0030  cefTRIAXone (ROCEPHIN) 1 g in sodium chloride 0.9 % 100 mL IVPB  Status:  Discontinued     1 g 200 mL/hr over 30 Minutes Intravenous Daily at bedtime 02/10/20 0003 02/13/20 1250   02/09/20 1730  vancomycin (VANCOCIN) IVPB 1000 mg/200 mL premix  Status:  Discontinued     1,000 mg 200 mL/hr over 60 Minutes Intravenous  Once 02/09/20 1723 02/09/20 1724   02/09/20 1730  piperacillin-tazobactam (ZOSYN) IVPB 3.375 g     3.375 g 100 mL/hr over 30 Minutes Intravenous  Once 02/09/20 1723 02/09/20 1818   02/09/20 1730  vancomycin (VANCOREADY) IVPB 1250 mg/250 mL     1,250 mg 166.7 mL/hr over 90 Minutes Intravenous  Once 02/09/20 1724 02/09/20 2001        Subjective: Patient seen and examined bedside, resting comfortably.  Husband present.  Continues with overall weakness, fatigue, shortness of breath.  Oxygen now at 4 L  per nasal cannula.  Cytology returned with malignant cells.  No other questions or concerns at this time.  Denies headache, no chest pain, palpitations, no abdominal pain.  No acute events overnight per nursing staff.  Objective: Vitals:   02/15/20 0800 02/15/20 0900 02/15/20 1000 02/15/20 1100  BP: (!) 119/47 (!) 126/50 (!) 123/49 (!) 120/30  Pulse: 71 73 64 74  Resp: (!) 27 (!) 27 (!) 21 (!) 37  Temp: 98.2 F (36.8 C)     TempSrc: Oral     SpO2: 95% 97% (!) 87% 97%  Weight:      Height:        Intake/Output Summary (Last 24 hours) at 02/15/2020 1208 Last  data filed at 02/15/2020 3716 Gross per 24 hour  Intake 349.54 ml  Output 1425 ml  Net -1075.46 ml   Filed Weights   02/13/20 0500 02/14/20 0500 02/15/20 0457  Weight: 61.9 kg 61.9 kg 60 kg    Examination:  General exam: Appears calm and comfortable  Respiratory system: Clear to auscultation. Respiratory effort normal. Cardiovascular system: S1 & S2 heard, RRR. No JVD, murmurs, rubs, gallops or clicks. No pedal edema. Gastrointestinal system: Abdomen is nondistended, soft and nontender. No organomegaly or masses felt. Normal bowel sounds heard. Central nervous system: Alert and oriented. No focal neurological deficits. Extremities: Symmetric 5 x 5 power. Skin: No rashes, lesions or ulcers Psychiatry: Judgement and insight appear normal. Mood & affect appropriate.     Data Reviewed: I have personally reviewed following labs and imaging studies  CBC: Recent Labs  Lab 02/09/20 1723 02/10/20 0332 02/11/20 0511 02/12/20 0259 02/13/20 0251 02/14/20 0245 02/15/20 0201  WBC 12.1*   < > 12.1* 14.4* 15.9* 16.8* 15.8*  NEUTROABS 9.6*  --   --   --   --   --   --   HGB 10.6*   < > 8.3* 9.3* 9.8* 9.7* 8.9*  HCT 33.5*   < > 26.0* 28.3* 30.1* 29.7* 27.7*  MCV 91.0   < > 91.5 87.9 88.3 88.7 88.5  PLT 165   < > 160 182 170 185 205   < > = values in this interval not displayed.   Basic Metabolic Panel: Recent Labs  Lab 02/11/20 0043 02/11/20 0043 02/11/20 0511 02/12/20 0259 02/13/20 0251 02/14/20 0245 02/15/20 0201  NA 135   < > 135  136 134* 136 141 138  K 4.8   < > 4.6  4.6 4.6 3.7 3.9 3.4*  CL 105   < > 104  106 103 105 107 105  CO2 18*   < > 20*  21* 21* 22 22 25   GLUCOSE 158*   < > 143*  144* 122* 123* 116* 109*  BUN 30*   < > 33*  35* 36* 32* 38* 37*  CREATININE 1.03*   < > 0.98  0.99 1.01* 0.93 0.98 1.05*  CALCIUM 9.3   < > 9.5  9.6 9.4 8.9 8.8* 8.6*  MG 2.1  --   --   --   --   --   --    < > = values in this interval not displayed.   GFR: Estimated  Creatinine Clearance: 40 mL/min (A) (by C-G formula based on SCr of 1.05 mg/dL (H)). Liver Function Tests: Recent Labs  Lab 02/09/20 1723 02/11/20 0511  AST 55* 69*  67*  ALT 58* 71*  69*  ALKPHOS 186* 138*  133*  BILITOT 0.7 0.6  0.5  PROT  6.6 6.0*  5.9*  ALBUMIN 2.9* 3.2*  3.1*   No results for input(s): LIPASE, AMYLASE in the last 168 hours. No results for input(s): AMMONIA in the last 168 hours. Coagulation Profile: Recent Labs  Lab 02/09/20 1723  INR 1.3*   Cardiac Enzymes: No results for input(s): CKTOTAL, CKMB, CKMBINDEX, TROPONINI in the last 168 hours. BNP (last 3 results) No results for input(s): PROBNP in the last 8760 hours. HbA1C: No results for input(s): HGBA1C in the last 72 hours. CBG: No results for input(s): GLUCAP in the last 168 hours. Lipid Profile: No results for input(s): CHOL, HDL, LDLCALC, TRIG, CHOLHDL, LDLDIRECT in the last 72 hours. Thyroid Function Tests: No results for input(s): TSH, T4TOTAL, FREET4, T3FREE, THYROIDAB in the last 72 hours. Anemia Panel: No results for input(s): VITAMINB12, FOLATE, FERRITIN, TIBC, IRON, RETICCTPCT in the last 72 hours. Sepsis Labs: Recent Labs  Lab 02/09/20 1733 02/09/20 2010 02/13/20 1315  PROCALCITON  --   --  0.37  LATICACIDVEN 1.6 1.0  --     Recent Results (from the past 240 hour(s))  Blood Culture (routine x 2)     Status: None   Collection Time: 02/09/20  5:33 PM   Specimen: BLOOD RIGHT FOREARM  Result Value Ref Range Status   Specimen Description   Final    BLOOD RIGHT FOREARM Performed at [facility] [address] [place], [place] [address]    Special Requests   Final    BOTTLES DRAWN AEROBIC AND ANAEROBIC Blood Culture results may not be optimal due to an inadequate volume of blood received in culture bottles Performed at [facility] [address], [place], [place] [address]    Culture   Final    NO GROWTH 5 DAYS Performed at [facility], [place] [address] [place], [place] [address]    Report Status 02/14/2020 FINAL  Final  Blood Culture (routine x 2)     Status: None   Collection Time: 02/09/20  5:33 PM   Specimen: BLOOD  Result Value Ref Range Status   Specimen Description   Final    BLOOD RIGHT ANTECUBITAL Performed at [facility] [address], [place], [place] [address]    Special Requests   Final    BOTTLES DRAWN AEROBIC AND ANAEROBIC Blood Culture results may not be optimal due to an inadequate volume of blood received in culture bottles Performed at [facility] [address], [place], [place] [address]    Culture   Final    NO GROWTH 5 DAYS Performed at [facility], [place] [address] [place], [place] [address]    Report Status 02/14/2020 FINAL  Final  Urine culture     Status: Abnormal   Collection Time: 02/09/20  8:36 PM   Specimen: In/Out Cath Urine  Result Value Ref Range Status   Specimen Description   Final    IN/OUT CATH URINE Performed at [facility] [address] [place], [place] [address]    Special Requests   Final    NONE Performed at [facility], [place] [address], [place], [place] [address]    Culture 5,000 COLONIES/mL ESCHERICHIA COLI (A)  Final   Report Status 02/11/2020 FINAL  Final   Organism ID, Bacteria ESCHERICHIA COLI (A)  Final      Susceptibility   Escherichia coli - MIC*    AMPICILLIN 8 SENSITIVE Sensitive     CEFAZOLIN <=  4 SENSITIVE Sensitive     CEFTRIAXONE <=1 SENSITIVE Sensitive     CIPROFLOXACIN >=4 RESISTANT Resistant     GENTAMICIN >=16 RESISTANT Resistant     IMIPENEM <=0.25 SENSITIVE Sensitive     NITROFURANTOIN <=16 SENSITIVE Sensitive     TRIMETH/SULFA <=20 SENSITIVE Sensitive     AMPICILLIN/SULBACTAM 4 SENSITIVE Sensitive     PIP/TAZO <=4 SENSITIVE Sensitive     * 5,000 COLONIES/mL ESCHERICHIA COLI  SARS CORONAVIRUS 2 (TAT 6-24 HRS) Nasopharyngeal  Nasopharyngeal Swab     Status: None   Collection Time: 02/10/20  3:12 AM   Specimen: Nasopharyngeal Swab  Result Value Ref Range Status   SARS Coronavirus 2 NEGATIVE NEGATIVE Final    Comment: (NOTE) SARS-CoV-2 target nucleic acids are NOT DETECTED. The SARS-CoV-2 RNA is generally detectable in upper and lower respiratory specimens during the acute phase of infection. Negative results do not preclude SARS-CoV-2 infection, do not rule out co-infections with other pathogens, and should not be used as the sole basis for treatment or other patient management decisions. Negative results must be combined with clinical observations, patient history, and epidemiological information. The expected result is Negative. Fact Sheet for Patients: SugarRoll.be Fact Sheet for Healthcare Providers: https://www.woods-mathews.com/ This test is not yet approved or cleared by the [place] FDA and  has been authorized for detection and/or diagnosis of SARS-CoV-2 by FDA under an Emergency Use Authorization (EUA). This EUA will remain  in effect (meaning this test can be used) for the duration of the COVID-19 declaration under Section 56 4(b)(1) of the Act, 21 U.S.C. section 360bbb-3(b)(1), unless the authorization is terminated or revoked sooner. Performed at [facility], [place] [address] [place], [place] [address]   MRSA PCR Screening     Status: None   Collection Time: 02/10/20  3:32 PM   Specimen: Nasal Mucosa; Nasopharyngeal  Result Value Ref Range Status   MRSA by PCR NEGATIVE NEGATIVE Final    Comment:        The GeneXpert MRSA Assay (FDA approved for NASAL specimens only), is one component of a comprehensive MRSA colonization surveillance program. It is not intended to diagnose MRSA infection nor to guide or monitor treatment for MRSA infections. Performed at [facility], [place] [address], [place], [place] [address]     Body fluid culture (includes gram stain)     Status: None   Collection Time: 02/10/20  6:23 PM   Specimen: Pleural Fluid  Result Value Ref Range Status   Specimen Description   Final    PLEURAL LEFT Performed at [facility] [address] [place], [place] [address]    Special Requests   Final    Normal Performed at [facility], [place] [address] [place], [place] [address]    Gram Stain   Final    FEW WBC PRESENT, PREDOMINANTLY MONONUCLEAR NO ORGANISMS SEEN    Culture   Final    NO GROWTH 3 DAYS Performed at [facility] [address], [place], [place] [address]    Report Status 02/14/2020 FINAL  Final  Culture, blood (Routine X 2) w Reflex to ID Panel     Status: None (Preliminary result)   Collection Time: 02/13/20  1:16 PM   Specimen: Left Antecubital; Blood  Result Value Ref Range Status   Specimen Description   Final    LEFT ANTECUBITAL Performed at [facility] [address], [place], [place] [address]  Special Requests   Final    BOTTLES DRAWN AEROBIC ONLY Blood Culture adequate volume Performed at [facility] [address] [place], [place] [address]    Culture   Final    NO GROWTH 2 DAYS Performed at [facility] [address], [place], [place] [address]    Report Status PENDING  Incomplete  Culture, blood (Routine X 2) w Reflex to ID Panel     Status: None (Preliminary result)   Collection Time: 02/13/20  1:19 PM   Specimen: BLOOD LEFT HAND  Result Value Ref Range Status   Specimen Description   Final    BLOOD LEFT HAND Performed at [facility] [address], [place], [place] [address]    Special Requests   Final    BOTTLES DRAWN AEROBIC ONLY Blood Culture adequate volume Performed at [facility] [address], [place], [place] [address]    Culture   Final    NO GROWTH 2 DAYS Performed at [facility] [address] [place],  [address]    Report Status PENDING  Incomplete         Radiology Studies: DG Chest Port 1 View  Result Date: 02/15/2020 CLINICAL DATA:  Pleural effusions. EXAM: PORTABLE CHEST 1 VIEW COMPARISON:  Chest x-ray 02/12/2020, 02/11/2020. FINDINGS: Cardiomegaly. Diffuse bilateral pulmonary infiltrates/edema, progressed from prior exam. CHF could present this fashion. Bilateral pneumonia cannot be excluded. Bilateral pleural effusions again noted. No pneumothorax. Surgical sutures noted over the left upper chest. Surgical clips noted over the right chest. No acute bony abnormality identified. IMPRESSION: Cardiomegaly. Diffuse progressive bilateral pulmonary infiltrates/edema. Bilateral pleural effusions again noted. Electronically Signed   By: Marcello Moores  Register   On: 02/15/2020 09:05        Scheduled Meds: . amiodarone  200 mg Oral Daily  . amLODipine  5 mg Oral Daily  . brimonidine  1 drop Right Eye BID   And  . timolol  1 drop Right Eye BID  . Chlorhexidine Gluconate Cloth  6 each Topical Daily  . cholecalciferol  2,000 Units Oral Daily  . clopidogrel  75 mg Oral Daily  . cycloSPORINE  1 drop Both Eyes BID  . escitalopram  10 mg Oral Daily  . famotidine  20 mg Oral Daily  . feeding supplement  1 Container Oral BID BM  . feeding supplement (ENSURE ENLIVE)  237 mL Oral BID BM  . feeding supplement (PRO-STAT SUGAR FREE 64)  30 mL Oral BID  . furosemide  40 mg Oral Daily  . letrozole  2.5 mg Oral Daily  . lisinopril  20 mg Oral Daily  . mouth rinse  15 mL Mouth Rinse BID  . multivitamin with minerals  1 tablet Oral Daily  . osimertinib mesylate  80 mg Oral Daily  . pantoprazole  40 mg Oral QAC breakfast  . psyllium  1 packet Oral Daily  . Rivaroxaban  15 mg Oral BID WC   Followed by  . [START ON 03/03/2020] rivaroxaban  20 mg Oral Q supper   Continuous Infusions: . sodium chloride Stopped (02/11/20 0020)  . cefTRIAXone (ROCEPHIN)  IV Stopped  (02/14/20 2156)  . metronidazole Stopped (02/15/20 0211)  . vancomycin Stopped (02/14/20 1651)     LOS: 5 days    Time spent: 41 minutes spent on chart review, discussion with nursing staff, consultants, updating family and interview/physical exam; more than 50% of that time was spent in counseling  and/or coordination of care.    Josyah Achor J [place] DO [facility] Available via Epic secure chat 7am-7pm After these hours, please refer to coverage provider listed on [email or web address] 02/15/2020, 12:08 PM

## 2020-02-15 NOTE — Progress Notes (Signed)
ANTICOAGULATION CONSULT NOTE - Follow Up Consult  Pharmacy Consult for Xarelto Indication: acute pulmonary embolus  Allergies  Allergen Reactions  . Doxycycline Nausea Only    Weak, stomach  . Sulfa Antibiotics Rash    Patient Measurements: Height: 5\' 6"  (167.6 cm) Weight: 60 kg (132 lb 4.4 oz) IBW/kg (Calculated) : 59.3 Heparin Dosing Weight: 58 kg  Vital Signs: Temp: 98.2 F (36.8 C) (04/27 0800) Temp Source: Oral (04/27 0800) BP: 111/50 (04/27 1200) Pulse Rate: 71 (04/27 1200)  Labs: Recent Labs    02/13/20 0251 02/13/20 0251 02/14/20 0245 02/15/20 0201  HGB 9.8*   < > 9.7* 8.9*  HCT 30.1*  --  29.7* 27.7*  PLT 170  --  185 205  CREATININE 0.93  --  0.98 1.05*   < > = values in this interval not displayed.    Estimated Creatinine Clearance: 40 mL/min (A) (by C-G formula based on SCr of 1.05 mg/dL (H)).  Assessment: Patient's an 79 y.o F with hx breast, colon and metastatic non-small cell lung cancer on Tagrisso and Femara PTA, presented to the ED on 4/21 with c/o SOB.  Chest CTA was positive for acute PE and left pleural effusion.  LE dopplers + DVT.  Pharmacy consulted for xarelto dosing.  Today, 02/15/2020: Scr 1.05 CBC:  Hgb low/stable at 8.9, Plt wnl No bleeding or complications reported.  Goal of Therapy:  Monitor platelets by anticoagulation protocol: Yes   Plan:  - Xarelto 15mg  po twice daily x 21 days then - Xarelto 20 po once daily thereafter - Pharmacy education completed on 4/24.  Gretta Arab PharmD, BCPS Clinical Pharmacist [facility] main pharmacy [phone] 02/15/2020 12:43 PM

## 2020-02-15 NOTE — Progress Notes (Signed)
Oncology Nurse Navigator Documentation  Oncology Nurse Navigator Flowsheets 02/09/2020  Navigator Location [facility]  Navigator Encounter Type Telephone  Telephone Outgoing Call  Patient Visit Type MedOnc  Treatment Phase Treatment  Barriers/Navigation Needs Coordination of Care;Education  Education Other/per Dr. Julien Nordmann, I requested cytology to be sent for foundation one and PDL 1  Interventions Coordination of Care;Education  Acuity Level 2-Minimal Needs (1-2 Barriers Identified)  Coordination of Care Other  Time Spent with Patient 57

## 2020-02-16 ENCOUNTER — Ambulatory Visit (HOSPITAL_COMMUNITY): Admission: RE | Admit: 2020-02-16 | Payer: Medicare Other | Source: Ambulatory Visit

## 2020-02-16 DIAGNOSIS — J9601 Acute respiratory failure with hypoxia: Secondary | ICD-10-CM | POA: Diagnosis not present

## 2020-02-16 DIAGNOSIS — I2699 Other pulmonary embolism without acute cor pulmonale: Secondary | ICD-10-CM | POA: Diagnosis not present

## 2020-02-16 DIAGNOSIS — C3490 Malignant neoplasm of unspecified part of unspecified bronchus or lung: Secondary | ICD-10-CM | POA: Diagnosis not present

## 2020-02-16 DIAGNOSIS — N390 Urinary tract infection, site not specified: Secondary | ICD-10-CM | POA: Diagnosis not present

## 2020-02-16 LAB — BASIC METABOLIC PANEL
Anion gap: 8 (ref 5–15)
BUN: 42 mg/dL — ABNORMAL HIGH (ref 8–23)
CO2: 24 mmol/L (ref 22–32)
Calcium: 8.6 mg/dL — ABNORMAL LOW (ref 8.9–10.3)
Chloride: 107 mmol/L (ref 98–111)
Creatinine, Ser: 1.1 mg/dL — ABNORMAL HIGH (ref 0.44–1.00)
GFR calc Af Amer: 55 mL/min — ABNORMAL LOW (ref >60)
GFR calc non Af Amer: 47 mL/min — ABNORMAL LOW (ref >60)
Glucose, Bld: 121 mg/dL — ABNORMAL HIGH (ref 70–99)
Potassium: 4.2 mmol/L (ref 3.5–5.1)
Sodium: 139 mmol/L (ref 135–145)

## 2020-02-16 LAB — MAGNESIUM: Magnesium: 2.4 mg/dL (ref 1.7–2.4)

## 2020-02-16 MED ORDER — SODIUM CHLORIDE 0.9 % IV BOLUS
250.0000 mL | Freq: Once | INTRAVENOUS | Status: AC
  Administered 2020-02-16: 250 mL via INTRAVENOUS

## 2020-02-16 MED ORDER — LISINOPRIL 10 MG PO TABS
10.0000 mg | ORAL_TABLET | Freq: Every day | ORAL | Status: DC
  Administered 2020-02-17 – 2020-02-19 (×3): 10 mg via ORAL
  Filled 2020-02-16 (×3): qty 1

## 2020-02-16 NOTE — Evaluation (Signed)
Occupational Therapy Evaluation Patient Details Name: Kristin [name] MRN: [id number] DOB: 11/14/1940 Today's Date: 02/16/2020    History of Present Illness Pt is an 79 year old woman admitted on 02/10/20 with SOB, weakness, weight loss and fever. + PE and pleural effusion. PMH: lung ca, colon ca with colostomy, breast ca, HTN, CVA, CKD III, afib.   Clinical Impression   Pt was ambulating with a RW short distances and needing assistance for IADL leading up to hospitalization as she was becoming more weak and SOB. Pt presents with generalized weakness, poor standing balance and decreased activity tolerance. Pt requires 2 person assist for OOB. Daughter plans to use FMLA and together with pt's husband will care for her at home upon discharge. Will plan on HHOT depending on how pt progresses. Family is adamant they do not want pt to go to a SNF. Will follow acutely.    Follow Up Recommendations  Home health OT;Supervision/Assistance - 24 hour    Equipment Recommendations  None recommended by OT    Recommendations for Other Services       Precautions / Restrictions Precautions Precautions: Fall Precaution Comments: monitor BP Restrictions Weight Bearing Restrictions: No      Mobility Bed Mobility Overal bed mobility: Needs Assistance Bed Mobility: Supine to Sit     Supine to sit: Min assist     General bed mobility comments: min assist to position hips at EOB with bed pad, increased time   Transfers Overall transfer level: Needs assistance Equipment used: Rolling walker (2 wheeled) Transfers: Sit to/from [facility] Sit to Stand: +2 physical assistance;Mod assist Stand pivot transfers: +2 physical assistance;Mod assist       General transfer comment: assist to rise and steady, leaning L    Balance Overall balance assessment: Needs assistance   Sitting balance-Leahy Scale: Good       Standing balance-Leahy Scale: Poor Standing balance comment:  reliant on B UE and external support                           ADL either performed or assessed with clinical judgement   ADL Overall ADL's : Needs assistance/impaired Eating/Feeding: Set up;Sitting   Grooming: Minimal assistance;Sitting   Upper Body Bathing: Minimal assistance;Sitting   Lower Body Bathing: +2 for physical assistance;Sit to/from stand;Total assistance   Upper Body Dressing : Minimal assistance;Sitting   Lower Body Dressing: +2 for safety/equipment;Sit to/from stand;Total assistance   Toilet Transfer: +2 for physical assistance;Moderate assistance;Stand-pivot;BSC;RW   Toileting- Clothing Manipulation and Hygiene: Total assistance;+2 for physical assistance;Sit to/from stand         General ADL Comments: pt on 2L 02, fatigues easily     Vision Baseline Vision/History: Wears glasses Patient Visual Report: No change from baseline       Perception     Praxis      Pertinent Vitals/Pain Pain Assessment: No/denies pain     Hand Dominance Right   Extremity/Trunk Assessment Upper Extremity Assessment Upper Extremity Assessment: Generalized weakness   Lower Extremity Assessment Lower Extremity Assessment: Defer to PT evaluation   Cervical / Trunk Assessment Cervical / Trunk Assessment: Kyphotic   Communication Communication Communication: HOH   Cognition Arousal/Alertness: Awake/alert Behavior During Therapy: WFL for tasks assessed/performed Overall Cognitive Status: Within Functional Limits for tasks assessed  General Comments       Exercises     Shoulder Instructions      Home Living Family/patient expects to be discharged to:: Private residence Living Arrangements: Spouse/significant other Available Help at Discharge: Family;Available 24 hours/day(daughter lives next door) Type of Home: House Home Access: Level entry     Home Layout: Multi-level(one step into den and onto  sun porch)     Bathroom Shower/Tub: Teacher, early years/pre: Standard     Home Equipment: Environmental consultant - 2 wheels;Wheelchair - manual;Shower seat;Hand held shower head;Bedside commode          Prior Functioning/Environment Level of Independence: Independent with assistive device(s)        Comments: was walking with RW to bathroom, but fairly sedentary due to SOB in the days leading up to admission        OT Problem List: Decreased strength;Decreased activity tolerance;Impaired balance (sitting and/or standing);Decreased knowledge of use of DME or AE;Cardiopulmonary status limiting activity      OT Treatment/Interventions: Self-care/ADL training;Energy conservation;DME and/or AE instruction;Therapeutic activities;Patient/family education;Balance training    OT Goals(Current goals can be found in the care plan section) Acute Rehab OT Goals Patient Stated Goal: to return home OT Goal Formulation: With patient Time For Goal Achievement: 03/01/20 Potential to Achieve Goals: Good ADL Goals Pt Will Perform Grooming: with min guard assist;standing(one activity) Pt Will Perform Upper Body Dressing: sitting;with supervision Pt Will Perform Lower Body Dressing: with min assist;sit to/from stand Pt Will Transfer to Toilet: with min assist;ambulating;bedside commode Pt Will Perform Toileting - Clothing Manipulation and hygiene: with min assist;sit to/from stand Pt/caregiver will Perform Home Exercise Program: Increased strength;Both right and left upper extremity;With Supervision([facility]) Additional ADL Goal #1: Pt will generalize energy conservation and breathing techniques in ADL with minimal verbal cues.  OT Frequency: Min 2X/week   Barriers to D/C:            Co-evaluation PT/OT/SLP Co-Evaluation/Treatment: Yes Reason for Co-Treatment: For patient/therapist safety;Complexity of the patient's impairments (multi-system involvement)   OT goals addressed during session: ADL's  and self-care;Proper use of Adaptive equipment and DME      AM-PAC OT "6 Clicks" Daily Activity     Outcome Measure Help from another person eating meals?: A Little Help from another person taking care of personal grooming?: A Little Help from another person toileting, which includes using toliet, bedpan, or urinal?: Total Help from another person bathing (including washing, rinsing, drying)?: A Lot Help from another person to put on and taking off regular upper body clothing?: A Little Help from another person to put on and taking off regular lower body clothing?: Total 6 Click Score: 13   End of Session Equipment Utilized During Treatment: Rolling walker;Oxygen(2L) Nurse Communication: (aware daughter is concerned about urine output)  Activity Tolerance: Patient tolerated treatment well Patient left: in chair;with call bell/phone within reach;with family/visitor present  OT Visit Diagnosis: Unsteadiness on feet (R26.81);Muscle weakness (generalized) (M62.81)                Time: 4627-0350 OT Time Calculation (min): 30 min Charges:  OT General Charges $OT Visit: 1 Visit OT Evaluation $OT Eval Moderate Complexity: 1 Mod  Kristin [name], OTR/L Acute Rehabilitation Services Pager: [phone] Office: [phone]  Kristin [name] 02/16/2020, 1:35 PM

## 2020-02-16 NOTE — Progress Notes (Signed)
Text paged Sharlet Salina, NP to make her aware that patient's blood pressure has been low since giving percocet around 2300. Current BP 93/43 ( 58). Patient arouses to voice and A&Ox4.

## 2020-02-16 NOTE — Evaluation (Signed)
Physical Therapy Evaluation Patient Details Name: Kristin [name] MRN: [id number] DOB: [birth year] Today's Date: 02/16/2020   History of Present Illness  Pt is an 79 year old woman admitted on 02/10/20 with SOB, weakness, weight loss and fever. + PE and pleural effusion. PMH: lung ca, colon ca with colostomy, breast ca, HTN, CVA, CKD III, afib.  Clinical Impression  The patient presents with significant weakness but was able to stand and  Take small steps to recliner from the bed. Patient noted to be listing to left when standing. Patient's BP post transfer 105/35. No C/O dizziness. SPO2 on 2 L 95% and RR 34 with activity. Daughter present and expresses desire for patient to return home if strength returns enough to be able to mobilize to BR, etc. Pt admitted with above diagnosis.  Pt currently with functional limitations due to the deficits listed below (see PT Problem List). Pt will benefit from skilled PT to increase their independence and safety with mobility to allow discharge to the venue listed below.       Follow Up Recommendations Home health PT;Supervision/Assistance - 24 hour    Equipment Recommendations  (TBA for DC home)    Recommendations for Other Services       Precautions / Restrictions Precautions Precautions: Fall Precaution Comments: monitor BP Restrictions Weight Bearing Restrictions: No      Mobility  Bed Mobility Overal bed mobility: Needs Assistance Bed Mobility: Supine to Sit     Supine to sit: Min assist     General bed mobility comments: min assist to position hips at EOB with bed pad, increased time   Transfers Overall transfer level: Needs assistance Equipment used: Rolling walker (2 wheeled) Transfers: Sit to/from [facility] Sit to Stand: +2 physical assistance;Mod assist Stand pivot transfers: +2 physical assistance;Mod assist       General transfer comment: assist to rise and steady, leaning L  Ambulation/Gait                Stairs            Wheelchair Mobility    Modified Rankin (Stroke Patients Only)       Balance Overall balance assessment: Needs assistance   Sitting balance-Leahy Scale: Good       Standing balance-Leahy Scale: Poor Standing balance comment: reliant on B UE and external support                             Pertinent Vitals/Pain Pain Assessment: No/denies pain    Home Living Family/patient expects to be discharged to:: Private residence Living Arrangements: Spouse/significant other;Children Available Help at Discharge: Family;Available 24 hours/day Type of Home: House Home Access: Level entry     Home Layout: Multi-level Home Equipment: Walker - 2 wheels;Wheelchair - manual;Shower seat;Hand held shower head;Bedside commode      Prior Function Level of Independence: Independent with assistive device(s)         Comments: was walking with RW to bathroom, but fairly sedentary due to SOB in the days leading up to admission     Hand Dominance   Dominant Hand: Right    Extremity/Trunk Assessment   Upper Extremity Assessment Upper Extremity Assessment: Generalized weakness    Lower Extremity Assessment Lower Extremity Assessment: Generalized weakness    Cervical / Trunk Assessment Cervical / Trunk Assessment: Kyphotic  Communication   Communication: HOH  Cognition Arousal/Alertness: Awake/alert Behavior During Therapy: WFL for tasks assessed/performed Overall Cognitive Status:  Within Functional Limits for tasks assessed                                        General Comments      Exercises     Assessment/Plan    PT Assessment Patient needs continued PT services  PT Problem List Decreased strength;Decreased knowledge of precautions;Decreased mobility;Decreased knowledge of use of DME;Cardiopulmonary status limiting activity;Decreased activity tolerance;Decreased safety awareness       PT Treatment  Interventions DME instruction;Patient/family education;Functional mobility training;Gait training;Therapeutic activities;Therapeutic exercise    PT Goals (Current goals can be found in the Care Plan section)  Acute Rehab PT Goals Patient Stated Goal: to return home PT Goal Formulation: With patient/family Time For Goal Achievement: 03/01/20 Potential to Achieve Goals: Fair    Frequency Min 3X/week   Barriers to discharge        Co-evaluation PT/OT/SLP Co-Evaluation/Treatment: Yes Reason for Co-Treatment: For patient/therapist safety PT goals addressed during session: Mobility/safety with mobility OT goals addressed during session: ADL's and self-care;Proper use of Adaptive equipment and DME       AM-PAC PT "6 Clicks" Mobility  Outcome Measure Help needed turning from your back to your side while in a flat bed without using bedrails?: A Little Help needed moving from lying on your back to sitting on the side of a flat bed without using bedrails?: A Little Help needed moving to and from a bed to a chair (including a wheelchair)?: A Lot Help needed standing up from a chair using your arms (e.g., wheelchair or bedside chair)?: A Lot Help needed to walk in hospital room?: Total Help needed climbing 3-5 steps with a railing? : Total 6 Click Score: 12    End of Session   Activity Tolerance: Patient limited by fatigue Patient left: in chair;with call bell/phone within reach;with family/visitor present Nurse Communication: Mobility status PT Visit Diagnosis: Unsteadiness on feet (R26.81);Difficulty in walking, not elsewhere classified (R26.2)    Time: 6950-7225 PT Time Calculation (min) (ACUTE ONLY): 23 min   Charges:   PT Evaluation $PT Eval Low Complexity: [place] [facility] Acute Rehabilitation Services Pager [phone] Office [phone]   Claretha Cooper 02/16/2020, 1:57 PM

## 2020-02-16 NOTE — Progress Notes (Signed)
Received pt from ICU, tele verified, O2 placed on 2 liters sats 92%. Spouse at bedside with patient. SRP, RN

## 2020-02-16 NOTE — Progress Notes (Signed)
PROGRESS NOTE    [name]  GLO:[id number] DOB: April 20, 1941 DOA: 02/09/2020 PCP: Eber Hong, MD    Brief Narrative:  Kristin [name] is a 79 y.o.femalewithhistory of metastatic non-small cell lung cancer with history of colon cancer status post colostomy and history of breast cancer with history of hypertension CVA has been experiencing increasing shortness of breath and weakness over the last few days with increasing weight loss had followed up with her oncologist 3 days ago at that time patient was placed on fluids and empirically started on Macrobid. Despite which patient is still having increasing weakness and the last 2 days had fevers noted at home with temperature 101 F. Which prompted patient to come to the ER.  On admission, creatinine 1.2, elevated alk phos, leukocytosis, hemoglobin 10.6, BNP elevated 717.3. Patient met criteria for SIRS, work-up revealed acute PE, large left pleural effusion. 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  CT angiogram chest segmental pulmonary emboli, interval enlargement partially loculated left pleural effusion; trace right pleural effusion.  Underwent left thoracentesis by pulmonology on 02/10/2020 with 955m bloody fluid removed and cytology  malignant cells present.  MRI brain with and without contrast with no metastatic disease noted. --Blood cultures x2 02/09/2020: No growth x5 days --Cultures 02/13/2020: No growth x 3 days --Pleural fluid culture: Few WBC present on Gram stain, no organisms seen; no growth x3 days --Discontinue vancomycin/ceftriaxone/Flagyl 4/27. --Continue monitor fever curve, WBC daily --Outpatient PET scan, will need to be rescheduled --PCCM recommends weekly chest x-rays (next 5/4) or if symptoms worsen to follow pleural fluid reaccumulation.  They do not recommend Pleurx placement at this time given severe debilitating pain from pleural fluid removal previously. --Furosemide 40 mg p.o. twice daily --Continue letrozole 2.5 mg p.o. daily and Tagrisso 84mPO daily --Continue supplemental oxygen, maintain SPO2 greater than 92%, now down to 2 L nasal cannula  Acute right lower lobe pulmonary embolism Acute left lower extremity DVT Acute hypoxic respiratory failure CT angiogram chest with segmental pulmonary emboli and ultrasound lower extremity with acute DVT left posterior tibial veins and left peroneal vein, no DVT on right. --Continue Xarelto  Paroxysmal atrial fibrillation with RVR --Cardiology following, appreciate assistance --Continue amiodarone 200 mg p.o. daily --Anticoagulation with Xarelto --Continue to monitor on telemetry  Acute on chronic diastolic congestive heart failure TTE 02/10/2020 with EF 5018-84%grade 1 diastolic dysfunction, RV systolic function moderately reduced, inferior vena cava dilated in size.  Repeat chest x-ray this morning with continued pulmonary edema and pleural effusion. --Continue furosemide to 40 mg p.o. BID --Continue ACE inhibitor --Continue to monitor strict I's and O's and daily weights  E. coli UTI: Completed 6-day course of ceftriaxone on 02/14/2020  Essential hypertension:  BP 96/34 --Continue amlodipine 5 mg p.o. daily --Decrease lisinopril from 20 mg to  10  mg p.o. daily --Continue monitor blood pressure closely and will continue to adjust accordingly  CKD stage IIIa Creatinine 1.10, stable. --Follow BMP daily with increased dose of furosemide as above  Normocytic anemia Hemoglobin 8.9, stable.  Questionable hypothyroidism TSH low 0.01, free T4 elevated 1.49, T3 within normal limits.  Also on amiodarone currently. --Recommend repeat thyroid studies in 4 weeks, if TSH continues to be low with elevated free T4, may need to start methimazole  Urinary retention Foley catheter removed on 02/15/2020.  Appears to be voiding independently. --Continue to monitor urine output closely --bladder scans as needed  Weakness: --PT/OT evaluation pending  Moderate  protein calorie malnutrition Body mass index is 21.03 kg/m.  Nutrition Status: Nutrition Problem: Moderate Malnutrition Etiology: chronic illness Signs/Symptoms: moderate fat depletion, moderate muscle depletion, severe muscle depletion Interventions: Boost Breeze, Magic cup, [employer], Education  --Continue to encourage increased oral intake   DVT prophylaxis: Xarelto Code Status: Full code Family Communication: Discussed with patient's daughter who is present at bedside  Disposition Plan:  Status is: Inpatient  Remains inpatient appropriate because:Ongoing diagnostic testing needed not appropriate for outpatient work up, Unsafe d/c plan, IV treatments appropriate due to intensity of illness or inability to take PO and Inpatient level of care appropriate due to severity of illness awaiting PT/OT evaluation.   Dispo: The patient is from: Home              Anticipated d/c is to: To be determined, SNF versus home              Anticipated d/c date is: 2 days              Patient currently is not medically stable to d/c.   Consultants:   Cardiology  PCCM -signed off 02/15/2020  Medical oncology  Procedures:   Left thoracentesis 4/22 by  PCCM  Antimicrobials:   Ceftriaxone 4/22>>  Vancomycin 4/21>>  Zosyn 4/21 - 4/21  Flagyl 4/25>>  Anti-infectives (From admission, onward)   Start     Dose/Rate Route Frequency Ordered Stop   02/14/20 1400  vancomycin (VANCOREADY) IVPB 750 mg/150 mL  Status:  Discontinued     750 mg 150 mL/hr over 60 Minutes Intravenous Every 24 hours 02/13/20 1304 02/15/20 1227   02/13/20 2200  cefTRIAXone (ROCEPHIN) 2 g in sodium chloride 0.9 % 100 mL IVPB  Status:  Discontinued     2 g 200 mL/hr over 30 Minutes Intravenous Daily at bedtime 02/13/20 1250 02/15/20 1227   02/13/20 1400  metroNIDAZOLE (FLAGYL) IVPB 500 mg  Status:  Discontinued     500 mg 100 mL/hr over 60 Minutes Intravenous Every 8 hours 02/13/20 1249 02/15/20 1227   02/13/20 1400  vancomycin (VANCOREADY) IVPB 1250 mg/250 mL     1,250 mg 166.7 mL/hr over 90 Minutes Intravenous  Once 02/13/20 1304 02/13/20 1709   02/10/20 0030  cefTRIAXone (ROCEPHIN) 1 g in sodium chloride 0.9 % 100 mL IVPB  Status:  Discontinued     1 g 200 mL/hr over 30 Minutes Intravenous Daily at bedtime 02/10/20 0003 02/13/20 1250   02/09/20 1730  vancomycin (VANCOCIN) IVPB 1000 mg/200 mL premix  Status:  Discontinued     1,000 mg 200 mL/hr over 60 Minutes Intravenous  Once 02/09/20 1723 02/09/20 1724   02/09/20 1730  piperacillin-tazobactam (ZOSYN) IVPB 3.375 g     3.375 g 100 mL/hr over 30 Minutes Intravenous  Once 02/09/20 1723 02/09/20 1818   02/09/20 1730  vancomycin (VANCOREADY)  IVPB 1250 mg/250 mL     1,250 mg 166.7 mL/hr over 90 Minutes Intravenous  Once 02/09/20 1724 02/09/20 2001        Subjective: Patient seen and examined bedside, resting comfortably.  Daughter present.  Dyspnea slightly improved, but not back at her normal baseline.  Continues on supplemental oxygen, now titrated down to 2 L per nasal cannula.  Pending transfer to floor.  Awaiting PT/OT evaluation.  No other questions or concerns at this time.  Denies headache, no chest  pain, palpitations, no abdominal pain.  No acute events overnight per nursing staff.  Objective: Vitals:   02/16/20 0500 02/16/20 0600 02/16/20 0700 02/16/20 1001  BP: (!) 87/36 (!) 96/34 (!) 102/38 (!) 96/48  Pulse: 66 63 65   Resp: (!) 22 (!) 21 (!) 25   Temp:      TempSrc:      SpO2: 100% 100% 100%   Weight:      Height:        Intake/Output Summary (Last 24 hours) at 02/16/2020 1054 Last data filed at 02/16/2020 1962 Gross per 24 hour  Intake 370 ml  Output 1200 ml  Net -830 ml   Filed Weights   02/14/20 0500 02/15/20 0457 02/16/20 0408  Weight: 61.9 kg 60 kg 59.1 kg    Examination:  General exam: Appears calm and comfortable  Respiratory system: Clear to auscultation. Respiratory effort normal. Cardiovascular system: S1 & S2 heard, RRR. No JVD, murmurs, rubs, gallops or clicks. No pedal edema. Gastrointestinal system: Abdomen is nondistended, soft and nontender. No organomegaly or masses felt. Normal bowel sounds heard. Central nervous system: Alert and oriented. No focal neurological deficits. Extremities: Symmetric 5 x 5 power. Skin: No rashes, lesions or ulcers Psychiatry: Judgement and insight appear normal. Mood & affect appropriate.     Data Reviewed: I have personally reviewed following labs and imaging studies  CBC: Recent Labs  Lab 02/09/20 1723 02/10/20 0332 02/11/20 0511 02/12/20 0259 02/13/20 0251 02/14/20 0245 02/15/20 0201  WBC 12.1*   < > 12.1* 14.4* 15.9* 16.8* 15.8*  NEUTROABS 9.6*  --   --   --   --   --   --   HGB 10.6*   < > 8.3* 9.3* 9.8* 9.7* 8.9*  HCT 33.5*   < > 26.0* 28.3* 30.1* 29.7* 27.7*  MCV 91.0   < > 91.5 87.9 88.3 88.7 88.5  PLT 165   < > 160 182 170 185 205   < > = values in this interval not displayed.   Basic Metabolic Panel: Recent Labs  Lab 02/11/20 0043 02/11/20 0511 02/12/20 0259 02/13/20 0251 02/14/20 0245 02/15/20 0201 02/16/20 0228  NA 135   < > 134* 136 141 138 139  K 4.8   < > 4.6 3.7 3.9 3.4* 4.2   CL 105   < > 103 105 107 105 107  CO2 18*   < > 21* 22 22 25 24   GLUCOSE 158*   < > 122* 123* 116* 109* 121*  BUN 30*   < > 36* 32* 38* 37* 42*  CREATININE 1.03*   < > 1.01* 0.93 0.98 1.05* 1.10*  CALCIUM 9.3   < > 9.4 8.9 8.8* 8.6* 8.6*  MG 2.1  --   --   --   --   --  2.4   < > = values in this interval not displayed.   GFR: Estimated Creatinine Clearance: 38.1 mL/min (A) (by C-G formula based on  SCr of 1.1 mg/dL (H)). Liver Function Tests: Recent Labs  Lab 02/09/20 1723 02/11/20 0511  AST 55* 69*  67*  ALT 58* 71*  69*  ALKPHOS 186* 138*  133*  BILITOT 0.7 0.6  0.5  PROT 6.6 6.0*  5.9*  ALBUMIN 2.9* 3.2*  3.1*   No results for input(s): LIPASE, AMYLASE in the last 168 hours. No results for input(s): AMMONIA in the last 168 hours. Coagulation Profile: Recent Labs  Lab 02/09/20 1723  INR 1.3*   Cardiac Enzymes: No results for input(s): CKTOTAL, CKMB, CKMBINDEX, TROPONINI in the last 168 hours. BNP (last 3 results) No results for input(s): PROBNP in the last 8760 hours. HbA1C: No results for input(s): HGBA1C in the last 72 hours. CBG: No results for input(s): GLUCAP in the last 168 hours. Lipid Profile: No results for input(s): CHOL, HDL, LDLCALC, TRIG, CHOLHDL, LDLDIRECT in the last 72 hours. Thyroid Function Tests: No results for input(s): TSH, T4TOTAL, FREET4, T3FREE, THYROIDAB in the last 72 hours. Anemia Panel: No results for input(s): VITAMINB12, FOLATE, FERRITIN, TIBC, IRON, RETICCTPCT in the last 72 hours. Sepsis Labs: Recent Labs  Lab 02/09/20 1733 02/09/20 2010 02/13/20 1315  PROCALCITON  --   --  0.37  LATICACIDVEN 1.6 1.0  --     Recent Results (from the past 240 hour(s))  Blood Culture (routine x 2)     Status: None   Collection Time: 02/09/20  5:33 PM   Specimen: BLOOD RIGHT FOREARM  Result Value Ref Range Status   Specimen Description   Final    BLOOD RIGHT FOREARM Performed at [facility] [address]  [place], [place] [address]    Special Requests   Final    BOTTLES DRAWN AEROBIC AND ANAEROBIC Blood Culture results may not be optimal due to an inadequate volume of blood received in culture bottles Performed at [facility] [address], [place], [place] [address]    Culture   Final    NO GROWTH 5 DAYS Performed at [facility], [place] [address], [place], [place] [address]    Report Status 02/14/2020 FINAL  Final  Blood Culture (routine x 2)     Status: None   Collection Time: 02/09/20  5:33 PM   Specimen: BLOOD  Result Value Ref Range Status   Specimen Description   Final    BLOOD RIGHT ANTECUBITAL Performed at [facility] [address], [place], [place] [address]    Special Requests   Final    BOTTLES DRAWN AEROBIC AND ANAEROBIC Blood Culture results may not be optimal due to an inadequate volume of blood received in culture bottles Performed at [facility] [address] [place], [place] [address]    Culture   Final    NO GROWTH 5 DAYS Performed at [facility], [place] [address] [place], [place] [address]    Report Status 02/14/2020 FINAL  Final  Urine culture     Status: Abnormal   Collection Time: 02/09/20  8:36 PM   Specimen: In/Out Cath Urine  Result Value Ref Range Status   Specimen Description   Final    IN/OUT CATH URINE Performed at [facility] [address] [place], [place] [address]    Special Requests   Final    NONE Performed at [facility], [place] [address] [place], [place] [address]    Culture 5,000 COLONIES/mL ESCHERICHIA COLI (A)  Final  Report Status 02/11/2020 FINAL  Final   Organism ID, Bacteria ESCHERICHIA COLI (A)  Final      Susceptibility   Escherichia coli - MIC*    AMPICILLIN 8 SENSITIVE Sensitive     CEFAZOLIN <=4 SENSITIVE Sensitive     CEFTRIAXONE <=1 SENSITIVE Sensitive     CIPROFLOXACIN >=4 RESISTANT Resistant      GENTAMICIN >=16 RESISTANT Resistant     IMIPENEM <=0.25 SENSITIVE Sensitive     NITROFURANTOIN <=16 SENSITIVE Sensitive     TRIMETH/SULFA <=20 SENSITIVE Sensitive     AMPICILLIN/SULBACTAM 4 SENSITIVE Sensitive     PIP/TAZO <=4 SENSITIVE Sensitive     * 5,000 COLONIES/mL ESCHERICHIA COLI  SARS CORONAVIRUS 2 (TAT 6-24 HRS) Nasopharyngeal Nasopharyngeal Swab     Status: None   Collection Time: 02/10/20  3:12 AM   Specimen: Nasopharyngeal Swab  Result Value Ref Range Status   SARS Coronavirus 2 NEGATIVE NEGATIVE Final    Comment: (NOTE) SARS-CoV-2 target nucleic acids are NOT DETECTED. The SARS-CoV-2 RNA is generally detectable in upper and lower respiratory specimens during the acute phase of infection. Negative results do not preclude SARS-CoV-2 infection, do not rule out co-infections with other pathogens, and should not be used as the sole basis for treatment or other patient management decisions. Negative results must be combined with clinical observations, patient history, and epidemiological information. The expected result is Negative. Fact Sheet for Patients: SugarRoll.be Fact Sheet for Healthcare Providers: https://www.woods-mathews.com/ This test is not yet approved or cleared by the [place] FDA and  has been authorized for detection and/or diagnosis of SARS-CoV-2 by FDA under an Emergency Use Authorization (EUA). This EUA will remain  in effect (meaning this test can be used) for the duration of the COVID-19 declaration under Section 56 4(b)(1) of the Act, 21 U.S.C. section 360bbb-3(b)(1), unless the authorization is terminated or revoked sooner. Performed at [facility], [place] [address], [place], [place] [address]   MRSA PCR Screening     Status: None   Collection Time: 02/10/20  3:32 PM   Specimen: Nasal Mucosa; Nasopharyngeal  Result Value Ref Range Status   MRSA by PCR NEGATIVE NEGATIVE Final    Comment:          The GeneXpert MRSA Assay (FDA approved for NASAL specimens only), is one component of a comprehensive MRSA colonization surveillance program. It is not intended to diagnose MRSA infection nor to guide or monitor treatment for MRSA infections. Performed at [facility], [place] [address], [place], [place] [address]   Body fluid culture (includes gram stain)     Status: None   Collection Time: 02/10/20  6:23 PM   Specimen: Pleural Fluid  Result Value Ref Range Status   Specimen Description   Final    PLEURAL LEFT Performed at [facility] [address] [place], [place] [address]    Special Requests   Final    Normal Performed at [facility], [place] [address], [place], [place] [address]    Gram Stain   Final    FEW WBC PRESENT, PREDOMINANTLY MONONUCLEAR NO ORGANISMS SEEN    Culture   Final    NO GROWTH 3 DAYS Performed at [facility] [address] [place], [place] [address]    Report Status 02/14/2020 FINAL  Final  Culture, blood (Routine X 2) w Reflex to ID Panel     Status: None (Preliminary result)   Collection Time: 02/13/20  1:16  PM   Specimen: Left Antecubital; Blood  Result Value Ref Range Status   Specimen Description   Final    LEFT ANTECUBITAL Performed at [name] [address], [place], [place] [address]    Special Requests   Final    BOTTLES DRAWN AEROBIC ONLY Blood Culture adequate volume Performed at [facility] [address] [place], [place] [address]    Culture   Final    NO GROWTH 3 DAYS Performed at [facility], [place] [address], [place], [place] [address]    Report Status PENDING  Incomplete  Culture, blood (Routine X 2) w Reflex to ID Panel     Status: None (Preliminary result)   Collection Time: 02/13/20  1:19 PM   Specimen: BLOOD LEFT HAND  Result Value Ref Range Status   Specimen Description   Final    BLOOD LEFT  HAND Performed at [facility] [address], [place], [place] [address]    Special Requests   Final    BOTTLES DRAWN AEROBIC ONLY Blood Culture adequate volume Performed at [facility] [address], [place], [place] [address]    Culture   Final    NO GROWTH 3 DAYS Performed at [facility], [place] [address] [place], [place] [address]    Report Status PENDING  Incomplete         Radiology Studies: MR Brain W Wo Contrast  Result Date: 02/15/2020 CLINICAL DATA:  Non-small cell lung cancer.  Staging. EXAM: MRI HEAD WITHOUT AND WITH CONTRAST TECHNIQUE: Multiplanar, multiecho pulse sequences of the brain and surrounding structures were obtained without and with intravenous contrast. CONTRAST:  84m GADAVIST GADOBUTROL 1 MMOL/ML IV SOLN COMPARISON:  04/30/2013 FINDINGS: Brain: Diffusion imaging does not show any acute or subacute infarction. No abnormality affects the brainstem or cerebellum. Cerebral hemispheres show minimal small vessel change of the white matter, less than usually seen at this age. No cortical or large vessel territory infarction. No mass lesion, hemorrhage, hydrocephalus or extra-axial collection. Incidental venous angioma left frontal lobe. Vascular: Major vessels at the base of the brain show flow. Skull and upper cervical spine: No evidence of metastatic disease. 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 Sinuses/Orbits: Clear/normal Other: None IMPRESSION: No evidence of metastatic disease. Minimal small vessel change of the white matter, less than often seen at this age. Left parietal calvarial hemangioma, unchanged since 2014. No evidence of skull or skull base metastatic disease. Electronically Signed   By: MNelson ChimesM.D.   On: 02/15/2020 14:30   DG Chest Port 1 View  Result Date: 02/15/2020 CLINICAL DATA:  Pleural effusions. EXAM: PORTABLE CHEST 1 VIEW COMPARISON:  Chest x-ray 02/12/2020, 02/11/2020.  FINDINGS: Cardiomegaly. Diffuse bilateral pulmonary infiltrates/edema, progressed from prior exam. CHF could present this fashion. Bilateral pneumonia cannot be excluded. Bilateral pleural effusions again noted. No pneumothorax. Surgical sutures noted over the left upper chest. Surgical clips noted over the right chest. No acute bony abnormality identified. IMPRESSION: Cardiomegaly. Diffuse progressive bilateral pulmonary infiltrates/edema. Bilateral pleural effusions again noted. Electronically Signed   By: TMarcello Moores Register   On: 02/15/2020 09:05        Scheduled Meds: . amiodarone  200 mg Oral Daily  . amLODipine  5 mg Oral Daily  . brimonidine  1 drop Right Eye BID   And  . timolol  1 drop Right Eye BID  . Chlorhexidine Gluconate Cloth  6 each Topical Daily  .  cholecalciferol  2,000 Units Oral Daily  . clopidogrel  75 mg Oral Daily  . cycloSPORINE  1 drop Both Eyes BID  . escitalopram  10 mg Oral Daily  . famotidine  20 mg Oral Daily  . feeding supplement  1 Container Oral BID BM  . feeding supplement (ENSURE ENLIVE)  237 mL Oral BID BM  . feeding supplement (PRO-STAT SUGAR FREE 64)  30 mL Oral BID  . furosemide  40 mg Oral BID  . letrozole  2.5 mg Oral Daily  . lisinopril  20 mg Oral Daily  . mouth rinse  15 mL Mouth Rinse BID  . multivitamin with minerals  1 tablet Oral Daily  . osimertinib mesylate  80 mg Oral Daily  . pantoprazole  40 mg Oral QAC breakfast  . psyllium  1 packet Oral Daily  . Rivaroxaban  15 mg Oral BID WC   Followed by  . [START ON 03/03/2020] rivaroxaban  20 mg Oral Q supper   Continuous Infusions: . sodium chloride Stopped (02/11/20 0020)     LOS: 6 days    Time spent: 37 minutes spent on chart review, discussion with nursing staff, consultants, updating family and interview/physical exam; more than 50% of that time was spent in counseling and/or coordination of care.    Tylor Gambrill J [place] DO [facility] Available via Epic secure chat  7am-7pm After these hours, please refer to coverage provider listed on [email or web address] 02/16/2020, 10:54 AM

## 2020-02-16 NOTE — Progress Notes (Signed)
BP low since giving percocet around 2230 last night.  Sharlet Salina, NP placed order for bolus which is infusing at this time. Patient arouses to voice easily and remains A&Ox4.

## 2020-02-17 DIAGNOSIS — I4819 Other persistent atrial fibrillation: Secondary | ICD-10-CM | POA: Diagnosis present

## 2020-02-17 DIAGNOSIS — N390 Urinary tract infection, site not specified: Secondary | ICD-10-CM | POA: Diagnosis not present

## 2020-02-17 DIAGNOSIS — C3412 Malignant neoplasm of upper lobe, left bronchus or lung: Secondary | ICD-10-CM | POA: Diagnosis not present

## 2020-02-17 DIAGNOSIS — J9601 Acute respiratory failure with hypoxia: Secondary | ICD-10-CM | POA: Diagnosis not present

## 2020-02-17 DIAGNOSIS — I2699 Other pulmonary embolism without acute cor pulmonale: Secondary | ICD-10-CM | POA: Diagnosis not present

## 2020-02-17 DIAGNOSIS — C3490 Malignant neoplasm of unspecified part of unspecified bronchus or lung: Secondary | ICD-10-CM | POA: Diagnosis not present

## 2020-02-17 DIAGNOSIS — C3491 Malignant neoplasm of unspecified part of right bronchus or lung: Secondary | ICD-10-CM | POA: Diagnosis not present

## 2020-02-17 LAB — BASIC METABOLIC PANEL
Anion gap: 9 (ref 5–15)
BUN: 40 mg/dL — ABNORMAL HIGH (ref 8–23)
CO2: 24 mmol/L (ref 22–32)
Calcium: 8.8 mg/dL — ABNORMAL LOW (ref 8.9–10.3)
Chloride: 105 mmol/L (ref 98–111)
Creatinine, Ser: 1 mg/dL (ref 0.44–1.00)
GFR calc Af Amer: >60 mL/min (ref >60)
GFR calc non Af Amer: 53 mL/min — ABNORMAL LOW (ref >60)
Glucose, Bld: 106 mg/dL — ABNORMAL HIGH (ref 70–99)
Potassium: 4.5 mmol/L (ref 3.5–5.1)
Sodium: 138 mmol/L (ref 135–145)

## 2020-02-17 LAB — CBC
HCT: 28.3 % — ABNORMAL LOW (ref 36.0–46.0)
Hemoglobin: 8.9 g/dL — ABNORMAL LOW (ref 12.0–15.0)
MCH: 28.7 pg (ref 26.0–34.0)
MCHC: 31.4 g/dL (ref 30.0–36.0)
MCV: 91.3 fL (ref 80.0–100.0)
Platelets: 263 10*3/uL (ref 150–400)
RBC: 3.1 MIL/uL — ABNORMAL LOW (ref 3.87–5.11)
RDW: 14.1 % (ref 11.5–15.5)
WBC: 13.4 10*3/uL — ABNORMAL HIGH (ref 4.0–10.5)
nRBC: 0 % (ref 0.0–0.2)

## 2020-02-17 LAB — MAGNESIUM: Magnesium: 2.3 mg/dL (ref 1.7–2.4)

## 2020-02-17 MED ORDER — BOOST / RESOURCE BREEZE PO LIQD CUSTOM
1.0000 | ORAL | Status: DC
  Administered 2020-02-18: 1 via ORAL

## 2020-02-17 NOTE — Progress Notes (Signed)
Pt returned to bed---from [facility]. Very weak, pt was not able to assist. FULL total body pivot. Pt sat on 2 liters sitting, 93%, pt desat to 85% on 2l during transfer then recovered about 5 minutes at 93-95%. SRP, RN

## 2020-02-17 NOTE — Progress Notes (Signed)
Occupational Therapy Treatment Patient Details Name: Kristin [name] MRN: [id number] DOB: [birth year] Today's Date: 02/17/2020    History of present illness Pt is an 79 year old woman admitted on 02/10/20 with SOB, weakness, weight loss and fever. + PE and pleural effusion. PMH: lung ca, colon ca with colostomy, breast ca, HTN, CVA, CKD III, afib.   OT comments  Patient continues to present with weakness, poor endurance, poor balance and limited activity tolerance. Patient requiring various assistance levels for sit to stand depending on height of surface she is standing from and fatigue level. Initially patient min assist to stand with RW from elevated bed. Mod assist from [facility] and max assist to stand pivot from straight back chair to recliner near the end of treatment. Patient max assist for toileting due to needing to hold onto walker and impaired balance. Patient has one posterior loss of balance that therapist corrected and several episodes of knees buckling that were able to be corrected while holding onto walker. Patient able to ambulate 6 feet with walker before needing rest break and inability to continue requiring max stand pivot to recliner.  Attempted RA with patient. Patient's o2 sat dropped to 88%. After transfer to [facility] patient's oxygen dropped to 86% on 2L. Patient increased to 3L to recover and during rest of activity.  Long conversation with daughter in regards to [place]. This therapist recommended short term rehab at discharge but family persistent about going home. Discussed DME with daughter that would be needed (reports they have RW, BSC, [place], and are asking for a wc) and recommended HH OT/PT. Family would like some education and training on how to move/assist patient at home. Plan to f/u with family tomorrow.    Follow Up Recommendations  SNF;Home health OT(Therapist recommends SNF at discharge. Family and patient seem set on taking her home. HH OT and PT at discharge if that continues  to be the discharge plan.)    Equipment Recommendations       Recommendations for Other Services      Precautions / Restrictions Precautions Precautions: Fall Restrictions Weight Bearing Restrictions: No       Mobility Bed Mobility         Supine to sit: Min assist     General bed mobility comments: Min assist to transfer to edge of bed needing increased time and hand hold from therapist to pull herself forward at edge of bed.  Transfers       Sit to Stand: Min assist;Mod assist;Max assist         General transfer comment: After toileting patient sat at edge of bed. Patient min assist to stand from elevated bed with RW and another leg buckling episode. Patient able to walk approx 6 feet to straight back chair as she was unable to make it around the bed to the recliner. Attempted stand with walker but patient could not stand from such a low level. Patient max assist stand pivot to recliner placed on her right. Patient reports significant fatigue. O2 at 100% on 3L returned o2 to 2L. Therapist demonstrated exercises that patient could perform while in seated position to impove upper and lower extremity strength and mobility. Therapist recommended patient go to SNF at discharge due to patient's limited mobility and weakness and requiring significant assistance. Therapsist discussed equipment needs at home if family continues to choose that discharge plan. Patient's daughter wants education on how to move/transfer/assist patient at home.    Balance  Standing balance comment: Reliant on walker for standing, one posterior loss of balance, knees buckling.                           ADL either performed or assessed with clinical judgement   ADL                           Toilet Transfer: Moderate assistance;Minimal assistance   Toileting- Clothing Manipulation and Hygiene: Maximal assistance(max assist for clothing management and patient  having to hold onto walker.)         General ADL Comments: Patient found on 2 liters of oxygen. Attempted to perfom activity on RA but patient dropped to 88% on RA. O2 redonned. Patient min assist to stand from elevated bed holding onto RW. Patient required tactile cues to come into full standing with two episoded of buckling lower extremity and one posterior loss of balance. Patient min assist to take a few steps BSC with RW. Patient's o2 sat dropped to 86% on 2L with minimal activity. Patient required 3 L to recover and maintain at 3L during rest of treatment. Patient mod assist to stand from [facility] while patient pushing up from [facility] arms. Patient transferred back to edge of bed. Patient already reporting fatigue after limited activity.  Patient's daughter in room. Therapist discussing patient's POC with family and reporting that patient needed a lot of assistance still.     Vision       Perception     Praxis      Cognition Arousal/Alertness: Awake/alert Behavior During Therapy: WFL for tasks assessed/performed Overall Cognitive Status: Within Functional Limits for tasks assessed                                          Exercises     Shoulder Instructions       General Comments      Pertinent Vitals/ Pain       Pain Assessment: No/denies pain  Home Living                                          Prior Functioning/Environment              Frequency  Min 2X/week        Progress Toward Goals  OT Goals(current goals can now be found in the care plan section)        Plan      Co-evaluation          OT goals addressed during session: ADL's and self-care;Proper use of Adaptive equipment and DME;Strengthening/ROM;Other (comment)(functionalm mobility)      AM-PAC OT "6 Clicks" Daily Activity     Outcome Measure                    End of Session Equipment Utilized During Treatment: Gait belt;Rolling walker  OT  Visit Diagnosis: Unsteadiness on feet (R26.81);Muscle weakness (generalized) (M62.81)   Activity Tolerance Patient limited by fatigue   Patient Left in chair;with call bell/phone within reach;with family/visitor present   Nurse Communication          Time: 8315-1761 OT Time Calculation (min): 49 min  Charges: OT Treatments $Self Care/Home  Management : 23-37 mins $Therapeutic Activity: 8-22 mins  Derl Barrow, OTR/L Acute Care Rehab Services  Office [place] 02/17/2020, 3:53 PM

## 2020-02-17 NOTE — Progress Notes (Signed)
PROGRESS NOTE    Kristin [name]  YIF:[id number] DOB: February 11, 1941 DOA: 02/09/2020 PCP: Eber Hong, MD    Brief Narrative:  Kristin [name] is a 79 y.o.femalewithhistory of metastatic non-small cell lung cancer with history of colon cancer status post colostomy and history of breast cancer with history of hypertension CVA has been experiencing increasing shortness of breath and weakness over the last few days with increasing weight loss had followed up with her oncologist 3 days ago at that time patient was placed on fluids and empirically started on Macrobid. Despite which patient is still having increasing weakness and the last 2 days had fevers noted at home with temperature 101 F. Which prompted patient to come to the ER.  On admission, creatinine 1.2, elevated alk phos, leukocytosis, hemoglobin 10.6, BNP elevated 717.3. Patient met criteria for SIRS, work-up revealed acute PE, large left pleural effusion. 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  CT angiogram chest segmental pulmonary emboli, interval enlargement partially loculated left pleural effusion; trace right pleural effusion.  Underwent left thoracentesis by pulmonology on 02/10/2020 with 916m  bloody fluid removed and cytology malignant cells present.  MRI brain with and without contrast with no metastatic disease noted. --Blood cultures x2 02/09/2020: No growth x5 days --Blood cultures 02/13/2020: No growth x 4 days --Pleural fluid culture: Few WBC present on Gram stain, no organisms seen; no growth x3 days --Discontinued vancomycin/ceftriaxone/Flagyl 4/27. --Continue monitor fever curve, WBC daily --Outpatient PET scan, will need to be rescheduled --PCCM recommends weekly chest x-rays (next 5/4) or if symptoms worsen to follow pleural fluid reaccumulation.  They do not recommend Pleurx placement at this time given severe debilitating pain from pleural fluid removal previously. --Furosemide 40 mg p.o. twice daily --Continue letrozole 2.5 mg p.o. daily and Tagrisso 881mPO daily --Continue supplemental oxygen, maintain SPO2 greater than 92%, now down to 2 L nasal cannula --Has follow-up scheduled with medical oncology on 02/22/2020  Acute right lower lobe pulmonary embolism Acute left lower extremity DVT Acute hypoxic respiratory failure CT angiogram chest with segmental pulmonary emboli and ultrasound lower extremity with acute DVT left posterior tibial veins and left peroneal vein, no DVT on right. --Continue Xarelto  Paroxysmal atrial fibrillation with RVR --Cardiology following, appreciate assistance --Continue amiodarone 200 mg p.o. daily --Anticoagulation with Xarelto --Continue to monitor on telemetry  Acute on chronic diastolic congestive heart failure TTE 02/10/2020 with EF 5086-76%grade 1 diastolic dysfunction, RV systolic function moderately reduced, inferior vena cava dilated in size.  Repeat chest x-ray this morning with continued pulmonary edema and pleural effusion. --Continue furosemide to 40 mg p.o. BID --Continue ACE inhibitor --Continue to monitor strict I's and O's and daily weights  E. coli UTI: Completed 6-day course of ceftriaxone on 02/14/2020  Essential  hypertension:  BP 96/34 --Continue amlodipine 5 mg p.o. daily --Decrease lisinopril from 20 mg to 10 mg p.o. daily --Continue monitor blood pressure closely and will continue to adjust accordingly  CKD stage IIIa Creatinine 1.10, stable. --Follow BMP daily with increased dose of furosemide as above  Normocytic anemia Hemoglobin 8.9, stable.  Questionable hypothyroidism TSH low 0.01, free T4 elevated 1.49, T3 within normal limits.  Also on amiodarone currently. --Recommend repeat thyroid studies in 4 weeks, if TSH continues to be low with elevated free T4, may need to start methimazole  Urinary retention Foley catheter removed on 02/15/2020.  Appears to be voiding independently. --Continue to monitor urine output closely --bladder scans as needed  Weakness: --PT/OT recommend home health PT/OT.  Given her severe deconditioning/debility, patient would benefit from a manual wheelchair until her strength improves.  Family has declined SNF placement.  Moderate  protein calorie malnutrition Body mass index is 21.18 kg/m.  Nutrition Status: Nutrition Problem: Moderate Malnutrition Etiology: chronic illness Signs/Symptoms: moderate fat depletion, moderate muscle depletion, severe muscle depletion Interventions: Boost Breeze, Magic cup, [employer], Education  --Continue to encourage increased oral intake   DVT prophylaxis: Xarelto Code Status: Full code Family Communication: Discussed with patient's daughter who is present at bedside  Disposition Plan:  Status is: Inpatient  Remains inpatient appropriate because:Ongoing diagnostic testing needed not appropriate for outpatient work up, Unsafe d/c plan, IV treatments appropriate due to intensity of illness or inability to take PO and Inpatient level of care appropriate due to severity of illness awaiting PT/OT evaluation.   Dispo: The patient is from: Home              Anticipated d/c is to: Home with home health,  family has declined SNF              Anticipated d/c date is: 1 day              Patient currently is not medically stable to d/c.   Consultants:   Cardiology  PCCM -signed off 02/15/2020  Medical oncology  Procedures:   Left thoracentesis 4/22 by PCCM  Antimicrobials:   Ceftriaxone 4/22 - 4/27  Vancomycin 4/21 - 4/27  Zosyn 4/21 - 4/21  Flagyl 4/25 - 4/27     Subjective: Patient seen and examined bedside, resting comfortably.  Daughter present.  Continues with significant debility, weakness and gait disturbance.  Daughter concerned about her coming home without certain equipment such as manual wheelchair given her significant need of assistance with ADLs.  Continues on supplemental oxygen, now titrated down to 2 L per nasal cannula.  No other questions or concerns at this time.  Denies headache, no chest pain, palpitations, no abdominal pain.  No acute events overnight per nursing staff.  Objective: Vitals:   02/17/20 0310 02/17/20 0434 02/17/20 0721 02/17/20 1219  BP: (!) 112/58  124/73 (!) 95/45  Pulse: 70  76 68  Resp:   18 18  Temp: 98.2 F (36.8 C)  97.8 F (36.6 C) 98.1 F (36.7 C)  TempSrc:      SpO2: 94%  92% 91%  Weight:  59.5 kg    Height:        Intake/Output Summary (Last 24 hours) at 02/17/2020 1314 Last data filed at 02/17/2020 0925 Gross per 24 hour  Intake 120 ml  Output 1200 ml  Net -1080 ml   Filed Weights   02/15/20 0457 02/16/20 0408 02/17/20 0434  Weight: 60 kg 59.1 kg 59.5 kg  Examination:  General exam: Appears calm and comfortable, thin in appearance Respiratory system: Clear to auscultation. Respiratory effort normal.  On 2 L nasal cannula oxygenating 92% Cardiovascular system: S1 & S2 heard, RRR. No JVD, murmurs, rubs, gallops or clicks. No pedal edema. Gastrointestinal system: Abdomen is nondistended, soft and nontender. No organomegaly or masses felt. Normal bowel sounds heard. Central nervous system: Alert and oriented.  No focal neurological deficits. Extremities: Symmetric 5 x 5 power. Skin: No rashes, lesions or ulcers Psychiatry: Judgement and insight appear normal. Mood & affect appropriate.    Data Reviewed: I have personally reviewed following labs and imaging studies  CBC: Recent Labs  Lab 02/12/20 0259 02/13/20 0251 02/14/20 0245 02/15/20 0201 02/17/20 0334  WBC 14.4* 15.9* 16.8* 15.8* 13.4*  HGB 9.3* 9.8* 9.7* 8.9* 8.9*  HCT 28.3* 30.1* 29.7* 27.7* 28.3*  MCV 87.9 88.3 88.7 88.5 91.3  PLT 182 170 185 205 683   Basic Metabolic Panel: Recent Labs  Lab 02/11/20 0043 02/11/20 0511 02/13/20 0251 02/14/20 0245 02/15/20 0201 02/16/20 0228 02/17/20 0334  NA 135   < > 136 141 138 139 138  K 4.8   < > 3.7 3.9 3.4* 4.2 4.5  CL 105   < > 105 107 105 107 105  CO2 18*   < > 22 22 25 24 24   GLUCOSE 158*   < > 123* 116* 109* 121* 106*  BUN 30*   < > 32* 38* 37* 42* 40*  CREATININE 1.03*   < > 0.93 0.98 1.05* 1.10* 1.00  CALCIUM 9.3   < > 8.9 8.8* 8.6* 8.6* 8.8*  MG 2.1  --   --   --   --  2.4 2.3   < > = values in this interval not displayed.   GFR: Estimated Creatinine Clearance: 42 mL/min (by C-G formula based on SCr of 1 mg/dL). Liver Function Tests: Recent Labs  Lab 02/11/20 0511  AST 69*  67*  ALT 71*  69*  ALKPHOS 138*  133*  BILITOT 0.6  0.5  PROT 6.0*  5.9*  ALBUMIN 3.2*  3.1*   No results for input(s): LIPASE, AMYLASE in the last 168 hours. No results for input(s): AMMONIA in the last 168 hours. Coagulation Profile: No results for input(s): INR, PROTIME in the last 168 hours. Cardiac Enzymes: No results for input(s): CKTOTAL, CKMB, CKMBINDEX, TROPONINI in the last 168 hours. BNP (last 3 results) No results for input(s): PROBNP in the last 8760 hours. HbA1C: No results for input(s): HGBA1C in the last 72 hours. CBG: No results for input(s): GLUCAP in the last 168 hours. Lipid Profile: No results for input(s): CHOL, HDL, LDLCALC, TRIG, CHOLHDL, LDLDIRECT in  the last 72 hours. Thyroid Function Tests: No results for input(s): TSH, T4TOTAL, FREET4, T3FREE, THYROIDAB in the last 72 hours. Anemia Panel: No results for input(s): VITAMINB12, FOLATE, FERRITIN, TIBC, IRON, RETICCTPCT in the last 72 hours. Sepsis Labs: Recent Labs  Lab 02/13/20 1315  PROCALCITON 0.37    Recent Results (from the past 240 hour(s))  Blood Culture (routine x 2)     Status: None   Collection Time: 02/09/20  5:33 PM   Specimen: BLOOD RIGHT FOREARM  Result Value Ref Range Status   Specimen Description   Final    BLOOD RIGHT FOREARM Performed at [facility] [address] [place], [place] [address]    Special Requests   Final    BOTTLES DRAWN AEROBIC AND ANAEROBIC Blood Culture results may  not be optimal due to an inadequate volume of blood received in culture bottles Performed at [facility] [address], [place], [place] [address]    Culture   Final    NO GROWTH 5 DAYS Performed at [facility], [place] [address] [place], [place] [address]    Report Status 02/14/2020 FINAL  Final  Blood Culture (routine x 2)     Status: None   Collection Time: 02/09/20  5:33 PM   Specimen: BLOOD  Result Value Ref Range Status   Specimen Description   Final    BLOOD RIGHT ANTECUBITAL Performed at [facility] [address] [place], [place] [address]    Special Requests   Final    BOTTLES DRAWN AEROBIC AND ANAEROBIC Blood Culture results may not be optimal due to an inadequate volume of blood received in culture bottles Performed at [facility] [address] [place], [place] [address]    Culture   Final    NO GROWTH 5 DAYS Performed at [facility], [place] [address], [place], [place] [address]    Report Status 02/14/2020 FINAL  Final  Urine culture     Status: Abnormal   Collection Time: 02/09/20  8:36 PM   Specimen: In/Out Cath Urine  Result Value Ref Range Status    Specimen Description   Final    IN/OUT CATH URINE Performed at [facility] [address], [place], [place] [address]    Special Requests   Final    NONE Performed at [facility], [place] [address], [place], [place] [address]    Culture 5,000 COLONIES/mL ESCHERICHIA COLI (A)  Final   Report Status 02/11/2020 FINAL  Final   Organism ID, Bacteria ESCHERICHIA COLI (A)  Final      Susceptibility   Escherichia coli - MIC*    AMPICILLIN 8 SENSITIVE Sensitive     CEFAZOLIN <=4 SENSITIVE Sensitive     CEFTRIAXONE <=1 SENSITIVE Sensitive     CIPROFLOXACIN >=4 RESISTANT Resistant     GENTAMICIN >=16 RESISTANT Resistant     IMIPENEM <=0.25 SENSITIVE Sensitive     NITROFURANTOIN <=16 SENSITIVE Sensitive     TRIMETH/SULFA <=20 SENSITIVE Sensitive     AMPICILLIN/SULBACTAM 4 SENSITIVE Sensitive     PIP/TAZO <=4 SENSITIVE Sensitive     * 5,000 COLONIES/mL ESCHERICHIA COLI  SARS CORONAVIRUS 2 (TAT 6-24 HRS) Nasopharyngeal Nasopharyngeal Swab     Status: None   Collection Time: 02/10/20  3:12 AM   Specimen: Nasopharyngeal Swab  Result Value Ref Range Status   SARS Coronavirus 2 NEGATIVE NEGATIVE Final    Comment: (NOTE) SARS-CoV-2 target nucleic acids are NOT DETECTED. The SARS-CoV-2 RNA is generally detectable in upper and lower respiratory specimens during the acute phase of infection. Negative results do not preclude SARS-CoV-2 infection, do not rule out co-infections with other pathogens, and should not be used as the sole basis for treatment or other patient management decisions. Negative results must be combined with clinical observations, patient history, and epidemiological information. The expected result is Negative. Fact Sheet for Patients: SugarRoll.be Fact Sheet for Healthcare Providers: https://www.woods-mathews.com/ This test is not yet approved or cleared by the [place] FDA and  has been  authorized for detection and/or diagnosis of SARS-CoV-2 by FDA under an Emergency Use Authorization (EUA). This EUA will remain  in effect (meaning this test can be used) for the duration of the COVID-19 declaration under Section  56 4(b)(1) of the Act, 21 U.S.C. section 360bbb-3(b)(1), unless the authorization is terminated or revoked sooner. Performed at [facility], [place] [address] [place], [place] [address]   MRSA PCR Screening     Status: None   Collection Time: 02/10/20  3:32 PM   Specimen: Nasal Mucosa; Nasopharyngeal  Result Value Ref Range Status   MRSA by PCR NEGATIVE NEGATIVE Final    Comment:        The GeneXpert MRSA Assay (FDA approved for NASAL specimens only), is one component of a comprehensive MRSA colonization surveillance program. It is not intended to diagnose MRSA infection nor to guide or monitor treatment for MRSA infections. Performed at [facility], [place] [address], [place], [place] [address]   Body fluid culture (includes gram stain)     Status: None   Collection Time: 02/10/20  6:23 PM   Specimen: Pleural Fluid  Result Value Ref Range Status   Specimen Description   Final    PLEURAL LEFT Performed at [facility] [address] [place] [address], [place], [place] [address]    Special Requests   Final    Normal Performed at [facility], [place] [address] [place], [place] [address]    Gram Stain   Final    FEW WBC PRESENT, PREDOMINANTLY MONONUCLEAR NO ORGANISMS SEEN    Culture   Final    NO GROWTH 3 DAYS Performed at [facility] [address], [place], [place] [address]    Report Status 02/14/2020 FINAL  Final  Culture, blood (Routine X 2) w Reflex to ID Panel     Status: None (Preliminary result)   Collection Time: 02/13/20  1:16 PM   Specimen: Left Antecubital; Blood  Result Value Ref Range Status   Specimen Description   Final    LEFT ANTECUBITAL Performed at [facility] [address], [place], [place] [address]    Special Requests   Final    BOTTLES DRAWN AEROBIC ONLY Blood Culture adequate volume Performed at [facility] [address] [place], [place] [address]    Culture   Final    NO GROWTH 4 DAYS Performed at [facility], [place] [address], [place], [place] [address]    Report Status PENDING  Incomplete  Culture, blood (Routine X 2) w Reflex to ID Panel     Status: None (Preliminary result)   Collection Time: 02/13/20  1:19 PM   Specimen: BLOOD LEFT HAND  Result Value Ref Range Status   Specimen Description   Final    BLOOD LEFT HAND Performed at [facility] [address], [place], [place] [address]    Special Requests   Final    BOTTLES DRAWN AEROBIC ONLY Blood Culture adequate volume Performed at [facility] [address] [place] [address], [place], [place] [address]    Culture   Final    NO GROWTH 4 DAYS Performed at [facility], [place] [address] [place],  [address]    Report Status PENDING  Incomplete         Radiology Studies: MR Brain W Wo Contrast  Result Date: 02/15/2020 CLINICAL DATA:  Non-small cell lung cancer.  Staging. EXAM: MRI HEAD WITHOUT AND WITH CONTRAST TECHNIQUE: Multiplanar, multiecho pulse sequences of the brain and surrounding structures were obtained without and with intravenous contrast. CONTRAST:  53m GADAVIST GADOBUTROL 1 MMOL/ML IV SOLN COMPARISON:  04/30/2013 FINDINGS: Brain: Diffusion  imaging does not show any acute or subacute infarction. No abnormality affects the brainstem or cerebellum. Cerebral hemispheres show minimal small vessel change of the white matter, less than usually seen at this age. No cortical or large vessel territory infarction. No mass lesion, hemorrhage, hydrocephalus or extra-axial collection. Incidental venous angioma left frontal lobe. Vascular: Major vessels at the base of the brain show flow. Skull and upper  cervical spine: No evidence of metastatic disease. 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 Sinuses/Orbits: Clear/normal Other: None IMPRESSION: No evidence of metastatic disease. Minimal small vessel change of the white matter, less than often seen at this age. Left parietal calvarial hemangioma, unchanged since 2014. No evidence of skull or skull base metastatic disease. Electronically Signed   By: Nelson Chimes M.D.   On: 02/15/2020 14:30        Scheduled Meds: . amiodarone  200 mg Oral Daily  . amLODipine  5 mg Oral Daily  . brimonidine  1 drop Right Eye BID   And  . timolol  1 drop Right Eye BID  . Chlorhexidine Gluconate Cloth  6 each Topical Daily  . cholecalciferol  2,000 Units Oral Daily  . clopidogrel  75 mg Oral Daily  . cycloSPORINE  1 drop Both Eyes BID  . escitalopram  10 mg Oral Daily  . famotidine  20 mg Oral Daily  . feeding supplement  1 Container Oral BID BM  . feeding supplement (ENSURE ENLIVE)  237 mL Oral BID BM  . feeding supplement (PRO-STAT SUGAR FREE 64)  30 mL Oral BID  . furosemide  40 mg Oral BID  . letrozole  2.5 mg Oral Daily  . lisinopril  10 mg Oral Daily  . mouth rinse  15 mL Mouth Rinse BID  . multivitamin with minerals  1 tablet Oral Daily  . osimertinib mesylate  80 mg Oral Daily  . pantoprazole  40 mg Oral QAC breakfast  . psyllium  1 packet Oral Daily  . Rivaroxaban  15 mg Oral BID WC   Followed by  . [START ON 03/03/2020] rivaroxaban  20 mg Oral Q supper   Continuous Infusions: . sodium chloride Stopped (02/11/20 0020)     LOS: 7 days    Time spent: 35 minutes spent on chart review, discussion with nursing staff, consultants, updating family and interview/physical exam; more than 50% of that time was spent in counseling and/or coordination of care.    Castulo Scarpelli J [place] DO [facility] Available via Epic secure chat 7am-7pm After these hours, please refer to coverage provider listed on [email or web address] 02/17/2020, 1:14 PM

## 2020-02-17 NOTE — Care Management Important Message (Signed)
Important Message  Patient Details IM Letter given to Richelle Ito SW Case Manager to present to the Patient Name: Kristin [name] MRN: [id number] Date of Birth: 10/21/1941   Medicare Important Message Given:  Yes     Kerin Salen 02/17/2020, 11:49 AM

## 2020-02-17 NOTE — Progress Notes (Addendum)
HEMATOLOGY-ONCOLOGY PROGRESS NOTE  SUBJECTIVE: The patient was moved out of the ICU last evening.  Daughter is at the bedside this morning.  Respiratory status stable but she is having some increased pain at the prior thoracentesis site.  Remains afebrile.  Work with PT some last evening.  REVIEW OF SYSTEMS:   Constitutional: Reports ongoing generalized weakness, no fevers or chills Respiratory: Shortness of breath improved Cardiovascular: Reports pain at the prior thoracentesis site Gastrointestinal:  Denies nausea, heartburn or change in bowel habits Skin: Denies abnormal skin rashes Lymphatics: Denies new lymphadenopathy or easy bruising Neurological:Denies numbness, tingling or new weaknesses Behavioral/Psych: Mood is stable, no new changes  Extremities: No lower extremity edema All other systems were reviewed with the patient and are negative.  I have reviewed the past medical history, past surgical history, social history and family history with the patient and they are unchanged from previous note.   PHYSICAL EXAMINATION: ECOG PERFORMANCE STATUS: 2 - Symptomatic, <50% confined to bed  Vitals:   02/17/20 0721 02/17/20 1219  BP: 124/73 (!) 95/45  Pulse: 76 68  Resp: 18 18  Temp: 97.8 F (36.6 C) 98.1 F (36.7 C)  SpO2: 92% 91%   Filed Weights   02/15/20 0457 02/16/20 0408 02/17/20 0434  Weight: 60 kg 59.1 kg 59.5 kg    Intake/Output from previous day: 04/28 0701 - 04/29 0700 In: -  Out: 1200 [Urine:600; Stool:600]  GENERAL: Chronically ill-appearing female LUNGS: Diminished breath sounds at bases, left greater than right HEART: Tachycardic, no lower extremity edema ABDOMEN:abdomen soft, non-tender and normal bowel sounds Musculoskeletal:no cyanosis of digits and no clubbing  NEURO: alert & oriented x 3 with fluent speech, no focal motor/sensory deficits  LABORATORY DATA:  I have reviewed the data as listed CMP Latest Ref Rng & Units 02/17/2020 02/16/2020  02/15/2020  Glucose 70 - 99 mg/dL 106(H) 121(H) 109(H)  BUN 8 - 23 mg/dL 40(H) 42(H) 37(H)  Creatinine 0.44 - 1.00 mg/dL 1.00 1.10(H) 1.05(H)  Sodium 135 - 145 mmol/L 138 139 138  Potassium 3.5 - 5.1 mmol/L 4.5 4.2 3.4(L)  Chloride 98 - 111 mmol/L 105 107 105  CO2 22 - 32 mmol/L '24 24 25  '$ Calcium 8.9 - 10.3 mg/dL 8.8(L) 8.6(L) 8.6(L)  Total Protein 6.5 - 8.1 g/dL - - -  Total Bilirubin 0.3 - 1.2 mg/dL - - -  Alkaline Phos 38 - 126 U/L - - -  AST 15 - 41 U/L - - -  ALT 0 - 44 U/L - - -    Lab Results  Component Value Date   WBC 13.4 (H) 02/17/2020   HGB 8.9 (L) 02/17/2020   HCT 28.3 (L) 02/17/2020   MCV 91.3 02/17/2020   PLT 263 02/17/2020   NEUTROABS 9.6 (H) 02/09/2020    CT ANGIO CHEST PE W OR WO CONTRAST  Result Date: 02/09/2020 CLINICAL DATA:  Shortness of breath, weakness, left upper lobe lung cancer EXAM: CT ANGIOGRAPHY CHEST WITH CONTRAST TECHNIQUE: Multidetector CT imaging of the chest was performed using the standard protocol during bolus administration of intravenous contrast. Multiplanar CT image reconstructions and MIPs were obtained to evaluate the vascular anatomy. CONTRAST:  57m OMNIPAQUE IOHEXOL 350 MG/ML SOLN COMPARISON:  12/24/2019 FINDINGS: Cardiovascular: This is a technically adequate evaluation of the pulmonary vasculature. There are segmental right lower lobe pulmonary emboli. Clot burden is minimal. No evidence of right heart strain. No pericardial effusion. Thoracic aorta is normal in caliber with no aneurysm or dissection. Mediastinum/Nodes: No pathologic adenopathy.  There is a large hiatal hernia. Esophagus, trachea, and thyroid are stable. Lungs/Pleura: Since the previous exam, there has been significant enlargement of the left pleural effusion now measuring greater than 1 L in volume. The effusion is partially loculated, with continued areas of pleural thickening. There is dense left lower lobe consolidation consistent with atelectasis. There is a trace right  pleural effusion. Patchy hypoventilatory changes are seen at the right lung base. No pneumothorax. Central airways patent. Upper Abdomen: Stable right lobe liver cyst. Remainder of the upper abdomen is unremarkable. Musculoskeletal: No acute or destructive bony lesions. Reconstructed images demonstrate no additional findings. Review of the MIP images confirms the above findings. IMPRESSION: 1. Right lower lobe segmental pulmonary emboli. Clot burden is minimal. 2. Significant interval enlargement of the partially loculated left pleural effusion, now measuring greater than 1 L in volume. Continued areas of pleural thickening suspicious for recurrent disease. 3. Trace right pleural effusion. 4. Hiatal hernia. These results were called by telephone at the time of interpretation on 02/09/2020 at 11:40 pm to provider [facility] , who verbally acknowledged these results. Electronically Signed   By: Randa Ngo M.D.   On: 02/09/2020 23:40   MR Brain W Wo Contrast  Result Date: 02/15/2020 CLINICAL DATA:  Non-small cell lung cancer.  Staging. EXAM: MRI HEAD WITHOUT AND WITH CONTRAST TECHNIQUE: Multiplanar, multiecho pulse sequences of the brain and surrounding structures were obtained without and with intravenous contrast. CONTRAST:  36m GADAVIST GADOBUTROL 1 MMOL/ML IV SOLN COMPARISON:  04/30/2013 FINDINGS: Brain: Diffusion imaging does not show any acute or subacute infarction. No abnormality affects the brainstem or cerebellum. Cerebral hemispheres show minimal small vessel change of the white matter, less than usually seen at this age. No cortical or large vessel territory infarction. No mass lesion, hemorrhage, hydrocephalus or extra-axial collection. Incidental venous angioma left frontal lobe. Vascular: Major vessels at the base of the brain show flow. Skull and upper cervical spine: No evidence of metastatic disease. 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 Sinuses/Orbits: Clear/normal  Other: None IMPRESSION: No evidence of metastatic disease. Minimal small vessel change of the white matter, less than often seen at this age. Left parietal calvarial hemangioma, unchanged since 2014. No evidence of skull or skull base metastatic disease. Electronically Signed   By: MNelson ChimesM.D.   On: 02/15/2020 14:30   DG Chest Port 1 View  Result Date: 02/15/2020 CLINICAL DATA:  Pleural effusions. EXAM: PORTABLE CHEST 1 VIEW COMPARISON:  Chest x-ray 02/12/2020, 02/11/2020. FINDINGS: Cardiomegaly. Diffuse bilateral pulmonary infiltrates/edema, progressed from prior exam. CHF could present this fashion. Bilateral pneumonia cannot be excluded. Bilateral pleural effusions again noted. No pneumothorax. Surgical sutures noted over the left upper chest. Surgical clips noted over the right chest. No acute bony abnormality identified. IMPRESSION: Cardiomegaly. Diffuse progressive bilateral pulmonary infiltrates/edema. Bilateral pleural effusions again noted. Electronically Signed   By: TMarcello Moores Register   On: 02/15/2020 09:05   DG CHEST PORT 1 VIEW  Result Date: 02/12/2020 CLINICAL DATA:  Shortness of breath EXAM: PORTABLE CHEST 1 VIEW COMPARISON:  02/11/2020 and 02/10/2020 FINDINGS: Cardiomediastinal contours remain enlarged. Bilateral effusions similar to prior study with persistent LEFT lower lobe airspace disease and obscured LEFT hemidiaphragm. Increased interstitial markings persist. No acute bone process. IMPRESSION: No interval change in appearance of the chest since prior study. Bilateral effusions with LEFT lower lobe airspace disease. Persistent increased interstitial markings cardiomegaly. Electronically Signed   By: GZetta BillsM.D.   On: 02/12/2020 18:18   DG  Chest Port 1 View  Result Date: 02/11/2020 CLINICAL DATA:  Respiratory failure EXAM: PORTABLE CHEST 1 VIEW COMPARISON:  02/10/2020 FINDINGS: Cardiomegaly with vascular congestion. Small left pleural effusion. Left lower lobe  atelectasis. Bilateral interstitial prominence may reflect mild interstitial edema. IMPRESSION: Cardiomegaly, suspect mild interstitial edema. Small left effusion with left base atelectasis. Electronically Signed   By: Rolm Baptise M.D.   On: 02/11/2020 02:22   DG CHEST PORT 1 VIEW  Result Date: 02/10/2020 CLINICAL DATA:  Post left thoracentesis EXAM: PORTABLE CHEST 1 VIEW COMPARISON:  02/09/2020 FINDINGS: Interval decrease in the left effusion following thoracentesis. Small left effusion remains. No pneumothorax. Left base atelectasis. Mild cardiomegaly. Right lung clear. IMPRESSION: Decreasing left effusion following thoracentesis.  No pneumothorax. Small left effusion and left base atelectasis. Electronically Signed   By: Rolm Baptise M.D.   On: 02/10/2020 19:13   DG Chest Port 1 View  Result Date: 02/09/2020 CLINICAL DATA:  Short of breath EXAM: PORTABLE CHEST 1 VIEW COMPARISON:  06/24/2018, CT 12/24/2019 FINDINGS: Postoperative changes in the left upper lung. Moderate left pleural effusion increased compared to prior. Probable trace right effusion. Airspace disease at the left base. Obscured cardiomediastinal silhouette. Hiatal hernia. Convex opacity over the lower mediastinal silhouette. IMPRESSION: 1. Postsurgical changes of the left chest. Moderate left pleural effusion, increased compared to prior. Probable trace right pleural effusion. Airspace disease at the left lung base. Electronically Signed   By: Donavan Foil M.D.   On: 02/09/2020 18:07   ECHOCARDIOGRAM COMPLETE  Result Date: 02/10/2020    ECHOCARDIOGRAM REPORT   Patient Name:   Kristin [name] Date of Exam: 02/10/2020 Medical Rec #:  [id number]       Height:       66.0 in Accession #:    [id number]      Weight:       127.9 lb Date of Birth:  Jan 21, 1941       BSA:          1.654 m Patient Age:    75 years        BP:           111/58 mmHg Patient Gender: F               HR:           88 bpm. Exam Location:  Inpatient Procedure: 2D Echo and  Intracardiac Opacification Agent Indications:    Pulmonary Embolus 415.19 / I26.99  History:        Patient has prior history of Echocardiogram examinations, most                 recent 02/01/2016. Risk Factors:Hypertension. CKD                 Pleural effusion.  Sonographer:    Vikki Ports Turrentine Referring Phys: 49 Rise Patience  Sonographer Comments: Technically difficult study due to poor echo windows. Image acquisition challenging due to patient body habitus. IMPRESSIONS  1. Left ventricular ejection fraction, by estimation, is 50 to 55%. The left ventricle has low normal function. The left ventricle has no regional wall motion abnormalities. Left ventricular diastolic parameters are consistent with Grade I diastolic dysfunction (impaired relaxation).  2. Right ventricular systolic function is moderately reduced. The right ventricular size is moderately enlarged. D-shaped septum suggestive of RV pressure/volume overload. No complete TR doppler jet so unable to estimate PA systolic pressure.  3. The aortic valve is tricuspid. Aortic valve regurgitation is trivial. No aortic  stenosis is present.  4. The mitral valve is normal in structure. No evidence of mitral valve regurgitation. No evidence of mitral stenosis.  5. The inferior vena cava is dilated in size with <50% respiratory variability, suggesting right atrial pressure of 15 mmHg.  6. Left-sided pleural effusion noted. Trivial pericardial effusion. FINDINGS  Left Ventricle: Left ventricular ejection fraction, by estimation, is 50 to 55%. The left ventricle has low normal function. The left ventricle has no regional wall motion abnormalities. Definity contrast agent was given IV to delineate the left ventricular endocardial borders. The left ventricular internal cavity size was normal in size. There is no left ventricular hypertrophy. Left ventricular diastolic parameters are consistent with Grade I diastolic dysfunction (impaired relaxation). Right  Ventricle: The right ventricular size is moderately enlarged. No increase in right ventricular wall thickness. Right ventricular systolic function is moderately reduced. Tricuspid regurgitation signal is inadequate for assessing PA pressure. Left Atrium: Left atrial size was normal in size. Right Atrium: Right atrial size was normal in size. Pericardium: Trivial pericardial effusion is present. Mitral Valve: The mitral valve is normal in structure. No evidence of mitral valve regurgitation. No evidence of mitral valve stenosis. Tricuspid Valve: The tricuspid valve is normal in structure. Tricuspid valve regurgitation is not demonstrated. Aortic Valve: The aortic valve is tricuspid. Aortic valve regurgitation is trivial. No aortic stenosis is present. Pulmonic Valve: The pulmonic valve was normal in structure. Pulmonic valve regurgitation is not visualized. Aorta: The aortic root is normal in size and structure. Venous: The inferior vena cava is dilated in size with less than 50% respiratory variability, suggesting right atrial pressure of 15 mmHg. IAS/Shunts: No atrial level shunt detected by color flow Doppler.  LEFT VENTRICLE PLAX 2D LVOT diam:     1.80 cm  Diastology LV SV:         36       LV e' lateral:   9.36 cm/s LV SV Index:   22       LV E/e' lateral: 3.6 LVOT Area:     2.54 cm LV e' medial:    7.83 cm/s                         LV E/e' medial:  4.3  LEFT ATRIUM           Index LA Vol (A4C): 36.2 ml 21.89 ml/m  AORTIC VALVE LVOT Vmax:   84.90 cm/s LVOT Vmean:  61.100 cm/s LVOT VTI:    0.141 m MITRAL VALVE MV Area (PHT): 3.37 cm    SHUNTS MV Decel Time: 225 msec    Systemic VTI:  0.14 m MV E velocity: 33.40 cm/s  Systemic Diam: 1.80 cm MV A velocity: 59.70 cm/s MV E/A ratio:  0.56 Loralie Champagne MD Electronically signed by Loralie Champagne MD Signature Date/Time: 02/10/2020/4:49:30 PM    Final    CT RENAL STONE STUDY  Result Date: 02/09/2020 CLINICAL DATA:  Flank pain, kidney stone suspected. EXAM: CT  ABDOMEN AND PELVIS WITHOUT CONTRAST TECHNIQUE: Multidetector CT imaging of the abdomen and pelvis was performed following the standard protocol without IV contrast. COMPARISON:  Abdominal CT 12/24/2019. Concurrent chest CTA, reported separately. FINDINGS: Lower chest: Left pleural effusion and basilar consolidation assessed on concurrent chest CT, reported separately. Moderately large hiatal hernia, partially obscured by motion artifact. Hepatobiliary: Cyst in the right lobe of the liver is grossly stable, motion artifact partially obscures evaluation. There is some high-density material within the gallbladder  that may represent sludge. More detailed evaluation is limited given motion. No biliary dilatation. Pancreas: Motion artifact through the pancreas limits assessment. No ductal dilatation. No obvious peripancreatic inflammation Spleen: Normal in size with small cleft medially. Adrenals/Urinary Tract: No adrenal nodule. No hydronephrosis. No renal or ureteral calculi. Mild bilateral renal parenchymal thinning and cortical scarring in the lower right kidney. There is symmetric bilateral perinephric edema. Urinary bladder is unremarkable, no bladder stone. Stomach/Bowel: Moderately large hiatal hernia. Motion limits more detailed assessment. No evidence of bowel obstruction. No obvious bowel inflammation, lack of contrast and motion limits detailed bowel assessment. Descending colostomy. Mild descending colonic diverticulosis without diverticulitis chain sutures noted in the cecum. Vascular/Lymphatic: Aorto bi-iliac atherosclerosis. No grossly enlarged abdominopelvic lymph nodes, detailed assessment limited given motion and lack of contrast. Reproductive: Calcified uterine fibroids. No adnexal mass. Other: Mild generalized body wall edema. No ascites. No free air. Musculoskeletal: Left hip arthroplasty. Bones are under mineralized. Stable bone island in the mid sacrum. Multilevel degenerative change in the lumbar  spine. IMPRESSION: 1. Motion limited exam. 2. No renal stones or obstructive uropathy. Symmetric bilateral perinephric edema is nonspecific. 3. Moderately large hiatal hernia. 4. High-density material within the gallbladder may represent sludge. More detailed evaluation is limited given motion artifact. Aortic Atherosclerosis (ICD10-I70.0). Electronically Signed   By: Keith Rake M.D.   On: 02/09/2020 23:30   VAS [place] LOWER EXTREMITY VENOUS (DVT)  Result Date: 02/11/2020  Lower Venous DVTStudy Indications: Pulmonary embolism.  Performing Technologist: June Leap RDMS, RVT  Examination Guidelines: A complete evaluation includes B-mode imaging, spectral Doppler, color Doppler, and power Doppler as needed of all accessible portions of each vessel. Bilateral testing is considered an integral part of a complete examination. Limited examinations for reoccurring indications may be performed as noted. The reflux portion of the exam is performed with the patient in reverse Trendelenburg.  +---------+---------------+---------+-----------+----------+--------------+ RIGHT    CompressibilityPhasicitySpontaneityPropertiesThrombus Aging +---------+---------------+---------+-----------+----------+--------------+ CFV      Full           Yes      Yes                                 +---------+---------------+---------+-----------+----------+--------------+ SFJ      Full                                                        +---------+---------------+---------+-----------+----------+--------------+ FV Prox  Full                                                        +---------+---------------+---------+-----------+----------+--------------+ FV Mid   Full                                                        +---------+---------------+---------+-----------+----------+--------------+ FV DistalFull                                                         +---------+---------------+---------+-----------+----------+--------------+  PFV      Full                                                        +---------+---------------+---------+-----------+----------+--------------+ POP      Full           Yes      Yes                                 +---------+---------------+---------+-----------+----------+--------------+ PTV      Full                                                        +---------+---------------+---------+-----------+----------+--------------+ PERO     Full                                                        +---------+---------------+---------+-----------+----------+--------------+   +---------+---------------+---------+-----------+----------+--------------+ LEFT     CompressibilityPhasicitySpontaneityPropertiesThrombus Aging +---------+---------------+---------+-----------+----------+--------------+ CFV      Full           Yes      Yes                                 +---------+---------------+---------+-----------+----------+--------------+ SFJ      Full                                                        +---------+---------------+---------+-----------+----------+--------------+ FV Prox  Full                                                        +---------+---------------+---------+-----------+----------+--------------+ FV Mid   Full                                                        +---------+---------------+---------+-----------+----------+--------------+ FV DistalFull                                                        +---------+---------------+---------+-----------+----------+--------------+ PFV      Full                                                        +---------+---------------+---------+-----------+----------+--------------+  POP      Full           Yes      Yes                                  +---------+---------------+---------+-----------+----------+--------------+ PTV      None                                                        +---------+---------------+---------+-----------+----------+--------------+ PERO     None                                                        +---------+---------------+---------+-----------+----------+--------------+     Summary: RIGHT: - There is no evidence of deep vein thrombosis in the lower extremity.  - No cystic structure found in the popliteal fossa.  LEFT: - Findings consistent with acute deep vein thrombosis involving the left posterior tibial veins, and left peroneal veins. - No cystic structure found in the popliteal fossa.  *See table(s) above for measurements and observations. Electronically signed by Harold Barban MD on 02/11/2020 at 5:06:34 PM.    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  The patient is currently receiving Tagrisso 80 mg daily and is status post 26 months of treatment.  She has been tolerating her treatment well but recently started having increasing fatigue and weakness as well as nausea, anorexia, and dehydration.  Her last CT scan in March 2021 showed some evidence concerning for disease progression.  At her most recent office visit, it was recommended for her to have a PET scan as well as an MRI of the brain to further evaluate her disease.  These have not yet been performed.  For her metastatic lung cancer, continue Tagrisso.  Will await results of cytology and if positive for malignant cells, will send for molecular studies.  I have ordered her MRI of the brain with and without contrast for restaging purposes.  Her PET  scan is currently scheduled for tomorrow and this will need to be canceled and rescheduled for when she is discharged from the hospital.  For her new pulmonary emboli, continue Xarelto.  For her history of breast cancer, continue letrozole.   LOS: 7 days   Mikey Bussing, DNP, AGPCNP-BC, AOCNP 02/17/20   ADDENDUM: Cytology results from thoracentesis on 02/10/2020 resulted showing malignant cells consistent with primary lung adenocarcinoma.  We will request for pathology to send for molecular studies. HEMATOLOGY-ONCOLOGY PROGRESS NOTE  SUBJECTIVE: Husband is at the bedside this morning.  Reports some improvement in her breathing.  Had a fever up to 100.8 yesterday afternoon.  Remains on IV antibiotics.  Cytology results from thoracentesis are still pending this morning.  She has no other complaints this morning.  REVIEW OF SYSTEMS:   Constitutional: Reports ongoing generalized weakness, still with fevers  intermittently Respiratory: Still has some shortness of breath which has improved somewhat Cardiovascular: Denies palpitation, chest discomfort Gastrointestinal:  Denies nausea, heartburn or change in bowel habits Skin: Denies abnormal skin rashes Lymphatics: Denies new lymphadenopathy or easy bruising Neurological:Denies numbness, tingling or new weaknesses Behavioral/Psych: Mood is stable, no new changes  Extremities: No lower extremity edema All other systems were reviewed with the patient and are negative.  I have reviewed the past medical history, past surgical history, social history and family history with the patient and they are unchanged from previous note.   PHYSICAL EXAMINATION: ECOG PERFORMANCE STATUS: 2 - Symptomatic, <50% confined to bed  Vitals:   02/17/20 0721 02/17/20 1219  BP: 124/73 (!) 95/45  Pulse: 76 68  Resp: 18 18  Temp: 97.8 F (36.6 C) 98.1 F (36.7 C)  SpO2: 92% 91%   Filed Weights   02/15/20 0457 02/16/20 0408 02/17/20 0434  Weight: 60 kg 59.1  kg 59.5 kg    Intake/Output from previous day: 04/28 0701 - 04/29 0700 In: -  Out: 1200 [Urine:600; Stool:600]  GENERAL: Chronically ill-appearing female LUNGS: Diminished breath sounds at bases, left greater than right HEART: Tachycardic, no lower extremity edema ABDOMEN:abdomen soft, non-tender and normal bowel sounds Musculoskeletal:no cyanosis of digits and no clubbing  NEURO: alert & oriented x 3 with fluent speech, no focal motor/sensory deficits  LABORATORY DATA:  I have reviewed the data as listed CMP Latest Ref Rng & Units 02/17/2020 02/16/2020 02/15/2020  Glucose 70 - 99 mg/dL 106(H) 121(H) 109(H)  BUN 8 - 23 mg/dL 40(H) 42(H) 37(H)  Creatinine 0.44 - 1.00 mg/dL 1.00 1.10(H) 1.05(H)  Sodium 135 - 145 mmol/L 138 139 138  Potassium 3.5 - 5.1 mmol/L 4.5 4.2 3.4(L)  Chloride 98 - 111 mmol/L 105 107 105  CO2 22 - 32 mmol/L '24 24 25  '$ Calcium 8.9 - 10.3 mg/dL 8.8(L) 8.6(L) 8.6(L)  Total Protein 6.5 - 8.1 g/dL - - -  Total Bilirubin 0.3 - 1.2 mg/dL - - -  Alkaline Phos 38 - 126 U/L - - -  AST 15 - 41 U/L - - -  ALT 0 - 44 U/L - - -    Lab Results  Component Value Date   WBC 13.4 (H) 02/17/2020   HGB 8.9 (L) 02/17/2020   HCT 28.3 (L) 02/17/2020   MCV 91.3 02/17/2020   PLT 263 02/17/2020   NEUTROABS 9.6 (H) 02/09/2020    CT ANGIO CHEST PE W OR WO CONTRAST  Result Date: 02/09/2020 CLINICAL DATA:  Shortness of breath, weakness, left upper lobe lung cancer EXAM: CT ANGIOGRAPHY CHEST WITH CONTRAST TECHNIQUE: Multidetector CT imaging of the chest was performed using the standard protocol during bolus administration of intravenous contrast. Multiplanar CT image reconstructions and MIPs were obtained to evaluate the vascular anatomy. CONTRAST:  71m OMNIPAQUE IOHEXOL 350 MG/ML SOLN COMPARISON:  12/24/2019 FINDINGS: Cardiovascular: This is a technically adequate evaluation of the pulmonary vasculature. There are segmental right lower lobe pulmonary emboli. Clot burden is minimal. No  evidence of right heart strain. No pericardial effusion. Thoracic aorta is normal in caliber with no aneurysm or dissection. Mediastinum/Nodes: No pathologic adenopathy. There is a large hiatal hernia. Esophagus, trachea, and thyroid are stable. Lungs/Pleura: Since the previous exam, there has been significant enlargement of the left pleural effusion now measuring greater than 1 L in volume. The effusion is partially loculated, with continued areas of pleural thickening. There is dense left lower lobe consolidation consistent with atelectasis. There is a  trace right pleural effusion. Patchy hypoventilatory changes are seen at the right lung base. No pneumothorax. Central airways patent. Upper Abdomen: Stable right lobe liver cyst. Remainder of the upper abdomen is unremarkable. Musculoskeletal: No acute or destructive bony lesions. Reconstructed images demonstrate no additional findings. Review of the MIP images confirms the above findings. IMPRESSION: 1. Right lower lobe segmental pulmonary emboli. Clot burden is minimal. 2. Significant interval enlargement of the partially loculated left pleural effusion, now measuring greater than 1 L in volume. Continued areas of pleural thickening suspicious for recurrent disease. 3. Trace right pleural effusion. 4. Hiatal hernia. These results were called by telephone at the time of interpretation on 02/09/2020 at 11:40 pm to provider [facility] , who verbally acknowledged these results. Electronically Signed   By: Randa Ngo M.D.   On: 02/09/2020 23:40   MR Brain W Wo Contrast  Result Date: 02/15/2020 CLINICAL DATA:  Non-small cell lung cancer.  Staging. EXAM: MRI HEAD WITHOUT AND WITH CONTRAST TECHNIQUE: Multiplanar, multiecho pulse sequences of the brain and surrounding structures were obtained without and with intravenous contrast. CONTRAST:  28m GADAVIST GADOBUTROL 1 MMOL/ML IV SOLN COMPARISON:  04/30/2013 FINDINGS: Brain: Diffusion imaging does not show any  acute or subacute infarction. No abnormality affects the brainstem or cerebellum. Cerebral hemispheres show minimal small vessel change of the white matter, less than usually seen at this age. No cortical or large vessel territory infarction. No mass lesion, hemorrhage, hydrocephalus or extra-axial collection. Incidental venous angioma left frontal lobe. Vascular: Major vessels at the base of the brain show flow. Skull and upper cervical spine: No evidence of metastatic disease. 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 Sinuses/Orbits: Clear/normal Other: None IMPRESSION: No evidence of metastatic disease. Minimal small vessel change of the white matter, less than often seen at this age. Left parietal calvarial hemangioma, unchanged since 2014. No evidence of skull or skull base metastatic disease. Electronically Signed   By: MNelson ChimesM.D.   On: 02/15/2020 14:30   DG Chest Port 1 View  Result Date: 02/15/2020 CLINICAL DATA:  Pleural effusions. EXAM: PORTABLE CHEST 1 VIEW COMPARISON:  Chest x-ray 02/12/2020, 02/11/2020. FINDINGS: Cardiomegaly. Diffuse bilateral pulmonary infiltrates/edema, progressed from prior exam. CHF could present this fashion. Bilateral pneumonia cannot be excluded. Bilateral pleural effusions again noted. No pneumothorax. Surgical sutures noted over the left upper chest. Surgical clips noted over the right chest. No acute bony abnormality identified. IMPRESSION: Cardiomegaly. Diffuse progressive bilateral pulmonary infiltrates/edema. Bilateral pleural effusions again noted. Electronically Signed   By: TMarcello Moores Register   On: 02/15/2020 09:05   DG CHEST PORT 1 VIEW  Result Date: 02/12/2020 CLINICAL DATA:  Shortness of breath EXAM: PORTABLE CHEST 1 VIEW COMPARISON:  02/11/2020 and 02/10/2020 FINDINGS: Cardiomediastinal contours remain enlarged. Bilateral effusions similar to prior study with persistent LEFT lower lobe airspace disease and obscured LEFT hemidiaphragm.  Increased interstitial markings persist. No acute bone process. IMPRESSION: No interval change in appearance of the chest since prior study. Bilateral effusions with LEFT lower lobe airspace disease. Persistent increased interstitial markings cardiomegaly. Electronically Signed   By: GZetta BillsM.D.   On: 02/12/2020 18:18   DG Chest Port 1 View  Result Date: 02/11/2020 CLINICAL DATA:  Respiratory failure EXAM: PORTABLE CHEST 1 VIEW COMPARISON:  02/10/2020 FINDINGS: Cardiomegaly with vascular congestion. Small left pleural effusion. Left lower lobe atelectasis. Bilateral interstitial prominence may reflect mild interstitial edema. IMPRESSION: Cardiomegaly, suspect mild interstitial edema. Small left effusion with left base atelectasis. Electronically Signed  By: Rolm Baptise M.D.   On: 02/11/2020 02:22   DG CHEST PORT 1 VIEW  Result Date: 02/10/2020 CLINICAL DATA:  Post left thoracentesis EXAM: PORTABLE CHEST 1 VIEW COMPARISON:  02/09/2020 FINDINGS: Interval decrease in the left effusion following thoracentesis. Small left effusion remains. No pneumothorax. Left base atelectasis. Mild cardiomegaly. Right lung clear. IMPRESSION: Decreasing left effusion following thoracentesis.  No pneumothorax. Small left effusion and left base atelectasis. Electronically Signed   By: Rolm Baptise M.D.   On: 02/10/2020 19:13   DG Chest Port 1 View  Result Date: 02/09/2020 CLINICAL DATA:  Short of breath EXAM: PORTABLE CHEST 1 VIEW COMPARISON:  06/24/2018, CT 12/24/2019 FINDINGS: Postoperative changes in the left upper lung. Moderate left pleural effusion increased compared to prior. Probable trace right effusion. Airspace disease at the left base. Obscured cardiomediastinal silhouette. Hiatal hernia. Convex opacity over the lower mediastinal silhouette. IMPRESSION: 1. Postsurgical changes of the left chest. Moderate left pleural effusion, increased compared to prior. Probable trace right pleural effusion. Airspace  disease at the left lung base. Electronically Signed   By: Donavan Foil M.D.   On: 02/09/2020 18:07   ECHOCARDIOGRAM COMPLETE  Result Date: 02/10/2020    ECHOCARDIOGRAM REPORT   Patient Name:   Kristin [name] Date of Exam: 02/10/2020 Medical Rec #:  [id number]       Height:       66.0 in Accession #:    [id number]      Weight:       127.9 lb Date of Birth:  02/09/1941       BSA:          1.654 m Patient Age:    76 years        BP:           111/58 mmHg Patient Gender: F               HR:           88 bpm. Exam Location:  Inpatient Procedure: 2D Echo and Intracardiac Opacification Agent Indications:    Pulmonary Embolus 415.19 / I26.99  History:        Patient has prior history of Echocardiogram examinations, most                 recent 02/01/2016. Risk Factors:Hypertension. CKD                 Pleural effusion.  Sonographer:    Vikki Ports Turrentine Referring Phys: 61 Rise Patience  Sonographer Comments: Technically difficult study due to poor echo windows. Image acquisition challenging due to patient body habitus. IMPRESSIONS  1. Left ventricular ejection fraction, by estimation, is 50 to 55%. The left ventricle has low normal function. The left ventricle has no regional wall motion abnormalities. Left ventricular diastolic parameters are consistent with Grade I diastolic dysfunction (impaired relaxation).  2. Right ventricular systolic function is moderately reduced. The right ventricular size is moderately enlarged. D-shaped septum suggestive of RV pressure/volume overload. No complete TR doppler jet so unable to estimate PA systolic pressure.  3. The aortic valve is tricuspid. Aortic valve regurgitation is trivial. No aortic stenosis is present.  4. The mitral valve is normal in structure. No evidence of mitral valve regurgitation. No evidence of mitral stenosis.  5. The inferior vena cava is dilated in size with <50% respiratory variability, suggesting right atrial pressure of 15 mmHg.  6. Left-sided  pleural effusion noted. Trivial pericardial effusion. FINDINGS  Left Ventricle: Left ventricular  ejection fraction, by estimation, is 50 to 55%. The left ventricle has low normal function. The left ventricle has no regional wall motion abnormalities. Definity contrast agent was given IV to delineate the left ventricular endocardial borders. The left ventricular internal cavity size was normal in size. There is no left ventricular hypertrophy. Left ventricular diastolic parameters are consistent with Grade I diastolic dysfunction (impaired relaxation). Right Ventricle: The right ventricular size is moderately enlarged. No increase in right ventricular wall thickness. Right ventricular systolic function is moderately reduced. Tricuspid regurgitation signal is inadequate for assessing PA pressure. Left Atrium: Left atrial size was normal in size. Right Atrium: Right atrial size was normal in size. Pericardium: Trivial pericardial effusion is present. Mitral Valve: The mitral valve is normal in structure. No evidence of mitral valve regurgitation. No evidence of mitral valve stenosis. Tricuspid Valve: The tricuspid valve is normal in structure. Tricuspid valve regurgitation is not demonstrated. Aortic Valve: The aortic valve is tricuspid. Aortic valve regurgitation is trivial. No aortic stenosis is present. Pulmonic Valve: The pulmonic valve was normal in structure. Pulmonic valve regurgitation is not visualized. Aorta: The aortic root is normal in size and structure. Venous: The inferior vena cava is dilated in size with less than 50% respiratory variability, suggesting right atrial pressure of 15 mmHg. IAS/Shunts: No atrial level shunt detected by color flow Doppler.  LEFT VENTRICLE PLAX 2D LVOT diam:     1.80 cm  Diastology LV SV:         36       LV e' lateral:   9.36 cm/s LV SV Index:   22       LV E/e' lateral: 3.6 LVOT Area:     2.54 cm LV e' medial:    7.83 cm/s                         LV E/e' medial:  4.3   LEFT ATRIUM           Index LA Vol (A4C): 36.2 ml 21.89 ml/m  AORTIC VALVE LVOT Vmax:   84.90 cm/s LVOT Vmean:  61.100 cm/s LVOT VTI:    0.141 m MITRAL VALVE MV Area (PHT): 3.37 cm    SHUNTS MV Decel Time: 225 msec    Systemic VTI:  0.14 m MV E velocity: 33.40 cm/s  Systemic Diam: 1.80 cm MV A velocity: 59.70 cm/s MV E/A ratio:  0.56 Loralie Champagne MD Electronically signed by Loralie Champagne MD Signature Date/Time: 02/10/2020/4:49:30 PM    Final    CT RENAL STONE STUDY  Result Date: 02/09/2020 CLINICAL DATA:  Flank pain, kidney stone suspected. EXAM: CT ABDOMEN AND PELVIS WITHOUT CONTRAST TECHNIQUE: Multidetector CT imaging of the abdomen and pelvis was performed following the standard protocol without IV contrast. COMPARISON:  Abdominal CT 12/24/2019. Concurrent chest CTA, reported separately. FINDINGS: Lower chest: Left pleural effusion and basilar consolidation assessed on concurrent chest CT, reported separately. Moderately large hiatal hernia, partially obscured by motion artifact. Hepatobiliary: Cyst in the right lobe of the liver is grossly stable, motion artifact partially obscures evaluation. There is some high-density material within the gallbladder that may represent sludge. More detailed evaluation is limited given motion. No biliary dilatation. Pancreas: Motion artifact through the pancreas limits assessment. No ductal dilatation. No obvious peripancreatic inflammation Spleen: Normal in size with small cleft medially. Adrenals/Urinary Tract: No adrenal nodule. No hydronephrosis. No renal or ureteral calculi. Mild bilateral renal parenchymal thinning and cortical scarring in the lower  right kidney. There is symmetric bilateral perinephric edema. Urinary bladder is unremarkable, no bladder stone. Stomach/Bowel: Moderately large hiatal hernia. Motion limits more detailed assessment. No evidence of bowel obstruction. No obvious bowel inflammation, lack of contrast and motion limits detailed bowel  assessment. Descending colostomy. Mild descending colonic diverticulosis without diverticulitis chain sutures noted in the cecum. Vascular/Lymphatic: Aorto bi-iliac atherosclerosis. No grossly enlarged abdominopelvic lymph nodes, detailed assessment limited given motion and lack of contrast. Reproductive: Calcified uterine fibroids. No adnexal mass. Other: Mild generalized body wall edema. No ascites. No free air. Musculoskeletal: Left hip arthroplasty. Bones are under mineralized. Stable bone island in the mid sacrum. Multilevel degenerative change in the lumbar spine. IMPRESSION: 1. Motion limited exam. 2. No renal stones or obstructive uropathy. Symmetric bilateral perinephric edema is nonspecific. 3. Moderately large hiatal hernia. 4. High-density material within the gallbladder may represent sludge. More detailed evaluation is limited given motion artifact. Aortic Atherosclerosis (ICD10-I70.0). Electronically Signed   By: Keith Rake M.D.   On: 02/09/2020 23:30   VAS [place] LOWER EXTREMITY VENOUS (DVT)  Result Date: 02/11/2020  Lower Venous DVTStudy Indications: Pulmonary embolism.  Performing Technologist: June Leap RDMS, RVT  Examination Guidelines: A complete evaluation includes B-mode imaging, spectral Doppler, color Doppler, and power Doppler as needed of all accessible portions of each vessel. Bilateral testing is considered an integral part of a complete examination. Limited examinations for reoccurring indications may be performed as noted. The reflux portion of the exam is performed with the patient in reverse Trendelenburg.  +---------+---------------+---------+-----------+----------+--------------+ RIGHT    CompressibilityPhasicitySpontaneityPropertiesThrombus Aging +---------+---------------+---------+-----------+----------+--------------+ CFV      Full           Yes      Yes                                  +---------+---------------+---------+-----------+----------+--------------+ SFJ      Full                                                        +---------+---------------+---------+-----------+----------+--------------+ FV Prox  Full                                                        +---------+---------------+---------+-----------+----------+--------------+ FV Mid   Full                                                        +---------+---------------+---------+-----------+----------+--------------+ FV DistalFull                                                        +---------+---------------+---------+-----------+----------+--------------+ PFV      Full                                                        +---------+---------------+---------+-----------+----------+--------------+  POP      Full           Yes      Yes                                 +---------+---------------+---------+-----------+----------+--------------+ PTV      Full                                                        +---------+---------------+---------+-----------+----------+--------------+ PERO     Full                                                        +---------+---------------+---------+-----------+----------+--------------+   +---------+---------------+---------+-----------+----------+--------------+ LEFT     CompressibilityPhasicitySpontaneityPropertiesThrombus Aging +---------+---------------+---------+-----------+----------+--------------+ CFV      Full           Yes      Yes                                 +---------+---------------+---------+-----------+----------+--------------+ SFJ      Full                                                        +---------+---------------+---------+-----------+----------+--------------+ FV Prox  Full                                                         +---------+---------------+---------+-----------+----------+--------------+ FV Mid   Full                                                        +---------+---------------+---------+-----------+----------+--------------+ FV DistalFull                                                        +---------+---------------+---------+-----------+----------+--------------+ PFV      Full                                                        +---------+---------------+---------+-----------+----------+--------------+ POP      Full           Yes      Yes                                 +---------+---------------+---------+-----------+----------+--------------+  PTV      None                                                        +---------+---------------+---------+-----------+----------+--------------+ PERO     None                                                        +---------+---------------+---------+-----------+----------+--------------+     Summary: RIGHT: - There is no evidence of deep vein thrombosis in the lower extremity.  - No cystic structure found in the popliteal fossa.  LEFT: - Findings consistent with acute deep vein thrombosis involving the left posterior tibial veins, and left peroneal veins. - No cystic structure found in the popliteal fossa.  *See table(s) above for measurements and observations. Electronically signed by Harold Barban MD on 02/11/2020 at 5:06:34 PM.    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  The patient is currently receiving Tagrisso 80 mg daily and is status post 26 months of treatment.  She has been tolerating her treatment well but recently  started having increasing fatigue and weakness as well as nausea, anorexia, and dehydration.  Her last CT scan in March 2021 showed some evidence concerning for disease progression.  At her most recent office visit, it was recommended for her to have a PET scan.  MRI of the brain was performed this hospitalization which not show any evidence of metastatic disease.  PET scan will need to be rescheduled when she is discharged from the hospital.  For her metastatic lung cancer, continue Tagrisso 80 mg daily.  Cytology from her pleural fluid showed malignant cells.  We have requested PD-L1 and foundation 1 studies.  It may take 2 to 3 weeks to obtain these results.  We will follow-up with this as an outpatient.  For her new pulmonary emboli, continue Xarelto.  For her history of breast cancer, continue letrozole.  The patient currently has a follow-up appointment scheduled on 02/22/2020.  Ideally, we would like to have her PET scan and foundation 1 and PD-L1 results back prior to seeing her back in the office.  However, the patient's daughter would like her to be seen sooner just to evaluate for any concerning symptoms.  I will keep this appointment as scheduled.   LOS: 7 days   Mikey Bussing, DNP, AGPCNP-BC, AOCNP 02/17/20   ADDENDUM: Hematology/Oncology Attending: I had a face-to-face encounter with the patient today.  I recommended her care plan and I agree with the above note.  This is a very pleasant 79 years old white female with metastatic non-small cell lung cancer, adenocarcinoma that was initially diagnosed in 2012 with positive EGFR mutation status post target therapy with EGFR tyrosine kinase inhibitor initially with Tarceva and most recently with Tagrisso after the patient developed resistant T790M mutation. She has been tolerating her treatment with Tagrisso well.  Recently she started having increasing  fatigue and weakness as well as shortness of breath, anorexia and dehydration.  She had  imaging studies that showed enlargement left pleural effusion.  She underwent thoracentesis with drainage of close to 1 L of pleural fluid and unfortunately the cytology was positive for malignancy.  I requested the tissue block to be sent to foundation 1 for molecular studies and PD-L1 expression. The patient also had MRI of the brain performed recently that showed no clear evidence for metastatic disease to the brain. I recommended for the patient to continue her current treatment with Tagrisso for now.  I will arrange for the patient a follow-up appointment with me at the cancer center after discharge for reevaluation and discussion of any further treatment options based on the molecular studies as well as the PET scan. Thank you so much for taking good care of Ms. Cloyd, I will continue to follow up the patient with you and assist in her management on as-needed basis.  Disclaimer: This note was dictated with voice recognition software. Similar sounding words can inadvertently be transcribed and may be missed upon review. Eilleen Kempf, MD

## 2020-02-17 NOTE — TOC Initial Note (Signed)
Transition of Care [facility]) - Initial/Assessment Note    Patient Details  Name: Kristin [name] MRN: [id number] Date of Birth: 1941-08-30  Transition of Care [facility]) CM/SW Contact:    Ross Ludwig, LCSW Phone Number: 02/17/2020, 5:35 PM  Clinical Narrative:                  CSW attempted to speak to patient however she was sleeping.  CSW did speak to patient's daughter via phone to complete assessment.  Patient is a 79 year old female who is married and alert and oriented x4.  Patient lives with her husband, and daughter lives nearby.  Patient was seen by PT and they recommended home health, CSW spoke to patient's daughter to discuss agencies, and she said they have had home health in the past with [facility], and are agreeable to having them see her again.  Patient's daughter reported that she will need oxygen and a wheelchair due to weakness.  Patient qualified for oxygen, and [facility] is able to accept patient's oxygen and wheelchair request.  Patient's daughter did not express any other questions CSW to continue to follow patient's progress throughout discharge planning.  Expected Discharge Plan: [place] Barriers to Discharge: Continued Medical Work up   Patient Goals and CMS Choice Patient states their goals for this hospitalization and ongoing recovery are:: To return home with home health. CMS Medicare.gov Compare Post Acute Care list provided to:: Patient Represenative (must comment) Choice offered to / list presented to : Adult Children  Expected Discharge Plan and Services Expected Discharge Plan: [place] Choice: [place] arrangements for the past 2 months: Single Family Home                 DME Arranged: Oxygen wheelchair DME Agency: [facility] Date DME Agency Contacted: 02/17/20 Time DME Agency Contacted: [phone]   [place]: RN, PT, OT, Nurse's Aide HH Agency: [place] Date [place]: 02/17/20 Time [place]: 1400 Representative spoke with at [place]: Malachy Mood  Prior Living Arrangements/Services Living arrangements for the past 2 months: [place] with:: Spouse Patient language and need for interpreter reviewed:: Yes Do you feel safe going back to the place where you live?: Yes      Need for Family Participation in Patient Care: Yes (Comment) Care giver support system in place?: No (comment)   Criminal Activity/Legal Involvement Pertinent to Current Situation/Hospitalization: No - Comment as needed  Activities of Daily Living Home Assistive Devices/Equipment: Cane (specify quad or straight), Bedside commode/3-in-1, Eyeglasses, Shower chair with back, Environmental consultant (specify type), Reacher(front wheeled walker, single point cane) ADL Screening (condition at time of admission) Patient's cognitive ability adequate to safely complete daily activities?: Yes Is the patient deaf or have difficulty hearing?: No Does the patient have difficulty seeing, even when wearing glasses/contacts?: No Does the patient have difficulty concentrating, remembering, or making decisions?: No Patient able to express need for assistance with ADLs?: Yes Does the patient have difficulty dressing or bathing?: Yes Independently performs ADLs?: No Communication: Independent Dressing (OT): Needs assistance Is this a change from baseline?: Change from baseline, expected to last >3 days Grooming: Needs assistance Is this a change from baseline?: Change from baseline, expected to last >3 days Feeding: Needs assistance Is this a change from baseline?: Change from baseline, expected to last >3 days Bathing: Needs assistance Is this a change from baseline?:  Change from baseline, expected to last >3 days Toileting: Needs assistance Is this a change from baseline?: Change from baseline, expected to last >3days In/Out Bed: Needs assistance Is this a change from baseline?:  Change from baseline, expected to last >3 days Walks in Home: Needs assistance Is this a change from baseline?: Change from baseline, expected to last >3 days Does the patient have difficulty walking or climbing stairs?: Yes(secondary to weakness) Weakness of Legs: Both Weakness of Arms/Hands: Both  Permission Sought/Granted Permission sought to share information with : Family Supports Permission granted to share information with : Yes, Verbal Permission Granted  Share Information with NAME: Wendall Mola Daughter [phone]  [phone]  Permission granted to share info w AGENCY: [place]        Emotional Assessment Appearance:: Appears stated age Attitude/Demeanor/Rapport: Engaged Affect (typically observed): Accepting, Calm, Stable Orientation: : Oriented to Self, Oriented to Place, Oriented to  Time, Oriented to Situation Alcohol / Substance Use: Not Applicable Psych Involvement: No (comment)  Admission diagnosis:  Pulmonary embolism (HCC) [I26.99] Weakness [R53.1] Pleural effusion [J90] Personal history of colon cancer [Z85.038] Fever [R50.9] Patient Active Problem List   Diagnosis Date Noted  . Persistent atrial fibrillation ([place])   . Acute respiratory failure ([place])   . Paroxysmal atrial fibrillation (HCC)   . Pulmonary embolism ([place]) 02/10/2020  . Fever 02/09/2020  . Acute pulmonary embolism ([place]) 02/09/2020  . Pleural effusion 02/09/2020  . 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 Malnutrition of moderate degree 06/15/2018  . CKD (chronic kidney disease), stage III 06/11/2018  . Large bowel obstruction ([place]) 06/11/2018  . Acute lower UTI 06/11/2018  . Encounter for antineoplastic chemotherapy 10/28/2016  . Anemia of chronic disease 09/21/2016  . 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 S/P hip replacement, left 08/07/2016  . 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 Genetic testing 06/20/2016  . Family history of breast cancer   . Family history of colon cancer   . Family history of brain cancer   . Family history of prostate cancer   . Melanocytic nevi of trunk 03/07/2016  . Seborrheic keratoses of trunk 03/06/2016  . Status post partial colectomy 03/06/2016  . 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 Osteoporosis 04/25/2015  . Disc disorder 05/30/2014  . Arthritis of left hip 05/30/2014  . Occipital cerebral infarction ([place]) 04/30/2013  . Hypertension 04/29/2013  . Nonspecific abnormal finding in stool contents 07/31/2011  . Esophageal reflux 07/31/2011  . Benign neoplasm of colon 07/31/2011  . Hernia, hiatal 07/31/2011  . Cancer of upper lobe of left lung ([place]) 06/13/2011  . Personal history of breast cancer 06/13/2011  . Diverticulosis of colon (without mention of hemorrhage) 06/13/2011  . Adenocarcinoma of lung ([place]) 01/01/2011  . Status of breast implant 05/20/2010  . Menopausal syndrome 07/03/2004  . Hypercholesterolemia 01/05/2002  . Absence of bladder continence 12/02/2000  . Depression 12/02/2000   PCP:  Eber Hong, MD Pharmacy:   [place], [place] - [address] [place] [address] Phone: [phone] Fax: [phone]  [id number] - [place], [place] - [address] [place] [address] Phone: [phone] Fax: [phone]     Social Determinants of Health (SDOH) Interventions    Readmission Risk Interventions No flowsheet data found.

## 2020-02-17 NOTE — Plan of Care (Signed)
  Problem: Activity: Goal: Risk for activity intolerance will decrease Outcome: Not Progressing   Problem: Health Behavior/Discharge Planning: Goal: Ability to manage health-related needs will improve Outcome: Adequate for Discharge

## 2020-02-17 NOTE — Progress Notes (Signed)
SATURATION QUALIFICATIONS: (This note is used to comply with regulatory documentation for home oxygen)  Patient Saturations on Room Air at Rest ----did not tolerate  Patient Saturations on Room Air while Ambulating  -unable to tolerate, On 2 liters 85% while ambulating  Patient Saturations on 2 Liters of oxygen while Ambulating  93  Please briefly explain why patient needs home oxygen:   Kristin [name]

## 2020-02-17 NOTE — Progress Notes (Signed)
Nutrition Follow-up  DOCUMENTATION CODES:   Non-severe (moderate) malnutrition in context of chronic illness  INTERVENTION:  - continue [employer] Breakfast with whole milk at breakfast. - will decrease Boost Breeze from BID to once/day. - continue Ensure Enlive BID.  - continue to encourage intakes of meals.    NUTRITION DIAGNOSIS:   Moderate Malnutrition related to chronic illness, cancer and cancer related treatments as evidenced by moderate fat depletion, moderate muscle depletion, severe muscle depletion. -ongoing  GOAL:   Patient will meet greater than or equal to 90% of their needs -unmet  MONITOR:   PO intake, Supplement acceptance, Weight trends, I & O's, Labs  ASSESSMENT:   79 year old female with past medical history of metastatic non-small cell lung cancer with h/o colon cancer s/p colostomy and h/o breast cancer, HTN, CVA, presented with increasing SOB and weakness over the past few day, and weight loss. CT angiogram revealed right lower lobe pulmonary embolism with small clot burden and enlarging left pleural effusion concerning for recurrence of cancer.  Per flow sheet documentation, she ate 10% of breakfast, 25% of lunch, and 10% of dinner on 4/25; no other intakes documented this admission. Weight has been stable overall since admission on 4/21.  Patient has been accepting Boost Breeze and Ensure Enlive ~25% of the time offered.   Per notes: - malignant L pleural effusion - hx NSCLC and adenocarcinoma of the colon s/p colostomy  - weekly CXR per PCCM recommendation (next: 5/4) - acute RLL pulmonary embolism  - urinary retention - moderate protein calorie malnutrition    Labs reviewed; BUN: 40 mg/dl, GFR: 53 ml/min. Medication reviewed; 2000 units vitamin D/day, 40 mg oral lasix BID, daily multivitamin with minerals, 1 packet metamucil/day.    Diet Order:   Diet Order            Diet Heart Room service appropriate? Yes; Fluid consistency: Thin   Diet effective now              EDUCATION NEEDS:   Education needs have been addressed  Skin:  Skin Assessment: Reviewed RN Assessment  Last BM:  4/28  Height:   Ht Readings from Last 1 Encounters:  02/10/20 5\' 6"  (1.676 m)    Weight:   Wt Readings from Last 1 Encounters:  02/17/20 59.5 kg    Estimated Nutritional Needs:  Kcal:  1950-2200 Protein:  90-110 Fluid:  >/= 1.9 L/day     Jarome Matin, MS, RD, LDN, CNSC Inpatient Clinical Dietitian RD pager # available in [facility]  After hours/weekend pager # available in [facility]

## 2020-02-18 DIAGNOSIS — C3412 Malignant neoplasm of upper lobe, left bronchus or lung: Secondary | ICD-10-CM | POA: Diagnosis not present

## 2020-02-18 DIAGNOSIS — N390 Urinary tract infection, site not specified: Secondary | ICD-10-CM | POA: Diagnosis not present

## 2020-02-18 DIAGNOSIS — C3491 Malignant neoplasm of unspecified part of right bronchus or lung: Secondary | ICD-10-CM | POA: Diagnosis not present

## 2020-02-18 DIAGNOSIS — I2699 Other pulmonary embolism without acute cor pulmonale: Secondary | ICD-10-CM | POA: Diagnosis not present

## 2020-02-18 DIAGNOSIS — J9601 Acute respiratory failure with hypoxia: Secondary | ICD-10-CM | POA: Diagnosis not present

## 2020-02-18 LAB — CULTURE, BLOOD (ROUTINE X 2)
Culture: NO GROWTH
Culture: NO GROWTH
Special Requests: ADEQUATE
Special Requests: ADEQUATE

## 2020-02-18 NOTE — Progress Notes (Signed)
ANTICOAGULATION CONSULT NOTE - Follow Up Consult  Pharmacy Consult for Xarelto Indication: acute pulmonary embolus and DVT  Allergies  Allergen Reactions  . Doxycycline Nausea Only    Weak, stomach  . Sulfa Antibiotics Rash    Patient Measurements: Height: 5\' 6"  (167.6 cm) Weight: 59.9 kg (132 lb) IBW/kg (Calculated) : 59.3 Heparin Dosing Weight: 58 kg  Vital Signs: Temp: 97.8 F (36.6 C) (04/30 0809) Temp Source: Oral (04/30 0442) BP: 108/49 (04/30 0809) Pulse Rate: 65 (04/30 0809)  Labs: Recent Labs    02/16/20 0228 02/17/20 0334  HGB  --  8.9*  HCT  --  28.3*  PLT  --  263  CREATININE 1.10* 1.00    Estimated Creatinine Clearance: 42 mL/min (by C-G formula based on SCr of 1 mg/dL).  Assessment: Patient's an 80 y.o F with hx breast, colon and metastatic non-small cell lung cancer on Tagrisso and Femara PTA, presented to the ED on 4/21 with c/o SOB.  Chest CTA was positive for acute PE and left pleural effusion.  LE dopplers + DVT.  Pharmacy consulted for xarelto dosing.  Today, 02/18/2020: - last cbc on 4/29 stable - no bleeding documented - scr stable  Goal of Therapy:  Monitor platelets by anticoagulation protocol: Yes   Plan:  - continue Xarelto 15mg  po twice daily x 21 days then Xarelto 20 po once daily thereafter - monitor for s/sx bleeding  Dia Sitter, PharmD, BCPS 02/18/2020 9:42 AM

## 2020-02-18 NOTE — Progress Notes (Signed)
PROGRESS NOTE    Kristin [name]  ZOX:[id number] DOB: [birth year] DOA: 02/09/2020 PCP: Eber Hong, MD    Brief Narrative:  Kristin [name] is a 79 y.o.femalewithhistory of metastatic non-small cell lung cancer with history of colon cancer status post colostomy and history of breast cancer with history of hypertension CVA has been experiencing increasing shortness of breath and weakness over the last few days with increasing weight loss had followed up with her oncologist 3 days ago at that time patient was placed on fluids and empirically started on Macrobid. Despite which patient is still having increasing weakness and the last 2 days had fevers noted at home with temperature 101 F. Which prompted patient to come to the ER.  On admission, creatinine 1.2, elevated alk phos, leukocytosis, hemoglobin 10.6, BNP elevated 717.3. Patient met criteria for SIRS, work-up revealed acute PE, large left pleural effusion. 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  CT angiogram chest segmental pulmonary emboli, interval enlargement partially loculated left pleural effusion; trace right pleural effusion.  Underwent left thoracentesis by pulmonology on 02/10/2020 with 929m  bloody fluid removed and cytology malignant cells present.  MRI brain with and without contrast with no metastatic disease noted. --Blood cultures x2 02/09/2020: No growth x5 days --Blood cultures 02/13/2020: No growth x 5 days --Pleural fluid culture: Few WBC present on Gram stain, no organisms seen; no growth x3 days --Discontinued vancomycin/ceftriaxone/Flagyl 4/27. --Outpatient PET scan, will need to be rescheduled --PCCM recommends weekly chest x-rays (next 5/4) or if symptoms worsen to follow pleural fluid reaccumulation.  They do not recommend Pleurx placement at this time given severe debilitating pain from pleural fluid removal previously. --Furosemide 40 mg p.o. twice daily --Continue letrozole 2.5 mg p.o. daily and Tagrisso 81mPO daily --Continue supplemental oxygen, maintain SPO2 greater than 92%, now down to 2 L nasal cannula --Has follow-up scheduled with medical oncology on 02/22/2020  Acute right lower lobe pulmonary embolism Acute left lower extremity DVT Acute hypoxic respiratory failure CT angiogram chest with segmental pulmonary emboli and ultrasound lower extremity with acute DVT left posterior tibial veins and left peroneal vein, no DVT on right. --Continue Xarelto  Paroxysmal atrial fibrillation with RVR --Cardiology following, appreciate assistance --Continue amiodarone 200 mg p.o. daily --Anticoagulation with Xarelto --Continue to monitor on telemetry  Acute on chronic diastolic congestive heart failure TTE 02/10/2020 with EF 5081-19%grade 1 diastolic dysfunction, RV systolic function moderately reduced, inferior vena cava dilated in size.  Repeat chest x-ray this morning with continued pulmonary edema and pleural effusion. --Continue furosemide to 40 mg p.o. BID --Continue ACE inhibitor --Continue to monitor strict I's and O's and daily weights  E. coli UTI: Completed 6-day course of ceftriaxone on 02/14/2020  Essential hypertension:  BP 96/34 --Discontinued  amlodipine --Decrease lisinopril  from 20 mg to 10 mg p.o. daily --Continue Lasix as above --Continue monitor blood pressure closely and will continue to adjust accordingly  CKD stage IIIa Creatinine 1.10, stable.  Normocytic anemia Hemoglobin 8.9, stable.  Questionable hypothyroidism TSH low 0.01, free T4 elevated 1.49, T3 within normal limits.  Also on amiodarone currently. --Recommend repeat thyroid studies in 4 weeks, if TSH continues to be low with elevated free T4, may need to start methimazole  Urinary retention Foley catheter removed on 02/15/2020.  Appears to be voiding independently. --Continue to monitor urine output closely --bladder scans as needed  Weakness: --PT/OT recommend home health PT/OT.  Given her severe deconditioning/debility, patient would benefit from a manual wheelchair until her strength improves.  Family has declined SNF placement.  Moderate  protein calorie malnutrition Body mass index is 21.31 kg/m.  Nutrition Status: Nutrition Problem: Moderate Malnutrition Etiology: chronic illness, cancer and cancer related treatments Signs/Symptoms: moderate fat depletion, moderate muscle depletion, severe muscle depletion Interventions: Boost Breeze, Magic cup, [employer], Education  --Continue to encourage increased oral intake   DVT prophylaxis: Xarelto Code Status: Full code Family Communication: Discussed with patient's daughter who is present at bedside  Disposition Plan:  Status is: Inpatient  Remains inpatient appropriate because:Ongoing diagnostic testing needed not appropriate for outpatient work up, Unsafe d/c plan, IV treatments appropriate due to intensity of illness or inability to take PO and Inpatient level of care appropriate due to severity of illness awaiting PT/OT evaluation.   Dispo: The patient is from: Home              Anticipated d/c is to: Home with home health, family has declined SNF              Anticipated  d/c date is: 1 day              Patient currently is not medically stable to d/c.   Consultants:   Cardiology  PCCM -signed off 02/15/2020  Medical oncology  Procedures:   Left thoracentesis 4/22 by PCCM  Antimicrobials:   Ceftriaxone 4/22 - 4/27  Vancomycin 4/21 - 4/27  Zosyn 4/21 - 4/21  Flagyl 4/25 - 4/27     Subjective: Patient seen and examined bedside, resting comfortably.  Daughter present.  Continues with significant debility, weakness and gait disturbance.  Daughter concerned about her coming home without certain equipment such as manual wheelchair given her significant need of assistance with ADLs.  Continues on supplemental oxygen, now titrated down to 2 L per nasal cannula.  No other questions or concerns at this time.  Denies headache, no chest pain, palpitations, no abdominal pain.  No acute events overnight per nursing staff.  Plan likely discharge home tomorrow.  Objective: Vitals:   02/18/20 0442 02/18/20 0809 02/18/20 1004 02/18/20 1246  BP: (!) 94/46 (!) 108/49 (!) 105/42 (!) 94/42  Pulse: 62 65 66 62  Resp: 18 17  20   Temp: 97.6 F (36.4 C) 97.8 F (36.6 C)  98 F (36.7 C)  TempSrc: Oral   Axillary  SpO2: 98% 96%  96%  Weight: 59.9 kg     Height:        Intake/Output Summary (Last 24 hours) at 02/18/2020 1404 Last data filed at 02/18/2020 1020 Gross per 24 hour  Intake 240 ml  Output 750 ml  Net -510 ml   Filed Weights   02/16/20 0408 02/17/20 0434 02/18/20 0442  Weight: 59.1 kg 59.5 kg 59.9 kg    Examination:  General exam: Appears calm and comfortable, thin in appearance Respiratory system: Clear to auscultation. Respiratory effort normal.  On 2 L nasal cannula oxygenating 92% Cardiovascular system: S1 & S2 heard, RRR. No JVD, murmurs, rubs, gallops or clicks. No pedal edema. Gastrointestinal system: Abdomen is nondistended, soft and nontender. No organomegaly or masses felt. Normal bowel sounds heard. Central nervous system: Alert  and oriented. No focal neurological deficits. Extremities: Symmetric 5 x 5 power. Skin: No rashes, lesions or ulcers Psychiatry: Judgement and insight appear normal. Mood & affect appropriate.    Data Reviewed: I have personally reviewed following labs and imaging studies  CBC: Recent Labs  Lab 02/12/20 0259 02/13/20 0251 02/14/20 0245 02/15/20 0201 02/17/20 0334  WBC 14.4* 15.9* 16.8* 15.8* 13.4*  HGB 9.3* 9.8* 9.7* 8.9* 8.9*  HCT 28.3* 30.1* 29.7* 27.7* 28.3*  MCV 87.9 88.3 88.7 88.5 91.3  PLT 182 170 185 205 196   Basic Metabolic Panel: Recent Labs  Lab 02/13/20 0251 02/14/20 0245 02/15/20 0201 02/16/20 0228 02/17/20 0334  NA 136 141 138 139 138  K 3.7 3.9 3.4* 4.2 4.5  CL 105 107 105 107 105  CO2 22 22 25 24 24   GLUCOSE 123* 116* 109* 121* 106*  BUN 32* 38* 37* 42* 40*  CREATININE 0.93 0.98 1.05* 1.10* 1.00  CALCIUM 8.9 8.8* 8.6* 8.6* 8.8*  MG  --   --   --  2.4 2.3   GFR: Estimated Creatinine Clearance: 42 mL/min (by C-G formula based on SCr of 1 mg/dL). Liver Function Tests: No results for input(s): AST, ALT, ALKPHOS, BILITOT, PROT, ALBUMIN in the last 168 hours. No results for input(s): LIPASE, AMYLASE in the last 168 hours. No results for input(s): AMMONIA in the last 168 hours. Coagulation Profile: No results for input(s): INR, PROTIME in the last 168 hours. Cardiac Enzymes: No results for input(s): CKTOTAL, CKMB, CKMBINDEX, TROPONINI in the last 168 hours. BNP (last 3 results) No results for input(s): PROBNP in the last 8760 hours. HbA1C: No results for input(s): HGBA1C in the last 72 hours. CBG: No results for input(s): GLUCAP in the last 168 hours. Lipid Profile: No results for input(s): CHOL, HDL, LDLCALC, TRIG, CHOLHDL, LDLDIRECT in the last 72 hours. Thyroid Function Tests: No results for input(s): TSH, T4TOTAL, FREET4, T3FREE, THYROIDAB in the last 72 hours. Anemia Panel: No results for input(s): VITAMINB12, FOLATE, FERRITIN, TIBC, IRON,  RETICCTPCT in the last 72 hours. Sepsis Labs: Recent Labs  Lab 02/13/20 1315  PROCALCITON 0.37    Recent Results (from the past 240 hour(s))  Blood Culture (routine x 2)     Status: None   Collection Time: 02/09/20  5:33 PM   Specimen: BLOOD RIGHT FOREARM  Result Value Ref Range Status   Specimen Description   Final    BLOOD RIGHT FOREARM Performed at [facility] [address], [place], [place] [address]    Special Requests   Final    BOTTLES DRAWN AEROBIC AND ANAEROBIC Blood Culture results may not be optimal due to an inadequate volume of blood received in culture bottles Performed at [facility] [address] [place], [place] [address]    Culture   Final    NO GROWTH 5 DAYS Performed at [facility], [place] [address], [place], [place] [address]    Report Status 02/14/2020 FINAL  Final  Blood Culture (routine x 2)     Status: None   Collection Time: 02/09/20  5:33 PM  Specimen: BLOOD  Result Value Ref Range Status   Specimen Description   Final    BLOOD RIGHT ANTECUBITAL Performed at [facility] [address], [place], [place] [address]    Special Requests   Final    BOTTLES DRAWN AEROBIC AND ANAEROBIC Blood Culture results may not be optimal due to an inadequate volume of blood received in culture bottles Performed at [facility] [address], [place], [place] [address]    Culture   Final    NO GROWTH 5 DAYS Performed at [facility], [place] [address] [place], [place] [address]    Report Status 02/14/2020 FINAL  Final  Urine culture     Status: Abnormal   Collection Time: 02/09/20  8:36 PM   Specimen: In/Out Cath Urine  Result Value Ref Range Status   Specimen Description   Final    IN/OUT CATH URINE Performed at [facility] [address], [place], [place] [address]    Special Requests   Final    NONE Performed at [facility], [place] [address], [place], [place] [address]    Culture 5,000 COLONIES/mL ESCHERICHIA COLI (A)  Final   Report Status 02/11/2020 FINAL  Final   Organism ID, Bacteria ESCHERICHIA COLI (A)  Final      Susceptibility   Escherichia coli - MIC*    AMPICILLIN 8 SENSITIVE Sensitive     CEFAZOLIN <=4 SENSITIVE Sensitive     CEFTRIAXONE <=1 SENSITIVE Sensitive     CIPROFLOXACIN >=4 RESISTANT Resistant     GENTAMICIN >=16 RESISTANT Resistant     IMIPENEM <=0.25 SENSITIVE Sensitive     NITROFURANTOIN <=16 SENSITIVE Sensitive     TRIMETH/SULFA <=20 SENSITIVE Sensitive     AMPICILLIN/SULBACTAM 4 SENSITIVE Sensitive     PIP/TAZO <=4 SENSITIVE Sensitive     * 5,000 COLONIES/mL ESCHERICHIA COLI  SARS CORONAVIRUS 2 (TAT 6-24 HRS) Nasopharyngeal Nasopharyngeal Swab     Status: None   Collection Time: 02/10/20  3:12 AM   Specimen: Nasopharyngeal Swab  Result Value Ref Range Status   SARS Coronavirus 2 NEGATIVE NEGATIVE Final    Comment: (NOTE) SARS-CoV-2 target nucleic acids are NOT DETECTED. The SARS-CoV-2 RNA is generally detectable in upper and lower respiratory specimens during the acute phase of infection. Negative results do not preclude SARS-CoV-2 infection, do not rule out co-infections with other pathogens, and should not be used as the sole basis for treatment or other patient management decisions. Negative results must be combined with clinical observations, patient history, and epidemiological information. The expected result is Negative. Fact Sheet for Patients: SugarRoll.be Fact Sheet for Healthcare Providers: https://www.woods-mathews.com/ This test is not yet approved or cleared by the [place] FDA and  has been authorized for detection and/or diagnosis of SARS-CoV-2 by FDA under an Emergency Use Authorization (EUA). This EUA will remain  in effect (meaning this test can be used) for the duration of the COVID-19 declaration  under Section 56 4(b)(1) of the Act, 21 U.S.C. section 360bbb-3(b)(1), unless the authorization is terminated or revoked sooner. Performed at [facility], [place] [address] [place], [place] [address]   MRSA PCR Screening     Status: None   Collection Time: 02/10/20  3:32 PM   Specimen: Nasal Mucosa; Nasopharyngeal  Result Value Ref Range Status   MRSA by PCR NEGATIVE NEGATIVE Final    Comment:        The GeneXpert MRSA Assay (FDA  approved for NASAL specimens only), is one component of a comprehensive MRSA colonization surveillance program. It is not intended to diagnose MRSA infection nor to guide or monitor treatment for MRSA infections. Performed at [facility], [place] [address], [place], [place] [address]   Body fluid culture (includes gram stain)     Status: None   Collection Time: 02/10/20  6:23 PM   Specimen: Pleural Fluid  Result Value Ref Range Status   Specimen Description   Final    PLEURAL LEFT Performed at [facility] [address] [place], [place] [address]    Special Requests   Final    Normal Performed at [facility], [place] [address] [place], [place] [address]    Gram Stain   Final    FEW WBC PRESENT, PREDOMINANTLY MONONUCLEAR NO ORGANISMS SEEN    Culture   Final    NO GROWTH 3 DAYS Performed at [facility] [address], [place], [place] [address]    Report Status 02/14/2020 FINAL  Final  Culture, blood (Routine X 2) w Reflex to ID Panel     Status: None   Collection Time: 02/13/20  1:16 PM   Specimen: Left Antecubital; Blood  Result Value Ref Range Status   Specimen Description   Final    LEFT ANTECUBITAL Performed at [facility] [address], [place], [place] [address]    Special Requests   Final    BOTTLES DRAWN AEROBIC ONLY Blood Culture adequate volume Performed at [facility] [address], [place], [place] [address]     Culture   Final    NO GROWTH 5 DAYS Performed at [facility], [place] [address] [place] [address], [place], [place] [address]    Report Status 02/18/2020 FINAL  Final  Culture, blood (Routine X 2) w Reflex to ID Panel     Status: None   Collection Time: 02/13/20  1:19 PM   Specimen: BLOOD LEFT HAND  Result Value Ref Range Status   Specimen Description   Final    BLOOD LEFT HAND Performed at [facility] [address], [place], [place] [address]    Special Requests   Final    BOTTLES DRAWN AEROBIC ONLY Blood Culture adequate volume Performed at [facility] [address] [place], [place] [address]    Culture   Final    NO GROWTH 5 DAYS Performed at [facility], [place] [address], [place], [place] [address]    Report Status 02/18/2020 FINAL  Final         Radiology Studies: No results found.      Scheduled Meds: . amiodarone  200 mg Oral Daily  . brimonidine  1 drop Right Eye BID   And  . timolol  1 drop Right Eye BID  . Chlorhexidine Gluconate Cloth  6 each Topical Daily  . cholecalciferol  2,000 Units Oral Daily  . clopidogrel  75 mg Oral Daily  . cycloSPORINE  1 drop Both Eyes BID  . escitalopram  10 mg Oral Daily  . famotidine  20 mg Oral Daily  . feeding supplement  1 Container Oral Q24H  . feeding supplement (ENSURE ENLIVE)  237 mL Oral BID BM  . feeding supplement (PRO-STAT SUGAR FREE 64)  30 mL Oral BID  . furosemide  40 mg Oral BID  . letrozole  2.5 mg Oral Daily  . lisinopril  10 mg  Oral Daily  . mouth rinse  15 mL Mouth Rinse BID  . multivitamin with minerals  1 tablet Oral Daily  . osimertinib mesylate  80 mg Oral Daily  . pantoprazole  40 mg Oral QAC breakfast  . psyllium  1 packet Oral Daily  . Rivaroxaban  15 mg Oral BID WC   Followed by  . [START ON 03/03/2020] rivaroxaban  20 mg Oral Q supper   Continuous Infusions: . sodium chloride Stopped (02/11/20 0020)     LOS: 8 days    Time spent: 35 minutes  spent on chart review, discussion with nursing staff, consultants, updating family and interview/physical exam; more than 50% of that time was spent in counseling and/or coordination of care.    Shane Melby J [place] DO [facility] Available via Epic secure chat 7am-7pm After these hours, please refer to coverage provider listed on [email or web address] 02/18/2020, 2:04 PM

## 2020-02-18 NOTE — Progress Notes (Signed)
Patient on 2L Nasal Cannula pule saturation range between 96-98%. On Room Air at rest, patient's pulse oxygenation is 90-92%.

## 2020-02-18 NOTE — Progress Notes (Signed)
Occupational Therapy Treatment Patient Details Name: Kristin [name] MRN: [id number] DOB: 1940/10/22 Today's Date: 02/18/2020    History of present illness Pt is an 79 year old woman admitted on 02/10/20 with SOB, weakness, weight loss and fever. + PE and pleural effusion. PMH: lung ca, colon ca with colostomy, breast ca, HTN, CVA, CKD III, afib.   OT comments  Patient continues to present with significant weakness and poor activity tolerance limiting her ability to perform ADLs and mobility. Patient transferred to side of bed and then recliner while therapist educated and instructed patient's family on transfers, DME and how to perform tasks and exercises at home. Patient/family verbalize understanding.    Follow Up Recommendations  SNF;Home health OT    Equipment Recommendations  (Family reports having BSC, RW, shower chair.)    Recommendations for Other Services      Precautions / Restrictions Precautions Precautions: Fall Precaution Comments: monitor BP and o2 sat       Mobility Bed Mobility Overal bed mobility: Needs Assistance Bed Mobility: Sit to Supine       Sit to supine: Mod assist   General bed mobility comments: POC is for patient to go home. Spouse and daughter in room for education/instruction on how to perform transfers and perform basic tasks at home. Patient's bed flattened. Patient min assist with verbal cues to transfer to side of bed with tactile cue for rolling to the left and min assist for trunk lift off. Patient able to scoot to edge of bed. Therapist provided education on positioning of patient for optimal sit to stand including elevated surface and patient at edge of bed and use of gait belt for safety. Patient min assist to stand from elevated bed with RW. Patient able to take steps to recliner with knees buckling today. Patient/family educated on how to use RW safely and techniques for safe transfers. Patient o2 sat maintained above 92% on 2  liters.  Transfers Overall transfer level: Needs assistance Equipment used: Rolling walker (2 wheeled) Transfers: Sit to/from [facility] Sit to Stand: Min assist;+2 safety/equipment Stand pivot transfers: Min assist;+2 safety/equipment       General transfer comment: pt and family reports LE buckling yesterday however pt assisted with standing with cues to "lock" knees and able to take a few steps over to bed; pt and family educated on safe standing transfers, use gait belt    Balance                                           ADL either performed or assessed with clinical judgement   ADL                                         General ADL Comments: Patient/family educated on how to perform safe ADLs at discharge including use of DME, positioning of DME and encouragement for patient to perform as much as possible but at the same time may need assistance for lower body ADLs. Patient and family verbalized understanding.     Vision       Perception     Praxis      Cognition Arousal/Alertness: Awake/alert Behavior During Therapy: WFL for tasks assessed/performed;Flat affect Overall Cognitive Status: Within Functional Limits for tasks assessed  Exercises     Shoulder Instructions       General Comments Pt remained on 2L O2 [place] and SPO2 90-92% during session    Pertinent Vitals/ Pain       Pain Assessment: No/denies pain  Home Living                                          Prior Functioning/Environment              Frequency  Min 2X/week        Progress Toward Goals  OT Goals(current goals can now be found in the care plan section)        Plan      Co-evaluation          OT goals addressed during session: ADL's and self-care;Proper use of Adaptive equipment and DME;Strengthening/ROM(functional mobility,  family/caregiver instruction/education)      AM-PAC OT "6 Clicks" Daily Activity     Outcome Measure                    End of Session Equipment Utilized During Treatment: Gait belt;Rolling walker  OT Visit Diagnosis: Unsteadiness on feet (R26.81);Muscle weakness (generalized) (M62.81)   Activity Tolerance Patient limited by fatigue   Patient Left in chair;with call bell/phone within reach;with family/visitor present   Nurse Communication Mobility status(Reported to RN red skin at thoracic spine.)        Time: 2094-7096 OT Time Calculation (min): 25 min  Charges: OT Treatments $Self Care/Home Management : 8-22 mins $Therapeutic Activity: 8-22 mins  Kristin [name], OTR/L [name]  Office [phone]    Kristin [name] 02/18/2020, 1:35 PM

## 2020-02-18 NOTE — Progress Notes (Addendum)
Physical Therapy Treatment Patient Details Name: Kristin [name] MRN: [id number] DOB: 02-Oct-1941 Today's Date: 02/18/2020    History of Present Illness Pt is an 79 year old woman admitted on 02/10/20 with SOB, weakness, weight loss and fever. + PE and pleural effusion. PMH: lung ca, colon ca with colostomy, breast ca, HTN, CVA, CKD III, afib.    PT Comments    Pt sitting in recliner on arrival, had worked with OT this morning.  Pt reports fatigue and politely declined to ambulate.  Family plans to assist with transfers at home so reinforced safe transfer technique and cues to provide pt while assisting pt back to bed.  Gait belt provided and recommended family use at home for safety. Pt and family had no further questions.  Family will use w/c for mobility into and around home.     Follow Up Recommendations  Home health PT;Supervision/Assistance - 24 hour(family plans for pt to return home)     Equipment Recommendations  None recommended by PT(pt has RW and w/c)    Recommendations for Other Services       Precautions / Restrictions Precautions Precautions: Fall Precaution Comments: monitor BP and o2 sat    Mobility  Bed Mobility Overal bed mobility: Needs Assistance Bed Mobility: Sit to Supine       Up to recliner by OT.  Transfers Overall transfer level: Needs assistance Equipment used: Rolling walker (2 wheeled) Transfers: Sit to/from [facility] Sit to Stand: Min assist;+2 safety/equipment Stand pivot transfers: Min assist;+2 safety/equipment       General transfer comment: pt and family reports LE buckling yesterday however pt assisted with standing with cues to "lock" knees and able to take a few steps over to bed; pt and family educated on safe standing transfers, use gait belt  Ambulation/Gait             General Gait Details: pt did not feel able to tolerate ambulating; discussed awaiting HHPT to start ambulating at home for safety;  family plans to assist with transfers   Stairs             Wheelchair Mobility    Modified Rankin (Stroke Patients Only)       Balance                                            Cognition Arousal/Alertness: Awake/alert Behavior During Therapy: WFL for tasks assessed/performed;Flat affect Overall Cognitive Status: Within Functional Limits for tasks assessed                                        Exercises      General Comments General comments (skin integrity, edema, etc.): Pt remained on 2L O2 [place] and SPO2 90-92% during session      Pertinent Vitals/Pain Pain Assessment: No/denies pain    Home Living                      Prior Function            PT Goals (current goals can now be found in the care plan section) Progress towards PT goals: Progressing toward goals    Frequency    Min 3X/week      PT Plan Current plan remains appropriate  Co-evaluation       OT goals addressed during session: ADL's and self-care;Proper use of Adaptive equipment and DME;Strengthening/ROM(functional mobility, family/caregiver instruction/education)      AM-PAC PT "6 Clicks" Mobility   Outcome Measure  Help needed turning from your back to your side while in a flat bed without using bedrails?: A Little Help needed moving from lying on your back to sitting on the side of a flat bed without using bedrails?: A Little Help needed moving to and from a bed to a chair (including a wheelchair)?: A Little Help needed standing up from a chair using your arms (e.g., wheelchair or bedside chair)?: A Little Help needed to walk in hospital room?: A Lot Help needed climbing 3-5 steps with a railing? : Total 6 Click Score: 15    End of Session Equipment Utilized During Treatment: Gait belt Activity Tolerance: Patient limited by fatigue Patient left: with call bell/phone within reach;in bed;with family/visitor present   PT Visit  Diagnosis: Unsteadiness on feet (R26.81);Difficulty in walking, not elsewhere classified (R26.2)     Time: 8101-7510 PT Time Calculation (min) (ACUTE ONLY): 13 min  Charges:  $Therapeutic Activity: 8-22 mins                     Arlyce Dice, DPT Acute Rehabilitation Services Office: [phone]  York Ram E 02/18/2020, 1:35 PM

## 2020-02-18 NOTE — Progress Notes (Addendum)
HEMATOLOGY-ONCOLOGY PROGRESS NOTE  SUBJECTIVE: Respiratory status stable.  She worked a little bit with PT yesterday.  Will likely be discharged in the next 1 to 2 days.  Plan to go home with home health.  She is not interested in SNF for rehabilitation.  REVIEW OF SYSTEMS:   Constitutional: Reports ongoing generalized weakness, no fevers or chills Respiratory: Shortness of breath improved Cardiovascular: Denies chest pain and palpitations Gastrointestinal:  Denies nausea, heartburn or change in bowel habits Skin: Denies abnormal skin rashes Lymphatics: Denies new lymphadenopathy or easy bruising Neurological:Denies numbness, tingling or new weaknesses Behavioral/Psych: Mood is stable, no new changes  Extremities: No lower extremity edema All other systems were reviewed with the patient and are negative.  I have reviewed the past medical history, past surgical history, social history and family history with the patient and they are unchanged from previous note.   PHYSICAL EXAMINATION: ECOG PERFORMANCE STATUS: 2 - Symptomatic, <50% confined to bed  Vitals:   02/18/20 0809 02/18/20 1004  BP: (!) 108/49 (!) 105/42  Pulse: 65 66  Resp: 17   Temp: 97.8 F (36.6 C)   SpO2: 96%    Filed Weights   02/16/20 0408 02/17/20 0434 02/18/20 0442  Weight: 59.1 kg 59.5 kg 59.9 kg    Intake/Output from previous day: 04/29 0701 - 04/30 0700 In: 540 [P.O.:540] Out: 300 [Urine:300]  GENERAL: Chronically ill-appearing female LUNGS: Diminished breath sounds at bases, left greater than right HEART: Tachycardic, no lower extremity edema ABDOMEN:abdomen soft, non-tender and normal bowel sounds Musculoskeletal:no cyanosis of digits and no clubbing  NEURO: alert & oriented x 3 with fluent speech, no focal motor/sensory deficits  LABORATORY DATA:  I have reviewed the data as listed CMP Latest Ref Rng & Units 02/17/2020 02/16/2020 02/15/2020  Glucose 70 - 99 mg/dL 106(H) 121(H) 109(H)  BUN 8 - 23  mg/dL 40(H) 42(H) 37(H)  Creatinine 0.44 - 1.00 mg/dL 1.00 1.10(H) 1.05(H)  Sodium 135 - 145 mmol/L 138 139 138  Potassium 3.5 - 5.1 mmol/L 4.5 4.2 3.4(L)  Chloride 98 - 111 mmol/L 105 107 105  CO2 22 - 32 mmol/L '24 24 25  '$ Calcium 8.9 - 10.3 mg/dL 8.8(L) 8.6(L) 8.6(L)  Total Protein 6.5 - 8.1 g/dL - - -  Total Bilirubin 0.3 - 1.2 mg/dL - - -  Alkaline Phos 38 - 126 U/L - - -  AST 15 - 41 U/L - - -  ALT 0 - 44 U/L - - -    Lab Results  Component Value Date   WBC 13.4 (H) 02/17/2020   HGB 8.9 (L) 02/17/2020   HCT 28.3 (L) 02/17/2020   MCV 91.3 02/17/2020   PLT 263 02/17/2020   NEUTROABS 9.6 (H) 02/09/2020    CT ANGIO CHEST PE W OR WO CONTRAST  Result Date: 02/09/2020 CLINICAL DATA:  Shortness of breath, weakness, left upper lobe lung cancer EXAM: CT ANGIOGRAPHY CHEST WITH CONTRAST TECHNIQUE: Multidetector CT imaging of the chest was performed using the standard protocol during bolus administration of intravenous contrast. Multiplanar CT image reconstructions and MIPs were obtained to evaluate the vascular anatomy. CONTRAST:  32m OMNIPAQUE IOHEXOL 350 MG/ML SOLN COMPARISON:  12/24/2019 FINDINGS: Cardiovascular: This is a technically adequate evaluation of the pulmonary vasculature. There are segmental right lower lobe pulmonary emboli. Clot burden is minimal. No evidence of right heart strain. No pericardial effusion. Thoracic aorta is normal in caliber with no aneurysm or dissection. Mediastinum/Nodes: No pathologic adenopathy. There is a large hiatal hernia. Esophagus, trachea,  and thyroid are stable. Lungs/Pleura: Since the previous exam, there has been significant enlargement of the left pleural effusion now measuring greater than 1 L in volume. The effusion is partially loculated, with continued areas of pleural thickening. There is dense left lower lobe consolidation consistent with atelectasis. There is a trace right pleural effusion. Patchy hypoventilatory changes are seen at the  right lung base. No pneumothorax. Central airways patent. Upper Abdomen: Stable right lobe liver cyst. Remainder of the upper abdomen is unremarkable. Musculoskeletal: No acute or destructive bony lesions. Reconstructed images demonstrate no additional findings. Review of the MIP images confirms the above findings. IMPRESSION: 1. Right lower lobe segmental pulmonary emboli. Clot burden is minimal. 2. Significant interval enlargement of the partially loculated left pleural effusion, now measuring greater than 1 L in volume. Continued areas of pleural thickening suspicious for recurrent disease. 3. Trace right pleural effusion. 4. Hiatal hernia. These results were called by telephone at the time of interpretation on 02/09/2020 at 11:40 pm to provider [facility] , who verbally acknowledged these results. Electronically Signed   By: Randa Ngo M.D.   On: 02/09/2020 23:40   MR Brain W Wo Contrast  Result Date: 02/15/2020 CLINICAL DATA:  Non-small cell lung cancer.  Staging. EXAM: MRI HEAD WITHOUT AND WITH CONTRAST TECHNIQUE: Multiplanar, multiecho pulse sequences of the brain and surrounding structures were obtained without and with intravenous contrast. CONTRAST:  54m GADAVIST GADOBUTROL 1 MMOL/ML IV SOLN COMPARISON:  04/30/2013 FINDINGS: Brain: Diffusion imaging does not show any acute or subacute infarction. No abnormality affects the brainstem or cerebellum. Cerebral hemispheres show minimal small vessel change of the white matter, less than usually seen at this age. No cortical or large vessel territory infarction. No mass lesion, hemorrhage, hydrocephalus or extra-axial collection. Incidental venous angioma left frontal lobe. Vascular: Major vessels at the base of the brain show flow. Skull and upper cervical spine: No evidence of metastatic disease. 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 Sinuses/Orbits: Clear/normal Other: None IMPRESSION: No evidence of metastatic disease. Minimal  small vessel change of the white matter, less than often seen at this age. Left parietal calvarial hemangioma, unchanged since 2014. No evidence of skull or skull base metastatic disease. Electronically Signed   By: MNelson ChimesM.D.   On: 02/15/2020 14:30   DG Chest Port 1 View  Result Date: 02/15/2020 CLINICAL DATA:  Pleural effusions. EXAM: PORTABLE CHEST 1 VIEW COMPARISON:  Chest x-ray 02/12/2020, 02/11/2020. FINDINGS: Cardiomegaly. Diffuse bilateral pulmonary infiltrates/edema, progressed from prior exam. CHF could present this fashion. Bilateral pneumonia cannot be excluded. Bilateral pleural effusions again noted. No pneumothorax. Surgical sutures noted over the left upper chest. Surgical clips noted over the right chest. No acute bony abnormality identified. IMPRESSION: Cardiomegaly. Diffuse progressive bilateral pulmonary infiltrates/edema. Bilateral pleural effusions again noted. Electronically Signed   By: TMarcello Moores Register   On: 02/15/2020 09:05   DG CHEST PORT 1 VIEW  Result Date: 02/12/2020 CLINICAL DATA:  Shortness of breath EXAM: PORTABLE CHEST 1 VIEW COMPARISON:  02/11/2020 and 02/10/2020 FINDINGS: Cardiomediastinal contours remain enlarged. Bilateral effusions similar to prior study with persistent LEFT lower lobe airspace disease and obscured LEFT hemidiaphragm. Increased interstitial markings persist. No acute bone process. IMPRESSION: No interval change in appearance of the chest since prior study. Bilateral effusions with LEFT lower lobe airspace disease. Persistent increased interstitial markings cardiomegaly. Electronically Signed   By: GZetta BillsM.D.   On: 02/12/2020 18:18   DG Chest Port 1 View  Result Date: 02/11/2020  CLINICAL DATA:  Respiratory failure EXAM: PORTABLE CHEST 1 VIEW COMPARISON:  02/10/2020 FINDINGS: Cardiomegaly with vascular congestion. Small left pleural effusion. Left lower lobe atelectasis. Bilateral interstitial prominence may reflect mild interstitial  edema. IMPRESSION: Cardiomegaly, suspect mild interstitial edema. Small left effusion with left base atelectasis. Electronically Signed   By: Rolm Baptise M.D.   On: 02/11/2020 02:22   DG CHEST PORT 1 VIEW  Result Date: 02/10/2020 CLINICAL DATA:  Post left thoracentesis EXAM: PORTABLE CHEST 1 VIEW COMPARISON:  02/09/2020 FINDINGS: Interval decrease in the left effusion following thoracentesis. Small left effusion remains. No pneumothorax. Left base atelectasis. Mild cardiomegaly. Right lung clear. IMPRESSION: Decreasing left effusion following thoracentesis.  No pneumothorax. Small left effusion and left base atelectasis. Electronically Signed   By: Rolm Baptise M.D.   On: 02/10/2020 19:13   DG Chest Port 1 View  Result Date: 02/09/2020 CLINICAL DATA:  Short of breath EXAM: PORTABLE CHEST 1 VIEW COMPARISON:  06/24/2018, CT 12/24/2019 FINDINGS: Postoperative changes in the left upper lung. Moderate left pleural effusion increased compared to prior. Probable trace right effusion. Airspace disease at the left base. Obscured cardiomediastinal silhouette. Hiatal hernia. Convex opacity over the lower mediastinal silhouette. IMPRESSION: 1. Postsurgical changes of the left chest. Moderate left pleural effusion, increased compared to prior. Probable trace right pleural effusion. Airspace disease at the left lung base. Electronically Signed   By: Donavan Foil M.D.   On: 02/09/2020 18:07   ECHOCARDIOGRAM COMPLETE  Result Date: 02/10/2020    ECHOCARDIOGRAM REPORT   Patient Name:   NADALIE LAUGHNER Date of Exam: 02/10/2020 Medical Rec #:  [id number]       Height:       66.0 in Accession #:    [id number]      Weight:       127.9 lb Date of Birth:  July 04, 1941       BSA:          1.654 m Patient Age:    66 years        BP:           111/58 mmHg Patient Gender: F               HR:           88 bpm. Exam Location:  Inpatient Procedure: 2D Echo and Intracardiac Opacification Agent Indications:    Pulmonary Embolus 415.19 /  I26.99  History:        Patient has prior history of Echocardiogram examinations, most                 recent 02/01/2016. Risk Factors:Hypertension. CKD                 Pleural effusion.  Sonographer:    Vikki Ports Turrentine Referring Phys: 36 Rise Patience  Sonographer Comments: Technically difficult study due to poor echo windows. Image acquisition challenging due to patient body habitus. IMPRESSIONS  1. Left ventricular ejection fraction, by estimation, is 50 to 55%. The left ventricle has low normal function. The left ventricle has no regional wall motion abnormalities. Left ventricular diastolic parameters are consistent with Grade I diastolic dysfunction (impaired relaxation).  2. Right ventricular systolic function is moderately reduced. The right ventricular size is moderately enlarged. D-shaped septum suggestive of RV pressure/volume overload. No complete TR doppler jet so unable to estimate PA systolic pressure.  3. The aortic valve is tricuspid. Aortic valve regurgitation is trivial. No aortic stenosis is present.  4. The mitral valve  is normal in structure. No evidence of mitral valve regurgitation. No evidence of mitral stenosis.  5. The inferior vena cava is dilated in size with <50% respiratory variability, suggesting right atrial pressure of 15 mmHg.  6. Left-sided pleural effusion noted. Trivial pericardial effusion. FINDINGS  Left Ventricle: Left ventricular ejection fraction, by estimation, is 50 to 55%. The left ventricle has low normal function. The left ventricle has no regional wall motion abnormalities. Definity contrast agent was given IV to delineate the left ventricular endocardial borders. The left ventricular internal cavity size was normal in size. There is no left ventricular hypertrophy. Left ventricular diastolic parameters are consistent with Grade I diastolic dysfunction (impaired relaxation). Right Ventricle: The right ventricular size is moderately enlarged. No increase in  right ventricular wall thickness. Right ventricular systolic function is moderately reduced. Tricuspid regurgitation signal is inadequate for assessing PA pressure. Left Atrium: Left atrial size was normal in size. Right Atrium: Right atrial size was normal in size. Pericardium: Trivial pericardial effusion is present. Mitral Valve: The mitral valve is normal in structure. No evidence of mitral valve regurgitation. No evidence of mitral valve stenosis. Tricuspid Valve: The tricuspid valve is normal in structure. Tricuspid valve regurgitation is not demonstrated. Aortic Valve: The aortic valve is tricuspid. Aortic valve regurgitation is trivial. No aortic stenosis is present. Pulmonic Valve: The pulmonic valve was normal in structure. Pulmonic valve regurgitation is not visualized. Aorta: The aortic root is normal in size and structure. Venous: The inferior vena cava is dilated in size with less than 50% respiratory variability, suggesting right atrial pressure of 15 mmHg. IAS/Shunts: No atrial level shunt detected by color flow Doppler.  LEFT VENTRICLE PLAX 2D LVOT diam:     1.80 cm  Diastology LV SV:         36       LV e' lateral:   9.36 cm/s LV SV Index:   22       LV E/e' lateral: 3.6 LVOT Area:     2.54 cm LV e' medial:    7.83 cm/s                         LV E/e' medial:  4.3  LEFT ATRIUM           Index LA Vol (A4C): 36.2 ml 21.89 ml/m  AORTIC VALVE LVOT Vmax:   84.90 cm/s LVOT Vmean:  61.100 cm/s LVOT VTI:    0.141 m MITRAL VALVE MV Area (PHT): 3.37 cm    SHUNTS MV Decel Time: 225 msec    Systemic VTI:  0.14 m MV E velocity: 33.40 cm/s  Systemic Diam: 1.80 cm MV A velocity: 59.70 cm/s MV E/A ratio:  0.56 Loralie Champagne MD Electronically signed by Loralie Champagne MD Signature Date/Time: 02/10/2020/4:49:30 PM    Final    CT RENAL STONE STUDY  Result Date: 02/09/2020 CLINICAL DATA:  Flank pain, kidney stone suspected. EXAM: CT ABDOMEN AND PELVIS WITHOUT CONTRAST TECHNIQUE: Multidetector CT imaging of the  abdomen and pelvis was performed following the standard protocol without IV contrast. COMPARISON:  Abdominal CT 12/24/2019. Concurrent chest CTA, reported separately. FINDINGS: Lower chest: Left pleural effusion and basilar consolidation assessed on concurrent chest CT, reported separately. Moderately large hiatal hernia, partially obscured by motion artifact. Hepatobiliary: Cyst in the right lobe of the liver is grossly stable, motion artifact partially obscures evaluation. There is some high-density material within the gallbladder that may represent sludge. More detailed evaluation is  limited given motion. No biliary dilatation. Pancreas: Motion artifact through the pancreas limits assessment. No ductal dilatation. No obvious peripancreatic inflammation Spleen: Normal in size with small cleft medially. Adrenals/Urinary Tract: No adrenal nodule. No hydronephrosis. No renal or ureteral calculi. Mild bilateral renal parenchymal thinning and cortical scarring in the lower right kidney. There is symmetric bilateral perinephric edema. Urinary bladder is unremarkable, no bladder stone. Stomach/Bowel: Moderately large hiatal hernia. Motion limits more detailed assessment. No evidence of bowel obstruction. No obvious bowel inflammation, lack of contrast and motion limits detailed bowel assessment. Descending colostomy. Mild descending colonic diverticulosis without diverticulitis chain sutures noted in the cecum. Vascular/Lymphatic: Aorto bi-iliac atherosclerosis. No grossly enlarged abdominopelvic lymph nodes, detailed assessment limited given motion and lack of contrast. Reproductive: Calcified uterine fibroids. No adnexal mass. Other: Mild generalized body wall edema. No ascites. No free air. Musculoskeletal: Left hip arthroplasty. Bones are under mineralized. Stable bone island in the mid sacrum. Multilevel degenerative change in the lumbar spine. IMPRESSION: 1. Motion limited exam. 2. No renal stones or obstructive  uropathy. Symmetric bilateral perinephric edema is nonspecific. 3. Moderately large hiatal hernia. 4. High-density material within the gallbladder may represent sludge. More detailed evaluation is limited given motion artifact. Aortic Atherosclerosis (ICD10-I70.0). Electronically Signed   By: Keith Rake M.D.   On: 02/09/2020 23:30   VAS [place] LOWER EXTREMITY VENOUS (DVT)  Result Date: 02/11/2020  Lower Venous DVTStudy Indications: Pulmonary embolism.  Performing Technologist: June Leap RDMS, RVT  Examination Guidelines: A complete evaluation includes B-mode imaging, spectral Doppler, color Doppler, and power Doppler as needed of all accessible portions of each vessel. Bilateral testing is considered an integral part of a complete examination. Limited examinations for reoccurring indications may be performed as noted. The reflux portion of the exam is performed with the patient in reverse Trendelenburg.  +---------+---------------+---------+-----------+----------+--------------+ RIGHT    CompressibilityPhasicitySpontaneityPropertiesThrombus Aging +---------+---------------+---------+-----------+----------+--------------+ CFV      Full           Yes      Yes                                 +---------+---------------+---------+-----------+----------+--------------+ SFJ      Full                                                        +---------+---------------+---------+-----------+----------+--------------+ FV Prox  Full                                                        +---------+---------------+---------+-----------+----------+--------------+ FV Mid   Full                                                        +---------+---------------+---------+-----------+----------+--------------+ FV DistalFull                                                        +---------+---------------+---------+-----------+----------+--------------+  PFV      Full                                                         +---------+---------------+---------+-----------+----------+--------------+ POP      Full           Yes      Yes                                 +---------+---------------+---------+-----------+----------+--------------+ PTV      Full                                                        +---------+---------------+---------+-----------+----------+--------------+ PERO     Full                                                        +---------+---------------+---------+-----------+----------+--------------+   +---------+---------------+---------+-----------+----------+--------------+ LEFT     CompressibilityPhasicitySpontaneityPropertiesThrombus Aging +---------+---------------+---------+-----------+----------+--------------+ CFV      Full           Yes      Yes                                 +---------+---------------+---------+-----------+----------+--------------+ SFJ      Full                                                        +---------+---------------+---------+-----------+----------+--------------+ FV Prox  Full                                                        +---------+---------------+---------+-----------+----------+--------------+ FV Mid   Full                                                        +---------+---------------+---------+-----------+----------+--------------+ FV DistalFull                                                        +---------+---------------+---------+-----------+----------+--------------+ PFV      Full                                                        +---------+---------------+---------+-----------+----------+--------------+  POP      Full           Yes      Yes                                 +---------+---------------+---------+-----------+----------+--------------+ PTV      None                                                         +---------+---------------+---------+-----------+----------+--------------+ PERO     None                                                        +---------+---------------+---------+-----------+----------+--------------+     Summary: RIGHT: - There is no evidence of deep vein thrombosis in the lower extremity.  - No cystic structure found in the popliteal fossa.  LEFT: - Findings consistent with acute deep vein thrombosis involving the left posterior tibial veins, and left peroneal veins. - No cystic structure found in the popliteal fossa.  *See table(s) above for measurements and observations. Electronically signed by Harold Barban MD on 02/11/2020 at 5:06:34 PM.    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  The patient is currently receiving Tagrisso 80 mg daily and is status post 26 months of treatment.  She has been tolerating her treatment well but recently started having increasing fatigue and weakness as well as nausea, anorexia, and dehydration.  Her last CT scan in March 2021 showed some evidence concerning for disease progression.  MRI of the brain with and without contrast was performed here in the hospital which showed no evidence of metastatic disease.  PET scan to be performed as an outpatient.  For her metastatic lung cancer, continue Tagrisso 80 mg daily.  Fluid from thoracentesis showed malignant cells.  We are sending this off her PD-L1 and molecular testing.  Results may take 2 to 3 weeks.  We are working on rescheduling her PET scan as an outpatient.  For her new pulmonary emboli, continue Xarelto.  For her history of breast cancer, continue letrozole.  Outpatient follow-up will be  arranged once we have the results of the PET scan, PD-L1, and molecular testing.  We will contact the patient and her daughter with the date and time.  In the meantime, if she has any concerning symptoms she was advised to call the office.   LOS: 8 days   Mikey Bussing, DNP, AGPCNP-BC, AOCNP 02/18/20   ADDENDUM: Hematology/Oncology Attending: I had a face-to-face encounter with the patient today.  I recommended her care plan and I agree with the above note.  Her daughter [name] was available at the bedside.  The patient is feeling much better today.  She has recent evidence for disease progression with left pleural effusion and the cytology was positive for malignant cells. I requested the tissue  [facility] for molecular studies and PD-L1 expression. The patient may go home with physical therapy.  I will arrange for her to have a PET scan after discharge for further evaluation of her disease and to rule out any other metastatic lesions. I will see the patient back for follow-up visit at the cancer center once the molecular studies and the PET scan are done to discuss her future treatment options. For now she will continue her current treatment with Tagrisso with the same dose. The patient was advised to call if she has any concerning symptoms after discharge. Thank you so much for taking good care of Ms. Sitts, I will continue to follow up the patient with you and assist in her management on as-needed basis.  Disclaimer: This note was dictated with voice recognition software. Similar sounding words can inadvertently be transcribed and may be missed upon review. Eilleen Kempf, MD

## 2020-02-18 NOTE — TOC Progression Note (Signed)
Transition of Care [facility]) - Progression Note    Patient Details  Name: Kristin [name] MRN: [id number] Date of Birth: 09-Oct-1941  Transition of Care [facility]) CM/SW Contact  Ross Ludwig, [place] Phone Number: 02/18/2020, 1:36 PM  Clinical Narrative:     CSW was informed that patient will need oxygen and a wheelchair.  Patient has been set up with [facility] for oxygen and her wheelchair.  CSW to continue to follow patient's progress throughout discharge planning.   Expected Discharge Plan: [place] Barriers to Discharge: Continued Medical Work up  Expected Discharge Plan and Services Expected Discharge Plan: [place] Choice: [place] arrangements for the past 2 months: Single Family Home                 DME Arranged: Oxygen DME Agency: [employer] [facility] Date DME Agency Contacted: 02/17/20 Time DME Agency Contacted: [phone]   HH Arranged: RN, PT, OT, Nurse's Aide HH Agency: [place] Date HH Agency Contacted: 02/17/20 Time HH Agency Contacted: 1400 Representative spoke with at [place]: [place] Determinants of Health ([place]) Interventions    Readmission Risk Interventions No flowsheet data found.

## 2020-02-19 DIAGNOSIS — J9601 Acute respiratory failure with hypoxia: Secondary | ICD-10-CM | POA: Diagnosis not present

## 2020-02-19 DIAGNOSIS — N390 Urinary tract infection, site not specified: Secondary | ICD-10-CM | POA: Diagnosis not present

## 2020-02-19 DIAGNOSIS — I2699 Other pulmonary embolism without acute cor pulmonale: Secondary | ICD-10-CM | POA: Diagnosis not present

## 2020-02-19 DIAGNOSIS — C3491 Malignant neoplasm of unspecified part of right bronchus or lung: Secondary | ICD-10-CM | POA: Diagnosis not present

## 2020-02-19 MED ORDER — FUROSEMIDE 40 MG PO TABS: 40.0000 mg | ORAL_TABLET | Freq: Two times a day (BID) | ORAL | 0 refills | Status: DC

## 2020-02-19 MED ORDER — VITAMIN D3 50 MCG (2000 UT) PO CAPS: 2000.0000 [IU] | ORAL_CAPSULE | Freq: Every day | ORAL | 0 refills | Status: AC

## 2020-02-19 MED ORDER — AMIODARONE HCL 200 MG PO TABS: 200.0000 mg | ORAL_TABLET | Freq: Every day | ORAL | 0 refills | Status: DC

## 2020-02-19 MED ORDER — RIVAROXABAN (XARELTO) VTE STARTER PACK (15 & 20 MG): ORAL_TABLET | ORAL | 0 refills | Status: DC

## 2020-02-19 MED ORDER — LISINOPRIL 10 MG PO TABS: 10.0000 mg | ORAL_TABLET | Freq: Every day | ORAL | 0 refills | Status: DC

## 2020-02-19 NOTE — Progress Notes (Signed)
    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  A rolling walker will not resolve issue with performing activities of daily living. A wheelchair will allow patient to safely perform daily activities. Patient is not able to propel themselves in the home using a standard weight wheelchair due to weakness. Patient can self propel in the lightweight wheelchair. Length of need lifetime. Accessories: elevating leg rests (ELRs), wheel locks, extensions and anti-tippers.   02/17/20 1313

## 2020-02-19 NOTE — Progress Notes (Signed)
SATURATION QUALIFICATIONS: (This note is used to comply with regulatory documentation for home oxygen)  Patient Saturations on Room Air at Rest = 86%  Patient Saturations on Room Air while Ambulating = Unable to tolerate  Patient Saturations on 2 Liters of oxygen while Ambulating = 93%  Please briefly explain why patient needs home oxygen:

## 2020-02-19 NOTE — TOC Progression Note (Signed)
Transition of Care [facility]) - Progression Note    Patient Details  Name: Kristin [name] MRN: [id number] Date of Birth: 06/03/1941  Transition of Care [facility]) CM/SW Contact  Joaquin Courts, RN Phone Number: 02/19/2020, 1:50 PM  Clinical Narrative:    Patient does not wish to wait for wc delivery, requests referral to be cancelled. Patient plans to reach out to pcp office to have this arrangement taken care of.   Expected Discharge Plan: [place] Barriers to Discharge: Continued Medical Work up  Expected Discharge Plan and Services Expected Discharge Plan: [place] Choice: [place] arrangements for the past 2 months: Single Family Home Expected Discharge Date: 02/19/20               DME Arranged: Youth worker wheelchair with seat cushion DME Agency: [employer] Date DME Agency Contacted: 02/19/20 Time DME Agency Contacted: [phone] Representative spoke with at DME Agency: [place]: RN, PT, OT, Nurse's Aide [place] Agency: [place] Date [place]: 02/17/20 Time [place]: 1400 Representative spoke with at [place]: [place] ([place]) Interventions    Readmission Risk Interventions No flowsheet data found.

## 2020-02-19 NOTE — Progress Notes (Signed)
Patient discharged him with daughter/husband. Portable O2 tank sent with patient and was connected/functioning at [facility] upon D/C. Family educated on how to use tank. Daughter called [facility] in [place] prior to D/C and arranged a time to meet at patient's house for further O2 delivery. Family aware of where to pick up rx medications and when to take next dose.

## 2020-02-19 NOTE — Progress Notes (Signed)
    [facility] will sign off.   Medication Recommendations:  Meds per primary team. No new suggestions. Other recommendations (labs, testing, etc):  Per primary team Follow up as an outpatient:  We will arrange out patient follow up in [place] in about one month.

## 2020-02-19 NOTE — TOC Progression Note (Signed)
Transition of Care [facility]) - Progression Note    Patient Details  Name: Kristin [name] MRN: [id number] Date of Birth: 11-25-1940  Transition of Care [facility]) CM/SW Contact  Joaquin Courts, RN Phone Number: 02/19/2020, 12:54 PM  Clinical Narrative:  Referral given to [employer] for wheelchair.      Expected Discharge Plan: [place] Barriers to Discharge: Continued Medical Work up  Expected Discharge Plan and Services Expected Discharge Plan: [place] Choice: [place] arrangements for the past 2 months: Single Family Home Expected Discharge Date: 02/19/20               DME Arranged: Youth worker wheelchair with seat cushion DME Agency: [employer] Date DME Agency Contacted: 02/19/20 Time DME Agency Contacted: [phone] Representative spoke with at DME Agency: [place]: RN, PT, OT, Nurse's Aide [place] Agency: [place] Date [place]: 02/17/20 Time [place]: 1400 Representative spoke with at [place]: [place] ([place]) Interventions    Readmission Risk Interventions No flowsheet data found.

## 2020-02-19 NOTE — Discharge Summary (Addendum)
Physician Discharge Summary  Kristin [name] TKW:[id number] DOB: 1940-11-10 DOA: 02/09/2020  PCP: Eber Hong, MD  Admit date: 02/09/2020 Discharge date: 02/19/2020  Admitted From: Home Disposition: Home  Recommendations for Outpatient Follow-up:  1. Follow up with PCP in 1-2 weeks 2. Follow-up with medical oncology as scheduled Dr. Earlie Server; next appointment 02/22/2020 3. Follow-up with cardiology as scheduled in 1 month 4. Follow-up with pulmonology, if recurrent/symptomatic pleural effusion may consider Pleurx drain placement 5. Started on amiodarone secondary to atrial fibrillation with RVR 6. Started on furosemide 40 mg p.o. daily to help with management of her malignant pleural effusion 7. Discontinued amlodipine secondary to borderline hypotension 8. Decreased lisinopril from 20 mg to 10 mg daily for borderline hypertension 9. Started on Xarelto secondary to right lower lobe pulmonary embolism, left lower remedy DVT 10. Started on Lasix 40 mg p.o. twice daily for decompensated CHF and malignant pleural effusion 11. Recommend repeat BMP 1 week for evaluation of renal function 12. Recommend repeat thyroid studies for concern of possible hyperthyroidism in 4 weeks  Home Health: PT/OT/RN/social work Equipment/Devices: Radio broadcast assistant, oxygen  Discharge Condition: Stable CODE STATUS: Full code Diet recommendation: Cardiac diet  History of present illness:  Kristin [name] is a 79 y.o.femalewithhistory of metastatic non-small cell lung cancer with history of colon cancer status post colostomy and history of breast cancer with history of hypertension CVA has been experiencing increasing shortness of breath and weakness over the last few days with increasing weight loss had followed up with her oncologist 3 days ago at that time patient was placed on fluids and empirically started on Macrobid. Despite which patient is still having increasing weakness and the last 2 days had fevers noted at  home with temperature 101 F. Which prompted patient to come to the ER.  On admission, creatinine 1.2, elevated alk phos, leukocytosis, hemoglobin 10.6, BNP elevated 717.3. Patient met criteria for SIRS, work-up revealed acute PE, large left pleural effusion. Underwent thoracentesis on 4/22, 950 cc of bloody pleural fluid removed  Hospital course:  Malignant loculated left pleural effusion with questionable empyema Hx non-small cell lung cancer Hx adenocarcinoma status post colostomy Patient presenting with progressive fatigue, shortness of breath and weight loss.  CT angiogram chest segmental pulmonary emboli, interval enlargement partially loculated left pleural effusion; trace right pleural effusion.  Underwent left thoracentesis by pulmonology on 02/10/2020 with 962m bloody fluid removed and cytology malignant cells present.  MRI brain with and without contrast with no metastatic disease noted.  Blood cultures on 02/09/2020 and 02/13/2020 with no growth x5 days.  Pleural fluid culture notable for few WBCs on Gram stain but no organisms seen and no growth during hospitalization.  Patient was initially started on empiric antibiotics with vancomycin, ceftriaxone and Flagyl but was discontinued as deemed noninfectious source unlikely secondary to underlying malignancy.  Pulmonology recommends weekly chest x-rays, next due 02/22/2020 or if symptoms worsen to follow-up pleural fluid accumulation.  They do not recommend Pleurx placement at this time given severe debilitating pain from thoracentesis.  Continue diuresis with furosemide 40 mg p.o. twice daily. Continue letrozole 2.5 mg p.o. daily and Tagrisso '80mg'$  PO daily.  Will need to be rescheduled for outpatient PET scan for restaging purposes.  Continue outpatient follow-up with medical oncology, Dr. MEarlie Serverand pulmonology.  Acute right lower lobe pulmonary embolism Acute left lower extremity DVT Acute hypoxic respiratory failure CT angiogram chest  with segmental pulmonary emboli and ultrasound lower extremity with acute DVT left posterior tibial veins and left peroneal vein,  no DVT on right.  Started on Xarelto.  Paroxysmal atrial fibrillation with RVR Cardiology was consulted and followed during hospital course.  Patient was started on amiodarone, will discharge on 200 mg p.o. daily.  Continue anticoagulation with Xarelto.  Outpatient follow-up with cardiology in [place] in approximately 1 month.  Acute on chronic diastolic congestive heart failure TTE 02/10/2020 with EF 17-79%, grade 1 diastolic dysfunction, RV systolic function moderately reduced, inferior vena cava dilated in size.  Repeat chest x-ray this morning with continued pulmonary edema and pleural effusion. Continue furosemide to 40 mg p.o. BID.  Reduce lisinopril to 10 mg p.o. daily due to borderline hypotension.  E. coli UTI: Completed 6-day course of ceftriaxone on 02/14/2020  Essential hypertension:  Due to borderline hypotension, amlodipine was discontinued and lisinopril decreased from 20 to 10 mg p.o. daily.  Continues on Lasix 40 mg p.o. twice daily for diastolic congestive heart failure and pleural fluid management.  Outpatient follow-up with cardiology.  CKD stage IIIa Creatinine 1.10, stable.  Recommend repeat BMP in 1-2 weeks.  Normocytic anemia Hemoglobin 8.9, stable.  Questionable hypothyroidism TSH low 0.01, free T4 elevated 1.49, T3 within normal limits.  Also on amiodarone currently. Recommend repeat thyroid studies in 4 weeks, if TSH continues to be low with elevated free T4, may need to start methimazole  Urinary retention Foley catheter removed on 02/15/2020.  Appears to be voiding independently.  Weakness: PT/OT recommend home health PT/OT.  Given her severe deconditioning/debility, patient would benefit from a manual wheelchair until her strength improves.  Family has declined SNF placement.  Moderate  protein calorie malnutrition Body  mass index is 21.31 kg/m.  Nutrition Status: Nutrition Problem: Moderate Malnutrition Etiology: chronic illness, cancer and cancer related treatments Signs/Symptoms: moderate fat depletion, moderate muscle depletion, severe muscle depletion Interventions: Boost Breeze, Magic cup, [employer], Education  --Continue to encourage increased oral intake  Discharge Diagnoses:  Principal Problem:   Acute pulmonary embolism ([place]) Active Problems:   Cancer of upper lobe of left lung (HCC)   Hypertension   Malignant neoplasm of ascending colon s/p robotic colectomy 03/06/2016   Adenocarcinoma of lung (HCC)   CKD (chronic kidney disease), stage III   Acute lower UTI   Pleural effusion   Pulmonary embolism (HCC)   Acute respiratory failure (HCC)   Paroxysmal atrial fibrillation (HCC)   Persistent atrial fibrillation [facility])    Discharge Instructions  Discharge Instructions    (HEART FAILURE PATIENTS) Call MD:  Anytime you have any of the following symptoms: 1) 3 pound weight gain in 24 hours or 5 pounds in 1 week 2) shortness of breath, with or without a dry hacking cough 3) swelling in the hands, feet or stomach 4) if you have to sleep on extra pillows at night in order to breathe.   Complete by: As directed    Call MD for:  difficulty breathing, headache or visual disturbances   Complete by: As directed    Call MD for:  extreme fatigue   Complete by: As directed    Call MD for:  persistant dizziness or light-headedness   Complete by: As directed    Call MD for:  persistant nausea and vomiting   Complete by: As directed    Call MD for:  severe uncontrolled pain   Complete by: As directed    Call MD for:  temperature >100.4   Complete by: As directed    Diet - low sodium heart healthy   Complete by: As  directed    Increase activity slowly   Complete by: As directed      Allergies as of 02/19/2020      Reactions   Doxycycline Nausea Only   Weak, stomach   Sulfa  Antibiotics Rash      Medication List    STOP taking these medications   amLODipine 5 MG tablet Commonly known as: NORVASC   nitrofurantoin (macrocrystal-monohydrate) 100 MG capsule Commonly known as: Macrobid     TAKE these medications   amiodarone 200 MG tablet Commonly known as: PACERONE Take 1 tablet (200 mg total) by mouth daily.   brimonidine-timolol 0.2-0.5 % ophthalmic solution Commonly known as: COMBIGAN Place 1 drop into the right eye every 12 (twelve) hours.   clopidogrel 75 MG tablet Commonly known as: PLAVIX Take 75 mg by mouth daily.   cycloSPORINE 0.05 % ophthalmic emulsion Commonly known as: RESTASIS Place 1 drop into both eyes 2 (two) times daily.   escitalopram 10 MG tablet Commonly known as: LEXAPRO Take 10 mg by mouth daily.   famotidine 40 MG tablet Commonly known as: PEPCID Take 40 mg by mouth daily.   feeding supplement (ENSURE ENLIVE) Liqd Take 237 mLs by mouth 2 (two) times daily between meals.   Folivane-Plus Caps TAKE 1 CAPSULE BY MOUTH EVERY DAY IN THE MORNING What changed: See the new instructions.   furosemide 40 MG tablet Commonly known as: LASIX Take 1 tablet (40 mg total) by mouth 2 (two) times daily.   letrozole 2.5 MG tablet Commonly known as: FEMARA TAKE 1 TABLET BY MOUTH EVERY DAY   lisinopril 10 MG tablet Commonly known as: ZESTRIL Take 1 tablet (10 mg total) by mouth daily. What changed:   medication strength  how much to take   multivitamins with iron Tabs tablet Take 1 tablet by mouth daily.   osimertinib mesylate 80 MG tablet Commonly known as: TAGRISSO Take 1 tablet (80 mg total) by mouth daily.   PreserVision AREDS 2 Caps Take by mouth.   prochlorperazine 10 MG tablet Commonly known as: COMPAZINE Take 1 tablet (10 mg total) by mouth every 6 (six) hours as needed for nausea or vomiting.   Rivaroxaban Stater Pack (15 mg and 20 mg) Commonly known as: XARELTO STARTER PACK Follow package directions:  Take one 74m tablet by mouth twice a day. On day 22, switch to one 265mtablet once a day. Take with food.   Vitamin D3 50 MCG (2000 UT) capsule Take 1 capsule (2,000 Units total) by mouth daily.            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  A rolling walker will not resolve issue with performing activities of daily living.  A wheelchair will allow patient to safely perform daily activities. Patient is not able to propel themselves in the home using a standard weight wheelchair due to weakness. Patient can self propel in the lightweight wheelchair. Length of need lifetime. Accessories: elevating leg rests (ELRs), wheel locks, extensions and anti-tippers.   02/17/20 1313         Follow-up Information    Verta Ellen., NP Follow up on 03/13/2020.   Specialty: Cardiology Why: Cardiology Hospital Follow-up on 03/13/2020 at 1:00 PM.  Contact information: [place] [address] [phone]        Eber Hong, MD. Schedule an appointment as soon as possible for a visit in 1 week(s).   Specialty: Internal Medicine Contact information: [address] [place] [address] [phone]        Herminio Commons, MD .   Specialty:  Cardiology Contact information: [place] [address] [phone]          Allergies  Allergen Reactions  . Doxycycline Nausea Only    Weak, stomach  . Sulfa Antibiotics Rash    Consultations:  Cardiology  PCCM -signed off 02/15/2020  Medical oncology   Procedures/Studies: CT ANGIO CHEST PE W OR WO CONTRAST  Result Date: 02/09/2020 CLINICAL DATA:  Shortness of breath, weakness, left upper lobe lung cancer EXAM: CT ANGIOGRAPHY CHEST WITH CONTRAST TECHNIQUE: Multidetector CT imaging of the chest was performed using the standard protocol during bolus administration of intravenous contrast. Multiplanar CT image reconstructions and MIPs were obtained to evaluate the vascular anatomy. CONTRAST:  70m OMNIPAQUE IOHEXOL 350 MG/ML SOLN COMPARISON:  12/24/2019 FINDINGS: Cardiovascular: This is a technically adequate evaluation of the pulmonary vasculature. There are segmental right lower lobe pulmonary emboli. Clot burden is minimal. No evidence of right heart strain. No pericardial effusion. Thoracic aorta is normal in caliber with no aneurysm or dissection. Mediastinum/Nodes: No pathologic adenopathy. There is a large hiatal hernia. Esophagus, trachea, and thyroid are stable. Lungs/Pleura: Since the previous exam, there has been significant enlargement of the left pleural effusion now measuring greater than 1 L in volume. The effusion is partially loculated, with continued areas of pleural thickening. There is dense left lower lobe consolidation consistent with atelectasis. There is a trace right pleural effusion. Patchy hypoventilatory changes are seen at the right lung base. No pneumothorax. Central airways patent. Upper Abdomen: Stable right lobe liver cyst. Remainder of the upper abdomen is unremarkable. Musculoskeletal: No acute or destructive bony lesions. Reconstructed images demonstrate no additional findings. Review of the MIP images confirms the above findings.  IMPRESSION: 1. Right lower lobe segmental pulmonary emboli. Clot burden is minimal. 2. Significant interval enlargement of the partially loculated left pleural effusion, now measuring greater than 1 L in volume. Continued areas of pleural thickening suspicious for recurrent disease. 3. Trace right pleural effusion. 4. Hiatal hernia. These results were called by telephone at the time of interpretation on 02/09/2020 at 11:40 pm to provider [facility], who verbally acknowledged these results. Electronically Signed   By: MRanda NgoM.D.   On: 02/09/2020 23:40   MR Brain W Wo Contrast  Result Date: 02/15/2020 CLINICAL DATA:  Non-small cell lung cancer.  Staging. EXAM: MRI HEAD WITHOUT AND WITH CONTRAST TECHNIQUE: Multiplanar, multiecho pulse sequences of the brain and surrounding structures were obtained without and with intravenous contrast. CONTRAST:  470mGADAVIST GADOBUTROL 1 MMOL/ML IV SOLN COMPARISON:  04/30/2013 FINDINGS: Brain: Diffusion imaging does not  show any acute or subacute infarction. No abnormality affects the brainstem or cerebellum. Cerebral hemispheres show minimal small vessel change of the white matter, less than usually seen at this age. No cortical or large vessel territory infarction. No mass lesion, hemorrhage, hydrocephalus or extra-axial collection. Incidental venous angioma left frontal lobe. Vascular: Major vessels at the base of the brain show flow. Skull and upper cervical spine: No evidence of metastatic disease. 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 Sinuses/Orbits: Clear/normal Other: None IMPRESSION: No evidence of metastatic disease. Minimal small vessel change of the white matter, less than often seen at this age. Left parietal calvarial hemangioma, unchanged since 2014. No evidence of skull or skull base metastatic disease. Electronically Signed   By: Nelson Chimes M.D.   On: 02/15/2020 14:30   DG Chest Port 1 View  Result Date: 02/15/2020 CLINICAL  DATA:  Pleural effusions. EXAM: PORTABLE CHEST 1 VIEW COMPARISON:  Chest x-ray 02/12/2020, 02/11/2020. FINDINGS: Cardiomegaly. Diffuse bilateral pulmonary infiltrates/edema, progressed from prior exam. CHF could present this fashion. Bilateral pneumonia cannot be excluded. Bilateral pleural effusions again noted. No pneumothorax. Surgical sutures noted over the left upper chest. Surgical clips noted over the right chest. No acute bony abnormality identified. IMPRESSION: Cardiomegaly. Diffuse progressive bilateral pulmonary infiltrates/edema. Bilateral pleural effusions again noted. Electronically Signed   By: Marcello Moores  Register   On: 02/15/2020 09:05   DG CHEST PORT 1 VIEW  Result Date: 02/12/2020 CLINICAL DATA:  Shortness of breath EXAM: PORTABLE CHEST 1 VIEW COMPARISON:  02/11/2020 and 02/10/2020 FINDINGS: Cardiomediastinal contours remain enlarged. Bilateral effusions similar to prior study with persistent LEFT lower lobe airspace disease and obscured LEFT hemidiaphragm. Increased interstitial markings persist. No acute bone process. IMPRESSION: No interval change in appearance of the chest since prior study. Bilateral effusions with LEFT lower lobe airspace disease. Persistent increased interstitial markings cardiomegaly. Electronically Signed   By: Zetta Bills M.D.   On: 02/12/2020 18:18   DG Chest Port 1 View  Result Date: 02/11/2020 CLINICAL DATA:  Respiratory failure EXAM: PORTABLE CHEST 1 VIEW COMPARISON:  02/10/2020 FINDINGS: Cardiomegaly with vascular congestion. Small left pleural effusion. Left lower lobe atelectasis. Bilateral interstitial prominence may reflect mild interstitial edema. IMPRESSION: Cardiomegaly, suspect mild interstitial edema. Small left effusion with left base atelectasis. Electronically Signed   By: Rolm Baptise M.D.   On: 02/11/2020 02:22   DG CHEST PORT 1 VIEW  Result Date: 02/10/2020 CLINICAL DATA:  Post left thoracentesis EXAM: PORTABLE CHEST 1 VIEW COMPARISON:   02/09/2020 FINDINGS: Interval decrease in the left effusion following thoracentesis. Small left effusion remains. No pneumothorax. Left base atelectasis. Mild cardiomegaly. Right lung clear. IMPRESSION: Decreasing left effusion following thoracentesis.  No pneumothorax. Small left effusion and left base atelectasis. Electronically Signed   By: Rolm Baptise M.D.   On: 02/10/2020 19:13   DG Chest Port 1 View  Result Date: 02/09/2020 CLINICAL DATA:  Short of breath EXAM: PORTABLE CHEST 1 VIEW COMPARISON:  06/24/2018, CT 12/24/2019 FINDINGS: Postoperative changes in the left upper lung. Moderate left pleural effusion increased compared to prior. Probable trace right effusion. Airspace disease at the left base. Obscured cardiomediastinal silhouette. Hiatal hernia. Convex opacity over the lower mediastinal silhouette. IMPRESSION: 1. Postsurgical changes of the left chest. Moderate left pleural effusion, increased compared to prior. Probable trace right pleural effusion. Airspace disease at the left lung base. Electronically Signed   By: Donavan Foil M.D.   On: 02/09/2020 18:07   ECHOCARDIOGRAM COMPLETE  Result Date: 02/10/2020  ECHOCARDIOGRAM REPORT   Patient Name:   Kristin [name] Date of Exam: 02/10/2020 Medical Rec #:  [id number]       Height:       66.0 in Accession #:    [id number]      Weight:       127.9 lb Date of Birth:  [birth year]       BSA:          1.654 m Patient Age:    37 years        BP:           111/58 mmHg Patient Gender: F               HR:           88 bpm. Exam Location:  Inpatient Procedure: 2D Echo and Intracardiac Opacification Agent Indications:    Pulmonary Embolus 415.19 / I26.99  History:        Patient has prior history of Echocardiogram examinations, most                 recent 02/01/2016. Risk Factors:Hypertension. CKD                 Pleural effusion.  Sonographer:    Vikki Ports Turrentine Referring Phys: 25 Rise Patience  Sonographer Comments: Technically difficult study due  to poor echo windows. Image acquisition challenging due to patient body habitus. IMPRESSIONS  1. Left ventricular ejection fraction, by estimation, is 50 to 55%. The left ventricle has low normal function. The left ventricle has no regional wall motion abnormalities. Left ventricular diastolic parameters are consistent with Grade I diastolic dysfunction (impaired relaxation).  2. Right ventricular systolic function is moderately reduced. The right ventricular size is moderately enlarged. D-shaped septum suggestive of RV pressure/volume overload. No complete TR doppler jet so unable to estimate PA systolic pressure.  3. The aortic valve is tricuspid. Aortic valve regurgitation is trivial. No aortic stenosis is present.  4. The mitral valve is normal in structure. No evidence of mitral valve regurgitation. No evidence of mitral stenosis.  5. The inferior vena cava is dilated in size with <50% respiratory variability, suggesting right atrial pressure of 15 mmHg.  6. Left-sided pleural effusion noted. Trivial pericardial effusion. FINDINGS  Left Ventricle: Left ventricular ejection fraction, by estimation, is 50 to 55%. The left ventricle has low normal function. The left ventricle has no regional wall motion abnormalities. Definity contrast agent was given IV to delineate the left ventricular endocardial borders. The left ventricular internal cavity size was normal in size. There is no left ventricular hypertrophy. Left ventricular diastolic parameters are consistent with Grade I diastolic dysfunction (impaired relaxation). Right Ventricle: The right ventricular size is moderately enlarged. No increase in right ventricular wall thickness. Right ventricular systolic function is moderately reduced. Tricuspid regurgitation signal is inadequate for assessing PA pressure. Left Atrium: Left atrial size was normal in size. Right Atrium: Right atrial size was normal in size. Pericardium: Trivial pericardial effusion is  present. Mitral Valve: The mitral valve is normal in structure. No evidence of mitral valve regurgitation. No evidence of mitral valve stenosis. Tricuspid Valve: The tricuspid valve is normal in structure. Tricuspid valve regurgitation is not demonstrated. Aortic Valve: The aortic valve is tricuspid. Aortic valve regurgitation is trivial. No aortic stenosis is present. Pulmonic Valve: The pulmonic valve was normal in structure. Pulmonic valve regurgitation is not visualized. Aorta: The aortic root is normal in size and structure. Venous: The inferior vena cava is  dilated in size with less than 50% respiratory variability, suggesting right atrial pressure of 15 mmHg. IAS/Shunts: No atrial level shunt detected by color flow Doppler.  LEFT VENTRICLE PLAX 2D LVOT diam:     1.80 cm  Diastology LV SV:         36       LV e' lateral:   9.36 cm/s LV SV Index:   22       LV E/e' lateral: 3.6 LVOT Area:     2.54 cm LV e' medial:    7.83 cm/s                         LV E/e' medial:  4.3  LEFT ATRIUM           Index LA Vol (A4C): 36.2 ml 21.89 ml/m  AORTIC VALVE LVOT Vmax:   84.90 cm/s LVOT Vmean:  61.100 cm/s LVOT VTI:    0.141 m MITRAL VALVE MV Area (PHT): 3.37 cm    SHUNTS MV Decel Time: 225 msec    Systemic VTI:  0.14 m MV E velocity: 33.40 cm/s  Systemic Diam: 1.80 cm MV A velocity: 59.70 cm/s MV E/A ratio:  0.56 Loralie Champagne MD Electronically signed by Loralie Champagne MD Signature Date/Time: 02/10/2020/4:49:30 PM    Final    CT RENAL STONE STUDY  Result Date: 02/09/2020 CLINICAL DATA:  Flank pain, kidney stone suspected. EXAM: CT ABDOMEN AND PELVIS WITHOUT CONTRAST TECHNIQUE: Multidetector CT imaging of the abdomen and pelvis was performed following the standard protocol without IV contrast. COMPARISON:  Abdominal CT 12/24/2019. Concurrent chest CTA, reported separately. FINDINGS: Lower chest: Left pleural effusion and basilar consolidation assessed on concurrent chest CT, reported separately. Moderately large hiatal  hernia, partially obscured by motion artifact. Hepatobiliary: Cyst in the right lobe of the liver is grossly stable, motion artifact partially obscures evaluation. There is some high-density material within the gallbladder that may represent sludge. More detailed evaluation is limited given motion. No biliary dilatation. Pancreas: Motion artifact through the pancreas limits assessment. No ductal dilatation. No obvious peripancreatic inflammation Spleen: Normal in size with small cleft medially. Adrenals/Urinary Tract: No adrenal nodule. No hydronephrosis. No renal or ureteral calculi. Mild bilateral renal parenchymal thinning and cortical scarring in the lower right kidney. There is symmetric bilateral perinephric edema. Urinary bladder is unremarkable, no bladder stone. Stomach/Bowel: Moderately large hiatal hernia. Motion limits more detailed assessment. No evidence of bowel obstruction. No obvious bowel inflammation, lack of contrast and motion limits detailed bowel assessment. Descending colostomy. Mild descending colonic diverticulosis without diverticulitis chain sutures noted in the cecum. Vascular/Lymphatic: Aorto bi-iliac atherosclerosis. No grossly enlarged abdominopelvic lymph nodes, detailed assessment limited given motion and lack of contrast. Reproductive: Calcified uterine fibroids. No adnexal mass. Other: Mild generalized body wall edema. No ascites. No free air. Musculoskeletal: Left hip arthroplasty. Bones are under mineralized. Stable bone island in the mid sacrum. Multilevel degenerative change in the lumbar spine. IMPRESSION: 1. Motion limited exam. 2. No renal stones or obstructive uropathy. Symmetric bilateral perinephric edema is nonspecific. 3. Moderately large hiatal hernia. 4. High-density material within the gallbladder may represent sludge. More detailed evaluation is limited given motion artifact. Aortic Atherosclerosis (ICD10-I70.0). Electronically Signed   By: Keith Rake M.D.    On: 02/09/2020 23:30   VAS [place] LOWER EXTREMITY VENOUS (DVT)  Result Date: 02/11/2020  Lower Venous DVTStudy Indications: Pulmonary embolism.  Performing Technologist: June Leap RDMS, RVT  Examination Guidelines: A complete evaluation  includes B-mode imaging, spectral Doppler, color Doppler, and power Doppler as needed of all accessible portions of each vessel. Bilateral testing is considered an integral part of a complete examination. Limited examinations for reoccurring indications may be performed as noted. The reflux portion of the exam is performed with the patient in reverse Trendelenburg.  +---------+---------------+---------+-----------+----------+--------------+ RIGHT    CompressibilityPhasicitySpontaneityPropertiesThrombus Aging +---------+---------------+---------+-----------+----------+--------------+ CFV      Full           Yes      Yes                                 +---------+---------------+---------+-----------+----------+--------------+ SFJ      Full                                                        +---------+---------------+---------+-----------+----------+--------------+ FV Prox  Full                                                        +---------+---------------+---------+-----------+----------+--------------+ FV Mid   Full                                                        +---------+---------------+---------+-----------+----------+--------------+ FV DistalFull                                                        +---------+---------------+---------+-----------+----------+--------------+ PFV      Full                                                        +---------+---------------+---------+-----------+----------+--------------+ POP      Full           Yes      Yes                                 +---------+---------------+---------+-----------+----------+--------------+ PTV      Full                                                         +---------+---------------+---------+-----------+----------+--------------+ PERO     Full                                                        +---------+---------------+---------+-----------+----------+--------------+   +---------+---------------+---------+-----------+----------+--------------+  LEFT     CompressibilityPhasicitySpontaneityPropertiesThrombus Aging +---------+---------------+---------+-----------+----------+--------------+ CFV      Full           Yes      Yes                                 +---------+---------------+---------+-----------+----------+--------------+ SFJ      Full                                                        +---------+---------------+---------+-----------+----------+--------------+ FV Prox  Full                                                        +---------+---------------+---------+-----------+----------+--------------+ FV Mid   Full                                                        +---------+---------------+---------+-----------+----------+--------------+ FV DistalFull                                                        +---------+---------------+---------+-----------+----------+--------------+ PFV      Full                                                        +---------+---------------+---------+-----------+----------+--------------+ POP      Full           Yes      Yes                                 +---------+---------------+---------+-----------+----------+--------------+ PTV      None                                                        +---------+---------------+---------+-----------+----------+--------------+ PERO     None                                                        +---------+---------------+---------+-----------+----------+--------------+     Summary: RIGHT: - There is no evidence of deep vein thrombosis in the lower extremity.  - No  cystic structure found in the popliteal fossa.  LEFT: - Findings consistent with acute deep vein thrombosis involving the left posterior tibial veins, and left  peroneal veins. - No cystic structure found in the popliteal fossa.  *See table(s) above for measurements and observations. Electronically signed by Harold Barban MD on 02/11/2020 at 5:06:34 PM.    Final       Subjective: Patient seen and examined bedside, resting comfortably.  Continues on 2 L nasal cannula.  Daughter and husband present.  Ready for discharge home.  Denies headache, no fever/chills/night sweats, no nausea/vomiting/diarrhea, no chest pain, palpitations, no abdominal pain.  No acute events overnight per nursing staff.  Discharge Exam: Vitals:   02/18/20 2014 02/19/20 0532  BP: (!) 100/44 (!) 112/48  Pulse: 65 66  Resp: 18 19  Temp: 98.1 F (36.7 C) 98.2 F (36.8 C)  SpO2: 97% 95%   Vitals:   02/18/20 1719 02/18/20 2014 02/19/20 0532 02/19/20 0619  BP:  (!) 100/44 (!) 112/48   Pulse: 66 65 66   Resp:  18 19   Temp:  98.1 F (36.7 C) 98.2 F (36.8 C)   TempSrc:      SpO2: 91% 97% 95%   Weight:    61.4 kg  Height:        General: Pt is alert, awake, not in acute distress, thin in appearance Cardiovascular: RRR, S1/S2 +, no rubs, no gallops Respiratory: Breath sounds slightly decreased bilateral bases, normal respiratory effort, on 2 L nasal cannula Abdominal: Soft, NT, ND, bowel sounds + Extremities: no edema, no cyanosis    The results of significant diagnostics from this hospitalization (including imaging, microbiology, ancillary and laboratory) are listed below for reference.     Microbiology: Recent Results (from the past 240 hour(s))  Blood Culture (routine x 2)     Status: None   Collection Time: 02/09/20  5:33 PM   Specimen: BLOOD RIGHT FOREARM  Result Value Ref Range Status   Specimen Description   Final    BLOOD RIGHT FOREARM Performed at [facility] [address], [place], [place] [address]    Special Requests   Final    BOTTLES DRAWN AEROBIC AND ANAEROBIC Blood Culture results may not be optimal due to an inadequate volume of blood received in culture bottles Performed at [facility] [address] [place], [place] [address]    Culture   Final    NO GROWTH 5 DAYS Performed at [facility], [place] [address] [place], [place] [address]    Report Status 02/14/2020 FINAL  Final  Blood Culture (routine x 2)     Status: None   Collection Time: 02/09/20  5:33 PM   Specimen: BLOOD  Result Value Ref Range Status   Specimen Description   Final    BLOOD RIGHT ANTECUBITAL Performed at [facility] [address] [place] [address], [place], [place] [address]    Special Requests   Final    BOTTLES DRAWN AEROBIC AND ANAEROBIC Blood Culture results may not be optimal due to an inadequate volume of blood received in culture bottles Performed at [facility] [address] [place] [address], [place], [place] [address]    Culture   Final    NO GROWTH 5 DAYS Performed at [facility], [place] [address] [place], [place] [address]    Report Status 02/14/2020 FINAL  Final  Urine culture     Status: Abnormal   Collection Time: 02/09/20  8:36 PM   Specimen: In/Out Cath Urine  Result Value Ref Range Status   Specimen Description   Final  IN/OUT CATH URINE Performed at [facility], [place] [address], [place], [place] [address]    Special Requests   Final    NONE Performed at [facility], [place] [address] [place], [place] [address]    Culture 5,000 COLONIES/mL ESCHERICHIA COLI (A)  Final   Report Status 02/11/2020 FINAL  Final   Organism ID, Bacteria ESCHERICHIA COLI (A)  Final      Susceptibility   Escherichia coli - MIC*    AMPICILLIN 8 SENSITIVE Sensitive     CEFAZOLIN <=4 SENSITIVE Sensitive     CEFTRIAXONE <=1 SENSITIVE Sensitive     CIPROFLOXACIN >=4 RESISTANT Resistant      GENTAMICIN >=16 RESISTANT Resistant     IMIPENEM <=0.25 SENSITIVE Sensitive     NITROFURANTOIN <=16 SENSITIVE Sensitive     TRIMETH/SULFA <=20 SENSITIVE Sensitive     AMPICILLIN/SULBACTAM 4 SENSITIVE Sensitive     PIP/TAZO <=4 SENSITIVE Sensitive     * 5,000 COLONIES/mL ESCHERICHIA COLI  SARS CORONAVIRUS 2 (TAT 6-24 HRS) Nasopharyngeal Nasopharyngeal Swab     Status: None   Collection Time: 02/10/20  3:12 AM   Specimen: Nasopharyngeal Swab  Result Value Ref Range Status   SARS Coronavirus 2 NEGATIVE NEGATIVE Final    Comment: (NOTE) SARS-CoV-2 target nucleic acids are NOT DETECTED. The SARS-CoV-2 RNA is generally detectable in upper and lower respiratory specimens during the acute phase of infection. Negative results do not preclude SARS-CoV-2 infection, do not rule out co-infections with other pathogens, and should not be used as the sole basis for treatment or other patient management decisions. Negative results must be combined with clinical observations, patient history, and epidemiological information. The expected result is Negative. Fact Sheet for Patients: SugarRoll.be Fact Sheet for Healthcare Providers: https://www.woods-mathews.com/ This test is not yet approved or cleared by the [place] FDA and  has been authorized for detection and/or diagnosis of SARS-CoV-2 by FDA under an Emergency Use Authorization (EUA). This EUA will remain  in effect (meaning this test can be used) for the duration of the COVID-19 declaration under Section 56 4(b)(1) of the Act, 21 U.S.C. section 360bbb-3(b)(1), unless the authorization is terminated or revoked sooner. Performed at [facility], [place] [address] [place], [place] [address]   MRSA PCR Screening     Status: None   Collection Time: 02/10/20  3:32 PM   Specimen: Nasal Mucosa; Nasopharyngeal  Result Value Ref Range Status   MRSA by PCR NEGATIVE NEGATIVE Final    Comment:         The GeneXpert MRSA Assay (FDA approved for NASAL specimens only), is one component of a comprehensive MRSA colonization surveillance program. It is not intended to diagnose MRSA infection nor to guide or monitor treatment for MRSA infections. Performed at [facility], [place] [address] [place], [place] [address]   Body fluid culture (includes gram stain)     Status: None   Collection Time: 02/10/20  6:23 PM   Specimen: Pleural Fluid  Result Value Ref Range Status   Specimen Description   Final    PLEURAL LEFT Performed at [facility] [address], [place],  [address]    Special Requests   Final    Normal Performed at [facility], [place] [address], [place], [place] [address]    Gram Stain   Final    FEW WBC PRESENT, PREDOMINANTLY MONONUCLEAR NO ORGANISMS SEEN    Culture   Final  NO GROWTH 3 DAYS Performed at [facility], [place] [address] [place], [place] [address]    Report Status 02/14/2020 FINAL  Final  Culture, blood (Routine X 2) w Reflex to ID Panel     Status: None   Collection Time: 02/13/20  1:16 PM   Specimen: Left Antecubital; Blood  Result Value Ref Range Status   Specimen Description   Final    LEFT ANTECUBITAL Performed at [facility] [address], [place], [place] [address]    Special Requests   Final    BOTTLES DRAWN AEROBIC ONLY Blood Culture adequate volume Performed at [facility] [address], [place], [place] [address]    Culture   Final    NO GROWTH 5 DAYS Performed at [facility], [place] [address], [place], [place] [address]    Report Status 02/18/2020 FINAL  Final  Culture, blood (Routine X 2) w Reflex to ID Panel     Status: None   Collection Time: 02/13/20  1:19 PM   Specimen: BLOOD LEFT HAND  Result Value Ref Range Status   Specimen Description   Final    BLOOD LEFT HAND Performed at [facility] [address] [place], [place] [address]    Special Requests   Final    BOTTLES DRAWN AEROBIC ONLY Blood Culture adequate volume Performed at [facility] [address] [place], [place] [address]    Culture   Final    NO GROWTH 5 DAYS Performed at [facility], [place] [address], [place], [place] [address]    Report Status 02/18/2020 FINAL  Final     Labs: BNP (last 3 results) Recent Labs    02/10/20 1339  BNP 017.5*   Basic Metabolic Panel: Recent Labs  Lab 02/13/20 0251 02/14/20 0245 02/15/20 0201 02/16/20 0228 02/17/20 0334  NA 136 141 138 139 138  K 3.7 3.9 3.4* 4.2 4.5  CL 105 107 105 107 105  CO2 _0 GLUCOSE 123* 116* 109* 121* 106*  BUN 32* 38* 37* 42* 40*  CREATININE 0.93 0.98 1.05* 1.10* 1.00  CALCIUM 8.9 8.8* 8.6* 8.6* 8.8*  MG  --   --   --  2.4 2.3   Liver Function Tests: No results for input(s): AST, ALT, ALKPHOS, BILITOT, PROT, ALBUMIN in the last 168 hours. No results for input(s): LIPASE, AMYLASE in the last 168 hours. No results for input(s): AMMONIA in the last 168 hours. CBC: Recent Labs  Lab 02/13/20 0251 02/14/20 0245 02/15/20 0201 02/17/20 0334  WBC 15.9* 16.8* 15.8* 13.4*  HGB 9.8* 9.7* 8.9* 8.9*  HCT 30.1* 29.7* 27.7* 28.3*  MCV 88.3 88.7 88.5 91.3  PLT 170 185 205 263   Cardiac Enzymes: No results for input(s): CKTOTAL, CKMB, CKMBINDEX, TROPONINI in the last 168 hours. BNP: Invalid input(s): POCBNP CBG: No results for input(s): GLUCAP in the last 168 hours. D-Dimer No results for input(s): DDIMER in the last 72 hours. Hgb A1c No results for input(s): HGBA1C in the last 72 hours. Lipid Profile No results for input(s): CHOL, HDL, LDLCALC, TRIG, CHOLHDL, LDLDIRECT in the last 72 hours. Thyroid function studies No results for input(s): TSH, T4TOTAL, T3FREE, THYROIDAB in the last 72 hours.  Invalid input(s): FREET3 Anemia work up No results for input(s): VITAMINB12, FOLATE,  FERRITIN, TIBC, IRON, RETICCTPCT in the last 72 hours. Urinalysis    Component Value Date/Time   COLORURINE  YELLOW 02/09/2020 2036   APPEARANCEUR HAZY (A) 02/09/2020 2036   LABSPEC 1.031 (H) 02/09/2020 2036   PHURINE 5.0 02/09/2020 2036   GLUCOSEU NEGATIVE 02/09/2020 2036   HGBUR NEGATIVE 02/09/2020 2036   BILIRUBINUR NEGATIVE 02/09/2020 2036   KETONESUR NEGATIVE 02/09/2020 2036   PROTEINUR 100 (A) 02/09/2020 2036   UROBILINOGEN 0.2 04/29/2013 2243   NITRITE NEGATIVE 02/09/2020 2036   LEUKOCYTESUR LARGE (A) 02/09/2020 2036   Sepsis Labs Invalid input(s): PROCALCITONIN,  WBC,  LACTICIDVEN Microbiology Recent Results (from the past 240 hour(s))  Blood Culture (routine x 2)     Status: None   Collection Time: 02/09/20  5:33 PM   Specimen: BLOOD RIGHT FOREARM  Result Value Ref Range Status   Specimen Description   Final    BLOOD RIGHT FOREARM Performed at [facility], [place] [address], [place], [place] [address]    Special Requests   Final    BOTTLES DRAWN AEROBIC AND ANAEROBIC Blood Culture results may not be optimal due to an inadequate volume of blood received in culture bottles Performed at [facility] [address], [place], [place] [address]    Culture   Final    NO GROWTH 5 DAYS Performed at [facility], [place] [address] [place], [place] [address]    Report Status 02/14/2020 FINAL  Final  Blood Culture (routine x 2)     Status: None   Collection Time: 02/09/20  5:33 PM   Specimen: BLOOD  Result Value Ref Range Status   Specimen Description   Final    BLOOD RIGHT ANTECUBITAL Performed at [facility] [address] [place], [place] [address]    Special Requests   Final    BOTTLES DRAWN AEROBIC AND ANAEROBIC Blood Culture results may not be optimal due to an inadequate volume of blood received in culture bottles Performed at [facility] [address], [place], [place] [address]     Culture   Final    NO GROWTH 5 DAYS Performed at [facility], [place] [address], [place], [place] [address]    Report Status 02/14/2020 FINAL  Final  Urine culture     Status: Abnormal   Collection Time: 02/09/20  8:36 PM   Specimen: In/Out Cath Urine  Result Value Ref Range Status   Specimen Description   Final    IN/OUT CATH URINE Performed at [facility] [address], [place], [place] [address]    Special Requests   Final    NONE Performed at [facility], [place] [address] [place], [place] [address]    Culture 5,000 COLONIES/mL ESCHERICHIA COLI (A)  Final   Report Status 02/11/2020 FINAL  Final   Organism ID, Bacteria ESCHERICHIA COLI (A)  Final      Susceptibility   Escherichia coli - MIC*    AMPICILLIN 8 SENSITIVE Sensitive     CEFAZOLIN <=4 SENSITIVE Sensitive     CEFTRIAXONE <=1 SENSITIVE Sensitive     CIPROFLOXACIN >=4 RESISTANT Resistant     GENTAMICIN >=16 RESISTANT Resistant     IMIPENEM <=0.25 SENSITIVE Sensitive     NITROFURANTOIN <=16 SENSITIVE Sensitive     TRIMETH/SULFA <=20 SENSITIVE Sensitive     AMPICILLIN/SULBACTAM 4 SENSITIVE Sensitive     PIP/TAZO <=4 SENSITIVE Sensitive     * 5,000 COLONIES/mL ESCHERICHIA COLI  SARS CORONAVIRUS 2 (TAT 6-24 HRS) Nasopharyngeal Nasopharyngeal Swab     Status: None  Collection Time: 02/10/20  3:12 AM   Specimen: Nasopharyngeal Swab  Result Value Ref Range Status   SARS Coronavirus 2 NEGATIVE NEGATIVE Final    Comment: (NOTE) SARS-CoV-2 target nucleic acids are NOT DETECTED. The SARS-CoV-2 RNA is generally detectable in upper and lower respiratory specimens during the acute phase of infection. Negative results do not preclude SARS-CoV-2 infection, do not rule out co-infections with other pathogens, and should not be used as the sole basis for treatment or other patient management decisions. Negative results must be combined with clinical observations, patient history,  and epidemiological information. The expected result is Negative. Fact Sheet for Patients: SugarRoll.be Fact Sheet for Healthcare Providers: https://www.woods-mathews.com/ This test is not yet approved or cleared by the [place] FDA and  has been authorized for detection and/or diagnosis of SARS-CoV-2 by FDA under an Emergency Use Authorization (EUA). This EUA will remain  in effect (meaning this test can be used) for the duration of the COVID-19 declaration under Section 56 4(b)(1) of the Act, 21 U.S.C. section 360bbb-3(b)(1), unless the authorization is terminated or revoked sooner. Performed at [facility], [place] [address], [place], [place] [address]   MRSA PCR Screening     Status: None   Collection Time: 02/10/20  3:32 PM   Specimen: Nasal Mucosa; Nasopharyngeal  Result Value Ref Range Status   MRSA by PCR NEGATIVE NEGATIVE Final    Comment:        The GeneXpert MRSA Assay (FDA approved for NASAL specimens only), is one component of a comprehensive MRSA colonization surveillance program. It is not intended to diagnose MRSA infection nor to guide or monitor treatment for MRSA infections. Performed at [facility], [place] [address], [place], [place] [address]   Body fluid culture (includes gram stain)     Status: None   Collection Time: 02/10/20  6:23 PM   Specimen: Pleural Fluid  Result Value Ref Range Status   Specimen Description   Final    PLEURAL LEFT Performed at [place] [address] [place], [place] [address]    Special Requests   Final    Normal Performed at [facility], [place] [address] [place], [place] [address]    Gram Stain   Final    FEW WBC PRESENT, PREDOMINANTLY MONONUCLEAR NO ORGANISMS SEEN    Culture   Final    NO GROWTH 3 DAYS Performed at [facility] [address] [place], [place] [address]    Report Status 02/14/2020  FINAL  Final  Culture, blood (Routine X 2) w Reflex to ID Panel     Status: None   Collection Time: 02/13/20  1:16 PM   Specimen: Left Antecubital; Blood  Result Value Ref Range Status   Specimen Description   Final    LEFT ANTECUBITAL Performed at [facility] [address] [place], [place] [address]    Special Requests   Final    BOTTLES DRAWN AEROBIC ONLY Blood Culture adequate volume Performed at [facility] [address], [place], [place] [address]    Culture   Final    NO GROWTH 5 DAYS Performed at [facility], [place] [address] [place],  [address]    Report Status 02/18/2020 FINAL  Final  Culture, blood (Routine X 2) w Reflex to ID Panel     Status: None   Collection Time: 02/13/20  1:19 PM   Specimen: BLOOD LEFT HAND  Result Value Ref Range Status   Specimen Description   Final    BLOOD LEFT HAND Performed at [facility] [address], [place], [place] [address]    Special Requests   Final    BOTTLES DRAWN AEROBIC ONLY Blood Culture adequate volume Performed at [facility] [address], [place], [place] [address]    Culture   Final    NO GROWTH 5 DAYS Performed at [facility], [place] [address] [place], [place] [address]    Report Status 02/18/2020 FINAL  Final     Time coordinating discharge: Over 30 minutes  SIGNED:   Carri Spillers J [place] DO  [facility] 02/19/2020, 9:07 AM

## 2020-02-21 ENCOUNTER — Other Ambulatory Visit: Payer: Self-pay | Admitting: *Deleted

## 2020-02-22 ENCOUNTER — Inpatient Hospital Stay: Payer: Medicare Other | Admitting: Internal Medicine

## 2020-02-22 ENCOUNTER — Inpatient Hospital Stay: Payer: Medicare Other

## 2020-02-23 ENCOUNTER — Telehealth: Payer: Self-pay | Admitting: Internal Medicine

## 2020-02-23 NOTE — Telephone Encounter (Signed)
Scheduled appt per 5/3 sch message - pt daughter aware of appt

## 2020-02-24 ENCOUNTER — Telehealth: Payer: Self-pay | Admitting: Medical Oncology

## 2020-02-24 ENCOUNTER — Ambulatory Visit (HOSPITAL_COMMUNITY): Payer: Medicare Other

## 2020-02-24 NOTE — Telephone Encounter (Signed)
LFTs elevated -Drawn yesterday by [facility]. Dr Eber Hong said they were higher than previous result.

## 2020-02-28 ENCOUNTER — Other Ambulatory Visit: Payer: Self-pay | Admitting: Medical Oncology

## 2020-02-28 DIAGNOSIS — C3490 Malignant neoplasm of unspecified part of unspecified bronchus or lung: Secondary | ICD-10-CM

## 2020-03-01 ENCOUNTER — Encounter (HOSPITAL_COMMUNITY): Payer: Medicare Other

## 2020-03-03 ENCOUNTER — Encounter (HOSPITAL_COMMUNITY)
Admission: RE | Admit: 2020-03-03 | Discharge: 2020-03-03 | Disposition: A | Discharge status: Home / Self Care | Payer: Medicare Other | Source: Ambulatory Visit | Attending: Internal Medicine | Admitting: Internal Medicine

## 2020-03-03 ENCOUNTER — Other Ambulatory Visit: Payer: Self-pay

## 2020-03-03 DIAGNOSIS — Z79899 Other long term (current) drug therapy: Secondary | ICD-10-CM | POA: Diagnosis not present

## 2020-03-03 DIAGNOSIS — E041 Nontoxic single thyroid nodule: Secondary | ICD-10-CM | POA: Insufficient documentation

## 2020-03-03 DIAGNOSIS — J90 Pleural effusion, not elsewhere classified: Secondary | ICD-10-CM | POA: Diagnosis not present

## 2020-03-03 DIAGNOSIS — C3490 Malignant neoplasm of unspecified part of unspecified bronchus or lung: Secondary | ICD-10-CM | POA: Diagnosis present

## 2020-03-03 LAB — GLUCOSE, CAPILLARY: Glucose-Capillary: 113 mg/dL — ABNORMAL HIGH (ref 70–99)

## 2020-03-03 MED ORDER — FLUDEOXYGLUCOSE F - 18 (FDG) INJECTION
7.0000 | Freq: Once | INTRAVENOUS | Status: AC
  Administered 2020-03-03: 7 via INTRAVENOUS

## 2020-03-05 NOTE — Progress Notes (Signed)
[place] OFFICE PROGRESS NOTE  Eber Hong, [place] [address]  DIAGNOSIS:  1) Metastatic non-small cell lung cancer, adenocarcinoma diagnosed in March 2012 with positive EGFR mutation in exon 21 ([id number]). 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 2) adenocarcinoma of the ascending colon (T2, N0, M0) with MSI high diagnosed in March 2017. 3) history of breast adenocarcinoma.  PDL1: 0%  PRIOR THERAPY: 1) Tarceva 150 mg by mouth daily status post 68 months of treatment. 2) Status post right colectomy with lymph node dissection in May 2017.  CURRENT THERAPY: 1) Tagrisso 80 mg by mouth daily started 11/09/2016.  Status post 39 months of treatment. 2) observation for the history of colon adenocarcinoma. 3) Femara 2.5 mg by mouth daily for history of breast cancer.  INTERVAL HISTORY: Anayelli Lai 79 y.o. female returns to clinic today for follow-up visit accompanied by her daughter, [name].  The patient was recently hospitalized from 02/09/20-02/19/2020.  The patient presented to the emergency room with a chief complaint of increased shortness of breath, weight loss, and generalized weakness.  She also had a temperature of 101. The patient was admitted to the hospital due to meeting the SIRS criteria.  The patient was found to have an acute PE.  She was also found to have a loculated pleural effusion.  The patient had a thoracentesis on 02/10/2020 which yielded 950 cc of bloody pleural fluid. The fluid was positive for malignant cells.  Cytology from her thoracentesis was also sent for PD-L1 testing as well as foundation 1 testing.  The patient had a thoracentesis at bedtime and had a challenging time this procedure secondary to pain.  Pulmonology evaluated the patient and recommended weekly chest x-rays to see if the pleural effusion was reaccumulating.  Pulmonology did not recommend a Pleurx catheter due to the patient having  debilitating pain from her thoracentesis.  She still has some occasional mild pain from this procedure and is inquiring whether she may take a Tylenol.  She has not had any of her weekly chest x-rays performed due to it not being arranged.  The patient recently underwent a restaging PET scan to further evaluate her condition.   Today, The patient is feeling better since being discharged from the hospital but she is still not at her baseline.  Her weakness was so significant when she was in the hospital that she had a challenging time ambulating.  Her family is helping her get stronger and helping her walk with a walker.  She reportedly has come along way since being discharged. She denies any fevers or chills.  She lost approximately 7lbs during the course of her hospitalization.  Her appetite was very poor when she was hospitalized and is slowly improving.  She denies any more chest pain.  She denies any significant cough and denies any hemoptysis.  She reports that her shortness of breath with exertion is improved since being discharged from the hospital.  She denies any nausea, vomiting, diarrhea, or constipation.  She denies any headache or visual changes.  The patient is here today for evaluation, to review the results of her PET scan, and to have a more detailed discussion regarding her current condition and treatment options.   MEDICAL HISTORY: Past Medical History:  Diagnosis Date  . Allergy   . Anemia   . Anemia of chronic disease 09/21/2016  . Anxiety   . Arthritis    arthritis- left hip  . Blood transfusion  without reported diagnosis    transfusion- 3-4 yrs ago -found to be anemic on routine lab check  . Breast cancer ([place]) 2002   bilateral- bilateral mastectomies done.  . 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 CVA (cerebral infarction) 04/30/2013  . Encounter for antineoplastic chemotherapy 10/28/2016  . Family history of brain cancer   . Family history of breast cancer   . Family history of  colon cancer 10/06/2009  . Family history of colon cancer   . Family history of prostate cancer   . GERD (gastroesophageal reflux disease)   . Glaucoma   . History of bilateral mastectomy 05/20/2010  . Hypertension   . Lung cancer ([place]) dx'd 12/2010   last Ct scan "no lung cancer" showing 11'16 CT Chest Epic.  . Malignant neoplasm of ascending colon ([place]) 01/29/2016  . Stroke [facility])    2 yrs ago-no residual  . Tubular adenoma of colon 07/2011   colon polyps ans reoccurence with malignancy found    ALLERGIES:  is allergic to doxycycline and sulfa antibiotics.  MEDICATIONS:  Current Outpatient Medications  Medication Sig Dispense Refill  . amiodarone (PACERONE) 200 MG tablet Take 1 tablet (200 mg total) by mouth daily. 90 tablet 0  . brimonidine-timolol (COMBIGAN) 0.2-0.5 % ophthalmic solution Place 1 drop into the right eye every 12 (twelve) hours.    . Cholecalciferol (VITAMIN D3) 50 MCG (2000 UT) capsule Take 1 capsule (2,000 Units total) by mouth daily. 90 capsule 0  . clopidogrel (PLAVIX) 75 MG tablet Take 75 mg by mouth daily.    . cycloSPORINE (RESTASIS) 0.05 % ophthalmic emulsion Place 1 drop into both eyes 2 (two) times daily.    Marland Kitchen escitalopram (LEXAPRO) 10 MG tablet Take 10 mg by mouth daily.     . famotidine (PEPCID) 40 MG tablet Take 40 mg by mouth daily.    . feeding supplement, ENSURE ENLIVE, (ENSURE ENLIVE) LIQD Take 237 mLs by mouth 2 (two) times daily between meals. 237 mL 12  . FeFum-FePoly-FA-B Cmp-C-Biot (FOLIVANE-PLUS) CAPS TAKE 1 CAPSULE BY MOUTH EVERY DAY IN THE MORNING (Patient taking differently: Take 1 capsule by mouth daily. ) 90 capsule 2  . furosemide (LASIX) 40 MG tablet Take 1 tablet (40 mg total) by mouth 2 (two) times daily. (Patient taking differently: Take 40 mg by mouth once. ) 180 tablet 0  . letrozole (FEMARA) 2.5 MG tablet TAKE 1 TABLET BY MOUTH EVERY DAY (Patient taking differently: Take 2.5 mg by mouth daily. ) 90 tablet 1  . Multiple Vitamins-Iron  (MULTIVITAMINS WITH IRON) TABS tablet Take 1 tablet by mouth daily.  0  . Multiple Vitamins-Minerals (PRESERVISION AREDS 2) CAPS Take by mouth.    Marland Kitchen osimertinib mesylate (TAGRISSO) 80 MG tablet Take 1 tablet (80 mg total) by mouth daily. 30 tablet 3  . prochlorperazine (COMPAZINE) 10 MG tablet Take 1 tablet (10 mg total) by mouth every 6 (six) hours as needed for nausea or vomiting. 30 tablet 0  . RIVAROXABAN (XARELTO) VTE STARTER PACK (15 & 20 MG TABLETS) Follow package directions: Take one 76m tablet by mouth twice a day. On day 22, switch to one 221mtablet once a day. Take with food. 51 each 0   Current Facility-Administered Medications  Medication Dose Route Frequency Provider Last Rate Last Admin  . 0.9 %  sodium chloride infusion  500 mL Intravenous Continuous Armbruster, StCarlota RaspberryMD        SURGICAL HISTORY:  Past Surgical History:  Procedure Laterality Date  .  APPENDECTOMY    . BOWEL DECOMPRESSION N/A 06/12/2018   Procedure: BOWEL DECOMPRESSION;  Surgeon: Rush Landmark Telford Nab., MD;  Location: [facility];  Service: Gastroenterology;  Laterality: N/A;  . cataracts Bilateral   . COLECTOMY WITH COLOSTOMY CREATION/HARTMANN PROCEDURE  06/16/2018   Procedure: CREATION/HARTMANN COLOSTOMY;  Surgeon: Michael Boston, MD;  Location: [facility];  Service: General;;  . COLONOSCOPY    . COLOSTOMY N/A 06/16/2018   Procedure: POSSIBLE COLOSTOMY;  Surgeon: Michael Boston, MD;  Location: [facility];  Service: General;  Laterality: N/A;  . EYE SURGERY    . FLEXIBLE SIGMOIDOSCOPY N/A 06/12/2018   Procedure: FLEXIBLE SIGMOIDOSCOPY;  Surgeon: Rush Landmark Telford Nab., MD;  Location: [facility];  Service: Gastroenterology;  Laterality: N/A;  . LAPAROSCOPIC SIGMOID COLECTOMY N/A 06/16/2018   Procedure: LAPAROSCOPIC LOW ANTERIOR RESECTION;  Surgeon: Michael Boston, MD;  Location: [facility];  Service: General;  Laterality: N/A;  . LUNG SURGERY     Resection -" not done. Pt . tx with Tarceva  . MASTECTOMY Bilateral    . MEDIASTINOSCOPY N/A 10/02/2016   Procedure: MEDIASTINOSCOPY;  Surgeon: [name], MD;  Location: [facility];  Service: Thoracic;  Laterality: N/A;  . RECONSTRUCTION BREAST W/ TRAM FLAP Bilateral    bilaterally  . robotic right hemicolectomy Right 03/06/2016   Dr. Johney Maine  . TEE WITHOUT CARDIOVERSION N/A 06/04/2013   Procedure: TRANSESOPHAGEAL ECHOCARDIOGRAM (TEE);  Surgeon: Jolaine Artist, MD;  Location: [facility] END[facility]COPY;  Service: Cardiovascular;  Laterality: N/A;  . TONSILLECTOMY    . TOTAL HIP ARTHROPLASTY Left 08/07/2016   Procedure: LEFT TOTAL HIP ARTHROPLASTY ANTERIOR APPROACH;  Surgeon: Gaynelle Arabian, MD;  Location: [facility];  Service: Orthopedics;  Laterality: Left;    REVIEW OF SYSTEMS:   Review of Systems  Constitutional:  Positive for fatigue, generalized weakness (improved from prior ), decreased appetite, and weight loss.  Negative for chills and fever  HENT: Negative for mouth sores, nosebleeds, sore throat and trouble swallowing.   Eyes: Negative for eye problems and icterus.  Respiratory: Positive for baseline dyspnea on exertion.  Negative for cough, hemoptysis,  and wheezing.   Cardiovascular: Negative for chest pain and leg swelling.  Gastrointestinal: Negative for abdominal pain, constipation, diarrhea, nausea and vomiting.  Genitourinary: Negative for bladder incontinence, difficulty urinating, dysuria, frequency and hematuria.   Musculoskeletal: Positive for improving back pain from recent thoracentesis.  Negative for gait problem, neck pain and neck stiffness.  Skin: Negative for itching and rash.  Neurological: Negative for dizziness, extremity weakness, gait problem, headaches, light-headedness and seizures.  Hematological: Negative for adenopathy. Does not bruise/bleed easily.  Psychiatric/Behavioral: Negative for confusion, depression and sleep disturbance. The patient is not nervous/anxious.     PHYSICAL EXAMINATION:  Blood pressure 118/60, pulse 77,  temperature 98.3 F (36.8 C), temperature source Temporal, resp. rate 18, height 5' 6"  (1.676 m), weight 121 lb 14.4 oz (55.3 kg), SpO2 97 %.  ECOG PERFORMANCE STATUS: 1 - Symptomatic but completely ambulatory  Physical Exam  Constitutional: Oriented to person, place, and time and elderly appearing female and in no distress.  HENT:  Head: Normocephalic and atraumatic.  Mouth/Throat: Oropharynx is clear and moist. No oropharyngeal exudate.  Eyes: Conjunctivae are normal. Right eye exhibits no discharge. Left eye exhibits no discharge. No scleral icterus.  Neck: Normal range of motion. Neck supple.  Cardiovascular: Normal rate, regular rhythm, normal heart sounds and intact distal pulses.  On oxygen via nasal cannula Pulmonary/Chest: Effort normal.  Quiet breath sounds in right lung base. no respiratory  distress. No wheezes. No rales.  Abdominal: Soft. Bowel sounds are normal. Exhibits no distension and no mass. There is no tenderness.  Musculoskeletal: Normal range of motion. Exhibits no edema.  Lymphadenopathy:    No cervical adenopathy.  Neurological: Alert and oriented to person, place, and time. Exhibits normal muscle tone.  Examined in the wheelchair Skin: Skin is warm and dry. No rash noted. Not diaphoretic. No erythema. No pallor.  Psychiatric: Mood, memory and judgment normal.  Vitals reviewed.  LABORATORY DATA: Lab Results  Component Value Date   WBC 6.2 03/06/2020   HGB 9.4 (L) 03/06/2020   HCT 30.8 (L) 03/06/2020   MCV 90.3 03/06/2020   PLT 467 (H) 03/06/2020      Chemistry      Component Value Date/Time   NA 140 03/06/2020 1432   NA 138 09/26/2017 0942   K 4.0 03/06/2020 1432   K 5.2 No visable hemolysis (H) 09/26/2017 0942   CL 99 03/06/2020 1432   CL 107 04/05/2013 0754   CO2 32 03/06/2020 1432   CO2 26 09/26/2017 0942   BUN 19 03/06/2020 1432   BUN 16.0 09/26/2017 0942   CREATININE 1.14 (H) 03/06/2020 1432   CREATININE 1.2 (H) 09/26/2017 0942       Component Value Date/Time   CALCIUM 9.5 03/06/2020 1432   CALCIUM 9.9 09/26/2017 0942   ALKPHOS 149 (H) 03/06/2020 1432   ALKPHOS 68 09/26/2017 0942   AST 24 03/06/2020 1432   AST 18 09/26/2017 0942   ALT 38 03/06/2020 1432   ALT 13 09/26/2017 0942   BILITOT 0.2 (L) 03/06/2020 1432   BILITOT 0.31 09/26/2017 0942       RADIOGRAPHIC STUDIES:  CT ANGIO CHEST PE W OR WO CONTRAST  Result Date: 02/09/2020 CLINICAL DATA:  Shortness of breath, weakness, left upper lobe lung cancer EXAM: CT ANGIOGRAPHY CHEST WITH CONTRAST TECHNIQUE: Multidetector CT imaging of the chest was performed using the standard protocol during bolus administration of intravenous contrast. Multiplanar CT image reconstructions and MIPs were obtained to evaluate the vascular anatomy. CONTRAST:  70m OMNIPAQUE IOHEXOL 350 MG/ML SOLN COMPARISON:  12/24/2019 FINDINGS: Cardiovascular: This is a technically adequate evaluation of the pulmonary vasculature. There are segmental right lower lobe pulmonary emboli. Clot burden is minimal. No evidence of right heart strain. No pericardial effusion. Thoracic aorta is normal in caliber with no aneurysm or dissection. Mediastinum/Nodes: No pathologic adenopathy. There is a large hiatal hernia. Esophagus, trachea, and thyroid are stable. Lungs/Pleura: Since the previous exam, there has been significant enlargement of the left pleural effusion now measuring greater than 1 L in volume. The effusion is partially loculated, with continued areas of pleural thickening. There is dense left lower lobe consolidation consistent with atelectasis. There is a trace right pleural effusion. Patchy hypoventilatory changes are seen at the right lung base. No pneumothorax. Central airways patent. Upper Abdomen: Stable right lobe liver cyst. Remainder of the upper abdomen is unremarkable. Musculoskeletal: No acute or destructive bony lesions. Reconstructed images demonstrate no additional findings. Review of the MIP  images confirms the above findings. IMPRESSION: 1. Right lower lobe segmental pulmonary emboli. Clot burden is minimal. 2. Significant interval enlargement of the partially loculated left pleural effusion, now measuring greater than 1 L in volume. Continued areas of pleural thickening suspicious for recurrent disease. 3. Trace right pleural effusion. 4. Hiatal hernia. These results were called by telephone at the time of interpretation on 02/09/2020 at 11:40 pm [facility] who verbally acknowledged  these results. Electronically Signed   By: Randa Ngo M.D.   On: 02/09/2020 23:40   MR Brain W Wo Contrast  Result Date: 02/15/2020 CLINICAL DATA:  Non-small cell lung cancer.  Staging. EXAM: MRI HEAD WITHOUT AND WITH CONTRAST TECHNIQUE: Multiplanar, multiecho pulse sequences of the brain and surrounding structures were obtained without and with intravenous contrast. CONTRAST:  83m GADAVIST GADOBUTROL 1 MMOL/ML IV SOLN COMPARISON:  04/30/2013 FINDINGS: Brain: Diffusion imaging does not show any acute or subacute infarction. No abnormality affects the brainstem or cerebellum. Cerebral hemispheres show minimal small vessel change of the white matter, less than usually seen at this age. No cortical or large vessel territory infarction. No mass lesion, hemorrhage, hydrocephalus or extra-axial collection. Incidental venous angioma left frontal lobe. Vascular: Major vessels at the base of the brain show flow. Skull and upper cervical spine: No evidence of metastatic disease. 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 Sinuses/Orbits: Clear/normal Other: None IMPRESSION: No evidence of metastatic disease. Minimal small vessel change of the white matter, less than often seen at this age. Left parietal calvarial hemangioma, unchanged since 2014. No evidence of skull or skull base metastatic disease. Electronically Signed   By: MNelson ChimesM.D.   On: 02/15/2020 14:30   NM PET Image Restag  (PS) Skull Base To Thigh  Result Date: 03/05/2020 CLINICAL DATA:  Subsequent treatment strategy for non-small cell lung cancer. Recent left-sided thoracentesis showing malignant cells. EXAM: NUCLEAR MEDICINE PET SKULL BASE TO THIGH TECHNIQUE: 7.0 mCi F-18 FDG was injected intravenously. Full-ring PET imaging was performed from the skull base to thigh after the radiotracer. CT data was obtained and used for attenuation correction and anatomic localization. Fasting blood glucose: 113 mg/dl COMPARISON:  PET-CT 09/20/2016.  Chest CT 02/09/2020 FINDINGS: Mediastinal blood pool activity: SUV max 2.85 Liver activity: SUV max NA NECK: There is a new hypermetabolic left thyroid lobe lesion with SUV max of 7.0. It measures 16 mm. Recommend thyroid ultrasound and biopsy. No neck adenopathy. Incidental CT findings: none CHEST: The left pleural effusion is smaller status post prior thoracentesis. Ill-defined pleural and subpleural soft tissue density and probable patchy overlying atelectasis. Areas of hypermetabolism are noted along the pleural surface with SUV max ranging from 4.0 to 4.88. Findings certainly worrisome for pleural metastatic disease, particularly given the thoracentesis results. Large area of ground-glass opacity in the right upper lobe peripherally is likely inflammatory or possibly infectious. This is new since the prior chest CT from April. Low level hypermetabolism with SUV max of 2.55. There is an associated moderate-sized right pleural effusion which was not present on the recent chest CT. No enlarged or hypermetabolic mediastinal or hilar lymph nodes. Incidental CT findings: Stable moderate to large hiatal hernia. ABDOMEN/PELVIS: No abnormal hypermetabolic activity within the liver, pancreas, adrenal glands, or spleen. No hypermetabolic lymph nodes in the abdomen or pelvis. Incidental CT findings: Simple right hepatic lobe cyst is noted. Surgical changes involving the right colon and left lower quadrant  colostomy. Stable aortic and iliac artery calcifications. Stable numerous small calcified uterine fibroids. SKELETON: No findings suspicious for osseous metastatic disease. Incidental CT findings: none IMPRESSION: 1. Low level areas of hypermetabolism involving the left pleura consistent with pleural metastatic disease. 2. No worrisome pulmonary nodules or lesions and no mediastinal or hilar lymphadenopathy. 3. Right upper lobe inflammatory or infectious process and moderate-sized right pleural effusion. 4. No findings suspicious for abdominal/pelvic metastatic disease or osseous metastatic disease. 5. 16 mm hypermetabolic left thyroid lesion. Recommend thyroid [place]  and biopsy. (Ref: J Am Coll Radiol. 2015 Feb;12(2): 143-50). Electronically Signed   By: Marijo Sanes M.D.   On: 03/05/2020 09:16   DG Chest Port 1 View  Result Date: 02/15/2020 CLINICAL DATA:  Pleural effusions. EXAM: PORTABLE CHEST 1 VIEW COMPARISON:  Chest x-ray 02/12/2020, 02/11/2020. FINDINGS: Cardiomegaly. Diffuse bilateral pulmonary infiltrates/edema, progressed from prior exam. CHF could present this fashion. Bilateral pneumonia cannot be excluded. Bilateral pleural effusions again noted. No pneumothorax. Surgical sutures noted over the left upper chest. Surgical clips noted over the right chest. No acute bony abnormality identified. IMPRESSION: Cardiomegaly. Diffuse progressive bilateral pulmonary infiltrates/edema. Bilateral pleural effusions again noted. Electronically Signed   By: Marcello Moores  Register   On: 02/15/2020 09:05   DG CHEST PORT 1 VIEW  Result Date: 02/12/2020 CLINICAL DATA:  Shortness of breath EXAM: PORTABLE CHEST 1 VIEW COMPARISON:  02/11/2020 and 02/10/2020 FINDINGS: Cardiomediastinal contours remain enlarged. Bilateral effusions similar to prior study with persistent LEFT lower lobe airspace disease and obscured LEFT hemidiaphragm. Increased interstitial markings persist. No acute bone process. IMPRESSION: No interval  change in appearance of the chest since prior study. Bilateral effusions with LEFT lower lobe airspace disease. Persistent increased interstitial markings cardiomegaly. Electronically Signed   By: Zetta Bills M.D.   On: 02/12/2020 18:18   DG Chest Port 1 View  Result Date: 02/11/2020 CLINICAL DATA:  Respiratory failure EXAM: PORTABLE CHEST 1 VIEW COMPARISON:  02/10/2020 FINDINGS: Cardiomegaly with vascular congestion. Small left pleural effusion. Left lower lobe atelectasis. Bilateral interstitial prominence may reflect mild interstitial edema. IMPRESSION: Cardiomegaly, suspect mild interstitial edema. Small left effusion with left base atelectasis. Electronically Signed   By: Rolm Baptise M.D.   On: 02/11/2020 02:22   DG CHEST PORT 1 VIEW  Result Date: 02/10/2020 CLINICAL DATA:  Post left thoracentesis EXAM: PORTABLE CHEST 1 VIEW COMPARISON:  02/09/2020 FINDINGS: Interval decrease in the left effusion following thoracentesis. Small left effusion remains. No pneumothorax. Left base atelectasis. Mild cardiomegaly. Right lung clear. IMPRESSION: Decreasing left effusion following thoracentesis.  No pneumothorax. Small left effusion and left base atelectasis. Electronically Signed   By: Rolm Baptise M.D.   On: 02/10/2020 19:13   DG Chest Port 1 View  Result Date: 02/09/2020 CLINICAL DATA:  Short of breath EXAM: PORTABLE CHEST 1 VIEW COMPARISON:  06/24/2018, CT 12/24/2019 FINDINGS: Postoperative changes in the left upper lung. Moderate left pleural effusion increased compared to prior. Probable trace right effusion. Airspace disease at the left base. Obscured cardiomediastinal silhouette. Hiatal hernia. Convex opacity over the lower mediastinal silhouette. IMPRESSION: 1. Postsurgical changes of the left chest. Moderate left pleural effusion, increased compared to prior. Probable trace right pleural effusion. Airspace disease at the left lung base. Electronically Signed   By: Donavan Foil M.D.   On:  02/09/2020 18:07   ECHOCARDIOGRAM COMPLETE  Result Date: 02/10/2020    ECHOCARDIOGRAM REPORT   Patient Name:   OONA TRAMMEL Date of Exam: 02/10/2020 Medical Rec #:  [id number]       Height:       66.0 in Accession #:    [id number]      Weight:       127.9 lb Date of Birth:  [birth year]       BSA:          1.654 m Patient Age:    38 years        BP:           111/58 mmHg Patient Gender: F  HR:           88 bpm. Exam Location:  Inpatient Procedure: 2D Echo and Intracardiac Opacification Agent Indications:    Pulmonary Embolus 415.19 / I26.99  History:        Patient has prior history of Echocardiogram examinations, most                 recent 02/01/2016. Risk Factors:Hypertension. CKD                 Pleural effusion.  Sonographer:    Vikki Ports Turrentine Referring Phys: 57 Rise Patience  Sonographer Comments: Technically difficult study due to poor echo windows. Image acquisition challenging due to patient body habitus. IMPRESSIONS  1. Left ventricular ejection fraction, by estimation, is 50 to 55%. The left ventricle has low normal function. The left ventricle has no regional wall motion abnormalities. Left ventricular diastolic parameters are consistent with Grade I diastolic dysfunction (impaired relaxation).  2. Right ventricular systolic function is moderately reduced. The right ventricular size is moderately enlarged. D-shaped septum suggestive of RV pressure/volume overload. No complete TR doppler jet so unable to estimate PA systolic pressure.  3. The aortic valve is tricuspid. Aortic valve regurgitation is trivial. No aortic stenosis is present.  4. The mitral valve is normal in structure. No evidence of mitral valve regurgitation. No evidence of mitral stenosis.  5. The inferior vena cava is dilated in size with <50% respiratory variability, suggesting right atrial pressure of 15 mmHg.  6. Left-sided pleural effusion noted. Trivial pericardial effusion. FINDINGS  Left Ventricle: Left  ventricular ejection fraction, by estimation, is 50 to 55%. The left ventricle has low normal function. The left ventricle has no regional wall motion abnormalities. Definity contrast agent was given IV to delineate the left ventricular endocardial borders. The left ventricular internal cavity size was normal in size. There is no left ventricular hypertrophy. Left ventricular diastolic parameters are consistent with Grade I diastolic dysfunction (impaired relaxation). Right Ventricle: The right ventricular size is moderately enlarged. No increase in right ventricular wall thickness. Right ventricular systolic function is moderately reduced. Tricuspid regurgitation signal is inadequate for assessing PA pressure. Left Atrium: Left atrial size was normal in size. Right Atrium: Right atrial size was normal in size. Pericardium: Trivial pericardial effusion is present. Mitral Valve: The mitral valve is normal in structure. No evidence of mitral valve regurgitation. No evidence of mitral valve stenosis. Tricuspid Valve: The tricuspid valve is normal in structure. Tricuspid valve regurgitation is not demonstrated. Aortic Valve: The aortic valve is tricuspid. Aortic valve regurgitation is trivial. No aortic stenosis is present. Pulmonic Valve: The pulmonic valve was normal in structure. Pulmonic valve regurgitation is not visualized. Aorta: The aortic root is normal in size and structure. Venous: The inferior vena cava is dilated in size with less than 50% respiratory variability, suggesting right atrial pressure of 15 mmHg. IAS/Shunts: No atrial level shunt detected by color flow Doppler.  LEFT VENTRICLE PLAX 2D LVOT diam:     1.80 cm  Diastology LV SV:         36       LV e' lateral:   9.36 cm/s LV SV Index:   22       LV E/e' lateral: 3.6 LVOT Area:     2.54 cm LV e' medial:    7.83 cm/s                         LV  E/e' medial:  4.3  LEFT ATRIUM           Index LA Vol (A4C): 36.2 ml 21.89 ml/m  AORTIC VALVE LVOT Vmax:    84.90 cm/s LVOT Vmean:  61.100 cm/s LVOT VTI:    0.141 m MITRAL VALVE MV Area (PHT): 3.37 cm    SHUNTS MV Decel Time: 225 msec    Systemic VTI:  0.14 m MV E velocity: 33.40 cm/s  Systemic Diam: 1.80 cm MV A velocity: 59.70 cm/s MV E/A ratio:  0.56 Loralie Champagne MD Electronically signed by Loralie Champagne MD Signature Date/Time: 02/10/2020/4:49:30 PM    Final    CT RENAL STONE STUDY  Result Date: 02/09/2020 CLINICAL DATA:  Flank pain, kidney stone suspected. EXAM: CT ABDOMEN AND PELVIS WITHOUT CONTRAST TECHNIQUE: Multidetector CT imaging of the abdomen and pelvis was performed following the standard protocol without IV contrast. COMPARISON:  Abdominal CT 12/24/2019. Concurrent chest CTA, reported separately. FINDINGS: Lower chest: Left pleural effusion and basilar consolidation assessed on concurrent chest CT, reported separately. Moderately large hiatal hernia, partially obscured by motion artifact. Hepatobiliary: Cyst in the right lobe of the liver is grossly stable, motion artifact partially obscures evaluation. There is some high-density material within the gallbladder that may represent sludge. More detailed evaluation is limited given motion. No biliary dilatation. Pancreas: Motion artifact through the pancreas limits assessment. No ductal dilatation. No obvious peripancreatic inflammation Spleen: Normal in size with small cleft medially. Adrenals/Urinary Tract: No adrenal nodule. No hydronephrosis. No renal or ureteral calculi. Mild bilateral renal parenchymal thinning and cortical scarring in the lower right kidney. There is symmetric bilateral perinephric edema. Urinary bladder is unremarkable, no bladder stone. Stomach/Bowel: Moderately large hiatal hernia. Motion limits more detailed assessment. No evidence of bowel obstruction. No obvious bowel inflammation, lack of contrast and motion limits detailed bowel assessment. Descending colostomy. Mild descending colonic diverticulosis without diverticulitis  chain sutures noted in the cecum. Vascular/Lymphatic: Aorto bi-iliac atherosclerosis. No grossly enlarged abdominopelvic lymph nodes, detailed assessment limited given motion and lack of contrast. Reproductive: Calcified uterine fibroids. No adnexal mass. Other: Mild generalized body wall edema. No ascites. No free air. Musculoskeletal: Left hip arthroplasty. Bones are under mineralized. Stable bone island in the mid sacrum. Multilevel degenerative change in the lumbar spine. IMPRESSION: 1. Motion limited exam. 2. No renal stones or obstructive uropathy. Symmetric bilateral perinephric edema is nonspecific. 3. Moderately large hiatal hernia. 4. High-density material within the gallbladder may represent sludge. More detailed evaluation is limited given motion artifact. Aortic Atherosclerosis (ICD10-I70.0). Electronically Signed   By: Keith Rake M.D.   On: 02/09/2020 23:30   VAS [place] LOWER EXTREMITY VENOUS (DVT)  Result Date: 02/11/2020  Lower Venous DVTStudy Indications: Pulmonary embolism.  Performing Technologist: June Leap RDMS, RVT  Examination Guidelines: A complete evaluation includes B-mode imaging, spectral Doppler, color Doppler, and power Doppler as needed of all accessible portions of each vessel. Bilateral testing is considered an integral part of a complete examination. Limited examinations for reoccurring indications may be performed as noted. The reflux portion of the exam is performed with the patient in reverse Trendelenburg.  +---------+---------------+---------+-----------+----------+--------------+ RIGHT    CompressibilityPhasicitySpontaneityPropertiesThrombus Aging +---------+---------------+---------+-----------+----------+--------------+ CFV      Full           Yes      Yes                                 +---------+---------------+---------+-----------+----------+--------------+ SFJ  Full                                                         +---------+---------------+---------+-----------+----------+--------------+ FV Prox  Full                                                        +---------+---------------+---------+-----------+----------+--------------+ FV Mid   Full                                                        +---------+---------------+---------+-----------+----------+--------------+ FV DistalFull                                                        +---------+---------------+---------+-----------+----------+--------------+ PFV      Full                                                        +---------+---------------+---------+-----------+----------+--------------+ POP      Full           Yes      Yes                                 +---------+---------------+---------+-----------+----------+--------------+ PTV      Full                                                        +---------+---------------+---------+-----------+----------+--------------+ PERO     Full                                                        +---------+---------------+---------+-----------+----------+--------------+   +---------+---------------+---------+-----------+----------+--------------+ LEFT     CompressibilityPhasicitySpontaneityPropertiesThrombus Aging +---------+---------------+---------+-----------+----------+--------------+ CFV      Full           Yes      Yes                                 +---------+---------------+---------+-----------+----------+--------------+ SFJ      Full                                                        +---------+---------------+---------+-----------+----------+--------------+  FV Prox  Full                                                        +---------+---------------+---------+-----------+----------+--------------+ FV Mid   Full                                                         +---------+---------------+---------+-----------+----------+--------------+ FV DistalFull                                                        +---------+---------------+---------+-----------+----------+--------------+ PFV      Full                                                        +---------+---------------+---------+-----------+----------+--------------+ POP      Full           Yes      Yes                                 +---------+---------------+---------+-----------+----------+--------------+ PTV      None                                                        +---------+---------------+---------+-----------+----------+--------------+ PERO     None                                                        +---------+---------------+---------+-----------+----------+--------------+     Summary: RIGHT: - There is no evidence of deep vein thrombosis in the lower extremity.  - No cystic structure found in the popliteal fossa.  LEFT: - Findings consistent with acute deep vein thrombosis involving the left posterior tibial veins, and left peroneal veins. - No cystic structure found in the popliteal fossa.  *See table(s) above for measurements and observations. Electronically signed by Harold Barban MD on 02/11/2020 at 5:06:34 PM.    Final      ASSESSMENT/PLAN:  This is a very pleasant 79 year old Caucasian female with metastatic non-small cell lung cancer, adenocarcinoma.  She was diagnosed in March 2012 with a positive EGFR mutation.  She is status post 68 months of treatment with Tarceva.  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  The patient's treatment was then switched to Tagrisso 80 mg p.o. daily.  She is status post 39 months of treatment.  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  The  patient recently had a restaging PET scan performed to further evaluate her condition.  The patient was seen with Dr. Julien Nordmann.  Dr. Julien Nordmann personally and independently reviewed her scan which demonstrated low level areas of hypermetabolism involving the left pleura consistent with pleural metastatic disease.  The scan also noted a moderately sized right pleural effusion.  The scan also noted a hypermetabolic left thyroid nodule.  Dr. Julien Nordmann had a lengthy discussion with the patient and her daughter about her current condition and recommendations.  Foundation 1 and PD-L1 testing was performed which noted a PD-L1 expression of 0%.  The patient's foundation 1 testing did not show any new actionable mutations besides the previously noted EGFR mutation.  Interestingly, the patient's foundation 1 did not show the EGFR T790 M resistant mutation which Dr. Julien Nordmann believes is secondary to the [facility] working on this mutation.   Dr. Julien Nordmann recommends keeping the patient on the same treatment with Tagrisso at the same dose with close monitoring of her condition.  If Dr. Julien Nordmann were to change treatment, treatment would be need to be changed to chemotherapy which may be challenging for the patient given her recent hospitalization.  We will continue to monitor her condition closely and follow-up with her regularly.  Regarding the moderately sized pleural effusion, Dr. Julien Nordmann recommends that the patient be referred to Dr. Roxan Hockey, who the patient has seen in the past, for consideration of a Pleurx catheter.  The placement of a Pleurx catheter would be under anesthesia which would be easier for the patient.  Regarding the incidental thyroid nodule, I will arrange for the patient to have a ultrasound of the thyroid and fine-needle aspiration to further evaluate this lesion.  We will see the patient back for follow-up visit in 1 month for evaluation and repeat blood work.  Discussed with the patient may use  Tylenol as needed for pain.  Regarding the patient's weight loss, the patient was strongly encouraged to increase her caloric intake and to increase her nutritional supplements.   For the history of colon cancer she is currently on observation and no evidence of recurrence. For the history of breast cancer she will continue her current treatment with Femara.  The patient was advised to call immediately if she has any concerning symptoms in the interval. The patient voices understanding of current disease status and treatment options and is in agreement with the current care plan. All questions were answered. The patient knows to call the clinic with any problems, questions or concerns. We can certainly see the patient much sooner if necessary      Orders Placed This Encounter  Procedures  . US THYROID    Standing Status:   Future    Standing Expiration Date:   05/06/2021    Order Specific Question:   Reason for Exam (SYMPTOM  OR DIAGNOSIS REQUIRED)    Answer:   To further evaluate hypermetabolic lesion in the thyroid which was seen on the recent PET scan    Order Specific Question:   Preferred imaging location?    Answer:   [facility]  . [place] FNA BX THYROID 1ST LESION AFIRMA    Standing Status:   Future    Standing Expiration Date:   05/06/2021    Order Specific Question:   Reason for Exam (SYMPTOM  OR DIAGNOSIS REQUIRED)    Answer:   Hypermetabolic activity  noted on recent PET scan.    Order Specific Question:   Preferred location?    Answer:   [facility]  . CBC with Differential ([place] Only)    Standing Status:   Future    Standing Expiration Date:   03/06/2021  . CMP ([place] only)    Standing Status:   Future    Standing Expiration Date:   03/06/2021  . Ambulatory referral to Cardiothoracic Surgery    Referral Priority:   Routine    Referral Type:   Surgical    Referral Reason:   Specialty Services Required    Requested Specialty:    Cardiothoracic Surgery    Number of Visits Requested:   [place], PA-C 03/06/20  ADDENDUM: Hematology/Oncology Attending: I had a face-to-face encounter with the patient today.  I recommended her care plan.  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  She was recently found to have bilateral pleural effusion left more than right.  She was admitted to the hospital recently with failure to thrive as well as generalized fatigue and weakness.  She underwent left sided thoracentesis and the cytology was positive for malignant cells.  The tissue block was tested for molecular study and showed the original EGFR exon 21 ([id number]) mutation.  I did not show the resistant T790M mutation.  The patient was also tested for PD-L1 expression and that was negative.  The patient also had a PET scan that showed faint metabolic activity in the pleural-based densities suspicious for pleural metastasis.  She also had a nodule in the left thyroid gland that is hypermetabolic. I recommended for the patient to continue her current treatment with Tagrisso for now. For the enlarging right pleural effusion, I will send the patient to Dr. Roxan Hockey for consideration of Pleurx catheter placement. For the hypermetabolic left thyroid nodule, we will send the patient for ultrasound of the thyroid as well as fine-needle aspiration if suspicious. We will see the patient back for follow-up visit in 1 months for evaluation and repeat blood work. If she has any further evidence of disease progression in the future imaging studies, I would consider changing her treatment to systemic chemotherapy. For the malnutrition I strongly  encouraged the patient to increase her nutritional supplements and eat more frequent meals. She was advised to call immediately if she has any concerning symptoms in the interval.  Disclaimer: This note was dictated with voice recognition software. Similar sounding words can inadvertently be transcribed and may be missed upon review. Eilleen Kempf, MD 03/06/20

## 2020-03-06 ENCOUNTER — Encounter: Payer: Self-pay | Admitting: Physician Assistant

## 2020-03-06 ENCOUNTER — Inpatient Hospital Stay: Payer: Medicare Other

## 2020-03-06 ENCOUNTER — Other Ambulatory Visit: Payer: Self-pay

## 2020-03-06 ENCOUNTER — Inpatient Hospital Stay: Payer: Medicare Other | Attending: Internal Medicine | Admitting: Physician Assistant

## 2020-03-06 ENCOUNTER — Telehealth: Payer: Self-pay | Admitting: Internal Medicine

## 2020-03-06 VITALS — BP 118/60 | HR 77 | Temp 98.3°F | Resp 18 | Ht 66.0 in | Wt 121.9 lb

## 2020-03-06 DIAGNOSIS — Z800 Family history of malignant neoplasm of digestive organs: Secondary | ICD-10-CM | POA: Insufficient documentation

## 2020-03-06 DIAGNOSIS — Z79899 Other long term (current) drug therapy: Secondary | ICD-10-CM | POA: Diagnosis not present

## 2020-03-06 DIAGNOSIS — C50919 Malignant neoplasm of unspecified site of unspecified female breast: Secondary | ICD-10-CM | POA: Insufficient documentation

## 2020-03-06 DIAGNOSIS — Z933 Colostomy status: Secondary | ICD-10-CM | POA: Insufficient documentation

## 2020-03-06 DIAGNOSIS — I10 Essential (primary) hypertension: Secondary | ICD-10-CM | POA: Insufficient documentation

## 2020-03-06 DIAGNOSIS — C3490 Malignant neoplasm of unspecified part of unspecified bronchus or lung: Secondary | ICD-10-CM

## 2020-03-06 DIAGNOSIS — Z8673 Personal history of transient ischemic attack (TIA), and cerebral infarction without residual deficits: Secondary | ICD-10-CM | POA: Diagnosis not present

## 2020-03-06 DIAGNOSIS — Z803 Family history of malignant neoplasm of breast: Secondary | ICD-10-CM | POA: Diagnosis not present

## 2020-03-06 DIAGNOSIS — E041 Nontoxic single thyroid nodule: Secondary | ICD-10-CM | POA: Diagnosis not present

## 2020-03-06 DIAGNOSIS — Z9013 Acquired absence of bilateral breasts and nipples: Secondary | ICD-10-CM | POA: Diagnosis not present

## 2020-03-06 DIAGNOSIS — C3412 Malignant neoplasm of upper lobe, left bronchus or lung: Secondary | ICD-10-CM | POA: Diagnosis present

## 2020-03-06 DIAGNOSIS — D638 Anemia in other chronic diseases classified elsewhere: Secondary | ICD-10-CM | POA: Diagnosis not present

## 2020-03-06 DIAGNOSIS — Z79811 Long term (current) use of aromatase inhibitors: Secondary | ICD-10-CM | POA: Diagnosis not present

## 2020-03-06 DIAGNOSIS — Z170 Estrogen receptor positive status [ER+]: Secondary | ICD-10-CM | POA: Insufficient documentation

## 2020-03-06 DIAGNOSIS — J910 Malignant pleural effusion: Secondary | ICD-10-CM | POA: Diagnosis not present

## 2020-03-06 DIAGNOSIS — Z8042 Family history of malignant neoplasm of prostate: Secondary | ICD-10-CM | POA: Diagnosis not present

## 2020-03-06 DIAGNOSIS — Z8249 Family history of ischemic heart disease and other diseases of the circulatory system: Secondary | ICD-10-CM | POA: Insufficient documentation

## 2020-03-06 DIAGNOSIS — Z85038 Personal history of other malignant neoplasm of large intestine: Secondary | ICD-10-CM | POA: Insufficient documentation

## 2020-03-06 DIAGNOSIS — Z86711 Personal history of pulmonary embolism: Secondary | ICD-10-CM | POA: Insufficient documentation

## 2020-03-06 DIAGNOSIS — Z7901 Long term (current) use of anticoagulants: Secondary | ICD-10-CM | POA: Insufficient documentation

## 2020-03-06 DIAGNOSIS — C3491 Malignant neoplasm of unspecified part of right bronchus or lung: Secondary | ICD-10-CM

## 2020-03-06 DIAGNOSIS — F419 Anxiety disorder, unspecified: Secondary | ICD-10-CM | POA: Insufficient documentation

## 2020-03-06 DIAGNOSIS — Z7189 Other specified counseling: Secondary | ICD-10-CM | POA: Diagnosis not present

## 2020-03-06 LAB — CMP (CANCER CENTER ONLY)
ALT: 38 U/L (ref 0–44)
AST: 24 U/L (ref 15–41)
Albumin: 2.6 g/dL — ABNORMAL LOW (ref 3.5–5.0)
Alkaline Phosphatase: 149 U/L — ABNORMAL HIGH (ref 38–126)
Anion gap: 9 (ref 5–15)
BUN: 19 mg/dL (ref 8–23)
CO2: 32 mmol/L (ref 22–32)
Calcium: 9.5 mg/dL (ref 8.9–10.3)
Chloride: 99 mmol/L (ref 98–111)
Creatinine: 1.14 mg/dL — ABNORMAL HIGH (ref 0.44–1.00)
GFR, Est AFR Am: 53 mL/min — ABNORMAL LOW (ref >60)
GFR, Estimated: 45 mL/min — ABNORMAL LOW (ref >60)
Glucose, Bld: 117 mg/dL — ABNORMAL HIGH (ref 70–99)
Potassium: 4 mmol/L (ref 3.5–5.1)
Sodium: 140 mmol/L (ref 135–145)
Total Bilirubin: 0.2 mg/dL — ABNORMAL LOW (ref 0.3–1.2)
Total Protein: 6.5 g/dL (ref 6.5–8.1)

## 2020-03-06 LAB — CBC WITH DIFFERENTIAL (CANCER CENTER ONLY)
Abs Immature Granulocytes: 0.02 10*3/uL (ref 0.00–0.07)
Basophils Absolute: 0 10*3/uL (ref 0.0–0.1)
Basophils Relative: 1 %
Eosinophils Absolute: 0.1 10*3/uL (ref 0.0–0.5)
Eosinophils Relative: 2 %
HCT: 30.8 % — ABNORMAL LOW (ref 36.0–46.0)
Hemoglobin: 9.4 g/dL — ABNORMAL LOW (ref 12.0–15.0)
Immature Granulocytes: 0 %
Lymphocytes Relative: 16 %
Lymphs Abs: 1 10*3/uL (ref 0.7–4.0)
MCH: 27.6 pg (ref 26.0–34.0)
MCHC: 30.5 g/dL (ref 30.0–36.0)
MCV: 90.3 fL (ref 80.0–100.0)
Monocytes Absolute: 1 10*3/uL (ref 0.1–1.0)
Monocytes Relative: 15 %
Neutro Abs: 4.1 10*3/uL (ref 1.7–7.7)
Neutrophils Relative %: 66 %
Platelet Count: 467 10*3/uL — ABNORMAL HIGH (ref 150–400)
RBC: 3.41 MIL/uL — ABNORMAL LOW (ref 3.87–5.11)
RDW: 15.3 % (ref 11.5–15.5)
WBC Count: 6.2 10*3/uL (ref 4.0–10.5)
nRBC: 0 % (ref 0.0–0.2)

## 2020-03-06 NOTE — Telephone Encounter (Signed)
Scheduled per 5/17 LOS. Printed AVS and calendar for pt.

## 2020-03-10 ENCOUNTER — Other Ambulatory Visit: Payer: Self-pay

## 2020-03-10 ENCOUNTER — Ambulatory Visit (HOSPITAL_COMMUNITY)
Admission: RE | Admit: 2020-03-10 | Discharge: 2020-03-10 | Disposition: A | Discharge status: Home / Self Care | Payer: Medicare Other | Source: Ambulatory Visit | Attending: Physician Assistant | Admitting: Physician Assistant

## 2020-03-10 ENCOUNTER — Other Ambulatory Visit: Payer: Self-pay | Admitting: Physician Assistant

## 2020-03-10 DIAGNOSIS — C3491 Malignant neoplasm of unspecified part of right bronchus or lung: Secondary | ICD-10-CM | POA: Diagnosis present

## 2020-03-10 DIAGNOSIS — E041 Nontoxic single thyroid nodule: Secondary | ICD-10-CM

## 2020-03-12 NOTE — Progress Notes (Signed)
Cardiology Office Note  Date: 03/13/2020   ID: Kristin [name], DOB 08/15/1941, MRN [id number]  PCP:  Eber Hong, MD  Cardiologist:  Kate Sable, MD Electrophysiologist:  None   Chief Complaint: F/U HTN, Hx of CVA, atrial fibrillation  History of Present Illness: Kristin [name] is a 79 y.o. 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   Last visit 01/29/2018 with Dr. Bronson Ing for preop clearance for surgery. She was not very active due to her medications causing her to feel weak and hip arthritis. Having some mild shortness of breath on climbing steps but was able to do so. An echocardiogram was ordered. Blood pressure was well controlled.  Patient states she has been feeling pretty tired recently.  She has metastatic non-small cell lung cancer and adenocarcinoma of lung requiring resection with colostomy.  She is recently had mild malignant pleural effusion with a thoracentesis with 1 L of fluid removed.  During this procedure she went into atrial fibrillation was started on amiodarone.  She is currently on Xarelto 20 mg daily for DVT and PE.  On Plavix for previous CVA.  She is on continuous O2 at 2 L.  She is in a wheelchair today accompanied by her daughter.  She converted back to normal sinus rhythm on her last EKG.  She is denied any recent palpitations or arrhythmias.  She is scheduled to see Dr. Roxan Hockey in [place] for insertion of drain for her pleural effusion per her daughter statement.  She is not very active on a daily basis.  Denies any chest pain, exertional symptoms, stroke or TIA-like symptoms, bleeding issues, claudication-like symptoms, or lower extremity edema.  Her PCP is Dr. Eber Hong in [place].   Past Medical History:  Diagnosis Date  . Allergy   . Anemia   . Anemia of chronic  disease 09/21/2016  . Anxiety   . Arthritis    arthritis- left hip  . Blood transfusion without reported diagnosis    transfusion- 3-4 yrs ago -found to be anemic on routine lab check  . Breast cancer ([place]) 2002   bilateral- bilateral mastectomies done.  . 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 CVA (cerebral infarction) 04/30/2013  . Encounter for antineoplastic chemotherapy 10/28/2016  . Family history of brain cancer   . Family history of breast cancer   . Family history of colon cancer 10/06/2009  . Family history of colon cancer   . Family history of prostate cancer   . GERD (gastroesophageal reflux disease)   . Glaucoma   . History of bilateral mastectomy 05/20/2010  . Hypertension   . Lung cancer ([place]) dx'd 12/2010   last Ct scan "no lung cancer" showing 11'16 CT Chest Epic.  . Malignant neoplasm of ascending colon ([place]) 01/29/2016  . Stroke [facility])    2 yrs ago-no residual  . Tubular adenoma of colon 07/2011   colon polyps ans reoccurence with malignancy found    Past Surgical History:  Procedure Laterality Date  . APPENDECTOMY    . BOWEL DECOMPRESSION N/A 06/12/2018   Procedure: BOWEL DECOMPRESSION;  Surgeon: Rush Landmark Telford Nab., MD;  Location: [facility];  Service: Gastroenterology;  Laterality: N/A;  . cataracts Bilateral   . COLECTOMY WITH COLOSTOMY CREATION/HARTMANN PROCEDURE  06/16/2018   Procedure: CREATION/HARTMANN COLOSTOMY;  Surgeon: Michael Boston, MD;  Location: [facility] ORS;  Service: General;;  .  COLONOSCOPY    . COLOSTOMY N/A 06/16/2018   Procedure: POSSIBLE COLOSTOMY;  Surgeon: Michael Boston, MD;  Location: [facility];  Service: General;  Laterality: N/A;  . EYE SURGERY    . FLEXIBLE SIGMOIDOSCOPY N/A 06/12/2018   Procedure: FLEXIBLE SIGMOIDOSCOPY;  Surgeon: Rush Landmark Telford Nab., MD;  Location: [facility];  Service: Gastroenterology;  Laterality: N/A;  . LAPAROSCOPIC SIGMOID COLECTOMY N/A 06/16/2018   Procedure: LAPAROSCOPIC LOW ANTERIOR RESECTION;  Surgeon: Michael Boston, MD;  Location: [facility];  Service: General;  Laterality: N/A;  . LUNG SURGERY     Resection -" not done. Pt . tx with Tarceva  . MASTECTOMY Bilateral   . MEDIASTINOSCOPY N/A 10/02/2016   Procedure: MEDIASTINOSCOPY;  Surgeon: [name], MD;  Location: [facility] OR;  Service: Thoracic;  Laterality: N/A;  . RECONSTRUCTION BREAST W/ TRAM FLAP Bilateral    bilaterally  . robotic right hemicolectomy Right 03/06/2016   Dr. Johney Maine  . TEE WITHOUT CARDIOVERSION N/A 06/04/2013   Procedure: TRANSESOPHAGEAL ECHOCARDIOGRAM (TEE);  Surgeon: Jolaine Artist, MD;  Location: [facility] ENDOSCOPY;  Service: Cardiovascular;  Laterality: N/A;  . TONSILLECTOMY    . TOTAL HIP ARTHROPLASTY Left 08/07/2016   Procedure: LEFT TOTAL HIP ARTHROPLASTY ANTERIOR APPROACH;  Surgeon: Gaynelle Arabian, MD;  Location: [facility];  Service: Orthopedics;  Laterality: Left;    Current Outpatient Medications  Medication Sig Dispense Refill  . amiodarone (PACERONE) 200 MG tablet Take 1 tablet (200 mg total) by mouth daily. 90 tablet 0  . brimonidine-timolol (COMBIGAN) 0.2-0.5 % ophthalmic solution Place 1 drop into the right eye every 12 (twelve) hours.    . Cholecalciferol (VITAMIN D3) 50 MCG (2000 UT) capsule Take 1 capsule (2,000 Units total) by mouth daily. 90 capsule 0  . clopidogrel (PLAVIX) 75 MG tablet Take 75 mg by mouth daily.    . cycloSPORINE (RESTASIS) 0.05 % ophthalmic emulsion Place 1 drop into both eyes 2 (two) times daily.    Marland Kitchen escitalopram (LEXAPRO) 10 MG tablet Take 10 mg by mouth daily.     . famotidine (PEPCID) 40 MG tablet Take 40 mg by mouth daily.    . feeding supplement, ENSURE ENLIVE, (ENSURE ENLIVE) LIQD Take 237 mLs by mouth 2 (two) times daily between meals. 237 mL 12  . FeFum-FePoly-FA-B Cmp-C-Biot (FOLIVANE-PLUS) CAPS TAKE 1 CAPSULE BY MOUTH EVERY DAY IN THE MORNING (Patient taking differently: Take 1 capsule by mouth daily. ) 90 capsule 2  . furosemide (LASIX) 40 MG tablet Take 40 mg by mouth daily.     Marland Kitchen letrozole (FEMARA) 2.5 MG tablet TAKE 1 TABLET BY MOUTH EVERY DAY (Patient taking differently: Take 2.5 mg by mouth daily. ) 90 tablet 1  . Multiple Vitamins-Iron (MULTIVITAMINS WITH IRON) TABS tablet Take 1 tablet by mouth daily.  0  . Multiple Vitamins-Minerals (PRESERVISION AREDS 2) CAPS Take by mouth.    Marland Kitchen osimertinib mesylate (TAGRISSO) 80 MG tablet Take 1 tablet (80 mg total) by mouth daily. 30 tablet 3  . prochlorperazine (COMPAZINE) 10 MG tablet Take 1 tablet (10 mg total) by mouth every 6 (six) hours as needed for nausea or vomiting. 30 tablet 0  . rivaroxaban (XARELTO) 20 MG TABS tablet Take 20 mg by mouth daily with supper.     Current Facility-Administered Medications  Medication Dose Route Frequency Provider Last Rate Last Admin  . 0.9 %  sodium chloride infusion  500 mL Intravenous Continuous Armbruster, Carlota Raspberry, MD       Allergies:  Doxycycline and Sulfa antibiotics  Social History: The patient  reports that she has never smoked. She has never used smokeless tobacco. She reports current alcohol use. She reports that she does not use drugs.   Family History: The patient's family history includes Brain cancer in her cousin and paternal uncle; Breast cancer (age of onset: 19) in her sister; Cancer in her cousin, maternal uncle, paternal aunt, and paternal uncle; Clotting disorder (age of onset: 64) in her mother; Colon cancer in her cousin and sister; Colon cancer (age of onset: 71) in her maternal aunt; Heart disease in her mother; Lung cancer in her father and maternal uncle; Multiple myeloma in her paternal grandmother; Other in her maternal uncle; Prostate cancer in her maternal uncle; Stomach cancer in her paternal aunt.   ROS:  Please see the history of present illness. Otherwise, complete review of systems is positive for none.  All other systems are reviewed and negative.   Physical Exam: VS:  BP (!) 146/76   Pulse 72   Ht 5' 6"  (1.676 m)   Wt 119 lb 9.6 oz (54.3 kg)    SpO2 98%   BMI 19.30 kg/m , BMI Body mass index is 19.3 kg/m.  Wt Readings from Last 3 Encounters:  03/13/20 119 lb 9.6 oz (54.3 kg)  03/06/20 121 lb 14.4 oz (55.3 kg)  02/19/20 135 lb 6.4 oz (61.4 kg)    General: Patient appears comfortable at rest. Neck: Supple, no elevated JVP or carotid bruits, no thyromegaly. Lungs: Decreased throughout, nonlabored breathing at rest. Cardiac: Regular rate and rhythm, no S3 or significant systolic murmur, no pericardial rub. Extremities: No pitting edema, distal pulses 2+. Skin: Warm and dry. Musculoskeletal: No kyphosis. Neuropsychiatric: Alert and oriented x3, affect grossly appropriate.  ECG:  EKG on 02/09/2020 showed sinus rhythm rate of 88.  Recent Labwork: 02/10/2020: B Natriuretic Peptide 717.3 02/11/2020: TSH 0.015; TSH 0.016 02/17/2020: Magnesium 2.3 03/06/2020: ALT 38; AST 24; BUN 19; Creatinine 1.14; Hemoglobin 9.4; Platelet Count 467; Potassium 4.0; Sodium 140     Component Value Date/Time   CHOL 202 (H) 04/30/2013 0430   TRIG 191 (H) 06/22/2018 0405   HDL 93 04/30/2013 0430   CHOLHDL 2.2 04/30/2013 0430   VLDL 12 04/30/2013 0430   LDLCALC 97 04/30/2013 0430    Other Studies Reviewed Today:  Echocardiogram 02/10/2020 1. Left ventricular ejection fraction, by estimation, is 50 to 55%. The left ventricle has low normal function. The left ventricle has no regional wall motion abnormalities. Left ventricular diastolic parameters are consistent with Grade I diastolic dysfunction (impaired relaxation). 2. Right ventricular systolic function is moderately reduced. The right ventricular size is moderately enlarged. D-shaped septum suggestive of RV pressure/volume overload. No complete TR doppler jet so unable to estimate PA systolic pressure. 3. The aortic valve is tricuspid. Aortic valve regurgitation is trivial. No aortic stenosis is present. 4. The mitral valve is normal in structure. No evidence of mitral valve regurgitation.  No evidence of mitral stenosis. 5. The inferior vena cava is dilated in size with <50% respiratory variability, suggesting right atrial pressure of 15 mmHg. 6. Left-sided pleural effusion noted. Trivial pericardial effusion.  Lower extremity venous study 02/11/2020 Summary:  RIGHT:  - There is no evidence of deep vein thrombosis in the lower extremity.    - No cystic structure found in the popliteal fossa.    LEFT:  - Findings consistent with acute deep vein thrombosis involving the left  posterior tibial veins, and left peroneal veins.  - No cystic structure  found in the popliteal fossa.      CT angio chest 02/09/2020 IMPRESSION: 1. Right lower lobe segmental pulmonary emboli. Clot burden is minimal. 2. Significant interval enlargement of the partially loculated left pleural effusion, now measuring greater than 1 L in volume. Continued areas of pleural thickening suspicious for recurrent disease. 3. Trace right pleural effusion. 4. Hiatal hernia.   Assessment and Plan:  1. Essential hypertension   2. Acute deep vein thrombosis (DVT) of left peroneal vein (HCC)   3. Other acute pulmonary embolism, unspecified whether acute cor pulmonale present (HCC)   4. Paroxysmal atrial fibrillation ([place])    1. Essential hypertension Patient had recently been hypotensive and her lisinopril and 1 dose of Lasix was decreased due to low blood pressures by PCP.  Blood pressure today is 146/76.  2. Acute deep vein thrombosis (DVT) of left peroneal vein (HCC) Recent DVT and PE.  On Xarelto.  Likely from hypercoagulable state secondary to lung CA  3. Other acute pulmonary embolism, unspecified whether acute cor pulmonale present (HCC) Recent pulmonary embolism.  On Xarelto.  Likely hypercoagulable state secondary to lung CA.  4.  Paroxysmal atrial fibrillation. Recent onset of atrial fibrillation when performing a thoracentesis for malignant pleural effusion.  She was placed on  amiodarone.  Currently in a normal rhythm per auscultation.  She converted to normal sinus rhythm shortly after going into atrial fibrillation as evidenced on last EKG.  May need to discontinue amiodarone in the future given the patient's significant lung disease due to its possible contribution pulmonary fibrosis.  Medication Adjustments/Labs and Tests Ordered: Current medicines are reviewed at length with the patient today.  Concerns regarding medicines are outlined above.   Disposition: Follow-up with Dr Bronson Ing or APP 6 months  Signed, Levell July, NP 03/13/2020 1:38 PM    [facility] at [place], [place], [place] [address] Phone: [phone]; Fax: [phone]

## 2020-03-13 ENCOUNTER — Ambulatory Visit (INDEPENDENT_AMBULATORY_CARE_PROVIDER_SITE_OTHER): Payer: Medicare Other | Admitting: Family Medicine

## 2020-03-13 ENCOUNTER — Other Ambulatory Visit: Payer: Self-pay

## 2020-03-13 ENCOUNTER — Encounter: Payer: Self-pay | Admitting: Family Medicine

## 2020-03-13 VITALS — BP 146/76 | HR 72 | Ht 66.0 in | Wt 119.6 lb

## 2020-03-13 DIAGNOSIS — I10 Essential (primary) hypertension: Secondary | ICD-10-CM | POA: Diagnosis not present

## 2020-03-13 DIAGNOSIS — I2699 Other pulmonary embolism without acute cor pulmonale: Secondary | ICD-10-CM

## 2020-03-13 DIAGNOSIS — I480 Paroxysmal atrial fibrillation: Secondary | ICD-10-CM

## 2020-03-13 DIAGNOSIS — I82452 Acute embolism and thrombosis of left peroneal vein: Secondary | ICD-10-CM

## 2020-03-13 NOTE — Patient Instructions (Signed)
Medication Instructions:    Your physician recommends that you continue on your current medications as directed. Please refer to the Current Medication list given to you today.  Labwork:  NONE  Testing/Procedures:  NONE  Follow-Up:  Your physician recommends that you schedule a follow-up appointment in: 6 months (office)  Any Other Special Instructions Will Be Listed Below (If Applicable).  If you need a refill on your cardiac medications before your next appointment, please call your pharmacy. 

## 2020-03-15 ENCOUNTER — Encounter: Payer: Medicare Other | Admitting: Thoracic Surgery (Cardiothoracic Vascular Surgery)

## 2020-03-15 ENCOUNTER — Encounter: Payer: Self-pay | Admitting: Thoracic Surgery (Cardiothoracic Vascular Surgery)

## 2020-03-15 ENCOUNTER — Other Ambulatory Visit: Payer: Self-pay | Admitting: *Deleted

## 2020-03-15 ENCOUNTER — Institutional Professional Consult (permissible substitution) (INDEPENDENT_AMBULATORY_CARE_PROVIDER_SITE_OTHER): Payer: Medicare Other | Admitting: Thoracic Surgery (Cardiothoracic Vascular Surgery)

## 2020-03-15 ENCOUNTER — Other Ambulatory Visit: Payer: Self-pay

## 2020-03-15 VITALS — BP 145/74 | HR 86 | Temp 97.7°F | Resp 20 | Ht 66.0 in | Wt 119.0 lb

## 2020-03-15 DIAGNOSIS — I509 Heart failure, unspecified: Secondary | ICD-10-CM

## 2020-03-15 DIAGNOSIS — C3491 Malignant neoplasm of unspecified part of right bronchus or lung: Secondary | ICD-10-CM

## 2020-03-15 DIAGNOSIS — I4891 Unspecified atrial fibrillation: Secondary | ICD-10-CM

## 2020-03-15 MED ORDER — PREDNISONE 10 MG (21) PO TBPK: ORAL_TABLET | ORAL | 0 refills | Status: AC

## 2020-03-15 NOTE — Progress Notes (Signed)
PCP is Eber Hong, MD Referring Provider is Heilingoetter, Cassandr*  Chief Complaint  Patient presents with  . Malignant Pleural Effusion    Surgical eval, PET Scan 03/03/20, MR Brain 02/15/20, CTA Chest 02/09/20    HPI: Mrs. Kristin [name] is sent in consultation regarding a pleural effusion.  Kristin [name] is an 79 year old woman with a history of stage IV lung cancer, T2N0 adenocarcinoma the ascending colon, breast cancer, stroke, anxiety, anemia, arthritis, reflux, hypertension, glaucoma, and depression.  She presented with shortness of breath and fevers in late April.  She went to the emergency room.  She was found to have an elevated BNP, acute PE in the right lower lobe PA branches, and a large left pleural effusion.  She had a left thoracentesis which caused severe pain.  950 mL of fluid was removed.  Cytology was positive for malignant cells.  Cultures were negative.  She also had new onset atrial fibrillation.  She was started on amiodarone and Xarelto.  She had an E. coli urinary tract infection which was treated with ceftriaxone.  She was discharged on Lasix 40 mg twice daily to treat her acute on chronic diastolic congestive heart failure.  After discharge that was decreased to 40 mg daily due to a low diastolic pressure.  She had severe pain with thoracentesis.  This was bad enough that she refuses to have another thoracentesis done.  About a liter of fluid was removed with a thoracentesis.  Chest x-ray showed significant improvement.  Even on her recent PET from 2 weeks ago there was only a small residual left effusion.  Interestingly there was a new moderate right pleural effusion.  She saw oncology and was referred here for consideration for pleural catheter.  Zubrod Score: At the time of surgery this patient's most appropriate activity status/level should be described as: _0     0    Normal activity, no symptoms _1     1    Restricted in physical strenuous activity but ambulatory, able to  do out light work _2     2    Ambulatory and capable of self care, unable to do work activities, up and about >50 % of waking hours                              _3     3    Only limited self care, in bed greater than 50% of waking hours _4     4    Completely disabled, no self care, confined to bed or chair _5     5    Moribund  Past Medical History:  Diagnosis Date  . Allergy   . Anemia   . Anemia of chronic disease 09/21/2016  . Anxiety   . Arthritis    arthritis- left hip  . Blood transfusion without reported diagnosis    transfusion- 3-4 yrs ago -found to be anemic on routine lab check  . Breast cancer ([place]) 2002   bilateral- bilateral mastectomies done.  . 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 CVA (cerebral infarction) 04/30/2013  . Encounter for antineoplastic chemotherapy 10/28/2016  . Family history of brain cancer   . Family history of breast cancer   . Family history of colon cancer 10/06/2009  . Family history of colon cancer   . Family history of prostate cancer   . GERD (gastroesophageal reflux disease)   . Glaucoma   . History of bilateral mastectomy 05/20/2010  . Hypertension   .  Lung cancer ([place]) dx'd 12/2010   last Ct scan "no lung cancer" showing 11'16 CT Chest Epic.  . Malignant neoplasm of ascending colon ([place]) 01/29/2016  . Stroke [facility])    2 yrs ago-no residual  . Tubular adenoma of colon 07/2011   colon polyps ans reoccurence with malignancy found    Past Surgical History:  Procedure Laterality Date  . APPENDECTOMY    . BOWEL DECOMPRESSION N/A 06/12/2018   Procedure: BOWEL DECOMPRESSION;  Surgeon: Rush Landmark Telford Nab., MD;  Location: [facility];  Service: Gastroenterology;  Laterality: N/A;  . cataracts Bilateral   . COLECTOMY WITH COLOSTOMY CREATION/HARTMANN PROCEDURE  06/16/2018   Procedure: CREATION/HARTMANN COLOSTOMY;  Surgeon: Michael Boston, MD;  Location: [facility];  Service: General;;  . COLONOSCOPY    . COLOSTOMY N/A 06/16/2018   Procedure: POSSIBLE  COLOSTOMY;  Surgeon: Michael Boston, MD;  Location: [facility];  Service: General;  Laterality: N/A;  . EYE SURGERY    . FLEXIBLE SIGMOIDOSCOPY N/A 06/12/2018   Procedure: FLEXIBLE SIGMOIDOSCOPY;  Surgeon: Rush Landmark Telford Nab., MD;  Location: [facility];  Service: Gastroenterology;  Laterality: N/A;  . LAPAROSCOPIC SIGMOID COLECTOMY N/A 06/16/2018   Procedure: LAPAROSCOPIC LOW ANTERIOR RESECTION;  Surgeon: Michael Boston, MD;  Location: [facility];  Service: General;  Laterality: N/A;  . LUNG SURGERY     Resection -" not done. Pt . tx with Tarceva  . MASTECTOMY Bilateral   . MEDIASTINOSCOPY N/A 10/02/2016   Procedure: MEDIASTINOSCOPY;  Surgeon: [name], MD;  Location: [facility] OR;  Service: Thoracic;  Laterality: N/A;  . RECONSTRUCTION BREAST W/ TRAM FLAP Bilateral    bilaterally  . robotic right hemicolectomy Right 03/06/2016   Dr. Johney Maine  . TEE WITHOUT CARDIOVERSION N/A 06/04/2013   Procedure: TRANSESOPHAGEAL ECHOCARDIOGRAM (TEE);  Surgeon: Jolaine Artist, MD;  Location: [facility] ENDOSCOPY;  Service: Cardiovascular;  Laterality: N/A;  . TONSILLECTOMY    . TOTAL HIP ARTHROPLASTY Left 08/07/2016   Procedure: LEFT TOTAL HIP ARTHROPLASTY ANTERIOR APPROACH;  Surgeon: Gaynelle Arabian, MD;  Location: [facility];  Service: Orthopedics;  Laterality: Left;    Family History  Problem Relation Age of Onset  . Lung cancer Father   . Heart disease Mother   . Clotting disorder Mother 29       coronary thrombosis  . Colon cancer Sister        dx in her 71s  . Breast cancer Sister 66  . Multiple myeloma Paternal Grandmother   . Brain cancer Paternal Uncle   . Cancer Paternal Aunt        unknown  . Colon cancer Maternal Aunt 80  . Prostate cancer Maternal Uncle        dx in his late 45s  . Other Maternal Uncle        brain tumor dx in his 58s  . Lung cancer Maternal Uncle   . Cancer Maternal Uncle        NOS  . Stomach cancer Paternal Aunt   . Brain cancer Cousin        maternal first cousin dx in  late 48s-50s  . Cancer Paternal Uncle        NOS  . Colon cancer Cousin        paternal cousin dx in his 70s  . Cancer Cousin        paternal cousin dx with NOS cancer    Social History Social History   Tobacco Use  . Smoking status: Never Smoker  . Smokeless tobacco:  Never Used  Substance Use Topics  . Alcohol use: Yes    Alcohol/week: 0.0 standard drinks    Comment: ocassional beer  . Drug use: No    Current Outpatient Medications  Medication Sig Dispense Refill  . amiodarone (PACERONE) 200 MG tablet Take 1 tablet (200 mg total) by mouth daily. 90 tablet 0  . brimonidine-timolol (COMBIGAN) 0.2-0.5 % ophthalmic solution Place 1 drop into the right eye every 12 (twelve) hours.    . Cholecalciferol (VITAMIN D3) 50 MCG (2000 UT) capsule Take 1 capsule (2,000 Units total) by mouth daily. 90 capsule 0  . clopidogrel (PLAVIX) 75 MG tablet Take 75 mg by mouth daily.    . cycloSPORINE (RESTASIS) 0.05 % ophthalmic emulsion Place 1 drop into both eyes 2 (two) times daily.    Marland Kitchen escitalopram (LEXAPRO) 10 MG tablet Take 10 mg by mouth daily.     . famotidine (PEPCID) 40 MG tablet Take 40 mg by mouth daily.    . feeding supplement, ENSURE ENLIVE, (ENSURE ENLIVE) LIQD Take 237 mLs by mouth 2 (two) times daily between meals. 237 mL 12  . FeFum-FePoly-FA-B Cmp-C-Biot (FOLIVANE-PLUS) CAPS TAKE 1 CAPSULE BY MOUTH EVERY DAY IN THE MORNING (Patient taking differently: Take 1 capsule by mouth daily. ) 90 capsule 2  . furosemide (LASIX) 40 MG tablet Take 40 mg by mouth daily.    Marland Kitchen letrozole (FEMARA) 2.5 MG tablet TAKE 1 TABLET BY MOUTH EVERY DAY (Patient taking differently: Take 2.5 mg by mouth daily. ) 90 tablet 1  . Multiple Vitamins-Iron (MULTIVITAMINS WITH IRON) TABS tablet Take 1 tablet by mouth daily.  0  . Multiple Vitamins-Minerals (PRESERVISION AREDS 2) CAPS Take by mouth.    Marland Kitchen osimertinib mesylate (TAGRISSO) 80 MG tablet Take 1 tablet (80 mg total) by mouth daily. 30 tablet 3  .  prochlorperazine (COMPAZINE) 10 MG tablet Take 1 tablet (10 mg total) by mouth every 6 (six) hours as needed for nausea or vomiting. 30 tablet 0  . rivaroxaban (XARELTO) 20 MG TABS tablet Take 20 mg by mouth daily with supper.    . predniSONE (STERAPRED UNI-PAK 21 TAB) 10 MG (21) TBPK tablet Take 6 tablets (60 mg total) by mouth daily for 1 day, THEN 5 tablets (50 mg total) daily for 1 day, THEN 4 tablets (40 mg total) daily for 1 day, THEN 3 tablets (30 mg total) daily for 1 day, THEN 2 tablets (20 mg total) daily for 1 day, THEN 1 tablet (10 mg total) daily for 1 day. 21 tablet 0   Current Facility-Administered Medications  Medication Dose Route Frequency Provider Last Rate Last Admin  . 0.9 %  sodium chloride infusion  500 mL Intravenous Continuous Armbruster, Carlota Raspberry, MD        Allergies  Allergen Reactions  . Doxycycline Nausea Only    Weak, stomach  . Sulfa Antibiotics Rash    Review of Systems  Constitutional: Positive for activity change, fatigue and unexpected weight change (9 pound weight loss in 3 months).  HENT: Positive for hearing loss. Negative for trouble swallowing and voice change.   Respiratory: Positive for shortness of breath. Negative for chest tightness.        Home oxygen  Cardiovascular: Negative for chest pain and leg swelling.  Musculoskeletal:       Left chest wall pain  Hematological: Bruises/bleeds easily.    BP (!) 145/74   Pulse 86   Temp 97.7 F (36.5 C) (Skin)   Resp 20  Ht _0  (1.676 m)   Wt 119 lb (54 kg)   SpO2 99% Comment: RA  BMI 19.21 kg/m  Physical Exam   Diagnostic Tests: NUCLEAR MEDICINE PET SKULL BASE TO THIGH  TECHNIQUE: 7.0 mCi F-18 FDG was injected intravenously. Full-ring PET imaging was performed from the skull base to thigh after the radiotracer. CT data was obtained and used for attenuation correction and anatomic localization.  Fasting blood glucose: 113 mg/dl  COMPARISON:  PET-CT 09/20/2016.  Chest CT  02/09/2020  FINDINGS: Mediastinal blood pool activity: SUV max 2.85  Liver activity: SUV max NA  NECK: There is a new hypermetabolic left thyroid lobe lesion with SUV max of 7.0. It measures 16 mm. Recommend thyroid ultrasound and biopsy. No neck adenopathy.  Incidental CT findings: none  CHEST: The left pleural effusion is smaller status post prior thoracentesis. Ill-defined pleural and subpleural soft tissue density and probable patchy overlying atelectasis. Areas of hypermetabolism are noted along the pleural surface with SUV max ranging from 4.0 to 4.88. Findings certainly worrisome for pleural metastatic disease, particularly given the thoracentesis results.  Large area of ground-glass opacity in the right upper lobe peripherally is likely inflammatory or possibly infectious. This is new since the prior chest CT from April. Low level hypermetabolism with SUV max of 2.55. There is an associated moderate-sized right pleural effusion which was not present on the recent chest CT.  No enlarged or hypermetabolic mediastinal or hilar lymph nodes.  Incidental CT findings: Stable moderate to large hiatal hernia.  ABDOMEN/PELVIS: No abnormal hypermetabolic activity within the liver, pancreas, adrenal glands, or spleen. No hypermetabolic lymph nodes in the abdomen or pelvis.  Incidental CT findings: Simple right hepatic lobe cyst is noted. Surgical changes involving the right colon and left lower quadrant colostomy. Stable aortic and iliac artery calcifications. Stable numerous small calcified uterine fibroids.  SKELETON: No findings suspicious for osseous metastatic disease.  Incidental CT findings: none  IMPRESSION: 1. Low level areas of hypermetabolism involving the left pleura consistent with pleural metastatic disease. 2. No worrisome pulmonary nodules or lesions and no mediastinal or hilar lymphadenopathy. 3. Right upper lobe inflammatory or infectious  process and moderate-sized right pleural effusion. 4. No findings suspicious for abdominal/pelvic metastatic disease or osseous metastatic disease. 5. 16 mm hypermetabolic left thyroid lesion. Recommend thyroid US and biopsy. (Ref: J Am Coll Radiol. 2015 Feb;12(2): 143-50).   Electronically Signed   By: Marijo Sanes M.D.   On: 03/05/2020 09:16 I personally reviewed the PET/CT and concur with the findings noted above  Impression: Kristin [name] is an 79 year old woman with stage IV lung cancer, colon cancer, and breast cancer.  She was diagnosed with lung cancer in 2012.  She had a positive EGFR mutation and was treated with Tarceva.  That was stopped when she had disease progression.  She has been on [place] for a little over 2 years.  She presented with shortness of breath and fevers back in April.  Work-up showed a moderate left pleural effusion.  A CT of the chest showed pulmonary emboli in addition to the pleural effusion.  Of note there was no right pleural effusion at that time, but there was right upper lobe inflammatory or infectious process noted.  She had a thoracentesis which was positive for malignant cells.  She was seen in consultation by pulmonology.  They recommended against a pleural catheter due to her pain from the thoracentesis.  I agree with that assessment.  In the interim she had a  PET scan about 2 weeks ago and also about 2 weeks when she was discharged from the hospital.  There was a small residual left pleural effusion but essentially unchanged from her postthoracentesis film.  Interestingly there was a new right pleural effusion.  That fluid has not been sampled.  There is not enough fluid in the left chest to warrant placement of the pleural catheter currently.  If the effusion were to recur and enlarge to the point where she is willing to have an intervention then we could consider that.  Regarding the right pleural effusion we really have no data to go on.   This could be transudate of due to heart failure, exudative due to infection, or malignant.  The first step would typically be to tap the effusion, but she refuses to consider that option.  I recommended that we check a BNP to assess whether she is still in heart failure.  Given that she had this infectious/inflammatory process in the right upper lobe and now has a pleural effusion, I think it is reasonable to treat her empirically with a prednisone taper.  I will then plan to see her back in a couple weeks with a new chest x-ray to assess how to proceed from there.  Plan: Check BNP Prednisone taper Return in 2 weeks with PA and lateral chest x-ray  I spent 30 minutes in review of records, review of images, and in consultation with Kristin [name] today. [name], MD [facility] [phone]

## 2020-03-16 LAB — BRAIN NATRIURETIC PEPTIDE: Brain Natriuretic Peptide: 81 pg/mL (ref <100)

## 2020-03-20 ENCOUNTER — Encounter (HOSPITAL_COMMUNITY): Payer: Self-pay | Admitting: Internal Medicine

## 2020-03-30 ENCOUNTER — Ambulatory Visit
Admission: RE | Admit: 2020-03-30 | Discharge: 2020-03-30 | Disposition: A | Discharge status: Home / Self Care | Payer: Medicare Other | Source: Ambulatory Visit | Attending: Physician Assistant | Admitting: Physician Assistant

## 2020-03-30 ENCOUNTER — Other Ambulatory Visit (HOSPITAL_COMMUNITY)
Admission: RE | Admit: 2020-03-30 | Discharge: 2020-03-30 | Disposition: A | Discharge status: Home / Self Care | Payer: Medicare Other | Source: Ambulatory Visit | Attending: Physician Assistant | Admitting: Physician Assistant

## 2020-03-30 DIAGNOSIS — C3491 Malignant neoplasm of unspecified part of right bronchus or lung: Secondary | ICD-10-CM

## 2020-03-30 DIAGNOSIS — E041 Nontoxic single thyroid nodule: Secondary | ICD-10-CM

## 2020-03-31 LAB — CYTOLOGY - NON PAP

## 2020-04-03 ENCOUNTER — Other Ambulatory Visit: Payer: Self-pay | Admitting: Thoracic Surgery (Cardiothoracic Vascular Surgery)

## 2020-04-03 DIAGNOSIS — C3412 Malignant neoplasm of upper lobe, left bronchus or lung: Secondary | ICD-10-CM

## 2020-04-04 ENCOUNTER — Encounter: Payer: Self-pay | Admitting: Thoracic Surgery (Cardiothoracic Vascular Surgery)

## 2020-04-04 ENCOUNTER — Ambulatory Visit
Admission: RE | Admit: 2020-04-04 | Discharge: 2020-04-04 | Disposition: A | Discharge status: Home / Self Care | Payer: Medicare Other | Source: Ambulatory Visit | Attending: Thoracic Surgery (Cardiothoracic Vascular Surgery) | Admitting: Thoracic Surgery (Cardiothoracic Vascular Surgery)

## 2020-04-04 ENCOUNTER — Other Ambulatory Visit: Payer: Self-pay

## 2020-04-04 ENCOUNTER — Ambulatory Visit (INDEPENDENT_AMBULATORY_CARE_PROVIDER_SITE_OTHER): Payer: Medicare Other | Admitting: Thoracic Surgery (Cardiothoracic Vascular Surgery)

## 2020-04-04 VITALS — BP 128/77 | HR 71 | Temp 97.7°F | Resp 18 | Ht 66.0 in | Wt 121.4 lb

## 2020-04-04 DIAGNOSIS — C3412 Malignant neoplasm of upper lobe, left bronchus or lung: Secondary | ICD-10-CM

## 2020-04-04 DIAGNOSIS — J90 Pleural effusion, not elsewhere classified: Secondary | ICD-10-CM | POA: Diagnosis not present

## 2020-04-04 NOTE — Progress Notes (Signed)
[address]       [place] [address]             [phone]       HPI: Kristin [name] returns regarding her pleural effusions.  Kristin [name] is an 79 year old woman with stage IV lung cancer, T2N0 adenocarcinoma the ascending colon, breast cancer, stroke, anxiety, anemia, arthritis, reflux, hypertension, glaucoma, and depression.  She presented with shortness of breath and fevers in late April.  She had an acute PE and an elevated BNP.  She also had a large left pleural effusion.  Thoracentesis drained 950 mL of fluid.  Cytology was positive for cancer.  Cultures were negative.  She was started on amiodarone and Xarelto.  She had severe pain after thoracentesis and refused to have any further thoracenteses done.  She had a PET/CT in May which showed minimal left pleural effusion but there was a moderate to large right pleural effusion.  I gave her a steroid taper and she now returns for follow-up.  In the interim since her last visit, she says that she has been feeling reasonably well.  She is on oxygen via nasal cannula.  She is not having any worsening shortness of breath.  Past Medical History:  Diagnosis Date   Allergy    Anemia    Anemia of chronic disease 09/21/2016   Anxiety    Arthritis    arthritis- left hip   Blood transfusion without reported diagnosis    transfusion- 3-4 yrs ago -found to be anemic on routine lab check   Breast cancer [facility]) 2002   bilateral- bilateral mastectomies done.   Clinical depression 12/02/2000   CVA (cerebral infarction) 04/30/2013   Encounter for antineoplastic chemotherapy 10/28/2016   Family history of brain cancer    Family history of breast cancer    Family history of colon cancer 10/06/2009   Family history of colon cancer    Family history of prostate cancer    GERD (gastroesophageal reflux disease)    Glaucoma    History of bilateral mastectomy 05/20/2010   Hypertension    Lung cancer ([place])  dx'd 12/2010   last Ct scan "no lung cancer" showing 11'16 CT Chest Epic.   Malignant neoplasm of ascending colon ([place]) 01/29/2016   Stroke ([place])    2 yrs ago-no residual   Tubular adenoma of colon 07/2011   colon polyps ans reoccurence with malignancy found    Current Outpatient Medications  Medication Sig Dispense Refill   amiodarone (PACERONE) 200 MG tablet Take 1 tablet (200 mg total) by mouth daily. 90 tablet 0   brimonidine-timolol (COMBIGAN) 0.2-0.5 % ophthalmic solution Place 1 drop into the right eye every 12 (twelve) hours.     Cholecalciferol (VITAMIN D3) 50 MCG (2000 UT) capsule Take 1 capsule (2,000 Units total) by mouth daily. 90 capsule 0   clopidogrel (PLAVIX) 75 MG tablet Take 75 mg by mouth daily.     cycloSPORINE (RESTASIS) 0.05 % ophthalmic emulsion Place 1 drop into both eyes 2 (two) times daily.     escitalopram (LEXAPRO) 10 MG tablet Take 10 mg by mouth daily.      famotidine (PEPCID) 40 MG tablet Take 40 mg by mouth daily.     feeding supplement, ENSURE ENLIVE, (ENSURE ENLIVE) LIQD Take 237 mLs by mouth 2 (two) times daily between meals. 237 mL 12   FeFum-FePoly-FA-B Cmp-C-Biot (FOLIVANE-PLUS) CAPS TAKE 1 CAPSULE BY MOUTH EVERY DAY IN THE MORNING (Patient taking  differently: Take 1 capsule by mouth daily. ) 90 capsule 2   furosemide (LASIX) 40 MG tablet Take 40 mg by mouth daily.     letrozole (FEMARA) 2.5 MG tablet TAKE 1 TABLET BY MOUTH EVERY DAY (Patient taking differently: Take 2.5 mg by mouth daily. ) 90 tablet 1   Multiple Vitamins-Iron (MULTIVITAMINS WITH IRON) TABS tablet Take 1 tablet by mouth daily.  0   Multiple Vitamins-Minerals (PRESERVISION AREDS 2) CAPS Take by mouth.     osimertinib mesylate (TAGRISSO) 80 MG tablet Take 1 tablet (80 mg total) by mouth daily. 30 tablet 3   prochlorperazine (COMPAZINE) 10 MG tablet Take 1 tablet (10 mg total) by mouth every 6 (six) hours as needed for nausea or vomiting. 30 tablet 0   rivaroxaban  (XARELTO) 20 MG TABS tablet Take 20 mg by mouth daily with supper.     Current Facility-Administered Medications  Medication Dose Route Frequency Provider Last Rate Last Admin   0.9 %  sodium chloride infusion  500 mL Intravenous Continuous Armbruster, Carlota Raspberry, MD        Physical Exam BP 128/77 (BP Location: Right Arm, Patient Position: Sitting, Cuff Size: Small)    Pulse 71    Temp 97.7 F (36.5 C)    Resp 18    Ht 5\' 6"  (1.676 m)    Wt 121 lb 6.4 oz (55.1 kg)    SpO2 97% Comment: RA   BMI 19.59 kg/m  Frail elderly woman in no acute distress Alert and oriented x3 Lungs diminished at left base otherwise clear Cardiac regular rate and rhythm  Diagnostic Tests: CHEST - 2 VIEW  COMPARISON:  PET-CT from 03/03/2020  FINDINGS: Cardiac shadow is mildly enlarged but stable. Aortic calcifications are seen. Left basilar infiltrate with associated effusion is noted relatively stable from the prior exam given some variation in the technical factors of the film. No bony abnormality is noted.  IMPRESSION: Patchy basilar infiltrate in the left base with associated effusion relatively stable from previous exams.   Electronically Signed   By: Inez Catalina M.D.   On: 04/04/2020 12:00 I personally reviewed the chest x-ray images and concur with the findings noted above  Impression: Kristin [name] is an 79 year old woman with metastatic lung cancer.  She ended in April with shortness of breath.  That time she had an acute PE and was in atrial fibrillation and heart failure.  Thoracentesis drained about a liter of fluid.  Cytology was positive for metastatic carcinoma.  Later she had a PET/CT which showed a rather large right pleural effusion that had not been present previously.  Currently she is doing well symptomatically.  She is on oxygen but is not having worsening shortness of breath.  Her chest x-ray today shows complete resolution of her right pleural effusion.  She does have some  volume loss and effusion at the left base.  That appears stable from her PET/CT where there was really minimal pleural fluid.  I do not think any intervention is warranted on that as I do not think it would make any significant difference in her symptoms.  Plan: Follow-up with Dr. Julien Nordmann I will be happy to see Kristin [name] back anytime in the future if I can be of any assistance with her care  [name], MD [facility] [phone]

## 2020-04-06 ENCOUNTER — Other Ambulatory Visit: Payer: Self-pay

## 2020-04-06 ENCOUNTER — Encounter: Payer: Self-pay | Admitting: *Deleted

## 2020-04-06 ENCOUNTER — Encounter: Payer: Self-pay | Admitting: Internal Medicine

## 2020-04-06 ENCOUNTER — Inpatient Hospital Stay: Payer: Medicare Other | Attending: Internal Medicine | Admitting: Internal Medicine

## 2020-04-06 ENCOUNTER — Inpatient Hospital Stay: Payer: Medicare Other

## 2020-04-06 VITALS — BP 158/74 | HR 68 | Temp 97.7°F | Resp 20 | Ht 66.0 in | Wt 123.4 lb

## 2020-04-06 DIAGNOSIS — Z8673 Personal history of transient ischemic attack (TIA), and cerebral infarction without residual deficits: Secondary | ICD-10-CM | POA: Insufficient documentation

## 2020-04-06 DIAGNOSIS — Z7901 Long term (current) use of anticoagulants: Secondary | ICD-10-CM | POA: Diagnosis not present

## 2020-04-06 DIAGNOSIS — Z803 Family history of malignant neoplasm of breast: Secondary | ICD-10-CM | POA: Diagnosis not present

## 2020-04-06 DIAGNOSIS — D638 Anemia in other chronic diseases classified elsewhere: Secondary | ICD-10-CM | POA: Insufficient documentation

## 2020-04-06 DIAGNOSIS — Z8042 Family history of malignant neoplasm of prostate: Secondary | ICD-10-CM | POA: Diagnosis not present

## 2020-04-06 DIAGNOSIS — C3491 Malignant neoplasm of unspecified part of right bronchus or lung: Secondary | ICD-10-CM

## 2020-04-06 DIAGNOSIS — K56609 Unspecified intestinal obstruction, unspecified as to partial versus complete obstruction: Secondary | ICD-10-CM | POA: Insufficient documentation

## 2020-04-06 DIAGNOSIS — Z9013 Acquired absence of bilateral breasts and nipples: Secondary | ICD-10-CM | POA: Diagnosis not present

## 2020-04-06 DIAGNOSIS — Z170 Estrogen receptor positive status [ER+]: Secondary | ICD-10-CM | POA: Diagnosis not present

## 2020-04-06 DIAGNOSIS — J90 Pleural effusion, not elsewhere classified: Secondary | ICD-10-CM | POA: Diagnosis not present

## 2020-04-06 DIAGNOSIS — C50919 Malignant neoplasm of unspecified site of unspecified female breast: Secondary | ICD-10-CM | POA: Insufficient documentation

## 2020-04-06 DIAGNOSIS — Z800 Family history of malignant neoplasm of digestive organs: Secondary | ICD-10-CM | POA: Insufficient documentation

## 2020-04-06 DIAGNOSIS — F419 Anxiety disorder, unspecified: Secondary | ICD-10-CM | POA: Diagnosis not present

## 2020-04-06 DIAGNOSIS — Z933 Colostomy status: Secondary | ICD-10-CM | POA: Diagnosis not present

## 2020-04-06 DIAGNOSIS — C3412 Malignant neoplasm of upper lobe, left bronchus or lung: Secondary | ICD-10-CM | POA: Diagnosis not present

## 2020-04-06 DIAGNOSIS — Z79811 Long term (current) use of aromatase inhibitors: Secondary | ICD-10-CM | POA: Diagnosis not present

## 2020-04-06 DIAGNOSIS — Z85038 Personal history of other malignant neoplasm of large intestine: Secondary | ICD-10-CM | POA: Diagnosis not present

## 2020-04-06 DIAGNOSIS — Z85118 Personal history of other malignant neoplasm of bronchus and lung: Secondary | ICD-10-CM | POA: Insufficient documentation

## 2020-04-06 DIAGNOSIS — I10 Essential (primary) hypertension: Secondary | ICD-10-CM | POA: Diagnosis not present

## 2020-04-06 DIAGNOSIS — Z79899 Other long term (current) drug therapy: Secondary | ICD-10-CM | POA: Diagnosis not present

## 2020-04-06 DIAGNOSIS — C182 Malignant neoplasm of ascending colon: Secondary | ICD-10-CM | POA: Diagnosis not present

## 2020-04-06 LAB — CBC WITH DIFFERENTIAL (CANCER CENTER ONLY)
Abs Immature Granulocytes: 0.02 10*3/uL (ref 0.00–0.07)
Basophils Absolute: 0 10*3/uL (ref 0.0–0.1)
Basophils Relative: 1 %
Eosinophils Absolute: 0.1 10*3/uL (ref 0.0–0.5)
Eosinophils Relative: 2 %
HCT: 30.7 % — ABNORMAL LOW (ref 36.0–46.0)
Hemoglobin: 9.4 g/dL — ABNORMAL LOW (ref 12.0–15.0)
Immature Granulocytes: 0 %
Lymphocytes Relative: 17 %
Lymphs Abs: 1 10*3/uL (ref 0.7–4.0)
MCH: 29.1 pg (ref 26.0–34.0)
MCHC: 30.6 g/dL (ref 30.0–36.0)
MCV: 95 fL (ref 80.0–100.0)
Monocytes Absolute: 1 10*3/uL (ref 0.1–1.0)
Monocytes Relative: 17 %
Neutro Abs: 3.7 10*3/uL (ref 1.7–7.7)
Neutrophils Relative %: 63 %
Platelet Count: 295 10*3/uL (ref 150–400)
RBC: 3.23 MIL/uL — ABNORMAL LOW (ref 3.87–5.11)
RDW: 18.8 % — ABNORMAL HIGH (ref 11.5–15.5)
WBC Count: 5.8 10*3/uL (ref 4.0–10.5)
nRBC: 0 % (ref 0.0–0.2)

## 2020-04-06 LAB — CMP (CANCER CENTER ONLY)
ALT: 17 U/L (ref 0–44)
AST: 17 U/L (ref 15–41)
Albumin: 3 g/dL — ABNORMAL LOW (ref 3.5–5.0)
Alkaline Phosphatase: 114 U/L (ref 38–126)
Anion gap: 6 (ref 5–15)
BUN: 18 mg/dL (ref 8–23)
CO2: 33 mmol/L — ABNORMAL HIGH (ref 22–32)
Calcium: 9.7 mg/dL (ref 8.9–10.3)
Chloride: 102 mmol/L (ref 98–111)
Creatinine: 1.06 mg/dL — ABNORMAL HIGH (ref 0.44–1.00)
GFR, Est AFR Am: 57 mL/min — ABNORMAL LOW (ref >60)
GFR, Estimated: 50 mL/min — ABNORMAL LOW (ref >60)
Glucose, Bld: 73 mg/dL (ref 70–99)
Potassium: 3.6 mmol/L (ref 3.5–5.1)
Sodium: 141 mmol/L (ref 135–145)
Total Bilirubin: 0.3 mg/dL (ref 0.3–1.2)
Total Protein: 6.3 g/dL — ABNORMAL LOW (ref 6.5–8.1)

## 2020-04-06 NOTE — Progress Notes (Signed)
[place] [phone]   Fax:([phone]) [phone]  OFFICE PROGRESS NOTE  Eber Hong, MD [address] [place] [address]  DIAGNOSIS:  1) Metastatic non-small cell lung cancer, adenocarcinoma diagnosed in March 2012 with positive EGFR mutation in exon 21 (L858R). 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 2) adenocarcinoma of the ascending colon (T2, N0, M0) with MSI high diagnosed in March 2017. 3) history of breast adenocarcinoma.  PRIOR THERAPY:  1) Tarceva 150 mg by mouth daily status post 68 months of treatment. 2) Status post right colectomy with lymph node dissection in May 2017.  CURRENT THERAPY: 1) Tagrisso 80 mg by mouth daily started 11/09/2016.  Status post 42 months of treatment. 2) observation for the history of colon adenocarcinoma. 3) Femara 2.5 mg by mouth daily for history of breast cancer.  INTERVAL HISTORY: Kristin [name] 79 y.o. female returns to the clinic today for follow-up visit accompanied by her daughter.  The patient is feeling fine today with no concerning complaints except for the fatigue and intermittent pain on the left side of the chest.  She was seen recently by Dr. Roxan Hockey but repeat chest x-ray showed no significant reaccumulation of the left pleural effusion.  They decided to monitor her for now.  She denied having any shortness of breath, cough or hemoptysis.  She denied having any fever or chills.  She has no nausea, vomiting, diarrhea or constipation.  She has no headache or visual changes.  The patient is here today for evaluation and repeat blood work.    MEDICAL HISTORY: Past Medical History:  Diagnosis Date   Allergy    Anemia    Anemia of chronic disease 09/21/2016   Anxiety    Arthritis    arthritis- left hip   Blood transfusion without reported diagnosis    transfusion- 3-4 yrs ago -found to be anemic on routine lab check   Breast cancer ([place]) 2002    bilateral- bilateral mastectomies done.   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   Malignant neoplasm of ascending colon ([place]) 01/29/2016   Stroke ([place])    2 yrs ago-no residual   Tubular adenoma of colon 07/2011   colon polyps ans reoccurence with malignancy found    ALLERGIES:  is allergic to doxycycline and sulfa antibiotics.  MEDICATIONS:  Current Outpatient Medications  Medication Sig Dispense Refill   amiodarone (PACERONE) 200 MG tablet Take 1 tablet (200 mg total) by mouth daily. 90 tablet 0   brimonidine-timolol (COMBIGAN) 0.2-0.5 % ophthalmic solution Place 1 drop into the right eye every 12 (twelve) hours.     Cholecalciferol (VITAMIN D3) 50 MCG (2000 UT) capsule Take 1 capsule (2,000 Units total) by mouth daily. 90 capsule 0   clopidogrel (PLAVIX) 75 MG tablet Take 75 mg by mouth daily.     cycloSPORINE (RESTASIS) 0.05 % ophthalmic emulsion Place 1 drop into both eyes 2 (two) times daily.  escitalopram (LEXAPRO) 10 MG tablet Take 10 mg by mouth daily.      famotidine (PEPCID) 40 MG tablet Take 40 mg by mouth daily.     feeding supplement, ENSURE ENLIVE, (ENSURE ENLIVE) LIQD Take 237 mLs by mouth 2 (two) times daily between meals. 237 mL 12   FeFum-FePoly-FA-B Cmp-C-Biot (FOLIVANE-PLUS) CAPS TAKE 1 CAPSULE BY MOUTH EVERY DAY IN THE MORNING (Patient taking differently: Take 1 capsule by mouth daily. ) 90 capsule 2   furosemide (LASIX) 20 MG tablet Take 20 mg by mouth.      letrozole (FEMARA) 2.5 MG tablet TAKE 1 TABLET BY MOUTH EVERY DAY (Patient taking differently: Take 2.5 mg by mouth daily. ) 90 tablet 1   Multiple Vitamins-Iron (MULTIVITAMINS WITH IRON) TABS tablet Take 1 tablet by mouth daily.  0   Multiple Vitamins-Minerals (PRESERVISION AREDS 2) CAPS Take by mouth.     osimertinib mesylate (TAGRISSO) 80 MG tablet Take 1 tablet (80 mg total) by mouth daily. 30 tablet 3   prochlorperazine (COMPAZINE) 10 MG tablet Take 1 tablet (10 mg total) by mouth every 6 (six) hours as needed for nausea or vomiting. 30 tablet 0   rivaroxaban (XARELTO) 20 MG TABS tablet Take 20 mg by mouth daily with supper.     mirtazapine (REMERON) 7.5 MG tablet Take 7.5 mg by mouth at bedtime. (Patient not taking: Reported on 04/06/2020)     Current Facility-Administered Medications  Medication Dose Route Frequency Provider Last Rate Last Admin   0.9 %  sodium chloride infusion  500 mL Intravenous Continuous Armbruster, Carlota Raspberry, MD        SURGICAL HISTORY:  Past Surgical History:  Procedure Laterality Date   APPENDECTOMY     BOWEL DECOMPRESSION N/A 06/12/2018   Procedure: BOWEL DECOMPRESSION;  Surgeon: Irving Copas., MD;  Location: [facility];  Service: Gastroenterology;  Laterality: N/A;   cataracts Bilateral    COLECTOMY WITH COLOSTOMY CREATION/HARTMANN PROCEDURE  06/16/2018   Procedure: CREATION/HARTMANN COLOSTOMY;  Surgeon: Michael Boston, MD;  Location: [facility];  Service: General;;   COLONOSCOPY     COLOSTOMY N/A 06/16/2018   Procedure: POSSIBLE COLOSTOMY;  Surgeon: Michael Boston, MD;  Location: [facility];  Service: General;  Laterality: N/A;   EYE SURGERY     FLEXIBLE SIGMOIDOSCOPY N/A 06/12/2018   Procedure: FLEXIBLE SIGMOIDOSCOPY;  Surgeon: Irving Copas., MD;  Location: [facility];  Service: Gastroenterology;  Laterality: N/A;   LAPAROSCOPIC SIGMOID COLECTOMY N/A 06/16/2018   Procedure: LAPAROSCOPIC LOW ANTERIOR RESECTION;  Surgeon: Michael Boston, MD;  Location: [facility];  Service: General;  Laterality: N/A;   LUNG SURGERY     Resection -" not done. Pt . tx with Tarceva   MASTECTOMY Bilateral    MEDIASTINOSCOPY N/A 10/02/2016   Procedure: MEDIASTINOSCOPY;  Surgeon: [name], MD;  Location: [facility] OR;  Service: Thoracic;  Laterality: N/A;   RECONSTRUCTION BREAST W/ TRAM FLAP Bilateral    bilaterally   robotic right hemicolectomy Right 03/06/2016   Dr. Johney Maine   TEE WITHOUT CARDIOVERSION N/A 06/04/2013   Procedure: TRANSESOPHAGEAL ECHOCARDIOGRAM (TEE);  Surgeon: Jolaine Artist, MD;  Location: [facility];  Service: Cardiovascular;  Laterality: N/A;   TONSILLECTOMY     TOTAL HIP ARTHROPLASTY Left 08/07/2016   Procedure: LEFT TOTAL HIP ARTHROPLASTY ANTERIOR APPROACH;  Surgeon: Gaynelle Arabian, MD;  Location: [facility];  Service: Orthopedics;  Laterality: Left;    REVIEW OF SYSTEMS:  Constitutional: positive for anorexia and fatigue Eyes: negative Ears, nose, mouth, throat,  and face: negative Respiratory: positive for pleurisy/chest pain Cardiovascular: negative Gastrointestinal: negative Genitourinary:negative Integument/breast: negative Hematologic/lymphatic: negative Musculoskeletal:positive for arthralgias Neurological: negative Behavioral/Psych: negative Endocrine: negative Allergic/Immunologic: negative   PHYSICAL EXAMINATION: General appearance: alert, cooperative, fatigued and no distress Head: Normocephalic, without obvious abnormality, atraumatic Neck: no adenopathy, no JVD, supple, symmetrical, trachea midline and thyroid not enlarged, symmetric, no tenderness/mass/nodules Lymph nodes: Cervical, supraclavicular, and axillary nodes normal. Resp: clear to auscultation bilaterally Back: symmetric, no curvature. ROM normal. No CVA tenderness. Cardio: regular rate and rhythm, S1, S2 normal, no murmur, click, rub or gallop GI: soft, non-tender; bowel sounds normal; no masses,  no  organomegaly Extremities: extremities normal, atraumatic, no cyanosis or edema Neurologic: Alert and oriented X 3, normal strength and tone. Normal symmetric reflexes. Normal coordination and gait  ECOG PERFORMANCE STATUS: 1 - Symptomatic but completely ambulatory  Blood pressure (!) 158/74, pulse 68, temperature 97.7 F (36.5 C), temperature source Temporal, resp. rate 20, height _0  (1.676 m), weight 123 lb 6.4 oz (56 kg), SpO2 100 %.  LABORATORY DATA: Lab Results  Component Value Date   WBC 5.8 04/06/2020   HGB 9.4 (L) 04/06/2020   HCT 30.7 (L) 04/06/2020   MCV 95.0 04/06/2020   PLT 295 04/06/2020      Chemistry      Component Value Date/Time   NA 140 03/06/2020 1432   NA 138 09/26/2017 0942   K 4.0 03/06/2020 1432   K 5.2 No visable hemolysis (H) 09/26/2017 0942   CL 99 03/06/2020 1432   CL 107 04/05/2013 0754   CO2 32 03/06/2020 1432   CO2 26 09/26/2017 0942   BUN 19 03/06/2020 1432   BUN 16.0 09/26/2017 0942   CREATININE 1.14 (H) 03/06/2020 1432   CREATININE 1.2 (H) 09/26/2017 0942      Component Value Date/Time   CALCIUM 9.5 03/06/2020 1432   CALCIUM 9.9 09/26/2017 0942   ALKPHOS 149 (H) 03/06/2020 1432   ALKPHOS 68 09/26/2017 0942   AST 24 03/06/2020 1432   AST 18 09/26/2017 0942   ALT 38 03/06/2020 1432   ALT 13 09/26/2017 0942   BILITOT 0.2 (L) 03/06/2020 1432   BILITOT 0.31 09/26/2017 0942       RADIOGRAPHIC STUDIES: DG Chest 2 View  Result Date: 04/04/2020 CLINICAL DATA:  Shortness of breath EXAM: CHEST - 2 VIEW COMPARISON:  PET-CT from 03/03/2020 FINDINGS: Cardiac shadow is mildly enlarged but stable. Aortic calcifications are seen. Left basilar infiltrate with associated effusion is noted relatively stable from the prior exam given some variation in the technical factors of the film. No bony abnormality is noted. IMPRESSION: Patchy basilar infiltrate in the left base with associated effusion relatively stable from previous exams. Electronically  Signed   By: Inez Catalina M.D.   On: 04/04/2020 12:00   [place] FNA BX THYROID 1ST LESION AFIRMA  Result Date: 03/30/2020 INDICATION: Indeterminate thyroid nodule Patient presents today for ultrasound-guided fine-needle aspiration nodule #4 within the left lobe of the thyroid, correlating with the hypermetabolic nodule seen prior PET-CT. Note, nodule #2 also meets imaging criteria to recommend percutaneous sampling however this was discussed with referring endocrinologist who does NOT wish for this biopsy to undergo ultrasound-guided fine-needle aspiration this time. EXAM: ULTRASOUND GUIDED FINE NEEDLE ASPIRATION OF INDETERMINATE THYROID NODULE COMPARISON:  US Thyroid 03/10/20 MEDICATIONS: 5 cc 1% lidocaine COMPLICATIONS: None immediate. TECHNIQUE: Informed written consent was obtained from the patient after a discussion of the risks, benefits and alternatives to treatment. Questions regarding the procedure  were encouraged and answered. A timeout was performed prior to the initiation of the procedure. Pre-procedural ultrasound scanning demonstrated unchanged size and appearance of the indeterminate nodule within the left thyroid The procedure was planned. The neck was prepped in the usual sterile fashion, and a sterile drape was applied covering the operative field. A timeout was performed prior to the initiation of the procedure. Local anesthesia was provided with 1% lidocaine. Under direct ultrasound guidance, 5 FNA biopsies were performed of the left mid lobe with a 25 gauge needle. 2 of these samples were obtained for [facility] Multiple ultrasound images were saved for procedural documentation purposes. The samples were prepared and submitted to pathology. Limited post procedural scanning was negative for hematoma or additional complication. Dressings were placed. The patient tolerated the above procedures procedure well without immediate postprocedural complication. FINDINGS: Nodule reference number based on prior  diagnostic ultrasound: 4 Maximum size: 2.0 cm Location: Left; Mid ACR TI-RADS risk category: TR5 (>/= 7 points) Reason for biopsy: meets ACR TI-RADS criteria Ultrasound imaging confirms appropriate placement of the needles within the thyroid nodule. IMPRESSION: Technically successful ultrasound guided fine needle aspiration of left mid lobe thyroid nodule Read by Lavonia Drafts [facility] Electronically Signed   By: Sandi Mariscal M.D.   On: 03/30/2020 15:18   US THYROID  Result Date: 03/10/2020 CLINICAL DATA:  Incidental on PET. Hypermetabolic left-sided thyroid nodule demonstrated on PET-CT performed 03/05/2020 EXAM: THYROID ULTRASOUND TECHNIQUE: Ultrasound examination of the thyroid gland and adjacent soft tissues was performed. COMPARISON:  PET-CT-03/05/2020 FINDINGS: Parenchymal Echotexture: Moderately heterogenous Isthmus: Normal in size Right lobe: Normal in size measuring 4.8 x 2.1 x 1.2 cm Left lobe: Normal in size measuring 4.9 x 2.5 x 2.0 cm _________________________________________________________ Estimated total number of nodules >/= 1 cm: 5 Number of spongiform nodules >/=  2 cm not described below (TR1): Number of mixed cystic and solid nodules >/= 1.5 cm not described below (TR2): _________________________________________________________ There is an approximately 0.7 cm partially cystic though predominantly solid hypoechoic nodule within the superior pole the right lobe of the thyroid (labeled 1), which does not meet criteria to recommend percutaneous sampling or continued dedicated follow-up, _________________________________________________________ Nodule # 2: Location: Right; Mid Maximum size: 1.0 cm; Other 2 dimensions: 0.6 x 0.6 cm Composition: solid/almost completely solid (2) Echogenicity: hypoechoic (2) Shape: not taller-than-wide (0) Margins: smooth (0) Echogenic foci: punctate echogenic foci (3) ACR TI-RADS total points: 7. ACR TI-RADS risk category: TR5 (>/= 7 points). ACR TI-RADS  recommendations: **Given size (>/= 1.0 cm) and appearance, fine needle aspiration of this highly suspicious nodule should be considered based on TI-RADS criteria. _________________________________________________________ There is an approximately 1.5 x 1.0 x 1.0 cm minimally complex cyst within the inferior pole the right lobe of the thyroid (labeled 3), which does not meet criteria to recommend percutaneous sampling or continued dedicated follow-up. _________________________________________________________ Nodule # 4: Location: Left; Mid - this nodule likely correlates with the hypermetabolic nodule seen on preceding PET-CT Maximum size: 2.0 cm; Other 2 dimensions: 1.8 x 1.7 cm Composition: solid/almost completely solid (2) Echogenicity: hypoechoic (2) Shape: not taller-than-wide (0) Margins: smooth (0) Echogenic foci: punctate echogenic foci (3) ACR TI-RADS total points: 7. ACR TI-RADS risk category: TR5 (>/= 7 points). ACR TI-RADS recommendations: **Given size (>/= 1.0 cm) and appearance, fine needle aspiration of this highly suspicious nodule should be considered based on TI-RADS criteria. _________________________________________________________ There is an approximately 1.4 x 1.2 x 1.1 cm spongiform/benign-appearing nodule within mid aspect the left lobe of the thyroid (  labeled 5), which does not meet criteria to recommend percutaneous sampling or continued dedicated follow-up. There is an approximately 1.1 x 1.0 x 0.8 cm mixed cystic and solid nodule within the inferior pole of the left lobe of the thyroid (labeled 6), which does not meet criteria to recommend percutaneous sampling or continued dedicated follow-up. IMPRESSION: 1. Findings suggestive of multinodular goiter. 2. Nodules labeled #2 and #4 both meet imaging criteria to recommend ultrasound-guided fine-needle aspiration as indicated. Note, nodule #4 correlates with the hypermetabolic nodule seen on preceding PET-CT and hypermetabolic nodules tend  to harbor greater rates of malignancy than non hypermetabolic nodules. The above is in keeping with the ACR TI-RADS recommendations - J Am Coll Radiol 2017;14:587-595. Electronically Signed   By: Sandi Mariscal M.D.   On: 03/10/2020 14:15    ASSESSMENT AND PLAN:  This is a very pleasant 78 years old white female with metastatic non-small cell lung cancer, adenocarcinoma diagnosed in March 2012 with positive EGFR mutation status post 68 months of treatment with Tarceva discontinued secondary to disease progression and development of EGFR T790M resistant mutation. The patient is currently on treatment with Tagrisso 80 mg by mouth daily status post 42 months.   The patient continues to tolerate her treatment with Tagrisso fairly well with no concerning complaints. I recommended for her to stay on the same treatment for now. I will see her back for follow-up visit in 1 months for evaluation with repeat blood work. For the recurrent left pleural effusion, we will continue to monitor her closely and if the patient has any worsening shortness of breath we will repeat her imaging studies and evaluate her for need of repeat thoracentesis or Pleurx catheter placement. For the history of colon cancer, she is currently on observation and she has a colostomy after a small bowel obstruction. For the anemia, she will continue on the oral iron tablets for now. The patient was advised to call immediately if she has any concerning symptoms in the interval. The patient voices understanding of current disease status and treatment options and is in agreement with the current care plan. All questions were answered. The patient knows to call the clinic with any problems, questions or concerns. We can certainly see the patient much sooner if necessary.  Disclaimer: This note was dictated with voice recognition software. Similar sounding words can inadvertently be transcribed and may not be corrected upon review.

## 2020-04-06 NOTE — Progress Notes (Signed)
Spoke with Ms. Troupe today.  No barriers identified offered psycho-social support.

## 2020-04-25 ENCOUNTER — Other Ambulatory Visit: Payer: Self-pay | Admitting: Cardiovascular Disease

## 2020-04-25 ENCOUNTER — Telehealth: Payer: Self-pay | Admitting: Medical Oncology

## 2020-04-25 ENCOUNTER — Other Ambulatory Visit: Payer: Self-pay | Admitting: Internal Medicine

## 2020-04-25 MED ORDER — OXYCODONE-ACETAMINOPHEN 5-325 MG PO TABS: 1.0000 | ORAL_TABLET | Freq: Three times a day (TID) | ORAL | 0 refills | Status: DC | PRN

## 2020-04-25 MED ORDER — AMIODARONE HCL 200 MG PO TABS: 200.0000 mg | ORAL_TABLET | Freq: Every day | ORAL | 2 refills | Status: DC

## 2020-04-25 NOTE — Telephone Encounter (Signed)
I did send Percocet to her pharmacy.  Thank you.

## 2020-04-25 NOTE — Telephone Encounter (Signed)
Patient informed that she is to continue amiodaraone and refills sent to her pharmacy. Verbalized understanding of plan.

## 2020-04-25 NOTE — Telephone Encounter (Signed)
New message    Please call is patient suppose to continue taking  amiodarone (PACERONE) 200 MG tablet Take 1 tablet (200 mg total) by mouth daily.    If she needs to continue this meds please call into [facility] on [address] [place]  - 90 day supply

## 2020-04-25 NOTE — Telephone Encounter (Signed)
Going out of town and would like a stronger  pain med  so she will have it if she needs it. Marland Kitchen

## 2020-05-02 ENCOUNTER — Encounter (HOSPITAL_COMMUNITY): Payer: Self-pay

## 2020-05-10 ENCOUNTER — Encounter: Payer: Self-pay | Admitting: Internal Medicine

## 2020-05-10 ENCOUNTER — Inpatient Hospital Stay (HOSPITAL_BASED_OUTPATIENT_CLINIC_OR_DEPARTMENT_OTHER): Payer: Medicare Other | Admitting: Internal Medicine

## 2020-05-10 ENCOUNTER — Other Ambulatory Visit: Payer: Self-pay

## 2020-05-10 ENCOUNTER — Telehealth: Payer: Self-pay | Admitting: Internal Medicine

## 2020-05-10 ENCOUNTER — Inpatient Hospital Stay: Payer: Medicare Other | Attending: Internal Medicine

## 2020-05-10 VITALS — BP 159/69 | HR 73 | Temp 98.1°F | Resp 16 | Ht 66.0 in | Wt 121.4 lb

## 2020-05-10 DIAGNOSIS — Z85118 Personal history of other malignant neoplasm of bronchus and lung: Secondary | ICD-10-CM | POA: Diagnosis not present

## 2020-05-10 DIAGNOSIS — C182 Malignant neoplasm of ascending colon: Secondary | ICD-10-CM | POA: Diagnosis not present

## 2020-05-10 DIAGNOSIS — Z8673 Personal history of transient ischemic attack (TIA), and cerebral infarction without residual deficits: Secondary | ICD-10-CM | POA: Insufficient documentation

## 2020-05-10 DIAGNOSIS — C3490 Malignant neoplasm of unspecified part of unspecified bronchus or lung: Secondary | ICD-10-CM | POA: Diagnosis not present

## 2020-05-10 DIAGNOSIS — Z170 Estrogen receptor positive status [ER+]: Secondary | ICD-10-CM | POA: Diagnosis not present

## 2020-05-10 DIAGNOSIS — Z5111 Encounter for antineoplastic chemotherapy: Secondary | ICD-10-CM

## 2020-05-10 DIAGNOSIS — Z79899 Other long term (current) drug therapy: Secondary | ICD-10-CM | POA: Insufficient documentation

## 2020-05-10 DIAGNOSIS — J90 Pleural effusion, not elsewhere classified: Secondary | ICD-10-CM

## 2020-05-10 DIAGNOSIS — Z85038 Personal history of other malignant neoplasm of large intestine: Secondary | ICD-10-CM | POA: Diagnosis not present

## 2020-05-10 DIAGNOSIS — Z933 Colostomy status: Secondary | ICD-10-CM | POA: Insufficient documentation

## 2020-05-10 DIAGNOSIS — C3412 Malignant neoplasm of upper lobe, left bronchus or lung: Secondary | ICD-10-CM | POA: Diagnosis not present

## 2020-05-10 DIAGNOSIS — Z9013 Acquired absence of bilateral breasts and nipples: Secondary | ICD-10-CM | POA: Insufficient documentation

## 2020-05-10 DIAGNOSIS — C50919 Malignant neoplasm of unspecified site of unspecified female breast: Secondary | ICD-10-CM | POA: Insufficient documentation

## 2020-05-10 DIAGNOSIS — F419 Anxiety disorder, unspecified: Secondary | ICD-10-CM | POA: Insufficient documentation

## 2020-05-10 DIAGNOSIS — D638 Anemia in other chronic diseases classified elsewhere: Secondary | ICD-10-CM | POA: Insufficient documentation

## 2020-05-10 DIAGNOSIS — Z7901 Long term (current) use of anticoagulants: Secondary | ICD-10-CM | POA: Diagnosis not present

## 2020-05-10 DIAGNOSIS — Z79811 Long term (current) use of aromatase inhibitors: Secondary | ICD-10-CM | POA: Insufficient documentation

## 2020-05-10 LAB — CMP (CANCER CENTER ONLY)
ALT: 13 U/L (ref 0–44)
AST: 15 U/L (ref 15–41)
Albumin: 3.4 g/dL — ABNORMAL LOW (ref 3.5–5.0)
Alkaline Phosphatase: 105 U/L (ref 38–126)
Anion gap: 10 (ref 5–15)
BUN: 20 mg/dL (ref 8–23)
CO2: 29 mmol/L (ref 22–32)
Calcium: 10.7 mg/dL — ABNORMAL HIGH (ref 8.9–10.3)
Chloride: 101 mmol/L (ref 98–111)
Creatinine: 1.13 mg/dL — ABNORMAL HIGH (ref 0.44–1.00)
GFR, Est AFR Am: 53 mL/min — ABNORMAL LOW (ref >60)
GFR, Estimated: 46 mL/min — ABNORMAL LOW (ref >60)
Glucose, Bld: 100 mg/dL — ABNORMAL HIGH (ref 70–99)
Potassium: 4.1 mmol/L (ref 3.5–5.1)
Sodium: 140 mmol/L (ref 135–145)
Total Bilirubin: 0.3 mg/dL (ref 0.3–1.2)
Total Protein: 7 g/dL (ref 6.5–8.1)

## 2020-05-10 LAB — CBC WITH DIFFERENTIAL (CANCER CENTER ONLY)
Abs Immature Granulocytes: 0.01 10*3/uL (ref 0.00–0.07)
Basophils Absolute: 0 10*3/uL (ref 0.0–0.1)
Basophils Relative: 1 %
Eosinophils Absolute: 0.1 10*3/uL (ref 0.0–0.5)
Eosinophils Relative: 2 %
HCT: 33.8 % — ABNORMAL LOW (ref 36.0–46.0)
Hemoglobin: 10.7 g/dL — ABNORMAL LOW (ref 12.0–15.0)
Immature Granulocytes: 0 %
Lymphocytes Relative: 17 %
Lymphs Abs: 1 10*3/uL (ref 0.7–4.0)
MCH: 30.2 pg (ref 26.0–34.0)
MCHC: 31.7 g/dL (ref 30.0–36.0)
MCV: 95.5 fL (ref 80.0–100.0)
Monocytes Absolute: 0.9 10*3/uL (ref 0.1–1.0)
Monocytes Relative: 15 %
Neutro Abs: 4 10*3/uL (ref 1.7–7.7)
Neutrophils Relative %: 65 %
Platelet Count: 310 10*3/uL (ref 150–400)
RBC: 3.54 MIL/uL — ABNORMAL LOW (ref 3.87–5.11)
RDW: 15.9 % — ABNORMAL HIGH (ref 11.5–15.5)
WBC Count: 6.1 10*3/uL (ref 4.0–10.5)
nRBC: 0 % (ref 0.0–0.2)

## 2020-05-10 NOTE — Progress Notes (Signed)
Kristin [name] [phone]   [phone]  OFFICE PROGRESS NOTE  Kristin Hong, MD [address] [place] [address]  DIAGNOSIS:  1) Metastatic non-small cell lung cancer, adenocarcinoma diagnosed in March 2012 with positive EGFR mutation in exon 21 (L858R). 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 2) adenocarcinoma of the ascending colon (T2, N0, M0) with MSI high diagnosed in March 2017. 3) history of breast adenocarcinoma.  PRIOR THERAPY:  1) Tarceva 150 mg by mouth daily status post 68 months of treatment. 2) Status post right colectomy with lymph node dissection in May 2017.  CURRENT THERAPY: 1) Tagrisso 80 mg by mouth daily started 11/09/2016.  Status post 43 months of treatment. 2) observation for the history of colon adenocarcinoma. 3) Femara 2.5 mg by mouth daily for history of breast cancer.  INTERVAL HISTORY: Kristin [name] 79 y.o. female returns to the clinic today for follow-up visit accompanied by her daughter Kristin [name].  The patient is feeling fine today with no concerning complaints except for fatigue as well as intermittent pain in the left side of the chest.  She was given prescription with Percocet but she could not tolerate it.  She continues to use Tylenol on as-needed basis.  She denied having any shortness of breath, cough or hemoptysis.  She denied having any fever or chills.  She has no nausea, vomiting, diarrhea or constipation.  She continues to tolerate her treatment with Tagrisso fairly well.  The patient is here today for evaluation and repeat blood work.   MEDICAL HISTORY: Past Medical History:  Diagnosis Date  . Allergy   . Anemia   . Anemia of chronic disease 09/21/2016  . Anxiety   . Arthritis    arthritis- left hip  . Blood transfusion without reported diagnosis    transfusion- 3-4 yrs ago -found to be anemic on routine lab check  . Breast cancer ([facility]) 2002   bilateral-  bilateral mastectomies done.  . 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 CVA (cerebral infarction) 04/30/2013  . Encounter for antineoplastic chemotherapy 10/28/2016  . Family history of brain cancer   . Family history of breast cancer   . Family history of colon cancer 10/06/2009  . Family history of colon cancer   . Family history of prostate cancer   . GERD (gastroesophageal reflux disease)   . Glaucoma   . History of bilateral mastectomy 05/20/2010  . Hypertension   . Lung cancer ([facility]) dx'd 12/2010   last Ct scan "no lung cancer" showing 11'16 CT Chest Epic.  . Malignant neoplasm of ascending colon ([facility]) 01/29/2016  . Stroke [facility])    2 yrs ago-no residual  . Tubular adenoma of colon 07/2011   colon polyps ans reoccurence with malignancy found    ALLERGIES:  is allergic to doxycycline and sulfa antibiotics.  MEDICATIONS:  Current Outpatient Medications  Medication Sig Dispense Refill  . amiodarone (PACERONE) 200 MG tablet Take 1 tablet (200 mg total) by mouth daily. 90 tablet 2  . brimonidine-timolol (COMBIGAN) 0.2-0.5 % ophthalmic solution Place 1 drop into the right eye every 12 (twelve) hours.    . Cholecalciferol (VITAMIN D3) 50 MCG (2000 UT) capsule Take 1 capsule (2,000 Units total) by mouth daily. 90 capsule 0  . clopidogrel (PLAVIX) 75 MG tablet Take 75 mg by mouth daily.    . cycloSPORINE (RESTASIS) 0.05 % ophthalmic emulsion Place 1 drop into both eyes 2 (two) times daily.    Marland Kitchen  escitalopram (LEXAPRO) 10 MG tablet Take 10 mg by mouth daily.     . famotidine (PEPCID) 40 MG tablet Take 40 mg by mouth daily.    . feeding supplement, ENSURE ENLIVE, (ENSURE ENLIVE) LIQD Take 237 mLs by mouth 2 (two) times daily between meals. 237 mL 12  . FeFum-FePoly-FA-B Cmp-C-Biot (FOLIVANE-PLUS) CAPS TAKE 1 CAPSULE BY MOUTH EVERY DAY IN THE MORNING (Patient taking differently: Take 1 capsule by mouth daily. ) 90 capsule 2  . furosemide (LASIX) 20 MG tablet Take 20 mg by mouth.    . letrozole  (FEMARA) 2.5 MG tablet TAKE 1 TABLET BY MOUTH EVERY DAY (Patient taking differently: Take 2.5 mg by mouth daily. ) 90 tablet 1  . mirtazapine (REMERON) 7.5 MG tablet Take 7.5 mg by mouth at bedtime. (Patient not taking: Reported on 04/06/2020)    . Multiple Vitamins-Iron (MULTIVITAMINS WITH IRON) TABS tablet Take 1 tablet by mouth daily.  0  . Multiple Vitamins-Minerals (PRESERVISION AREDS 2) CAPS Take by mouth.    Marland Kitchen osimertinib mesylate (TAGRISSO) 80 MG tablet Take 1 tablet (80 mg total) by mouth daily. 30 tablet 3  . oxyCODONE-acetaminophen (PERCOCET/ROXICET) 5-325 MG tablet Take 1 tablet by mouth every 8 (eight) hours as needed for severe pain. 30 tablet 0  . prochlorperazine (COMPAZINE) 10 MG tablet Take 1 tablet (10 mg total) by mouth every 6 (six) hours as needed for nausea or vomiting. 30 tablet 0  . rivaroxaban (XARELTO) 20 MG TABS tablet Take 20 mg by mouth daily with supper.     Current Facility-Administered Medications  Medication Dose Route Frequency Provider Last Rate Last Admin  . 0.9 %  sodium chloride infusion  500 mL Intravenous Continuous Armbruster, Carlota Raspberry, MD        SURGICAL HISTORY:  Past Surgical History:  Procedure Laterality Date  . APPENDECTOMY    . BOWEL DECOMPRESSION N/A 06/12/2018   Procedure: BOWEL DECOMPRESSION;  Surgeon: Rush Landmark Telford Nab., MD;  Location: [facility];  Service: Gastroenterology;  Laterality: N/A;  . cataracts Bilateral   . COLECTOMY WITH COLOSTOMY CREATION/HARTMANN PROCEDURE  06/16/2018   Procedure: CREATION/HARTMANN COLOSTOMY;  Surgeon: Michael Boston, MD;  Location: [facility];  Service: General;;  . COLONOSCOPY    . COLOSTOMY N/A 06/16/2018   Procedure: POSSIBLE COLOSTOMY;  Surgeon: Michael Boston, MD;  Location: [facility];  Service: General;  Laterality: N/A;  . EYE SURGERY    . FLEXIBLE SIGMOIDOSCOPY N/A 06/12/2018   Procedure: FLEXIBLE SIGMOIDOSCOPY;  Surgeon: Rush Landmark Telford Nab., MD;  Location: [facility];  Service: Gastroenterology;   Laterality: N/A;  . LAPAROSCOPIC SIGMOID COLECTOMY N/A 06/16/2018   Procedure: LAPAROSCOPIC LOW ANTERIOR RESECTION;  Surgeon: Michael Boston, MD;  Location: [facility];  Service: General;  Laterality: N/A;  . LUNG SURGERY     Resection -" not done. Pt . tx with Tarceva  . MASTECTOMY Bilateral   . MEDIASTINOSCOPY N/A 10/02/2016   Procedure: MEDIASTINOSCOPY;  Surgeon: [name], MD;  Location: [facility] OR;  Service: Thoracic;  Laterality: N/A;  . RECONSTRUCTION BREAST W/ TRAM FLAP Bilateral    bilaterally  . robotic right hemicolectomy Right 03/06/2016   Dr. Johney Maine  . TEE WITHOUT CARDIOVERSION N/A 06/04/2013   Procedure: TRANSESOPHAGEAL ECHOCARDIOGRAM (TEE);  Surgeon: Jolaine Artist, MD;  Location: [facility];  Service: Cardiovascular;  Laterality: N/A;  . TONSILLECTOMY    . TOTAL HIP ARTHROPLASTY Left 08/07/2016   Procedure: LEFT TOTAL HIP ARTHROPLASTY ANTERIOR APPROACH;  Surgeon: Gaynelle Arabian, MD;  Location: [facility];  Service: Orthopedics;  Laterality: Left;    REVIEW OF SYSTEMS:  A comprehensive review of systems was negative except for: Constitutional: positive for fatigue Respiratory: positive for pleurisy/chest pain   PHYSICAL EXAMINATION: General appearance: alert, cooperative, fatigued and no distress Head: Normocephalic, without obvious abnormality, atraumatic Neck: no adenopathy, no JVD, supple, symmetrical, trachea midline and thyroid not enlarged, symmetric, no tenderness/mass/nodules Lymph nodes: Cervical, supraclavicular, and axillary nodes normal. Resp: clear to auscultation bilaterally Back: symmetric, no curvature. ROM normal. No CVA tenderness. Cardio: regular rate and rhythm, S1, S2 normal, no murmur, click, rub or gallop GI: soft, non-tender; bowel sounds normal; no masses,  no organomegaly Extremities: extremities normal, atraumatic, no cyanosis or edema  ECOG PERFORMANCE STATUS: 1 - Symptomatic but completely ambulatory  Blood pressure (!) 159/69, pulse 73,  temperature 98.1 F (36.7 C), temperature source Temporal, resp. rate 16, height 5' 6"  (1.676 m), weight 121 lb 6.4 oz (55.1 kg), SpO2 100 %.  LABORATORY DATA: Lab Results  Component Value Date   WBC 5.8 04/06/2020   HGB 9.4 (L) 04/06/2020   HCT 30.7 (L) 04/06/2020   MCV 95.0 04/06/2020   PLT 295 04/06/2020      Chemistry      Component Value Date/Time   NA 141 04/06/2020 0959   NA 138 09/26/2017 0942   K 3.6 04/06/2020 0959   K 5.2 No visable hemolysis (H) 09/26/2017 0942   CL 102 04/06/2020 0959   CL 107 04/05/2013 0754   CO2 33 (H) 04/06/2020 0959   CO2 26 09/26/2017 0942   BUN 18 04/06/2020 0959   BUN 16.0 09/26/2017 0942   CREATININE 1.06 (H) 04/06/2020 0959   CREATININE 1.2 (H) 09/26/2017 0942      Component Value Date/Time   CALCIUM 9.7 04/06/2020 0959   CALCIUM 9.9 09/26/2017 0942   ALKPHOS 114 04/06/2020 0959   ALKPHOS 68 09/26/2017 0942   AST 17 04/06/2020 0959   AST 18 09/26/2017 0942   ALT 17 04/06/2020 0959   ALT 13 09/26/2017 0942   BILITOT 0.3 04/06/2020 0959   BILITOT 0.31 09/26/2017 0942       RADIOGRAPHIC STUDIES: No results found.  ASSESSMENT AND PLAN:  This is a very pleasant 79 years old white female with metastatic non-small cell lung cancer, adenocarcinoma diagnosed in March 2012 with positive EGFR mutation status post 68 months of treatment with Tarceva discontinued secondary to disease progression and development of EGFR T790M resistant mutation. The patient is currently on treatment with Tagrisso 80 mg by mouth daily status post 43 months.   The patient continues to tolerate this treatment well with no concerning adverse effects. I recommended for her to continue her current treatment with Tagrisso with the same dose. I will see her back for follow-up visit in 1 months for evaluation with repeat CT scan of the chest, abdomen pelvis for restaging of her disease. If the patient has recurrent left pleural effusion, I will send her back to  Dr. Roxan Hockey for consideration of Pleurx catheter placement. For the history of colon cancer, she is currently on observation and she has a colostomy after a small bowel obstruction. For the anemia, she will continue on the oral iron tablets for now. For the hypercalcemia, she is currently on vitamin D supplements.  We will continue to monitor closely. The patient was advised to call immediately if she has any concerning symptoms in the interval. The patient voices understanding of current disease status and treatment options and is in agreement  with the current care plan. All questions were answered. The patient knows to call the clinic with any problems, questions or concerns. We can certainly see the patient much sooner if necessary.  Disclaimer: This note was dictated with voice recognition software. Similar sounding words can inadvertently be transcribed and may not be corrected upon review.

## 2020-05-10 NOTE — Telephone Encounter (Signed)
scheduld per 7/21 los. Printed avs and calendar for pt.

## 2020-05-18 ENCOUNTER — Telehealth: Payer: Self-pay

## 2020-05-18 ENCOUNTER — Other Ambulatory Visit: Payer: Self-pay | Admitting: *Deleted

## 2020-05-18 ENCOUNTER — Other Ambulatory Visit: Payer: Self-pay | Admitting: Pharmacist

## 2020-05-18 DIAGNOSIS — C182 Malignant neoplasm of ascending colon: Secondary | ICD-10-CM

## 2020-05-18 DIAGNOSIS — C50112 Malignant neoplasm of central portion of left female breast: Secondary | ICD-10-CM

## 2020-05-18 DIAGNOSIS — C3412 Malignant neoplasm of upper lobe, left bronchus or lung: Secondary | ICD-10-CM

## 2020-05-18 DIAGNOSIS — Z170 Estrogen receptor positive status [ER+]: Secondary | ICD-10-CM

## 2020-05-18 DIAGNOSIS — Z5111 Encounter for antineoplastic chemotherapy: Secondary | ICD-10-CM

## 2020-05-18 DIAGNOSIS — D638 Anemia in other chronic diseases classified elsewhere: Secondary | ICD-10-CM

## 2020-05-18 MED ORDER — OSIMERTINIB MESYLATE 80 MG PO TABS: 80.0000 mg | ORAL_TABLET | Freq: Every day | ORAL | 3 refills | Status: DC

## 2020-05-18 NOTE — Progress Notes (Signed)
Oral Oncology Pharmacist Encounter  Prescription refill for Tagrisso (osimertinib) sent to [facility] in error. Patient enrolled in manufacturer assistance and receives medication through [facility]. Prescription redirected to [facility].  Leron Croak, PharmD, BCPS Hematology/Oncology Clinical Pharmacist [facility] [phone] 05/18/2020 4:33 PM

## 2020-05-18 NOTE — Telephone Encounter (Signed)
Oral Oncology Patient Advocate Encounter  Received notification from [facility] that prior authorization for Tagrisso is required.  PA submitted on CoverMyMeds Key [id number] Status is pending  Oral Oncology Clinic will continue to follow.  [facility] Patient Kristin [name] Phone [phone] Fax [phone] 05/18/2020 4:18 PM

## 2020-05-19 NOTE — Telephone Encounter (Signed)
Oral Oncology Patient Advocate Encounter  Prior Authorization for [name] has been approved.    PA# [id number] Effective dates: 05/18/20 through further notice  Patients co-pay is $1928.97  Oral Oncology Clinic will continue to follow.   [place] Patient [place] Phone [phone] Fax [phone] 05/19/2020 9:48 AM

## 2020-05-29 ENCOUNTER — Other Ambulatory Visit: Payer: Self-pay | Admitting: *Deleted

## 2020-05-29 ENCOUNTER — Telehealth: Payer: Self-pay | Admitting: Internal Medicine

## 2020-05-29 ENCOUNTER — Telehealth: Payer: Self-pay | Admitting: *Deleted

## 2020-05-29 DIAGNOSIS — C3412 Malignant neoplasm of upper lobe, left bronchus or lung: Secondary | ICD-10-CM

## 2020-05-29 NOTE — Telephone Encounter (Signed)
Scheduled per sch msg. Called and spoke with patient. Confirmed appt  

## 2020-05-29 NOTE — Telephone Encounter (Signed)
Pt son in law called stating pt SOB has increased as well as pain. Scheduled appt with [facility] tomorrow per Lucianne Lei. Scheduling message sent to add appt, labs and to call pt

## 2020-05-30 ENCOUNTER — Other Ambulatory Visit: Payer: Self-pay

## 2020-05-30 ENCOUNTER — Inpatient Hospital Stay: Payer: Medicare Other | Attending: Internal Medicine | Admitting: Medical

## 2020-05-30 ENCOUNTER — Encounter: Payer: Self-pay | Admitting: Medical

## 2020-05-30 ENCOUNTER — Inpatient Hospital Stay: Payer: Medicare Other

## 2020-05-30 ENCOUNTER — Ambulatory Visit: Payer: Medicare Other | Admitting: Physician Assistant

## 2020-05-30 VITALS — BP 149/62 | HR 60 | Temp 97.7°F | Ht 66.0 in | Wt 123.6 lb

## 2020-05-30 DIAGNOSIS — Z79811 Long term (current) use of aromatase inhibitors: Secondary | ICD-10-CM | POA: Insufficient documentation

## 2020-05-30 DIAGNOSIS — Z85038 Personal history of other malignant neoplasm of large intestine: Secondary | ICD-10-CM | POA: Insufficient documentation

## 2020-05-30 DIAGNOSIS — Z803 Family history of malignant neoplasm of breast: Secondary | ICD-10-CM | POA: Insufficient documentation

## 2020-05-30 DIAGNOSIS — Z8042 Family history of malignant neoplasm of prostate: Secondary | ICD-10-CM | POA: Diagnosis not present

## 2020-05-30 DIAGNOSIS — Z800 Family history of malignant neoplasm of digestive organs: Secondary | ICD-10-CM | POA: Diagnosis not present

## 2020-05-30 DIAGNOSIS — R569 Unspecified convulsions: Secondary | ICD-10-CM | POA: Diagnosis not present

## 2020-05-30 DIAGNOSIS — Z9049 Acquired absence of other specified parts of digestive tract: Secondary | ICD-10-CM | POA: Diagnosis not present

## 2020-05-30 DIAGNOSIS — Z801 Family history of malignant neoplasm of trachea, bronchus and lung: Secondary | ICD-10-CM | POA: Diagnosis not present

## 2020-05-30 DIAGNOSIS — C3412 Malignant neoplasm of upper lobe, left bronchus or lung: Secondary | ICD-10-CM

## 2020-05-30 DIAGNOSIS — Z9013 Acquired absence of bilateral breasts and nipples: Secondary | ICD-10-CM | POA: Diagnosis not present

## 2020-05-30 DIAGNOSIS — Z8249 Family history of ischemic heart disease and other diseases of the circulatory system: Secondary | ICD-10-CM | POA: Insufficient documentation

## 2020-05-30 DIAGNOSIS — Z79899 Other long term (current) drug therapy: Secondary | ICD-10-CM | POA: Diagnosis not present

## 2020-05-30 DIAGNOSIS — Z8673 Personal history of transient ischemic attack (TIA), and cerebral infarction without residual deficits: Secondary | ICD-10-CM | POA: Insufficient documentation

## 2020-05-30 DIAGNOSIS — Z853 Personal history of malignant neoplasm of breast: Secondary | ICD-10-CM | POA: Diagnosis not present

## 2020-05-30 DIAGNOSIS — C50919 Malignant neoplasm of unspecified site of unspecified female breast: Secondary | ICD-10-CM | POA: Diagnosis present

## 2020-05-30 DIAGNOSIS — Z808 Family history of malignant neoplasm of other organs or systems: Secondary | ICD-10-CM | POA: Diagnosis not present

## 2020-05-30 LAB — CMP (CANCER CENTER ONLY)
ALT: 9 U/L (ref 0–44)
AST: 14 U/L — ABNORMAL LOW (ref 15–41)
Albumin: 3.2 g/dL — ABNORMAL LOW (ref 3.5–5.0)
Alkaline Phosphatase: 98 U/L (ref 38–126)
Anion gap: 7 (ref 5–15)
BUN: 23 mg/dL (ref 8–23)
CO2: 28 mmol/L (ref 22–32)
Calcium: 10.4 mg/dL — ABNORMAL HIGH (ref 8.9–10.3)
Chloride: 104 mmol/L (ref 98–111)
Creatinine: 1.16 mg/dL — ABNORMAL HIGH (ref 0.44–1.00)
GFR, Est AFR Am: 51 mL/min — ABNORMAL LOW (ref >60)
GFR, Estimated: 44 mL/min — ABNORMAL LOW (ref >60)
Glucose, Bld: 84 mg/dL (ref 70–99)
Potassium: 4.9 mmol/L (ref 3.5–5.1)
Sodium: 139 mmol/L (ref 135–145)
Total Bilirubin: 0.2 mg/dL — ABNORMAL LOW (ref 0.3–1.2)
Total Protein: 6.5 g/dL (ref 6.5–8.1)

## 2020-05-30 LAB — CBC WITH DIFFERENTIAL (CANCER CENTER ONLY)
Abs Immature Granulocytes: 0.02 10*3/uL (ref 0.00–0.07)
Basophils Absolute: 0.1 10*3/uL (ref 0.0–0.1)
Basophils Relative: 1 %
Eosinophils Absolute: 0.2 10*3/uL (ref 0.0–0.5)
Eosinophils Relative: 4 %
HCT: 33.6 % — ABNORMAL LOW (ref 36.0–46.0)
Hemoglobin: 10.6 g/dL — ABNORMAL LOW (ref 12.0–15.0)
Immature Granulocytes: 0 %
Lymphocytes Relative: 17 %
Lymphs Abs: 1.1 10*3/uL (ref 0.7–4.0)
MCH: 30.5 pg (ref 26.0–34.0)
MCHC: 31.5 g/dL (ref 30.0–36.0)
MCV: 96.8 fL (ref 80.0–100.0)
Monocytes Absolute: 1.1 10*3/uL — ABNORMAL HIGH (ref 0.1–1.0)
Monocytes Relative: 17 %
Neutro Abs: 4 10*3/uL (ref 1.7–7.7)
Neutrophils Relative %: 61 %
Platelet Count: 288 10*3/uL (ref 150–400)
RBC: 3.47 MIL/uL — ABNORMAL LOW (ref 3.87–5.11)
RDW: 14.6 % (ref 11.5–15.5)
WBC Count: 6.5 10*3/uL (ref 4.0–10.5)
nRBC: 0 % (ref 0.0–0.2)

## 2020-05-30 LAB — SAMPLE TO BLOOD BANK

## 2020-05-30 NOTE — Progress Notes (Signed)
[facility] Progress Note   Kristin [name] [id number] 1941-07-05 79 y.o.  Kristin [name] is managed by Dr. Fanny Bien. Kristin [name]  Actively treated with chemotherapy/immunotherapy/hormonal therapy: yes  Current therapy: Tagrisso 80 mg by mouth daily   Next scheduled appointment with provider: 06/13/2020  Assessment: Plan:    Cancer of upper lobe of left lung ([place]) - Plan: MR Brain W Contrast  Seizure ([place]) - Plan: MR Brain W Contrast  Please see After Visit Summary for patient specific instructions.  Future Appointments  Date Time Provider [place]  06/09/2020 10:30 AM [facility] 6 [facility] None  06/12/2020  2:00 PM [facility] 2 [facility] [place]  06/13/2020 10:30 AM Curt Bears, MD [facility] None  08/04/2020 12:30 PM Hayden Pedro, MD [facility] None  10/12/2020 10:20 AM Branch, Alphonse Guild, MD [facility]    Orders Placed This Encounter  Procedures  . MR Brain W Contrast       Subjective:   Patient ID:  Kristin [name] is a 79 y.o. (DOB 1941-06-29) female.  Chief Complaint:  Chief Complaint  Patient presents with  . Shortness of Breath    HPI Kristin [name]  is a 79 y.o. female with a diagnosis of a metastatic adenocarcinoma of the left lung. She is followed by Dr. Julien Nordmann and is currently treated with Tagrisso 80 mg by mouth daily. She additionally has been diagnosed and treated for an adenocarcinoma of the ascending colon in March 2017 and a history of a breast adenocarcinoma. She presents to the office today after calling yesterday with a report of increased shortness of breath and pain. Patient presents to the clinic today with her daughter. Kristin [name] reports that she has had some increasing shortness of breath and wonders if she has increased fluid retention. On last Friday while at home she had a brief episode of seizure-like activity. She was standing holding the frame to the door when she began to shake all over. The  patient remembers the event and had no loss of consciousness. The patient and her daughter presented to the ER for evaluation but did not state since the wait was very long. She reports having pain on her left side which is worsened from her baseline. She reports that her pain is from the back to her breast now. They presented today because they were concerned that they should not wait until 2 to 3 weeks since they see Dr. Julien Nordmann again on  06/13/2020.   Medications: I have reviewed the patient's current medications.  Allergies:  Allergies  Allergen Reactions  . Doxycycline Nausea Only    Weak, stomach  . Sulfa Antibiotics Rash    Past Medical History:  Diagnosis Date  . Allergy   . Anemia   . Anemia of chronic disease 09/21/2016  . Anxiety   . Arthritis    arthritis- left hip  . Blood transfusion without reported diagnosis    transfusion- 3-4 yrs ago -found to be anemic on routine lab check  . Breast cancer ([place]) 2002   bilateral- bilateral mastectomies done.  . 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 CVA (cerebral infarction) 04/30/2013  . Encounter for antineoplastic chemotherapy 10/28/2016  . Family history of brain cancer   . Family history of breast cancer   . Family history of colon cancer 10/06/2009  . Family history of colon cancer   . Family history of prostate cancer   . GERD (gastroesophageal reflux disease)   . Glaucoma   . History of bilateral  mastectomy 05/20/2010  . Hypertension   . Lung cancer ([place]) dx'd 12/2010   last Ct scan "no lung cancer" showing 11'16 CT Chest Epic.  . Malignant neoplasm of ascending colon ([place]) 01/29/2016  . Stroke [facility])    2 yrs ago-no residual  . Tubular adenoma of colon 07/2011   colon polyps ans reoccurence with malignancy found    Past Surgical History:  Procedure Laterality Date  . APPENDECTOMY    . BOWEL DECOMPRESSION N/A 06/12/2018   Procedure: BOWEL DECOMPRESSION;  Surgeon: Rush Landmark Telford Nab., MD;  Location: [facility];   Service: Gastroenterology;  Laterality: N/A;  . cataracts Bilateral   . COLECTOMY WITH COLOSTOMY CREATION/HARTMANN PROCEDURE  06/16/2018   Procedure: CREATION/HARTMANN COLOSTOMY;  Surgeon: Michael Boston, MD;  [facility];  Service: General;;  . COLONOSCOPY    . COLOSTOMY N/A 06/16/2018   Procedure: POSSIBLE COLOSTOMY;  Surgeon: Michael Boston, MD;  [facility];  Service: General;  Laterality: N/A;  . EYE SURGERY    . FLEXIBLE SIGMOIDOSCOPY N/A 06/12/2018   Procedure: FLEXIBLE SIGMOIDOSCOPY;  Surgeon: Rush Landmark Telford Nab., MD;  Location: [facility];  Service: Gastroenterology;  Laterality: N/A;  . LAPAROSCOPIC SIGMOID COLECTOMY N/A 06/16/2018   Procedure: LAPAROSCOPIC LOW ANTERIOR RESECTION;  Surgeon: Michael Boston, MD;  [facility];  Service: General;  Laterality: N/A;  . LUNG SURGERY     Resection -" not done. Pt . tx with Tarceva  . MASTECTOMY Bilateral   . MEDIASTINOSCOPY N/A 10/02/2016   Procedure: MEDIASTINOSCOPY;  Surgeon: [name], MD;  Location: [facility] OR;  Service: Thoracic;  Laterality: N/A;  . RECONSTRUCTION BREAST W/ TRAM FLAP Bilateral    bilaterally  . robotic right hemicolectomy Right 03/06/2016   Dr. Johney Maine  . TEE WITHOUT CARDIOVERSION N/A 06/04/2013   Procedure: TRANSESOPHAGEAL ECHOCARDIOGRAM (TEE);  Surgeon: Jolaine Artist, MD;  Location: [facility] ENDOSCOPY;  Service: Cardiovascular;  Laterality: N/A;  . TONSILLECTOMY    . TOTAL HIP ARTHROPLASTY Left 08/07/2016   Procedure: LEFT TOTAL HIP ARTHROPLASTY ANTERIOR APPROACH;  Surgeon: Gaynelle Arabian, MD;  [facility];  Service: Orthopedics;  Laterality: Left;    Family History  Problem Relation Age of Onset  . Lung cancer Father   . Heart disease Mother   . Clotting disorder Mother 26       coronary thrombosis  . Colon cancer Sister        dx in her 56s  . Breast cancer Sister 4  . Multiple myeloma Paternal Grandmother   . Brain cancer Paternal Uncle   . Cancer Paternal Aunt        unknown   . Colon cancer Maternal Aunt 80  . Prostate cancer Maternal Uncle        dx in his late 86s  . Other Maternal Uncle        brain tumor dx in his 61s  . Lung cancer Maternal Uncle   . Cancer Maternal Uncle        NOS  . Stomach cancer Paternal Aunt   . Brain cancer Cousin        maternal first cousin dx in late 11s-50s  . Cancer Paternal Uncle        NOS  . Colon cancer Cousin        paternal cousin dx in his 55s  . Cancer Cousin        paternal cousin dx with NOS cancer    Social History   Socioeconomic History  . Marital status:  Married    Spouse name: Not on file  . Number of children: 2  . Years of education: college  . Highest education level: Not on file  Occupational History  . Occupation: Retired    Fish farm manager: RETIRED  Tobacco Use  . Smoking status: Never Smoker  . Smokeless tobacco: Never Used  Vaping Use  . Vaping Use: Never used  Substance and Sexual Activity  . Alcohol use: Yes    Alcohol/week: 0.0 standard drinks    Comment: ocassional beer  . Drug use: No  . Sexual activity: Not Currently  Other Topics Concern  . Not on file  Social History Narrative   Patient lives at home with her husband. Patient drinks caffinated  Drinks daily.   Social Determinants of Health   Financial Resource Strain:   . Difficulty of Paying Living Expenses:   Food Insecurity:   . Worried About Charity fundraiser in the Last Year:   . Arboriculturist in the Last Year:   Transportation Needs:   . Film/video editor (Medical):   Marland Kitchen Lack of Transportation (Non-Medical):   Physical Activity:   . Days of Exercise per Week:   . Minutes of Exercise per Session:   Stress:   . Feeling of Stress :   Social Connections:   . Frequency of Communication with Friends and Family:   . Frequency of Social Gatherings with Friends and Family:   . Attends Religious Services:   . Active Member of Clubs or Organizations:   . Attends Archivist Meetings:   Marland Kitchen Marital  Status:   Intimate Partner Violence:   . Fear of Current or Ex-Partner:   . Emotionally Abused:   Marland Kitchen Physically Abused:   . Sexually Abused:     Past Medical History, Surgical history, Social history, and Family history were reviewed and updated as appropriate.   Please see review of systems for further details on the patient's review from today.   Review of Systems:  Review of Systems  Constitutional: Negative for chills, diaphoresis and fever.  HENT: Negative for trouble swallowing.   Respiratory: Positive for shortness of breath. Negative for cough, choking, chest tightness, wheezing and stridor.   Cardiovascular: Positive for chest pain. Negative for palpitations.    Objective:   Physical Exam:  BP (!) 149/62   Pulse 60   Temp 97.7 F (36.5 C) (Tympanic)   Ht 5' 6"  (1.676 m)   Wt 123 lb 9.6 oz (56.1 kg)   SpO2 100% Comment: RA  BMI 19.95 kg/m  ECOG: 1  Physical Exam Constitutional:      General: She is not in acute distress.    Appearance: She is not diaphoretic.  HENT:     Head: Normocephalic and atraumatic.  Cardiovascular:     Rate and Rhythm: Normal rate and regular rhythm.     Heart sounds: Normal heart sounds. No murmur heard.  No friction rub. No gallop.   Pulmonary:     Effort: Pulmonary effort is normal. No respiratory distress.     Breath sounds: No stridor. Examination of the left-middle field reveals decreased breath sounds. Examination of the left-lower field reveals decreased breath sounds. Decreased breath sounds present. No wheezing or rales.  Skin:    General: Skin is warm and dry.     Coloration: Skin is not pale.     Findings: No erythema.  Neurological:     Mental Status: She is alert.  Motor: No weakness.     Comments: Upper extremity strength 5/5 bilaterally  Lower extremity strength 5/5 bilaterally  Psychiatric:        Mood and Affect: Mood normal.        Behavior: Behavior normal.        Thought Content: Thought content normal.         Judgment: Judgment normal.     Lab Review:     Component Value Date/Time   NA 139 05/30/2020 1054   NA 138 09/26/2017 0942   K 4.9 05/30/2020 1054   K 5.2 No visable hemolysis (H) 09/26/2017 0942   CL 104 05/30/2020 1054   CL 107 04/05/2013 0754   CO2 28 05/30/2020 1054   CO2 26 09/26/2017 0942   GLUCOSE 84 05/30/2020 1054   GLUCOSE 93 09/26/2017 0942   GLUCOSE 101 (H) 04/05/2013 0754   BUN 23 05/30/2020 1054   BUN 16.0 09/26/2017 0942   CREATININE 1.16 (H) 05/30/2020 1054   CREATININE 1.2 (H) 09/26/2017 0942   CALCIUM 10.4 (H) 05/30/2020 1054   CALCIUM 9.9 09/26/2017 0942   PROT 6.5 05/30/2020 1054   PROT 6.6 09/26/2017 0942   ALBUMIN 3.2 (L) 05/30/2020 1054   ALBUMIN 3.6 09/26/2017 0942   AST 14 (L) 05/30/2020 1054   AST 18 09/26/2017 0942   ALT 9 05/30/2020 1054   ALT 13 09/26/2017 0942   ALKPHOS 98 05/30/2020 1054   ALKPHOS 68 09/26/2017 0942   BILITOT 0.2 (L) 05/30/2020 1054   BILITOT 0.31 09/26/2017 0942   GFRNONAA 44 (L) 05/30/2020 1054   GFRAA 51 (L) 05/30/2020 1054       Component Value Date/Time   WBC 6.5 05/30/2020 1054   WBC 13.4 (H) 02/17/2020 0334   RBC 3.47 (L) 05/30/2020 1054   HGB 10.6 (L) 05/30/2020 1054   HGB 10.5 (L) 09/26/2017 0942   HCT 33.6 (L) 05/30/2020 1054   HCT 32.5 (L) 09/26/2017 0942   PLT 288 05/30/2020 1054   PLT 240 09/26/2017 0942   MCV 96.8 05/30/2020 1054   MCV 90.1 09/26/2017 0942   MCH 30.5 05/30/2020 1054   MCHC 31.5 05/30/2020 1054   RDW 14.6 05/30/2020 1054   RDW 14.7 (H) 09/26/2017 0942   LYMPHSABS 1.1 05/30/2020 1054   LYMPHSABS 0.8 (L) 09/26/2017 0942   MONOABS 1.1 (H) 05/30/2020 1054   MONOABS 0.8 09/26/2017 0942   EOSABS 0.2 05/30/2020 1054   EOSABS 0.1 09/26/2017 0942   BASOSABS 0.1 05/30/2020 1054   BASOSABS 0.0 09/26/2017 0942   -------------------------------  Imaging from last 24 hours (if applicable):  Radiology interpretation: No results found.      This patient was seen with Dr.  Julien Nordmann with my treatment plan reviewed with him. He expressed agreement with my medical management of this patient.  ADDENDUM: Hematology/Oncology Attending: I had a face-to-face encounter with the patient.  I recommended her care plan.  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  She also has a history of colon adenocarcinoma as well as history of breast cancer.  The patient has been tolerating her treatment with Tagrisso fairly well.  She presented to the symptom management clinic for evaluation after she had a brief episode of shaking  and seizure-like activity but she never lost her consciousness.  She also has shortness of breath that has been going on for a while but not much worse than before. The patient is feeling much better today.  Her repeat blood work is unremarkable for any worsening of her condition. I recommended for her to continue with her current treatment with Tagrisso for now. We will arrange for the patient to have repeat CT scan of the chest, abdomen pelvis as well as MRI of the brain in less than 2 weeks. The patient was advised to call immediately if she has any other concerning symptoms in the interval.  Disclaimer: This note was dictated with voice recognition software. Similar sounding words can inadvertently be transcribed and may be missed upon review. Eilleen Kempf, MD 05/31/20

## 2020-05-30 NOTE — Patient Instructions (Signed)

## 2020-06-09 ENCOUNTER — Inpatient Hospital Stay: Payer: Medicare Other

## 2020-06-12 ENCOUNTER — Ambulatory Visit (HOSPITAL_COMMUNITY)
Admission: RE | Admit: 2020-06-12 | Discharge: 2020-06-12 | Disposition: A | Discharge status: Home / Self Care | Payer: Medicare Other | Source: Ambulatory Visit | Attending: Medical | Admitting: Medical

## 2020-06-12 ENCOUNTER — Inpatient Hospital Stay: Payer: Medicare Other

## 2020-06-12 ENCOUNTER — Ambulatory Visit (HOSPITAL_COMMUNITY)
Admission: RE | Admit: 2020-06-12 | Discharge: 2020-06-12 | Disposition: A | Discharge status: Home / Self Care | Payer: Medicare Other | Source: Ambulatory Visit | Attending: Internal Medicine | Admitting: Internal Medicine

## 2020-06-12 ENCOUNTER — Other Ambulatory Visit: Payer: Self-pay

## 2020-06-12 DIAGNOSIS — R569 Unspecified convulsions: Secondary | ICD-10-CM

## 2020-06-12 DIAGNOSIS — C3412 Malignant neoplasm of upper lobe, left bronchus or lung: Secondary | ICD-10-CM

## 2020-06-12 DIAGNOSIS — C3490 Malignant neoplasm of unspecified part of unspecified bronchus or lung: Secondary | ICD-10-CM

## 2020-06-12 LAB — CMP (CANCER CENTER ONLY)
ALT: 10 U/L (ref 0–44)
AST: 15 U/L (ref 15–41)
Albumin: 3.5 g/dL (ref 3.5–5.0)
Alkaline Phosphatase: 98 U/L (ref 38–126)
Anion gap: 9 (ref 5–15)
BUN: 18 mg/dL (ref 8–23)
CO2: 29 mmol/L (ref 22–32)
Calcium: 10.6 mg/dL — ABNORMAL HIGH (ref 8.9–10.3)
Chloride: 103 mmol/L (ref 98–111)
Creatinine: 1.28 mg/dL — ABNORMAL HIGH (ref 0.44–1.00)
GFR, Est AFR Am: 45 mL/min — ABNORMAL LOW (ref >60)
GFR, Estimated: 39 mL/min — ABNORMAL LOW (ref >60)
Glucose, Bld: 99 mg/dL (ref 70–99)
Potassium: 3.9 mmol/L (ref 3.5–5.1)
Sodium: 141 mmol/L (ref 135–145)
Total Bilirubin: 0.3 mg/dL (ref 0.3–1.2)
Total Protein: 6.9 g/dL (ref 6.5–8.1)

## 2020-06-12 LAB — CBC WITH DIFFERENTIAL (CANCER CENTER ONLY)
Abs Immature Granulocytes: 0.01 10*3/uL (ref 0.00–0.07)
Basophils Absolute: 0.1 10*3/uL (ref 0.0–0.1)
Basophils Relative: 1 %
Eosinophils Absolute: 0.1 10*3/uL (ref 0.0–0.5)
Eosinophils Relative: 2 %
HCT: 35.5 % — ABNORMAL LOW (ref 36.0–46.0)
Hemoglobin: 11.2 g/dL — ABNORMAL LOW (ref 12.0–15.0)
Immature Granulocytes: 0 %
Lymphocytes Relative: 14 %
Lymphs Abs: 1 10*3/uL (ref 0.7–4.0)
MCH: 30.4 pg (ref 26.0–34.0)
MCHC: 31.5 g/dL (ref 30.0–36.0)
MCV: 96.2 fL (ref 80.0–100.0)
Monocytes Absolute: 0.9 10*3/uL (ref 0.1–1.0)
Monocytes Relative: 13 %
Neutro Abs: 4.8 10*3/uL (ref 1.7–7.7)
Neutrophils Relative %: 70 %
Platelet Count: 308 10*3/uL (ref 150–400)
RBC: 3.69 MIL/uL — ABNORMAL LOW (ref 3.87–5.11)
RDW: 14.4 % (ref 11.5–15.5)
WBC Count: 6.9 10*3/uL (ref 4.0–10.5)
nRBC: 0 % (ref 0.0–0.2)

## 2020-06-12 MED ORDER — IOHEXOL 300 MG/ML  SOLN
100.0000 mL | Freq: Once | INTRAMUSCULAR | Status: AC | PRN
  Administered 2020-06-12: 75 mL via INTRAVENOUS

## 2020-06-12 MED ORDER — SODIUM CHLORIDE (PF) 0.9 % IJ SOLN
INTRAMUSCULAR | Status: AC
  Filled 2020-06-12: qty 50

## 2020-06-12 MED ORDER — GADOBUTROL 1 MMOL/ML IV SOLN
5.0000 mL | Freq: Once | INTRAVENOUS | Status: AC | PRN
  Administered 2020-06-12: 5 mL via INTRAVENOUS

## 2020-06-13 ENCOUNTER — Other Ambulatory Visit: Payer: Medicare Other

## 2020-06-13 ENCOUNTER — Telehealth: Payer: Self-pay | Admitting: Internal Medicine

## 2020-06-13 ENCOUNTER — Other Ambulatory Visit: Payer: Self-pay

## 2020-06-13 ENCOUNTER — Encounter: Payer: Self-pay | Admitting: Internal Medicine

## 2020-06-13 ENCOUNTER — Inpatient Hospital Stay (HOSPITAL_BASED_OUTPATIENT_CLINIC_OR_DEPARTMENT_OTHER): Payer: Medicare Other | Admitting: Internal Medicine

## 2020-06-13 VITALS — BP 166/72 | HR 67 | Temp 97.8°F | Resp 18 | Ht 66.0 in | Wt 122.0 lb

## 2020-06-13 DIAGNOSIS — C3412 Malignant neoplasm of upper lobe, left bronchus or lung: Secondary | ICD-10-CM

## 2020-06-13 NOTE — Telephone Encounter (Signed)
Scheduled per los. Gave avs and calendar  

## 2020-06-13 NOTE — Progress Notes (Signed)
[place] [phone]   [phone]  OFFICE PROGRESS NOTE  Kristin Hong, MD [address] [place] [address]  DIAGNOSIS:  1) Metastatic non-small cell lung cancer, adenocarcinoma diagnosed in March 2012 with positive EGFR mutation in exon 21 ([id number]). 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 2) adenocarcinoma of the ascending colon (T2, N0, M0) with MSI high diagnosed in March 2017. 3) history of breast adenocarcinoma.  PRIOR THERAPY:  1) Tarceva 150 mg by mouth daily status post 68 months of treatment. 2) Status post right colectomy with lymph node dissection in May 2017.  CURRENT THERAPY: 1) Tagrisso 80 mg by mouth daily started 11/09/2016.  Status post 44 months of treatment. 2) observation for the history of colon adenocarcinoma. 3) Femara 2.5 mg by mouth daily for history of breast cancer.  INTERVAL HISTORY: Kristin [name] 79 y.o. female returns to the clinic today for follow-up visit accompanied by her daughter Kristin [name].  The patient is feeling fine today with no concerning complaints except for the persistent left-sided chest pain and she takes half a tablet of oxycodone on as-needed basis.  The patient denied having any shortness of breath but has mild cough with no hemoptysis.  She denied having any fever or chills.  She has no nausea, vomiting, diarrhea or constipation.  She has no headache or visual changes.  She continues to tolerate her treatment with Tagrisso and femoral fairly well.  She had repeat CT scan of the chest, abdomen pelvis as well as MRI of the brain performed recently and she is here for evaluation and discussion of her scan results and treatment options.  MEDICAL HISTORY: Past Medical History:  Diagnosis Date  . Allergy   . Anemia   . Anemia of chronic disease 09/21/2016  . Anxiety   . Arthritis    arthritis- left hip  . Blood transfusion without reported diagnosis     transfusion- 3-4 yrs ago -found to be anemic on routine lab check  . Breast cancer ([place]) 2002   bilateral- bilateral mastectomies done.  . 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 CVA (cerebral infarction) 04/30/2013  . Encounter for antineoplastic chemotherapy 10/28/2016  . Family history of brain cancer   . Family history of breast cancer   . Family history of colon cancer 10/06/2009  . Family history of colon cancer   . Family history of prostate cancer   . GERD (gastroesophageal reflux disease)   . Glaucoma   . History of bilateral mastectomy 05/20/2010  . Hypertension   . Lung cancer ([place]) dx'd 12/2010   last Ct scan "no lung cancer" showing 11'16 CT Chest Epic.  . Malignant neoplasm of ascending colon ([place]) 01/29/2016  . Stroke [facility])    2 yrs ago-no residual  . Tubular adenoma of colon 07/2011   colon polyps ans reoccurence with malignancy found    ALLERGIES:  is allergic to doxycycline and sulfa antibiotics.  MEDICATIONS:  Current Outpatient Medications  Medication Sig Dispense Refill  . amiodarone (PACERONE) 200 MG tablet Take 1 tablet (200 mg total) by mouth daily. 90 tablet 2  . amLODipine (NORVASC) 5 MG tablet Take by mouth.    . brimonidine-timolol (COMBIGAN) 0.2-0.5 % ophthalmic solution Place 1 drop into the right eye every 12 (twelve) hours.    . clopidogrel (PLAVIX) 75 MG tablet Take 75 mg by mouth daily.    . cycloSPORINE (RESTASIS) 0.05 % ophthalmic emulsion Place 1 drop into both eyes  2 (two) times daily.    Marland Kitchen escitalopram (LEXAPRO) 10 MG tablet Take 10 mg by mouth daily.     . famotidine (PEPCID) 40 MG tablet Take 40 mg by mouth daily.    . feeding supplement, ENSURE ENLIVE, (ENSURE ENLIVE) LIQD Take 237 mLs by mouth 2 (two) times daily between meals. 237 mL 12  . FeFum-FePoly-FA-B Cmp-C-Biot (FOLIVANE-PLUS) CAPS TAKE 1 CAPSULE BY MOUTH EVERY DAY IN THE MORNING (Patient taking differently: Take 1 capsule by mouth daily. ) 90 capsule 2  . furosemide (LASIX) 20 MG tablet  Take 20 mg by mouth.    . letrozole (FEMARA) 2.5 MG tablet TAKE 1 TABLET BY MOUTH EVERY DAY (Patient taking differently: Take 2.5 mg by mouth daily. ) 90 tablet 1  . mirtazapine (REMERON) 7.5 MG tablet Take 7.5 mg by mouth at bedtime. (Patient not taking: Reported on 04/06/2020)    . Multiple Vitamins-Iron (MULTIVITAMINS WITH IRON) TABS tablet Take 1 tablet by mouth daily.  0  . Multiple Vitamins-Minerals (PRESERVISION AREDS 2) CAPS Take by mouth.    Marland Kitchen osimertinib mesylate (TAGRISSO) 80 MG tablet Take 1 tablet (80 mg total) by mouth daily. 30 tablet 3  . oxyCODONE-acetaminophen (PERCOCET/ROXICET) 5-325 MG tablet Take 1 tablet by mouth every 8 (eight) hours as needed for severe pain. 30 tablet 0  . prochlorperazine (COMPAZINE) 10 MG tablet Take 1 tablet (10 mg total) by mouth every 6 (six) hours as needed for nausea or vomiting. 30 tablet 0  . rivaroxaban (XARELTO) 20 MG TABS tablet Take 20 mg by mouth daily with supper.     Current Facility-Administered Medications  Medication Dose Route Frequency Provider Last Rate Last Admin  . 0.9 %  sodium chloride infusion  500 mL Intravenous Continuous Armbruster, Carlota Raspberry, MD        SURGICAL HISTORY:  Past Surgical History:  Procedure Laterality Date  . APPENDECTOMY    . BOWEL DECOMPRESSION N/A 06/12/2018   Procedure: BOWEL DECOMPRESSION;  Surgeon: Rush Landmark Telford Nab., MD;  Location: [facility];  Service: Gastroenterology;  Laterality: N/A;  . cataracts Bilateral   . COLECTOMY WITH COLOSTOMY CREATION/HARTMANN PROCEDURE  06/16/2018   Procedure: CREATION/HARTMANN COLOSTOMY;  Surgeon: Michael Boston, MD;  Location: [facility];  Service: General;;  . COLONOSCOPY    . COLOSTOMY N/A 06/16/2018   Procedure: POSSIBLE COLOSTOMY;  Surgeon: Michael Boston, MD;  Location: [facility];  Service: General;  Laterality: N/A;  . EYE SURGERY    . FLEXIBLE SIGMOIDOSCOPY N/A 06/12/2018   Procedure: FLEXIBLE SIGMOIDOSCOPY;  Surgeon: Rush Landmark Telford Nab., MD;  Location: [facility]OS[facility];  Service: Gastroenterology;  Laterality: N/A;  . LAPAROSCOPIC SIGMOID COLECTOMY N/A 06/16/2018   Procedure: LAPAROSCOPIC LOW ANTERIOR RESECTION;  Surgeon: Michael Boston, MD;  Location: [facility];  Service: General;  Laterality: N/A;  . LUNG SURGERY     Resection -" not done. Pt . tx with Tarceva  . MASTECTOMY Bilateral   . MEDIASTINOSCOPY N/A 10/02/2016   Procedure: MEDIASTINOSCOPY;  Surgeon: [name], MD;  Location: [facility] OR;  Service: Thoracic;  Laterality: N/A;  . RECONSTRUCTION BREAST W/ TRAM FLAP Bilateral    bilaterally  . robotic right hemicolectomy Right 03/06/2016   Dr. Johney Maine  . TEE WITHOUT CARDIOVERSION N/A 06/04/2013   Procedure: TRANSESOPHAGEAL ECHOCARDIOGRAM (TEE);  Surgeon: Jolaine Artist, MD;  Location: [facility];  Service: Cardiovascular;  Laterality: N/A;  . TONSILLECTOMY    . TOTAL HIP ARTHROPLASTY Left 08/07/2016   Procedure: LEFT TOTAL HIP ARTHROPLASTY ANTERIOR APPROACH;  Surgeon:  Gaynelle Arabian, MD;  Location: [facility];  Service: Orthopedics;  Laterality: Left;    REVIEW OF SYSTEMS:  Constitutional: positive for fatigue Eyes: negative Ears, nose, mouth, throat, and face: negative Respiratory: positive for pleurisy/chest pain Cardiovascular: negative Gastrointestinal: negative Genitourinary:negative Integument/breast: negative Hematologic/lymphatic: negative Musculoskeletal:negative Neurological: negative Behavioral/Psych: negative Endocrine: negative Allergic/Immunologic: negative   PHYSICAL EXAMINATION: General appearance: alert, cooperative, fatigued and no distress Head: Normocephalic, without obvious abnormality, atraumatic Neck: no adenopathy, no JVD, supple, symmetrical, trachea midline and thyroid not enlarged, symmetric, no tenderness/mass/nodules Lymph nodes: Cervical, supraclavicular, and axillary nodes normal. Resp: clear to auscultation bilaterally Back: symmetric, no curvature. ROM normal. No CVA tenderness. Cardio: regular  rate and rhythm, S1, S2 normal, no murmur, click, rub or gallop GI: soft, non-tender; bowel sounds normal; no masses,  no organomegaly Extremities: extremities normal, atraumatic, no cyanosis or edema Neurologic: Alert and oriented X 3, normal strength and tone. Normal symmetric reflexes. Normal coordination and gait  ECOG PERFORMANCE STATUS: 1 - Symptomatic but completely ambulatory  Blood pressure (!) 166/72, pulse 67, temperature 97.8 F (36.6 C), temperature source Tympanic, resp. rate 18, height 5' 6"  (1.676 m), weight 122 lb (55.3 kg), SpO2 99 %.  LABORATORY DATA: Lab Results  Component Value Date   WBC 6.9 06/12/2020   HGB 11.2 (L) 06/12/2020   HCT 35.5 (L) 06/12/2020   MCV 96.2 06/12/2020   PLT 308 06/12/2020      Chemistry      Component Value Date/Time   NA 141 06/12/2020 1236   NA 138 09/26/2017 0942   K 3.9 06/12/2020 1236   K 5.2 No visable hemolysis (H) 09/26/2017 0942   CL 103 06/12/2020 1236   CL 107 04/05/2013 0754   CO2 29 06/12/2020 1236   CO2 26 09/26/2017 0942   BUN 18 06/12/2020 1236   BUN 16.0 09/26/2017 0942   CREATININE 1.28 (H) 06/12/2020 1236   CREATININE 1.2 (H) 09/26/2017 0942      Component Value Date/Time   CALCIUM 10.6 (H) 06/12/2020 1236   CALCIUM 9.9 09/26/2017 0942   ALKPHOS 98 06/12/2020 1236   ALKPHOS 68 09/26/2017 0942   AST 15 06/12/2020 1236   AST 18 09/26/2017 0942   ALT 10 06/12/2020 1236   ALT 13 09/26/2017 0942   BILITOT 0.3 06/12/2020 1236   BILITOT 0.31 09/26/2017 0942       RADIOGRAPHIC STUDIES: DG Chest 1 View  Result Date: 06/12/2020 CLINICAL DATA:  Increased shortness of breath EXAM: CHEST  1 VIEW COMPARISON:  04/04/2020, CT 02/09/2020, 06/12/2020 FINDINGS: Right lung is grossly clear. Small moderate left pleural effusion and or pleural disease without significant change. Persistent basilar consolidation. Stable cardiomediastinal silhouette. Fluid level over the lower chest, corresponding to hiatal hernia.  IMPRESSION: No significant change in small moderate left pleural effusion/pleural disease with left basilar consolidation. Electronically Signed   By: Donavan Foil M.D.   On: 06/12/2020 21:43   CT Chest W Contrast  Result Date: 06/12/2020 CLINICAL DATA:  Lung cancer.  History of colon cancer. EXAM: CT CHEST, ABDOMEN, AND PELVIS WITH CONTRAST TECHNIQUE: Multidetector CT imaging of the chest, abdomen and pelvis was performed following the standard protocol during bolus administration of intravenous contrast. CONTRAST:  26m OMNIPAQUE IOHEXOL 300 MG/ML  SOLN COMPARISON:  PET 03/03/2020 CT chest abdomen pelvis 02/09/2020. FINDINGS: CT CHEST FINDINGS Cardiovascular: Atherosclerotic calcification of the aorta, aortic valve and coronary arteries. Heart is enlarged. No pericardial effusion. Mediastinum/Nodes: Multiple low-attenuation lesions in the thyroid measure up to approximately 1.9  cm on the left. Thyroid ultrasound was performed 03/10/2020 with biopsy 03/30/2020 and any further imaging follow-up is at the discretion of the referring provider (Ref: J Am Coll Radiol. 2015 Feb;12(2): 143-50).No pathologically enlarged mediastinal, hilar or axillary lymph nodes. Surgical clips in the right axilla. Esophagus is unremarkable. Large hiatal hernia. Lungs/Pleura: Patchy ground-glass in the right upper lobe, similar. Ground-glass and consolidation in the lingula and left lower lobe the left lower lobe appear similar to 03/03/2020. Right pleural effusion has nearly completely resolved in the interval. Small residual left pleural effusion with extensive pleural thickening. Scarring in the left upper lobe. Airway is unremarkable. Musculoskeletal: Degenerative changes in the spine. No worrisome lytic or sclerotic lesions. CT ABDOMEN PELVIS FINDINGS Hepatobiliary: 2.3 cm low-attenuation lesion in the dome of the right hepatic lobe is likely a cyst. Subcentimeter low-attenuation lesions in the inferior right hepatic lobe are  too small to characterize. Liver and gallbladder are otherwise unremarkable. No biliary ductal dilatation. Pancreas: Fat invagination is seen within the tail the pancreas. Otherwise unremarkable. Spleen: Negative. Adrenals/Urinary Tract: Right adrenal gland is unremarkable. Slight thickening of the left adrenal gland, similar. Subcentimeter low-attenuation lesions in the kidneys are too small to characterize. Ureters are decompressed. Bladder is volume. Stomach/Bowel: Large hiatal hernia. Stomach and small bowel are unremarkable. Appendix is not readily visualized. Left lower quadrant colostomy. Colon is otherwise unremarkable. Vascular/Lymphatic: Atherosclerotic calcification of the aorta without aneurysm. No pathologically enlarged lymph nodes. Reproductive: Calcifications in the uterus are indicative of fibroids. No adnexal mass. Other: No free fluid.  Mesenteries and peritoneum are unremarkable. Musculoskeletal: Left hip arthroplasty. Scattered bone islands. Degenerative changes in the spine. No worrisome lytic or sclerotic lesions. IMPRESSION: 1. Small amount of residual left pleural fluid with extensive pleural thickening, shown to be hypermetabolic on PET 94/70/9628 and indicative of metastatic disease. Associated airspace consolidation in the left lower lobe is unchanged. 2. Near complete resolution of previously seen right pleural effusion with residual volume loss in dependent right lower lobe. 3. Persistent patchy ground-glass in the right upper lobe is likely infectious or inflammatory in etiology. 4. Large hiatal hernia. 5. Aortic atherosclerosis (ICD10-I70.0). Coronary artery calcification. Electronically Signed   By: Lorin Picket M.D.   On: 06/12/2020 15:13   MR BRAIN W WO CONTRAST  Result Date: 06/12/2020 CLINICAL DATA:  Metastatic non-small cell lung cancer. Recent seizure. EXAM: MRI HEAD WITHOUT AND WITH CONTRAST TECHNIQUE: Multiplanar, multiecho pulse sequences of the brain and surrounding  structures were obtained without and with intravenous contrast. CONTRAST:  67m GADAVIST GADOBUTROL 1 MMOL/ML IV SOLN COMPARISON:  02/15/2020 FINDINGS: Brain: There is no evidence of an acute infarct, intracranial hemorrhage, mass, midline shift, or extra-axial fluid collection. The ventricles and sulci are normal. Scattered small foci of T2 hyperintensity in the cerebral white matter bilaterally are unchanged and nonspecific but compatible with minimal chronic small vessel ischemic disease, not considered abnormal for age. No abnormal brain parenchymal or meningeal enhancement is seen to indicate metastatic disease. 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 Vascular: Major intracranial vascular flow voids are preserved. Skull and upper cervical spine: Unchanged longstanding left parietal skull lesion consistent with a hemangioma or other benign entity. Sinuses/Orbits: Bilateral cataract extraction. Paranasal sinuses and mastoid air cells are clear. Other: None. IMPRESSION: No evidence of intracranial metastases or acute abnormality. Electronically Signed   By: ALogan BoresM.D.   On: 06/12/2020 17:38   CT Abdomen Pelvis W Contrast  Result Date: 06/12/2020 CLINICAL DATA:  Lung cancer.  History of colon cancer. EXAM: CT CHEST, ABDOMEN, AND PELVIS WITH CONTRAST TECHNIQUE: Multidetector CT imaging of the chest, abdomen and pelvis was performed following the standard protocol during bolus administration of intravenous contrast. CONTRAST:  69m OMNIPAQUE IOHEXOL 300 MG/ML  SOLN COMPARISON:  PET 03/03/2020 CT chest abdomen pelvis 02/09/2020. FINDINGS: CT CHEST FINDINGS Cardiovascular: Atherosclerotic calcification of the aorta, aortic valve and coronary arteries. Heart is enlarged. No pericardial effusion. Mediastinum/Nodes: Multiple low-attenuation lesions in the thyroid measure up to approximately 1.9 cm on the left. Thyroid ultrasound was performed 03/10/2020 with biopsy  03/30/2020 and any further imaging follow-up is at the discretion of the referring provider (Ref: J Am Coll Radiol. 2015 Feb;12(2): 143-50).No pathologically enlarged mediastinal, hilar or axillary lymph nodes. Surgical clips in the right axilla. Esophagus is unremarkable. Large hiatal hernia. Lungs/Pleura: Patchy ground-glass in the right upper lobe, similar. Ground-glass and consolidation in the lingula and left lower lobe the left lower lobe appear similar to 03/03/2020. Right pleural effusion has nearly completely resolved in the interval. Small residual left pleural effusion with extensive pleural thickening. Scarring in the left upper lobe. Airway is unremarkable. Musculoskeletal: Degenerative changes in the spine. No worrisome lytic or sclerotic lesions. CT ABDOMEN PELVIS FINDINGS Hepatobiliary: 2.3 cm low-attenuation lesion in the dome of the right hepatic lobe is likely a cyst. Subcentimeter low-attenuation lesions in the inferior right hepatic lobe are too small to characterize. Liver and gallbladder are otherwise unremarkable. No biliary ductal dilatation. Pancreas: Fat invagination is seen within the tail the pancreas. Otherwise unremarkable. Spleen: Negative. Adrenals/Urinary Tract: Right adrenal gland is unremarkable. Slight thickening of the left adrenal gland, similar. Subcentimeter low-attenuation lesions in the kidneys are too small to characterize. Ureters are decompressed. Bladder is volume. Stomach/Bowel: Large hiatal hernia. Stomach and small bowel are unremarkable. Appendix is not readily visualized. Left lower quadrant colostomy. Colon is otherwise unremarkable. Vascular/Lymphatic: Atherosclerotic calcification of the aorta without aneurysm. No pathologically enlarged lymph nodes. Reproductive: Calcifications in the uterus are indicative of fibroids. No adnexal mass. Other: No free fluid.  Mesenteries and peritoneum are unremarkable. Musculoskeletal: Left hip arthroplasty. Scattered bone  islands. Degenerative changes in the spine. No worrisome lytic or sclerotic lesions. IMPRESSION: 1. Small amount of residual left pleural fluid with extensive pleural thickening, shown to be hypermetabolic on PET 053/97/6734and indicative of metastatic disease. Associated airspace consolidation in the left lower lobe is unchanged. 2. Near complete resolution of previously seen right pleural effusion with residual volume loss in dependent right lower lobe. 3. Persistent patchy ground-glass in the right upper lobe is likely infectious or inflammatory in etiology. 4. Large hiatal hernia. 5. Aortic atherosclerosis (ICD10-I70.0). Coronary artery calcification. Electronically Signed   By: MLorin PicketM.D.   On: 06/12/2020 15:13    ASSESSMENT AND PLAN:  This is a very pleasant 79years old white female with metastatic non-small cell lung cancer, adenocarcinoma diagnosed in March 2012 with positive EGFR mutation status post 68 months of treatment with Tarceva discontinued secondary to disease progression and development of EGFR T790M resistant mutation. The patient is currently on treatment with Tagrisso 80 mg by mouth daily status post 44 months.   The patient continues to tolerate her treatment with Tagrisso fairly well. She had repeat CT scan of the chest, abdomen pelvis as well as MRI of the brain performed yesterday. I personally and independently reviewed the scan images and discussed the results with the patient and her daughter. Her scan showed no concerning findings for disease progression  except for a small amount of residual left pleural effusion with extensive pleural thickening still suspicious for metastatic disease.  The MRI of the brain showed no concerning findings for metastatic disease to the brain. I recommended for the patient to continue her current treatment with Tagrisso with the same dose.  If the left-sided chest pain is getting worse, I will refer the patient to radiation oncology  for consideration of palliative radiotherapy. For the history of colon cancer she is currently on observation. For the history of breast cancer she will continue her current treatment with Femara. For the anemia, she will continue with the oral iron tablets for now. The patient will come back for follow-up visit in 6 weeks for evaluation with repeat blood work. She was advised to call immediately if she has any concerning symptoms in the interval. The patient voices understanding of current disease status and treatment options and is in agreement with the current care plan. All questions were answered. The patient knows to call the clinic with any problems, questions or concerns. We can certainly see the patient much sooner if necessary.  Disclaimer: This note was dictated with voice recognition software. Similar sounding words can inadvertently be transcribed and may not be corrected upon review.

## 2020-06-28 ENCOUNTER — Other Ambulatory Visit: Payer: Self-pay | Admitting: Internal Medicine

## 2020-06-28 DIAGNOSIS — C50112 Malignant neoplasm of central portion of left female breast: Secondary | ICD-10-CM

## 2020-07-03 ENCOUNTER — Other Ambulatory Visit: Payer: Self-pay | Admitting: *Deleted

## 2020-07-03 MED ORDER — PROCHLORPERAZINE MALEATE 10 MG PO TABS: 10.0000 mg | ORAL_TABLET | Freq: Four times a day (QID) | ORAL | 0 refills | Status: DC | PRN

## 2020-07-25 ENCOUNTER — Encounter: Payer: Self-pay | Admitting: Internal Medicine

## 2020-07-25 ENCOUNTER — Inpatient Hospital Stay: Payer: Medicare Other

## 2020-07-25 ENCOUNTER — Inpatient Hospital Stay: Payer: Medicare Other | Attending: Internal Medicine | Admitting: Internal Medicine

## 2020-07-25 ENCOUNTER — Telehealth: Payer: Self-pay | Admitting: Internal Medicine

## 2020-07-25 ENCOUNTER — Other Ambulatory Visit: Payer: Self-pay

## 2020-07-25 VITALS — BP 135/66 | HR 69 | Temp 97.9°F | Resp 18 | Ht 66.0 in | Wt 118.6 lb

## 2020-07-25 DIAGNOSIS — Z85038 Personal history of other malignant neoplasm of large intestine: Secondary | ICD-10-CM | POA: Insufficient documentation

## 2020-07-25 DIAGNOSIS — Z9049 Acquired absence of other specified parts of digestive tract: Secondary | ICD-10-CM | POA: Diagnosis not present

## 2020-07-25 DIAGNOSIS — C50111 Malignant neoplasm of central portion of right female breast: Secondary | ICD-10-CM

## 2020-07-25 DIAGNOSIS — Z9013 Acquired absence of bilateral breasts and nipples: Secondary | ICD-10-CM | POA: Insufficient documentation

## 2020-07-25 DIAGNOSIS — Z7901 Long term (current) use of anticoagulants: Secondary | ICD-10-CM | POA: Insufficient documentation

## 2020-07-25 DIAGNOSIS — D638 Anemia in other chronic diseases classified elsewhere: Secondary | ICD-10-CM | POA: Diagnosis not present

## 2020-07-25 DIAGNOSIS — Z79811 Long term (current) use of aromatase inhibitors: Secondary | ICD-10-CM | POA: Insufficient documentation

## 2020-07-25 DIAGNOSIS — J90 Pleural effusion, not elsewhere classified: Secondary | ICD-10-CM

## 2020-07-25 DIAGNOSIS — D539 Nutritional anemia, unspecified: Secondary | ICD-10-CM

## 2020-07-25 DIAGNOSIS — Z8673 Personal history of transient ischemic attack (TIA), and cerebral infarction without residual deficits: Secondary | ICD-10-CM | POA: Insufficient documentation

## 2020-07-25 DIAGNOSIS — Z170 Estrogen receptor positive status [ER+]: Secondary | ICD-10-CM | POA: Insufficient documentation

## 2020-07-25 DIAGNOSIS — Z5111 Encounter for antineoplastic chemotherapy: Secondary | ICD-10-CM

## 2020-07-25 DIAGNOSIS — F419 Anxiety disorder, unspecified: Secondary | ICD-10-CM | POA: Insufficient documentation

## 2020-07-25 DIAGNOSIS — I10 Essential (primary) hypertension: Secondary | ICD-10-CM | POA: Insufficient documentation

## 2020-07-25 DIAGNOSIS — Z8249 Family history of ischemic heart disease and other diseases of the circulatory system: Secondary | ICD-10-CM | POA: Insufficient documentation

## 2020-07-25 DIAGNOSIS — C3412 Malignant neoplasm of upper lobe, left bronchus or lung: Secondary | ICD-10-CM

## 2020-07-25 DIAGNOSIS — C3490 Malignant neoplasm of unspecified part of unspecified bronchus or lung: Secondary | ICD-10-CM | POA: Diagnosis not present

## 2020-07-25 DIAGNOSIS — C182 Malignant neoplasm of ascending colon: Secondary | ICD-10-CM

## 2020-07-25 DIAGNOSIS — Z800 Family history of malignant neoplasm of digestive organs: Secondary | ICD-10-CM | POA: Diagnosis not present

## 2020-07-25 DIAGNOSIS — C50919 Malignant neoplasm of unspecified site of unspecified female breast: Secondary | ICD-10-CM | POA: Insufficient documentation

## 2020-07-25 DIAGNOSIS — Z79899 Other long term (current) drug therapy: Secondary | ICD-10-CM | POA: Insufficient documentation

## 2020-07-25 DIAGNOSIS — Z808 Family history of malignant neoplasm of other organs or systems: Secondary | ICD-10-CM | POA: Insufficient documentation

## 2020-07-25 DIAGNOSIS — C50112 Malignant neoplasm of central portion of left female breast: Secondary | ICD-10-CM

## 2020-07-25 LAB — CBC WITH DIFFERENTIAL (CANCER CENTER ONLY)
Abs Immature Granulocytes: 0.01 10*3/uL (ref 0.00–0.07)
Basophils Absolute: 0 10*3/uL (ref 0.0–0.1)
Basophils Relative: 1 %
Eosinophils Absolute: 0.1 10*3/uL (ref 0.0–0.5)
Eosinophils Relative: 1 %
HCT: 32.7 % — ABNORMAL LOW (ref 36.0–46.0)
Hemoglobin: 10.4 g/dL — ABNORMAL LOW (ref 12.0–15.0)
Immature Granulocytes: 0 %
Lymphocytes Relative: 15 %
Lymphs Abs: 0.8 10*3/uL (ref 0.7–4.0)
MCH: 30.3 pg (ref 26.0–34.0)
MCHC: 31.8 g/dL (ref 30.0–36.0)
MCV: 95.3 fL (ref 80.0–100.0)
Monocytes Absolute: 0.9 10*3/uL (ref 0.1–1.0)
Monocytes Relative: 17 %
Neutro Abs: 3.5 10*3/uL (ref 1.7–7.7)
Neutrophils Relative %: 66 %
Platelet Count: 268 10*3/uL (ref 150–400)
RBC: 3.43 MIL/uL — ABNORMAL LOW (ref 3.87–5.11)
RDW: 14.7 % (ref 11.5–15.5)
WBC Count: 5.2 10*3/uL (ref 4.0–10.5)
nRBC: 0 % (ref 0.0–0.2)

## 2020-07-25 LAB — CMP (CANCER CENTER ONLY)
ALT: 11 U/L (ref 0–44)
AST: 15 U/L (ref 15–41)
Albumin: 3.3 g/dL — ABNORMAL LOW (ref 3.5–5.0)
Alkaline Phosphatase: 100 U/L (ref 38–126)
Anion gap: 4 — ABNORMAL LOW (ref 5–15)
BUN: 21 mg/dL (ref 8–23)
CO2: 31 mmol/L (ref 22–32)
Calcium: 10.1 mg/dL (ref 8.9–10.3)
Chloride: 105 mmol/L (ref 98–111)
Creatinine: 1.29 mg/dL — ABNORMAL HIGH (ref 0.44–1.00)
GFR, Estimated: 39 mL/min — ABNORMAL LOW (ref >60)
Glucose, Bld: 102 mg/dL — ABNORMAL HIGH (ref 70–99)
Potassium: 4.2 mmol/L (ref 3.5–5.1)
Sodium: 140 mmol/L (ref 135–145)
Total Bilirubin: 0.4 mg/dL (ref 0.3–1.2)
Total Protein: 6.4 g/dL — ABNORMAL LOW (ref 6.5–8.1)

## 2020-07-25 MED ORDER — TRAMADOL HCL 50 MG PO TABS: 50.0000 mg | ORAL_TABLET | Freq: Two times a day (BID) | ORAL | 0 refills | Status: DC | PRN

## 2020-07-25 NOTE — Progress Notes (Signed)
[facility] [phone]   [phone]  OFFICE PROGRESS NOTE  Eber Hong, MD [address] [place] [address]  DIAGNOSIS:  1) Metastatic non-small cell lung cancer, adenocarcinoma diagnosed in March 2012 with positive EGFR mutation in exon 21 (L858R). 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 2) adenocarcinoma of the ascending colon (T2, N0, M0) with MSI high diagnosed in March 2017. 3) history of breast adenocarcinoma.  PRIOR THERAPY:  1) Tarceva 150 mg by mouth daily status post 68 months of treatment. 2) Status post right colectomy with lymph node dissection in May 2017.  CURRENT THERAPY: 1) Tagrisso 80 mg by mouth daily started 11/09/2016.  Status post 45 months of treatment. 2) observation for the history of colon adenocarcinoma. 3) Femara 2.5 mg by mouth daily for history of breast cancer.  INTERVAL HISTORY: Kristin [name] 79 y.o. female returns to the clinic today for follow-up visit accompanied by her husband.  The patient is feeling fine today with no concerning complaints except for the intermittent left-sided chest pain and she takes Tylenol and occasional oxycodone but it makes her sick.  She denied having any shortness of breath, cough or hemoptysis.  She denied having any nausea, vomiting, diarrhea or constipation.  She has no headache or visual changes.  She denied having any significant weight loss or night sweats.  She continues to tolerate her treatment with Tagrisso fairly well.  The patient is here today for evaluation with repeat blood work.  MEDICAL HISTORY: Past Medical History:  Diagnosis Date  . Allergy   . Anemia   . Anemia of chronic disease 09/21/2016  . Anxiety   . Arthritis    arthritis- left hip  . Blood transfusion without reported diagnosis    transfusion- 3-4 yrs ago -found to be anemic on routine lab check  . Breast cancer ([place]) 2002   bilateral- bilateral mastectomies  done.  . 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 CVA (cerebral infarction) 04/30/2013  . Encounter for antineoplastic chemotherapy 10/28/2016  . Family history of brain cancer   . Family history of breast cancer   . Family history of colon cancer 10/06/2009  . Family history of colon cancer   . Family history of prostate cancer   . GERD (gastroesophageal reflux disease)   . Glaucoma   . History of bilateral mastectomy 05/20/2010  . Hypertension   . Lung cancer ([place]) dx'd 12/2010   last Ct scan "no lung cancer" showing 11'16 CT Chest Epic.  . Malignant neoplasm of ascending colon ([place]) 01/29/2016  . Stroke [facility])    2 yrs ago-no residual  . Tubular adenoma of colon 07/2011   colon polyps ans reoccurence with malignancy found    ALLERGIES:  is allergic to doxycycline and sulfa antibiotics.  MEDICATIONS:  Current Outpatient Medications  Medication Sig Dispense Refill  . amLODipine (NORVASC) 5 MG tablet Take by mouth.    . brimonidine-timolol (COMBIGAN) 0.2-0.5 % ophthalmic solution Place 1 drop into the right eye every 12 (twelve) hours.    . Cholecalciferol 50 MCG (2000 UT) CAPS Take 1 tablet by mouth daily.    . clopidogrel (PLAVIX) 75 MG tablet Take 75 mg by mouth daily.    . cycloSPORINE (RESTASIS) 0.05 % ophthalmic emulsion Place 1 drop into both eyes 2 (two) times daily.    Marland Kitchen escitalopram (LEXAPRO) 10 MG tablet Take 10 mg by mouth daily.     . famotidine (PEPCID) 40 MG tablet  Take 40 mg by mouth daily.    . feeding supplement, ENSURE ENLIVE, (ENSURE ENLIVE) LIQD Take 237 mLs by mouth 2 (two) times daily between meals. 237 mL 12  . FeFum-FePoly-FA-B Cmp-C-Biot (FOLIVANE-PLUS) CAPS TAKE 1 CAPSULE BY MOUTH EVERY DAY IN THE MORNING (Patient taking differently: Take 1 capsule by mouth daily. ) 90 capsule 2  . furosemide (LASIX) 20 MG tablet Take 20 mg by mouth.    . letrozole (FEMARA) 2.5 MG tablet TAKE 1 TABLET BY MOUTH EVERY DAY 90 tablet 1  . mirtazapine (REMERON) 7.5 MG tablet Take 7.5 mg  by mouth at bedtime.     . Multiple Vitamins-Iron (MULTIVITAMINS WITH IRON) TABS tablet Take 1 tablet by mouth daily.  0  . Multiple Vitamins-Minerals (PRESERVISION AREDS 2) CAPS Take by mouth.    Marland Kitchen osimertinib mesylate (TAGRISSO) 80 MG tablet Take 1 tablet (80 mg total) by mouth daily. 30 tablet 3  . oxyCODONE-acetaminophen (PERCOCET/ROXICET) 5-325 MG tablet Take 1 tablet by mouth every 8 (eight) hours as needed for severe pain. 30 tablet 0  . prochlorperazine (COMPAZINE) 10 MG tablet Take 1 tablet (10 mg total) by mouth every 6 (six) hours as needed for nausea or vomiting. 30 tablet 0  . rivaroxaban (XARELTO) 20 MG TABS tablet Take 20 mg by mouth daily with supper.    Marland Kitchen amiodarone (PACERONE) 200 MG tablet Take 1 tablet (200 mg total) by mouth daily. 90 tablet 2   Current Facility-Administered Medications  Medication Dose Route Frequency Provider Last Rate Last Admin  . 0.9 %  sodium chloride infusion  500 mL Intravenous Continuous Armbruster, Carlota Raspberry, MD        SURGICAL HISTORY:  Past Surgical History:  Procedure Laterality Date  . APPENDECTOMY    . BOWEL DECOMPRESSION N/A 06/12/2018   Procedure: BOWEL DECOMPRESSION;  Surgeon: Rush Landmark Telford Nab., MD;  Location: [facility];  Service: Gastroenterology;  Laterality: N/A;  . cataracts Bilateral   . COLECTOMY WITH COLOSTOMY CREATION/HARTMANN PROCEDURE  06/16/2018   Procedure: CREATION/HARTMANN COLOSTOMY;  Surgeon: Michael Boston, MD;  Location: [facility];  Service: General;;  . COLONOSCOPY    . COLOSTOMY N/A 06/16/2018   Procedure: POSSIBLE COLOSTOMY;  Surgeon: Michael Boston, MD;  Location: [facility];  Service: General;  Laterality: N/A;  . EYE SURGERY    . FLEXIBLE SIGMOIDOSCOPY N/A 06/12/2018   Procedure: FLEXIBLE SIGMOIDOSCOPY;  Surgeon: Rush Landmark Telford Nab., MD;  Location: [facility];  Service: Gastroenterology;  Laterality: N/A;  . LAPAROSCOPIC SIGMOID COLECTOMY N/A 06/16/2018   Procedure: LAPAROSCOPIC LOW ANTERIOR RESECTION;   Surgeon: Michael Boston, MD;  Location: [facility];  Service: General;  Laterality: N/A;  . LUNG SURGERY     Resection -" not done. Pt . tx with Tarceva  . MASTECTOMY Bilateral   . MEDIASTINOSCOPY N/A 10/02/2016   Procedure: MEDIASTINOSCOPY;  Surgeon: [name], MD;  Location: [facility] OR;  Service: Thoracic;  Laterality: N/A;  . RECONSTRUCTION BREAST W/ TRAM FLAP Bilateral    bilaterally  . robotic right hemicolectomy Right 03/06/2016   Dr. Johney Maine  . TEE WITHOUT CARDIOVERSION N/A 06/04/2013   Procedure: TRANSESOPHAGEAL ECHOCARDIOGRAM (TEE);  Surgeon: Jolaine Artist, MD;  Location: [facility] END[facility]COPY;  Service: Cardiovascular;  Laterality: N/A;  . TONSILLECTOMY    . TOTAL HIP ARTHROPLASTY Left 08/07/2016   Procedure: LEFT TOTAL HIP ARTHROPLASTY ANTERIOR APPROACH;  Surgeon: Gaynelle Arabian, MD;  Location: [facility];  Service: Orthopedics;  Laterality: Left;    REVIEW OF SYSTEMS:  A comprehensive review of systems  was negative except for: Constitutional: positive for fatigue Respiratory: positive for pleurisy/chest pain   PHYSICAL EXAMINATION: General appearance: alert, cooperative, fatigued and no distress Head: Normocephalic, without obvious abnormality, atraumatic Neck: no adenopathy, no JVD, supple, symmetrical, trachea midline and thyroid not enlarged, symmetric, no tenderness/mass/nodules Lymph nodes: Cervical, supraclavicular, and axillary nodes normal. Resp: clear to auscultation bilaterally Back: symmetric, no curvature. ROM normal. No CVA tenderness. Cardio: regular rate and rhythm, S1, S2 normal, no murmur, click, rub or gallop GI: soft, non-tender; bowel sounds normal; no masses,  no organomegaly Extremities: extremities normal, atraumatic, no cyanosis or edema  ECOG PERFORMANCE STATUS: 1 - Symptomatic but completely ambulatory  Blood pressure 135/66, pulse 69, temperature 97.9 F (36.6 C), temperature source Tympanic, resp. rate 18, height 5' 6" (1.676 m), weight 118 lb 9.6 oz  (53.8 kg), SpO2 97 %.  LABORATORY DATA: Lab Results  Component Value Date   WBC 5.2 07/25/2020   HGB 10.4 (L) 07/25/2020   HCT 32.7 (L) 07/25/2020   MCV 95.3 07/25/2020   PLT 268 07/25/2020      Chemistry      Component Value Date/Time   NA 140 07/25/2020 1056   NA 138 09/26/2017 0942   K 4.2 07/25/2020 1056   K 5.2 No visable hemolysis (H) 09/26/2017 0942   CL 105 07/25/2020 1056   CL 107 04/05/2013 0754   CO2 31 07/25/2020 1056   CO2 26 09/26/2017 0942   BUN 21 07/25/2020 1056   BUN 16.0 09/26/2017 0942   CREATININE 1.29 (H) 07/25/2020 1056   CREATININE 1.2 (H) 09/26/2017 0942      Component Value Date/Time   CALCIUM 10.1 07/25/2020 1056   CALCIUM 9.9 09/26/2017 0942   ALKPHOS 100 07/25/2020 1056   ALKPHOS 68 09/26/2017 0942   AST 15 07/25/2020 1056   AST 18 09/26/2017 0942   ALT 11 07/25/2020 1056   ALT 13 09/26/2017 0942   BILITOT 0.4 07/25/2020 1056   BILITOT 0.31 09/26/2017 0942       RADIOGRAPHIC STUDIES: No results found.  ASSESSMENT AND PLAN:  This is a very pleasant 79 years old white female with metastatic non-small cell lung cancer, adenocarcinoma diagnosed in March 2012 with positive EGFR mutation status post 68 months of treatment with Tarceva discontinued secondary to disease progression and development of EGFR T790M resistant mutation. The patient is currently on treatment with Tagrisso 80 mg by mouth daily status post 45 months.   The patient continues to tolerate her treatment with Tagrisso fairly well. I recommended for her to continue her current treatment with Tagrisso with the same dose. I will see her back for follow-up visit in 6 weeks for evaluation with repeat CT scan of the Chest, Abdomen and pelvis for restaging of her disease. For pain management I changed her pain medication to tramadol every 12 hours as needed.  She can continue with Tylenol also as needed basis. For the history of colon cancer she is currently on observation. For  the history of breast cancer she will continue her current treatment with Femara. For the anemia, she will continue with the oral iron tablets for now.  I will also check her iron study, ferritin, serum folate and vitamin B12 level with the next blood work. The patient was advised to call immediately if she has any concerning symptoms in the interval. The patient voices understanding of current disease status and treatment options and is in agreement with the current care plan. All questions were answered. The  patient knows to call the clinic with any problems, questions or concerns. We can certainly see the patient much sooner if necessary.  Disclaimer: This note was dictated with voice recognition software. Similar sounding words can inadvertently be transcribed and may not be corrected upon review.

## 2020-07-25 NOTE — Telephone Encounter (Signed)
Scheduled appointments per 10/5 los. Spoke to patient who is aware of appointment time and date. Gave patient calendar print out.

## 2020-08-04 ENCOUNTER — Encounter (INDEPENDENT_AMBULATORY_CARE_PROVIDER_SITE_OTHER): Payer: Medicare Other | Admitting: Ophthalmology

## 2020-08-30 ENCOUNTER — Emergency Department (HOSPITAL_COMMUNITY): Payer: Medicare Other

## 2020-08-30 ENCOUNTER — Inpatient Hospital Stay (HOSPITAL_COMMUNITY): Payer: Medicare Other

## 2020-08-30 ENCOUNTER — Inpatient Hospital Stay (HOSPITAL_COMMUNITY)
Admission: EM | Admit: 2020-08-30 | Discharge: 2020-09-05 | DRG: 981 | Disposition: A | Discharge status: Home Health | Payer: Medicare Other | Attending: Internal Medicine | Admitting: Internal Medicine

## 2020-08-30 ENCOUNTER — Encounter (HOSPITAL_COMMUNITY): Payer: Self-pay

## 2020-08-30 ENCOUNTER — Other Ambulatory Visit: Payer: Self-pay

## 2020-08-30 DIAGNOSIS — N183 Chronic kidney disease, stage 3 unspecified: Secondary | ICD-10-CM | POA: Diagnosis present

## 2020-08-30 DIAGNOSIS — C3491 Malignant neoplasm of unspecified part of right bronchus or lung: Secondary | ICD-10-CM | POA: Diagnosis not present

## 2020-08-30 DIAGNOSIS — Z853 Personal history of malignant neoplasm of breast: Secondary | ICD-10-CM

## 2020-08-30 DIAGNOSIS — R42 Dizziness and giddiness: Secondary | ICD-10-CM | POA: Diagnosis present

## 2020-08-30 DIAGNOSIS — Z20822 Contact with and (suspected) exposure to covid-19: Secondary | ICD-10-CM | POA: Diagnosis present

## 2020-08-30 DIAGNOSIS — I10 Essential (primary) hypertension: Secondary | ICD-10-CM | POA: Diagnosis present

## 2020-08-30 DIAGNOSIS — Y92009 Unspecified place in unspecified non-institutional (private) residence as the place of occurrence of the external cause: Secondary | ICD-10-CM

## 2020-08-30 DIAGNOSIS — Z832 Family history of diseases of the blood and blood-forming organs and certain disorders involving the immune mechanism: Secondary | ICD-10-CM

## 2020-08-30 DIAGNOSIS — Z86718 Personal history of other venous thrombosis and embolism: Secondary | ICD-10-CM

## 2020-08-30 DIAGNOSIS — Z85038 Personal history of other malignant neoplasm of large intestine: Secondary | ICD-10-CM

## 2020-08-30 DIAGNOSIS — W1830XA Fall on same level, unspecified, initial encounter: Secondary | ICD-10-CM | POA: Diagnosis present

## 2020-08-30 DIAGNOSIS — I480 Paroxysmal atrial fibrillation: Secondary | ICD-10-CM | POA: Diagnosis present

## 2020-08-30 DIAGNOSIS — I129 Hypertensive chronic kidney disease with stage 1 through stage 4 chronic kidney disease, or unspecified chronic kidney disease: Secondary | ICD-10-CM | POA: Diagnosis present

## 2020-08-30 DIAGNOSIS — Z801 Family history of malignant neoplasm of trachea, bronchus and lung: Secondary | ICD-10-CM

## 2020-08-30 DIAGNOSIS — Z882 Allergy status to sulfonamides status: Secondary | ICD-10-CM | POA: Diagnosis not present

## 2020-08-30 DIAGNOSIS — Z800 Family history of malignant neoplasm of digestive organs: Secondary | ICD-10-CM | POA: Diagnosis not present

## 2020-08-30 DIAGNOSIS — D638 Anemia in other chronic diseases classified elsewhere: Secondary | ICD-10-CM | POA: Diagnosis present

## 2020-08-30 DIAGNOSIS — Z9013 Acquired absence of bilateral breasts and nipples: Secondary | ICD-10-CM

## 2020-08-30 DIAGNOSIS — Z8673 Personal history of transient ischemic attack (TIA), and cerebral infarction without residual deficits: Secondary | ICD-10-CM

## 2020-08-30 DIAGNOSIS — Z96642 Presence of left artificial hip joint: Secondary | ICD-10-CM | POA: Diagnosis present

## 2020-08-30 DIAGNOSIS — Y9301 Activity, walking, marching and hiking: Secondary | ICD-10-CM | POA: Diagnosis present

## 2020-08-30 DIAGNOSIS — Z85118 Personal history of other malignant neoplasm of bronchus and lung: Secondary | ICD-10-CM

## 2020-08-30 DIAGNOSIS — Z881 Allergy status to other antibiotic agents status: Secondary | ICD-10-CM | POA: Diagnosis not present

## 2020-08-30 DIAGNOSIS — Z86711 Personal history of pulmonary embolism: Secondary | ICD-10-CM

## 2020-08-30 DIAGNOSIS — Z808 Family history of malignant neoplasm of other organs or systems: Secondary | ICD-10-CM

## 2020-08-30 DIAGNOSIS — S72011A Unspecified intracapsular fracture of right femur, initial encounter for closed fracture: Secondary | ICD-10-CM | POA: Diagnosis present

## 2020-08-30 DIAGNOSIS — W19XXXA Unspecified fall, initial encounter: Secondary | ICD-10-CM

## 2020-08-30 DIAGNOSIS — Z79899 Other long term (current) drug therapy: Secondary | ICD-10-CM

## 2020-08-30 DIAGNOSIS — Z885 Allergy status to narcotic agent status: Secondary | ICD-10-CM | POA: Diagnosis not present

## 2020-08-30 DIAGNOSIS — S72001A Fracture of unspecified part of neck of right femur, initial encounter for closed fracture: Secondary | ICD-10-CM

## 2020-08-30 DIAGNOSIS — Z419 Encounter for procedure for purposes other than remedying health state, unspecified: Secondary | ICD-10-CM

## 2020-08-30 DIAGNOSIS — Z8249 Family history of ischemic heart disease and other diseases of the circulatory system: Secondary | ICD-10-CM

## 2020-08-30 DIAGNOSIS — Z9049 Acquired absence of other specified parts of digestive tract: Secondary | ICD-10-CM | POA: Diagnosis not present

## 2020-08-30 DIAGNOSIS — N1832 Chronic kidney disease, stage 3b: Secondary | ICD-10-CM | POA: Diagnosis present

## 2020-08-30 DIAGNOSIS — Z803 Family history of malignant neoplasm of breast: Secondary | ICD-10-CM | POA: Diagnosis not present

## 2020-08-30 DIAGNOSIS — Z7901 Long term (current) use of anticoagulants: Secondary | ICD-10-CM

## 2020-08-30 DIAGNOSIS — D62 Acute posthemorrhagic anemia: Secondary | ICD-10-CM | POA: Diagnosis not present

## 2020-08-30 DIAGNOSIS — C3490 Malignant neoplasm of unspecified part of unspecified bronchus or lung: Secondary | ICD-10-CM | POA: Diagnosis present

## 2020-08-30 DIAGNOSIS — Z807 Family history of other malignant neoplasms of lymphoid, hematopoietic and related tissues: Secondary | ICD-10-CM | POA: Diagnosis not present

## 2020-08-30 DIAGNOSIS — Z7902 Long term (current) use of antithrombotics/antiplatelets: Secondary | ICD-10-CM

## 2020-08-30 HISTORY — DX: Cardiac arrhythmia, unspecified: I49.9

## 2020-08-30 HISTORY — DX: Hypothyroidism, unspecified: E03.9

## 2020-08-30 HISTORY — DX: Chronic kidney disease, unspecified: N18.9

## 2020-08-30 LAB — CBC
HCT: 36.1 % (ref 36.0–46.0)
Hemoglobin: 11.7 g/dL — ABNORMAL LOW (ref 12.0–15.0)
MCH: 31.6 pg (ref 26.0–34.0)
MCHC: 32.4 g/dL (ref 30.0–36.0)
MCV: 97.6 fL (ref 80.0–100.0)
Platelets: 268 10*3/uL (ref 150–400)
RBC: 3.7 MIL/uL — ABNORMAL LOW (ref 3.87–5.11)
RDW: 13.8 % (ref 11.5–15.5)
WBC: 11.9 10*3/uL — ABNORMAL HIGH (ref 4.0–10.5)
nRBC: 0 % (ref 0.0–0.2)

## 2020-08-30 LAB — CBG MONITORING, ED: Glucose-Capillary: 108 mg/dL — ABNORMAL HIGH (ref 70–99)

## 2020-08-30 LAB — BASIC METABOLIC PANEL
Anion gap: 9 (ref 5–15)
BUN: 21 mg/dL (ref 8–23)
CO2: 25 mmol/L (ref 22–32)
Calcium: 9.8 mg/dL (ref 8.9–10.3)
Chloride: 102 mmol/L (ref 98–111)
Creatinine, Ser: 1.19 mg/dL — ABNORMAL HIGH (ref 0.44–1.00)
GFR, Estimated: 46 mL/min — ABNORMAL LOW (ref >60)
Glucose, Bld: 117 mg/dL — ABNORMAL HIGH (ref 70–99)
Potassium: 4.2 mmol/L (ref 3.5–5.1)
Sodium: 136 mmol/L (ref 135–145)

## 2020-08-30 LAB — RESPIRATORY PANEL BY RT PCR (FLU A&B, COVID)
Influenza A by PCR: NEGATIVE
Influenza B by PCR: NEGATIVE
SARS Coronavirus 2 by RT PCR: NEGATIVE

## 2020-08-30 MED ORDER — CYCLOSPORINE 0.05 % OP EMUL
1.0000 [drp] | Freq: Two times a day (BID) | OPHTHALMIC | Status: DC
  Administered 2020-08-31 – 2020-09-05 (×10): 1 [drp] via OPHTHALMIC
  Filled 2020-08-30 (×12): qty 1

## 2020-08-30 MED ORDER — LACTATED RINGERS IV SOLN: INTRAVENOUS | Status: DC

## 2020-08-30 MED ORDER — ESCITALOPRAM OXALATE 10 MG PO TABS
10.0000 mg | ORAL_TABLET | Freq: Every day | ORAL | Status: DC
  Administered 2020-08-31 – 2020-09-05 (×6): 10 mg via ORAL
  Filled 2020-08-30 (×6): qty 1

## 2020-08-30 MED ORDER — HYDROMORPHONE HCL 1 MG/ML IJ SOLN
0.5000 mg | INTRAMUSCULAR | Status: DC | PRN
  Administered 2020-09-04: 0.5 mg via INTRAVENOUS
  Filled 2020-08-30: qty 0.5

## 2020-08-30 MED ORDER — INFLUENZA VAC A&B SA ADJ QUAD 0.5 ML IM PRSY
0.5000 mL | PREFILLED_SYRINGE | INTRAMUSCULAR | Status: DC
  Filled 2020-08-30: qty 0.5

## 2020-08-30 MED ORDER — OSIMERTINIB MESYLATE 80 MG PO TABS
80.0000 mg | ORAL_TABLET | Freq: Every day | ORAL | Status: DC
  Administered 2020-09-02: 80 mg via ORAL

## 2020-08-30 MED ORDER — LETROZOLE 2.5 MG PO TABS
2.5000 mg | ORAL_TABLET | Freq: Every day | ORAL | Status: DC
  Administered 2020-08-31 – 2020-09-05 (×5): 2.5 mg via ORAL
  Filled 2020-08-30 (×6): qty 1

## 2020-08-30 MED ORDER — TIMOLOL MALEATE 0.5 % OP SOLN
1.0000 [drp] | Freq: Two times a day (BID) | OPHTHALMIC | Status: DC
  Administered 2020-08-31 – 2020-09-05 (×10): 1 [drp] via OPHTHALMIC
  Filled 2020-08-30: qty 5

## 2020-08-30 MED ORDER — BRIMONIDINE TARTRATE 0.2 % OP SOLN
1.0000 [drp] | Freq: Two times a day (BID) | OPHTHALMIC | Status: DC
  Administered 2020-08-31 – 2020-09-05 (×10): 1 [drp] via OPHTHALMIC
  Filled 2020-08-30: qty 5

## 2020-08-30 MED ORDER — FUROSEMIDE 20 MG PO TABS
10.0000 mg | ORAL_TABLET | Freq: Every day | ORAL | Status: DC
  Administered 2020-08-31 – 2020-09-05 (×5): 10 mg via ORAL
  Filled 2020-08-30: qty 1
  Filled 2020-08-30 (×2): qty 0.5
  Filled 2020-08-30 (×3): qty 1

## 2020-08-30 MED ORDER — MORPHINE SULFATE (PF) 4 MG/ML IV SOLN
4.0000 mg | Freq: Once | INTRAVENOUS | Status: AC
  Administered 2020-08-30: 4 mg via INTRAVENOUS
  Filled 2020-08-30: qty 1

## 2020-08-30 MED ORDER — SENNOSIDES-DOCUSATE SODIUM 8.6-50 MG PO TABS: 1.0000 | ORAL_TABLET | Freq: Every evening | ORAL | Status: DC | PRN

## 2020-08-30 MED ORDER — ONDANSETRON HCL 4 MG/2ML IJ SOLN
4.0000 mg | Freq: Once | INTRAMUSCULAR | Status: AC
  Administered 2020-08-30: 4 mg via INTRAVENOUS
  Filled 2020-08-30: qty 2

## 2020-08-30 MED ORDER — AMIODARONE HCL 200 MG PO TABS
200.0000 mg | ORAL_TABLET | Freq: Every day | ORAL | Status: DC
  Administered 2020-08-31 – 2020-09-05 (×5): 200 mg via ORAL
  Filled 2020-08-30 (×6): qty 1

## 2020-08-30 MED ORDER — FENTANYL CITRATE (PF) 100 MCG/2ML IJ SOLN
50.0000 ug | Freq: Once | INTRAMUSCULAR | Status: AC
  Administered 2020-08-30: 50 ug via INTRAVENOUS
  Filled 2020-08-30: qty 2

## 2020-08-30 MED ORDER — ACETAMINOPHEN 500 MG PO TABS: 1000.0000 mg | ORAL_TABLET | Freq: Three times a day (TID) | ORAL | Status: DC | PRN

## 2020-08-30 MED ORDER — FAMOTIDINE 20 MG PO TABS
40.0000 mg | ORAL_TABLET | Freq: Two times a day (BID) | ORAL | Status: DC
  Administered 2020-08-31 – 2020-09-05 (×11): 40 mg via ORAL
  Filled 2020-08-30 (×11): qty 2

## 2020-08-30 MED ORDER — BRIMONIDINE TARTRATE-TIMOLOL 0.2-0.5 % OP SOLN
1.0000 [drp] | Freq: Two times a day (BID) | OPHTHALMIC | Status: DC
  Filled 2020-08-30: qty 5

## 2020-08-30 MED ORDER — TRAMADOL HCL 50 MG PO TABS
50.0000 mg | ORAL_TABLET | Freq: Two times a day (BID) | ORAL | Status: DC | PRN
  Administered 2020-09-02 – 2020-09-04 (×4): 50 mg via ORAL
  Filled 2020-08-30 (×4): qty 1

## 2020-08-30 MED ORDER — TAB-A-VITE/IRON PO TABS: 1.0000 | ORAL_TABLET | Freq: Every day | ORAL | Status: DC

## 2020-08-30 NOTE — ED Notes (Signed)
Pt on fall risk due to recent fall. Bed alarm on, fall risk bracelet on , bed in lowest  and locked position.

## 2020-08-30 NOTE — ED Provider Notes (Signed)
[facility] DEPT Provider Note   CSN: [id number] Arrival date & time: 08/30/20  7948     History Chief Complaint  Patient presents with  . Dizziness  . Fall    Kristin [name] is a 79 y.o. female.  The history is provided by the patient and medical records.  Dizziness Fall   Kristin [name] is a 79 y.o. female who presents to the Emergency Department complaining of fall.  She presents to the ED for evaluation following a fall.  She was coming in from outside when she became dizzy/unsteady and she called her husband.  He got to her as she was falling to prevent her head from hitting anything but she did strike her right hip.  She has significant pain in the area and is unable to stand/walk without difficulty.  The dizzy episode lasted a few minutes and is typical for episodes she has.  No recent illnesses.  Denies fever, HA, CP, AP, sob, N/V/D.  Has been fully vaccinated for COVID 19.  Lives with husband.  Takes plavix and xarelto.  Has a hx/o breast, colon, lung cancer.      Past Medical History:  Diagnosis Date  . Allergy   . Anemia   . Anemia of chronic disease 09/21/2016  . Anxiety   . Arthritis    arthritis- left hip  . Blood transfusion without reported diagnosis    transfusion- 3-4 yrs ago -found to be anemic on routine lab check  . Breast cancer ([place]) 2002   bilateral- bilateral mastectomies done.  . 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 CVA (cerebral infarction) 04/30/2013  . Encounter for antineoplastic chemotherapy 10/28/2016  . Family history of brain cancer   . Family history of breast cancer   . Family history of colon cancer 10/06/2009  . Family history of colon cancer   . Family history of prostate cancer   . GERD (gastroesophageal reflux disease)   . Glaucoma   . History of bilateral mastectomy 05/20/2010  . Hypertension   . Lung cancer ([place]) dx'd 12/2010   last Ct scan "no lung cancer" showing 11'16 CT Chest Epic.  . Malignant  neoplasm of ascending colon ([place]) 01/29/2016  . Stroke [facility])    2 yrs ago-no residual  . Tubular adenoma of colon 07/2011   colon polyps ans reoccurence with malignancy found    Patient Active Problem List   Diagnosis Date Noted  . 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 Thyroid nodule, hot 03/06/2020  . Goals of care, counseling/discussion 03/06/2020  . Persistent atrial fibrillation ([place])   . Acute respiratory failure ([place])   . Paroxysmal atrial fibrillation ([facility])   . Pulmonary embolism ([place]) 02/10/2020  . Acute pulmonary embolism ([place]) 02/09/2020  . Pleural effusion 02/09/2020  . 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 Malnutrition of moderate degree 06/15/2018  . CKD (chronic kidney disease), stage III ([place]) 06/11/2018  . Large bowel obstruction ([place]) 06/11/2018  . Acute lower UTI 06/11/2018  . Encounter for antineoplastic chemotherapy 10/28/2016  . Anemia of chronic disease 09/21/2016  . 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 S/P hip replacement, left 08/07/2016  . 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 Genetic testing 06/20/2016  . Family history of breast cancer   . Family history of colon cancer   . Family history of brain cancer   . Family history of prostate cancer   . Melanocytic nevi of  trunk 03/07/2016  . Seborrheic keratoses of trunk 03/06/2016  . Status post partial colectomy 03/06/2016  . 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 Osteoporosis 04/25/2015  . Disc disorder 05/30/2014  . Arthritis of left hip 05/30/2014  . Occipital cerebral infarction ([place]) 04/30/2013  . Hypertension 04/29/2013  . Nonspecific abnormal finding in stool contents 07/31/2011  . Esophageal reflux 07/31/2011  . Benign neoplasm of colon 07/31/2011  . Hernia, hiatal 07/31/2011  . Cancer of upper lobe of left lung ([place]) 06/13/2011  . Personal history  of breast cancer 06/13/2011  . Diverticulosis of colon (without mention of hemorrhage) 06/13/2011  . Adenocarcinoma of lung ([place]) 01/01/2011  . Status of breast implant 05/20/2010  . Menopausal syndrome 07/03/2004  . Hypercholesterolemia 01/05/2002  . Absence of bladder continence 12/02/2000  . Depression 12/02/2000    Past Surgical History:  Procedure Laterality Date  . APPENDECTOMY    . BOWEL DECOMPRESSION N/A 06/12/2018   Procedure: BOWEL DECOMPRESSION;  Surgeon: Rush Landmark Telford Nab., MD;  Location: [facility];  Service: Gastroenterology;  Laterality: N/A;  . cataracts Bilateral   . COLECTOMY WITH COLOSTOMY CREATION/HARTMANN PROCEDURE  06/16/2018   Procedure: CREATION/HARTMANN COLOSTOMY;  Surgeon: Michael Boston, MD;  Location: [facility];  Service: General;;  . COLONOSCOPY    . COLOSTOMY N/A 06/16/2018   Procedure: POSSIBLE COLOSTOMY;  Surgeon: Michael Boston, MD;  Location: [facility];  Service: General;  Laterality: N/A;  . EYE SURGERY    . FLEXIBLE SIGMOIDOSCOPY N/A 06/12/2018   Procedure: FLEXIBLE SIGMOIDOSCOPY;  Surgeon: Rush Landmark Telford Nab., MD;  Location: [facility];  Service: Gastroenterology;  Laterality: N/A;  . LAPAROSCOPIC SIGMOID COLECTOMY N/A 06/16/2018   Procedure: LAPAROSCOPIC LOW ANTERIOR RESECTION;  Surgeon: Michael Boston, MD;  Location: [facility];  Service: General;  Laterality: N/A;  . LUNG SURGERY     Resection -" not done. Pt . tx with Tarceva  . MASTECTOMY Bilateral   . MEDIASTINOSCOPY N/A 10/02/2016   Procedure: MEDIASTINOSCOPY;  Surgeon: [name], MD;  Location: [facility] OR;  Service: Thoracic;  Laterality: N/A;  . RECONSTRUCTION BREAST W/ TRAM FLAP Bilateral    bilaterally  . robotic right hemicolectomy Right 03/06/2016   Dr. Johney Maine  . TEE WITHOUT CARDIOVERSION N/A 06/04/2013   Procedure: TRANSESOPHAGEAL ECHOCARDIOGRAM (TEE);  Surgeon: Jolaine Artist, MD;  Location: [facility];  Service: Cardiovascular;  Laterality: N/A;  . TONSILLECTOMY    .  TOTAL HIP ARTHROPLASTY Left 08/07/2016   Procedure: LEFT TOTAL HIP ARTHROPLASTY ANTERIOR APPROACH;  Surgeon: Gaynelle Arabian, MD;  Location: [facility];  Service: Orthopedics;  Laterality: Left;     OB History   No obstetric history on file.     Family History  Problem Relation Age of Onset  . Lung cancer Father   . Heart disease Mother   . Clotting disorder Mother 30       coronary thrombosis  . Colon cancer Sister        dx in her 83s  . Breast cancer Sister 33  . Multiple myeloma Paternal Grandmother   . Brain cancer Paternal Uncle   . Cancer Paternal Aunt        unknown  . Colon cancer Maternal Aunt 80  . Prostate cancer Maternal Uncle        dx in his late 25s  . Other Maternal Uncle        brain tumor dx in his 27s  . Lung cancer Maternal Uncle   . Cancer Maternal  Uncle        NOS  . Stomach cancer Paternal Aunt   . Brain cancer Cousin        maternal first cousin dx in late 6s-50s  . Cancer Paternal Uncle        NOS  . Colon cancer Cousin        paternal cousin dx in his 38s  . Cancer Cousin        paternal cousin dx with NOS cancer    Social History   Tobacco Use  . Smoking status: Never Smoker  . Smokeless tobacco: Never Used  Vaping Use  . Vaping Use: Never used  Substance Use Topics  . Alcohol use: Yes    Alcohol/week: 0.0 standard drinks    Comment: ocassional beer  . Drug use: No    Home Medications Prior to Admission medications   Medication Sig Start Date End Date Taking? Authorizing Provider  amiodarone (PACERONE) 200 MG tablet Take 1 tablet (200 mg total) by mouth daily. 04/25/20 07/24/20  Verta Ellen., NP  amLODipine (NORVASC) 5 MG tablet Take by mouth. 04/10/20   [provider]  brimonidine-timolol (COMBIGAN) 0.2-0.5 % ophthalmic solution Place 1 drop into the right eye every 12 (twelve) hours.    [provider]  Cholecalciferol 50 MCG (2000 UT) CAPS Take 1 tablet by mouth daily. 06/19/20   [provider]    clopidogrel (PLAVIX) 75 MG tablet Take 75 mg by mouth daily. 06/25/16   [provider]  cycloSPORINE (RESTASIS) 0.05 % ophthalmic emulsion Place 1 drop into both eyes 2 (two) times daily.    [provider]  escitalopram (LEXAPRO) 10 MG tablet Take 10 mg by mouth daily.  05/20/13   Philmore Pali, NP  famotidine (PEPCID) 40 MG tablet Take 40 mg by mouth daily.    [provider]  feeding supplement, ENSURE ENLIVE, (ENSURE ENLIVE) LIQD Take 237 mLs by mouth 2 (two) times daily between meals. 06/25/18   Meuth, Brooke A, PA-C  FeFum-FePoly-FA-B Cmp-C-Biot (FOLIVANE-PLUS) CAPS TAKE 1 CAPSULE BY MOUTH EVERY DAY IN THE MORNING Patient taking differently: Take 1 capsule by mouth daily.  12/20/19   Curt Bears, MD  furosemide (LASIX) 20 MG tablet Take 20 mg by mouth.    [provider]  letrozole (FEMARA) 2.5 MG tablet TAKE 1 TABLET BY MOUTH EVERY DAY 06/28/20   Curt Bears, MD  mirtazapine (REMERON) 7.5 MG tablet Take 7.5 mg by mouth at bedtime.  04/02/20   [provider]  Multiple Vitamins-Iron (MULTIVITAMINS WITH IRON) TABS tablet Take 1 tablet by mouth daily. 06/25/18   Meuth, Blaine Hamper, PA-C  Multiple Vitamins-Minerals (PRESERVISION AREDS 2) CAPS Take by mouth.    [provider]  osimertinib mesylate (TAGRISSO) 80 MG tablet Take 1 tablet (80 mg total) by mouth daily. 05/18/20   Curt Bears, MD  prochlorperazine (COMPAZINE) 10 MG tablet Take 1 tablet (10 mg total) by mouth every 6 (six) hours as needed for nausea or vomiting. 07/03/20   Curt Bears, MD  rivaroxaban (XARELTO) 20 MG TABS tablet Take 20 mg by mouth daily with supper.    [provider]  traMADol (ULTRAM) 50 MG tablet Take 1 tablet (50 mg total) by mouth every 12 (twelve) hours as needed. 07/25/20   Curt Bears, MD    Allergies    Doxycycline and Sulfa antibiotics  Review of Systems   Review of Systems  Neurological: Positive for dizziness.  All other systems  reviewed and are negative.   Physical Exam Updated Vital Signs BP (!) 137/59   Pulse 66   Temp 97.7 F (36.5 C) (Oral)   Resp 17   SpO2 97%   Physical Exam Vitals and nursing note reviewed.  Constitutional:      Appearance: She is well-developed.  HENT:     Head: Normocephalic and atraumatic.  Cardiovascular:     Rate and Rhythm: Normal rate and regular rhythm.     Heart sounds: No murmur heard.   Pulmonary:     Effort: Pulmonary effort is normal. No respiratory distress.     Breath sounds: Normal breath sounds.  Abdominal:     Palpations: Abdomen is soft.     Tenderness: There is no abdominal tenderness. There is no guarding or rebound.     Comments: Colostomy in LLQ  Musculoskeletal:        General: Tenderness present.     Comments: TTP over right hip  Skin:    General: Skin is warm and dry.  Neurological:     Mental Status: She is alert and oriented to person, place, and time.  Psychiatric:        Behavior: Behavior normal.     ED Results / Procedures / Treatments   Labs (all labs ordered are listed, but only abnormal results are displayed) Labs Reviewed  BASIC METABOLIC PANEL - Abnormal; Notable for the following components:      Result Value   Glucose, Bld 117 (*)    Creatinine, Ser 1.19 (*)    GFR, Estimated 46 (*)    All other components within normal limits  CBC - Abnormal; Notable for the following components:   WBC 11.9 (*)    RBC 3.70 (*)    Hemoglobin 11.7 (*)    All other components within normal limits  CBG MONITORING, ED - Abnormal; Notable for the following components:   Glucose-Capillary 108 (*)    All other components within normal limits  RESPIRATORY PANEL BY RT PCR (FLU A&B, COVID)  URINALYSIS, ROUTINE W REFLEX MICROSCOPIC    EKG None  Radiology DG Chest 1 View  Result Date: 08/30/2020 CLINICAL DATA:  Fall EXAM: CHEST  1 VIEW COMPARISON:  June 12, 2020 FINDINGS: The heart size and mediastinal contours are mildly enlarged.  Aortic knob calcifications are seen. A hiatal hernia is noted. There is a small left pleural effusion. Surgical sutures are seen within the left lung apex. There is a 2.5 cm ill-defined nodular opacity seen in the periphery of the right upper lung. No acute osseous abnormality. IMPRESSION: Small left pleural effusion Mild cardiomegaly. Ill-defined nodular opacity in the right upper lung which could be due to focal infectious etiology, however cannot exclude pulmonary nodule. If further evaluation is required would recommend CT chest. Electronically Signed   By: Prudencio Pair M.D.   On: 08/30/2020 18:32   DG Hip Unilat  With Pelvis 2-3 Views Right  Result Date: 08/30/2020 CLINICAL DATA:  79 year old female with fall and right hip pain. EXAM: DG HIP (WITH OR WITHOUT PELVIS) 2-3V RIGHT COMPARISON:  CT abdomen pelvis dated 06/12/2020. FINDINGS: There is a nondisplaced fracture of the right femoral neck with mild impaction. No dislocation. The bones are osteopenic. There is a total left hip arthroplasty. The arthroplasty components appear intact. Degenerative changes of the lower lumbar spine. The soft tissues are unremarkable. IMPRESSION: Nondisplaced fracture of the right femoral neck. Electronically Signed   By: Anner Crete M.D.   On: 08/30/2020  18:23    Procedures Procedures (including critical care time)  Medications Ordered in ED Medications  fentaNYL (SUBLIMAZE) injection 50 mcg (50 mcg Intravenous Given 08/30/20 1732)  ondansetron (ZOFRAN) injection 4 mg (4 mg Intravenous Given 08/30/20 1731)    ED Course  I have reviewed the triage vital signs and the nursing notes.  Pertinent labs & imaging results that were available during my care of the patient were reviewed by me and considered in my medical decision making (see chart for details).    MDM Rules/Calculators/A&P                         patient here for evaluation of injuries after getting dizzy and falling. She has pain to her  right hip. Imaging is significant for right femoral neck fracture. Discussed with Dr. Stann Mainland, with numbers ortho. Plan for repair by Dr. Lyla Glassing on Friday. He recommends MRI of the right femur to assess for metastatic lesion. Hospitalist consulted for admission for further treatment.  Final Clinical Impression(s) / ED Diagnoses Final diagnoses:  Closed displaced fracture of right femoral neck ([place])  Fall in home, initial encounter    Rx / [place] Orders ED Discharge Orders    None       Quintella Reichert, MD 08/30/20 2104

## 2020-08-30 NOTE — H&P (Signed)
History and Physical    Kristin [name] ZOX:[id number] DOB: 08-09-1941 DOA: 08/30/2020  PCP: Eber Hong, MD   Patient coming from:  Home  Chief Complaint: Right hip pain after a fall at home.   HPI: Kristin [name] is a 79 y.o. female with medical history significant for adenocarcinoma of lungs, history of breast cancer and colon cancer -followed by oncology, paroxysmal atrial fibrillation, hypertension, CKD stage III, anemia of chronic disease, PE and DVT in spring 2021.  Kristin [name] was at home sitting on her deck when she got up to go inside.  She reports having some dizziness after she stood up to walk back inside.  She reports is not uncommon that she has dizziness occasionally.  Tonight she was walking inside dizziness was profound and she felt weak and fell to the ground.  Husband was by her and kept her from hitting her head.  She denies any loss of consciousness.  She did land on her right side and reported pain in her right hip and was able to stand up after the fall.  EMS was called she was brought to the emergency room where she was found to have a right femoral neck fracture.  She denies having any chest pain, palpitations, slurred speech, drooping face, change in speech prior to event.  She does not have any numbness of her right leg.  She states she did not trip over anything that she knows of but simply lost her balance after becoming dizzy. She states he has been taking her regular medications which are listed in chart.  She did not have her Xarelto tonight prior to coming to the hospital.   Review of Systems:  General: Reports dizziness intermittently. Denies weakness, fever, chills, weight loss, night sweats. Denies change in appetite HENT: Denies head trauma, headache, denies change in hearing, tinnitus.  Denies nasal congestion or bleeding.  Denies sore throat, sores in mouth.  Eyes: Denies blurry vision, pain in eye, drainage.  Denies discoloration of eyes. Neck: Denies pain.   Denies swelling.  Denies pain with movement. Cardiovascular: Denies chest pain, palpitations.  Denies edema.  Denies orthopnea Respiratory: Denies shortness of breath, cough.  Denies wheezing.  Denies sputum production Gastrointestinal: Denies abdominal pain, swelling.  Denies nausea, vomiting, diarrhea.  Denies melena.  Denies hematemesis. Musculoskeletal: Reports pain right hip and decreased movement right hip and leg. Denies deformity or swelling. Denies arthralgias or myalgias. Genitourinary: Denies pelvic pain.  Denies urinary frequency or hesitancy.  Denies dysuria.  Skin: Denies rash.  Denies petechiae, purpura, ecchymosis. Neurological: Denies headache.  Denies syncope.  Denies seizure activity.  Denies paresthesia.  Denies slurred speech, drooping face.  Denies visual change. Psychiatric: Denies depression, anxiety.  Denies suicidal thoughts or ideation.  Denies hallucinations.  Past Medical History:  Diagnosis Date  . Allergy   . Anemia   . Anemia of chronic disease 09/21/2016  . Anxiety   . Arthritis    arthritis- left hip  . Blood transfusion without reported diagnosis    transfusion- 3-4 yrs ago -found to be anemic on routine lab check  . Breast cancer ([place]) 2002   bilateral- bilateral mastectomies done.  . Chronic kidney disease   . 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 CVA (cerebral infarction) 04/30/2013  . Dysrhythmia   . Encounter for antineoplastic chemotherapy 10/28/2016  . Family history of brain cancer   . Family history of breast cancer   . Family history of colon cancer 10/06/2009  . Family history of colon  cancer   . Family history of prostate cancer   . GERD (gastroesophageal reflux disease)   . Glaucoma   . History of bilateral mastectomy 05/20/2010  . Hypertension   . Hypothyroidism   . Lung cancer ([place]) dx'd 12/2010   last Ct scan "no lung cancer" showing 11'16 CT Chest Epic.  . Malignant neoplasm of ascending colon ([place]) 01/29/2016  . Stroke [facility])    2 yrs  ago-no residual  . Tubular adenoma of colon 07/2011   colon polyps ans reoccurence with malignancy found    Past Surgical History:  Procedure Laterality Date  . APPENDECTOMY    . BOWEL DECOMPRESSION N/A 06/12/2018   Procedure: BOWEL DECOMPRESSION;  Surgeon: Rush Landmark Telford Nab., MD;  Location: [facility];  Service: Gastroenterology;  Laterality: N/A;  . cataracts Bilateral   . COLECTOMY WITH COLOSTOMY CREATION/HARTMANN PROCEDURE  06/16/2018   Procedure: CREATION/HARTMANN COLOSTOMY;  Surgeon: Michael Boston, MD;  Location: [facility];  Service: General;;  . COLONOSCOPY    . COLOSTOMY N/A 06/16/2018   Procedure: POSSIBLE COLOSTOMY;  Surgeon: Michael Boston, MD;  Location: [facility];  Service: General;  Laterality: N/A;  . EYE SURGERY    . FLEXIBLE SIGMOIDOSCOPY N/A 06/12/2018   Procedure: FLEXIBLE SIGMOIDOSCOPY;  Surgeon: Rush Landmark Telford Nab., MD;  Location: [facility];  Service: Gastroenterology;  Laterality: N/A;  . LAPAROSCOPIC SIGMOID COLECTOMY N/A 06/16/2018   Procedure: LAPAROSCOPIC LOW ANTERIOR RESECTION;  Surgeon: Michael Boston, MD;  Location: [facility];  Service: General;  Laterality: N/A;  . LUNG SURGERY     Resection -" not done. Pt . tx with Tarceva  . MASTECTOMY Bilateral   . MEDIASTINOSCOPY N/A 10/02/2016   Procedure: MEDIASTINOSCOPY;  Surgeon: [name], MD;  Location: [facility] OR;  Service: Thoracic;  Laterality: N/A;  . RECONSTRUCTION BREAST W/ TRAM FLAP Bilateral    bilaterally  . robotic right hemicolectomy Right 03/06/2016   Dr. Johney Maine  . TEE WITHOUT CARDIOVERSION N/A 06/04/2013   Procedure: TRANSESOPHAGEAL ECHOCARDIOGRAM (TEE);  Surgeon: Jolaine Artist, MD;  [facility];  Service: Cardiovascular;  Laterality: N/A;  . TONSILLECTOMY    . TOTAL HIP ARTHROPLASTY Left 08/07/2016   Procedure: LEFT TOTAL HIP ARTHROPLASTY ANTERIOR APPROACH;  Surgeon: Gaynelle Arabian, MD;  Location: [facility];  Service: Orthopedics;  Laterality: Left;    Social History  reports  that she has never smoked. She has never used smokeless tobacco. She reports current alcohol use. She reports that she does not use drugs.  Allergies  Allergen Reactions  . Oxycodone Other (See Comments)    Not tolerate well  . Doxycycline Nausea Only    Weak, stomach  . Sulfa Antibiotics Rash    Family History  Problem Relation Age of Onset  . Lung cancer Father   . Heart disease Mother   . Clotting disorder Mother 81       coronary thrombosis  . Colon cancer Sister        dx in her 15s  . Breast cancer Sister 58  . Multiple myeloma Paternal Grandmother   . Brain cancer Paternal Uncle   . Cancer Paternal Aunt        unknown  . Colon cancer Maternal Aunt 80  . Prostate cancer Maternal Uncle        dx in his late 37s  . Other Maternal Uncle        brain tumor dx in his 8s  . Lung cancer Maternal Uncle   . Cancer Maternal Uncle  NOS  . Stomach cancer Paternal Aunt   . Brain cancer Cousin        maternal first cousin dx in late 82s-50s  . Cancer Paternal Uncle        NOS  . Colon cancer Cousin        paternal cousin dx in his 40s  . Cancer Cousin        paternal cousin dx with NOS cancer     Prior to Admission medications   Medication Sig Start Date End Date Taking? Authorizing Provider  acetaminophen (TYLENOL) 500 MG tablet Take 1,000 mg by mouth in the morning and at bedtime.   Yes [provider]  amiodarone (PACERONE) 200 MG tablet Take 1 tablet (200 mg total) by mouth daily. 04/25/20 08/30/20 Yes Verta Ellen., NP  brimonidine-timolol (COMBIGAN) 0.2-0.5 % ophthalmic solution Place 1 drop into the right eye every 12 (twelve) hours.   Yes [provider]  clopidogrel (PLAVIX) 75 MG tablet Take 75 mg by mouth daily. 06/25/16  Yes [provider]  cycloSPORINE (RESTASIS) 0.05 % ophthalmic emulsion Place 1 drop into both eyes 2 (two) times daily.   Yes [provider]  escitalopram (LEXAPRO) 10 MG tablet Take 10 mg by mouth  daily.  05/20/13  Yes Philmore Pali, NP  famotidine (PEPCID) 20 MG tablet Take 40 mg by mouth 2 (two) times daily.   Yes [provider]  feeding supplement, ENSURE ENLIVE, (ENSURE ENLIVE) LIQD Take 237 mLs by mouth 2 (two) times daily between meals. 06/25/18  Yes Meuth, Brooke A, PA-C  FeFum-FePoly-FA-B Cmp-C-Biot (FOLIVANE-PLUS) CAPS TAKE 1 CAPSULE BY MOUTH EVERY DAY IN THE MORNING Patient taking differently: Take 1 capsule by mouth daily.  12/20/19  Yes Curt Bears, MD  furosemide (LASIX) 20 MG tablet Take 10 mg by mouth daily.    Yes [provider]  letrozole ([place]) 2.5 MG tablet TAKE 1 TABLET BY MOUTH EVERY DAY Patient taking differently: Take 2.5 mg by mouth daily.  06/28/20  Yes Curt Bears, MD  Multiple Vitamins-Minerals (PRESERVISION AREDS 2) CAPS Take 1 capsule by mouth in the morning and at bedtime.    Yes [provider]  osimertinib mesylate (TAGRISSO) 80 MG tablet Take 1 tablet (80 mg total) by mouth daily. 05/18/20  Yes Curt Bears, MD  prochlorperazine (COMPAZINE) 10 MG tablet Take 1 tablet (10 mg total) by mouth every 6 (six) hours as needed for nausea or vomiting. 07/03/20  Yes Curt Bears, MD  rivaroxaban (XARELTO) 20 MG TABS tablet Take 20 mg by mouth daily with supper.   Yes [provider]  traMADol (ULTRAM) 50 MG tablet Take 1 tablet (50 mg total) by mouth every 12 (twelve) hours as needed. Patient taking differently: Take 50 mg by mouth every 12 (twelve) hours as needed for moderate pain.  07/25/20  Yes Curt Bears, MD  Multiple Vitamins-Iron (MULTIVITAMINS WITH IRON) TABS tablet Take 1 tablet by mouth daily. Patient not taking: Reported on 08/30/2020 06/25/18   Wellington Hampshire, PA-C    Physical Exam: Vitals:   08/30/20 1611 08/30/20 1730  BP: (!) 177/72 (!) 137/59  Pulse: 71 66  Resp: 16 17  Temp: 97.7 F (36.5 C)   TempSrc: Oral   SpO2: 98% 97%    Constitutional: NAD, calm, comfortable Vitals:   08/30/20 1611  08/30/20 1730  BP: (!) 177/72 (!) 137/59  Pulse: 71 66  Resp: 16 17  Temp: 97.7 F (36.5 C)   TempSrc:  Oral   SpO2: 98% 97%   General: WDWN, Alert and oriented x3.  Eyes: EOMI, PERRL, lids and conjunctivae normal.  Sclera nonicteric HENT:  [place]/AT, external ears normal.  Nares patent without epistasis.  Mucous membranes are moist. Posterior pharynx clear of any exudate or lesions. Normal dentition.  Neck: Soft, normal range of motion, supple, no masses, no thyromegaly.  Trachea midline Respiratory: clear to auscultation bilaterally, no wheezing, no crackles. Normal respiratory effort. No accessory muscle use.  Cardiovascular: Regular rate and rhythm, no murmurs / rubs / gallops. No extremity edema. 2+ pedal pulses.   Abdomen: Soft, no tenderness, nondistended, no rebound or guarding.  No masses palpated. Bowel sounds normoactive Musculoskeletal: Right hip tender to patient in the anterior and lateral aspect.  Does not move right hip or leg secondary to pain.  Normal movement of upper extremities and left lower extremity.  No clubbing / cyanosis. Normal muscle tone.  Skin: Warm, dry, intact no rashes, lesions, ulcers. No induration Neurologic: CN 2-12 grossly intact.  Normal speech.  Sensation intact, patella DTR +1 bilaterally. Strength 5/5 left lower and bilateral upper extremities.   Psychiatric: Normal judgment and insight.  Normal mood.    Labs on Admission: I have personally reviewed following labs and imaging studies  CBC: Recent Labs  Lab 08/30/20 1700  WBC 11.9*  HGB 11.7*  HCT 36.1  MCV 97.6  PLT 916    Basic Metabolic Panel: Recent Labs  Lab 08/30/20 1700  NA 136  K 4.2  CL 102  CO2 25  GLUCOSE 117*  BUN 21  CREATININE 1.19*  CALCIUM 9.8    GFR: CrCl cannot be calculated (Unknown ideal weight.).  Liver Function Tests: No results for input(s): AST, ALT, ALKPHOS, BILITOT, PROT, ALBUMIN in the last 168 hours.  Urine analysis:    Component Value Date/Time     COLORURINE YELLOW 02/09/2020 2036   APPEARANCEUR HAZY (A) 02/09/2020 2036   LABSPEC 1.031 (H) 02/09/2020 2036   PHURINE 5.0 02/09/2020 2036   GLUCOSEU NEGATIVE 02/09/2020 2036   HGBUR NEGATIVE 02/09/2020 2036   BILIRUBINUR NEGATIVE 02/09/2020 2036   KETONESUR NEGATIVE 02/09/2020 2036   PROTEINUR 100 (A) 02/09/2020 2036   UROBILINOGEN 0.2 04/29/2013 2243   NITRITE NEGATIVE 02/09/2020 2036   LEUKOCYTESUR LARGE (A) 02/09/2020 2036    Radiological Exams on Admission: DG Chest 1 View  Result Date: 08/30/2020 CLINICAL DATA:  Fall EXAM: CHEST  1 VIEW COMPARISON:  June 12, 2020 FINDINGS: The heart size and mediastinal contours are mildly enlarged. Aortic knob calcifications are seen. A hiatal hernia is noted. There is a small left pleural effusion. Surgical sutures are seen within the left lung apex. There is a 2.5 cm ill-defined nodular opacity seen in the periphery of the right upper lung. No acute osseous abnormality. IMPRESSION: Small left pleural effusion Mild cardiomegaly. Ill-defined nodular opacity in the right upper lung which could be due to focal infectious etiology, however cannot exclude pulmonary nodule. If further evaluation is required would recommend CT chest. Electronically Signed   By: Prudencio Pair M.D.   On: 08/30/2020 18:32   DG Hip Unilat  With Pelvis 2-3 Views Right  Result Date: 08/30/2020 CLINICAL DATA:  79 year old female with fall and right hip pain. EXAM: DG HIP (WITH OR WITHOUT PELVIS) 2-3V RIGHT COMPARISON:  CT abdomen pelvis dated 06/12/2020. FINDINGS: There is a nondisplaced fracture of the right femoral neck with mild impaction. No dislocation. The bones are osteopenic. There is a total left hip arthroplasty.  The arthroplasty components appear intact. Degenerative changes of the lower lumbar spine. The soft tissues are unremarkable. IMPRESSION: Nondisplaced fracture of the right femoral neck. Electronically Signed   By: Anner Crete M.D.   On: 08/30/2020 18:23     EKG:   pending  Assessment/Plan Principal Problem:   Closed displaced fracture of right femoral neck ([place]) Ms. Podesta is admitted to telemetry floor.  Orthopedics has been consulted and plans for surgical repair of hip fracture on Friday.  Patient wants to be off of Xarelto for 48 hours prior to surgical repair.  Normally takes Xarelto at night but did not take it tonight before coming to the emergency room. Pain control provided with IV Dilaudid for severe pain.  Tylenol and tramadol for moderate pain that she takes at home and states works for her. PT/H to be consulted in the morning for recommendations on rehab after surgical repair of inpatient rehab versus home health  Active Problems:   Hypertension We will monitor blood pressure and treat if contains elevated levels.    CKD (chronic kidney disease), stage III (HCC) Stable.  Monitor electrolytes and renal function in morning with labs IV fluid hydration with LR at 50 mils per hour overnight    Paroxysmal atrial fibrillation (HCC) Continue home dose of amiodarone.  Monitor rhythm.    Fall at home, initial encounter Patient with no loss of consciousness.  Patient had mechanical fall    Personal history of breast cancer Followed by oncology    Adenocarcinoma of lung ([place]) Followed by oncology.  Continue home chemotherapy regimen     DVT prophylaxis: SCDs for DVT prophylaxis. Xarelto held with anticipated surgery for repair of fractured femoral neck  Code Status:   Full Code  Family Communication:  Diagnosis and plan discussed with patient and her daughter who is at bedside.  They both verbalized understanding.  Agree with plan.  Questions were answered.  Further recommendations to follow as indicated Disposition Plan:   Patient is from:  Home  Anticipated DC to:  Home with home health vs. SNF for rehab  Anticipated DC date:  Greater than 2 midnights required for treatment of acute medical condition  Anticipated DC  barriers:          No barriers to discharge identified at this time. Transition team to evaluate in am.   Consults called:  Full Code Admission status:  Inpatient   Yevonne Aline Raife Lizer MD [facility]  How to contact the [facility] Attending or Consulting provider [place] or covering provider during after hours [place], for this patient?   1. Check the care team in [facility] and look for a) attending/consulting [facility] provider listed and b) the [facility] team listed 2. Log into [email or web address] and use [place]'s universal password to access. If you do not have the password, please contact the hospital operator. 3. Locate the [facility] provider you are looking for under [facility] and page to a number that you can be directly reached. 4. If you still have difficulty reaching the provider, please page the [facility] (Director on Call) for the Hospitalists listed on amion for assistance.  08/30/2020, 8:24 PM

## 2020-08-30 NOTE — ED Triage Notes (Signed)
Pt reports she became dizzy and fell over. Pt denies LOC or hitting her head. Pt is on blood thinners. Pt now endorses right hip pain upon movement. Pt does have cancer and currently takes chemo pills.

## 2020-08-30 NOTE — Progress Notes (Signed)
Discussed case with EDP.  Patient will need right total hip replacement re: femoral neck fracture.  Given xarelto and plavixx will need to wait until Friday pm.  Also request MRI righ tfemur to rule out metastatic disease of femur.  Full consult to come in am.

## 2020-08-31 DIAGNOSIS — N1832 Chronic kidney disease, stage 3b: Secondary | ICD-10-CM

## 2020-08-31 DIAGNOSIS — C3491 Malignant neoplasm of unspecified part of right bronchus or lung: Secondary | ICD-10-CM

## 2020-08-31 DIAGNOSIS — S72001A Fracture of unspecified part of neck of right femur, initial encounter for closed fracture: Secondary | ICD-10-CM | POA: Diagnosis not present

## 2020-08-31 LAB — CBC
HCT: 28.8 % — ABNORMAL LOW (ref 36.0–46.0)
Hemoglobin: 9.1 g/dL — ABNORMAL LOW (ref 12.0–15.0)
MCH: 31.1 pg (ref 26.0–34.0)
MCHC: 31.6 g/dL (ref 30.0–36.0)
MCV: 98.3 fL (ref 80.0–100.0)
Platelets: 213 10*3/uL (ref 150–400)
RBC: 2.93 MIL/uL — ABNORMAL LOW (ref 3.87–5.11)
RDW: 14.1 % (ref 11.5–15.5)
WBC: 8.7 10*3/uL (ref 4.0–10.5)
nRBC: 0 % (ref 0.0–0.2)

## 2020-08-31 LAB — URINALYSIS, ROUTINE W REFLEX MICROSCOPIC
Bilirubin Urine: NEGATIVE
Glucose, UA: NEGATIVE mg/dL
Hgb urine dipstick: NEGATIVE
Ketones, ur: NEGATIVE mg/dL
Nitrite: POSITIVE — AB
Protein, ur: NEGATIVE mg/dL
Specific Gravity, Urine: 1.021 (ref 1.005–1.030)
pH: 5 (ref 5.0–8.0)

## 2020-08-31 MED ORDER — ACETAMINOPHEN 500 MG PO TABS: 1000.0000 mg | ORAL_TABLET | Freq: Three times a day (TID) | ORAL | Status: DC | PRN

## 2020-08-31 NOTE — H&P (View-Only) (Signed)
ORTHOPAEDIC CONSULTATION  REQUESTING PHYSICIAN: Caren Griffins, MD  PCP:  Eber Hong, MD   Chief Complaint: Right hip pain  HPI: Kristin [name] is a 79 y.o. female who complains of right hip pain secondary to a fall. The patient has past medical history significant for adenocarcinoma of lungs, history of breast cancer and colon cancer followed by oncology, paroxysmal atrial fibrillation, hypertension, CKD stage III, anemia of chronic disease, and PE and DVT in spring 2021. The patient was walking inside when she felt dizzy and weak. She fell and experienced immediate pain in her right hip. EMS brought her to the emergency department where she was found to have a right femoral neck fracture.   Past Medical History:  Diagnosis Date  . Allergy   . Anemia   . Anemia of chronic disease 09/21/2016  . Anxiety   . Arthritis    arthritis- left hip  . Blood transfusion without reported diagnosis    transfusion- 3-4 yrs ago -found to be anemic on routine lab check  . Breast cancer ([place]) 2002   bilateral- bilateral mastectomies done.  . Chronic kidney disease   . 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 CVA (cerebral infarction) 04/30/2013  . Dysrhythmia   . Encounter for antineoplastic chemotherapy 10/28/2016  . Family history of brain cancer   . Family history of breast cancer   . Family history of colon cancer 10/06/2009  . Family history of colon cancer   . Family history of prostate cancer   . GERD (gastroesophageal reflux disease)   . Glaucoma   . History of bilateral mastectomy 05/20/2010  . Hypertension   . Hypothyroidism   . Lung cancer ([place]) dx'd 12/2010   last Ct scan "no lung cancer" showing 11'16 CT Chest Epic.  . Malignant neoplasm of ascending colon ([place]) 01/29/2016  . Stroke [facility])    2 yrs ago-no residual  . Tubular adenoma of colon 07/2011   colon polyps ans reoccurence with malignancy found   Past Surgical History:  Procedure Laterality Date  . APPENDECTOMY    .  BOWEL DECOMPRESSION N/A 06/12/2018   Procedure: BOWEL DECOMPRESSION;  Surgeon: Rush Landmark Telford Nab., MD;  Location: [facility];  Service: Gastroenterology;  Laterality: N/A;  . cataracts Bilateral   . COLECTOMY WITH COLOSTOMY CREATION/HARTMANN PROCEDURE  06/16/2018   Procedure: CREATION/HARTMANN COLOSTOMY;  Surgeon: Michael Boston, MD;  Location: [facility];  Service: General;;  . COLONOSCOPY    . COLOSTOMY N/A 06/16/2018   Procedure: POSSIBLE COLOSTOMY;  Surgeon: Michael Boston, MD;  Location: [facility];  Service: General;  Laterality: N/A;  . EYE SURGERY    . FLEXIBLE SIGMOIDOSCOPY N/A 06/12/2018   Procedure: FLEXIBLE SIGMOIDOSCOPY;  Surgeon: Rush Landmark Telford Nab., MD;  Location: [facility];  Service: Gastroenterology;  Laterality: N/A;  . LAPAROSCOPIC SIGMOID COLECTOMY N/A 06/16/2018   Procedure: LAPAROSCOPIC LOW ANTERIOR RESECTION;  Surgeon: Michael Boston, MD;  Location: [facility];  Service: General;  Laterality: N/A;  . LUNG SURGERY     Resection -" not done. Pt . tx with Tarceva  . MASTECTOMY Bilateral   . MEDIASTINOSCOPY N/A 10/02/2016   Procedure: MEDIASTINOSCOPY;  Surgeon: [name], MD;  Location: [facility] OR;  Service: Thoracic;  Laterality: N/A;  . RECONSTRUCTION BREAST W/ TRAM FLAP Bilateral    bilaterally  . robotic right hemicolectomy Right 03/06/2016   Dr. Johney Maine  . TEE WITHOUT CARDIOVERSION N/A 06/04/2013   Procedure: TRANSESOPHAGEAL ECHOCARDIOGRAM (TEE);  Surgeon: Jolaine Artist, MD;  Location: [facility] ENDOSCOPY;  Service: Cardiovascular;  Laterality: N/A;  . TONSILLECTOMY    . TOTAL HIP ARTHROPLASTY Left 08/07/2016   Procedure: LEFT TOTAL HIP ARTHROPLASTY ANTERIOR APPROACH;  Surgeon: Gaynelle Arabian, MD;  Location: [facility];  Service: Orthopedics;  Laterality: Left;   Social History   Socioeconomic History  . Marital status: Married    Spouse name: Not on file  . Number of children: 2  . Years of education: college  . Highest education level: Not on file  Occupational  History  . Occupation: Retired    Fish farm manager: RETIRED  Tobacco Use  . Smoking status: Never Smoker  . Smokeless tobacco: Never Used  Vaping Use  . Vaping Use: Never used  Substance and Sexual Activity  . Alcohol use: Yes    Alcohol/week: 0.0 standard drinks    Comment: ocassional beer  . Drug use: No  . Sexual activity: Not Currently  Other Topics Concern  . Not on file  Social History Narrative   Patient lives at home with her husband. Patient drinks caffinated  Drinks daily.   Social Determinants of Health   Financial Resource Strain:   . Difficulty of Paying Living Expenses: Not on file  Food Insecurity:   . Worried About Charity fundraiser in the Last Year: Not on file  . Ran Out of Food in the Last Year: Not on file  Transportation Needs:   . Lack of Transportation (Medical): Not on file  . Lack of Transportation (Non-Medical): Not on file  Physical Activity:   . Days of Exercise per Week: Not on file  . Minutes of Exercise per Session: Not on file  Stress:   . Feeling of Stress : Not on file  Social Connections:   . Frequency of Communication with Friends and Family: Not on file  . Frequency of Social Gatherings with Friends and Family: Not on file  . Attends Religious Services: Not on file  . Active Member of Clubs or Organizations: Not on file  . Attends Archivist Meetings: Not on file  . Marital Status: Not on file   Family History  Problem Relation Age of Onset  . Lung cancer Father   . Heart disease Mother   . Clotting disorder Mother 62       coronary thrombosis  . Colon cancer Sister        dx in her 41s  . Breast cancer Sister 47  . Multiple myeloma Paternal Grandmother   . Brain cancer Paternal Uncle   . Cancer Paternal Aunt        unknown  . Colon cancer Maternal Aunt 80  . Prostate cancer Maternal Uncle        dx in his late 71s  . Other Maternal Uncle        brain tumor dx in his 31s  . Lung cancer Maternal Uncle   . Cancer  Maternal Uncle        NOS  . Stomach cancer Paternal Aunt   . Brain cancer Cousin        maternal first cousin dx in late 5s-50s  . Cancer Paternal Uncle        NOS  . Colon cancer Cousin        paternal cousin dx in his 99s  . Cancer Cousin        paternal cousin dx with NOS cancer   Allergies  Allergen Reactions  . Oxycodone Other (See Comments)    Not tolerate well  .  Doxycycline Nausea Only    Weak, stomach  . Sulfa Antibiotics Rash   Prior to Admission medications   Medication Sig Start Date End Date Taking? Authorizing Provider  acetaminophen (TYLENOL) 500 MG tablet Take 1,000 mg by mouth in the morning and at bedtime.   Yes [provider]  amiodarone (PACERONE) 200 MG tablet Take 1 tablet (200 mg total) by mouth daily. 04/25/20 08/30/20 Yes Verta Ellen., NP  brimonidine-timolol (COMBIGAN) 0.2-0.5 % ophthalmic solution Place 1 drop into the right eye every 12 (twelve) hours.   Yes [provider]  clopidogrel (PLAVIX) 75 MG tablet Take 75 mg by mouth daily. 06/25/16  Yes [provider]  cycloSPORINE (RESTASIS) 0.05 % ophthalmic emulsion Place 1 drop into both eyes 2 (two) times daily.   Yes [provider]  escitalopram (LEXAPRO) 10 MG tablet Take 10 mg by mouth daily.  05/20/13  Yes Philmore Pali, NP  famotidine (PEPCID) 20 MG tablet Take 40 mg by mouth 2 (two) times daily.   Yes [provider]  feeding supplement, ENSURE ENLIVE, (ENSURE ENLIVE) LIQD Take 237 mLs by mouth 2 (two) times daily between meals. 06/25/18  Yes Meuth, Brooke A, PA-C  FeFum-FePoly-FA-B Cmp-C-Biot (FOLIVANE-PLUS) CAPS TAKE 1 CAPSULE BY MOUTH EVERY DAY IN THE MORNING Patient taking differently: Take 1 capsule by mouth daily.  12/20/19  Yes Curt Bears, MD  furosemide (LASIX) 20 MG tablet Take 10 mg by mouth daily.    Yes [provider]  letrozole ([place]) 2.5 MG tablet TAKE 1 TABLET BY MOUTH EVERY DAY Patient taking differently: Take 2.5 mg by  mouth daily.  06/28/20  Yes Curt Bears, MD  Multiple Vitamins-Minerals (PRESERVISION AREDS 2) CAPS Take 1 capsule by mouth in the morning and at bedtime.    Yes [provider]  osimertinib mesylate (TAGRISSO) 80 MG tablet Take 1 tablet (80 mg total) by mouth daily. 05/18/20  Yes Curt Bears, MD  prochlorperazine (COMPAZINE) 10 MG tablet Take 1 tablet (10 mg total) by mouth every 6 (six) hours as needed for nausea or vomiting. 07/03/20  Yes Curt Bears, MD  rivaroxaban (XARELTO) 20 MG TABS tablet Take 20 mg by mouth daily with supper.   Yes [provider]  traMADol (ULTRAM) 50 MG tablet Take 1 tablet (50 mg total) by mouth every 12 (twelve) hours as needed. Patient taking differently: Take 50 mg by mouth every 12 (twelve) hours as needed for moderate pain.  07/25/20  Yes Curt Bears, MD  Multiple Vitamins-Iron (MULTIVITAMINS WITH IRON) TABS tablet Take 1 tablet by mouth daily. Patient not taking: Reported on 08/30/2020 06/25/18   Wellington Hampshire, PA-C   DG Chest 1 View  Result Date: 08/30/2020 CLINICAL DATA:  Fall EXAM: CHEST  1 VIEW COMPARISON:  June 12, 2020 FINDINGS: The heart size and mediastinal contours are mildly enlarged. Aortic knob calcifications are seen. A hiatal hernia is noted. There is a small left pleural effusion. Surgical sutures are seen within the left lung apex. There is a 2.5 cm ill-defined nodular opacity seen in the periphery of the right upper lung. No acute osseous abnormality. IMPRESSION: Small left pleural effusion Mild cardiomegaly. Ill-defined nodular opacity in the right upper lung which could be due to focal infectious etiology, however cannot exclude pulmonary nodule. If further evaluation is required would recommend CT chest. Electronically Signed   By: Prudencio Pair M.D.   On: 08/30/2020 18:32   MR XWRUE RIGHT WO CONTRAST  Result Date: 08/30/2020  CLINICAL DATA:  Femur fracture EXAM: MRI OF THE RIGHT FEMUR WITHOUT CONTRAST  TECHNIQUE: Multiplanar, multisequence MR imaging of the right femur was performed. No intravenous contrast was administered. COMPARISON:  None. FINDINGS: Bones/Joint/Cartilage There is a nondisplaced slightly impacted subcapital right femoral neck fracture. Surrounding marrow edema is noted. The femoral head is still well seated within the acetabulum. 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 There is diffuse labral degeneration seen throughout. Ligaments The ligamentum teres appears to be intact. Muscles and Tendons Increased signal seen within the adductor musculature. There is also mildly increased signal seen within the gluteus medius musculature. Increased signal seen at the insertion site of the gluteal tendons, however they are intact. The iliopsoas and hamstrings tendons intact. Soft tissues Mild soft tissue swelling seen around the lateral aspect of the hip. The visualized deep pelvis is grossly. IMPRESSION: There is a nondisplaced slightly impacted subcapital right femoral neck fracture with surrounding marrow edema. Adductor musculature edema. Electronically Signed   By: Prudencio Pair M.D.   On: 08/30/2020 22:28   DG Hip Unilat  With Pelvis 2-3 Views Right  Result Date: 08/30/2020 CLINICAL DATA:  79 year old female with fall and right hip pain. EXAM: DG HIP (WITH OR WITHOUT PELVIS) 2-3V RIGHT COMPARISON:  CT abdomen pelvis dated 06/12/2020. FINDINGS: There is a nondisplaced fracture of the right femoral neck with mild impaction. No dislocation. The bones are osteopenic. There is a total left hip arthroplasty. The arthroplasty components appear intact. Degenerative changes of the lower lumbar spine. The soft tissues are unremarkable. IMPRESSION: Nondisplaced fracture of the right femoral neck. Electronically Signed   By: Anner Crete M.D.   On: 08/30/2020 18:23    Positive ROS: All other systems have been reviewed and were otherwise  negative with the exception of those mentioned in the HPI and as above.  Physical Exam: General: Alert, no acute distress Cardiovascular: No pedal edema Respiratory: No cyanosis, no use of accessory musculature GI: No organomegaly, abdomen is soft and non-tender Skin: No lesions in the area of chief complaint Neurologic: Sensation intact distally Psychiatric: Patient is competent for consent with normal mood and affect Lymphatic: No axillary or cervical lymphadenopathy  MUSCULOSKELETAL: Right hip tender to palpation. Neurovascularly intact distally.  Assessment: Non-displaced fracture of the right femoral neck  Plan: Right hip fracture: Surgery with Dr. Lyla Glassing Friday afternoon. Hold Xarelto. NPO after midnight.  HTN, CKD, paroxysmal atrial fibrillation, adenocarcinoma of lung: Treat per hospitalist  Dorothyann Peng, [place] [phone]    08/31/2020 10:34 AM

## 2020-08-31 NOTE — Evaluation (Signed)
Clinical/Bedside Swallow Evaluation Patient Details  Name: Kristin [name] MRN: [id number] Date of Birth: 10-Aug-1941  Today's Date: 08/31/2020 Time: SLP Start Time (ACUTE ONLY): 1516 SLP Stop Time (ACUTE ONLY): 1540 SLP Time Calculation (min) (ACUTE ONLY): 24 min  Past Medical History:  Past Medical History:  Diagnosis Date  . Allergy   . Anemia   . Anemia of chronic disease 09/21/2016  . Anxiety   . Arthritis    arthritis- left hip  . Blood transfusion without reported diagnosis    transfusion- 3-4 yrs ago -found to be anemic on routine lab check  . Breast cancer ([place]) 2002   bilateral- bilateral mastectomies done.  . Chronic kidney disease   . 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 CVA (cerebral infarction) 04/30/2013  . Dysrhythmia   . Encounter for antineoplastic chemotherapy 10/28/2016  . Family history of brain cancer   . Family history of breast cancer   . Family history of colon cancer 10/06/2009  . Family history of colon cancer   . Family history of prostate cancer   . GERD (gastroesophageal reflux disease)   . Glaucoma   . History of bilateral mastectomy 05/20/2010  . Hypertension   . Hypothyroidism   . Lung cancer ([place]) dx'd 12/2010   last Ct scan "no lung cancer" showing 11'16 CT Chest Epic.  . Malignant neoplasm of ascending colon ([place]) 01/29/2016  . Stroke [facility])    2 yrs ago-no residual  . Tubular adenoma of colon 07/2011   colon polyps ans reoccurence with malignancy found   Past Surgical History:  Past Surgical History:  Procedure Laterality Date  . APPENDECTOMY    . BOWEL DECOMPRESSION N/A 06/12/2018   Procedure: BOWEL DECOMPRESSION;  Surgeon: Rush Landmark Telford Nab., MD;  Location: [facility];  Service: Gastroenterology;  Laterality: N/A;  . cataracts Bilateral   . COLECTOMY WITH COLOSTOMY CREATION/HARTMANN PROCEDURE  06/16/2018   Procedure: CREATION/HARTMANN COLOSTOMY;  Surgeon: Michael Boston, MD;  Location: [facility];  Service: General;;  . COLONOSCOPY    .  COLOSTOMY N/A 06/16/2018   Procedure: POSSIBLE COLOSTOMY;  Surgeon: Michael Boston, MD;  Location: [facility];  Service: General;  Laterality: N/A;  . EYE SURGERY    . FLEXIBLE SIGMOIDOSCOPY N/A 06/12/2018   Procedure: FLEXIBLE SIGMOIDOSCOPY;  Surgeon: Rush Landmark Telford Nab., MD;  Location: [facility];  Service: Gastroenterology;  Laterality: N/A;  . LAPAROSCOPIC SIGMOID COLECTOMY N/A 06/16/2018   Procedure: LAPAROSCOPIC LOW ANTERIOR RESECTION;  Surgeon: Michael Boston, MD;  Location: [facility];  Service: General;  Laterality: N/A;  . LUNG SURGERY     Resection -" not done. Pt . tx with Tarceva  . MASTECTOMY Bilateral   . MEDIASTINOSCOPY N/A 10/02/2016   Procedure: MEDIASTINOSCOPY;  Surgeon: [name], MD;  Location: [facility] OR;  Service: Thoracic;  Laterality: N/A;  . RECONSTRUCTION BREAST W/ TRAM FLAP Bilateral    bilaterally  . robotic right hemicolectomy Right 03/06/2016   Dr. Johney Maine  . TEE WITHOUT CARDIOVERSION N/A 06/04/2013   Procedure: TRANSESOPHAGEAL ECHOCARDIOGRAM (TEE);  Surgeon: Jolaine Artist, MD;  Location: [facility] ENDOSCOPY;  Service: Cardiovascular;  Laterality: N/A;  . TONSILLECTOMY    . TOTAL HIP ARTHROPLASTY Left 08/07/2016   Procedure: LEFT TOTAL HIP ARTHROPLASTY ANTERIOR APPROACH;  Surgeon: Gaynelle Arabian, MD;  Location: [facility];  Service: Orthopedics;  Laterality: Left;   HPI:  Kristin [name] is a 79 y.o. female with medical history significant for adenocarcinoma of lungs, history of breast cancer and colon cancer -followed by oncology, paroxysmal atrial fibrillation, hypertension,  CKD stage III, anemia of chronic disease, PE and DVT in spring 2021.  Ms. Hairston was at home sitting on her deck when she got up to go inside.  She reports having some dizziness after she stood up to walk back inside.  She reports is not uncommon that she has dizziness occasionally.  Tonight she was walking inside dizziness was profound and she felt weak and fell to the ground.  Husband was by her and  kept her from hitting her head.  She denies any loss of consciousness.  She did land on her right side and reported pain in her right hip and was able to stand up after the fall.  EMS was called she was brought to the emergency room where she was found to have a right femoral neck fracture   Assessment / Plan / Recommendation Clinical Impression  Pt presenting with a supsected component of pharyngoesophageal dysphagia. Pt reports episodic difficulty with swallowing including globus sensation with larger PO medicines & breads as well as intermittent coughing with thin liquids. She denies hx of PNA. Clinically pt with reduced laryngeal elevation per palpation as well as suspected delay in swallow initiation. Pt with intermittent throat clear with thin liquids. Vocal quality remained clear. Recommend continue regular diet and thin liquids with medicines in puree (crush larger as needed) and safe swallowing precautions. SLP to follow up for diet tolerance.   SLP Visit Diagnosis: Dysphagia, unspecified (R13.10)    Aspiration Risk  Mild aspiration risk    Diet Recommendation   Regular, thin liquids  Medication Administration: Whole meds with puree    Other  Recommendations Oral Care Recommendations: Oral care BID   Follow up Recommendations 24 hour supervision/assistance      Frequency and Duration min 1 x/week  1 week       Prognosis Prognosis for Safe Diet Advancement: Good      Swallow Study   General Date of Onset: 08/30/20 HPI: Kristin [name] is a 79 y.o. female with medical history significant for adenocarcinoma of lungs, history of breast cancer and colon cancer -followed by oncology, paroxysmal atrial fibrillation, hypertension, CKD stage III, anemia of chronic disease, PE and DVT in spring 2021.  [name] was at home sitting on her deck when she got up to go inside.  She reports having some dizziness after she stood up to walk back inside.  She reports is not uncommon that she  has dizziness occasionally.  Tonight she was walking inside dizziness was profound and she felt weak and fell to the ground.  Husband was by her and kept her from hitting her head.  She denies any loss of consciousness.  She did land on her right side and reported pain in her right hip and was able to stand up after the fall.  EMS was called she was brought to the emergency room where she was found to have a right femoral neck fracture Type of Study: Bedside Swallow Evaluation Previous Swallow Assessment: none on file Diet Prior to this Study: Regular;Thin liquids Temperature Spikes Noted: No Respiratory Status: Room air History of Recent Intubation: No Behavior/Cognition: Alert;Cooperative;Pleasant mood Oral Cavity Assessment: Within Functional Limits Oral Care Completed by SLP: No Oral Cavity - Dentition: Adequate natural dentition Vision: Functional for self-feeding Self-Feeding Abilities: Able to feed self Patient Positioning: Upright in bed Baseline Vocal Quality: Low vocal intensity Volitional Swallow: Able to elicit    Oral/Motor/Sensory Function Overall Oral Motor/Sensory Function: Generalized oral weakness   Ice Chips  Ice chips: Impaired Presentation: Spoon Oral Phase Functional Implications: Prolonged oral transit Pharyngeal Phase Impairments: Suspected delayed Swallow;Decreased hyoid-laryngeal movement;Multiple swallows   Thin Liquid Thin Liquid: Impaired Presentation: Cup;Straw Pharyngeal  Phase Impairments: Suspected delayed Swallow;Multiple swallows;Throat Clearing - Delayed;Throat Clearing - Immediate;Decreased hyoid-laryngeal movement    Nectar Thick Nectar Thick Liquid: Not tested   Honey Thick Honey Thick Liquid: Not tested   Puree Puree: Impaired Presentation: Self Fed Pharyngeal Phase Impairments: Suspected delayed Swallow;Multiple swallows;Decreased hyoid-laryngeal movement   Solid     Solid: Impaired Presentation: Self Fed Oral Phase Functional Implications:  Prolonged oral transit Pharyngeal Phase Impairments: Suspected delayed Swallow;Decreased hyoid-laryngeal movement;Multiple swallows      Raeonna Milo E Liany Mumpower MA, CCC-SLP Acute Rehabilitation Services 08/31/2020,3:52 PM

## 2020-08-31 NOTE — Consult Note (Signed)
ORTHOPAEDIC CONSULTATION  REQUESTING PHYSICIAN: Caren Griffins, MD  PCP:  Eber Hong, MD   Chief Complaint: Right hip pain  HPI: Kristin [name] is a 79 y.o. female who complains of right hip pain secondary to a fall. The patient has past medical history significant for adenocarcinoma of lungs, history of breast cancer and colon cancer followed by oncology, paroxysmal atrial fibrillation, hypertension, CKD stage III, anemia of chronic disease, and PE and DVT in spring 2021. The patient was walking inside when she felt dizzy and weak. She fell and experienced immediate pain in her right hip. EMS brought her to the emergency department where she was found to have a right femoral neck fracture.   Past Medical History:  Diagnosis Date   Allergy    Anemia    Anemia of chronic disease 09/21/2016   Anxiety    Arthritis    arthritis- left hip   Blood transfusion without reported diagnosis    transfusion- 3-4 yrs ago -found to be anemic on routine lab check   Breast cancer [facility]) 2002   bilateral- bilateral mastectomies done.   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   Malignant neoplasm of ascending colon ([place]) 01/29/2016   Stroke ([place])    2 yrs ago-no residual   Tubular adenoma of colon 07/2011   colon polyps ans reoccurence with malignancy found   Past Surgical History:  Procedure Laterality Date   APPENDECTOMY      BOWEL DECOMPRESSION N/A 06/12/2018   Procedure: BOWEL DECOMPRESSION;  Surgeon: Irving Copas., MD;  Location: [facility];  Service: Gastroenterology;  Laterality: N/A;   cataracts Bilateral    COLECTOMY WITH COLOSTOMY CREATION/HARTMANN PROCEDURE  06/16/2018   Procedure: CREATION/HARTMANN COLOSTOMY;  Surgeon: Michael Boston, MD;  Location: [facility];  Service: General;;   COLONOSCOPY     COLOSTOMY N/A 06/16/2018   Procedure: POSSIBLE COLOSTOMY;  Surgeon: Michael Boston, MD;  Location: [facility];  Service: General;  Laterality: N/A;   EYE SURGERY     FLEXIBLE SIGMOIDOSCOPY N/A 06/12/2018   Procedure: FLEXIBLE SIGMOIDOSCOPY;  Surgeon: Irving Copas., MD;  Location: [facility];  Service: Gastroenterology;  Laterality: N/A;   LAPAROSCOPIC SIGMOID COLECTOMY N/A 06/16/2018   Procedure: LAPAROSCOPIC LOW ANTERIOR RESECTION;  Surgeon: Michael Boston, MD;  Location: [facility];  Service: General;  Laterality: N/A;   LUNG SURGERY     Resection -" not done. Pt . tx with Tarceva   MASTECTOMY Bilateral    MEDIASTINOSCOPY N/A 10/02/2016   Procedure: MEDIASTINOSCOPY;  Surgeon: [name], MD;  Location: [facility] OR;  Service: Thoracic;  Laterality: N/A;   RECONSTRUCTION BREAST W/ TRAM FLAP Bilateral    bilaterally   robotic right hemicolectomy Right 03/06/2016   Dr. Johney Maine   TEE WITHOUT CARDIOVERSION N/A 06/04/2013   Procedure: TRANSESOPHAGEAL ECHOCARDIOGRAM (TEE);  Surgeon: Jolaine Artist, MD;  Location: [facility] ENDOSCOPY;  Service: Cardiovascular;  Laterality: N/A;   TONSILLECTOMY     TOTAL HIP ARTHROPLASTY Left 08/07/2016   Procedure: LEFT TOTAL HIP ARTHROPLASTY ANTERIOR APPROACH;  Surgeon: Gaynelle Arabian, MD;  Location: [facility];  Service: Orthopedics;  Laterality: Left;   Social History   Socioeconomic History   Marital status: Married    Spouse name: Not on file   Number of children: 2   Years of education: college   Highest education level: Not on file  Occupational  History   Occupation: Retired    Fish farm manager: RETIRED  Tobacco Use   Smoking status: Never Smoker   Smokeless tobacco: Never Used  Scientific laboratory technician Use: Never used  Substance and Sexual Activity   Alcohol use: Yes    Alcohol/week: 0.0 standard drinks    Comment: ocassional beer   Drug use: No   Sexual activity: Not Currently  Other Topics Concern   Not on file  Social History Narrative   Patient lives at home with her husband. Patient drinks caffinated  Drinks daily.   Social Determinants of Health   Financial Resource Strain:    Difficulty of Paying Living Expenses: Not on file  Food Insecurity:    Worried About Charity fundraiser in the Last Year: Not on file   [employer] of Food in the Last Year: Not on file  Transportation Needs:    Lack of Transportation (Medical): Not on file   Lack of Transportation (Non-Medical): Not on file  Physical Activity:    Days of Exercise per Week: Not on file   Minutes of Exercise per Session: Not on file  Stress:    Feeling of Stress : Not on file  Social Connections:    Frequency of Communication with Friends and Family: Not on file   Frequency of Social Gatherings with Friends and Family: Not on file   Attends Religious Services: Not on file   Active Member of Clubs or Organizations: Not on file   Attends Archivist Meetings: Not on file   Marital Status: Not on file   Family History  Problem Relation Age of Onset   Lung cancer Father    Heart disease Mother    Clotting disorder Mother 87       coronary thrombosis   Colon cancer Sister        dx in her 8s   Breast cancer Sister 53   Multiple myeloma Paternal Grandmother    Brain cancer Paternal Uncle    Cancer Paternal Aunt        unknown   Colon cancer Maternal Aunt 80   Prostate cancer Maternal Uncle        dx in his late 51s   Other Maternal Uncle        brain tumor dx in his 55s   Lung cancer Maternal Uncle    Cancer  Maternal Uncle        NOS   Stomach cancer Paternal Aunt    Brain cancer Cousin        maternal first cousin dx in late 31s-50s   Cancer Paternal Uncle        NOS   Colon cancer Cousin        paternal cousin dx in his 32s   Cancer Cousin        paternal cousin dx with NOS cancer   Allergies  Allergen Reactions   Oxycodone Other (See Comments)    Not tolerate well  Doxycycline Nausea Only    Weak, stomach   Sulfa Antibiotics Rash   Prior to Admission medications   Medication Sig Start Date End Date Taking? Authorizing Provider  acetaminophen (TYLENOL) 500 MG tablet Take 1,000 mg by mouth in the morning and at bedtime.   Yes [provider]  amiodarone (PACERONE) 200 MG tablet Take 1 tablet (200 mg total) by mouth daily. 04/25/20 08/30/20 Yes Verta Ellen., NP  brimonidine-timolol (COMBIGAN) 0.2-0.5 % ophthalmic solution Place 1 drop into the right eye every 12 (twelve) hours.   Yes [provider]  clopidogrel (PLAVIX) 75 MG tablet Take 75 mg by mouth daily. 06/25/16  Yes [provider]  cycloSPORINE (RESTASIS) 0.05 % ophthalmic emulsion Place 1 drop into both eyes 2 (two) times daily.   Yes [provider]  escitalopram (LEXAPRO) 10 MG tablet Take 10 mg by mouth daily.  05/20/13  Yes Philmore Pali, NP  famotidine (PEPCID) 20 MG tablet Take 40 mg by mouth 2 (two) times daily.   Yes [provider]  feeding supplement, ENSURE ENLIVE, (ENSURE ENLIVE) LIQD Take 237 mLs by mouth 2 (two) times daily between meals. 06/25/18  Yes Meuth, Brooke A, PA-C  FeFum-FePoly-FA-B Cmp-C-Biot (FOLIVANE-PLUS) CAPS TAKE 1 CAPSULE BY MOUTH EVERY DAY IN THE MORNING Patient taking differently: Take 1 capsule by mouth daily.  12/20/19  Yes Curt Bears, MD  furosemide (LASIX) 20 MG tablet Take 10 mg by mouth daily.    Yes [provider]  letrozole ([place]) 2.5 MG tablet TAKE 1 TABLET BY MOUTH EVERY DAY Patient taking differently: Take 2.5 mg by  mouth daily.  06/28/20  Yes Curt Bears, MD  Multiple Vitamins-Minerals (PRESERVISION AREDS 2) CAPS Take 1 capsule by mouth in the morning and at bedtime.    Yes [provider]  osimertinib mesylate (TAGRISSO) 80 MG tablet Take 1 tablet (80 mg total) by mouth daily. 05/18/20  Yes Curt Bears, MD  prochlorperazine (COMPAZINE) 10 MG tablet Take 1 tablet (10 mg total) by mouth every 6 (six) hours as needed for nausea or vomiting. 07/03/20  Yes Curt Bears, MD  rivaroxaban (XARELTO) 20 MG TABS tablet Take 20 mg by mouth daily with supper.   Yes [provider]  traMADol (ULTRAM) 50 MG tablet Take 1 tablet (50 mg total) by mouth every 12 (twelve) hours as needed. Patient taking differently: Take 50 mg by mouth every 12 (twelve) hours as needed for moderate pain.  07/25/20  Yes Curt Bears, MD  Multiple Vitamins-Iron (MULTIVITAMINS WITH IRON) TABS tablet Take 1 tablet by mouth daily. Patient not taking: Reported on 08/30/2020 06/25/18   Wellington Hampshire, PA-C   DG Chest 1 View  Result Date: 08/30/2020 CLINICAL DATA:  Fall EXAM: CHEST  1 VIEW COMPARISON:  June 12, 2020 FINDINGS: The heart size and mediastinal contours are mildly enlarged. Aortic knob calcifications are seen. A hiatal hernia is noted. There is a small left pleural effusion. Surgical sutures are seen within the left lung apex. There is a 2.5 cm ill-defined nodular opacity seen in the periphery of the right upper lung. No acute osseous abnormality. IMPRESSION: Small left pleural effusion Mild cardiomegaly. Ill-defined nodular opacity in the right upper lung which could be due to focal infectious etiology, however cannot exclude pulmonary nodule. If further evaluation is required would recommend CT chest. Electronically Signed   By: Prudencio Pair M.D.   On: 08/30/2020 18:32   MR WNIOE RIGHT WO CONTRAST  Result Date: 08/30/2020  CLINICAL DATA:  Femur fracture EXAM: MRI OF THE RIGHT FEMUR WITHOUT CONTRAST  TECHNIQUE: Multiplanar, multisequence MR imaging of the right femur was performed. No intravenous contrast was administered. COMPARISON:  None. FINDINGS: Bones/Joint/Cartilage There is a nondisplaced slightly impacted subcapital right femoral neck fracture. Surrounding marrow edema is noted. The femoral head is still well seated within the acetabulum. 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 There is diffuse labral degeneration seen throughout. Ligaments The ligamentum teres appears to be intact. Muscles and Tendons Increased signal seen within the adductor musculature. There is also mildly increased signal seen within the gluteus medius musculature. Increased signal seen at the insertion site of the gluteal tendons, however they are intact. The iliopsoas and hamstrings tendons intact. Soft tissues Mild soft tissue swelling seen around the lateral aspect of the hip. The visualized deep pelvis is grossly. IMPRESSION: There is a nondisplaced slightly impacted subcapital right femoral neck fracture with surrounding marrow edema. Adductor musculature edema. Electronically Signed   By: Prudencio Pair M.D.   On: 08/30/2020 22:28   DG Hip Unilat  With Pelvis 2-3 Views Right  Result Date: 08/30/2020 CLINICAL DATA:  79 year old female with fall and right hip pain. EXAM: DG HIP (WITH OR WITHOUT PELVIS) 2-3V RIGHT COMPARISON:  CT abdomen pelvis dated 06/12/2020. FINDINGS: There is a nondisplaced fracture of the right femoral neck with mild impaction. No dislocation. The bones are osteopenic. There is a total left hip arthroplasty. The arthroplasty components appear intact. Degenerative changes of the lower lumbar spine. The soft tissues are unremarkable. IMPRESSION: Nondisplaced fracture of the right femoral neck. Electronically Signed   By: Anner Crete M.D.   On: 08/30/2020 18:23    Positive ROS: All other systems have been reviewed and were otherwise  negative with the exception of those mentioned in the HPI and as above.  Physical Exam: General: Alert, no acute distress Cardiovascular: No pedal edema Respiratory: No cyanosis, no use of accessory musculature GI: No organomegaly, abdomen is soft and non-tender Skin: No lesions in the area of chief complaint Neurologic: Sensation intact distally Psychiatric: Patient is competent for consent with normal mood and affect Lymphatic: No axillary or cervical lymphadenopathy  MUSCULOSKELETAL: Right hip tender to palpation. Neurovascularly intact distally.  Assessment: Non-displaced fracture of the right femoral neck  Plan: Right hip fracture: Surgery with Dr. Lyla Glassing Friday afternoon. Hold Xarelto. NPO after midnight.  HTN, CKD, paroxysmal atrial fibrillation, adenocarcinoma of lung: Treat per hospitalist  Dorothyann Peng, [place] [phone]    08/31/2020 10:34 AM

## 2020-08-31 NOTE — Progress Notes (Signed)
PROGRESS NOTE  Kristin [name] XLK:[id number] DOB: 01-25-1941 DOA: 08/30/2020 PCP: Eber Hong, MD   LOS: 1 day   Brief Narrative / Interim history: 79 year old female with lung adenocarcinoma, history of breast and colon cancer, followed by oncology, paroxysmal A. fib on Xarelto, HTN, anemia of chronic disease, chronic kidney disease stage III, PE and DVT comes into the hospital after having a fall at home.  She stood up, had some dizziness. Imaging in the ER shows closed displaced fracture of the right femoral neck.  Orthopedic surgery consulted and patient was admitted to the hospitalist service.  Subjective / 24h Interval events: She is doing well this morning, no pain unless she is moving.  No chest discomfort or shortness of breath.  Assessment & Plan: Principal Problem Closed displaced fracture of the right femoral neck -Orthopedic surgery consulted, plan for surgical repair tomorrow, to be 48 hours of Xarelto -Continue pain control, postoperative physical therapy evaluation  Active Problems Essential hypertension -Continue furosemide, blood pressure stable  Paroxysmal A. Fib -Continue amiodarone, hold Xarelto  History of DVT/PE -Hold Xarelto, resume postop when cleared by orthopedic surgery  Adenocarcinoma of the lung, personal history of breast cancer -Outpatient management  Chronic kidney disease stage IIIb -Baseline creatinine 1.2, currently at baseline  Scheduled Meds: . amiodarone  200 mg Oral Daily  . brimonidine  1 drop Right Eye BID   And  . timolol  1 drop Right Eye BID  . cycloSPORINE  1 drop Both Eyes BID  . escitalopram  10 mg Oral Daily  . famotidine  40 mg Oral BID  . furosemide  10 mg Oral Daily  . influenza vaccine adjuvanted  0.5 mL Intramuscular Tomorrow-1000  . letrozole  2.5 mg Oral Daily  . osimertinib mesylate  80 mg Oral Daily   Continuous Infusions: . sodium chloride    . lactated ringers 50 mL/hr at 08/31/20 0048   PRN  Meds:.acetaminophen, HYDROmorphone (DILAUDID) injection, senna-docusate, traMADol  Diet Orders (From admission, onward)    Start     Ordered   08/30/20 2127  Diet Heart Room service appropriate? Yes; Fluid consistency: Thin  Diet effective now       Question Answer Comment  Room service appropriate? Yes   Fluid consistency: Thin      08/30/20 2126          DVT prophylaxis: SCDs Start: 08/30/20 2127     Code Status: Full Code  Family Communication: husband at bedside   Status is: Inpatient  Remains inpatient appropriate because:Ongoing diagnostic testing needed not appropriate for outpatient work up and Inpatient level of care appropriate due to severity of illness   Dispo: The patient is from: Home              Anticipated d/c is to: SNF              Anticipated d/c date is: 3 days              Patient currently is not medically stable to d/c.  Consultants:  Orthopedic surgery   Procedures:  None   Microbiology  None   Antimicrobials: None     Objective: Vitals:   08/31/20 0730 08/31/20 0839 08/31/20 0945 08/31/20 1055  BP: (!) 123/52 (!) 119/56 (!) 125/52 (!) 125/56  Pulse: 60 60 (!) 58 60  Resp: 19 15 19  (!) 21  Temp:      TempSrc:      SpO2: 99% 99% 100% 100%   No intake  or output data in the 24 hours ending 08/31/20 1121 There were no vitals filed for this visit.  Examination:  Constitutional: NAD Eyes: no scleral icterus ENMT: Mucous membranes are moist.  Neck: normal, supple Respiratory: clear to auscultation bilaterally, no wheezing, no crackles.  Cardiovascular: Regular rate and rhythm, no murmurs / rubs / gallops. No LE edema.  Abdomen: non distended, no tenderness. Bowel sounds positive.  Musculoskeletal: no clubbing / cyanosis.  Skin: no rashes Neurologic: CN 2-12 grossly intact. Strength 5/5 in all 4.    Data Reviewed: I have independently reviewed following labs and imaging studies   CBC: Recent Labs  Lab 08/30/20 1700  08/31/20 0625  WBC 11.9* 8.7  HGB 11.7* 9.1*  HCT 36.1 28.8*  MCV 97.6 98.3  PLT 268 283   Basic Metabolic Panel: Recent Labs  Lab 08/30/20 1700  NA 136  K 4.2  CL 102  CO2 25  GLUCOSE 117*  BUN 21  CREATININE 1.19*  CALCIUM 9.8   Liver Function Tests: No results for input(s): AST, ALT, ALKPHOS, BILITOT, PROT, ALBUMIN in the last 168 hours. Coagulation Profile: No results for input(s): INR, PROTIME in the last 168 hours. HbA1C: No results for input(s): HGBA1C in the last 72 hours. CBG: Recent Labs  Lab 08/30/20 1617  GLUCAP 108*    Recent Results (from the past 240 hour(s))  Respiratory Panel by RT PCR (Flu A&B, Covid) - Nasopharyngeal Swab     Status: None   Collection Time: 08/30/20  6:46 PM   Specimen: Nasopharyngeal Swab  Result Value Ref Range Status   SARS Coronavirus 2 by RT PCR NEGATIVE NEGATIVE Final    Comment: (NOTE) SARS-CoV-2 target nucleic acids are NOT DETECTED.  The SARS-CoV-2 RNA is generally detectable in upper respiratoy specimens during the acute phase of infection. The lowest concentration of SARS-CoV-2 viral copies this assay can detect is 131 copies/mL. A negative result does not preclude SARS-Cov-2 infection and should not be used as the sole basis for treatment or other patient management decisions. A negative result may occur with  improper specimen collection/handling, submission of specimen other than nasopharyngeal swab, presence of viral mutation(s) within the areas targeted by this assay, and inadequate number of viral copies (<131 copies/mL). A negative result must be combined with clinical observations, patient history, and epidemiological information. The expected result is Negative.  Fact Sheet for Patients:  PinkCheek.be  Fact Sheet for Healthcare Providers:  GravelBags.it  This test is no t yet approved or cleared by the [place] FDA and  has been  authorized for detection and/or diagnosis of SARS-CoV-2 by FDA under an Emergency Use Authorization (EUA). This EUA will remain  in effect (meaning this test can be used) for the duration of the COVID-19 declaration under Section 564(b)(1) of the Act, 21 U.S.C. section 360bbb-3(b)(1), unless the authorization is terminated or revoked sooner.     Influenza A by PCR NEGATIVE NEGATIVE Final   Influenza B by PCR NEGATIVE NEGATIVE Final    Comment: (NOTE) The Xpert Xpress SARS-CoV-2/FLU/RSV assay is intended as an aid in  the diagnosis of influenza from Nasopharyngeal swab specimens and  should not be used as a sole basis for treatment. Nasal washings and  aspirates are unacceptable for Xpert Xpress SARS-CoV-2/FLU/RSV  testing.  Fact Sheet for Patients: PinkCheek.be  Fact Sheet for Healthcare Providers: GravelBags.it  This test is not yet approved or cleared by the [place] FDA and  has been authorized for detection and/or diagnosis of SARS-CoV-2  by  FDA under an Emergency Use Authorization (EUA). This EUA will remain  in effect (meaning this test can be used) for the duration of the  Covid-19 declaration under Section 564(b)(1) of the Act, 21  U.S.C. section 360bbb-3(b)(1), unless the authorization is  terminated or revoked. Performed at [facility], [place] [address] [place], [place] [address]      Radiology Studies: DG Chest 1 View  Result Date: 08/30/2020 CLINICAL DATA:  Fall EXAM: CHEST  1 VIEW COMPARISON:  June 12, 2020 FINDINGS: The heart size and mediastinal contours are mildly enlarged. Aortic knob calcifications are seen. A hiatal hernia is noted. There is a small left pleural effusion. Surgical sutures are seen within the left lung apex. There is a 2.5 cm ill-defined nodular opacity seen in the periphery of the right upper lung. No acute osseous abnormality. IMPRESSION: Small left pleural  effusion Mild cardiomegaly. Ill-defined nodular opacity in the right upper lung which could be due to focal infectious etiology, however cannot exclude pulmonary nodule. If further evaluation is required would recommend CT chest. Electronically Signed   By: Prudencio Pair M.D.   On: 08/30/2020 18:32   MR EXHBZ RIGHT WO CONTRAST  Result Date: 08/30/2020 CLINICAL DATA:  Femur fracture EXAM: MRI OF THE RIGHT FEMUR WITHOUT CONTRAST TECHNIQUE: Multiplanar, multisequence MR imaging of the right femur was performed. No intravenous contrast was administered. COMPARISON:  None. FINDINGS: Bones/Joint/Cartilage There is a nondisplaced slightly impacted subcapital right femoral neck fracture. Surrounding marrow edema is noted. The femoral head is still well seated within the acetabulum. 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 There is diffuse labral degeneration seen throughout. Ligaments The ligamentum teres appears to be intact. Muscles and Tendons Increased signal seen within the adductor musculature. There is also mildly increased signal seen within the gluteus medius musculature. Increased signal seen at the insertion site of the gluteal tendons, however they are intact. The iliopsoas and hamstrings tendons intact. Soft tissues Mild soft tissue swelling seen around the lateral aspect of the hip. The visualized deep pelvis is grossly. IMPRESSION: There is a nondisplaced slightly impacted subcapital right femoral neck fracture with surrounding marrow edema. Adductor musculature edema. Electronically Signed   By: Prudencio Pair M.D.   On: 08/30/2020 22:28   DG Hip Unilat  With Pelvis 2-3 Views Right  Result Date: 08/30/2020 CLINICAL DATA:  79 year old female with fall and right hip pain. EXAM: DG HIP (WITH OR WITHOUT PELVIS) 2-3V RIGHT COMPARISON:  CT abdomen pelvis dated 06/12/2020. FINDINGS: There is a nondisplaced fracture of the right femoral neck with mild  impaction. No dislocation. The bones are osteopenic. There is a total left hip arthroplasty. The arthroplasty components appear intact. Degenerative changes of the lower lumbar spine. The soft tissues are unremarkable. IMPRESSION: Nondisplaced fracture of the right femoral neck. Electronically Signed   By: Anner Crete M.D.   On: 08/30/2020 18:23    Marzetta Board, MD, PhD [facility]  Between 7 am - 7 pm I am available, please contact me via Amion or Securechat  Between 7 pm - 7 am I am not available, please contact night coverage MD/APP via Amion

## 2020-09-01 ENCOUNTER — Inpatient Hospital Stay (HOSPITAL_COMMUNITY): Payer: Medicare Other | Admitting: Certified Registered Nurse Anesthetist

## 2020-09-01 ENCOUNTER — Encounter (HOSPITAL_COMMUNITY): Payer: Self-pay | Admitting: Family Medicine

## 2020-09-01 ENCOUNTER — Telehealth: Payer: Self-pay

## 2020-09-01 ENCOUNTER — Inpatient Hospital Stay: Payer: Medicare Other | Attending: Internal Medicine

## 2020-09-01 ENCOUNTER — Inpatient Hospital Stay (HOSPITAL_COMMUNITY): Payer: Medicare Other

## 2020-09-01 ENCOUNTER — Encounter (HOSPITAL_COMMUNITY)
Admission: EM | Disposition: A | Discharge status: Home Health | Payer: Self-pay | Source: Home / Self Care | Attending: Internal Medicine

## 2020-09-01 ENCOUNTER — Ambulatory Visit (HOSPITAL_COMMUNITY): Payer: BLUE CROSS/BLUE SHIELD

## 2020-09-01 DIAGNOSIS — C3491 Malignant neoplasm of unspecified part of right bronchus or lung: Secondary | ICD-10-CM | POA: Diagnosis not present

## 2020-09-01 DIAGNOSIS — N1832 Chronic kidney disease, stage 3b: Secondary | ICD-10-CM | POA: Diagnosis not present

## 2020-09-01 DIAGNOSIS — S72001A Fracture of unspecified part of neck of right femur, initial encounter for closed fracture: Secondary | ICD-10-CM | POA: Diagnosis not present

## 2020-09-01 HISTORY — PX: TOTAL HIP ARTHROPLASTY: SHX124

## 2020-09-01 LAB — COMPREHENSIVE METABOLIC PANEL
ALT: 15 U/L (ref 0–44)
AST: 14 U/L — ABNORMAL LOW (ref 15–41)
Albumin: 3.1 g/dL — ABNORMAL LOW (ref 3.5–5.0)
Alkaline Phosphatase: 78 U/L (ref 38–126)
Anion gap: 10 (ref 5–15)
BUN: 25 mg/dL — ABNORMAL HIGH (ref 8–23)
CO2: 26 mmol/L (ref 22–32)
Calcium: 9.8 mg/dL (ref 8.9–10.3)
Chloride: 101 mmol/L (ref 98–111)
Creatinine, Ser: 1.06 mg/dL — ABNORMAL HIGH (ref 0.44–1.00)
GFR, Estimated: 53 mL/min — ABNORMAL LOW (ref >60)
Glucose, Bld: 118 mg/dL — ABNORMAL HIGH (ref 70–99)
Potassium: 4.4 mmol/L (ref 3.5–5.1)
Sodium: 137 mmol/L (ref 135–145)
Total Bilirubin: 0.7 mg/dL (ref 0.3–1.2)
Total Protein: 5.9 g/dL — ABNORMAL LOW (ref 6.5–8.1)

## 2020-09-01 LAB — CBC
HCT: 31.2 % — ABNORMAL LOW (ref 36.0–46.0)
Hemoglobin: 10 g/dL — ABNORMAL LOW (ref 12.0–15.0)
MCH: 31.4 pg (ref 26.0–34.0)
MCHC: 32.1 g/dL (ref 30.0–36.0)
MCV: 98.1 fL (ref 80.0–100.0)
Platelets: 231 10*3/uL (ref 150–400)
RBC: 3.18 MIL/uL — ABNORMAL LOW (ref 3.87–5.11)
RDW: 13.8 % (ref 11.5–15.5)
WBC: 9.8 10*3/uL (ref 4.0–10.5)
nRBC: 0 % (ref 0.0–0.2)

## 2020-09-01 LAB — TYPE AND SCREEN
ABO/RH(D): B POS
Antibody Screen: NEGATIVE

## 2020-09-01 SURGERY — ARTHROPLASTY, HIP, TOTAL, ANTERIOR APPROACH
Anesthesia: General | Site: Hip | Laterality: Right

## 2020-09-01 MED ORDER — CHLORHEXIDINE GLUCONATE 4 % EX LIQD: 60.0000 mL | Freq: Once | CUTANEOUS | Status: DC

## 2020-09-01 MED ORDER — POVIDONE-IODINE 10 % EX SWAB: 2.0000 "application " | Freq: Once | CUTANEOUS | Status: DC

## 2020-09-01 MED ORDER — ONDANSETRON HCL 4 MG/2ML IJ SOLN
INTRAMUSCULAR | Status: DC | PRN
  Administered 2020-09-01: 4 mg via INTRAVENOUS

## 2020-09-01 MED ORDER — PROPOFOL 10 MG/ML IV BOLUS
INTRAVENOUS | Status: DC | PRN
  Administered 2020-09-01: 50 mg via INTRAVENOUS
  Administered 2020-09-01: 40 mg via INTRAVENOUS

## 2020-09-01 MED ORDER — FENTANYL CITRATE (PF) 100 MCG/2ML IJ SOLN: 25.0000 ug | INTRAMUSCULAR | Status: DC | PRN

## 2020-09-01 MED ORDER — ROCURONIUM BROMIDE 10 MG/ML (PF) SYRINGE
PREFILLED_SYRINGE | INTRAVENOUS | Status: AC
  Filled 2020-09-01: qty 10

## 2020-09-01 MED ORDER — DEXAMETHASONE SODIUM PHOSPHATE 10 MG/ML IJ SOLN
INTRAMUSCULAR | Status: DC | PRN
  Administered 2020-09-01: 4 mg via INTRAVENOUS

## 2020-09-01 MED ORDER — ISOPROPYL ALCOHOL 70 % SOLN
Status: AC
  Filled 2020-09-01: qty 480

## 2020-09-01 MED ORDER — KETOROLAC TROMETHAMINE 30 MG/ML IJ SOLN
INTRAMUSCULAR | Status: DC | PRN
  Administered 2020-09-01: 30 mg via INTRAVENOUS

## 2020-09-01 MED ORDER — ONDANSETRON HCL 4 MG/2ML IJ SOLN
4.0000 mg | Freq: Four times a day (QID) | INTRAMUSCULAR | Status: DC | PRN
  Administered 2020-09-01: 4 mg via INTRAVENOUS
  Filled 2020-09-01: qty 2

## 2020-09-01 MED ORDER — CHLORHEXIDINE GLUCONATE CLOTH 2 % EX PADS
6.0000 | MEDICATED_PAD | Freq: Every day | CUTANEOUS | Status: DC
  Administered 2020-09-02 – 2020-09-05 (×4): 6 via TOPICAL

## 2020-09-01 MED ORDER — PHENYLEPHRINE HCL-NACL 10-0.9 MG/250ML-% IV SOLN
INTRAVENOUS | Status: DC | PRN
  Administered 2020-09-01: 40 ug/min via INTRAVENOUS

## 2020-09-01 MED ORDER — TRANEXAMIC ACID-NACL 1000-0.7 MG/100ML-% IV SOLN
INTRAVENOUS | Status: AC
  Filled 2020-09-01: qty 100

## 2020-09-01 MED ORDER — FENTANYL CITRATE (PF) 100 MCG/2ML IJ SOLN
INTRAMUSCULAR | Status: AC
  Filled 2020-09-01: qty 2

## 2020-09-01 MED ORDER — CEFAZOLIN SODIUM-DEXTROSE 2-4 GM/100ML-% IV SOLN
INTRAVENOUS | Status: AC
  Administered 2020-09-01: 2 g via INTRAVENOUS
  Filled 2020-09-01: qty 100

## 2020-09-01 MED ORDER — FENTANYL CITRATE (PF) 100 MCG/2ML IJ SOLN
INTRAMUSCULAR | Status: DC | PRN
Start: 2020-09-01 — End: 2020-09-01
  Administered 2020-09-01: 100 ug via INTRAVENOUS
  Administered 2020-09-01: 50 ug via INTRAVENOUS

## 2020-09-01 MED ORDER — SODIUM CHLORIDE (PF) 0.9 % IJ SOLN
INTRAMUSCULAR | Status: DC | PRN
  Administered 2020-09-01: 30 mL

## 2020-09-01 MED ORDER — ACETAMINOPHEN 160 MG/5ML PO SOLN: 1000.0000 mg | Freq: Once | ORAL | Status: DC | PRN

## 2020-09-01 MED ORDER — ONDANSETRON HCL 4 MG PO TABS
4.0000 mg | ORAL_TABLET | Freq: Four times a day (QID) | ORAL | Status: DC | PRN
  Filled 2020-09-01: qty 1

## 2020-09-01 MED ORDER — CEFAZOLIN SODIUM-DEXTROSE 2-4 GM/100ML-% IV SOLN
2.0000 g | INTRAVENOUS | Status: AC
  Administered 2020-09-01: 2 g via INTRAVENOUS

## 2020-09-01 MED ORDER — PHENYLEPHRINE 40 MCG/ML (10ML) SYRINGE FOR IV PUSH (FOR BLOOD PRESSURE SUPPORT)
PREFILLED_SYRINGE | INTRAVENOUS | Status: DC | PRN
  Administered 2020-09-01: 80 ug via INTRAVENOUS
  Administered 2020-09-01: 160 ug via INTRAVENOUS

## 2020-09-01 MED ORDER — MENTHOL 3 MG MT LOZG: 1.0000 | LOZENGE | OROMUCOSAL | Status: DC | PRN

## 2020-09-01 MED ORDER — PROPOFOL 10 MG/ML IV BOLUS
INTRAVENOUS | Status: AC
  Filled 2020-09-01: qty 20

## 2020-09-01 MED ORDER — METOCLOPRAMIDE HCL 5 MG PO TABS: 5.0000 mg | ORAL_TABLET | Freq: Three times a day (TID) | ORAL | Status: DC | PRN

## 2020-09-01 MED ORDER — SODIUM CHLORIDE 0.9 % IR SOLN
Status: DC | PRN
  Administered 2020-09-01: 4000 mL

## 2020-09-01 MED ORDER — SUGAMMADEX SODIUM 200 MG/2ML IV SOLN
INTRAVENOUS | Status: DC | PRN
  Administered 2020-09-01: 110 mg via INTRAVENOUS

## 2020-09-01 MED ORDER — LACTATED RINGERS IV SOLN: INTRAVENOUS | Status: DC | PRN

## 2020-09-01 MED ORDER — LIDOCAINE HCL (CARDIAC) PF 100 MG/5ML IV SOSY
PREFILLED_SYRINGE | INTRAVENOUS | Status: DC | PRN
  Administered 2020-09-01: 60 mg via INTRAVENOUS

## 2020-09-01 MED ORDER — ONDANSETRON HCL 4 MG/2ML IJ SOLN
4.0000 mg | Freq: Four times a day (QID) | INTRAMUSCULAR | Status: DC | PRN
  Administered 2020-09-05: 4 mg via INTRAVENOUS
  Filled 2020-09-01: qty 2

## 2020-09-01 MED ORDER — ACETAMINOPHEN 10 MG/ML IV SOLN: 1000.0000 mg | Freq: Once | INTRAVENOUS | Status: DC | PRN

## 2020-09-01 MED ORDER — ACETAMINOPHEN 500 MG PO TABS: 1000.0000 mg | ORAL_TABLET | Freq: Once | ORAL | Status: DC | PRN

## 2020-09-01 MED ORDER — DOCUSATE SODIUM 100 MG PO CAPS
100.0000 mg | ORAL_CAPSULE | Freq: Two times a day (BID) | ORAL | Status: DC
  Administered 2020-09-01 – 2020-09-05 (×7): 100 mg via ORAL
  Filled 2020-09-01 (×8): qty 1

## 2020-09-01 MED ORDER — PHENOL 1.4 % MT LIQD
1.0000 | OROMUCOSAL | Status: DC | PRN
  Filled 2020-09-01: qty 177

## 2020-09-01 MED ORDER — BUPIVACAINE HCL (PF) 0.25 % IJ SOLN
INTRAMUSCULAR | Status: DC | PRN
  Administered 2020-09-01: 30 mL

## 2020-09-01 MED ORDER — ISOPROPYL ALCOHOL 70 % SOLN
Status: DC | PRN
  Administered 2020-09-01: 1 via TOPICAL

## 2020-09-01 MED ORDER — ROCURONIUM BROMIDE 100 MG/10ML IV SOLN
INTRAVENOUS | Status: DC | PRN
  Administered 2020-09-01: 50 mg via INTRAVENOUS
  Administered 2020-09-01: 20 mg via INTRAVENOUS

## 2020-09-01 MED ORDER — SODIUM CHLORIDE (PF) 0.9 % IJ SOLN
INTRAMUSCULAR | Status: AC
  Filled 2020-09-01: qty 50

## 2020-09-01 MED ORDER — ALBUMIN HUMAN 5 % IV SOLN: INTRAVENOUS | Status: DC | PRN

## 2020-09-01 MED ORDER — PHENYLEPHRINE HCL (PRESSORS) 10 MG/ML IV SOLN
INTRAVENOUS | Status: AC
  Filled 2020-09-01: qty 1

## 2020-09-01 MED ORDER — TRANEXAMIC ACID-NACL 1000-0.7 MG/100ML-% IV SOLN
1000.0000 mg | INTRAVENOUS | Status: AC
  Administered 2020-09-01: 1000 mg via INTRAVENOUS

## 2020-09-01 MED ORDER — CLOPIDOGREL BISULFATE 75 MG PO TABS
75.0000 mg | ORAL_TABLET | Freq: Every day | ORAL | Status: DC
  Administered 2020-09-02 – 2020-09-05 (×4): 75 mg via ORAL
  Filled 2020-09-01 (×4): qty 1

## 2020-09-01 MED ORDER — JUVEN PO PACK
1.0000 | PACK | Freq: Two times a day (BID) | ORAL | Status: DC
  Administered 2020-09-02 – 2020-09-05 (×7): 1 via ORAL
  Filled 2020-09-01 (×8): qty 1

## 2020-09-01 MED ORDER — CHLORHEXIDINE GLUCONATE 0.12 % MT SOLN
15.0000 mL | Freq: Once | OROMUCOSAL | Status: AC
  Administered 2020-09-01: 15 mL via OROMUCOSAL

## 2020-09-01 MED ORDER — BUPIVACAINE HCL (PF) 0.25 % IJ SOLN
INTRAMUSCULAR | Status: AC
  Filled 2020-09-01: qty 30

## 2020-09-01 MED ORDER — KATE FARMS STANDARD 1.4 PO LIQD
325.0000 mL | Freq: Every day | ORAL | Status: DC
  Administered 2020-09-02 – 2020-09-03 (×2): 325 mL via ORAL
  Filled 2020-09-01 (×4): qty 325

## 2020-09-01 MED ORDER — LIDOCAINE 2% (20 MG/ML) 5 ML SYRINGE
INTRAMUSCULAR | Status: AC
  Filled 2020-09-01: qty 5

## 2020-09-01 MED ORDER — KETOROLAC TROMETHAMINE 30 MG/ML IJ SOLN
INTRAMUSCULAR | Status: AC
  Filled 2020-09-01: qty 1

## 2020-09-01 MED ORDER — CEFAZOLIN SODIUM-DEXTROSE 2-4 GM/100ML-% IV SOLN
2.0000 g | Freq: Four times a day (QID) | INTRAVENOUS | Status: AC
  Administered 2020-09-02: 2 g via INTRAVENOUS
  Filled 2020-09-01 (×2): qty 100

## 2020-09-01 MED ORDER — ENSURE ENLIVE PO LIQD
237.0000 mL | Freq: Two times a day (BID) | ORAL | Status: DC
  Administered 2020-09-02 – 2020-09-05 (×7): 237 mL via ORAL

## 2020-09-01 MED ORDER — METOCLOPRAMIDE HCL 5 MG/ML IJ SOLN: 5.0000 mg | Freq: Three times a day (TID) | INTRAMUSCULAR | Status: DC | PRN

## 2020-09-01 SURGICAL SUPPLY — 62 items
BAG DECANTER FOR FLEXI CONT (MISCELLANEOUS) IMPLANT
BAG ZIPLOCK 12X15 (MISCELLANEOUS) IMPLANT
BLADE SURG SZ10 CARB STEEL (BLADE) IMPLANT
CHLORAPREP W/TINT 26 (MISCELLANEOUS) ×3 IMPLANT
COVER PERINEAL POST (MISCELLANEOUS) ×3 IMPLANT
COVER SURGICAL LIGHT HANDLE (MISCELLANEOUS) ×3 IMPLANT
COVER WAND RF STERILE (DRAPES) IMPLANT
CUP SECTOR GRIPTON 58MM (Orthopedic Implant) ×2 IMPLANT
DECANTER SPIKE VIAL GLASS SM (MISCELLANEOUS) ×3 IMPLANT
DERMABOND ADVANCED (GAUZE/BANDAGES/DRESSINGS) ×2
DERMABOND ADVANCED .7 DNX12 (GAUZE/BANDAGES/DRESSINGS) ×2 IMPLANT
DRAPE IMP U-DRAPE 54X76 (DRAPES) ×3 IMPLANT
DRAPE SHEET LG 3/4 BI-LAMINATE (DRAPES) ×9 IMPLANT
DRAPE STERI IOBAN 125X83 (DRAPES) ×2 IMPLANT
DRAPE U-SHAPE 47X51 STRL (DRAPES) ×6 IMPLANT
DRSG AQUACEL AG ADV 3.5X10 (GAUZE/BANDAGES/DRESSINGS) ×3 IMPLANT
ELECT REM PT RETURN 15FT ADLT (MISCELLANEOUS) ×3 IMPLANT
GAUZE SPONGE 4X4 12PLY STRL (GAUZE/BANDAGES/DRESSINGS) ×3 IMPLANT
GLOVE BIO SURGEON STRL SZ8.5 (GLOVE) ×6 IMPLANT
GLOVE BIOGEL M STRL SZ7.5 (GLOVE) ×6 IMPLANT
GLOVE BIOGEL PI IND STRL 8 (GLOVE) ×1 IMPLANT
GLOVE BIOGEL PI IND STRL 8.5 (GLOVE) ×1 IMPLANT
GLOVE BIOGEL PI INDICATOR 8 (GLOVE) ×2
GLOVE BIOGEL PI INDICATOR 8.5 (GLOVE) ×2
GOWN SPEC L3 XXLG W/TWL (GOWN DISPOSABLE) ×3 IMPLANT
GOWN STRL REUS W/ TWL LRG LVL3 (GOWN DISPOSABLE) ×1 IMPLANT
GOWN STRL REUS W/TWL LRG LVL3 (GOWN DISPOSABLE) ×6
HANDPIECE INTERPULSE COAX TIP (DISPOSABLE) ×2
HEAD M SROM 36MM 2 (Hips) IMPLANT
HOLDER FOLEY CATH W/STRAP (MISCELLANEOUS) ×3 IMPLANT
HOOD PEEL AWAY FLYTE STAYCOOL (MISCELLANEOUS) ×12 IMPLANT
JET LAVAGE IRRISEPT WOUND (IRRIGATION / IRRIGATOR) ×3
KIT TURNOVER KIT A (KITS) ×2 IMPLANT
LAVAGE JET IRRISEPT WOUND (IRRIGATION / IRRIGATOR) ×1 IMPLANT
LINER NEUTRAL 52X36X58N (Liner) ×2 IMPLANT
MANIFOLD NEPTUNE II (INSTRUMENTS) ×3 IMPLANT
MARKER SKIN DUAL TIP RULER LAB (MISCELLANEOUS) ×3 IMPLANT
NDL SAFETY ECLIPSE 18X1.5 (NEEDLE) ×1 IMPLANT
NDL SPNL 18GX3.5 QUINCKE PK (NEEDLE) ×1 IMPLANT
NEEDLE HYPO 18GX1.5 SHARP (NEEDLE) ×2
NEEDLE SPNL 18GX3.5 QUINCKE PK (NEEDLE) ×3 IMPLANT
PACK ANTERIOR HIP CUSTOM (KITS) ×3 IMPLANT
PENCIL SMOKE EVACUATOR (MISCELLANEOUS) IMPLANT
SAW OSC TIP CART 19.5X105X1.3 (SAW) ×3 IMPLANT
SEALER BIPOLAR AQUA 6.0 (INSTRUMENTS) ×3 IMPLANT
SET HNDPC FAN SPRY TIP SCT (DISPOSABLE) ×1 IMPLANT
SROM M HEAD 36MM 2 (Hips) ×3 IMPLANT
STEM TRI LOC GRIPTION SZ10 STD IMPLANT
SUT ETHIBOND NAB CT1 #1 30IN (SUTURE) ×6 IMPLANT
SUT MNCRL AB 3-0 PS2 18 (SUTURE) ×3 IMPLANT
SUT MNCRL AB 4-0 PS2 18 (SUTURE) ×3 IMPLANT
SUT MON AB 2-0 CT1 36 (SUTURE) ×6 IMPLANT
SUT STRATAFIX PDO 1 14 VIOLET (SUTURE) ×2
SUT STRATFX PDO 1 14 VIOLET (SUTURE) ×1
SUT VIC AB 2-0 CT1 27 (SUTURE) ×2
SUT VIC AB 2-0 CT1 TAPERPNT 27 (SUTURE) ×1 IMPLANT
SUTURE STRATFX PDO 1 14 VIOLET (SUTURE) ×1 IMPLANT
SYR 3ML LL SCALE MARK (SYRINGE) ×3 IMPLANT
TRAY FOLEY MTR SLVR 16FR STAT (SET/KITS/TRAYS/PACK) IMPLANT
TRI LOC GRIPTION SZ 10 STD ×3 IMPLANT
TUBE SUCTION HIGH CAP CLEAR NV (SUCTIONS) ×3 IMPLANT
WATER STERILE IRR 1000ML POUR (IV SOLUTION) ×3 IMPLANT

## 2020-09-01 NOTE — Transfer of Care (Signed)
Immediate Anesthesia Transfer of Care Note  Patient: Nami Strawder  Procedure(s) Performed: TOTAL HIP ARTHROPLASTY ANTERIOR APPROACH (Right Hip)  Patient Location: PACU  Anesthesia Type:General  Level of Consciousness: drowsy and patient cooperative  Airway & Oxygen Therapy: Patient Spontanous Breathing and Patient connected to face mask oxygen  Post-op Assessment: Report given to RN and Post -op Vital signs reviewed and stable  Post vital signs: Reviewed and stable  Last Vitals:  Vitals Value Taken Time  BP 150/66 09/01/20 1541  Temp    Pulse 61 09/01/20 1544  Resp 14 09/01/20 1544  SpO2 100 % 09/01/20 1544  Vitals shown include unvalidated device data.  Last Pain:  Vitals:   09/01/20 1217  TempSrc: Oral  PainSc:          Complications: No complications documented.

## 2020-09-01 NOTE — ED Notes (Signed)
Pt resting comfortably. Denies any pain at present. Medicated for nausea per order. Pt NPO for surgery later today.

## 2020-09-01 NOTE — ED Notes (Signed)
Pt requesting a bedpan. Purewik put in place instead of bedpan for pt comfort.

## 2020-09-01 NOTE — Anesthesia Procedure Notes (Signed)
Procedure Name: Intubation Date/Time: 09/01/2020 1:41 PM Performed by: Raenette Rover, CRNA Pre-anesthesia Checklist: Patient identified, Emergency Drugs available, Suction available and Patient being monitored Patient Re-evaluated:Patient Re-evaluated prior to induction Oxygen Delivery Method: Circle system utilized Preoxygenation: Pre-oxygenation with 100% oxygen Induction Type: IV induction Ventilation: Mask ventilation without difficulty Laryngoscope Size: Mac and 3 Grade View: Grade I Tube type: Oral Tube size: 7.0 mm Number of attempts: 1 Airway Equipment and Method: Stylet Placement Confirmation: ETT inserted through vocal cords under direct vision,  positive ETCO2 and breath sounds checked- equal and bilateral Secured at: 22 cm Tube secured with: Tape Dental Injury: Teeth and Oropharynx as per pre-operative assessment

## 2020-09-01 NOTE — Op Note (Signed)
OPERATIVE REPORT  SURGEON: Rod Can, MD   ASSISTANT: Cherlynn June, PA-C  PREOPERATIVE DIAGNOSIS: Displaced Right femoral neck fracture.   POSTOPERATIVE DIAGNOSIS: Displaced Right femoral neck fracture.   PROCEDURE: Right total hip arthroplasty, anterior approach.   IMPLANTS: DePuy Tri Lock stem, size 10, std offset. DePuy Pinnacle Cup, size 58 mm. DePuy Altrx liner, size 36 by 58 mm, neutral. DePuy metal head ball, size 36 -2.5 mm.  ANESTHESIA:  General  ANTIBIOTICS: 2g ancef.  ESTIMATED BLOOD LOSS:-300 mL    DRAINS: None.  COMPLICATIONS: None   CONDITION: PACU - hemodynamically stable.   BRIEF CLINICAL NOTE: Kristin [name] is a 79 y.o. female with a displaced Right femoral neck fracture. The patient was admitted to the hospitalist service and underwent perioperative risk stratification and medical optimization. She has a history of colon cancer, breast cancer, and metastatic lung cancer. MRI of the femur was negative for metastatic disease. We waited 48 hours for surgery due to xarelto usage. The risks, benefits, and alternatives to total hip arthroplasty were explained, and the patient elected to proceed.  PROCEDURE IN DETAIL: The patient was taken to the operating room and general anesthesia was induced on the hospital bed.  The patient was then positioned on the Hana table.  All bony prominences were well padded.  The hip was prepped and draped in the normal sterile surgical fashion.  A time-out was called verifying side and site of surgery. Antibiotics were given within 60 minutes of beginning the procedure.  The direct anterior approach to the hip was performed through the Hueter interval.  Lateral femoral circumflex vessels were treated with the Auqumantys. The anterior capsule was exposed and an inverted T capsulotomy was made.  Fracture hematoma was encountered and evacuated. The patient was found to have a comminuted Right subcapital femoral neck fracture.   I freshened the femoral neck cut with a saw.  I removed the femoral neck fragment.  A corkscrew was placed into the head and the head was removed.  This was passed to the back table and was measured.   Acetabular exposure was achieved, and the pulvinar and labrum were excised. Sequential reaming of the acetabulum was then performed up to a size 57 mm reamer. A 58 mm cup was then opened and impacted into place at approximately 40 degrees of abduction and 20 degrees of anteversion. The final polyethylene liner was impacted into place.    I then gained femoral exposure taking care to protect the abductors and greater trochanter.  This was performed using standard external rotation, extension, and adduction.  The capsule was peeled off the inner aspect of the greater trochanter, taking care to preserve the short external rotators. A cookie cutter was used to enter the femoral canal, and then the femoral canal finder was used to confirm location.  I then sequentially broached up to a size 10.  Calcar planer was used on the femoral neck remnant.  I paced a std neck and a trial head ball. The hip was reduced.  Leg lengths were checked fluoroscopically.  The hip was dislocated and trial components were removed.  I placed the real stem followed by the real spacer and head ball.  The hip was reduced.  Fluoroscopy was used to confirm component position and leg lengths.  At 90 degrees of external rotation and extension, the hip was stable to an anterior directed force.   The wound was copiously irrigated with Irrisept solution and normal saline using pule lavage.  Marcaine  solution was injected into the periarticular soft tissue.  The wound was closed in layers using #1 Vicryl and V-Loc for the fascia, 2-0 Vicryl for the subcutaneous fat, 2-0 Monocryl for the deep dermal layer, and staples plus gllue for the skin.  Once the glue was fully dried, an Aquacell Ag dressing was applied.  The patient was then awakened from  anesthesia and transported to the recovery room in stable condition.  Sponge, needle, and instrument counts were correct at the end of the case x2.  The patient tolerated the procedure well and there were no known complications.  Please note that a surgical assistant was a medical necessity for this procedure to perform it in a safe and expeditious manner. Assistant was necessary to provide appropriate retraction of vital neurovascular structures, to prevent femoral fracture, and to allow for anatomic placement of the prosthesis.

## 2020-09-01 NOTE — Progress Notes (Signed)
Initial Nutrition Assessment  DOCUMENTATION CODES:   Not applicable  INTERVENTION:  CIB daily on breakfast tray, each supplement with 237 ml whole milk provides 280 kcal and 13 grams of protein  [employer] 1.4 po daily, each supplement provides 455 kcal and 20 grams of protein  Juven BID, each packet provides 95 calories, 2.5 grams of protein (collagen), and 9.8 grams of carbohydrate (3 grams sugar); also contains 7 grams of L-arginine and L-glutamine, 300 mg vitamin C, 15 mg vitamin E, 1.2 mcg vitamin B-12, 9.5 mg zinc, 200 mg calcium, and 1.5 g  Calcium Beta-hydroxy-Beta-methylbutyrate to support wound healing    NUTRITION DIAGNOSIS:   Increased nutrient needs related to post-op healing, hip fracture as evidenced by estimated needs.    GOAL:   Patient will meet greater than or equal to 90% of their needs    MONITOR:   Labs, I & O's, Diet advancement, Supplement acceptance, PO intake, Weight trends, Skin  REASON FOR ASSESSMENT:   Consult Assessment of nutrition requirement/status (hip fx)  ASSESSMENT:  RD working remotely.  79 year old female with history of lung cancer, breast cancer, and colon cancer followed by oncology, paroxysmal atrial fibrillation, HTN, CKD stage III, anemia of chronic disease, PE and DVT (spring 2021) who presents after mechanical fall with right hip pain.  Patient admitted on 08/30/20 with closed fracture of right femoral neck.  She is currently off floor for total right hip arthroplasty, unable to obtain nutrition history at this time. Patient was seen by this RD during a previous admission on 02/11/20. Daughter was present at that time who reported ongoing poor appetite and intake at home. Patient drinking Ensure supplements, however daughter reports pt did not tolerate them well. CIB offered as an alternate supplement which she was consuming along with Boost Breeze supplement. Will monitor for diet advancement and order CIB with breakfast and  [employer] 1.4 daily to aid with meeting needs as well as Juven to support post-op wound healing.   Per chart, weights have been fairly stable over the last 6 months.   Medications reviewed   Labs: BUN 25 (H), Cr 1.06 (H)  NUTRITION - FOCUSED PHYSICAL EXAM: Unable to complete at this time, RD working remotely.  Diet Order:   Diet Order            Diet NPO time specified Except for: Sips with Meds  Diet effective now                 EDUCATION NEEDS:   No education needs have been identified at this time  Skin:  Skin Assessment: Skin Integrity Issues: Skin Integrity Issues:: Incisions Incisions: closed; right thigh  Last BM:  pta  Height:   Ht Readings from Last 1 Encounters:  09/01/20 5\' 8"  (1.727 m)    Weight:   Wt Readings from Last 1 Encounters:  09/01/20 54 kg    BMI:  Body mass index is 18.09 kg/m.  Estimated Nutritional Needs:   Kcal:  1700-1900  Protein:  85-95  Fluid:  >1.3 L/day   Lajuan Lines, RD, LDN Clinical Nutrition After Hours/Weekend Pager # in [place]

## 2020-09-01 NOTE — Anesthesia Preprocedure Evaluation (Addendum)
Anesthesia Evaluation  Patient identified by MRN, date of birth, ID band Patient awake    Reviewed: Allergy & Precautions, NPO status , Patient's Chart, lab work & pertinent test results  History of Anesthesia Complications Negative for: history of anesthetic complications  Airway Mallampati: III  TM Distance: >3 FB Neck ROM: Full    Dental  (+) Dental Advisory Given, Teeth Intact   Pulmonary neg shortness of breath, neg COPD, neg recent URI, PE Covid-19 Nucleic Acid Test Results Lab Results      Component                Value               Date                      [place]              NEGATIVE            08/30/2020                [place]              NEGATIVE            02/10/2020              breath sounds clear to auscultation       Cardiovascular hypertension, Pt. on medications + dysrhythmias Atrial Fibrillation  Rhythm:Irregular     Neuro/Psych neg Seizures PSYCHIATRIC DISORDERS Anxiety Depression CVA, No Residual Symptoms    GI/Hepatic Neg liver ROS, hiatal hernia, GERD  ,  Endo/Other  Hypothyroidism   Renal/GU Renal InsufficiencyRenal diseaseLab Results      Component                Value               Date                      CREATININE               1.06 (H)            09/01/2020                Musculoskeletal  (+) Arthritis , RIGHT HIP FRACTURE   Abdominal   Peds  Hematology  (+) Blood dyscrasia, anemia , Lab Results      Component                Value               Date                      WBC                      9.8                 09/01/2020                HGB                      10.0 (L)            09/01/2020                HCT                      31.2 (L)  09/01/2020                MCV                      98.1                09/01/2020                PLT                      231                 09/01/2020              Anesthesia Other Findings    Reproductive/Obstetrics                            Anesthesia Physical Anesthesia Plan  ASA: III  Anesthesia Plan: General   Post-op Pain Management:    Induction: Intravenous  PONV Risk Score and Plan: 3 and Ondansetron and Dexamethasone  Airway Management Planned: Oral ETT  Additional Equipment: None  Intra-op Plan:   Post-operative Plan: Extubation in OR  Informed Consent: I have reviewed the patients History and Physical, chart, labs and discussed the procedure including the risks, benefits and alternatives for the proposed anesthesia with the patient or authorized representative who has indicated his/her understanding and acceptance.     Dental advisory given  Plan Discussed with: CRNA and Surgeon  Anesthesia Plan Comments:         Anesthesia Quick Evaluation

## 2020-09-01 NOTE — Telephone Encounter (Signed)
Pt daughter LM advising pt fell and broke her hip and will not make her appts next week.  I called Ms. Belcher back to acknowledged the call and advised to call back to reschedule once pt is out of the hospital.

## 2020-09-01 NOTE — Discharge Instructions (Signed)
°Dr. Chaunda Vandergriff °Joint Replacement Specialist °[facility] °[address] °[place], [place] [address] °[phone] ° ° °TOTAL HIP REPLACEMENT POSTOPERATIVE DIRECTIONS ° ° ° °Hip Rehabilitation, Guidelines Following Surgery  ° °WEIGHT BEARING °Weight bearing as tolerated with assist device (walker, cane, etc) as directed, use it as long as suggested by your surgeon or therapist, typically at least 4-6 weeks. ° °The results of a hip operation are greatly improved after range of motion and muscle strengthening exercises. Follow all safety measures which are given to protect your hip. If any of these exercises cause increased pain or swelling in your joint, decrease the amount until you are comfortable again. Then slowly increase the exercises. Call your caregiver if you have problems or questions.  ° °HOME CARE INSTRUCTIONS  °Most of the following instructions are designed to prevent the dislocation of your new hip.  °Remove items at home which could result in a fall. This includes throw rugs or furniture in walking pathways.  °Continue medications as instructed at time of discharge. °· You may have some home medications which will be placed on hold until you complete the course of blood thinner medication. °· You may start showering once you are discharged home. Do not remove your dressing. °Do not put on socks or shoes without following the instructions of your caregivers.   °Sit on chairs with arms. Use the chair arms to help push yourself up when arising.  °Arrange for the use of a toilet seat elevator so you are not sitting low.  °· Walk with walker as instructed.  °You may resume a sexual relationship in one month or when given the OK by your caregiver.  °Use walker as long as suggested by your caregivers.  °You may put full weight on your legs and walk as much as is comfortable. °Avoid periods of inactivity such as sitting longer than an hour when not asleep. This helps prevent  blood clots.  °You may return to work once you are cleared by your surgeon.  °Do not drive a car for 6 weeks or until released by your surgeon.  °Do not drive while taking narcotics.  °Wear elastic stockings for two weeks following surgery during the day but you may remove then at night.  °Make sure you keep all of your appointments after your operation with all of your doctors and caregivers. You should call the office at the above phone number and make an appointment for approximately two weeks after the date of your surgery. °Please pick up a stool softener and laxative for home use as long as you are requiring pain medications. °· ICE to the affected hip every three hours for 30 minutes at a time and then as needed for pain and swelling. Continue to use ice on the hip for pain and swelling from surgery. You may notice swelling that will progress down to the foot and ankle.  This is normal after surgery.  Elevate the leg when you are not up walking on it.   °It is important for you to complete the blood thinner medication as prescribed by your doctor. °· Continue to use the breathing machine which will help keep your temperature down.  It is common for your temperature to cycle up and down following surgery, especially at night when you are not up moving around and exerting yourself.  The breathing machine keeps your lungs expanded and your temperature down. ° °RANGE OF MOTION AND STRENGTHENING EXERCISES  °These exercises are   designed to help you keep full movement of your hip joint. Follow your caregiver's or physical therapist's instructions. Perform all exercises about fifteen times, three times per day or as directed. Exercise both hips, even if you have had only one joint replacement. These exercises can be done on a training (exercise) mat, on the floor, on a table or on a bed. Use whatever works the best and is most comfortable for you. Use music or television while you are exercising so that the exercises  are a pleasant break in your day. This will make your life better with the exercises acting as a break in routine you can look forward to.  °Lying on your back, slowly slide your foot toward your buttocks, raising your knee up off the floor. Then slowly slide your foot back down until your leg is straight again.  °Lying on your back spread your legs as far apart as you can without causing discomfort.  °Lying on your side, raise your upper leg and foot straight up from the floor as far as is comfortable. Slowly lower the leg and repeat.  °Lying on your back, tighten up the muscle in the front of your thigh (quadriceps muscles). You can do this by keeping your leg straight and trying to raise your heel off the floor. This helps strengthen the largest muscle supporting your knee.  °Lying on your back, tighten up the muscles of your buttocks both with the legs straight and with the knee bent at a comfortable angle while keeping your heel on the floor.  ° °SKILLED REHAB INSTRUCTIONS: °If the patient is transferred to a skilled rehab facility following release from the hospital, a list of the current medications will be sent to the facility for the patient to continue.  When discharged from the skilled rehab facility, please have the facility set up the patient's Home Health Physical Therapy prior to being released. Also, the skilled facility will be responsible for providing the patient with their medications at time of release from the facility to include their pain medication and their blood thinner medication. If the patient is still at the rehab facility at time of the two week follow up appointment, the skilled rehab facility will also need to assist the patient in arranging follow up appointment in our office and any transportation needs. ° °MAKE SURE YOU:  °Understand these instructions.  °Will watch your condition.  °Will get help right away if you are not doing well or get worse. ° °Pick up stool softner and  laxative for home use following surgery while on pain medications. °Do not remove your dressing. °The dressing is waterproof--it is OK to take showers. °Continue to use ice for pain and swelling after surgery. °Do not use any lotions or creams on the incision until instructed by your surgeon. °Total Hip Protocol. ° ° °

## 2020-09-01 NOTE — Progress Notes (Signed)
PROGRESS NOTE  Kristin [name] IRC:[id number] DOB: Nov 20, 1940 DOA: 08/30/2020 PCP: Eber Hong, MD   LOS: 2 days   Brief Narrative / Interim history: 79 year old female with lung adenocarcinoma, history of breast and colon cancer, followed by oncology, paroxysmal A. fib on Xarelto, HTN, anemia of chronic disease, chronic kidney disease stage III, PE and DVT comes into the hospital after having a fall at home.  She stood up, had some dizziness. Imaging in the ER shows closed displaced fracture of the right femoral neck.  Orthopedic surgery consulted and patient was admitted to the hospitalist service.  Subjective / 24h Interval events: No chest pain, no shortness of breath, still in the ED.  Had some nausea earlier this morning.  Scheduled for surgery   Assessment & Plan: Principal Problem Closed displaced fracture of the right femoral neck -Orthopedic surgery consulted, plan for surgical repair today, more than 48 hours from her last Xarelto dose -Continue pain control, postoperative physical therapy evaluation  Active Problems Essential hypertension -Continue furosemide, blood pressure stable/high but probably due to pain  Paroxysmal A. Fib -Continue amiodarone, hold Xarelto  History of DVT/PE  -Hold Xarelto, resume postop when cleared by orthopedic surgery  Adenocarcinoma of the lung, personal history of breast cancer -Outpatient management  Chronic kidney disease stage IIIb -Baseline creatinine 1.2, currently at baseline 1.0  Scheduled Meds: . amiodarone  200 mg Oral Daily  . brimonidine  1 drop Right Eye BID   And  . timolol  1 drop Right Eye BID  . cycloSPORINE  1 drop Both Eyes BID  . escitalopram  10 mg Oral Daily  . famotidine  40 mg Oral BID  . furosemide  10 mg Oral Daily  . influenza vaccine adjuvanted  0.5 mL Intramuscular Tomorrow-1000  . letrozole  2.5 mg Oral Daily  . osimertinib mesylate  80 mg Oral Daily   Continuous Infusions: . sodium chloride      PRN Meds:.acetaminophen, HYDROmorphone (DILAUDID) injection, ondansetron (ZOFRAN) IV, senna-docusate, traMADol  Diet Orders (From admission, onward)    Start     Ordered   09/01/20 0702  Diet NPO time specified  Diet effective now        09/01/20 0701          DVT prophylaxis: SCDs Start: 08/30/20 2127     Code Status: Full Code  Family Communication: No family at bedside this morning  Status is: Inpatient  Remains inpatient appropriate because:Ongoing diagnostic testing needed not appropriate for outpatient work up and Inpatient level of care appropriate due to severity of illness   Dispo: The patient is from: Home              Anticipated d/c is to: SNF              Anticipated d/c date is: 3 days              Patient currently is not medically stable to d/c.  Consultants:  Orthopedic surgery   Procedures:  None   Microbiology  None   Antimicrobials: None     Objective: Vitals:   09/01/20 0845 09/01/20 0853 09/01/20 0915 09/01/20 0945  BP: (!) 160/67 (!) 160/67 (!) 142/71 (!) 156/72  Pulse: 71 69 67 69  Resp: 15 19 18 17   Temp:      TempSrc:      SpO2: 99% 100% 100% 99%   No intake or output data in the 24 hours ending 09/01/20 1009 There were no vitals filed  for this visit.  Examination:  Constitutional: No distress, lying in bed Eyes: No scleral icterus ENMT: mmm Neck: normal, supple Respiratory: Lungs are clear bilaterally, moves air well, no wheezing or crackles heard Cardiovascular: Heart is regular, no murmurs.  No peripheral edema Abdomen: Abdomen is nondistended, bowel sounds positive Musculoskeletal: no clubbing / cyanosis.  Skin: No rashes appreciated Neurologic: Nonfocal, lower extremity deferred due to hip fracture   Data Reviewed: I have independently reviewed following labs and imaging studies   CBC: Recent Labs  Lab 08/30/20 1700 08/31/20 0625 09/01/20 0612  WBC 11.9* 8.7 9.8  HGB 11.7* 9.1* 10.0*  HCT 36.1 28.8* 31.2*   MCV 97.6 98.3 98.1  PLT 268 213 937   Basic Metabolic Panel: Recent Labs  Lab 08/30/20 1700 09/01/20 0612  NA 136 137  K 4.2 4.4  CL 102 101  CO2 25 26  GLUCOSE 117* 118*  BUN 21 25*  CREATININE 1.19* 1.06*  CALCIUM 9.8 9.8   Liver Function Tests: Recent Labs  Lab 09/01/20 0612  AST 14*  ALT 15  ALKPHOS 78  BILITOT 0.7  PROT 5.9*  ALBUMIN 3.1*   Coagulation Profile: No results for input(s): INR, PROTIME in the last 168 hours. HbA1C: No results for input(s): HGBA1C in the last 72 hours. CBG: Recent Labs  Lab 08/30/20 1617  GLUCAP 108*    Recent Results (from the past 240 hour(s))  Respiratory Panel by RT PCR (Flu A&B, Covid) - Nasopharyngeal Swab     Status: None   Collection Time: 08/30/20  6:46 PM   Specimen: Nasopharyngeal Swab  Result Value Ref Range Status   SARS Coronavirus 2 by RT PCR NEGATIVE NEGATIVE Final    Comment: (NOTE) SARS-CoV-2 target nucleic acids are NOT DETECTED.  The SARS-CoV-2 RNA is generally detectable in upper respiratoy specimens during the acute phase of infection. The lowest concentration of SARS-CoV-2 viral copies this assay can detect is 131 copies/mL. A negative result does not preclude SARS-Cov-2 infection and should not be used as the sole basis for treatment or other patient management decisions. A negative result may occur with  improper specimen collection/handling, submission of specimen other than nasopharyngeal swab, presence of viral mutation(s) within the areas targeted by this assay, and inadequate number of viral copies (<131 copies/mL). A negative result must be combined with clinical observations, patient history, and epidemiological information. The expected result is Negative.  Fact Sheet for Patients:  PinkCheek.be  Fact Sheet for Healthcare Providers:  GravelBags.it  This test is no t yet approved or cleared by the [place] FDA and  has  been authorized for detection and/or diagnosis of SARS-CoV-2 by FDA under an Emergency Use Authorization (EUA). This EUA will remain  in effect (meaning this test can be used) for the duration of the COVID-19 declaration under Section 564(b)(1) of the Act, 21 U.S.C. section 360bbb-3(b)(1), unless the authorization is terminated or revoked sooner.     Influenza A by PCR NEGATIVE NEGATIVE Final   Influenza B by PCR NEGATIVE NEGATIVE Final    Comment: (NOTE) The Xpert Xpress SARS-CoV-2/FLU/RSV assay is intended as an aid in  the diagnosis of influenza from Nasopharyngeal swab specimens and  should not be used as a sole basis for treatment. Nasal washings and  aspirates are unacceptable for Xpert Xpress SARS-CoV-2/FLU/RSV  testing.  Fact Sheet for Patients: PinkCheek.be  Fact Sheet for Healthcare Providers: GravelBags.it  This test is not yet approved or cleared by the [place] FDA and  has  been authorized for detection and/or diagnosis of SARS-CoV-2 by  FDA under an Emergency Use Authorization (EUA). This EUA will remain  in effect (meaning this test can be used) for the duration of the  Covid-19 declaration under Section 564(b)(1) of the Act, 21  U.S.C. section 360bbb-3(b)(1), unless the authorization is  terminated or revoked. Performed at [facility], [place] [address] [place], [place] [address]      Radiology Studies: No results found.  Marzetta Board, MD, PhD [facility]  Between 7 am - 7 pm I am available, please contact me via Amion or Securechat  Between 7 pm - 7 am I am not available, please contact night coverage MD/APP via Amion

## 2020-09-01 NOTE — ED Notes (Signed)
Short stay called advised they would be to get pt soon. All jewelry/ glasses removed and given to husband at bedside. CHG bath given.

## 2020-09-01 NOTE — Interval H&P Note (Signed)
History and Physical Interval Note:  09/01/2020 1:28 PM  Kristin [name]  has presented today for surgery, with the diagnosis of RIGHT HIP FRACTURE.  The various methods of treatment have been discussed with the patient and family. After consideration of risks, benefits and other options for treatment, the patient has consented to  Procedure(s): TOTAL HIP ARTHROPLASTY ANTERIOR APPROACH (Right) as a surgical intervention.  The patient's history has been reviewed, patient examined, no change in status, stable for surgery.  I have reviewed the patient's chart and labs.  Questions were answered to the patient's satisfaction.    The risks, benefits, and alternatives were discussed with the patient. There are risks associated with the surgery including, but not limited to, problems with anesthesia (death), infection, instability (giving out of the joint), dislocation, differences in leg length/angulation/rotation, fracture of bones, loosening or failure of implants, hematoma (blood accumulation) which may require surgical drainage, blood clots, pulmonary embolism, nerve injury (foot drop and lateral thigh numbness), and blood vessel injury. The patient understands these risks and elects to proceed.   Hilton Cork Erica Osuna

## 2020-09-02 DIAGNOSIS — S72001A Fracture of unspecified part of neck of right femur, initial encounter for closed fracture: Secondary | ICD-10-CM | POA: Diagnosis not present

## 2020-09-02 DIAGNOSIS — N1832 Chronic kidney disease, stage 3b: Secondary | ICD-10-CM | POA: Diagnosis not present

## 2020-09-02 DIAGNOSIS — C3491 Malignant neoplasm of unspecified part of right bronchus or lung: Secondary | ICD-10-CM | POA: Diagnosis not present

## 2020-09-02 LAB — BASIC METABOLIC PANEL
Anion gap: 6 (ref 5–15)
BUN: 25 mg/dL — ABNORMAL HIGH (ref 8–23)
CO2: 31 mmol/L (ref 22–32)
Calcium: 9.5 mg/dL (ref 8.9–10.3)
Chloride: 98 mmol/L (ref 98–111)
Creatinine, Ser: 0.96 mg/dL (ref 0.44–1.00)
GFR, Estimated: 59 mL/min — ABNORMAL LOW (ref >60)
Glucose, Bld: 132 mg/dL — ABNORMAL HIGH (ref 70–99)
Potassium: 4.8 mmol/L (ref 3.5–5.1)
Sodium: 135 mmol/L (ref 135–145)

## 2020-09-02 LAB — CBC
HCT: 28.6 % — ABNORMAL LOW (ref 36.0–46.0)
Hemoglobin: 9 g/dL — ABNORMAL LOW (ref 12.0–15.0)
MCH: 31 pg (ref 26.0–34.0)
MCHC: 31.5 g/dL (ref 30.0–36.0)
MCV: 98.6 fL (ref 80.0–100.0)
Platelets: 200 10*3/uL (ref 150–400)
RBC: 2.9 MIL/uL — ABNORMAL LOW (ref 3.87–5.11)
RDW: 13.3 % (ref 11.5–15.5)
WBC: 9.9 10*3/uL (ref 4.0–10.5)
nRBC: 0 % (ref 0.0–0.2)

## 2020-09-02 MED ORDER — RIVAROXABAN 15 MG PO TABS
15.0000 mg | ORAL_TABLET | Freq: Every day | ORAL | Status: DC
  Administered 2020-09-03 – 2020-09-05 (×3): 15 mg via ORAL
  Filled 2020-09-02 (×3): qty 1

## 2020-09-02 MED ORDER — RIVAROXABAN 20 MG PO TABS: 20.0000 mg | ORAL_TABLET | Freq: Every day | ORAL | Status: DC

## 2020-09-02 MED ORDER — RIVAROXABAN 10 MG PO TABS
10.0000 mg | ORAL_TABLET | Freq: Every day | ORAL | Status: AC
  Administered 2020-09-02: 10 mg via ORAL
  Filled 2020-09-02: qty 1

## 2020-09-02 NOTE — Evaluation (Signed)
Physical Therapy Evaluation Patient Details Name: Kristin [name] MRN: [id number] DOB: [birth year] Today's Date: 09/02/2020   History of Present Illness  79 year old female with lung adenocarcinoma, history of breast and colon cancer, followed by oncology, paroxysmal A. fib on Xarelto, HTN, anemia of chronic disease, chronic kidney disease stage III, PE and DVT comes into the hospital after having a fall at home.  She stood up, had some dizziness. Imaging in the ER shows closed displaced fracture of the right femoral neck.  Orthopedic surgery consulted and patient was admitted to the hospitalist service. Pt underwent DA THA for fixation of R femoral neck fx.  Clinical Impression  Pt admitted with above diagnosis. Pt able to amb short distance with +2 min assist. Dizzy after ~ 6'. BP 86/65, RN aware. (pt has not been OOB since Wednesday when she came to ED).  dtr and pt are motivated to d/c home with HHPT services,  recommend HHPT and [place]. dtr is open to hiring a sitter intermittently if needed as well. Will continue to follow in acute setting.   Pt currently with functional limitations due to the deficits listed below (see PT Problem List). Pt will benefit from skilled PT to increase their independence and safety with mobility to allow discharge to the venue listed below.       Follow Up Recommendations Home health PT ([place])    Equipment Recommendations  3in1 (PT)    Recommendations for Other Services       Precautions / Restrictions Precautions Precautions: Fall Restrictions Weight Bearing Restrictions: No RLE Weight Bearing: Weight bearing as tolerated      Mobility  Bed Mobility Overal bed mobility: Needs Assistance Bed Mobility: Supine to Sit     Supine to sit: Mod assist;+2 for safety/equipment     General bed mobility comments: assist with LEs and trunk    Transfers Overall transfer level: Needs assistance Equipment used: Rolling walker (2 wheeled) Transfers: Sit  to/from Stand Sit to Stand: Mod assist;Min assist;+2 safety/equipment         General transfer comment: assist to rise and transiton to RW  Ambulation/Gait Ambulation/Gait assistance: Min assist;+2 safety/equipment Gait Distance (Feet): 6 Feet Assistive device: Rolling walker (2 wheeled) Gait Pattern/deviations: Step-to pattern;Decreased stance time - right     General Gait Details: cues for sequence and RW position. pt dizzy after distance above, BP 86/65, SpO2=90-92% on RA  Stairs            Wheelchair Mobility    Modified Rankin (Stroke Patients Only)       Balance Overall balance assessment: Needs assistance;History of Falls Sitting-balance support: Feet supported;Bilateral upper extremity supported Sitting balance-Leahy Scale: Fair       Standing balance-Leahy Scale: Poor Standing balance comment: reliant on UEs and external support                             Pertinent Vitals/Pain Pain Assessment: No/denies pain    Home Living   Living Arrangements: Spouse/significant other               Additional Comments: dtr lives next door and works full time. dtr's husband had TKA ~ 1 wk    Prior Function Level of Independence: Independent with assistive device(s)   Gait / Transfers Assistance Needed: amb with cane PTA           Hand Dominance        Extremity/Trunk Assessment   Upper  Extremity Assessment Upper Extremity Assessment: Generalized weakness    Lower Extremity Assessment Lower Extremity Assessment: RLE deficits/detail RLE Deficits / Details: AAROM  grossly WFL, knee and hip ~ 2+/5       Communication   Communication: HOH  Cognition Arousal/Alertness: Awake/alert Behavior During Therapy: WFL for tasks assessed/performed;Flat affect Overall Cognitive Status: Within Functional Limits for tasks assessed                                        General Comments      Exercises General Exercises -  Lower Extremity Ankle Circles/Pumps: AROM;Both;10 reps   Assessment/Plan    PT Assessment Patient needs continued PT services  PT Problem List Decreased strength;Decreased mobility;Decreased activity tolerance;Decreased balance;Decreased knowledge of use of DME;Pain       PT Treatment Interventions DME instruction;Gait training;Functional mobility training;Therapeutic activities;Patient/family education;Therapeutic exercise    PT Goals (Current goals can be found in the Care Plan section)  Acute Rehab PT Goals Patient Stated Goal: to go home if at all possible PT Goal Formulation: With patient/family Time For Goal Achievement: 09/16/20 Potential to Achieve Goals: Good    Frequency Min 6X/week   Barriers to discharge        Co-evaluation               AM-PAC PT "6 Clicks" Mobility  Outcome Measure Help needed turning from your back to your side while in a flat bed without using bedrails?: A Little Help needed moving from lying on your back to sitting on the side of a flat bed without using bedrails?: A Lot Help needed moving to and from a bed to a chair (including a wheelchair)?: A Lot Help needed standing up from a chair using your arms (e.g., wheelchair or bedside chair)?: A Lot Help needed to walk in hospital room?: A Lot Help needed climbing 3-5 steps with a railing? : Total 6 Click Score: 12    End of Session Equipment Utilized During Treatment: Gait belt Activity Tolerance: Patient tolerated treatment well Patient left: with call bell/phone within reach;in chair;with chair alarm set;with family/visitor present Nurse Communication: Mobility status PT Visit Diagnosis: Difficulty in walking, not elsewhere classified (R26.2)    Time: 3086-5784 PT Time Calculation (min) (ACUTE ONLY): 40 min   Charges:   PT Evaluation $PT Eval Low Complexity: 1 Low PT Treatments $Gait Training: 8-22 mins $Therapeutic Activity: 8-22 mins        Baxter Flattery, PT  Acute Rehab  Dept ([facility]) [phone] Pager [phone]  09/02/2020   [facility] 09/02/2020, 4:55 PM

## 2020-09-02 NOTE — Progress Notes (Signed)
Kristin [name]  MRN: [id number] DOB/Age: December 01, 1940 79 y.o. [facility] Procedure: Procedure(s) (LRB): TOTAL HIP ARTHROPLASTY ANTERIOR APPROACH (Right)     Subjective: Awake and alert, pain controlled and no specific complaints  Vital Signs Temp:  [97.7 F (36.5 C)-98.8 F (37.1 C)] 98.7 F (37.1 C) (11/13 0506) Pulse Rate:  [61-76] 74 (11/13 0506) Resp:  [14-23] 23 (11/13 0506) BP: (131-182)/(63-81) 173/71 (11/13 0506) SpO2:  [95 %-100 %] 100 % (11/13 0506) Weight:  [54 kg] 54 kg (11/12 1207)  Lab Results Recent Labs    09/01/20 0612 09/02/20 0423  WBC 9.8 9.9  HGB 10.0* 9.0*  HCT 31.2* 28.6*  PLT 231 200   BMET Recent Labs    09/01/20 0612 09/02/20 0423  NA 137 135  K 4.4 4.8  CL 101 98  CO2 26 31  GLUCOSE 118* 132*  BUN 25* 25*  CREATININE 1.06* 0.96  CALCIUM 9.8 9.5   INR  Date Value Ref Range Status  02/09/2020 1.3 (H) 0.8 - 1.2 Final    Comment:    (NOTE) INR goal varies based on device and disease states. Performed at [facility], [place] [address], [place], [place] [address]      Exam Right hip dressing with scant old drainage at superior border NVI thigh soft         Plan OK to resume Xarelto today at 10 mg dose then resume home dose tomorrow at 20 mg(I ordered this) Mobilize with PT  Jenetta Loges PA-C  09/02/2020, 8:58 AM Contact # [phone]

## 2020-09-02 NOTE — Progress Notes (Signed)
MEDICATION RELATED CONSULT NOTE - INITIAL   Pharmacy Consult for Xarelto Indication: fracture care post-operative anticoagulation per pharmacy consult   Allergies  Allergen Reactions  . Oxycodone Other (See Comments)    Not tolerate well  . Doxycycline Nausea Only    Weak, stomach  . Sulfa Antibiotics Rash    Patient Measurements: Height: 5\' 8"  (172.7 cm) Weight: 54 kg (119 lb) IBW/kg (Calculated) : 63.9 Adjusted Body Weight:  Vital Signs: Temp: 98.7 F (37.1 C) (11/13 0506) Temp Source: Oral (11/13 0506) BP: 173/71 (11/13 0506) Pulse Rate: 74 (11/13 0506) Intake/Output from previous day: 11/12 0701 - 11/13 0700 In: 1950 [I.V.:1300; IV Piggyback:650] Out: 660 [Urine:360; Blood:300] Intake/Output from this shift: Total I/O In: 360 [P.O.:360] Out: -   Labs: Recent Labs    08/30/20 1700 08/30/20 1700 08/31/20 0625 09/01/20 0612 09/02/20 0423  WBC 11.9*   < > 8.7 9.8 9.9  HGB 11.7*   < > 9.1* 10.0* 9.0*  HCT 36.1   < > 28.8* 31.2* 28.6*  PLT 268   < > 213 231 200  CREATININE 1.19*  --   --  1.06* 0.96  ALBUMIN  --   --   --  3.1*  --   PROT  --   --   --  5.9*  --   AST  --   --   --  14*  --   ALT  --   --   --  15  --   ALKPHOS  --   --   --  78  --   BILITOT  --   --   --  0.7  --    < > = values in this interval not displayed.   Estimated Creatinine Clearance: 39.2 mL/min (by C-G formula based on SCr of 0.96 mg/dL).   Assessment: 63 yoF admitted on 11/10 with closed displaced fracture of the right femoral neck.  She is status post right total hip arthroplasty by Dr. Lyla Glassing on 11/12.  Pharmacy is consulted to resume PTA Xarelto on POD#1 if Hgb > 7. PTA Xarelto 20 mg PO daily for history of Afib.  09/02/20, POD#1 Hgb 9 SCr 0.96, CrCl ~ 39 ml/min based on total body weight No bleeding or complications noted  Plan:  Per Ortho, resume Xarelto today at 10 mg dose then resume home dose 20 mg tomorrow However, due to CrCl in the 15-50 ml/min range,  Xarelto dose should be reduced to 15 mg daily.  Per Dr. Cruzita Lederer, change to Xarelto 15 mg daily starting tomorrow.  Gretta Arab PharmD, BCPS Clinical Pharmacist [facility] main pharmacy [phone] 09/02/2020 12:31 PM

## 2020-09-02 NOTE — Progress Notes (Signed)
PROGRESS NOTE  Kristin [name] IRJ:[id number] DOB: 02-Oct-1941 DOA: 08/30/2020 PCP: Eber Hong, MD   LOS: 3 days   Brief Narrative / Interim history: 79 year old female with lung adenocarcinoma, history of breast and colon cancer, followed by oncology, paroxysmal A. fib on Xarelto, HTN, anemia of chronic disease, chronic kidney disease stage III, PE and DVT comes into the hospital after having a fall at home.  She stood up, had some dizziness. Imaging in the ER shows closed displaced fracture of the right femoral neck.  Orthopedic surgery consulted and patient was admitted to the hospitalist service.  Subjective / 24h Interval events: No chest pain, no shortness of breath, still has intermittent nausea but she tells me this is something she has had at home also  Assessment & Plan: Principal Problem Closed displaced fracture of the right femoral neck -Orthopedic surgery was consulted, patient was taken to the operating room on 11/12 and she is status post right total hip arthroplasty by Dr. Lyla Glassing -Awaiting PT evaluation. -Resume Xarelto postop per orthopedic surgery.  Would appreciate also input from orthopedic surgery when Plavix can be resumed  Active Problems Essential hypertension -Continue furosemide  Paroxysmal A. Fib -Continue amiodarone, hold Xarelto  History of DVT/PE  -Hold Xarelto, resume postop when cleared by orthopedic surgery  Adenocarcinoma of the lung, personal history of breast cancer -Outpatient management  Chronic kidney disease stage IIIb -Baseline creatinine 1.2, currently at baseline 0.96 this morning  Chronic anemia, acute blood loss anemia -Monitor postoperatively, 9.0 this morning, no need for blood transfusions   Scheduled Meds: . amiodarone  200 mg Oral Daily  . brimonidine  1 drop Right Eye BID   And  . timolol  1 drop Right Eye BID  . Chlorhexidine Gluconate Cloth  6 each Topical Daily  . clopidogrel  75 mg Oral Daily  . cycloSPORINE  1  drop Both Eyes BID  . docusate sodium  100 mg Oral BID  . escitalopram  10 mg Oral Daily  . famotidine  40 mg Oral BID  . feeding supplement  237 mL Oral BID BM  . feeding supplement (KATE FARMS STANDARD 1.4)  325 mL Oral Daily  . furosemide  10 mg Oral Daily  . influenza vaccine adjuvanted  0.5 mL Intramuscular Tomorrow-1000  . letrozole  2.5 mg Oral Daily  . nutrition supplement (JUVEN)  1 packet Oral BID BM  . osimertinib mesylate  80 mg Oral Daily  . [START ON 09/03/2020] rivaroxaban  20 mg Oral Daily   Continuous Infusions:  PRN Meds:.HYDROmorphone (DILAUDID) injection, menthol-cetylpyridinium **OR** phenol, metoCLOPramide **OR** metoCLOPramide (REGLAN) injection, ondansetron **OR** ondansetron (ZOFRAN) IV, senna-docusate, traMADol  Diet Orders (From admission, onward)    Start     Ordered   09/01/20 1650  Diet regular Room service appropriate? Yes; Fluid consistency: Thin  Diet effective now       Question Answer Comment  Room service appropriate? Yes   Fluid consistency: Thin      09/01/20 1650          DVT prophylaxis: SCDs Start: 09/01/20 1650 SCDs Start: 08/30/20 2127     Code Status: Full Code  Family Communication: No family at bedside this morning  Status is: Inpatient  Remains inpatient appropriate because:Ongoing diagnostic testing needed not appropriate for outpatient work up and Inpatient level of care appropriate due to severity of illness   Dispo: The patient is from: Home              Anticipated d/c  is to: SNF              Anticipated d/c date is: 3 days              Patient currently is not medically stable to d/c.  Consultants:  Orthopedic surgery   Procedures:  None   Microbiology  None   Antimicrobials: None     Objective: Vitals:   09/01/20 1650 09/01/20 1835 09/01/20 2057 09/02/20 0506  BP: (!) 182/65 (!) 163/63 (!) 168/72 (!) 173/71  Pulse: 65 69 70 74  Resp:   16 (!) 23  Temp: 98 F (36.7 C)  98.6 F (37 C) 98.7 F  (37.1 C)  TempSrc: Oral  Oral Oral  SpO2: 100% 100% 100% 100%  Weight:      Height:        Intake/Output Summary (Last 24 hours) at 09/02/2020 1131 Last data filed at 09/02/2020 1610 Gross per 24 hour  Intake 2310 ml  Output 660 ml  Net 1650 ml   Filed Weights   09/01/20 1207  Weight: 54 kg    Examination:  Constitutional: No distress, in bed Eyes: No scleral icterus ENMT: Moist mucous membranes Neck: normal, supple Respiratory: Lungs are clear to auscultation bilaterally, no wheezing, no crackles Cardiovascular: Regular rate and rhythm, no murmurs, no edema Abdomen: Abdomen nondistended, bowel sounds positive Musculoskeletal: no clubbing / cyanosis.  Skin: No rashes appreciated, dressing intact on the right hip Neurologic: No focal deficits, equal strength, sensation equal bilateral lower extremities   Data Reviewed: I have independently reviewed following labs and imaging studies   CBC: Recent Labs  Lab 08/30/20 1700 08/31/20 0625 09/01/20 0612 09/02/20 0423  WBC 11.9* 8.7 9.8 9.9  HGB 11.7* 9.1* 10.0* 9.0*  HCT 36.1 28.8* 31.2* 28.6*  MCV 97.6 98.3 98.1 98.6  PLT 268 213 231 960   Basic Metabolic Panel: Recent Labs  Lab 08/30/20 1700 09/01/20 0612 09/02/20 0423  NA 136 137 135  K 4.2 4.4 4.8  CL 102 101 98  CO2 25 26 31   GLUCOSE 117* 118* 132*  BUN 21 25* 25*  CREATININE 1.19* 1.06* 0.96  CALCIUM 9.8 9.8 9.5   Liver Function Tests: Recent Labs  Lab 09/01/20 0612  AST 14*  ALT 15  ALKPHOS 78  BILITOT 0.7  PROT 5.9*  ALBUMIN 3.1*   Coagulation Profile: No results for input(s): INR, PROTIME in the last 168 hours. HbA1C: No results for input(s): HGBA1C in the last 72 hours. CBG: Recent Labs  Lab 08/30/20 1617  GLUCAP 108*    Recent Results (from the past 240 hour(s))  Respiratory Panel by RT PCR (Flu A&B, Covid) - Nasopharyngeal Swab     Status: None   Collection Time: 08/30/20  6:46 PM   Specimen: Nasopharyngeal Swab  Result  Value Ref Range Status   SARS Coronavirus 2 by RT PCR NEGATIVE NEGATIVE Final    Comment: (NOTE) SARS-CoV-2 target nucleic acids are NOT DETECTED.  The SARS-CoV-2 RNA is generally detectable in upper respiratoy specimens during the acute phase of infection. The lowest concentration of SARS-CoV-2 viral copies this assay can detect is 131 copies/mL. A negative result does not preclude SARS-Cov-2 infection and should not be used as the sole basis for treatment or other patient management decisions. A negative result may occur with  improper specimen collection/handling, submission of specimen other than nasopharyngeal swab, presence of viral mutation(s) within the areas targeted by this assay, and inadequate number of viral copies (<131 copies/mL).  A negative result must be combined with clinical observations, patient history, and epidemiological information. The expected result is Negative.  Fact Sheet for Patients:  PinkCheek.be  Fact Sheet for Healthcare Providers:  GravelBags.it  This test is no t yet approved or cleared by the [place] FDA and  has been authorized for detection and/or diagnosis of SARS-CoV-2 by FDA under an Emergency Use Authorization (EUA). This EUA will remain  in effect (meaning this test can be used) for the duration of the COVID-19 declaration under Section 564(b)(1) of the Act, 21 U.S.C. section 360bbb-3(b)(1), unless the authorization is terminated or revoked sooner.     Influenza A by PCR NEGATIVE NEGATIVE Final   Influenza B by PCR NEGATIVE NEGATIVE Final    Comment: (NOTE) The Xpert Xpress SARS-CoV-2/FLU/RSV assay is intended as an aid in  the diagnosis of influenza from Nasopharyngeal swab specimens and  should not be used as a sole basis for treatment. Nasal washings and  aspirates are unacceptable for Xpert Xpress SARS-CoV-2/FLU/RSV  testing.  Fact Sheet for  Patients: PinkCheek.be  Fact Sheet for Healthcare Providers: GravelBags.it  This test is not yet approved or cleared by the [place] FDA and  has been authorized for detection and/or diagnosis of SARS-CoV-2 by  FDA under an Emergency Use Authorization (EUA). This EUA will remain  in effect (meaning this test can be used) for the duration of the  Covid-19 declaration under Section 564(b)(1) of the Act, 21  U.S.C. section 360bbb-3(b)(1), unless the authorization is  terminated or revoked. Performed at [facility], [place] [address], [place], [place] [address]      Radiology Studies: Pelvis Portable  Result Date: 09/01/2020 CLINICAL DATA:  Right total hip arthroplasty EXAM: PORTABLE PELVIS 1-2 VIEWS COMPARISON:  08/30/2020 FINDINGS: Frontal view of the pelvis including both hips demonstrates bilateral hip arthroplasties in the expected position without signs of acute complication. Postsurgical changes are seen overlying the right hip. IMPRESSION: 1. Bilateral hip arthroplasties as above. Electronically Signed   By: Randa Ngo M.D.   On: 09/01/2020 17:15   DG C-Arm 1-60 Min-No Report  Result Date: 09/01/2020 Fluoroscopy was utilized by the requesting physician.  No radiographic interpretation.   DG HIP OPERATIVE UNILAT W OR W/O PELVIS RIGHT  Result Date: 09/01/2020 CLINICAL DATA:  Known right hip fracture EXAM: OPERATIVE RIGHT HIP WITH PELVIS COMPARISON:  08/30/2020 FLUOROSCOPY TIME:  Radiation Exposure Index (as provided by the fluoroscopic device): 0.95 mGy If the device does not provide the exposure index: Fluoroscopy Time:  6 seconds Number of Acquired Images:  4 FINDINGS: Initial images again demonstrate the subcapital femoral neck fracture with impaction. Right hip replacement is subsequently placed in satisfactory position. No acute bony or soft tissue abnormality is seen. IMPRESSION: Status post  right hip replacement Electronically Signed   By: Inez Catalina M.D.   On: 09/01/2020 16:05    Marzetta Board, MD, PhD [facility]  Between 7 am - 7 pm I am available, please contact me via Amion or Securechat  Between 7 pm - 7 am I am not available, please contact night coverage MD/APP via Amion

## 2020-09-03 DIAGNOSIS — N1832 Chronic kidney disease, stage 3b: Secondary | ICD-10-CM | POA: Diagnosis not present

## 2020-09-03 DIAGNOSIS — S72001A Fracture of unspecified part of neck of right femur, initial encounter for closed fracture: Secondary | ICD-10-CM | POA: Diagnosis not present

## 2020-09-03 DIAGNOSIS — C3491 Malignant neoplasm of unspecified part of right bronchus or lung: Secondary | ICD-10-CM | POA: Diagnosis not present

## 2020-09-03 LAB — BASIC METABOLIC PANEL
Anion gap: 7 (ref 5–15)
BUN: 35 mg/dL — ABNORMAL HIGH (ref 8–23)
CO2: 30 mmol/L (ref 22–32)
Calcium: 9.3 mg/dL (ref 8.9–10.3)
Chloride: 100 mmol/L (ref 98–111)
Creatinine, Ser: 0.9 mg/dL (ref 0.44–1.00)
GFR, Estimated: >60 mL/min (ref >60)
Glucose, Bld: 107 mg/dL — ABNORMAL HIGH (ref 70–99)
Potassium: 4.4 mmol/L (ref 3.5–5.1)
Sodium: 137 mmol/L (ref 135–145)

## 2020-09-03 LAB — CBC
HCT: 24.5 % — ABNORMAL LOW (ref 36.0–46.0)
Hemoglobin: 7.6 g/dL — ABNORMAL LOW (ref 12.0–15.0)
MCH: 30.8 pg (ref 26.0–34.0)
MCHC: 31 g/dL (ref 30.0–36.0)
MCV: 99.2 fL (ref 80.0–100.0)
Platelets: 209 10*3/uL (ref 150–400)
RBC: 2.47 MIL/uL — ABNORMAL LOW (ref 3.87–5.11)
RDW: 13.3 % (ref 11.5–15.5)
WBC: 6.5 10*3/uL (ref 4.0–10.5)
nRBC: 0 % (ref 0.0–0.2)

## 2020-09-03 LAB — PREPARE RBC (CROSSMATCH)

## 2020-09-03 MED ORDER — SODIUM CHLORIDE 0.9% IV SOLUTION: Freq: Once | INTRAVENOUS | Status: AC

## 2020-09-03 NOTE — Progress Notes (Signed)
Physical Therapy Treatment Patient Details Name: Kristin [name] MRN: [id number] DOB: 1941/02/12 Today's Date: 09/03/2020    History of Present Illness 79 year old female with lung adenocarcinoma, history of breast and colon cancer, followed by oncology, paroxysmal A. fib on Xarelto, HTN, anemia of chronic disease, chronic kidney disease stage III, PE and DVT comes into the hospital after having a fall at home.  She stood up, had some dizziness. Imaging in the ER shows closed displaced fracture of the right femoral neck.  Orthopedic surgery consulted and patient was admitted to the hospitalist service. Pt underwent DA THA for fixation of R femoral neck fx.    PT Comments    Pt requires much encouragement but is agreeable to OOB and exercises. Husband trying to get pt to eat. Continue PT POC  Follow Up Recommendations  Home health PT;Supervision/Assistance - 24 hour (HHOT)     Equipment Recommendations  3in1 (PT)    Recommendations for Other Services       Precautions / Restrictions Precautions Precautions: Fall Restrictions Weight Bearing Restrictions: No Other Position/Activity Restrictions: WBAT    Mobility  Bed Mobility Overal bed mobility: Needs Assistance Bed Mobility: Supine to Sit     Supine to sit: Mod assist     General bed mobility comments: assist with LEs and trunk  Transfers Overall transfer level: Needs assistance Equipment used: Rolling walker (2 wheeled) Transfers: Sit to/from [facility] Sit to Stand: Min assist Stand pivot transfers: Min assist       General transfer comment: assist to rise and transiton to RW, assist to steady with stand pivot   Ambulation/Gait             General Gait Details: pt declined stating she just "wanted to sit"   Stairs             Wheelchair Mobility    Modified Rankin (Stroke Patients Only)       Balance   Sitting-balance support: Single extremity supported;Feet  supported Sitting balance-Leahy Scale: Fair       Standing balance-Leahy Scale: Poor Standing balance comment: reliant on UEs                             Cognition Arousal/Alertness: Awake/alert Behavior During Therapy: WFL for tasks assessed/performed;Flat affect Overall Cognitive Status: Within Functional Limits for tasks assessed                                 General Comments: pt seems less interactive today, awake but appears tired.       Exercises Total Joint Exercises Ankle Circles/Pumps: AROM;Left;10 reps Quad Sets: AROM;Both;5 reps Heel Slides: AAROM;Right;10 reps Hip ABduction/ADduction: AAROM;Right;10 reps    General Comments        Pertinent Vitals/Pain Pain Assessment: Faces Faces Pain Scale: Hurts even more Pain Location: right hip Pain Descriptors / Indicators: Grimacing;Guarding Pain Intervention(s): Limited activity within patient's tolerance;Monitored during session;Other (comment);Ice applied (asked RN to premed, did not d/t stating pt drowsy)    Home Living                      Prior Function            PT Goals (current goals can now be found in the care plan section) Acute Rehab PT Goals Patient Stated Goal: to go home if at all possible PT Goal  Formulation: With patient/family Time For Goal Achievement: 09/16/20 Potential to Achieve Goals: Good Progress towards PT goals: Progressing toward goals    Frequency    Min 6X/week      PT Plan Current plan remains appropriate    Co-evaluation              AM-PAC PT "6 Clicks" Mobility   Outcome Measure  Help needed turning from your back to your side while in a flat bed without using bedrails?: A Little Help needed moving from lying on your back to sitting on the side of a flat bed without using bedrails?: A Lot Help needed moving to and from a bed to a chair (including a wheelchair)?: A Little Help needed standing up from a chair using your  arms (e.g., wheelchair or bedside chair)?: A Little Help needed to walk in hospital room?: A Lot Help needed climbing 3-5 steps with a railing? : A Lot 6 Click Score: 15    End of Session Equipment Utilized During Treatment: Gait belt Activity Tolerance: Patient tolerated treatment well Patient left: in chair;with call bell/phone within reach;with chair alarm set;with family/visitor present   PT Visit Diagnosis: Difficulty in walking, not elsewhere classified (R26.2)     Time: 3729-0211 PT Time Calculation (min) (ACUTE ONLY): 32 min  Charges:  $Therapeutic Exercise: 8-22 mins $Therapeutic Activity: 8-22 mins                     Baxter Flattery, PT  Acute Rehab Dept ([place]) [phone] Pager [phone]  09/03/2020    [facility] 09/03/2020, 2:58 PM

## 2020-09-03 NOTE — Progress Notes (Signed)
PROGRESS NOTE  Kristin [name] XLK:[id number] DOB: 1940/11/17 DOA: 08/30/2020 PCP: Eber Hong, MD   LOS: 4 days   Brief Narrative / Interim history: 79 year old female with lung adenocarcinoma, history of breast and colon cancer, followed by oncology, paroxysmal A. fib on Xarelto, HTN, anemia of chronic disease, chronic kidney disease stage III, PE and DVT comes into the hospital after having a fall at home.  She stood up, had some dizziness. Imaging in the ER shows closed displaced fracture of the right femoral neck.  Orthopedic surgery consulted and patient was admitted to the hospitalist service.  Subjective / 24h Interval events: She feels well today, denies any more nausea.  No chest pain, no abdominal pain, no vomiting.  No shortness of breath.  Worked a little bit yesterday with physical therapy  Assessment & Plan: Principal Problem Closed displaced fracture of the right femoral neck -Orthopedic surgery was consulted, patient was taken to the operating room on 11/12 and she is status post right total hip arthroplasty by Dr. Lyla Glassing -Awaiting PT evaluation. -Back on Xarelto, awaiting Ortho input about when Plavix can be resumed  Active Problems Essential hypertension -Continue furosemide  Paroxysmal A. Fib -Continue amiodarone, now back on Xarelto  History of DVT/PE  -Continue Xarelto  Adenocarcinoma of the lung, personal history of breast cancer -Outpatient management  Chronic kidney disease stage IIIb -Baseline creatinine 1.2, currently at baseline 0.90  Chronic anemia, acute blood loss anemia -Monitor postoperatively, 7.6 this morning, would favor to transfuse a unit of packed red blood cells  History of adenocarcinoma of the ascending colon in 2017-status post right colectomy with lymph node dissection.  Outpatient follow-up  Scheduled Meds: . amiodarone  200 mg Oral Daily  . brimonidine  1 drop Right Eye BID   And  . timolol  1 drop Right Eye BID  .  Chlorhexidine Gluconate Cloth  6 each Topical Daily  . clopidogrel  75 mg Oral Daily  . cycloSPORINE  1 drop Both Eyes BID  . docusate sodium  100 mg Oral BID  . escitalopram  10 mg Oral Daily  . famotidine  40 mg Oral BID  . feeding supplement  237 mL Oral BID BM  . feeding supplement (KATE FARMS STANDARD 1.4)  325 mL Oral Daily  . furosemide  10 mg Oral Daily  . influenza vaccine adjuvanted  0.5 mL Intramuscular Tomorrow-1000  . letrozole  2.5 mg Oral Daily  . nutrition supplement (JUVEN)  1 packet Oral BID BM  . osimertinib mesylate  80 mg Oral Daily  . rivaroxaban  15 mg Oral Daily   Continuous Infusions:  PRN Meds:.HYDROmorphone (DILAUDID) injection, menthol-cetylpyridinium **OR** phenol, metoCLOPramide **OR** metoCLOPramide (REGLAN) injection, ondansetron **OR** ondansetron (ZOFRAN) IV, senna-docusate, traMADol  Diet Orders (From admission, onward)    Start     Ordered   09/01/20 1650  Diet regular Room service appropriate? Yes; Fluid consistency: Thin  Diet effective now       Question Answer Comment  Room service appropriate? Yes   Fluid consistency: Thin      09/01/20 1650          DVT prophylaxis: SCDs Start: 09/01/20 1650 SCDs Start: 08/30/20 2127     Code Status: Full Code  Family Communication: Husband present at bedside  Status is: Inpatient  Remains inpatient appropriate because:Ongoing diagnostic testing needed not appropriate for outpatient work up and Inpatient level of care appropriate due to severity of illness   Dispo: The patient is from: Home  Anticipated d/c is to: SNF              Anticipated d/c date is: 3 days              Patient currently is not medically stable to d/c.  Consultants:  Orthopedic surgery   Procedures:  None   Microbiology  None   Antimicrobials: None     Objective: Vitals:   09/02/20 0506 09/02/20 1408 09/02/20 2042 09/03/20 0500  BP: (!) 173/71 123/72 (!) 121/54 131/60  Pulse: 74 78 77 70    Resp: (!) 23 18 14  (!) 21  Temp: 98.7 F (37.1 C) 98.1 F (36.7 C) 98.5 F (36.9 C) 98.2 F (36.8 C)  TempSrc: Oral  Oral Oral  SpO2: 100% 100% 100% 93%  Weight:      Height:        Intake/Output Summary (Last 24 hours) at 09/03/2020 1024 Last data filed at 09/03/2020 0900 Gross per 24 hour  Intake 240 ml  Output 450 ml  Net -210 ml   Filed Weights   09/01/20 1207  Weight: 54 kg    Examination:  Constitutional: No distress, in bed Eyes: No scleral icterus ENMT: mmm Neck: normal, supple Respiratory: Lungs are clear bilaterally, no wheezing, no crackles heard Cardiovascular: Regular rate and rhythm, no murmurs, no peripheral edema Abdomen: Nondistended, bowel sounds positive Musculoskeletal: no clubbing / cyanosis.  Skin: No rashes appreciated Neurologic: Nonfocal, equal strength   Data Reviewed: I have independently reviewed following labs and imaging studies   CBC: Recent Labs  Lab 08/30/20 1700 08/31/20 0625 09/01/20 0612 09/02/20 0423 09/03/20 0338  WBC 11.9* 8.7 9.8 9.9 6.5  HGB 11.7* 9.1* 10.0* 9.0* 7.6*  HCT 36.1 28.8* 31.2* 28.6* 24.5*  MCV 97.6 98.3 98.1 98.6 99.2  PLT 268 213 231 200 829   Basic Metabolic Panel: Recent Labs  Lab 08/30/20 1700 09/01/20 0612 09/02/20 0423 09/03/20 0338  NA 136 137 135 137  K 4.2 4.4 4.8 4.4  CL 102 101 98 100  CO2 25 26 31 30   GLUCOSE 117* 118* 132* 107*  BUN 21 25* 25* 35*  CREATININE 1.19* 1.06* 0.96 0.90  CALCIUM 9.8 9.8 9.5 9.3   Liver Function Tests: Recent Labs  Lab 09/01/20 0612  AST 14*  ALT 15  ALKPHOS 78  BILITOT 0.7  PROT 5.9*  ALBUMIN 3.1*   Coagulation Profile: No results for input(s): INR, PROTIME in the last 168 hours. HbA1C: No results for input(s): HGBA1C in the last 72 hours. CBG: Recent Labs  Lab 08/30/20 1617  GLUCAP 108*    Recent Results (from the past 240 hour(s))  Respiratory Panel by RT PCR (Flu A&B, Covid) - Nasopharyngeal Swab     Status: None   Collection  Time: 08/30/20  6:46 PM   Specimen: Nasopharyngeal Swab  Result Value Ref Range Status   SARS Coronavirus 2 by RT PCR NEGATIVE NEGATIVE Final    Comment: (NOTE) SARS-CoV-2 target nucleic acids are NOT DETECTED.  The SARS-CoV-2 RNA is generally detectable in upper respiratoy specimens during the acute phase of infection. The lowest concentration of SARS-CoV-2 viral copies this assay can detect is 131 copies/mL. A negative result does not preclude SARS-Cov-2 infection and should not be used as the sole basis for treatment or other patient management decisions. A negative result may occur with  improper specimen collection/handling, submission of specimen other than nasopharyngeal swab, presence of viral mutation(s) within the areas targeted by this assay, and inadequate  number of viral copies (<131 copies/mL). A negative result must be combined with clinical observations, patient history, and epidemiological information. The expected result is Negative.  Fact Sheet for Patients:  PinkCheek.be  Fact Sheet for Healthcare Providers:  GravelBags.it  This test is no t yet approved or cleared by the [place] FDA and  has been authorized for detection and/or diagnosis of SARS-CoV-2 by FDA under an Emergency Use Authorization (EUA). This EUA will remain  in effect (meaning this test can be used) for the duration of the COVID-19 declaration under Section 564(b)(1) of the Act, 21 U.S.C. section 360bbb-3(b)(1), unless the authorization is terminated or revoked sooner.     Influenza A by PCR NEGATIVE NEGATIVE Final   Influenza B by PCR NEGATIVE NEGATIVE Final    Comment: (NOTE) The Xpert Xpress SARS-CoV-2/FLU/RSV assay is intended as an aid in  the diagnosis of influenza from Nasopharyngeal swab specimens and  should not be used as a sole basis for treatment. Nasal washings and  aspirates are unacceptable for Xpert Xpress  SARS-CoV-2/FLU/RSV  testing.  Fact Sheet for Patients: PinkCheek.be  Fact Sheet for Healthcare Providers: GravelBags.it  This test is not yet approved or cleared by the [place] FDA and  has been authorized for detection and/or diagnosis of SARS-CoV-2 by  FDA under an Emergency Use Authorization (EUA). This EUA will remain  in effect (meaning this test can be used) for the duration of the  Covid-19 declaration under Section 564(b)(1) of the Act, 21  U.S.C. section 360bbb-3(b)(1), unless the authorization is  terminated or revoked. Performed at [facility], [place] [address], [place], [place] [address]      Radiology Studies: No results found.  Marzetta Board, MD, PhD [facility]  Between 7 am - 7 pm I am available, please contact me via Amion or Securechat  Between 7 pm - 7 am I am not available, please contact night coverage MD/APP via Amion

## 2020-09-03 NOTE — Progress Notes (Signed)
   Subjective: 2 Days Post-Op Procedure(s) (LRB): TOTAL HIP ARTHROPLASTY ANTERIOR APPROACH (Right) Patient reports pain as mild.   No complaints of dizziness or lightheadedness with standing yesterday  Objective: Vital signs in last 24 hours: Temp:  [98.1 F (36.7 C)-98.5 F (36.9 C)] 98.2 F (36.8 C) (11/14 0500) Pulse Rate:  [70-78] 70 (11/14 0500) Resp:  [14-21] 21 (11/14 0500) BP: (121-131)/(54-72) 131/60 (11/14 0500) SpO2:  [93 %-100 %] 93 % (11/14 0500)  Intake/Output from previous day:  Intake/Output Summary (Last 24 hours) at 09/03/2020 0752 Last data filed at 09/03/2020 0500 Gross per 24 hour  Intake 600 ml  Output 450 ml  Net 150 ml    Intake/Output this shift: No intake/output data recorded.  Labs: Recent Labs    09/01/20 0612 09/02/20 0423 09/03/20 0338  HGB 10.0* 9.0* 7.6*   Recent Labs    09/02/20 0423 09/03/20 0338  WBC 9.9 6.5  RBC 2.90* 2.47*  HCT 28.6* 24.5*  PLT 200 209   Recent Labs    09/02/20 0423 09/03/20 0338  NA 135 137  K 4.8 4.4  CL 98 100  CO2 31 30  BUN 25* 35*  CREATININE 0.96 0.90  GLUCOSE 132* 107*  CALCIUM 9.5 9.3   No results for input(s): LABPT, INR in the last 72 hours.  EXAM General - Patient is Alert, Appropriate and Oriented Extremity - NO edema; neurovascular intact Dressing/Incision - Minimal bloody drainage at superior aspect of bandage Motor Function - intact, moving foot and toes well on exam.   Past Medical History:  Diagnosis Date  . Allergy   . Anemia   . Anemia of chronic disease 09/21/2016  . Anxiety   . Arthritis    arthritis- left hip  . Blood transfusion without reported diagnosis    transfusion- 3-4 yrs ago -found to be anemic on routine lab check  . Breast cancer ([place]) 2002   bilateral- bilateral mastectomies done.  . Chronic kidney disease   . 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 CVA (cerebral infarction) 04/30/2013  . Dysrhythmia   . Encounter for antineoplastic chemotherapy 10/28/2016    . Family history of brain cancer   . Family history of breast cancer   . Family history of colon cancer 10/06/2009  . Family history of colon cancer   . Family history of prostate cancer   . GERD (gastroesophageal reflux disease)   . Glaucoma   . History of bilateral mastectomy 05/20/2010  . Hypertension   . Hypothyroidism   . Lung cancer ([place]) dx'd 12/2010   last Ct scan "no lung cancer" showing 11'16 CT Chest Epic.  . Malignant neoplasm of ascending colon ([place]) 01/29/2016  . Stroke [facility])    2 yrs ago-no residual  . Tubular adenoma of colon 07/2011   colon polyps ans reoccurence with malignancy found    Assessment/Plan: 2 Days Post-Op Procedure(s) (LRB): TOTAL HIP ARTHROPLASTY ANTERIOR APPROACH (Right) Principal Problem:   Closed displaced fracture of right femoral neck (HCC) Active Problems:   Personal history of breast cancer   Hypertension   Adenocarcinoma of lung (HCC)   CKD (chronic kidney disease), stage III (HCC)   Paroxysmal atrial fibrillation ([place])   Fall at home, initial encounter   Fracture of femoral neck, right, closed ([place])   Up with therapy  Hemoglobin is 7.6 but BP and pulse are normal. Will consider transfusion if she develops symptoms DVT Prophylaxis - Xarelto Weight Bearing As Tolerated right Leg  Gaynelle Arabian 09/03/2020, 7:52 AM

## 2020-09-04 DIAGNOSIS — C3491 Malignant neoplasm of unspecified part of right bronchus or lung: Secondary | ICD-10-CM | POA: Diagnosis not present

## 2020-09-04 DIAGNOSIS — N1832 Chronic kidney disease, stage 3b: Secondary | ICD-10-CM | POA: Diagnosis not present

## 2020-09-04 DIAGNOSIS — S72001A Fracture of unspecified part of neck of right femur, initial encounter for closed fracture: Secondary | ICD-10-CM | POA: Diagnosis not present

## 2020-09-04 LAB — BPAM RBC
Blood Product Expiration Date: 202112112359
ISSUE DATE / TIME: 202111141216
Unit Type and Rh: 7300

## 2020-09-04 LAB — CBC
HCT: 30.2 % — ABNORMAL LOW (ref 36.0–46.0)
Hemoglobin: 9.8 g/dL — ABNORMAL LOW (ref 12.0–15.0)
MCH: 30.2 pg (ref 26.0–34.0)
MCHC: 32.5 g/dL (ref 30.0–36.0)
MCV: 92.9 fL (ref 80.0–100.0)
Platelets: 225 10*3/uL (ref 150–400)
RBC: 3.25 MIL/uL — ABNORMAL LOW (ref 3.87–5.11)
RDW: 15.3 % (ref 11.5–15.5)
WBC: 7.2 10*3/uL (ref 4.0–10.5)
nRBC: 0 % (ref 0.0–0.2)

## 2020-09-04 LAB — COMPREHENSIVE METABOLIC PANEL
ALT: 18 U/L (ref 0–44)
AST: 25 U/L (ref 15–41)
Albumin: 2.6 g/dL — ABNORMAL LOW (ref 3.5–5.0)
Alkaline Phosphatase: 92 U/L (ref 38–126)
Anion gap: 7 (ref 5–15)
BUN: 28 mg/dL — ABNORMAL HIGH (ref 8–23)
CO2: 29 mmol/L (ref 22–32)
Calcium: 9.2 mg/dL (ref 8.9–10.3)
Chloride: 99 mmol/L (ref 98–111)
Creatinine, Ser: 0.89 mg/dL (ref 0.44–1.00)
GFR, Estimated: >60 mL/min (ref >60)
Glucose, Bld: 109 mg/dL — ABNORMAL HIGH (ref 70–99)
Potassium: 4.2 mmol/L (ref 3.5–5.1)
Sodium: 135 mmol/L (ref 135–145)
Total Bilirubin: 0.6 mg/dL (ref 0.3–1.2)
Total Protein: 5.4 g/dL — ABNORMAL LOW (ref 6.5–8.1)

## 2020-09-04 LAB — TYPE AND SCREEN
ABO/RH(D): B POS
Antibody Screen: NEGATIVE
Unit division: 0

## 2020-09-04 MED ORDER — TRAMADOL HCL 50 MG PO TABS: 50.0000 mg | ORAL_TABLET | ORAL | 0 refills | Status: DC | PRN

## 2020-09-04 NOTE — Progress Notes (Signed)
Patient BP 173/71 not taken at rest. Patient c/o pain, administered IV dilaudid. Will notify on-coming nurse for follow-up. Will continue to monitor the patient.

## 2020-09-04 NOTE — Care Management Important Message (Signed)
Important Message  Patient Details IM Letter given to the Patient. Name: Kristin [name] MRN: [id number] Date of Birth: 09/09/1941   Medicare Important Message Given:  Yes     Kerin Salen 09/04/2020, 11:02 AM

## 2020-09-04 NOTE — Progress Notes (Signed)
Physical Therapy Treatment Patient Details Name: Kristin [name] MRN: [id number] DOB: October 10, 1941 Today's Date: 09/04/2020    History of Present Illness 79 year old female with lung adenocarcinoma, history of breast and colon cancer, followed by oncology, paroxysmal A. fib on Xarelto, HTN, anemia of chronic disease, chronic kidney disease stage III, PE and DVT comes into the hospital after having a fall at home.  She stood up, had some dizziness. Imaging in the ER shows closed displaced fracture of the right femoral neck.  Orthopedic surgery consulted and patient was admitted to the hospitalist service. Pt underwent DA THA for fixation of R femoral neck fx.    PT Comments    Pt is progressing toward goals. Much improvement from yesterday. Was able to amb into hallway today (~34'). dtr educated on assisting, utilizing gait belt for safety. Dtr reports pt was not extremely active prior to this fx. Continue to recommend HHPT at d/c     Follow Up Recommendations  Home health PT;Supervision/Assistance - 24 hour     Equipment Recommendations  None recommended by PT    Recommendations for Other Services       Precautions / Restrictions Precautions Precautions: Fall Restrictions RLE Weight Bearing: Weight bearing as tolerated    Mobility  Bed Mobility Overal bed mobility: Needs Assistance Bed Mobility: Supine to Sit     Supine to sit: Min guard     General bed mobility comments: incr time, cues for technique,  instructed in use of gait belt as leg lifter   Transfers Overall transfer level: Needs assistance Equipment used: Rolling walker (2 wheeled) Transfers: Sit to/from Stand Sit to Stand: Min assist         General transfer comment: assist to rise and transiton to RW  Ambulation/Gait Ambulation/Gait assistance: Min Web designer (Feet): 34 Feet Assistive device: Rolling walker (2 wheeled) Gait Pattern/deviations: Step-to pattern;Decreased step length -  right;Decreased step length - left     General Gait Details: cues for sequence and RW position, min assist to steady although no overt LOB    Stairs             Wheelchair Mobility    Modified Rankin (Stroke Patients Only)       Balance             Standing balance-Leahy Scale: Poor Standing balance comment: reliant on UEs                             Cognition Arousal/Alertness: Awake/alert Behavior During Therapy: WFL for tasks assessed/performed;Flat affect Overall Cognitive Status: Within Functional Limits for tasks assessed                                        Exercises Total Joint Exercises Ankle Circles/Pumps: AROM;Left;10 reps Quad Sets: AROM;Both;10 reps Heel Slides: AAROM;Right;10 reps Hip ABduction/ADduction: AAROM;Right;10 reps Long Arc Quad: AROM;Right;5 reps    General Comments        Pertinent Vitals/Pain Pain Assessment: No/denies pain Pain Intervention(s): Monitored during session;Premedicated before session;Ice applied;Repositioned    Home Living                      Prior Function            PT Goals (current goals can now be found in the care plan section) Acute Rehab PT Goals  Patient Stated Goal: to go home if at all possible PT Goal Formulation: With patient/family Time For Goal Achievement: 09/16/20 Potential to Achieve Goals: Good Progress towards PT goals: Progressing toward goals    Frequency    Min 4X/week      PT Plan Current plan remains appropriate    Co-evaluation              AM-PAC PT "6 Clicks" Mobility   Outcome Measure  Help needed turning from your back to your side while in a flat bed without using bedrails?: A Little Help needed moving from lying on your back to sitting on the side of a flat bed without using bedrails?: A Little Help needed moving to and from a bed to a chair (including a wheelchair)?: A Little Help needed standing up from a chair  using your arms (e.g., wheelchair or bedside chair)?: A Little Help needed to walk in hospital room?: A Little Help needed climbing 3-5 steps with a railing? : A Lot 6 Click Score: 17    End of Session Equipment Utilized During Treatment: Gait belt Activity Tolerance: Patient tolerated treatment well Patient left: in chair;with call bell/phone within reach;with chair alarm set;with family/visitor present Nurse Communication: Mobility status PT Visit Diagnosis: Difficulty in walking, not elsewhere classified (R26.2)     Time: 1448-1856 PT Time Calculation (min) (ACUTE ONLY): 18 min  Charges:  $Gait Training: 8-22 mins                     Baxter Flattery, PT  Acute Rehab Dept ([place]) [phone] Pager [phone]  09/04/2020    [facility] 09/04/2020, 11:24 AM

## 2020-09-04 NOTE — TOC Initial Note (Signed)
Transition of Care [facility]) - Initial/Assessment Note    Patient Details  Name: Kristin [name] MRN: [id number] Date of Birth: 11-12-1940  Transition of Care [facility]) CM/SW Contact:    Trish Mage, LCSW Phone Number: 09/04/2020, 12:32 PM  Clinical Narrative:   Met with patient in response to PT recommendation of HH PT, as well as MD consult to follow for "discharge needs."  Patient's daughter was at bedside, and the three of [place] talked together about the plan. [name] lives at home with her husband in [place], daughter and her husband live next door.  [name] has been an oncology patient here, and, as such, already has DME cane, RW, 3 in 1 and shower chair.  Daughter requested we order her a rollator so she can sit and rest as needed when ambulating.  They confirmed that she plans to return home, and that they would like [facility] services set up with [facility], who has seen her before for same.  Orders seen and appreciated.  Contacted Mariann Laster with [facility] in [place] who asked that I FAX referral to [phone]. Contacted [employer] for delivery of rollator. TOC will continue to follow during the course of hospitalization.           Expected Discharge Plan: [place] Barriers to Discharge: No Barriers Identified   Patient Goals and CMS Choice     Choice offered to / list presented to : Patient, Adult Children  Expected Discharge Plan and Services Expected Discharge Plan: [place]   Discharge Planning Services: CM Consult Post Acute Care Choice: [place] arrangements for the past 2 months: Single Family Home                 DME Arranged: Walker rolling with seat DME Agency: [employer] Date DME Agency Contacted: 09/04/20 Time DME Agency Contacted: 1222 Representative spoke with at DME Agency: [place] Arranged: OT, PT, RN [place] Agency: [place] Date [place]: 09/04/20 Time [place]:  92 Representative spoke with at [place]: Mariann Laster  Prior Living Arrangements/Services Living arrangements for the past 2 months: [place] with:: Spouse Patient language and need for interpreter reviewed:: Yes Do you feel safe going back to the place where you live?: Yes      Need for Family Participation in Patient Care: Yes (Comment) Care giver support system in place?: Yes (comment) Current home services: DME Criminal Activity/Legal Involvement Pertinent to Current Situation/Hospitalization: No - Comment as needed  Activities of Daily Living Home Assistive Devices/Equipment: Eyeglasses, Cane (specify quad or straight), Walker (specify type), Shower chair with back ADL Screening (condition at time of admission) Patient's cognitive ability adequate to safely complete daily activities?: Yes Is the patient deaf or have difficulty hearing?: Yes (slight) Does the patient have difficulty seeing, even when wearing glasses/contacts?: No Does the patient have difficulty concentrating, remembering, or making decisions?: Yes (sometimes problems with concentrations) Patient able to express need for assistance with ADLs?: Yes Does the patient have difficulty dressing or bathing?: Yes Independently performs ADLs?: No Communication: Independent Dressing (OT): Needs assistance Is this a change from baseline?: Pre-admission baseline Grooming: Independent Feeding: Independent Bathing: Needs assistance Is this a change from baseline?: Pre-admission baseline Toileting: Needs assistance Is this a change from baseline?: Pre-admission baseline In/Out Bed: Needs assistance Is this a change from baseline?: Pre-admission baseline Walks in Home: Needs assistance Is this a change from baseline?: Pre-admission baseline Does the  patient have difficulty walking or climbing stairs?: Yes Weakness of Legs: Both Weakness of Arms/Hands: Both  Permission Sought/Granted Permission sought to  share information with : Family Supports Permission granted to share information with : Yes, Verbal Permission Granted  Share Information with NAME: Wendall Mola (Daughter) [phone]           Emotional Assessment Appearance:: Appears stated age Attitude/Demeanor/Rapport: Engaged Affect (typically observed): Appropriate Orientation: : Oriented to Self, Oriented to Place, Oriented to Situation Alcohol / Substance Use: Not Applicable Psych Involvement: No (comment)  Admission diagnosis:  Fracture of femoral neck, right, closed ([place]) [S72.001A] Fall in home, initial encounter [W19.XXXA, Y92.009] Closed displaced fracture of right femoral neck ([place]) [S72.001A] Patient Active Problem List   Diagnosis Date Noted  . Closed displaced fracture of right femoral neck ([place]) 08/30/2020  . Fall at home, initial encounter 08/30/2020  . Fracture of femoral neck, right, closed ([place]) 08/30/2020  . 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 Thyroid nodule, hot 03/06/2020  . Goals of care, counseling/discussion 03/06/2020  . Persistent atrial fibrillation ([place])   . Acute respiratory failure ([place])   . Paroxysmal atrial fibrillation (HCC)   . Pulmonary embolism ([place]) 02/10/2020  . Acute pulmonary embolism ([place]) 02/09/2020  . Pleural effusion 02/09/2020  . 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 Malnutrition of moderate degree 06/15/2018  . CKD (chronic kidney disease), stage III ([place]) 06/11/2018  . Large bowel obstruction ([place]) 06/11/2018  . Acute lower UTI 06/11/2018  . Encounter for antineoplastic chemotherapy 10/28/2016  . Anemia of chronic disease 09/21/2016  . 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 S/P hip replacement, left 08/07/2016  . 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 Genetic testing 06/20/2016  . Family history of breast cancer   . Family history of colon cancer   . Family history of  brain cancer   . Family history of prostate cancer   . Melanocytic nevi of trunk 03/07/2016  . Seborrheic keratoses of trunk 03/06/2016  . Status post partial colectomy 03/06/2016  . 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 Osteoporosis 04/25/2015  . Disc disorder 05/30/2014  . Arthritis of left hip 05/30/2014  . Occipital cerebral infarction ([place]) 04/30/2013  . Hypertension 04/29/2013  . Nonspecific abnormal finding in stool contents 07/31/2011  . Esophageal reflux 07/31/2011  . Benign neoplasm of colon 07/31/2011  . Hernia, hiatal 07/31/2011  . Cancer of upper lobe of left lung () 06/13/2011  . Personal history of breast cancer 06/13/2011  . Diverticulosis of colon (without mention of hemorrhage) 06/13/2011  . Adenocarcinoma of lung ([place]) 01/01/2011  . Status of breast implant 05/20/2010  . Menopausal syndrome 07/03/2004  . Hypercholesterolemia 01/05/2002  . Absence of bladder continence 12/02/2000  . Depression 12/02/2000   PCP:  Eber Hong, MD Pharmacy:   [id number] [place] [id number]: [phone]: [phone]    Social Determinants of Health (SDOH) Interventions    Readmission Risk Interventions No flowsheet data found.

## 2020-09-04 NOTE — Anesthesia Postprocedure Evaluation (Signed)
Anesthesia Post Note  Patient: Kristin [name]  Procedure(s) Performed: TOTAL HIP ARTHROPLASTY ANTERIOR APPROACH (Right Hip)     Patient location during evaluation: PACU Anesthesia Type: General Level of consciousness: awake and alert Pain management: pain level controlled Vital Signs Assessment: post-procedure vital signs reviewed and stable Respiratory status: spontaneous breathing, nonlabored ventilation, respiratory function stable and patient connected to nasal cannula oxygen Cardiovascular status: blood pressure returned to baseline and stable Postop Assessment: no apparent nausea or vomiting Anesthetic complications: no   No complications documented.  Last Vitals:  Vitals:   09/04/20 0650 09/04/20 1403  BP: (!) 173/71 (!) 122/52  Pulse: 65 70  Resp: 20 14  Temp: 36.9 C (!) 36.4 C  SpO2: 95% 96%    Last Pain:  Vitals:   09/04/20 1403  TempSrc: Oral  PainSc:                  [name]

## 2020-09-04 NOTE — Progress Notes (Signed)
PROGRESS NOTE  Kristin [name] ZDG:[id number] DOB: Nov 25, 1940 DOA: 08/30/2020 PCP: Eber Hong, MD   LOS: 5 days   Brief Narrative / Interim history: 79 year old female with lung adenocarcinoma, history of breast and colon cancer, followed by oncology, paroxysmal A. fib on Xarelto, HTN, anemia of chronic disease, chronic kidney disease stage III, PE and DVT comes into the hospital after having a fall at home.  She stood up, had some dizziness. Imaging in the ER shows closed displaced fracture of the right femoral neck.  Orthopedic surgery consulted and patient was admitted to the hospitalist service.  Subjective / 24h Interval events: Feels well, no more nausea.  No chest pain, no abdominal pain, no vomiting.  Assessment & Plan: Principal Problem Closed displaced fracture of the right femoral neck -Orthopedic surgery was consulted, patient was taken to the operating room on 11/12 and she is status post right total hip arthroplasty by Dr. Lyla Glassing -PT evaluated patient, recommended home health PT -She is back on her Xarelto for DVT prophylaxis  Active Problems Essential hypertension -Continue home medications, blood pressure fairly well controlled this morning  Paroxysmal A. Fib -Continue amiodarone, now back on Xarelto  History of DVT/PE  -Continue Xarelto  Adenocarcinoma of the lung, personal history of breast cancer -Outpatient management  Chronic kidney disease stage IIIb -Baseline creatinine 1.2, remains at baseline today  Chronic anemia, acute blood loss anemia -Hemoglobin dipped to 7.6 on 11/14, received a unit of packed red blood cells, improved appropriately at 9.8 this morning.  No evidence of bleeding.  History of adenocarcinoma of the ascending colon in 2017-status post right colectomy with lymph node dissection.  Outpatient follow-up  Scheduled Meds: . amiodarone  200 mg Oral Daily  . brimonidine  1 drop Right Eye BID   And  . timolol  1 drop Right Eye BID  .  Chlorhexidine Gluconate Cloth  6 each Topical Daily  . clopidogrel  75 mg Oral Daily  . cycloSPORINE  1 drop Both Eyes BID  . docusate sodium  100 mg Oral BID  . escitalopram  10 mg Oral Daily  . famotidine  40 mg Oral BID  . feeding supplement  237 mL Oral BID BM  . feeding supplement (KATE FARMS STANDARD 1.4)  325 mL Oral Daily  . furosemide  10 mg Oral Daily  . influenza vaccine adjuvanted  0.5 mL Intramuscular Tomorrow-1000  . letrozole  2.5 mg Oral Daily  . nutrition supplement (JUVEN)  1 packet Oral BID BM  . osimertinib mesylate  80 mg Oral Daily  . rivaroxaban  15 mg Oral Daily   Continuous Infusions:  PRN Meds:.HYDROmorphone (DILAUDID) injection, menthol-cetylpyridinium **OR** phenol, metoCLOPramide **OR** metoCLOPramide (REGLAN) injection, ondansetron **OR** ondansetron (ZOFRAN) IV, senna-docusate, traMADol  Diet Orders (From admission, onward)    Start     Ordered   09/01/20 1650  Diet regular Room service appropriate? Yes; Fluid consistency: Thin  Diet effective now       Question Answer Comment  Room service appropriate? Yes   Fluid consistency: Thin      09/01/20 1650          DVT prophylaxis: SCDs Start: 09/01/20 1650 SCDs Start: 08/30/20 2127     Code Status: Full Code  Family Communication: Husband present at bedside  Status is: Inpatient  Remains inpatient appropriate because:Ongoing diagnostic testing needed not appropriate for outpatient work up and Inpatient level of care appropriate due to severity of illness   Dispo: The patient is from: Home  Anticipated d/c is to: SNF              Anticipated d/c date is: 3 days              Patient currently is not medically stable to d/c.  Consultants:  Orthopedic surgery   Procedures:  None   Microbiology  None   Antimicrobials: None     Objective: Vitals:   09/03/20 1245 09/03/20 1419 09/03/20 2104 09/04/20 0650  BP: (!) 113/48 (!) 128/56 (!) 156/63 (!) 173/71  Pulse: 79 78 75  65  Resp: 18 16 17 20   Temp: 99.5 F (37.5 C) (!) 97.5 F (36.4 C) 99.1 F (37.3 C) 98.4 F (36.9 C)  TempSrc: Oral Oral Oral Oral  SpO2: 96% 98% 96% 95%  Weight:      Height:        Intake/Output Summary (Last 24 hours) at 09/04/2020 0840 Last data filed at 09/04/2020 0500 Gross per 24 hour  Intake 240 ml  Output 200 ml  Net 40 ml   Filed Weights   09/01/20 1207  Weight: 54 kg    Examination:  Constitutional: She is in no distress, laying in bed Eyes: No scleral icterus noted ENMT: Moist external drains Neck: normal, supple Respiratory: Lungs are clear to auscultation bilaterally, no wheezing or crackles heard Cardiovascular: Heart is regular, no murmurs, no peripheral edema Abdomen: Nondistended, bowel sounds positive, ostomy in place Musculoskeletal: no clubbing / cyanosis.  Skin: No rash appreciated Neurologic: No focal deficits, equal strength   Data Reviewed: I have independently reviewed following labs and imaging studies   CBC: Recent Labs  Lab 08/31/20 0625 09/01/20 0612 09/02/20 0423 09/03/20 0338 09/04/20 0351  WBC 8.7 9.8 9.9 6.5 7.2  HGB 9.1* 10.0* 9.0* 7.6* 9.8*  HCT 28.8* 31.2* 28.6* 24.5* 30.2*  MCV 98.3 98.1 98.6 99.2 92.9  PLT 213 231 200 209 469   Basic Metabolic Panel: Recent Labs  Lab 08/30/20 1700 09/01/20 0612 09/02/20 0423 09/03/20 0338 09/04/20 0351  NA 136 137 135 137 135  K 4.2 4.4 4.8 4.4 4.2  CL 102 101 98 100 99  CO2 25 26 31 30 29   GLUCOSE 117* 118* 132* 107* 109*  BUN 21 25* 25* 35* 28*  CREATININE 1.19* 1.06* 0.96 0.90 0.89  CALCIUM 9.8 9.8 9.5 9.3 9.2   Liver Function Tests: Recent Labs  Lab 09/01/20 0612 09/04/20 0351  AST 14* 25  ALT 15 18  ALKPHOS 78 92  BILITOT 0.7 0.6  PROT 5.9* 5.4*  ALBUMIN 3.1* 2.6*   Coagulation Profile: No results for input(s): INR, PROTIME in the last 168 hours. HbA1C: No results for input(s): HGBA1C in the last 72 hours. CBG: Recent Labs  Lab 08/30/20 1617  GLUCAP  108*    Recent Results (from the past 240 hour(s))  Respiratory Panel by RT PCR (Flu A&B, Covid) - Nasopharyngeal Swab     Status: None   Collection Time: 08/30/20  6:46 PM   Specimen: Nasopharyngeal Swab  Result Value Ref Range Status   SARS Coronavirus 2 by RT PCR NEGATIVE NEGATIVE Final    Comment: (NOTE) SARS-CoV-2 target nucleic acids are NOT DETECTED.  The SARS-CoV-2 RNA is generally detectable in upper respiratoy specimens during the acute phase of infection. The lowest concentration of SARS-CoV-2 viral copies this assay can detect is 131 copies/mL. A negative result does not preclude SARS-Cov-2 infection and should not be used as the sole basis for treatment or other patient management  decisions. A negative result may occur with  improper specimen collection/handling, submission of specimen other than nasopharyngeal swab, presence of viral mutation(s) within the areas targeted by this assay, and inadequate number of viral copies (<131 copies/mL). A negative result must be combined with clinical observations, patient history, and epidemiological information. The expected result is Negative.  Fact Sheet for Patients:  PinkCheek.be  Fact Sheet for Healthcare Providers:  GravelBags.it  This test is no t yet approved or cleared by the [place] FDA and  has been authorized for detection and/or diagnosis of SARS-CoV-2 by FDA under an Emergency Use Authorization (EUA). This EUA will remain  in effect (meaning this test can be used) for the duration of the COVID-19 declaration under Section 564(b)(1) of the Act, 21 U.S.C. section 360bbb-3(b)(1), unless the authorization is terminated or revoked sooner.     Influenza A by PCR NEGATIVE NEGATIVE Final   Influenza B by PCR NEGATIVE NEGATIVE Final    Comment: (NOTE) The Xpert Xpress SARS-CoV-2/FLU/RSV assay is intended as an aid in  the diagnosis of influenza from  Nasopharyngeal swab specimens and  should not be used as a sole basis for treatment. Nasal washings and  aspirates are unacceptable for Xpert Xpress SARS-CoV-2/FLU/RSV  testing.  Fact Sheet for Patients: PinkCheek.be  Fact Sheet for Healthcare Providers: GravelBags.it  This test is not yet approved or cleared by the [place] FDA and  has been authorized for detection and/or diagnosis of SARS-CoV-2 by  FDA under an Emergency Use Authorization (EUA). This EUA will remain  in effect (meaning this test can be used) for the duration of the  Covid-19 declaration under Section 564(b)(1) of the Act, 21  U.S.C. section 360bbb-3(b)(1), unless the authorization is  terminated or revoked. Performed at [facility], [place] [address] [place], [place] [address]      Radiology Studies: No results found.  Marzetta Board, MD, PhD [facility]  Between 7 am - 7 pm I am available, please contact me via Amion or Securechat  Between 7 pm - 7 am I am not available, please contact night coverage MD/APP via Amion

## 2020-09-04 NOTE — Progress Notes (Signed)
Subjective: 3 Days Post-Op Procedure(s) (LRB): TOTAL HIP ARTHROPLASTY ANTERIOR APPROACH (Right) Patient reports pain as mild. Patient was experiencing more pain earlier this morning but pain medication has reduced the severity  No complaints of dizziness or lightheadedness with standing yesterday  Objective: Vital signs in last 24 hours: Temp:  [97.5 F (36.4 C)-99.5 F (37.5 C)] 98.4 F (36.9 C) (11/15 0650) Pulse Rate:  [65-79] 65 (11/15 0650) Resp:  [16-20] 20 (11/15 0650) BP: (113-173)/(48-71) 173/71 (11/15 0650) SpO2:  [95 %-100 %] 95 % (11/15 0650)  Intake/Output from previous day:  Intake/Output Summary (Last 24 hours) at 09/04/2020 0811 Last data filed at 09/04/2020 0500 Gross per 24 hour  Intake 240 ml  Output 200 ml  Net 40 ml    Intake/Output this shift: No intake/output data recorded.  Labs: Recent Labs    09/02/20 0423 09/03/20 0338 09/04/20 0351  HGB 9.0* 7.6* 9.8*   Recent Labs    09/03/20 0338 09/04/20 0351  WBC 6.5 7.2  RBC 2.47* 3.25*  HCT 24.5* 30.2*  PLT 209 225   Recent Labs    09/03/20 0338 09/04/20 0351  NA 137 135  K 4.4 4.2  CL 100 99  CO2 30 29  BUN 35* 28*  CREATININE 0.90 0.89  GLUCOSE 107* 109*  CALCIUM 9.3 9.2   No results for input(s): LABPT, INR in the last 72 hours.  EXAM General - Patient is Alert, Appropriate and Oriented Extremity - NO edema; neurovascular intact Dressing/Incision - Minimal bloody drainage at superior aspect of bandage Motor Function - intact, moving foot and toes well on exam.   Past Medical History:  Diagnosis Date  . Allergy   . Anemia   . Anemia of chronic disease 09/21/2016  . Anxiety   . Arthritis    arthritis- left hip  . Blood transfusion without reported diagnosis    transfusion- 3-4 yrs ago -found to be anemic on routine lab check  . Breast cancer ([place]) 2002   bilateral- bilateral mastectomies done.  . Chronic kidney disease   . 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 CVA  (cerebral infarction) 04/30/2013  . Dysrhythmia   . Encounter for antineoplastic chemotherapy 10/28/2016  . Family history of brain cancer   . Family history of breast cancer   . Family history of colon cancer 10/06/2009  . Family history of colon cancer   . Family history of prostate cancer   . GERD (gastroesophageal reflux disease)   . Glaucoma   . History of bilateral mastectomy 05/20/2010  . Hypertension   . Hypothyroidism   . Lung cancer ([place]) dx'd 12/2010   last Ct scan "no lung cancer" showing 11'16 CT Chest Epic.  . Malignant neoplasm of ascending colon ([place]) 01/29/2016  . Stroke [facility])    2 yrs ago-no residual  . Tubular adenoma of colon 07/2011   colon polyps ans reoccurence with malignancy found    Assessment/Plan: 3 Days Post-Op Procedure(s) (LRB): TOTAL HIP ARTHROPLASTY ANTERIOR APPROACH (Right) Principal Problem:   Closed displaced fracture of right femoral neck (HCC) Active Problems:   Personal history of breast cancer   Hypertension   Adenocarcinoma of lung (HCC)   CKD (chronic kidney disease), stage III (HCC)   Paroxysmal atrial fibrillation ([place])   Fall at home, initial encounter   Fracture of femoral neck, right, closed ([place])   Up with therapy  Hemoglobin is 7.6 but BP and pulse are normal. Will consider transfusion if she develops symptoms DVT Prophylaxis - Xarelto  and Plavix WBAT with walker  Dorothyann Peng 09/04/2020, 8:11 AM

## 2020-09-05 ENCOUNTER — Inpatient Hospital Stay: Payer: Medicare Other | Admitting: Internal Medicine

## 2020-09-05 DIAGNOSIS — I10 Essential (primary) hypertension: Secondary | ICD-10-CM | POA: Diagnosis not present

## 2020-09-05 DIAGNOSIS — S72001A Fracture of unspecified part of neck of right femur, initial encounter for closed fracture: Secondary | ICD-10-CM | POA: Diagnosis not present

## 2020-09-05 DIAGNOSIS — Z853 Personal history of malignant neoplasm of breast: Secondary | ICD-10-CM

## 2020-09-05 DIAGNOSIS — N1832 Chronic kidney disease, stage 3b: Secondary | ICD-10-CM | POA: Diagnosis not present

## 2020-09-05 DIAGNOSIS — I480 Paroxysmal atrial fibrillation: Principal | ICD-10-CM

## 2020-09-05 LAB — BASIC METABOLIC PANEL
Anion gap: 9 (ref 5–15)
BUN: 34 mg/dL — ABNORMAL HIGH (ref 8–23)
CO2: 26 mmol/L (ref 22–32)
Calcium: 9.2 mg/dL (ref 8.9–10.3)
Chloride: 99 mmol/L (ref 98–111)
Creatinine, Ser: 0.8 mg/dL (ref 0.44–1.00)
GFR, Estimated: >60 mL/min (ref >60)
Glucose, Bld: 122 mg/dL — ABNORMAL HIGH (ref 70–99)
Potassium: 4.1 mmol/L (ref 3.5–5.1)
Sodium: 134 mmol/L — ABNORMAL LOW (ref 135–145)

## 2020-09-05 LAB — CBC
HCT: 31.5 % — ABNORMAL LOW (ref 36.0–46.0)
Hemoglobin: 10.2 g/dL — ABNORMAL LOW (ref 12.0–15.0)
MCH: 29.8 pg (ref 26.0–34.0)
MCHC: 32.4 g/dL (ref 30.0–36.0)
MCV: 92.1 fL (ref 80.0–100.0)
Platelets: 272 10*3/uL (ref 150–400)
RBC: 3.42 MIL/uL — ABNORMAL LOW (ref 3.87–5.11)
RDW: 14.7 % (ref 11.5–15.5)
WBC: 7.6 10*3/uL (ref 4.0–10.5)
nRBC: 0 % (ref 0.0–0.2)

## 2020-09-05 MED ORDER — LABETALOL HCL 5 MG/ML IV SOLN
10.0000 mg | Freq: Once | INTRAVENOUS | Status: DC
Start: 2020-09-05 — End: 2020-09-05

## 2020-09-05 MED ORDER — LABETALOL HCL 5 MG/ML IV SOLN
10.0000 mg | Freq: Once | INTRAVENOUS | Status: AC
  Administered 2020-09-05: 10 mg via INTRAVENOUS
  Filled 2020-09-05: qty 4

## 2020-09-05 MED ORDER — AMLODIPINE BESYLATE 10 MG PO TABS
10.0000 mg | ORAL_TABLET | Freq: Every day | ORAL | Status: DC
  Administered 2020-09-05: 10 mg via ORAL
  Filled 2020-09-05: qty 1

## 2020-09-05 MED ORDER — AMLODIPINE BESYLATE 10 MG PO TABS: 10.0000 mg | ORAL_TABLET | Freq: Every day | ORAL | 0 refills | Status: AC

## 2020-09-05 MED ORDER — HYDRALAZINE HCL 20 MG/ML IJ SOLN
10.0000 mg | Freq: Once | INTRAMUSCULAR | Status: AC
  Administered 2020-09-05: 10 mg via INTRAVENOUS
  Filled 2020-09-05: qty 1

## 2020-09-05 NOTE — Progress Notes (Signed)
Pt's BP 185/77. No PRN BP medications on MAR. Attending notified, new order placed for labetalol. Administered. Will continue to monitor

## 2020-09-05 NOTE — Discharge Summary (Signed)
Physician Discharge Summary  Kristin [name] WOE:[id number] DOB: September 30, 1941 DOA: 08/30/2020  PCP: Eber Hong, MD  Admit date: 08/30/2020 Discharge date: 09/05/2020  Admitted From: home Disposition:  home  Recommendations for Outpatient Follow-up:  1. Follow up with PCP in 1-2 weeks 2. Follow-up with orthopedic surgery in 2 weeks  Home Health: PT, OT, RN Equipment/Devices: Walker  Discharge Condition: Stable CODE STATUS: Full code Diet recommendation: Regular diet  HPI: Per admitting MD, Kristin [name] is a 79 y.o. female with medical history significant for adenocarcinoma of lungs, history of breast cancer and colon cancer -followed by oncology, paroxysmal atrial fibrillation, hypertension, CKD stage III, anemia of chronic disease, PE and DVT in spring 2021.  Kristin [name] was at home sitting on her deck when she got up to go inside.  She reports having some dizziness after she stood up to walk back inside.  She reports is not uncommon that she has dizziness occasionally.  Tonight she was walking inside dizziness was profound and she felt weak and fell to the ground.  Husband was by her and kept her from hitting her head.  She denies any loss of consciousness.  She did land on her right side and reported pain in her right hip and was able to stand up after the fall.  EMS was called she was brought to the emergency room where she was found to have a right femoral neck fracture.  She denies having any chest pain, palpitations, slurred speech, drooping face, change in speech prior to event.  She does not have any numbness of her right leg.  She states she did not trip over anything that she knows of but simply lost her balance after becoming dizzy. She states he has been taking her regular medications which are listed in chart.  She did not have her Xarelto tonight prior to coming to the hospital.  Hospital Course / Discharge diagnoses: Principal Problem Closed displaced fracture of the  right femoral neck -Orthopedic surgery was consulted, patient was taken to the operating room on 11/12 and she is status post right total hip arthroplasty by Dr. Lyla Glassing.  Patient recovered well postop, physical therapy evaluated patient recommending home health.  This was arranged prior to discharge.  She is back on her Xarelto for DVT prophylaxis.  Active Problems Essential hypertension -patient is on furosemide alone at home, she tells me she has been quite hypertensive for a long time so this appears to be chronic.  I have added amlodipine, continue on discharge Paroxysmal A. Fib -Continue home regimen on discharge History of DVT/PE -Continue Xarelto Adenocarcinoma of the lung, personal history of breast cancer -Outpatient management, seeing Dr. Earlie Server Chronic kidney disease stage IIIb -Baseline creatinine 1.2, remains at baseline today Chronic anemia, acute blood loss anemia -Hemoglobin dipped to 7.6 on 11/14, received a unit of packed red blood cells, improved appropriately and is 10.2 at the time of discharge.  No bleeding. History of adenocarcinoma of the ascending colon in 2017-status post right colectomy with lymph node dissection.  Outpatient follow-up  Discharge Instructions   Allergies as of 09/05/2020      Reactions   Oxycodone Other (See Comments)   Not tolerate well   Doxycycline Nausea Only   Weak, stomach   Sulfa Antibiotics Rash      Medication List    TAKE these medications   acetaminophen 500 MG tablet Commonly known as: TYLENOL Take 1,000 mg by mouth in the morning and at bedtime.   amiodarone  200 MG tablet Commonly known as: PACERONE Take 1 tablet (200 mg total) by mouth daily.   amLODipine 10 MG tablet Commonly known as: NORVASC Take 1 tablet (10 mg total) by mouth daily. Start taking on: September 06, 2020   brimonidine-timolol 0.2-0.5 % ophthalmic solution Commonly known as: COMBIGAN Place 1 drop into the right eye every 12 (twelve) hours.    clopidogrel 75 MG tablet Commonly known as: PLAVIX Take 75 mg by mouth daily.   cycloSPORINE 0.05 % ophthalmic emulsion Commonly known as: RESTASIS Place 1 drop into both eyes 2 (two) times daily.   escitalopram 10 MG tablet Commonly known as: LEXAPRO Take 10 mg by mouth daily.   famotidine 20 MG tablet Commonly known as: PEPCID Take 40 mg by mouth 2 (two) times daily.   feeding supplement Liqd Take 237 mLs by mouth 2 (two) times daily between meals.   Folivane-Plus Caps TAKE 1 CAPSULE BY MOUTH EVERY DAY IN THE MORNING What changed: See the new instructions.   furosemide 20 MG tablet Commonly known as: LASIX Take 10 mg by mouth daily.   letrozole 2.5 MG tablet Commonly known as: FEMARA TAKE 1 TABLET BY MOUTH EVERY DAY   multivitamins with iron Tabs tablet Take 1 tablet by mouth daily.   osimertinib mesylate 80 MG tablet Commonly known as: TAGRISSO Take 1 tablet (80 mg total) by mouth daily.   PreserVision AREDS 2 Caps Take 1 capsule by mouth in the morning and at bedtime.   prochlorperazine 10 MG tablet Commonly known as: COMPAZINE Take 1 tablet (10 mg total) by mouth every 6 (six) hours as needed for nausea or vomiting.   rivaroxaban 20 MG Tabs tablet Commonly known as: XARELTO Take 20 mg by mouth daily with supper.   traMADol 50 MG tablet Commonly known as: ULTRAM Take 1 tablet (50 mg total) by mouth every 4 (four) hours as needed for moderate pain. What changed:   when to take this  reasons to take this            Durable Medical Equipment  (From admission, onward)         Start     Ordered   09/04/20 1215  For home use only DME 4 wheeled rolling walker with seat  Once       Question:  Patient needs a walker to treat with the following condition  Answer:  Hip fracture ([place])   09/04/20 1215          Follow-up Information    Swinteck, Aaron Edelman, MD. Schedule an appointment as soon as possible for a visit in 2 weeks.   Specialty: Orthopedic  Surgery Why: For suture removal, For wound re-check Contact information: [address] [place] [address] [phone]        [facility] Follow up.   Why: They are arranging to see you on Wednesday after you have returned home.  Call them if you do not hear from them. Contact information: [phone]              Consultations:  Orthopedic surgery   Procedures/Studies:  Right total hip arthroplasty, anterior approach by Dr. Lyla Glassing on 11/12  DG Chest 1 View  Result Date: 08/30/2020 CLINICAL DATA:  Fall EXAM: CHEST  1 VIEW COMPARISON:  June 12, 2020 FINDINGS: The heart size and mediastinal contours are mildly enlarged. Aortic knob calcifications are seen. A hiatal hernia is noted. There is a small left pleural effusion. Surgical sutures are seen  within the left lung apex. There is a 2.5 cm ill-defined nodular opacity seen in the periphery of the right upper lung. No acute osseous abnormality. IMPRESSION: Small left pleural effusion Mild cardiomegaly. Ill-defined nodular opacity in the right upper lung which could be due to focal infectious etiology, however cannot exclude pulmonary nodule. If further evaluation is required would recommend CT chest. Electronically Signed   By: Prudencio Pair M.D.   On: 08/30/2020 18:32   Pelvis Portable  Result Date: 09/01/2020 CLINICAL DATA:  Right total hip arthroplasty EXAM: PORTABLE PELVIS 1-2 VIEWS COMPARISON:  08/30/2020 FINDINGS: Frontal view of the pelvis including both hips demonstrates bilateral hip arthroplasties in the expected position without signs of acute complication. Postsurgical changes are seen overlying the right hip. IMPRESSION: 1. Bilateral hip arthroplasties as above. Electronically Signed   By: Randa Ngo M.D.   On: 09/01/2020 17:15   MR XVQMG RIGHT WO CONTRAST  Result Date: 08/30/2020 CLINICAL DATA:  Femur fracture EXAM: MRI OF THE RIGHT FEMUR WITHOUT CONTRAST TECHNIQUE: Multiplanar,  multisequence MR imaging of the right femur was performed. No intravenous contrast was administered. COMPARISON:  None. FINDINGS: Bones/Joint/Cartilage There is a nondisplaced slightly impacted subcapital right femoral neck fracture. Surrounding marrow edema is noted. The femoral head is still well seated within the acetabulum. 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 There is diffuse labral degeneration seen throughout. Ligaments The ligamentum teres appears to be intact. Muscles and Tendons Increased signal seen within the adductor musculature. There is also mildly increased signal seen within the gluteus medius musculature. Increased signal seen at the insertion site of the gluteal tendons, however they are intact. The iliopsoas and hamstrings tendons intact. Soft tissues Mild soft tissue swelling seen around the lateral aspect of the hip. The visualized deep pelvis is grossly. IMPRESSION: There is a nondisplaced slightly impacted subcapital right femoral neck fracture with surrounding marrow edema. Adductor musculature edema. Electronically Signed   By: Prudencio Pair M.D.   On: 08/30/2020 22:28   DG C-Arm 1-60 Min-No Report  Result Date: 09/01/2020 Fluoroscopy was utilized by the requesting physician.  No radiographic interpretation.   DG HIP OPERATIVE UNILAT W OR W/O PELVIS RIGHT  Result Date: 09/01/2020 CLINICAL DATA:  Known right hip fracture EXAM: OPERATIVE RIGHT HIP WITH PELVIS COMPARISON:  08/30/2020 FLUOROSCOPY TIME:  Radiation Exposure Index (as provided by the fluoroscopic device): 0.95 mGy If the device does not provide the exposure index: Fluoroscopy Time:  6 seconds Number of Acquired Images:  4 FINDINGS: Initial images again demonstrate the subcapital femoral neck fracture with impaction. Right hip replacement is subsequently placed in satisfactory position. No acute bony or soft tissue abnormality is seen. IMPRESSION: Status post  right hip replacement Electronically Signed   By: Inez Catalina M.D.   On: 09/01/2020 16:05   DG Hip Unilat  With Pelvis 2-3 Views Right  Result Date: 08/30/2020 CLINICAL DATA:  79 year old female with fall and right hip pain. EXAM: DG HIP (WITH OR WITHOUT PELVIS) 2-3V RIGHT COMPARISON:  CT abdomen pelvis dated 06/12/2020. FINDINGS: There is a nondisplaced fracture of the right femoral neck with mild impaction. No dislocation. The bones are osteopenic. There is a total left hip arthroplasty. The arthroplasty components appear intact. Degenerative changes of the lower lumbar spine. The soft tissues are unremarkable. IMPRESSION: Nondisplaced fracture of the right femoral neck. Electronically Signed   By: Anner Crete M.D.   On: 08/30/2020 18:23     Subjective: - no chest  pain, shortness of breath, no abdominal pain, nausea or vomiting.   Discharge Exam: BP (!) 171/82 (BP Location: Right Arm)   Pulse 66   Temp 98.6 F (37 C)   Resp 18   Ht 5\' 8"  (1.727 m)   Wt 54 kg   SpO2 96%   BMI 18.09 kg/m   General: Pt is alert, awake, not in acute distress Cardiovascular: RRR, S1/S2 +, no rubs, no gallops Respiratory: CTA bilaterally, no wheezing, no rhonchi Abdominal: Soft, NT, ND, bowel sounds + Extremities: no edema, no cyanosis    The results of significant diagnostics from this hospitalization (including imaging, microbiology, ancillary and laboratory) are listed below for reference.     Microbiology: Recent Results (from the past 240 hour(s))  Respiratory Panel by RT PCR (Flu A&B, Covid) - Nasopharyngeal Swab     Status: None   Collection Time: 08/30/20  6:46 PM   Specimen: Nasopharyngeal Swab  Result Value Ref Range Status   SARS Coronavirus 2 by RT PCR NEGATIVE NEGATIVE Final    Comment: (NOTE) SARS-CoV-2 target nucleic acids are NOT DETECTED.  The SARS-CoV-2 RNA is generally detectable in upper respiratoy specimens during the acute phase of infection. The  lowest concentration of SARS-CoV-2 viral copies this assay can detect is 131 copies/mL. A negative result does not preclude SARS-Cov-2 infection and should not be used as the sole basis for treatment or other patient management decisions. A negative result may occur with  improper specimen collection/handling, submission of specimen other than nasopharyngeal swab, presence of viral mutation(s) within the areas targeted by this assay, and inadequate number of viral copies (<131 copies/mL). A negative result must be combined with clinical observations, patient history, and epidemiological information. The expected result is Negative.  Fact Sheet for Patients:  PinkCheek.be  Fact Sheet for Healthcare Providers:  GravelBags.it  This test is no t yet approved or cleared by the [place] FDA and  has been authorized for detection and/or diagnosis of SARS-CoV-2 by FDA under an Emergency Use Authorization (EUA). This EUA will remain  in effect (meaning this test can be used) for the duration of the COVID-19 declaration under Section 564(b)(1) of the Act, 21 U.S.C. section 360bbb-3(b)(1), unless the authorization is terminated or revoked sooner.     Influenza A by PCR NEGATIVE NEGATIVE Final   Influenza B by PCR NEGATIVE NEGATIVE Final    Comment: (NOTE) The Xpert Xpress SARS-CoV-2/FLU/RSV assay is intended as an aid in  the diagnosis of influenza from Nasopharyngeal swab specimens and  should not be used as a sole basis for treatment. Nasal washings and  aspirates are unacceptable for Xpert Xpress SARS-CoV-2/FLU/RSV  testing.  Fact Sheet for Patients: PinkCheek.be  Fact Sheet for Healthcare Providers: GravelBags.it  This test is not yet approved or cleared by the [place] FDA and  has been authorized for detection and/or diagnosis of SARS-CoV-2 by  FDA under  an Emergency Use Authorization (EUA). This EUA will remain  in effect (meaning this test can be used) for the duration of the  Covid-19 declaration under Section 564(b)(1) of the Act, 21  U.S.C. section 360bbb-3(b)(1), unless the authorization is  terminated or revoked. Performed at [facility], [place] [address], [place], [place] [address]      Labs: Basic Metabolic Panel: Recent Labs  Lab 09/01/20 0612 09/02/20 0423 09/03/20 0338 09/04/20 0351 09/05/20 0341  NA 137 135 137 135 134*  K 4.4 4.8 4.4 4.2 4.1  CL 101 98 100  99 99  CO2 26 31 30 29 26   GLUCOSE 118* 132* 107* 109* 122*  BUN 25* 25* 35* 28* 34*  CREATININE 1.06* 0.96 0.90 0.89 0.80  CALCIUM 9.8 9.5 9.3 9.2 9.2   Liver Function Tests: Recent Labs  Lab 09/01/20 0612 09/04/20 0351  AST 14* 25  ALT 15 18  ALKPHOS 78 92  BILITOT 0.7 0.6  PROT 5.9* 5.4*  ALBUMIN 3.1* 2.6*   CBC: Recent Labs  Lab 09/01/20 0612 09/02/20 0423 09/03/20 0338 09/04/20 0351 09/05/20 0341  WBC 9.8 9.9 6.5 7.2 7.6  HGB 10.0* 9.0* 7.6* 9.8* 10.2*  HCT 31.2* 28.6* 24.5* 30.2* 31.5*  MCV 98.1 98.6 99.2 92.9 92.1  PLT 231 200 209 225 272   CBG: Recent Labs  Lab 08/30/20 1617  GLUCAP 108*   Hgb A1c No results for input(s): HGBA1C in the last 72 hours. Lipid Profile No results for input(s): CHOL, HDL, LDLCALC, TRIG, CHOLHDL, LDLDIRECT in the last 72 hours. Thyroid function studies No results for input(s): TSH, T4TOTAL, T3FREE, THYROIDAB in the last 72 hours.  Invalid input(s): FREET3 Urinalysis    Component Value Date/Time   COLORURINE AMBER (A) 08/30/2020 1615   APPEARANCEUR HAZY (A) 08/30/2020 1615   LABSPEC 1.021 08/30/2020 1615   PHURINE 5.0 08/30/2020 1615   GLUCOSEU NEGATIVE 08/30/2020 1615   HGBUR NEGATIVE 08/30/2020 1615   BILIRUBINUR NEGATIVE 08/30/2020 1615   KETONESUR NEGATIVE 08/30/2020 1615   PROTEINUR NEGATIVE 08/30/2020 1615   UROBILINOGEN 0.2 04/29/2013 2243   NITRITE POSITIVE (A)  08/30/2020 1615   LEUKOCYTESUR MODERATE (A) 08/30/2020 1615    FURTHER DISCHARGE INSTRUCTIONS:   Get Medicines reviewed and adjusted: Please take all your medications with you for your next visit with your Primary MD   Laboratory/radiological data: Please request your Primary MD to go over all hospital tests and procedure/radiological results at the follow up, please ask your Primary MD to get all Hospital records sent to his/her office.   In some cases, they will be blood work, cultures and biopsy results pending at the time of your discharge. Please request that your primary care M.D. goes through all the records of your hospital data and follows up on these results.   Also Note the following: If you experience worsening of your admission symptoms, develop shortness of breath, life threatening emergency, suicidal or homicidal thoughts you must seek medical attention immediately by calling 911 or calling your MD immediately  if symptoms less severe.   You must read complete instructions/literature along with all the possible adverse reactions/side effects for all the Medicines you take and that have been prescribed to you. Take any new Medicines after you have completely understood and accpet all the possible adverse reactions/side effects.    Do not drive when taking Pain medications or sleeping medications (Benzodaizepines)   Do not take more than prescribed Pain, Sleep and Anxiety Medications. It is not advisable to combine anxiety,sleep and pain medications without talking with your primary care practitioner   Special Instructions: If you have smoked or chewed Tobacco  in the last 2 yrs please stop smoking, stop any regular Alcohol  and or any Recreational drug use.   Wear Seat belts while driving.   Please note: You were cared for by a hospitalist during your hospital stay. Once you are discharged, your primary care physician will handle any further medical issues. Please note that  NO REFILLS for any discharge medications will be authorized once you are discharged, as it is  imperative that you return to your primary care physician (or establish a relationship with a primary care physician if you do not have one) for your post hospital discharge needs so that they can reassess your need for medications and monitor your lab values.  Time coordinating discharge: 40 minutes  SIGNED:  Marzetta Board, MD, PhD 09/05/2020, 9:09 AM

## 2020-09-05 NOTE — Progress Notes (Signed)
Physical Therapy Treatment Patient Details Name: Kristin [name] MRN: [id number] DOB: 1941-03-13 Today's Date: 09/05/2020    History of Present Illness 79 year old female with lung adenocarcinoma, history of breast and colon cancer, followed by oncology, paroxysmal A. fib on Xarelto, HTN, anemia of chronic disease, chronic kidney disease stage III, PE and DVT comes into the hospital after having a fall at home.  She stood up, had some dizziness. Imaging in the ER shows closed displaced fracture of the right femoral neck.  Orthopedic surgery consulted and patient was admitted to the hospitalist service. Pt underwent DA THA for fixation of R femoral neck fx.    PT Comments     pt continues to progress toward PT goals. dtr assisting with gait, providing ~ min assist for balance and safety.  Pt has supportive family, has made excellent progress in acute setting and should continue to progress well with HHPT.   Follow Up Recommendations  Home health PT;Supervision/Assistance - 24 hour     Equipment Recommendations  None recommended by PT    Recommendations for Other Services       Precautions / Restrictions Precautions Precautions: Fall Restrictions Weight Bearing Restrictions: No RLE Weight Bearing: Weight bearing as tolerated Other Position/Activity Restrictions: WBAT    Mobility  Bed Mobility Overal bed mobility: Needs Assistance Bed Mobility: Supine to Sit     Supine to sit: Supervision;HOB elevated     General bed mobility comments: incr time, cues for technique  Transfers Overall transfer level: Needs assistance Equipment used: Rolling walker (2 wheeled) Transfers: Sit to/from Stand Sit to Stand: Min assist         General transfer comment: assist to rise and transiton to RW  Ambulation/Gait Ambulation/Gait assistance: Min Web designer (Feet): 25 Feet Assistive device: Rolling walker (2 wheeled) Gait Pattern/deviations: Step-to pattern;Decreased step  length - right;Decreased step length - left     General Gait Details: cues for sequence and RW position, min assist to min/guard  to steady although no overt LOB    Stairs             Wheelchair Mobility    Modified Rankin (Stroke Patients Only)       Balance             Standing balance-Leahy Scale: Poor Standing balance comment: reliant on UEs                             Cognition Arousal/Alertness: Awake/alert Behavior During Therapy: WFL for tasks assessed/performed;Flat affect Overall Cognitive Status: Within Functional Limits for tasks assessed                                        Exercises Total Joint Exercises Ankle Circles/Pumps: AROM;Left;10 reps Quad Sets: AROM;Both;10 reps    General Comments        Pertinent Vitals/Pain Pain Assessment: No/denies pain Pain Intervention(s): Monitored during session    Home Living                      Prior Function            PT Goals (current goals can now be found in the care plan section) Acute Rehab PT Goals Patient Stated Goal: to go home if at all possible PT Goal Formulation: With patient/family Time For Goal Achievement: 09/16/20 Potential  to Achieve Goals: Good Progress towards PT goals: Progressing toward goals    Frequency    Min 4X/week      PT Plan Current plan remains appropriate    Co-evaluation              AM-PAC PT "6 Clicks" Mobility   Outcome Measure  Help needed turning from your back to your side while in a flat bed without using bedrails?: A Little Help needed moving from lying on your back to sitting on the side of a flat bed without using bedrails?: A Little Help needed moving to and from a bed to a chair (including a wheelchair)?: A Little Help needed standing up from a chair using your arms (e.g., wheelchair or bedside chair)?: A Little Help needed to walk in hospital room?: A Little Help needed climbing 3-5 steps  with a railing? : A Lot 6 Click Score: 17    End of Session Equipment Utilized During Treatment: Gait belt Activity Tolerance: Patient tolerated treatment well Patient left: in chair;with call bell/phone within reach;with chair alarm set;with family/visitor present Nurse Communication: Mobility status PT Visit Diagnosis: Difficulty in walking, not elsewhere classified (R26.2)     Time: 1155-2080 PT Time Calculation (min) (ACUTE ONLY): 19 min  Charges:  $Gait Training: 8-22 mins                     Baxter Flattery, PT  Acute Rehab Dept ([place]) [phone] Pager [phone]  09/05/2020    [facility] 09/05/2020, 12:25 PM

## 2020-09-06 ENCOUNTER — Encounter (HOSPITAL_COMMUNITY): Payer: Self-pay | Admitting: Orthopedic Surgery

## 2020-09-06 ENCOUNTER — Other Ambulatory Visit: Payer: Self-pay | Admitting: Medical Oncology

## 2020-09-06 DIAGNOSIS — C50111 Malignant neoplasm of central portion of right female breast: Secondary | ICD-10-CM

## 2020-09-06 DIAGNOSIS — Z5111 Encounter for antineoplastic chemotherapy: Secondary | ICD-10-CM

## 2020-09-06 DIAGNOSIS — D638 Anemia in other chronic diseases classified elsewhere: Secondary | ICD-10-CM

## 2020-09-06 DIAGNOSIS — C182 Malignant neoplasm of ascending colon: Secondary | ICD-10-CM

## 2020-09-06 DIAGNOSIS — Z170 Estrogen receptor positive status [ER+]: Secondary | ICD-10-CM

## 2020-09-06 DIAGNOSIS — C3412 Malignant neoplasm of upper lobe, left bronchus or lung: Secondary | ICD-10-CM

## 2020-09-06 MED ORDER — OSIMERTINIB MESYLATE 80 MG PO TABS: 80.0000 mg | ORAL_TABLET | Freq: Every day | ORAL | 3 refills | Status: DC

## 2020-09-07 ENCOUNTER — Telehealth: Payer: Self-pay | Admitting: Medical Oncology

## 2020-09-07 NOTE — Telephone Encounter (Signed)
Tagrisso refill-Pt was told by [facility]" they need a new rx. I called [facility] and lvm to call me because tagresso was escribed to [facility] yesterday as they are  the pharmacy for [place] [facility]. I spoke to pt and dtr that I am working on the issue. Message sent to oral oncology pharmacy team.

## 2020-09-09 ENCOUNTER — Other Ambulatory Visit: Payer: Self-pay | Admitting: Internal Medicine

## 2020-09-13 ENCOUNTER — Telehealth: Payer: Self-pay | Admitting: Medical Oncology

## 2020-09-13 ENCOUNTER — Other Ambulatory Visit: Payer: Self-pay | Admitting: Medical Oncology

## 2020-09-13 DIAGNOSIS — C50112 Malignant neoplasm of central portion of left female breast: Secondary | ICD-10-CM

## 2020-09-13 DIAGNOSIS — C182 Malignant neoplasm of ascending colon: Secondary | ICD-10-CM

## 2020-09-13 DIAGNOSIS — Z170 Estrogen receptor positive status [ER+]: Secondary | ICD-10-CM

## 2020-09-13 DIAGNOSIS — C3412 Malignant neoplasm of upper lobe, left bronchus or lung: Secondary | ICD-10-CM

## 2020-09-13 DIAGNOSIS — Z5111 Encounter for antineoplastic chemotherapy: Secondary | ICD-10-CM

## 2020-09-13 DIAGNOSIS — D638 Anemia in other chronic diseases classified elsewhere: Secondary | ICD-10-CM

## 2020-09-13 MED ORDER — OSIMERTINIB MESYLATE 80 MG PO TABS: 80.0000 mg | ORAL_TABLET | Freq: Every day | ORAL | 3 refills | Status: AC

## 2020-09-13 NOTE — Telephone Encounter (Signed)
medication shipped yesterday.

## 2020-09-13 NOTE — Telephone Encounter (Signed)
Faxed tagrisso rx to [facility]

## 2020-09-20 ENCOUNTER — Telehealth: Payer: Self-pay | Admitting: Medical Oncology

## 2020-09-20 NOTE — Telephone Encounter (Signed)
Faxed med information per their request.

## 2020-09-26 ENCOUNTER — Telehealth: Payer: Self-pay

## 2020-09-26 NOTE — Telephone Encounter (Signed)
Received a letter from [facility] that patient has been re-enrolled for Tagrisso at no charge from [facility] 10/21/20-10/20/21  [facility] phone [phone]  [place] [facility] [place] Phone [phone] Fax [phone] 09/26/2020 3:29 PM

## 2020-10-03 ENCOUNTER — Ambulatory Visit: Payer: Medicare Other | Admitting: Cardiovascular Disease

## 2020-10-10 ENCOUNTER — Inpatient Hospital Stay: Payer: Medicare Other | Attending: Internal Medicine

## 2020-10-10 ENCOUNTER — Other Ambulatory Visit: Payer: Self-pay

## 2020-10-10 ENCOUNTER — Ambulatory Visit (HOSPITAL_COMMUNITY)
Admission: RE | Admit: 2020-10-10 | Discharge: 2020-10-10 | Disposition: A | Discharge status: Home / Self Care | Payer: Medicare Other | Source: Ambulatory Visit | Attending: Internal Medicine | Admitting: Internal Medicine

## 2020-10-10 DIAGNOSIS — C3412 Malignant neoplasm of upper lobe, left bronchus or lung: Secondary | ICD-10-CM | POA: Diagnosis present

## 2020-10-10 DIAGNOSIS — Z9013 Acquired absence of bilateral breasts and nipples: Secondary | ICD-10-CM | POA: Diagnosis not present

## 2020-10-10 DIAGNOSIS — D638 Anemia in other chronic diseases classified elsewhere: Secondary | ICD-10-CM | POA: Insufficient documentation

## 2020-10-10 DIAGNOSIS — Z803 Family history of malignant neoplasm of breast: Secondary | ICD-10-CM | POA: Diagnosis not present

## 2020-10-10 DIAGNOSIS — Z7901 Long term (current) use of anticoagulants: Secondary | ICD-10-CM | POA: Insufficient documentation

## 2020-10-10 DIAGNOSIS — Z8249 Family history of ischemic heart disease and other diseases of the circulatory system: Secondary | ICD-10-CM | POA: Diagnosis not present

## 2020-10-10 DIAGNOSIS — Z170 Estrogen receptor positive status [ER+]: Secondary | ICD-10-CM | POA: Insufficient documentation

## 2020-10-10 DIAGNOSIS — Z79811 Long term (current) use of aromatase inhibitors: Secondary | ICD-10-CM | POA: Diagnosis not present

## 2020-10-10 DIAGNOSIS — N189 Chronic kidney disease, unspecified: Secondary | ICD-10-CM | POA: Diagnosis not present

## 2020-10-10 DIAGNOSIS — C50919 Malignant neoplasm of unspecified site of unspecified female breast: Secondary | ICD-10-CM | POA: Insufficient documentation

## 2020-10-10 DIAGNOSIS — Z23 Encounter for immunization: Secondary | ICD-10-CM | POA: Diagnosis not present

## 2020-10-10 DIAGNOSIS — Z8379 Family history of other diseases of the digestive system: Secondary | ICD-10-CM | POA: Diagnosis not present

## 2020-10-10 DIAGNOSIS — Z8041 Family history of malignant neoplasm of ovary: Secondary | ICD-10-CM | POA: Insufficient documentation

## 2020-10-10 DIAGNOSIS — Z808 Family history of malignant neoplasm of other organs or systems: Secondary | ICD-10-CM | POA: Insufficient documentation

## 2020-10-10 DIAGNOSIS — Z800 Family history of malignant neoplasm of digestive organs: Secondary | ICD-10-CM | POA: Insufficient documentation

## 2020-10-10 DIAGNOSIS — Z79899 Other long term (current) drug therapy: Secondary | ICD-10-CM | POA: Diagnosis not present

## 2020-10-10 DIAGNOSIS — Z85038 Personal history of other malignant neoplasm of large intestine: Secondary | ICD-10-CM | POA: Insufficient documentation

## 2020-10-10 DIAGNOSIS — F419 Anxiety disorder, unspecified: Secondary | ICD-10-CM | POA: Insufficient documentation

## 2020-10-10 DIAGNOSIS — Z8349 Family history of other endocrine, nutritional and metabolic diseases: Secondary | ICD-10-CM | POA: Diagnosis not present

## 2020-10-10 DIAGNOSIS — C3490 Malignant neoplasm of unspecified part of unspecified bronchus or lung: Secondary | ICD-10-CM | POA: Diagnosis not present

## 2020-10-10 DIAGNOSIS — D539 Nutritional anemia, unspecified: Secondary | ICD-10-CM

## 2020-10-10 LAB — CBC WITH DIFFERENTIAL (CANCER CENTER ONLY)
Abs Immature Granulocytes: 0.01 10*3/uL (ref 0.00–0.07)
Basophils Absolute: 0.1 10*3/uL (ref 0.0–0.1)
Basophils Relative: 1 %
Eosinophils Absolute: 0.2 10*3/uL (ref 0.0–0.5)
Eosinophils Relative: 3 %
HCT: 35.5 % — ABNORMAL LOW (ref 36.0–46.0)
Hemoglobin: 11.5 g/dL — ABNORMAL LOW (ref 12.0–15.0)
Immature Granulocytes: 0 %
Lymphocytes Relative: 18 %
Lymphs Abs: 1 10*3/uL (ref 0.7–4.0)
MCH: 29.9 pg (ref 26.0–34.0)
MCHC: 32.4 g/dL (ref 30.0–36.0)
MCV: 92.2 fL (ref 80.0–100.0)
Monocytes Absolute: 0.8 10*3/uL (ref 0.1–1.0)
Monocytes Relative: 14 %
Neutro Abs: 3.7 10*3/uL (ref 1.7–7.7)
Neutrophils Relative %: 64 %
Platelet Count: 356 10*3/uL (ref 150–400)
RBC: 3.85 MIL/uL — ABNORMAL LOW (ref 3.87–5.11)
RDW: 15.8 % — ABNORMAL HIGH (ref 11.5–15.5)
WBC Count: 5.7 10*3/uL (ref 4.0–10.5)
nRBC: 0 % (ref 0.0–0.2)

## 2020-10-10 LAB — CMP (CANCER CENTER ONLY)
ALT: 13 U/L (ref 0–44)
AST: 17 U/L (ref 15–41)
Albumin: 3.4 g/dL — ABNORMAL LOW (ref 3.5–5.0)
Alkaline Phosphatase: 128 U/L — ABNORMAL HIGH (ref 38–126)
Anion gap: 7 (ref 5–15)
BUN: 22 mg/dL (ref 8–23)
CO2: 28 mmol/L (ref 22–32)
Calcium: 10.4 mg/dL — ABNORMAL HIGH (ref 8.9–10.3)
Chloride: 104 mmol/L (ref 98–111)
Creatinine: 1.13 mg/dL — ABNORMAL HIGH (ref 0.44–1.00)
GFR, Estimated: 49 mL/min — ABNORMAL LOW (ref >60)
Glucose, Bld: 97 mg/dL (ref 70–99)
Potassium: 3.9 mmol/L (ref 3.5–5.1)
Sodium: 139 mmol/L (ref 135–145)
Total Bilirubin: 0.3 mg/dL (ref 0.3–1.2)
Total Protein: 7 g/dL (ref 6.5–8.1)

## 2020-10-10 LAB — FERRITIN: Ferritin: 162 ng/mL (ref 11–307)

## 2020-10-10 LAB — VITAMIN B12: Vitamin B-12: 458 pg/mL (ref 180–914)

## 2020-10-10 LAB — IRON AND TIBC
Iron: 50 ug/dL (ref 41–142)
Saturation Ratios: 19 % — ABNORMAL LOW (ref 21–57)
TIBC: 260 ug/dL (ref 236–444)
UIBC: 211 ug/dL (ref 120–384)

## 2020-10-10 LAB — FOLATE: Folate: 38.5 ng/mL (ref >5.9)

## 2020-10-10 MED ORDER — IOHEXOL 300 MG/ML  SOLN
100.0000 mL | Freq: Once | INTRAMUSCULAR | Status: AC | PRN
  Administered 2020-10-10: 75 mL via INTRAVENOUS

## 2020-10-12 ENCOUNTER — Encounter: Payer: Self-pay | Admitting: Cardiology

## 2020-10-12 ENCOUNTER — Ambulatory Visit (INDEPENDENT_AMBULATORY_CARE_PROVIDER_SITE_OTHER): Payer: Medicare Other | Admitting: Cardiology

## 2020-10-12 VITALS — BP 142/70 | HR 75 | Ht 69.0 in | Wt 123.0 lb

## 2020-10-12 DIAGNOSIS — I4891 Unspecified atrial fibrillation: Secondary | ICD-10-CM

## 2020-10-12 DIAGNOSIS — I10 Essential (primary) hypertension: Secondary | ICD-10-CM

## 2020-10-12 DIAGNOSIS — I2699 Other pulmonary embolism without acute cor pulmonale: Secondary | ICD-10-CM

## 2020-10-12 MED ORDER — LISINOPRIL 10 MG PO TABS: 10.0000 mg | ORAL_TABLET | Freq: Every day | ORAL | 1 refills | Status: AC

## 2020-10-12 NOTE — Patient Instructions (Signed)
Your physician recommends that you schedule a follow-up appointment in: [facility] has recommended you make the following change in your medication:   STOP AMIODARONE   START LISINOPRIL 10 MG DAILY   Your physician recommends that you return for lab work 2 WEEKS BMP  Thank you for choosing [facility]!!

## 2020-10-12 NOTE — Progress Notes (Signed)
Clinical Summary Ms. Strathman is a 79 y.o.female previously followed by Dr Bronson Ing, this is our first visit together.   1. PAF - diagnosed in 01/2020 in setting of PE, UTI, left pleural effusion. Started on amio during that admission due to low bp's and afib with RVR - no recent palpitaitons - compliant with meds - no bleeding on xarelto.   2. 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  - first episode of blood clots. From history appears unprovoked.   3. HTN - compliant with meds - home bp's 140s/70s   4. History of CVA - on plavix   5. Hyperthyroid  6. Lung cancer    Has had covid vaccine x 2, moderna    Past Medical History:  Diagnosis Date  . Allergy   . Anemia   . Anemia of chronic disease 09/21/2016  . Anxiety   . Arthritis    arthritis- left hip  . Blood transfusion without reported diagnosis    transfusion- 3-4 yrs ago -found to be anemic on routine lab check  . Breast cancer ([place]) 2002   bilateral- bilateral mastectomies done.  . Chronic kidney disease   . 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 CVA (cerebral infarction) 04/30/2013  . Dysrhythmia   . Encounter for antineoplastic chemotherapy 10/28/2016  . Family history of brain cancer   . Family history of breast cancer   . Family history of colon cancer 10/06/2009  . Family history of colon cancer   . Family history of prostate cancer   . GERD (gastroesophageal reflux disease)   . Glaucoma   . History of bilateral mastectomy 05/20/2010  . Hypertension   . Hypothyroidism   . Lung cancer ([place]) dx'd 12/2010   last Ct scan "no lung cancer" showing 11'16 CT Chest Epic.  . Malignant neoplasm of ascending colon ([place]) 01/29/2016  . Stroke [facility])    2 yrs ago-no residual  . Tubular adenoma of colon 07/2011   colon polyps ans reoccurence with malignancy found     Allergies  Allergen Reactions  . Oxycodone Other (See Comments)    Not tolerate well  . Doxycycline Nausea Only    Weak, stomach  . Sulfa  Antibiotics Rash     Current Outpatient Medications  Medication Sig Dispense Refill  . acetaminophen (TYLENOL) 500 MG tablet Take 1,000 mg by mouth in the morning and at bedtime.    Marland Kitchen amiodarone (PACERONE) 200 MG tablet Take 1 tablet (200 mg total) by mouth daily. 90 tablet 2  . amLODipine (NORVASC) 10 MG tablet Take 1 tablet (10 mg total) by mouth daily. 30 tablet 0  . brimonidine-timolol (COMBIGAN) 0.2-0.5 % ophthalmic solution Place 1 drop into the right eye every 12 (twelve) hours.    . clopidogrel (PLAVIX) 75 MG tablet Take 75 mg by mouth daily.    . cycloSPORINE (RESTASIS) 0.05 % ophthalmic emulsion Place 1 drop into both eyes 2 (two) times daily.    Marland Kitchen escitalopram (LEXAPRO) 10 MG tablet Take 10 mg by mouth daily.     . famotidine (PEPCID) 20 MG tablet Take 40 mg by mouth 2 (two) times daily.    . feeding supplement, ENSURE ENLIVE, (ENSURE ENLIVE) LIQD Take 237 mLs by mouth 2 (two) times daily between meals. 237 mL 12  . FeFum-FePoly-FA-B Cmp-C-Biot (FOLIVANE-PLUS) CAPS TAKE 1 CAPSULE BY MOUTH EVERY DAY IN THE MORNING 90 capsule 2  . furosemide (LASIX) 20 MG tablet Take 10 mg by mouth daily.     Marland Kitchen  letrozole (FEMARA) 2.5 MG tablet TAKE 1 TABLET BY MOUTH EVERY DAY (Patient taking differently: Take 2.5 mg by mouth daily. ) 90 tablet 1  . Multiple Vitamins-Iron (MULTIVITAMINS WITH IRON) TABS tablet Take 1 tablet by mouth daily. (Patient not taking: Reported on 08/30/2020)  0  . Multiple Vitamins-Minerals (PRESERVISION AREDS 2) CAPS Take 1 capsule by mouth in the morning and at bedtime.     Marland Kitchen osimertinib mesylate (TAGRISSO) 80 MG tablet Take 1 tablet (80 mg total) by mouth daily. 30 tablet 3  . prochlorperazine (COMPAZINE) 10 MG tablet Take 1 tablet (10 mg total) by mouth every 6 (six) hours as needed for nausea or vomiting. 30 tablet 0  . rivaroxaban (XARELTO) 20 MG TABS tablet Take 20 mg by mouth daily with supper.    . traMADol (ULTRAM) 50 MG tablet Take 1 tablet (50 mg total) by mouth  every 4 (four) hours as needed for moderate pain. 40 tablet 0   Current Facility-Administered Medications  Medication Dose Route Frequency Provider Last Rate Last Admin  . 0.9 %  sodium chloride infusion  500 mL Intravenous Continuous Armbruster, Carlota Raspberry, MD         Past Surgical History:  Procedure Laterality Date  . APPENDECTOMY    . BOWEL DECOMPRESSION N/A 06/12/2018   Procedure: BOWEL DECOMPRESSION;  Surgeon: Rush Landmark Telford Nab., MD;  Location: [facility];  Service: Gastroenterology;  Laterality: N/A;  . cataracts Bilateral   . COLECTOMY WITH COLOSTOMY CREATION/HARTMANN PROCEDURE  06/16/2018   Procedure: CREATION/HARTMANN COLOSTOMY;  Surgeon: Michael Boston, MD;  Location: [facility];  Service: General;;  . COLONOSCOPY    . COLOSTOMY N/A 06/16/2018   Procedure: POSSIBLE COLOSTOMY;  Surgeon: Michael Boston, MD;  Location: [facility];  Service: General;  Laterality: N/A;  . EYE SURGERY    . FLEXIBLE SIGMOIDOSCOPY N/A 06/12/2018   Procedure: FLEXIBLE SIGMOIDOSCOPY;  Surgeon: Rush Landmark Telford Nab., MD;  Location: [facility];  Service: Gastroenterology;  Laterality: N/A;  . LAPAROSCOPIC SIGMOID COLECTOMY N/A 06/16/2018   Procedure: LAPAROSCOPIC LOW ANTERIOR RESECTION;  Surgeon: Michael Boston, MD;  Location: [facility];  Service: General;  Laterality: N/A;  . LUNG SURGERY     Resection -" not done. Pt . tx with Tarceva  . MASTECTOMY Bilateral   . MEDIASTINOSCOPY N/A 10/02/2016   Procedure: MEDIASTINOSCOPY;  Surgeon: [name], MD;  Location: [facility] OR;  Service: Thoracic;  Laterality: N/A;  . RECONSTRUCTION BREAST W/ TRAM FLAP Bilateral    bilaterally  . robotic right hemicolectomy Right 03/06/2016   Dr. Johney Maine  . TEE WITHOUT CARDIOVERSION N/A 06/04/2013   Procedure: TRANSESOPHAGEAL ECHOCARDIOGRAM (TEE);  Surgeon: Jolaine Artist, MD;  Location: [facility] END[facility];  Service: Cardiovascular;  Laterality: N/A;  . TONSILLECTOMY    . TOTAL HIP ARTHROPLASTY Left 08/07/2016   Procedure: LEFT  TOTAL HIP ARTHROPLASTY ANTERIOR APPROACH;  Surgeon: Gaynelle Arabian, MD;  Location: [facility];  Service: Orthopedics;  Laterality: Left;  . TOTAL HIP ARTHROPLASTY Right 09/01/2020   Procedure: TOTAL HIP ARTHROPLASTY ANTERIOR APPROACH;  Surgeon: Rod Can, MD;  Location: [facility];  Service: Orthopedics;  Laterality: Right;     Allergies  Allergen Reactions  . Oxycodone Other (See Comments)    Not tolerate well  . Doxycycline Nausea Only    Weak, stomach  . Sulfa Antibiotics Rash      Family History  Problem Relation Age of Onset  . Lung cancer Father   . Heart disease Mother   . Clotting disorder Mother 51  coronary thrombosis  . Colon cancer Sister        dx in her 64s  . Breast cancer Sister 74  . Multiple myeloma Paternal Grandmother   . Brain cancer Paternal Uncle   . Cancer Paternal Aunt        unknown  . Colon cancer Maternal Aunt 80  . Prostate cancer Maternal Uncle        dx in his late 36s  . Other Maternal Uncle        brain tumor dx in his 33s  . Lung cancer Maternal Uncle   . Cancer Maternal Uncle        NOS  . Stomach cancer Paternal Aunt   . Brain cancer Cousin        maternal first cousin dx in late 31s-50s  . Cancer Paternal Uncle        NOS  . Colon cancer Cousin        paternal cousin dx in his 71s  . Cancer Cousin        paternal cousin dx with NOS cancer     Social History Ms. Polka reports that she has never smoked. She has never used smokeless tobacco. Ms. Hajduk reports current alcohol use.   Review of Systems CONSTITUTIONAL: No weight loss, fever, chills, weakness or fatigue.  HEENT: Eyes: No visual loss, blurred vision, double vision or yellow sclerae.No hearing loss, sneezing, congestion, runny nose or sore throat.  SKIN: No rash or itching.  CARDIOVASCULAR: per hpi RESPIRATORY: No shortness of breath, cough or sputum.  GASTROINTESTINAL: No anorexia, nausea, vomiting or diarrhea. No abdominal pain or blood.  GENITOURINARY: No  burning on urination, no polyuria NEUROLOGICAL: No headache, dizziness, syncope, paralysis, ataxia, numbness or tingling in the extremities. No change in bowel or bladder control.  MUSCULOSKELETAL: No muscle, back pain, joint pain or stiffness.  LYMPHATICS: No enlarged nodes. No history of splenectomy.  PSYCHIATRIC: No history of depression or anxiety.  ENDOCRINOLOGIC: No reports of sweating, cold or heat intolerance. No polyuria or polydipsia.  Marland Kitchen   Physical Examination Today's Vitals   10/12/20 1011  BP: (!) 142/70  Pulse: 75  SpO2: 97%  Weight: 123 lb (55.8 kg)  Height: 5' 9" (1.753 m)   Body mass index is 18.16 kg/m.  Gen: resting comfortably, no acute distress HEENT: no scleral icterus, pupils equal round and reactive, no palptable cervical adenopathy,  CV: RRR, no mr/g no jvd Resp: Clear to auscultation bilaterally GI: abdomen is soft, non-tender, non-distended, normal bowel sounds, no hepatosplenomegaly MSK: extremities are warm, no edema.  Skin: warm, no rash Neuro:  no focal deficits Psych: appropriate affect     Assessment and Plan  1. Afib - diagnosed in setting of PE and UTI, unclear if isolated or ongoing problem - we will d/c amio and monitor for recurrence - continue xarelto in setting of recent afib, also unprovoked PE  2. PE - from notes appears no clear transient cause. She does have history of multiple cancers.  - continue anticoag indefiniite. Appears she has also been maintained on plavix given prior history of CVA  3. HTN - above goal, start lisinopril 7m. Was to be on at discharge but don't see on her list and not taking. Check BMET in 2 weeks  F/u 2 months, reassess for any recurrent afib.     JArnoldo Lenis M.D.,

## 2020-10-16 ENCOUNTER — Inpatient Hospital Stay: Payer: Medicare Other

## 2020-10-16 ENCOUNTER — Inpatient Hospital Stay (HOSPITAL_BASED_OUTPATIENT_CLINIC_OR_DEPARTMENT_OTHER): Payer: Medicare Other | Admitting: Internal Medicine

## 2020-10-16 ENCOUNTER — Encounter: Payer: Self-pay | Admitting: Internal Medicine

## 2020-10-16 ENCOUNTER — Other Ambulatory Visit: Payer: Self-pay

## 2020-10-16 VITALS — BP 140/67 | HR 73 | Temp 97.0°F | Resp 19 | Ht 69.0 in | Wt 124.1 lb

## 2020-10-16 DIAGNOSIS — Z23 Encounter for immunization: Secondary | ICD-10-CM

## 2020-10-16 DIAGNOSIS — C3492 Malignant neoplasm of unspecified part of left bronchus or lung: Secondary | ICD-10-CM

## 2020-10-16 DIAGNOSIS — Z5111 Encounter for antineoplastic chemotherapy: Secondary | ICD-10-CM | POA: Diagnosis not present

## 2020-10-16 DIAGNOSIS — C3412 Malignant neoplasm of upper lobe, left bronchus or lung: Secondary | ICD-10-CM

## 2020-10-16 DIAGNOSIS — D638 Anemia in other chronic diseases classified elsewhere: Secondary | ICD-10-CM

## 2020-10-16 DIAGNOSIS — I4819 Other persistent atrial fibrillation: Secondary | ICD-10-CM

## 2020-10-16 NOTE — Progress Notes (Signed)
[facility] Telephone:([phone]) [phone]   Fax:(336) [phone]  OFFICE PROGRESS NOTE  Kristin Hong, MD [address] [place] [address]  DIAGNOSIS:  1) Metastatic non-small cell lung cancer, adenocarcinoma diagnosed in March 2012 with positive EGFR mutation in exon 21 ([id number]). 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 2) adenocarcinoma of the ascending colon (T2, N0, M0) with MSI high diagnosed in March 2017. 3) history of breast adenocarcinoma.  PRIOR THERAPY:  1) Tarceva 150 mg by mouth daily status post 68 months of treatment. 2) Status post right colectomy with lymph node dissection in May 2017.  CURRENT THERAPY: 1) Tagrisso 80 mg by mouth daily started 11/09/2016.  Status post 48 months of treatment. 2) observation for the history of colon adenocarcinoma. 3) Femara 2.5 mg by mouth daily for history of breast cancer.  INTERVAL HISTORY: Kristin [name] 79 y.o. female returns to the clinic today for follow-up visit accompanied by her daughter.  The patient is recovering from a surgical repair for a closed displaced fracture of the right femoral neck on September 05, 2020.  She underwent physical therapy.  She denied having any current chest pain, shortness of breath, cough or hemoptysis.  She denied having any fever or chills.  She denied having any recent weight loss or night sweats.  She has no headache or visual changes.  She continues to tolerate her treatment with Tagrisso fairly well.  She had repeat CT scan of the chest, abdomen pelvis as well as anemia panel performed recently and she is here for evaluation and discussion of her lab and scan results.  MEDICAL HISTORY: Past Medical History:  Diagnosis Date  . Allergy   . Anemia   . Anemia of chronic disease 09/21/2016  . Anxiety   . Arthritis    arthritis- left hip  . Blood transfusion without reported diagnosis    transfusion- 3-4 yrs ago -found to be anemic on routine lab  check  . Breast cancer ([facility]) 2002   bilateral- bilateral mastectomies done.  . Broken hip ([facility])   . Chronic kidney disease   . 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 CVA (cerebral infarction) 04/30/2013  . Dysrhythmia   . Encounter for antineoplastic chemotherapy 10/28/2016  . Family history of brain cancer   . Family history of breast cancer   . Family history of colon cancer 10/06/2009  . Family history of colon cancer   . Family history of prostate cancer   . GERD (gastroesophageal reflux disease)   . Glaucoma   . History of bilateral mastectomy 05/20/2010  . Hypertension   . Hypothyroidism   . Lung cancer ([facility]) dx'd 12/2010   last Ct scan "no lung cancer" showing 11'16 CT Chest Epic.  . Malignant neoplasm of ascending colon ([facility]) 01/29/2016  . Stroke [facility])    2 yrs ago-no residual  . Tubular adenoma of colon 07/2011   colon polyps ans reoccurence with malignancy found    ALLERGIES:  is allergic to oxycodone, doxycycline, and sulfa antibiotics.  MEDICATIONS:  Current Outpatient Medications  Medication Sig Dispense Refill  . acetaminophen (TYLENOL) 500 MG tablet Take 1,000 mg by mouth in the morning and at bedtime.    Marland Kitchen amLODipine (NORVASC) 10 MG tablet Take 1 tablet (10 mg total) by mouth daily. 30 tablet 0  . brimonidine-timolol (COMBIGAN) 0.2-0.5 % ophthalmic solution Place 1 drop into the right eye every 12 (twelve) hours.    . clopidogrel (PLAVIX) 75 MG tablet  Take 75 mg by mouth daily.    . cycloSPORINE (RESTASIS) 0.05 % ophthalmic emulsion Place 1 drop into both eyes 2 (two) times daily.    Marland Kitchen escitalopram (LEXAPRO) 10 MG tablet Take 10 mg by mouth daily.     . famotidine (PEPCID) 20 MG tablet Take 40 mg by mouth 2 (two) times daily.    . feeding supplement, ENSURE ENLIVE, (ENSURE ENLIVE) LIQD Take 237 mLs by mouth 2 (two) times daily between meals. 237 mL 12  . FeFum-FePoly-FA-B Cmp-C-Biot (FOLIVANE-PLUS) CAPS TAKE 1 CAPSULE BY MOUTH EVERY DAY IN THE MORNING 90 capsule 2  .  furosemide (LASIX) 20 MG tablet Take 10 mg by mouth daily.     Marland Kitchen letrozole (FEMARA) 2.5 MG tablet TAKE 1 TABLET BY MOUTH EVERY DAY (Patient taking differently: Take 2.5 mg by mouth daily.) 90 tablet 1  . lisinopril (ZESTRIL) 10 MG tablet Take 1 tablet (10 mg total) by mouth daily. 90 tablet 1  . Multiple Vitamins-Iron (MULTIVITAMINS WITH IRON) TABS tablet Take 1 tablet by mouth daily.  0  . Multiple Vitamins-Minerals (PRESERVISION AREDS 2) CAPS Take 1 capsule by mouth in the morning and at bedtime.     Marland Kitchen osimertinib mesylate (TAGRISSO) 80 MG tablet Take 1 tablet (80 mg total) by mouth daily. 30 tablet 3  . prochlorperazine (COMPAZINE) 10 MG tablet Take 1 tablet (10 mg total) by mouth every 6 (six) hours as needed for nausea or vomiting. 30 tablet 0  . rivaroxaban (XARELTO) 20 MG TABS tablet Take 20 mg by mouth daily with supper.    . traMADol (ULTRAM) 50 MG tablet Take 1 tablet (50 mg total) by mouth every 4 (four) hours as needed for moderate pain. 40 tablet 0   Current Facility-Administered Medications  Medication Dose Route Frequency Provider Last Rate Last Admin  . 0.9 %  sodium chloride infusion  500 mL Intravenous Continuous Armbruster, Carlota Raspberry, MD        SURGICAL HISTORY:  Past Surgical History:  Procedure Laterality Date  . APPENDECTOMY    . BOWEL DECOMPRESSION N/A 06/12/2018   Procedure: BOWEL DECOMPRESSION;  Surgeon: Rush Landmark Telford Nab., MD;  Location: [facility];  Service: Gastroenterology;  Laterality: N/A;  . cataracts Bilateral   . COLECTOMY WITH COLOSTOMY CREATION/HARTMANN PROCEDURE  06/16/2018   Procedure: CREATION/HARTMANN COLOSTOMY;  Surgeon: Michael [facility], MD;  Location: [facility];  Service: General;;  . COLONOSCOPY    . COLOSTOMY N/A 06/16/2018   Procedure: POSSIBLE COLOSTOMY;  Surgeon: Michael [facility], MD;  Location: [facility];  Service: General;  Laterality: N/A;  . EYE SURGERY    . FLEXIBLE SIGMOIDOSCOPY N/A 06/12/2018   Procedure: FLEXIBLE SIGMOIDOSCOPY;  Surgeon:  Rush Landmark Telford Nab., MD;  Location: [facility];  Service: Gastroenterology;  Laterality: N/A;  . LAPAROSCOPIC SIGMOID COLECTOMY N/A 06/16/2018   Procedure: LAPAROSCOPIC LOW ANTERIOR RESECTION;  Surgeon: Michael [place], MD;  Location: [facility];  Service: General;  Laterality: N/A;  . LUNG SURGERY     Resection -" not done. Pt . tx with Tarceva  . MASTECTOMY Bilateral   . MEDIASTINOSCOPY N/A 10/02/2016   Procedure: MEDIASTINOSCOPY;  Surgeon: [name], MD;  Location: [facility];  Service: Thoracic;  Laterality: N/A;  . RECONSTRUCTION BREAST W/ TRAM FLAP Bilateral    bilaterally  . robotic right hemicolectomy Right 03/06/2016   Dr. Johney Maine  . TEE WITHOUT CARDIOVERSION N/A 06/04/2013   Procedure: TRANSESOPHAGEAL ECHOCARDIOGRAM (TEE);  Surgeon: Jolaine Artist, MD;  Location: [facility];  Service: Cardiovascular;  Laterality: N/A;  . TONSILLECTOMY    . TOTAL HIP ARTHROPLASTY Left 08/07/2016   Procedure: LEFT TOTAL HIP ARTHROPLASTY ANTERIOR APPROACH;  Surgeon: Gaynelle Arabian, MD;  Location: [facility];  Service: Orthopedics;  Laterality: Left;  . TOTAL HIP ARTHROPLASTY Right 09/01/2020   Procedure: TOTAL HIP ARTHROPLASTY ANTERIOR APPROACH;  Surgeon: Rod Can, MD;  Location: [facility];  Service: Orthopedics;  Laterality: Right;    REVIEW OF SYSTEMS:  Constitutional: negative Eyes: negative Ears, nose, mouth, throat, and face: negative Respiratory: negative Cardiovascular: negative Gastrointestinal: negative Genitourinary:negative Integument/breast: negative Hematologic/lymphatic: negative Musculoskeletal:positive for muscle weakness Neurological: negative Behavioral/Psych: negative Endocrine: negative Allergic/Immunologic: negative   PHYSICAL EXAMINATION: General appearance: alert, cooperative, fatigued and no distress Head: Normocephalic, without obvious abnormality, atraumatic Neck: no adenopathy, no JVD, supple, symmetrical, trachea midline and thyroid not enlarged,  symmetric, no tenderness/mass/nodules Lymph nodes: Cervical, supraclavicular, and axillary nodes normal. Resp: clear to auscultation bilaterally Back: symmetric, no curvature. ROM normal. No CVA tenderness. Cardio: regular rate and rhythm, S1, S2 normal, no murmur, click, rub or gallop GI: soft, non-tender; bowel sounds normal; no masses,  no organomegaly Extremities: extremities normal, atraumatic, no cyanosis or edema Neurologic: Alert and oriented X 3, normal strength and tone. Normal symmetric reflexes. Normal coordination and gait  ECOG PERFORMANCE STATUS: 1 - Symptomatic but completely ambulatory  Blood pressure 140/67, pulse 73, temperature (!) 97 F (36.1 C), temperature source Tympanic, resp. rate 19, height _0  (1.753 m), weight 124 lb 1.6 oz (56.3 kg), SpO2 97 %.  LABORATORY DATA: Lab Results  Component Value Date   WBC 5.7 10/10/2020   HGB 11.5 (L) 10/10/2020   HCT 35.5 (L) 10/10/2020   MCV 92.2 10/10/2020   PLT 356 10/10/2020      Chemistry      Component Value Date/Time   NA 139 10/10/2020 1056   NA 138 09/26/2017 0942   K 3.9 10/10/2020 1056   K 5.2 No visable hemolysis (H) 09/26/2017 0942   CL 104 10/10/2020 1056   CL 107 04/05/2013 0754   CO2 28 10/10/2020 1056   CO2 26 09/26/2017 0942   BUN 22 10/10/2020 1056   BUN 16.0 09/26/2017 0942   CREATININE 1.13 (H) 10/10/2020 1056   CREATININE 1.2 (H) 09/26/2017 0942      Component Value Date/Time   CALCIUM 10.4 (H) 10/10/2020 1056   CALCIUM 9.9 09/26/2017 0942   ALKPHOS 128 (H) 10/10/2020 1056   ALKPHOS 68 09/26/2017 0942   AST 17 10/10/2020 1056   AST 18 09/26/2017 0942   ALT 13 10/10/2020 1056   ALT 13 09/26/2017 0942   BILITOT 0.3 10/10/2020 1056   BILITOT 0.31 09/26/2017 0942       RADIOGRAPHIC STUDIES: CT Chest W Contrast  Result Date: 10/10/2020 CLINICAL DATA:  Lung cancer restaging,, prior history of colon cancer, ongoing oral chemotherapy EXAM: CT CHEST, ABDOMEN, AND PELVIS WITH CONTRAST  TECHNIQUE: Multidetector CT imaging of the chest, abdomen and pelvis was performed following the standard protocol during bolus administration of intravenous contrast. CONTRAST:  56m OMNIPAQUE IOHEXOL 300 MG/ML SOLN, additional oral enteric contrast COMPARISON:  CT chest, 06/12/2020, PET-CT, 03/03/2020 FINDINGS: CT CHEST FINDINGS Cardiovascular: Aortic atherosclerosis. Normal heart size. Three-vessel coronary artery calcifications. No pericardial effusion. Mediastinum/Nodes: No enlarged mediastinal, hilar, or axillary lymph nodes. Large hiatal hernia with intrathoracic position of the gastric body and fundus. Heterogeneous, nodular thyroid, previously imaged and biopsied. This has been evaluated on previous imaging. (ref: J Am Coll Radiol. 2015 Feb;12(2): 143-50). Trachea, and esophagus  demonstrate no significant findings. Lungs/Pleura: Status post wedge resection of the left upper lobe. Significant interval increase in dense consolidation of the dependent left lower lobe. Redemonstrated small, loculated left pleural effusion with extensive pleural thickening about the lower lung. Musculoskeletal: Status post bilateral mastectomy. No chest wall mass or suspicious bone lesions identified. CT ABDOMEN PELVIS FINDINGS Hepatobiliary: No solid liver abnormality is seen. Unchanged simple cyst of the liver dome. No gallstones, gallbladder wall thickening, or biliary dilatation. Pancreas: Unremarkable. No pancreatic ductal dilatation or surrounding inflammatory changes. Spleen: Normal in size without significant abnormality. Adrenals/Urinary Tract: Adrenal glands are unremarkable. Kidneys are normal, without renal calculi, solid lesion, or hydronephrosis. Bladder is unremarkable. Stomach/Bowel: Stomach is within normal limits. Appendix is surgically absent. Redemonstrated postoperative findings of sigmoid colon resection with left lower quadrant end colostomy. Vascular/Lymphatic: Aortic atherosclerosis. No enlarged  abdominal or pelvic lymph nodes. Reproductive: Uterine fibroids. Other: No abdominal wall hernia or abnormality. No abdominopelvic ascites. Musculoskeletal: Status post bilateral hip total arthroplasty. IMPRESSION: 1. Redemonstrated postoperative findings status post wedge resection of the left upper lobe. 2. Significant interval increase in dense consolidation of the dependent left lower lobe. 3. Redemonstrated small, loculated left pleural effusion with extensive pleural thickening about the lower lung. 4. Findings suggest worsened pleural metastatic disease, previously FDG avid on PET examination dated 03/03/2020. 5. No evidence of distant metastatic disease in the abdomen or pelvis. 6. Redemonstrated postoperative findings of sigmoid colon resection with left lower quadrant end colostomy. 7. Large hiatal hernia with intrathoracic position of the gastric body and fundus. 8. Coronary artery disease. Aortic Atherosclerosis (ICD10-I70.0). Electronically Signed   By: Eddie Candle M.D.   On: 10/10/2020 16:01   CT Abdomen Pelvis W Contrast  Result Date: 10/10/2020 CLINICAL DATA:  Lung cancer restaging,, prior history of colon cancer, ongoing oral chemotherapy EXAM: CT CHEST, ABDOMEN, AND PELVIS WITH CONTRAST TECHNIQUE: Multidetector CT imaging of the chest, abdomen and pelvis was performed following the standard protocol during bolus administration of intravenous contrast. CONTRAST:  53m OMNIPAQUE IOHEXOL 300 MG/ML SOLN, additional oral enteric contrast COMPARISON:  CT chest, 06/12/2020, PET-CT, 03/03/2020 FINDINGS: CT CHEST FINDINGS Cardiovascular: Aortic atherosclerosis. Normal heart size. Three-vessel coronary artery calcifications. No pericardial effusion. Mediastinum/Nodes: No enlarged mediastinal, hilar, or axillary lymph nodes. Large hiatal hernia with intrathoracic position of the gastric body and fundus. Heterogeneous, nodular thyroid, previously imaged and biopsied. This has been evaluated on previous  imaging. (ref: J Am Coll Radiol. 2015 Feb;12(2): 143-50). Trachea, and esophagus demonstrate no significant findings. Lungs/Pleura: Status post wedge resection of the left upper lobe. Significant interval increase in dense consolidation of the dependent left lower lobe. Redemonstrated small, loculated left pleural effusion with extensive pleural thickening about the lower lung. Musculoskeletal: Status post bilateral mastectomy. No chest wall mass or suspicious bone lesions identified. CT ABDOMEN PELVIS FINDINGS Hepatobiliary: No solid liver abnormality is seen. Unchanged simple cyst of the liver dome. No gallstones, gallbladder wall thickening, or biliary dilatation. Pancreas: Unremarkable. No pancreatic ductal dilatation or surrounding inflammatory changes. Spleen: Normal in size without significant abnormality. Adrenals/Urinary Tract: Adrenal glands are unremarkable. Kidneys are normal, without renal calculi, solid lesion, or hydronephrosis. Bladder is unremarkable. Stomach/Bowel: Stomach is within normal limits. Appendix is surgically absent. Redemonstrated postoperative findings of sigmoid colon resection with left lower quadrant end colostomy. Vascular/Lymphatic: Aortic atherosclerosis. No enlarged abdominal or pelvic lymph nodes. Reproductive: Uterine fibroids. Other: No abdominal wall hernia or abnormality. No abdominopelvic ascites. Musculoskeletal: Status post bilateral hip total arthroplasty. IMPRESSION: 1. Redemonstrated postoperative findings status  post wedge resection of the left upper lobe. 2. Significant interval increase in dense consolidation of the dependent left lower lobe. 3. Redemonstrated small, loculated left pleural effusion with extensive pleural thickening about the lower lung. 4. Findings suggest worsened pleural metastatic disease, previously FDG avid on PET examination dated 03/03/2020. 5. No evidence of distant metastatic disease in the abdomen or pelvis. 6. Redemonstrated  postoperative findings of sigmoid colon resection with left lower quadrant end colostomy. 7. Large hiatal hernia with intrathoracic position of the gastric body and fundus. 8. Coronary artery disease. Aortic Atherosclerosis (ICD10-I70.0). Electronically Signed   By: Eddie Candle M.D.   On: 10/10/2020 16:01    ASSESSMENT AND PLAN:  This is a very pleasant 79 years old white female with metastatic non-small cell lung cancer, adenocarcinoma diagnosed in March 2012 with positive EGFR mutation status post 68 months of treatment with Tarceva discontinued secondary to disease progression and development of EGFR T790M resistant mutation. The patient is currently on treatment with Tagrisso 80 mg by mouth daily status post 48 months.   She continues to tolerate her treatment well with no concerning adverse effects. The patient had repeat CT scan of the chest, abdomen pelvis performed recently.  I personally and independently reviewed the scan images and discussed the results with the patient and her daughter today. Unfortunately her scan showed significant increase in the dense consolidation of the dependent left lower lobe with demonstration of a small loculated left pleural effusion and extensive pleural thickening about the lower lung concerning for worsening pleural metastatic disease. I discussed the treatment option with the patient and her daughter including switching her treatment to systemic chemotherapy but the patient declined this option and she is not interested in chemotherapy at this point. She would like to continue her current treatment with Tagrisso for now. I will see her back for follow-up visit in 2 months with repeat blood work. For pain management she will continue her current treatment with tramadol and Tylenol on as-needed basis. For the history of colon cancer she is currently on observation. For the history of breast cancer, she will continue her current treatment with Femara. For the  anemia of chronic disease/iron deficiency, she will continue her current treatment with the oral iron tablets and her hemoglobin and hematocrit are much better. The patient will receive her Covid booster shot today. The patient was advised to call immediately if she has any concerning symptoms in the interval. The patient voices understanding of current disease status and treatment options and is in agreement with the current care plan. All questions were answered. The patient knows to call the clinic with any problems, questions or concerns. We can certainly see the patient much sooner if necessary.  Disclaimer: This note was dictated with voice recognition software. Similar sounding words can inadvertently be transcribed and may not be corrected upon review.

## 2020-10-16 NOTE — Progress Notes (Signed)
    Covid-19 Vaccination Clinic  Name:  Audrina Marten    MRN: [id number] DOB: 1941/03/22  10/16/2020  Ms. Deloria was observed post Covid-19 immunization for 15 minutes without incident. She was provided with Vaccine Information Sheet and instruction to access the V-Safe system.   Ms. Gasser was instructed to call 911 with any severe reactions post vaccine: Marland Kitchen Difficulty breathing  . Swelling of face and throat  . A fast heartbeat  . A bad rash all over body  . Dizziness and weakness   Immunizations Administered    Name Date Dose VIS Date [place] COVID-19 Vaccine 10/16/2020 12:57 AM 0.3 mL 08/09/2020 Intramuscular   Manufacturer: [employer]   Lot: [id number]   [place]: [id number]

## 2020-10-17 ENCOUNTER — Telehealth: Payer: Self-pay | Admitting: Internal Medicine

## 2020-10-17 NOTE — Telephone Encounter (Signed)
Scheduled apt per 12/27 los - pt is aware of appt date and time

## 2020-11-27 ENCOUNTER — Inpatient Hospital Stay: Payer: Medicare Other | Attending: Internal Medicine | Admitting: Internal Medicine

## 2020-11-27 ENCOUNTER — Telehealth: Payer: Self-pay | Admitting: Internal Medicine

## 2020-11-27 ENCOUNTER — Inpatient Hospital Stay: Payer: Medicare Other

## 2020-11-27 ENCOUNTER — Other Ambulatory Visit: Payer: Self-pay

## 2020-11-27 VITALS — BP 123/73 | HR 102 | Temp 98.4°F | Resp 18 | Ht 69.0 in | Wt 117.9 lb

## 2020-11-27 DIAGNOSIS — Z7901 Long term (current) use of anticoagulants: Secondary | ICD-10-CM | POA: Insufficient documentation

## 2020-11-27 DIAGNOSIS — C3412 Malignant neoplasm of upper lobe, left bronchus or lung: Secondary | ICD-10-CM | POA: Insufficient documentation

## 2020-11-27 DIAGNOSIS — Z8673 Personal history of transient ischemic attack (TIA), and cerebral infarction without residual deficits: Secondary | ICD-10-CM | POA: Diagnosis not present

## 2020-11-27 DIAGNOSIS — Z79811 Long term (current) use of aromatase inhibitors: Secondary | ICD-10-CM | POA: Diagnosis not present

## 2020-11-27 DIAGNOSIS — N189 Chronic kidney disease, unspecified: Secondary | ICD-10-CM | POA: Diagnosis not present

## 2020-11-27 DIAGNOSIS — C3492 Malignant neoplasm of unspecified part of left bronchus or lung: Secondary | ICD-10-CM

## 2020-11-27 DIAGNOSIS — Z5111 Encounter for antineoplastic chemotherapy: Secondary | ICD-10-CM | POA: Diagnosis not present

## 2020-11-27 DIAGNOSIS — C3490 Malignant neoplasm of unspecified part of unspecified bronchus or lung: Secondary | ICD-10-CM

## 2020-11-27 DIAGNOSIS — Z79899 Other long term (current) drug therapy: Secondary | ICD-10-CM | POA: Diagnosis not present

## 2020-11-27 DIAGNOSIS — Z170 Estrogen receptor positive status [ER+]: Secondary | ICD-10-CM | POA: Diagnosis not present

## 2020-11-27 DIAGNOSIS — Z85038 Personal history of other malignant neoplasm of large intestine: Secondary | ICD-10-CM | POA: Insufficient documentation

## 2020-11-27 DIAGNOSIS — Z9013 Acquired absence of bilateral breasts and nipples: Secondary | ICD-10-CM | POA: Insufficient documentation

## 2020-11-27 DIAGNOSIS — C50919 Malignant neoplasm of unspecified site of unspecified female breast: Secondary | ICD-10-CM | POA: Diagnosis present

## 2020-11-27 DIAGNOSIS — F419 Anxiety disorder, unspecified: Secondary | ICD-10-CM | POA: Insufficient documentation

## 2020-11-27 DIAGNOSIS — D638 Anemia in other chronic diseases classified elsewhere: Secondary | ICD-10-CM | POA: Insufficient documentation

## 2020-11-27 LAB — CMP (CANCER CENTER ONLY)
ALT: 14 U/L (ref 0–44)
AST: 17 U/L (ref 15–41)
Albumin: 3.5 g/dL (ref 3.5–5.0)
Alkaline Phosphatase: 128 U/L — ABNORMAL HIGH (ref 38–126)
Anion gap: 9 (ref 5–15)
BUN: 16 mg/dL (ref 8–23)
CO2: 25 mmol/L (ref 22–32)
Calcium: 10 mg/dL (ref 8.9–10.3)
Chloride: 107 mmol/L (ref 98–111)
Creatinine: 1.13 mg/dL — ABNORMAL HIGH (ref 0.44–1.00)
GFR, Estimated: 49 mL/min — ABNORMAL LOW (ref >60)
Glucose, Bld: 101 mg/dL — ABNORMAL HIGH (ref 70–99)
Potassium: 4.3 mmol/L (ref 3.5–5.1)
Sodium: 141 mmol/L (ref 135–145)
Total Bilirubin: 0.3 mg/dL (ref 0.3–1.2)
Total Protein: 6.8 g/dL (ref 6.5–8.1)

## 2020-11-27 LAB — CBC WITH DIFFERENTIAL (CANCER CENTER ONLY)
Abs Immature Granulocytes: 0.01 10*3/uL (ref 0.00–0.07)
Basophils Absolute: 0 10*3/uL (ref 0.0–0.1)
Basophils Relative: 1 %
Eosinophils Absolute: 0.1 10*3/uL (ref 0.0–0.5)
Eosinophils Relative: 1 %
HCT: 36.1 % (ref 36.0–46.0)
Hemoglobin: 11.4 g/dL — ABNORMAL LOW (ref 12.0–15.0)
Immature Granulocytes: 0 %
Lymphocytes Relative: 12 %
Lymphs Abs: 0.8 10*3/uL (ref 0.7–4.0)
MCH: 30.6 pg (ref 26.0–34.0)
MCHC: 31.6 g/dL (ref 30.0–36.0)
MCV: 96.8 fL (ref 80.0–100.0)
Monocytes Absolute: 0.8 10*3/uL (ref 0.1–1.0)
Monocytes Relative: 13 %
Neutro Abs: 4.4 10*3/uL (ref 1.7–7.7)
Neutrophils Relative %: 73 %
Platelet Count: 354 10*3/uL (ref 150–400)
RBC: 3.73 MIL/uL — ABNORMAL LOW (ref 3.87–5.11)
RDW: 14.7 % (ref 11.5–15.5)
WBC Count: 6.1 10*3/uL (ref 4.0–10.5)
nRBC: 0 % (ref 0.0–0.2)

## 2020-11-27 NOTE — Telephone Encounter (Signed)
Scheduled appointments per 2/7 los. Spoke to patient who is aware of appointments dates and times. Gave patient calendar print out.

## 2020-11-27 NOTE — Progress Notes (Signed)
[facility] [phone]   [phone]  OFFICE PROGRESS NOTE  Kristin Hong, MD [address] [place] [address]  DIAGNOSIS:  1) Metastatic non-small cell lung cancer, adenocarcinoma diagnosed in March 2012 with positive EGFR mutation in exon 21 (L858R). 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 2) adenocarcinoma of the ascending colon (T2, N0, M0) with MSI high diagnosed in March 2017. 3) history of breast adenocarcinoma.  PRIOR THERAPY:  1) Tarceva 150 mg by mouth daily status post 68 months of treatment. 2) Status post right colectomy with lymph node dissection in May 2017.  CURRENT THERAPY: 1) Tagrisso 80 mg by mouth daily started 11/09/2016.  Status post 50 months of treatment. 2) Observation for the history of colon adenocarcinoma. 3) Femara 2.5 mg by mouth daily for history of breast cancer.  INTERVAL HISTORY: Kristin [name] 80 y.o. female returns to the clinic today for follow-up visit accompanied by her daughter Kristin [name]. The patient is feeling fine today with no concerning complaints except for intermittent pain on the left side of the chest. She has no cough, shortness of breath or hemoptysis. She has no fever or chills. She has no nausea, vomiting, diarrhea or constipation. She continues to tolerate her treatment with Tagrisso fairly well. The patient is here today for evaluation and repeat blood work.    MEDICAL HISTORY: Past Medical History:  Diagnosis Date  . Allergy   . Anemia   . Anemia of chronic disease 09/21/2016  . Anxiety   . Arthritis    arthritis- left hip  . Blood transfusion without reported diagnosis    transfusion- 3-4 yrs ago -found to be anemic on routine lab check  . Breast cancer ([place]) 2002   bilateral- bilateral mastectomies done.  . Broken hip ([place])   . Chronic kidney disease   . 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 CVA (cerebral infarction) 04/30/2013  . Dysrhythmia   .  Encounter for antineoplastic chemotherapy 10/28/2016  . Family history of brain cancer   . Family history of breast cancer   . Family history of colon cancer 10/06/2009  . Family history of colon cancer   . Family history of prostate cancer   . GERD (gastroesophageal reflux disease)   . Glaucoma   . History of bilateral mastectomy 05/20/2010  . Hypertension   . Hypothyroidism   . Lung cancer ([facility]) dx'd 12/2010   last Ct scan "no lung cancer" showing 11'16 CT Chest Epic.  . Malignant neoplasm of ascending colon ([place]) 01/29/2016  . Stroke [facility])    2 yrs ago-no residual  . Tubular adenoma of colon 07/2011   colon polyps ans reoccurence with malignancy found    ALLERGIES:  is allergic to oxycodone, doxycycline, and sulfa antibiotics.  MEDICATIONS:  Current Outpatient Medications  Medication Sig Dispense Refill  . acetaminophen (TYLENOL) 500 MG tablet Take 1,000 mg by mouth in the morning and at bedtime.    Marland Kitchen amLODipine (NORVASC) 10 MG tablet Take 1 tablet (10 mg total) by mouth daily. 30 tablet 0  . brimonidine-timolol (COMBIGAN) 0.2-0.5 % ophthalmic solution Place 1 drop into the right eye every 12 (twelve) hours.    . clopidogrel (PLAVIX) 75 MG tablet Take 75 mg by mouth daily.    . cycloSPORINE (RESTASIS) 0.05 % ophthalmic emulsion Place 1 drop into both eyes 2 (two) times daily.    Marland Kitchen escitalopram (LEXAPRO) 10 MG tablet Take 10 mg by mouth daily.     Marland Kitchen  famotidine (PEPCID) 20 MG tablet Take 40 mg by mouth 2 (two) times daily.    . feeding supplement, ENSURE ENLIVE, (ENSURE ENLIVE) LIQD Take 237 mLs by mouth 2 (two) times daily between meals. 237 mL 12  . FeFum-FePoly-FA-B Cmp-C-Biot (FOLIVANE-PLUS) CAPS TAKE 1 CAPSULE BY MOUTH EVERY DAY IN THE MORNING 90 capsule 2  . furosemide (LASIX) 20 MG tablet Take 10 mg by mouth daily.     Marland Kitchen letrozole (FEMARA) 2.5 MG tablet TAKE 1 TABLET BY MOUTH EVERY DAY (Patient taking differently: Take 2.5 mg by mouth daily.) 90 tablet 1  . lisinopril (ZESTRIL)  10 MG tablet Take 1 tablet (10 mg total) by mouth daily. 90 tablet 1  . Multiple Vitamins-Iron (MULTIVITAMINS WITH IRON) TABS tablet Take 1 tablet by mouth daily.  0  . Multiple Vitamins-Minerals (PRESERVISION AREDS 2) CAPS Take 1 capsule by mouth in the morning and at bedtime.     Marland Kitchen osimertinib mesylate (TAGRISSO) 80 MG tablet Take 1 tablet (80 mg total) by mouth daily. 30 tablet 3  . prochlorperazine (COMPAZINE) 10 MG tablet Take 1 tablet (10 mg total) by mouth every 6 (six) hours as needed for nausea or vomiting. 30 tablet 0  . rivaroxaban (XARELTO) 20 MG TABS tablet Take 20 mg by mouth daily with supper.    . traMADol (ULTRAM) 50 MG tablet Take 1 tablet (50 mg total) by mouth every 4 (four) hours as needed for moderate pain. 40 tablet 0   Current Facility-Administered Medications  Medication Dose Route Frequency Provider Last Rate Last Admin  . 0.9 %  sodium chloride infusion  500 mL Intravenous Continuous Armbruster, Carlota Raspberry, MD        SURGICAL HISTORY:  Past Surgical History:  Procedure Laterality Date  . APPENDECTOMY    . BOWEL DECOMPRESSION N/A 06/12/2018   Procedure: BOWEL DECOMPRESSION;  Surgeon: Rush Landmark Telford Nab., MD;  Location: [facility];  Service: Gastroenterology;  Laterality: N/A;  . cataracts Bilateral   . COLECTOMY WITH COLOSTOMY CREATION/HARTMANN PROCEDURE  06/16/2018   Procedure: CREATION/HARTMANN COLOSTOMY;  Surgeon: Michael Boston, MD;  Location: [facility];  Service: General;;  . COLONOSCOPY    . COLOSTOMY N/A 06/16/2018   Procedure: POSSIBLE COLOSTOMY;  Surgeon: Michael Boston, MD;  Location: [facility];  Service: General;  Laterality: N/A;  . EYE SURGERY    . FLEXIBLE SIGMOIDOSCOPY N/A 06/12/2018   Procedure: FLEXIBLE SIGMOIDOSCOPY;  Surgeon: Rush Landmark Telford Nab., MD;  Location: [facility];  Service: Gastroenterology;  Laterality: N/A;  . LAPAROSCOPIC SIGMOID COLECTOMY N/A 06/16/2018   Procedure: LAPAROSCOPIC LOW ANTERIOR RESECTION;  Surgeon: Michael Boston, MD;   Location: [facility];  Service: General;  Laterality: N/A;  . LUNG SURGERY     Resection -" not done. Pt . tx with Tarceva  . MASTECTOMY Bilateral   . MEDIASTINOSCOPY N/A 10/02/2016   Procedure: MEDIASTINOSCOPY;  Surgeon: [name], MD;  Location: [facility] OR;  Service: Thoracic;  Laterality: N/A;  . RECONSTRUCTION BREAST W/ TRAM FLAP Bilateral    bilaterally  . robotic right hemicolectomy Right 03/06/2016   Dr. Johney Maine  . TEE WITHOUT CARDIOVERSION N/A 06/04/2013   Procedure: TRANSESOPHAGEAL ECHOCARDIOGRAM (TEE);  Surgeon: Jolaine Artist, MD;  Location: [facility];  Service: Cardiovascular;  Laterality: N/A;  . TONSILLECTOMY    . TOTAL HIP ARTHROPLASTY Left 08/07/2016   Procedure: LEFT TOTAL HIP ARTHROPLASTY ANTERIOR APPROACH;  Surgeon: Gaynelle Arabian, MD;  Location: [facility];  Service: Orthopedics;  Laterality: Left;  . TOTAL HIP ARTHROPLASTY Right 09/01/2020  Procedure: TOTAL HIP ARTHROPLASTY ANTERIOR APPROACH;  Surgeon: Rod Can, MD;  Location: [facility];  Service: Orthopedics;  Laterality: Right;    REVIEW OF SYSTEMS:  A comprehensive review of systems was negative except for: Respiratory: positive for pleurisy/chest pain   PHYSICAL EXAMINATION: General appearance: alert, cooperative, fatigued and no distress Head: Normocephalic, without obvious abnormality, atraumatic Neck: no adenopathy, no JVD, supple, symmetrical, trachea midline and thyroid not enlarged, symmetric, no tenderness/mass/nodules Lymph nodes: Cervical, supraclavicular, and axillary nodes normal. Resp: clear to auscultation bilaterally Back: symmetric, no curvature. ROM normal. No CVA tenderness. Cardio: regular rate and rhythm, S1, S2 normal, no murmur, click, rub or gallop GI: soft, non-tender; bowel sounds normal; no masses,  no organomegaly Extremities: extremities normal, atraumatic, no cyanosis or edema  ECOG PERFORMANCE STATUS: 1 - Symptomatic but completely ambulatory  Blood pressure 123/73, pulse  (!) 102, temperature 98.4 F (36.9 C), temperature source Tympanic, resp. rate 18, height 5' 9"  (1.753 m), weight 117 lb 14.4 oz (53.5 kg), SpO2 98 %.  LABORATORY DATA: Lab Results  Component Value Date   WBC 6.1 11/27/2020   HGB 11.4 (L) 11/27/2020   HCT 36.1 11/27/2020   MCV 96.8 11/27/2020   PLT 354 11/27/2020      Chemistry      Component Value Date/Time   NA 141 11/27/2020 1127   NA 138 09/26/2017 0942   K 4.3 11/27/2020 1127   K 5.2 No visable hemolysis (H) 09/26/2017 0942   CL 107 11/27/2020 1127   CL 107 04/05/2013 0754   CO2 25 11/27/2020 1127   CO2 26 09/26/2017 0942   BUN 16 11/27/2020 1127   BUN 16.0 09/26/2017 0942   CREATININE 1.13 (H) 11/27/2020 1127   CREATININE 1.2 (H) 09/26/2017 0942      Component Value Date/Time   CALCIUM 10.0 11/27/2020 1127   CALCIUM 9.9 09/26/2017 0942   ALKPHOS 128 (H) 11/27/2020 1127   ALKPHOS 68 09/26/2017 0942   AST 17 11/27/2020 1127   AST 18 09/26/2017 0942   ALT 14 11/27/2020 1127   ALT 13 09/26/2017 0942   BILITOT 0.3 11/27/2020 1127   BILITOT 0.31 09/26/2017 0942       RADIOGRAPHIC STUDIES: No results found.  ASSESSMENT AND PLAN:  This is a very pleasant 80 years old white female with metastatic non-small cell lung cancer, adenocarcinoma diagnosed in March 2012 with positive EGFR mutation status post 68 months of treatment with Tarceva discontinued secondary to disease progression and development of EGFR T790M resistant mutation. The patient is currently on treatment with Tagrisso 80 mg by mouth daily status post 50 months.   The patient continues to tolerate her treatment fairly well with no concerning adverse effects. I recommended for her to continue on Tagrisso 80 mg p.o. daily as planned. I will see her back for follow-up visit in 2 months for evaluation with repeat CT scan of the chest, abdomen pelvis for restaging of her disease. For pain management she will continue her current treatment with tramadol and  Tylenol on as-needed basis. For the history of colon cancer she is currently on observation. For the history of breast cancer, she will continue her current treatment with Femara. For the anemia of chronic disease/iron deficiency, she will continue her current treatment with the oral iron tablets and her hemoglobin and hematocrit are much better. The patient was advised to call immediately if she has any concerning symptoms in the interval. The patient voices understanding of current disease status and treatment  options and is in agreement with the current care plan. All questions were answered. The patient knows to call the clinic with any problems, questions or concerns. We can certainly see the patient much sooner if necessary.  Disclaimer: This note was dictated with voice recognition software. Similar sounding words can inadvertently be transcribed and may not be corrected upon review.

## 2020-12-05 ENCOUNTER — Telehealth: Payer: Self-pay | Admitting: Medical Oncology

## 2020-12-05 ENCOUNTER — Other Ambulatory Visit: Payer: Self-pay | Admitting: Internal Medicine

## 2020-12-05 MED ORDER — TRAMADOL HCL 50 MG PO TABS: 100.0000 mg | ORAL_TABLET | ORAL | 0 refills | Status: DC | PRN

## 2020-12-05 MED ORDER — TRAMADOL HCL 100 MG PO TABS: 100.0000 mg | ORAL_TABLET | Freq: Three times a day (TID) | ORAL | 0 refills | Status: DC | PRN

## 2020-12-05 NOTE — Telephone Encounter (Signed)
She can increase the dose to 100 mg but still every 6 hours.  She can take Tylenol or occasional ibuprofen in between if needed.  Thank you.

## 2020-12-05 NOTE — Telephone Encounter (Signed)
   Tramadol  Refill is requested to get the correct dose.

## 2020-12-05 NOTE — Telephone Encounter (Signed)
Pain management Pain escalating on left side and around to her back .   Tramadol 50 q 6 and tylenol is not working to control her pain.  "Will Dr Julien Nordmann increase frequency?"

## 2020-12-15 ENCOUNTER — Ambulatory Visit: Payer: BLUE CROSS/BLUE SHIELD | Admitting: Cardiology

## 2020-12-18 ENCOUNTER — Telehealth: Payer: Self-pay | Admitting: Medical Oncology

## 2020-12-18 NOTE — Telephone Encounter (Signed)
.   Pt received a letter from [facility] that they are no longer paying  for her Tramadol.   Per [facility] they will have to run the next rx through the insurance to see if there is a payment issue with insurance.  Ronnie notified.

## 2020-12-22 ENCOUNTER — Other Ambulatory Visit: Payer: Self-pay | Admitting: Internal Medicine

## 2020-12-22 DIAGNOSIS — C50112 Malignant neoplasm of central portion of left female breast: Secondary | ICD-10-CM

## 2020-12-27 ENCOUNTER — Other Ambulatory Visit: Payer: Self-pay | Admitting: Internal Medicine

## 2020-12-27 ENCOUNTER — Telehealth: Payer: Self-pay | Admitting: Medical Oncology

## 2020-12-27 MED ORDER — TRAMADOL HCL 100 MG PO TABS: 100.0000 mg | ORAL_TABLET | Freq: Three times a day (TID) | ORAL | 0 refills | Status: AC | PRN

## 2020-12-27 NOTE — Telephone Encounter (Signed)
Requests refill  for tramadol.  She will be out of this medication by next Monday 01/01/2021. I lvm for Mr Kristin [name] that I requested the refill from Dr Julien Nordmann. However ,due to pharmacy regulations , Kristin [name] may not be able to pick it up until next  Monday .

## 2021-01-03 ENCOUNTER — Telehealth: Payer: Self-pay | Admitting: Medical Oncology

## 2021-01-03 ENCOUNTER — Other Ambulatory Visit: Payer: Self-pay | Admitting: Internal Medicine

## 2021-01-03 NOTE — Telephone Encounter (Signed)
Told Kim that [facility] scans are moved up and to expect a call from the scheduler.

## 2021-01-04 ENCOUNTER — Telehealth: Payer: Self-pay | Admitting: Internal Medicine

## 2021-01-04 NOTE — Telephone Encounter (Signed)
Scheduled per 03/16 scheduled message, patient has been called and notified.

## 2021-01-05 ENCOUNTER — Ambulatory Visit (HOSPITAL_COMMUNITY)
Admission: RE | Admit: 2021-01-05 | Discharge: 2021-01-05 | Disposition: A | Discharge status: Home / Self Care | Payer: Medicare Other | Source: Ambulatory Visit | Attending: Internal Medicine | Admitting: Internal Medicine

## 2021-01-05 ENCOUNTER — Inpatient Hospital Stay: Payer: Medicare Other | Attending: Internal Medicine

## 2021-01-05 ENCOUNTER — Other Ambulatory Visit: Payer: Self-pay

## 2021-01-05 ENCOUNTER — Encounter (HOSPITAL_COMMUNITY): Payer: Self-pay

## 2021-01-05 DIAGNOSIS — Z800 Family history of malignant neoplasm of digestive organs: Secondary | ICD-10-CM | POA: Insufficient documentation

## 2021-01-05 DIAGNOSIS — E860 Dehydration: Secondary | ICD-10-CM | POA: Insufficient documentation

## 2021-01-05 DIAGNOSIS — Z79899 Other long term (current) drug therapy: Secondary | ICD-10-CM | POA: Insufficient documentation

## 2021-01-05 DIAGNOSIS — Z8673 Personal history of transient ischemic attack (TIA), and cerebral infarction without residual deficits: Secondary | ICD-10-CM | POA: Insufficient documentation

## 2021-01-05 DIAGNOSIS — E039 Hypothyroidism, unspecified: Secondary | ICD-10-CM | POA: Insufficient documentation

## 2021-01-05 DIAGNOSIS — I10 Essential (primary) hypertension: Secondary | ICD-10-CM | POA: Insufficient documentation

## 2021-01-05 DIAGNOSIS — C50919 Malignant neoplasm of unspecified site of unspecified female breast: Secondary | ICD-10-CM | POA: Insufficient documentation

## 2021-01-05 DIAGNOSIS — Z7901 Long term (current) use of anticoagulants: Secondary | ICD-10-CM | POA: Insufficient documentation

## 2021-01-05 DIAGNOSIS — Z170 Estrogen receptor positive status [ER+]: Secondary | ICD-10-CM | POA: Insufficient documentation

## 2021-01-05 DIAGNOSIS — C3490 Malignant neoplasm of unspecified part of unspecified bronchus or lung: Secondary | ICD-10-CM | POA: Diagnosis not present

## 2021-01-05 DIAGNOSIS — Z803 Family history of malignant neoplasm of breast: Secondary | ICD-10-CM | POA: Insufficient documentation

## 2021-01-05 DIAGNOSIS — Z9013 Acquired absence of bilateral breasts and nipples: Secondary | ICD-10-CM | POA: Insufficient documentation

## 2021-01-05 DIAGNOSIS — Z79811 Long term (current) use of aromatase inhibitors: Secondary | ICD-10-CM | POA: Insufficient documentation

## 2021-01-05 DIAGNOSIS — Z85038 Personal history of other malignant neoplasm of large intestine: Secondary | ICD-10-CM | POA: Insufficient documentation

## 2021-01-05 DIAGNOSIS — M8580 Other specified disorders of bone density and structure, unspecified site: Secondary | ICD-10-CM | POA: Insufficient documentation

## 2021-01-05 DIAGNOSIS — C3412 Malignant neoplasm of upper lobe, left bronchus or lung: Secondary | ICD-10-CM | POA: Insufficient documentation

## 2021-01-05 LAB — CMP (CANCER CENTER ONLY)
ALT: 12 U/L (ref 0–44)
AST: 15 U/L (ref 15–41)
Albumin: 3.6 g/dL (ref 3.5–5.0)
Alkaline Phosphatase: 172 U/L — ABNORMAL HIGH (ref 38–126)
Anion gap: 6 (ref 5–15)
BUN: 24 mg/dL — ABNORMAL HIGH (ref 8–23)
CO2: 27 mmol/L (ref 22–32)
Calcium: 10.4 mg/dL — ABNORMAL HIGH (ref 8.9–10.3)
Chloride: 105 mmol/L (ref 98–111)
Creatinine: 1.15 mg/dL — ABNORMAL HIGH (ref 0.44–1.00)
GFR, Estimated: 48 mL/min — ABNORMAL LOW (ref >60)
Glucose, Bld: 123 mg/dL — ABNORMAL HIGH (ref 70–99)
Potassium: 4.8 mmol/L (ref 3.5–5.1)
Sodium: 138 mmol/L (ref 135–145)
Total Bilirubin: 0.3 mg/dL (ref 0.3–1.2)
Total Protein: 6.9 g/dL (ref 6.5–8.1)

## 2021-01-05 LAB — CBC WITH DIFFERENTIAL (CANCER CENTER ONLY)
Abs Immature Granulocytes: 0.01 10*3/uL (ref 0.00–0.07)
Basophils Absolute: 0 10*3/uL (ref 0.0–0.1)
Basophils Relative: 1 %
Eosinophils Absolute: 0.1 10*3/uL (ref 0.0–0.5)
Eosinophils Relative: 2 %
HCT: 38.3 % (ref 36.0–46.0)
Hemoglobin: 12.1 g/dL (ref 12.0–15.0)
Immature Granulocytes: 0 %
Lymphocytes Relative: 20 %
Lymphs Abs: 1.2 10*3/uL (ref 0.7–4.0)
MCH: 29.6 pg (ref 26.0–34.0)
MCHC: 31.6 g/dL (ref 30.0–36.0)
MCV: 93.6 fL (ref 80.0–100.0)
Monocytes Absolute: 0.9 10*3/uL (ref 0.1–1.0)
Monocytes Relative: 15 %
Neutro Abs: 3.7 10*3/uL (ref 1.7–7.7)
Neutrophils Relative %: 62 %
Platelet Count: 352 10*3/uL (ref 150–400)
RBC: 4.09 MIL/uL (ref 3.87–5.11)
RDW: 13.6 % (ref 11.5–15.5)
WBC Count: 6 10*3/uL (ref 4.0–10.5)
nRBC: 0 % (ref 0.0–0.2)

## 2021-01-05 MED ORDER — IOHEXOL 300 MG/ML  SOLN
75.0000 mL | Freq: Once | INTRAMUSCULAR | Status: AC | PRN
  Administered 2021-01-05: 75 mL via INTRAVENOUS

## 2021-01-09 ENCOUNTER — Other Ambulatory Visit: Payer: Self-pay | Admitting: Medical Oncology

## 2021-01-09 ENCOUNTER — Other Ambulatory Visit: Payer: Self-pay

## 2021-01-09 ENCOUNTER — Inpatient Hospital Stay (HOSPITAL_BASED_OUTPATIENT_CLINIC_OR_DEPARTMENT_OTHER): Payer: Medicare Other | Admitting: Internal Medicine

## 2021-01-09 ENCOUNTER — Inpatient Hospital Stay: Payer: Medicare Other

## 2021-01-09 VITALS — BP 136/66 | HR 95 | Temp 97.5°F | Resp 15 | Ht 69.0 in | Wt 106.9 lb

## 2021-01-09 DIAGNOSIS — Z5111 Encounter for antineoplastic chemotherapy: Secondary | ICD-10-CM | POA: Diagnosis not present

## 2021-01-09 DIAGNOSIS — C50919 Malignant neoplasm of unspecified site of unspecified female breast: Secondary | ICD-10-CM | POA: Diagnosis present

## 2021-01-09 DIAGNOSIS — C182 Malignant neoplasm of ascending colon: Secondary | ICD-10-CM

## 2021-01-09 DIAGNOSIS — Z803 Family history of malignant neoplasm of breast: Secondary | ICD-10-CM | POA: Diagnosis not present

## 2021-01-09 DIAGNOSIS — Z170 Estrogen receptor positive status [ER+]: Secondary | ICD-10-CM | POA: Diagnosis not present

## 2021-01-09 DIAGNOSIS — C50112 Malignant neoplasm of central portion of left female breast: Secondary | ICD-10-CM

## 2021-01-09 DIAGNOSIS — E860 Dehydration: Secondary | ICD-10-CM

## 2021-01-09 DIAGNOSIS — C50111 Malignant neoplasm of central portion of right female breast: Secondary | ICD-10-CM

## 2021-01-09 DIAGNOSIS — Z79899 Other long term (current) drug therapy: Secondary | ICD-10-CM | POA: Diagnosis not present

## 2021-01-09 DIAGNOSIS — Z79811 Long term (current) use of aromatase inhibitors: Secondary | ICD-10-CM | POA: Diagnosis not present

## 2021-01-09 DIAGNOSIS — C3412 Malignant neoplasm of upper lobe, left bronchus or lung: Secondary | ICD-10-CM | POA: Diagnosis present

## 2021-01-09 DIAGNOSIS — Z8673 Personal history of transient ischemic attack (TIA), and cerebral infarction without residual deficits: Secondary | ICD-10-CM | POA: Diagnosis not present

## 2021-01-09 DIAGNOSIS — Z800 Family history of malignant neoplasm of digestive organs: Secondary | ICD-10-CM | POA: Diagnosis not present

## 2021-01-09 DIAGNOSIS — D638 Anemia in other chronic diseases classified elsewhere: Secondary | ICD-10-CM

## 2021-01-09 DIAGNOSIS — Z9013 Acquired absence of bilateral breasts and nipples: Secondary | ICD-10-CM | POA: Diagnosis not present

## 2021-01-09 DIAGNOSIS — I10 Essential (primary) hypertension: Secondary | ICD-10-CM | POA: Diagnosis not present

## 2021-01-09 DIAGNOSIS — E039 Hypothyroidism, unspecified: Secondary | ICD-10-CM | POA: Diagnosis not present

## 2021-01-09 DIAGNOSIS — M8580 Other specified disorders of bone density and structure, unspecified site: Secondary | ICD-10-CM | POA: Diagnosis not present

## 2021-01-09 DIAGNOSIS — Z7901 Long term (current) use of anticoagulants: Secondary | ICD-10-CM | POA: Diagnosis not present

## 2021-01-09 DIAGNOSIS — Z85038 Personal history of other malignant neoplasm of large intestine: Secondary | ICD-10-CM | POA: Diagnosis not present

## 2021-01-09 MED ORDER — SODIUM CHLORIDE 0.9 % IV SOLN
INTRAVENOUS | Status: DC
  Filled 2021-01-09: qty 250

## 2021-01-09 MED ORDER — SODIUM CHLORIDE 0.9 % IV SOLN
Freq: Once | INTRAVENOUS | Status: AC
  Filled 2021-01-09: qty 250

## 2021-01-09 NOTE — Progress Notes (Signed)
[place] [phone]   [phone]  OFFICE PROGRESS NOTE  Eber Hong, MD [address] [place] [address]  DIAGNOSIS:  1) Metastatic non-small cell lung cancer, adenocarcinoma diagnosed in March 2012 with positive EGFR mutation in exon 21 ([id number]). 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 2) adenocarcinoma of the ascending colon (T2, N0, M0) with MSI high diagnosed in March 2017. 3) history of breast adenocarcinoma.  PRIOR THERAPY:  1) Tarceva 150 mg by mouth daily status post 68 months of treatment. 2) Status post right colectomy with lymph node dissection in May 2017.  CURRENT THERAPY: 1) Tagrisso 80 mg by mouth daily started 11/09/2016.  Status post 50 months of treatment. 2) Observation for the history of colon adenocarcinoma. 3) Femara 2.5 mg by mouth daily for history of breast cancer.  INTERVAL HISTORY: Kristin [name] 80 y.o. female returns to the clinic today for follow-up visit.  The patient is feeling fine today with no concerning complaints except for the increasing fatigue and weakness.  She also lost around 12 pounds since her last visit.  She eats very little these days.  She continues to have left-sided chest pain and she is using tramadol on as-needed basis.  The patient has shortness of breath with exertion with mild cough and no hemoptysis.  She denied having any fever or chills.  She has no nausea, vomiting, diarrhea or constipation.  She has no headache or visual changes.  She had repeat CT scan of the chest, abdomen pelvis performed recently and she is here for evaluation and discussion of her scan results.  MEDICAL HISTORY: Past Medical History:  Diagnosis Date  . Allergy   . Anemia   . Anemia of chronic disease 09/21/2016  . Anxiety   . Arthritis    arthritis- left hip  . Blood transfusion without reported diagnosis    transfusion- 3-4 yrs ago -found to be anemic on routine lab  check  . Breast cancer ([place]) 2002   bilateral- bilateral mastectomies done.  . Broken hip ([place])   . Chronic kidney disease   . 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 CVA (cerebral infarction) 04/30/2013  . Dysrhythmia   . Encounter for antineoplastic chemotherapy 10/28/2016  . Family history of brain cancer   . Family history of breast cancer   . Family history of colon cancer 10/06/2009  . Family history of colon cancer   . Family history of prostate cancer   . GERD (gastroesophageal reflux disease)   . Glaucoma   . History of bilateral mastectomy 05/20/2010  . Hypertension   . Hypothyroidism   . Lung cancer ([place]) dx'd 12/2010   last Ct scan "no lung cancer" showing 11'16 CT Chest Epic.  . Malignant neoplasm of ascending colon ([place]) 01/29/2016  . Stroke [facility])    2 yrs ago-no residual  . Tubular adenoma of colon 07/2011   colon polyps ans reoccurence with malignancy found    ALLERGIES:  is allergic to oxycodone, doxycycline, and sulfa antibiotics.  MEDICATIONS:  Current Outpatient Medications  Medication Sig Dispense Refill  . acetaminophen (TYLENOL) 500 MG tablet Take 1,000 mg by mouth in the morning and at bedtime.    Marland Kitchen amLODipine (NORVASC) 10 MG tablet Take 1 tablet (10 mg total) by mouth daily. 30 tablet 0  . brimonidine-timolol (COMBIGAN) 0.2-0.5 % ophthalmic solution Place 1 drop into the right eye every 12 (twelve) hours.    . clopidogrel (PLAVIX) 75 MG  tablet Take 75 mg by mouth daily.    . cycloSPORINE (RESTASIS) 0.05 % ophthalmic emulsion Place 1 drop into both eyes 2 (two) times daily.    Marland Kitchen escitalopram (LEXAPRO) 10 MG tablet Take 10 mg by mouth daily.     . famotidine (PEPCID) 20 MG tablet Take 40 mg by mouth 2 (two) times daily.    . feeding supplement, ENSURE ENLIVE, (ENSURE ENLIVE) LIQD Take 237 mLs by mouth 2 (two) times daily between meals. 237 mL 12  . FeFum-FePoly-FA-B Cmp-C-Biot (FOLIVANE-PLUS) CAPS TAKE 1 CAPSULE BY MOUTH EVERY DAY IN THE MORNING 90 capsule 2  .  furosemide (LASIX) 20 MG tablet Take 10 mg by mouth daily.     Marland Kitchen letrozole (FEMARA) 2.5 MG tablet TAKE 1 TABLET BY MOUTH EVERY DAY 90 tablet 1  . lisinopril (ZESTRIL) 10 MG tablet Take 1 tablet (10 mg total) by mouth daily. 90 tablet 1  . Multiple Vitamins-Iron (MULTIVITAMINS WITH IRON) TABS tablet Take 1 tablet by mouth daily.  0  . Multiple Vitamins-Minerals (PRESERVISION AREDS 2) CAPS Take 1 capsule by mouth in the morning and at bedtime.     Marland Kitchen osimertinib mesylate (TAGRISSO) 80 MG tablet Take 1 tablet (80 mg total) by mouth daily. 30 tablet 3  . prochlorperazine (COMPAZINE) 10 MG tablet TAKE 1 TABLET (10 MG TOTAL) BY MOUTH EVERY 6 (SIX) HOURS AS NEEDED FOR NAUSEA OR VOMITING. 30 tablet 0  . rivaroxaban (XARELTO) 20 MG TABS tablet Take 20 mg by mouth daily with supper.    . traMADol HCl 100 MG TABS Take 100 mg by mouth 3 (three) times daily as needed. 60 tablet 0   Current Facility-Administered Medications  Medication Dose Route Frequency Provider Last Rate Last Admin  . 0.9 %  sodium chloride infusion  500 mL Intravenous Continuous Armbruster, Carlota Raspberry, MD        SURGICAL HISTORY:  Past Surgical History:  Procedure Laterality Date  . APPENDECTOMY    . BOWEL DECOMPRESSION N/A 06/12/2018   Procedure: BOWEL DECOMPRESSION;  Surgeon: Rush Landmark Telford Nab., MD;  Location: [facility];  Service: Gastroenterology;  Laterality: N/A;  . cataracts Bilateral   . COLECTOMY WITH COLOSTOMY CREATION/HARTMANN PROCEDURE  06/16/2018   Procedure: CREATION/HARTMANN COLOSTOMY;  Surgeon: Michael Boston, MD;  Location: [facility];  Service: General;;  . COLONOSCOPY    . COLOSTOMY N/A 06/16/2018   Procedure: POSSIBLE COLOSTOMY;  Surgeon: Michael Boston, MD;  Location: [facility];  Service: General;  Laterality: N/A;  . EYE SURGERY    . FLEXIBLE SIGMOIDOSCOPY N/A 06/12/2018   Procedure: FLEXIBLE SIGMOIDOSCOPY;  Surgeon: Rush Landmark Telford Nab., MD;  Location: [facility];  Service: Gastroenterology;  Laterality: N/A;   . LAPAROSCOPIC SIGMOID COLECTOMY N/A 06/16/2018   Procedure: LAPAROSCOPIC LOW ANTERIOR RESECTION;  Surgeon: Michael Boston, MD;  Location: [facility];  Service: General;  Laterality: N/A;  . LUNG SURGERY     Resection -" not done. Pt . tx with Tarceva  . MASTECTOMY Bilateral   . MEDIASTINOSCOPY N/A 10/02/2016   Procedure: MEDIASTINOSCOPY;  Surgeon: [name], MD;  Location: [facility] OR;  Service: Thoracic;  Laterality: N/A;  . RECONSTRUCTION BREAST W/ TRAM FLAP Bilateral    bilaterally  . robotic right hemicolectomy Right 03/06/2016   Dr. Johney Maine  . TEE WITHOUT CARDIOVERSION N/A 06/04/2013   Procedure: TRANSESOPHAGEAL ECHOCARDIOGRAM (TEE);  Surgeon: Jolaine Artist, MD;  Location: [facility] ENDOSCOPY;  Service: Cardiovascular;  Laterality: N/A;  . TONSILLECTOMY    . TOTAL HIP ARTHROPLASTY Left 08/07/2016  Procedure: LEFT TOTAL HIP ARTHROPLASTY ANTERIOR APPROACH;  Surgeon: Gaynelle Arabian, MD;  Location: [facility];  Service: Orthopedics;  Laterality: Left;  . TOTAL HIP ARTHROPLASTY Right 09/01/2020   Procedure: TOTAL HIP ARTHROPLASTY ANTERIOR APPROACH;  Surgeon: Rod Can, MD;  Location: [facility];  Service: Orthopedics;  Laterality: Right;    REVIEW OF SYSTEMS:  Constitutional: positive for anorexia, fatigue and weight loss Eyes: negative Ears, nose, mouth, throat, and face: negative Respiratory: positive for dyspnea on exertion and pleurisy/chest pain Cardiovascular: negative Gastrointestinal: negative Genitourinary:negative Integument/breast: negative Hematologic/lymphatic: negative Musculoskeletal:positive for arthralgias and muscle weakness Neurological: negative Behavioral/Psych: negative Endocrine: negative Allergic/Immunologic: negative   PHYSICAL EXAMINATION: General appearance: alert, cooperative, fatigued and no distress Head: Normocephalic, without obvious abnormality, atraumatic Neck: no adenopathy, no JVD, supple, symmetrical, trachea midline and thyroid not enlarged,  symmetric, no tenderness/mass/nodules Lymph nodes: Cervical, supraclavicular, and axillary nodes normal. Resp: diminished breath sounds LLL and dullness to percussion LLL Back: symmetric, no curvature. ROM normal. No CVA tenderness. Cardio: regular rate and rhythm, S1, S2 normal, no murmur, click, rub or gallop GI: soft, non-tender; bowel sounds normal; no masses,  no organomegaly Extremities: extremities normal, atraumatic, no cyanosis or edema Neurologic: Alert and oriented X 3, normal strength and tone. Normal symmetric reflexes. Normal coordination and gait  ECOG PERFORMANCE STATUS: 1 - Symptomatic but completely ambulatory  Blood pressure 136/66, pulse 95, temperature (!) 97.5 F (36.4 C), temperature source Tympanic, resp. rate 15, height 5' 9"  (1.753 m), weight 106 lb 14.4 oz (48.5 kg), SpO2 100 %.  LABORATORY DATA: Lab Results  Component Value Date   WBC 6.0 01/05/2021   HGB 12.1 01/05/2021   HCT 38.3 01/05/2021   MCV 93.6 01/05/2021   PLT 352 01/05/2021      Chemistry      Component Value Date/Time   NA 138 01/05/2021 1343   NA 138 09/26/2017 0942   K 4.8 01/05/2021 1343   K 5.2 No visable hemolysis (H) 09/26/2017 0942   CL 105 01/05/2021 1343   CL 107 04/05/2013 0754   CO2 27 01/05/2021 1343   CO2 26 09/26/2017 0942   BUN 24 (H) 01/05/2021 1343   BUN 16.0 09/26/2017 0942   CREATININE 1.15 (H) 01/05/2021 1343   CREATININE 1.2 (H) 09/26/2017 0942      Component Value Date/Time   CALCIUM 10.4 (H) 01/05/2021 1343   CALCIUM 9.9 09/26/2017 0942   ALKPHOS 172 (H) 01/05/2021 1343   ALKPHOS 68 09/26/2017 0942   AST 15 01/05/2021 1343   AST 18 09/26/2017 0942   ALT 12 01/05/2021 1343   ALT 13 09/26/2017 0942   BILITOT 0.3 01/05/2021 1343   BILITOT 0.31 09/26/2017 0942       RADIOGRAPHIC STUDIES: CT Chest W Contrast  Result Date: 01/07/2021 CLINICAL DATA:  Non-small-cell lung cancer. Restaging. History of bilateral breast cancer and colon cancer. Weight loss.  Shortness of breath. EXAM: CT CHEST, ABDOMEN, AND PELVIS WITH CONTRAST TECHNIQUE: Multidetector CT imaging of the chest, abdomen and pelvis was performed following the standard protocol during bolus administration of intravenous contrast. CONTRAST:  44m OMNIPAQUE IOHEXOL 300 MG/ML  SOLN COMPARISON:  10/10/2020. FINDINGS: CT CHEST FINDINGS Cardiovascular: The heart size is normal. No substantial pericardial effusion. Atherosclerotic calcification is noted in the wall of the thoracic aorta. Mediastinum/Nodes: Multinodular thyroid gland again noted. This has been evaluated on previous imaging. (ref: J Am Coll Radiol. 2015 Feb;12(2): 143-50).No mediastinal lymphadenopathy. There is no hilar lymphadenopathy. Large hiatal hernia with 50-75% of the stomach  in the chest. There is no axillary lymphadenopathy. Lungs/Pleura: Surgical scarring again noted left lung apex. Interval progression of patchy ground-glass and consolidative opacity in the left lower lobe including adjacent nodular densities visible on image 64/7 measuring approximately 7 mm each. The ground-glass and consolidative opacity has become more confluent towards the left base including posterior lingula. Areas of peripheral patchy ground-glass and subsolid airspace disease in the right lung are stable to mildly progressive in the interval. Loculated pleural fluid/thickening in the posterior left costophrenic sulcus is stable. Nodular pleural thickening anteriorly on the left (33/2) is unchanged. Musculoskeletal: Sclerotic focus left T8 transverse process is stable. No worrisome lytic or sclerotic osseous abnormality. CT ABDOMEN PELVIS FINDINGS Hepatobiliary: Stable right hepatic cysts with tiny low-density left liver lesion too small to characterize. Small area of low attenuation in the anterior liver, adjacent to the falciform ligament, is in a characteristic location for focal fatty deposition. There is no evidence for gallstones, gallbladder wall  thickening, or pericholecystic fluid. No intrahepatic or extrahepatic biliary dilation. Pancreas: No main duct dilatation. 11 mm cystic lesion in the uncinate process is similar to prior. No peripancreatic edema. Spleen: No splenomegaly. No focal mass lesion. Adrenals/Urinary Tract: No adrenal nodule or mass. Kidneys unremarkable. No evidence for hydroureter. Bladder decompressed and largely obscured by beam hardening artifact from bilateral hip replacement. Stomach/Bowel: Large hiatal hernia. Duodenum is normally positioned as is the ligament of Treitz. No small bowel wall thickening. No small bowel dilatation. Neither the terminal ileum nor the appendix are evident. Left lower quadrant end colostomy again noted. Vascular/Lymphatic: There is abdominal aortic atherosclerosis without aneurysm. There is no gastrohepatic or hepatoduodenal ligament lymphadenopathy. No retroperitoneal or mesenteric lymphadenopathy. No pelvic sidewall lymphadenopathy. Reproductive: Calcified fibroids noted.  There is no adnexal mass. Other: No intraperitoneal free fluid. Musculoskeletal: Bilateral hip replacement. Diffuse osteopenia. No worrisome lytic or sclerotic osseous abnormality. IMPRESSION: 1. Interval continued progression of ground-glass and consolidative opacity in the left lower lobe with new nodular densities measuring approximately 7 mm each. Imaging features raise concern for recurrent/progressive disease although infectious/inflammatory etiology also possibility. 2. Similar appearance of loculated pleural fluid/thickening in the posterior left costophrenic sulcus with stable nodular pleural thickening anteriorly on the left. 3. Patchy areas of peripheral ground-glass/subsolid opacity in the right lung are minimally progressive. Infectious/inflammatory etiology is a consideration although drug toxicity not excluded. 4. Large hiatal hernia with 50-75% of the stomach in the chest. 5. Stable 11 mm cystic lesion in the uncinate  process of the pancreas. Attention on follow-up recommended. 6. Aortic Atherosclerosis (ICD10-I70.0). Electronically Signed   By: Misty Stanley M.D.   On: 01/07/2021 07:23   CT Abdomen Pelvis W Contrast  Result Date: 01/07/2021 CLINICAL DATA:  Non-small-cell lung cancer. Restaging. History of bilateral breast cancer and colon cancer. Weight loss. Shortness of breath. EXAM: CT CHEST, ABDOMEN, AND PELVIS WITH CONTRAST TECHNIQUE: Multidetector CT imaging of the chest, abdomen and pelvis was performed following the standard protocol during bolus administration of intravenous contrast. CONTRAST:  62m OMNIPAQUE IOHEXOL 300 MG/ML  SOLN COMPARISON:  10/10/2020. FINDINGS: CT CHEST FINDINGS Cardiovascular: The heart size is normal. No substantial pericardial effusion. Atherosclerotic calcification is noted in the wall of the thoracic aorta. Mediastinum/Nodes: Multinodular thyroid gland again noted. This has been evaluated on previous imaging. (ref: J Am Coll Radiol. 2015 Feb;12(2): 143-50).No mediastinal lymphadenopathy. There is no hilar lymphadenopathy. Large hiatal hernia with 50-75% of the stomach in the chest. There is no axillary lymphadenopathy. Lungs/Pleura: Surgical scarring again noted left  lung apex. Interval progression of patchy ground-glass and consolidative opacity in the left lower lobe including adjacent nodular densities visible on image 64/7 measuring approximately 7 mm each. The ground-glass and consolidative opacity has become more confluent towards the left base including posterior lingula. Areas of peripheral patchy ground-glass and subsolid airspace disease in the right lung are stable to mildly progressive in the interval. Loculated pleural fluid/thickening in the posterior left costophrenic sulcus is stable. Nodular pleural thickening anteriorly on the left (33/2) is unchanged. Musculoskeletal: Sclerotic focus left T8 transverse process is stable. No worrisome lytic or sclerotic osseous  abnormality. CT ABDOMEN PELVIS FINDINGS Hepatobiliary: Stable right hepatic cysts with tiny low-density left liver lesion too small to characterize. Small area of low attenuation in the anterior liver, adjacent to the falciform ligament, is in a characteristic location for focal fatty deposition. There is no evidence for gallstones, gallbladder wall thickening, or pericholecystic fluid. No intrahepatic or extrahepatic biliary dilation. Pancreas: No main duct dilatation. 11 mm cystic lesion in the uncinate process is similar to prior. No peripancreatic edema. Spleen: No splenomegaly. No focal mass lesion. Adrenals/Urinary Tract: No adrenal nodule or mass. Kidneys unremarkable. No evidence for hydroureter. Bladder decompressed and largely obscured by beam hardening artifact from bilateral hip replacement. Stomach/Bowel: Large hiatal hernia. Duodenum is normally positioned as is the ligament of Treitz. No small bowel wall thickening. No small bowel dilatation. Neither the terminal ileum nor the appendix are evident. Left lower quadrant end colostomy again noted. Vascular/Lymphatic: There is abdominal aortic atherosclerosis without aneurysm. There is no gastrohepatic or hepatoduodenal ligament lymphadenopathy. No retroperitoneal or mesenteric lymphadenopathy. No pelvic sidewall lymphadenopathy. Reproductive: Calcified fibroids noted.  There is no adnexal mass. Other: No intraperitoneal free fluid. Musculoskeletal: Bilateral hip replacement. Diffuse osteopenia. No worrisome lytic or sclerotic osseous abnormality. IMPRESSION: 1. Interval continued progression of ground-glass and consolidative opacity in the left lower lobe with new nodular densities measuring approximately 7 mm each. Imaging features raise concern for recurrent/progressive disease although infectious/inflammatory etiology also possibility. 2. Similar appearance of loculated pleural fluid/thickening in the posterior left costophrenic sulcus with stable  nodular pleural thickening anteriorly on the left. 3. Patchy areas of peripheral ground-glass/subsolid opacity in the right lung are minimally progressive. Infectious/inflammatory etiology is a consideration although drug toxicity not excluded. 4. Large hiatal hernia with 50-75% of the stomach in the chest. 5. Stable 11 mm cystic lesion in the uncinate process of the pancreas. Attention on follow-up recommended. 6. Aortic Atherosclerosis (ICD10-I70.0). Electronically Signed   By: Misty Stanley M.D.   On: 01/07/2021 07:23    ASSESSMENT AND PLAN:  This is a very pleasant 80 years old white female with metastatic non-small cell lung cancer, adenocarcinoma diagnosed in March 2012 with positive EGFR mutation status post 68 months of treatment with Tarceva discontinued secondary to disease progression and development of EGFR T790M resistant mutation. The patient is currently on treatment with Tagrisso 80 mg by mouth daily status post 52 months.   The patient has been tolerating this treatment well with no concerning adverse effects but recently she started having increasing fatigue and weakness as well as weight loss and lack of appetite. She had repeat CT scan of the chest, abdomen pelvis performed recently.  I personally and independently reviewed the scan images and discussed the result with the patient and her daughter. Unfortunately her scan showed continued progression of the groundglass and consolidative opacity of the left lower lobe with new nodular density raising concern for recurrent/progressive disease.  She also  has similar appearance of the loculated pleural fluid/thickening of the posterior left costophrenic sulcus and patchy areas of peripheral groundglass/subsolid opacity in the right lung minimally progressive and suspicious for inflammatory process.  The patient also had a large hiatal hernia with 50-75% of the stomach in the chest. I had a lengthy discussion with the patient and her daughter  about her condition and further treatment options.  I gave the patient the option of continuous treatment with Tagrisso and close monitoring versus adding systemic chemotherapy with carboplatin and Al[place] but the patient has no interest in systemic chemotherapy and she would like to stay on [place] for now.  We also discussed the option of palliative care and hospice but she would like to hold on this option for now. For the hiatal hernia I am not sure if she will be a surgical candidate for fixation of the hernia but will reach out to her gastroenterologist Dr. Havery Moros to see if he has any additional recommendation. For pain management the patient will continue her current treatment with tramadol and Tylenol. For the dehydration and lack of appetite, will arrange for the patient to receive 1 L of normal saline in the clinic today. For the anemia of neoplastic disease, she will continue on the oral iron tablet for now. For the history of colon cancer she is currently on observation. For the history of breast cancer she is currently on treatment with Femara. The patient will come back for follow-up visit in 1 months for evaluation and repeat blood work. She was advised to call immediately if she has any other concerning symptoms in the interval.  The patient voices understanding of current disease status and treatment options and is in agreement with the current care plan. All questions were answered. The patient knows to call the clinic with any problems, questions or concerns. We can certainly see the patient much sooner if necessary.  Disclaimer: This note was dictated with voice recognition software. Similar sounding words can inadvertently be transcribed and may not be corrected upon review.

## 2021-01-12 ENCOUNTER — Ambulatory Visit (INDEPENDENT_AMBULATORY_CARE_PROVIDER_SITE_OTHER): Payer: Medicare Other | Admitting: Nurse Practitioner

## 2021-01-12 ENCOUNTER — Encounter: Payer: Self-pay | Admitting: Nurse Practitioner

## 2021-01-12 VITALS — BP 124/68 | HR 64 | Ht 69.0 in | Wt 107.0 lb

## 2021-01-12 DIAGNOSIS — K449 Diaphragmatic hernia without obstruction or gangrene: Secondary | ICD-10-CM

## 2021-01-12 DIAGNOSIS — Z85038 Personal history of other malignant neoplasm of large intestine: Secondary | ICD-10-CM

## 2021-01-12 NOTE — Progress Notes (Signed)
ASSESSMENT AND PLAN    # 80 year old female with a large hiatal hernia.  Recent CT scan shows the majority of her stomach sitting in her chest.   --It is unlikely that patient would be considered for surgery.  Except for diminished appetite she is fortunately not having any reflux symptoms on famotidine.  She has no dysphagia, abdominal pain or chest pain.    # History of ascending colon cancer status post resection in 2017.  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 The colostomy has not been reversed.  In July we sent patient a letter to make an office visit to discuss surveillance colonoscopy.  --I will discuss the case with Dr. Havery Moros but patient is not a great candidate for surveillance colonoscopy.  She is undergoing treatment for lung cancer which based on recent CT scan seems to be progressive. She has a large hiatal hernia with most of her stomach sitting in her chest, she is on Xarelto for atrial fibrillation and takes Plavix for history of CVA.   HISTORY OF PRESENT ILLNESS    Chief Complaint : Discuss large hiatal hernia  Kristin [name] is a 80 y.o. female known to Dr. Havery Moros with a past medical history of non-small cell lung cancer on Tagrisso, breast cancer, colon cancer in 2017 status post resection, PAF on Xarelto, Pulmonary embolism, hypertension, history of CVA on Plavix  History of colon cancer  She has a history of ascending colon cancer diagnosed in 2017, status post resection.   We sent her letter July 2021 requesting office visit to discuss surveillance colonoscopy.   Rectosigmoid stricture status post and colostomy in 2019.  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  Patient says there were plans to reverse the colostomy a couple of years ago but during that time she needed to take care of of her son who had terminal cancer.  Her son  unfortunately did pass away, patient has not been back to see Dr. Johney Maine who did her surgery.   Metastatic non-small cell lung cancer currently undergoing treatment with Dr. Julien Nordmann.  Her recent scan showed continued progression of disease in her lung. At her last visit with Dr. Julien Nordmann on 01/09/2021 it was decided that she would continue Tagrisso, she was not interested in chemotherapy.  She wanted to hold off on talking with palliative care or hospice.   Large hiatal hernia. recent CT scan showed a large hiatal hernia with 50 to 75% of her stomach being in the chest.  Dr. Julien Nordmann referred patient over to discuss the large hiatal hernia.  Patient has a lack of appetite but fortunately she has no GERD symptoms, dysphagia, vomiting or abdominal pain.  Husband prepares shakes for her and she drinks at least 1 can of Ensure a day.  Her weight is down 10 pounds just since early February.    DATA REVIEWED: 01/05/2021 Creatinine 1.15, calcium 10.4, alkaline phosphatase 172 liver chemistries otherwise normal CBC normal  01/05/2021 CT chest, abdomen, and pelvis IMPRESSION: 1. Interval continued progression of ground-glass and consolidative opacity in the left lower lobe with new nodular densities measuring approximately 7 mm each. Imaging features raise concern for recurrent/progressive disease although infectious/inflammatory etiology also possibility. 2. Similar appearance of loculated pleural fluid/thickening in the posterior left costophrenic sulcus with stable nodular pleural thickening anteriorly on the left. 3. Patchy areas of peripheral ground-glass/subsolid opacity in the right lung are minimally progressive. Infectious/inflammatory etiology is  a consideration although drug toxicity not excluded. 4. Large hiatal hernia with 50-75% of the stomach in the chest. 5. Stable 11 mm cystic lesion in the uncinate process of the pancreas. Attention on follow-up recommended. 6. Aortic Atherosclerosis  (ICD10-I70.0).    PREVIOUS ENDOSCOPIC EVALUATIONS / PERTINENT STUDIES:   March 2017 EGD for iron deficiency anemia and heme positive stool  --9 cm hiatal hernia, no obvious Cameron's lesions, esophagus otherwise normal.  --Stomach normal, biopsies taken for H. Pylori --duodenal bulb and second portion of the duodenum were normal    Past Medical History:  Diagnosis Date  . Allergy   . Anemia   . Anemia of chronic disease 09/21/2016  . Anxiety   . Arthritis    arthritis- left hip  . Blood transfusion without reported diagnosis    transfusion- 3-4 yrs ago -found to be anemic on routine lab check  . Breast cancer ([place]) 2002   bilateral- bilateral mastectomies done.  . Broken hip ([place])   . Chronic kidney disease   . 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 CVA (cerebral infarction) 04/30/2013  . Dysrhythmia   . Encounter for antineoplastic chemotherapy 10/28/2016  . Family history of brain cancer   . Family history of breast cancer   . Family history of colon cancer 10/06/2009  . Family history of colon cancer   . Family history of prostate cancer   . GERD (gastroesophageal reflux disease)   . Glaucoma   . History of bilateral mastectomy 05/20/2010  . Hypertension   . Hypothyroidism   . Lung cancer ([place]) dx'd 12/2010   last Ct scan "no lung cancer" showing 11'16 CT Chest Epic.  . Malignant neoplasm of ascending colon ([place]) 01/29/2016  . Stroke [facility])    2 yrs ago-no residual  . Tubular adenoma of colon 07/2011   colon polyps ans reoccurence with malignancy found    Current Medications, Allergies, Past Surgical History, Family History and Social History were reviewed in [employer] record.   Current Outpatient Medications  Medication Sig Dispense Refill  . acetaminophen (TYLENOL) 500 MG tablet Take 1,000 mg by mouth in the morning and at bedtime.    Marland Kitchen amLODipine (NORVASC) 10 MG tablet Take 1 tablet (10 mg total) by mouth daily. 30 tablet 0  .  brimonidine-timolol (COMBIGAN) 0.2-0.5 % ophthalmic solution Place 1 drop into the right eye every 12 (twelve) hours.    . clopidogrel (PLAVIX) 75 MG tablet Take 75 mg by mouth daily.    . cycloSPORINE (RESTASIS) 0.05 % ophthalmic emulsion Place 1 drop into both eyes 2 (two) times daily.    Marland Kitchen escitalopram (LEXAPRO) 10 MG tablet Take 10 mg by mouth daily.     . famotidine (PEPCID) 20 MG tablet Take 40 mg by mouth 2 (two) times daily.    . feeding supplement, ENSURE ENLIVE, (ENSURE ENLIVE) LIQD Take 237 mLs by mouth 2 (two) times daily between meals. 237 mL 12  . FeFum-FePoly-FA-B Cmp-C-Biot (FOLIVANE-PLUS) CAPS TAKE 1 CAPSULE BY MOUTH EVERY DAY IN THE MORNING 90 capsule 2  . letrozole (FEMARA) 2.5 MG tablet TAKE 1 TABLET BY MOUTH EVERY DAY 90 tablet 1  . Multiple Vitamins-Iron (MULTIVITAMINS WITH IRON) TABS tablet Take 1 tablet by mouth daily.  0  . Multiple Vitamins-Minerals (PRESERVISION AREDS 2) CAPS Take 1 capsule by mouth in the morning and at bedtime.     Marland Kitchen osimertinib mesylate (TAGRISSO) 80 MG tablet Take 1 tablet (80 mg total) by mouth daily. 30 tablet 3  .  prochlorperazine (COMPAZINE) 10 MG tablet TAKE 1 TABLET (10 MG TOTAL) BY MOUTH EVERY 6 (SIX) HOURS AS NEEDED FOR NAUSEA OR VOMITING. 30 tablet 0  . rivaroxaban (XARELTO) 20 MG TABS tablet Take 20 mg by mouth daily with supper.    . traMADol HCl 100 MG TABS Take 100 mg by mouth 3 (three) times daily as needed. 60 tablet 0  . lisinopril (ZESTRIL) 10 MG tablet Take 1 tablet (10 mg total) by mouth daily. 90 tablet 1   Current Facility-Administered Medications  Medication Dose Route Frequency Provider Last Rate Last Admin  . 0.9 %  sodium chloride infusion  500 mL Intravenous Continuous Armbruster, Carlota Raspberry, MD        Review of Systems: No chest pain. No shortness of breath. No urinary complaints.   PHYSICAL EXAM :    Wt Readings from Last 3 Encounters:  01/12/21 107 lb (48.5 kg)  01/09/21 106 lb 14.4 oz (48.5 kg)  11/27/20 117 lb  14.4 oz (53.5 kg)    BP 124/68   Pulse 64   Ht 5\' 9"  (1.753 m)   Wt 107 lb (48.5 kg)   BMI 15.80 kg/m  Constitutional: Frail appearing , pleasant frail appearing female in no acute distress. Psychiatric: Normal mood and affect. Behavior is normal. EENT: Pupils normal.  Conjunctivae are normal. No scleral icterus. Neck supple.  Cardiovascular: Normal rate, regular rhythm. No edema Pulmonary/chest: Effort normal and breath sounds normal. No wheezing, rales or rhonchi. Abdominal: Limited exam in wheelchair .Abdomen soft, nondistended, nontender. Bowel sounds active throughout.  Neurological: Alert and oriented to person place and time. Skin: Skin is warm and dry. No rashes noted.  I spent 30 minutes total reviewing records, obtaining history, performing exam, counseling patient and documenting visit / findings.   Tye Savoy, NP  01/12/2021, 1:39 PM  Cc:  Curt Bears, MD

## 2021-01-12 NOTE — Patient Instructions (Signed)
If you are age 82 or older, your body mass index should be between 23-30. Your Body mass index is 15.8 kg/m. If this is out of the aforementioned range listed, please consider follow up with your Primary Care Provider.  If you are age 86 or younger, your body mass index should be between 19-25. Your Body mass index is 15.8 kg/m. If this is out of the aformentioned range listed, please consider follow up with your Primary Care Provider.   Follow up as needed.  Thank you for entrusting me with your care and choosing [facility].  Tye Savoy, NP-C

## 2021-01-12 NOTE — Progress Notes (Signed)
Agree with assessment and plan as outlined.  Given her progressive lung cancer, cardiopulmonary issues on chronic Xarelto and history of Plavix for CVA, I think the risks of further surveillance colonoscopy probably outweigh the benefits it would not recommend doing a colonoscopy if she is otherwise feeling okay without symptoms.  Her CT scan showed no abnormality of her colon.    Re: Hiatal hernia, she is higher risk for surgery and would not recommend any elective surgery for this if she is asymptomatic.  She can use PPI as needed for reflux associated with this, etc.

## 2021-01-22 ENCOUNTER — Telehealth: Payer: Self-pay | Admitting: Nurse Practitioner

## 2021-01-22 NOTE — Telephone Encounter (Signed)
Patient calling to follow up on previous visit and what the next steps will be. Please advise.

## 2021-01-22 NOTE — Telephone Encounter (Signed)
I reviewed the notes from office visit on 01/12/21 from Dr. Havery Moros with the patient. All questions were answered.  She will call back for additional questions or new GI concerns.

## 2021-01-23 ENCOUNTER — Other Ambulatory Visit: Payer: BLUE CROSS/BLUE SHIELD

## 2021-01-23 ENCOUNTER — Encounter: Payer: Self-pay | Admitting: Internal Medicine

## 2021-01-23 ENCOUNTER — Other Ambulatory Visit: Payer: Self-pay | Admitting: Internal Medicine

## 2021-01-25 ENCOUNTER — Ambulatory Visit: Payer: BLUE CROSS/BLUE SHIELD | Admitting: Internal Medicine

## 2021-02-06 ENCOUNTER — Inpatient Hospital Stay: Payer: Medicare Other

## 2021-02-06 ENCOUNTER — Inpatient Hospital Stay: Payer: Medicare Other | Admitting: Internal Medicine

## 2021-03-21 DEATH — deceased

## 2021-08-24 ENCOUNTER — Other Ambulatory Visit: Payer: Self-pay | Admitting: Nurse Practitioner

## 2023-02-07 NOTE — Progress Notes (Signed)
UPDATE [id number] [id number] ([id number]) VUS has been reclassified as Likely Benign.  The amended report date is January 29, 2023.
